# Patient Record
Sex: Female | Born: 1935
Health system: Southern US, Community
[De-identification: ages and names within clinical notes are randomized; demographics above are authoritative.]

## PROBLEM LIST (undated history)

## (undated) DIAGNOSIS — F32A Depression, unspecified: Secondary | ICD-10-CM

## (undated) DIAGNOSIS — I82409 Acute embolism and thrombosis of unspecified deep veins of unspecified lower extremity: Secondary | ICD-10-CM

## (undated) DIAGNOSIS — C229 Malignant neoplasm of liver, not specified as primary or secondary: Secondary | ICD-10-CM

## (undated) DIAGNOSIS — I959 Hypotension, unspecified: Secondary | ICD-10-CM

## (undated) DIAGNOSIS — E079 Disorder of thyroid, unspecified: Secondary | ICD-10-CM

## (undated) DIAGNOSIS — T4145XA Adverse effect of unspecified anesthetic, initial encounter: Secondary | ICD-10-CM

## (undated) DIAGNOSIS — F419 Anxiety disorder, unspecified: Secondary | ICD-10-CM

## (undated) DIAGNOSIS — G459 Transient cerebral ischemic attack, unspecified: Secondary | ICD-10-CM

## (undated) DIAGNOSIS — I351 Nonrheumatic aortic (valve) insufficiency: Secondary | ICD-10-CM

## (undated) DIAGNOSIS — I639 Cerebral infarction, unspecified: Secondary | ICD-10-CM

## (undated) DIAGNOSIS — E785 Hyperlipidemia, unspecified: Secondary | ICD-10-CM

## (undated) DIAGNOSIS — I319 Disease of pericardium, unspecified: Secondary | ICD-10-CM

## (undated) DIAGNOSIS — K317 Polyp of stomach and duodenum: Secondary | ICD-10-CM

## (undated) DIAGNOSIS — I2699 Other pulmonary embolism without acute cor pulmonale: Secondary | ICD-10-CM

## (undated) DIAGNOSIS — T8859XA Other complications of anesthesia, initial encounter: Secondary | ICD-10-CM

## (undated) DIAGNOSIS — Z8679 Personal history of other diseases of the circulatory system: Secondary | ICD-10-CM

## (undated) DIAGNOSIS — E7800 Pure hypercholesterolemia, unspecified: Secondary | ICD-10-CM

## (undated) DIAGNOSIS — R270 Ataxia, unspecified: Secondary | ICD-10-CM

## (undated) DIAGNOSIS — K146 Glossodynia: Secondary | ICD-10-CM

## (undated) DIAGNOSIS — C3490 Malignant neoplasm of unspecified part of unspecified bronchus or lung: Secondary | ICD-10-CM

## (undated) DIAGNOSIS — G40909 Epilepsy, unspecified, not intractable, without status epilepticus: Secondary | ICD-10-CM

## (undated) DIAGNOSIS — IMO0001 Reserved for inherently not codable concepts without codable children: Secondary | ICD-10-CM

## (undated) DIAGNOSIS — Z5111 Encounter for antineoplastic chemotherapy: Secondary | ICD-10-CM

## (undated) DIAGNOSIS — H269 Unspecified cataract: Secondary | ICD-10-CM

## (undated) DIAGNOSIS — S2220XA Unspecified fracture of sternum, initial encounter for closed fracture: Secondary | ICD-10-CM

## (undated) DIAGNOSIS — F329 Major depressive disorder, single episode, unspecified: Secondary | ICD-10-CM

## (undated) DIAGNOSIS — J9601 Acute respiratory failure with hypoxia: Secondary | ICD-10-CM

## (undated) DIAGNOSIS — C801 Malignant (primary) neoplasm, unspecified: Secondary | ICD-10-CM

## (undated) DIAGNOSIS — R001 Bradycardia, unspecified: Secondary | ICD-10-CM

## (undated) DIAGNOSIS — N390 Urinary tract infection, site not specified: Secondary | ICD-10-CM

## (undated) DIAGNOSIS — Z7189 Other specified counseling: Secondary | ICD-10-CM

## (undated) DIAGNOSIS — Z8774 Personal history of (corrected) congenital malformations of heart and circulatory system: Secondary | ICD-10-CM

## (undated) DIAGNOSIS — I480 Paroxysmal atrial fibrillation: Secondary | ICD-10-CM

## (undated) DIAGNOSIS — R269 Unspecified abnormalities of gait and mobility: Secondary | ICD-10-CM

## (undated) DIAGNOSIS — E039 Hypothyroidism, unspecified: Secondary | ICD-10-CM

## (undated) DIAGNOSIS — R569 Unspecified convulsions: Secondary | ICD-10-CM

## (undated) DIAGNOSIS — G319 Degenerative disease of nervous system, unspecified: Secondary | ICD-10-CM

## (undated) DIAGNOSIS — K589 Irritable bowel syndrome without diarrhea: Secondary | ICD-10-CM

## (undated) DIAGNOSIS — E669 Obesity, unspecified: Secondary | ICD-10-CM

## (undated) DIAGNOSIS — K219 Gastro-esophageal reflux disease without esophagitis: Secondary | ICD-10-CM

## (undated) DIAGNOSIS — J309 Allergic rhinitis, unspecified: Secondary | ICD-10-CM

## (undated) DIAGNOSIS — I495 Sick sinus syndrome: Secondary | ICD-10-CM

## (undated) DIAGNOSIS — M1990 Unspecified osteoarthritis, unspecified site: Secondary | ICD-10-CM

## (undated) HISTORY — DX: Paroxysmal atrial fibrillation: I48.0

## (undated) HISTORY — PX: APPENDECTOMY: SHX54

## (undated) HISTORY — DX: Allergic rhinitis, unspecified: J30.9

## (undated) HISTORY — DX: Major depressive disorder, single episode, unspecified: F32.9

## (undated) HISTORY — DX: Urinary tract infection, site not specified: N39.0

## (undated) HISTORY — DX: Gastro-esophageal reflux disease without esophagitis: K21.9

## (undated) HISTORY — DX: Disease of pericardium, unspecified: I31.9

## (undated) HISTORY — PX: COLONOSCOPY: SHX174

## (undated) HISTORY — DX: Unspecified cataract: H26.9

## (undated) HISTORY — DX: Encounter for antineoplastic chemotherapy: Z51.11

## (undated) HISTORY — DX: Depression, unspecified: F32.A

## (undated) HISTORY — DX: Cerebral infarction, unspecified: I63.9

## (undated) HISTORY — PX: UPPER GASTROINTESTINAL ENDOSCOPY: SHX188

## (undated) HISTORY — DX: Ataxia, unspecified: R27.0

## (undated) HISTORY — DX: Nonrheumatic aortic (valve) insufficiency: I35.1

## (undated) HISTORY — DX: Personal history of other diseases of the circulatory system: Z86.79

## (undated) HISTORY — DX: Other specified counseling: Z71.89

## (undated) HISTORY — DX: Malignant neoplasm of liver, not specified as primary or secondary: C22.9

## (undated) HISTORY — DX: Degenerative disease of nervous system, unspecified: G31.9

## (undated) HISTORY — DX: Unspecified convulsions: R56.9

## (undated) HISTORY — DX: Hyperlipidemia, unspecified: E78.5

## (undated) HISTORY — DX: Disorder of thyroid, unspecified: E07.9

## (undated) HISTORY — DX: Acute embolism and thrombosis of unspecified deep veins of unspecified lower extremity: I82.409

## (undated) HISTORY — DX: Malignant (primary) neoplasm, unspecified: C80.1

## (undated) HISTORY — DX: Unspecified osteoarthritis, unspecified site: M19.90

## (undated) HISTORY — DX: Other pulmonary embolism without acute cor pulmonale: I26.99

## (undated) HISTORY — DX: Anxiety disorder, unspecified: F41.9

## (undated) HISTORY — DX: Obesity, unspecified: E66.9

## (undated) HISTORY — DX: Unspecified fracture of sternum, initial encounter for closed fracture: S22.20XA

## (undated) HISTORY — DX: Malignant neoplasm of unspecified part of unspecified bronchus or lung: C34.90

## (undated) HISTORY — DX: Epilepsy, unspecified, not intractable, without status epilepticus: G40.909

## (undated) HISTORY — DX: Transient cerebral ischemic attack, unspecified: G45.9

## (undated) HISTORY — DX: Unspecified abnormalities of gait and mobility: R26.9

## (undated) HISTORY — DX: Hypotension, unspecified: I95.9

## (undated) HISTORY — PX: TOTAL ABDOMINAL HYSTERECTOMY: SHX209

## (undated) HISTORY — PX: OTHER SURGICAL HISTORY: SHX169

## (undated) HISTORY — PX: EYE SURGERY: SHX253

## (undated) HISTORY — DX: Bradycardia, unspecified: R00.1

## (undated) HISTORY — DX: Irritable bowel syndrome, unspecified: K58.9

## (undated) HISTORY — DX: Pure hypercholesterolemia, unspecified: E78.00

## (undated) HISTORY — PX: KNEE ARTHROSCOPY: SUR90

---

## 1999-11-10 ENCOUNTER — Encounter: Payer: Self-pay | Admitting: Emergency Medicine

## 1999-11-10 ENCOUNTER — Encounter: Payer: Self-pay | Admitting: Orthopedic Surgery

## 1999-11-10 ENCOUNTER — Inpatient Hospital Stay (HOSPITAL_COMMUNITY): Admission: EM | Admit: 1999-11-10 | Discharge: 1999-11-13 | Payer: Self-pay | Admitting: Emergency Medicine

## 1999-11-11 ENCOUNTER — Encounter: Payer: Self-pay | Admitting: Orthopedic Surgery

## 2001-07-30 DIAGNOSIS — R569 Unspecified convulsions: Secondary | ICD-10-CM

## 2001-07-30 HISTORY — DX: Unspecified convulsions: R56.9

## 2001-11-26 ENCOUNTER — Emergency Department (HOSPITAL_COMMUNITY): Admission: EM | Admit: 2001-11-26 | Discharge: 2001-11-26 | Payer: Self-pay | Admitting: Emergency Medicine

## 2001-11-28 ENCOUNTER — Ambulatory Visit (HOSPITAL_COMMUNITY): Admission: RE | Admit: 2001-11-28 | Discharge: 2001-11-28 | Payer: Self-pay | Admitting: *Deleted

## 2001-11-28 ENCOUNTER — Encounter: Payer: Self-pay | Admitting: *Deleted

## 2002-11-18 ENCOUNTER — Ambulatory Visit (HOSPITAL_COMMUNITY): Admission: RE | Admit: 2002-11-18 | Discharge: 2002-11-18 | Payer: Self-pay | Admitting: Family Medicine

## 2003-05-13 ENCOUNTER — Ambulatory Visit (HOSPITAL_COMMUNITY): Admission: RE | Admit: 2003-05-13 | Discharge: 2003-05-13 | Payer: Self-pay | Admitting: Family Medicine

## 2003-05-13 ENCOUNTER — Encounter: Payer: Self-pay | Admitting: Family Medicine

## 2003-05-28 ENCOUNTER — Emergency Department (HOSPITAL_COMMUNITY): Admission: EM | Admit: 2003-05-28 | Discharge: 2003-05-28 | Payer: Self-pay | Admitting: Emergency Medicine

## 2003-05-31 DIAGNOSIS — I639 Cerebral infarction, unspecified: Secondary | ICD-10-CM | POA: Insufficient documentation

## 2003-05-31 HISTORY — DX: Cerebral infarction, unspecified: I63.9

## 2003-06-03 ENCOUNTER — Ambulatory Visit (HOSPITAL_COMMUNITY): Admission: RE | Admit: 2003-06-03 | Discharge: 2003-06-03 | Payer: Self-pay | Admitting: Emergency Medicine

## 2003-06-21 ENCOUNTER — Ambulatory Visit (HOSPITAL_COMMUNITY): Admission: AD | Admit: 2003-06-21 | Discharge: 2003-06-21 | Payer: Self-pay | Admitting: Neurology

## 2003-09-23 ENCOUNTER — Inpatient Hospital Stay (HOSPITAL_COMMUNITY): Admission: EM | Admit: 2003-09-23 | Discharge: 2003-09-24 | Payer: Self-pay | Admitting: Emergency Medicine

## 2003-09-27 ENCOUNTER — Encounter: Admission: RE | Admit: 2003-09-27 | Discharge: 2003-09-27 | Payer: Self-pay | Admitting: Family Medicine

## 2004-06-16 ENCOUNTER — Ambulatory Visit: Payer: Self-pay | Admitting: Unknown Physician Specialty

## 2004-09-14 ENCOUNTER — Ambulatory Visit: Payer: Self-pay | Admitting: Internal Medicine

## 2004-09-25 ENCOUNTER — Ambulatory Visit: Payer: Self-pay | Admitting: Internal Medicine

## 2006-04-08 ENCOUNTER — Inpatient Hospital Stay (HOSPITAL_COMMUNITY): Admission: EM | Admit: 2006-04-08 | Discharge: 2006-04-14 | Payer: Self-pay | Admitting: Emergency Medicine

## 2006-06-24 ENCOUNTER — Ambulatory Visit: Payer: Self-pay | Admitting: Internal Medicine

## 2006-09-10 DIAGNOSIS — K219 Gastro-esophageal reflux disease without esophagitis: Secondary | ICD-10-CM | POA: Insufficient documentation

## 2006-09-10 DIAGNOSIS — J309 Allergic rhinitis, unspecified: Secondary | ICD-10-CM | POA: Insufficient documentation

## 2006-09-10 DIAGNOSIS — E785 Hyperlipidemia, unspecified: Secondary | ICD-10-CM

## 2006-09-10 DIAGNOSIS — I10 Essential (primary) hypertension: Secondary | ICD-10-CM

## 2006-10-09 ENCOUNTER — Ambulatory Visit: Payer: Self-pay | Admitting: Internal Medicine

## 2006-11-14 ENCOUNTER — Ambulatory Visit (HOSPITAL_COMMUNITY): Admission: RE | Admit: 2006-11-14 | Discharge: 2006-11-15 | Payer: Self-pay | Admitting: Orthopedic Surgery

## 2006-11-14 HISTORY — PX: KNEE ARTHROSCOPY: SHX127

## 2006-11-28 ENCOUNTER — Ambulatory Visit: Payer: Self-pay | Admitting: Vascular Surgery

## 2006-11-28 ENCOUNTER — Encounter: Payer: Self-pay | Admitting: Vascular Surgery

## 2006-11-28 ENCOUNTER — Ambulatory Visit (HOSPITAL_COMMUNITY): Admission: RE | Admit: 2006-11-28 | Discharge: 2006-11-28 | Payer: Self-pay | Admitting: Orthopedic Surgery

## 2006-12-16 ENCOUNTER — Encounter: Admission: RE | Admit: 2006-12-16 | Discharge: 2006-12-30 | Payer: Self-pay | Admitting: Orthopedic Surgery

## 2007-01-20 ENCOUNTER — Ambulatory Visit (HOSPITAL_COMMUNITY): Admission: RE | Admit: 2007-01-20 | Discharge: 2007-01-20 | Payer: Self-pay | Admitting: Family Medicine

## 2007-10-27 ENCOUNTER — Ambulatory Visit (HOSPITAL_BASED_OUTPATIENT_CLINIC_OR_DEPARTMENT_OTHER): Admission: RE | Admit: 2007-10-27 | Discharge: 2007-10-27 | Payer: Self-pay | Admitting: Orthopedic Surgery

## 2007-12-01 ENCOUNTER — Encounter: Admission: RE | Admit: 2007-12-01 | Discharge: 2008-02-03 | Payer: Self-pay | Admitting: Orthopedic Surgery

## 2008-02-13 ENCOUNTER — Telehealth: Payer: Self-pay | Admitting: Internal Medicine

## 2008-02-16 ENCOUNTER — Telehealth: Payer: Self-pay | Admitting: Internal Medicine

## 2008-02-18 ENCOUNTER — Ambulatory Visit: Payer: Self-pay | Admitting: Gastroenterology

## 2008-02-18 DIAGNOSIS — K589 Irritable bowel syndrome without diarrhea: Secondary | ICD-10-CM

## 2008-02-18 DIAGNOSIS — K644 Residual hemorrhoidal skin tags: Secondary | ICD-10-CM | POA: Insufficient documentation

## 2008-02-19 ENCOUNTER — Ambulatory Visit: Payer: Self-pay | Admitting: Internal Medicine

## 2008-02-19 LAB — CONVERTED CEMR LAB
Basophils Absolute: 0.2 10*3/uL — ABNORMAL HIGH (ref 0.0–0.1)
Basophils Relative: 3.6 % — ABNORMAL HIGH (ref 0.0–3.0)
Eosinophils Absolute: 0.3 10*3/uL (ref 0.0–0.7)
Eosinophils Relative: 6 % — ABNORMAL HIGH (ref 0.0–5.0)
HCT: 40.6 % (ref 36.0–46.0)
Hemoglobin: 13.8 g/dL (ref 12.0–15.0)
Lymphocytes Relative: 30.6 % (ref 12.0–46.0)
MCHC: 34 g/dL (ref 30.0–36.0)
MCV: 90.1 fL (ref 78.0–100.0)
Monocytes Absolute: 0.7 10*3/uL (ref 0.1–1.0)
Monocytes Relative: 12.7 % — ABNORMAL HIGH (ref 3.0–12.0)
Neutro Abs: 2.8 10*3/uL (ref 1.4–7.7)
Neutrophils Relative %: 47.1 % (ref 43.0–77.0)
Platelets: 244 10*3/uL (ref 150–400)
RBC: 4.5 M/uL (ref 3.87–5.11)
RDW: 13.3 % (ref 11.5–14.6)
WBC: 5.7 10*3/uL (ref 4.5–10.5)

## 2008-03-02 ENCOUNTER — Ambulatory Visit: Payer: Self-pay | Admitting: Internal Medicine

## 2008-03-03 ENCOUNTER — Telehealth: Payer: Self-pay | Admitting: Internal Medicine

## 2008-03-23 ENCOUNTER — Ambulatory Visit: Payer: Self-pay | Admitting: Internal Medicine

## 2008-04-06 ENCOUNTER — Encounter: Payer: Self-pay | Admitting: Internal Medicine

## 2008-04-06 ENCOUNTER — Ambulatory Visit: Payer: Self-pay | Admitting: Internal Medicine

## 2008-04-14 ENCOUNTER — Telehealth: Payer: Self-pay | Admitting: Internal Medicine

## 2009-12-16 ENCOUNTER — Encounter: Admission: RE | Admit: 2009-12-16 | Discharge: 2009-12-16 | Payer: Self-pay | Admitting: Family Medicine

## 2010-05-14 ENCOUNTER — Encounter: Admission: RE | Admit: 2010-05-14 | Discharge: 2010-05-14 | Payer: Self-pay | Admitting: Neurology

## 2010-11-21 ENCOUNTER — Ambulatory Visit: Payer: Medicare Other | Attending: Specialist | Admitting: Physical Therapy

## 2010-11-21 DIAGNOSIS — R5381 Other malaise: Secondary | ICD-10-CM | POA: Insufficient documentation

## 2010-11-21 DIAGNOSIS — M25569 Pain in unspecified knee: Secondary | ICD-10-CM | POA: Insufficient documentation

## 2010-11-21 DIAGNOSIS — M25669 Stiffness of unspecified knee, not elsewhere classified: Secondary | ICD-10-CM | POA: Insufficient documentation

## 2010-11-21 DIAGNOSIS — IMO0001 Reserved for inherently not codable concepts without codable children: Secondary | ICD-10-CM | POA: Insufficient documentation

## 2010-11-23 ENCOUNTER — Ambulatory Visit: Payer: Medicare Other | Admitting: *Deleted

## 2010-11-28 ENCOUNTER — Ambulatory Visit: Payer: Medicare Other | Admitting: Physical Therapy

## 2010-11-30 ENCOUNTER — Encounter: Admitting: Physical Therapy

## 2010-12-05 ENCOUNTER — Ambulatory Visit: Payer: Medicare Other | Attending: Specialist | Admitting: *Deleted

## 2010-12-05 DIAGNOSIS — IMO0001 Reserved for inherently not codable concepts without codable children: Secondary | ICD-10-CM | POA: Insufficient documentation

## 2010-12-05 DIAGNOSIS — R5381 Other malaise: Secondary | ICD-10-CM | POA: Insufficient documentation

## 2010-12-05 DIAGNOSIS — M25569 Pain in unspecified knee: Secondary | ICD-10-CM | POA: Insufficient documentation

## 2010-12-05 DIAGNOSIS — M25669 Stiffness of unspecified knee, not elsewhere classified: Secondary | ICD-10-CM | POA: Insufficient documentation

## 2010-12-08 ENCOUNTER — Encounter: Admitting: *Deleted

## 2010-12-09 ENCOUNTER — Emergency Department (HOSPITAL_COMMUNITY)
Admission: EM | Admit: 2010-12-09 | Discharge: 2010-12-09 | Disposition: A | Discharge status: Home / Self Care | Payer: Medicare Other | Attending: Emergency Medicine | Admitting: Emergency Medicine

## 2010-12-09 DIAGNOSIS — R569 Unspecified convulsions: Secondary | ICD-10-CM | POA: Insufficient documentation

## 2010-12-09 DIAGNOSIS — M7989 Other specified soft tissue disorders: Secondary | ICD-10-CM | POA: Insufficient documentation

## 2010-12-09 DIAGNOSIS — Z8673 Personal history of transient ischemic attack (TIA), and cerebral infarction without residual deficits: Secondary | ICD-10-CM | POA: Insufficient documentation

## 2010-12-09 DIAGNOSIS — M79609 Pain in unspecified limb: Secondary | ICD-10-CM | POA: Insufficient documentation

## 2010-12-12 NOTE — Op Note (Signed)
[name], [name]               ACCOUNT NO.:  [id number]   MEDICAL RECORD NO.:  [id number]          PATIENT TYPE:  AMB   LOCATION:  [facility]                         FACILITY:  [facility]   PHYSICIAN:  Marlowe Kays, M.D.  DATE OF BIRTH:  09/25/1933   DATE OF PROCEDURE:  10/27/2007  DATE OF DISCHARGE:                               OPERATIVE REPORT   PREOPERATIVE DIAGNOSIS:  Torn lateral meniscus, left knee.   POSTOPERATIVE DIAGNOSES:  1. Torn medial and lateral menisci, left knee  2. Osteoarthritis, left knee.   OPERATION:  Left knee arthroscopy with partial medial lateral  meniscectomy and shaving of medial femoral condyle.   SURGEON:  Dr. Simonne Come   ASSISTANT:  Nurse.   ANESTHESIA:  General.   PATHOLOGY/INDICATION FOR PROCEDURE:  She has had prior successful right  knee arthroscopic procedure.  She has had severe pain in the left knee  with an MRI demonstrating a complex tear lateral meniscus.  At surgery,  she was also found to have a substantial tear of the medial meniscus  involving the entire posterior third beginning at the posterior curve.  This was associated with some grade 2/4 chondromalacia of the medial  femoral condyle mainly posteriorly and significant scalloping of most of  the lateral tibial plateau with the lateral femoral condyle being  relatively spared.   PROCEDURE:  After satisfactory general anesthesia, Ace wrap and knee  support to right lower extremity, pneumatic tourniquet to left lower  extremity, left leg Esmarched out nonsterilely, thigh stabilizer  applied, and the leg prepped with DuraPrep from stabilizer to ankle and  draped in sterile field.  Time-out performed.  Superior medial saline  inflow.  First, through an anterolateral portal, medial compartment  joint was evaluated with the findings noted above.  I resected the torn  medial meniscus back to stable rim with a combination of baskets shaved  it down until smooth with a 3.5 shaver.  Final  picture was taken.  I  also debrided down the medial femoral condyle particularly on this  posterior portion and then reversed portals.  ACL was intact.  She had  severe maceration of the anterior third of the lateral meniscus which I  debrided out with a combination of arthroscopic scissors and 3.5 shaver  until I had better visualization.  She had tears of both the  intercondylar portion of the lateral meniscus, the posterior curve, and  a good bit of the anterior third.  I debrided back the posterior tears  with small baskets and then shaved down the entire lateral meniscus  until smooth.  Final pictures were taken.  Knee joint was irrigated  until clear, and all fluid possible was removed.  The two anterior  portals I closed with 4-0 nylon and then injected through the inflow  apparatus 20 mL 0.50% Marcaine with adrenaline and 4 mg of morphine.  This inflow apparatus I then removed,  and I closed this portal with 4-0 nylon as well.  Betadine Adaptic dry  dressing were applied.  Tourniquet was released.  She tolerated the  procedure well and was taken to recovery  room in satisfactory condition  with no known complications.           ______________________________  Marlowe Kays, M.D.     JA/MEDQ  D:  10/27/2007  T:  10/27/2007  Job:  [id number]

## 2010-12-15 NOTE — Op Note (Signed)
[name], PENDRY NO.:  [id number]   MEDICAL RECORD NO.:  [id number]          PATIENT TYPE:  OIB   LOCATION:  0098                         [facility]ACILITY:  [facility]   PHYSICIAN:  Marlowe Kays, M.D.  DATE OF BIRTH:  22-Oct-1933   DATE OF PROCEDURE:  11/14/2006  DATE OF DISCHARGE:                               OPERATIVE REPORT   PREOPERATIVE DIAGNOSIS:  1. Torn medial and lateral menisci.  2. Osteoarthritis of right knee.   POSTOPERATIVE DIAGNOSIS:  1. Torn medial and lateral menisci.  2. Osteoarthritis of right knee.   OPERATION:  Right knee arthroscopy with:  1. Partial medial and lateral meniscectomy.  2. Shaving of medial and lateral femoral condyles.   SURGEON:  Marlowe Kays, M.D.   ASSISTANT:  Nurse.   ANESTHESIA:  General.   JUSTIFICATION FOR PROCEDURE:  She has had chronic right knee pain,  worsening recently. Plain x-rays have demonstrated tricompartmental  degenerative changes but an MRI on June 11, 2006, has demonstrated  extensive tears involving both medial and lateral menisci.  The surgery  has been delayed because of irritable bowel problems requiring  antibiotic therapy.  When she was deemed ready for surgery by her  medical physician, Dr. Stan Head, who recommended prophylactic  antibiotics, she is here today and we will cover her with antibiotics  pre and postoperatively per Dr. Marvell Fuller recommendation and also keep  her in as an overnight evaluation for additional care.   PROCEDURE:  Prophylactic antibiotics.  She was given a knee block  initially by anesthesiologist, not wanting to put to sleep.  A pneumatic  tourniquet was applied but not inflated.  A thigh holder was applied to  the right leg. Ace wrap and knee support to the left lower extremity.  The right leg was prepped from the thigh support to ankle with DuraPrep  and draped in a sterile field. On trying to establish a superior and  medial portal, she did have some  pain and she was given a light  supplement to the knee block.  The operation from there on went  smoothly.   First, through an anterolateral portal, the medial compartment of the  knee joint was evaluated.  She had grade 3/4 chondromalacia of the  medial femoral condyle and some wear of the medial tibial plateau, as  well.  I debrided down the medial femoral condyle which allowed better  visualization.  The anterior portion of the medial meniscus I smoothed  down, it had been excoriated. Posteriorly, she had an extensive tear  particularly involving the posterior curve and just prior to it with a  little bit of wear over the posterior horn. I resected this back to a  stable rim with a combination of baskets and 3.5 shaver.   I then reversed portals laterally, visualization was poor initially, I  was able to use a 3.5 shaver to clear up some synovitis.  Her anterior  lateral meniscus was badly torn and I shaved this down. Her entire  meniscus was actually badly torn on further visualization posteriorly  and I debrided this down with  baskets and shaved down, again with 3.5  shaver, with the final meniscus rim being minimal, particularly in the  mid portion, but stable.  I then looked up in the lateral gutter and  suprapatellar area. She had some osteophytes from the patella but  nothing that was really shaveable.   The knee joint was then irrigated until clear. The portals were closed  with 4-0 nylon.  I then injected 20 mL of 0.5% Marcaine with adrenalin  through the inflow apparatus which was removed and this portal closed  with 4-0 nylon, as well.  A dry sterile dressing was applied.  She  tolerated the procedure well and was taken to recovery room in  satisfactory condition with no known complications.           ______________________________  Marlowe Kays, M.D.     JA/MEDQ  D:  11/14/2006  T:  11/15/2006  Job:  [id number]

## 2010-12-15 NOTE — H&P (Signed)
NAMEKYRIN, GARN NO.:  [id number]   MEDICAL RECORD NO.:  [id number]                   PATIENT TYPE:  INP   LOCATION:  3705                                 FACILITY:  [facility]   PHYSICIAN:  Nani Gasser, M.D.            DATE OF BIRTH:  18-Apr-1934   DATE OF ADMISSION:  09/23/2003  DATE OF DISCHARGE:  09/24/2003                                HISTORY & PHYSICAL   CHIEF COMPLAINT:  Larey Seat over while walking to the bathroom.   HISTORY OF PRESENT ILLNESS:  This is a 77 year old white female with a  history of right cerebellar infarct who had a single episode today for  approximately five minutes, followed by a contraction of the left arm.  The  patient apparently lost consciousness after hitting her head on the wall and  hit her tailbone on the floor.  She had approximately a two year history of  olfactory seizures associated with increased fatigue postictally.  She has  been seen by a neurologist and has never been on medications for this.  The  patient reports a decrease in her memory function recently, but denies any  personality changes, vocalized weaknesses or focalized decreased sensation.  The patient reports having an unsteady gait for approximately over a year.   PAST MEDICAL HISTORY:  1. CVA in November 2004.  She had a negative cerebral angiogram and no     carotid stenosis at that time.  She had an MRI in May 2003, showing     chronic infarction of the right cerebellum.  2. AVM on the left side of the face.  3. Hypercholesterolemia.  4. Questionable history of vaginal cysts removed 30 years ago.  5. History of chronic pericarditis.  6. History of heart murmur.   MEDICATIONS:  1. Elavil, unsure of dose.  2. Zantac p.r.n.  3. Aspirin 325 mg q.d.  4. Mevacor.   PAST SURGICAL HISTORY:  1. History of appendectomy.  2. History of hysterectomy.  3. History of left tibial and fibular internal fixation.   ALLERGIES:  No known drug  allergies.   SOCIAL HISTORY:  Lives in [place] with her husband, six children, eight  grandchildren and five great-grandchildren.  Denies any alcohol, tobacco or  drug use.  One of her daughters is present in the Emergency Department  today.   FAMILY HISTORY:  Father died at age 53 from coronary artery disease.  Mother  died at the age of 56.  Brother also died of coronary artery disease.  Has  one brother with diabetes.   REVIEW OF SYSTEMS:  Patient notes increasing eye blinking and worsening  short term memory.  She also notices more forgetfulness.  She denies any  personality changes.  She reports she feels dizzy all of the time and this  is not new.  She also reports decreased eye sight over the past year.  Denies any tinnitus or earache.  Has had a sore throat for the past week  with a head cold.  This a.m., she complained of pain with her breathing.  She denied any melanotic stools or bright red blood per rectum.  She does  have a positive history for constipation, but no dysuria, and denies any  abdominal pain currently.   VITAL SIGNS:  Temp 98.3, heart rate 74-85, blood pressure 140-143/62-63,  respirations 16-20.  O2 is 99% on room air.   PHYSICAL EXAMINATION:  GENERAL:  Alert and oriented x 3, in no acute  distress.  HEENT:  Head is atraumatic, normocephalic.  TMs are clear.  Oropharynx is  clear.  CARDIOVASCULAR:  Regular rate and rhythm with a question of a 2/6 systolic  murmur.  No rubs.  PULMONARY:  Clear to auscultation bilaterally.  No wheezes, no crackles, no  rhonchi.  ABDOMEN:  Positive tender in the left lower quadrant more so than the left  upper quadrant.  Moderately tender all over, though.  Positive bowel sounds,  nondistended.  EXTREMITIES:  No edema.  No cyanosis or clubbing.  LYMPH NODES:  No lymphadenopathy.  SKIN:  No rashes.  MUSCULOSKELETAL:  Full range of motion grossly.  NEURO:  Decreased sensation in the right mid cheek, otherwise cranial  nerves  II-XII are intact grossly and deep tendon reflexes 2+ bilaterally.  Strength  5/5 bilaterally.   LABORATORIES:  Sodium 143, potassium 3.7, chloride 111, bicarb 21, BUN 7,  creatinine 0.8, glucose 90, hemoglobin 15, hematocrit 44.  EKG is normal  sinus rhythm.  Chest x-ray:  No acute disease.  Head CT shows no acute  abnormalities.   AXIS OF COMPLAINTS:  This is 77 year old white female with a history of CVA  and olfactory seizures, admitted with syncope and a fall after hitting her  head and tailbone.   PROBLEM #1 - SYNCOPE:  Etiology is unclear at this point.  EKG appears  normal.  I must consider arrhythmias, so we will keep her on telemetry  overnight.  Consider anticholinergic effects of medication as a possibility  or vasovagal, but this is most likely, given her history.  Electrolytes and  glucose appear normal.  Also consider that this syncope may be secondary to  seizures, given her past history and the brief left arm contracture after  the fall. Stroke is much less likely with a negative CT.  The patient's  cerebellar function did reveal a positive Romberg and some difficulty with  walking, but this is also consistent with her old cerebellar infarct back in  May 2003 on MRI.  We will observe and check a chest x-ray and check  __________ in the morning.   PROBLEM #2 -  MUSCULOSKELETAL.  Will continue to follow for any evidence of  fracture.   PROBLEM #3 - NEURO:  Consider the possibility of seizures.  Patient with a  past history of neurologic workup by Neurology here in [place].  Will  plan to obtain a Neuro consult and appreciate their assistance and  consideration in helping with and especially starting this patient on  seizure medications.                                                Nani Gasser, M.D.    CM/MEDQ  D:  10/20/2003  T:  10/21/2003  Job:  [id number]  cc:  [facility]  [address]  [place], [place]  [address]

## 2010-12-15 NOTE — Procedures (Signed)
CLINICAL HISTORY:  The patient is a 77 year old woman who had a syncopal  episode twice without warning.  This has happened in the past.   PROCEDURE:  The procedure is carried out on a 32-channel digital Cadwell  recorder reformatted into 16-channel montages with one devoted to EKG.  The  patient was awake during the recording.  She takes aspirin, Vicodin, Pepcid,  Zocor.  The international 10-20 system lead placement was used.   DESCRIPTION OF FINDINGS:  The background is a very low voltage mixture of 8-  9 Hz alpha range activity and beta range components.  Occasional rhythmic  beta range components are seen.   The record shows some drowsiness with brief episodes of more generalized  theta range activity.  The patient was aroused for intermittent photic  stimulation which induced a driving response from 11 Hz up to 17 Hz.   There was no focal slowing of the background.  There was no interictal  epileptiform activity in the form of spikes or sharp waves.  Toward the end  of the record the patient hyperventilated with no significant change in  background activity.  EKG showed a regular sinus rhythm with ventricular  response of 66 beats per minute.   IMPRESSION:  Normal record with the patient awake and drowsy.    WILLIAM H. Sharene Skeans, M.D.   AOZ:HYQM  D:  09/24/2003 10:55:27  T:  09/24/2003 11:23:30  Job #:  [id number]   cc:   Melvyn Novas, M.D.  [address]  [address]  [place]  [place] [address]  Fax: [phone]

## 2010-12-15 NOTE — Discharge Summary (Signed)
[name], [name]               ACCOUNT NO.:  [id number]   MEDICAL RECORD NO.:  [id number]          PATIENT TYPE:  INP   LOCATION:  5736                         FACILITY:  [facility]   PHYSICIAN:  Melissa L. Ladona Ridgel, MD  DATE OF BIRTH:  Dec 06, 1933   DATE OF ADMISSION:  04/08/2006  DATE OF DISCHARGE:  04/14/2006                                 DISCHARGE SUMMARY   CHIEF COMPLAINT ON ADMISSION:  Fever, hallucinations, headaches, and body  aches.   DISCHARGE DIAGNOSES:  1. Urinary tract infection.  The patient was started initially on Cipro      intravenously q.12h.  A urinalysis was obtained, which showed coagulase-      negative Staphylococcus at about 6000 colony-forming units.  Because of      the diagnosis of diarrhea, which ended up being Clostridium difficile,      it was felt that the patient's fever and white count elevations were      related to Clostridium difficile colitis, and therefore with the      finding of only 6000 colony-forming units of coagulase-negative      Staphylococcus, the patient's Cipro was discontinued.  She was,      however, maintained on Flagyl for her Clostridium difficile infection.      The patient during the course of the hospital stay had difficulty with      a voiding trial.  Evidently the patient has a prolapsed bladder, which      impedes her urinary output.  The patient relates several weeks to      months of inability to completely and totally empty her urine from her      bladder.  She requires positional changes to assist with this but never      feels completely empty.  During the stay, her Foley was discontinued,      and 1400 ml of urine were drained after in-and-out catheterization when      the patient could not void.  I have spoken with the [facility],      who agreed to assign her to a physician.  Dr. Vonita Moss may or may not      be available, and therefore one of his colleagues would need to see      this patient in the outpatient  setting.  She will go home with a      urinary catheter and Home Healthcare to assist her with the care of the      catheter until she can be seen in the outpatient setting.  2. Seizure disorder.  The patient was seen and evaluated by Neurology      because of the hallucinations and an MRI was completed, which shows no      obvious acute infarction or intracranial abnormality.  She has an old      right cerebellar infarct, and either infarct or injury to the left      aspect of the splenium of the corpus callosum, which is unchanged from      previous studies.  She has slight progression in the white  matter      changes in her brain.  The patient's olfactory hallucinations were felt      to be secondary to possible infection and/or her previous history of      seizures.  She was therefore continued on her Gabapentin, and no other      recommendations were made by Neurology.  3. Depression.  The patient was maintained on her Amitriptyline and      Lexapro.  4. Hyperlipidemia.  The patient was maintained on her Lovastatin.  5. Clostridium difficile colitis.  The patient remains with soft stools,      which have become more formed and less frequent on Flagyl 500 p.o.      t.i.d.  This will be maintained for another two weeks of therapy.  She      should follow up mid week with her primary care physician with regard      to checking her progress and to evaluating laboratory values for      evaluation of her potassium.  Her last potassium was documented on      September 12th at 3.9, and she has not had any subsequent values      measured.  6. Cough with atelectasis.  The patient had developed some thick, mucoid      material in the back of her throat as well as cough.  A chest x-ray was      obtained, which showed significant atelectasis at the right base.  The      patient responded to the addition of Mucinex and incentive spirometry,      which I will continue to recommend for her at home.   Should she develop      any fever or chills, worsening cough, or shortness of breath, she      should follow up with her primary care physician for further      evaluation.  7. Headache.  Please see the above-neurological evaluation description.      At the time of discharge, her headache has resolved.  It felt to be      secondary to her underlying infection.   MEDICATIONS ON DISCHARGE:  1. Amitriptyline 50 mg q.h.s.  2. Gabapentin 300 mg four times daily.  3. Lexapro 20 mg once daily.  4. Lovastatin 20 mg at bedtime.  5. Zantac 150 mg twice daily.  6. Flagyl 500 mg t.i.d. for two more weeks.  7. Mucinex 600 mg twice daily.   HOSPITAL COURSE/HISTORY OF PRESENT ILLNESS:  The patient is a pleasant, 77-  year-old, Caucasian female looking younger than her stated age presented to  the emergency room on April 08, 2006, with the complaint of olfactory  hallucinations, fever, headaches, and body aches.  The patient was noted to  have been diagnosed with a urinary tract infection and started on  antibiotics as an outpatient.  It was felt that she was continuing to have  difficulty with the urinary tract infection, and therefore was admitted to  the hospital and restarted on antibiotic therapy.  The patient was noted to  have diarrhea, and therefore C. diff cultures were sent.  She was found to  be C. diff positive and therefore was started on Flagyl.  Of note, the  patient underwent imaging of her abdomen because of abdominal pain.  An  acute abdominal series showed no acute abdominal pathology.  She has  persistent low lung volumes.  A renal ultrasound was also completed, which  showed no hydronephrosis.  The patient subsequently continued on antibiotic  therapy and was evaluated for her persistent headache by Dr. Avie Echevaria, and  it was felt that her headache was likely related to her infections, and with the negative MRI, no further recommendations were made other than to  continue  her treatment for her seizure disorder.  The patient's hospital  course was complicated by anorexia, which was significantly improved on the  day of discharge.  The patient has been able to eat home meals and take  supplements.  On the day of discharge, the patient's number of stools is two  and is becoming more formed.  I have instructed her and her family on the  isolation of bodily fluids to prevent possible infection from other members  of the household.  She will discharge to home on Flagyl oral therapy and  follow up with her primary care physician.   The patient's hospital course was also complicated by urinary retention.  Evidently the patient has been having difficulty as an outpatient emptying  her bladder.  It was noted on catheter placement that the patient likely has  a prolapsed bladder and therefore will need outpatient evaluation.  I did  discuss the case with Dr. Larey Dresser, who will see the patient in the  outpatient office or have one of his partners do so.  For now, the Foley  catheter will remain in place, and we will have Home Health assist the  patient with the care of the catheter.   DISCHARGE PHYSICAL EXAMINATION:  VITAL SIGNS:  On the day of discharge, the  patient's vital signs are stable, she is afebrile, temperature is 98.4,  blood pressure 134/70, pulse 75, respirations 18, saturation is 95%.  GENERAL:  This is a well-developed, mildly obese, white female in no acute  distress.  She looks much improved today with increased strength and  appetite.  HEENT:  She is normocephalic, atraumatic.  Pupils equal, round, and reactive  to light.  Extraocular muscles are intact.  Mucous membranes are moist.  NECK:  Supple, there is no JVD, no lymph nodes, no carotid bruits.  CHEST:  With improved air entry, decreased at the bases but better air  movement.  ABDOMEN:  Obese, nontender, nondistended, with positive bowel sounds.  EXTREMITIES:  No edema.  NEUROLOGIC:   She is awake, alert, and oriented.  Cranial nerves II-XII  appear to be intact.   PERTINENT LABORATORY VALUES:  A repeat urinalysis has grown no bacterial  species.  She did, however, have 60,000 of coagulase-negative Staph earlier  in the hospital.  The course of her cultures have been negative, her ova and  parasites have been negative.  Her C. diff cultures were positive as of the  11th, and a repeat culture is in process in the hospital laboratory.  The  patient's last white count was 11.0 on September 14th with a hemoglobin of  12.3, hematocrit 36.6, and platelets of 254.   At this time, the patient is deemed stable for discharge to home, to follow  up with her primary care physician, and to make an appointment to see Dr.  Vonita Moss, et. al, [facility] for further evaluation of her  possible rectocele versus cystocele with urinary retention.  On the day of discharge, the patient's condition is stable.  Disposition is  to home to follow up with Dr. Dimple Casey.      Melissa L. Ladona Ridgel, MD  Electronically Signed  MLT/MEDQ  D:  04/14/2006  T:  04/14/2006  Job:  [id number]   cc:   Magnus Sinning. Rice, M.D.  [facility]  [facility] J. Vonita Moss, M.D.  Genene Churn. Love, M.D.

## 2010-12-15 NOTE — Consult Note (Signed)
[name], [name] NO.:  [id number]   MEDICAL RECORD NO.:  [id number]                   PATIENT TYPE:  INP   LOCATION:  3705                                 FACILITY:  [facility]   PHYSICIAN:  Melvyn Novas, M.D.               DATE OF BIRTH:  July 16, 1934   DATE OF CONSULTATION:  09/24/2003  DATE OF DISCHARGE:  09/24/2003                                   CONSULTATION   The. Patient is a 77 year old Caucasian, right-handed lady who has a history  of spells that announce themselves with olfactory abnormalities.  She feels  a burned or foul smell before having an episode that appears to be  presyncope, a fear of fainting rather than that she ever really truly  fainted in the past.  She had been seen by Dr. Noreene Filbert three years ago and  by Dr. Orlin Hilding over the year 2004 and was given samples of anticonvulsants  that she decided not to take.  She also never refilled prescriptions.  She  had been worked up with an MRI outpatient and inpatient in the hospital  showing no abnormalities except \\small  vessel disease.  In response to  this, was put on Zocor and aspirin.  She has presented with a full syncope  but did not complain about any convulsion or associated postictum.  ICD code  780.2, 780.02, 345.10.   SOCIAL HISTORY:  The patient is retired.  She lives in [place] with her  husband, has six children and healthy grandchildren and great grandchildren.  She denies any alcohol use, tobacco, drug, or cocaine use.  The patient's  family was at the bedside yesterday during her admission.   FAMILY HISTORY:  The patient's father died of a coronary artery disease  related event.  The mother died at 52 in good health.  The patient has female  siblings that suffer from coronary artery disease and diabetes.   REVIEW OF SYSTEMS:  Again, the patient endures smells or olfactory  hallucinations followed by a sense of weakness, feeling like she might pass  out, lasting  irregularly 5 minutes to 1 day, which is unlikely to be a  seizure-related illness.  She does have tongue burning and tongue taste  abnormalities, too.  She denies tinnitus, headaches, or neck stiffness.   MEDICATIONS:  Today, aspirin, Vicodin, Tylenol, Pepcid, Zocor.   LABORATORY DATA:  Hypokalemia, low CO2, elevated but  within normal limits.  Hemoglobin and hematocrit, and low BUN, indicating the patient might be  dehydrated.   PHYSICAL EXAMINATION:  VITAL SIGNS: Temperature 97.7, blood pressure 100/70,  respiratory rate 22, pulse 64 and regular.  GENERAL: The patient appears alert and oriented x 3, has well perfused  mucous membranes.  Atraumatic and normocephalic, pleasant and cooperative.  NEUROLOGIC:  Cranial nerve exam: Full visual fields.  No papiledema, no  focal motor deficits.  Cranial nerves are intact with normal extraocular and  optokinetic responses.  Gag is intact.  Tongue and uvula move midline.  There is no sensory loss and no extension of primary modalities.  The  patient had no ataxia, dysmetria, or tremor on finger-to-nose test, and can  walk without assistance.  Deep tendon reflexes are equal no Babinski.   PLAN:  We have to perform an EEG today to see if there is any evidence of  psychomotor seizures.  Given MRIs were multiple times negative in the past,  I do not think we need a repeat study.  I like Dr. Martin Majestic suggestion of  trying to use an anticonvulsant that also could treat dysesthesias.  I would  suggest Gabapentin, Keppra, or Trileptal.  Gabapentin can be used as a  fairly large therapeutic window without causing any serious side effects and  is now generically available so that is probably a very good choice.  The  patient also needs to be told to keep her oral fluid intake up.  She does  with her diet show mild hypokalemia.  We should probably encourage the  intake of potassium-rich foods, magnesium-rich foods.   EEG pending.  My colleague, Dr.  Sharene Skeans, will read EEGs today.  Preliminary  report will be faxed to the floor.                                               Melvyn Novas, M.D.    CD/MEDQ  D:  09/24/2003  T:  09/24/2003  Job:  [id number]   cc:   Deniece Portela A. Sheffield Slider, M.D.  Fax: [phone]   Gustavus Messing. Orlin Hilding, M.D.  [address]  [address]  [place]  [place] [address]  Fax: [phone]

## 2010-12-15 NOTE — Discharge Summary (Signed)
NAMENOZOMI, [name]                           ACCOUNT NO.:  [id number]   MEDICAL RECORD NO.:  [id number]                   PATIENT TYPE:  INP   LOCATION:  3705                                 FACILITY:  [facility]   PHYSICIAN:  Wayne A. Sheffield Slider, M.D.                 DATE OF BIRTH:  1933-12-25   DATE OF ADMISSION:  09/23/2003  DATE OF DISCHARGE:  09/24/2003                                 DISCHARGE SUMMARY   DISCHARGE DIAGNOSES:  1. Recurrent syncope episodes, ?secondary to seizure.  2. Hypokalemia.  3. History of cerebrovascular accident in November of 2004, status post     negative cerebral angiogram.  4. History of arteriovenous malformation on the left side of the face.  5. History of hypercholesterolemia.  6. History of chronic pericarditis.  7. History of a heart murmur.  8. History of questionable vaginal cyst removed 30 years ago.   DISCHARGE MEDICATIONS:  1. Elavil -- continue has previously prescribed.  2. Aspirin 325 mg 1 p.o. daily.  3. Mevacor -- continue as previously prescribed.  4. Zantac 1 p.o. daily p.r.n.  5. Neurontin 1 tab on day 1, one tab twice a day on day 2, then 1 tab 3     times a day on day 3 and continue with 1 tab 3 times a day until seen by     her physician.   PAIN MANAGEMENT:  Vicodin 1 tab every 4 hours as needed p.r.n. for back  pain, do not take with Tylenol, and ibuprofen 600 mg q.6 h. p.r.n. for pain.   ACTIVITY:  The patient is restricted to not driving.   SPECIAL INSTRUCTIONS:  Followup for [facility] for further cardiac  evaluation and results are to be sent to [facility].   FOLLOWUP:  Follow up:  1. With Dr. Birdena Jubilee, October 01, 2003 at 10:15 a.m., at [facility].  2. With Dr. Gustavus Messing. Orlin Hilding, October 19, 2003 at 8:45 a.m., at [facility]     Neurology.   HOSPITAL COURSE:  [name] is a 77 year old white female with a known  history of right cerebellar infarct, who presented with a complaint of  a  syncopal episode that she thinks lasted for approximately 5 minutes.  The  patient also reported a known history of olfactory seizures and reports that  she had been seen by a neurologist the past but did not want to take any of  the medications that they had recommended at that time.  The following  problems were addressed during the admission:   PROBLEM #1 - SYNCOPE:  EKG was normal.  We kept her on telemetry overnight,  which did not show any events.  A neurology consult was called and the  patient was seen by Dr. Orlin Hilding, who made several recommendations including  a trial of Trileptal or Keppra, since it was felt  that the patient's episode  and questionable falls may be part of her olfactory seizures.  She also  felt gabapentin would be a good alternative as well, thus, after much  discussion with the patient, she was willing to try Neurontin.  She also has  a history of paresthesias of the tongue and hopefully the Neurontin would  also help with this.  We did set the patient up to get a [facility]  monitor after discharge for further cardiac evaluation as an outpatient and  on the 25th, the patient did get an EEG performed by the neurologist, which  was reportedly normal.   PROBLEM #2 - HYPOKALEMIA:  The patient's potassium was 3.2 and this was  replaced before discharge and should be checked again as an outpatient.   PROBLEM #3 - HISTORY OF CVA:  No new changes.  The patient was continued on  her aspirin and Mevacor.   DISPOSITION:  The patient was discharged on the 25th with no repeat of her  episode and doing quite well and the plan was for her to follow up with  neurology and with her primary care [name].      Nani Gasser, M.D.                  Wayne A. Sheffield Slider, M.D.    CM/MEDQ  D:  12/02/2003  T:  12/03/2003  Job:  [id number]   cc:   [facility] Birdena Jubilee M.D.   Catherine A. Orlin Hilding, M.D.  [address]  [address]   [place]  [place] [address]  Fax: [phone]

## 2010-12-15 NOTE — Assessment & Plan Note (Signed)
[facility]                           GASTROENTEROLOGY OFFICE NOTE   [name], [name]                        MRN:          [id number]  DATE:06/24/2006                            DOB:          1933-08-11    CHIEF COMPLAINT:  Diarrhea due to Clostridium difficile.   ASSESSMENT:  Recurrent C. difficile diarrhea or persistent C. difficile  diarrhea not responsive to weeks of metronidazole therapy.   PLAN:  1. Vancomycin 250 mg four times a day for 2 weeks.  2. Florastor 250 mg two pills twice a day for a month.  3. Discontinue metronidazole.  4. If the vancomycin does not give her a durable response, then she will      most likely need a taper over 6 weeks versus possible Xifaxan therapy,      though I would favor the taper.  She might need an investigative study      such as a sigmoidoscopy, but we will see how it goes.  I have      instructed her to call me if this therapy does not work.   HISTORY:  A 77 year old white woman that received some antibiotics which  sounds like she received for a Bartholin cyst or some type of abscess around  her vaginal area.  That was up in [place].  Subsequent to that she became ill  with diarrhea and hypotension and was admitted to the hospital in  [place].  She was found to be Clostridium difficile positive and was  treated with metronidazole.  She received Cipro when she was hospitalized in  September from the 10th to the 16th.  She had some altered mental status as  well.  Post hospitalization she finished a course of metronidazole and had  severe recurrence of black, watery diarrhea with vomiting.  She cannot make  it for more than a few days off of metronidazole before she has problems  again.  She has crampy abdominal pain.  She had fever when she was  originally hospitalized.  Her appetite has been off.  She does not describe  bleeding.   PAST MEDICAL HISTORY:  1. Screening colonoscopy September 25, 2004, showing external hemorrhoids      with good prep.  2. C. difficile.  3. Depression.  4. Osteoarthritis.  5. Prior hysterectomy.  6. Prior appendectomy.  7. Seizure disorder.  8. Bladder prolapse.  9. Headaches.  10.Urinary tract infection.   MEDICATIONS:  1. Lexapro 10 mg daily.  2. Elavil 20 mg daily.  3. Zantac 150 mg b.i.d.  4. Mevacor 20 mg daily.  5. Aspirin 325 mg daily.  6. Aleve one each day.  7. Metronidazole 500 mg t.i.d.  8. Gabapentin 300 mg four a day.   DRUG ALLERGIES:  None known.   SOCIAL HISTORY:  She is married, lives with her husband, two sons, four  daughters.  No alcohol, tobacco or drugs.   FAMILY HISTORY:  Reflected in my medical history form, is noncontributory.   REVIEW OF SYSTEMS:  See medical history form for full details.   PHYSICAL EXAMINATION:  GENERAL:  Reveals a pleasant, elderly white woman.  VITAL SIGNS:  Weight 199 pounds, height 5 feet 4 inches, blood pressure  116/78, pulse 68.  EYES:  Anicteric.  ENT:  Normal mouth and posterior pharynx.  NECK:  Supple, no thyromegaly or mass.  CHEST:  Clear.  HEART:  S1, S2.  No murmurs, rubs, or gallops.  ABDOMEN:  Mildly tender diffusely with bowel sounds present.  No  organomegaly of mass.  LYMPHATIC:  No neck or supraclavicular nodes.  SKIN:  Warm and dry, no acute rash.  PSYCHIATRIC:  She is alert and oriented x3.  She does get a little bit  tearful during the discussion as she is tired of being ill, she tells me.   I have reviewed office notes sent by Dr. Dimple Casey, as well as hospital records.   I appreciate the opportunity to care for this patient.     Iva Boop, MD,FACG  Electronically Signed    CEG/MedQ  DD: 06/24/2006  DT: 06/24/2006  Job #: [id number]   cc:   Magnus Sinning. Rice, M.D.

## 2010-12-15 NOTE — Assessment & Plan Note (Signed)
[facility]                           GASTROENTEROLOGY OFFICE NOTE   NAME:DAVISJonell, Brumbaugh                        MRN:          [id number]  DATE:06/24/2006                            DOB:          10/12/1933    ADDENDUM:  1. Additional medical history includes gastroesophageal reflux disease,      facial AVN malformation with neuralgia.  2. Irritable bowel syndrome.  3. Osteopenia.  4. Possible history of deep vein thrombosis years ago.  5. Remote history of GI bleeding secondary to AV malformation, exact site      not known.  6. Questionable history of chronic pericarditis.  7. Displaced femur fracture.  8. Wrist repair.  9. Left ovarian cyst removal.     Iva Boop, MD,FACG  Electronically Signed    CEG/MedQ  DD: 06/24/2006  DT: 06/24/2006  Job #: [id number]

## 2010-12-15 NOTE — Consult Note (Signed)
NAMEODESSIA, ASLESON NO.:  [id number]   MEDICAL RECORD NO.:  [id number]          PATIENT TYPE:  INP   LOCATION:  5736                         FACILITY:  [facility]   PHYSICIAN:  Genene Churn. Love, M.D.    DATE OF BIRTH:  [birth year]   DATE OF CONSULTATION:  04/10/2006  DATE OF DISCHARGE:                                   CONSULTATION   This 77 year old right-handed white married female from [place], [place], is seen at the request of Dr. Arthor Captain for evaluation of headache.   HISTORY OF PRESENT ILLNESS:  Mrs. Dunlow has a 3- to 4-year history of a foul  odor such as rotten meat for which she has been evaluated by Dr. Orlin Hilding and  Dr. Porfirio Mylar Dohmeier.  It has suspected that she may have simple possible  partial seizures but EEG in the past has been unremarkable.  She has had  MRIs of the brain in the past which have shown evidence of bicerebral small-  vessel ischemic disease and a right cerebellar stroke but no evidence of an  acute stroke and the MRIs have been stable over the years.  The first MRI  study of the brain was Nov 28, 2001.  Last MRI study of the brain was  June 03, 2003.  She has a known history of a right facial arteriovenous  malformation and has undergone a cerebral arteriogram in June 01, 2003,  by Dr. Corliss Skains which has shown negative for carotid stenosis, no vascular  malformation identified, and most likely consideration of MRI of the neck  would be helpful for identification of the AVM.  She had been tried on  Neurontin which she says if she takes on a regular basis at 300 mg q.i.d.  she does not have the olfactory hallucinations.  She has on several  occasions had syncopal episodes.  Most of them have been associated with no  tonic-clonic activity but her husband reports one in which she had  stiffening of her right arm.  She was admitted to [facility] on  April 08, 2006, having awakened that morning with her olfactory  hallucination.  This was followed by diffuse bifrontal headache as well as  headache behind her eyes.  There was no associated seizure.  She came to the  emergency room where she was found to have an elevated temperature of 101.8  degrees and a urinary tract infection.  Her hospital course has been  complicated by the development C. difficile.  She has complained of  headaches but her headaches have markedly improved since admission.  There  has been no associated visual disturbance, nausea and vomiting, or focal arm  or leg weakness or numbness with her headaches.  She does not have a history  of headaches according to the patient.   PAST MEDICAL HISTORY:  Is significant for the right facial AVM, a history of  olfactory hallucinations thought to represent simple partial seizures,  simple partial seizures, syncopal episodes in the past, episode of left-body  numbness in the past, recent urinary tract infection, and C. difficile.  Her laboratory data hospital has revealed elevated white blood cell count of  20,600; hemoglobin of 13.0; hematocrit of 39.1; platelet count 220,000 on  April 10, 2006.  Previously, her white blood cell counts were 21,500 and  20,300.  On April 10, 2006, blood studies have shown a sodium of 135,  potassium 3.9, chloride 109, CO2 content 23, glucose 160, BUN 9, creatinine  0.8, and calcium of 9.1.  Urinalysis this time of admission revealed 7-10  white blood cells, now reveals 3-6 white blood cells.   IMPRESSION:  1. Headaches, code 284.0, most likely representing toxic headaches related      to her underlying fever.  2. History of olfactory hallucinations, most likely representing simple      partial seizures, code 345.50.  3. History of syncope versus seizure, code 780.3 versus 345.10.  4. Urinary tract infection at the time of admission, code 509.01.  5. Clostridium difficile.  6. Old facial right arteriovenous malformation.   The plan at this  time is to recommend MRI for further evaluation of her  headaches.           ______________________________  Genene Churn. Sandria Manly, M.D.     JML/MEDQ  D:  04/10/2006  T:  04/11/2006  Job:  [id number]

## 2010-12-15 NOTE — H&P (Signed)
[name], LIPS NO.:  [id number]   MEDICAL RECORD NO.:  [id number]          PATIENT TYPE:  INP   LOCATION:  5736                         FACILITY:  [facility]   PHYSICIAN:  Michelene Gardener, MD    DATE OF BIRTH:  Dec 13, 1933   DATE OF ADMISSION:  04/08/2006  DATE OF DISCHARGE:                                HISTORY & PHYSICAL   PRIMARY CARE PHYSICIAN:  The patient is unassigned.   CHIEF COMPLAINT:  Fever, hallucinations, headaches, and body aches.   HISTORY OF PRESENT ILLNESS:  This is a 77 year old Caucasian female, past  medical history of multiple medical problems, presented with above-mentioned  complaints.  The patient stated that yesterday she was having  hallucinations.  She was also complaining of headache, increasing body aches  all over. She was having some fever, but she did not measure her temperature  at home.  Today, her symptoms got worse, and she decided to come to the ER.   In the emergency room, her vital signs showed temperature 102.3, blood  pressure 109/59, pulse 84, respiratory rate 20.  Urinalysis was positive for  UTI, and [facility] were called for further evaluation.   PAST MEDICAL HISTORY:  Significant for:  1. History of seizure disorder.  2. History of transient ischemic attack.  3. Depression.   PAST SURGICAL HISTORY:  Denies.   MEDICATIONS:  1. Amitriptyline 50 mg p.o. once daily.  2. Gabapentin 300 mg 4 times daily.  3. Lexapro 10 mg p.o. once daily.  4. Lovastatin 20 mg p.o. once daily.   SOCIAL HISTORY:  The patient denies smoking. She drinks alcohol  occasionally.  Denies recreational drugs.   FAMILY HISTORY:  Her father died at age 48 because of massive heart attack.  One of her brothers also died at age of 24 because of heart problems.  Two  of her other brothers have coronary artery disease, and one of her brother  has diabetes mellitus.  Her mother died in her 90s, he says only with aging  problems.   REVIEW OF SYSTEMS:  CONSTITUTIONAL:  Positive for fever, body aches.  There  are no weight changes.  EYES: No blurred vision, no pain.  ENT: No tinnitus,  no difficulty swallowing, no epistaxis.  CARDIOVASCULAR: No chest pain, no  shortness of breath, no palpitations.  RESPIRATORY: No cough, no wheeze, no  hemoptysis.  GI: Positive for nausea.  There is no vomiting, and there is no  abdominal pain. MUSCULOSKELETAL: Positive for multiple muscular pain and  body aches.  GU: No dysuria, no hematuria. ENDOCRINE: No polyuria, no  nocturia. HEMATOLOGY: No bruises, no bleeding.  INFECTIOUS DISEASE: No rash,  no lesions. NEUROLOGIC: No numbness or tingling.   PHYSICAL EXAMINATION:  VITAL SIGNS: Temperature 101.8, blood pressure  127/67, pulse 102, respiratory rate 22.  GENERAL APPEARANCE:  This is an elderly Caucasian female lying in bed in no  acute distress at the present time.  HEENT:  Clinically normal with no erythema.  Pupils equal, round, and  reactive to light and accommodation.  There are no glasses.  Hearing was  mildly diminished.  There is no ear discharge or bleeding.  Oral mucosa is  dry, and there is no pharyngeal erythema.  NECK: Supple.  No JVD, no carotid bruit.  No thyroid enlargement or thyroid  tenderness.  CARDIOVASCULAR:  S1 and S2 regular.  There is positive systolic murmur in  the mitral area.  There is no gallop, and there are no thrills.  RESPIRATORY:  The patient is breathing between 16 to 18, no use of accessory  muscles. No intercostal retractions.  On palpation, negative for tactile  vocal fremitus.  Auscultation showed normal breath sounds.  There is no  rhonchi, no wheezes.  ABDOMEN: Soft, nondistended.  There is mild suprapubic tenderness, and there  is no hepatosplenomegaly. Bowel sounds are normal.  Umbilicus central.  EXTREMITIES:  No edema, no rash, and no varicose veins.  SKIN:  No rash and no erythema.  NEUROLOGIC: Cranial nerves are intact.  There are no  motor or sensory  deficits.   LABORATORY DATA:  Sodium 134, potassium 3.8, chloride 102, bicarb 24,  glucose 151, BUN 14, creatinine 1.2. WBC 20.3, hemoglobin 13.5, hematocrit  39.4, MCV 87.8, platelet count 251.  Urinalysis is positive for UTI with UA  negative except for leukocyte esterase with wbc's 7-10 and some bacteria.   IMPRESSION:  1. Urinary tract infection.  This patient is to be admitted to the      hospital because of high white count in a patient who has fevers with      some change in mental status that may be attributed to her urinary      tract infection.  I will start her on Cipro 400 mg IV q. 12 h.  I will      also send for urine culture and blood culture.  I will give Pamelor      p.r.n. for fever.  I will repeat her CBC to follow her leukocytes.  2. History of seizures.  There are no recent seizures.  The patient is      getting gabapentin, and I will continue that.  3. Depression.  I will continue her Lexapro and amitriptyline.  4. History of transient ischemic attack.  This is stable at the present.   DISCHARGE TIME:  50 minutes.      Michelene Gardener, MD  Electronically Signed     NAE/MEDQ  D:  04/08/2006  T:  04/08/2006  Job:  [id number]

## 2010-12-15 NOTE — Assessment & Plan Note (Signed)
[facility]                         GASTROENTEROLOGY OFFICE NOTE   KEYOSHA, TIEDT                        MRN:          [id number]  DATE:10/09/2006                            DOB:          Sep 30, 1933    CHIEF COMPLAINT:  Follow-up of C. difficile colitis, getting ready for  knee replacement.   [name] has had about 17 days without diarrhea.  Her stools are still  soft and she has some mild left lower quadrant pain at times.  S he had  to back on vancomycin with a taper in late December, prescribed by one  of my partners.  I had seen her in November and given her a 2-week  course of vancomycin plus Florastor, but she had recurrent symptoms.  She is getting ready to have a knee replacement with Dr. Simonne Come, and  Dr. Simonne Come has written me a letter asking about guidance as far as  when she could have surgery again.  It may just be an arthroscopy and  not necessarily a knee replacement, I should say.   MEDICATIONS:  Listed and reviewed in the chart.   PHYSICAL EXAMINATION:  GENERAL:  A pleasant, obese, elderly white woman  in no acute distress.  VITAL SIGNS:  Pulse 64.  Weight 280 pounds.  Blood pressure 122/80.  ABDOMEN:  She is mildly tender in the abdomen in the left lower  quadrant.  Bowel sounds are present.  MUSCULOSKELETAL:  She clearly has decreased mobility due to right knee  pain.   ASSESSMENT:  Pseudomembranous colitis, clinically resolved.  I think she  may have some post-colitic irritable bowel syndrome-type problems that,  hopefully, will dissipate.  They may persist.   RECOMMENDATIONS AND PLAN:  I see no problem with going ahead with  surgery.  I suspect she will need at least some Tranxene or prophylactic  on-call antibiotics.  I have advised her to use Florastor, a probiotic,  250 mg two twice a day from now until her surgery and several days  afterwards, at least.  If it is too costly, she could start it about a  week  before her surgery.  The idea is to help reduce the risk of  recurrent C. difficile.  Should she develop diarrhea again, she should  contact us immediately, or Dr. Simonne Come should, to advise regarding  recurrent treatment, which would require vancomycin and another taper, I  think.     Iva Boop, MD,FACG  Electronically Signed    CEG/MedQ  DD: 10/09/2006  DT: 10/11/2006  Job #: [id number]   cc:   Marlowe Kays, M.D.  Magnus Sinning. Rice, M.D.

## 2011-01-18 ENCOUNTER — Ambulatory Visit: Payer: Medicare Other | Admitting: Physical Therapy

## 2011-02-09 ENCOUNTER — Ambulatory Visit: Payer: Medicare Other | Attending: Specialist | Admitting: *Deleted

## 2011-02-09 DIAGNOSIS — M25669 Stiffness of unspecified knee, not elsewhere classified: Secondary | ICD-10-CM | POA: Insufficient documentation

## 2011-02-09 DIAGNOSIS — M25569 Pain in unspecified knee: Secondary | ICD-10-CM | POA: Insufficient documentation

## 2011-02-09 DIAGNOSIS — R5381 Other malaise: Secondary | ICD-10-CM | POA: Insufficient documentation

## 2011-02-09 DIAGNOSIS — IMO0001 Reserved for inherently not codable concepts without codable children: Secondary | ICD-10-CM | POA: Insufficient documentation

## 2011-02-13 ENCOUNTER — Ambulatory Visit: Payer: Medicare Other | Admitting: Physical Therapy

## 2011-02-15 ENCOUNTER — Ambulatory Visit: Payer: Medicare Other | Admitting: Physical Therapy

## 2011-02-20 ENCOUNTER — Ambulatory Visit: Payer: Medicare Other | Admitting: Physical Therapy

## 2011-02-22 ENCOUNTER — Ambulatory Visit: Payer: Medicare Other | Admitting: Physical Therapy

## 2011-02-28 ENCOUNTER — Ambulatory Visit: Payer: Medicare Other | Attending: Specialist | Admitting: Rehabilitative and Restorative Service Providers"

## 2011-02-28 DIAGNOSIS — IMO0001 Reserved for inherently not codable concepts without codable children: Secondary | ICD-10-CM | POA: Insufficient documentation

## 2011-02-28 DIAGNOSIS — M25669 Stiffness of unspecified knee, not elsewhere classified: Secondary | ICD-10-CM | POA: Insufficient documentation

## 2011-02-28 DIAGNOSIS — M25569 Pain in unspecified knee: Secondary | ICD-10-CM | POA: Insufficient documentation

## 2011-02-28 DIAGNOSIS — R5381 Other malaise: Secondary | ICD-10-CM | POA: Insufficient documentation

## 2011-03-02 ENCOUNTER — Ambulatory Visit: Payer: Medicare Other | Admitting: Rehabilitative and Restorative Service Providers"

## 2011-03-07 ENCOUNTER — Ambulatory Visit: Payer: Medicare Other | Admitting: Rehabilitative and Restorative Service Providers"

## 2011-03-09 ENCOUNTER — Ambulatory Visit: Payer: Medicare Other | Admitting: Rehabilitative and Restorative Service Providers"

## 2011-03-14 ENCOUNTER — Ambulatory Visit: Payer: Medicare Other | Admitting: Physical Therapy

## 2011-03-16 ENCOUNTER — Ambulatory Visit: Payer: Medicare Other | Admitting: Physical Therapy

## 2011-03-20 ENCOUNTER — Ambulatory Visit: Payer: Medicare Other | Admitting: Physical Therapy

## 2011-03-23 ENCOUNTER — Ambulatory Visit: Payer: Medicare Other | Admitting: Rehabilitative and Restorative Service Providers"

## 2011-03-27 ENCOUNTER — Ambulatory Visit: Payer: Medicare Other | Admitting: Physical Therapy

## 2011-03-30 ENCOUNTER — Ambulatory Visit: Payer: Medicare Other | Admitting: Rehabilitative and Restorative Service Providers"

## 2011-04-03 ENCOUNTER — Encounter: Payer: Medicare Other | Admitting: Rehabilitative and Restorative Service Providers"

## 2011-04-06 ENCOUNTER — Encounter: Payer: Medicare Other | Admitting: Rehabilitative and Restorative Service Providers"

## 2011-04-23 LAB — POCT HEMOGLOBIN-HEMACUE: Hemoglobin: 14.3

## 2011-08-09 DIAGNOSIS — R55 Syncope and collapse: Secondary | ICD-10-CM | POA: Diagnosis not present

## 2011-08-09 DIAGNOSIS — E038 Other specified hypothyroidism: Secondary | ICD-10-CM | POA: Diagnosis not present

## 2011-08-09 DIAGNOSIS — I498 Other specified cardiac arrhythmias: Secondary | ICD-10-CM | POA: Diagnosis not present

## 2011-10-22 ENCOUNTER — Telehealth: Payer: Self-pay | Admitting: Internal Medicine

## 2011-10-22 NOTE — Telephone Encounter (Signed)
Patient states she had some blood in her stool this am.  She would like to be seen.  I have offered her an appt with Willette Cluster RNP for tomorrow, she declines and wants to wait to see Dr Leone Payor on 11/16/11.  She will call back for worsening symptoms.

## 2011-10-30 DIAGNOSIS — E039 Hypothyroidism, unspecified: Secondary | ICD-10-CM | POA: Diagnosis not present

## 2011-10-30 DIAGNOSIS — J209 Acute bronchitis, unspecified: Secondary | ICD-10-CM | POA: Diagnosis not present

## 2011-10-30 DIAGNOSIS — E559 Vitamin D deficiency, unspecified: Secondary | ICD-10-CM | POA: Diagnosis not present

## 2011-10-30 DIAGNOSIS — I10 Essential (primary) hypertension: Secondary | ICD-10-CM | POA: Diagnosis not present

## 2011-10-30 DIAGNOSIS — E785 Hyperlipidemia, unspecified: Secondary | ICD-10-CM | POA: Diagnosis not present

## 2011-11-13 DIAGNOSIS — I359 Nonrheumatic aortic valve disorder, unspecified: Secondary | ICD-10-CM | POA: Diagnosis not present

## 2011-11-13 DIAGNOSIS — I10 Essential (primary) hypertension: Secondary | ICD-10-CM | POA: Diagnosis not present

## 2011-11-13 DIAGNOSIS — E038 Other specified hypothyroidism: Secondary | ICD-10-CM | POA: Diagnosis not present

## 2011-11-16 ENCOUNTER — Ambulatory Visit: Payer: Medicare Other | Admitting: Internal Medicine

## 2011-11-27 DIAGNOSIS — I359 Nonrheumatic aortic valve disorder, unspecified: Secondary | ICD-10-CM | POA: Diagnosis not present

## 2012-03-25 ENCOUNTER — Encounter: Payer: Self-pay | Admitting: Internal Medicine

## 2012-03-26 DIAGNOSIS — K921 Melena: Secondary | ICD-10-CM | POA: Diagnosis not present

## 2012-03-26 DIAGNOSIS — N390 Urinary tract infection, site not specified: Secondary | ICD-10-CM | POA: Diagnosis not present

## 2012-04-01 DIAGNOSIS — Z1212 Encounter for screening for malignant neoplasm of rectum: Secondary | ICD-10-CM | POA: Diagnosis not present

## 2012-04-25 ENCOUNTER — Encounter: Payer: Self-pay | Admitting: Internal Medicine

## 2012-04-25 ENCOUNTER — Ambulatory Visit (INDEPENDENT_AMBULATORY_CARE_PROVIDER_SITE_OTHER): Payer: Medicare Other | Admitting: Internal Medicine

## 2012-04-25 VITALS — BP 120/80 | HR 80 | Ht 62.5 in | Wt 231.2 lb

## 2012-04-25 DIAGNOSIS — R1319 Other dysphagia: Secondary | ICD-10-CM | POA: Diagnosis not present

## 2012-04-25 DIAGNOSIS — K921 Melena: Secondary | ICD-10-CM | POA: Diagnosis not present

## 2012-04-25 DIAGNOSIS — G319 Degenerative disease of nervous system, unspecified: Secondary | ICD-10-CM | POA: Insufficient documentation

## 2012-04-25 NOTE — Patient Instructions (Addendum)
You have been scheduled for an endoscopy with propofol. Please follow written instructions given to you at your visit today. If you use inhalers (even only as needed), please bring them with you on the day of your procedure.  We are going to get your recent records from [facility] with the ROI you signed today.  You have been scheduled for a Barium Swallow with tablet at [facility] (1st floor of the hospital) on 05-01-2012 at 10:30am Please arrive 15 minutes prior to your appointment for registration. If you need to reschedule for any reason, please contact radiology at [phone] to do so.   Thank you for choosing me and  Gastroenterology.   Iva Boop, M.D., [facility]

## 2012-04-25 NOTE — Progress Notes (Signed)
  Subjective:    Patient ID: [name], female    DOB: 12-Jul-1934, 78 y.o.   MRN: [id number]  HPI This is a very pleasant elderly white woman here with her husband today. She is known to me in the past of IBS, questionable GI bleeding and reflux problems. She comes in with 2 main issues.  The first is dysphagia. She has regurgitation also. Food is moving out slowly and she has to drink water or liquids make the food move down, and also for pills at times. She very frequently has regurgitation when she drinks after a food bolus and sometimes will almost aspirating or cough when this happens. She is on a PPI but still has some heartburn and has intermittent mild upper abdominal pain. She has gained weight.  She's had 2 episodes, 6 months in one month ago of a one-day history of fairly frequent loose dark stools that might have a little bit of red or wine-colored to them. She thought she might have been bleeding. With the last episode she was evaluated her primary care office and tells me that she was heme-negative and had a normal hemoglobin. I will request those records.  GI review of systems also positive for bloating and gas in her IBS-like symptoms of alternating bowel habits at times.  Medications, allergies, past medical history, past surgical history, family history and social history are reviewed and updated in the EMR.  Review of Systems This is positive for cerebellar degeneration problems and loss of balance and must use a walker to cannulate. It is otherwise somewhat diffusely positive with anxiety, back and joint pain, eyeglasses, fatigue, dyspnea, urinary leakage at times, palpitations. All other review of systems are negative or as per history of present illness.    Objective:   Physical Exam General:  Well-developed, well-nourished and in no acute distress- obese Eyes:  anicteric. ENT:   Mouth and posterior pharynx free of lesions.  Neck:   supple w/o thyromegaly or mass.    Lungs: Clear to auscultation bilaterally. Heart:  S1S2, no rubs, murmurs, gallops. Abdomen:  Obese soft, non-tender, no hepatosplenomegaly, hernia, or mass and BS+.  Lymph:  no cervical or supraclavicular adenopathy. Extremities:   no edema Skin   no rash. Neuro:  A&O x 3.  Psych:  appropriate mood and  Affect.   Data Reviewed: 2009 EGD and colonoscopy I have requested copies of recent office notes and labs from [facility]       Assessment & Plan:   1. Dysphagia   2. ? Melena    1. Will request records of labs and office visits from her primary care 2. Will schedule upper GI endoscopy with possible esophageal dilation, risks benefits and indications are explained she understands and agrees to proceed. 3. We'll have her do a barium swallow tablet also. I think this will provide extra useful information given her overall symptom complex of the dysphagia and regurgitation symptoms.  CC: Rudi Heap, MD

## 2012-04-29 DIAGNOSIS — Z23 Encounter for immunization: Secondary | ICD-10-CM | POA: Diagnosis not present

## 2012-05-01 ENCOUNTER — Ambulatory Visit (HOSPITAL_COMMUNITY)
Admission: RE | Admit: 2012-05-01 | Discharge: 2012-05-01 | Disposition: A | Discharge status: Home / Self Care | Payer: Medicare Other | Source: Ambulatory Visit | Attending: Internal Medicine | Admitting: Internal Medicine

## 2012-05-01 DIAGNOSIS — K2289 Other specified disease of esophagus: Secondary | ICD-10-CM | POA: Insufficient documentation

## 2012-05-01 DIAGNOSIS — K228 Other specified diseases of esophagus: Secondary | ICD-10-CM | POA: Insufficient documentation

## 2012-05-01 DIAGNOSIS — R1319 Other dysphagia: Secondary | ICD-10-CM

## 2012-05-01 DIAGNOSIS — R1310 Dysphagia, unspecified: Secondary | ICD-10-CM | POA: Insufficient documentation

## 2012-05-01 DIAGNOSIS — K219 Gastro-esophageal reflux disease without esophagitis: Secondary | ICD-10-CM | POA: Insufficient documentation

## 2012-05-07 ENCOUNTER — Encounter: Payer: Self-pay | Admitting: Internal Medicine

## 2012-05-07 ENCOUNTER — Ambulatory Visit (AMBULATORY_SURGERY_CENTER): Payer: Medicare Other | Admitting: Internal Medicine

## 2012-05-07 VITALS — BP 135/71 | HR 63 | Temp 98.5°F | Resp 17 | Ht 62.5 in | Wt 231.0 lb

## 2012-05-07 DIAGNOSIS — R1319 Other dysphagia: Secondary | ICD-10-CM | POA: Diagnosis not present

## 2012-05-07 DIAGNOSIS — D131 Benign neoplasm of stomach: Secondary | ICD-10-CM | POA: Diagnosis not present

## 2012-05-07 DIAGNOSIS — K317 Polyp of stomach and duodenum: Secondary | ICD-10-CM

## 2012-05-07 DIAGNOSIS — R1310 Dysphagia, unspecified: Secondary | ICD-10-CM | POA: Diagnosis not present

## 2012-05-07 MED ORDER — SODIUM CHLORIDE 0.9 % IV SOLN: 500.0000 mL | INTRAVENOUS | Status: DC

## 2012-05-07 NOTE — Progress Notes (Signed)
Patient did not experience any of the following events: a burn prior to discharge; a fall within the facility; wrong site/side/patient/procedure/implant event; or a hospital transfer or hospital admission upon discharge from the facility. (G8907) Patient did not have preoperative order for IV antibiotic SSI prophylaxis. (G8918)  

## 2012-05-07 NOTE — Patient Instructions (Addendum)
There were some stomach polyps seen and I took biopsies. I do not think they are a problem.  I dilated or stretched the esophagus to see if that helps you.  Let's see how it goes and if you continue to have problems let me know. I will send a letter about the polyp results.  Thank you for choosing me and [facility].  Iva Boop, MD, [facility]  Discharge instructions given with verbal understanding. Dilatation diet given. Resume previous medications. YOU HAD AN ENDOSCOPIC PROCEDURE TODAY AT THE [facility]: Refer to the procedure report that was given to you for any specific questions about what was found during the examination.  If the procedure report does not answer your questions, please call your gastroenterologist to clarify.  If you requested that your care partner not be given the details of your procedure findings, then the procedure report has been included in a sealed envelope for you to review at your convenience later.  YOU SHOULD EXPECT: Some feelings of bloating in the abdomen. Passage of more gas than usual.  Walking can help get rid of the air that was put into your GI tract during the procedure and reduce the bloating. If you had a lower endoscopy (such as a colonoscopy or flexible sigmoidoscopy) you may notice spotting of blood in your stool or on the toilet paper. If you underwent a bowel prep for your procedure, then you may not have a normal bowel movement for a few days.  DIET: Your first meal following the procedure should be a light meal and then it is ok to progress to your normal diet.  A half-sandwich or bowl of soup is an example of a good first meal.  Heavy or fried foods are harder to digest and may make you feel nauseous or bloated.  Likewise meals heavy in dairy and vegetables can cause extra gas to form and this can also increase the bloating.  Drink plenty of fluids but you should avoid alcoholic beverages for 24 hours.  ACTIVITY: Your  care partner should take you home directly after the procedure.  You should plan to take it easy, moving slowly for the rest of the day.  You can resume normal activity the day after the procedure however you should NOT DRIVE or use heavy machinery for 24 hours (because of the sedation medicines used during the test).    SYMPTOMS TO REPORT IMMEDIATELY: A gastroenterologist can be reached at any hour.  During normal business hours, 8:30 AM to 5:00 PM Monday through Friday, call [phone].  After hours and on weekends, please call the GI answering service at [phone] who will take a message and have the physician on call contact you.   Following upper endoscopy (EGD)  Vomiting of blood or coffee ground material  New chest pain or pain under the shoulder blades  Painful or persistently difficult swallowing  New shortness of breath  Fever of 100F or higher  Black, tarry-looking stools  FOLLOW UP: If any biopsies were taken you will be contacted by phone or by letter within the next 1-3 weeks.  Call your gastroenterologist if you have not heard about the biopsies in 3 weeks.  Our staff will call the home number listed on your records the next business day following your procedure to check on you and address any questions or concerns that you may have at that time regarding the information given to you following your procedure. This is a Research officer, political party  call and so if there is no answer at the home number and we have not heard from you through the emergency physician on call, we will assume that you have returned to your regular daily activities without incident.  SIGNATURES/CONFIDENTIALITY: You and/or your care partner have signed paperwork which will be entered into your electronic medical record.  These signatures attest to the fact that that the information above on your After Visit Summary has been reviewed and is understood.  Full responsibility of the confidentiality of this discharge  information lies with you and/or your care-partner.

## 2012-05-07 NOTE — Op Note (Signed)
 [facility] [address] [place], [address]   ENDOSCOPY PROCEDURE REPORT  PATIENT: Rebekah, Paul  MR#: [id number] BIRTHDATE: 1933/09/07 , 78  yrs. old GENDER: Female ENDOSCOPIST: Iva Boop, MD, [facility] PROCEDURE DATE:  05/07/2012 PROCEDURE:   EGD with biopsy ASA CLASS:   Class III INDICATIONS:dysphagia. MEDICATIONS: propofol (Diprivan) 100mg  IV, MAC sedation, administered by CRNA, and These medications were titrated to patient response per physician's verbal order TOPICAL ANESTHETIC:   Cetacaine Spray  DESCRIPTION OF PROCEDURE:   After the risks benefits and alternatives of the procedure were thoroughly explained, informed consent was obtained.  The     endoscope was introduced through the mouth  and advanced to the second portion of the duodenum , no limitations.        The instrument was slowly withdrawn as the mucosa was carefully examined.      ESOPHAGUS: The esophagus was tortuous.   Z-line at 40 cm.  STOMACH: Multiple smooth sessile polyps ranging between 3-49mm in size were found in the gastric fundus and gastric body.  Multiple biopsies was performed using cold forceps.  Sample sent for histology.   The stomach otherwise appeared normal.  DUODENUM: The duodenal mucosa showed no abnormalities in the 2nd part of the duodenum.     Dilation was then performed at the distal esophagus  Dilator:Maloney Size:54 [place]  Reststance:minimal Heme:none  COMPLICATIONS: There were no complications. ENDOSCOPIC IMPRESSION: 1.   The esophagus was tortuous - dilated 54 French 2.   Multiple sessile polyps ranging between 3-16mm in size were found in the gastric fundus and gastric body - look like fundic gland polyps 3.   The stomach otherwise appeared normal 4.   The duodenal mucosa showed no abnormalities in the 2nd part of the duodenum  RECOMMENDATIONS: Clear liquids until 4 PM  , then soft foods rest of day.  Resume prior diet tomorrow.   eSigned:   Iva Boop, MD, [facility] 05/07/2012 2:56 PM  EX:BMWUXL Christell Constant, MD The Patient

## 2012-05-08 ENCOUNTER — Telehealth: Payer: Self-pay | Admitting: *Deleted

## 2012-05-08 NOTE — Telephone Encounter (Signed)
  Follow up Call-  Call back number 05/07/2012  Post procedure Call Back phone  # [phone]  Permission to leave phone message Yes     Spoke with spouse and he states she is still sleeping but she had a great day yesterday and seems to be all right at the moment.  Instructed him to let her know we called and to have her call us if she is experiencing any problems or has questions

## 2012-05-12 ENCOUNTER — Encounter: Payer: Self-pay | Admitting: Internal Medicine

## 2012-05-12 NOTE — Progress Notes (Signed)
Quick Note:  Fundic gland polyp No recall needed ______

## 2012-05-29 DIAGNOSIS — K219 Gastro-esophageal reflux disease without esophagitis: Secondary | ICD-10-CM | POA: Diagnosis not present

## 2012-05-29 DIAGNOSIS — R55 Syncope and collapse: Secondary | ICD-10-CM | POA: Diagnosis not present

## 2012-05-29 DIAGNOSIS — R5383 Other fatigue: Secondary | ICD-10-CM | POA: Diagnosis not present

## 2012-05-29 DIAGNOSIS — R6889 Other general symptoms and signs: Secondary | ICD-10-CM | POA: Diagnosis not present

## 2012-06-02 DIAGNOSIS — M1710 Unilateral primary osteoarthritis, unspecified knee: Secondary | ICD-10-CM | POA: Diagnosis not present

## 2012-06-02 DIAGNOSIS — M545 Low back pain: Secondary | ICD-10-CM | POA: Diagnosis not present

## 2012-06-02 DIAGNOSIS — M2390 Unspecified internal derangement of unspecified knee: Secondary | ICD-10-CM | POA: Diagnosis not present

## 2012-06-10 DIAGNOSIS — M1710 Unilateral primary osteoarthritis, unspecified knee: Secondary | ICD-10-CM | POA: Diagnosis not present

## 2012-06-17 DIAGNOSIS — S83289A Other tear of lateral meniscus, current injury, unspecified knee, initial encounter: Secondary | ICD-10-CM | POA: Diagnosis not present

## 2012-06-19 ENCOUNTER — Other Ambulatory Visit: Payer: Self-pay | Admitting: Orthopedic Surgery

## 2012-06-19 NOTE — Progress Notes (Signed)
06-19-12 1500 Need Md order entry in Epic.W.Sunni Richardson,RN

## 2012-06-24 ENCOUNTER — Encounter (HOSPITAL_COMMUNITY): Payer: Self-pay | Admitting: Pharmacy Technician

## 2012-06-25 NOTE — Progress Notes (Signed)
Last ECHO 11/27/11 Dr. Alanda Amass on chart, Last office visit for cardiology Dr. Alanda Amass and EKG 05/29/12 on chart.

## 2012-06-25 NOTE — Patient Instructions (Addendum)
20 Kajol Crispen  06/25/2012   Your procedure is scheduled on: 07/03/12  Report to [facility] at 1230 PM.  Call this number if you have problems the morning of surgery [phone]: [phone]   Remember:   Do not eat food  After Midnight 07/02/12.  Clear liquids midnight until 9am 07/03/12 then nothing.    Take these medicines the morning of surgery with A SIP OF WATER: synthroid, gabapentin, prilosec   Do not wear jewelry, make-up or nail polish.  Do not wear lotions, powders, or perfumes. You may wear deodorant.  Do not shave 48 hours prior to surgery. Men may shave face and neck.  Do not bring valuables to the hospital.  Contacts, dentures or bridgework may not be worn into surgery.  Leave suitcase in the car. After surgery it may be brought to your room.  For patients admitted to the hospital, checkout time is 11:00 AM the day of discharge.     Special Instructions: Shower using CHG 2 nights before surgery and the night before surgery.  If you shower the day of surgery use CHG.  Use special wash - you have one bottle of CHG for all showers.  You should use approximately 1/3 of the bottle for each shower.   Please read over the following fact sheets that you were given: MRSA Information, clear liquid fact sheet.   Birdie Sons, RN  pre op nurse call if needed [phone]

## 2012-06-30 ENCOUNTER — Encounter (HOSPITAL_COMMUNITY): Payer: Self-pay

## 2012-06-30 ENCOUNTER — Encounter (HOSPITAL_COMMUNITY)
Admission: RE | Admit: 2012-06-30 | Discharge: 2012-06-30 | Disposition: A | Discharge status: Home / Self Care | Payer: Medicare Other | Source: Ambulatory Visit | Attending: Orthopedic Surgery | Admitting: Orthopedic Surgery

## 2012-06-30 ENCOUNTER — Ambulatory Visit (HOSPITAL_COMMUNITY)
Admission: RE | Admit: 2012-06-30 | Discharge: 2012-06-30 | Disposition: A | Discharge status: Home / Self Care | Payer: Medicare Other | Source: Ambulatory Visit | Attending: Orthopedic Surgery | Admitting: Orthopedic Surgery

## 2012-06-30 DIAGNOSIS — Z01818 Encounter for other preprocedural examination: Secondary | ICD-10-CM | POA: Insufficient documentation

## 2012-06-30 DIAGNOSIS — I7090 Unspecified atherosclerosis: Secondary | ICD-10-CM | POA: Diagnosis not present

## 2012-06-30 DIAGNOSIS — I517 Cardiomegaly: Secondary | ICD-10-CM | POA: Insufficient documentation

## 2012-06-30 DIAGNOSIS — I10 Essential (primary) hypertension: Secondary | ICD-10-CM | POA: Insufficient documentation

## 2012-06-30 HISTORY — DX: Glossodynia: K14.6

## 2012-06-30 HISTORY — DX: Adverse effect of unspecified anesthetic, initial encounter: T41.45XA

## 2012-06-30 HISTORY — DX: Hypothyroidism, unspecified: E03.9

## 2012-06-30 HISTORY — DX: Personal history of (corrected) congenital malformations of heart and circulatory system: Z87.74

## 2012-06-30 HISTORY — DX: Other complications of anesthesia, initial encounter: T88.59XA

## 2012-06-30 HISTORY — DX: Polyp of stomach and duodenum: K31.7

## 2012-06-30 LAB — CBC
Hemoglobin: 13.9 g/dL (ref 12.0–15.0)
MCHC: 32.9 g/dL (ref 30.0–36.0)
Platelets: 252 10*3/uL (ref 150–400)
RBC: 4.65 MIL/uL (ref 3.87–5.11)

## 2012-06-30 LAB — BASIC METABOLIC PANEL
Calcium: 10.4 mg/dL (ref 8.4–10.5)
GFR calc non Af Amer: 68 mL/min — ABNORMAL LOW (ref >90)
Glucose, Bld: 106 mg/dL — ABNORMAL HIGH (ref 70–99)
Potassium: 4.4 mEq/L (ref 3.5–5.1)
Sodium: 138 mEq/L (ref 135–145)

## 2012-06-30 LAB — SURGICAL PCR SCREEN
MRSA, PCR: NEGATIVE
Staphylococcus aureus: NEGATIVE

## 2012-06-30 NOTE — Progress Notes (Signed)
06/30/12 1247  OBSTRUCTIVE SLEEP APNEA  Have you ever been diagnosed with sleep apnea through a sleep study? No  Do you snore loudly (loud enough to be heard through closed doors)?  0  Do you often feel tired, fatigued, or sleepy during the daytime? 1  Has anyone observed you stop breathing during your sleep? 0  Do you have, or are you being treated for high blood pressure? 1  BMI more than 35 kg/m2? 1  Age over 76 years old? 1  Neck circumference greater than 40 cm/18 inches? 0  Gender: 0  Obstructive Sleep Apnea Score 4   Score 4 or greater  Results sent to PCP

## 2012-07-03 ENCOUNTER — Observation Stay (HOSPITAL_COMMUNITY)
Admission: RE | Admit: 2012-07-03 | Discharge: 2012-07-04 | Disposition: A | Discharge status: Home / Self Care | Payer: Medicare Other | Source: Ambulatory Visit | Attending: Orthopedic Surgery | Admitting: Orthopedic Surgery

## 2012-07-03 ENCOUNTER — Encounter (HOSPITAL_COMMUNITY): Payer: Self-pay | Admitting: Anesthesiology

## 2012-07-03 ENCOUNTER — Encounter (HOSPITAL_COMMUNITY)
Admission: RE | Disposition: A | Discharge status: Home / Self Care | Payer: Self-pay | Source: Ambulatory Visit | Attending: Orthopedic Surgery

## 2012-07-03 ENCOUNTER — Encounter (HOSPITAL_COMMUNITY): Payer: Self-pay | Admitting: *Deleted

## 2012-07-03 ENCOUNTER — Ambulatory Visit (HOSPITAL_COMMUNITY): Payer: Medicare Other | Admitting: Anesthesiology

## 2012-07-03 DIAGNOSIS — E785 Hyperlipidemia, unspecified: Secondary | ICD-10-CM | POA: Diagnosis not present

## 2012-07-03 DIAGNOSIS — S83289A Other tear of lateral meniscus, current injury, unspecified knee, initial encounter: Principal | ICD-10-CM | POA: Insufficient documentation

## 2012-07-03 DIAGNOSIS — Z79899 Other long term (current) drug therapy: Secondary | ICD-10-CM | POA: Insufficient documentation

## 2012-07-03 DIAGNOSIS — E039 Hypothyroidism, unspecified: Secondary | ICD-10-CM | POA: Diagnosis not present

## 2012-07-03 DIAGNOSIS — I10 Essential (primary) hypertension: Secondary | ICD-10-CM | POA: Insufficient documentation

## 2012-07-03 DIAGNOSIS — IMO0002 Reserved for concepts with insufficient information to code with codable children: Secondary | ICD-10-CM | POA: Diagnosis not present

## 2012-07-03 DIAGNOSIS — M1710 Unilateral primary osteoarthritis, unspecified knee: Secondary | ICD-10-CM | POA: Diagnosis not present

## 2012-07-03 DIAGNOSIS — M23302 Other meniscus derangements, unspecified lateral meniscus, unspecified knee: Secondary | ICD-10-CM | POA: Diagnosis not present

## 2012-07-03 DIAGNOSIS — Z8673 Personal history of transient ischemic attack (TIA), and cerebral infarction without residual deficits: Secondary | ICD-10-CM | POA: Insufficient documentation

## 2012-07-03 DIAGNOSIS — Z9889 Other specified postprocedural states: Secondary | ICD-10-CM

## 2012-07-03 DIAGNOSIS — K219 Gastro-esophageal reflux disease without esophagitis: Secondary | ICD-10-CM | POA: Insufficient documentation

## 2012-07-03 DIAGNOSIS — X58XXXA Exposure to other specified factors, initial encounter: Secondary | ICD-10-CM | POA: Insufficient documentation

## 2012-07-03 HISTORY — PX: KNEE ARTHROSCOPY WITH LATERAL MENISECTOMY: SHX6193

## 2012-07-03 SURGERY — ARTHROSCOPY, KNEE, WITH LATERAL MENISCECTOMY
Anesthesia: General | Site: Knee | Laterality: Left | Wound class: Clean

## 2012-07-03 MED ORDER — OXYCODONE-ACETAMINOPHEN 5-325 MG PO TABS
1.0000 | ORAL_TABLET | ORAL | Status: DC | PRN
  Administered 2012-07-03: 2 via ORAL
  Administered 2012-07-03 – 2012-07-04 (×3): 1 via ORAL
  Filled 2012-07-03: qty 1
  Filled 2012-07-03: qty 2
  Filled 2012-07-03: qty 1

## 2012-07-03 MED ORDER — FENTANYL CITRATE 0.05 MG/ML IJ SOLN
INTRAMUSCULAR | Status: DC | PRN
  Administered 2012-07-03: 50 ug via INTRAVENOUS
  Administered 2012-07-03: 25 ug via INTRAVENOUS
  Administered 2012-07-03: 50 ug via INTRAVENOUS
  Administered 2012-07-03: 25 ug via INTRAVENOUS

## 2012-07-03 MED ORDER — LIDOCAINE HCL (CARDIAC) 20 MG/ML IV SOLN
INTRAVENOUS | Status: DC | PRN
  Administered 2012-07-03: 50 mg via INTRAVENOUS

## 2012-07-03 MED ORDER — ONDANSETRON HCL 4 MG/2ML IJ SOLN: 4.0000 mg | Freq: Four times a day (QID) | INTRAMUSCULAR | Status: DC | PRN

## 2012-07-03 MED ORDER — LACTATED RINGERS IV SOLN
INTRAVENOUS | Status: DC
  Administered 2012-07-03: 1000 mL via INTRAVENOUS

## 2012-07-03 MED ORDER — LISINOPRIL 20 MG PO TABS
20.0000 mg | ORAL_TABLET | Freq: Every day | ORAL | Status: DC
  Administered 2012-07-03: 20 mg via ORAL
  Filled 2012-07-03 (×2): qty 1

## 2012-07-03 MED ORDER — PROPOFOL 10 MG/ML IV BOLUS
INTRAVENOUS | Status: DC | PRN
  Administered 2012-07-03: 200 mg via INTRAVENOUS

## 2012-07-03 MED ORDER — EPINEPHRINE HCL 1 MG/ML IJ SOLN
INTRAMUSCULAR | Status: DC | PRN
  Administered 2012-07-03: 2 mg

## 2012-07-03 MED ORDER — DEXTROSE-NACL 5-0.2 % IV SOLN
INTRAVENOUS | Status: DC
  Administered 2012-07-03 – 2012-07-04 (×2): via INTRAVENOUS

## 2012-07-03 MED ORDER — NAPROXEN 250 MG PO TABS
250.0000 mg | ORAL_TABLET | Freq: Two times a day (BID) | ORAL | Status: DC | PRN
  Filled 2012-07-03: qty 1

## 2012-07-03 MED ORDER — PROMETHAZINE HCL 25 MG/ML IJ SOLN: 6.2500 mg | INTRAMUSCULAR | Status: DC | PRN

## 2012-07-03 MED ORDER — METOCLOPRAMIDE HCL 10 MG PO TABS: 5.0000 mg | ORAL_TABLET | Freq: Three times a day (TID) | ORAL | Status: DC | PRN

## 2012-07-03 MED ORDER — ONDANSETRON HCL 4 MG PO TABS: 4.0000 mg | ORAL_TABLET | Freq: Four times a day (QID) | ORAL | Status: DC | PRN

## 2012-07-03 MED ORDER — LEVOTHYROXINE SODIUM 50 MCG PO TABS
50.0000 ug | ORAL_TABLET | Freq: Every day | ORAL | Status: DC
  Administered 2012-07-04: 50 ug via ORAL
  Filled 2012-07-03 (×3): qty 1

## 2012-07-03 MED ORDER — SIMVASTATIN 20 MG PO TABS
20.0000 mg | ORAL_TABLET | Freq: Every day | ORAL | Status: DC
  Filled 2012-07-03: qty 1

## 2012-07-03 MED ORDER — LACTATED RINGERS IR SOLN
Status: DC | PRN
  Administered 2012-07-03: 6000 mL
  Administered 2012-07-03: 3000 mL

## 2012-07-03 MED ORDER — MORPHINE SULFATE 4 MG/ML IJ SOLN
INTRAMUSCULAR | Status: DC | PRN
  Administered 2012-07-03: 4 mg

## 2012-07-03 MED ORDER — GABAPENTIN 300 MG PO CAPS
300.0000 mg | ORAL_CAPSULE | Freq: Three times a day (TID) | ORAL | Status: DC
  Administered 2012-07-03 – 2012-07-04 (×2): 300 mg via ORAL
  Filled 2012-07-03 (×4): qty 1

## 2012-07-03 MED ORDER — METHOCARBAMOL 500 MG PO TABS: 500.0000 mg | ORAL_TABLET | Freq: Four times a day (QID) | ORAL | Status: DC | PRN

## 2012-07-03 MED ORDER — PANTOPRAZOLE SODIUM 40 MG PO TBEC
80.0000 mg | DELAYED_RELEASE_TABLET | Freq: Every day | ORAL | Status: DC
  Administered 2012-07-04: 80 mg via ORAL
  Filled 2012-07-03: qty 2

## 2012-07-03 MED ORDER — METHOCARBAMOL 100 MG/ML IJ SOLN
500.0000 mg | Freq: Four times a day (QID) | INTRAVENOUS | Status: DC | PRN
  Administered 2012-07-03: 500 mg via INTRAVENOUS
  Filled 2012-07-03: qty 5

## 2012-07-03 MED ORDER — METOCLOPRAMIDE HCL 5 MG/ML IJ SOLN: 5.0000 mg | Freq: Three times a day (TID) | INTRAMUSCULAR | Status: DC | PRN

## 2012-07-03 MED ORDER — FENTANYL CITRATE 0.05 MG/ML IJ SOLN
25.0000 ug | INTRAMUSCULAR | Status: DC | PRN
  Administered 2012-07-03 (×3): 50 ug via INTRAVENOUS

## 2012-07-03 MED ORDER — NAPROXEN SODIUM 220 MG PO TABS: 220.0000 mg | ORAL_TABLET | Freq: Two times a day (BID) | ORAL | Status: DC | PRN

## 2012-07-03 MED ORDER — KETOROLAC TROMETHAMINE 30 MG/ML IJ SOLN
15.0000 mg | Freq: Once | INTRAMUSCULAR | Status: AC | PRN
  Administered 2012-07-03: 30 mg via INTRAVENOUS

## 2012-07-03 MED ORDER — BUPIVACAINE-EPINEPHRINE 0.5% -1:200000 IJ SOLN
INTRAMUSCULAR | Status: DC | PRN
  Administered 2012-07-03: 20 mL

## 2012-07-03 MED ORDER — ESCITALOPRAM OXALATE 20 MG PO TABS
20.0000 mg | ORAL_TABLET | Freq: Every day | ORAL | Status: DC
  Administered 2012-07-03: 20 mg via ORAL
  Filled 2012-07-03 (×2): qty 1

## 2012-07-03 MED ORDER — POVIDONE-IODINE 7.5 % EX SOLN: Freq: Once | CUTANEOUS | Status: DC

## 2012-07-03 MED ORDER — AMITRIPTYLINE HCL 50 MG PO TABS
50.0000 mg | ORAL_TABLET | Freq: Every day | ORAL | Status: DC
  Administered 2012-07-03: 50 mg via ORAL
  Filled 2012-07-03 (×2): qty 1

## 2012-07-03 MED ORDER — LACTATED RINGERS IV SOLN
INTRAVENOUS | Status: DC | PRN
  Administered 2012-07-03 (×2): via INTRAVENOUS

## 2012-07-03 SURGICAL SUPPLY — 32 items
BANDAGE ELASTIC 6 VELCRO ST LF (GAUZE/BANDAGES/DRESSINGS) ×2 IMPLANT
BANDAGE GAUZE ELAST BULKY 4 IN (GAUZE/BANDAGES/DRESSINGS) ×2 IMPLANT
BLADE 4.2CUDA (BLADE) IMPLANT
BLADE CUDA SHAVER 3.5 (BLADE) ×2 IMPLANT
CLOTH BEACON ORANGE TIMEOUT ST (SAFETY) ×2 IMPLANT
CUFF TOURN SGL QUICK 34 (TOURNIQUET CUFF) ×1
CUFF TRNQT CYL 34X4X40X1 (TOURNIQUET CUFF) ×1 IMPLANT
DRSG EMULSION OIL 3X3 NADH (GAUZE/BANDAGES/DRESSINGS) ×2 IMPLANT
DRSG PAD ABDOMINAL 8X10 ST (GAUZE/BANDAGES/DRESSINGS) ×2 IMPLANT
DURAPREP 26ML APPLICATOR (WOUND CARE) ×2 IMPLANT
ELECT REM PT RETURN 9FT ADLT (ELECTROSURGICAL) ×2
ELECTRODE REM PT RTRN 9FT ADLT (ELECTROSURGICAL) ×1 IMPLANT
GLOVE BIO SURGEON STRL SZ7.5 (GLOVE) IMPLANT
GLOVE BIO SURGEON STRL SZ8 (GLOVE) IMPLANT
GLOVE ECLIPSE 8.0 STRL XLNG CF (GLOVE) ×4 IMPLANT
GLOVE INDICATOR 8.0 STRL GRN (GLOVE) ×2 IMPLANT
MANIFOLD NEPTUNE II (INSTRUMENTS) ×4 IMPLANT
NDL SAFETY ECLIPSE 18X1.5 (NEEDLE) ×4 IMPLANT
NEEDLE HYPO 18GX1.5 SHARP (NEEDLE) ×4
PACK ARTHROSCOPY WL (CUSTOM PROCEDURE TRAY) ×2 IMPLANT
PAD CAST 4YDX4 CTTN HI CHSV (CAST SUPPLIES) ×1 IMPLANT
PADDING CAST COTTON 4X4 STRL (CAST SUPPLIES) ×1
POSITIONER SURGICAL ARM (MISCELLANEOUS) ×2 IMPLANT
SET ARTHROSCOPY TUBING (MISCELLANEOUS) ×1
SET ARTHROSCOPY TUBING LN (MISCELLANEOUS) ×1 IMPLANT
SPONGE GAUZE 4X4 12PLY (GAUZE/BANDAGES/DRESSINGS) ×2 IMPLANT
SUT ETHILON 4 0 PS 2 18 (SUTURE) ×2 IMPLANT
SYR 20CC LL (SYRINGE) ×2 IMPLANT
SYR 3ML LL SCALE MARK (SYRINGE) ×6 IMPLANT
WAND 90 DEG TURBOVAC W/CORD (SURGICAL WAND) IMPLANT
WATER STERILE IRR 500ML POUR (IV SOLUTION) ×2 IMPLANT
WRAP KNEE MAXI GEL POST OP (GAUZE/BANDAGES/DRESSINGS) ×2 IMPLANT

## 2012-07-03 NOTE — Transfer of Care (Signed)
Immediate Anesthesia Transfer of Care Note  Patient: [name]  Procedure(s) Performed: Procedure(s) (LRB) with comments: KNEE ARTHROSCOPY WITH LATERAL MENISECTOMY (Left) - with Partial Lateral Menisectomy and Medial Menisectomy. Shaving of medial and lateral femoral condyles. Shaving of patella. Removal of a loose body  Patient Location: PACU  Anesthesia Type:General  Level of Consciousness: awake and alert   Airway & Oxygen Therapy: Patient Spontanous Breathing and Patient connected to face mask oxygen  Post-op Assessment: Report given to PACU RN and Post -op Vital signs reviewed and stable  Post vital signs: Reviewed and stable  Complications: No apparent anesthesia complications

## 2012-07-03 NOTE — Brief Op Note (Signed)
07/03/2012  5:36 PM  PATIENT:  [name]  77 y.o. female  PRE-OPERATIVE DIAGNOSIS:  Torn Lateral Meniscus of the Left Knee  POST-OPERATIVE DIAGNOSIS:  Torn Lateral Meniscus of the Left Knee  PROCEDURE:  Procedure(s) (LRB) with comments: KNEE ARTHROSCOPY WITH LATERAL MENISECTOMY (Left) - with Partial Lateral Menisectomy and Medial Menisectomy. Shaving of medial and lateral femoral condyles. Shaving of patella. Removal of a loose body  SURGEON:  Surgeon(s) and Role:    * Drucilla Schmidt, MD - Primary  PHYSICIAN ASSISTANT:   ASSISTANTS:nurse  ANESTHESIA:   local and general  EBL:  Total I/O In: 1000 [I.V.:1000] Out: -   BLOOD ADMINISTERED:none  DRAINS: none   LOCAL MEDICATIONS USED:  MARCAINE     SPECIMEN:  No Specimen  DISPOSITION OF SPECIMEN:  N/A  COUNTS:  YES  TOURNIQUET:   Total Tourniquet Time Documented: Thigh (Left) - 61 minutes  DICTATION: .Other Dictation: Dictation Number [id number]  PLAN OF CARE: Admit for overnight observation  PATIENT DISPOSITION:  PACU - hemodynamically stable.   Delay start of Pharmacological VTE agent (>24hrs) due to surgical blood loss or risk of bleeding: yes

## 2012-07-03 NOTE — Anesthesia Postprocedure Evaluation (Signed)
  Anesthesia Post-op Note  Patient: [name]  Procedure(s) Performed: Procedure(s) (LRB): KNEE ARTHROSCOPY WITH LATERAL MENISECTOMY (Left)  Patient Location: PACU  Anesthesia Type: General  Level of Consciousness: awake and alert   Airway and Oxygen Therapy: Patient Spontanous Breathing  Post-op Pain: mild  Post-op Assessment: Post-op Vital signs reviewed, Patient's Cardiovascular Status Stable, Respiratory Function Stable, Patent Airway and No signs of Nausea or vomiting  Last Vitals:  Filed Vitals:   07/03/12 1630  BP: 149/49  Pulse: 84  Temp:   Resp: 15    Post-op Vital Signs: stable   Complications: No apparent anesthesia complications

## 2012-07-03 NOTE — H&P (Signed)
[name] is an 78 y.o. female.   Chief Complaint: painful lt knee HPI:MRI demonstrates torn lateral meniscus  Past Medical History  Diagnosis Date  . IBS (irritable bowel syndrome)   . Hypertension   . Hyperlipidemia   . GERD (gastroesophageal reflux disease)   . Depression   . Murmur, heart   . Chronic pericarditis   . CVA (cerebral infarction) 05/2003  . Seizure disorder   . TIA (transient ischemic attack)   . Anxiety   . Chronic urinary tract infection   . Cerebellar degeneration   . Allergic rhinitis     PT. DENIES  . Arthritis     NECK  . Cataract   . Thyroid disease   . Seizures   . Hypothyroidism   . Burning tongue syndrome 25 years  . Complication of anesthesia     low o2 sats, coded 30 years ago  . Stroke 5 years ago    Right side of face weak, slurred speach  . Gastric polyps   . Personal history of arterial venous malformation (AVM)     right side of face    Past Surgical History  Procedure Date  . Knee arthroscopy 11/14/2006    Right  . Tibial and fibular internal fixation     left  . Upper gastrointestinal endoscopy   . Colonoscopy   . Appendectomy 78 years old  . Cyst removed 35 years ago  . Total abdominal hysterectomy 78 years old  . Knee arthroscopy 5 and 6 years ago    both    Family History  Problem Relation Age of Onset  . Heart attack Father 27    fatal  . Coronary artery disease Brother   . Diabetes Brother    Social History:  reports that she quit smoking about 36 years ago. Her smoking use included Cigarettes. She has a 2.5 pack-year smoking history. She has never used smokeless tobacco. She reports that she drinks alcohol. She reports that she does not use illicit drugs.  Allergies: No Known Allergies  Medications Prior to Admission  Medication Sig Dispense Refill  . amitriptyline (ELAVIL) 50 MG tablet Take 50 mg by mouth at bedtime.      . cholecalciferol (VITAMIN D) 1000 UNITS tablet Take 1,000 Units by mouth daily.       Marland Kitchen escitalopram (LEXAPRO) 20 MG tablet Take 20 mg by mouth at bedtime.       . gabapentin (NEURONTIN) 300 MG capsule Take 300 mg by mouth 3 (three) times daily.      Marland Kitchen levothyroxine (SYNTHROID, LEVOTHROID) 50 MCG tablet Take 50 mcg by mouth daily before breakfast.       . lisinopril (PRINIVIL,ZESTRIL) 20 MG tablet Take 20 mg by mouth at bedtime.       . lovastatin (MEVACOR) 40 MG tablet Take 40 mg by mouth at bedtime.      . naproxen sodium (ANAPROX) 220 MG tablet Take 220 mg by mouth 2 (two) times daily as needed. Pain      . omeprazole (PRILOSEC) 40 MG capsule Take 40 mg by mouth daily.      . vitamin B-12 (CYANOCOBALAMIN) 1000 MCG tablet Take 1,000 mcg by mouth daily.        No results found for this or any previous visit (from the past 48 hour(s)). No results found.  ROS  Blood pressure 158/71, pulse 65, temperature 97.7 F (36.5 C), resp. rate 20, SpO2 95.00%. Physical Exam  Constitutional: She is oriented to  person, place, and time. She appears well-developed and well-nourished.  HENT:  Head: Normocephalic and atraumatic.  Right Ear: External ear normal.  Left Ear: External ear normal.  Nose: Nose normal.  Mouth/Throat: Oropharynx is clear and moist.  Eyes: Conjunctivae normal and EOM are normal. Pupils are equal, round, and reactive to light.  Neck: Normal range of motion. Neck supple.  Cardiovascular: Normal rate, regular rhythm, normal heart sounds and intact distal pulses.   Respiratory: Effort normal and breath sounds normal.  GI: Soft. Bowel sounds are normal.  Musculoskeletal: Normal range of motion. She exhibits tenderness.       Tender lateral joint line lt knee  Neurological: She is alert and oriented to person, place, and time. She has normal reflexes.  Skin: Skin is warm and dry.  Psychiatric: She has a normal mood and affect. Her behavior is normal. Judgment and thought content normal.     Assessment/Plan Torn lateral meniscus lt knee Left knee arthroscopy  with partial lateral meniscectomy  Evelynn Hench P 07/03/2012, 2:43 PM

## 2012-07-03 NOTE — Anesthesia Preprocedure Evaluation (Addendum)
Anesthesia Evaluation  Patient identified by MRN, date of birth, ID band Patient awake    Reviewed: Allergy & Precautions, H&P , NPO status , Patient's Chart, lab work & pertinent test results  Airway Mallampati: II TM Distance: <3 FB Neck ROM: Full    Dental No notable dental hx.    Pulmonary neg pulmonary ROS,  breath sounds clear to auscultation  Pulmonary exam normal       Cardiovascular hypertension, Pt. on medications Rhythm:Regular Rate:Normal     Neuro/Psych TIACVA negative psych ROS   GI/Hepatic Neg liver ROS, GERD-  Medicated,  Endo/Other  Hypothyroidism Morbid obesity  Renal/GU negative Renal ROS  negative genitourinary   Musculoskeletal negative musculoskeletal ROS (+)   Abdominal   Peds negative pediatric ROS (+)  Hematology negative hematology ROS (+)   Anesthesia Other Findings   Reproductive/Obstetrics negative OB ROS                           Anesthesia Physical Anesthesia Plan  ASA: III  Anesthesia Plan: General   Post-op Pain Management:    Induction: Intravenous  Airway Management Planned: LMA  Additional Equipment:   Intra-op Plan:   Post-operative Plan:   Informed Consent: I have reviewed the patients History and Physical, chart, labs and discussed the procedure including the risks, benefits and alternatives for the proposed anesthesia with the patient or authorized representative who has indicated his/her understanding and acceptance.   Dental advisory given  Plan Discussed with: CRNA and Surgeon  Anesthesia Plan Comments:         Anesthesia Quick Evaluation

## 2012-07-04 ENCOUNTER — Encounter (HOSPITAL_COMMUNITY): Payer: Self-pay | Admitting: Orthopedic Surgery

## 2012-07-04 MED ORDER — OXYCODONE-ACETAMINOPHEN 5-325 MG PO TABS: 1.0000 | ORAL_TABLET | ORAL | Status: DC | PRN

## 2012-07-04 MED ORDER — METHOCARBAMOL 500 MG PO TABS: 500.0000 mg | ORAL_TABLET | Freq: Four times a day (QID) | ORAL | Status: DC | PRN

## 2012-07-04 NOTE — Progress Notes (Signed)
Patient ID: [name], female   DOB: [birth year], 78 y.o.   MRN: [id number] Good night.  No pain.  NV intact.  WillD/C today with office return in3-4 days.  Inst given.

## 2012-07-04 NOTE — Evaluation (Signed)
Physical Therapy Evaluation Patient Details Name: [name] MRN: [id number] DOB: 1933/10/30 Today's Date: 07/04/2012 Time: 0850-0920 PT Time Calculation (min): 30 min  PT Assessment / Plan / Recommendation Clinical Impression  78 yo female s/p L knee scope and partial meniscectomy. Mobilizing fairly well within limitations. Reports 8/10 back pain limiting amb distance.Pt also has chronic balance issues. Reviewed ROM exercises-pt states she has exercise book at home that she can follow (from previous knee surgeries.) Pt states she will have follow-up PT at outpatient clinic after follow-up with MD. No futher actue PT needs at this time. Ready for d/c.     PT Assessment  All further PT needs can be met in the next venue of care ([facility])    Follow Up Recommendations  Outpatient PT (after follow-up with MD)    Does the patient have the potential to tolerate intense rehabilitation      Barriers to Discharge        Equipment Recommendations  None recommended by PT    Recommendations for Other Services     Frequency      Precautions / Restrictions Precautions Precautions: Fall Precaution Comments: pt has chronic cerebellar disorder significantly affecting balance. Hx of multiple falls due to this.  Restrictions Weight Bearing Restrictions: No LLE Weight Bearing: Weight bearing as tolerated   Pertinent Vitals/Pain 8/10 back      Mobility  Bed Mobility Bed Mobility: Supine to Sit Supine to Sit: 4: Min guard Details for Bed Mobility Assistance: Increased time.  Transfers Transfers: Sit to Stand;Stand to [employer] Transfers Sit to Stand: 4: Min assist;From bed;With upper extremity assist Stand to Sit: 4: Min assist;To chair/3-in-1;With armrests;With upper extremity assist Details for Transfer Assistance: VCs safety, technique, hand placement. Assist to rise, stabilize, control descent. Increased posterior swaying requiring assist to prevent LOB at times.   Ambulation/Gait Ambulation/Gait Assistance: 4: Min assist;4: Min Government social research officer (Feet): 35 Feet Assistive device: Rolling walker Ambulation/Gait Assistance Details: VCs safety, distance from RW. Limited distance due to back pain (chronic issue). Assist to stabilize intermittently.  Gait Pattern: Decreased stride length;Trunk flexed;Step-through pattern;Decreased step length - left;Decreased step length - right;Antalgic Stairs: No    Shoulder Instructions     Exercises General Exercises - Lower Extremity Ankle Circles/Pumps: AROM;Both;10 reps;Seated Quad Sets: AROM;Both;Seated Heel Slides: AAROM;Left;10 reps;Seated Hip ABduction/ADduction: AAROM;Left;Seated   PT Diagnosis: Difficulty walking;Abnormality of gait;Acute pain  PT Problem List: Decreased strength;Decreased range of motion;Decreased mobility;Pain PT Treatment Interventions:     PT Goals    Visit Information  Last PT Received On: 07/04/12 Assistance Needed: +1    Subjective Data  Subjective: "Oh yes, my husband will help me" Patient Stated Goal: home   Prior Functioning  Home Living Lives With: Spouse Available Help at Discharge: Family Type of Home: House Home Access: Ramped entrance Home Layout: Multi-level;Able to live on main level with bedroom/bathroom Home Adaptive Equipment: Walker - rolling;Walker - four wheeled;Shower chair with back;Bedside commode/3-in-1;Wheelchair - manual Prior Function Level of Independence: Needs assistance;Independent with assistive device(s) Needs Assistance: Gait Gait Assistance: needs assist ambulating up ramp. Otherwise pt is Mod I with use of rollator Able to Take Stairs?: No Communication Communication: No difficulties    Cognition  Overall Cognitive Status: Appears within functional limits for tasks assessed/performed Arousal/Alertness: Awake/alert Orientation Level: Appears intact for tasks assessed Behavior During Session: [facility] for tasks performed     Extremity/Trunk Assessment Right Lower Extremity Assessment RLE ROM/Strength/Tone: [facility] for tasks assessed Left Lower Extremity Assessment LLE ROM/Strength/Tone:  Deficits LLE ROM/Strength/Tone Deficits: Decreased knee flexion ~50-60 degrees sitting EOB LLE Sensation: WFL - Light Touch   Balance    End of Session PT - End of Session Equipment Utilized During Treatment: Gait belt Activity Tolerance: Patient limited by pain Patient left: in chair;with call bell/phone within reach  GP Functional Assessment Tool Used: clinical judgement Functional Limitation: Mobility: Walking and moving around Mobility: Walking and Moving Around Current Status (Z6109): At least 20 percent but less than 40 percent impaired, limited or restricted Mobility: Walking and Moving Around Discharge Status [phone]): At least 20 percent but less than 40 percent impaired, limited or restricted   [name] Alert [facility] 07/04/2012, 9:34 AM [phone]

## 2012-07-04 NOTE — Discharge Summary (Signed)
NAMEANNALENA, PIATT NO.:  [id number]  MEDICAL RECORD NO.:  [id number]  LOCATION:  1602                         FACILITY:  [facility]  PHYSICIAN:  Marlowe Kays, M.D.  DATE OF BIRTH:  [birth year]  DATE OF ADMISSION:  07/03/2012 DATE OF DISCHARGE:  07/04/2012                              DISCHARGE SUMMARY   ADMITTING DIAGNOSES: 1. Torn lateral meniscus. 2. Osteoarthritis, left knee.  DISCHARGE DIAGNOSES: 1. Torn medial and lateral menisci. 2. Osteoarthritis, left knee.  OPERATION:  On July 03, 2012 1. Left knee arthroscopy with partial medial and lateral     meniscectomies. 2. Shaving, medial and lateral femoral condyles, and patella. 3. Removal of large osteochondral loose body, lateral joint.  SUMMARY:  She is a 20 and has some other health issues including balance problems and what normally would be a non-overnight stay operation was done keeping overnight for safety purposes.  The surgery itself went uneventfully and postoperatively this morning, she is afebrile and is having no postop pain, and is ready to go home.  Neurovascular function is intact in her foot.  She was given discharge instructions with a plan to see her back in my office in roughly 3-4 days.  Prescriptions for Norco 5/325 and Robaxin will be given.  CONDITION AT DISCHARGE:  Stable and improved.          ______________________________ Marlowe Kays, M.D.     JA/MEDQ  D:  07/04/2012  T:  07/04/2012  Job:  [id number]

## 2012-07-04 NOTE — Op Note (Signed)
[name], [name] NO.:  [id number]  MEDICAL RECORD NO.:  [id number]  LOCATION:  1602                         FACILITY:  [facility]  PHYSICIAN:  Marlowe Kays, M.D.  DATE OF BIRTH:  1934/07/14  DATE OF PROCEDURE:  07/03/2012 DATE OF DISCHARGE:                              OPERATIVE REPORT   PREOPERATIVE DIAGNOSES: 1. Torn lateral meniscus. 2. Tricompartmental arthritis, left knee.  POSTOPERATIVE DIAGNOSES: 1. Torn medial and lateral menisci. 2. Large loose osteochondral fragment and lateral joint. 3. Tricompartmental arthritis, left knee.  OPERATION: 1. Left knee arthroscopy with one partial medial and lateral     meniscectomy. 2. Shaving of medial and lateral femoral condyles. 3. Removal of large osteochondral fragment and lateral joint.  SURGEON:  Marlowe Kays, M.D.  ASSISTANT:  Nurse.  ANESTHESIA:  General.  PATHOLOGY AND JUSTIFICATION FOR PROCEDURE:  Painful left knee with an MRI demonstrating the complex tear of the lateral meniscus and the tricompartmental arthritis, but not the medial tear.  PROCEDURE:  Satisfied general anesthesia, Ace wrap, and knee support to right lower extremity, pneumatic tourniquet was applied to the left lower extremity with the leg Esmarch out non-sterilely and tourniquet inflated to 300 mmHg.  Thigh stabilizer applied and the leg was prepped with DuraPrep from stabilizer to ankle and draped in a sterile field. Time-out performed.  I first deflated the joint through an anterolateral portal and then tried to get at superomedial and superolateral portals, but in each case, I was unable to get any fluid to come back, even though, there was fluid clearly in the joint.  I summarized from this that her suprapatellar area most had been fibrosed down and released, clogged with soft material.  Accordingly, I went ahead and placed the inflow irrigant through one of the two arms of the arthroscope with the arm being used  for egress initially, but subsequently, I plugged it up so that we could get inflow through the arthroscope.  First from anterolateral portal, the medial compartment of knee joint was evaluated and found extensive tearing throughout the entire medial meniscus, dissecting this back to stable rim with baskets and a 3.5 shaver.  I also slightly shaved the medial femoral condyle.  Looking up in the medial gutter and suprapatellar area, I was unable to make this passage. Consequently, I reversed the portals.  The lateral compartment of the joint was very disturbed in keeping with the MRI.  She had grade 3/4 chondromalacia of the lateral femoral condyle, which I debrided down. She also had extensive maceration and disruption of the entire lateral meniscus, which I trimmed back to stable rim with a combination of baskets and the 3.5 shaver.  During this process, I found the large osteochondral body in the joint, which I pictured and then removed with the pituitary rongeur.  Looking up at the lateral gutter and suprapatellar area, I shaved her patella, which was worn.  Indeed, she did have a large amount of fibrous tissue preventing good penetration of the inflow into the joint proper.  I removed all fluid from the joint and then closed the four portal sites with interrupted 4-0 nylon mattress sutures, injecting through the lateral portal side,  20 mL of 0.5% Marcaine with adrenaline, 4 mg of morphine.  Tourniquet was released.  She tolerated the procedure well, was taken to the recovery room in satisfactory condition with no known complication.          ______________________________ Marlowe Kays, M.D.     JA/MEDQ  D:  07/03/2012  T:  07/04/2012  Job:  [id number]

## 2012-07-04 NOTE — Progress Notes (Signed)
Utilization review completed.  

## 2012-07-05 NOTE — Care Management Note (Signed)
    Page 1 of 1   07/05/2012     6:07:29 PM   CARE MANAGEMENT NOTE 07/05/2012  Patient:  [name], [name]   Account Number:  [id number]  Date Initiated:  07/04/2012  Documentation initiated by:  Colleen Can  Subjective/Objective Assessment:   dx left knee arthroscopy with partial medial and lateral menisectomy     Action/Plan:   home upon discharge; no hh services or dme required   Anticipated DC Date:     Anticipated DC Plan:  HOME/SELF CARE         Choice offered to / List presented to:             Status of service:  Completed, signed off Medicare Important Message given?   (If response is "NO", the following Medicare IM given date fields will be blank) Date Medicare IM given:   Date Additional Medicare IM given:    Discharge Disposition:    Per UR Regulation:    If discussed at Long Length of Stay Meetings, dates discussed:    Comments:

## 2012-08-18 NOTE — Progress Notes (Signed)
PT Evaluation and Discharge - late entry Late entry for G-codes   07/24/12 0933  PT G-Codes **NOT FOR INPATIENT CLASS**  Functional Assessment Tool Used clinical judgement  Functional Limitation Mobility: Walking and moving around  Mobility: Walking and Moving Around Current Status (Z6109) CJ  Mobility: Walking and Moving Around Goal Status (U0454) CJ  Mobility: Walking and Moving Around Discharge Status [phone]) CJ    08/18/2012 Corlis Hove, PT [phone]

## 2012-09-22 DIAGNOSIS — R5381 Other malaise: Secondary | ICD-10-CM | POA: Diagnosis not present

## 2012-09-22 DIAGNOSIS — E039 Hypothyroidism, unspecified: Secondary | ICD-10-CM | POA: Diagnosis not present

## 2012-09-22 DIAGNOSIS — E0590 Thyrotoxicosis, unspecified without thyrotoxic crisis or storm: Secondary | ICD-10-CM | POA: Diagnosis not present

## 2012-09-22 DIAGNOSIS — K219 Gastro-esophageal reflux disease without esophagitis: Secondary | ICD-10-CM | POA: Diagnosis not present

## 2012-09-22 DIAGNOSIS — I10 Essential (primary) hypertension: Secondary | ICD-10-CM | POA: Diagnosis not present

## 2012-09-22 DIAGNOSIS — E785 Hyperlipidemia, unspecified: Secondary | ICD-10-CM | POA: Diagnosis not present

## 2012-09-22 DIAGNOSIS — E559 Vitamin D deficiency, unspecified: Secondary | ICD-10-CM | POA: Diagnosis not present

## 2012-11-13 DIAGNOSIS — I6789 Other cerebrovascular disease: Secondary | ICD-10-CM | POA: Diagnosis not present

## 2012-11-13 DIAGNOSIS — R079 Chest pain, unspecified: Secondary | ICD-10-CM | POA: Diagnosis not present

## 2012-11-13 DIAGNOSIS — I69993 Ataxia following unspecified cerebrovascular disease: Secondary | ICD-10-CM | POA: Diagnosis not present

## 2012-11-13 DIAGNOSIS — E782 Mixed hyperlipidemia: Secondary | ICD-10-CM | POA: Diagnosis not present

## 2012-11-17 ENCOUNTER — Other Ambulatory Visit (HOSPITAL_COMMUNITY): Payer: Self-pay | Admitting: Cardiovascular Disease

## 2012-11-17 DIAGNOSIS — M7122 Synovial cyst of popliteal space [Baker], left knee: Secondary | ICD-10-CM

## 2012-11-17 DIAGNOSIS — I309 Acute pericarditis, unspecified: Secondary | ICD-10-CM

## 2012-11-17 DIAGNOSIS — I351 Nonrheumatic aortic (valve) insufficiency: Secondary | ICD-10-CM

## 2012-11-17 DIAGNOSIS — I824Z2 Acute embolism and thrombosis of unspecified deep veins of left distal lower extremity: Secondary | ICD-10-CM

## 2012-11-25 ENCOUNTER — Ambulatory Visit (HOSPITAL_COMMUNITY)
Admission: RE | Admit: 2012-11-25 | Discharge: 2012-11-25 | Disposition: A | Discharge status: Home / Self Care | Payer: Medicare Other | Source: Ambulatory Visit | Attending: Cardiovascular Disease | Admitting: Cardiovascular Disease

## 2012-11-25 DIAGNOSIS — I359 Nonrheumatic aortic valve disorder, unspecified: Secondary | ICD-10-CM | POA: Diagnosis not present

## 2012-11-25 DIAGNOSIS — I824Z2 Acute embolism and thrombosis of unspecified deep veins of left distal lower extremity: Secondary | ICD-10-CM

## 2012-11-25 DIAGNOSIS — I82409 Acute embolism and thrombosis of unspecified deep veins of unspecified lower extremity: Secondary | ICD-10-CM | POA: Insufficient documentation

## 2012-11-25 DIAGNOSIS — M79609 Pain in unspecified limb: Secondary | ICD-10-CM | POA: Diagnosis not present

## 2012-11-25 DIAGNOSIS — M7120 Synovial cyst of popliteal space [Baker], unspecified knee: Secondary | ICD-10-CM | POA: Insufficient documentation

## 2012-11-25 DIAGNOSIS — I309 Acute pericarditis, unspecified: Secondary | ICD-10-CM

## 2012-11-25 DIAGNOSIS — M7989 Other specified soft tissue disorders: Secondary | ICD-10-CM | POA: Diagnosis not present

## 2012-11-25 DIAGNOSIS — I351 Nonrheumatic aortic (valve) insufficiency: Secondary | ICD-10-CM

## 2012-11-25 DIAGNOSIS — M7122 Synovial cyst of popliteal space [Baker], left knee: Secondary | ICD-10-CM

## 2012-11-25 NOTE — Progress Notes (Signed)
[facility]   2D echo completed 11/25/2012.   Cindy Reggie Bise, RDCS  

## 2012-11-25 NOTE — Progress Notes (Signed)
Left lower extremity venous duplex doppler was completed. Larene Pickett RVT

## 2012-11-26 ENCOUNTER — Telehealth: Payer: Self-pay | Admitting: Nurse Practitioner

## 2012-11-26 ENCOUNTER — Ambulatory Visit (INDEPENDENT_AMBULATORY_CARE_PROVIDER_SITE_OTHER): Payer: Medicare Other | Admitting: General Practice

## 2012-11-26 VITALS — BP 126/65 | HR 76 | Temp 96.7°F | Ht 63.5 in | Wt 217.0 lb

## 2012-11-26 DIAGNOSIS — R221 Localized swelling, mass and lump, neck: Secondary | ICD-10-CM | POA: Diagnosis not present

## 2012-11-26 DIAGNOSIS — R220 Localized swelling, mass and lump, head: Secondary | ICD-10-CM | POA: Diagnosis not present

## 2012-11-26 DIAGNOSIS — N390 Urinary tract infection, site not specified: Secondary | ICD-10-CM | POA: Diagnosis not present

## 2012-11-26 DIAGNOSIS — R350 Frequency of micturition: Secondary | ICD-10-CM

## 2012-11-26 LAB — POCT URINALYSIS DIPSTICK
Bilirubin, UA: NEGATIVE
Ketones, UA: NEGATIVE
Protein, UA: NEGATIVE

## 2012-11-26 LAB — POCT UA - MICROSCOPIC ONLY: Crystals, Ur, HPF, POC: NEGATIVE

## 2012-11-26 MED ORDER — SULFAMETHOXAZOLE-TRIMETHOPRIM 800-160 MG PO TABS: 1.0000 | ORAL_TABLET | Freq: Two times a day (BID) | ORAL | Status: DC

## 2012-11-26 NOTE — Patient Instructions (Addendum)
Urinary Tract Infection Urinary tract infections (UTIs) can develop anywhere along your urinary tract. Your urinary tract is your body's drainage system for removing wastes and extra water. Your urinary tract includes two kidneys, two ureters, a bladder, and a urethra. Your kidneys are a pair of bean-shaped organs. Each kidney is about the size of your fist. They are located below your ribs, one on each side of your spine. CAUSES Infections are caused by microbes, which are microscopic organisms, including fungi, viruses, and bacteria. These organisms are so small that they can only be seen through a microscope. Bacteria are the microbes that most commonly cause UTIs. SYMPTOMS  Symptoms of UTIs may vary by age and gender of the patient and by the location of the infection. Symptoms in young women typically include a frequent and intense urge to urinate and a painful, burning feeling in the bladder or urethra during urination. Older women and men are more likely to be tired, shaky, and weak and have muscle aches and abdominal pain. A fever may mean the infection is in your kidneys. Other symptoms of a kidney infection include pain in your back or sides below the ribs, nausea, and vomiting. DIAGNOSIS To diagnose a UTI, your caregiver will ask you about your symptoms. Your caregiver also will ask to provide a urine sample. The urine sample will be tested for bacteria and white blood cells. White blood cells are made by your body to help fight infection. TREATMENT  Typically, UTIs can be treated with medication. Because most UTIs are caused by a bacterial infection, they usually can be treated with the use of antibiotics. The choice of antibiotic and length of treatment depend on your symptoms and the type of bacteria causing your infection. HOME CARE INSTRUCTIONS  If you were prescribed antibiotics, take them exactly as your caregiver instructs you. Finish the medication even if you feel better after you  have only taken some of the medication.  Drink enough water and fluids to keep your urine clear or pale yellow.  Avoid caffeine, tea, and carbonated beverages. They tend to irritate your bladder.  Empty your bladder often. Avoid holding urine for long periods of time.  Empty your bladder before and after sexual intercourse.  After a bowel movement, women should cleanse from front to back. Use each tissue only once. SEEK MEDICAL CARE IF:   You have back pain.  You develop a fever.  Your symptoms do not begin to resolve within 3 days. SEEK IMMEDIATE MEDICAL CARE IF:   You have severe back pain or lower abdominal pain.  You develop chills.  You have nausea or vomiting.  You have continued burning or discomfort with urination. MAKE SURE YOU:   Understand these instructions.  Will watch your condition.  Will get help right away if you are not doing well or get worse. Document Released: 04/25/2005 Document Revised: 01/15/2012 Document Reviewed: 08/24/2011 Exit[employer] Patient Information 2013 [employer].  

## 2012-11-26 NOTE — Progress Notes (Signed)
  Subjective:    Patient ID: [name], female    DOB: 12/02/1933, 79 y.o.   MRN: [id number]  HPI Presents today with uti symptoms. OTC AZO taken with minimal relief. Reports onset of burning and frequency with urination was Friday. Patient also reports left neck mass. Denies pain of discomfort with mass.     Review of Systems  Constitutional: Negative for fever and chills.  HENT: Negative for neck pain and neck stiffness.   Respiratory: Negative for chest tightness and shortness of breath.   Cardiovascular: Negative for chest pain.  Gastrointestinal: Negative for abdominal distention.  Genitourinary: Positive for dysuria. Negative for vaginal bleeding and difficulty urinating.  Skin: Negative for rash.  Neurological: Negative for dizziness, syncope and headaches.  Psychiatric/Behavioral: Negative.        Objective:   Physical Exam  Constitutional: She is oriented to person, place, and time. She appears well-developed and well-nourished.  Cardiovascular: Normal rate, regular rhythm and normal heart sounds.   No murmur heard. Left neck pulsatile mass noted  Pulmonary/Chest: Effort normal and breath sounds normal. No respiratory distress. She exhibits no tenderness.  Abdominal: There is tenderness in the suprapubic area.  Neurological: She is alert and oriented to person, place, and time.  Skin: Skin is warm. She is diaphoretic.   Results for orders placed in visit on 11/26/12  POCT UA - MICROSCOPIC ONLY      Result Value Range   WBC, Ur, HPF, POC tntc     RBC, urine, microscopic 10-25     Bacteria, U Microscopic many     Mucus, UA mod     Epithelial cells, urine per micros few     Crystals, Ur, HPF, POC neg     Casts, Ur, LPF, POC neg     Yeast, UA neg    POCT URINALYSIS DIPSTICK      Result Value Range   Color, UA orange     Clarity, UA clear     Glucose, UA neg     Bilirubin, UA neg     Ketones, UA neg     Spec Grav, UA       Blood, UA mod     pH, UA       Protein, UA neg     Urobilinogen, UA negative     Nitrite, UA neg     Leukocytes, UA large (3+)           Assessment & Plan:  Refer for ultrasound doppler of pulsatile left neck mass Take antibiotics as prescribed and complete full course even if feeling better Increase fluid intake Frequent voiding Proper perineal hygiene Call 911 symptoms develop related to neck mass or visit nearest ER RTO if symptoms worsen from UTI Patient verbalized understanding Raymon Mutton, FNP-C

## 2012-11-26 NOTE — Telephone Encounter (Signed)
appt made

## 2012-11-29 LAB — URINE CULTURE

## 2012-12-03 ENCOUNTER — Telehealth: Payer: Self-pay

## 2012-12-03 NOTE — Telephone Encounter (Signed)
Patient says she was seen in April and was supposed to be scheduled for a test on her neck  Wants to go to [name], [place]

## 2012-12-05 ENCOUNTER — Other Ambulatory Visit: Payer: Self-pay | Admitting: General Practice

## 2012-12-05 ENCOUNTER — Telehealth: Payer: Self-pay | Admitting: *Deleted

## 2012-12-05 DIAGNOSIS — R220 Localized swelling, mass and lump, head: Secondary | ICD-10-CM | POA: Diagnosis not present

## 2012-12-05 DIAGNOSIS — R221 Localized swelling, mass and lump, neck: Secondary | ICD-10-CM

## 2012-12-05 DIAGNOSIS — I771 Stricture of artery: Secondary | ICD-10-CM | POA: Diagnosis not present

## 2012-12-05 NOTE — Telephone Encounter (Signed)
LEFT MESSAGE TO RETURN CALL.  (TEST WAS NEGATIVE FOR ANEURISM.)

## 2012-12-05 NOTE — Telephone Encounter (Signed)
pT IS AWARE OF RESULTS

## 2012-12-17 DIAGNOSIS — Z029 Encounter for administrative examinations, unspecified: Secondary | ICD-10-CM

## 2013-03-24 ENCOUNTER — Ambulatory Visit: Payer: Medicare Other | Admitting: Nurse Practitioner

## 2013-05-11 ENCOUNTER — Encounter: Payer: Self-pay | Admitting: Cardiovascular Disease

## 2013-05-11 ENCOUNTER — Ambulatory Visit (INDEPENDENT_AMBULATORY_CARE_PROVIDER_SITE_OTHER): Payer: Medicare Other | Admitting: Cardiovascular Disease

## 2013-05-11 VITALS — BP 140/82 | HR 80 | Ht 64.0 in | Wt 217.0 lb

## 2013-05-11 DIAGNOSIS — Z79899 Other long term (current) drug therapy: Secondary | ICD-10-CM

## 2013-05-11 DIAGNOSIS — E785 Hyperlipidemia, unspecified: Secondary | ICD-10-CM | POA: Diagnosis not present

## 2013-05-11 DIAGNOSIS — R220 Localized swelling, mass and lump, head: Secondary | ICD-10-CM

## 2013-05-11 DIAGNOSIS — R221 Localized swelling, mass and lump, neck: Secondary | ICD-10-CM

## 2013-05-11 NOTE — Assessment & Plan Note (Signed)
On statin therapy. We will recheck a lipid and liver profile 

## 2013-05-11 NOTE — Patient Instructions (Signed)
  Your physician wants you to follow-up with him in : 1 year with Dr Berry                                                                You will receive a reminder letter in the mail one month in advance. If you don't receive a letter, please call our office to schedule the follow-up appointment.   Your physician recommends that you return for lab work in: 1-2 weeks, fasting    Your physician has ordered the following tests: carotid dopplers  

## 2013-05-11 NOTE — Assessment & Plan Note (Signed)
Well-controlled on current medications 

## 2013-05-11 NOTE — Progress Notes (Signed)
05/11/2013 [name]   May 17, 1934  [id number]  Primary Physician Rebekah Heap, MD Primary Cardiologist: Rebekah Gess MD [name]   HPI:  Mr. [name] is a 79 year old moderately overweight married Caucasian female mother of 49 (son 50 years old died 3/2 years ago prostate CA), grandmother to a grandchildren was formally a patient of Dr. Gerlene Paul [name]. She has no prior cardiac history. She does have a history of treated hypertension and hyperlipidemia as well as a family history of heart disease. Her father died at age 3 from a heart attack as did her brother. She has another brother who is in open-heart surgery. She has never had a heart attack but has had "mini strokes" in the past. She does get dyspnea on exertion but denies chest pain.   Current Outpatient Prescriptions  Medication Sig Dispense Refill  . amitriptyline (ELAVIL) 50 MG tablet Take 50 mg by mouth at bedtime.      Marland Kitchen escitalopram (LEXAPRO) 20 MG tablet Take 20 mg by mouth at bedtime.       . gabapentin (NEURONTIN) 300 MG capsule Take 300 mg by mouth 3 (three) times daily.      Marland Kitchen levothyroxine (SYNTHROID, LEVOTHROID) 50 MCG tablet Take 50 mcg by mouth daily before breakfast.       . lisinopril (PRINIVIL,ZESTRIL) 20 MG tablet Take 20 mg by mouth at bedtime.       . lovastatin (MEVACOR) 40 MG tablet Take 40 mg by mouth at bedtime.      Marland Kitchen omeprazole (PRILOSEC) 40 MG capsule Take 40 mg by mouth daily.       No current facility-administered medications for this visit.    No Known Allergies  History   Social History  . Marital Status: Married    Spouse Name: N/A    Number of Children: N/A  . Years of Education: N/A   Occupational History  . Not on file.   Social History Main Topics  . Smoking status: Former Smoker -- 0.25 packs/day for 10 years    Types: Cigarettes    Quit date: 07/31/1975  . Smokeless tobacco: Never Used  . Alcohol Use: Yes     Comment: 2 glasses a year  . Drug Use:  No  . Sexual Activity: Not on file   Other Topics Concern  . Not on file   Social History Narrative  . No narrative on file     Review of Systems: General: negative for chills, fever, night sweats or weight changes.  Cardiovascular: negative for chest pain, dyspnea on exertion, edema, orthopnea, palpitations, paroxysmal nocturnal dyspnea or shortness of breath Dermatological: negative for rash Respiratory: negative for cough or wheezing Urologic: negative for hematuria Abdominal: negative for nausea, vomiting, diarrhea, bright red blood per rectum, melena, or hematemesis Neurologic: negative for visual changes, syncope, or dizziness All other systems reviewed and are otherwise negative except as noted above.    Blood pressure 140/82, pulse 80, height 5\' 4"  (1.626 m), weight 217 lb (98.431 kg).  General appearance: alert and no distress Neck: no adenopathy, no carotid bruit, no JVD, supple, symmetrical, trachea midline, thyroid not enlarged, symmetric, no tenderness/mass/nodules and pulses the left carotid artery Lungs: clear to auscultation bilaterally Heart: regular rate and rhythm, S1, S2 normal, no murmur, click, rub or gallop Extremities: extremities normal, atraumatic, no cyanosis or edema  EKG normal sinus rhythm at 80 without ST or T wave changes  ASSESSMENT AND PLAN:   HYPERLIPIDEMIA On statin therapy. We  will recheck a lipid and liver profile  HYPERTENSION Well-controlled on current medications      Rebekah Gess MD [facility], [facility] 05/11/2013 2:16 PM

## 2013-05-18 ENCOUNTER — Ambulatory Visit (HOSPITAL_COMMUNITY)
Admission: RE | Admit: 2013-05-18 | Discharge: 2013-05-18 | Disposition: A | Discharge status: Home / Self Care | Payer: Medicare Other | Source: Ambulatory Visit | Attending: Cardiovascular Disease | Admitting: Cardiovascular Disease

## 2013-05-18 DIAGNOSIS — R221 Localized swelling, mass and lump, neck: Secondary | ICD-10-CM

## 2013-05-18 DIAGNOSIS — R220 Localized swelling, mass and lump, head: Secondary | ICD-10-CM | POA: Insufficient documentation

## 2013-05-18 DIAGNOSIS — R0989 Other specified symptoms and signs involving the circulatory and respiratory systems: Secondary | ICD-10-CM

## 2013-05-18 NOTE — Progress Notes (Signed)
Carotid Duplex Completed. Baylen Buckner, BS, RDMS, RVT  

## 2013-05-22 ENCOUNTER — Encounter: Payer: Self-pay | Admitting: *Deleted

## 2013-05-27 ENCOUNTER — Ambulatory Visit (INDEPENDENT_AMBULATORY_CARE_PROVIDER_SITE_OTHER): Payer: Medicare Other

## 2013-05-27 DIAGNOSIS — Z23 Encounter for immunization: Secondary | ICD-10-CM

## 2013-06-03 ENCOUNTER — Ambulatory Visit (INDEPENDENT_AMBULATORY_CARE_PROVIDER_SITE_OTHER): Payer: Medicare Other | Admitting: Family Medicine

## 2013-06-03 ENCOUNTER — Encounter: Payer: Self-pay | Admitting: Family Medicine

## 2013-06-03 VITALS — BP 152/76 | HR 80 | Temp 96.5°F | Ht 64.0 in | Wt 207.0 lb

## 2013-06-03 DIAGNOSIS — K148 Other diseases of tongue: Secondary | ICD-10-CM | POA: Diagnosis not present

## 2013-06-03 MED ORDER — FIRST-DUKES MOUTHWASH MT SUSP: 5.0000 mL | Freq: Four times a day (QID) | OROMUCOSAL | Status: DC | PRN

## 2013-06-03 NOTE — Progress Notes (Signed)
  Subjective:    Patient ID: [name], female    DOB: 11/04/1933, 79 y.o.   MRN: [id number]  HPI This 79 y.o. female presents for evaluation of lesion on the tip of her toungue that has been present For 4 months and is getting bigger and is uncomfortable..   Review of Systems C/o oral lesion No chest pain, SOB, HA, dizziness, vision change, N/V, diarrhea, constipation, dysuria, urinary urgency or frequency, myalgias, arthralgias or rash.     Objective:   Physical Exam Vital signs noted  Well developed well nourished female.  HEENT - Head atraumatic Normocephalic                Eyes - PERRLA, Conjuctiva - clear Sclera- Clear EOMI                Ears - EAC's Wnl TM's Wnl Gross Hearing WNL                Nose - Nares patent                 Throat - oropharanx wnl Respiratory - Lungs CTA bilateral Cardiac - RRR S1 and S2 without murmur GI - Abdomen soft Nontender and bowel sounds active x 4 Extremities - No edema. Neuro - Grossly intact.       Assessment & Plan:

## 2013-07-16 DIAGNOSIS — K1370 Unspecified lesions of oral mucosa: Secondary | ICD-10-CM | POA: Diagnosis not present

## 2013-07-28 DIAGNOSIS — K1370 Unspecified lesions of oral mucosa: Secondary | ICD-10-CM | POA: Diagnosis not present

## 2013-09-02 ENCOUNTER — Ambulatory Visit (INDEPENDENT_AMBULATORY_CARE_PROVIDER_SITE_OTHER): Payer: Medicare Other | Admitting: Family Medicine

## 2013-09-02 ENCOUNTER — Encounter: Payer: Self-pay | Admitting: Family Medicine

## 2013-09-02 VITALS — BP 141/69 | HR 74 | Temp 96.9°F | Wt 223.2 lb

## 2013-09-02 DIAGNOSIS — M129 Arthropathy, unspecified: Secondary | ICD-10-CM

## 2013-09-02 DIAGNOSIS — I10 Essential (primary) hypertension: Secondary | ICD-10-CM | POA: Diagnosis not present

## 2013-09-02 DIAGNOSIS — E785 Hyperlipidemia, unspecified: Secondary | ICD-10-CM

## 2013-09-02 DIAGNOSIS — E039 Hypothyroidism, unspecified: Secondary | ICD-10-CM

## 2013-09-02 DIAGNOSIS — F32A Depression, unspecified: Secondary | ICD-10-CM

## 2013-09-02 DIAGNOSIS — F3289 Other specified depressive episodes: Secondary | ICD-10-CM

## 2013-09-02 DIAGNOSIS — M1990 Unspecified osteoarthritis, unspecified site: Secondary | ICD-10-CM

## 2013-09-02 DIAGNOSIS — K219 Gastro-esophageal reflux disease without esophagitis: Secondary | ICD-10-CM

## 2013-09-02 DIAGNOSIS — E559 Vitamin D deficiency, unspecified: Secondary | ICD-10-CM | POA: Diagnosis not present

## 2013-09-02 DIAGNOSIS — F329 Major depressive disorder, single episode, unspecified: Secondary | ICD-10-CM

## 2013-09-02 LAB — POCT CBC
Granulocyte percent: 55.4 %G (ref 37–80)
HCT, POC: 43.3 % (ref 37.7–47.9)
Hemoglobin: 14 g/dL (ref 12.2–16.2)
Lymph, poc: 2.1 (ref 0.6–3.4)
MCH, POC: 29 pg (ref 27–31.2)
MCHC: 32.4 g/dL (ref 31.8–35.4)
MCV: 89.5 fL (ref 80–97)
MPV: 7.2 fL (ref 0–99.8)
POC Granulocyte: 3.3 (ref 2–6.9)
POC LYMPH PERCENT: 35.9 %L (ref 10–50)
Platelet Count, POC: 225 10*3/uL (ref 142–424)
RBC: 4.8 M/uL (ref 4.04–5.48)
RDW, POC: 14.3 %
WBC: 5.9 10*3/uL (ref 4.6–10.2)

## 2013-09-02 MED ORDER — LISINOPRIL 20 MG PO TABS: 20.0000 mg | ORAL_TABLET | Freq: Every day | ORAL | Status: DC

## 2013-09-02 MED ORDER — ESCITALOPRAM OXALATE 20 MG PO TABS: 20.0000 mg | ORAL_TABLET | Freq: Every day | ORAL | Status: DC

## 2013-09-02 MED ORDER — LOVASTATIN 40 MG PO TABS: 40.0000 mg | ORAL_TABLET | Freq: Every day | ORAL | Status: DC

## 2013-09-02 MED ORDER — GABAPENTIN 300 MG PO CAPS: 300.0000 mg | ORAL_CAPSULE | Freq: Three times a day (TID) | ORAL | Status: DC

## 2013-09-02 MED ORDER — OMEPRAZOLE 40 MG PO CPDR: 40.0000 mg | DELAYED_RELEASE_CAPSULE | Freq: Every day | ORAL | Status: DC

## 2013-09-02 MED ORDER — LEVOTHYROXINE SODIUM 50 MCG PO TABS: 50.0000 ug | ORAL_TABLET | Freq: Every day | ORAL | Status: DC

## 2013-09-02 MED ORDER — AMITRIPTYLINE HCL 50 MG PO TABS: 50.0000 mg | ORAL_TABLET | Freq: Every day | ORAL | Status: DC

## 2013-09-03 ENCOUNTER — Other Ambulatory Visit: Payer: Self-pay | Admitting: Family Medicine

## 2013-09-03 LAB — CMP14+EGFR
ALT: 12 IU/L (ref 0–32)
AST: 18 IU/L (ref 0–40)
Albumin/Globulin Ratio: 1.9 (ref 1.1–2.5)
Albumin: 4.5 g/dL (ref 3.5–4.8)
Alkaline Phosphatase: 71 IU/L (ref 39–117)
BUN/Creatinine Ratio: 15 (ref 11–26)
BUN: 12 mg/dL (ref 8–27)
CO2: 25 mmol/L (ref 18–29)
Calcium: 9.8 mg/dL (ref 8.7–10.3)
Chloride: 101 mmol/L (ref 97–108)
Creatinine, Ser: 0.81 mg/dL (ref 0.57–1.00)
GFR calc Af Amer: 81 mL/min/{1.73_m2} (ref >59)
GFR calc non Af Amer: 70 mL/min/{1.73_m2} (ref >59)
Globulin, Total: 2.4 g/dL (ref 1.5–4.5)
Glucose: 97 mg/dL (ref 65–99)
Potassium: 5.3 mmol/L — ABNORMAL HIGH (ref 3.5–5.2)
Sodium: 142 mmol/L (ref 134–144)
Total Bilirubin: 0.3 mg/dL (ref 0.0–1.2)
Total Protein: 6.9 g/dL (ref 6.0–8.5)

## 2013-09-03 LAB — LIPID PANEL
Chol/HDL Ratio: 3.1 ratio units (ref 0.0–4.4)
Cholesterol, Total: 185 mg/dL (ref 100–199)
HDL: 59 mg/dL (ref >39)
LDL Calculated: 100 mg/dL — ABNORMAL HIGH (ref 0–99)
Triglycerides: 130 mg/dL (ref 0–149)
VLDL Cholesterol Cal: 26 mg/dL (ref 5–40)

## 2013-09-03 LAB — VITAMIN D 25 HYDROXY (VIT D DEFICIENCY, FRACTURES): Vit D, 25-Hydroxy: 26 ng/mL — ABNORMAL LOW (ref 30.0–100.0)

## 2013-09-03 LAB — TSH: TSH: 2.39 u[IU]/mL (ref 0.450–4.500)

## 2013-09-03 MED ORDER — VITAMIN D (ERGOCALCIFEROL) 1.25 MG (50000 UNIT) PO CAPS: 50000.0000 [IU] | ORAL_CAPSULE | ORAL | Status: DC

## 2013-09-03 NOTE — Progress Notes (Signed)
   Subjective:    Patient ID: [name], female    DOB: 03-12-1934, 80 y.o.   MRN: [id number]  HPI  This 80 y.o. female presents for evaluation of follow up on hypertension, GERD, and hyperlipidemia . She is needing refills on her medicine. Review of Systems    No chest pain, SOB, HA, dizziness, vision change, N/V, diarrhea, constipation, dysuria, urinary urgency or frequency, myalgias, arthralgias or rash.  Objective:   Physical Exam  Vital signs noted  Well developed well nourished female.  HEENT - Head atraumatic Normocephalic                Eyes - PERRLA, Conjuctiva - clear Sclera- Clear EOMI                Ears - EAC's Wnl TM's Wnl Gross Hearing WNL                Throat - oropharanx wnl Respiratory - Lungs CTA bilateral Cardiac - RRR S1 and S2 without murmur GI - Abdomen soft Nontender and bowel sounds active x 4 Extremities - No edema. Neuro - Grossly intact.      Assessment & Plan:  Arthritis - Plan: gabapentin (NEURONTIN) 300 MG capsule, amitriptyline (ELAVIL) 50 MG tablet, Vit D  25 hydroxy (rtn osteoporosis monitoring)  Unspecified hypothyroidism - Plan: levothyroxine (SYNTHROID, LEVOTHROID) 50 MCG tablet, TSH  Other and unspecified hyperlipidemia - Plan: lovastatin (MEVACOR) 40 MG tablet, Lipid panel, POCT CBC  GERD (gastroesophageal reflux disease) - Plan: omeprazole (PRILOSEC) 40 MG capsule  Essential hypertension, benign - Plan: lisinopril (PRINIVIL,ZESTRIL) 20 MG tablet, CMP14+EGFR, POCT CBC  Depression - Plan: escitalopram (LEXAPRO) 20 MG tablet  Lysbeth Penner FNP

## 2013-09-16 ENCOUNTER — Telehealth: Payer: Self-pay | Admitting: Family Medicine

## 2013-09-16 ENCOUNTER — Other Ambulatory Visit: Payer: Self-pay | Admitting: *Deleted

## 2013-09-16 DIAGNOSIS — E039 Hypothyroidism, unspecified: Secondary | ICD-10-CM

## 2013-09-16 MED ORDER — LEVOTHYROXINE SODIUM 50 MCG PO TABS: 50.0000 ug | ORAL_TABLET | Freq: Every day | ORAL | Status: DC

## 2013-09-16 NOTE — Telephone Encounter (Signed)
Patient aware.

## 2013-09-21 ENCOUNTER — Encounter: Payer: Self-pay | Admitting: Nurse Practitioner

## 2013-09-21 ENCOUNTER — Ambulatory Visit (INDEPENDENT_AMBULATORY_CARE_PROVIDER_SITE_OTHER): Payer: Medicare Other | Admitting: Nurse Practitioner

## 2013-09-21 VITALS — BP 146/80 | HR 86 | Temp 97.4°F | Ht 64.0 in | Wt 212.0 lb

## 2013-09-21 DIAGNOSIS — J209 Acute bronchitis, unspecified: Secondary | ICD-10-CM | POA: Diagnosis not present

## 2013-09-21 MED ORDER — METHYLPREDNISOLONE ACETATE 80 MG/ML IJ SUSP
80.0000 mg | Freq: Once | INTRAMUSCULAR | Status: AC
  Administered 2013-09-21: 80 mg via INTRAMUSCULAR

## 2013-09-21 MED ORDER — PREDNISONE 20 MG PO TABS: ORAL_TABLET | ORAL | Status: DC

## 2013-09-21 MED ORDER — AZITHROMYCIN 250 MG PO TABS: ORAL_TABLET | ORAL | Status: DC

## 2013-09-21 NOTE — Progress Notes (Signed)
   Subjective:    Patient ID: [name], female    DOB: Feb 03, 1934, 80 y.o.   MRN: [id number]  HPI Patient presents today complaining of cough and congestion. Symptoms began 2 weeks ago and include cough, wheezing. Denies fever Treatment includes Mucinex and Theraflu with no relief.   Review of Systems  Constitutional: Negative for fever and chills.  HENT: Negative for congestion, sinus pressure and sore throat.   Respiratory: Positive for cough and wheezing.   Cardiovascular: Negative for chest pain.  All other systems reviewed and are negative.       Objective:   Physical Exam  Constitutional: She is oriented to person, place, and time. She appears well-developed and well-nourished.  Cardiovascular: Normal rate, regular rhythm and normal heart sounds.   Pulmonary/Chest: She has wheezes.  Neurological: She is alert and oriented to person, place, and time.  Skin: Skin is warm and dry.  Psychiatric: She has a normal mood and affect. Her behavior is normal. Judgment and thought content normal.      BP 146/80  Pulse 86  Temp(Src) 97.4 F (36.3 C) (Oral)  Ht 5\' 4"  (1.626 m)  Wt 212 lb (96.163 kg)  BMI 36.37 kg/m2     Assessment & Plan:   1. Acute bronchitis     Meds ordered this encounter  Medications  . azithromycin (ZITHROMAX Z-PAK) 250 MG tablet    Sig: As directed    Dispense:  6 each    Refill:  0    Order Specific Question:  Supervising Provider    Answer:  Chipper Herb [[id number]]  . predniSONE (DELTASONE) 20 MG tablet    Sig: 2 po at sametime daily for 5 days- start tomorrow    Dispense:  10 tablet    Refill:  0    Order Specific Question:  Supervising Provider    Answer:  Chipper Herb [[id number]]  . methylPREDNISolone acetate (DEPO-MEDROL) injection 80 mg    Sig:    1. Take meds as prescribed 2. Use a cool mist humidifier especially during the winter months and when heat has been humid. 3. Use saline nose sprays frequently 4. Saline irrigations of  the nose can be very helpful if done frequently.  * 4X daily for 1 week*  * Use of a nettie pot can be helpful with this. Follow directions with this* 5. Drink plenty of fluids 6. Keep thermostat turn down low 7.For any cough or congestion  Use plain Mucinex- regular strength or max strength is fine   * Children- consult with Pharmacist for dosing 8. For fever or aces or pains- take tylenol or ibuprofen appropriate for age and weight.  * for fevers greater than 101 orally you may alternate ibuprofen and tylenol every  3 hours.   Mary-Margaret Hassell Done, FNP

## 2013-09-21 NOTE — Patient Instructions (Signed)

## 2013-10-12 ENCOUNTER — Telehealth: Payer: Self-pay | Admitting: Nurse Practitioner

## 2013-10-12 ENCOUNTER — Ambulatory Visit (INDEPENDENT_AMBULATORY_CARE_PROVIDER_SITE_OTHER): Payer: Medicare Other

## 2013-10-12 ENCOUNTER — Encounter: Payer: Self-pay | Admitting: Nurse Practitioner

## 2013-10-12 ENCOUNTER — Other Ambulatory Visit: Payer: Self-pay | Admitting: Nurse Practitioner

## 2013-10-12 ENCOUNTER — Ambulatory Visit (INDEPENDENT_AMBULATORY_CARE_PROVIDER_SITE_OTHER): Payer: Medicare Other | Admitting: Nurse Practitioner

## 2013-10-12 VITALS — BP 140/87 | HR 86 | Temp 97.0°F | Ht 64.0 in | Wt 215.0 lb

## 2013-10-12 DIAGNOSIS — T1490XA Injury, unspecified, initial encounter: Secondary | ICD-10-CM

## 2013-10-12 DIAGNOSIS — S20219A Contusion of unspecified front wall of thorax, initial encounter: Secondary | ICD-10-CM

## 2013-10-12 NOTE — Telephone Encounter (Signed)
appt today with mmm

## 2013-10-12 NOTE — Progress Notes (Signed)
   Subjective:    Patient ID: [name], female    DOB: 08-May-1934, 80 y.o.   MRN: [id number]  HPI Patient fell backwards at home Saturday afternoon- When her husband went to pick her up he wrapped his arms around her and she felt her sternum pop- Any movement hurts- Coughing and burping is really bad.    Review of Systems  Constitutional: Negative.   HENT: Negative.   Respiratory: Positive for shortness of breath.   Cardiovascular: Positive for chest pain.  Genitourinary: Negative.   Neurological: Negative.   Psychiatric/Behavioral: Negative.   All other systems reviewed and are negative.       Objective:   Physical Exam  Constitutional: She appears well-developed and well-nourished.  Cardiovascular: Normal rate, regular rhythm and normal heart sounds.   Pulmonary/Chest: Effort normal and breath sounds normal.  Skin: Skin is warm.  no contusion ant chest wall   BP 140/87  Pulse 86  Temp(Src) 97 F (36.1 C) (Oral)  Ht 5\' 4"  (1.626 m)  Wt 215 lb (97.523 kg)  BMI 36.89 kg/m2  Chest x ray- no sternum fracture noted-Preliminary reading by Ronnald Collum, FNP  [facility]       Assessment & Plan:   1. Injury   2. Chest wall contusion    Tylenol OTC Cough an ddeep breathe every 2 hours to prevent pneumonia Splint chest wall when coughing RTO prn  Mary-Margaret Hassell Done, FNP

## 2013-10-23 DIAGNOSIS — S23429A Unspecified sprain of sternum, initial encounter: Secondary | ICD-10-CM | POA: Diagnosis not present

## 2013-10-26 ENCOUNTER — Other Ambulatory Visit: Payer: Self-pay | Admitting: Orthopedic Surgery

## 2013-10-26 DIAGNOSIS — S23429A Unspecified sprain of sternum, initial encounter: Secondary | ICD-10-CM

## 2013-10-27 ENCOUNTER — Ambulatory Visit
Admission: RE | Admit: 2013-10-27 | Discharge: 2013-10-27 | Disposition: A | Discharge status: Home / Self Care | Payer: Medicare Other | Source: Ambulatory Visit | Attending: Orthopedic Surgery | Admitting: Orthopedic Surgery

## 2013-10-27 DIAGNOSIS — S2220XA Unspecified fracture of sternum, initial encounter for closed fracture: Secondary | ICD-10-CM | POA: Diagnosis not present

## 2013-10-27 DIAGNOSIS — S23429A Unspecified sprain of sternum, initial encounter: Secondary | ICD-10-CM

## 2013-10-27 HISTORY — DX: Unspecified fracture of sternum, initial encounter for closed fracture: S22.20XA

## 2013-11-03 DIAGNOSIS — S23429A Unspecified sprain of sternum, initial encounter: Secondary | ICD-10-CM | POA: Diagnosis not present

## 2013-11-03 DIAGNOSIS — S2220XA Unspecified fracture of sternum, initial encounter for closed fracture: Secondary | ICD-10-CM | POA: Diagnosis not present

## 2013-11-25 ENCOUNTER — Telehealth: Payer: Self-pay | Admitting: Internal Medicine

## 2013-11-25 NOTE — Telephone Encounter (Signed)
Patient states that she has sand colored stools with occasional diarrhea.  She will keep the appt for 01/18/14 3:45.  She will call back for other GI complaints or worsening symptoms prior to her appt

## 2013-12-03 ENCOUNTER — Encounter: Payer: Self-pay | Admitting: Family

## 2013-12-03 ENCOUNTER — Ambulatory Visit (INDEPENDENT_AMBULATORY_CARE_PROVIDER_SITE_OTHER): Payer: Medicare Other | Admitting: Family

## 2013-12-03 VITALS — BP 135/75 | HR 92 | Temp 98.5°F | Ht 64.0 in | Wt 218.4 lb

## 2013-12-03 DIAGNOSIS — N390 Urinary tract infection, site not specified: Secondary | ICD-10-CM

## 2013-12-03 DIAGNOSIS — R300 Dysuria: Secondary | ICD-10-CM

## 2013-12-03 LAB — POCT UA - MICROSCOPIC ONLY
CASTS, UR, LPF, POC: NEGATIVE
CRYSTALS, UR, HPF, POC: NEGATIVE
MUCUS UA: NEGATIVE
Yeast, UA: NEGATIVE

## 2013-12-03 LAB — POCT URINALYSIS DIPSTICK

## 2013-12-03 MED ORDER — NITROFURANTOIN MONOHYD MACRO 100 MG PO CAPS: 100.0000 mg | ORAL_CAPSULE | Freq: Two times a day (BID) | ORAL | Status: DC

## 2013-12-03 NOTE — Progress Notes (Signed)
   Subjective:    Patient ID: [name], female    DOB: 1934/05/11, 80 y.o.   MRN: [id number]  Dysuria  This is a new problem. The current episode started yesterday. The problem occurs intermittently. The problem has been gradually worsening. The quality of the pain is described as burning. The pain is at a severity of 10/10. The pain is moderate. There has been no fever. Associated symptoms include chills, frequency, hesitancy and urgency. Pertinent negatives include no flank pain, hematuria, nausea or vomiting. Treatments tried: AZO. The treatment provided mild relief.      Review of Systems  Constitutional: Positive for chills.  Gastrointestinal: Negative for nausea and vomiting.  Genitourinary: Positive for dysuria, hesitancy, urgency and frequency. Negative for hematuria and flank pain.  All other systems reviewed and are negative.      Objective:   Physical Exam  Vitals reviewed. Constitutional: She is oriented to person, place, and time. She appears well-developed and well-nourished. No distress.  Cardiovascular: Normal rate, regular rhythm, normal heart sounds and intact distal pulses.   No murmur heard. Pulmonary/Chest: Effort normal and breath sounds normal. No respiratory distress. She has no wheezes.  Abdominal: Soft. Bowel sounds are normal. She exhibits no distension. There is no tenderness.  Musculoskeletal: Normal range of motion. She exhibits no edema and no tenderness.  Neurological: She is alert and oriented to person, place, and time. She has normal reflexes. No cranial nerve deficit.  Skin: Skin is warm and dry.  Psychiatric: She has a normal mood and affect. Her behavior is normal. Judgment and thought content normal.    BP 135/75  Pulse 92  Temp(Src) 98.5 F (36.9 C) (Oral)  Ht 5\' 4"  (1.626 m)  Wt 218 lb 6.4 oz (99.066 kg)  BMI 37.47 kg/m2       Assessment & Plan:  1. Dysuria  - POCT UA - Microscopic Only - POCT urinalysis dipstick  2. UTI  (urinary tract infection) Force fluids AZO over the counter X2 days RTO prn Meds ordered this encounter  Medications  . nitrofurantoin, macrocrystal-monohydrate, (MACROBID) 100 MG capsule    Sig: Take 1 capsule (100 mg total) by mouth 2 (two) times daily.    Dispense:  10 capsule    Refill:  0    Order Specific Question:  Supervising Provider    Answer:  Chipper Herb [[id number]]     Evelina Dun, FNP

## 2013-12-03 NOTE — Patient Instructions (Addendum)
Urinary Tract Infection Urinary tract infections (UTIs) can develop anywhere along your urinary tract. Your urinary tract is your body's drainage system for removing wastes and extra water. Your urinary tract includes two kidneys, two ureters, a bladder, and a urethra. Your kidneys are a pair of bean-shaped organs. Each kidney is about the size of your fist. They are located below your ribs, one on each side of your spine. CAUSES Infections are caused by microbes, which are microscopic organisms, including fungi, viruses, and bacteria. These organisms are so small that they can only be seen through a microscope. Bacteria are the microbes that most commonly cause UTIs. SYMPTOMS  Symptoms of UTIs may vary by age and gender of the patient and by the location of the infection. Symptoms in young women typically include a frequent and intense urge to urinate and a painful, burning feeling in the bladder or urethra during urination. Older women and men are more likely to be tired, shaky, and weak and have muscle aches and abdominal pain. A fever may mean the infection is in your kidneys. Other symptoms of a kidney infection include pain in your back or sides below the ribs, nausea, and vomiting. DIAGNOSIS To diagnose a UTI, your caregiver will ask you about your symptoms. Your caregiver also will ask to provide a urine sample. The urine sample will be tested for bacteria and white blood cells. White blood cells are made by your body to help fight infection. TREATMENT  Typically, UTIs can be treated with medication. Because most UTIs are caused by a bacterial infection, they usually can be treated with the use of antibiotics. The choice of antibiotic and length of treatment depend on your symptoms and the type of bacteria causing your infection. HOME CARE INSTRUCTIONS  If you were prescribed antibiotics, take them exactly as your caregiver instructs you. Finish the medication even if you feel better after you  have only taken some of the medication.  Drink enough water and fluids to keep your urine clear or pale yellow.  Avoid caffeine, tea, and carbonated beverages. They tend to irritate your bladder.  Empty your bladder often. Avoid holding urine for long periods of time.  Empty your bladder before and after sexual intercourse.  After a bowel movement, women should cleanse from front to back. Use each tissue only once. SEEK MEDICAL CARE IF:   You have back pain.  You develop a fever.  Your symptoms do not begin to resolve within 3 days. SEEK IMMEDIATE MEDICAL CARE IF:   You have severe back pain or lower abdominal pain.  You develop chills.  You have nausea or vomiting.  You have continued burning or discomfort with urination. MAKE SURE YOU:   Understand these instructions.  Will watch your condition.  Will get help right away if you are not doing well or get worse. Document Released: 04/25/2005 Document Revised: 01/15/2012 Document Reviewed: 08/24/2011 [facility] Patient Information 2014 [place].  Force fluids AZO over the counter X2 days RTO prn

## 2013-12-04 ENCOUNTER — Other Ambulatory Visit: Payer: Self-pay | Admitting: Family Medicine

## 2013-12-07 NOTE — Telephone Encounter (Signed)
Last seen 12/03/13 [name]  Last Vit D 09/02/13 26.0 low

## 2013-12-22 DIAGNOSIS — S2220XA Unspecified fracture of sternum, initial encounter for closed fracture: Secondary | ICD-10-CM | POA: Diagnosis not present

## 2013-12-24 ENCOUNTER — Other Ambulatory Visit: Payer: Self-pay | Admitting: Orthopedic Surgery

## 2013-12-24 DIAGNOSIS — S2220XA Unspecified fracture of sternum, initial encounter for closed fracture: Secondary | ICD-10-CM

## 2013-12-30 ENCOUNTER — Ambulatory Visit
Admission: RE | Admit: 2013-12-30 | Discharge: 2013-12-30 | Disposition: A | Discharge status: Home / Self Care | Payer: Medicare Other | Source: Ambulatory Visit | Attending: Orthopedic Surgery | Admitting: Orthopedic Surgery

## 2013-12-30 DIAGNOSIS — S2220XA Unspecified fracture of sternum, initial encounter for closed fracture: Secondary | ICD-10-CM

## 2013-12-30 DIAGNOSIS — Z4789 Encounter for other orthopedic aftercare: Secondary | ICD-10-CM | POA: Diagnosis not present

## 2014-01-18 ENCOUNTER — Ambulatory Visit: Payer: Medicare Other | Admitting: Internal Medicine

## 2014-01-21 DIAGNOSIS — T1490XA Injury, unspecified, initial encounter: Secondary | ICD-10-CM | POA: Diagnosis not present

## 2014-01-21 DIAGNOSIS — R6889 Other general symptoms and signs: Secondary | ICD-10-CM | POA: Diagnosis not present

## 2014-02-12 ENCOUNTER — Encounter: Payer: Self-pay | Admitting: Gastroenterology

## 2014-03-03 ENCOUNTER — Ambulatory Visit (INDEPENDENT_AMBULATORY_CARE_PROVIDER_SITE_OTHER): Payer: Medicare Other | Admitting: Internal Medicine

## 2014-03-03 ENCOUNTER — Other Ambulatory Visit (INDEPENDENT_AMBULATORY_CARE_PROVIDER_SITE_OTHER): Payer: Medicare Other

## 2014-03-03 ENCOUNTER — Encounter: Payer: Self-pay | Admitting: Internal Medicine

## 2014-03-03 VITALS — BP 128/70 | HR 78

## 2014-03-03 DIAGNOSIS — R195 Other fecal abnormalities: Secondary | ICD-10-CM

## 2014-03-03 DIAGNOSIS — R1013 Epigastric pain: Secondary | ICD-10-CM

## 2014-03-03 DIAGNOSIS — R141 Gas pain: Secondary | ICD-10-CM

## 2014-03-03 DIAGNOSIS — IMO0001 Reserved for inherently not codable concepts without codable children: Secondary | ICD-10-CM

## 2014-03-03 DIAGNOSIS — G8929 Other chronic pain: Secondary | ICD-10-CM

## 2014-03-03 DIAGNOSIS — R143 Flatulence: Secondary | ICD-10-CM

## 2014-03-03 DIAGNOSIS — R142 Eructation: Secondary | ICD-10-CM

## 2014-03-03 LAB — HEPATIC FUNCTION PANEL
ALT: 16 U/L (ref 0–35)
AST: 21 U/L (ref 0–37)
Albumin: 4 g/dL (ref 3.5–5.2)
Alkaline Phosphatase: 62 U/L (ref 39–117)
BILIRUBIN DIRECT: 0.1 mg/dL (ref 0.0–0.3)
Total Bilirubin: 0.6 mg/dL (ref 0.2–1.2)
Total Protein: 7.2 g/dL (ref 6.0–8.3)

## 2014-03-03 LAB — AMYLASE: Amylase: 47 U/L (ref 27–131)

## 2014-03-03 LAB — LIPASE: LIPASE: 24 U/L (ref 11.0–59.0)

## 2014-03-03 NOTE — Patient Instructions (Signed)
Your physician has requested that you go to the basement for the following lab work before leaving today: Stool studies, liver function test, amylase, lipase   I appreciate the opportunity to care for you.

## 2014-03-05 ENCOUNTER — Other Ambulatory Visit: Payer: Medicare Other

## 2014-03-05 DIAGNOSIS — G8929 Other chronic pain: Secondary | ICD-10-CM

## 2014-03-05 DIAGNOSIS — R1013 Epigastric pain: Secondary | ICD-10-CM | POA: Diagnosis not present

## 2014-03-05 DIAGNOSIS — R195 Other fecal abnormalities: Secondary | ICD-10-CM | POA: Diagnosis not present

## 2014-03-05 NOTE — Progress Notes (Signed)
   Subjective:    Patient ID: [name], female    DOB: 1933-12-14, 80 y.o.   MRN: [id number]  HPI Here with husband concerned about tan stools and crampy lower abdominal pain. She is concerned that this could represent something bad. No bleeding. Medications, allergies, past medical history, past surgical history, family history and social history are reviewed and updated in the EMR.   Review of Systems As above, + anxiety, + back pain and reduced mobility    Objective:   Physical Exam Obese, NAD abd soft and nontender, BS+ Lungs cta Cor S1S2    Assessment & Plan:  Change in stool - Plan: Hepatic function panel, Amylase, Lipase, Fecal fat, qualtitative, Pancreatic Elastase, Fecal  Abdominal pain, chronic, epigastric - Plan: Hepatic function panel, Amylase, Lipase, Fecal fat, qualtitative, Pancreatic Elastase, Fecal  Gas  Needs evaluation for pancreatic insufficiency Check other labs Consider pancreatic enzyme supplements ? Xifaxan

## 2014-03-08 LAB — FECAL FAT QUALITATIVE
FREE FATTY ACIDS: NORMAL
NEUTRAL FAT: NORMAL

## 2014-03-12 ENCOUNTER — Other Ambulatory Visit: Payer: Self-pay

## 2014-03-12 ENCOUNTER — Telehealth: Payer: Self-pay

## 2014-03-12 LAB — PANCREATIC ELASTASE, FECAL: Pancreatic Elastase-1, Stool: >500 mcg/g

## 2014-03-12 MED ORDER — RIFAXIMIN 550 MG PO TABS: 550.0000 mg | ORAL_TABLET | Freq: Two times a day (BID) | ORAL | Status: DC

## 2014-03-12 NOTE — Telephone Encounter (Signed)
Faxed paperwork to [facility] fax# [phone] for them to do the prior authorization on xifaxin 550mg  for IBS, ICD-9 code  564.1.  Recent office note and labs sent.  We will await to hear from them.

## 2014-03-12 NOTE — Progress Notes (Signed)
Quick Note:  Tests we did tell us her pancreas is working normally.  I want her to take a course of Xifaxan 550 mg tid x 14 days for IBS  May need to do bid # 60 and tell her to take it tid to keep cost down ______

## 2014-03-15 NOTE — Telephone Encounter (Signed)
CALLED [facility] TO CHECK ON RX FOR XIFAXAN. I SPOKE WITH MANDY AT [facility]. PER MANDY A PRIOR AUTH IS NEEDED AND THEY HAVE TO WORK ON THAT, RX WAS SENT 03-12-14. PER MANDY WILL CALL WITH AN UPDATE ONCE THEY HEAR FROM INSURANCE COMPANY.  LMOM FOR PATIENT TO CALL OFFICE BACK

## 2014-03-15 NOTE — Telephone Encounter (Signed)
PATIENT AND HER HUSBAND CALLED BACK--WAS ON SPEAKER PHONE THEY HAD QUESTIONS REGARDING XIFAXIN AND WHEN WILL THEY KNOW ABOUT THE RX QUESTIONS ABOUT XIFAXAN WAS ANSWERED, PATIENT AND HUSBAND SATISFIED  I ADVISED RX WAS SENT WE ARE WAITING TO HEAR BACK FROM THE INSURANCE COMPANY BUT AS SOON AS WE HEAR BACK [facility] WILL CALL, PLUS OUR OFFICE WILL CONTACT THEM THEY VERBALIZED UNDERSTANDING

## 2014-03-17 NOTE — Telephone Encounter (Signed)
I SPOKE WITH JOSH AT [facility]. PER JOSH PRIOR APPROVAL IS STILL PENDING. JOSH WILL FOLLOW UP WITH [employer] TODAY AND CALL us BACK.

## 2014-03-17 NOTE — Telephone Encounter (Signed)
SPOKE TO [name] AT [facility] AND HE REPORTS THAT [employer] HAS DENIED THE XIFAXAN.  HE IS GOING TO TRY AND GET IT FREE THRU THE [employer].  HE WILL BE BACK IN TOUCH WITH THE OUTCOME OF THIS REQUEST.

## 2014-03-19 ENCOUNTER — Encounter: Payer: Self-pay | Admitting: Family

## 2014-03-19 ENCOUNTER — Ambulatory Visit (INDEPENDENT_AMBULATORY_CARE_PROVIDER_SITE_OTHER): Payer: Medicare Other | Admitting: Family

## 2014-03-19 VITALS — BP 145/87 | HR 89 | Temp 96.8°F | Ht 64.0 in | Wt 224.0 lb

## 2014-03-19 DIAGNOSIS — N390 Urinary tract infection, site not specified: Secondary | ICD-10-CM

## 2014-03-19 DIAGNOSIS — R300 Dysuria: Secondary | ICD-10-CM | POA: Diagnosis not present

## 2014-03-19 LAB — POCT URINALYSIS DIPSTICK
BILIRUBIN UA: NEGATIVE
Glucose, UA: NEGATIVE
Ketones, UA: NEGATIVE
NITRITE UA: NEGATIVE
PH UA: 6.5
PROTEIN UA: NEGATIVE
Spec Grav, UA: 1.01
Urobilinogen, UA: NEGATIVE

## 2014-03-19 LAB — POCT UA - MICROSCOPIC ONLY
Bacteria, U Microscopic: NEGATIVE
CASTS, UR, LPF, POC: NEGATIVE
Crystals, Ur, HPF, POC: NEGATIVE
Mucus, UA: NEGATIVE
YEAST UA: NEGATIVE

## 2014-03-19 MED ORDER — CIPROFLOXACIN HCL 500 MG PO TABS: 500.0000 mg | ORAL_TABLET | Freq: Two times a day (BID) | ORAL | Status: DC

## 2014-03-19 NOTE — Patient Instructions (Signed)

## 2014-03-19 NOTE — Progress Notes (Signed)
Subjective:    Patient ID: [name], female    DOB: 1933/08/27, 80 y.o.   MRN: [id number]  Dysuria  This is a new problem. The current episode started in the past 7 days. The problem occurs every urination. The problem has been gradually worsening. The quality of the pain is described as burning and aching. The pain is at a severity of 8/10. The pain is moderate. There has been no fever. Associated symptoms include frequency, hesitancy and urgency. Pertinent negatives include no discharge, flank pain, hematuria, nausea or vomiting. Treatments tried: AZO. The treatment provided mild relief.      Review of Systems  Constitutional: Negative.   HENT: Negative.   Eyes: Negative.   Respiratory: Negative.  Negative for shortness of breath.   Cardiovascular: Negative.  Negative for palpitations.  Gastrointestinal: Negative.  Negative for nausea and vomiting.  Endocrine: Negative.   Genitourinary: Positive for dysuria, hesitancy, urgency and frequency. Negative for hematuria and flank pain.  Musculoskeletal: Negative.   Neurological: Negative.  Negative for headaches.  Hematological: Negative.   Psychiatric/Behavioral: Negative.   All other systems reviewed and are negative.      Objective:   Physical Exam  Vitals reviewed. Constitutional: She is oriented to person, place, and time. She appears well-developed and well-nourished. No distress.  HENT:  Head: Normocephalic and atraumatic.  Right Ear: External ear normal.  Left Ear: External ear normal.  Mouth/Throat: Oropharynx is clear and moist.  Eyes: Pupils are equal, round, and reactive to light.  Neck: Normal range of motion. Neck supple. No thyromegaly present.  Cardiovascular: Normal rate, regular rhythm, normal heart sounds and intact distal pulses.   No murmur heard. Pulmonary/Chest: Effort normal and breath sounds normal. No respiratory distress. She has no wheezes.  Abdominal: Soft. Bowel sounds are normal. She exhibits  no distension. There is no tenderness.  Musculoskeletal: Normal range of motion. She exhibits no edema and no tenderness.  Negative for CVA tenderness   Neurological: She is alert and oriented to person, place, and time. She has normal reflexes. No cranial nerve deficit.  Skin: Skin is warm and dry.  Psychiatric: She has a normal mood and affect. Her behavior is normal. Judgment and thought content normal.    Results for orders placed in visit on 03/19/14  POCT URINALYSIS DIPSTICK      Result Value Ref Range   Color, UA yellow     Clarity, UA clear     Glucose, UA neg     Bilirubin, UA neg     Ketones, UA neg     Spec Grav, UA 1.010     Blood, UA large     pH, UA 6.5     Protein, UA neg     Urobilinogen, UA negative     Nitrite, UA neg     Leukocytes, UA large (3+)    POCT UA - MICROSCOPIC ONLY      Result Value Ref Range   WBC, Ur, HPF, POC 75-100 clumps present     RBC, urine, microscopic 10-15     Bacteria, U Microscopic neg     Mucus, UA neg     Epithelial cells, urine per micros mod     Crystals, Ur, HPF, POC neg     Casts, Ur, LPF, POC neg     Yeast, UA neg       BP 145/87  Pulse 89  Temp(Src) 96.8 F (36 C) (Oral)  Ht 5\' 4"  (1.626 m)  Wt 224 lb (101.606 kg)  BMI 38.43 kg/m2     Assessment & Plan:  1. Dysuria - POCT urinalysis dipstick - POCT UA - Microscopic Only  2. Urinary tract infection, site not specified -Force fluids AZO over the counter X2 days RTO prn Culture pending - ciprofloxacin (CIPRO) 500 MG tablet; Take 1 tablet (500 mg total) by mouth 2 (two) times daily.  Dispense: 10 tablet; Refill: 0 - Urine culture  Evelina Dun, FNP

## 2014-03-21 LAB — URINE CULTURE

## 2014-03-23 NOTE — Telephone Encounter (Signed)
SPOKE WITH JACKIE AT [facility], SHE SAID THEY MAILED FORMS TO THE PATIENT 03/19/14 TO FILL OUT TO TRY AND GET IT FREE.  THEY WILL CONTINUE TO UPDATE [place].

## 2014-03-24 ENCOUNTER — Other Ambulatory Visit: Payer: Self-pay | Admitting: Family

## 2014-03-26 ENCOUNTER — Telehealth: Payer: Self-pay | Admitting: Internal Medicine

## 2014-03-26 NOTE — Telephone Encounter (Signed)
Last seen 03/19/14  [name]  Last Vit D 09/02/13  26.0 Paul

## 2014-03-26 NOTE — Telephone Encounter (Signed)
SPOKE WITH [facility] AND THEY ARE JUST WAITING ON [name]'S FREE ASSISTANCE FORM TO PROCEED.  I CALLED [employer] AND SHE SAID THAT THEY MAKE TOO MUCH MONEY TO  GET IT FREE.  SHE WOULD LIKE TO KNOW WHAT ELSE WE CAN DO SIR.

## 2014-03-26 NOTE — Telephone Encounter (Signed)
Prescribe 550 mg twice a day #60 no refill and do not associate with a diagnosis and tell her to take it 3 times a day for 14 days

## 2014-03-31 MED ORDER — METRONIDAZOLE 250 MG PO TABS: 250.0000 mg | ORAL_TABLET | Freq: Three times a day (TID) | ORAL | Status: DC

## 2014-03-31 NOTE — Telephone Encounter (Signed)
Patient informed that we are sending in generic flagyl since the xifaxin is too expensive.  Confirmed pharmacy.

## 2014-03-31 NOTE — Telephone Encounter (Signed)
Dr. Carlean Purl said to send in generic flagyl instead of the xifaxan due to the high cost.  I informed [facility] that we were doing this so they could close out this claim.

## 2014-03-31 NOTE — Telephone Encounter (Signed)
Verbally spoke with Dr Carlean Purl in clinic this AM about this patient.  Will send in generic flagyl today since cost is too high on the xifaxan.

## 2014-03-31 NOTE — Telephone Encounter (Signed)
Try metronidazole 250 mg tid x 10 days instead

## 2014-04-14 ENCOUNTER — Telehealth: Payer: Self-pay | Admitting: Family

## 2014-04-14 MED ORDER — SULFAMETHOXAZOLE-TMP DS 800-160 MG PO TABS: 1.0000 | ORAL_TABLET | Freq: Two times a day (BID) | ORAL | Status: DC

## 2014-04-14 NOTE — Telephone Encounter (Signed)
Antibiotic sent to pharmacy for UTI

## 2014-05-03 ENCOUNTER — Ambulatory Visit (INDEPENDENT_AMBULATORY_CARE_PROVIDER_SITE_OTHER): Payer: Medicare Other

## 2014-05-03 DIAGNOSIS — Z23 Encounter for immunization: Secondary | ICD-10-CM

## 2014-05-07 ENCOUNTER — Other Ambulatory Visit: Payer: Self-pay

## 2014-05-07 ENCOUNTER — Encounter (HOSPITAL_COMMUNITY): Payer: Self-pay | Admitting: Emergency Medicine

## 2014-05-07 ENCOUNTER — Emergency Department (HOSPITAL_COMMUNITY): Payer: Medicare Other

## 2014-05-07 ENCOUNTER — Observation Stay (HOSPITAL_COMMUNITY)
Admission: EM | Admit: 2014-05-07 | Discharge: 2014-05-08 | Disposition: A | Discharge status: Home / Self Care | Payer: Medicare Other | Attending: Internal Medicine | Admitting: Internal Medicine

## 2014-05-07 DIAGNOSIS — Z87891 Personal history of nicotine dependence: Secondary | ICD-10-CM | POA: Diagnosis not present

## 2014-05-07 DIAGNOSIS — M1990 Unspecified osteoarthritis, unspecified site: Secondary | ICD-10-CM | POA: Insufficient documentation

## 2014-05-07 DIAGNOSIS — E079 Disorder of thyroid, unspecified: Secondary | ICD-10-CM | POA: Diagnosis not present

## 2014-05-07 DIAGNOSIS — K589 Irritable bowel syndrome without diarrhea: Secondary | ICD-10-CM | POA: Diagnosis not present

## 2014-05-07 DIAGNOSIS — E039 Hypothyroidism, unspecified: Secondary | ICD-10-CM | POA: Diagnosis not present

## 2014-05-07 DIAGNOSIS — Z87440 Personal history of urinary (tract) infections: Secondary | ICD-10-CM | POA: Insufficient documentation

## 2014-05-07 DIAGNOSIS — R011 Cardiac murmur, unspecified: Secondary | ICD-10-CM | POA: Insufficient documentation

## 2014-05-07 DIAGNOSIS — E038 Other specified hypothyroidism: Secondary | ICD-10-CM

## 2014-05-07 DIAGNOSIS — K317 Polyp of stomach and duodenum: Secondary | ICD-10-CM | POA: Diagnosis not present

## 2014-05-07 DIAGNOSIS — R002 Palpitations: Secondary | ICD-10-CM | POA: Diagnosis not present

## 2014-05-07 DIAGNOSIS — M25519 Pain in unspecified shoulder: Secondary | ICD-10-CM | POA: Diagnosis not present

## 2014-05-07 DIAGNOSIS — M542 Cervicalgia: Secondary | ICD-10-CM | POA: Diagnosis not present

## 2014-05-07 DIAGNOSIS — I318 Other specified diseases of pericardium: Secondary | ICD-10-CM | POA: Diagnosis not present

## 2014-05-07 DIAGNOSIS — E785 Hyperlipidemia, unspecified: Secondary | ICD-10-CM | POA: Diagnosis not present

## 2014-05-07 DIAGNOSIS — G319 Degenerative disease of nervous system, unspecified: Secondary | ICD-10-CM | POA: Insufficient documentation

## 2014-05-07 DIAGNOSIS — R0789 Other chest pain: Secondary | ICD-10-CM

## 2014-05-07 DIAGNOSIS — I10 Essential (primary) hypertension: Secondary | ICD-10-CM | POA: Diagnosis not present

## 2014-05-07 DIAGNOSIS — R079 Chest pain, unspecified: Principal | ICD-10-CM | POA: Insufficient documentation

## 2014-05-07 DIAGNOSIS — Z8673 Personal history of transient ischemic attack (TIA), and cerebral infarction without residual deficits: Secondary | ICD-10-CM | POA: Insufficient documentation

## 2014-05-07 DIAGNOSIS — F419 Anxiety disorder, unspecified: Secondary | ICD-10-CM | POA: Diagnosis not present

## 2014-05-07 DIAGNOSIS — I7102 Dissection of abdominal aorta: Secondary | ICD-10-CM

## 2014-05-07 DIAGNOSIS — K429 Umbilical hernia without obstruction or gangrene: Secondary | ICD-10-CM | POA: Diagnosis not present

## 2014-05-07 DIAGNOSIS — G40909 Epilepsy, unspecified, not intractable, without status epilepticus: Secondary | ICD-10-CM | POA: Insufficient documentation

## 2014-05-07 DIAGNOSIS — F329 Major depressive disorder, single episode, unspecified: Secondary | ICD-10-CM | POA: Diagnosis not present

## 2014-05-07 DIAGNOSIS — R072 Precordial pain: Secondary | ICD-10-CM | POA: Diagnosis not present

## 2014-05-07 DIAGNOSIS — N281 Cyst of kidney, acquired: Secondary | ICD-10-CM | POA: Diagnosis not present

## 2014-05-07 DIAGNOSIS — Z79899 Other long term (current) drug therapy: Secondary | ICD-10-CM | POA: Insufficient documentation

## 2014-05-07 DIAGNOSIS — M549 Dorsalgia, unspecified: Secondary | ICD-10-CM | POA: Diagnosis not present

## 2014-05-07 DIAGNOSIS — K219 Gastro-esophageal reflux disease without esophagitis: Secondary | ICD-10-CM | POA: Insufficient documentation

## 2014-05-07 DIAGNOSIS — E041 Nontoxic single thyroid nodule: Secondary | ICD-10-CM | POA: Diagnosis present

## 2014-05-07 DIAGNOSIS — G459 Transient cerebral ischemic attack, unspecified: Secondary | ICD-10-CM

## 2014-05-07 DIAGNOSIS — H269 Unspecified cataract: Secondary | ICD-10-CM | POA: Diagnosis not present

## 2014-05-07 DIAGNOSIS — R030 Elevated blood-pressure reading, without diagnosis of hypertension: Secondary | ICD-10-CM | POA: Diagnosis not present

## 2014-05-07 DIAGNOSIS — K146 Glossodynia: Secondary | ICD-10-CM | POA: Diagnosis not present

## 2014-05-07 DIAGNOSIS — F32A Depression, unspecified: Secondary | ICD-10-CM | POA: Insufficient documentation

## 2014-05-07 DIAGNOSIS — I2510 Atherosclerotic heart disease of native coronary artery without angina pectoris: Secondary | ICD-10-CM | POA: Diagnosis not present

## 2014-05-07 LAB — BASIC METABOLIC PANEL
ANION GAP: 12 (ref 5–15)
BUN: 12 mg/dL (ref 6–23)
CALCIUM: 9.7 mg/dL (ref 8.4–10.5)
CHLORIDE: 98 meq/L (ref 96–112)
CO2: 26 meq/L (ref 19–32)
Creatinine, Ser: 0.81 mg/dL (ref 0.50–1.10)
GFR calc Af Amer: 79 mL/min — ABNORMAL LOW (ref >90)
GFR calc non Af Amer: 68 mL/min — ABNORMAL LOW (ref >90)
GLUCOSE: 106 mg/dL — AB (ref 70–99)
Potassium: 4.7 mEq/L (ref 3.7–5.3)
Sodium: 136 mEq/L — ABNORMAL LOW (ref 137–147)

## 2014-05-07 LAB — CBC WITH DIFFERENTIAL/PLATELET
Basophils Absolute: 0 10*3/uL (ref 0.0–0.1)
Basophils Relative: 1 % (ref 0–1)
Eosinophils Absolute: 0.2 10*3/uL (ref 0.0–0.7)
Eosinophils Relative: 3 % (ref 0–5)
HCT: 38.5 % (ref 36.0–46.0)
Hemoglobin: 12.4 g/dL (ref 12.0–15.0)
LYMPHS ABS: 2.1 10*3/uL (ref 0.7–4.0)
Lymphocytes Relative: 34 % (ref 12–46)
MCH: 29.5 pg (ref 26.0–34.0)
MCHC: 32.2 g/dL (ref 30.0–36.0)
MCV: 91.7 fL (ref 78.0–100.0)
MONOS PCT: 11 % (ref 3–12)
Monocytes Absolute: 0.7 10*3/uL (ref 0.1–1.0)
NEUTROS ABS: 3.1 10*3/uL (ref 1.7–7.7)
NEUTROS PCT: 51 % (ref 43–77)
Platelets: 216 10*3/uL (ref 150–400)
RBC: 4.2 MIL/uL (ref 3.87–5.11)
RDW: 13.6 % (ref 11.5–15.5)
WBC: 6.1 10*3/uL (ref 4.0–10.5)

## 2014-05-07 LAB — I-STAT TROPONIN, ED: Troponin i, poc: 0 ng/mL (ref 0.00–0.08)

## 2014-05-07 MED ORDER — SODIUM CHLORIDE 0.9 % IV SOLN: INTRAVENOUS | Status: DC

## 2014-05-07 MED ORDER — IOHEXOL 350 MG/ML SOLN
100.0000 mL | Freq: Once | INTRAVENOUS | Status: AC | PRN
Start: 2014-05-07 — End: 2014-05-07
  Administered 2014-05-07: 100 mL via INTRAVENOUS

## 2014-05-07 NOTE — ED Provider Notes (Signed)
CSN: [id number]     Arrival date & time 05/07/14  1716 History   First MD Initiated Contact with Patient 05/07/14 1756     Chief Complaint  Patient presents with  . Palpitations   Patient is a 80 y.o. female presenting with chest pain. The history is provided by the patient.  Chest Pain Pain location:  Substernal area Pain quality: pressure   Pain radiates to the back: yes   Pain severity:  Moderate Onset quality:  Sudden Timing:  Constant Progression:  Partially resolved Chronicity:  New Context: at rest   Relieved by:  Nothing Worsened by:  Nothing tried Ineffective treatments:  None tried Associated symptoms: back pain and palpitations   Associated symptoms: no abdominal pain, no dizziness, no fever, no headache, no nausea, no shortness of breath and not vomiting   Risk factors: high cholesterol and hypertension    80 year old female with history of hypertension, hyperlipidemia presents with back pain radiating to the chest. She has never had pain like this before. There no aggravating or alleviating factors. Patient was at rest when symptoms occurred. Also reports neck pain. She denies extremity weakness, numbness or tingling. Patient also states that she feels like her heart is racing fast at times.  Patient took an aspirin and experienced moderate relief of symptoms.  Past Medical History  Diagnosis Date  . IBS (irritable bowel syndrome)   . Hypertension   . Hyperlipidemia   . GERD (gastroesophageal reflux disease)   . Depression   . Murmur, heart   . Chronic pericarditis   . CVA (cerebral infarction) 05/2003  . Seizure disorder   . TIA (transient ischemic attack)   . Anxiety   . Chronic urinary tract infection   . Cerebellar degeneration   . Allergic rhinitis     PT. DENIES  . Arthritis     NECK  . Cataract   . Thyroid disease   . Seizures   . Hypothyroidism   . Burning tongue syndrome 25 years  . Complication of anesthesia     low o2 sats, coded 30 years ago   . Stroke 5 years ago    Right side of face weak, slurred speach  . Gastric polyps   . Personal history of arterial venous malformation (AVM)     right side of face  . Sternum fx 10/27/2013   Past Surgical History  Procedure Laterality Date  . Knee arthroscopy Right 11/14/2006  . Tibial and fibular internal fixation Left   . Upper gastrointestinal endoscopy  2009, 2013  . Colonoscopy  2006, 2009  . Appendectomy  80 years old  . Cyst removed  35 years ago  . Total abdominal hysterectomy  80 years old  . Knee arthroscopy Bilateral 5 and 6 years ago  . Knee arthroscopy with lateral menisectomy  07/03/2012    Procedure: KNEE ARTHROSCOPY WITH LATERAL MENISECTOMY;  Surgeon: Magnus Sinning, MD;  Location: [facility];  Service: Orthopedics;  Laterality: Left;  with Partial Lateral Menisectomy and Medial Menisectomy. Shaving of medial and lateral femoral condyles. Shaving of patella. Removal of a loose body   Family History  Problem Relation Age of Onset  . Heart attack Father 51    fatal  . Coronary artery disease Brother   . Diabetes Brother    History  Substance Use Topics  . Smoking status: Former Smoker -- 0.25 packs/day for 10 years    Types: Cigarettes    Quit date: 07/31/1975  . Smokeless tobacco: Never  Used  . Alcohol Use: Yes     Comment: 2 glasses a year   OB History   Grav Para Term Preterm Abortions TAB SAB Ect Mult Living                 Review of Systems  Constitutional: Negative for fever.  HENT: Negative for rhinorrhea and sore throat.   Eyes: Negative for visual disturbance.  Respiratory: Negative for chest tightness and shortness of breath.   Cardiovascular: Positive for chest pain and palpitations.  Gastrointestinal: Negative for nausea, vomiting, abdominal pain and constipation.  Genitourinary: Negative for dysuria and hematuria.  Musculoskeletal: Positive for back pain and neck pain.  Skin: Negative for rash.  Neurological: Negative for dizziness and  headaches.  Psychiatric/Behavioral: Negative for confusion.  All other systems reviewed and are negative.  Allergies  Review of patient's allergies indicates no known allergies.  Home Medications   Prior to Admission medications   Medication Sig Start Date End Date Taking? Authorizing Provider  amitriptyline (ELAVIL) 50 MG tablet Take 1 tablet (50 mg total) by mouth at bedtime. 09/02/13  Yes Lysbeth Penner, FNP  escitalopram (LEXAPRO) 10 MG tablet Take 10 mg by mouth at bedtime.   Yes Historical Provider, MD  escitalopram (LEXAPRO) 20 MG tablet Take 1 tablet (20 mg total) by mouth at bedtime. 09/02/13  Yes Lysbeth Penner, FNP  gabapentin (NEURONTIN) 300 MG capsule Take 1 capsule (300 mg total) by mouth 3 (three) times daily. 09/02/13  Yes Lysbeth Penner, FNP  levothyroxine (SYNTHROID, LEVOTHROID) 50 MCG tablet Take 1 tablet (50 mcg total) by mouth daily before breakfast. 09/16/13  Yes Lysbeth Penner, FNP  lisinopril (PRINIVIL,ZESTRIL) 20 MG tablet Take 1 tablet (20 mg total) by mouth at bedtime. 09/02/13  Yes Lysbeth Penner, FNP  lovastatin (MEVACOR) 40 MG tablet Take 1 tablet (40 mg total) by mouth at bedtime. 09/02/13  Yes Lysbeth Penner, FNP  omeprazole (PRILOSEC) 40 MG capsule Take 1 capsule (40 mg total) by mouth daily. 09/02/13  Yes Lysbeth Penner, FNP  vitamin B-12 (CYANOCOBALAMIN) 1000 MCG tablet Take 1,000 mcg by mouth daily.   Yes Historical Provider, MD  Vitamin D, Ergocalciferol, (DRISDOL) 50000 UNITS CAPS capsule Take 50,000 Units by mouth every Wednesday.   Yes Historical Provider, MD   BP 169/75  Pulse 77  Temp(Src) 97.5 F (36.4 C) (Oral)  Resp 18  SpO2 98% Physical Exam  Constitutional: She is oriented to person, place, and time. She appears well-developed and well-nourished. No distress.  Well appearing elderly white female  HENT:  Head: Normocephalic and atraumatic.  Mouth/Throat: Oropharynx is clear and moist.  Eyes: EOM are normal. Pupils are equal, round, and  reactive to light.  Neck: Neck supple. No JVD present.  Cardiovascular: Normal rate, regular rhythm, normal heart sounds and intact distal pulses.  Exam reveals no gallop.   No murmur heard. Pulmonary/Chest: Effort normal and breath sounds normal. She has no wheezes. She has no rales.  Abdominal: Soft. She exhibits no distension. There is no tenderness.  Musculoskeletal: Normal range of motion. She exhibits no tenderness.  Neurological: She is alert and oriented to person, place, and time. No cranial nerve deficit. She exhibits normal muscle tone.  Skin: Skin is warm and dry. No rash noted.  Psychiatric: Her behavior is normal.    ED Course  Procedures  None  Labs Review Labs Reviewed  BASIC METABOLIC PANEL - Abnormal; Notable for the following:    Sodium  136 (*)    Glucose, Bld 106 (*)    GFR calc non Af Amer 68 (*)    GFR calc Af Amer 79 (*)    All other components within normal limits  CBC WITH DIFFERENTIAL  Randolm Idol, ED    Imaging Review Dg Chest 2 View  05/07/2014   CLINICAL DATA:  Chest pain, palpitations  EXAM: CHEST  2 VIEW  COMPARISON:  10/12/2013  FINDINGS: Cardiomediastinal silhouette is stable. Atherosclerotic calcifications of thoracic aorta again noted. Osteopenia and mild degenerative changes thoracic spine. No acute infiltrate or pleural effusion. No pulmonary edema.  IMPRESSION: No active cardiopulmonary disease.   Electronically Signed   By: Lahoma Crocker M.D.   On: 05/07/2014 19:07   Ct Angio Chest Aortic Dissect W &/or W/o  05/07/2014   CLINICAL DATA:  Mid back pain starting last night. Nausea. History of sternal fracture 09/2013. Initial evaluation.  EXAM: CT ANGIOGRAPHY CHEST  CT ABDOMEN AND PELVIS WITH CONTRAST  TECHNIQUE: Multidetector CT imaging of the chest was performed using the standard protocol during bolus administration of intravenous contrast. Multiplanar CT image reconstructions and MIPs were obtained to evaluate the vascular anatomy.  Multidetector CT imaging of the abdomen and pelvis was performed using the standard protocol during bolus administration of intravenous contrast.  CONTRAST:  152mL OMNIPAQUE IOHEXOL 350 MG/ML SOLN  COMPARISON:  Chest x-ray 05/07/2014.  Chest CT 12/30/2013.  FINDINGS: CTA CHEST FINDINGS  Thoracic aorta normal caliber. Pulmonary arteries are normal. Heart size normal. Coronary artery disease.  No significant mediastinal or hilar adenopathy. Thoracic esophagus is unremarkable.  Large airways are patent. 8 mm nodule right middle lobe unchanged. Bibasilar atelectasis and/or mild infiltrates.  1.3 cm nodule left thyroid gland, nonemergent thyroid ultrasound could be obtained to further evaluate. Shotty axillary lymph nodes. Degenerative changes thoracic spine. No acute bony abnormality.  CT ABDOMEN and PELVIS FINDINGS  Liver normal. Spleen normal. Pancreas normal. No biliary distention. Gallbladder is nondistended.  Adrenals normal. Simple cyst right kidney. No hydronephrosis or obstructing ureteral stone. The bladder is nondistended. Hysterectomy. No pelvic mass. No free pelvic fluid collections.  No significant adenopathy. Aortoiliac atherosclerotic vascular disease. Visceral atherosclerotic vascular disease. The visceral vessels are patent. Portal vein patent.  Appendiceal region normal. No bowel distention. No free air. Stomach is nondistended. No mesenteric mass. Tiny umbilical hernia RUQ fat only.  Degenerative changes lumbar spine.  Thoracolumbar spine scoliosis.  Review of the MIP images confirms the above findings.  IMPRESSION: 1. No pulmonary embolus.  Coronary artery disease. 2. 1.3 cm left thyroid gland nodule. Nonemergent ultrasound can be obtained to further evaluate. 3. No acute intra-abdominal abnormality.   Electronically Signed   By: Marcello Moores  Register   On: 05/07/2014 21:05   Ct Angio Abd/pel W/ And/or W/o  05/07/2014   CLINICAL DATA:  Mid back pain starting last night. Nausea. History of sternal  fracture 09/2013. Initial evaluation.  EXAM: CT ANGIOGRAPHY CHEST  CT ABDOMEN AND PELVIS WITH CONTRAST  TECHNIQUE: Multidetector CT imaging of the chest was performed using the standard protocol during bolus administration of intravenous contrast. Multiplanar CT image reconstructions and MIPs were obtained to evaluate the vascular anatomy. Multidetector CT imaging of the abdomen and pelvis was performed using the standard protocol during bolus administration of intravenous contrast.  CONTRAST:  19mL OMNIPAQUE IOHEXOL 350 MG/ML SOLN  COMPARISON:  Chest x-ray 05/07/2014.  Chest CT 12/30/2013.  FINDINGS: CTA CHEST FINDINGS  Thoracic aorta normal caliber. Pulmonary arteries are normal. Heart size normal. Coronary  artery disease.  No significant mediastinal or hilar adenopathy. Thoracic esophagus is unremarkable.  Large airways are patent. 8 mm nodule right middle lobe unchanged. Bibasilar atelectasis and/or mild infiltrates.  1.3 cm nodule left thyroid gland, nonemergent thyroid ultrasound could be obtained to further evaluate. Shotty axillary lymph nodes. Degenerative changes thoracic spine. No acute bony abnormality.  CT ABDOMEN and PELVIS FINDINGS  Liver normal. Spleen normal. Pancreas normal. No biliary distention. Gallbladder is nondistended.  Adrenals normal. Simple cyst right kidney. No hydronephrosis or obstructing ureteral stone. The bladder is nondistended. Hysterectomy. No pelvic mass. No free pelvic fluid collections.  No significant adenopathy. Aortoiliac atherosclerotic vascular disease. Visceral atherosclerotic vascular disease. The visceral vessels are patent. Portal vein patent.  Appendiceal region normal. No bowel distention. No free air. Stomach is nondistended. No mesenteric mass. Tiny umbilical hernia RUQ fat only.  Degenerative changes lumbar spine.  Thoracolumbar spine scoliosis.  Review of the MIP images confirms the above findings.  IMPRESSION: 1. No pulmonary embolus.  Coronary artery disease.  2. 1.3 cm left thyroid gland nodule. Nonemergent ultrasound can be obtained to further evaluate. 3. No acute intra-abdominal abnormality.   Electronically Signed   By: Marcello Moores  Register   On: 05/07/2014 21:05   MDM   Final diagnoses:  Chest pain    80 year old Caucasian female with history of hypertension, hyperlipidemia, CVA who presents with chest and back pain. Patient is well appearing on exam. She is afebrile, hypertensive otherwise normal vitals. She is satting well on room air. She was reports neck pain. Symptoms are concerning for dissection. CTA of the chest is negative for dissection or other acute cardiac pulmonary process. Patient is negative. EKG demonstrates normal sinus rhythm, rate 71, no ST segment elevation or depression, T wave inversions in aVR and V1, QTC is 454. Labs are otherwise unremarkable. Patient will be admitted for ACS rule out. Stable for transfer to the floor.  Case discussed with Dr. Audie Pinto.   Gustavus Bryant, MD 05/07/14 2251

## 2014-05-07 NOTE — ED Notes (Signed)
Per EMS, pt comes from home with c/o heart palpitations, dull pain between shoulder blades, and neck pain in route. Pt A&OX4, NAD noted. Pt c/o chest pain last night, none today. Pt took 324mg  ASA at home. VSS: BP 148/76, P72, R22, 97% rm air. Pt had an irregular rhythm on 12 lead.

## 2014-05-07 NOTE — ED Notes (Signed)
Patient transported to CT 

## 2014-05-07 NOTE — ED Notes (Signed)
Dr. Doutova at bedside.  

## 2014-05-07 NOTE — H&P (Signed)
PCP: Redge Gainer, MD  Cardiology Dr. Sula Soda [facility]  Chief Complaint:    HPI: [name] is a 80 y.o. female   has a past medical history of IBS (irritable bowel syndrome); Hypertension; Hyperlipidemia; GERD (gastroesophageal reflux disease); Depression; Murmur, heart; Chronic pericarditis; CVA (cerebral infarction) (05/2003); Seizure disorder; TIA (transient ischemic attack); Anxiety; Chronic urinary tract infection; Cerebellar degeneration; Allergic rhinitis; Arthritis; Cataract; Thyroid disease; Seizures; Hypothyroidism; Burning tongue syndrome (25 years); Complication of anesthesia; Stroke (5 years ago); Gastric polyps; Personal history of arterial venous malformation (AVM); and Sternum fx (10/27/2013).   Presented with  Patient had sustained sternnal fracture after a fall in March 2015. Last nigh she was sitting in bed reading and started to have pain across her back that was throbbing she took two Aleve and resolved. She had some bad back pain radiating to her chest. Patient has chronic shortness of breath which is unchanged. She have had some mild nausea. She has had progressive dyspnea with exertion. Patient took aspirin and the chest pain has resolved. Currently chest pain free.  She states she has been having frequent falls.  Patient has family hx of Cerebella degeneration and is suspected to showing some signs. She have had PT eval in the past but states it does not help. In ER CTA of the chest negative for PE or dissection does show chronic nodule.  Patient states she has massive AVM in her Right face with occasional episodes of massive bleeding, she was told not to be on anticoagulation.     Hospitalist was called for admission for atypical Chest pain  Review of Systems:    Pertinent positives include: chest pain shortness of breath at rest.  dyspnea on exertion,  Constitutional:  No weight loss, night sweats, Fevers, chills,  fatigue, weight loss  HEENT:  No headaches, Difficulty swallowing,Tooth/dental problems,Sore throat,  No sneezing, itching, ear ache, nasal congestion, post nasal drip,  Cardio-vascular:  No , Orthopnea, PND, anasarca, dizziness, palpitations.no Bilateral lower extremity swelling  GI:  No heartburn, indigestion, abdominal pain, nausea, vomiting, diarrhea, change in bowel habits, loss of appetite, melena, blood in stool, hematemesis Resp:  no  No excess mucus, no productive cough, No non-productive cough, No coughing up of blood.No change in color of mucus.No wheezing. Skin:  no rash or lesions. No jaundice GU:  no dysuria, change in color of urine, no urgency or frequency. No straining to urinate.  No flank pain.  Musculoskeletal:  No joint pain or no joint swelling. No decreased range of motion. No back pain.  Psych:  No change in mood or affect. No depression or anxiety. No memory loss.  Neuro: no localizing neurological complaints, no tingling, no weakness, no double vision, no gait abnormality, no slurred speech, no confusion  Otherwise ROS are negative except for above, 10 systems were reviewed  Past Medical History: Past Medical History  Diagnosis Date  . IBS (irritable bowel syndrome)   . Hypertension   . Hyperlipidemia   . GERD (gastroesophageal reflux disease)   . Depression   . Murmur, heart   . Chronic pericarditis   . CVA (cerebral infarction) 05/2003  . Seizure disorder   . TIA (transient ischemic attack)   . Anxiety   . Chronic urinary tract infection   . Cerebellar degeneration   . Allergic rhinitis     PT. DENIES  . Arthritis     NECK  . Cataract   . Thyroid disease   .  Seizures   . Hypothyroidism   . Burning tongue syndrome 25 years  . Complication of anesthesia     low o2 sats, coded 30 years ago  . Stroke 5 years ago    Right side of face weak, slurred speach  . Gastric polyps   . Personal history of arterial venous malformation (AVM)     right  side of face  . Sternum fx 10/27/2013   Past Surgical History  Procedure Laterality Date  . Knee arthroscopy Right 11/14/2006  . Tibial and fibular internal fixation Left   . Upper gastrointestinal endoscopy  2009, 2013  . Colonoscopy  2006, 2009  . Appendectomy  80 years old  . Cyst removed  35 years ago  . Total abdominal hysterectomy  80 years old  . Knee arthroscopy Bilateral 5 and 6 years ago  . Knee arthroscopy with lateral menisectomy  07/03/2012    Procedure: KNEE ARTHROSCOPY WITH LATERAL MENISECTOMY;  Surgeon: Magnus Sinning, MD;  Location: [facility];  Service: Orthopedics;  Laterality: Left;  with Partial Lateral Menisectomy and Medial Menisectomy. Shaving of medial and lateral femoral condyles. Shaving of patella. Removal of a loose body     Medications: Prior to Admission medications   Medication Sig Start Date End Date Taking? Authorizing Provider  amitriptyline (ELAVIL) 50 MG tablet Take 25 mg by mouth at bedtime. 09/02/13  Yes Lysbeth Penner, FNP  escitalopram (LEXAPRO) 10 MG tablet Take 5 mg by mouth at bedtime.    Yes Historical Provider, MD  escitalopram (LEXAPRO) 20 MG tablet Take 1 tablet (20 mg total) by mouth at bedtime. 09/02/13  Yes Lysbeth Penner, FNP  gabapentin (NEURONTIN) 300 MG capsule Take 1 capsule (300 mg total) by mouth 3 (three) times daily. 09/02/13  Yes Lysbeth Penner, FNP  levothyroxine (SYNTHROID, LEVOTHROID) 50 MCG tablet Take 1 tablet (50 mcg total) by mouth daily before breakfast. 09/16/13  Yes Lysbeth Penner, FNP  lisinopril (PRINIVIL,ZESTRIL) 20 MG tablet Take 1 tablet (20 mg total) by mouth at bedtime. 09/02/13  Yes Lysbeth Penner, FNP  lovastatin (MEVACOR) 40 MG tablet Take 1 tablet (40 mg total) by mouth at bedtime. 09/02/13  Yes Lysbeth Penner, FNP  omeprazole (PRILOSEC) 40 MG capsule Take 1 capsule (40 mg total) by mouth daily. 09/02/13  Yes Lysbeth Penner, FNP  vitamin B-12 (CYANOCOBALAMIN) 1000 MCG tablet Take 1,000 mcg by mouth daily.    Yes Historical Provider, MD  Vitamin D, Ergocalciferol, (DRISDOL) 50000 UNITS CAPS capsule Take 50,000 Units by mouth every Wednesday.   Yes Historical Provider, MD    Allergies:  No Known Allergies  Social History:  Ambulatory walker   Lives at home  With family     reports that she quit smoking about 38 years ago. Her smoking use included Cigarettes. She has a 2.5 pack-year smoking history. She has never used smokeless tobacco. She reports that she drinks alcohol. She reports that she does not use illicit drugs.    Family History: family history includes Coronary artery disease in her brother; Diabetes in her brother; Heart attack (age of onset: 39) in her father.    Physical Exam: Patient Vitals for the past 24 hrs:  BP Temp Temp src Pulse Resp SpO2  05/07/14 2230 132/56 mmHg - - 62 18 98 %  05/07/14 2215 136/57 mmHg - - 63 23 98 %  05/07/14 2200 134/57 mmHg - - 58 17 97 %  05/07/14 2145 139/58 mmHg - -  58 16 98 %  05/07/14 2130 130/54 mmHg - - 55 13 98 %  05/07/14 2115 154/71 mmHg - - 70 21 98 %  05/07/14 2100 133/54 mmHg - - 61 19 97 %  05/07/14 2045 153/63 mmHg - - 66 18 100 %  05/07/14 1945 130/51 mmHg - - 65 - 96 %  05/07/14 1930 128/54 mmHg - - 60 - 97 %  05/07/14 1830 139/55 mmHg - - 64 - 98 %  05/07/14 1815 137/58 mmHg - - 68 - 100 %  05/07/14 1745 131/64 mmHg - - 66 18 100 %  05/07/14 1737 - - - - - 98 %  05/07/14 1731 169/75 mmHg 97.5 F (36.4 C) Oral 77 18 97 %  05/07/14 1730 140/56 mmHg - - 70 11 99 %    1. General:  in No Acute distress 2. Psychological: Alert and  Oriented 3. Head/ENT:    Dry Mucous Membranes                          Head Non traumatic, neck supple                          Normal  Dentition 4. SKIN: normal   Skin turgor,  Skin clean Dry and intact no rash 5. Heart: Regular rate and rhythm no Murmur, Rub or gallop 6. Lungs: Clear to auscultation bilaterally, no wheezes or crackles   7. Abdomen: Soft, non-tender, Non distended 8.  Lower extremities: no clubbing, cyanosis, or edema 9. Neurologically Grossly intact, moving all 4 extremities equally 10. MSK: Normal range of motion  body mass index is unknown because there is no weight on file.   Labs on Admission:   Recent Labs  05/07/14 1759  NA 136*  K 4.7  CL 98  CO2 26  GLUCOSE 106*  BUN 12  CREATININE 0.81  CALCIUM 9.7   No results found for this basename: AST, ALT, ALKPHOS, BILITOT, PROT, ALBUMIN,  in the last 72 hours No results found for this basename: LIPASE, AMYLASE,  in the last 72 hours  Recent Labs  05/07/14 1759  WBC 6.1  NEUTROABS 3.1  HGB 12.4  HCT 38.5  MCV 91.7  PLT 216   No results found for this basename: CKTOTAL, CKMB, CKMBINDEX, TROPONINI,  in the last 72 hours No results found for this basename: TSH, T4TOTAL, FREET3, T3FREE, THYROIDAB,  in the last 72 hours No results found for this basename: VITAMINB12, FOLATE, FERRITIN, TIBC, IRON, RETICCTPCT,  in the last 72 hours No results found for this basename: HGBA1C    The CrCl is unknown because both a height and weight (above a minimum accepted value) are required for this calculation. ABG No results found for this basename: phart, pco2, po2, hco3, tco2, acidbasedef, o2sat     No results found for this basename: DDIMER     Other results:  I have pearsonaly reviewed this: ECG REPORT  Rate:73   Rhythm: NS ST&T Change: Nonspecific changes  BNP (last 3 results) No results found for this basename: PROBNP,  in the last 8760 hours  There were no vitals filed for this visit.   Cultures: No results found for this basename: sdes, specrequest, cult, reptstatus   Radiological Exams on Admission: Dg Chest 2 View  05/07/2014   CLINICAL DATA:  Chest pain, palpitations  EXAM: CHEST  2 VIEW  COMPARISON:  10/12/2013  FINDINGS: Cardiomediastinal  silhouette is stable. Atherosclerotic calcifications of thoracic aorta again noted. Osteopenia and mild degenerative changes thoracic  spine. No acute infiltrate or pleural effusion. No pulmonary edema.  IMPRESSION: No active cardiopulmonary disease.   Electronically Signed   By: Lahoma Crocker M.D.   On: 05/07/2014 19:07   Ct Angio Chest Aortic Dissect W &/or W/o  05/07/2014   CLINICAL DATA:  Mid back pain starting last night. Nausea. History of sternal fracture 09/2013. Initial evaluation.  EXAM: CT ANGIOGRAPHY CHEST  CT ABDOMEN AND PELVIS WITH CONTRAST  TECHNIQUE: Multidetector CT imaging of the chest was performed using the standard protocol during bolus administration of intravenous contrast. Multiplanar CT image reconstructions and MIPs were obtained to evaluate the vascular anatomy. Multidetector CT imaging of the abdomen and pelvis was performed using the standard protocol during bolus administration of intravenous contrast.  CONTRAST:  174mL OMNIPAQUE IOHEXOL 350 MG/ML SOLN  COMPARISON:  Chest x-ray 05/07/2014.  Chest CT 12/30/2013.  FINDINGS: CTA CHEST FINDINGS  Thoracic aorta normal caliber. Pulmonary arteries are normal. Heart size normal. Coronary artery disease.  No significant mediastinal or hilar adenopathy. Thoracic esophagus is unremarkable.  Large airways are patent. 8 mm nodule right middle lobe unchanged. Bibasilar atelectasis and/or mild infiltrates.  1.3 cm nodule left thyroid gland, nonemergent thyroid ultrasound could be obtained to further evaluate. Shotty axillary lymph nodes. Degenerative changes thoracic spine. No acute bony abnormality.  CT ABDOMEN and PELVIS FINDINGS  Liver normal. Spleen normal. Pancreas normal. No biliary distention. Gallbladder is nondistended.  Adrenals normal. Simple cyst right kidney. No hydronephrosis or obstructing ureteral stone. The bladder is nondistended. Hysterectomy. No pelvic mass. No free pelvic fluid collections.  No significant adenopathy. Aortoiliac atherosclerotic vascular disease. Visceral atherosclerotic vascular disease. The visceral vessels are patent. Portal vein patent.   Appendiceal region normal. No bowel distention. No free air. Stomach is nondistended. No mesenteric mass. Tiny umbilical hernia RUQ fat only.  Degenerative changes lumbar spine.  Thoracolumbar spine scoliosis.  Review of the MIP images confirms the above findings.  IMPRESSION: 1. No pulmonary embolus.  Coronary artery disease. 2. 1.3 cm left thyroid gland nodule. Nonemergent ultrasound can be obtained to further evaluate. 3. No acute intra-abdominal abnormality.   Electronically Signed   By: Marcello Moores  Register   On: 05/07/2014 21:05   Ct Angio Abd/pel W/ And/or W/o  05/07/2014   CLINICAL DATA:  Mid back pain starting last night. Nausea. History of sternal fracture 09/2013. Initial evaluation.  EXAM: CT ANGIOGRAPHY CHEST  CT ABDOMEN AND PELVIS WITH CONTRAST  TECHNIQUE: Multidetector CT imaging of the chest was performed using the standard protocol during bolus administration of intravenous contrast. Multiplanar CT image reconstructions and MIPs were obtained to evaluate the vascular anatomy. Multidetector CT imaging of the abdomen and pelvis was performed using the standard protocol during bolus administration of intravenous contrast.  CONTRAST:  142mL OMNIPAQUE IOHEXOL 350 MG/ML SOLN  COMPARISON:  Chest x-ray 05/07/2014.  Chest CT 12/30/2013.  FINDINGS: CTA CHEST FINDINGS  Thoracic aorta normal caliber. Pulmonary arteries are normal. Heart size normal. Coronary artery disease.  No significant mediastinal or hilar adenopathy. Thoracic esophagus is unremarkable.  Large airways are patent. 8 mm nodule right middle lobe unchanged. Bibasilar atelectasis and/or mild infiltrates.  1.3 cm nodule left thyroid gland, nonemergent thyroid ultrasound could be obtained to further evaluate. Shotty axillary lymph nodes. Degenerative changes thoracic spine. No acute bony abnormality.  CT ABDOMEN and PELVIS FINDINGS  Liver normal. Spleen normal. Pancreas normal. No biliary distention. Gallbladder is nondistended.  Adrenals normal.  Simple cyst right kidney. No hydronephrosis or obstructing ureteral stone. The bladder is nondistended. Hysterectomy. No pelvic mass. No free pelvic fluid collections.  No significant adenopathy. Aortoiliac atherosclerotic vascular disease. Visceral atherosclerotic vascular disease. The visceral vessels are patent. Portal vein patent.  Appendiceal region normal. No bowel distention. No free air. Stomach is nondistended. No mesenteric mass. Tiny umbilical hernia RUQ fat only.  Degenerative changes lumbar spine.  Thoracolumbar spine scoliosis.  Review of the MIP images confirms the above findings.  IMPRESSION: 1. No pulmonary embolus.  Coronary artery disease. 2. 1.3 cm left thyroid gland nodule. Nonemergent ultrasound can be obtained to further evaluate. 3. No acute intra-abdominal abnormality.   Electronically Signed   By: Marcello Moores  Register   On: 05/07/2014 21:05    Chart has been reviewed  Assessment/Plan  80 yo F with hx of HTN here with atypical chest pain currently chest pain free.   Present on Admission:  . Chest pain - - given risk factors will admit, monitor on telemetry, cycle cardiac enzymes, obtain serial ECG. Further risk stratify with lipid panel, hgA1C, obtain TSH. Make sure patient is on Aspirin. Further treatment based on the currently pending results.  . Hypothyroidism - continue home meds as an outpatient obtain ultrasound to evaluate nodule  . Essential hypertension - continue home meds . GERD - protonix    Prophylaxis: SCD given history of venous malformation, Protonix  CODE STATUS:  FULL CODE  Other plan as per orders.  I have spent a total of 55 min on this admission  Rashad Auld 05/07/2014, 10:56 PM  [facility]  Pager [phone]   after 2 AM please page floor coverage PA If 7AM-7PM, please contact the day team taking care of the patient  [email or web address]  Password [email or web address]

## 2014-05-07 NOTE — ED Notes (Signed)
Patient transported to X-ray 

## 2014-05-08 ENCOUNTER — Observation Stay (HOSPITAL_COMMUNITY): Payer: Medicare Other

## 2014-05-08 DIAGNOSIS — R079 Chest pain, unspecified: Secondary | ICD-10-CM | POA: Diagnosis not present

## 2014-05-08 DIAGNOSIS — F329 Major depressive disorder, single episode, unspecified: Secondary | ICD-10-CM

## 2014-05-08 DIAGNOSIS — I10 Essential (primary) hypertension: Secondary | ICD-10-CM | POA: Diagnosis not present

## 2014-05-08 DIAGNOSIS — E785 Hyperlipidemia, unspecified: Secondary | ICD-10-CM | POA: Diagnosis not present

## 2014-05-08 DIAGNOSIS — E041 Nontoxic single thyroid nodule: Secondary | ICD-10-CM | POA: Diagnosis present

## 2014-05-08 LAB — TROPONIN I

## 2014-05-08 LAB — LIPID PANEL
Cholesterol: 166 mg/dL (ref 0–200)
HDL: 56 mg/dL (ref >39)
LDL CALC: 87 mg/dL (ref 0–99)
TRIGLYCERIDES: 114 mg/dL (ref <150)
Total CHOL/HDL Ratio: 3 RATIO
VLDL: 23 mg/dL (ref 0–40)

## 2014-05-08 LAB — TSH: TSH: 4.76 u[IU]/mL — AB (ref 0.350–4.500)

## 2014-05-08 MED ORDER — PRAVASTATIN SODIUM 40 MG PO TABS
40.0000 mg | ORAL_TABLET | Freq: Every day | ORAL | Status: DC
  Administered 2014-05-08: 40 mg via ORAL
  Filled 2014-05-08: qty 1

## 2014-05-08 MED ORDER — LEVOTHYROXINE SODIUM 50 MCG PO TABS
50.0000 ug | ORAL_TABLET | Freq: Every day | ORAL | Status: DC
  Administered 2014-05-08: 50 ug via ORAL
  Filled 2014-05-08 (×2): qty 1

## 2014-05-08 MED ORDER — MORPHINE SULFATE 2 MG/ML IJ SOLN: 2.0000 mg | INTRAMUSCULAR | Status: DC | PRN

## 2014-05-08 MED ORDER — ONDANSETRON HCL 4 MG/2ML IJ SOLN: 4.0000 mg | Freq: Four times a day (QID) | INTRAMUSCULAR | Status: DC | PRN

## 2014-05-08 MED ORDER — TECHNETIUM TC 99M SESTAMIBI GENERIC - CARDIOLITE
10.0000 | Freq: Once | INTRAVENOUS | Status: AC | PRN
  Administered 2014-05-08: 10 via INTRAVENOUS

## 2014-05-08 MED ORDER — ESCITALOPRAM OXALATE 5 MG PO TABS
5.0000 mg | ORAL_TABLET | Freq: Every day | ORAL | Status: DC
  Administered 2014-05-08: 5 mg via ORAL
  Filled 2014-05-08 (×2): qty 1

## 2014-05-08 MED ORDER — REGADENOSON 0.4 MG/5ML IV SOLN
0.4000 mg | Freq: Once | INTRAVENOUS | Status: AC
  Administered 2014-05-08: 0.4 mg via INTRAVENOUS
  Filled 2014-05-08: qty 5

## 2014-05-08 MED ORDER — GABAPENTIN 300 MG PO CAPS
300.0000 mg | ORAL_CAPSULE | Freq: Three times a day (TID) | ORAL | Status: DC
  Administered 2014-05-08 (×3): 300 mg via ORAL
  Filled 2014-05-08 (×4): qty 1

## 2014-05-08 MED ORDER — REGADENOSON 0.4 MG/5ML IV SOLN
INTRAVENOUS | Status: AC
  Administered 2014-05-08: 0.4 mg via INTRAVENOUS
  Filled 2014-05-08: qty 5

## 2014-05-08 MED ORDER — AMITRIPTYLINE HCL 25 MG PO TABS
25.0000 mg | ORAL_TABLET | Freq: Every day | ORAL | Status: DC
  Administered 2014-05-08: 25 mg via ORAL
  Filled 2014-05-08 (×2): qty 1

## 2014-05-08 MED ORDER — PNEUMOCOCCAL VAC POLYVALENT 25 MCG/0.5ML IJ INJ: 0.5000 mL | INJECTION | INTRAMUSCULAR | Status: DC

## 2014-05-08 MED ORDER — PANTOPRAZOLE SODIUM 40 MG PO TBEC
40.0000 mg | DELAYED_RELEASE_TABLET | Freq: Every day | ORAL | Status: DC
  Administered 2014-05-08: 40 mg via ORAL
  Filled 2014-05-08: qty 1

## 2014-05-08 MED ORDER — LISINOPRIL 20 MG PO TABS
20.0000 mg | ORAL_TABLET | Freq: Every day | ORAL | Status: DC
  Administered 2014-05-08: 20 mg via ORAL
  Filled 2014-05-08 (×2): qty 1

## 2014-05-08 MED ORDER — INFLUENZA VAC SPLIT QUAD 0.5 ML IM SUSY: 0.5000 mL | PREFILLED_SYRINGE | INTRAMUSCULAR | Status: DC

## 2014-05-08 MED ORDER — TECHNETIUM TC 99M SESTAMIBI GENERIC - CARDIOLITE
30.0000 | Freq: Once | INTRAVENOUS | Status: AC | PRN
  Administered 2014-05-08: 30 via INTRAVENOUS

## 2014-05-08 MED ORDER — ACETAMINOPHEN 325 MG PO TABS: 650.0000 mg | ORAL_TABLET | ORAL | Status: DC | PRN

## 2014-05-08 NOTE — ED Provider Notes (Signed)
I saw and evaluated the patient, reviewed the resident's note and I agree with the findings and plan.   .Face to face Exam:  General:  Awake HEENT:  Atraumatic Resp:  Normal effort Abd:  Nondistended Neuro:No focal weakness   EKG was reviewed and discussed with the resident   Dot Lanes, MD 05/08/14 2043

## 2014-05-08 NOTE — Progress Notes (Signed)
Negative stress test; discharged home, seft care. Spouse at bedside and patient given and explained d/c instructions.

## 2014-05-08 NOTE — Progress Notes (Signed)
Pt seen and examined, admitted this am with atypical chest pain, Followed by Dr.Berry has HTN, Dyslipidemia and family h/o CAD EKG non acute, troponin x2 negative D/w Dr.Bensimhon, he recommended a Myoview for this am, which is ordered -she is NPO -DC home later today if myoview normal  Domenic Polite, MD [phone]

## 2014-05-08 NOTE — Progress Notes (Signed)
Utilization Review completed.  

## 2014-05-08 NOTE — Discharge Summary (Signed)
Physician Discharge Summary  [name] KVQ:[id number] DOB: [birth year] DOA: 05/07/2014  PCP: Redge Gainer, MD  Admit date: 05/07/2014 Discharge date: 05/08/2014  Time spent: 45 minutes  Recommendations for Outpatient Follow-up:  1. PCP in 1 week 2. Thyroid nodule, needs workup with thyroid US and FNA as indicated per PCP  Discharge Diagnoses:    Atypical chest pain   Essential hypertension   Dyslipidemia   GERD   Seizure disorder   Hypothyroidism   Thyroid nodule   Discharge Condition:stable  Diet recommendation: heart healthy  Filed Weights   05/07/14 2346 05/08/14 0606  Weight: 97.206 kg (214 lb 4.8 oz) 97.07 kg (214 lb)    History of present illness:  80/F with h/o HTN, Dyslipidemia, GERD, h/o CVA, anxiety was admitted with atypical chest pain 10/9 at [facility] Course:  [name] was admitted with atypical chest pain which resolved prior to admission. Followed by Dr.Berry has HTN, Dyslipidemia and family h/o CAD  EKG non acute, troponin x3 negative  DUe to cardiac risk factors I Discussed with Dr.Bensimhon(Cardiologist on call), he recommended a Myoview, this was completed and showed normal EF and no inducible ischemia. She remained stable with no other symptoms and is discharged home in a stable condition. The rest of her medical problems were stable  Procedures:  Myoview: 10/10: no inducible ischemia, normal EF  Consultations:  D/w Cardiology Dr.Bensimhon via telephone  Discharge Exam: Filed Vitals:   05/08/14 1330  BP: 147/64  Pulse: 65  Temp: 97.7 F (36.5 C)  Resp: 16    General: AAOx3 Cardiovascular: S!S2/RRR Respiratory: CTAB  Discharge Instructions You were cared for by a hospitalist during your hospital stay. If you have any questions about your discharge medications or the care you received while you were in the hospital after you are discharged, you can call the unit and asked to speak with the hospitalist on call if the  hospitalist that took care of you is not available. Once you are discharged, your primary care physician will handle any further medical issues. Please note that NO REFILLS for any discharge medications will be authorized once you are discharged, as it is imperative that you return to your primary care physician (or establish a relationship with a primary care physician if you do not have one) for your aftercare needs so that they can reassess your need for medications and monitor your lab values.  Discharge Instructions   Diet - low sodium heart healthy    Complete by:  As directed      Increase activity slowly    Complete by:  As directed           Current Discharge Medication List    CONTINUE these medications which have NOT CHANGED   Details  amitriptyline (ELAVIL) 50 MG tablet Take 25 mg by mouth at bedtime.    escitalopram (LEXAPRO) 20 MG tablet Take 1 tablet (20 mg total) by mouth at bedtime. Qty: 90 tablet, Refills: 3   Associated Diagnoses: Depression    gabapentin (NEURONTIN) 300 MG capsule Take 1 capsule (300 mg total) by mouth 3 (three) times daily. Qty: 270 capsule, Refills: 3   Associated Diagnoses: Arthritis    levothyroxine (SYNTHROID, LEVOTHROID) 50 MCG tablet Take 1 tablet (50 mcg total) by mouth daily before breakfast. Qty: 30 tablet, Refills: 0   Associated Diagnoses: Unspecified hypothyroidism    lisinopril (PRINIVIL,ZESTRIL) 20 MG tablet Take 1 tablet (20 mg total) by mouth at bedtime. Qty: 90 tablet, Refills: 3  Associated Diagnoses: Essential hypertension, benign    lovastatin (MEVACOR) 40 MG tablet Take 1 tablet (40 mg total) by mouth at bedtime. Qty: 90 tablet, Refills: 3   Associated Diagnoses: Other and unspecified hyperlipidemia    omeprazole (PRILOSEC) 40 MG capsule Take 1 capsule (40 mg total) by mouth daily. Qty: 90 capsule, Refills: 3   Associated Diagnoses: GERD (gastroesophageal reflux disease)    vitamin B-12 (CYANOCOBALAMIN) 1000 MCG  tablet Take 1,000 mcg by mouth daily.    Vitamin D, Ergocalciferol, (DRISDOL) 50000 UNITS CAPS capsule Take 50,000 Units by mouth every Wednesday.       No Known Allergies Follow-up Information   Follow up with Redge Gainer, MD. Schedule an appointment as soon as possible for a visit in 1 week.   Specialty:  Family Medicine   Contact information:   [address] [place] [address] [phone]        The results of significant diagnostics from this hospitalization (including imaging, microbiology, ancillary and laboratory) are listed below for reference.    Significant Diagnostic Studies: Dg Chest 2 View  05/07/2014   CLINICAL DATA:  Chest pain, palpitations  EXAM: CHEST  2 VIEW  COMPARISON:  10/12/2013  FINDINGS: Cardiomediastinal silhouette is stable. Atherosclerotic calcifications of thoracic aorta again noted. Osteopenia and mild degenerative changes thoracic spine. No acute infiltrate or pleural effusion. No pulmonary edema.  IMPRESSION: No active cardiopulmonary disease.   Electronically Signed   By: Lahoma Crocker M.D.   On: 05/07/2014 19:07   Nm Myocar Multi W/spect W/wall Motion / Ef  05/08/2014   CLINICAL DATA:  Chest pain. Hypertension. Hyperlipidemia. Heart murmur.  EXAM: MYOCARDIAL IMAGING WITH SPECT (REST AND PHARMACOLOGIC-STRESS)  GATED LEFT VENTRICULAR WALL MOTION STUDY  LEFT VENTRICULAR EJECTION FRACTION  TECHNIQUE: Standard myocardial SPECT imaging was performed after resting intravenous injection of 10 mCi Tc-39m sestamibi. Subsequently, intravenous infusion of Lexiscan was performed under the supervision of the Cardiology staff. At peak effect of the drug, 30 mCi Tc-27m sestamibi was injected intravenously and standard myocardial SPECT imaging was performed. Quantitative gated imaging was also performed to evaluate left ventricular wall motion, and estimate left ventricular ejection fraction.  COMPARISON:  None.  FINDINGS: Perfusion: No decreased activity in the left  ventricle on stress imaging to suggest reversible ischemia or infarction.  Wall Motion: Normal left ventricular wall motion. No left ventricular dilation.  Left Ventricular Ejection Fraction: 86 %  End diastolic volume 58 ml  End systolic volume 8 ml  IMPRESSION: 1. No reversible ischemia or infarction.  2. Normal left ventricular wall motion.  3. Left ventricular ejection fraction 86%  4. Low-risk stress test findings*.  *2012 Appropriate Use Criteria for Coronary Revascularization Focused Update: J Am Coll Cardiol. 5701;77(9):390-300. http://content.airportbarriers.com.aspx?articleid=1201161   Electronically Signed   By: Earle Gell M.D.   On: 05/08/2014 16:41   Ct Angio Chest Aortic Dissect W &/or W/o  05/07/2014   CLINICAL DATA:  Mid back pain starting last night. Nausea. History of sternal fracture 09/2013. Initial evaluation.  EXAM: CT ANGIOGRAPHY CHEST  CT ABDOMEN AND PELVIS WITH CONTRAST  TECHNIQUE: Multidetector CT imaging of the chest was performed using the standard protocol during bolus administration of intravenous contrast. Multiplanar CT image reconstructions and MIPs were obtained to evaluate the vascular anatomy. Multidetector CT imaging of the abdomen and pelvis was performed using the standard protocol during bolus administration of intravenous contrast.  CONTRAST:  173mL OMNIPAQUE IOHEXOL 350 MG/ML SOLN  COMPARISON:  Chest x-ray 05/07/2014.  Chest  CT 12/30/2013.  FINDINGS: CTA CHEST FINDINGS  Thoracic aorta normal caliber. Pulmonary arteries are normal. Heart size normal. Coronary artery disease.  No significant mediastinal or hilar adenopathy. Thoracic esophagus is unremarkable.  Large airways are patent. 8 mm nodule right middle lobe unchanged. Bibasilar atelectasis and/or mild infiltrates.  1.3 cm nodule left thyroid gland, nonemergent thyroid ultrasound could be obtained to further evaluate. Shotty axillary lymph nodes. Degenerative changes thoracic spine. No acute bony abnormality.  CT  ABDOMEN and PELVIS FINDINGS  Liver normal. Spleen normal. Pancreas normal. No biliary distention. Gallbladder is nondistended.  Adrenals normal. Simple cyst right kidney. No hydronephrosis or obstructing ureteral stone. The bladder is nondistended. Hysterectomy. No pelvic mass. No free pelvic fluid collections.  No significant adenopathy. Aortoiliac atherosclerotic vascular disease. Visceral atherosclerotic vascular disease. The visceral vessels are patent. Portal vein patent.  Appendiceal region normal. No bowel distention. No free air. Stomach is nondistended. No mesenteric mass. Tiny umbilical hernia RUQ fat only.  Degenerative changes lumbar spine.  Thoracolumbar spine scoliosis.  Review of the MIP images confirms the above findings.  IMPRESSION: 1. No pulmonary embolus.  Coronary artery disease. 2. 1.3 cm left thyroid gland nodule. Nonemergent ultrasound can be obtained to further evaluate. 3. No acute intra-abdominal abnormality.   Electronically Signed   By: Marcello Moores  Register   On: 05/07/2014 21:05   Ct Angio Abd/pel W/ And/or W/o  05/07/2014   CLINICAL DATA:  Mid back pain starting last night. Nausea. History of sternal fracture 09/2013. Initial evaluation.  EXAM: CT ANGIOGRAPHY CHEST  CT ABDOMEN AND PELVIS WITH CONTRAST  TECHNIQUE: Multidetector CT imaging of the chest was performed using the standard protocol during bolus administration of intravenous contrast. Multiplanar CT image reconstructions and MIPs were obtained to evaluate the vascular anatomy. Multidetector CT imaging of the abdomen and pelvis was performed using the standard protocol during bolus administration of intravenous contrast.  CONTRAST:  174mL OMNIPAQUE IOHEXOL 350 MG/ML SOLN  COMPARISON:  Chest x-ray 05/07/2014.  Chest CT 12/30/2013.  FINDINGS: CTA CHEST FINDINGS  Thoracic aorta normal caliber. Pulmonary arteries are normal. Heart size normal. Coronary artery disease.  No significant mediastinal or hilar adenopathy. Thoracic  esophagus is unremarkable.  Large airways are patent. 8 mm nodule right middle lobe unchanged. Bibasilar atelectasis and/or mild infiltrates.  1.3 cm nodule left thyroid gland, nonemergent thyroid ultrasound could be obtained to further evaluate. Shotty axillary lymph nodes. Degenerative changes thoracic spine. No acute bony abnormality.  CT ABDOMEN and PELVIS FINDINGS  Liver normal. Spleen normal. Pancreas normal. No biliary distention. Gallbladder is nondistended.  Adrenals normal. Simple cyst right kidney. No hydronephrosis or obstructing ureteral stone. The bladder is nondistended. Hysterectomy. No pelvic mass. No free pelvic fluid collections.  No significant adenopathy. Aortoiliac atherosclerotic vascular disease. Visceral atherosclerotic vascular disease. The visceral vessels are patent. Portal vein patent.  Appendiceal region normal. No bowel distention. No free air. Stomach is nondistended. No mesenteric mass. Tiny umbilical hernia RUQ fat only.  Degenerative changes lumbar spine.  Thoracolumbar spine scoliosis.  Review of the MIP images confirms the above findings.  IMPRESSION: 1. No pulmonary embolus.  Coronary artery disease. 2. 1.3 cm left thyroid gland nodule. Nonemergent ultrasound can be obtained to further evaluate. 3. No acute intra-abdominal abnormality.   Electronically Signed   By: Marcello Moores  Register   On: 05/07/2014 21:05    Microbiology: No results found for this or any previous visit (from the past 240 hour(s)).   Labs: Basic Metabolic Panel:  Recent Labs Lab 05/07/14  1759  NA 136*  K 4.7  CL 98  CO2 26  GLUCOSE 106*  BUN 12  CREATININE 0.81  CALCIUM 9.7   Liver Function Tests: No results found for this basename: AST, ALT, ALKPHOS, BILITOT, PROT, ALBUMIN,  in the last 168 hours No results found for this basename: LIPASE, AMYLASE,  in the last 168 hours No results found for this basename: AMMONIA,  in the last 168 hours CBC:  Recent Labs Lab 05/07/14 1759  WBC 6.1   NEUTROABS 3.1  HGB 12.4  HCT 38.5  MCV 91.7  PLT 216   Cardiac Enzymes:  Recent Labs Lab 05/08/14 0010 05/08/14 1424  TROPONINI <0.30 <0.30   BNP: BNP (last 3 results) No results found for this basename: PROBNP,  in the last 8760 hours CBG: No results found for this basename: GLUCAP,  in the last 168 hours     Signed:  Bradie Sangiovanni  [facility] 05/08/2014, 4:59 PM

## 2014-05-18 ENCOUNTER — Ambulatory Visit: Payer: Medicare Other | Admitting: Cardiovascular Disease

## 2014-05-24 ENCOUNTER — Ambulatory Visit (INDEPENDENT_AMBULATORY_CARE_PROVIDER_SITE_OTHER): Payer: Medicare Other | Admitting: Cardiovascular Disease

## 2014-05-24 ENCOUNTER — Encounter: Payer: Self-pay | Admitting: Cardiovascular Disease

## 2014-05-24 VITALS — BP 140/74 | HR 75 | Ht 63.5 in | Wt 225.0 lb

## 2014-05-24 DIAGNOSIS — I10 Essential (primary) hypertension: Secondary | ICD-10-CM

## 2014-05-24 NOTE — Assessment & Plan Note (Signed)
On statin therapy with recent lipid profile performed 05/08/14 revealing a total cholesterol of 186, LDL of 87 and HDL of 56

## 2014-05-24 NOTE — Assessment & Plan Note (Signed)
Controlled on current medications 

## 2014-05-24 NOTE — Patient Instructions (Signed)
Your physician wants you to follow-up in: 1 year with Dr Berry. You will receive a reminder letter in the mail two months in advance. If you don't receive a letter, please call our office to schedule the follow-up appointment.  

## 2014-05-24 NOTE — Progress Notes (Signed)
05/24/2014 [name]   [birth year]  [id number]  Primary Physician Redge Gainer, MD Primary Cardiologist: Lorretta Harp MD Renae Gloss   HPI:  [name] is a 80 year old moderately overweight married Caucasian female mother of 77 (son 26 years old died 51/2 years ago prostate CA), grandmother of 2 grandchildren, who was formally a patient of Dr. Delfino Lovett Lowella Fairy. She has no prior cardiac history. She does have a history of treated hypertension and hyperlipidemia as well as a family history of heart disease. Her father died at age 61 from a heart attack as did her brother. She has another brother who is in open-heart surgery. She has never had a heart attack but has had "mini strokes" in the past. She does get dyspnea on exertion but denies chest pain. I left cell her one year ago. She apparently had Ace sternal fracture secondary to a fall 6 months ago and had a chest CT that showed abnormalities in her lung parenchyma which has been followed by serial CT scans. She was recently admitted to, hospital for chest pain rule out MI. CT angiogram was negative for PE. A Myoview stress test was negative as well.    Current Outpatient Prescriptions  Medication Sig Dispense Refill  . amitriptyline (ELAVIL) 50 MG tablet Take 25 mg by mouth at bedtime.      Marland Kitchen escitalopram (LEXAPRO) 20 MG tablet Take 1 tablet (20 mg total) by mouth at bedtime.  90 tablet  3  . gabapentin (NEURONTIN) 300 MG capsule Take 1 capsule (300 mg total) by mouth 3 (three) times daily.  270 capsule  3  . levothyroxine (SYNTHROID, LEVOTHROID) 50 MCG tablet Take 1 tablet (50 mcg total) by mouth daily before breakfast.  30 tablet  0  . lisinopril (PRINIVIL,ZESTRIL) 20 MG tablet Take 1 tablet (20 mg total) by mouth at bedtime.  90 tablet  3  . lovastatin (MEVACOR) 40 MG tablet Take 1 tablet (40 mg total) by mouth at bedtime.  90 tablet  3  . omeprazole (PRILOSEC) 40 MG capsule Take 1 capsule (40 mg total) by mouth  daily.  90 capsule  3  . vitamin B-12 (CYANOCOBALAMIN) 1000 MCG tablet Take 1,000 mcg by mouth daily.      . Vitamin D, Ergocalciferol, (DRISDOL) 50000 UNITS CAPS capsule Take 50,000 Units by mouth every Wednesday.       No current facility-administered medications for this visit.    No Known Allergies  History   Social History  . Marital Status: Married    Spouse Name: N/A    Number of Children: N/A  . Years of Education: N/A   Occupational History  . Not on file.   Social History Main Topics  . Smoking status: Former Smoker -- 0.25 packs/day for 10 years    Types: Cigarettes    Quit date: 07/31/1975  . Smokeless tobacco: Never Used  . Alcohol Use: Yes     Comment: 2 glasses a year  . Drug Use: No  . Sexual Activity: Not on file   Other Topics Concern  . Not on file   Social History Narrative  . No narrative on file     Review of Systems: General: negative for chills, fever, night sweats or weight changes.  Cardiovascular: negative for chest pain, dyspnea on exertion, edema, orthopnea, palpitations, paroxysmal nocturnal dyspnea or shortness of breath Dermatological: negative for rash Respiratory: negative for cough or wheezing Urologic: negative for hematuria Abdominal: negative for nausea, vomiting, diarrhea,  bright red blood per rectum, melena, or hematemesis Neurologic: negative for visual changes, syncope, or dizziness All other systems reviewed and are otherwise negative except as noted above.    Blood pressure 140/74, pulse 75, height 5' 3.5" (1.613 m), weight 225 lb (102.059 kg).  General appearance: alert and no distress Neck: no adenopathy, no carotid bruit, no JVD, supple, symmetrical, trachea midline and thyroid not enlarged, symmetric, no tenderness/mass/nodules Lungs: clear to auscultation bilaterally Heart: regular rate and rhythm, S1, S2 normal, no murmur, click, rub or gallop Extremities: extremities normal, atraumatic, no cyanosis or  edema  EKG normal sinus rhythm of 75 without ST or T-wave changes  ASSESSMENT AND PLAN:   HYPERLIPIDEMIA On statin therapy with recent lipid profile performed 05/08/14 revealing a total cholesterol of 186, LDL of 87 and HDL of 56  Essential hypertension Controlled on current medications      Lorretta Harp MD [facility], [facility] 05/24/2014 3:31 PM

## 2014-05-25 ENCOUNTER — Encounter: Payer: Self-pay | Admitting: Family Medicine

## 2014-05-25 ENCOUNTER — Telehealth: Payer: Self-pay

## 2014-05-25 ENCOUNTER — Ambulatory Visit (INDEPENDENT_AMBULATORY_CARE_PROVIDER_SITE_OTHER): Payer: Medicare Other | Admitting: Family Medicine

## 2014-05-25 VITALS — BP 121/61 | HR 77 | Temp 98.5°F | Ht 64.0 in | Wt 225.0 lb

## 2014-05-25 DIAGNOSIS — I10 Essential (primary) hypertension: Secondary | ICD-10-CM

## 2014-05-25 DIAGNOSIS — R911 Solitary pulmonary nodule: Secondary | ICD-10-CM | POA: Diagnosis not present

## 2014-05-25 DIAGNOSIS — R9389 Abnormal findings on diagnostic imaging of other specified body structures: Secondary | ICD-10-CM

## 2014-05-25 MED ORDER — AMLODIPINE BESYLATE 5 MG PO TABS: 5.0000 mg | ORAL_TABLET | Freq: Every day | ORAL | Status: DC

## 2014-05-25 NOTE — Telephone Encounter (Signed)
Patient states that she has a spot on her lungs and needs a pulmonary referral ASAP

## 2014-05-25 NOTE — Progress Notes (Signed)
   Subjective:    Patient ID: [name], female    DOB: [birth year], 80 y.o.   MRN: [id number]  HPI This 80 y.o. female presents for evaluation of persistent cough and follow up on pulmonary nodule. She was recently hospitalized for chest pain and she was advised to go see a pulmonologist about 8 mm pulmonary nodule in right lung.  She has no acute complaints.   Review of Systems No chest pain, SOB, HA, dizziness, vision change, N/V, diarrhea, constipation, dysuria, urinary urgency or frequency, myalgias, arthralgias or rash.     Objective:   Physical Exam Vital signs noted  Well developed well nourished female.  HEENT - Head atraumatic Normocephalic                Eyes - PERRLA, Conjuctiva - clear Sclera- Clear EOMI                Ears - EAC's Wnl TM's Wnl Gross Hearing WNL                Nose - Nares patent                 Throat - oropharanx wnl Respiratory - Lungs CTA bilateral Cardiac - RRR S1 and S2 without murmur GI - Abdomen soft Nontender and bowel sounds active x 4 Extremities - No edema. Neuro - Grossly intact.       Assessment & Plan:  Pulmonary nodule - Plan: Ambulatory referral to Pulmonology  Essential hypertension - Plan: amLODipine (NORVASC) 5 MG tablet DC lisinopril and start amlodipine.  Lysbeth Penner FNP

## 2014-05-25 NOTE — Telephone Encounter (Signed)
Ct ordered

## 2014-05-28 ENCOUNTER — Ambulatory Visit
Admission: RE | Admit: 2014-05-28 | Discharge: 2014-05-28 | Disposition: A | Discharge status: Home / Self Care | Payer: Medicare Other | Source: Ambulatory Visit | Attending: Nurse Practitioner | Admitting: Nurse Practitioner

## 2014-05-28 DIAGNOSIS — E079 Disorder of thyroid, unspecified: Secondary | ICD-10-CM | POA: Diagnosis not present

## 2014-05-28 DIAGNOSIS — R9389 Abnormal findings on diagnostic imaging of other specified body structures: Secondary | ICD-10-CM

## 2014-05-28 DIAGNOSIS — R911 Solitary pulmonary nodule: Secondary | ICD-10-CM | POA: Diagnosis not present

## 2014-05-28 MED ORDER — IOHEXOL 300 MG/ML  SOLN
75.0000 mL | Freq: Once | INTRAMUSCULAR | Status: AC | PRN
  Administered 2014-05-28: 75 mL via INTRAVENOUS

## 2014-06-02 ENCOUNTER — Encounter: Payer: Self-pay | Admitting: Internal Medicine

## 2014-06-02 ENCOUNTER — Ambulatory Visit (INDEPENDENT_AMBULATORY_CARE_PROVIDER_SITE_OTHER): Payer: Medicare Other | Admitting: Internal Medicine

## 2014-06-02 VITALS — BP 118/70 | HR 67 | Ht 63.5 in | Wt 228.0 lb

## 2014-06-02 DIAGNOSIS — R05 Cough: Secondary | ICD-10-CM

## 2014-06-02 DIAGNOSIS — R058 Other specified cough: Secondary | ICD-10-CM

## 2014-06-02 DIAGNOSIS — R911 Solitary pulmonary nodule: Secondary | ICD-10-CM | POA: Diagnosis not present

## 2014-06-02 NOTE — Progress Notes (Signed)
Subjective:    Patient ID: [name], female    DOB: May 15, 1934  MRN: [id number]  HPI  79 yowf quit smoking 1977 and "always sob" /chronic dry cough referred to pulmonary clinic by Dr Morrie Sheldon 06/02/2014 for eval of SPN incidentally discovered after fell  10/27/13 in R ML x 8 mm.   06/02/2014 [facility] office visit/ [name]   Chief Complaint  Patient presents with  . Pulmonary Consult    Referred by Dr. Laurance Flatten for eval of pulmonary nodule. Pt fell and broke breast bone in March 2015 and nodule was found during workup for this. She states that she has had SOB "forever" and c/o cough x 6 months- non prod.   up until age 39 "could out run anybody and do yardwork but then  Developed  szs and cerebellar degen slowed down quite a bit and now w/c when goes out and walker at home.  Cough x 6 months more likely daytime but occ wakes up noct (up to twice weekly), dry cough, assoc with dysphagia but this comes and goes  No obvious other patterns in day to day or daytime variabilty or assoc limiting sob or cp or chest tightness, subjective wheeze overt sinus or hb symptoms. No unusual exp hx or h/o childhood pna/ asthma or knowledge of premature birth.  Sleeping ok without nocturnal  or early am exacerbation  of respiratory  c/o's or need for noct saba. Also denies any obvious fluctuation of symptoms with weather or environmental changes or other aggravating or alleviating factors except as outlined above   Current Medications, Allergies, Complete Past Medical History, Past Surgical History, Family History, and Social History were reviewed in [employer] record.             Review of Systems  Constitutional: Negative for fever, chills and unexpected weight change.  HENT: Positive for trouble swallowing. Negative for congestion, dental problem, ear pain, nosebleeds, postnasal drip, rhinorrhea, sinus pressure, sneezing, sore throat and voice change.   Eyes:  Negative for visual disturbance.  Respiratory: Positive for cough and shortness of breath. Negative for choking.   Cardiovascular: Positive for chest pain. Negative for leg swelling.  Gastrointestinal: Positive for abdominal pain. Negative for vomiting and diarrhea.  Genitourinary: Negative for difficulty urinating.  Musculoskeletal: Positive for arthralgias.  Skin: Negative for rash.  Neurological: Positive for headaches. Negative for tremors and syncope.  Hematological: Does not bruise/bleed easily.       Objective:   Physical Exam   Wt Readings from Last 3 Encounters:  06/02/14 228 lb (103.42 kg)  05/25/14 225 lb (102.059 kg)  05/24/14 225 lb (102.059 kg)    Vital signs reviewed   Obese wf very unsteady on her feet, 2 person assist to get on exam table  HEENT: nl dentition, turbinates, and orophanx. Nl external ear canals without cough reflex   NECK :  without JVD/Nodes/TM/ nl carotid upstrokes bilaterally   LUNGS: no acc muscle use, clear to A and P bilaterally without cough on insp or exp maneuvers   CV:  RRR  no s3 or murmur or increase in P2, no edema   ABD:  soft and nontender with nl excursion in the supine position. No bruits or organomegaly, bowel sounds nl  MS:  warm without deformities, calf tenderness, cyanosis or clubbing  SKIN: warm and dry without lesions    NEURO:  alert, approp, cerebellar tremor bilaterally  05/28/14 CT chest  Stable nodule on the right  middle lobe. Given its stability at 7 months a follow-up CT is recommended in 1 year to assess for stability. If the lesion is stable at that time, no further follow-up is necessary.         Assessment & Plan:

## 2014-06-02 NOTE — Assessment & Plan Note (Addendum)
-   incidental CT 10/27/13 RML x 8 mm > recheck 05/28/14  No change > placed in tickle file for 05/29/15     > 15 y prior to detection/ low but not zero risk reviewed  Discussed in detail all the  indications, usual  risks and alternatives  relative to the benefits with patient who agrees to proceed withconservative f/u as outlined y.

## 2014-06-02 NOTE — Patient Instructions (Addendum)
You will need a follow up CT Chest in 04/2015   Try prilosec 20mg   Take 30-60 min before first meal of the day and Pepcid 20 mg one bedtime    GERD (REFLUX)  is an extremely common cause of respiratory symptoms just like yours , many times with no obvious heartburn at all.    It can be treated with medication, but also with lifestyle changes including avoidance of late meals, excessive alcohol, smoking cessation, and avoid fatty foods, chocolate, peppermint, colas, red wine, and acidic juices such as orange juice.  NO MINT OR MENTHOL PRODUCTS SO NO COUGH DROPS  USE SUGARLESS CANDY INSTEAD (Jolley ranchers or Stover's or Life Savers) or even ice chips will also do - the key is to swallow to prevent all throat clearing. NO OIL BASED VITAMINS - use powdered substitutes.

## 2014-06-02 NOTE — Assessment & Plan Note (Signed)
The most common causes of chronic cough in immunocompetent adults include the following: upper airway cough syndrome (UACS), previously referred to as postnasal drip syndrome (PNDS), which is caused by variety of rhinosinus conditions; (2) asthma; (3) GERD; (4) chronic bronchitis from cigarette smoking or other inhaled environmental irritants; (5) nonasthmatic eosinophilic bronchitis; and (6) bronchiectasis.   These conditions, singly or in combination, have accounted for up to 94% of the causes of chronic cough in prospective studies.   Other conditions have constituted no >6% of the causes in prospective studies These have included bronchogenic carcinoma, chronic interstitial pneumonia, sarcoidosis, left ventricular failure, ACEI-induced cough, and aspiration from a condition associated with pharyngeal dysfunction.    Chronic cough is often simultaneously caused by more than one condition. A single cause has been found from 38 to 82% of the time, multiple causes from 18 to 62%. Multiply caused cough has been the result of three diseases up to 42% of the time.       Based on hx and exam, this is most likely not related to CT chest but rather :  Classic Upper airway cough syndrome, so named because it's frequently impossible to sort out how much is  CR/sinusitis with freq throat clearing (which can be related to primary GERD)   vs  causing  secondary (" extra esophageal")  GERD from wide swings in gastric pressure that occur with throat clearing, often  promoting self use of mint and menthol lozenges that reduce the lower esophageal sphincter tone and exacerbate the problem further in a cyclical fashion.   These are the same pts (now being labeled as having "irritable larynx syndrome" by some cough centers) who not infrequently have a history of having failed to tolerate ace inhibitors,  dry powder inhalers or biphosphonates or report having atypical reflux symptoms that don't respond to standard doses  of PPI , and are easily confused as having aecopd or asthma flares by even experienced allergists/ pulmonologists.   The first step is to maximize acid suppression and  then regroup if the cough persists.  See instructions for specific recommendations which were reviewed directly with the patient who was given a copy with highlighter outlining the key components.

## 2014-06-03 ENCOUNTER — Telehealth: Payer: Self-pay

## 2014-06-03 NOTE — Telephone Encounter (Signed)
Pt aware of stability and will think about the repeat CT.Put on reminder list for 10/2013

## 2014-06-14 ENCOUNTER — Other Ambulatory Visit: Payer: Self-pay | Admitting: Family Medicine

## 2014-07-15 NOTE — Telephone Encounter (Signed)
Close encounter 

## 2014-07-26 ENCOUNTER — Other Ambulatory Visit: Payer: Self-pay | Admitting: Family Medicine

## 2014-07-27 ENCOUNTER — Other Ambulatory Visit: Payer: Self-pay | Admitting: Family Medicine

## 2014-08-01 ENCOUNTER — Other Ambulatory Visit: Payer: Self-pay | Admitting: Family Medicine

## 2014-08-02 ENCOUNTER — Ambulatory Visit: Payer: Medicare Other | Admitting: Family Medicine

## 2014-08-11 ENCOUNTER — Telehealth: Payer: Self-pay | Admitting: Family Medicine

## 2014-08-11 NOTE — Telephone Encounter (Signed)
She was unsure what amalodipine was used for.  Explained it was a blood pressure medicine.

## 2014-08-14 ENCOUNTER — Other Ambulatory Visit: Payer: Self-pay | Admitting: Family Medicine

## 2014-09-16 DIAGNOSIS — R69 Illness, unspecified: Secondary | ICD-10-CM | POA: Diagnosis not present

## 2014-10-04 ENCOUNTER — Other Ambulatory Visit: Payer: Self-pay | Admitting: Family Medicine

## 2014-10-11 ENCOUNTER — Other Ambulatory Visit: Payer: Self-pay | Admitting: Family Medicine

## 2014-10-12 ENCOUNTER — Encounter: Payer: Self-pay | Admitting: Family Medicine

## 2014-10-12 ENCOUNTER — Ambulatory Visit (INDEPENDENT_AMBULATORY_CARE_PROVIDER_SITE_OTHER): Payer: Medicare Other | Admitting: Family Medicine

## 2014-10-12 VITALS — BP 114/56 | HR 95 | Temp 97.0°F | Ht 63.5 in | Wt 228.2 lb

## 2014-10-12 DIAGNOSIS — E079 Disorder of thyroid, unspecified: Secondary | ICD-10-CM | POA: Diagnosis not present

## 2014-10-12 DIAGNOSIS — R300 Dysuria: Secondary | ICD-10-CM | POA: Diagnosis not present

## 2014-10-12 DIAGNOSIS — Z23 Encounter for immunization: Secondary | ICD-10-CM | POA: Diagnosis not present

## 2014-10-12 DIAGNOSIS — E785 Hyperlipidemia, unspecified: Secondary | ICD-10-CM | POA: Diagnosis not present

## 2014-10-12 DIAGNOSIS — E038 Other specified hypothyroidism: Secondary | ICD-10-CM | POA: Diagnosis not present

## 2014-10-12 DIAGNOSIS — E559 Vitamin D deficiency, unspecified: Secondary | ICD-10-CM

## 2014-10-12 DIAGNOSIS — I10 Essential (primary) hypertension: Secondary | ICD-10-CM | POA: Diagnosis not present

## 2014-10-12 LAB — POCT CBC
Granulocyte percent: 60.9 %G (ref 37–80)
HCT, POC: 46.1 % (ref 37.7–47.9)
Hemoglobin: 13.8 g/dL (ref 12.2–16.2)
Lymph, poc: 1.9 (ref 0.6–3.4)
MCH: 27.4 pg (ref 27–31.2)
MCHC: 30 g/dL — AB (ref 31.8–35.4)
MCV: 91.4 fL (ref 80–97)
MPV: 6.7 fL (ref 0–99.8)
PLATELET COUNT, POC: 295 10*3/uL (ref 142–424)
POC Granulocyte: 3.9 (ref 2–6.9)
POC LYMPH PERCENT: 29.5 %L (ref 10–50)
RBC: 5.04 M/uL (ref 4.04–5.48)
RDW, POC: 14.9 %
WBC: 6.4 10*3/uL (ref 4.6–10.2)

## 2014-10-12 LAB — POCT UA - MICROSCOPIC ONLY
Bacteria, U Microscopic: NEGATIVE
Casts, Ur, LPF, POC: NEGATIVE
Crystals, Ur, HPF, POC: NEGATIVE
Mucus, UA: NEGATIVE
RBC, urine, microscopic: NEGATIVE
Yeast, UA: NEGATIVE

## 2014-10-12 LAB — POCT URINALYSIS DIPSTICK
BILIRUBIN UA: NEGATIVE
Blood, UA: NEGATIVE
Glucose, UA: NEGATIVE
Ketones, UA: NEGATIVE
Nitrite, UA: NEGATIVE
PH UA: 6
Protein, UA: NEGATIVE
Spec Grav, UA: <=1.005
Urobilinogen, UA: NEGATIVE

## 2014-10-12 MED ORDER — OMEPRAZOLE 40 MG PO CPDR: 40.0000 mg | DELAYED_RELEASE_CAPSULE | Freq: Every day | ORAL | Status: DC

## 2014-10-12 MED ORDER — LOVASTATIN 40 MG PO TABS: 40.0000 mg | ORAL_TABLET | Freq: Every day | ORAL | Status: DC

## 2014-10-12 MED ORDER — ESCITALOPRAM OXALATE 20 MG PO TABS: 20.0000 mg | ORAL_TABLET | Freq: Every day | ORAL | Status: DC

## 2014-10-12 MED ORDER — LEVOTHYROXINE SODIUM 50 MCG PO TABS: 50.0000 ug | ORAL_TABLET | Freq: Every day | ORAL | Status: DC

## 2014-10-12 MED ORDER — LISINOPRIL 20 MG PO TABS: 20.0000 mg | ORAL_TABLET | Freq: Every day | ORAL | Status: DC

## 2014-10-12 MED ORDER — AMITRIPTYLINE HCL 50 MG PO TABS: 25.0000 mg | ORAL_TABLET | Freq: Every day | ORAL | Status: DC

## 2014-10-12 MED ORDER — AMLODIPINE BESYLATE 5 MG PO TABS: 5.0000 mg | ORAL_TABLET | Freq: Every day | ORAL | Status: DC

## 2014-10-12 MED ORDER — GABAPENTIN 300 MG PO CAPS: 300.0000 mg | ORAL_CAPSULE | Freq: Three times a day (TID) | ORAL | Status: DC

## 2014-10-12 NOTE — Progress Notes (Signed)
Subjective:  Patient ID: [name], female    DOB: 14-May-1934  Age: 81 y.o. MRN: [id number]  CC: Hypertension; Hyperlipidemia; Hypothyroidism; Gastrophageal Reflux; and Vit D deficiency   HPI [name] presents forPatient in for follow-up of elevated cholesterol. Doing well without complaints on current medication. Denies side effects of statin including myalgia and arthralgia and nausea. Also in today for liver function testing. Currently no chest pain, shortness of breath or other cardiovascular related symptoms noted.  Patient presents for follow-up on  thyroid. She has a history of hypothyroidism for many years. It has been stable recently. Pt. denies any change in  voice, loss of hair, heat or cold intolerance. Energy level has been adequate to good. She denies constipation and diarrhea. No myxedema. Medication is as noted below. Verified that pt is taking it daily on an empty stomach. Well tolerated.   follow-up of hypertension. Patient has no history of headache chest pain or shortness of breath or recent cough. Patient also denies symptoms of TIA such as numbness weakness lateralizing. Patient checks  blood pressure at home and has not had any elevated readings recently. Patient denies side effects from his medication. States taking it regularly.  Patient in for follow-up of GERD. Currently asymptomatic taking  PPI daily. There is no chest pain or heartburn. No hematemesis and no melena. No dysphagia or choking. Onset is remote. Progression is stable. Complicating factors, none.   Vitamin D deficiency detected in the past and lab currently asymptomatic clinically. Pt. states bladder is hurting at the end of urination for a few seconds. Ongoing about the same for several months. Never feel like it is completely empty. Can dribble for a while.  History [name] has a past medical history of IBS (irritable bowel syndrome); Hypertension; Hyperlipidemia; GERD (gastroesophageal reflux  disease); Depression; Murmur, heart; Chronic pericarditis; CVA (cerebral infarction) (05/2003); Seizure disorder; TIA (transient ischemic attack); Anxiety; Chronic urinary tract infection; Cerebellar degeneration; Allergic rhinitis; Arthritis; Cataract; Thyroid disease; Seizures; Hypothyroidism; Burning tongue syndrome (25 years); Complication of anesthesia; Stroke (5 years ago); Gastric polyps; Personal history of arterial venous malformation (AVM); Sternum fx (10/27/2013); High cholesterol; Ataxia; Gait disorder; Bradycardia; Hypotension; Obesity; and Hypertension.   She has past surgical history that includes Knee arthroscopy (Right, 11/14/2006); tibial and fibular internal fixation (Left); Upper gastrointestinal endoscopy (2009, 2013); Colonoscopy (2006, 2009); Appendectomy (81 years old); cyst removed (35 years ago); Total abdominal hysterectomy (81 years old); Knee arthroscopy (Bilateral, 5 and 6 years ago); and Knee arthroscopy with lateral menisectomy (07/03/2012).   Her family history includes Coronary artery disease in her brother; Diabetes in her brother; Heart attack (age of onset: 31) in her father; Prostate cancer in her brother and son.She reports that she quit smoking about 39 years ago. Her smoking use included Cigarettes. She has a 2.5 pack-year smoking history. She has never used smokeless tobacco. She reports that she drinks alcohol. She reports that she does not use illicit drugs.  Current Outpatient Prescriptions on File Prior to Visit  Medication Sig Dispense Refill  . vitamin B-12 (CYANOCOBALAMIN) 1000 MCG tablet Take 1,000 mcg by mouth daily.    . Vitamin D, Ergocalciferol, (DRISDOL) 50000 UNITS CAPS capsule Take 50,000 Units by mouth every Wednesday.     No current facility-administered medications on file prior to visit.    ROS Review of Systems  Constitutional: Negative for fever, chills, diaphoresis, appetite change, fatigue and unexpected weight change.  HENT: Negative for  congestion, ear pain, hearing loss, postnasal drip, rhinorrhea,  sneezing, sore throat and trouble swallowing.   Eyes: Negative for pain.  Respiratory: Negative for cough, chest tightness and shortness of breath.   Cardiovascular: Negative for chest pain and palpitations.  Gastrointestinal: Negative for nausea, vomiting, abdominal pain, diarrhea and constipation.  Genitourinary: Negative for dysuria, frequency and menstrual problem.  Musculoskeletal: Negative for joint swelling and arthralgias.  Skin: Negative for rash.  Neurological: Negative for dizziness, weakness, numbness and headaches.  Psychiatric/Behavioral: Negative for dysphoric mood and agitation.    Objective:  BP 114/56 mmHg  Pulse 95  Temp(Src) 97 F (36.1 C) (Oral)  Ht 5' 3.5" (1.613 m)  Wt 228 lb 3.2 oz (103.511 kg)  BMI 39.78 kg/m2  BP Readings from Last 3 Encounters:  10/12/14 114/56  06/02/14 118/70  05/25/14 121/61    Wt Readings from Last 3 Encounters:  10/12/14 228 lb 3.2 oz (103.511 kg)  06/02/14 228 lb (103.42 kg)  05/25/14 225 lb (102.059 kg)     Physical Exam  Constitutional: She is oriented to person, place, and time. She appears well-developed and well-nourished. No distress.  HENT:  Head: Normocephalic and atraumatic.  Right Ear: External ear normal.  Left Ear: External ear normal.  Nose: Nose normal.  Mouth/Throat: Oropharynx is clear and moist.  Eyes: Conjunctivae and EOM are normal. Pupils are equal, round, and reactive to light.  Neck: Normal range of motion. Neck supple. No thyromegaly present.  Cardiovascular: Normal rate, regular rhythm and normal heart sounds.   No murmur heard. Pulmonary/Chest: Effort normal and breath sounds normal. No respiratory distress. She has no wheezes. She has no rales.  Abdominal: Soft. Bowel sounds are normal. She exhibits no distension. There is no tenderness.  Lymphadenopathy:    She has no cervical adenopathy.  Neurological: She is alert and oriented  to person, place, and time. She has normal reflexes.  Skin: Skin is warm and dry.  Psychiatric: She has a normal mood and affect. Her behavior is normal. Judgment and thought content normal.    No results found for: HGBA1C  Lab Results  Component Value Date   WBC 6.1 05/07/2014   HGB 12.4 05/07/2014   HCT 38.5 05/07/2014   PLT 216 05/07/2014   GLUCOSE 106* 05/07/2014   CHOL 166 05/08/2014   TRIG 114 05/08/2014   HDL 56 05/08/2014   LDLCALC 87 05/08/2014   ALT 16 03/03/2014   AST 21 03/03/2014   NA 136* 05/07/2014   K 4.7 05/07/2014   CL 98 05/07/2014   CREATININE 0.81 05/07/2014   BUN 12 05/07/2014   CO2 26 05/07/2014   TSH 4.760* 05/08/2014    Ct Chest W Contrast  05/28/2014   CLINICAL DATA:  Followup pulmonary nodule and prior sternal fracture  EXAM: CT CHEST WITH CONTRAST  TECHNIQUE: Multidetector CT imaging of the chest was performed during intravenous contrast administration.  CONTRAST:  37mL OMNIPAQUE IOHEXOL 300 MG/ML  SOLN  COMPARISON:  05/07/2014, 12/30/2013, 10/27/2013  FINDINGS: The lungs are well aerated bilaterally. No focal infiltrate or sizable effusion is seen. There is again noted a right middle lobe nodule. It measures 12 x 8 mm in greatest dimension and is stable from the prior exam. The previously reported measurements were with regard to the short axis but the lesion is stable in appearance. No other parenchymal nodules are seen.  The thoracic inlet again demonstrates a hypodense lesion within the left lobe of the thyroid. The thoracic aorta shows calcification without aneurysmal dilatation. The pulmonary artery is incompletely evaluated. Heavy  coronary calcifications are seen. No hilar or mediastinal adenopathy is noted.  Scanning into the upper abdomen reveals no acute abnormality. No acute bony abnormality is noted.  IMPRESSION: Stable nodule on the right middle lobe. Given its stability at 7 months a follow-up CT is recommended in 1 year to assess for  stability. If the lesion is stable at that time, no further follow-up is necessary.  Stable changes in the left lobe of the thyroid. Again nonemergent ultrasound of thyroid is recommended for further evaluation.  No new focal abnormality is seen.   Electronically Signed   By: Inez Catalina M.D.   On: 05/28/2014 14:18    Assessment & Plan:   Kinsley was seen today for hypertension, hyperlipidemia, hypothyroidism, gastrophageal reflux and vit d deficiency.  Diagnoses and all orders for this visit:  Essential hypertension Orders: -     amLODipine (NORVASC) 5 MG tablet; Take 1 tablet (5 mg total) by mouth daily. -     POCT CBC -     CMP14+EGFR  Hyperlipemia Orders: -     NMR, lipoprofile  Thyroid disease Orders: -     Thyroid Panel With TSH  Other specified hypothyroidism Orders: -     Thyroid Panel With TSH  Vitamin D deficiency Orders: -     Vit D  25 hydroxy (rtn osteoporosis monitoring)  Dysuria Orders: -     POCT UA - Microscopic Only -     POCT urinalysis dipstick -     Urine culture  Other orders -     amitriptyline (ELAVIL) 50 MG tablet; Take 0.5 tablets (25 mg total) by mouth at bedtime. -     escitalopram (LEXAPRO) 20 MG tablet; Take 1 tablet (20 mg total) by mouth at bedtime. -     gabapentin (NEURONTIN) 300 MG capsule; Take 1 capsule (300 mg total) by mouth 3 (three) times daily. -     levothyroxine (SYNTHROID, LEVOTHROID) 50 MCG tablet; Take 1 tablet (50 mcg total) by mouth daily before breakfast. -     lisinopril (PRINIVIL,ZESTRIL) 20 MG tablet; Take 1 tablet (20 mg total) by mouth at bedtime. -     lovastatin (MEVACOR) 40 MG tablet; Take 1 tablet (40 mg total) by mouth at bedtime. -     omeprazole (PRILOSEC) 40 MG capsule; Take 1 capsule (40 mg total) by mouth daily.   I have changed Ms. Marcoe's amitriptyline, escitalopram, gabapentin, levothyroxine, lisinopril, lovastatin, and omeprazole. I am also having her maintain her vitamin B-12, Vitamin D  (Ergocalciferol), and amLODipine.  Meds ordered this encounter  Medications  . amitriptyline (ELAVIL) 50 MG tablet    Sig: Take 0.5 tablets (25 mg total) by mouth at bedtime.    Dispense:  90 tablet    Refill:  3  . amLODipine (NORVASC) 5 MG tablet    Sig: Take 1 tablet (5 mg total) by mouth daily.    Dispense:  90 tablet    Refill:  3  . escitalopram (LEXAPRO) 20 MG tablet    Sig: Take 1 tablet (20 mg total) by mouth at bedtime.    Dispense:  90 tablet    Refill:  3  . gabapentin (NEURONTIN) 300 MG capsule    Sig: Take 1 capsule (300 mg total) by mouth 3 (three) times daily.    Dispense:  270 capsule    Refill:  3  . levothyroxine (SYNTHROID, LEVOTHROID) 50 MCG tablet    Sig: Take 1 tablet (50 mcg total) by mouth  daily before breakfast.    Dispense:  90 tablet    Refill:  3  . lisinopril (PRINIVIL,ZESTRIL) 20 MG tablet    Sig: Take 1 tablet (20 mg total) by mouth at bedtime.    Dispense:  90 tablet    Refill:  3  . lovastatin (MEVACOR) 40 MG tablet    Sig: Take 1 tablet (40 mg total) by mouth at bedtime.    Dispense:  90 tablet    Refill:  3  . omeprazole (PRILOSEC) 40 MG capsule    Sig: Take 1 capsule (40 mg total) by mouth daily.    Dispense:  90 capsule    Refill:  3     Follow-up: Return in about 6 months (around 04/14/2015).  [name], M.D.

## 2014-10-13 LAB — CMP14+EGFR
ALK PHOS: 67 IU/L (ref 39–117)
ALT: 15 IU/L (ref 0–32)
AST: 19 IU/L (ref 0–40)
Albumin/Globulin Ratio: 1.6 (ref 1.1–2.5)
Albumin: 4.5 g/dL (ref 3.5–4.8)
BILIRUBIN TOTAL: 0.4 mg/dL (ref 0.0–1.2)
BUN/Creatinine Ratio: 10 — ABNORMAL LOW (ref 11–26)
BUN: 9 mg/dL (ref 8–27)
CALCIUM: 9.9 mg/dL (ref 8.7–10.3)
CHLORIDE: 100 mmol/L (ref 97–108)
CO2: 24 mmol/L (ref 18–29)
Creatinine, Ser: 0.87 mg/dL (ref 0.57–1.00)
GFR calc Af Amer: 74 mL/min/{1.73_m2} (ref >59)
GFR, EST NON AFRICAN AMERICAN: 64 mL/min/{1.73_m2} (ref >59)
GLUCOSE: 103 mg/dL — AB (ref 65–99)
Globulin, Total: 2.9 g/dL (ref 1.5–4.5)
POTASSIUM: 4.4 mmol/L (ref 3.5–5.2)
Sodium: 139 mmol/L (ref 134–144)
Total Protein: 7.4 g/dL (ref 6.0–8.5)

## 2014-10-13 LAB — THYROID PANEL WITH TSH
FREE THYROXINE INDEX: 2.2 (ref 1.2–4.9)
T3 Uptake Ratio: 22 % — ABNORMAL LOW (ref 24–39)
T4, Total: 9.9 ug/dL (ref 4.5–12.0)
TSH: 4.06 u[IU]/mL (ref 0.450–4.500)

## 2014-10-13 LAB — VITAMIN D 25 HYDROXY (VIT D DEFICIENCY, FRACTURES): VIT D 25 HYDROXY: 35.2 ng/mL (ref 30.0–100.0)

## 2014-10-14 ENCOUNTER — Telehealth: Payer: Self-pay | Admitting: *Deleted

## 2014-10-14 ENCOUNTER — Other Ambulatory Visit: Payer: Self-pay | Admitting: Family Medicine

## 2014-10-14 LAB — URINE CULTURE

## 2014-10-14 LAB — NMR, LIPOPROFILE
Cholesterol: 172 mg/dL (ref 100–199)
HDL Cholesterol by NMR: 59 mg/dL (ref >39)
HDL Particle Number: 31.5 umol/L (ref >=30.5)
LDL Particle Number: 906 nmol/L (ref <1000)
LDL SIZE: 21.3 nm (ref >20.5)
LDL-C: 87 mg/dL (ref 0–99)
LP-IR Score: 40 (ref <=45)
SMALL LDL PARTICLE NUMBER: 186 nmol/L (ref <=527)
Triglycerides by NMR: 131 mg/dL (ref 0–149)

## 2014-10-14 MED ORDER — LEVOTHYROXINE SODIUM 75 MCG PO TABS: 75.0000 ug | ORAL_TABLET | Freq: Every day | ORAL | Status: DC

## 2014-10-14 NOTE — Telephone Encounter (Signed)
Please call to discuss lab esults.

## 2014-10-14 NOTE — Telephone Encounter (Signed)
Pt aware of lab results 

## 2014-10-14 NOTE — Telephone Encounter (Signed)
-----   Message from [name], MD sent at 10/14/2014 10:25 AM EDT ----- [name], Your thyroid dose is still slightly low. I have sent in a prescription for a higher dose for you to try. It should help you with energy. Additionally your thyroid panel should be rechecked in 2 months. Drop by for blood work a couple of days prior to your appointment and we can discuss the results when you come in to see me. Best Regards, [name], M.D.

## 2014-10-15 ENCOUNTER — Other Ambulatory Visit: Payer: Self-pay | Admitting: Family Medicine

## 2014-10-15 MED ORDER — CIPROFLOXACIN HCL 500 MG PO TABS: 500.0000 mg | ORAL_TABLET | Freq: Two times a day (BID) | ORAL | Status: DC

## 2014-10-18 ENCOUNTER — Other Ambulatory Visit: Payer: Self-pay | Admitting: *Deleted

## 2014-10-18 ENCOUNTER — Telehealth: Payer: Self-pay | Admitting: Family Medicine

## 2014-10-18 MED ORDER — CIPROFLOXACIN HCL 500 MG PO TABS: 500.0000 mg | ORAL_TABLET | Freq: Two times a day (BID) | ORAL | Status: DC

## 2014-10-18 NOTE — Telephone Encounter (Signed)
Returning [place]. Call. Call her back at [phone]

## 2014-10-18 NOTE — Telephone Encounter (Signed)
Pt aware of labs & abx for UTI

## 2014-10-21 ENCOUNTER — Telehealth: Payer: Self-pay | Admitting: Family Medicine

## 2014-10-21 MED ORDER — NITROFURANTOIN MONOHYD MACRO 100 MG PO CAPS: 100.0000 mg | ORAL_CAPSULE | Freq: Two times a day (BID) | ORAL | Status: DC

## 2014-10-21 NOTE — Telephone Encounter (Signed)
Patient states that Cipro is making her feel bad. She is feeling a little dizzy and her BP and pulse have been up a little. Advised patient to not take any more antibiotic and we would see if provider wanted to change to another med. Also advised patient that if she became SOB or started having CP to get to the nearest ER. Patient verbalized understanding. Please advise on med change

## 2014-10-21 NOTE — Telephone Encounter (Signed)
REplacement sent. Please tell pt. WS

## 2014-10-23 NOTE — Telephone Encounter (Signed)
Pt aware and has already started med

## 2014-11-10 ENCOUNTER — Encounter: Payer: Self-pay | Admitting: Cardiovascular Disease

## 2014-11-10 ENCOUNTER — Ambulatory Visit (INDEPENDENT_AMBULATORY_CARE_PROVIDER_SITE_OTHER): Payer: Medicare Other | Admitting: Cardiovascular Disease

## 2014-11-10 VITALS — BP 134/82 | HR 77 | Ht 63.5 in | Wt 229.8 lb

## 2014-11-10 DIAGNOSIS — E785 Hyperlipidemia, unspecified: Secondary | ICD-10-CM | POA: Diagnosis not present

## 2014-11-10 DIAGNOSIS — R42 Dizziness and giddiness: Secondary | ICD-10-CM | POA: Diagnosis not present

## 2014-11-10 DIAGNOSIS — R002 Palpitations: Secondary | ICD-10-CM | POA: Diagnosis not present

## 2014-11-10 DIAGNOSIS — F329 Major depressive disorder, single episode, unspecified: Secondary | ICD-10-CM

## 2014-11-10 DIAGNOSIS — I10 Essential (primary) hypertension: Secondary | ICD-10-CM

## 2014-11-10 DIAGNOSIS — F32A Depression, unspecified: Secondary | ICD-10-CM

## 2014-11-10 NOTE — Assessment & Plan Note (Signed)
History of hypertension blood pressure measured today at 134/82. She is on amlodipine and lisinopril. Continue current meds at current dosing.

## 2014-11-10 NOTE — Assessment & Plan Note (Signed)
Patient complains of palpitations, bradycardia and weakness. I'm going to get a 30 day event monitor to further evaluate

## 2014-11-10 NOTE — Assessment & Plan Note (Signed)
History of hyperlipidemia on lovastatin with recent lipid profile performed 10/12/14 revealed a total cholesterol 172.

## 2014-11-10 NOTE — Assessment & Plan Note (Signed)
Patient complains of palpitations with some hypotension and bradycardia. I'm going to get a 30 day event monitor and reevaluate

## 2014-11-10 NOTE — Progress Notes (Signed)
11/10/2014 [name]   03/07/1934  [id number]  Primary Physician [name], MD Primary Cardiologist: Lorretta Harp MD Renae Gloss   HPI:   [name] is a 81 year old moderately overweight married Caucasian female mother of 52 (son 54 years old died 18/2 years ago prostate CA), grandmother of 2 grandchildren, who was formally a patient of Dr. Georgiann Mccoy. She has no prior cardiac history. I last saw her in the office 05/24/14. She does have a history of treated hypertension and hyperlipidemia as well as a family history of heart disease. Her father died at age 85 from a heart attack as did her brother. She has another brother who is in open-heart surgery. She has never had a heart attack but has had "mini strokes" in the past. She does get dyspnea on exertion but denies chest pain. I left cell her one year ago. She apparently had Ace sternal fracture secondary to a fall 6 months ago and had a chest CT that showed abnormalities in her lung parenchyma which has been followed by serial CT scans. She was recently admitted to, hospital for chest pain rule out MI. CT angiogram was negative for PE. A Myoview stress test was negative as well. She complains of episodes of weakness as well as palpitations. She does have blood pressure and heart rate readings with some relative bradycardia.   Current Outpatient Prescriptions  Medication Sig Dispense Refill  . amitriptyline (ELAVIL) 50 MG tablet Take 0.5 tablets (25 mg total) by mouth at bedtime. 90 tablet 3  . amLODipine (NORVASC) 5 MG tablet Take 1 tablet (5 mg total) by mouth daily. 90 tablet 3  . escitalopram (LEXAPRO) 20 MG tablet Take 1 tablet (20 mg total) by mouth at bedtime. 90 tablet 3  . gabapentin (NEURONTIN) 300 MG capsule Take 1 capsule (300 mg total) by mouth 3 (three) times daily. 270 capsule 3  . levothyroxine (SYNTHROID, LEVOTHROID) 75 MCG tablet Take 1 tablet (75 mcg total) by mouth daily before breakfast.  30 tablet 2  . lisinopril (PRINIVIL,ZESTRIL) 20 MG tablet Take 1 tablet (20 mg total) by mouth at bedtime. 90 tablet 3  . lovastatin (MEVACOR) 40 MG tablet Take 1 tablet (40 mg total) by mouth at bedtime. 90 tablet 3  . nitrofurantoin, macrocrystal-monohydrate, (MACROBID) 100 MG capsule Take 1 capsule (100 mg total) by mouth 2 (two) times daily. 14 capsule 0  . omeprazole (PRILOSEC) 40 MG capsule Take 1 capsule (40 mg total) by mouth daily. 90 capsule 3  . vitamin B-12 (CYANOCOBALAMIN) 1000 MCG tablet Take 1,000 mcg by mouth daily.    . Vitamin D, Ergocalciferol, (DRISDOL) 50000 UNITS CAPS capsule Take 50,000 Units by mouth every Wednesday.     No current facility-administered medications for this visit.    No Known Allergies  History   Social History  . Marital Status: Married    Spouse Name: N/A  . Number of Children: N/A  . Years of Education: N/A   Occupational History  . Not on file.   Social History Main Topics  . Smoking status: Former Smoker -- 0.25 packs/day for 10 years    Types: Cigarettes    Quit date: 07/31/1975  . Smokeless tobacco: Never Used  . Alcohol Use: Yes     Comment: 2 glasses a year  . Drug Use: No  . Sexual Activity: Not on file   Other Topics Concern  . Not on file   Social History Narrative     Review  of Systems: General: negative for chills, fever, night sweats or weight changes.  Cardiovascular: negative for chest pain, dyspnea on exertion, edema, orthopnea, palpitations, paroxysmal nocturnal dyspnea or shortness of breath Dermatological: negative for rash Respiratory: negative for cough or wheezing Urologic: negative for hematuria Abdominal: negative for nausea, vomiting, diarrhea, bright red blood per rectum, melena, or hematemesis Neurologic: negative for visual changes, syncope, or dizziness All other systems reviewed and are otherwise negative except as noted above.    Blood pressure 134/82, pulse 77, height 5' 3.5" (1.613 m),  weight 229 lb 12.8 oz (104.237 kg).  General appearance: alert and no distress Neck: no adenopathy, no carotid bruit, no JVD, supple, symmetrical, trachea midline and thyroid not enlarged, symmetric, no tenderness/mass/nodules Lungs: clear to auscultation bilaterally Heart: regular rate and rhythm, S1, S2 normal, no murmur, click, rub or gallop Extremities: extremities normal, atraumatic, no cyanosis or edema  EKG normal sinus rhythm at 77 without ST or T-wave changes. I personally reviewed this EKG  ASSESSMENT AND PLAN:   Palpitation Patient complains of palpitations, bradycardia and weakness. I'm going to get a 30 day event monitor to further evaluate   Hyperlipemia History of hyperlipidemia on lovastatin with recent lipid profile performed 10/12/14 revealed a total cholesterol 172.   Essential hypertension History of hypertension blood pressure measured today at 134/82. She is on amlodipine and lisinopril. Continue current meds at current dosing.   Palpitations Patient complains of palpitations with some hypotension and bradycardia. I'm going to get a 30 day event monitor and reevaluate       Lorretta Harp MD [facility], [facility] 11/10/2014 4:19 PM

## 2014-11-10 NOTE — Patient Instructions (Signed)
We will see you back in follow up in 3 months with an extender and 6 months with Dr Gwenlyn Found.   Dr Gwenlyn Found has ordered: Your Doctor has ordered you to wear a heart monitor. You will wear this for 30 days.   TIPS -  REMINDERS 1. The sensor is the lanyard that is worn around your neck every day - this is powered by a battery that needs to be changed every day 2. The monitor is the device that allows you to record symptoms - this will need to be charged daily 3. The sensor & monitor need to be within 100 feet of each other at all times 4. The sensor connects to the electrodes (stickers) - these should be changed every 24-48 hours (you do not have to remove them when you bathe, just make sure they are dry when you connect it back to the sensor 5. If you need more supplies (electrodes, batteries), please call the 1-800 # on the back of the pamphlet and [employer] will mail you more supplies 6. If your skin becomes sensitive, please try the sample pack of sensitive skin electrodes (the white packet in your silver box) and call [employer] to have them mail you more of these type of electrodes 7. When you are finish wearing the monitor, please place all supplies back in the silver box, place the silver box in the pre-packaged UPS bag and drop off at [employer] or call them so they can come pick it up   Cardiac Event Monitoring A cardiac event monitor is a small recording device used to help detect abnormal heart rhythms (arrhythmias). The monitor is used to record heart rhythm when noticeable symptoms such as the following occur:  Fast heartbeats (palpitations), such as heart racing or fluttering.  Dizziness.  Fainting or light-headedness.  Unexplained weakness. The monitor is wired to two electrodes placed on your chest. Electrodes are flat, sticky disks that attach to your skin. The monitor can be worn for up to 30 days. You will wear the monitor at all times, except when bathing.  HOW TO USE YOUR CARDIAC  EVENT MONITOR A technician will prepare your chest for the electrode placement. The technician will show you how to place the electrodes, how to work the monitor, and how to replace the batteries. Take time to practice using the monitor before you leave the office. Make sure you understand how to send the information from the monitor to your health care provider. This requires a telephone with a landline, not a cell phone. You need to:  Wear your monitor at all times, except when you are in water:  Do not get the monitor wet.  Take the monitor off when bathing. Do not swim or use a hot tub with it on.  Keep your skin clean. Do not put body lotion or moisturizer on your chest.  Change the electrodes daily or any time they stop sticking to your skin. You might need to use tape to keep them on.  It is possible that your skin under the electrodes could become irritated. To keep this from happening, try to put the electrodes in slightly different places on your chest. However, they must remain in the area under your left breast and in the upper right section of your chest.  Make sure the monitor is safely clipped to your clothing or in a location close to your body that your health care provider recommends.  Press the button to record when you feel  symptoms of heart trouble, such as dizziness, weakness, light-headedness, palpitations, thumping, shortness of breath, unexplained weakness, or a fluttering or racing heart. The monitor is always on and records what happened slightly before you pressed the button, so do not worry about being too late to get good information.  Keep a diary of your activities, such as walking, doing chores, and taking medicine. It is especially important to note what you were doing when you pushed the button to record your symptoms. This will help your health care provider determine what might be contributing to your symptoms. The information stored in your monitor will be  reviewed by your health care provider alongside your diary entries.  Send the recorded information as recommended by your health care provider. It is important to understand that it will take some time for your health care provider to process the results.  Change the batteries as recommended by your health care provider. SEEK IMMEDIATE MEDICAL CARE IF:   You have chest pain.  You have extreme difficulty breathing or shortness of breath.  You develop a very fast heartbeat that persists.  You develop dizziness that does not go away.  You faint or constantly feel you are about to faint. Document Released: 04/24/2008 Document Revised: 11/30/2013 Document Reviewed: 01/12/2013 [facility] Patient Information 2015 [place], [place]. This information is not intended to replace advice given to you by your health care provider. Make sure you discuss any questions you have with your health care provider.

## 2014-11-17 ENCOUNTER — Other Ambulatory Visit: Payer: Self-pay

## 2014-11-17 MED ORDER — VITAMIN D (ERGOCALCIFEROL) 1.25 MG (50000 UNIT) PO CAPS: 50000.0000 [IU] | ORAL_CAPSULE | ORAL | Status: DC

## 2014-11-17 NOTE — Telephone Encounter (Signed)
Last seen 10/12/14 Dr [name]  Last Vit D 3/15  35.2  This is for mail order

## 2014-11-23 ENCOUNTER — Telehealth: Payer: Self-pay | Admitting: Family Medicine

## 2014-11-23 NOTE — Telephone Encounter (Signed)
Appointment given for 2:55 4/27 with [facility].

## 2014-11-24 ENCOUNTER — Ambulatory Visit (INDEPENDENT_AMBULATORY_CARE_PROVIDER_SITE_OTHER): Payer: Medicare Other | Admitting: Family Medicine

## 2014-11-24 ENCOUNTER — Encounter: Payer: Self-pay | Admitting: Family Medicine

## 2014-11-24 VITALS — BP 129/68 | HR 85 | Temp 96.9°F | Ht 63.5 in | Wt 231.8 lb

## 2014-11-24 DIAGNOSIS — R0600 Dyspnea, unspecified: Secondary | ICD-10-CM | POA: Diagnosis not present

## 2014-11-24 DIAGNOSIS — I10 Essential (primary) hypertension: Secondary | ICD-10-CM | POA: Diagnosis not present

## 2014-11-24 DIAGNOSIS — E079 Disorder of thyroid, unspecified: Secondary | ICD-10-CM | POA: Diagnosis not present

## 2014-11-24 DIAGNOSIS — R609 Edema, unspecified: Secondary | ICD-10-CM

## 2014-11-24 LAB — POCT CBC
Granulocyte percent: 54.7 %G (ref 37–80)
HCT, POC: 42.6 % (ref 37.7–47.9)
Hemoglobin: 13 g/dL (ref 12.2–16.2)
LYMPH, POC: 2.6 (ref 0.6–3.4)
MCH, POC: 28.1 pg (ref 27–31.2)
MCHC: 30.5 g/dL — AB (ref 31.8–35.4)
MCV: 92 fL (ref 80–97)
MPV: 6.6 fL (ref 0–99.8)
PLATELET COUNT, POC: 247 10*3/uL (ref 142–424)
POC Granulocyte: 4 (ref 2–6.9)
POC LYMPH PERCENT: 35.3 %L (ref 10–50)
RBC: 4.63 M/uL (ref 4.04–5.48)
RDW, POC: 13.8 %
WBC: 7.3 10*3/uL (ref 4.6–10.2)

## 2014-11-24 MED ORDER — FUROSEMIDE 40 MG PO TABS: 40.0000 mg | ORAL_TABLET | Freq: Every day | ORAL | Status: DC

## 2014-11-24 NOTE — Progress Notes (Addendum)
Subjective:  Patient ID: [name], female    DOB: [birth year]  Age: 81 y.o. MRN: [id number]  CC: Edema   HPI [name] presents for increased swelling in the feet and ankles over the last month. She's  had some exercise intolerance for a long time but that is worsened as well. She is primarily tired but she does have some shortness of breath with exertion as well. She feels tired when she just walks across the room. She does continue to urinate normally. She has no skin discoloration that she's noted. She does have history of hypertension and a heart murmur indicating risk for CHF.  History [name] has a past medical history of IBS (irritable bowel syndrome); Hypertension; Hyperlipidemia; GERD (gastroesophageal reflux disease); Depression; Murmur, heart; Chronic pericarditis; CVA (cerebral infarction) (05/2003); Seizure disorder; TIA (transient ischemic attack); Anxiety; Chronic urinary tract infection; Cerebellar degeneration; Allergic rhinitis; Arthritis; Cataract; Thyroid disease; Seizures; Hypothyroidism; Burning tongue syndrome (25 years); Complication of anesthesia; Stroke (5 years ago); Gastric polyps; Personal history of arterial venous malformation (AVM); Sternum fx (10/27/2013); High cholesterol; Ataxia; Gait disorder; Bradycardia; Hypotension; Obesity; Hypertension; and Palpitations.   She has past surgical history that includes Knee arthroscopy (Right, 11/14/2006); tibial and fibular internal fixation (Left); Upper gastrointestinal endoscopy (2009, 2013); Colonoscopy (2006, 2009); Appendectomy (81 years old); cyst removed (35 years ago); Total abdominal hysterectomy (81 years old); Knee arthroscopy (Bilateral, 5 and 6 years ago); and Knee arthroscopy with lateral menisectomy (07/03/2012).   Her family history includes Coronary artery disease in her brother; Diabetes in her brother; Heart attack (age of onset: 47) in her father; Prostate cancer in her brother and son.She reports that  she quit smoking about 39 years ago. Her smoking use included Cigarettes. She has a 2.5 pack-year smoking history. She has never used smokeless tobacco. She reports that she drinks alcohol. She reports that she does not use illicit drugs.  Current Outpatient Prescriptions on File Prior to Visit  Medication Sig Dispense Refill  . amitriptyline (ELAVIL) 50 MG tablet Take 0.5 tablets (25 mg total) by mouth at bedtime. 90 tablet 3  . escitalopram (LEXAPRO) 20 MG tablet Take 1 tablet (20 mg total) by mouth at bedtime. 90 tablet 3  . gabapentin (NEURONTIN) 300 MG capsule Take 1 capsule (300 mg total) by mouth 3 (three) times daily. 270 capsule 3  . levothyroxine (SYNTHROID, LEVOTHROID) 75 MCG tablet Take 1 tablet (75 mcg total) by mouth daily before breakfast. 30 tablet 2  . lisinopril (PRINIVIL,ZESTRIL) 20 MG tablet Take 1 tablet (20 mg total) by mouth at bedtime. 90 tablet 3  . lovastatin (MEVACOR) 40 MG tablet Take 1 tablet (40 mg total) by mouth at bedtime. 90 tablet 3  . omeprazole (PRILOSEC) 40 MG capsule Take 1 capsule (40 mg total) by mouth daily. 90 capsule 3  . vitamin B-12 (CYANOCOBALAMIN) 1000 MCG tablet Take 1,000 mcg by mouth daily.    . Vitamin D, Ergocalciferol, (DRISDOL) 50000 UNITS CAPS capsule Take 1 capsule (50,000 Units total) by mouth every Wednesday. 16 capsule 0   No current facility-administered medications on file prior to visit.    ROS Review of Systems  Constitutional: Negative for fever, chills, diaphoresis, appetite change, fatigue and unexpected weight change.  HENT: Negative for congestion, ear pain, hearing loss, postnasal drip, rhinorrhea, sneezing, sore throat and trouble swallowing.   Eyes: Negative for pain.  Respiratory: Negative for cough, chest tightness and shortness of breath.   Cardiovascular: Negative for chest pain and palpitations.  Gastrointestinal:  Negative for nausea, vomiting, abdominal pain, diarrhea and constipation.  Genitourinary: Negative for  dysuria, frequency and menstrual problem.  Musculoskeletal: Positive for joint swelling (Ankles bilaterally). Negative for arthralgias.  Skin: Negative for rash.  Neurological: Negative for dizziness, weakness, numbness and headaches.  Psychiatric/Behavioral: Negative for dysphoric mood and agitation.    Objective:  BP 129/68 mmHg  Pulse 85  Temp(Src) 96.9 F (36.1 C) (Oral)  Ht 5' 3.5" (1.613 m)  Wt 231 lb 12.8 oz (105.144 kg)  BMI 40.41 kg/m2  BP Readings from Last 3 Encounters:  11/24/14 129/68  11/10/14 134/82  10/12/14 114/56    Wt Readings from Last 3 Encounters:  11/24/14 231 lb 12.8 oz (105.144 kg)  11/10/14 229 lb 12.8 oz (104.237 kg)  10/12/14 228 lb 3.2 oz (103.511 kg)     Physical Exam  Constitutional: She is oriented to person, place, and time. She appears well-developed and well-nourished. No distress.  HENT:  Head: Normocephalic and atraumatic.  Right Ear: External ear normal.  Left Ear: External ear normal.  Nose: Nose normal.  Mouth/Throat: Oropharynx is clear and moist.  Eyes: Conjunctivae and EOM are normal. Pupils are equal, round, and reactive to light.  Neck: Normal range of motion. Neck supple. No thyromegaly present.  Cardiovascular: Normal rate, regular rhythm and normal heart sounds.   No murmur heard. Pulmonary/Chest: Effort normal and breath sounds normal. No respiratory distress. She has no wheezes. She has no rales.  Abdominal: Soft. Bowel sounds are normal. She exhibits no distension. There is no tenderness.  Musculoskeletal: She exhibits edema (3+ pretibial equal bilaterally).  Lymphadenopathy:    She has no cervical adenopathy.  Neurological: She is alert and oriented to person, place, and time. She has normal reflexes.  Skin: Skin is warm and dry.  Psychiatric: She has a normal mood and affect. Her behavior is normal. Judgment and thought content normal.    No results found for: HGBA1C  Lab Results  Component Value Date   WBC  6.4 10/12/2014   HGB 13.8 10/12/2014   HCT 46.1 10/12/2014   PLT 216 05/07/2014   GLUCOSE 103* 10/12/2014   CHOL 172 10/12/2014   TRIG 131 10/12/2014   HDL 59 10/12/2014   LDLCALC 87 05/08/2014   ALT 15 10/12/2014   AST 19 10/12/2014   NA 139 10/12/2014   K 4.4 10/12/2014   CL 100 10/12/2014   CREATININE 0.87 10/12/2014   BUN 9 10/12/2014   CO2 24 10/12/2014   TSH 4.060 10/12/2014    Ct Chest W Contrast  05/28/2014   CLINICAL DATA:  Followup pulmonary nodule and prior sternal fracture  EXAM: CT CHEST WITH CONTRAST  TECHNIQUE: Multidetector CT imaging of the chest was performed during intravenous contrast administration.  CONTRAST:  42mL OMNIPAQUE IOHEXOL 300 MG/ML  SOLN  COMPARISON:  05/07/2014, 12/30/2013, 10/27/2013  FINDINGS: The lungs are well aerated bilaterally. No focal infiltrate or sizable effusion is seen. There is again noted a right middle lobe nodule. It measures 12 x 8 mm in greatest dimension and is stable from the prior exam. The previously reported measurements were with regard to the short axis but the lesion is stable in appearance. No other parenchymal nodules are seen.  The thoracic inlet again demonstrates a hypodense lesion within the left lobe of the thyroid. The thoracic aorta shows calcification without aneurysmal dilatation. The pulmonary artery is incompletely evaluated. Heavy coronary calcifications are seen. No hilar or mediastinal adenopathy is noted.  Scanning into the upper abdomen  reveals no acute abnormality. No acute bony abnormality is noted.  IMPRESSION: Stable nodule on the right middle lobe. Given its stability at 7 months a follow-up CT is recommended in 1 year to assess for stability. If the lesion is stable at that time, no further follow-up is necessary.  Stable changes in the left lobe of the thyroid. Again nonemergent ultrasound of thyroid is recommended for further evaluation.  No new focal abnormality is seen.   Electronically Signed   By: Inez Catalina M.D.   On: 05/28/2014 14:18    Assessment & Plan:   Peola was seen today for edema.  Diagnoses and all orders for this visit:  Edema Orders: -     POCT CBC -     Brain natriuretic peptide -     CMP14+EGFR -     BMP8+EGFR; Future -     furosemide (LASIX) 40 MG tablet; Take 1 tablet (40 mg total) by mouth daily with breakfast.  Essential hypertension  Thyroid disease Orders: -     TSH -     T4, free -     CMP14+EGFR  Dyspnea Orders: -     Brain natriuretic peptide  I have discontinued [name]'s amLODipine and nitrofurantoin (macrocrystal-monohydrate). I am also having her start on furosemide. Additionally, I am having her maintain her vitamin B-12, amitriptyline, escitalopram, gabapentin, lisinopril, lovastatin, omeprazole, levothyroxine, and Vitamin D (Ergocalciferol).  Meds ordered this encounter  Medications  . furosemide (LASIX) 40 MG tablet    Sig: Take 1 tablet (40 mg total) by mouth daily with breakfast.    Dispense:  30 tablet    Refill:  3     Follow-up: Return in about 1 month (around 12/24/2014) for hypertension.  [name], M.D.

## 2014-11-25 ENCOUNTER — Telehealth: Payer: Self-pay | Admitting: Family Medicine

## 2014-11-25 DIAGNOSIS — R609 Edema, unspecified: Secondary | ICD-10-CM

## 2014-11-25 MED ORDER — FUROSEMIDE 40 MG PO TABS: 40.0000 mg | ORAL_TABLET | Freq: Every day | ORAL | Status: DC

## 2014-11-25 NOTE — Telephone Encounter (Signed)
done

## 2014-11-26 LAB — CMP14+EGFR
ALT: 9 IU/L (ref 0–32)
AST: 14 IU/L (ref 0–40)
Albumin/Globulin Ratio: 1.8 (ref 1.1–2.5)
Albumin: 4.4 g/dL (ref 3.5–4.8)
Alkaline Phosphatase: 67 IU/L (ref 39–117)
BUN/Creatinine Ratio: 13 (ref 11–26)
BUN: 12 mg/dL (ref 8–27)
Bilirubin Total: 0.3 mg/dL (ref 0.0–1.2)
CO2: 23 mmol/L (ref 18–29)
Calcium: 9.6 mg/dL (ref 8.7–10.3)
Chloride: 101 mmol/L (ref 97–108)
Creatinine, Ser: 0.95 mg/dL (ref 0.57–1.00)
GFR, EST AFRICAN AMERICAN: 66 mL/min/{1.73_m2} (ref >59)
GFR, EST NON AFRICAN AMERICAN: 58 mL/min/{1.73_m2} — AB (ref >59)
GLOBULIN, TOTAL: 2.4 g/dL (ref 1.5–4.5)
Glucose: 94 mg/dL (ref 65–99)
Potassium: 4.7 mmol/L (ref 3.5–5.2)
SODIUM: 139 mmol/L (ref 134–144)
TOTAL PROTEIN: 6.8 g/dL (ref 6.0–8.5)

## 2014-11-26 LAB — BRAIN NATRIURETIC PEPTIDE: BNP: 56.1 pg/mL (ref 0.0–100.0)

## 2014-11-26 LAB — T4, FREE: Free T4: 1.49 ng/dL (ref 0.82–1.77)

## 2014-11-26 LAB — TSH: TSH: 1.12 u[IU]/mL (ref 0.450–4.500)

## 2014-11-29 ENCOUNTER — Other Ambulatory Visit: Payer: Self-pay

## 2014-11-29 DIAGNOSIS — R609 Edema, unspecified: Secondary | ICD-10-CM

## 2014-12-02 ENCOUNTER — Ambulatory Visit (INDEPENDENT_AMBULATORY_CARE_PROVIDER_SITE_OTHER): Payer: Medicare Other | Admitting: Family Medicine

## 2014-12-02 ENCOUNTER — Encounter: Payer: Self-pay | Admitting: Family Medicine

## 2014-12-02 VITALS — BP 121/67 | HR 82 | Temp 96.9°F | Ht 63.5 in | Wt 229.6 lb

## 2014-12-02 DIAGNOSIS — N3090 Cystitis, unspecified without hematuria: Secondary | ICD-10-CM

## 2014-12-02 DIAGNOSIS — R300 Dysuria: Secondary | ICD-10-CM | POA: Diagnosis not present

## 2014-12-02 LAB — POCT URINALYSIS DIPSTICK
BILIRUBIN UA: NEGATIVE
GLUCOSE UA: NEGATIVE
Ketones, UA: NEGATIVE
NITRITE UA: NEGATIVE
Protein, UA: NEGATIVE
SPEC GRAV UA: 1.015
Urobilinogen, UA: NEGATIVE
pH, UA: 6

## 2014-12-02 MED ORDER — NITROFURANTOIN MONOHYD MACRO 100 MG PO CAPS: 100.0000 mg | ORAL_CAPSULE | Freq: Two times a day (BID) | ORAL | Status: DC

## 2014-12-02 NOTE — Addendum Note (Signed)
Addended by: [name] on: 12/02/2014 04:24 PM   Modules accepted: Orders, SmartSet

## 2014-12-02 NOTE — Progress Notes (Addendum)
Subjective:  Patient ID: [name], female    DOB: 04-25-1934  Age: 81 y.o. MRN: [id number]  CC: Urinary Tract Infection   HPI [name] presents for frequency of urination. Feels like she has to go even after she's just finished feeling like she's not done. Additionally she is having some pressure and pain in the suprapubic region. Onset was 3 days ago. No fever chills or sweats. No nausea vomiting or diarrhea. No back pain.  History Desarae has a past medical history of IBS (irritable bowel syndrome); Hypertension; Hyperlipidemia; GERD (gastroesophageal reflux disease); Depression; Murmur, heart; Chronic pericarditis; CVA (cerebral infarction) (05/2003); Seizure disorder; TIA (transient ischemic attack); Anxiety; Chronic urinary tract infection; Cerebellar degeneration; Allergic rhinitis; Arthritis; Cataract; Thyroid disease; Seizures; Hypothyroidism; Burning tongue syndrome (25 years); Complication of anesthesia; Stroke (5 years ago); Gastric polyps; Personal history of arterial venous malformation (AVM); Sternum fx (10/27/2013); High cholesterol; Ataxia; Gait disorder; Bradycardia; Hypotension; Obesity; Hypertension; and Palpitations.   She has past surgical history that includes Knee arthroscopy (Right, 11/14/2006); tibial and fibular internal fixation (Left); Upper gastrointestinal endoscopy (2009, 2013); Colonoscopy (2006, 2009); Appendectomy (81 years old); cyst removed (35 years ago); Total abdominal hysterectomy (81 years old); Knee arthroscopy (Bilateral, 5 and 6 years ago); and Knee arthroscopy with lateral menisectomy (07/03/2012).   Her family history includes Coronary artery disease in her brother; Diabetes in her brother; Heart attack (age of onset: 81) in her father; Prostate cancer in her brother and son.She reports that she quit smoking about 39 years ago. Her smoking use included Cigarettes. She has a 2.5 pack-year smoking history. She has never used smokeless tobacco. She  reports that she drinks alcohol. She reports that she does not use illicit drugs.  Current Outpatient Prescriptions on File Prior to Visit  Medication Sig Dispense Refill  . amitriptyline (ELAVIL) 50 MG tablet Take 0.5 tablets (25 mg total) by mouth at bedtime. 90 tablet 3  . escitalopram (LEXAPRO) 20 MG tablet Take 1 tablet (20 mg total) by mouth at bedtime. 90 tablet 3  . furosemide (LASIX) 40 MG tablet Take 1 tablet (40 mg total) by mouth daily with breakfast. 90 tablet 3  . gabapentin (NEURONTIN) 300 MG capsule Take 1 capsule (300 mg total) by mouth 3 (three) times daily. 270 capsule 3  . levothyroxine (SYNTHROID, LEVOTHROID) 75 MCG tablet Take 1 tablet (75 mcg total) by mouth daily before breakfast. 30 tablet 2  . lisinopril (PRINIVIL,ZESTRIL) 20 MG tablet Take 1 tablet (20 mg total) by mouth at bedtime. 90 tablet 3  . lovastatin (MEVACOR) 40 MG tablet Take 1 tablet (40 mg total) by mouth at bedtime. 90 tablet 3  . omeprazole (PRILOSEC) 40 MG capsule Take 1 capsule (40 mg total) by mouth daily. 90 capsule 3  . vitamin B-12 (CYANOCOBALAMIN) 1000 MCG tablet Take 1,000 mcg by mouth daily.    . Vitamin D, Ergocalciferol, (DRISDOL) 50000 UNITS CAPS capsule Take 1 capsule (50,000 Units total) by mouth every Wednesday. 16 capsule 0   No current facility-administered medications on file prior to visit.    ROS Review of Systems  Constitutional: Negative for fever, chills and diaphoresis.  HENT: Negative for congestion.   Eyes: Negative for visual disturbance.  Respiratory: Negative for cough and shortness of breath.   Cardiovascular: Negative for chest pain and palpitations.  Gastrointestinal: Negative for nausea, diarrhea and constipation.  Genitourinary: Positive for dysuria, urgency and frequency. Negative for hematuria, flank pain, decreased urine volume, menstrual problem and pelvic pain.  Musculoskeletal:  Negative for joint swelling and arthralgias.  Skin: Negative for rash.    Neurological: Negative for dizziness and numbness.    Objective:  BP 121/67 mmHg  Pulse 82  Temp(Src) 96.9 F (36.1 C) (Oral)  Ht 5' 3.5" (1.613 m)  Wt 229 lb 9.6 oz (104.146 kg)  BMI 40.03 kg/m2  Physical Exam  Constitutional: She is oriented to person, place, and time. She appears well-developed and well-nourished. No distress.  HENT:  Head: Normocephalic and atraumatic.  Eyes: Pupils are equal, round, and reactive to light.  Neck: Normal range of motion.  Cardiovascular: Normal rate, regular rhythm and normal heart sounds.   No murmur heard. Pulmonary/Chest: Effort normal. No respiratory distress. She has no wheezes.  Abdominal: Soft. Bowel sounds are normal. She exhibits no distension. There is no tenderness.  Neurological: She is alert and oriented to person, place, and time. She has normal reflexes.  Skin: Skin is warm and dry.  Psychiatric: She has a normal mood and affect. Her behavior is normal. Judgment and thought content normal.    Assessment & Plan:   Maevis was seen today for urinary tract infection.  Diagnoses and all orders for this visit:  Dysuria Orders: -     Cancel: POCT UA - Microscopic Only -     POCT urinalysis dipstick -     Urine culture  Cystitis  Other orders -     nitrofurantoin, macrocrystal-monohydrate, (MACROBID) 100 MG capsule; Take 1 capsule (100 mg total) by mouth 2 (two) times daily.   I am having Ms. Swatzell start on nitrofurantoin (macrocrystal-monohydrate). I am also having her maintain her vitamin B-12, amitriptyline, escitalopram, gabapentin, lisinopril, lovastatin, omeprazole, levothyroxine, Vitamin D (Ergocalciferol), and furosemide.  Meds ordered this encounter  Medications  . nitrofurantoin, macrocrystal-monohydrate, (MACROBID) 100 MG capsule    Sig: Take 1 capsule (100 mg total) by mouth 2 (two) times daily.    Dispense:  14 capsule    Refill:  0     Follow-up: Return in about 1 month (around 01/02/2015).  [name], M.D.

## 2014-12-02 NOTE — Addendum Note (Signed)
Addended by: Marin Olp on: 12/02/2014 04:23 PM   Modules accepted: Miquel Dunn

## 2014-12-04 LAB — URINE CULTURE

## 2014-12-06 ENCOUNTER — Other Ambulatory Visit: Payer: Self-pay | Admitting: Family Medicine

## 2014-12-06 MED ORDER — AMOXICILLIN-POT CLAVULANATE 875-125 MG PO TABS: 1.0000 | ORAL_TABLET | Freq: Two times a day (BID) | ORAL | Status: DC

## 2014-12-14 ENCOUNTER — Other Ambulatory Visit: Payer: Medicare Other

## 2014-12-14 NOTE — Progress Notes (Signed)
No labs needed today

## 2014-12-15 ENCOUNTER — Other Ambulatory Visit: Payer: Self-pay

## 2014-12-15 ENCOUNTER — Encounter (INDEPENDENT_AMBULATORY_CARE_PROVIDER_SITE_OTHER): Payer: Medicare Other

## 2014-12-15 DIAGNOSIS — R002 Palpitations: Secondary | ICD-10-CM

## 2014-12-15 NOTE — Progress Notes (Signed)
error 

## 2014-12-22 ENCOUNTER — Telehealth: Payer: Self-pay | Admitting: Cardiovascular Disease

## 2014-12-22 NOTE — Telephone Encounter (Signed)
Informed patient results are not available as of yet   Will contact her with results

## 2014-12-22 NOTE — Telephone Encounter (Signed)
Patient states she had monitor placed 2 weeks ago and has not heard the results.

## 2014-12-29 ENCOUNTER — Ambulatory Visit (INDEPENDENT_AMBULATORY_CARE_PROVIDER_SITE_OTHER): Payer: Medicare Other | Admitting: Cardiovascular Disease

## 2014-12-29 ENCOUNTER — Encounter: Payer: Self-pay | Admitting: Cardiovascular Disease

## 2014-12-29 VITALS — BP 104/68 | HR 70 | Ht 63.5 in | Wt 227.0 lb

## 2014-12-29 DIAGNOSIS — R001 Bradycardia, unspecified: Secondary | ICD-10-CM

## 2014-12-29 NOTE — Progress Notes (Signed)
12/29/2014 [name]   1934/07/13  [id number]  Primary Physician Rebekah Fraise, MD Primary Cardiologist: Rebekah Harp MD [name]   HPI:    [name] is a 81 year old moderately overweight married Caucasian female mother of 6 (son 56 years old died 44/2 years ago prostate CA), grandmother of 2 grandchildren, who was formally a patient of [name]. She has no prior cardiac history. I last saw her in the office 11/10/14. She does have a history of treated hypertension and hyperlipidemia as well as a family history of heart disease. Her father died at age 28 from a heart attack as did her brother. She has another brother who is in open-heart surgery. She has never had a heart attack but has had "mini strokes" in the past. She does get dyspnea on exertion but denies chest pain. I left cell her one year ago. She apparently had Ace sternal fracture secondary to a fall 6 months ago and had a chest CT that showed abnormalities in her lung parenchyma which has been followed by serial CT scans. She was recently admitted to, hospital for chest pain rule out MI. CT angiogram was negative for PE. A Myoview stress test was negative as well. She complains of episodes of weakness as well as palpitations. She does have blood pressure and heart rate readings with some relative bradycardia. Because of palpitations and weakness and performed a 30 day event monitor which showed predominantly sinus rhythm with episodes of sinus bradycardia, pauses up to 2 seconds and a short run of paroxysmal fibrillation with a rapid ventricular response. She is not a goodq chelation Because of a Right Cheek AVM As Well As Frequent Falls and Imbalance.   Current Outpatient Prescriptions  Medication Sig Dispense Refill  . amitriptyline (ELAVIL) 50 MG tablet Take 0.5 tablets (25 mg total) by mouth at bedtime. 90 tablet 3  . amoxicillin-clavulanate (AUGMENTIN) 875-125 MG per tablet Take 1 tablet by  mouth 2 (two) times daily. Take all of this medication 20 tablet 0  . escitalopram (LEXAPRO) 20 MG tablet Take 1 tablet (20 mg total) by mouth at bedtime. 90 tablet 3  . furosemide (LASIX) 40 MG tablet Take 1 tablet (40 mg total) by mouth daily with breakfast. 90 tablet 3  . gabapentin (NEURONTIN) 300 MG capsule Take 1 capsule (300 mg total) by mouth 3 (three) times daily. 270 capsule 3  . levothyroxine (SYNTHROID, LEVOTHROID) 75 MCG tablet Take 1 tablet (75 mcg total) by mouth daily before breakfast. 30 tablet 2  . lisinopril (PRINIVIL,ZESTRIL) 20 MG tablet Take 1 tablet (20 mg total) by mouth at bedtime. 90 tablet 3  . lovastatin (MEVACOR) 40 MG tablet Take 1 tablet (40 mg total) by mouth at bedtime. 90 tablet 3  . nitrofurantoin, macrocrystal-monohydrate, (MACROBID) 100 MG capsule Take 1 capsule (100 mg total) by mouth 2 (two) times daily. 14 capsule 0  . omeprazole (PRILOSEC) 40 MG capsule Take 1 capsule (40 mg total) by mouth daily. 90 capsule 3  . vitamin B-12 (CYANOCOBALAMIN) 1000 MCG tablet Take 1,000 mcg by mouth daily.    . Vitamin D, Ergocalciferol, (DRISDOL) 50000 UNITS CAPS capsule Take 1 capsule (50,000 Units total) by mouth every Wednesday. 16 capsule 0   No current facility-administered medications for this visit.    No Known Allergies  History   Social History  . Marital Status: Married    Spouse Name: N/A  . Number of Children: N/A  . Years of Education: N/A  Occupational History  . Not on file.   Social History Main Topics  . Smoking status: Former Smoker -- 0.25 packs/day for 10 years    Types: Cigarettes    Quit date: 07/31/1975  . Smokeless tobacco: Never Used  . Alcohol Use: Yes     Comment: 2 glasses a year  . Drug Use: No  . Sexual Activity: Not on file   Other Topics Concern  . Not on file   Social History Narrative     Review of Systems: General: negative for chills, fever, night sweats or weight changes.  Cardiovascular: negative for chest  pain, dyspnea on exertion, edema, orthopnea, palpitations, paroxysmal nocturnal dyspnea or shortness of breath Dermatological: negative for rash Respiratory: negative for cough or wheezing Urologic: negative for hematuria Abdominal: negative for nausea, vomiting, diarrhea, bright red blood per rectum, melena, or hematemesis Neurologic: negative for visual changes, syncope, or dizziness All other systems reviewed and are otherwise negative except as noted above.    Blood pressure 104/68, pulse 70, height 5' 3.5" (1.613 m), weight 227 lb (102.967 kg).  General appearance: alert and no distress Neck: no adenopathy, no carotid bruit, no JVD, supple, symmetrical, trachea midline and thyroid not enlarged, symmetric, no tenderness/mass/nodules Lungs: clear to auscultation bilaterally Heart: regular rate and rhythm, S1, S2 normal, no murmur, click, rub or gallop Extremities: extremities normal, atraumatic, no cyanosis or edema  EKG not performed today  ASSESSMENT AND PLAN:   Palpitations [name] returns today for follow-up of her 30 day event monitor placed because of palpitations and weakness. This showed predominant sinus rhythm with episodes of sinus bradycardia, 2 second pauses and short run of paroxysmal mitral fibrillation. Apparently she is not a good oral adequate hydration, because of a AVM in her right cheek as well as imbalance and frequent falls due to cerebellar degeneration. I'm referring her to our elective physiologist to review her event monitor and make further recommendations.       Rebekah Harp MD FACP,FACC,FAHA, [facility] 12/29/2014 11:05 AM

## 2014-12-29 NOTE — Assessment & Plan Note (Signed)
[name] returns today for follow-up of her 30 day event monitor placed because of palpitations and weakness. This showed predominant sinus rhythm with episodes of sinus bradycardia, 2 second pauses and short run of paroxysmal mitral fibrillation. Apparently she is not a good oral adequate hydration, because of a AVM in her right cheek as well as imbalance and frequent falls due to cerebellar degeneration. I'm referring her to our elective physiologist to review her event monitor and make further recommendations.

## 2014-12-29 NOTE — Patient Instructions (Signed)
Medication Instructions:   No changes in medications  Labwork:  N/A  Testing/Procedures:  Referral to Electrophysiology at our [facility] office   Follow-Up:  In 6 months with Dr Gwenlyn Found  Any Other Special Instructions Will Be Listed Below (If Applicable).

## 2015-01-03 ENCOUNTER — Ambulatory Visit: Payer: Medicare Other | Admitting: Family Medicine

## 2015-01-03 ENCOUNTER — Encounter: Payer: Self-pay | Admitting: Internal Medicine

## 2015-01-03 ENCOUNTER — Ambulatory Visit (INDEPENDENT_AMBULATORY_CARE_PROVIDER_SITE_OTHER): Payer: Medicare Other | Admitting: Internal Medicine

## 2015-01-03 VITALS — BP 120/70 | HR 64 | Ht 63.5 in | Wt 228.0 lb

## 2015-01-03 DIAGNOSIS — R001 Bradycardia, unspecified: Secondary | ICD-10-CM | POA: Insufficient documentation

## 2015-01-03 DIAGNOSIS — R5383 Other fatigue: Secondary | ICD-10-CM | POA: Diagnosis not present

## 2015-01-03 DIAGNOSIS — I10 Essential (primary) hypertension: Secondary | ICD-10-CM

## 2015-01-03 DIAGNOSIS — R002 Palpitations: Secondary | ICD-10-CM

## 2015-01-03 DIAGNOSIS — I480 Paroxysmal atrial fibrillation: Secondary | ICD-10-CM | POA: Diagnosis not present

## 2015-01-03 DIAGNOSIS — R531 Weakness: Secondary | ICD-10-CM | POA: Insufficient documentation

## 2015-01-03 HISTORY — DX: Other fatigue: R53.83

## 2015-01-03 NOTE — Progress Notes (Signed)
Electrophysiology Office Note   Date:  01/03/2015   ID:  [name], DOB 1934/05/25, MRN [id number]  PCP:  [name], MD  Cardiologist:  Dr Gwenlyn Found Primary Electrophysiologist: Thompson Grayer, MD    Chief Complaint  Patient presents with  . Bradycardia     History of Present Illness: [name] is a 81 y.o. female who presents today for electrophysiology evaluation.   She has a longstanding history of multiple chronic medical issues.  She is not active.  Her primary concern is with progressive cerebellar disease and unsteadiness from this.  She has some fatigue.  She does not go to bed until very late and then sleeps until about 11am.  She is tired upon waking and her energy is poor.  She has periods where she feels that she may collapse.  She has not had frank syncope.  She has "good days and bad days".  She has occasional tachypalpitations.  She has chronic edema.  Today, she denies symptoms of chest pain, shortness of breath, orthopnea, PND, claudication,  bleeding, or neurologic sequela. The patient is tolerating medications without difficulties and is otherwise without complaint today.    Past Medical History  Diagnosis Date  . IBS (irritable bowel syndrome)   . Hypertension   . Hyperlipidemia   . GERD (gastroesophageal reflux disease)   . Depression   . Chronic pericarditis   . CVA (cerebral infarction) 05/2003  . Seizure disorder   . TIA (transient ischemic attack)   . Anxiety   . Chronic urinary tract infection   . Cerebellar degeneration   . Allergic rhinitis     PT. DENIES  . Arthritis     NECK  . Cataract   . Thyroid disease   . Seizures   . Hypothyroidism   . Burning tongue syndrome 25 years  . Complication of anesthesia     low o2 sats, coded 30 years ago  . Stroke 5 years ago    Right side of face weak, slurred speach  . Gastric polyps   . Personal history of arterial venous malformation (AVM)     right side of face  . Sternum fx 10/27/2013  .  High cholesterol   . Ataxia   . Gait disorder   . Bradycardia     primarily nocturnal  . Hypotension   . Obesity   . Hypertension   . Paroxysmal atrial fibrillation     chads2vasc score is 6,  she is felt to be a poor candidate for anticoagulation   Past Surgical History  Procedure Laterality Date  . Knee arthroscopy Right 11/14/2006  . Tibial and fibular internal fixation Left   . Upper gastrointestinal endoscopy  2009, 2013  . Colonoscopy  2006, 2009  . Appendectomy  81 years old  . Cyst removed  35 years ago  . Total abdominal hysterectomy  81 years old  . Knee arthroscopy Bilateral 5 and 6 years ago  . Knee arthroscopy with lateral menisectomy  07/03/2012    Procedure: KNEE ARTHROSCOPY WITH LATERAL MENISECTOMY;  Surgeon: Magnus Sinning, MD;  Location: [facility];  Service: Orthopedics;  Laterality: Left;  with Partial Lateral Menisectomy and Medial Menisectomy. Shaving of medial and lateral femoral condyles. Shaving of patella. Removal of a loose body     Current Outpatient Prescriptions  Medication Sig Dispense Refill  . amitriptyline (ELAVIL) 50 MG tablet Take 0.5 tablets (25 mg total) by mouth at bedtime. 90 tablet 3  . amoxicillin-clavulanate (AUGMENTIN) 875-125 MG per  tablet Take 1 tablet by mouth 2 (two) times daily. Take all of this medication 20 tablet 0  . escitalopram (LEXAPRO) 20 MG tablet Take 1 tablet (20 mg total) by mouth at bedtime. 90 tablet 3  . furosemide (LASIX) 40 MG tablet Take 1 tablet (40 mg total) by mouth daily with breakfast. 90 tablet 3  . gabapentin (NEURONTIN) 300 MG capsule Take 1 capsule (300 mg total) by mouth 3 (three) times daily. 270 capsule 3  . levothyroxine (SYNTHROID, LEVOTHROID) 75 MCG tablet Take 1 tablet (75 mcg total) by mouth daily before breakfast. 30 tablet 2  . lisinopril (PRINIVIL,ZESTRIL) 20 MG tablet Take 1 tablet (20 mg total) by mouth at bedtime. 90 tablet 3  . lovastatin (MEVACOR) 40 MG tablet Take 1 tablet (40 mg total) by  mouth at bedtime. 90 tablet 3  . nitrofurantoin, macrocrystal-monohydrate, (MACROBID) 100 MG capsule Take 1 capsule (100 mg total) by mouth 2 (two) times daily. 14 capsule 0  . omeprazole (PRILOSEC) 40 MG capsule Take 1 capsule (40 mg total) by mouth daily. 90 capsule 3  . vitamin B-12 (CYANOCOBALAMIN) 1000 MCG tablet Take 1,000 mcg by mouth daily.    . Vitamin D, Ergocalciferol, (DRISDOL) 50000 UNITS CAPS capsule Take 1 capsule (50,000 Units total) by mouth every Wednesday. 16 capsule 0   No current facility-administered medications for this visit.    Allergies:   Review of patient's allergies indicates no known allergies.   Social History:  The patient  reports that she quit smoking about 39 years ago. Her smoking use included Cigarettes. She has a 2.5 pack-year smoking history. She has never used smokeless tobacco. She reports that she drinks alcohol. She reports that she does not use illicit drugs.   Family History:  The patient's  family history includes Coronary artery disease in her brother; Diabetes in her brother; Heart attack (age of onset: 2) in her father; Prostate cancer in her brother and son.    ROS:  Please see the history of present illness.   All other systems are reviewed and negative.    PHYSICAL EXAM: VS:  BP 120/70 mmHg  Pulse 64  Ht 5' 3.5" (1.613 m)  Wt 103.42 kg (228 lb)  BMI 39.75 kg/m2 , BMI Body mass index is 39.75 kg/(m^2). GEN: morbidly obese and chronically ill, in a wheelchair today, in no acute distress HEENT: normal Neck: no JVD, carotid bruits, or masses Cardiac: RRR; no murmurs, rubs, or gallops,+ edema  Respiratory:  clear to auscultation bilaterally, normal work of breathing GI: soft, nontender, nondistended, + BS MS: diffuse atrophy Skin: warm and dry  Neuro:  In a wheelchair Psych: euthymic mood, full affect  EKG:  EKG is reviewed from 12/29/14 and reveals sinus rhythm at 70 bpm, normal ekg  Recent Labs: 05/07/2014: Platelets  216 11/24/2014: ALT 9; B Natriuretic Peptide 56.1; BUN 12; Creatinine 0.95; Hemoglobin 13.0; Potassium 4.7; Sodium 139; TSH 1.120    Lipid Panel     Component Value Date/Time   CHOL 172 10/12/2014 1507   CHOL 185 09/02/2013 1204   TRIG 131 10/12/2014 1507   TRIG 114 05/08/2014 0410   HDL 59 10/12/2014 1507   HDL 56 05/08/2014 0410   HDL 59 09/02/2013 1204   CHOLHDL 3.0 05/08/2014 0410   CHOLHDL 3.1 09/02/2013 1204   VLDL 23 05/08/2014 0410   LDLCALC 87 05/08/2014 0410   LDLCALC 100* 09/02/2013 1204     Wt Readings from Last 3 Encounters:  01/03/15 103.42 kg (  228 lb)  12/29/14 102.967 kg (227 lb)  12/02/14 104.146 kg (229 lb 9.6 oz)      Other studies Reviewed: Additional studies/ records that were reviewed today include: Dr Rachel Bo notes, event monitor is also reviewed at length  Review of the above records today demonstrates: noctural bradycardia is observed with short pauses, no daytime events,  A fib with mild elevation in V rates is noted also   ASSESSMENT AND PLAN:  1. Sinus bradycardia On her recent event monitor, most episodes of sinus bradycardia occur before 9 am (she sleeps until 11 am).  I am not convinced that she has daytime bradycardia or that it contributes to her symptoms.  She has multiple chronic disease states which could explain her symptoms.  She seems convinced that bradycardia is her issue and has been told by Dr Rollene Fare "for years" that she may benefit from pacing.  She says that the reason that she did not have daytime bradycardia while wearing her monitor is that she had "only good days". I have encouraged her to have a sleep study to evaluate her nocturnal pauses. She may have sleep apnea which is the cause for this as well as her daytime fatigue.  She is willing to consider a sleep study at this time. In addition, I have offered implantable loop recorder placement to better characterize her symptoms of palpitations and presyncope.  If she is  documented to have symptomatic bradycardia then we could more strongly consider pacing at that time.  2. Paroxysmal atrial fibrillation Weight loss, exercise, and evaluation for sleep apnea are recommended.  Lifestyle modification would be very helpful in managing her afib. Will proceed with implantable loop recorder placement as above for afib management No changes today chads2vasc score is 6 though she is felt to be a poor candidate for anticoagulation.  Could consider left atrial appendage occlusion device once this is available.  3. HTN Stable No change required today  4. Obesity Weight loss Sleep study  Very complicated patient with multiple ongoing and interacting medical issues.  A high level of decision making was required for this patient.  Current medicines are reviewed at length with the patient today.   The patient does not have concerns regarding her medicines.  The following changes were made today:  none  Follow-up:return 4 weeks after ILR implant for further evaluation  Signed, Thompson Grayer, MD  01/03/2015 9:55 PM     [address] [place] [address] [phone] (office) [phone] (fax)

## 2015-01-03 NOTE — Patient Instructions (Addendum)
Medication Instructions:  Your physician recommends that you continue on your current medications as directed. Please refer to the Current Medication list given to you today.   Labwork: None ordered  Testing/Procedures: Your physician has recommended that you have a sleep study. This test records several body functions during sleep, including: brain activity, eye movement, oxygen and carbon dioxide blood levels, heart rate and rhythm, breathing rate and rhythm, the flow of air through your mouth and nose, snoring, body muscle movements, and chest and belly movement.---home study    Follow-Up: Your physician recommends that you schedule a follow-up appointment in: 7-10 days from 01/06/15 in device clinic for wound check and 4 weeks withDr Allred   Any Other Special Instructions Will Be Listed Below (If Applicable).  Please be at the [place] at [facility] at 6:00am on [place] for [facility]

## 2015-01-04 ENCOUNTER — Other Ambulatory Visit: Payer: Self-pay

## 2015-01-04 MED ORDER — LEVOTHYROXINE SODIUM 75 MCG PO TABS: 75.0000 ug | ORAL_TABLET | Freq: Every day | ORAL | Status: DC

## 2015-01-06 ENCOUNTER — Ambulatory Visit (HOSPITAL_COMMUNITY)
Admission: RE | Admit: 2015-01-06 | Discharge: 2015-01-06 | Disposition: A | Discharge status: Home / Self Care | Payer: Medicare Other | Source: Ambulatory Visit | Attending: Internal Medicine | Admitting: Internal Medicine

## 2015-01-06 ENCOUNTER — Encounter (HOSPITAL_COMMUNITY): Payer: Self-pay | Admitting: Internal Medicine

## 2015-01-06 ENCOUNTER — Encounter (HOSPITAL_COMMUNITY)
Admission: RE | Disposition: A | Discharge status: Home / Self Care | Payer: Self-pay | Source: Ambulatory Visit | Attending: Internal Medicine

## 2015-01-06 DIAGNOSIS — Z6839 Body mass index (BMI) 39.0-39.9, adult: Secondary | ICD-10-CM | POA: Diagnosis not present

## 2015-01-06 DIAGNOSIS — I4891 Unspecified atrial fibrillation: Secondary | ICD-10-CM | POA: Diagnosis not present

## 2015-01-06 DIAGNOSIS — I69328 Other speech and language deficits following cerebral infarction: Secondary | ICD-10-CM | POA: Insufficient documentation

## 2015-01-06 DIAGNOSIS — I69392 Facial weakness following cerebral infarction: Secondary | ICD-10-CM | POA: Insufficient documentation

## 2015-01-06 DIAGNOSIS — I480 Paroxysmal atrial fibrillation: Secondary | ICD-10-CM | POA: Insufficient documentation

## 2015-01-06 DIAGNOSIS — K589 Irritable bowel syndrome without diarrhea: Secondary | ICD-10-CM | POA: Diagnosis not present

## 2015-01-06 DIAGNOSIS — R55 Syncope and collapse: Secondary | ICD-10-CM | POA: Diagnosis present

## 2015-01-06 DIAGNOSIS — Z87440 Personal history of urinary (tract) infections: Secondary | ICD-10-CM | POA: Insufficient documentation

## 2015-01-06 DIAGNOSIS — E669 Obesity, unspecified: Secondary | ICD-10-CM | POA: Insufficient documentation

## 2015-01-06 DIAGNOSIS — K219 Gastro-esophageal reflux disease without esophagitis: Secondary | ICD-10-CM | POA: Insufficient documentation

## 2015-01-06 DIAGNOSIS — G319 Degenerative disease of nervous system, unspecified: Secondary | ICD-10-CM | POA: Insufficient documentation

## 2015-01-06 DIAGNOSIS — G40909 Epilepsy, unspecified, not intractable, without status epilepticus: Secondary | ICD-10-CM | POA: Insufficient documentation

## 2015-01-06 DIAGNOSIS — F329 Major depressive disorder, single episode, unspecified: Secondary | ICD-10-CM | POA: Insufficient documentation

## 2015-01-06 DIAGNOSIS — K317 Polyp of stomach and duodenum: Secondary | ICD-10-CM | POA: Diagnosis not present

## 2015-01-06 DIAGNOSIS — E780 Pure hypercholesterolemia: Secondary | ICD-10-CM | POA: Diagnosis not present

## 2015-01-06 DIAGNOSIS — I10 Essential (primary) hypertension: Secondary | ICD-10-CM | POA: Insufficient documentation

## 2015-01-06 DIAGNOSIS — E039 Hypothyroidism, unspecified: Secondary | ICD-10-CM | POA: Insufficient documentation

## 2015-01-06 DIAGNOSIS — Z87891 Personal history of nicotine dependence: Secondary | ICD-10-CM | POA: Insufficient documentation

## 2015-01-06 DIAGNOSIS — R002 Palpitations: Secondary | ICD-10-CM | POA: Diagnosis present

## 2015-01-06 HISTORY — PX: EP IMPLANTABLE DEVICE: SHX172B

## 2015-01-06 SURGERY — LOOP RECORDER INSERTION
Anesthesia: LOCAL

## 2015-01-06 MED ORDER — LIDOCAINE-EPINEPHRINE 1 %-1:100000 IJ SOLN
INTRAMUSCULAR | Status: DC | PRN
  Administered 2015-01-06: 10 mL

## 2015-01-06 MED ORDER — LIDOCAINE-EPINEPHRINE 1 %-1:100000 IJ SOLN
INTRAMUSCULAR | Status: AC
  Filled 2015-01-06: qty 1

## 2015-01-06 SURGICAL SUPPLY — 2 items
LOOP REVEAL LINQSYS (Prosthesis & Implant Heart) ×2 IMPLANT
PACK LOOP INSERTION (CUSTOM PROCEDURE TRAY) ×2 IMPLANT

## 2015-01-06 NOTE — Interval H&P Note (Signed)
History and Physical Interval Note:  01/06/2015 7:27 AM  [name]  has presented today for surgery, with the diagnosis of pre-syncope  The various methods of treatment have been discussed with the patient and family. After consideration of risks, benefits and other options for treatment, the patient has consented to  Procedure(s): Loop Recorder Insertion (N/A) as a surgical intervention .  The patient's history has been reviewed, patient examined, no change in status, stable for surgery.  I have reviewed the patient's chart and labs.  Questions were answered to the patient's satisfaction.     Thompson Grayer

## 2015-01-06 NOTE — H&P (View-Only) (Signed)
Electrophysiology Office Note   Date:  01/03/2015   ID:  [name], DOB 20-Jan-1934, MRN [id number]  PCP:  [name], MD  Cardiologist:  Dr Gwenlyn Found Primary Electrophysiologist: Thompson Grayer, MD    Chief Complaint  Patient presents with  . Bradycardia     History of Present Illness: [name] is a 81 y.o. female who presents today for electrophysiology evaluation.   She has a longstanding history of multiple chronic medical issues.  She is not active.  Her primary concern is with progressive cerebellar disease and unsteadiness from this.  She has some fatigue.  She does not go to bed until very late and then sleeps until about 11am.  She is tired upon waking and her energy is poor.  She has periods where she feels that she may collapse.  She has not had frank syncope.  She has "good days and bad days".  She has occasional tachypalpitations.  She has chronic edema.  Today, she denies symptoms of chest pain, shortness of breath, orthopnea, PND, claudication,  bleeding, or neurologic sequela. The patient is tolerating medications without difficulties and is otherwise without complaint today.    Past Medical History  Diagnosis Date  . IBS (irritable bowel syndrome)   . Hypertension   . Hyperlipidemia   . GERD (gastroesophageal reflux disease)   . Depression   . Chronic pericarditis   . CVA (cerebral infarction) 05/2003  . Seizure disorder   . TIA (transient ischemic attack)   . Anxiety   . Chronic urinary tract infection   . Cerebellar degeneration   . Allergic rhinitis     PT. DENIES  . Arthritis     NECK  . Cataract   . Thyroid disease   . Seizures   . Hypothyroidism   . Burning tongue syndrome 25 years  . Complication of anesthesia     low o2 sats, coded 30 years ago  . Stroke 5 years ago    Right side of face weak, slurred speach  . Gastric polyps   . Personal history of arterial venous malformation (AVM)     right side of face  . Sternum fx 10/27/2013  .  High cholesterol   . Ataxia   . Gait disorder   . Bradycardia     primarily nocturnal  . Hypotension   . Obesity   . Hypertension   . Paroxysmal atrial fibrillation     chads2vasc score is 6,  she is felt to be a poor candidate for anticoagulation   Past Surgical History  Procedure Laterality Date  . Knee arthroscopy Right 11/14/2006  . Tibial and fibular internal fixation Left   . Upper gastrointestinal endoscopy  2009, 2013  . Colonoscopy  2006, 2009  . Appendectomy  81 years old  . Cyst removed  35 years ago  . Total abdominal hysterectomy  81 years old  . Knee arthroscopy Bilateral 5 and 6 years ago  . Knee arthroscopy with lateral menisectomy  07/03/2012    Procedure: KNEE ARTHROSCOPY WITH LATERAL MENISECTOMY;  Surgeon: Magnus Sinning, MD;  Location: [facility];  Service: Orthopedics;  Laterality: Left;  with Partial Lateral Menisectomy and Medial Menisectomy. Shaving of medial and lateral femoral condyles. Shaving of patella. Removal of a loose body     Current Outpatient Prescriptions  Medication Sig Dispense Refill  . amitriptyline (ELAVIL) 50 MG tablet Take 0.5 tablets (25 mg total) by mouth at bedtime. 90 tablet 3  . amoxicillin-clavulanate (AUGMENTIN) 875-125 MG per  tablet Take 1 tablet by mouth 2 (two) times daily. Take all of this medication 20 tablet 0  . escitalopram (LEXAPRO) 20 MG tablet Take 1 tablet (20 mg total) by mouth at bedtime. 90 tablet 3  . furosemide (LASIX) 40 MG tablet Take 1 tablet (40 mg total) by mouth daily with breakfast. 90 tablet 3  . gabapentin (NEURONTIN) 300 MG capsule Take 1 capsule (300 mg total) by mouth 3 (three) times daily. 270 capsule 3  . levothyroxine (SYNTHROID, LEVOTHROID) 75 MCG tablet Take 1 tablet (75 mcg total) by mouth daily before breakfast. 30 tablet 2  . lisinopril (PRINIVIL,ZESTRIL) 20 MG tablet Take 1 tablet (20 mg total) by mouth at bedtime. 90 tablet 3  . lovastatin (MEVACOR) 40 MG tablet Take 1 tablet (40 mg total) by  mouth at bedtime. 90 tablet 3  . nitrofurantoin, macrocrystal-monohydrate, (MACROBID) 100 MG capsule Take 1 capsule (100 mg total) by mouth 2 (two) times daily. 14 capsule 0  . omeprazole (PRILOSEC) 40 MG capsule Take 1 capsule (40 mg total) by mouth daily. 90 capsule 3  . vitamin B-12 (CYANOCOBALAMIN) 1000 MCG tablet Take 1,000 mcg by mouth daily.    . Vitamin D, Ergocalciferol, (DRISDOL) 50000 UNITS CAPS capsule Take 1 capsule (50,000 Units total) by mouth every Wednesday. 16 capsule 0   No current facility-administered medications for this visit.    Allergies:   Review of patient's allergies indicates no known allergies.   Social History:  The patient  reports that she quit smoking about 39 years ago. Her smoking use included Cigarettes. She has a 2.5 pack-year smoking history. She has never used smokeless tobacco. She reports that she drinks alcohol. She reports that she does not use illicit drugs.   Family History:  The patient's  family history includes Coronary artery disease in her brother; Diabetes in her brother; Heart attack (age of onset: 92) in her father; Prostate cancer in her brother and son.    ROS:  Please see the history of present illness.   All other systems are reviewed and negative.    PHYSICAL EXAM: VS:  BP 120/70 mmHg  Pulse 64  Ht 5' 3.5" (1.613 m)  Wt 103.42 kg (228 lb)  BMI 39.75 kg/m2 , BMI Body mass index is 39.75 kg/(m^2). GEN: morbidly obese and chronically ill, in a wheelchair today, in no acute distress HEENT: normal Neck: no JVD, carotid bruits, or masses Cardiac: RRR; no murmurs, rubs, or gallops,+ edema  Respiratory:  clear to auscultation bilaterally, normal work of breathing GI: soft, nontender, nondistended, + BS MS: diffuse atrophy Skin: warm and dry  Neuro:  In a wheelchair Psych: euthymic mood, full affect  EKG:  EKG is reviewed from 12/29/14 and reveals sinus rhythm at 70 bpm, normal ekg  Recent Labs: 05/07/2014: Platelets  216 11/24/2014: ALT 9; B Natriuretic Peptide 56.1; BUN 12; Creatinine 0.95; Hemoglobin 13.0; Potassium 4.7; Sodium 139; TSH 1.120    Lipid Panel     Component Value Date/Time   CHOL 172 10/12/2014 1507   CHOL 185 09/02/2013 1204   TRIG 131 10/12/2014 1507   TRIG 114 05/08/2014 0410   HDL 59 10/12/2014 1507   HDL 56 05/08/2014 0410   HDL 59 09/02/2013 1204   CHOLHDL 3.0 05/08/2014 0410   CHOLHDL 3.1 09/02/2013 1204   VLDL 23 05/08/2014 0410   LDLCALC 87 05/08/2014 0410   LDLCALC 100* 09/02/2013 1204     Wt Readings from Last 3 Encounters:  01/03/15 103.42 kg (  228 lb)  12/29/14 102.967 kg (227 lb)  12/02/14 104.146 kg (229 lb 9.6 oz)      Other studies Reviewed: Additional studies/ records that were reviewed today include: Dr Rachel Bo notes, event monitor is also reviewed at length  Review of the above records today demonstrates: noctural bradycardia is observed with short pauses, no daytime events,  A fib with mild elevation in V rates is noted also   ASSESSMENT AND PLAN:  1. Sinus bradycardia On her recent event monitor, most episodes of sinus bradycardia occur before 9 am (she sleeps until 11 am).  I am not convinced that she has daytime bradycardia or that it contributes to her symptoms.  She has multiple chronic disease states which could explain her symptoms.  She seems convinced that bradycardia is her issue and has been told by Dr Rollene Fare "for years" that she may benefit from pacing.  She says that the reason that she did not have daytime bradycardia while wearing her monitor is that she had "only good days". I have encouraged her to have a sleep study to evaluate her nocturnal pauses. She may have sleep apnea which is the cause for this as well as her daytime fatigue.  She is willing to consider a sleep study at this time. In addition, I have offered implantable loop recorder placement to better characterize her symptoms of palpitations and presyncope.  If she is  documented to have symptomatic bradycardia then we could more strongly consider pacing at that time.  2. Paroxysmal atrial fibrillation Weight loss, exercise, and evaluation for sleep apnea are recommended.  Lifestyle modification would be very helpful in managing her afib. Will proceed with implantable loop recorder placement as above for afib management No changes today chads2vasc score is 6 though she is felt to be a poor candidate for anticoagulation.  Could consider left atrial appendage occlusion device once this is available.  3. HTN Stable No change required today  4. Obesity Weight loss Sleep study  Very complicated patient with multiple ongoing and interacting medical issues.  A high level of decision making was required for this patient.  Current medicines are reviewed at length with the patient today.   The patient does not have concerns regarding her medicines.  The following changes were made today:  none  Follow-up:return 4 weeks after ILR implant for further evaluation  Signed, Thompson Grayer, MD  01/03/2015 9:55 PM     [address] [place] [address] [phone] (office) [phone] (fax)

## 2015-01-07 ENCOUNTER — Ambulatory Visit (INDEPENDENT_AMBULATORY_CARE_PROVIDER_SITE_OTHER): Payer: Medicare Other | Admitting: Family Medicine

## 2015-01-07 ENCOUNTER — Encounter: Payer: Self-pay | Admitting: *Deleted

## 2015-01-07 ENCOUNTER — Encounter: Payer: Self-pay | Admitting: Family Medicine

## 2015-01-07 ENCOUNTER — Telehealth: Payer: Self-pay | Admitting: Internal Medicine

## 2015-01-07 VITALS — BP 108/63 | HR 78 | Temp 97.7°F | Ht 63.5 in | Wt 226.0 lb

## 2015-01-07 DIAGNOSIS — J209 Acute bronchitis, unspecified: Secondary | ICD-10-CM

## 2015-01-07 MED ORDER — NYSTATIN 100000 UNIT/ML MT SUSP: 5.0000 mL | Freq: Four times a day (QID) | OROMUCOSAL | Status: DC

## 2015-01-07 MED ORDER — BETAMETHASONE SOD PHOS & ACET 6 (3-3) MG/ML IJ SUSP
6.0000 mg | Freq: Once | INTRAMUSCULAR | Status: AC
  Administered 2015-01-07: 6 mg via INTRAMUSCULAR

## 2015-01-07 MED ORDER — HYDROCODONE-HOMATROPINE 5-1.5 MG/5ML PO SYRP: 5.0000 mL | ORAL_SOLUTION | Freq: Four times a day (QID) | ORAL | Status: DC | PRN

## 2015-01-07 MED ORDER — LEVOFLOXACIN 500 MG PO TABS: 500.0000 mg | ORAL_TABLET | Freq: Every day | ORAL | Status: DC

## 2015-01-07 NOTE — Telephone Encounter (Signed)
Spoke to patient's EC about the patient's "bruising and bleeding" since the linq implant. EC states that the bleeding has stopped, but there is some noticeable ecchymosis. I explained to the [facility] that it is not uncommon to see this after the procedure due to the process. EC voiced understanding. EC also stated that the patient's temperature has been slightly elevated (by 2 degrees) since yesterday and that the patient has felt "yucky". I told the EC that I could not say that the elevated temp is related to the implant, but the patient may need to go to the Urgent Care for further evaluation of this. EC voiced understanding. I encouraged him to call back if further assistance is needed. Again, EC voiced understanding.

## 2015-01-07 NOTE — Progress Notes (Addendum)
Subjective:  Patient ID: Liddie Chichester, female    DOB: 04-May-1934  Age: 81 y.o. MRN: [id number]  CC: Fever and Cough   HPI Madge Therrien presents for Symptoms include congestion, facial pain, nasal congestion, no  fever, purulent productive cough, post nasal drip and sinus pressure with no fever, chills, night sweats or weight loss. Onset of symptoms was a few days ago, gradually worsening since that time.  History Sherell has a past medical history of IBS (irritable bowel syndrome); Hypertension; Hyperlipidemia; GERD (gastroesophageal reflux disease); Depression; Chronic pericarditis; CVA (cerebral infarction) (05/2003); Seizure disorder; TIA (transient ischemic attack); Anxiety; Chronic urinary tract infection; Cerebellar degeneration; Allergic rhinitis; Arthritis; Cataract; Thyroid disease; Seizures; Hypothyroidism; Burning tongue syndrome (25 years); Complication of anesthesia; Stroke (5 years ago); Gastric polyps; Personal history of arterial venous malformation (AVM); Sternum fx (10/27/2013); High cholesterol; Ataxia; Gait disorder; Bradycardia; Hypotension; Obesity; Hypertension; and Paroxysmal atrial fibrillation.   She has past surgical history that includes Knee arthroscopy (Right, 11/14/2006); tibial and fibular internal fixation (Left); Upper gastrointestinal endoscopy (2009, 2013); Colonoscopy (2006, 2009); Appendectomy (81 years old); cyst removed (35 years ago); Total abdominal hysterectomy (81 years old); Knee arthroscopy (Bilateral, 5 and 6 years ago); Knee arthroscopy with lateral menisectomy (07/03/2012); and Cardiac catheterization (N/A, 01/06/2015).   Her family history includes Coronary artery disease in her brother; Diabetes in her brother; Heart attack (age of onset: 46) in her father; Prostate cancer in her brother and son.She reports that she quit smoking about 39 years ago. Her smoking use included Cigarettes. She has a 2.5 pack-year smoking history. She has never used smokeless  tobacco. She reports that she drinks alcohol. She reports that she does not use illicit drugs.  Outpatient Prescriptions Prior to Visit  Medication Sig Dispense Refill  . amitriptyline (ELAVIL) 50 MG tablet Take 0.5 tablets (25 mg total) by mouth at bedtime. 90 tablet 3  . escitalopram (LEXAPRO) 20 MG tablet Take 1 tablet (20 mg total) by mouth at bedtime. 90 tablet 3  . furosemide (LASIX) 40 MG tablet Take 1 tablet (40 mg total) by mouth daily with breakfast. (Patient taking differently: Take 40 mg by mouth daily as needed for edema. ) 90 tablet 3  . gabapentin (NEURONTIN) 300 MG capsule Take 1 capsule (300 mg total) by mouth 3 (three) times daily. 270 capsule 3  . levothyroxine (SYNTHROID, LEVOTHROID) 75 MCG tablet Take 1 tablet (75 mcg total) by mouth daily before breakfast. 90 tablet 2  . lisinopril (PRINIVIL,ZESTRIL) 20 MG tablet Take 1 tablet (20 mg total) by mouth at bedtime. 90 tablet 3  . lovastatin (MEVACOR) 40 MG tablet Take 1 tablet (40 mg total) by mouth at bedtime. 90 tablet 3  . omeprazole (PRILOSEC) 40 MG capsule Take 1 capsule (40 mg total) by mouth daily. 90 capsule 3  . vitamin B-12 (CYANOCOBALAMIN) 1000 MCG tablet Take 1,000 mcg by mouth daily.    . Vitamin D, Ergocalciferol, (DRISDOL) 50000 UNITS CAPS capsule Take 1 capsule (50,000 Units total) by mouth every Wednesday. 16 capsule 0  . amoxicillin-clavulanate (AUGMENTIN) 875-125 MG per tablet Take 1 tablet by mouth 2 (two) times daily. Take all of this medication 20 tablet 0  . nitrofurantoin, macrocrystal-monohydrate, (MACROBID) 100 MG capsule Take 1 capsule (100 mg total) by mouth 2 (two) times daily. 14 capsule 0   No facility-administered medications prior to visit.    ROS Review of Systems  Constitutional: Negative for fever, chills, activity change and appetite change.  HENT: Positive for congestion, postnasal  drip, rhinorrhea and sinus pressure. Negative for ear discharge, ear pain, hearing loss, nosebleeds,  sneezing and trouble swallowing.   Respiratory: Positive for cough, chest tightness and wheezing. Negative for shortness of breath and stridor.   Cardiovascular: Negative for chest pain and palpitations.  Skin: Negative for rash.    Objective:  BP 108/63 mmHg  Pulse 78  Temp(Src) 97.7 F (36.5 C) (Oral)  Ht 5' 3.5" (1.613 m)  Wt 226 lb (102.513 kg)  BMI 39.40 kg/m2  BP Readings from Last 3 Encounters:  01/07/15 108/63  01/06/15 145/75  01/03/15 120/70    Wt Readings from Last 3 Encounters:  01/07/15 226 lb (102.513 kg)  01/06/15 226 lb (102.513 kg)  01/03/15 228 lb (103.42 kg)     Physical Exam  Constitutional: She appears well-developed and well-nourished.  HENT:  Head: Normocephalic and atraumatic.  Right Ear: Tympanic membrane and external ear normal. No decreased hearing is noted.  Left Ear: Tympanic membrane and external ear normal. No decreased hearing is noted.  Nose: Mucosal edema present. Right sinus exhibits no frontal sinus tenderness. Left sinus exhibits no frontal sinus tenderness.  Mouth/Throat: No oropharyngeal exudate or posterior oropharyngeal erythema.  Neck: No Brudzinski's sign noted.  Pulmonary/Chest: Breath sounds normal. No respiratory distress.  Lymphadenopathy:       Head (right side): No preauricular adenopathy present.       Head (left side): No preauricular adenopathy present.       Right cervical: No superficial cervical adenopathy present.      Left cervical: No superficial cervical adenopathy present.    No results found for: HGBA1C  Lab Results  Component Value Date   WBC 7.3 11/24/2014   HGB 13.0 11/24/2014   HCT 42.6 11/24/2014   PLT 216 05/07/2014   GLUCOSE 94 11/24/2014   CHOL 172 10/12/2014   TRIG 131 10/12/2014   HDL 59 10/12/2014   LDLCALC 87 05/08/2014   ALT 9 11/24/2014   AST 14 11/24/2014   NA 139 11/24/2014   K 4.7 11/24/2014   CL 101 11/24/2014   CREATININE 0.95 11/24/2014   BUN 12 11/24/2014   CO2 23  11/24/2014   TSH 1.120 11/24/2014    No results found.  Assessment & Plan:   Zahraa was seen today for fever and cough.  Diagnoses and all orders for this visit:  Acute bronchitis, unspecified organism Orders: -     betamethasone acetate-betamethasone sodium phosphate (CELESTONE) injection 6 mg; Inject 1 mL (6 mg total) into the muscle once.  Other orders -     levofloxacin (LEVAQUIN) 500 MG tablet; Take 1 tablet (500 mg total) by mouth daily. -     nystatin (MYCOSTATIN) 100000 UNIT/ML suspension; Take 5 mLs (500,000 Units total) by mouth 4 (four) times daily. -     HYDROcodone-homatropine (HYCODAN) 5-1.5 MG/5ML syrup; Take 5 mLs by mouth every 6 (six) hours as needed for cough.   I have discontinued Ms. Bajaj's nitrofurantoin (macrocrystal-monohydrate) and amoxicillin-clavulanate. I am also having her start on levofloxacin, nystatin, and HYDROcodone-homatropine. Additionally, I am having her maintain her vitamin B-12, amitriptyline, escitalopram, gabapentin, lisinopril, lovastatin, omeprazole, Vitamin D (Ergocalciferol), furosemide, and levothyroxine. We administered betamethasone acetate-betamethasone sodium phosphate.  Meds ordered this encounter  Medications  . levofloxacin (LEVAQUIN) 500 MG tablet    Sig: Take 1 tablet (500 mg total) by mouth daily.    Dispense:  7 tablet    Refill:  0  . nystatin (MYCOSTATIN) 100000 UNIT/ML suspension    Sig:  Take 5 mLs (500,000 Units total) by mouth 4 (four) times daily.    Dispense:  60 mL    Refill:  0  . betamethasone acetate-betamethasone sodium phosphate (CELESTONE) injection 6 mg    Sig:   . HYDROcodone-homatropine (HYCODAN) 5-1.5 MG/5ML syrup    Sig: Take 5 mLs by mouth every 6 (six) hours as needed for cough.    Dispense:  120 mL    Refill:  0     Follow-up: Return if symptoms worsen or fail to improve.  [name], M.D.

## 2015-01-07 NOTE — Telephone Encounter (Signed)
New message      1. Has your device fired? no 2. Is you device beeping? no  3. Are you experiencing draining or swelling at device site?  Bleeding and bruised  4. Are you calling to see if we received your device transmission? no  5. Have you passed out? no Pt had a loop recorder put in yesterday.  She also had an elevated temp last night

## 2015-01-17 ENCOUNTER — Ambulatory Visit (INDEPENDENT_AMBULATORY_CARE_PROVIDER_SITE_OTHER): Payer: Medicare Other | Admitting: *Deleted

## 2015-01-17 ENCOUNTER — Encounter: Payer: Self-pay | Admitting: Internal Medicine

## 2015-01-17 DIAGNOSIS — I639 Cerebral infarction, unspecified: Secondary | ICD-10-CM

## 2015-01-17 LAB — CUP PACEART INCLINIC DEVICE CHECK
MDC IDC SESS DTM: 20160620151651
MDC IDC SET ZONE DETECTION INTERVAL: 390 ms
Zone Setting Detection Interval: 2000 ms
Zone Setting Detection Interval: 4500 ms

## 2015-01-17 NOTE — Progress Notes (Signed)
ILR Wound check appointment. Steri-strips removed prior to arrival. Wound without edema. Bruising present but showing dissipation. Incision edges approximated, wound well healed. Pt with 0 tachy episodes; 0 brady episodes; 0 asystole. 9 AF, 1.2%---episodes were sinus brady w/ intermittent junctional rhythm. Pt is asymptomatic---pt does not wake up until 10:30a.m; majority of episodes are prior to awakening. ROV w/ JA 02/07/15. [facility] summary reports QMO.

## 2015-01-18 ENCOUNTER — Telehealth: Payer: Self-pay | Admitting: *Deleted

## 2015-01-18 NOTE — Telephone Encounter (Signed)
I have left two messages for patient to call regarding home sleep study

## 2015-01-24 DIAGNOSIS — G4733 Obstructive sleep apnea (adult) (pediatric): Secondary | ICD-10-CM | POA: Diagnosis not present

## 2015-01-25 DIAGNOSIS — G4733 Obstructive sleep apnea (adult) (pediatric): Secondary | ICD-10-CM | POA: Diagnosis not present

## 2015-01-26 DIAGNOSIS — G4733 Obstructive sleep apnea (adult) (pediatric): Secondary | ICD-10-CM | POA: Diagnosis not present

## 2015-02-02 ENCOUNTER — Encounter: Payer: Self-pay | Admitting: Internal Medicine

## 2015-02-03 ENCOUNTER — Ambulatory Visit: Payer: Medicare Other

## 2015-02-03 ENCOUNTER — Telehealth: Payer: Self-pay | Admitting: *Deleted

## 2015-02-03 NOTE — Telephone Encounter (Signed)
lmtcb  (need to inform patient when she calls back:  Home sleep study results normal)

## 2015-02-04 ENCOUNTER — Ambulatory Visit (INDEPENDENT_AMBULATORY_CARE_PROVIDER_SITE_OTHER): Payer: Medicare Other | Admitting: *Deleted

## 2015-02-04 ENCOUNTER — Encounter: Payer: Self-pay | Admitting: Internal Medicine

## 2015-02-04 DIAGNOSIS — R002 Palpitations: Secondary | ICD-10-CM

## 2015-02-07 ENCOUNTER — Encounter: Payer: Self-pay | Admitting: Internal Medicine

## 2015-02-07 ENCOUNTER — Ambulatory Visit (INDEPENDENT_AMBULATORY_CARE_PROVIDER_SITE_OTHER): Payer: Medicare Other | Admitting: Internal Medicine

## 2015-02-07 VITALS — BP 102/60 | HR 76 | Ht 63.0 in | Wt 225.4 lb

## 2015-02-07 DIAGNOSIS — R001 Bradycardia, unspecified: Secondary | ICD-10-CM

## 2015-02-07 DIAGNOSIS — I10 Essential (primary) hypertension: Secondary | ICD-10-CM

## 2015-02-07 DIAGNOSIS — I480 Paroxysmal atrial fibrillation: Secondary | ICD-10-CM | POA: Diagnosis not present

## 2015-02-07 DIAGNOSIS — I639 Cerebral infarction, unspecified: Secondary | ICD-10-CM | POA: Diagnosis not present

## 2015-02-07 LAB — CUP PACEART INCLINIC DEVICE CHECK
MDC IDC SESS DTM: 20160711161507
MDC IDC SET ZONE DETECTION INTERVAL: 4500 ms
Zone Setting Detection Interval: 2000 ms
Zone Setting Detection Interval: 390 ms

## 2015-02-07 NOTE — Progress Notes (Signed)
Electrophysiology Office Note   Date:  02/07/2015   ID:  [name], DOB 07/16/1934, MRN [id number]  PCP:  [name], MD  Cardiologist:  Dr Gwenlyn Found Primary Electrophysiologist: Thompson Grayer, MD    Chief Complaint  Patient presents with  . Palpitations  . Bradycardia     History of Present Illness: [name] is a 81 y.o. female who presents today for electrophysiology follow-up.   She has a longstanding history of multiple chronic medical issues.  She is not active.  Her primary concern is with progressive cerebellar disease and unsteadiness from this.  She has some fatigue.  She does not go to bed until very late and then sleeps until about 11am.  She is tired upon waking and her energy is poor.  She has periods where she feels that she may collapse.  She has not had frank syncope.  She has "good days and bad days".  She has occasional tachypalpitations.  She has chronic edema.    She recently had a sleep study which did not reveal sleep apnea.  She has done well since her ILR implant and has had no further episodes of dizziness or presyncope.  Today, she denies symptoms of chest pain, shortness of breath, orthopnea, PND, claudication,  bleeding, or neurologic sequela. The patient is tolerating medications without difficulties and is otherwise without complaint today.    Past Medical History  Diagnosis Date  . IBS (irritable bowel syndrome)   . Hypertension   . Hyperlipidemia   . GERD (gastroesophageal reflux disease)   . Depression   . Chronic pericarditis   . CVA (cerebral infarction) 05/2003  . Seizure disorder   . TIA (transient ischemic attack)   . Anxiety   . Chronic urinary tract infection   . Cerebellar degeneration   . Allergic rhinitis     PT. DENIES  . Arthritis     NECK  . Cataract   . Thyroid disease   . Seizures   . Hypothyroidism   . Burning tongue syndrome 25 years  . Complication of anesthesia     low o2 sats, coded 30 years ago  . Stroke  5 years ago    Right side of face weak, slurred speach  . Gastric polyps   . Personal history of arterial venous malformation (AVM)     right side of face  . Sternum fx 10/27/2013  . High cholesterol   . Ataxia   . Gait disorder   . Bradycardia     primarily nocturnal  . Hypotension   . Obesity   . Hypertension   . Paroxysmal atrial fibrillation     chads2vasc score is 6,  she is felt to be a poor candidate for anticoagulation   Past Surgical History  Procedure Laterality Date  . Knee arthroscopy Right 11/14/2006  . Tibial and fibular internal fixation Left   . Upper gastrointestinal endoscopy  2009, 2013  . Colonoscopy  2006, 2009  . Appendectomy  81 years old  . Cyst removed  35 years ago  . Total abdominal hysterectomy  81 years old  . Knee arthroscopy Bilateral 5 and 6 years ago  . Knee arthroscopy with lateral menisectomy  07/03/2012    Procedure: KNEE ARTHROSCOPY WITH LATERAL MENISECTOMY;  Surgeon: Magnus Sinning, MD;  Location: [facility];  Service: Orthopedics;  Laterality: Left;  with Partial Lateral Menisectomy and Medial Menisectomy. Shaving of medial and lateral femoral condyles. Shaving of patella. Removal of a loose body  .  Ep implantable device N/A 01/06/2015    Procedure: Loop Recorder Insertion;  Surgeon: Thompson Grayer, MD;  Location: [facility];  Service: Cardiovascular;  Laterality: N/A;     Current Outpatient Prescriptions  Medication Sig Dispense Refill  . amitriptyline (ELAVIL) 50 MG tablet Take 0.5 tablets (25 mg total) by mouth at bedtime. 90 tablet 3  . escitalopram (LEXAPRO) 20 MG tablet Take 1 tablet (20 mg total) by mouth at bedtime. 90 tablet 3  . furosemide (LASIX) 40 MG tablet Take 40 mg by mouth daily as needed.    . gabapentin (NEURONTIN) 300 MG capsule Take 1 capsule (300 mg total) by mouth 3 (three) times daily. 270 capsule 3  . HYDROcodone-homatropine (HYCODAN) 5-1.5 MG/5ML syrup Take 5 mLs by mouth every 6 (six) hours as needed for cough.  120 mL 0  . levothyroxine (SYNTHROID, LEVOTHROID) 75 MCG tablet Take 1 tablet (75 mcg total) by mouth daily before breakfast. 90 tablet 2  . lisinopril (PRINIVIL,ZESTRIL) 20 MG tablet Take 1 tablet (20 mg total) by mouth at bedtime. 90 tablet 3  . lovastatin (MEVACOR) 40 MG tablet Take 1 tablet (40 mg total) by mouth at bedtime. 90 tablet 3  . nystatin (MYCOSTATIN) 100000 UNIT/ML suspension Take 5 mLs by mouth 4 (four) times daily as needed (ONLY WHEN TAKING ANTIBIOTICS).    Marland Kitchen omeprazole (PRILOSEC) 40 MG capsule Take 1 capsule (40 mg total) by mouth daily. 90 capsule 3  . vitamin B-12 (CYANOCOBALAMIN) 1000 MCG tablet Take 1,000 mcg by mouth daily.    . Vitamin D, Ergocalciferol, (DRISDOL) 50000 UNITS CAPS capsule Take 1 capsule (50,000 Units total) by mouth every Wednesday. 16 capsule 0  . levofloxacin (LEVAQUIN) 500 MG tablet Take 1 tablet (500 mg total) by mouth daily. (Patient not taking: Reported on 02/07/2015) 7 tablet 0   No current facility-administered medications for this visit.    Allergies:   Review of patient's allergies indicates no known allergies.   Social History:  The patient  reports that she quit smoking about 39 years ago. Her smoking use included Cigarettes. She has a 2.5 pack-year smoking history. She has never used smokeless tobacco. She reports that she drinks alcohol. She reports that she does not use illicit drugs.   Family History:  The patient's  family history includes Coronary artery disease in her brother; Diabetes in her brother; Heart attack (age of onset: 10) in her father; Prostate cancer in her brother and son.    ROS:  Please see the history of present illness.   All other systems are reviewed and negative.    PHYSICAL EXAM: VS:  BP 102/60 mmHg  Pulse 76  Ht '5\' 3"'$  (1.6 m)  Wt 102.241 kg (225 lb 6.4 oz)  BMI 39.94 kg/m2 , BMI Body mass index is 39.94 kg/(m^2). GEN: morbidly obese and chronically ill, in a wheelchair today, in no acute distress HEENT:  normal Neck: no JVD, carotid bruits, or masses Cardiac: RRR; no murmurs, rubs, or gallops,+ edema  Respiratory:  clear to auscultation bilaterally, normal work of breathing GI: soft, nontender, nondistended, + BS MS: diffuse atrophy Skin: warm and dry  Neuro:  In a wheelchair Psych: euthymic mood, full affect  Recent Labs: 05/07/2014: Platelets 216 11/24/2014: ALT 9; BNP 56.1; BUN 12; Creatinine, Ser 0.95; Hemoglobin 13.0; Potassium 4.7; Sodium 139; TSH 1.120    Lipid Panel     Component Value Date/Time   CHOL 172 10/12/2014 1507   CHOL 185 09/02/2013 1204  TRIG 131 10/12/2014 1507   TRIG 114 05/08/2014 0410   HDL 59 10/12/2014 1507   HDL 56 05/08/2014 0410   HDL 59 09/02/2013 1204   CHOLHDL 3.0 05/08/2014 0410   CHOLHDL 3.1 09/02/2013 1204   VLDL 23 05/08/2014 0410   LDLCALC 87 05/08/2014 0410   LDLCALC 100* 09/02/2013 1204     Wt Readings from Last 3 Encounters:  02/07/15 102.241 kg (225 lb 6.4 oz)  01/07/15 102.513 kg (226 lb)  01/06/15 102.513 kg (226 lb)     ASSESSMENT AND PLAN:  1. Sinus bradycardia ILR interrogation today reveals only nocturnal bradycardia. She has had no symptomatic transmissions and feels that her dizziness/ presyncope are much better presently.  2. Paroxysmal atrial fibrillation Weight loss, exercise, and evaluation for sleep apnea are recommended.  Lifestyle modification would be very helpful in managing her afib. No afib by ILR since implant.  (Several episodes of sinus brady (nocturnal) with junctional rhythm has been mislabeled by the device as afib.) chads2vasc score is 6 though she is felt to be a poor candidate for anticoagulation.  Could consider left atrial appendage occlusion device once this is available.  3. HTN Stable No change required today  4. Obesity Weight loss Sleep study Current medicines are reviewed at length with the patient today.   The patient does not have concerns regarding her medicines.  The following  changes were made today:  none  Follow-up with Dr Gwenlyn Found as scheduled Return to see EP clinic APP in 6 months   Signed, Thompson Grayer, MD  02/07/2015 9:50 PM     [facility] [address] [place]  [place] [id number] [phone] (office) [phone] (fax)

## 2015-02-07 NOTE — Patient Instructions (Signed)
Medication Instructions: - no changes  Labwork: - none  Procedures/Testing: - none  Follow-Up: - Your physician wants you to follow-up in: 6 months with Chanetta Marshall, NP You will receive a reminder letter in the mail two months in advance. If you don't receive a letter, please call our office to schedule the follow-up appointment.  Any Additional Special Instructions Will Be Listed Below (If Applicable). - none

## 2015-02-08 ENCOUNTER — Encounter: Payer: Self-pay | Admitting: Internal Medicine

## 2015-02-10 NOTE — Progress Notes (Signed)
Loop recorder 

## 2015-02-11 ENCOUNTER — Encounter: Payer: Self-pay | Admitting: Internal Medicine

## 2015-02-13 ENCOUNTER — Other Ambulatory Visit: Payer: Self-pay | Admitting: Family Medicine

## 2015-02-14 NOTE — Telephone Encounter (Signed)
Last seen 01/07/15 Dr [name]  Last Vit D 10/12/14  35.2

## 2015-02-15 DIAGNOSIS — H25813 Combined forms of age-related cataract, bilateral: Secondary | ICD-10-CM | POA: Diagnosis not present

## 2015-02-16 ENCOUNTER — Encounter: Payer: Self-pay | Admitting: Internal Medicine

## 2015-02-25 DIAGNOSIS — R69 Illness, unspecified: Secondary | ICD-10-CM | POA: Diagnosis not present

## 2015-03-02 DIAGNOSIS — H3531 Nonexudative age-related macular degeneration: Secondary | ICD-10-CM | POA: Diagnosis not present

## 2015-03-02 DIAGNOSIS — H25811 Combined forms of age-related cataract, right eye: Secondary | ICD-10-CM | POA: Diagnosis not present

## 2015-03-07 ENCOUNTER — Ambulatory Visit (INDEPENDENT_AMBULATORY_CARE_PROVIDER_SITE_OTHER): Payer: Medicare Other | Admitting: *Deleted

## 2015-03-07 DIAGNOSIS — R002 Palpitations: Secondary | ICD-10-CM

## 2015-03-07 LAB — CUP PACEART REMOTE DEVICE CHECK: MDC IDC SESS DTM: 20160708040500

## 2015-03-08 NOTE — Progress Notes (Signed)
Loop recorder 

## 2015-03-14 LAB — CUP PACEART REMOTE DEVICE CHECK: MDC IDC SESS DTM: 20160815140145

## 2015-03-15 ENCOUNTER — Telehealth: Payer: Self-pay

## 2015-03-15 NOTE — Telephone Encounter (Signed)
Request for surgical clearance:  1. What type of surgery is being performed? Cataract surgery   2. When is this surgery scheduled? 03/17/15?   3. Are there any medications that need to be held prior to surgery and how long? NA   4. Name of physician performing surgery? Dr Rosalita Chessman at [facility]   5. What is your office phone and fax number? Fax- [phone]

## 2015-03-16 ENCOUNTER — Encounter: Payer: Self-pay | Admitting: Family Medicine

## 2015-03-16 ENCOUNTER — Ambulatory Visit (INDEPENDENT_AMBULATORY_CARE_PROVIDER_SITE_OTHER): Payer: Medicare Other | Admitting: Family Medicine

## 2015-03-16 VITALS — BP 133/76 | HR 102 | Temp 97.0°F | Ht 63.0 in | Wt 238.0 lb

## 2015-03-16 DIAGNOSIS — I639 Cerebral infarction, unspecified: Secondary | ICD-10-CM | POA: Diagnosis not present

## 2015-03-16 DIAGNOSIS — J209 Acute bronchitis, unspecified: Secondary | ICD-10-CM

## 2015-03-16 DIAGNOSIS — G4700 Insomnia, unspecified: Secondary | ICD-10-CM

## 2015-03-16 MED ORDER — PREDNISONE 10 MG PO TABS: ORAL_TABLET | ORAL | Status: DC

## 2015-03-16 MED ORDER — BENZONATATE 200 MG PO CAPS: 200.0000 mg | ORAL_CAPSULE | Freq: Three times a day (TID) | ORAL | Status: DC | PRN

## 2015-03-16 MED ORDER — TRAZODONE HCL 150 MG PO TABS: ORAL_TABLET | ORAL | Status: DC

## 2015-03-16 NOTE — Progress Notes (Signed)
Subjective:  Patient ID: [name], female    DOB: 06-28-1934  Age: 81 y.o. MRN: [id number]  CC: Cough   HPI [name] presents for incresaed cough. Present since last OV. Wheezing. Nonproductive. Dyspnea is not new. Now at baseline. ST from coughing so much is just starting.Denies fever.  Not able to maintain sleep >2hours at a time. Offered a Xanax from [name] daughter and slept much better. She requests a prescription for that medication.  History [name] has a past medical history of IBS (irritable bowel syndrome); Hypertension; Hyperlipidemia; GERD (gastroesophageal reflux disease); Depression; Chronic pericarditis; CVA (cerebral infarction) (05/2003); Seizure disorder; TIA (transient ischemic attack); Anxiety; Chronic urinary tract infection; Cerebellar degeneration; Allergic rhinitis; Arthritis; Cataract; Thyroid disease; Seizures; Hypothyroidism; Burning tongue syndrome (25 years); Complication of anesthesia; Stroke (5 years ago); Gastric polyps; Personal history of arterial venous malformation (AVM); Sternum fx (10/27/2013); High cholesterol; Ataxia; Gait disorder; Bradycardia; Hypotension; Obesity; Hypertension; and Paroxysmal atrial fibrillation.   She has past surgical history that includes Knee arthroscopy (Right, 11/14/2006); tibial and fibular internal fixation (Left); Upper gastrointestinal endoscopy (2009, 2013); Colonoscopy (2006, 2009); Appendectomy (81 years old); cyst removed (35 years ago); Total abdominal hysterectomy (81 years old); Knee arthroscopy (Bilateral, 5 and 6 years ago); Knee arthroscopy with lateral menisectomy (07/03/2012); and Cardiac catheterization (N/A, 01/06/2015).   [name] family history includes Coronary artery disease in [name] brother; Diabetes in [name] brother; Heart attack (age of onset: 53) in [name] father; Prostate cancer in [name] brother and son.She reports that she quit smoking about 39 years ago. [name] smoking use included Cigarettes. She has a 2.5 pack-year  smoking history. She has never used smokeless tobacco. She reports that she drinks alcohol. She reports that she does not use illicit drugs.  Outpatient Prescriptions Prior to Visit  Medication Sig Dispense Refill  . amitriptyline (ELAVIL) 50 MG tablet Take 0.5 tablets (25 mg total) by mouth at bedtime. 90 tablet 3  . escitalopram (LEXAPRO) 20 MG tablet Take 1 tablet (20 mg total) by mouth at bedtime. 90 tablet 3  . furosemide (LASIX) 40 MG tablet Take 40 mg by mouth daily as needed.    . gabapentin (NEURONTIN) 300 MG capsule Take 1 capsule (300 mg total) by mouth 3 (three) times daily. 270 capsule 3  . levothyroxine (SYNTHROID, LEVOTHROID) 75 MCG tablet Take 1 tablet (75 mcg total) by mouth daily before breakfast. 90 tablet 2  . lisinopril (PRINIVIL,ZESTRIL) 20 MG tablet Take 1 tablet (20 mg total) by mouth at bedtime. 90 tablet 3  . lovastatin (MEVACOR) 40 MG tablet Take 1 tablet (40 mg total) by mouth at bedtime. 90 tablet 3  . nystatin (MYCOSTATIN) 100000 UNIT/ML suspension Take 5 mLs by mouth 4 (four) times daily as needed (ONLY WHEN TAKING ANTIBIOTICS).    Marland Kitchen omeprazole (PRILOSEC) 40 MG capsule Take 1 capsule (40 mg total) by mouth daily. 90 capsule 3  . vitamin B-12 (CYANOCOBALAMIN) 1000 MCG tablet Take 1,000 mcg by mouth daily.    . Vitamin D, Ergocalciferol, (DRISDOL) 50000 UNITS CAPS capsule Take 1 capsule (50,000 Units total) by mouth every Wednesday. 16 capsule 0  . HYDROcodone-homatropine (HYCODAN) 5-1.5 MG/5ML syrup Take 5 mLs by mouth every 6 (six) hours as needed for cough. (Patient not taking: Reported on 03/16/2015) 120 mL 0  . levofloxacin (LEVAQUIN) 500 MG tablet Take 1 tablet (500 mg total) by mouth daily. (Patient not taking: Reported on 02/07/2015) 7 tablet 0   No facility-administered medications prior to visit.    ROS  Review of Systems  Constitutional: Negative for fever, chills, activity change and appetite change.  HENT: Positive for congestion, postnasal drip,  rhinorrhea and sinus pressure. Negative for ear discharge, ear pain, hearing loss, nosebleeds, sneezing and trouble swallowing.   Respiratory: Positive for cough and shortness of breath. Negative for chest tightness.   Cardiovascular: Negative for chest pain and palpitations.  Skin: Negative for rash.  Neurological: Positive for weakness (eneralized nonfocal). Negative for dizziness.  Psychiatric/Behavioral: Positive for sleep disturbance.    Objective:  BP 133/76 mmHg  Pulse 102  Temp(Src) 97 F (36.1 C) (Oral)  Ht '5\' 3"'$  (1.6 m)  Wt 238 lb (107.956 kg)  BMI 42.17 kg/m2  BP Readings from Last 3 Encounters:  03/16/15 133/76  02/07/15 102/60  01/07/15 108/63    Wt Readings from Last 3 Encounters:  03/16/15 238 lb (107.956 kg)  02/07/15 225 lb 6.4 oz (102.241 kg)  01/07/15 226 lb (102.513 kg)     Physical Exam  No results found for: HGBA1C  Lab Results  Component Value Date   WBC 7.3 11/24/2014   HGB 13.0 11/24/2014   HCT 42.6 11/24/2014   PLT 216 05/07/2014   GLUCOSE 94 11/24/2014   CHOL 172 10/12/2014   TRIG 131 10/12/2014   HDL 59 10/12/2014   LDLCALC 87 05/08/2014   ALT 9 11/24/2014   AST 14 11/24/2014   NA 139 11/24/2014   K 4.7 11/24/2014   CL 101 11/24/2014   CREATININE 0.95 11/24/2014   BUN 12 11/24/2014   CO2 23 11/24/2014   TSH 1.120 11/24/2014    No results found.  Assessment & Plan:   [name] was seen today for cough.  Diagnoses and all orders for this visit:  Acute bronchitis, unspecified organism  Insomnia  Other orders -     Discontinue: predniSONE (DELTASONE) 10 MG tablet; Take 5 daily for 3 days followed by 4,3,2 and 1 for 3 days each. -     Discontinue: benzonatate (TESSALON) 200 MG capsule; Take 1 capsule (200 mg total) by mouth 3 (three) times daily as needed for cough. -     traZODone (DESYREL) 150 MG tablet; 1/2 to 1 at bedtime as needed for sleep -     benzonatate (TESSALON) 200 MG capsule; Take 1 capsule (200 mg total) by  mouth 3 (three) times daily as needed for cough. -     predniSONE (DELTASONE) 10 MG tablet; Take 5 daily for 3 days followed by 4,3,2 and 1 for 3 days each.   I have discontinued [name]'s levofloxacin and HYDROcodone-homatropine. I am also having [name] start on traZODone. Additionally, I am having [name] maintain [name] vitamin B-12, amitriptyline, escitalopram, gabapentin, lisinopril, lovastatin, omeprazole, Vitamin D (Ergocalciferol), levothyroxine, furosemide, nystatin, benzonatate, and predniSONE.  Meds ordered this encounter  Medications  . DISCONTD: predniSONE (DELTASONE) 10 MG tablet    Sig: Take 5 daily for 3 days followed by 4,3,2 and 1 for 3 days each.    Dispense:  45 tablet    Refill:  0  . DISCONTD: benzonatate (TESSALON) 200 MG capsule    Sig: Take 1 capsule (200 mg total) by mouth 3 (three) times daily as needed for cough.    Dispense:  30 capsule    Refill:  2  . traZODone (DESYREL) 150 MG tablet    Sig: 1/2 to 1 at bedtime as needed for sleep    Dispense:  90 tablet    Refill:  2  . benzonatate (TESSALON) 200 MG  capsule    Sig: Take 1 capsule (200 mg total) by mouth 3 (three) times daily as needed for cough.    Dispense:  30 capsule    Refill:  2  . predniSONE (DELTASONE) 10 MG tablet    Sig: Take 5 daily for 3 days followed by 4,3,2 and 1 for 3 days each.    Dispense:  45 tablet    Refill:  0   I advised patient that starting habit-forming benzodiazepine's that had been shown to decrease judgment skills were probably not [name] best option for sleep.  Follow-up: Return if symptoms worsen or fail to improve.  [name], M.D.

## 2015-03-16 NOTE — Telephone Encounter (Signed)
[facility] is still waiting to hear regarding this patient's surgical clearance.  Please call [phone].  Her surgery is scheduled for this Monday 03/21/15.

## 2015-03-16 NOTE — Telephone Encounter (Signed)
OK to proceed with cataract surg

## 2015-03-17 NOTE — Telephone Encounter (Signed)
Faxed encounter to [facility], Dr Loralie Champagne office

## 2015-03-21 DIAGNOSIS — H25811 Combined forms of age-related cataract, right eye: Secondary | ICD-10-CM | POA: Diagnosis not present

## 2015-03-21 DIAGNOSIS — H2511 Age-related nuclear cataract, right eye: Secondary | ICD-10-CM | POA: Diagnosis not present

## 2015-03-24 ENCOUNTER — Encounter: Payer: Self-pay | Admitting: Internal Medicine

## 2015-04-06 ENCOUNTER — Ambulatory Visit (INDEPENDENT_AMBULATORY_CARE_PROVIDER_SITE_OTHER): Payer: Medicare Other | Admitting: *Deleted

## 2015-04-06 DIAGNOSIS — R002 Palpitations: Secondary | ICD-10-CM | POA: Diagnosis not present

## 2015-04-07 ENCOUNTER — Ambulatory Visit (INDEPENDENT_AMBULATORY_CARE_PROVIDER_SITE_OTHER): Payer: Medicare Other | Admitting: Family

## 2015-04-07 ENCOUNTER — Encounter: Payer: Self-pay | Admitting: Family

## 2015-04-07 VITALS — BP 132/72 | HR 83 | Temp 96.0°F

## 2015-04-07 DIAGNOSIS — N3001 Acute cystitis with hematuria: Secondary | ICD-10-CM

## 2015-04-07 DIAGNOSIS — R1030 Lower abdominal pain, unspecified: Secondary | ICD-10-CM | POA: Diagnosis not present

## 2015-04-07 LAB — POCT URINALYSIS DIPSTICK
Bilirubin, UA: NEGATIVE
Glucose, UA: NEGATIVE
Ketones, UA: NEGATIVE
Nitrite, UA: POSITIVE
PH UA: 6
Spec Grav, UA: <=1.005
UROBILINOGEN UA: NEGATIVE

## 2015-04-07 LAB — POCT UA - MICROSCOPIC ONLY
CASTS, UR, LPF, POC: NEGATIVE
Crystals, Ur, HPF, POC: NEGATIVE
YEAST UA: NEGATIVE

## 2015-04-07 MED ORDER — SULFAMETHOXAZOLE-TRIMETHOPRIM 800-160 MG PO TABS: 1.0000 | ORAL_TABLET | Freq: Two times a day (BID) | ORAL | Status: DC

## 2015-04-07 NOTE — Patient Instructions (Signed)

## 2015-04-07 NOTE — Progress Notes (Signed)
Loop recorder 

## 2015-04-07 NOTE — Progress Notes (Signed)
   Subjective:    Patient ID: [name], female    DOB: 1933-11-03, 81 y.o.   MRN: [id number]  Abdominal Pain This is a new problem. The current episode started in the past 7 days. The onset quality is gradual. The problem occurs constantly. The problem has been gradually worsening. The pain is located in the suprapubic region. The pain is at a severity of 8/10. The pain is mild. The quality of the pain is burning (Pressure). Associated symptoms include dysuria and frequency. Pertinent negatives include no headaches, nausea or vomiting. She has tried nothing for the symptoms. The treatment provided no relief.  Dysuria  This is a new problem. The current episode started in the past 7 days. The problem occurs every urination. The problem has been gradually worsening. The quality of the pain is described as burning. The pain is moderate. Associated symptoms include frequency and urgency. Pertinent negatives include no hesitancy, nausea or vomiting. She has tried increased fluids for the symptoms. The treatment provided mild relief.      Review of Systems  Constitutional: Negative.   HENT: Negative.   Eyes: Negative.   Respiratory: Negative.  Negative for shortness of breath.   Cardiovascular: Negative.  Negative for palpitations.  Gastrointestinal: Positive for abdominal pain. Negative for nausea and vomiting.  Endocrine: Negative.   Genitourinary: Positive for dysuria, urgency and frequency. Negative for hesitancy.  Musculoskeletal: Negative.   Neurological: Negative.  Negative for headaches.  Hematological: Negative.   Psychiatric/Behavioral: Negative.   All other systems reviewed and are negative.      Objective:   Physical Exam  Constitutional: She is oriented to person, place, and time. She appears well-developed and well-nourished. No distress.  HENT:  Head: Normocephalic and atraumatic.  Eyes: Pupils are equal, round, and reactive to light.  Neck: Normal range of motion.  Neck supple. No thyromegaly present.  Cardiovascular: Normal rate, regular rhythm, normal heart sounds and intact distal pulses.   No murmur heard. Pulmonary/Chest: Effort normal and breath sounds normal. No respiratory distress. She has no wheezes.  Abdominal: Soft. Bowel sounds are normal. She exhibits no distension. There is no tenderness.  Musculoskeletal: Normal range of motion. She exhibits no edema or tenderness.  Negative for cva tenderness   Neurological: She is alert and oriented to person, place, and time. She has normal reflexes. No cranial nerve deficit.  Skin: Skin is warm and dry.  Psychiatric: She has a normal mood and affect. Her behavior is normal. Judgment and thought content normal.  Vitals reviewed.   BP 132/72 mmHg  Pulse 83  Temp(Src) 96 F (35.6 C) (Oral)  Wt        Assessment & Plan:  1. Lower abdominal pain - POCT UA - Microscopic Only - POCT urinalysis dipstick - Urine culture  2. Acute cystitis with hematuria -Force fluids AZO over the counter X2 days RTO prn Culture pending - sulfamethoxazole-trimethoprim (BACTRIM DS,SEPTRA DS) 800-160 MG per tablet; Take 1 tablet by mouth 2 (two) times daily.  Dispense: 10 tablet; Refill: 0  Evelina Dun, FNP

## 2015-04-08 LAB — CUP PACEART REMOTE DEVICE CHECK: MDC IDC SESS DTM: 20160909092055

## 2015-04-08 NOTE — Progress Notes (Signed)
Carelink summary report received. Battery status OK. Normal device function. No new symptom episodes, tachy episodes, brady, or pause episodes. No new AF episodes. Monthly summary reports and ROV with JA on 06/20/2015 at 11:15am.

## 2015-04-10 LAB — URINE CULTURE

## 2015-04-12 ENCOUNTER — Ambulatory Visit: Payer: Medicare Other | Admitting: Family Medicine

## 2015-04-14 ENCOUNTER — Encounter: Payer: Self-pay | Admitting: Family

## 2015-04-14 ENCOUNTER — Other Ambulatory Visit: Payer: Self-pay | Admitting: Family

## 2015-04-14 ENCOUNTER — Ambulatory Visit: Payer: Medicare Other | Admitting: Family Medicine

## 2015-04-14 ENCOUNTER — Ambulatory Visit (INDEPENDENT_AMBULATORY_CARE_PROVIDER_SITE_OTHER): Payer: Medicare Other | Admitting: Family

## 2015-04-14 ENCOUNTER — Ambulatory Visit (INDEPENDENT_AMBULATORY_CARE_PROVIDER_SITE_OTHER): Payer: Medicare Other

## 2015-04-14 VITALS — BP 131/73 | HR 99 | Temp 97.0°F

## 2015-04-14 DIAGNOSIS — R053 Chronic cough: Secondary | ICD-10-CM

## 2015-04-14 DIAGNOSIS — R05 Cough: Secondary | ICD-10-CM | POA: Diagnosis not present

## 2015-04-14 DIAGNOSIS — R300 Dysuria: Secondary | ICD-10-CM

## 2015-04-14 DIAGNOSIS — I639 Cerebral infarction, unspecified: Secondary | ICD-10-CM | POA: Diagnosis not present

## 2015-04-14 DIAGNOSIS — I10 Essential (primary) hypertension: Secondary | ICD-10-CM | POA: Diagnosis not present

## 2015-04-14 LAB — POCT URINALYSIS DIPSTICK
Bilirubin, UA: NEGATIVE
Glucose, UA: NEGATIVE
Ketones, UA: NEGATIVE
Nitrite, UA: NEGATIVE
PH UA: 7
PROTEIN UA: NEGATIVE
Spec Grav, UA: 1.01
Urobilinogen, UA: NEGATIVE

## 2015-04-14 LAB — POCT UA - MICROSCOPIC ONLY
BACTERIA, U MICROSCOPIC: NEGATIVE
Casts, Ur, LPF, POC: NEGATIVE
Crystals, Ur, HPF, POC: NEGATIVE
Mucus, UA: NEGATIVE
Yeast, UA: NEGATIVE

## 2015-04-14 MED ORDER — LOSARTAN POTASSIUM 50 MG PO TABS: 50.0000 mg | ORAL_TABLET | Freq: Every day | ORAL | Status: DC

## 2015-04-14 MED ORDER — CIPROFLOXACIN HCL 250 MG PO TABS: 250.0000 mg | ORAL_TABLET | Freq: Two times a day (BID) | ORAL | Status: DC

## 2015-04-14 NOTE — Patient Instructions (Addendum)
Health Maintenance Adopting a healthy lifestyle and getting preventive care can go a long way to promote health and wellness. Talk with your health care provider about what schedule of regular examinations is right for you. This is a good chance for you to check in with your provider about disease prevention and staying healthy. In between checkups, there are plenty of things you can do on your own. Experts have done a lot of research about which lifestyle changes and preventive measures are most likely to keep you healthy. Ask your health care provider for more information. WEIGHT AND DIET  Eat a healthy diet  Be sure to include plenty of vegetables, fruits, low-fat dairy products, and lean protein.  Do not eat a lot of foods high in solid fats, added sugars, or salt.  Get regular exercise. This is one of the most important things you can do for your health.  Most adults should exercise for at least 150 minutes each week. The exercise should increase your heart rate and make you sweat (moderate-intensity exercise).  Most adults should also do strengthening exercises at least twice a week. This is in addition to the moderate-intensity exercise.  Maintain a healthy weight  Body mass index (BMI) is a measurement that can be used to identify possible weight problems. It estimates body fat based on height and weight. Your health care provider can help determine your BMI and help you achieve or maintain a healthy weight.  For females 46 years of age and older:   A BMI below 18.5 is considered underweight.  A BMI of 18.5 to 24.9 is normal.  A BMI of 25 to 29.9 is considered overweight.  A BMI of 30 and above is considered obese.  Watch levels of cholesterol and blood lipids  You should start having your blood tested for lipids and cholesterol at 79 years of age, then have this test every 5 years.  You may need to have your cholesterol levels checked more often if:  Your lipid or  cholesterol levels are high.  You are older than 79 years of age.  You are at high risk for heart disease.  CANCER SCREENING   Lung Cancer  Lung cancer screening is recommended for adults 64-99 years old who are at high risk for lung cancer because of a history of smoking.  A yearly low-dose CT scan of the lungs is recommended for people who:  Currently smoke.  Have quit within the past 15 years.  Have at least a 30-pack-year history of smoking. A pack year is smoking an average of one pack of cigarettes a day for 1 year.  Yearly screening should continue until it has been 15 years since you quit.  Yearly screening should stop if you develop a health problem that would prevent you from having lung cancer treatment.  Breast Cancer  Practice breast self-awareness. This means understanding how your breasts normally appear and feel.  It also means doing regular breast self-exams. Let your health care provider know about any changes, no matter how small.  If you are in your 20s or 30s, you should have a clinical breast exam (CBE) by a health care provider every 1-3 years as part of a regular health exam.  If you are 31 or older, have a CBE every year. Also consider having a breast X-ray (mammogram) every year.  If you have a family history of breast cancer, talk to your health care provider about genetic screening.  If you are  at high risk for breast cancer, talk to your health care provider about having an MRI and a mammogram every year.  Breast cancer gene (BRCA) assessment is recommended for women who have family members with BRCA-related cancers. BRCA-related cancers include:  Breast.  Ovarian.  Tubal.  Peritoneal cancers.  Results of the assessment will determine the need for genetic counseling and BRCA1 and BRCA2 testing. Cervical Cancer Routine pelvic examinations to screen for cervical cancer are no longer recommended for nonpregnant women who are considered low  risk for cancer of the pelvic organs (ovaries, uterus, and vagina) and who do not have symptoms. A pelvic examination may be necessary if you have symptoms including those associated with pelvic infections. Ask your health care provider if a screening pelvic exam is right for you.   The Pap test is the screening test for cervical cancer for women who are considered at risk.  If you had a hysterectomy for a problem that was not cancer or a condition that could lead to cancer, then you no longer need Pap tests.  If you are older than 65 years, and you have had normal Pap tests for the past 10 years, you no longer need to have Pap tests.  If you have had past treatment for cervical cancer or a condition that could lead to cancer, you need Pap tests and screening for cancer for at least 20 years after your treatment.  If you no longer get a Pap test, assess your risk factors if they change (such as having a new sexual partner). This can affect whether you should start being screened again.  Some women have medical problems that increase their chance of getting cervical cancer. If this is the case for you, your health care provider may recommend more frequent screening and Pap tests.  The human papillomavirus (HPV) test is another test that may be used for cervical cancer screening. The HPV test looks for the virus that can cause cell changes in the cervix. The cells collected during the Pap test can be tested for HPV.  The HPV test can be used to screen women 56 years of age and older. Getting tested for HPV can extend the interval between normal Pap tests from three to five years.  An HPV test also should be used to screen women of any age who have unclear Pap test results.  After 79 years of age, women should have HPV testing as often as Pap tests.  Colorectal Cancer  This type of cancer can be detected and often prevented.  Routine colorectal cancer screening usually begins at 79 years of  age and continues through 79 years of age.  Your health care provider may recommend screening at an earlier age if you have risk factors for colon cancer.  Your health care provider may also recommend using home test kits to check for hidden blood in the stool.  A small camera at the end of a tube can be used to examine your colon directly (sigmoidoscopy or colonoscopy). This is done to check for the earliest forms of colorectal cancer.  Routine screening usually begins at age 57.  Direct examination of the colon should be repeated every 5-10 years through 79 years of age. However, you may need to be screened more often if early forms of precancerous polyps or small growths are found. Skin Cancer  Check your skin from head to toe regularly.  Tell your health care provider about any new moles or changes in  moles, especially if there is a change in a mole's shape or color.  Also tell your health care provider if you have a mole that is larger than the size of a pencil eraser.  Always use sunscreen. Apply sunscreen liberally and repeatedly throughout the day.  Protect yourself by wearing long sleeves, pants, a wide-brimmed hat, and sunglasses whenever you are outside. HEART DISEASE, DIABETES, AND HIGH BLOOD PRESSURE   Have your blood pressure checked at least every 1-2 years. High blood pressure causes heart disease and increases the risk of stroke.  If you are between 75 years and 42 years old, ask your health care provider if you should take aspirin to prevent strokes.  Have regular diabetes screenings. This involves taking a blood sample to check your fasting blood sugar level.  If you are at a normal weight and have a low risk for diabetes, have this test once every three years after 79 years of age.  If you are overweight and have a high risk for diabetes, consider being tested at a younger age or more often. PREVENTING INFECTION  Hepatitis B  If you have a higher risk for  hepatitis B, you should be screened for this virus. You are considered at high risk for hepatitis B if:  You were born in a country where hepatitis B is common. Ask your health care provider which countries are considered high risk.  Your parents were born in a high-risk country, and you have not been immunized against hepatitis B (hepatitis B vaccine).  You have HIV or AIDS.  You use needles to inject street drugs.  You live with someone who has hepatitis B.  You have had sex with someone who has hepatitis B.  You get hemodialysis treatment.  You take certain medicines for conditions, including cancer, organ transplantation, and autoimmune conditions. Hepatitis C  Blood testing is recommended for:  Everyone born from [birth year] through 1963.  Anyone with known risk factors for hepatitis C. Sexually transmitted infections (STIs)  You should be screened for sexually transmitted infections (STIs) including gonorrhea and chlamydia if:  You are sexually active and are younger than 79 years of age.  You are older than 79 years of age and your health care provider tells you that you are at risk for this type of infection.  Your sexual activity has changed since you were last screened and you are at an increased risk for chlamydia or gonorrhea. Ask your health care provider if you are at risk.  If you do not have HIV, but are at risk, it may be recommended that you take a prescription medicine daily to prevent HIV infection. This is called pre-exposure prophylaxis (PrEP). You are considered at risk if:  You are sexually active and do not regularly use condoms or know the HIV status of your partner(s).  You take drugs by injection.  You are sexually active with a partner who has HIV. Talk with your health care provider about whether you are at high risk of being infected with HIV. If you choose to begin PrEP, you should first be tested for HIV. You should then be tested every 3 months for  as long as you are taking PrEP.  PREGNANCY   If you are premenopausal and you may become pregnant, ask your health care provider about preconception counseling.  If you may become pregnant, take 400 to 800 micrograms (mcg) of folic acid every day.  If you want to prevent pregnancy, talk to your  health care provider about birth control (contraception). OSTEOPOROSIS AND MENOPAUSE   Osteoporosis is a disease in which the bones lose minerals and strength with aging. This can result in serious bone fractures. Your risk for osteoporosis can be identified using a bone density scan.  If you are 75 years of age or older, or if you are at risk for osteoporosis and fractures, ask your health care provider if you should be screened.  Ask your health care provider whether you should take a calcium or vitamin D supplement to lower your risk for osteoporosis.  Menopause may have certain physical symptoms and risks.  Hormone replacement therapy may reduce some of these symptoms and risks. Talk to your health care provider about whether hormone replacement therapy is right for you.  HOME CARE INSTRUCTIONS   Schedule regular health, dental, and eye exams.  Stay current with your immunizations.   Do not use any tobacco products including cigarettes, chewing tobacco, or electronic cigarettes.  If you are pregnant, do not drink alcohol.  If you are breastfeeding, limit how much and how often you drink alcohol.  Limit alcohol intake to no more than 1 drink per day for nonpregnant women. One drink equals 12 ounces of beer, 5 ounces of wine, or 1 ounces of hard liquor.  Do not use street drugs.  Do not share needles.  Ask your health care provider for help if you need support or information about quitting drugs.  Tell your health care provider if you often feel depressed.  Tell your health care provider if you have ever been abused or do not feel safe at home. Document Released: 01/29/2011  Document Revised: 11/30/2013 Document Reviewed: 06/17/2013 [facility] Patient Information 2015 [place], [place]. This information is not intended to replace advice given to you by your health care provider. Make sure you discuss any questions you have with your health care provider. Cough, Adult  A cough is a reflex that helps clear your throat and airways. It can help heal the body or may be a reaction to an irritated airway. A cough may only last 2 or 3 weeks (acute) or may last more than 8 weeks (chronic).  CAUSES Acute cough:  Viral or bacterial infections. Chronic cough:  Infections.  Allergies.  Asthma.  Post-nasal drip.  Smoking.  Heartburn or acid reflux.  Some medicines.  Chronic lung problems (COPD).  Cancer. SYMPTOMS   Cough.  Fever.  Chest pain.  Increased breathing rate.  High-pitched whistling sound when breathing (wheezing).  Colored mucus that you cough up (sputum). TREATMENT   A bacterial cough may be treated with antibiotic medicine.  A viral cough must run its course and will not respond to antibiotics.  Your caregiver may recommend other treatments if you have a chronic cough. HOME CARE INSTRUCTIONS   Only take over-the-counter or prescription medicines for pain, discomfort, or fever as directed by your caregiver. Use cough suppressants only as directed by your caregiver.  Use a cold steam vaporizer or humidifier in your bedroom or home to help loosen secretions.  Sleep in a semi-upright position if your cough is worse at night.  Rest as needed.  Stop smoking if you smoke. SEEK IMMEDIATE MEDICAL CARE IF:   You have pus in your sputum.  Your cough starts to worsen.  You cannot control your cough with suppressants and are losing sleep.  You begin coughing up blood.  You have difficulty breathing.  You develop pain which is getting worse or is uncontrolled  with medicine.  You have a fever. MAKE SURE YOU:   Understand these  instructions.  Will watch your condition.  Will get help right away if you are not doing well or get worse. Document Released: 01/12/2011 Document Revised: 10/08/2011 Document Reviewed: 01/12/2011 [facility] Patient Information 2015 [place], [place]. This information is not intended to replace advice given to you by your health care provider. Make sure you discuss any questions you have with your health care provider.

## 2015-04-14 NOTE — Addendum Note (Signed)
Addended by: Pollyann Kennedy F on: 04/14/2015 04:04 PM   Modules accepted: Orders

## 2015-04-14 NOTE — Progress Notes (Signed)
Subjective:    Patient ID: [name], female    DOB: 1933/11/29, 81 y.o.   MRN: [id number]  Pt presents to the office today to recheck UTI infection. Pt states she is feeling better. Pt states she finished her antibiotics. Pt states she urinated before leaving the house and is unable to give [place] a sample today.  Pt is also, complaining of a recurrent produtive cough. Pt states she has had this cough  Since June and was treated with steroid and felt better.  Pt states that after finishing the prednisone she started coughing again.  Pt states she has been on the lisinopril for years and states she had never coughed until this winter.  Dysuria  Pertinent negatives include no chills.  Cough This is a recurrent problem. The current episode started more than 1 month ago. The problem has been unchanged. The problem occurs every few minutes. The cough is productive of sputum. Associated symptoms include shortness of breath and wheezing. Pertinent negatives include no chills, ear congestion, ear pain, fever, headaches, nasal congestion, postnasal drip, rhinorrhea or sore throat. The symptoms are aggravated by lying down. She has tried rest for the symptoms. The treatment provided mild relief. There is no history of asthma or COPD.      Review of Systems  Constitutional: Negative.  Negative for fever and chills.  HENT: Negative.  Negative for ear pain, postnasal drip, rhinorrhea and sore throat.   Eyes: Negative.   Respiratory: Positive for cough, shortness of breath and wheezing.   Cardiovascular: Negative.  Negative for palpitations.  Gastrointestinal: Negative.   Endocrine: Negative.   Genitourinary: Positive for dysuria.  Musculoskeletal: Negative.   Neurological: Negative.  Negative for headaches.  Hematological: Negative.   Psychiatric/Behavioral: Negative.   All other systems reviewed and are negative.      Objective:   Physical Exam  Constitutional: She is oriented to person,  place, and time. She appears well-developed and well-nourished. No distress.  HENT:  Head: Normocephalic and atraumatic.  Right Ear: External ear normal.  Left Ear: External ear normal.  Nose: Nose normal.  Mouth/Throat: Oropharynx is clear and moist.  Eyes: Pupils are equal, round, and reactive to light.  Neck: Normal range of motion. Neck supple. No thyromegaly present.  Cardiovascular: Normal rate, regular rhythm, normal heart sounds and intact distal pulses.   No murmur heard. Pulmonary/Chest: Effort normal and breath sounds normal. No respiratory distress. She has no wheezes.  Abdominal: Soft. Bowel sounds are normal. She exhibits no distension. There is no tenderness.  Musculoskeletal: Normal range of motion. She exhibits no edema or tenderness.  Neurological: She is alert and oriented to person, place, and time. She has normal reflexes. No cranial nerve deficit.  Skin: Skin is warm and dry.  Psychiatric: She has a normal mood and affect. Her behavior is normal. Judgment and thought content normal.  Vitals reviewed.   Chest x-ray- WNL Preliminary reading by Evelina Dun, FNP [facility]   BP 131/73 mmHg  Pulse 99  Temp(Src) 97 F (36.1 C) (Oral)  Wt      Assessment & Plan:  1. Dysuria -PT to try to leave sample- If not will bring back a sample - POCT urinalysis dipstick - POCT UA - Microscopic Only  2. Chronic cough -Lisinopril stopped today -Continue tessalon pearls -Mucinex BID and Flonase RTO in 2 weeks - DG Chest 2 View; Future - losartan (COZAAR) 50 MG tablet; Take 1 tablet (50 mg total) by mouth daily.  Dispense:  90 tablet; Refill: 3  3. Essential hypertension Pt's Lisinopril stopped today and pt started on Losartan today -Dash diet information given -Exercise encouraged - Stress Management  -Continue current meds -RTO in 2 weeks  - losartan (COZAAR) 50 MG tablet; Take 1 tablet (50 mg total) by mouth daily.  Dispense: 90 tablet; Refill: 3   Continue all  meds Labs pending Health Maintenance reviewed Diet and exercise encouraged RTO 2 weeks to recheck HTN and cough  Evelina Dun, FNP

## 2015-04-16 LAB — URINE CULTURE

## 2015-04-25 ENCOUNTER — Telehealth: Payer: Self-pay | Admitting: Family Medicine

## 2015-04-25 NOTE — Telephone Encounter (Signed)
Patient aware that she needs a routine follow up to discuss the vitamin D. Patient also states that the losartan was making her sick and her BP drop to 100/48. Patient states she stopped the losartan and wants to go back on the lisinopril.

## 2015-04-27 NOTE — Telephone Encounter (Signed)
That is fine to switch back, looks like Christy switched her from lisinopril to losartan because of the cough. If the cough has not improved since being off of lisinopril then that is not likely the cause. Caryl Pina, MD [facility] 04/27/2015, 3:23 PM

## 2015-04-27 NOTE — Telephone Encounter (Signed)
Pt called and aware of recommendation  She wants to stay on current med losartan instead of the switch back   She will monitor BP and let us know if there is any problems

## 2015-05-02 ENCOUNTER — Encounter: Payer: Self-pay | Admitting: Family

## 2015-05-02 ENCOUNTER — Ambulatory Visit (INDEPENDENT_AMBULATORY_CARE_PROVIDER_SITE_OTHER): Payer: Medicare Other | Admitting: Family

## 2015-05-02 ENCOUNTER — Encounter: Payer: Self-pay | Admitting: Internal Medicine

## 2015-05-02 VITALS — BP 128/77 | HR 74 | Temp 96.9°F

## 2015-05-02 DIAGNOSIS — I639 Cerebral infarction, unspecified: Secondary | ICD-10-CM

## 2015-05-02 DIAGNOSIS — N3000 Acute cystitis without hematuria: Secondary | ICD-10-CM | POA: Diagnosis not present

## 2015-05-02 DIAGNOSIS — R1030 Lower abdominal pain, unspecified: Secondary | ICD-10-CM

## 2015-05-02 DIAGNOSIS — I10 Essential (primary) hypertension: Secondary | ICD-10-CM

## 2015-05-02 LAB — POCT URINALYSIS DIPSTICK
BILIRUBIN UA: NEGATIVE
GLUCOSE UA: NEGATIVE
KETONES UA: NEGATIVE
Nitrite, UA: NEGATIVE
Protein, UA: NEGATIVE
RBC UA: NEGATIVE
Urobilinogen, UA: NEGATIVE
pH, UA: 7

## 2015-05-02 LAB — POCT UA - MICROSCOPIC ONLY
Bacteria, U Microscopic: NEGATIVE
Casts, Ur, LPF, POC: NEGATIVE
Crystals, Ur, HPF, POC: NEGATIVE
Mucus, UA: NEGATIVE
RBC, urine, microscopic: NEGATIVE
Yeast, UA: NEGATIVE

## 2015-05-02 MED ORDER — NITROFURANTOIN MONOHYD MACRO 100 MG PO CAPS: 100.0000 mg | ORAL_CAPSULE | Freq: Two times a day (BID) | ORAL | Status: DC

## 2015-05-02 NOTE — Patient Instructions (Addendum)
Hypertension Hypertension, commonly called high blood pressure, is when the force of blood pumping through your arteries is too strong. Your arteries are the blood vessels that carry blood from your heart throughout your body. A blood pressure reading consists of a higher number over a lower number, such as 110/72. The higher number (systolic) is the pressure inside your arteries when your heart pumps. The lower number (diastolic) is the pressure inside your arteries when your heart relaxes. Ideally you want your blood pressure below 120/80. Hypertension forces your heart to work harder to pump blood. Your arteries may become narrow or stiff. Having hypertension puts you at risk for heart disease, stroke, and other problems.  RISK FACTORS Some risk factors for high blood pressure are controllable. Others are not.  Risk factors you cannot control include:   Race. You may be at higher risk if you are African American.  Age. Risk increases with age.  Gender. Men are at higher risk than women before age 45 years. After age 65, women are at higher risk than men. Risk factors you can control include:  Not getting enough exercise or physical activity.  Being overweight.  Getting too much fat, sugar, calories, or salt in your diet.  Drinking too much alcohol. SIGNS AND SYMPTOMS Hypertension does not usually cause signs or symptoms. Extremely high blood pressure (hypertensive crisis) may cause headache, anxiety, shortness of breath, and nosebleed. DIAGNOSIS  To check if you have hypertension, your health care provider will measure your blood pressure while you are seated, with your arm held at the level of your heart. It should be measured at least twice using the same arm. Certain conditions can cause a difference in blood pressure between your right and left arms. A blood pressure reading that is higher than normal on one occasion does not mean that you need treatment. If one blood pressure reading  is high, ask your health care provider about having it checked again. TREATMENT  Treating high blood pressure includes making lifestyle changes and possibly taking medicine. Living a healthy lifestyle can help lower high blood pressure. You may need to change some of your habits. Lifestyle changes may include:  Following the DASH diet. This diet is high in fruits, vegetables, and whole grains. It is low in salt, red meat, and added sugars.  Getting at least 2 hours of brisk physical activity every week.  Losing weight if necessary.  Not smoking.  Limiting alcoholic beverages.  Learning ways to reduce stress. If lifestyle changes are not enough to get your blood pressure under control, your health care provider may prescribe medicine. You may need to take more than one. Work closely with your health care provider to understand the risks and benefits. HOME CARE INSTRUCTIONS  Have your blood pressure rechecked as directed by your health care provider.   Take medicines only as directed by your health care provider. Follow the directions carefully. Blood pressure medicines must be taken as prescribed. The medicine does not work as well when you skip doses. Skipping doses also puts you at risk for problems.   Do not smoke.   Monitor your blood pressure at home as directed by your health care provider. SEEK MEDICAL CARE IF:   You think you are having a reaction to medicines taken.  You have recurrent headaches or feel dizzy.  You have swelling in your ankles.  You have trouble with your vision. SEEK IMMEDIATE MEDICAL CARE IF:  You develop a severe headache or confusion.    You have unusual weakness, numbness, or feel faint.  You have severe chest or abdominal pain.  You vomit repeatedly.  You have trouble breathing. MAKE SURE YOU:   Understand these instructions.  Will watch your condition.  Will get help right away if you are not doing well or get worse. Document  Released: 07/16/2005 Document Revised: 11/30/2013 Document Reviewed: 05/08/2013 [facility] Patient Information 2015 [place], [place]. This information is not intended to replace advice given to you by your health care provider. Make sure you discuss any questions you have with your health care provider. Urinary Tract Infection Urinary tract infections (UTIs) can develop anywhere along your urinary tract. Your urinary tract is your body's drainage system for removing wastes and extra water. Your urinary tract includes two kidneys, two ureters, a bladder, and a urethra. Your kidneys are a pair of bean-shaped organs. Each kidney is about the size of your fist. They are located below your ribs, one on each side of your spine. CAUSES Infections are caused by microbes, which are microscopic organisms, including fungi, viruses, and bacteria. These organisms are so small that they can only be seen through a microscope. Bacteria are the microbes that most commonly cause UTIs. SYMPTOMS  Symptoms of UTIs may vary by age and gender of the patient and by the location of the infection. Symptoms in young women typically include a frequent and intense urge to urinate and a painful, burning feeling in the bladder or urethra during urination. Older women and men are more likely to be tired, shaky, and weak and have muscle aches and abdominal pain. A fever may mean the infection is in your kidneys. Other symptoms of a kidney infection include pain in your back or sides below the ribs, nausea, and vomiting. DIAGNOSIS To diagnose a UTI, your caregiver will ask you about your symptoms. Your caregiver also will ask to provide a urine sample. The urine sample will be tested for bacteria and white blood cells. White blood cells are made by your body to help fight infection. TREATMENT  Typically, UTIs can be treated with medication. Because most UTIs are caused by a bacterial infection, they usually can be treated with the use of  antibiotics. The choice of antibiotic and length of treatment depend on your symptoms and the type of bacteria causing your infection. HOME CARE INSTRUCTIONS  If you were prescribed antibiotics, take them exactly as your caregiver instructs you. Finish the medication even if you feel better after you have only taken some of the medication.  Drink enough water and fluids to keep your urine clear or pale yellow.  Avoid caffeine, tea, and carbonated beverages. They tend to irritate your bladder.  Empty your bladder often. Avoid holding urine for long periods of time.  Empty your bladder before and after sexual intercourse.  After a bowel movement, women should cleanse from front to back. Use each tissue only once. SEEK MEDICAL CARE IF:   You have back pain.  You develop a fever.  Your symptoms do not begin to resolve within 3 days. SEEK IMMEDIATE MEDICAL CARE IF:   You have severe back pain or lower abdominal pain.  You develop chills.  You have nausea or vomiting.  You have continued burning or discomfort with urination. MAKE SURE YOU:   Understand these instructions.  Will watch your condition.  Will get help right away if you are not doing well or get worse. Document Released: 04/25/2005 Document Revised: 01/15/2012 Document Reviewed: 08/24/2011 [employer] Patient Information 2015  [employer]. This information is not intended to replace advice given to you by your health care provider. Make sure you discuss any questions you have with your health care provider.

## 2015-05-02 NOTE — Progress Notes (Signed)
   Subjective:    Patient ID: [name], female    DOB: 03/16/1934, 81 y.o.   MRN: [id number]     HPI Pt presents to the office today to recheck UTI and HTN.  Pt was seen in the office and placed on Cipro. Pt's BP medication was changed on last visit because of a cough from her Lisinopril. Pt was started on Losartan 50 mg. Pt states she is no longer coughing and reports feeling better. Pt denies any headache, palpitations, SOB, or edema at this time.   Review of Systems  Constitutional: Negative.   HENT: Negative.   Eyes: Negative.   Respiratory: Negative.  Negative for shortness of breath.   Cardiovascular: Negative.  Negative for palpitations.  Gastrointestinal: Negative.   Endocrine: Negative.   Genitourinary: Negative.   Musculoskeletal: Negative.   Neurological: Negative.  Negative for headaches.  Hematological: Negative.   Psychiatric/Behavioral: Negative.   All other systems reviewed and are negative.      Objective:   Physical Exam  Constitutional: She is oriented to person, place, and time. She appears well-developed and well-nourished. No distress.  HENT:  Head: Normocephalic and atraumatic.  Right Ear: External ear normal.  Left Ear: External ear normal.  Nose: Nose normal.  Mouth/Throat: Oropharynx is clear and moist.  Eyes: Pupils are equal, round, and reactive to light.  Neck: Normal range of motion. Neck supple. No thyromegaly present.  Cardiovascular: Normal rate, regular rhythm, normal heart sounds and intact distal pulses.   No murmur heard. Pulmonary/Chest: Effort normal. No respiratory distress. She has no wheezes.  Diminished breath sounds bilaterally   Abdominal: Soft. Bowel sounds are normal. She exhibits no distension. There is no tenderness.  Musculoskeletal: Normal range of motion. She exhibits no edema or tenderness.  Negative for CVA tenderness   Neurological: She is alert and oriented to person, place, and time. She has normal reflexes.  No cranial nerve deficit.  Skin: Skin is warm and dry.  Psychiatric: She has a normal mood and affect. Her behavior is normal. Judgment and thought content normal.  Vitals reviewed.   BP 128/77 mmHg  Pulse 74  Temp(Src) 96.9 F (36.1 C) (Oral)  Wt        Assessment & Plan:  1. Lower abdominal pain - POCT urinalysis dipstick - POCT UA - Microscopic Only - Urine culture - BMP8+EGFR  2. Essential hypertension --Daily blood pressure log given with instructions on how to fill out and told to bring to next visit -Dash diet information given -Exercise encouraged - Stress Management  -Continue current meds -RTO in 3 months - BMP8+EGFR  3. Acute cystitis without hematuria -Force fluids AZO over the counter X2 days RTO prn Culture pending - nitrofurantoin, macrocrystal-monohydrate, (MACROBID) 100 MG capsule; Take 1 capsule (100 mg total) by mouth 2 (two) times daily.  Dispense: 10 capsule; Refill: 0  Evelina Dun, FNP

## 2015-05-03 ENCOUNTER — Telehealth: Payer: Self-pay | Admitting: *Deleted

## 2015-05-03 DIAGNOSIS — R911 Solitary pulmonary nodule: Secondary | ICD-10-CM

## 2015-05-03 LAB — BMP8+EGFR
BUN/Creatinine Ratio: 19 (ref 11–26)
BUN: 15 mg/dL (ref 8–27)
CHLORIDE: 96 mmol/L — AB (ref 97–108)
CO2: 25 mmol/L (ref 18–29)
CREATININE: 0.81 mg/dL (ref 0.57–1.00)
Calcium: 9.9 mg/dL (ref 8.7–10.3)
GFR calc Af Amer: 80 mL/min/{1.73_m2} (ref >59)
GFR, EST NON AFRICAN AMERICAN: 69 mL/min/{1.73_m2} (ref >59)
GLUCOSE: 112 mg/dL — AB (ref 65–99)
POTASSIUM: 5.2 mmol/L (ref 3.5–5.2)
SODIUM: 135 mmol/L (ref 134–144)

## 2015-05-03 NOTE — Telephone Encounter (Signed)
-----   Message from Tanda Rockers, MD sent at 06/02/2014  2:46 PM EST ----- Be sure she has a non contrasted CT chest before the end of the month

## 2015-05-03 NOTE — Telephone Encounter (Signed)
Spoke with the pt and notified that she is due for ct chest this month  She verbalized understanding  Order was sent to [facility]

## 2015-05-04 LAB — URINE CULTURE

## 2015-05-05 ENCOUNTER — Encounter: Payer: Self-pay | Admitting: Cardiology

## 2015-05-05 ENCOUNTER — Other Ambulatory Visit (INDEPENDENT_AMBULATORY_CARE_PROVIDER_SITE_OTHER): Payer: Medicare Other

## 2015-05-05 DIAGNOSIS — H25811 Combined forms of age-related cataract, right eye: Secondary | ICD-10-CM | POA: Diagnosis not present

## 2015-05-05 DIAGNOSIS — H353110 Nonexudative age-related macular degeneration, right eye, stage unspecified: Secondary | ICD-10-CM

## 2015-05-05 DIAGNOSIS — H25812 Combined forms of age-related cataract, left eye: Secondary | ICD-10-CM

## 2015-05-06 ENCOUNTER — Telehealth: Payer: Self-pay | Admitting: Family Medicine

## 2015-05-06 ENCOUNTER — Ambulatory Visit (INDEPENDENT_AMBULATORY_CARE_PROVIDER_SITE_OTHER): Payer: Medicare Other | Admitting: *Deleted

## 2015-05-06 DIAGNOSIS — R002 Palpitations: Secondary | ICD-10-CM | POA: Diagnosis not present

## 2015-05-06 DIAGNOSIS — H539 Unspecified visual disturbance: Secondary | ICD-10-CM

## 2015-05-06 LAB — THYROTROPIN RECEPTOR AUTOABS

## 2015-05-06 LAB — MYASTHENIA GRAVIS EVALUATION
AChR Binding Ab, Serum: <0.03 nmol/L (ref 0.00–0.24)
Anti-striation Abs: NEGATIVE

## 2015-05-06 NOTE — Progress Notes (Signed)
Loop recorder 

## 2015-05-09 NOTE — Telephone Encounter (Signed)
Please refer as requested. THanks, WS

## 2015-05-09 NOTE — Telephone Encounter (Signed)
Referral okay, please do.

## 2015-05-10 ENCOUNTER — Ambulatory Visit (INDEPENDENT_AMBULATORY_CARE_PROVIDER_SITE_OTHER)
Admission: RE | Admit: 2015-05-10 | Discharge: 2015-05-10 | Disposition: A | Discharge status: Home / Self Care | Payer: Medicare Other | Source: Ambulatory Visit | Attending: Internal Medicine | Admitting: Internal Medicine

## 2015-05-10 DIAGNOSIS — R911 Solitary pulmonary nodule: Secondary | ICD-10-CM | POA: Diagnosis not present

## 2015-05-11 ENCOUNTER — Telehealth: Payer: Self-pay | Admitting: Internal Medicine

## 2015-05-11 ENCOUNTER — Encounter: Payer: Self-pay | Admitting: Cardiology

## 2015-05-11 NOTE — Telephone Encounter (Signed)
New Message  Pt received letter saying she needed to send remote transmission- however pt stated it is always sent automatically and she does not know what she needs to do. Has scheduled remote sched for 11/7. Please call back and discuss.

## 2015-05-11 NOTE — Progress Notes (Signed)
Quick Note:  ATC, NA and no option to leave msg ______

## 2015-05-12 NOTE — Telephone Encounter (Signed)
Explained to pt why she received the letter. Informed her that the home montiors can become disconnected when there's been power outages, its been accidentally unplugged, or she's been away from the monitor for more than a few days. Pt stated that she's had power outages. Offered to help pt send a manual transmission but pt was unable to do so at this time. She will call back later or tomorrow to get help with her home monitor.

## 2015-05-12 NOTE — Telephone Encounter (Signed)
Pt called back and I instructed her how to send remote transmission. The reader to the monitor was not charged and need to be charged. Informed pt that the reader will need to charge overnight and in the morning she needs to try to send another transmission and I explained to her how to do this. Pt verbalized understanding.

## 2015-05-17 ENCOUNTER — Encounter: Payer: Self-pay | Admitting: Cardiovascular Disease

## 2015-05-17 ENCOUNTER — Other Ambulatory Visit: Payer: Self-pay

## 2015-05-17 ENCOUNTER — Ambulatory Visit (INDEPENDENT_AMBULATORY_CARE_PROVIDER_SITE_OTHER): Payer: Medicare Other | Admitting: Cardiovascular Disease

## 2015-05-17 VITALS — BP 112/76 | HR 73 | Ht 63.0 in | Wt 222.0 lb

## 2015-05-17 DIAGNOSIS — R053 Chronic cough: Secondary | ICD-10-CM

## 2015-05-17 DIAGNOSIS — I639 Cerebral infarction, unspecified: Secondary | ICD-10-CM | POA: Diagnosis not present

## 2015-05-17 DIAGNOSIS — I10 Essential (primary) hypertension: Secondary | ICD-10-CM

## 2015-05-17 DIAGNOSIS — E785 Hyperlipidemia, unspecified: Secondary | ICD-10-CM | POA: Diagnosis not present

## 2015-05-17 DIAGNOSIS — R05 Cough: Secondary | ICD-10-CM

## 2015-05-17 DIAGNOSIS — I480 Paroxysmal atrial fibrillation: Secondary | ICD-10-CM | POA: Diagnosis not present

## 2015-05-17 DIAGNOSIS — R002 Palpitations: Secondary | ICD-10-CM | POA: Diagnosis not present

## 2015-05-17 LAB — CUP PACEART REMOTE DEVICE CHECK: MDC IDC SESS DTM: 20161007113528

## 2015-05-17 MED ORDER — LOSARTAN POTASSIUM 50 MG PO TABS: 50.0000 mg | ORAL_TABLET | Freq: Every day | ORAL | Status: DC

## 2015-05-17 NOTE — Progress Notes (Signed)
Carelink summary report received. Battery status OK. Normal device function. No new symptom episodes, tachy episodes, brady, or pause episodes. No new AF episodes. Monthly summary reports and ROV with JA on 06/20/15 at 11:15am.

## 2015-05-17 NOTE — Assessment & Plan Note (Signed)
History of hyperlipidemia on lovastatin with her most recent lipid profile performed 10/12/14 revealing total cholesterol of 172.

## 2015-05-17 NOTE — Progress Notes (Signed)
05/17/2015 [name]   04/09/1934  [id number]  Primary Physician Rebekah Fraise, MD Primary Cardiologist: Rebekah Harp MD [name]   HPI:  [name] is a 81 year old moderately overweight married Caucasian female mother of 11 (son 63 years old died 47/2 years ago prostate CA), grandmother of 2 grandchildren, who was formally a patient of Dr. Georgiann Paul. I last saw her in the office 12/29/14. She has no prior cardiac history. I last saw her in the office 11/10/14. She does have a history of treated hypertension and hyperlipidemia as well as a family history of heart disease. Her father died at age 29 from a heart attack as did her brother. She has another brother who is in open-heart surgery. She has never had a heart attack but has had "mini strokes" in the past. She does get dyspnea on exertion but denies chest pain. I left cell her one year ago. She apparently had Ace sternal fracture secondary to a fall 6 months ago and had a chest CT that showed abnormalities in her lung parenchyma which has been followed by serial CT scans. She was recently admitted to, hospital for chest pain rule out MI. CT angiogram was negative for PE. A Myoview stress test was negative as well. She complains of episodes of weakness as well as palpitations. She does have blood pressure and heart rate readings with some relative bradycardia. Because of palpitations and weakness and performed a 30 day event monitor which showed predominantly sinus rhythm with episodes of sinus bradycardia, pauses up to 2 seconds and a short run of paroxysmal fibrillation with a rapid ventricular response. She is not a good oral anticoagulation patient because of frequent falls and AVMs.  Current Outpatient Prescriptions  Medication Sig Dispense Refill  . amitriptyline (ELAVIL) 50 MG tablet Take 0.5 tablets (25 mg total) by mouth at bedtime. 90 tablet 3  . amLODipine (NORVASC) 5 MG tablet Take 5 mg by mouth daily.     . benzonatate (TESSALON) 200 MG capsule Take 1 capsule (200 mg total) by mouth 3 (three) times daily as needed for cough. 30 capsule 2  . DUREZOL 0.05 % EMUL     . escitalopram (LEXAPRO) 20 MG tablet Take 1 tablet (20 mg total) by mouth at bedtime. 90 tablet 3  . furosemide (LASIX) 40 MG tablet Take 40 mg by mouth daily as needed.    . gabapentin (NEURONTIN) 300 MG capsule Take 1 capsule (300 mg total) by mouth 3 (three) times daily. 270 capsule 3  . gentamicin (GARAMYCIN) 0.3 % ophthalmic solution     . levothyroxine (SYNTHROID, LEVOTHROID) 75 MCG tablet Take 1 tablet (75 mcg total) by mouth daily before breakfast. 90 tablet 2  . losartan (COZAAR) 50 MG tablet Take 1 tablet (50 mg total) by mouth daily. 90 tablet 3  . lovastatin (MEVACOR) 40 MG tablet Take 1 tablet (40 mg total) by mouth at bedtime. 90 tablet 3  . nitrofurantoin, macrocrystal-monohydrate, (MACROBID) 100 MG capsule Take 1 capsule (100 mg total) by mouth 2 (two) times daily. 10 capsule 0  . omeprazole (PRILOSEC) 40 MG capsule Take 1 capsule (40 mg total) by mouth daily. 90 capsule 3  . traZODone (DESYREL) 150 MG tablet 1/2 to 1 at bedtime as needed for sleep 90 tablet 2  . vitamin B-12 (CYANOCOBALAMIN) 1000 MCG tablet Take 1,000 mcg by mouth daily.     No current facility-administered medications for this visit.    Allergies  Allergen Reactions  .  Lisinopril     Cough ?    Social History   Social History  . Marital Status: Married    Spouse Name: N/A  . Number of Children: N/A  . Years of Education: N/A   Occupational History  . Not on file.   Social History Main Topics  . Smoking status: Former Smoker -- 0.25 packs/day for 10 years    Types: Cigarettes    Quit date: 07/31/1975  . Smokeless tobacco: Never Used  . Alcohol Use: Yes     Comment: 2 glasses a year  . Drug Use: No  . Sexual Activity: Not on file   Other Topics Concern  . Not on file   Social History Narrative     Review of  Systems: General: negative for chills, fever, night sweats or weight changes.  Cardiovascular: negative for chest pain, dyspnea on exertion, edema, orthopnea, palpitations, paroxysmal nocturnal dyspnea or shortness of breath Dermatological: negative for rash Respiratory: negative for cough or wheezing Urologic: negative for hematuria Abdominal: negative for nausea, vomiting, diarrhea, bright red blood per rectum, melena, or hematemesis Neurologic: negative for visual changes, syncope, or dizziness All other systems reviewed and are otherwise negative except as noted above.    Blood pressure 112/76, pulse 73, height '5\' 3"'$  (1.6 m), weight 222 lb (100.699 kg).  General appearance: alert and no distress Neck: no adenopathy, no carotid bruit, no JVD, supple, symmetrical, trachea midline and thyroid not enlarged, symmetric, no tenderness/mass/nodules Lungs: clear to auscultation bilaterally Heart: regular rate and rhythm, S1, S2 normal, no murmur, click, rub or gallop Extremities: extremities normal, atraumatic, no cyanosis or edema  EKG sinus rhythm at 73 without ST or T-wave changes. I personally reviewed this EKG  ASSESSMENT AND PLAN:   Paroxysmal atrial fibrillation ([name]) History of PAF not thought to be 8 anticoagulation candidate because of frequent falls and AVMs  Palpitation History of palpitation status post loop recorder implantation by Dr. Thompson Paul who follows this as an outpatient  Hyperlipemia History of hyperlipidemia on lovastatin with her most recent lipid profile performed 10/12/14 revealing total cholesterol of 172.  Essential hypertension History of hypertension blood pressure measured at 112/76. She is on amlodipine and losartan. Continue current meds at current dosing      Rebekah Harp MD [name], [name] 05/17/2015 2:18 PM

## 2015-05-17 NOTE — Assessment & Plan Note (Signed)
History of hypertension blood pressure measured at 112/76. She is on amlodipine and losartan. Continue current meds at current dosing

## 2015-05-17 NOTE — Assessment & Plan Note (Signed)
History of palpitation status post loop recorder implantation by Dr. Thompson Grayer who follows this as an outpatient

## 2015-05-17 NOTE — Assessment & Plan Note (Signed)
History of PAF not thought to be 8 anticoagulation candidate because of frequent falls and AVMs

## 2015-05-17 NOTE — Patient Instructions (Signed)
Medication Instructions:  Your physician recommends that you continue on your current medications as directed. Please refer to the Current Medication list given to you today.  Labwork: none  Testing/Procedures: none  Follow-Up: Your physician wants you to follow-up in: 12 months with Dr. Gwenlyn Found. You will receive a reminder letter in the mail two months in advance. If you don't receive a letter, please call our office to schedule the follow-up appointment.    Any Other Special Instructions Will Be Listed Below (If Applicable).

## 2015-05-19 ENCOUNTER — Other Ambulatory Visit: Payer: Self-pay | Admitting: Ophthalmology

## 2015-05-19 ENCOUNTER — Telehealth: Payer: Self-pay | Admitting: Internal Medicine

## 2015-05-19 DIAGNOSIS — H532 Diplopia: Secondary | ICD-10-CM

## 2015-05-19 NOTE — Telephone Encounter (Signed)
Informed pt the letter she received was sent out on 05-11-15 and that the problem was resolved on 05-12-15 and that she can disregard letter. Pt verbalized understanding.

## 2015-05-19 NOTE — Telephone Encounter (Signed)
New Message      Pt calling stating she keeps getting letters stating we aren't receiving her transmissions and she wants to know what is going on because it is suppose to be automatic. Please call back and advise.

## 2015-05-24 ENCOUNTER — Ambulatory Visit (INDEPENDENT_AMBULATORY_CARE_PROVIDER_SITE_OTHER): Payer: Medicare Other | Admitting: Family

## 2015-05-24 ENCOUNTER — Encounter: Payer: Self-pay | Admitting: Family

## 2015-05-24 VITALS — BP 131/79 | HR 74 | Temp 97.0°F

## 2015-05-24 DIAGNOSIS — Z87440 Personal history of urinary (tract) infections: Secondary | ICD-10-CM | POA: Diagnosis not present

## 2015-05-24 DIAGNOSIS — I639 Cerebral infarction, unspecified: Secondary | ICD-10-CM

## 2015-05-24 DIAGNOSIS — J309 Allergic rhinitis, unspecified: Secondary | ICD-10-CM | POA: Diagnosis not present

## 2015-05-24 DIAGNOSIS — Z0189 Encounter for other specified special examinations: Secondary | ICD-10-CM

## 2015-05-24 DIAGNOSIS — Z23 Encounter for immunization: Secondary | ICD-10-CM

## 2015-05-24 LAB — POCT URINALYSIS DIPSTICK
Bilirubin, UA: NEGATIVE
Blood, UA: NEGATIVE
Glucose, UA: NEGATIVE
KETONES UA: NEGATIVE
LEUKOCYTES UA: NEGATIVE
Nitrite, UA: NEGATIVE
PROTEIN UA: NEGATIVE
Spec Grav, UA: <=1.005
Urobilinogen, UA: NEGATIVE
pH, UA: 7.5

## 2015-05-24 LAB — POCT UA - MICROSCOPIC ONLY
CASTS, UR, LPF, POC: NEGATIVE
Crystals, Ur, HPF, POC: NEGATIVE
MUCUS UA: NEGATIVE
RBC, urine, microscopic: NEGATIVE
WBC, Ur, HPF, POC: NEGATIVE
Yeast, UA: NEGATIVE

## 2015-05-24 MED ORDER — FLUTICASONE PROPIONATE 50 MCG/ACT NA SUSP: 2.0000 | Freq: Every day | NASAL | Status: DC

## 2015-05-24 NOTE — Progress Notes (Signed)
   Subjective:    Patient ID: [name], female    DOB: 07-Oct-1933, 81 y.o.   MRN: [id number]  HPI Pt presents to the office today to recheck UTI. Pt was given macrobid and bactrim over the past month. Pt states she is feeling much better and denies any dysuria, hematuria, or foul odor of urine. Pt states she would like her flu vaccine today.    Review of Systems  Constitutional: Negative.   HENT: Negative.   Eyes: Negative.   Respiratory: Negative.  Negative for shortness of breath.   Cardiovascular: Negative.  Negative for palpitations.  Gastrointestinal: Negative.   Endocrine: Negative.   Genitourinary: Negative.   Musculoskeletal: Negative.   Neurological: Negative.  Negative for headaches.  Hematological: Negative.   Psychiatric/Behavioral: Negative.   All other systems reviewed and are negative.      Objective:   Physical Exam  Constitutional: She is oriented to person, place, and time. She appears well-developed and well-nourished. No distress.  HENT:  Head: Normocephalic and atraumatic.  Right Ear: External ear normal.  Left Ear: External ear normal.  Nasal passage erythemas with mild swelling  Oropharynx erythemas   Eyes: Pupils are equal, round, and reactive to light.  Neck: Normal range of motion. Neck supple. No thyromegaly present.  Cardiovascular: Normal rate, regular rhythm, normal heart sounds and intact distal pulses.   No murmur heard. Pulmonary/Chest: Effort normal and breath sounds normal. No respiratory distress. She has no wheezes.  Abdominal: Soft. Bowel sounds are normal. She exhibits no distension. There is no tenderness.  Musculoskeletal: Normal range of motion. She exhibits no edema or tenderness.  Neurological: She is alert and oriented to person, place, and time. She has normal reflexes. No cranial nerve deficit.  Skin: Skin is warm and dry.  Psychiatric: She has a normal mood and affect. Her behavior is normal. Judgment and thought content  normal.  Vitals reviewed.   BP 131/79 mmHg  Pulse 74  Temp(Src) 97 F (36.1 C) (Oral)  Wt        Assessment & Plan:  1. Visit for urine test -Force fluids RTO prn - POCT urinalysis dipstick - POCT UA - Microscopic Only  2. Allergic rhinitis, unspecified allergic rhinitis type -Pt to take Flonase everyday -Avoid allergens when possible - fluticasone (FLONASE) 50 MCG/ACT nasal spray; Place 2 sprays into both nostrils daily.  Dispense: 16 g; Refill: 6   Flu and Prevnar given today  Evelina Dun, FNP

## 2015-05-24 NOTE — Patient Instructions (Signed)
Allergic Rhinitis Allergic rhinitis is when the mucous membranes in the nose respond to allergens. Allergens are particles in the air that cause your body to have an allergic reaction. This causes you to release allergic antibodies. Through a chain of events, these eventually cause you to release histamine into the blood stream. Although meant to protect the body, it is this release of histamine that causes your discomfort, such as frequent sneezing, congestion, and an itchy, runny nose.  CAUSES Seasonal allergic rhinitis (hay fever) is caused by pollen allergens that may come from grasses, trees, and weeds. Year-round allergic rhinitis (perennial allergic rhinitis) is caused by allergens such as house dust mites, pet dander, and mold spores. SYMPTOMS  Nasal stuffiness (congestion).  Itchy, runny nose with sneezing and tearing of the eyes. DIAGNOSIS Your health care provider can help you determine the allergen or allergens that trigger your symptoms. If you and your health care provider are unable to determine the allergen, skin or blood testing may be used. Your health care provider will diagnose your condition after taking your health history and performing a physical exam. Your health care provider may assess you for other related conditions, such as asthma, pink eye, or an ear infection. TREATMENT Allergic rhinitis does not have a cure, but it can be controlled by:  Medicines that block allergy symptoms. These may include allergy shots, nasal sprays, and oral antihistamines.  Avoiding the allergen. Hay fever may often be treated with antihistamines in pill or nasal spray forms. Antihistamines block the effects of histamine. There are over-the-counter medicines that may help with nasal congestion and swelling around the eyes. Check with your health care provider before taking or giving this medicine. If avoiding the allergen or the medicine prescribed do not work, there are many new medicines  your health care provider can prescribe. Stronger medicine may be used if initial measures are ineffective. Desensitizing injections can be used if medicine and avoidance does not work. Desensitization is when a patient is given ongoing shots until the body becomes less sensitive to the allergen. Make sure you follow up with your health care provider if problems continue. HOME CARE INSTRUCTIONS It is not possible to completely avoid allergens, but you can reduce your symptoms by taking steps to limit your exposure to them. It helps to know exactly what you are allergic to so that you can avoid your specific triggers. SEEK MEDICAL CARE IF:  You have a fever.  You develop a cough that does not stop easily (persistent).  You have shortness of breath.  You start wheezing.  Symptoms interfere with normal daily activities.   This information is not intended to replace advice given to you by your health care provider. Make sure you discuss any questions you have with your health care provider.   Document Released: 04/10/2001 Document Revised: 08/06/2014 Document Reviewed: 03/23/2013 Elsevier Interactive Patient Education 2016 [employer] Inc.  

## 2015-05-24 NOTE — Addendum Note (Signed)
Addended by: Shelbie Ammons on: 05/24/2015 07:11 PM   Modules accepted: Orders

## 2015-06-05 DIAGNOSIS — R69 Illness, unspecified: Secondary | ICD-10-CM | POA: Diagnosis not present

## 2015-06-06 ENCOUNTER — Ambulatory Visit (INDEPENDENT_AMBULATORY_CARE_PROVIDER_SITE_OTHER): Payer: Medicare Other | Admitting: Internal Medicine

## 2015-06-06 ENCOUNTER — Telehealth: Payer: Self-pay | Admitting: Internal Medicine

## 2015-06-06 ENCOUNTER — Ambulatory Visit (INDEPENDENT_AMBULATORY_CARE_PROVIDER_SITE_OTHER): Payer: Medicare Other | Admitting: *Deleted

## 2015-06-06 ENCOUNTER — Encounter: Payer: Self-pay | Admitting: Internal Medicine

## 2015-06-06 VITALS — BP 132/68 | HR 74 | Ht 63.0 in | Wt 227.4 lb

## 2015-06-06 DIAGNOSIS — R058 Other specified cough: Secondary | ICD-10-CM

## 2015-06-06 DIAGNOSIS — I639 Cerebral infarction, unspecified: Secondary | ICD-10-CM | POA: Diagnosis not present

## 2015-06-06 DIAGNOSIS — R002 Palpitations: Secondary | ICD-10-CM

## 2015-06-06 DIAGNOSIS — R05 Cough: Secondary | ICD-10-CM | POA: Diagnosis not present

## 2015-06-06 DIAGNOSIS — R911 Solitary pulmonary nodule: Secondary | ICD-10-CM

## 2015-06-06 NOTE — Progress Notes (Signed)
[facility] Summary Report / Loop recorder

## 2015-06-06 NOTE — Telephone Encounter (Signed)
LMOM to make pt aware transmission was received. If there is need for any intervention we will be in touch with her.

## 2015-06-06 NOTE — Telephone Encounter (Signed)
New Message ° °4. Are you calling to see if we received your device transmission? Yes  ° °

## 2015-06-06 NOTE — Progress Notes (Signed)
Subjective:   Patient ID: [name], female    DOB: 1933/08/14    MRN: [id number]    Brief patient profile:  80 yowf quit light smoking 1977 and "always sob" /chronic dry cough referred to pulmonary clinic by Dr Morrie Sheldon 06/02/2014 for eval of SPN incidentally discovered after fell  10/27/13 in R ML x 8 mm.    History of Present Illness  06/02/2014 1st [facility] office visit/ [name]   Chief Complaint  Patient presents with  . Pulmonary Consult    Referred by Dr. Laurance Flatten for eval of pulmonary nodule. Pt fell and broke breast bone in March 2015 and nodule was found during workup for this. She states that she has had SOB "forever" and c/o cough x 6 months- non prod.   up until age 20 "could out run anybody and do yardwork but then  Developed  szs and cerebellar degen slowed down quite a bit and now w/c when goes out and walker at home. Cough x 6 months more likely daytime but occ wakes up noct (up to twice weekly), dry cough, assoc with dysphagia but this comes and goes rec You will need a follow up CT Chest in 04/2015  Try prilosec '20mg'$   Take 30-60 min before first meal of the day and Pepcid 20 mg one bedtime   GERD diet      06/06/2015  f/u ov/[name] re: cough much improved off lisinopril since 03/2015  Chief Complaint  Patient presents with  . Follow-up    Pt c/o of continued mostly dry cough with some wheeze and SOB. Pt reports that her symptoms have improved some since LOV.   as per her last ov here she was felt to have uacs but we did not have lisinopril listed on meds despite the fact that she says she'd been on it for years prior to stopping it in Sept at primary care's advice and much better since then.  No obvious day to day or daytime variability or assoc chronic cough or cp or chest tightness, subjective wheeze or overt sinus or hb symptoms. No unusual exp hx or h/o childhood pna/ asthma or knowledge of premature birth.  Sleeping ok without nocturnal  or early am  exacerbation  of respiratory  c/o's or need for noct saba. Also denies any obvious fluctuation of symptoms with weather or environmental changes or other aggravating or alleviating factors except as outlined above   Current Medications, Allergies, Complete Past Medical History, Past Surgical History, Family History, and Social History were reviewed in [employer] record.  ROS  The following are not active complaints unless bolded sore throat, dysphagia, dental problems, itching, sneezing,  nasal congestion or excess/ purulent secretions, ear ache,   fever, chills, sweats, unintended wt loss, classically pleuritic or exertional cp, hemoptysis,  orthopnea pnd or leg swelling, presyncope, palpitations, abdominal pain, anorexia, nausea, vomiting, diarrhea  or change in bowel or bladder habits, change in stools or urine, dysuria,hematuria,  rash, arthralgias, visual complaints, headache, numbness, weakness or ataxia or problems with walking or coordination,  change in mood/affect or memory.                               Objective:   Physical Exam   06/07/2015        227  Wt Readings from Last 3 Encounters:  06/02/14 228 lb (103.42 kg)  05/25/14 225 lb (102.059 kg)  05/24/14 225 lb (102.059 kg)    Vital signs reviewed   Obese wf w/c bound due to cerebellar ataxia   HEENT: nl dentition, turbinates, and orophanx. Nl external ear canals without cough reflex   NECK :  without JVD/Nodes/TM/ nl carotid upstrokes bilaterally   LUNGS: no acc muscle use, clear to A and P bilaterally without cough on insp or exp maneuvers   CV:  RRR  no s3 or murmur or increase in P2, no edema   ABD:  soft and nontender with nl excursion in the supine position. No bruits or organomegaly, bowel sounds nl  MS:  warm without deformities, calf tenderness, cyanosis or clubbing  SKIN: warm and dry without lesions    NEURO:  alert, approp, typical cerebellar tremor bilaterally        I personally reviewed images and agree with radiology impression as follows:  CT s contrast 05/10/15 11 x 9 mm right middle lobe nodule, unchanged since 10/27/2013, benign given 18 month stability.  No dedicated follow-up imaging is required per Fleischner guidelines.          Assessment & Plan:

## 2015-06-06 NOTE — Patient Instructions (Addendum)
Pulmonary follow up is as needed

## 2015-06-07 ENCOUNTER — Encounter: Payer: Self-pay | Admitting: Internal Medicine

## 2015-06-07 ENCOUNTER — Other Ambulatory Visit: Payer: Self-pay | Admitting: Ophthalmology

## 2015-06-07 ENCOUNTER — Other Ambulatory Visit (HOSPITAL_COMMUNITY): Payer: Self-pay | Admitting: Ophthalmology

## 2015-06-07 ENCOUNTER — Ambulatory Visit (HOSPITAL_COMMUNITY)
Admission: RE | Admit: 2015-06-07 | Discharge: 2015-06-07 | Disposition: A | Discharge status: Home / Self Care | Payer: Medicare Other | Source: Ambulatory Visit | Attending: Ophthalmology | Admitting: Ophthalmology

## 2015-06-07 DIAGNOSIS — I679 Cerebrovascular disease, unspecified: Secondary | ICD-10-CM | POA: Insufficient documentation

## 2015-06-07 DIAGNOSIS — E785 Hyperlipidemia, unspecified: Secondary | ICD-10-CM | POA: Diagnosis not present

## 2015-06-07 DIAGNOSIS — H532 Diplopia: Secondary | ICD-10-CM | POA: Diagnosis present

## 2015-06-07 DIAGNOSIS — G319 Degenerative disease of nervous system, unspecified: Secondary | ICD-10-CM | POA: Diagnosis not present

## 2015-06-07 MED ORDER — GADOBENATE DIMEGLUMINE 529 MG/ML IV SOLN
20.0000 mL | Freq: Once | INTRAVENOUS | Status: AC | PRN
  Administered 2015-06-07: 20 mL via INTRAVENOUS

## 2015-06-07 NOTE — Assessment & Plan Note (Addendum)
rec gerd rx first step 06/02/2014 but I did not realize she was on ACEi which reinforces UACS as dx here.   Classic Upper airway cough syndrome, so named because it's frequently impossible to sort out how much is  CR/sinusitis with freq throat clearing (which can be related to primary GERD)   vs  causing  secondary (" extra esophageal")  GERD from wide swings in gastric pressure that occur with throat clearing, often  promoting self use of mint and menthol lozenges that reduce the lower esophageal sphincter tone and exacerbate the problem further in a cyclical fashion.   These are the same pts (now being labeled as having "irritable larynx syndrome" by some cough centers) who not infrequently have a history of having failed to tolerate ace inhibitors,  dry powder inhalers or biphosphonates or report having atypical reflux symptoms that don't respond to standard doses of PPI , and are easily confused as having aecopd or asthma flares by even experienced allergists/ pulmonologists.   She is so much better at this point no need for further w/u though I offered to see her back at any point  I had an extended final summary discussion with the patient and husband reviewing all relevant studies completed to date and  lasting 15 to 20 minutes of a 25 minute visit   Each maintenance medication was reviewed in detail including most importantly the difference between maintenance and as needed and under what circumstances the prns are to be used.  Please see instructions for details which were reviewed in writing and the patient given a copy.    Pulmonary f/u is prn

## 2015-06-07 NOTE — Assessment & Plan Note (Signed)
-   incidental CT 10/27/13 RML x 8 mm > recheck 05/28/14  No change  - CT chest 05/10/15 11 x 9 mm right middle lobe nodule, unchanged since 10/27/2013, benign given 18 month stability. => No dedicated follow-up imaging is required per Fleischner Guidelines.  Advised no further studies needed

## 2015-06-10 ENCOUNTER — Encounter: Payer: Self-pay | Admitting: Internal Medicine

## 2015-06-20 ENCOUNTER — Encounter: Payer: Self-pay | Admitting: Internal Medicine

## 2015-06-20 ENCOUNTER — Ambulatory Visit (INDEPENDENT_AMBULATORY_CARE_PROVIDER_SITE_OTHER): Payer: Medicare Other | Admitting: Internal Medicine

## 2015-06-20 VITALS — BP 132/70 | HR 74 | Ht 63.0 in | Wt 226.8 lb

## 2015-06-20 DIAGNOSIS — I639 Cerebral infarction, unspecified: Secondary | ICD-10-CM

## 2015-06-20 DIAGNOSIS — I480 Paroxysmal atrial fibrillation: Secondary | ICD-10-CM

## 2015-06-20 DIAGNOSIS — R001 Bradycardia, unspecified: Secondary | ICD-10-CM

## 2015-06-20 DIAGNOSIS — I10 Essential (primary) hypertension: Secondary | ICD-10-CM | POA: Diagnosis not present

## 2015-06-20 NOTE — Patient Instructions (Signed)
Medication Instructions:  Your physician recommends that you continue on your current medications as directed. Please refer to the Current Medication list given to you today.   Labwork: None ordered   Testing/Procedures: None ordered   Follow-Up: Your physician wants you to follow-up in: 12 months with Dr Rayann Heman Dennis Bast will receive a reminder letter in the mail two months in advance. If you don't receive a letter, please call our office to schedule the follow-up appointment.   Remote monitoring is used to monitor your Pacemaker  from home. This monitoring reduces the number of office visits required to check your device to one time per year. It allows [place] to keep an eye on the functioning of your device to ensure it is working properly. You are scheduled for a device check from home on 09/19/15. You may send your transmission at any time that day. If you have a wireless device, the transmission will be sent automatically. After your physician reviews your transmission, you will receive a postcard with your next transmission date.    Any Other Special Instructions Will Be Listed Below (If Applicable).     If you need a refill on your cardiac medications before your next appointment, please call your pharmacy.

## 2015-06-20 NOTE — Progress Notes (Signed)
Electrophysiology Office Note   Date:  06/20/2015   ID:  [name], DOB 1933-08-28, MRN [id number]  PCP:  [name], MD  Cardiologist:  Dr Gwenlyn Found Primary Electrophysiologist: Thompson Grayer, MD    Chief Complaint  Patient presents with  . PAF  . Bradycardia     History of Present Illness: [name] is a 81 y.o. female who presents today for electrophysiology follow-up.   She has a longstanding history of multiple chronic medical issues.  She is not active.  Her primary concern is with progressive cerebellar disease and unsteadiness from this.  She has some fatigue She has done well since her ILR implant and has had no further episodes of dizziness or presyncope.  Today, she denies symptoms of chest pain, shortness of breath, orthopnea, PND, claudication,  bleeding, or neurologic sequela. The patient is tolerating medications without difficulties and is otherwise without complaint today.    Past Medical History  Diagnosis Date  . IBS (irritable bowel syndrome)   . Hypertension   . Hyperlipidemia   . GERD (gastroesophageal reflux disease)   . Depression   . Chronic pericarditis   . CVA (cerebral infarction) 05/2003  . Seizure disorder ([place])   . TIA (transient ischemic attack)   . Anxiety   . Chronic urinary tract infection   . Cerebellar degeneration   . Allergic rhinitis     PT. DENIES  . Arthritis     NECK  . Cataract   . Thyroid disease   . Seizures ([place])   . Hypothyroidism   . Burning tongue syndrome 25 years  . Complication of anesthesia     low o2 sats, coded 30 years ago  . Stroke [facility]) 5 years ago    Right side of face weak, slurred speach  . Gastric polyps   . Personal history of arterial venous malformation (AVM)     right side of face  . Sternum fx 10/27/2013  . High cholesterol   . Ataxia   . Gait disorder   . Bradycardia     primarily nocturnal  . Hypotension   . Obesity   . Hypertension   . Paroxysmal atrial fibrillation ([name])    chads2vasc score is 6,  she is felt to be a poor candidate for anticoagulation   Past Surgical History  Procedure Laterality Date  . Knee arthroscopy Right 11/14/2006  . Tibial and fibular internal fixation Left   . Upper gastrointestinal endoscopy  2009, 2013  . Colonoscopy  2006, 2009  . Appendectomy  81 years old  . Cyst removed  35 years ago  . Total abdominal hysterectomy  81 years old  . Knee arthroscopy Bilateral 5 and 6 years ago  . Knee arthroscopy with lateral menisectomy  07/03/2012    Procedure: KNEE ARTHROSCOPY WITH LATERAL MENISECTOMY;  Surgeon: Magnus Sinning, MD;  Location: [facility];  Service: Orthopedics;  Laterality: Left;  with Partial Lateral Menisectomy and Medial Menisectomy. Shaving of medial and lateral femoral condyles. Shaving of patella. Removal of a loose body  . Ep implantable device N/A 01/06/2015    Procedure: Loop Recorder Insertion;  Surgeon: Thompson Grayer, MD;  Location: [facility];  Service: Cardiovascular;  Laterality: N/A;     Current Outpatient Prescriptions  Medication Sig Dispense Refill  . amitriptyline (ELAVIL) 50 MG tablet Take 0.5 tablets (25 mg total) by mouth at bedtime. 90 tablet 3  . benzonatate (TESSALON) 200 MG capsule Take 1 capsule (200 mg total) by mouth 3 (  three) times daily as needed for cough. 30 capsule 2  . escitalopram (LEXAPRO) 20 MG tablet Take 1 tablet (20 mg total) by mouth at bedtime. 90 tablet 3  . fluticasone (FLONASE) 50 MCG/ACT nasal spray Place 2 sprays into both nostrils daily. 16 g 6  . furosemide (LASIX) 40 MG tablet Take 40 mg by mouth daily as needed for fluid.     Marland Kitchen gabapentin (NEURONTIN) 300 MG capsule Take 1 capsule (300 mg total) by mouth 3 (three) times daily. 270 capsule 3  . levothyroxine (SYNTHROID, LEVOTHROID) 75 MCG tablet Take 1 tablet (75 mcg total) by mouth daily before breakfast. 90 tablet 2  . losartan (COZAAR) 50 MG tablet Take 1 tablet (50 mg total) by mouth daily. 90 tablet 0  . lovastatin  (MEVACOR) 40 MG tablet Take 1 tablet (40 mg total) by mouth at bedtime. 90 tablet 3  . omeprazole (PRILOSEC) 40 MG capsule Take 1 capsule (40 mg total) by mouth daily. 90 capsule 3  . traZODone (DESYREL) 150 MG tablet 1/2 to 1 at bedtime as needed for sleep 90 tablet 2  . vitamin B-12 (CYANOCOBALAMIN) 1000 MCG tablet Take 1,000 mcg by mouth 2 (two) times a week.      No current facility-administered medications for this visit.    Allergies:   Lisinopril   Social History:  The patient  reports that she quit smoking about 39 years ago. Her smoking use included Cigarettes. She has a 2.5 pack-year smoking history. She has never used smokeless tobacco. She reports that she drinks alcohol. She reports that she does not use illicit drugs.   Family History:  The patient's  family history includes Coronary artery disease in her brother; Diabetes in her brother; Heart attack (age of onset: 39) in her father; Prostate cancer in her brother and son.    ROS:  Please see the history of present illness.   All other systems are reviewed and negative.    PHYSICAL EXAM: VS:  BP 132/70 mmHg  Pulse 74  Ht '5\' 3"'$  (1.6 m)  Wt 226 lb 12.8 oz (102.876 kg)  BMI 40.19 kg/m2 , BMI Body mass index is 40.19 kg/(m^2). GEN: morbidly obese and chronically ill, in a wheelchair today, in no acute distress HEENT: normal Neck: no JVD, carotid bruits, or masses Cardiac: RRR; no murmurs, rubs, or gallops,+ edema  Respiratory:  clear to auscultation bilaterally, normal work of breathing GI: soft, nontender, nondistended, + BS MS: diffuse atrophy Skin: warm and dry  Neuro:  In a wheelchair Psych: euthymic mood, full affect  Recent Labs: 11/24/2014: ALT 9; BNP 56.1; Hemoglobin 13.0; TSH 1.120 05/02/2015: BUN 15; Creatinine, Ser 0.81; Potassium 5.2; Sodium 135    Lipid Panel     Component Value Date/Time   CHOL 172 10/12/2014 1507   CHOL 185 09/02/2013 1204   TRIG 131 10/12/2014 1507   TRIG 114 05/08/2014 0410    HDL 59 10/12/2014 1507   HDL 56 05/08/2014 0410   HDL 59 09/02/2013 1204   CHOLHDL 3.0 05/08/2014 0410   CHOLHDL 3.1 09/02/2013 1204   VLDL 23 05/08/2014 0410   LDLCALC 87 05/08/2014 0410   LDLCALC 100* 09/02/2013 1204     Wt Readings from Last 3 Encounters:  06/20/15 226 lb 12.8 oz (102.876 kg)  06/06/15 227 lb 6.4 oz (103.148 kg)  05/17/15 222 lb (100.699 kg)     ASSESSMENT AND PLAN:  1. Sinus bradycardia ILR interrogation today reveals normal histogram.   She has  had no symptomatic transmissions and feels that her dizziness/ presyncope are much better presently.  No indication for additional EP intervention at this time.  I have reassured her today.  2. Paroxysmal atrial fibrillation Weight loss, exercise, and evaluation for sleep apnea are recommended.  Lifestyle modification would be very helpful in managing her afib. AF burden is <.1%   (Several episodes of sinus brady (nocturnal) with junctional rhythm has been mislabeled by the device as afib.) chads2vasc score is 6 though she is felt to be a poor candidate for anticoagulation.  Could consider left atrial appendage occlusion in the future, though given her multiple other comorbidities, I am reluctant to proceed presently.  3. HTN Stable No change required today  4. Obesity Weight loss  Current medicines are reviewed at length with the patient today.   The patient does not have concerns regarding her medicines.  The following changes were made today:  none  Follow-up with Dr Gwenlyn Found as scheduled Return to see me in 1 year [facility]   Signed, Thompson Grayer, MD  06/20/2015 11:57 AM     [place] [id number] [phone] (office) [phone] (fax)

## 2015-06-21 LAB — CUP PACEART INCLINIC DEVICE CHECK: Date Time Interrogation Session: 20161121174652

## 2015-06-24 ENCOUNTER — Ambulatory Visit (HOSPITAL_COMMUNITY)
Admission: RE | Admit: 2015-06-24 | Discharge: 2015-06-24 | Disposition: A | Discharge status: Home / Self Care | Payer: Medicare Other | Source: Ambulatory Visit | Attending: Ophthalmology | Admitting: Ophthalmology

## 2015-06-24 ENCOUNTER — Other Ambulatory Visit: Payer: Self-pay | Admitting: Ophthalmology

## 2015-06-24 DIAGNOSIS — H532 Diplopia: Secondary | ICD-10-CM | POA: Diagnosis not present

## 2015-06-24 DIAGNOSIS — H4720 Unspecified optic atrophy: Secondary | ICD-10-CM | POA: Diagnosis not present

## 2015-06-24 LAB — CUP PACEART REMOTE DEVICE CHECK: MDC IDC SESS DTM: 20161106120519

## 2015-06-27 ENCOUNTER — Encounter: Payer: Self-pay | Admitting: Neurology

## 2015-06-27 ENCOUNTER — Ambulatory Visit (INDEPENDENT_AMBULATORY_CARE_PROVIDER_SITE_OTHER): Payer: Medicare Other | Admitting: Neurology

## 2015-06-27 VITALS — HR 78 | Ht 63.0 in | Wt 227.0 lb

## 2015-06-27 DIAGNOSIS — G319 Degenerative disease of nervous system, unspecified: Secondary | ICD-10-CM | POA: Diagnosis not present

## 2015-06-27 DIAGNOSIS — G40109 Localization-related (focal) (partial) symptomatic epilepsy and epileptic syndromes with simple partial seizures, not intractable, without status epilepticus: Secondary | ICD-10-CM | POA: Insufficient documentation

## 2015-06-27 DIAGNOSIS — I639 Cerebral infarction, unspecified: Secondary | ICD-10-CM

## 2015-06-27 DIAGNOSIS — I679 Cerebrovascular disease, unspecified: Secondary | ICD-10-CM | POA: Diagnosis not present

## 2015-06-27 NOTE — Patient Instructions (Signed)
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  I can refer you to an academic center, such as [facility], however I don't think it would change management.  Management would involve symptomatic treatment  Follow up in one year

## 2015-06-27 NOTE — Progress Notes (Signed)
NEUROLOGY CONSULTATION NOTE  [name] MRN: [id number] DOB: [birth year]  Referring provider: Dr. [name] Primary care provider: Dr. [name]  Reason for consult:  Double vision  HISTORY OF PRESENT ILLNESS: [name] is a 81 year old right-handed female with cerebellar degeneration, hypertension, paroxysmal atrial fibrillation, IBS, hypothyroidism and history of TIA who presents for visual disturbance.  History obtained by patient and her husband.  Images of brain MRI and labs reviewed.  For 6 years, she has experienced gradual progression of balance problems.  She is now extremely unsteady on her feet and cannot ambulate without assistance.  She notes some associated dizziness with movement.  She also reports that her handwriting has gotten worse.  She has history of some swallowing issues when she eats and has had endoscopy with dilatation in the past.  She does have stress incontinence.  She has both horizontal and vertical diplopia, which was previously briefly treated with prisms.  PT has not helped with gait or balance in the past.  To evaluate diplopia, shhe had an MRI of the brain and orbits with and without contrast on 06/07/15, which showed no acute intracranial abnormalities.  It did reveal small vessel ischemic changes, global atrophy, particularly of the cerebellum, and area of signal abnormality within the corpus callosum, all known previously known entities.  Recent myasthenia gravis panel and thyrotropin receptor antibody was negative.  TSH and Free T4 from April were normal.  She has a sister with cerebellar degeneration, diagnosed 15 years ago, and has gradually become disabled.  She has another sister with multiple sclerosis.  She takes gabapentin for "smelling seizures" which were diagnosed 14 years ago.  PAST MEDICAL HISTORY: Past Medical History  Diagnosis Date  . IBS (irritable bowel syndrome)   . Hypertension   . Hyperlipidemia   . GERD (gastroesophageal  reflux disease)   . Depression   . Chronic pericarditis   . CVA (cerebral infarction) 05/2003  . Seizure disorder ([place])   . TIA (transient ischemic attack)   . Anxiety   . Chronic urinary tract infection   . Cerebellar degeneration   . Allergic rhinitis     PT. DENIES  . Arthritis     NECK  . Cataract   . Thyroid disease   . Seizures ([place])   . Hypothyroidism   . Burning tongue syndrome 25 years  . Complication of anesthesia     low o2 sats, coded 30 years ago  . Stroke [facility]) 5 years ago    Right side of face weak, slurred speach  . Gastric polyps   . Personal history of arterial venous malformation (AVM)     right side of face  . Sternum fx 10/27/2013  . High cholesterol   . Ataxia   . Gait disorder   . Bradycardia     primarily nocturnal  . Hypotension   . Obesity   . Hypertension   . Paroxysmal atrial fibrillation ([name])     chads2vasc score is 6,  she is felt to be a poor candidate for anticoagulation    PAST SURGICAL HISTORY: Past Surgical History  Procedure Laterality Date  . Knee arthroscopy Right 11/14/2006  . Tibial and fibular internal fixation Left   . Upper gastrointestinal endoscopy  2009, 2013  . Colonoscopy  2006, 2009  . Appendectomy  81 years old  . Cyst removed  35 years ago  . Total abdominal hysterectomy  81 years old  . Knee arthroscopy Bilateral 5 and 6 years ago  .  Knee arthroscopy with lateral menisectomy  07/03/2012    Procedure: KNEE ARTHROSCOPY WITH LATERAL MENISECTOMY;  Surgeon: Magnus Sinning, MD;  Location: [facility];  Service: Orthopedics;  Laterality: Left;  with Partial Lateral Menisectomy and Medial Menisectomy. Shaving of medial and lateral femoral condyles. Shaving of patella. Removal of a loose body  . Ep implantable device N/A 01/06/2015    Procedure: Loop Recorder Insertion;  Surgeon: Thompson Grayer, MD;  Location: [facility];  Service: Cardiovascular;  Laterality: N/A;    MEDICATIONS: Current Outpatient Prescriptions on  File Prior to Visit  Medication Sig Dispense Refill  . amitriptyline (ELAVIL) 50 MG tablet Take 0.5 tablets (25 mg total) by mouth at bedtime. 90 tablet 3  . benzonatate (TESSALON) 200 MG capsule Take 1 capsule (200 mg total) by mouth 3 (three) times daily as needed for cough. 30 capsule 2  . escitalopram (LEXAPRO) 20 MG tablet Take 1 tablet (20 mg total) by mouth at bedtime. 90 tablet 3  . fluticasone (FLONASE) 50 MCG/ACT nasal spray Place 2 sprays into both nostrils daily. 16 g 6  . furosemide (LASIX) 40 MG tablet Take 40 mg by mouth daily as needed for fluid.     Marland Kitchen gabapentin (NEURONTIN) 300 MG capsule Take 1 capsule (300 mg total) by mouth 3 (three) times daily. 270 capsule 3  . levothyroxine (SYNTHROID, LEVOTHROID) 75 MCG tablet Take 1 tablet (75 mcg total) by mouth daily before breakfast. 90 tablet 2  . losartan (COZAAR) 50 MG tablet Take 1 tablet (50 mg total) by mouth daily. 90 tablet 0  . lovastatin (MEVACOR) 40 MG tablet Take 1 tablet (40 mg total) by mouth at bedtime. 90 tablet 3  . omeprazole (PRILOSEC) 40 MG capsule Take 1 capsule (40 mg total) by mouth daily. 90 capsule 3  . traZODone (DESYREL) 150 MG tablet 1/2 to 1 at bedtime as needed for sleep 90 tablet 2  . vitamin B-12 (CYANOCOBALAMIN) 1000 MCG tablet Take 1,000 mcg by mouth 2 (two) times a week.      No current facility-administered medications on file prior to visit.    ALLERGIES: Allergies  Allergen Reactions  . Lisinopril     Cough ?    FAMILY HISTORY: Family History  Problem Relation Age of Onset  . Heart attack Father 29    fatal  . Coronary artery disease Brother   . Diabetes Brother   . Prostate cancer Brother   . Prostate cancer Son     SOCIAL HISTORY: Social History   Social History  . Marital Status: Married    Spouse Name: N/A  . Number of Children: N/A  . Years of Education: N/A   Occupational History  . Not on file.   Social History Main Topics  . Smoking status: Former Smoker -- 0.25  packs/day for 10 years    Types: Cigarettes    Quit date: 07/31/1975  . Smokeless tobacco: Never Used  . Alcohol Use: Yes     Comment: 2 glasses a year  . Drug Use: No  . Sexual Activity: Not on file   Other Topics Concern  . Not on file   Social History Narrative   Lives with husband, does have stairs, does not use them. Pt completed 10th grade.    REVIEW OF SYSTEMS: Constitutional: No fevers, chills, or sweats, no generalized fatigue, change in appetite Eyes: No visual changes, double vision, eye pain Ear, nose and throat: No hearing loss, ear pain, nasal congestion, sore throat  Cardiovascular: No chest pain, palpitations Respiratory:  No shortness of breath at rest or with exertion, wheezes GastrointestinaI: IBS Genitourinary:  As above Musculoskeletal:  No neck pain, back pain Integumentary: No rash, pruritus, skin lesions Neurological: as above Psychiatric: depression, anxiety Allergic/Immunologic: no itchy/runny eyes, nasal congestion, recent allergic reactions, rashes  PHYSICAL EXAM: Filed Vitals:   06/27/15 1453  Pulse: 78   General: No acute distress.   Head:  Normocephalic/atraumatic Eyes:  fundi unremarkable, without vessel changes, exudates, hemorrhages or papilledema. Neck: supple, no paraspinal tenderness, full range of motion Back: No paraspinal tenderness Heart: regular rate and rhythm Lungs: Clear to auscultation bilaterally. Vascular: No carotid bruits. Neurological Exam: Mental status: alert and oriented to person, place, and time, recent and remote memory intact, fund of knowledge intact, attention and concentration intact, speech fluent and not dysarthric, language intact. Cranial nerves: CN I: not tested CN II: pupils equal, round and reactive to light, visual fields intact, fundi unremarkable, without vessel changes, exudates, hemorrhages or papilledema. CN III, IV, VI:  full range of motion but saccadic eye movements with tracking, no nystagmus,  no ptosis CN V: facial sensation intact CN VII: upper and lower face symmetric CN VIII: hearing intact CN IX, X: gag intact, uvula midline CN XI: sternocleidomastoid and trapezius muscles intact CN XII: tongue midline Bulk & Tone: normal, no fasciculations. Motor:  5/5 throughout Sensation: temperature and vibration sensation intact. Deep Tendon Reflexes: brisk throughout, toes downgoing. Finger to nose testing:  Without dysmetria. Heel to shin:  With some dysmetria but may also be complicated by bilateral knee problems Gait:  Able to stand self up from wheelchair but cannot take a step without assistance.  Ataxic.  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  Unfortunately, definitive testing requires genetic testing, which is extremely costly.  We can refer her to an academic center, however it would likely not change management Cerebrovascular disease.  Ideally, she would benefit from antiplatelet therapy (or anticoagulation given history of PAF as well) to address cerebrovascular disease.  Unfortunately, she is unable to take blood thinners due to a large AVM in her face. Questionable simple partial seizures ("smelling seizures"), stable  PLAN: 1.  Symptomatic management (such as prism glasses or wearing a patch, monitor dysphagia, etc) 2.  Continue gabapentin '300mg'$  three times daily. 3.  Follow up in one year.  Thank you for allowing me to take part in the care of this patient.  Metta Clines, DO  CC:  [name], MD

## 2015-07-05 ENCOUNTER — Ambulatory Visit (INDEPENDENT_AMBULATORY_CARE_PROVIDER_SITE_OTHER): Payer: Medicare Other | Admitting: *Deleted

## 2015-07-05 DIAGNOSIS — R002 Palpitations: Secondary | ICD-10-CM | POA: Diagnosis not present

## 2015-07-05 NOTE — Progress Notes (Signed)
Carelink Summary Report / Loop Recorder 

## 2015-08-04 ENCOUNTER — Ambulatory Visit (INDEPENDENT_AMBULATORY_CARE_PROVIDER_SITE_OTHER): Payer: Medicare Other | Admitting: *Deleted

## 2015-08-04 DIAGNOSIS — R002 Palpitations: Secondary | ICD-10-CM

## 2015-08-04 NOTE — Progress Notes (Signed)
Carelink Summary Report / Loop Recorder 

## 2015-08-10 DIAGNOSIS — T1490 Injury, unspecified: Secondary | ICD-10-CM | POA: Diagnosis not present

## 2015-08-22 ENCOUNTER — Other Ambulatory Visit: Payer: Self-pay | Admitting: Family Medicine

## 2015-08-23 ENCOUNTER — Encounter: Payer: Self-pay | Admitting: Internal Medicine

## 2015-08-25 ENCOUNTER — Telehealth: Payer: Self-pay | Admitting: Family Medicine

## 2015-08-30 LAB — CUP PACEART REMOTE DEVICE CHECK: Date Time Interrogation Session: 20161206123525

## 2015-09-05 ENCOUNTER — Ambulatory Visit (INDEPENDENT_AMBULATORY_CARE_PROVIDER_SITE_OTHER): Payer: Medicare Other | Admitting: *Deleted

## 2015-09-05 DIAGNOSIS — R002 Palpitations: Secondary | ICD-10-CM

## 2015-09-06 NOTE — Progress Notes (Signed)
Carelink Summary Report / Loop Recorder 

## 2015-09-07 ENCOUNTER — Encounter: Payer: Self-pay | Admitting: Neurology

## 2015-09-15 LAB — CUP PACEART REMOTE DEVICE CHECK: MDC IDC SESS DTM: 20170105130532

## 2015-09-15 NOTE — Progress Notes (Signed)
Carelink summary report received. Battery status OK. Normal device function. No new symptom episodes, tachy episodes, brady, or pause episodes. No new AF episodes. Monthly summary reports and ROV/PRN 

## 2015-09-30 ENCOUNTER — Encounter: Payer: Self-pay | Admitting: Family

## 2015-09-30 ENCOUNTER — Ambulatory Visit (INDEPENDENT_AMBULATORY_CARE_PROVIDER_SITE_OTHER): Payer: Medicare Other | Admitting: Family

## 2015-09-30 VITALS — BP 117/70 | HR 75 | Temp 96.9°F | Ht 63.0 in | Wt 230.0 lb

## 2015-09-30 DIAGNOSIS — R600 Localized edema: Secondary | ICD-10-CM | POA: Insufficient documentation

## 2015-09-30 DIAGNOSIS — G459 Transient cerebral ischemic attack, unspecified: Secondary | ICD-10-CM

## 2015-09-30 DIAGNOSIS — F32A Depression, unspecified: Secondary | ICD-10-CM

## 2015-09-30 DIAGNOSIS — R609 Edema, unspecified: Secondary | ICD-10-CM

## 2015-09-30 DIAGNOSIS — I639 Cerebral infarction, unspecified: Secondary | ICD-10-CM

## 2015-09-30 DIAGNOSIS — I10 Essential (primary) hypertension: Secondary | ICD-10-CM

## 2015-09-30 DIAGNOSIS — I480 Paroxysmal atrial fibrillation: Secondary | ICD-10-CM | POA: Diagnosis not present

## 2015-09-30 DIAGNOSIS — R5383 Other fatigue: Secondary | ICD-10-CM | POA: Diagnosis not present

## 2015-09-30 DIAGNOSIS — F411 Generalized anxiety disorder: Secondary | ICD-10-CM

## 2015-09-30 DIAGNOSIS — E785 Hyperlipidemia, unspecified: Secondary | ICD-10-CM | POA: Diagnosis not present

## 2015-09-30 DIAGNOSIS — K219 Gastro-esophageal reflux disease without esophagitis: Secondary | ICD-10-CM

## 2015-09-30 DIAGNOSIS — J309 Allergic rhinitis, unspecified: Secondary | ICD-10-CM

## 2015-09-30 DIAGNOSIS — E038 Other specified hypothyroidism: Secondary | ICD-10-CM | POA: Diagnosis not present

## 2015-09-30 DIAGNOSIS — G4700 Insomnia, unspecified: Secondary | ICD-10-CM | POA: Insufficient documentation

## 2015-09-30 DIAGNOSIS — G40909 Epilepsy, unspecified, not intractable, without status epilepticus: Secondary | ICD-10-CM

## 2015-09-30 DIAGNOSIS — F329 Major depressive disorder, single episode, unspecified: Secondary | ICD-10-CM | POA: Diagnosis not present

## 2015-09-30 MED ORDER — FLUTICASONE PROPIONATE 50 MCG/ACT NA SUSP: 2.0000 | Freq: Every day | NASAL | Status: DC

## 2015-09-30 MED ORDER — FUROSEMIDE 40 MG PO TABS: 40.0000 mg | ORAL_TABLET | Freq: Every day | ORAL | Status: DC | PRN

## 2015-09-30 MED ORDER — AMITRIPTYLINE HCL 50 MG PO TABS: 25.0000 mg | ORAL_TABLET | Freq: Every day | ORAL | Status: DC

## 2015-09-30 MED ORDER — GABAPENTIN 300 MG PO CAPS: 300.0000 mg | ORAL_CAPSULE | Freq: Three times a day (TID) | ORAL | Status: DC

## 2015-09-30 MED ORDER — OMEPRAZOLE 40 MG PO CPDR: 40.0000 mg | DELAYED_RELEASE_CAPSULE | Freq: Every day | ORAL | Status: DC

## 2015-09-30 MED ORDER — LEVOTHYROXINE SODIUM 75 MCG PO TABS: 75.0000 ug | ORAL_TABLET | Freq: Every day | ORAL | Status: DC

## 2015-09-30 MED ORDER — LOSARTAN POTASSIUM 50 MG PO TABS: 50.0000 mg | ORAL_TABLET | Freq: Every day | ORAL | Status: DC

## 2015-09-30 MED ORDER — ESCITALOPRAM OXALATE 20 MG PO TABS: 20.0000 mg | ORAL_TABLET | Freq: Every day | ORAL | Status: DC

## 2015-09-30 MED ORDER — TRAZODONE HCL 150 MG PO TABS: ORAL_TABLET | ORAL | Status: DC

## 2015-09-30 MED ORDER — LOVASTATIN 40 MG PO TABS: 40.0000 mg | ORAL_TABLET | Freq: Every day | ORAL | Status: DC

## 2015-09-30 NOTE — Patient Instructions (Signed)
Health Maintenance, Female Adopting a healthy lifestyle and getting preventive care can go a long way to promote health and wellness. Talk with your health care provider about what schedule of regular examinations is right for you. This is a good chance for you to check in with your provider about disease prevention and staying healthy. In between checkups, there are plenty of things you can do on your own. Experts have done a lot of research about which lifestyle changes and preventive measures are most likely to keep you healthy. Ask your health care provider for more information. WEIGHT AND DIET  Eat a healthy diet  Be sure to include plenty of vegetables, fruits, low-fat dairy products, and lean protein.  Do not eat a lot of foods high in solid fats, added sugars, or salt.  Get regular exercise. This is one of the most important things you can do for your health.  Most adults should exercise for at least 150 minutes each week. The exercise should increase your heart rate and make you sweat (moderate-intensity exercise).  Most adults should also do strengthening exercises at least twice a week. This is in addition to the moderate-intensity exercise.  Maintain a healthy weight  Body mass index (BMI) is a measurement that can be used to identify possible weight problems. It estimates body fat based on height and weight. Your health care provider can help determine your BMI and help you achieve or maintain a healthy weight.  For females 20 years of age and older:   A BMI below 18.5 is considered underweight.  A BMI of 18.5 to 24.9 is normal.  A BMI of 25 to 29.9 is considered overweight.  A BMI of 30 and above is considered obese.  Watch levels of cholesterol and blood lipids  You should start having your blood tested for lipids and cholesterol at 80 years of age, then have this test every 5 years.  You may need to have your cholesterol levels checked more often if:  Your lipid  or cholesterol levels are high.  You are older than 80 years of age.  You are at high risk for heart disease.  CANCER SCREENING   Lung Cancer  Lung cancer screening is recommended for adults 55-80 years old who are at high risk for lung cancer because of a history of smoking.  A yearly low-dose CT scan of the lungs is recommended for people who:  Currently smoke.  Have quit within the past 15 years.  Have at least a 30-pack-year history of smoking. A pack year is smoking an average of one pack of cigarettes a day for 1 year.  Yearly screening should continue until it has been 15 years since you quit.  Yearly screening should stop if you develop a health problem that would prevent you from having lung cancer treatment.  Breast Cancer  Practice breast self-awareness. This means understanding how your breasts normally appear and feel.  It also means doing regular breast self-exams. Let your health care provider know about any changes, no matter how small.  If you are in your 20s or 30s, you should have a clinical breast exam (CBE) by a health care provider every 1-3 years as part of a regular health exam.  If you are 40 or older, have a CBE every year. Also consider having a breast X-ray (mammogram) every year.  If you have a family history of breast cancer, talk to your health care provider about genetic screening.  If you   are at high risk for breast cancer, talk to your health care provider about having an MRI and a mammogram every year.  Breast cancer gene (BRCA) assessment is recommended for women who have family members with BRCA-related cancers. BRCA-related cancers include:  Breast.  Ovarian.  Tubal.  Peritoneal cancers.  Results of the assessment will determine the need for genetic counseling and BRCA1 and BRCA2 testing. Cervical Cancer Your health care provider may recommend that you be screened regularly for cancer of the pelvic organs (ovaries, uterus, and  vagina). This screening involves a pelvic examination, including checking for microscopic changes to the surface of your cervix (Pap test). You may be encouraged to have this screening done every 3 years, beginning at age 21.  For women ages 30-65, health care providers may recommend pelvic exams and Pap testing every 3 years, or they may recommend the Pap and pelvic exam, combined with testing for human papilloma virus (HPV), every 5 years. Some types of HPV increase your risk of cervical cancer. Testing for HPV may also be done on women of any age with unclear Pap test results.  Other health care providers may not recommend any screening for nonpregnant women who are considered low risk for pelvic cancer and who do not have symptoms. Ask your health care provider if a screening pelvic exam is right for you.  If you have had past treatment for cervical cancer or a condition that could lead to cancer, you need Pap tests and screening for cancer for at least 20 years after your treatment. If Pap tests have been discontinued, your risk factors (such as having a new sexual partner) need to be reassessed to determine if screening should resume. Some women have medical problems that increase the chance of getting cervical cancer. In these cases, your health care provider may recommend more frequent screening and Pap tests. Colorectal Cancer  This type of cancer can be detected and often prevented.  Routine colorectal cancer screening usually begins at 80 years of age and continues through 80 years of age.  Your health care provider may recommend screening at an earlier age if you have risk factors for colon cancer.  Your health care provider may also recommend using home test kits to check for hidden blood in the stool.  A small camera at the end of a tube can be used to examine your colon directly (sigmoidoscopy or colonoscopy). This is done to check for the earliest forms of colorectal  cancer.  Routine screening usually begins at age 50.  Direct examination of the colon should be repeated every 5-10 years through 80 years of age. However, you may need to be screened more often if early forms of precancerous polyps or small growths are found. Skin Cancer  Check your skin from head to toe regularly.  Tell your health care provider about any new moles or changes in moles, especially if there is a change in a mole's shape or color.  Also tell your health care provider if you have a mole that is larger than the size of a pencil eraser.  Always use sunscreen. Apply sunscreen liberally and repeatedly throughout the day.  Protect yourself by wearing long sleeves, pants, a wide-brimmed hat, and sunglasses whenever you are outside. HEART DISEASE, DIABETES, AND HIGH BLOOD PRESSURE   High blood pressure causes heart disease and increases the risk of stroke. High blood pressure is more likely to develop in:  People who have blood pressure in the high end   of the normal range (130-139/85-89 mm Hg).  People who are overweight or obese.  People who are African American.  If you are 38-23 years of age, have your blood pressure checked every 3-5 years. If you are 61 years of age or older, have your blood pressure checked every year. You should have your blood pressure measured twice--once when you are at a hospital or clinic, and once when you are not at a hospital or clinic. Record the average of the two measurements. To check your blood pressure when you are not at a hospital or clinic, you can use:  An automated blood pressure machine at a pharmacy.  A home blood pressure monitor.  If you are between 45 years and 39 years old, ask your health care provider if you should take aspirin to prevent strokes.  Have regular diabetes screenings. This involves taking a blood sample to check your fasting blood sugar level.  If you are at a normal weight and have a low risk for diabetes,  have this test once every three years after 80 years of age.  If you are overweight and have a high risk for diabetes, consider being tested at a younger age or more often. PREVENTING INFECTION  Hepatitis B  If you have a higher risk for hepatitis B, you should be screened for this virus. You are considered at high risk for hepatitis B if:  You were born in a country where hepatitis B is common. Ask your health care provider which countries are considered high risk.  Your parents were born in a high-risk country, and you have not been immunized against hepatitis B (hepatitis B vaccine).  You have HIV or AIDS.  You use needles to inject street drugs.  You live with someone who has hepatitis B.  You have had sex with someone who has hepatitis B.  You get hemodialysis treatment.  You take certain medicines for conditions, including cancer, organ transplantation, and autoimmune conditions. Hepatitis C  Blood testing is recommended for:  Everyone born from [birth year] through 1963.  Anyone with known risk factors for hepatitis C. Sexually transmitted infections (STIs)  You should be screened for sexually transmitted infections (STIs) including gonorrhea and chlamydia if:  You are sexually active and are younger than 80 years of age.  You are older than 80 years of age and your health care provider tells you that you are at risk for this type of infection.  Your sexual activity has changed since you were last screened and you are at an increased risk for chlamydia or gonorrhea. Ask your health care provider if you are at risk.  If you do not have HIV, but are at risk, it may be recommended that you take a prescription medicine daily to prevent HIV infection. This is called pre-exposure prophylaxis (PrEP). You are considered at risk if:  You are sexually active and do not regularly use condoms or know the HIV status of your partner(s).  You take drugs by injection.  You are sexually  active with a partner who has HIV. Talk with your health care provider about whether you are at high risk of being infected with HIV. If you choose to begin PrEP, you should first be tested for HIV. You should then be tested every 3 months for as long as you are taking PrEP.  PREGNANCY   If you are premenopausal and you may become pregnant, ask your health care provider about preconception counseling.  If you may  become pregnant, take 400 to 800 micrograms (mcg) of folic acid every day.  If you want to prevent pregnancy, talk to your health care provider about birth control (contraception). OSTEOPOROSIS AND MENOPAUSE   Osteoporosis is a disease in which the bones lose minerals and strength with aging. This can result in serious bone fractures. Your risk for osteoporosis can be identified using a bone density scan.  If you are 61 years of age or older, or if you are at risk for osteoporosis and fractures, ask your health care provider if you should be screened.  Ask your health care provider whether you should take a calcium or vitamin D supplement to lower your risk for osteoporosis.  Menopause may have certain physical symptoms and risks.  Hormone replacement therapy may reduce some of these symptoms and risks. Talk to your health care provider about whether hormone replacement therapy is right for you.  HOME CARE INSTRUCTIONS   Schedule regular health, dental, and eye exams.  Stay current with your immunizations.   Do not use any tobacco products including cigarettes, chewing tobacco, or electronic cigarettes.  If you are pregnant, do not drink alcohol.  If you are breastfeeding, limit how much and how often you drink alcohol.  Limit alcohol intake to no more than 1 drink per day for nonpregnant women. One drink equals 12 ounces of beer, 5 ounces of wine, or 1 ounces of hard liquor.  Do not use street drugs.  Do not share needles.  Ask your health care provider for help if  you need support or information about quitting drugs.  Tell your health care provider if you often feel depressed.  Tell your health care provider if you have ever been abused or do not feel safe at home.   This information is not intended to replace advice given to you by your health care provider. Make sure you discuss any questions you have with your health care provider.   Document Released: 01/29/2011 Document Revised: 08/06/2014 Document Reviewed: 06/17/2013 Elsevier Interactive Patient Education [employer].

## 2015-09-30 NOTE — Progress Notes (Signed)
Subjective:    Patient ID: [name], female    DOB: 1933/09/18, 82 y.o.   MRN: [id number]  PT presents to the office for chronic follow up. Pt is followed by two Cardiologists (Dr. Rayann Heman & Dr. Gwenlyn Found) for atrial fib and bradycardia. PT is followed by neurology once a year for seizures.  Hypertension This is a chronic problem. The current episode started more than 1 year ago. The problem has been resolved since onset. The problem is controlled. Associated symptoms include anxiety and palpitations. Pertinent negatives include no headaches, peripheral edema or shortness of breath. Risk factors for coronary artery disease include dyslipidemia, obesity, post-menopausal state, sedentary lifestyle and family history. Past treatments include angiotensin blockers. The current treatment provides moderate improvement. Hypertensive end-organ damage includes CVA and a thyroid problem. There is no history of kidney disease, CAD/MI or heart failure.  Depression      The patient presents with depression.  This is a chronic problem.  The current episode started more than 1 year ago.   The onset quality is gradual.   The problem occurs intermittently.  The problem has been waxing and waning since onset.  Associated symptoms include no fatigue, no helplessness, no hopelessness, does not have insomnia, no restlessness, no headaches, not sad and no suicidal ideas.     The symptoms are aggravated by family issues.  Past treatments include SSRIs - Selective serotonin reuptake inhibitors.  Compliance with treatment is good.  Past medical history includes thyroid problem, anxiety and depression.   Anxiety Presents for follow-up visit. Onset was more than 5 years ago. The problem has been waxing and waning. Symptoms include depressed mood, dry mouth, excessive worry, nervous/anxious behavior and palpitations. Patient reports no insomnia, irritability, restlessness, shortness of breath or suicidal ideas. Symptoms occur  occasionally. The symptoms are aggravated by family issues. The quality of sleep is good.   Her past medical history is significant for anxiety/panic attacks and depression. Past treatments include SSRIs.  Thyroid Problem Presents for follow-up visit. Symptoms include anxiety, depressed mood and palpitations. Patient reports no constipation, diarrhea, fatigue or hoarse voice. The symptoms have been stable. Past treatments include levothyroxine. The treatment provided moderate relief. There is no history of heart failure.  Gastroesophageal Reflux She reports no belching, no coughing, no heartburn or no hoarse voice. This is a chronic problem. The current episode started more than 1 year ago. The problem occurs rarely. The problem has been waxing and waning. The symptoms are aggravated by certain foods. Pertinent negatives include no fatigue. Risk factors include obesity. She has tried a PPI for the symptoms. The treatment provided significant relief.  Seizures  This is a chronic (PT is followed by neurology every year) problem. Pertinent negatives include no headaches, no cough and no diarrhea. Characteristics do not include bladder incontinence.  Insomnia Primary symptoms: difficulty falling asleep.  The current episode started more than one year. The problem occurs every several days. The symptoms are aggravated by anxiety. PMH includes: depression.      Review of Systems  Constitutional: Negative.  Negative for irritability and fatigue.  HENT: Negative for hoarse voice.   Eyes: Negative.   Respiratory: Negative.  Negative for cough and shortness of breath.   Cardiovascular: Positive for palpitations.  Gastrointestinal: Negative.  Negative for heartburn, diarrhea and constipation.  Endocrine: Negative.   Genitourinary: Negative.  Negative for bladder incontinence.  Musculoskeletal: Negative.   Neurological: Positive for seizures. Negative for headaches.  Hematological: Negative.  Psychiatric/Behavioral: Positive for depression. Negative for suicidal ideas. The patient is nervous/anxious. The patient does not have insomnia.   All other systems reviewed and are negative.      Objective:   Physical Exam  Constitutional: She is oriented to person, place, and time. She appears well-developed and well-nourished. No distress.  HENT:  Head: Normocephalic and atraumatic.  Right Ear: External ear normal.  Left Ear: External ear normal.  Nose: Nose normal.  Mouth/Throat: Oropharynx is clear and moist.  Eyes: Pupils are equal, round, and reactive to light.  Neck: Normal range of motion. Neck supple. No thyromegaly present.  Cardiovascular: Normal rate, regular rhythm, normal heart sounds and intact distal pulses.   No murmur heard. Pulmonary/Chest: Effort normal and breath sounds normal. No respiratory distress. She has no wheezes.  Abdominal: Soft. Bowel sounds are normal. She exhibits no distension. There is no tenderness.  Musculoskeletal: She exhibits no edema or tenderness.  Pt in wheelchair   Neurological: She is alert and oriented to person, place, and time. She has normal reflexes. No cranial nerve deficit.  Skin: Skin is warm and dry.  Psychiatric: She has a normal mood and affect. Her behavior is normal. Judgment and thought content normal.  Vitals reviewed.     BP 117/70 mmHg  Pulse 75  Temp(Src) 96.9 F (36.1 C) (Oral)  Ht '5\' 3"'$  (1.6 m)  Wt 230 lb (104.327 kg)  BMI 40.75 kg/m2     Assessment & Plan:  1. Essential hypertension - CMP14+EGFR - losartan (COZAAR) 50 MG tablet; Take 1 tablet (50 mg total) by mouth daily.  Dispense: 90 tablet; Refill: 2  2. Transient cerebral ischemia, unspecified transient cerebral ischemia type - CMP14+EGFR  3. Paroxysmal atrial fibrillation ([name]) - CMP14+EGFR  4. Allergic rhinitis, unspecified allergic rhinitis type  - CMP14+EGFR - fluticasone (FLONASE) 50 MCG/ACT nasal spray; Place 2 sprays into both  nostrils daily.  Dispense: 16 g; Refill: 6  5. Gastroesophageal reflux disease, esophagitis presence not specified - CMP14+EGFR - omeprazole (PRILOSEC) 40 MG capsule; Take 1 capsule (40 mg total) by mouth daily.  Dispense: 90 capsule; Refill: 3  6. Hyperlipemia  - CMP14+EGFR - Lipid panel - lovastatin (MEVACOR) 40 MG tablet; Take 1 tablet (40 mg total) by mouth at bedtime.  Dispense: 90 tablet; Refill: 3  7. Depression  - CMP14+EGFR - escitalopram (LEXAPRO) 20 MG tablet; Take 1 tablet (20 mg total) by mouth at bedtime.  Dispense: 90 tablet; Refill: 2  8. Cerebral infarction due to unspecified mechanism - CMP14+EGFR  9. Seizure disorder ([name])  - CMP14+EGFR - gabapentin (NEURONTIN) 300 MG capsule; Take 1 capsule (300 mg total) by mouth 3 (three) times daily.  Dispense: 270 capsule; Refill: 3  10. Other specified hypothyroidism  - CMP14+EGFR - Thyroid Panel With TSH - levothyroxine (SYNTHROID, LEVOTHROID) 75 MCG tablet; Take 1 tablet (75 mcg total) by mouth daily before breakfast.  Dispense: 90 tablet; Refill: 2  11. Morbid obesity, unspecified obesity type ([place])  - CMP14+EGFR  12. Other fatigue - CMP14+EGFR  13. GAD (generalized anxiety disorder)  - escitalopram (LEXAPRO) 20 MG tablet; Take 1 tablet (20 mg total) by mouth at bedtime.  Dispense: 90 tablet; Refill: 2  14. Peripheral edema  - furosemide (LASIX) 40 MG tablet; Take 1 tablet (40 mg total) by mouth daily as needed for fluid.  Dispense: 30 tablet; Refill: 3  15. Insomnia - traZODone (DESYREL) 150 MG tablet; 1/2 to 1 at bedtime as needed for sleep  Dispense: 90 tablet;  Refill: 2   Continue all meds Labs pending Health Maintenance reviewed Diet and exercise encouraged RTO 6 months  Evelina Dun, FNP

## 2015-10-01 LAB — THYROID PANEL WITH TSH
FREE THYROXINE INDEX: 1.9 (ref 1.2–4.9)
T3 Uptake Ratio: 22 % — ABNORMAL LOW (ref 24–39)
T4 TOTAL: 8.5 ug/dL (ref 4.5–12.0)
TSH: 2.98 u[IU]/mL (ref 0.450–4.500)

## 2015-10-01 LAB — LIPID PANEL
CHOLESTEROL TOTAL: 193 mg/dL (ref 100–199)
Chol/HDL Ratio: 3 ratio units (ref 0.0–4.4)
HDL: 65 mg/dL (ref >39)
LDL Calculated: 111 mg/dL — ABNORMAL HIGH (ref 0–99)
Triglycerides: 85 mg/dL (ref 0–149)
VLDL CHOLESTEROL CAL: 17 mg/dL (ref 5–40)

## 2015-10-01 LAB — CMP14+EGFR
ALK PHOS: 78 IU/L (ref 39–117)
ALT: 13 IU/L (ref 0–32)
AST: 19 IU/L (ref 0–40)
Albumin/Globulin Ratio: 1.6 (ref 1.1–2.5)
Albumin: 4.4 g/dL (ref 3.5–4.8)
BILIRUBIN TOTAL: 0.3 mg/dL (ref 0.0–1.2)
BUN / CREAT RATIO: 13 (ref 11–26)
BUN: 11 mg/dL (ref 8–27)
CHLORIDE: 99 mmol/L (ref 96–106)
CO2: 25 mmol/L (ref 18–29)
Calcium: 10 mg/dL (ref 8.7–10.3)
Creatinine, Ser: 0.86 mg/dL (ref 0.57–1.00)
GFR calc Af Amer: 74 mL/min/{1.73_m2} (ref >59)
GFR calc non Af Amer: 64 mL/min/{1.73_m2} (ref >59)
GLUCOSE: 103 mg/dL — AB (ref 65–99)
Globulin, Total: 2.7 g/dL (ref 1.5–4.5)
Potassium: 5.4 mmol/L — ABNORMAL HIGH (ref 3.5–5.2)
Sodium: 139 mmol/L (ref 134–144)
Total Protein: 7.1 g/dL (ref 6.0–8.5)

## 2015-10-03 ENCOUNTER — Other Ambulatory Visit: Payer: Self-pay | Admitting: Family

## 2015-10-03 ENCOUNTER — Ambulatory Visit (INDEPENDENT_AMBULATORY_CARE_PROVIDER_SITE_OTHER): Payer: Medicare Other | Admitting: *Deleted

## 2015-10-03 DIAGNOSIS — R002 Palpitations: Secondary | ICD-10-CM | POA: Diagnosis not present

## 2015-10-03 DIAGNOSIS — E875 Hyperkalemia: Secondary | ICD-10-CM

## 2015-10-03 NOTE — Addendum Note (Signed)
Addended by: Jamelle Haring on: 10/03/2015 10:34 AM   Modules accepted: Orders

## 2015-10-04 LAB — CUP PACEART REMOTE DEVICE CHECK: MDC IDC SESS DTM: 20170204130559

## 2015-10-04 NOTE — Progress Notes (Signed)
Carelink summary report received. Battery status OK. Normal device function. No new symptom episodes, tachy episodes, brady, or pause episodes. No new AF episodes. Monthly summary reports and ROV/PRN 

## 2015-10-06 NOTE — Progress Notes (Signed)
Carelink Summary Report / Loop Recorder 

## 2015-10-08 ENCOUNTER — Other Ambulatory Visit: Payer: Self-pay | Admitting: Family Medicine

## 2015-10-08 LAB — CUP PACEART REMOTE DEVICE CHECK: MDC IDC SESS DTM: 20170306133525

## 2015-10-08 NOTE — Progress Notes (Signed)
Carelink summary report received. Battery status OK. Normal device function. No new symptom episodes, tachy episodes, brady, or pause episodes. No new AF episodes. Monthly summary reports and ROV/PRN 

## 2015-10-10 ENCOUNTER — Ambulatory Visit (INDEPENDENT_AMBULATORY_CARE_PROVIDER_SITE_OTHER): Payer: Medicare Other | Admitting: Family

## 2015-10-10 ENCOUNTER — Encounter: Payer: Self-pay | Admitting: Family

## 2015-10-10 VITALS — BP 119/70 | HR 71 | Temp 96.9°F

## 2015-10-10 DIAGNOSIS — E669 Obesity, unspecified: Secondary | ICD-10-CM

## 2015-10-10 DIAGNOSIS — I639 Cerebral infarction, unspecified: Secondary | ICD-10-CM | POA: Diagnosis not present

## 2015-10-10 DIAGNOSIS — E875 Hyperkalemia: Secondary | ICD-10-CM | POA: Diagnosis not present

## 2015-10-10 NOTE — Patient Instructions (Signed)
Hyperkalemia  Hyperkalemia is when you have too much potassium in your blood. Potassium is normally removed (excreted) from your body by your kidneys. If there is too much potassium in your blood, it can affect your heart's ability to function.   CAUSES   Hyperkalemia may be caused by:   · Taking in too much potassium. You can do this by:    Using salt substitutes. They contain large amounts of potassium.    Taking potassium supplements.    Eating foods high in potassium.  · Excreting too little potassium. This can happen if:    Your kidneys are not working properly. Kidney (renal) disease, including short- or long-term renal failure, is a very common cause of hyperkalemia.    You are taking medicines that lower your excretion of potassium.    You have Addison disease.    You have a urinary tract blockage, such as kidney stones.    You are on treatment to mechanically clean your blood (dialysis) and you skip a treatment.  · Releasing a high amount of potassium from your cells into your blood. This can happen with:    Injury to muscles (rhabdomyolysis) or other tissues. Most potassium is stored in your muscles.    Severe burns or infections.    Acidic blood plasma (acidosis). Acidosis can result from many diseases, such as uncontrolled diabetes.  RISK FACTORS  The most common risk factor of hyperkalemia is kidney disease. Other risk factors of hyperkalemia include:  · Addison disease. This is a condition where your glands do not produce enough hormones.  · Alcoholism or heavy drug use.    · Using certain blood pressure medicines, such as angiotensin-converting enzyme (ACE) inhibitors, angiotensin II receptor blockers (ARBs), or potassium-sparing diuretics such as spironolactone.  · Severe injury or burn.  SIGNS AND SYMPTOMS   Oftentimes, there are no signs or symptoms of hyperkalemia.  However, when your potassium level becomes high enough, you may experience symptoms such as:  · Irregular or very slow  heartbeat.  · Nausea.  · Fatigue.  · Tingling of the skin or numbness of the hands or feet.  · Muscle weakness.  · Fatigue.  · Not being able to move (paralysis).  You may not have any symptoms of hyperkalemia.   DIAGNOSIS   Hyperkalemia may be diagnosed by:  · Physical exam.  · Blood tests.  · ECG (electrocardiogram).  · Discussion of prescription and non-prescription drug use.  TREATMENT   Treatment for hyperkalemia is often directed at the underlying cause. In some instances, treatment may include:   · Insulin.  · Glucose (sugar) and water solution given through a vein (intravenous or IV ).  · Dialysis.  · Medicines to remove the potassium from your body.  · Medicines to move calcium from your bloodstream into your tissues.  HOME CARE INSTRUCTIONS   · Take medicines only as directed by your health care provider.  · Do not take any supplements, natural products, herbs, or vitamins without reviewing them with your health care provider. Certain supplements and natural food products can have high amounts of potassium.  · Limit your alcohol intake as directed by your health care provider.  · Stop illegal drug use. If you need help quitting, ask your health care provider.  · Keep all follow-up visits as directed by your health care provider. This is important.  · If you have kidney disease, you may need to follow a low potassium diet. A dietitian can help educate you on   low potassium foods.  SEEK MEDICAL CARE IF:   · You notice an irregular or very slow heartbeat.  · You feel light-headed.  · You feel weak.  · You are nauseous.  · You have tingling or numbness in your hands or feet.  SEEK IMMEDIATE MEDICAL CARE IF:   · You have shortness of breath.  · You have chest pain or discomfort.  · You pass out.  · You have muscle paralysis.  MAKE SURE YOU:   · Understand these instructions.  · Will watch your condition.  · Will get help right away if you are not doing well or get worse.     This information is not intended to  replace advice given to you by your health care provider. Make sure you discuss any questions you have with your health care provider.     Document Released: 07/06/2002 Document Revised: 08/06/2014 Document Reviewed: 10/21/2013  Elsevier Interactive Patient Education ©2016 [employer]

## 2015-10-10 NOTE — Progress Notes (Signed)
   Subjective:    Patient ID: [name], female    DOB: Nov 20, 1933, 82 y.o.   MRN: [id number]  HPI Pt presents to the office today to recheck potassium. Pt was seen in the office on 09/30/15 with potassium of 5.4. PT was suppose to have lab work redrawn, but wanted to see provider then get lab work drawn. Pt denies any palpations, SOB, or chest pains. PT denies any supplements of K+. Pt is currently taking Lasix as needed. Pt reports every 2 weeks. PT is on Losartan 50 mg daily.    Review of Systems  Constitutional: Negative.   HENT: Negative.   Eyes: Negative.   Respiratory: Negative.  Negative for shortness of breath.   Cardiovascular: Negative.  Negative for palpitations.  Gastrointestinal: Negative.   Endocrine: Negative.   Genitourinary: Negative.   Musculoskeletal: Negative.   Neurological: Negative.  Negative for headaches.  Hematological: Negative.   Psychiatric/Behavioral: Negative.   All other systems reviewed and are negative.      Objective:   Physical Exam  Constitutional: She is oriented to person, place, and time. She appears well-developed and well-nourished. No distress.  HENT:  Head: Normocephalic and atraumatic.  Eyes: Pupils are equal, round, and reactive to light.  Neck: Normal range of motion. Neck supple. No thyromegaly present.  Cardiovascular: Normal rate, regular rhythm, normal heart sounds and intact distal pulses.   No murmur heard. Pulmonary/Chest: Effort normal and breath sounds normal. No respiratory distress. She has no wheezes.  Abdominal: Soft. Bowel sounds are normal. She exhibits no distension. There is no tenderness.  Musculoskeletal: She exhibits no edema or tenderness.  PT in wheelchair   Neurological: She is alert and oriented to person, place, and time. She has normal reflexes. No cranial nerve deficit.  Skin: Skin is warm and dry.  Psychiatric: She has a normal mood and affect. Her behavior is normal. Judgment and thought content  normal.  Vitals reviewed.    BP 119/70 mmHg  Pulse 71  Temp(Src) 96.9 F (36.1 C) (Oral)      Assessment & Plan:  1. Obesity  2. Hyperkalemia - BMP8+EGFR   Continue all meds Labs pending Health Maintenance reviewed Diet and exercise encouraged RTO 6 months  Evelina Dun, FNP

## 2015-10-11 ENCOUNTER — Telehealth: Payer: Self-pay | Admitting: Family

## 2015-10-11 ENCOUNTER — Other Ambulatory Visit: Payer: Self-pay | Admitting: Family

## 2015-10-11 DIAGNOSIS — E875 Hyperkalemia: Secondary | ICD-10-CM

## 2015-10-11 DIAGNOSIS — H04123 Dry eye syndrome of bilateral lacrimal glands: Secondary | ICD-10-CM | POA: Diagnosis not present

## 2015-10-11 DIAGNOSIS — H40033 Anatomical narrow angle, bilateral: Secondary | ICD-10-CM | POA: Diagnosis not present

## 2015-10-11 LAB — BMP8+EGFR
BUN/Creatinine Ratio: 15 (ref 11–26)
BUN: 13 mg/dL (ref 8–27)
CALCIUM: 10 mg/dL (ref 8.7–10.3)
CO2: 24 mmol/L (ref 18–29)
CREATININE: 0.85 mg/dL (ref 0.57–1.00)
Chloride: 100 mmol/L (ref 96–106)
GFR calc Af Amer: 75 mL/min/{1.73_m2} (ref >59)
GFR, EST NON AFRICAN AMERICAN: 65 mL/min/{1.73_m2} (ref >59)
Glucose: 106 mg/dL — ABNORMAL HIGH (ref 65–99)
POTASSIUM: 5.8 mmol/L — AB (ref 3.5–5.2)
SODIUM: 141 mmol/L (ref 134–144)

## 2015-10-11 MED ORDER — SODIUM POLYSTYRENE SULFONATE 15 GM/60ML PO SUSP: 15.0000 g | Freq: Once | ORAL | Status: DC

## 2015-10-11 MED ORDER — SODIUM POLYSTYRENE SULFONATE 15 GM/60ML PO SUSP
15.0000 g | Freq: Once | ORAL | Status: DC
Start: 2015-10-11 — End: 2015-10-11

## 2015-10-11 NOTE — Telephone Encounter (Signed)
Patient aware that prescription has been sent to [facility]

## 2015-10-17 ENCOUNTER — Other Ambulatory Visit: Payer: Medicare Other

## 2015-10-17 ENCOUNTER — Encounter: Payer: Medicare Other | Admitting: Family

## 2015-10-17 DIAGNOSIS — E875 Hyperkalemia: Secondary | ICD-10-CM

## 2015-10-18 LAB — BMP8+EGFR
BUN / CREAT RATIO: 15 (ref 11–26)
BUN: 12 mg/dL (ref 8–27)
CHLORIDE: 101 mmol/L (ref 96–106)
CO2: 24 mmol/L (ref 18–29)
Calcium: 9.6 mg/dL (ref 8.7–10.3)
Creatinine, Ser: 0.8 mg/dL (ref 0.57–1.00)
GFR calc non Af Amer: 70 mL/min/{1.73_m2} (ref >59)
GFR, EST AFRICAN AMERICAN: 81 mL/min/{1.73_m2} (ref >59)
GLUCOSE: 99 mg/dL (ref 65–99)
POTASSIUM: 5.1 mmol/L (ref 3.5–5.2)
SODIUM: 142 mmol/L (ref 134–144)

## 2015-10-23 ENCOUNTER — Encounter (HOSPITAL_COMMUNITY): Payer: Self-pay | Admitting: Emergency Medicine

## 2015-10-23 ENCOUNTER — Emergency Department (HOSPITAL_COMMUNITY): Payer: Medicare Other

## 2015-10-23 ENCOUNTER — Observation Stay (HOSPITAL_COMMUNITY)
Admission: EM | Admit: 2015-10-23 | Discharge: 2015-10-25 | Disposition: A | Discharge status: Home / Self Care | Payer: Medicare Other | Attending: Internal Medicine | Admitting: Internal Medicine

## 2015-10-23 DIAGNOSIS — G40909 Epilepsy, unspecified, not intractable, without status epilepticus: Secondary | ICD-10-CM | POA: Diagnosis not present

## 2015-10-23 DIAGNOSIS — Z87891 Personal history of nicotine dependence: Secondary | ICD-10-CM | POA: Insufficient documentation

## 2015-10-23 DIAGNOSIS — I480 Paroxysmal atrial fibrillation: Secondary | ICD-10-CM | POA: Insufficient documentation

## 2015-10-23 DIAGNOSIS — E785 Hyperlipidemia, unspecified: Secondary | ICD-10-CM | POA: Diagnosis not present

## 2015-10-23 DIAGNOSIS — J9601 Acute respiratory failure with hypoxia: Secondary | ICD-10-CM

## 2015-10-23 DIAGNOSIS — J4551 Severe persistent asthma with (acute) exacerbation: Secondary | ICD-10-CM | POA: Diagnosis not present

## 2015-10-23 DIAGNOSIS — E039 Hypothyroidism, unspecified: Secondary | ICD-10-CM | POA: Diagnosis not present

## 2015-10-23 DIAGNOSIS — G319 Degenerative disease of nervous system, unspecified: Secondary | ICD-10-CM | POA: Insufficient documentation

## 2015-10-23 DIAGNOSIS — B9789 Other viral agents as the cause of diseases classified elsewhere: Secondary | ICD-10-CM

## 2015-10-23 DIAGNOSIS — I10 Essential (primary) hypertension: Secondary | ICD-10-CM | POA: Diagnosis not present

## 2015-10-23 DIAGNOSIS — R05 Cough: Secondary | ICD-10-CM | POA: Diagnosis not present

## 2015-10-23 DIAGNOSIS — J069 Acute upper respiratory infection, unspecified: Secondary | ICD-10-CM | POA: Diagnosis present

## 2015-10-23 DIAGNOSIS — Z8673 Personal history of transient ischemic attack (TIA), and cerebral infarction without residual deficits: Secondary | ICD-10-CM | POA: Insufficient documentation

## 2015-10-23 DIAGNOSIS — J45909 Unspecified asthma, uncomplicated: Secondary | ICD-10-CM | POA: Diagnosis not present

## 2015-10-23 DIAGNOSIS — R0602 Shortness of breath: Secondary | ICD-10-CM

## 2015-10-23 DIAGNOSIS — R062 Wheezing: Secondary | ICD-10-CM | POA: Diagnosis not present

## 2015-10-23 DIAGNOSIS — R0682 Tachypnea, not elsewhere classified: Secondary | ICD-10-CM | POA: Diagnosis not present

## 2015-10-23 DIAGNOSIS — R509 Fever, unspecified: Secondary | ICD-10-CM | POA: Diagnosis not present

## 2015-10-23 HISTORY — DX: Acute respiratory failure with hypoxia: J96.01

## 2015-10-23 LAB — CBC WITH DIFFERENTIAL/PLATELET
BASOS PCT: 1 %
Basophils Absolute: 0.1 10*3/uL (ref 0.0–0.1)
EOS ABS: 0.2 10*3/uL (ref 0.0–0.7)
EOS PCT: 2 %
HCT: 39.2 % (ref 36.0–46.0)
Hemoglobin: 12.5 g/dL (ref 12.0–15.0)
LYMPHS ABS: 2.2 10*3/uL (ref 0.7–4.0)
Lymphocytes Relative: 24 %
MCH: 28.9 pg (ref 26.0–34.0)
MCHC: 31.9 g/dL (ref 30.0–36.0)
MCV: 90.5 fL (ref 78.0–100.0)
MONO ABS: 0.7 10*3/uL (ref 0.1–1.0)
MONOS PCT: 8 %
NEUTROS PCT: 65 %
Neutro Abs: 6 10*3/uL (ref 1.7–7.7)
PLATELETS: 200 10*3/uL (ref 150–400)
RBC: 4.33 MIL/uL (ref 3.87–5.11)
RDW: 13.4 % (ref 11.5–15.5)
WBC: 9.1 10*3/uL (ref 4.0–10.5)

## 2015-10-23 LAB — BASIC METABOLIC PANEL
Anion gap: 12 (ref 5–15)
BUN: 7 mg/dL (ref 6–20)
CALCIUM: 9.3 mg/dL (ref 8.9–10.3)
CHLORIDE: 106 mmol/L (ref 101–111)
CO2: 21 mmol/L — AB (ref 22–32)
CREATININE: 0.82 mg/dL (ref 0.44–1.00)
GFR calc non Af Amer: >60 mL/min (ref >60)
Glucose, Bld: 140 mg/dL — ABNORMAL HIGH (ref 65–99)
Potassium: 3.6 mmol/L (ref 3.5–5.1)
Sodium: 139 mmol/L (ref 135–145)

## 2015-10-23 LAB — BRAIN NATRIURETIC PEPTIDE: B NATRIURETIC PEPTIDE 5: 90.7 pg/mL (ref 0.0–100.0)

## 2015-10-23 LAB — I-STAT TROPONIN, ED: TROPONIN I, POC: 0 ng/mL (ref 0.00–0.08)

## 2015-10-23 MED ORDER — HYDROCOD POLST-CPM POLST ER 10-8 MG/5ML PO SUER
5.0000 mL | Freq: Once | ORAL | Status: AC
  Administered 2015-10-23: 5 mL via ORAL
  Filled 2015-10-23: qty 5

## 2015-10-23 MED ORDER — IPRATROPIUM-ALBUTEROL 0.5-2.5 (3) MG/3ML IN SOLN
3.0000 mL | RESPIRATORY_TRACT | Status: DC
  Administered 2015-10-23 – 2015-10-24 (×3): 3 mL via RESPIRATORY_TRACT
  Filled 2015-10-23 (×2): qty 3

## 2015-10-23 MED ORDER — METHYLPREDNISOLONE SODIUM SUCC 125 MG IJ SOLR
125.0000 mg | Freq: Once | INTRAMUSCULAR | Status: DC
  Filled 2015-10-23: qty 2

## 2015-10-23 MED ORDER — DEXAMETHASONE SODIUM PHOSPHATE 10 MG/ML IJ SOLN
10.0000 mg | Freq: Once | INTRAMUSCULAR | Status: AC
  Administered 2015-10-23: 10 mg via INTRAMUSCULAR
  Filled 2015-10-23: qty 1

## 2015-10-23 MED ORDER — ALBUTEROL SULFATE HFA 108 (90 BASE) MCG/ACT IN AERS
2.0000 | INHALATION_SPRAY | Freq: Once | RESPIRATORY_TRACT | Status: AC
  Administered 2015-10-23: 2 via RESPIRATORY_TRACT
  Filled 2015-10-23: qty 6.7

## 2015-10-23 MED ORDER — HYDROCOD POLST-CPM POLST ER 10-8 MG/5ML PO SUER
5.0000 mL | Freq: Two times a day (BID) | ORAL | Status: DC | PRN
  Administered 2015-10-24 – 2015-10-25 (×2): 5 mL via ORAL
  Filled 2015-10-23 (×3): qty 5

## 2015-10-23 MED ORDER — MAGNESIUM SULFATE 2 GM/50ML IV SOLN
2.0000 g | Freq: Once | INTRAVENOUS | Status: AC
  Administered 2015-10-24: 2 g via INTRAVENOUS
  Filled 2015-10-23: qty 50

## 2015-10-23 MED ORDER — IPRATROPIUM-ALBUTEROL 0.5-2.5 (3) MG/3ML IN SOLN
3.0000 mL | RESPIRATORY_TRACT | Status: DC
  Administered 2015-10-24 (×4): 3 mL via RESPIRATORY_TRACT
  Filled 2015-10-23 (×6): qty 3

## 2015-10-23 NOTE — ED Notes (Signed)
Small red area to pts right buttocks. Pt states her cat scratched her.

## 2015-10-23 NOTE — H&P (Signed)
[facility] Admission History and Physical       [name] QMV:[id number] DOB: 1934-03-14 DOA: 10/23/2015  Referring physician: EDP PCP: Evelina Dun, FNP  Specialists:   Chief Complaint: SOB and Wheezing  HPI: [name] is a 82 y.o. female with a history of PAF, Seizures, Hypothyroid, Hyperlipidemia, Cerebellar Degeneration and previous CVA who presents to the ED with complaints of Cough, SOB and Wheezing and Flu like symptoms x 5 days.   She has had subjective fevers and chills cough productive of whitish sputum and body aches.    She was so SOB she called EMS and was brought to the ED , and on route she was given IV solumedrol and Nebulizer Treatments.  She was more Neb treatments in the ED, and IV Decadron but continued to have Wheezing and increased Coughing.   A chest X-ray revealed Bilateral Atelectasis, and Mild Cardiomegaly, but no signs of pneumonia.   She was referred for admission.     Review of Systems:    Constitutional: No Weight Loss, No Weight Gain, Night Sweats, +Fevers, +Chills, + Myalgias, Dizziness, Light Headedness, Fatigue, or Generalized Weakness HEENT: No Headaches, Difficulty Swallowing,Tooth/Dental Problems,Sore Throat,  No Sneezing, Rhinitis, Ear Ache, Nasal Congestion, or Post Nasal Drip,  Cardio-vascular:  No Chest pain, Orthopnea, PND, Edema in Lower Extremities, Anasarca, Dizziness, Palpitations  Resp: +Dyspnea, No DOE,+Productive Cough, No Non-Productive Cough, No Hemoptysis, +Wheezing.    GI: No Heartburn, Indigestion, Abdominal Pain, Nausea, Vomiting, Diarrhea, Constipation, Hematemesis, Hematochezia, Melena, Change in Bowel Habits,  Loss of Appetite  GU: No Dysuria, No Change in Color of Urine, No Urgency or Urinary Frequency, No Flank pain.  Musculoskeletal: No Joint Pain or Swelling, No Decreased Range of Motion, No Back Pain.  Neurologic: No Syncope, No Seizures, Muscle Weakness, Paresthesia, Vision Disturbance or Loss, No Diplopia, No  Vertigo, No Difficulty Walking,  Skin: No Rash or Lesions. Psych: No Change in Mood or Affect, No Depression or Anxiety, No Memory loss, No Confusion, or Hallucinations   Past Medical History  Diagnosis Date  . IBS (irritable bowel syndrome)   . Hypertension   . Hyperlipidemia   . GERD (gastroesophageal reflux disease)   . Depression   . Chronic pericarditis   . CVA (cerebral infarction) 05/2003  . Seizure disorder ([place])   . TIA (transient ischemic attack)   . Anxiety   . Chronic urinary tract infection   . Cerebellar degeneration   . Allergic rhinitis     PT. DENIES  . Arthritis     NECK  . Cataract   . Thyroid disease   . Seizures ([place])   . Hypothyroidism   . Burning tongue syndrome 25 years  . Complication of anesthesia     low o2 sats, coded 30 years ago  . Stroke [facility]) 5 years ago    Right side of face weak, slurred speach  . Gastric polyps   . Personal history of arterial venous malformation (AVM)     right side of face  . Sternum fx 10/27/2013  . High cholesterol   . Ataxia   . Gait disorder   . Bradycardia     primarily nocturnal  . Hypotension   . Obesity   . Hypertension   . Paroxysmal atrial fibrillation ([name])     chads2vasc score is 6,  she is felt to be a poor candidate for anticoagulation     Past Surgical History  Procedure Laterality Date  . Knee arthroscopy Right 11/14/2006  . Tibial  and fibular internal fixation Left   . Upper gastrointestinal endoscopy  2009, 2013  . Colonoscopy  2006, 2009  . Appendectomy  82 years old  . Cyst removed  35 years ago  . Total abdominal hysterectomy  82 years old  . Knee arthroscopy Bilateral 5 and 6 years ago  . Knee arthroscopy with lateral menisectomy  07/03/2012    Procedure: KNEE ARTHROSCOPY WITH LATERAL MENISECTOMY;  Surgeon: Magnus Sinning, MD;  Location: [facility];  Service: Orthopedics;  Laterality: Left;  with Partial Lateral Menisectomy and Medial Menisectomy. Shaving of medial and lateral femoral  condyles. Shaving of patella. Removal of a loose body  . Ep implantable device N/A 01/06/2015    Procedure: Loop Recorder Insertion;  Surgeon: Thompson Grayer, MD;  Location: [place] CV [facility];  Service: Cardiovascular;  Laterality: N/A;      Prior to Admission medications   Medication Sig Start Date End Date Taking? Authorizing Provider  amitriptyline (ELAVIL) 50 MG tablet Take 0.5 tablets (25 mg total) by mouth at bedtime. 09/30/15  Yes Sharion Balloon, FNP  escitalopram (LEXAPRO) 20 MG tablet Take 1 tablet (20 mg total) by mouth at bedtime. 09/30/15  Yes Sharion Balloon, FNP  fluticasone (FLONASE) 50 MCG/ACT nasal spray Place 2 sprays into both nostrils daily. 09/30/15  Yes Sharion Balloon, FNP  furosemide (LASIX) 40 MG tablet Take 1 tablet (40 mg total) by mouth daily as needed for fluid. 09/30/15  Yes Sharion Balloon, FNP  gabapentin (NEURONTIN) 300 MG capsule Take 1 capsule (300 mg total) by mouth 3 (three) times daily. 09/30/15  Yes Sharion Balloon, FNP  levothyroxine (SYNTHROID, LEVOTHROID) 75 MCG tablet Take 1 tablet (75 mcg total) by mouth daily before breakfast. 09/30/15  Yes Sharion Balloon, FNP  losartan (COZAAR) 50 MG tablet Take 1 tablet (50 mg total) by mouth daily. Patient taking differently: Take 50 mg by mouth at bedtime.  09/30/15  Yes Sharion Balloon, FNP  lovastatin (MEVACOR) 40 MG tablet Take 1 tablet (40 mg total) by mouth at bedtime. 09/30/15  Yes Sharion Balloon, FNP  omeprazole (PRILOSEC) 40 MG capsule Take 1 capsule (40 mg total) by mouth daily. 09/30/15  Yes Sharion Balloon, FNP  traZODone (DESYREL) 150 MG tablet 1/2 to 1 at bedtime as needed for sleep Patient taking differently: Take 150 mg by mouth at bedtime as needed for sleep.  09/30/15  Yes Sharion Balloon, FNP  vitamin B-12 (CYANOCOBALAMIN) 1000 MCG tablet Take 1,000 mcg by mouth every 14 (fourteen) days.    Yes Historical Provider, MD  amLODipine (NORVASC) 5 MG tablet TAKE 1 TABLET DAILY 10/09/15   Sharion Balloon, FNP      Allergies  Allergen Reactions  . Lisinopril Cough    Social History:  reports that she quit smoking about 40 years ago. Her smoking use included Cigarettes. She has a 2.5 pack-year smoking history. She has never used smokeless tobacco. She reports that she drinks alcohol. She reports that she does not use illicit drugs.    Family History  Problem Relation Age of Onset  . Heart attack Father 73    fatal  . Coronary artery disease Brother   . Diabetes Brother   . Prostate cancer Brother   . Prostate cancer Son        Physical Exam:  GEN:  Pleasant  82 y.o. female examined and in no acute distress; cooperative with exam Filed Vitals:   10/23/15 2200 10/23/15 2215  10/23/15 2230 10/23/15 2245  BP: 124/61 113/57 112/57 147/73  Pulse: 92 87 87 90  Temp:      TempSrc:      Resp: '21 19 16 17  '$ Height:      Weight:      SpO2: 91% 93% 94% 91%   Blood pressure 147/73, pulse 90, temperature 99.2 F (37.3 C), temperature source Oral, resp. rate 17, height '5\' 3"'$  (1.6 m), weight 99.791 kg (220 lb), SpO2 91 %. PSYCH: She is alert and oriented x4; does not appear anxious does not appear depressed; affect is normal HEENT: Normocephalic and Atraumatic, Mucous membranes pink; PERRLA; EOM intact; Fundi:  Benign;  No scleral icterus, Nares: Patent, Oropharynx: Clear, Fair Dentition,    Neck:  FROM, No Cervical Lymphadenopathy nor Thyromegaly or Carotid Bruit; No JVD; Breasts:: Not examined CHEST WALL: No tenderness CHEST: Normal respiration, clear to auscultation bilaterally HEART: Regular rate and rhythm; no murmurs rubs or gallops BACK: No kyphosis or scoliosis; No CVA tenderness ABDOMEN: Positive Bowel Sounds, Soft Non-Tender, No Rebound or Guarding; No Masses, No Organomegaly Rectal Exam: Not done EXTREMITIES: No Cyanosis, Clubbing, or Edema; No Ulcerations. Genitalia: not examined PULSES: 2+ and symmetric SKIN: Normal hydration no rash or ulceration CNS:  Alert and Oriented x 4,  No Focal Deficits Vascular: pulses palpable throughout    Labs on Admission:  Basic Metabolic Panel:  Recent Labs Lab 10/17/15 1512 10/23/15 2112  NA 142 139  K 5.1 3.6  CL 101 106  CO2 24 21*  GLUCOSE 99 140*  BUN 12 7  CREATININE 0.80 0.82  CALCIUM 9.6 9.3   Liver Function Tests: No results for input(s): AST, ALT, ALKPHOS, BILITOT, PROT, ALBUMIN in the last 168 hours. No results for input(s): LIPASE, AMYLASE in the last 168 hours. No results for input(s): AMMONIA in the last 168 hours. CBC:  Recent Labs Lab 10/23/15 2112  WBC 9.1  NEUTROABS 6.0  HGB 12.5  HCT 39.2  MCV 90.5  PLT 200   Cardiac Enzymes: No results for input(s): CKTOTAL, CKMB, CKMBINDEX, TROPONINI in the last 168 hours.  BNP (last 3 results)  Recent Labs  11/24/14 1538 10/23/15 2112  BNP 56.1 90.7    ProBNP (last 3 results) No results for input(s): PROBNP in the last 8760 hours.  CBG: No results for input(s): GLUCAP in the last 168 hours.  Radiological Exams on Admission: Dg Chest 2 View  10/23/2015  CLINICAL DATA:  Acute onset of cough, fever, sore throat and shortness of breath. Initial encounter. EXAM: CHEST  2 VIEW COMPARISON:  Chest radiograph performed 04/14/2015, and CT of the chest performed 05/10/2015 FINDINGS: There is elevation of the right hemidiaphragm. Mild bibasilar atelectasis is noted. Mild vascular congestion is seen. There is no evidence of pleural effusion or pneumothorax. The heart is mildly enlarged. A loop recorder is noted. No acute osseous abnormalities are seen. Scattered calcification is noted along the proximal abdominal aorta. IMPRESSION: Elevation of the right hemidiaphragm. Mild bibasilar atelectasis. Mild vascular congestion and mild cardiomegaly. Electronically Signed   By: Garald Balding M.D.   On: 10/23/2015 21:48     EKG: Independently reviewed.     Assessment/Plan:     82 y.o. female with  Active Problems:    Acute respiratory failure with hypoxia  ([name])    NCO2 PRN    Monitor O2 Sats      Asthmatic bronchitis/ Viral URI with cough    Steroid Taper    DuoNebs  Tussionex PRN Cough    Check Influenza Studies      SOB (shortness of breath)    NCO2 PRN        Essential hypertension    Continue Lasix, Amlodipine and Losartan as BP tolerates    Monitor BPs      Seizure disorder ([name])    Continue Gabapentin Rx      Paroxysmal atrial fibrillation ([name])    Chad2 Vasc Score = 6, not a Coumadin Candidate due to Seizures    Hyperlipemia    Continue Lovastatin Rx       DVT Prophylaxis    Lovenox      Code Status:     FULL CODE       Family Communication:   Husband at Bedside    Disposition Plan:   Inpatient Status with expected LOS 2-3 days  Time spent: 13 Minutes      Theressa Millard [facility] Pager [phone]   If 7AM -7PM Please Contact the Day Rounding Team MD for [facility]  If 7PM-7AM, Please Contact Night-Floor Coverage  [email or web address] Password [facility] 10/23/2015, 11:04 PM     ADDENDUM:   Patient was seen and examined on 10/23/2015

## 2015-10-23 NOTE — ED Provider Notes (Signed)
CSN: [id number]     Arrival date & time 10/23/15  2030 History   First MD Initiated Contact with Patient 10/23/15 2034     Chief Complaint  Patient presents with  . Shortness of Breath     (Consider location/radiation/quality/duration/timing/severity/associated sxs/prior Treatment) HPI [name] is a 82 y.o. female with multiple medical problems with a history of hypertension, CVA, paroxysmal A. fib, comes in for evaluation of shortness of breath. Patient reports earlier this week she felt like she may have had the flu and she has been coughing with diffuse body aches and labored breathing over the past 5 days. She reports she felt like she was getting better, but today her symptoms worsened and she had more labored breathing. She reports her cough is usually dry. Denies chest pain, leg swelling, no hemoptysis. No nausea, vomiting, diaphoresis, numbness or weakness, PND. No other modifying factors.  PCP: Evelina Dun, [facility]  Past Medical History  Diagnosis Date  . IBS (irritable bowel syndrome)   . Hypertension   . Hyperlipidemia   . GERD (gastroesophageal reflux disease)   . Depression   . Chronic pericarditis   . CVA (cerebral infarction) 05/2003  . Seizure disorder ([place])   . TIA (transient ischemic attack)   . Anxiety   . Chronic urinary tract infection   . Cerebellar degeneration   . Allergic rhinitis     PT. DENIES  . Arthritis     NECK  . Cataract   . Thyroid disease   . Seizures ([place])   . Hypothyroidism   . Burning tongue syndrome 25 years  . Complication of anesthesia     low o2 sats, coded 30 years ago  . Stroke [facility]) 5 years ago    Right side of face weak, slurred speach  . Gastric polyps   . Personal history of arterial venous malformation (AVM)     right side of face  . Sternum fx 10/27/2013  . High cholesterol   . Ataxia   . Gait disorder   . Bradycardia     primarily nocturnal  . Hypotension   . Obesity   .  Hypertension   . Paroxysmal atrial fibrillation ([name])     chads2vasc score is 6,  she is felt to be a poor candidate for anticoagulation   Past Surgical History  Procedure Laterality Date  . Knee arthroscopy Right 11/14/2006  . Tibial and fibular internal fixation Left   . Upper gastrointestinal endoscopy  2009, 2013  . Colonoscopy  2006, 2009  . Appendectomy  82 years old  . Cyst removed  35 years ago  . Total abdominal hysterectomy  82 years old  . Knee arthroscopy Bilateral 5 and 6 years ago  . Knee arthroscopy with lateral menisectomy  07/03/2012    Procedure: KNEE ARTHROSCOPY WITH LATERAL MENISECTOMY;  Surgeon: Magnus Sinning, MD;  Location: [facility];  Service: Orthopedics;  Laterality: Left;  with Partial Lateral Menisectomy and Medial Menisectomy. Shaving of medial and lateral femoral condyles. Shaving of patella. Removal of a loose body  . Ep implantable device N/A 01/06/2015    Procedure: Loop Recorder Insertion;  Surgeon: Thompson Grayer, MD;  Location: [place] CV LAB;  Service: Cardiovascular;  Laterality: N/A;   Family History  Problem Relation Age of Onset  . Heart attack Father 39    fatal  . Coronary artery disease Brother   . Diabetes Brother   . Prostate cancer Brother   . Prostate cancer  Son    Social History  Substance Use Topics  . Smoking status: Former Smoker -- 0.25 packs/day for 10 years    Types: Cigarettes    Quit date: 07/31/1975  . Smokeless tobacco: Never Used  . Alcohol Use: Yes     Comment: 2 glasses a year   OB History    No data available     Review of Systems A 10 point review of systems was completed and was negative except for pertinent positives and negatives as mentioned in the history of present illness     Allergies  Lisinopril  Home Medications   Prior to Admission medications   Medication Sig Start Date End Date Taking? Authorizing Provider  amitriptyline (ELAVIL) 50 MG tablet Take 0.5 tablets (25 mg total) by mouth at  bedtime. 09/30/15  Yes Sharion Balloon, FNP  escitalopram (LEXAPRO) 20 MG tablet Take 1 tablet (20 mg total) by mouth at bedtime. 09/30/15  Yes Sharion Balloon, FNP  fluticasone (FLONASE) 50 MCG/ACT nasal spray Place 2 sprays into both nostrils daily. 09/30/15  Yes Sharion Balloon, FNP  furosemide (LASIX) 40 MG tablet Take 1 tablet (40 mg total) by mouth daily as needed for fluid. 09/30/15  Yes Sharion Balloon, FNP  gabapentin (NEURONTIN) 300 MG capsule Take 1 capsule (300 mg total) by mouth 3 (three) times daily. 09/30/15  Yes Sharion Balloon, FNP  levothyroxine (SYNTHROID, LEVOTHROID) 75 MCG tablet Take 1 tablet (75 mcg total) by mouth daily before breakfast. 09/30/15  Yes Sharion Balloon, FNP  losartan (COZAAR) 50 MG tablet Take 1 tablet (50 mg total) by mouth daily. Patient taking differently: Take 50 mg by mouth at bedtime.  09/30/15  Yes Sharion Balloon, FNP  lovastatin (MEVACOR) 40 MG tablet Take 1 tablet (40 mg total) by mouth at bedtime. 09/30/15  Yes Sharion Balloon, FNP  omeprazole (PRILOSEC) 40 MG capsule Take 1 capsule (40 mg total) by mouth daily. 09/30/15  Yes Sharion Balloon, FNP  traZODone (DESYREL) 150 MG tablet 1/2 to 1 at bedtime as needed for sleep Patient taking differently: Take 150 mg by mouth at bedtime as needed for sleep.  09/30/15  Yes Sharion Balloon, FNP  vitamin B-12 (CYANOCOBALAMIN) 1000 MCG tablet Take 1,000 mcg by mouth every 14 (fourteen) days.    Yes Historical Provider, MD  amLODipine (NORVASC) 5 MG tablet TAKE 1 TABLET DAILY 10/09/15   Christy A Hawks, FNP   BP 147/73 mmHg  Pulse 90  Temp(Src) 99.2 F (37.3 C) (Oral)  Resp 17  Ht '5\' 3"'$  (1.6 m)  Wt 99.791 kg  BMI 38.98 kg/m2  SpO2 91% Physical Exam  Constitutional: She is oriented to person, place, and time. She appears well-developed and well-nourished.  Overall well-appearing Caucasian female  HENT:  Head: Normocephalic and atraumatic.  Mouth/Throat: Oropharynx is clear and moist.  Eyes: Conjunctivae are normal. Pupils  are equal, round, and reactive to light. Right eye exhibits no discharge. Left eye exhibits no discharge. No scleral icterus.  Neck: Neck supple.  Cardiovascular: Normal rate, regular rhythm and normal heart sounds.   Pulmonary/Chest: Effort normal and breath sounds normal. No respiratory distress.  Diffuse wheezing in all fields.  Abdominal: Soft. There is no tenderness.  Musculoskeletal: She exhibits no edema or tenderness.  Neurological: She is alert and oriented to person, place, and time.  Cranial Nerves II-XII grossly intact  Skin: Skin is warm and dry. No rash noted.  Psychiatric: She has a normal  mood and affect.  Nursing note and vitals reviewed.   ED Course  Procedures (including critical care time) Labs Review Labs Reviewed  BASIC METABOLIC PANEL - Abnormal; Notable for the following:    CO2 21 (*)    Glucose, Bld 140 (*)    All other components within normal limits  CBC WITH DIFFERENTIAL/PLATELET  BRAIN NATRIURETIC PEPTIDE  INFLUENZA PANEL BY PCR (TYPE A & B, H1N1)  I-STAT TROPOININ, ED    Imaging Review Dg Chest 2 View  10/23/2015  CLINICAL DATA:  Acute onset of cough, fever, sore throat and shortness of breath. Initial encounter. EXAM: CHEST  2 VIEW COMPARISON:  Chest radiograph performed 04/14/2015, and CT of the chest performed 05/10/2015 FINDINGS: There is elevation of the right hemidiaphragm. Mild bibasilar atelectasis is noted. Mild vascular congestion is seen. There is no evidence of pleural effusion or pneumothorax. The heart is mildly enlarged. A loop recorder is noted. No acute osseous abnormalities are seen. Scattered calcification is noted along the proximal abdominal aorta. IMPRESSION: Elevation of the right hemidiaphragm. Mild bibasilar atelectasis. Mild vascular congestion and mild cardiomegaly. Electronically Signed   By: Garald Balding M.D.   On: 10/23/2015 21:48   I have personally reviewed and evaluated these images and lab results as part of my medical  decision-making.   EKG Interpretation   Date/Time:  Sunday October 23 2015 20:42:02 EDT Ventricular Rate:  100 PR Interval:  166 QRS Duration: 86 QT Interval:  364 QTC Calculation: 469 R Axis:   39 Text Interpretation:  Sinus tachycardia Borderline T abnormalities,  inferior leads Sinus tachycardia T wave abnormality Artifact Abnormal ekg  Confirmed by Carmin Muskrat  MD [phone]) on 10/23/2015 10:19:47 PM     Meds given in ED:  Medications  ipratropium-albuterol (DUONEB) 0.5-2.5 (3) MG/3ML nebulizer solution 3 mL (3 mLs Nebulization Given 10/23/15 2254)  dexamethasone (DECADRON) injection 10 mg (10 mg Intramuscular Given 10/23/15 2240)  albuterol (PROVENTIL HFA;VENTOLIN HFA) 108 (90 Base) MCG/ACT inhaler 2 puff (2 puffs Inhalation Given 10/23/15 2241)    New Prescriptions   No medications on file   Filed Vitals:   10/23/15 2200 10/23/15 2215 10/23/15 2230 10/23/15 2245  BP: 124/61 113/57 112/57 147/73  Pulse: 92 87 87 90  Temp:      TempSrc:      Resp: '21 19 16 17  '$ Height:      Weight:      SpO2: 91% 93% 94% 91%    MDM  Travis Purk is a 82 y.o. female who comes in for evaluation of cough and increased labored breathing over the past 1 week. Reports symptoms were originally getting better, but worsened today. On arrival, patient does have a mild tachycardia, but this quickly resolves, suspect movement versus agitation. The patient also has a history of paroxysmal A. fib., EKG shows sinus rhythm with improved tachycardia. On exam, she does have mild, diffuse wheezing. Given DuoNeb breathing treatment and will reassess. Also treated with 10 mg IM Decadron. Pending screening labs, BNP, chest x-ray.  Labs are overall unremarkable. Chest x-ray with mild vascular congestion, mild cardiomegaly and bibasilar atelectasis but no other acute cardiopulmonary findings. After breathing treatment, patient is still audibly wheezing and appears to have increased work of breathing. Patient is  overall well-appearing, nontoxic and hemodynamically stable. Discussed with my attending, Dr. Vanita Panda who also saw and evaluated the patient and agrees it is reasonable for medical admission for overnight observation. Consult to hospitalist Discussed with Dr. Arnoldo Morale, will obtain flu  panel, patient admitted telemetry bed for observation.   Final diagnoses:  Shortness of breath       Comer Locket, PA-C 10/23/15 2308  Carmin Muskrat, MD 10/24/15 [phone]

## 2015-10-23 NOTE — ED Notes (Signed)
Pt c/o cough,congestion and diff breathing x 5days

## 2015-10-24 ENCOUNTER — Observation Stay (HOSPITAL_COMMUNITY): Payer: Medicare Other

## 2015-10-24 DIAGNOSIS — J4551 Severe persistent asthma with (acute) exacerbation: Secondary | ICD-10-CM | POA: Diagnosis not present

## 2015-10-24 DIAGNOSIS — R0600 Dyspnea, unspecified: Secondary | ICD-10-CM | POA: Diagnosis not present

## 2015-10-24 DIAGNOSIS — J069 Acute upper respiratory infection, unspecified: Secondary | ICD-10-CM | POA: Diagnosis not present

## 2015-10-24 DIAGNOSIS — I10 Essential (primary) hypertension: Secondary | ICD-10-CM | POA: Diagnosis not present

## 2015-10-24 DIAGNOSIS — R0609 Other forms of dyspnea: Secondary | ICD-10-CM

## 2015-10-24 DIAGNOSIS — R0602 Shortness of breath: Secondary | ICD-10-CM | POA: Diagnosis not present

## 2015-10-24 LAB — ECHOCARDIOGRAM COMPLETE
HEIGHTINCHES: 63 in
Weight: 3785.6 oz

## 2015-10-24 LAB — CBC
HEMATOCRIT: 37.5 % (ref 36.0–46.0)
Hemoglobin: 12.2 g/dL (ref 12.0–15.0)
MCH: 29.4 pg (ref 26.0–34.0)
MCHC: 32.5 g/dL (ref 30.0–36.0)
MCV: 90.4 fL (ref 78.0–100.0)
PLATELETS: 200 10*3/uL (ref 150–400)
RBC: 4.15 MIL/uL (ref 3.87–5.11)
RDW: 13.6 % (ref 11.5–15.5)
WBC: 9 10*3/uL (ref 4.0–10.5)

## 2015-10-24 LAB — BASIC METABOLIC PANEL
Anion gap: 13 (ref 5–15)
BUN: 9 mg/dL (ref 6–20)
CO2: 21 mmol/L — ABNORMAL LOW (ref 22–32)
CREATININE: 0.87 mg/dL (ref 0.44–1.00)
Calcium: 9.3 mg/dL (ref 8.9–10.3)
Chloride: 103 mmol/L (ref 101–111)
GFR calc Af Amer: >60 mL/min (ref >60)
GLUCOSE: 175 mg/dL — AB (ref 65–99)
Potassium: 3.7 mmol/L (ref 3.5–5.1)
SODIUM: 137 mmol/L (ref 135–145)

## 2015-10-24 LAB — INFLUENZA PANEL BY PCR (TYPE A & B)
H1N1FLUPCR: NOT DETECTED
Influenza A By PCR: NEGATIVE
Influenza B By PCR: NEGATIVE

## 2015-10-24 MED ORDER — SODIUM CHLORIDE 0.9% FLUSH
3.0000 mL | Freq: Two times a day (BID) | INTRAVENOUS | Status: DC
  Administered 2015-10-24 (×2): 3 mL via INTRAVENOUS

## 2015-10-24 MED ORDER — IPRATROPIUM-ALBUTEROL 0.5-2.5 (3) MG/3ML IN SOLN
3.0000 mL | Freq: Three times a day (TID) | RESPIRATORY_TRACT | Status: DC
  Administered 2015-10-25: 3 mL via RESPIRATORY_TRACT
  Filled 2015-10-24: qty 3

## 2015-10-24 MED ORDER — LOSARTAN POTASSIUM 50 MG PO TABS
50.0000 mg | ORAL_TABLET | Freq: Every day | ORAL | Status: DC
  Administered 2015-10-24 (×2): 50 mg via ORAL
  Filled 2015-10-24 (×2): qty 1

## 2015-10-24 MED ORDER — ACETAMINOPHEN 325 MG PO TABS: 650.0000 mg | ORAL_TABLET | Freq: Four times a day (QID) | ORAL | Status: DC | PRN

## 2015-10-24 MED ORDER — TRAZODONE HCL 150 MG PO TABS
150.0000 mg | ORAL_TABLET | Freq: Every evening | ORAL | Status: DC | PRN
  Administered 2015-10-24: 150 mg via ORAL
  Filled 2015-10-24: qty 1

## 2015-10-24 MED ORDER — ONDANSETRON HCL 4 MG/2ML IJ SOLN: 4.0000 mg | Freq: Four times a day (QID) | INTRAMUSCULAR | Status: DC | PRN

## 2015-10-24 MED ORDER — PRAVASTATIN SODIUM 40 MG PO TABS
40.0000 mg | ORAL_TABLET | Freq: Every day | ORAL | Status: DC
  Administered 2015-10-24: 40 mg via ORAL
  Filled 2015-10-24: qty 1

## 2015-10-24 MED ORDER — ONDANSETRON HCL 4 MG PO TABS: 4.0000 mg | ORAL_TABLET | Freq: Four times a day (QID) | ORAL | Status: DC | PRN

## 2015-10-24 MED ORDER — CETYLPYRIDINIUM CHLORIDE 0.05 % MT LIQD
7.0000 mL | Freq: Two times a day (BID) | OROMUCOSAL | Status: DC
  Administered 2015-10-24 – 2015-10-25 (×4): 7 mL via OROMUCOSAL

## 2015-10-24 MED ORDER — AZITHROMYCIN 500 MG PO TABS
500.0000 mg | ORAL_TABLET | Freq: Every day | ORAL | Status: AC
  Administered 2015-10-24: 500 mg via ORAL
  Filled 2015-10-24: qty 1

## 2015-10-24 MED ORDER — AZITHROMYCIN 250 MG PO TABS
250.0000 mg | ORAL_TABLET | Freq: Every day | ORAL | Status: DC
  Administered 2015-10-25: 250 mg via ORAL
  Filled 2015-10-24: qty 1

## 2015-10-24 MED ORDER — ENOXAPARIN SODIUM 60 MG/0.6ML ~~LOC~~ SOLN
50.0000 mg | SUBCUTANEOUS | Status: DC
  Administered 2015-10-24: 50 mg via SUBCUTANEOUS
  Filled 2015-10-24 (×2): qty 0.6

## 2015-10-24 MED ORDER — AMITRIPTYLINE HCL 25 MG PO TABS
25.0000 mg | ORAL_TABLET | Freq: Every day | ORAL | Status: DC
  Administered 2015-10-24 (×2): 25 mg via ORAL
  Filled 2015-10-24 (×2): qty 1

## 2015-10-24 MED ORDER — SODIUM CHLORIDE 0.9 % IV SOLN
INTRAVENOUS | Status: DC
  Administered 2015-10-24: 03:00:00 via INTRAVENOUS

## 2015-10-24 MED ORDER — HYDROMORPHONE HCL 1 MG/ML IJ SOLN: 0.5000 mg | INTRAMUSCULAR | Status: DC | PRN

## 2015-10-24 MED ORDER — PANTOPRAZOLE SODIUM 40 MG PO TBEC
40.0000 mg | DELAYED_RELEASE_TABLET | Freq: Every day | ORAL | Status: DC
  Administered 2015-10-24 – 2015-10-25 (×2): 40 mg via ORAL
  Filled 2015-10-24 (×2): qty 1

## 2015-10-24 MED ORDER — OXYCODONE HCL 5 MG PO TABS
5.0000 mg | ORAL_TABLET | ORAL | Status: DC | PRN
  Administered 2015-10-24: 5 mg via ORAL
  Filled 2015-10-24: qty 1

## 2015-10-24 MED ORDER — PREDNISONE 20 MG PO TABS
40.0000 mg | ORAL_TABLET | Freq: Every day | ORAL | Status: DC
  Administered 2015-10-25: 40 mg via ORAL
  Filled 2015-10-24: qty 2

## 2015-10-24 MED ORDER — ESCITALOPRAM OXALATE 10 MG PO TABS
20.0000 mg | ORAL_TABLET | Freq: Every day | ORAL | Status: DC
  Administered 2015-10-24 (×2): 20 mg via ORAL
  Filled 2015-10-24 (×2): qty 2

## 2015-10-24 MED ORDER — GABAPENTIN 300 MG PO CAPS
300.0000 mg | ORAL_CAPSULE | Freq: Three times a day (TID) | ORAL | Status: DC
  Administered 2015-10-24 – 2015-10-25 (×4): 300 mg via ORAL
  Filled 2015-10-24 (×4): qty 1

## 2015-10-24 MED ORDER — FUROSEMIDE 40 MG PO TABS
40.0000 mg | ORAL_TABLET | Freq: Every day | ORAL | Status: DC | PRN
Start: 2015-10-24 — End: 2015-10-25

## 2015-10-24 MED ORDER — LEVOTHYROXINE SODIUM 75 MCG PO TABS
75.0000 ug | ORAL_TABLET | Freq: Every day | ORAL | Status: DC
  Administered 2015-10-24 – 2015-10-25 (×2): 75 ug via ORAL
  Filled 2015-10-24 (×2): qty 1

## 2015-10-24 MED ORDER — ACETAMINOPHEN 650 MG RE SUPP: 650.0000 mg | Freq: Four times a day (QID) | RECTAL | Status: DC | PRN

## 2015-10-24 NOTE — Progress Notes (Deleted)
SATURATION QUALIFICATIONS: (This note is used to comply with regulatory documentation for home oxygen)  Patient Saturations on Room Air at Rest = 96%  Patient Saturations on Room Air while Ambulating = 90%  Patient Saturations on  Liters of oxygen while Ambulating = %  Please briefly explain why patient needs home oxygen:

## 2015-10-24 NOTE — Care Management Note (Signed)
Case Management Note  Patient Details  Name: [name] MRN: [id number] Date of Birth: 08-30-1933  Subjective/Objective:      SOB, Cough, congestion, URI              Action/Plan: Discharge Planning: NCM spoke to pt and husband at bedside. Pt states she does not have oxygen at home. Able to afford medications. She uses a wheelchair at home. Pt may need oxygen at dc. Waiting final recommendations. NCM will continue to follow for dc needs.   PCP- Sharion Balloon MD   Expected Discharge Date:  10/27/15               Expected Discharge Plan:  Home/Self Care  In-House Referral:  NA  Discharge planning Services  CM Consult  Post Acute Care Choice:  NA Choice offered to:  NA  DME Arranged:  N/A DME Agency:  NA  HH Arranged:  NA HH Agency:  NA  Status of Service:  Completed, signed off  Medicare Important Message Given:    Date Medicare IM Given:    Medicare IM give by:    Date Additional Medicare IM Given:    Additional Medicare Important Message give by:     If discussed at [place] of Stay Meetings, dates discussed:    Additional Comments:  Erenest Rasher, RN 10/24/2015, 5:09 PM

## 2015-10-24 NOTE — Care Management Obs Status (Signed)
[facility] NOTIFICATION   Patient Details  Name: [name] MRN: [id number] Date of Birth: 1934-01-05   Medicare Observation Status Notification Given:  Yes  MOON/CC44 given to and signed by pt; original to [place] for scanning into medical records.   Erenest Rasher, RN 10/24/2015, 4:04 PM

## 2015-10-24 NOTE — Progress Notes (Signed)
   10/24/15 0129  Vitals  Temp 98.3 F (36.8 C)  Temp Source Oral  BP 120/69 mmHg  BP Location Left Arm  BP Method Automatic  Patient Position (if appropriate) Lying  Pulse Rate 93  Pulse Rate Source Dinamap  Resp 18  Oxygen Therapy  SpO2 96 %  O2 Device Nasal Cannula  O2 Flow Rate (L/min) 2 L/min  Height and Weight  Weight 107.321 kg (236 lb 9.6 oz) (bed scale; pt very sob )  Type of Scale Used Bed  Type of Weight Actual  Admitted pt to rm 3E09 from ED, pt alert and oriented, denied pain at this time, oriented to room, call bell placed within reach, orders carried out. Will continue to monitor.

## 2015-10-24 NOTE — Progress Notes (Signed)
[facility] PROGRESS NOTE  [name] PZW:[id number] DOB: [birth year] DOA: 10/23/2015 PCP: Evelina Dun, FNP  Assessment/Plan: 82 y/o female with PMH of Hypothyroidism, HPL, Cerebellar degeneration, CVA presented with SOB, cough, DOE  SOB, Cough, congestion. URI. Patient also reports non resolving cough for few weeks. Probable asthmatic bronchitis. CXR: no clear infiltrates. Influenza->neg. Patient is still coughing. Cont supportive care, bronchodilators, add azithromycin, prednisone. Recommended outpatient PFTs  -DOE. Will obtain echo   Hypothyroidism. Cont levothyroxine.   Cerebellar degeneration. No acute symptoms. Cont outpatient follow up with neurology    Code Status: full Family Communication: d/w patient (indicate person spoken with, relationship, and if by phone, the number) Disposition Plan: home 24-48 hrs    Consultants:  none  Procedures:  Pend echo   Antibiotics:  none (indicate start date, and stop date if known)  HPI/Subjective: Alert, coughs   Objective: Filed Vitals:   10/24/15 0426 10/24/15 0850  BP: 118/61 137/59  Pulse: 87 80  Temp: 98.9 F (37.2 C) 97.9 F (36.6 C)  Resp: 17 18    Intake/Output Summary (Last 24 hours) at 10/24/15 0918 Last data filed at 10/24/15 0849  Gross per 24 hour  Intake 614.17 ml  Output      0 ml  Net 614.17 ml   Filed Weights   10/23/15 2044 10/24/15 0129  Weight: 99.791 kg (220 lb) 107.321 kg (236 lb 9.6 oz)    Exam:   General:  Coughs   Cardiovascular: s1,s2 rrr  Respiratory: few wheezing LL  Abdomen: soft, nt,nd   Musculoskeletal: no edema    Data Reviewed: Basic Metabolic Panel:  Recent Labs Lab 10/17/15 1512 10/23/15 2112 10/24/15 0613  NA 142 139 137  K 5.1 3.6 3.7  CL 101 106 103  CO2 24 21* 21*  GLUCOSE 99 140* 175*  BUN '12 7 9  '$ CREATININE 0.80 0.82 0.87  CALCIUM 9.6 9.3 9.3   Liver Function Tests: No results for input(s): AST, ALT, ALKPHOS, BILITOT, PROT, ALBUMIN  in the last 168 hours. No results for input(s): LIPASE, AMYLASE in the last 168 hours. No results for input(s): AMMONIA in the last 168 hours. CBC:  Recent Labs Lab 10/23/15 2112 10/24/15 0613  WBC 9.1 9.0  NEUTROABS 6.0  --   HGB 12.5 12.2  HCT 39.2 37.5  MCV 90.5 90.4  PLT 200 200   Cardiac Enzymes: No results for input(s): CKTOTAL, CKMB, CKMBINDEX, TROPONINI in the last 168 hours. BNP (last 3 results)  Recent Labs  11/24/14 1538 10/23/15 2112  BNP 56.1 90.7    ProBNP (last 3 results) No results for input(s): PROBNP in the last 8760 hours.  CBG: No results for input(s): GLUCAP in the last 168 hours.  No results found for this or any previous visit (from the past 240 hour(s)).   Studies: Dg Chest 2 View  10/23/2015  CLINICAL DATA:  Acute onset of cough, fever, sore throat and shortness of breath. Initial encounter. EXAM: CHEST  2 VIEW COMPARISON:  Chest radiograph performed 04/14/2015, and CT of the chest performed 05/10/2015 FINDINGS: There is elevation of the right hemidiaphragm. Mild bibasilar atelectasis is noted. Mild vascular congestion is seen. There is no evidence of pleural effusion or pneumothorax. The heart is mildly enlarged. A loop recorder is noted. No acute osseous abnormalities are seen. Scattered calcification is noted along the proximal abdominal aorta. IMPRESSION: Elevation of the right hemidiaphragm. Mild bibasilar atelectasis. Mild vascular congestion and mild cardiomegaly. Electronically Signed   By: Garald Balding  M.D.   On: 10/23/2015 21:48    Scheduled Meds: . amitriptyline  25 mg Oral QHS  . antiseptic oral rinse  7 mL Mouth Rinse BID  . enoxaparin (LOVENOX) injection  50 mg Subcutaneous Q24H  . escitalopram  20 mg Oral QHS  . gabapentin  300 mg Oral TID  . ipratropium-albuterol  3 mL Nebulization Q4H  . levothyroxine  75 mcg Oral QAC breakfast  . losartan  50 mg Oral QHS  . methylPREDNISolone (SOLU-MEDROL) injection  125 mg Intravenous Once   . pantoprazole  40 mg Oral Daily  . pravastatin  40 mg Oral q1800  . sodium chloride flush  3 mL Intravenous Q12H   Continuous Infusions: . sodium chloride 50 mL/hr at 10/24/15 1464    Active Problems:   Hyperlipemia   Essential hypertension   Seizure disorder ([name])   Paroxysmal atrial fibrillation ([name])   SOB (shortness of breath)   Acute respiratory failure with hypoxia ([name])   Asthmatic bronchitis   Viral URI with cough    Time spent: >35 minutes     Kinnie Feil  [facility] Pager [phone]. If 7PM-7AM, please contact night-coverage at [email or web address], password [facility] 10/24/2015, 9:18 AM  LOS: 1 day

## 2015-10-25 DIAGNOSIS — R0602 Shortness of breath: Secondary | ICD-10-CM | POA: Diagnosis not present

## 2015-10-25 DIAGNOSIS — J069 Acute upper respiratory infection, unspecified: Secondary | ICD-10-CM | POA: Diagnosis not present

## 2015-10-25 DIAGNOSIS — J4551 Severe persistent asthma with (acute) exacerbation: Secondary | ICD-10-CM | POA: Diagnosis not present

## 2015-10-25 DIAGNOSIS — I10 Essential (primary) hypertension: Secondary | ICD-10-CM | POA: Diagnosis not present

## 2015-10-25 MED ORDER — AZITHROMYCIN 250 MG PO TABS: ORAL_TABLET | ORAL | Status: DC

## 2015-10-25 MED ORDER — HYDROCOD POLST-CPM POLST ER 10-8 MG/5ML PO SUER: 5.0000 mL | Freq: Two times a day (BID) | ORAL | Status: DC | PRN

## 2015-10-25 MED ORDER — PREDNISONE 20 MG PO TABS: 20.0000 mg | ORAL_TABLET | Freq: Every day | ORAL | Status: DC

## 2015-10-25 MED ORDER — ALBUTEROL SULFATE HFA 108 (90 BASE) MCG/ACT IN AERS: 2.0000 | INHALATION_SPRAY | Freq: Four times a day (QID) | RESPIRATORY_TRACT | Status: DC | PRN

## 2015-10-25 NOTE — Discharge Summary (Signed)
Physician Discharge Summary  [name] OXB:[id number] DOB: 10-Jan-1934 DOA: 10/23/2015  PCP: Evelina Dun, FNP  Admit date: 10/23/2015 Discharge date: 10/25/2015  Time spent: >35 minutes  Recommendations for Outpatient Follow-up:  F/u with PCP in 3-7 days Pulmonology in 3-4 weeks   Discharge Diagnoses:  Active Problems:   Hyperlipemia   Essential hypertension   Seizure disorder ([name])   Paroxysmal atrial fibrillation ([name])   SOB (shortness of breath)   Acute respiratory failure with hypoxia ([name])   Asthmatic bronchitis   Viral URI with cough   Discharge Condition: stable   Diet recommendation: low sodium   Filed Weights   10/23/15 2044 10/24/15 0129 10/25/15 0638  Weight: 99.791 kg (220 lb) 107.321 kg (236 lb 9.6 oz) 104.327 kg (230 lb)    History of present illness:  82 y/o female with PMH of Hypothyroidism, HPL, Cerebellar degeneration, CVA presented with SOB, cough, DOE.  -"She has had subjective fevers and chills cough productive of whitish sputum and body aches. She was so SOB she called EMS and was brought to the ED , and on route she was given IV solumedrol and Nebulizer Treatments. She was more Neb treatments in the ED, and IV Decadron but continued to have Wheezing and increased Coughing. A chest X-ray revealed Bilateral Atelectasis, and Mild Cardiomegaly, but no signs of pneumonia.'   Hospital Course:   SOB, Cough, congestion. URI. Patient also reports non resolving cough for few weeks. Probable asthmatic bronchitis. CXR: no clear infiltrates. Influenza->neg.  -Pt is improved on supportive care, bronchodilators, add azithromycin, prednisone. Recommended outpatient PFTs. Also obtain echocardiogram for DOE, which showed: The estimated ejection fraction was in the range of 55%  to 60%. Wall motion was normal; there were no regional wall motion abnormalities.   Hypothyroidism. Cont levothyroxine.   Cerebellar degeneration. No acute symptoms. Cont outpatient  follow up with neurology    Procedures: Echo: Study Conclusions  - Procedure narrative: Transthoracic echocardiography. Image  quality was adequate. The study was technically difficult. - Left ventricle: The cavity size was normal. Systolic function was  normal. The estimated ejection fraction was in the range of 55%  to 60%. Wall motion was normal; there were no regional wall  motion abnormalities. Doppler parameters are consistent with  abnormal left ventricular relaxation (grade 1 diastolic  dysfunction).  - Atrial septum: No defect or patent foramen ovale was identified. (i.e. Studies not automatically included, echos, thoracentesis, etc; not x-rays)  Consultations: none  Discharge Exam: Filed Vitals:   10/25/15 0011 10/25/15 0638  BP: 142/73 161/61  Pulse: 96 69  Temp: 98.7 F (37.1 C) 97.7 F (36.5 C)  Resp: 18 18    General: alert, no distress  Cardiovascular: s1,s2 rrr Respiratory: no wheezing   Discharge Instructions  Discharge Instructions    Diet - low sodium heart healthy    Complete by:  As directed      Discharge instructions    Complete by:  As directed   Please follow up with primary care doctor in 3-7 days     Increase activity slowly    Complete by:  As directed             Medication List    STOP taking these medications        traZODone 150 MG tablet  Commonly known as:  DESYREL      TAKE these medications        albuterol 108 (90 Base) MCG/ACT inhaler  Commonly known as:  PROVENTIL  HFA;VENTOLIN HFA  Inhale 2 puffs into the lungs every 6 (six) hours as needed for wheezing or shortness of breath.     amitriptyline 50 MG tablet  Commonly known as:  ELAVIL  Take 0.5 tablets (25 mg total) by mouth at bedtime.     amLODipine 5 MG tablet  Commonly known as:  NORVASC  TAKE 1 TABLET DAILY     azithromycin 250 MG tablet  Commonly known as:  ZITHROMAX  Daily for 4 days     chlorpheniramine-HYDROcodone 10-8 MG/5ML Suer   Commonly known as:  TUSSIONEX  Take 5 mLs by mouth every 12 (twelve) hours as needed for cough.     escitalopram 20 MG tablet  Commonly known as:  LEXAPRO  Take 1 tablet (20 mg total) by mouth at bedtime.     fluticasone 50 MCG/ACT nasal spray  Commonly known as:  FLONASE  Place 2 sprays into both nostrils daily.     furosemide 40 MG tablet  Commonly known as:  LASIX  Take 1 tablet (40 mg total) by mouth daily as needed for fluid.     gabapentin 300 MG capsule  Commonly known as:  NEURONTIN  Take 1 capsule (300 mg total) by mouth 3 (three) times daily.     levothyroxine 75 MCG tablet  Commonly known as:  SYNTHROID, LEVOTHROID  Take 1 tablet (75 mcg total) by mouth daily before breakfast.     losartan 50 MG tablet  Commonly known as:  COZAAR  Take 1 tablet (50 mg total) by mouth daily.     lovastatin 40 MG tablet  Commonly known as:  MEVACOR  Take 1 tablet (40 mg total) by mouth at bedtime.     omeprazole 40 MG capsule  Commonly known as:  PRILOSEC  Take 1 capsule (40 mg total) by mouth daily.     predniSONE 20 MG tablet  Commonly known as:  DELTASONE  Take 1 tablet (20 mg total) by mouth daily with breakfast.     vitamin B-12 1000 MCG tablet  Commonly known as:  CYANOCOBALAMIN  Take 1,000 mcg by mouth every 14 (fourteen) days.       Allergies  Allergen Reactions  . Lisinopril Cough      The results of significant diagnostics from this hospitalization (including imaging, microbiology, ancillary and laboratory) are listed below for reference.    Significant Diagnostic Studies: Dg Chest 2 View  10/23/2015  CLINICAL DATA:  Acute onset of cough, fever, sore throat and shortness of breath. Initial encounter. EXAM: CHEST  2 VIEW COMPARISON:  Chest radiograph performed 04/14/2015, and CT of the chest performed 05/10/2015 FINDINGS: There is elevation of the right hemidiaphragm. Mild bibasilar atelectasis is noted. Mild vascular congestion is seen. There is no evidence  of pleural effusion or pneumothorax. The heart is mildly enlarged. A loop recorder is noted. No acute osseous abnormalities are seen. Scattered calcification is noted along the proximal abdominal aorta. IMPRESSION: Elevation of the right hemidiaphragm. Mild bibasilar atelectasis. Mild vascular congestion and mild cardiomegaly. Electronically Signed   By: Garald Balding M.D.   On: 10/23/2015 21:48    Microbiology: No results found for this or any previous visit (from the past 240 hour(s)).   Labs: Basic Metabolic Panel:  Recent Labs Lab 10/23/15 2112 10/24/15 0613  NA 139 137  K 3.6 3.7  CL 106 103  CO2 21* 21*  GLUCOSE 140* 175*  BUN 7 9  CREATININE 0.82 0.87  CALCIUM 9.3 9.3  Liver Function Tests: No results for input(s): AST, ALT, ALKPHOS, BILITOT, PROT, ALBUMIN in the last 168 hours. No results for input(s): LIPASE, AMYLASE in the last 168 hours. No results for input(s): AMMONIA in the last 168 hours. CBC:  Recent Labs Lab 10/23/15 2112 10/24/15 0613  WBC 9.1 9.0  NEUTROABS 6.0  --   HGB 12.5 12.2  HCT 39.2 37.5  MCV 90.5 90.4  PLT 200 200   Cardiac Enzymes: No results for input(s): CKTOTAL, CKMB, CKMBINDEX, TROPONINI in the last 168 hours. BNP: BNP (last 3 results)  Recent Labs  11/24/14 1538 10/23/15 2112  BNP 56.1 90.7    ProBNP (last 3 results) No results for input(s): PROBNP in the last 8760 hours.  CBG: No results for input(s): GLUCAP in the last 168 hours.     SignedKinnie Feil  [facility] 10/25/2015, 8:05 AM

## 2015-10-25 NOTE — Progress Notes (Signed)
Pt has orders to be discharged. Discharge instructions given and pt has no additional questions at this time. Medication regimen reviewed and pt educated. Pt verbalized understanding and has no additional questions. Telemetry box removed. IV removed and site in good condition. Pt stable and waiting for transportation. 

## 2015-10-26 ENCOUNTER — Telehealth: Payer: Self-pay | Admitting: *Deleted

## 2015-11-01 ENCOUNTER — Encounter: Payer: Self-pay | Admitting: Family

## 2015-11-01 ENCOUNTER — Ambulatory Visit (INDEPENDENT_AMBULATORY_CARE_PROVIDER_SITE_OTHER): Payer: Medicare Other | Admitting: Family

## 2015-11-01 VITALS — BP 130/77 | HR 74 | Temp 97.1°F | Ht 63.0 in

## 2015-11-01 DIAGNOSIS — R0602 Shortness of breath: Secondary | ICD-10-CM

## 2015-11-01 DIAGNOSIS — J209 Acute bronchitis, unspecified: Secondary | ICD-10-CM | POA: Diagnosis not present

## 2015-11-01 DIAGNOSIS — I639 Cerebral infarction, unspecified: Secondary | ICD-10-CM

## 2015-11-01 DIAGNOSIS — Z09 Encounter for follow-up examination after completed treatment for conditions other than malignant neoplasm: Secondary | ICD-10-CM

## 2015-11-01 NOTE — Patient Instructions (Signed)
Acute Bronchitis Bronchitis is inflammation of the airways that extend from the windpipe into the lungs (bronchi). The inflammation often causes mucus to develop. This leads to a cough, which is the most common symptom of bronchitis.  In acute bronchitis, the condition usually develops suddenly and goes away over time, usually in a couple weeks. Smoking, allergies, and asthma can make bronchitis worse. Repeated episodes of bronchitis may cause further lung problems.  CAUSES Acute bronchitis is most often caused by the same virus that causes a cold. The virus can spread from person to person (contagious) through coughing, sneezing, and touching contaminated objects. SIGNS AND SYMPTOMS   Cough.   Fever.   Coughing up mucus.   Body aches.   Chest congestion.   Chills.   Shortness of breath.   Sore throat.  DIAGNOSIS  Acute bronchitis is usually diagnosed through a physical exam. Your health care provider will also ask you questions about your medical history. Tests, such as chest X-rays, are sometimes done to rule out other conditions.  TREATMENT  Acute bronchitis usually goes away in a couple weeks. Oftentimes, no medical treatment is necessary. Medicines are sometimes given for relief of fever or cough. Antibiotic medicines are usually not needed but may be prescribed in certain situations. In some cases, an inhaler may be recommended to help reduce shortness of breath and control the cough. A cool mist vaporizer may also be used to help thin bronchial secretions and make it easier to clear the chest.  HOME CARE INSTRUCTIONS  Get plenty of rest.   Drink enough fluids to keep your urine clear or pale yellow (unless you have a medical condition that requires fluid restriction). Increasing fluids may help thin your respiratory secretions (sputum) and reduce chest congestion, and it will prevent dehydration.   Take medicines only as directed by your health care provider.  If  you were prescribed an antibiotic medicine, finish it all even if you start to feel better.  Avoid smoking and secondhand smoke. Exposure to cigarette smoke or irritating chemicals will make bronchitis worse. If you are a smoker, consider using nicotine gum or skin patches to help control withdrawal symptoms. Quitting smoking will help your lungs heal faster.   Reduce the chances of another bout of acute bronchitis by washing your hands frequently, avoiding people with cold symptoms, and trying not to touch your hands to your mouth, nose, or eyes.   Keep all follow-up visits as directed by your health care provider.  SEEK MEDICAL CARE IF: Your symptoms do not improve after 1 week of treatment.  SEEK IMMEDIATE MEDICAL CARE IF:  You develop an increased fever or chills.   You have chest pain.   You have severe shortness of breath.  You have bloody sputum.   You develop dehydration.  You faint or repeatedly feel like you are going to pass out.  You develop repeated vomiting.  You develop a severe headache. MAKE SURE YOU:   Understand these instructions.  Will watch your condition.  Will get help right away if you are not doing well or get worse.   This information is not intended to replace advice given to you by your health care provider. Make sure you discuss any questions you have with your health care provider.   Document Released: 08/23/2004 Document Revised: 08/06/2014 Document Reviewed: 01/06/2013 Elsevier Interactive Patient Education 2016 [place] meds as prescribed - Use a cool mist humidifier  -Use saline nose sprays frequently -Saline  irrigations of the nose can be very helpful if done frequently.  * 4X daily for 1 week*  * Use of a nettie pot can be helpful with this. Follow directions with this* -Force fluids -For any cough or congestion  Use plain Mucinex- regular strength or max strength is fine   * Children- consult with Pharmacist for  dosing -For fever or aces or pains- take tylenol or ibuprofen appropriate for age and weight.  * for fevers greater than 101 orally you may alternate ibuprofen and tylenol every  3 hours. -Throat lozenges if help -New toothbrush in 3 days   Rebekah Dun, FNP

## 2015-11-01 NOTE — Progress Notes (Signed)
   Subjective:    Patient ID: [name], female    DOB: 1934-03-11, 82 y.o.   MRN: [id number]  HPI Pt presents to the office today for hospital follow up for SOB. Pt went to the ED on on 10/23/15 and discharged on 10/25/15. Pt was given IV steroids, nebulizer, azithromycin with improvement. A chest X-ray revealed Bilateral Atelectasis, and Mild Cardiomegaly, but no signs of pneumonia. Pt reports feeling better, but continues to feel weak and has an intermittent nonproductive cough. Pt continues with her Tussionex with mild relief.   [facility] notes reviewed.   Review of Systems  Constitutional: Negative.   HENT: Negative.   Eyes: Negative.   Respiratory: Positive for cough, shortness of breath and wheezing.   Cardiovascular: Negative.  Negative for palpitations.  Gastrointestinal: Negative.   Endocrine: Negative.   Genitourinary: Negative.   Hematological: Negative.   Psychiatric/Behavioral: Negative.   All other systems reviewed and are negative.      Objective:   Physical Exam  Constitutional: She is oriented to person, place, and time. She appears well-developed and well-nourished. No distress.  Eyes: Pupils are equal, round, and reactive to light.  Neck: Normal range of motion. Neck supple. No thyromegaly present.  Cardiovascular: Normal rate, regular rhythm, normal heart sounds and intact distal pulses.   No murmur heard. Pulmonary/Chest: Effort normal and breath sounds normal. No respiratory distress. She has no wheezes.  Abdominal: Soft. Bowel sounds are normal. She exhibits no distension. There is no tenderness.  Musculoskeletal: Normal range of motion. She exhibits no edema or tenderness.  Neurological: She is alert and oriented to person, place, and time.  Pt in wheelchair   Skin: Skin is warm and dry.  Psychiatric: She has a normal mood and affect. Her behavior is normal. Judgment and thought content normal.  Vitals reviewed.    BP 130/77 mmHg  Pulse 74   Temp(Src) 97.1 F (36.2 C) (Oral)  Ht '5\' 3"'$  (1.6 m)  Wt       Assessment & Plan:  1. Acute bronchitis, unspecified organism  2. SOB (shortness of breath)  3. Hospital discharge follow-up   PT to continue flonase and albuterol as needed Pt to start mucinex BID Rest Force fluids RTO prn or if symptoms worsen  Evelina Dun, FNP

## 2015-11-01 NOTE — Telephone Encounter (Signed)
Entered in error

## 2015-11-02 ENCOUNTER — Ambulatory Visit (INDEPENDENT_AMBULATORY_CARE_PROVIDER_SITE_OTHER): Payer: Medicare Other | Admitting: *Deleted

## 2015-11-02 DIAGNOSIS — R002 Palpitations: Secondary | ICD-10-CM

## 2015-11-03 DIAGNOSIS — R69 Illness, unspecified: Secondary | ICD-10-CM | POA: Diagnosis not present

## 2015-11-03 NOTE — Progress Notes (Signed)
Carelink Summary Report / Loop Recorder 

## 2015-11-16 DIAGNOSIS — G44219 Episodic tension-type headache, not intractable: Secondary | ICD-10-CM | POA: Diagnosis not present

## 2015-12-01 ENCOUNTER — Ambulatory Visit: Payer: Medicare Other | Admitting: Family Medicine

## 2015-12-02 ENCOUNTER — Encounter: Payer: Self-pay | Admitting: Family

## 2015-12-02 ENCOUNTER — Ambulatory Visit (INDEPENDENT_AMBULATORY_CARE_PROVIDER_SITE_OTHER): Payer: Medicare Other | Admitting: *Deleted

## 2015-12-02 DIAGNOSIS — R002 Palpitations: Secondary | ICD-10-CM

## 2015-12-02 NOTE — Progress Notes (Signed)
Carelink Summary Report / Loop Recorder 

## 2015-12-24 LAB — CUP PACEART REMOTE DEVICE CHECK: Date Time Interrogation Session: 20170405140533

## 2015-12-24 NOTE — Progress Notes (Signed)
Carelink summary report received. Battery status OK. Normal device function. No new symptom episodes, tachy episodes, brady, or pause episodes. No new AF episodes. Monthly summary reports and ROV/PRN 

## 2015-12-30 ENCOUNTER — Telehealth: Payer: Self-pay | Admitting: Cardiology

## 2015-12-30 NOTE — Telephone Encounter (Signed)
Spoke w/ pt and requested that she send a manual transmission b/c her home monitor has not updated in at least 14 days.   

## 2016-01-01 LAB — CUP PACEART REMOTE DEVICE CHECK: Date Time Interrogation Session: 20170604143703

## 2016-01-02 ENCOUNTER — Ambulatory Visit (INDEPENDENT_AMBULATORY_CARE_PROVIDER_SITE_OTHER): Payer: Medicare Other | Admitting: *Deleted

## 2016-01-02 DIAGNOSIS — R002 Palpitations: Secondary | ICD-10-CM

## 2016-01-02 NOTE — Progress Notes (Signed)
Carelink Summary Report / Loop Recorder 

## 2016-01-09 LAB — CUP PACEART REMOTE DEVICE CHECK: Date Time Interrogation Session: 20170505143517

## 2016-01-09 NOTE — Progress Notes (Signed)
Carelink summary report received. Battery status OK. Normal device function. No new symptom episodes, tachy episodes, brady, or pause episodes. No new AF episodes. Monthly summary reports and ROV/PRN 

## 2016-01-27 ENCOUNTER — Telehealth: Payer: Self-pay | Admitting: Cardiology

## 2016-01-27 NOTE — Telephone Encounter (Signed)
Spoke w/ pt daughter about disconnected monitor. She will have pt send a remote transmission later today.

## 2016-02-01 ENCOUNTER — Ambulatory Visit (INDEPENDENT_AMBULATORY_CARE_PROVIDER_SITE_OTHER): Payer: Medicare Other | Admitting: *Deleted

## 2016-02-01 DIAGNOSIS — R002 Palpitations: Secondary | ICD-10-CM

## 2016-02-01 NOTE — Progress Notes (Signed)
Carelink Summary Report / Loop Recorder 

## 2016-02-14 ENCOUNTER — Ambulatory Visit (INDEPENDENT_AMBULATORY_CARE_PROVIDER_SITE_OTHER): Payer: Medicare Other | Admitting: Family Medicine

## 2016-02-14 ENCOUNTER — Encounter: Payer: Self-pay | Admitting: Family Medicine

## 2016-02-14 VITALS — BP 137/76 | HR 75 | Temp 97.1°F

## 2016-02-14 DIAGNOSIS — N390 Urinary tract infection, site not specified: Secondary | ICD-10-CM | POA: Diagnosis not present

## 2016-02-14 DIAGNOSIS — R300 Dysuria: Secondary | ICD-10-CM | POA: Diagnosis not present

## 2016-02-14 DIAGNOSIS — Z8673 Personal history of transient ischemic attack (TIA), and cerebral infarction without residual deficits: Secondary | ICD-10-CM | POA: Diagnosis not present

## 2016-02-14 LAB — URINALYSIS
BILIRUBIN UA: NEGATIVE
Glucose, UA: NEGATIVE
Ketones, UA: NEGATIVE
NITRITE UA: POSITIVE — AB
PH UA: 6.5 (ref 5.0–7.5)
Protein, UA: NEGATIVE
SPEC GRAV UA: 1.01 (ref 1.005–1.030)
UUROB: 0.2 mg/dL (ref 0.2–1.0)

## 2016-02-14 MED ORDER — CEFDINIR 300 MG PO CAPS: 300.0000 mg | ORAL_CAPSULE | Freq: Two times a day (BID) | ORAL | Status: DC

## 2016-02-14 MED ORDER — BENZONATATE 100 MG PO CAPS: 100.0000 mg | ORAL_CAPSULE | Freq: Two times a day (BID) | ORAL | Status: DC | PRN

## 2016-02-14 NOTE — Progress Notes (Signed)
   HPI  Patient presents today here with concern for UTI.  Patient explains that she's had approximate 5 days of symptoms including lower abdominal discomfort, dysuria, increased urination frequency.   She also complains of mild cough, request cough medication, she states Tessalon helps quite a bit.  Has any dyspnea, fevers, chills, sweats, or difficulty tolerating food or fluids.  PMH: Smoking status noted ROS: Per HPI  Objective: BP 137/76 mmHg  Pulse 75  Temp(Src) 97.1 F (36.2 C) (Oral)  Ht   Wt  Gen: NAD, alert, cooperative with exam HEENT: NCAT CV: RRR, good S1/S2, no murmur Resp: CTABL, no wheezes, non-labored Abdomen: Nontender to palpation, no CVA tenderness Ext: No edema, warm Neuro: Alert and oriented, No gross deficits  Assessment and plan:  # UTI Treat with Omnicef, with cough extended the course to 10 days instead of 7 which would cover pneumonia. Tessalon Perles for cough given. Urine culture sent Return to clinic with any concerns, worsening symptoms, or failure improves expected.     Orders Placed This Encounter  Procedures  . Urinalysis, Complete    Meds ordered this encounter  Medications  . DISCONTD: cefdinir (OMNICEF) 300 MG capsule    Sig: Take 1 capsule (300 mg total) by mouth 2 (two) times daily. 1 po BID    Dispense:  14 capsule    Refill:  0  . benzonatate (TESSALON) 100 MG capsule    Sig: Take 1 capsule (100 mg total) by mouth 2 (two) times daily as needed for cough.    Dispense:  20 capsule    Refill:  0  . cefdinir (OMNICEF) 300 MG capsule    Sig: Take 1 capsule (300 mg total) by mouth 2 (two) times daily. 1 po BID    Dispense:  20 capsule    Refill:  0    Please extend course to 10 days    Laroy Apple, MD [facility] 02/14/2016, 5:28 PM

## 2016-02-14 NOTE — Patient Instructions (Addendum)
Great to meet you!  I have sent omnicef to your pharmacy for treatment  Urinary Tract Infection Urinary tract infections (UTIs) can develop anywhere along your urinary tract. Your urinary tract is your body's drainage system for removing wastes and extra water. Your urinary tract includes two kidneys, two ureters, a bladder, and a urethra. Your kidneys are a pair of bean-shaped organs. Each kidney is about the size of your fist. They are located below your ribs, one on each side of your spine. CAUSES Infections are caused by microbes, which are microscopic organisms, including fungi, viruses, and bacteria. These organisms are so small that they can only be seen through a microscope. Bacteria are the microbes that most commonly cause UTIs. SYMPTOMS  Symptoms of UTIs may vary by age and gender of the patient and by the location of the infection. Symptoms in young women typically include a frequent and intense urge to urinate and a painful, burning feeling in the bladder or urethra during urination. Older women and men are more likely to be tired, shaky, and weak and have muscle aches and abdominal pain. A fever may mean the infection is in your kidneys. Other symptoms of a kidney infection include pain in your back or sides below the ribs, nausea, and vomiting. DIAGNOSIS To diagnose a UTI, your caregiver will ask you about your symptoms. Your caregiver will also ask you to provide a urine sample. The urine sample will be tested for bacteria and white blood cells. White blood cells are made by your body to help fight infection. TREATMENT  Typically, UTIs can be treated with medication. Because most UTIs are caused by a bacterial infection, they usually can be treated with the use of antibiotics. The choice of antibiotic and length of treatment depend on your symptoms and the type of bacteria causing your infection. HOME CARE INSTRUCTIONS  If you were prescribed antibiotics, take them exactly as your  caregiver instructs you. Finish the medication even if you feel better after you have only taken some of the medication.  Drink enough water and fluids to keep your urine clear or pale yellow.  Avoid caffeine, tea, and carbonated beverages. They tend to irritate your bladder.  Empty your bladder often. Avoid holding urine for long periods of time.  Empty your bladder before and after sexual intercourse.  After a bowel movement, women should cleanse from front to back. Use each tissue only once. SEEK MEDICAL CARE IF:   You have back pain.  You develop a fever.  Your symptoms do not begin to resolve within 3 days. SEEK IMMEDIATE MEDICAL CARE IF:   You have severe back pain or lower abdominal pain.  You develop chills.  You have nausea or vomiting.  You have continued burning or discomfort with urination. MAKE SURE YOU:   Understand these instructions.  Will watch your condition.  Will get help right away if you are not doing well or get worse.   This information is not intended to replace advice given to you by your health care provider. Make sure you discuss any questions you have with your health care provider.   Document Released: 04/25/2005 Document Revised: 04/06/2015 Document Reviewed: 08/24/2011 Elsevier Interactive Patient Education [employer].

## 2016-02-17 ENCOUNTER — Encounter: Payer: Self-pay | Admitting: Family

## 2016-02-17 ENCOUNTER — Ambulatory Visit (INDEPENDENT_AMBULATORY_CARE_PROVIDER_SITE_OTHER): Payer: Medicare Other | Admitting: Family

## 2016-02-17 VITALS — BP 126/79 | HR 74 | Temp 96.9°F

## 2016-02-17 DIAGNOSIS — J309 Allergic rhinitis, unspecified: Secondary | ICD-10-CM | POA: Diagnosis not present

## 2016-02-17 DIAGNOSIS — R39198 Other difficulties with micturition: Secondary | ICD-10-CM

## 2016-02-17 DIAGNOSIS — I639 Cerebral infarction, unspecified: Secondary | ICD-10-CM | POA: Diagnosis not present

## 2016-02-17 DIAGNOSIS — N3000 Acute cystitis without hematuria: Secondary | ICD-10-CM | POA: Diagnosis not present

## 2016-02-17 LAB — URINALYSIS
Bilirubin, UA: NEGATIVE
Glucose, UA: NEGATIVE
KETONES UA: NEGATIVE
Leukocytes, UA: NEGATIVE
NITRITE UA: NEGATIVE
PH UA: 6.5 (ref 5.0–7.5)
Protein, UA: NEGATIVE
RBC, UA: NEGATIVE
Specific Gravity, UA: 1.01 (ref 1.005–1.030)
UUROB: 0.2 mg/dL (ref 0.2–1.0)

## 2016-02-17 LAB — CUP PACEART REMOTE DEVICE CHECK: MDC IDC SESS DTM: 20170704150540

## 2016-02-17 LAB — URINE CULTURE

## 2016-02-17 MED ORDER — ALBUTEROL SULFATE HFA 108 (90 BASE) MCG/ACT IN AERS: 2.0000 | INHALATION_SPRAY | Freq: Four times a day (QID) | RESPIRATORY_TRACT | Status: DC | PRN

## 2016-02-17 MED ORDER — SULFAMETHOXAZOLE-TRIMETHOPRIM 800-160 MG PO TABS: 1.0000 | ORAL_TABLET | Freq: Two times a day (BID) | ORAL | Status: DC

## 2016-02-17 MED ORDER — FLUTICASONE PROPIONATE 50 MCG/ACT NA SUSP: 2.0000 | Freq: Every day | NASAL | Status: DC

## 2016-02-17 NOTE — Progress Notes (Addendum)
   Subjective:    Patient ID: [name], female    DOB: February 17, 1934, 82 y.o.   MRN: [id number]  Pt presents to the office today for recurrent UTI symptoms. Pt was seen in clinic on 02/14/16 and started omnicef 300 mg BID for 10 days. Pt states this is her fourth day of the antibiotic and continues to have a lot of constant pressure 5 out 10. PT continues to have frequency, urgency,hesitancy, and incontinence at times.  Urinary Tract Infection  This is a recurrent problem. The current episode started 1 to 4 weeks ago. The problem occurs every urination. The problem has been waxing and waning. The pain is at a severity of 5/10. Associated symptoms include frequency, hesitancy and urgency. Pertinent negatives include no nausea or vomiting. She has tried antibiotics and increased fluids for the symptoms. The treatment provided mild relief.      Review of Systems  Respiratory: Negative.   Cardiovascular: Negative.   Gastrointestinal: Positive for diarrhea. Negative for nausea and vomiting.  Genitourinary: Positive for hesitancy, urgency and frequency.       Objective:   Physical Exam  Constitutional: She is oriented to person, place, and time. She appears well-developed and well-nourished. No distress.  Cardiovascular: Normal rate, regular rhythm, normal heart sounds and intact distal pulses.   No murmur heard. Pulmonary/Chest: Effort normal and breath sounds normal. No respiratory distress. She has no wheezes.  Abdominal: Soft. Bowel sounds are normal. She exhibits no distension. There is tenderness (Mild in lower abd).  Musculoskeletal: Normal range of motion. She exhibits no edema or tenderness.  Neurological: She is alert and oriented to person, place, and time.  Skin: Skin is warm and dry.  Psychiatric: She has a normal mood and affect. Her behavior is normal. Judgment and thought content normal.  Vitals reviewed.    BP 126/79 mmHg  Pulse 74  Temp(Src) 96.9 F (36.1 C) (Oral)   Wt       Assessment & Plan:  1. Voiding difficulty - Urinalysis - Urine culture  2. Allergic rhinitis, unspecified allergic rhinitis type - fluticasone (FLONASE) 50 MCG/ACT nasal spray; Place 2 sprays into both nostrils daily.  Dispense: 16 g; Refill: 6  3. Acute cystitis without hematuria -Force fluids AZO over the counter X2 days RTO prn - sulfamethoxazole-trimethoprim (BACTRIM DS) 800-160 MG tablet; Take 1 tablet by mouth 2 (two) times daily.  Dispense: 14 tablet; Refill: 0  Given pt's symptoms and per pt's request we will stop Omnicef and start bactrim   Evelina Dun, FNP

## 2016-02-17 NOTE — Patient Instructions (Signed)

## 2016-02-18 LAB — URINE CULTURE: ORGANISM ID, BACTERIA: NO GROWTH

## 2016-02-22 ENCOUNTER — Encounter: Payer: Self-pay | Admitting: Family

## 2016-02-22 ENCOUNTER — Ambulatory Visit (INDEPENDENT_AMBULATORY_CARE_PROVIDER_SITE_OTHER): Payer: Medicare Other | Admitting: Family

## 2016-02-22 VITALS — BP 124/46 | HR 80 | Temp 97.0°F

## 2016-02-22 DIAGNOSIS — R197 Diarrhea, unspecified: Secondary | ICD-10-CM

## 2016-02-22 DIAGNOSIS — Z8673 Personal history of transient ischemic attack (TIA), and cerebral infarction without residual deficits: Secondary | ICD-10-CM | POA: Diagnosis not present

## 2016-02-22 NOTE — Patient Instructions (Signed)

## 2016-02-22 NOTE — Progress Notes (Signed)
   Subjective:    Patient ID: [name], female    DOB: April 01, 1934, 82 y.o.   MRN: [id number]  Diarrhea   This is a new problem. The current episode started 1 to 4 weeks ago. The problem occurs 2 to 4 times per day. The problem has been waxing and waning. The stool consistency is described as watery. The patient states that diarrhea does not awaken her from sleep. Associated symptoms include abdominal pain. Pertinent negatives include no bloating, chills, coughing, headaches, increased  flatus or vomiting. Risk factors include recent antibiotic use and recent hospitalization. She has tried increased fluids for the symptoms. The treatment provided no relief.      Review of Systems  Constitutional: Negative for chills.  Respiratory: Negative for cough.   Gastrointestinal: Positive for abdominal pain and diarrhea. Negative for bloating, flatus and vomiting.  Neurological: Negative for headaches.  All other systems reviewed and are negative.      Objective:   Physical Exam  Constitutional: She is oriented to person, place, and time. She appears well-developed and well-nourished. No distress.  HENT:  Head: Normocephalic.  Cardiovascular: Normal rate, regular rhythm, normal heart sounds and intact distal pulses.   No murmur heard. Pulmonary/Chest: Effort normal and breath sounds normal. No respiratory distress. She has no wheezes.  Abdominal: Soft. Bowel sounds are normal. She exhibits no distension. There is no tenderness.  Musculoskeletal: She exhibits no edema or tenderness.  PT in wheelchair   Neurological: She is alert and oriented to person, place, and time.  Skin: Skin is warm and dry.  Psychiatric: She has a normal mood and affect. Her behavior is normal. Judgment and thought content normal.  Vitals reviewed.     BP (!) 141/77 (BP Location: Right Arm)   Pulse 81   Temp 97 F (36.1 C) (Oral)      Assessment & Plan:  1. Diarrhea, unspecified type -Force  fluids -Imodium OTC -Labs pending -Good hand hygiene RTO prn - CBC with Differential/Platelet - Cdiff NAA+O+P+Stool Culture  *PT has hx of c diff and has been on recent antibiotic.   Evelina Dun, FNP

## 2016-02-23 ENCOUNTER — Other Ambulatory Visit: Payer: Medicare Other

## 2016-02-23 DIAGNOSIS — R197 Diarrhea, unspecified: Secondary | ICD-10-CM | POA: Diagnosis not present

## 2016-02-23 LAB — CBC WITH DIFFERENTIAL/PLATELET
BASOS ABS: 0 10*3/uL (ref 0.0–0.2)
Basos: 0 %
EOS (ABSOLUTE): 0.1 10*3/uL (ref 0.0–0.4)
Eos: 2 %
Hematocrit: 40 % (ref 34.0–46.6)
Hemoglobin: 13.1 g/dL (ref 11.1–15.9)
IMMATURE GRANULOCYTES: 0 %
Immature Grans (Abs): 0 10*3/uL (ref 0.0–0.1)
LYMPHS: 26 %
Lymphocytes Absolute: 1.9 10*3/uL (ref 0.7–3.1)
MCH: 29.2 pg (ref 26.6–33.0)
MCHC: 32.8 g/dL (ref 31.5–35.7)
MCV: 89 fL (ref 79–97)
MONOS ABS: 0.7 10*3/uL (ref 0.1–0.9)
Monocytes: 9 %
NEUTROS PCT: 63 %
Neutrophils Absolute: 4.3 10*3/uL (ref 1.4–7.0)
PLATELETS: 247 10*3/uL (ref 150–379)
RBC: 4.49 x10E6/uL (ref 3.77–5.28)
RDW: 14.1 % (ref 12.3–15.4)
WBC: 7.1 10*3/uL (ref 3.4–10.8)

## 2016-02-28 ENCOUNTER — Telehealth: Payer: Self-pay

## 2016-02-28 LAB — CDIFF NAA+O+P+STOOL CULTURE
E COLI SHIGA TOXIN ASSAY: NEGATIVE
Toxigenic C. Difficile by PCR: NEGATIVE

## 2016-02-28 MED ORDER — SULFAMETHOXAZOLE-TRIMETHOPRIM 800-160 MG PO TABS: 1.0000 | ORAL_TABLET | Freq: Two times a day (BID) | ORAL | 0 refills | Status: DC

## 2016-02-28 NOTE — Telephone Encounter (Signed)
Bactrim Prescription sent to pharmacy for UTI symptoms

## 2016-02-28 NOTE — Telephone Encounter (Signed)
Patient is having dysuria and abdominal pressure that started this afternoon. Patient states that she has been having diarrhea for over 3 weeks. Can something be called in or does he need to be seen? Please advise

## 2016-02-28 NOTE — Telephone Encounter (Signed)
Pt aware rx sent to the pharmacy.

## 2016-02-28 NOTE — Addendum Note (Signed)
Addended by: Evelina Dun A on: 02/28/2016 05:37 PM   Modules accepted: Orders

## 2016-03-01 ENCOUNTER — Ambulatory Visit (INDEPENDENT_AMBULATORY_CARE_PROVIDER_SITE_OTHER): Payer: Medicare Other | Admitting: *Deleted

## 2016-03-01 ENCOUNTER — Encounter: Payer: Self-pay | Admitting: Pediatrics

## 2016-03-01 ENCOUNTER — Ambulatory Visit (INDEPENDENT_AMBULATORY_CARE_PROVIDER_SITE_OTHER): Payer: Medicare Other | Admitting: Pediatrics

## 2016-03-01 VITALS — BP 122/61 | HR 78 | Temp 97.0°F

## 2016-03-01 DIAGNOSIS — R339 Retention of urine, unspecified: Secondary | ICD-10-CM | POA: Diagnosis not present

## 2016-03-01 DIAGNOSIS — A09 Infectious gastroenteritis and colitis, unspecified: Secondary | ICD-10-CM

## 2016-03-01 DIAGNOSIS — Z8673 Personal history of transient ischemic attack (TIA), and cerebral infarction without residual deficits: Secondary | ICD-10-CM

## 2016-03-01 DIAGNOSIS — R002 Palpitations: Secondary | ICD-10-CM | POA: Diagnosis not present

## 2016-03-01 DIAGNOSIS — N390 Urinary tract infection, site not specified: Secondary | ICD-10-CM

## 2016-03-01 MED ORDER — CIPROFLOXACIN HCL 500 MG PO TABS: 500.0000 mg | ORAL_TABLET | Freq: Two times a day (BID) | ORAL | 0 refills | Status: DC

## 2016-03-01 NOTE — Progress Notes (Signed)
Subjective:    Patient ID: [name], female    DOB: 1934-06-12, 82 y.o.   MRN: [id number]  CC: Diarrhea   HPI: [name] is a 82 y.o. female presenting for Diarrhea  Continues to have watery diarrhea At least a couple of times a day having very watery stools Previously up to 4-5 times a day, now less frequent but more diarrhea when she goes.  is a relief when she goes, but more of it she thinks No abdominal pain now, occasionally has lower abd pains that are brief  Doesn't empty her bladder completely Wore a cath bag for a while a few years ago, then had it removed, never followed up Having frequent UTIs   Mood has been down Feeling more weak, uses walker at home, trouble getting up and using walker More tired all the time Happening for past 3 weeks since above symptoms started Diarrhea had been ongoing about a week before first started on cephlasporin for UTI, then switched to bactrim. Just had bactrim extended another week due to ongoing UTI symptoms 2 days ago UTI symptoms much improved now  Depression screen [facility] 2/9 03/01/2016 02/22/2016 02/17/2016 02/14/2016 09/30/2015  Decreased Interest 0 0 0 0 0  Down, Depressed, Hopeless 0 0 - 0 0  PHQ - 2 Score 0 0 0 0 0  Altered sleeping - - - - -  Tired, decreased energy - - - - -  Change in appetite - - - - -  Feeling bad or failure about yourself  - - - - -  Trouble concentrating - - - - -  Moving slowly or fidgety/restless - - - - -  Suicidal thoughts - - - - -  PHQ-9 Score - - - - -     Relevant past medical, surgical, family and social history reviewed and updated. Interim medical history since our last visit reviewed. Allergies and medications reviewed and updated.  History  Smoking Status  . Former Smoker  . Packs/day: 0.25  . Years: 10.00  . Types: Cigarettes  . Quit date: 07/31/1975  Smokeless Tobacco  . Never Used    ROS: Per HPI      Objective:    BP 122/61   Pulse 78   Temp 97 F (36.1 C)  (Oral)   Wt Readings from Last 3 Encounters:  10/25/15 230 lb (104.3 kg)  09/30/15 230 lb (104.3 kg)  06/27/15 227 lb (103 kg)    Gen: NAD, alert, cooperative with exam, NCAT, in a wheel chair EYES: EOMI, no scleral injection or icterus CV: NRRR, normal S1/S2, no murmur, distal pulses 2+ b/l Resp: CTABL, no wheezes, normal WOB Abd: +BS, soft, NTND. no guarding  Ext: No edema, warm Neuro: Alert and oriented    Assessment & Plan:    [name] was seen today for diarrhea, watery, ongoing for 3 weeks. Negative c diff, other stool studies. H/o c diff. On antibiotics for past 2 weeks due to UTI though symptoms ongoing a week prior to that. Will stop bactrim, give 3 days of cipro BID for infectious diarrhea given length of time symptoms ongoing. Re weakness, will check labs today. Continues to feel urinary retention, not emptying bladder completely, didn't follow up with urology, previously seeing [facility], Dr. Terance Paul.  Diagnoses and all orders for this visit:  Infectious diarrhea -     ciprofloxacin (CIPRO) 500 MG tablet; Take 1 tablet (500 mg total) by mouth 2 (two) times daily. -  CBC with Differential -     CMP14+EGFR -     Sedimentation rate  Urinary retention -     Ambulatory referral to Urology  Frequent UTI -     Ambulatory referral to Urology  Follow up plan: Return in about 3 months (around 06/01/2016).  Rebekah Found, MD [facility] 03/01/2016, 5:21 PM

## 2016-03-01 NOTE — Patient Instructions (Signed)
Stop bactrim  Start ciprofloxacin, take twice a day for 3 days  I'll put in the referral to urology

## 2016-03-02 LAB — CBC WITH DIFFERENTIAL/PLATELET
Basophils Absolute: 0 x10E3/uL (ref 0.0–0.2)
Basos: 0 %
EOS (ABSOLUTE): 0.1 x10E3/uL (ref 0.0–0.4)
Eos: 1 %
Hematocrit: 40.9 % (ref 34.0–46.6)
Hemoglobin: 13.1 g/dL (ref 11.1–15.9)
Immature Grans (Abs): 0 x10E3/uL (ref 0.0–0.1)
Immature Granulocytes: 0 %
Lymphocytes Absolute: 2.3 x10E3/uL (ref 0.7–3.1)
Lymphs: 26 %
MCH: 29.4 pg (ref 26.6–33.0)
MCHC: 32 g/dL (ref 31.5–35.7)
MCV: 92 fL (ref 79–97)
Monocytes Absolute: 0.8 x10E3/uL (ref 0.1–0.9)
Monocytes: 9 %
Neutrophils Absolute: 5.5 x10E3/uL (ref 1.4–7.0)
Neutrophils: 64 %
Platelets: 240 x10E3/uL (ref 150–379)
RBC: 4.45 x10E6/uL (ref 3.77–5.28)
RDW: 13.7 % (ref 12.3–15.4)
WBC: 8.9 x10E3/uL (ref 3.4–10.8)

## 2016-03-02 LAB — CMP14+EGFR
ALT: 10 IU/L (ref 0–32)
AST: 15 IU/L (ref 0–40)
Albumin/Globulin Ratio: 1.6 (ref 1.2–2.2)
Albumin: 4.4 g/dL (ref 3.5–4.8)
Alkaline Phosphatase: 74 IU/L (ref 39–117)
BUN/Creatinine Ratio: 10 — ABNORMAL LOW (ref 12–28)
BUN: 10 mg/dL (ref 8–27)
Bilirubin Total: 0.4 mg/dL (ref 0.0–1.2)
CO2: 23 mmol/L (ref 18–29)
Calcium: 9.8 mg/dL (ref 8.7–10.3)
Chloride: 93 mmol/L — ABNORMAL LOW (ref 96–106)
Creatinine, Ser: 1.01 mg/dL — ABNORMAL HIGH (ref 0.57–1.00)
GFR calc Af Amer: 61 mL/min/1.73
GFR calc non Af Amer: 53 mL/min/1.73 — ABNORMAL LOW
Globulin, Total: 2.7 g/dL (ref 1.5–4.5)
Glucose: 111 mg/dL — ABNORMAL HIGH (ref 65–99)
Potassium: 4.5 mmol/L (ref 3.5–5.2)
Sodium: 132 mmol/L — ABNORMAL LOW (ref 134–144)
Total Protein: 7.1 g/dL (ref 6.0–8.5)

## 2016-03-02 LAB — SEDIMENTATION RATE: Sed Rate: 8 mm/h (ref 0–40)

## 2016-03-02 NOTE — Progress Notes (Signed)
Carelink Summary Report / Loop Recorder 

## 2016-03-05 ENCOUNTER — Telehealth: Payer: Self-pay | Admitting: Internal Medicine

## 2016-03-05 NOTE — Telephone Encounter (Signed)
Patient reports 1 month of diarrhea.  She has been to her primary care and they did some stool tests that were all negative.  She will come in and see [name], [place] Wed 03/07/16 115

## 2016-03-07 ENCOUNTER — Ambulatory Visit (INDEPENDENT_AMBULATORY_CARE_PROVIDER_SITE_OTHER): Payer: Medicare Other | Admitting: Physician Assistant

## 2016-03-07 ENCOUNTER — Encounter: Payer: Self-pay | Admitting: Physician Assistant

## 2016-03-07 ENCOUNTER — Telehealth: Payer: Self-pay | Admitting: Cardiology

## 2016-03-07 VITALS — BP 108/60 | HR 80

## 2016-03-07 DIAGNOSIS — I639 Cerebral infarction, unspecified: Secondary | ICD-10-CM

## 2016-03-07 DIAGNOSIS — R197 Diarrhea, unspecified: Secondary | ICD-10-CM | POA: Diagnosis not present

## 2016-03-07 MED ORDER — DIPHENOXYLATE-ATROPINE 2.5-0.025 MG PO TABS: 1.0000 | ORAL_TABLET | Freq: Four times a day (QID) | ORAL | 1 refills | Status: AC | PRN

## 2016-03-07 NOTE — Patient Instructions (Signed)
We have sent the following medications to your pharmacy for you to pick up at your convenience:Lomotil.  Your physician has requested that you go to the basement for the following lab work before leaving today:GI pathogen panel.  We will call you to set up the Colonoscopy once Anderson Malta has discussed this with Dr. Carlean Purl.

## 2016-03-07 NOTE — Telephone Encounter (Signed)
Spoke w/ pt and requested that she send a manual transmission b/c her home monitor has not updated in at least 14 days.   

## 2016-03-07 NOTE — Progress Notes (Signed)
Chief Complaint: Diarrhea  HPI:  [name] is a 82 year old Caucasian female with multiple medical problems including CVA, reflux, seizure disorder and A. Fib,  who was referred to me by Sharion Balloon, FNP for a complaint of diarrhea.  The patient has followed with Dr. Carlean Purl in the past, most recently 03/03/14 for a change in color of stools and was placed on metronidazole 250 mg 3 times a day 10 days.   Independent review of report and images from patient's last colonoscopy completed on 03/23/2008 showed external hemorrhoids and otherwise normal exam. Repeat was recommended years. Patient also had an EGD in 05/07/12 which showed a tortuous esophagus, multiple sessile polyps in the gastric fundus and body and otherwise normal stomach and duodenal mucosa.  Recent labs completed 03/01/2016 revealing normal CBC and CMP as well as sedimentation rate. Recent stool studies completed 02/23/2016 revealed a negative stool culture, O&P 1 and C. Difficile.     Patient recently saw her primary care physician Dr. Evette Doffing and at that time complained of watery diarrhea she was given 3 days of ciprofloxacin 500 mg twice a day on 03/28/16.  Today, the patient presents to clinic accompanied by her husband. She tells me that she has been having watery diarrhea for 4-5 weeks now. The patient notes that at first she would have 4-5 loose stools per day which were unpredictable. Now within the past 2 weeks she typically has 2 loose bowel movements per day which come on with "no abdominal pain or signal" that they are coming. The patient has been incontinent on a daily basis. Patient describes the stool as watery. She did recently finish the ciprofloxacin prescribed by her primary care provider, but this seemed to make no difference. She has also been taking 4 Imodium pills per day and eating Activia which has not helped. The patient does tell me that she was on antibiotics for a urinary tract infection recently, but this  was after onset of diarrhea and likely secondary to this. Associated symptoms include a decreased appetite.  Patient's medical history is positive for a recent TIA at home 1-2 months ago, patient never went to the hospital or clinic for this, as she has these "quite frequently", this is thought due to the fact that she cannot be on anticoagulation and has A. fib. Patient also has a seizure disorder with multiple seizures per month. This typically only happens when she "forgets her medicine".  Patient denies fever, chills, blood in her stool, melena, recent travel, change in medications, change in diet, weight loss, nausea, vomiting, heartburn, reflux or abdominal pain.    Past Medical History:  Diagnosis Date  . Allergic rhinitis    PT. DENIES  . Anxiety   . Arthritis    NECK  . Ataxia   . Bradycardia    primarily nocturnal  . Burning tongue syndrome 25 years  . Cataract   . Cerebellar degeneration   . Chronic pericarditis   . Chronic urinary tract infection   . Complication of anesthesia    low o2 sats, coded 30 years ago  . CVA (cerebral infarction) 05/2003  . Depression   . Gait disorder   . Gastric polyps   . GERD (gastroesophageal reflux disease)   . High cholesterol   . Hyperlipidemia   . Hypertension   . Hypertension   . Hypotension   . Hypothyroidism   . IBS (irritable bowel syndrome)   . Obesity   . Paroxysmal atrial fibrillation ([name])  chads2vasc score is 6,  she is felt to be a poor candidate for anticoagulation  . Personal history of arterial venous malformation (AVM)    right side of face  . Seizure disorder ([place])   . Seizures ([place])   . Sternum fx 10/27/2013  . Stroke [facility]) 5 years ago   Right side of face weak, slurred speach  . Thyroid disease   . TIA (transient ischemic attack)     Past Surgical History:  Procedure Laterality Date  . APPENDECTOMY  82 years old  . COLONOSCOPY  2006, 2009  . cyst removed  35 years ago  . EP IMPLANTABLE DEVICE N/A  01/06/2015   Procedure: Loop Recorder Insertion;  Surgeon: Thompson Grayer, MD;  Location: [facility];  Service: Cardiovascular;  Laterality: N/A;  . KNEE ARTHROSCOPY Right 11/14/2006  . KNEE ARTHROSCOPY Bilateral 5 and 6 years ago  . KNEE ARTHROSCOPY WITH LATERAL MENISECTOMY  07/03/2012   Procedure: KNEE ARTHROSCOPY WITH LATERAL MENISECTOMY;  Surgeon: Magnus Sinning, MD;  Location: [facility];  Service: Orthopedics;  Laterality: Left;  with Partial Lateral Menisectomy and Medial Menisectomy. Shaving of medial and lateral femoral condyles. Shaving of patella. Removal of a loose body  . tibial and fibular internal fixation Left   . TOTAL ABDOMINAL HYSTERECTOMY  82 years old  . UPPER GASTROINTESTINAL ENDOSCOPY  2009, 2013    Current Outpatient Prescriptions  Medication Sig Dispense Refill  . albuterol (PROVENTIL HFA;VENTOLIN HFA) 108 (90 Base) MCG/ACT inhaler Inhale 2 puffs into the lungs every 6 (six) hours as needed for wheezing or shortness of breath. 1 Inhaler 2  . amitriptyline (ELAVIL) 50 MG tablet Take 0.5 tablets (25 mg total) by mouth at bedtime. 90 tablet 2  . benzonatate (TESSALON) 100 MG capsule Take 1 capsule (100 mg total) by mouth 2 (two) times daily as needed for cough. 20 capsule 0  . escitalopram (LEXAPRO) 20 MG tablet Take 1 tablet (20 mg total) by mouth at bedtime. 90 tablet 2  . fluticasone (FLONASE) 50 MCG/ACT nasal spray Place 2 sprays into both nostrils daily. 16 g 6  . furosemide (LASIX) 40 MG tablet Take 1 tablet (40 mg total) by mouth daily as needed for fluid. 30 tablet 3  . gabapentin (NEURONTIN) 300 MG capsule Take 1 capsule (300 mg total) by mouth 3 (three) times daily. 270 capsule 3  . levothyroxine (SYNTHROID, LEVOTHROID) 75 MCG tablet Take 1 tablet (75 mcg total) by mouth daily before breakfast. 90 tablet 2  . losartan (COZAAR) 50 MG tablet Take 1 tablet (50 mg total) by mouth daily. (Patient taking differently: Take 50 mg by mouth at bedtime. ) 90 tablet 2  .  lovastatin (MEVACOR) 40 MG tablet Take 1 tablet (40 mg total) by mouth at bedtime. 90 tablet 3  . omeprazole (PRILOSEC) 40 MG capsule Take 1 capsule (40 mg total) by mouth daily. 90 capsule 3  . vitamin B-12 (CYANOCOBALAMIN) 1000 MCG tablet Take 1,000 mcg by mouth every 14 (fourteen) days.      No current facility-administered medications for this visit.     Allergies as of 03/07/2016 - Review Complete 03/07/2016  Allergen Reaction Noted  . Lisinopril Cough 05/02/2015    Family History  Problem Relation Age of Onset  . Heart attack Father 76    fatal  . Coronary artery disease Brother   . Diabetes Brother   . Prostate cancer Brother   . Prostate cancer Son     Social History  Social History  . Marital status: Married    Spouse name: N/A  . Number of children: N/A  . Years of education: N/A   Occupational History  . Not on file.   Social History Main Topics  . Smoking status: Former Smoker    Packs/day: 0.25    Years: 10.00    Types: Cigarettes    Quit date: 07/31/1975  . Smokeless tobacco: Never Used  . Alcohol use Yes     Comment: 2 glasses a year  . Drug use: No  . Sexual activity: Not on file   Other Topics Concern  . Not on file   Social History Narrative   Lives with husband, does have stairs, does not use them. Pt completed 10th grade.    Review of Systems:    Constitutional: Positive for fatigue No weight loss, fever, chills or weakness  HEENT: Eyes: No change in vision               Ears, Nose, Throat:  No change in hearing or congestion Skin: No rash or itching Cardiovascular: No chest pain, chest pressure or palpitations Respiratory: No SOB or cough Gastrointestinal: See HPI and otherwise negative Genitourinary: Positive for increase in urine frequency Neurological: Positive for recent TIA in the last month which was not evaluated and seizure disorder No headache or dizziness Musculoskeletal: No new muscle or back pain Hematologic: No bleeding  or bruising Psychiatric: No history of depression or anxiety   Physical Exam:  Vital signs: BP 108/60   Pulse 80   General:   Pleasant Elderly overweight female appears to be in NAD, Well developed, Well nourished, alert and cooperative Head:  Normocephalic and atraumatic. Eyes:   PEERL, EOMI. No icterus. Conjunctiva pink. Ears:  Normal auditory acuity. Neck:  Supple Throat: Oral cavity and pharynx without inflammation, swelling or lesion.  Lungs: Respirations even and unlabored. Lungs clear to auscultation bilaterally.   No wheezes, crackles, or rhonchi.  Heart: Normal S1, S2. No MRG. Regular rate and rhythm. No peripheral edema, cyanosis or pallor.  Abdomen:  Soft, nondistended, nontender. No rebound or guarding. Normal bowel sounds. No appreciable masses or hepatomegaly. Rectal:  Not performed.  Msk:  Ambulates in wheelchair Symmetrical without gross deformities. Peripheral pulses intact.  Extremities:  Without edema, no deformity or joint abnormality.  Neurologic:  Alert and  oriented x4;  grossly normal neurologically.  Skin:   Dry and intact without significant lesions or rashes. Psychiatric: Oriented to person, place and time. Demonstrates good judgement and reason without abnormal affect or behaviors.  RELEVANT LABS AND IMAGING: CBC    Component Value Date/Time   WBC 8.9 03/01/2016 1716   WBC 9.0 10/24/2015 0613   RBC 4.45 03/01/2016 1716   RBC 4.15 10/24/2015 0613   HGB 12.2 10/24/2015 0613   HCT 40.9 03/01/2016 1716   PLT 240 03/01/2016 1716   MCV 92 03/01/2016 1716   MCH 29.4 03/01/2016 1716   MCH 29.4 10/24/2015 0613   MCHC 32.0 03/01/2016 1716   MCHC 32.5 10/24/2015 0613   RDW 13.7 03/01/2016 1716   LYMPHSABS 2.3 03/01/2016 1716   MONOABS 0.7 10/23/2015 2112   EOSABS 0.1 03/01/2016 1716   BASOSABS 0.0 03/01/2016 1716    CMP     Component Value Date/Time   NA 132 (L) 03/01/2016 1716   K 4.5 03/01/2016 1716   CL 93 (L) 03/01/2016 1716   CO2 23  03/01/2016 1716   GLUCOSE 111 (H) 03/01/2016 1716  GLUCOSE 175 (H) 10/24/2015 0613   BUN 10 03/01/2016 1716   CREATININE 1.01 (H) 03/01/2016 1716   CALCIUM 9.8 03/01/2016 1716   PROT 7.1 03/01/2016 1716   ALBUMIN 4.4 03/01/2016 1716   AST 15 03/01/2016 1716   ALT 10 03/01/2016 1716   ALKPHOS 74 03/01/2016 1716   BILITOT 0.4 03/01/2016 1716   GFRNONAA 53 (L) 03/01/2016 1716   GFRAA 61 03/01/2016 1716    Assessment: 1. Diarrhea: 4-5 weeks of watery loose incontinent stools, recent stool culture, C. difficile and ova and parasites negative, no change with Imodium 4 tabs per day, history of some diarrhea in the past, no change with ciprofloxacin 500 mg twice a day 3 days from primary care provider; consider microscopic colitis versus and infectious cause versus IBS versus other  Plan: 1. Prescribed Lomotil 2.5 mg tabs, 1 tab by mouth 4 times daily. #120 2. Discussed colonoscopy, explained that the patient is at higher risk for this procedure and sedation due to her multiple comorbidities including seizure disorder and recent and frequent TIAs, per chart review patient has also been hard to awake from sedation in the past. Patient would like a colonoscopy for further evaluation at this time. Discussed risks, benefits, limitations and alternatives this procedure and the patient agrees to proceed. Will discuss further with Dr. Carlean Purl. This procedure would need to be scheduled in the hospital. 3. Ordered GI pathogen panel for thorough evaluation of infectious cause 4. Will discuss with Dr. Carlean Purl for any further recommendations or alternatives prior to colonoscopy.  Ellouise Newer, PA-C [facility] 03/07/2016, 1:38 PM  Cc: Sharion Balloon, FNP

## 2016-03-07 NOTE — Progress Notes (Signed)
Agree w/ Ms. Lemmon's note and management. Colonoscopy is appropriate. If at all possible we will try to do this week if not as soon as feasible.

## 2016-03-08 ENCOUNTER — Other Ambulatory Visit: Payer: Medicare Other

## 2016-03-08 ENCOUNTER — Telehealth: Payer: Self-pay | Admitting: Emergency Medicine

## 2016-03-08 DIAGNOSIS — R197 Diarrhea, unspecified: Secondary | ICD-10-CM

## 2016-03-08 NOTE — Telephone Encounter (Signed)
I spoke with patient regarding scheduling procedure for tomorrow, she would like to know what else is available? I urged patient to consider tomorrow but called [facility] and they did not have any availability for tomorrow.   Please advise.

## 2016-03-08 NOTE — Telephone Encounter (Addendum)
-----   Message from Levin Erp, [place] sent at 03/08/2016  8:50 AM EDT ----- Regarding: Can we see if we can arrange this? She would need to start clears ASAP and then prep today??    Thanks-JLL ----- Message ----- From: Gatha Mayer, MD Sent: 03/07/2016   6:35 PM To: Levin Erp, PA  I can scope her Friday at hospital if we can pull that off.  Would probably do at [facility] if they can fit me in there - early AM  We can work on this tomorrow  OK to text me then and also when things like this come up worth a shot to text me or call me when they are in the office - am ok with that.  Thanks!  CEG ----- Message ----- From: Levin Erp, PA Sent: 03/07/2016   1:55 PM To: Gatha Mayer, MD  Dr. Darnell Level,   Can you please review this patient's chart and recent visit. She would like colo for further eval of diarrhea and incontinence, but does have multiple co morbidities, started scheduled Lomotil today, do you have any other suggestions before proceeding with colonoscopy?  If you are ok with colo, this will need to be scheduled in hospital setting due to co morbidities, do you have any spots in the near future?  Thank you, Ellouise Newer, PA-C   Called patients home phone number with no answer and no voicemail. Left message on husbands cell to call office.

## 2016-03-08 NOTE — Telephone Encounter (Signed)
Called home phone again with no answer.

## 2016-03-09 LAB — GASTROINTESTINAL PATHOGEN PANEL PCR
C. difficile Tox A/B, PCR: NOT DETECTED
CAMPYLOBACTER, PCR: NOT DETECTED
CRYPTOSPORIDIUM, PCR: NOT DETECTED
E COLI (ETEC) LT/ST, PCR: NOT DETECTED
E coli (STEC) stx1/stx2, PCR: NOT DETECTED
E coli 0157, PCR: NOT DETECTED
GIARDIA LAMBLIA, PCR: NOT DETECTED
NOROVIRUS, PCR: NOT DETECTED
Rotavirus A, PCR: NOT DETECTED
Salmonella, PCR: NOT DETECTED
Shigella, PCR: NOT DETECTED

## 2016-03-12 ENCOUNTER — Other Ambulatory Visit: Payer: Self-pay | Admitting: Family

## 2016-03-12 DIAGNOSIS — R609 Edema, unspecified: Secondary | ICD-10-CM

## 2016-03-12 LAB — CUP PACEART REMOTE DEVICE CHECK: Date Time Interrogation Session: 20170803150845

## 2016-03-15 ENCOUNTER — Other Ambulatory Visit: Payer: Self-pay

## 2016-03-15 DIAGNOSIS — R609 Edema, unspecified: Secondary | ICD-10-CM

## 2016-03-15 DIAGNOSIS — H43822 Vitreomacular adhesion, left eye: Secondary | ICD-10-CM | POA: Diagnosis not present

## 2016-03-15 MED ORDER — FUROSEMIDE 40 MG PO TABS: ORAL_TABLET | ORAL | 0 refills | Status: DC

## 2016-03-21 ENCOUNTER — Other Ambulatory Visit: Payer: Self-pay | Admitting: Emergency Medicine

## 2016-03-21 DIAGNOSIS — R197 Diarrhea, unspecified: Secondary | ICD-10-CM

## 2016-03-21 MED ORDER — NA SULFATE-K SULFATE-MG SULF 17.5-3.13-1.6 GM/177ML PO SOLN: 1.0000 | ORAL | 0 refills | Status: DC

## 2016-03-21 NOTE — Telephone Encounter (Signed)
Spoke to patient and scheduled her for procedure on 8/30 at 1 pm at [facility]. Went over instructions on the phone and will mail them to patient as well.

## 2016-03-21 NOTE — Telephone Encounter (Signed)
If she can do 8/30 I can do at 1 or 130ish Will do moderate or MAC

## 2016-03-23 ENCOUNTER — Other Ambulatory Visit: Payer: Self-pay | Admitting: *Deleted

## 2016-03-23 ENCOUNTER — Telehealth: Payer: Self-pay | Admitting: Physician Assistant

## 2016-03-23 MED ORDER — NA SULFATE-K SULFATE-MG SULF 17.5-3.13-1.6 GM/177ML PO SOLN: 1.0000 | Freq: Once | ORAL | 0 refills | Status: AC

## 2016-03-23 NOTE — Telephone Encounter (Signed)
[facility] and let them know prescription was an error, meant to be sent to her local pharmacy.

## 2016-03-23 NOTE — Progress Notes (Signed)
Sent to [facility] [place] for the colonoscopy.

## 2016-03-25 DIAGNOSIS — R300 Dysuria: Secondary | ICD-10-CM | POA: Diagnosis not present

## 2016-03-25 DIAGNOSIS — N3001 Acute cystitis with hematuria: Secondary | ICD-10-CM | POA: Diagnosis not present

## 2016-03-27 ENCOUNTER — Encounter (HOSPITAL_COMMUNITY): Payer: Self-pay | Admitting: *Deleted

## 2016-03-27 DIAGNOSIS — N390 Urinary tract infection, site not specified: Secondary | ICD-10-CM

## 2016-03-27 HISTORY — DX: Urinary tract infection, site not specified: N39.0

## 2016-03-28 ENCOUNTER — Encounter: Payer: Self-pay | Admitting: *Deleted

## 2016-03-28 ENCOUNTER — Encounter (HOSPITAL_COMMUNITY)
Admission: RE | Disposition: A | Discharge status: Home / Self Care | Payer: Self-pay | Source: Ambulatory Visit | Attending: Internal Medicine

## 2016-03-28 ENCOUNTER — Encounter (HOSPITAL_COMMUNITY): Payer: Self-pay | Admitting: *Deleted

## 2016-03-28 ENCOUNTER — Ambulatory Visit (HOSPITAL_COMMUNITY): Payer: Medicare Other | Admitting: Certified Registered Nurse Anesthetist

## 2016-03-28 ENCOUNTER — Ambulatory Visit (HOSPITAL_COMMUNITY)
Admission: RE | Admit: 2016-03-28 | Discharge: 2016-03-28 | Disposition: A | Discharge status: Home / Self Care | Payer: Medicare Other | Source: Ambulatory Visit | Attending: Internal Medicine | Admitting: Internal Medicine

## 2016-03-28 DIAGNOSIS — Z8669 Personal history of other diseases of the nervous system and sense organs: Secondary | ICD-10-CM | POA: Diagnosis not present

## 2016-03-28 DIAGNOSIS — R197 Diarrhea, unspecified: Secondary | ICD-10-CM | POA: Diagnosis not present

## 2016-03-28 DIAGNOSIS — E7800 Pure hypercholesterolemia, unspecified: Secondary | ICD-10-CM | POA: Diagnosis not present

## 2016-03-28 DIAGNOSIS — I480 Paroxysmal atrial fibrillation: Secondary | ICD-10-CM | POA: Insufficient documentation

## 2016-03-28 DIAGNOSIS — Z79899 Other long term (current) drug therapy: Secondary | ICD-10-CM | POA: Diagnosis not present

## 2016-03-28 DIAGNOSIS — Z87891 Personal history of nicotine dependence: Secondary | ICD-10-CM | POA: Insufficient documentation

## 2016-03-28 DIAGNOSIS — E785 Hyperlipidemia, unspecified: Secondary | ICD-10-CM | POA: Insufficient documentation

## 2016-03-28 DIAGNOSIS — Z8673 Personal history of transient ischemic attack (TIA), and cerebral infarction without residual deficits: Secondary | ICD-10-CM | POA: Insufficient documentation

## 2016-03-28 DIAGNOSIS — K219 Gastro-esophageal reflux disease without esophagitis: Secondary | ICD-10-CM | POA: Diagnosis not present

## 2016-03-28 DIAGNOSIS — E039 Hypothyroidism, unspecified: Secondary | ICD-10-CM | POA: Insufficient documentation

## 2016-03-28 DIAGNOSIS — F329 Major depressive disorder, single episode, unspecified: Secondary | ICD-10-CM | POA: Diagnosis not present

## 2016-03-28 DIAGNOSIS — G40909 Epilepsy, unspecified, not intractable, without status epilepticus: Secondary | ICD-10-CM | POA: Insufficient documentation

## 2016-03-28 DIAGNOSIS — I10 Essential (primary) hypertension: Secondary | ICD-10-CM | POA: Insufficient documentation

## 2016-03-28 DIAGNOSIS — J449 Chronic obstructive pulmonary disease, unspecified: Secondary | ICD-10-CM | POA: Diagnosis not present

## 2016-03-28 HISTORY — DX: Urinary tract infection, site not specified: N39.0

## 2016-03-28 HISTORY — PX: COLONOSCOPY WITH PROPOFOL: SHX5780

## 2016-03-28 HISTORY — DX: Acute respiratory failure with hypoxia: J96.01

## 2016-03-28 HISTORY — DX: Reserved for inherently not codable concepts without codable children: IMO0001

## 2016-03-28 SURGERY — COLONOSCOPY WITH PROPOFOL
Anesthesia: Monitor Anesthesia Care

## 2016-03-28 MED ORDER — SODIUM CHLORIDE 0.9 % IV SOLN
INTRAVENOUS | Status: DC
Start: 2016-03-28 — End: 2016-03-28
  Administered 2016-03-28: 13:00:00 via INTRAVENOUS

## 2016-03-28 MED ORDER — PROPOFOL 10 MG/ML IV BOLUS
INTRAVENOUS | Status: DC | PRN
  Administered 2016-03-28 (×3): 30 mg via INTRAVENOUS

## 2016-03-28 MED ORDER — PROPOFOL 500 MG/50ML IV EMUL
INTRAVENOUS | Status: DC | PRN
  Administered 2016-03-28: 75 ug/kg/min via INTRAVENOUS

## 2016-03-28 NOTE — Op Note (Signed)
[facility] Patient Name: [name] Procedure Date : 03/28/2016 MRN: [id number] Attending MD: Gatha Mayer , MD Date of Birth: 07/19/1934 CSN: [id number] Age: 82 Admit Type: Outpatient Procedure:                Colonoscopy Indications:              Clinically significant diarrhea of unexplained                            origin Providers:                Gatha Mayer, MD, Kingsley Plan, RN, Corliss Parish, Technician Referring MD:              Medicines:                Propofol per Anesthesia, Monitored Anesthesia Care Complications:            No immediate complications. Estimated blood loss:                            None. Estimated Blood Loss:     Estimated blood loss: none. Procedure:                Pre-Anesthesia Assessment:                           - Prior to the procedure, a History and Physical                            was performed, and patient medications and                            allergies were reviewed. The patient's tolerance of                            previous anesthesia was also reviewed. The risks                            and benefits of the procedure and the sedation                            options and risks were discussed with the patient.                            All questions were answered, and informed consent                            was obtained. Prior Anticoagulants: The patient has                            taken no previous anticoagulant or antiplatelet                            agents. ASA Grade Assessment: III -  A patient with                            severe systemic disease. After reviewing the risks                            and benefits, the patient was deemed in                            satisfactory condition to undergo the procedure.                           After obtaining informed consent, the colonoscope                            was passed under direct vision.  Throughout the                            procedure, the patient's blood pressure, pulse, and                            oxygen saturations were monitored continuously. The                            EC-[id number] ([id number]) scope was introduced through                            the anus and advanced to the the cecum, identified                            by appendiceal orifice and ileocecal valve. The                            quality of the bowel preparation was excellent. The                            colonoscopy was performed without difficulty. The                            patient tolerated the procedure well. The bowel                            preparation used was Miralax. The ileocecal valve,                            appendiceal orifice, and rectum were photographed. Scope In: 1:24:34 PM Scope Out: 1:32:29 PM Scope Withdrawal Time: 0 hours 2 minutes 42 seconds  Total Procedure Duration: 0 hours 7 minutes 55 seconds  Findings:      The perianal and digital rectal examinations were normal.      The colon (entire examined portion) appeared normal.      No additional abnormalities were found on retroflexion. Impression:               - The entire examined colon is normal.                           -  No specimens collected.                           - IBS Flare suspected - she is better Moderate Sedation:      Please see anesthesia notes, moderate sedation not given Recommendation:           - Patient has a contact number available for                            emergencies. The signs and symptoms of potential                            delayed complications were discussed with the                            patient. Return to normal activities tomorrow.                            Written discharge instructions were provided to the                            patient.                           - Resume previous diet.                           - Continue present medications.                            - Patient has a contact number available for                            emergencies. The signs and symptoms of potential                            delayed complications were discussed with the                            patient. Return to normal activities tomorrow.                            Written discharge instructions were provided to the                            patient.                           - No repeat colonoscopy due to age.                           - Return to my office PRN. Procedure Code(s):        --- Professional ---                           [phone], Colonoscopy, flexible; diagnostic, including  collection of specimen(s) by brushing or washing,                            when performed (separate procedure) Diagnosis Code(s):        --- Professional ---                           R19.7, Diarrhea, unspecified CPT copyright 2016 American Medical Association. All rights reserved. The codes documented in this report are preliminary and upon coder review may  be revised to meet current compliance requirements. Gatha Mayer, MD 03/28/2016 1:51:01 PM This report has been signed electronically. Number of Addenda: 0

## 2016-03-28 NOTE — Anesthesia Postprocedure Evaluation (Signed)
Anesthesia Post Note  Patient: Lewanda Perea  Procedure(s) Performed: Procedure(s) (LRB): COLONOSCOPY WITH PROPOFOL (N/A)  Patient location during evaluation: Endoscopy Anesthesia Type: MAC Level of consciousness: awake and alert, patient cooperative and oriented Pain management: pain level controlled Vital Signs Assessment: post-procedure vital signs reviewed and stable Respiratory status: spontaneous breathing, nonlabored ventilation and respiratory function stable Cardiovascular status: stable and blood pressure returned to baseline Postop Assessment: no signs of nausea or vomiting Anesthetic complications: no    Last Vitals:  Vitals:   03/28/16 1350 03/28/16 1400  BP: (!) 170/67 (!) 180/64  Pulse: 70 67  Resp: 19 13  Temp:      Last Pain:  Vitals:   03/28/16 1340  TempSrc: Oral                 Jovita Persing,E. Blaize Nipper

## 2016-03-28 NOTE — H&P (View-Only) (Signed)
Chief Complaint: Diarrhea  HPI:  [name] is a 82 year old Caucasian female with multiple medical problems including CVA, reflux, seizure disorder and A. Fib,  who was referred to me by Sharion Balloon, FNP for a complaint of diarrhea.  The patient has followed with Dr. Carlean Purl in the past, most recently 03/03/14 for a change in color of stools and was placed on metronidazole 250 mg 3 times a day 10 days.   Independent review of report and images from patient's last colonoscopy completed on 03/23/2008 showed external hemorrhoids and otherwise normal exam. Repeat was recommended years. Patient also had an EGD in 05/07/12 which showed a tortuous esophagus, multiple sessile polyps in the gastric fundus and body and otherwise normal stomach and duodenal mucosa.  Recent labs completed 03/01/2016 revealing normal CBC and CMP as well as sedimentation rate. Recent stool studies completed 02/23/2016 revealed a negative stool culture, O&P 1 and C. Difficile.     Patient recently saw her primary care physician Dr. Evette Doffing and at that time complained of watery diarrhea she was given 3 days of ciprofloxacin 500 mg twice a day on 03/28/16.  Today, the patient presents to clinic accompanied by her husband. She tells me that she has been having watery diarrhea for 4-5 weeks now. The patient notes that at first she would have 4-5 loose stools per day which were unpredictable. Now within the past 2 weeks she typically has 2 loose bowel movements per day which come on with "no abdominal pain or signal" that they are coming. The patient has been incontinent on a daily basis. Patient describes the stool as watery. She did recently finish the ciprofloxacin prescribed by her primary care provider, but this seemed to make no difference. She has also been taking 4 Imodium pills per day and eating Activia which has not helped. The patient does tell me that she was on antibiotics for a urinary tract infection recently, but this  was after onset of diarrhea and likely secondary to this. Associated symptoms include a decreased appetite.  Patient's medical history is positive for a recent TIA at home 1-2 months ago, patient never went to the hospital or clinic for this, as she has these "quite frequently", this is thought due to the fact that she cannot be on anticoagulation and has A. fib. Patient also has a seizure disorder with multiple seizures per month. This typically only happens when she "forgets her medicine".  Patient denies fever, chills, blood in her stool, melena, recent travel, change in medications, change in diet, weight loss, nausea, vomiting, heartburn, reflux or abdominal pain.    Past Medical History:  Diagnosis Date  . Allergic rhinitis    PT. DENIES  . Anxiety   . Arthritis    NECK  . Ataxia   . Bradycardia    primarily nocturnal  . Burning tongue syndrome 25 years  . Cataract   . Cerebellar degeneration   . Chronic pericarditis   . Chronic urinary tract infection   . Complication of anesthesia    low o2 sats, coded 30 years ago  . CVA (cerebral infarction) 05/2003  . Depression   . Gait disorder   . Gastric polyps   . GERD (gastroesophageal reflux disease)   . High cholesterol   . Hyperlipidemia   . Hypertension   . Hypertension   . Hypotension   . Hypothyroidism   . IBS (irritable bowel syndrome)   . Obesity   . Paroxysmal atrial fibrillation ([name])  chads2vasc score is 6,  she is felt to be a poor candidate for anticoagulation  . Personal history of arterial venous malformation (AVM)    right side of face  . Seizure disorder ([place])   . Seizures ([place])   . Sternum fx 10/27/2013  . Stroke [facility]) 5 years ago   Right side of face weak, slurred speach  . Thyroid disease   . TIA (transient ischemic attack)     Past Surgical History:  Procedure Laterality Date  . APPENDECTOMY  81 years old  . COLONOSCOPY  2006, 2009  . cyst removed  35 years ago  . EP IMPLANTABLE DEVICE N/A  01/06/2015   Procedure: Loop Recorder Insertion;  Surgeon: Thompson Grayer, MD;  Location: [facility];  Service: Cardiovascular;  Laterality: N/A;  . KNEE ARTHROSCOPY Right 11/14/2006  . KNEE ARTHROSCOPY Bilateral 5 and 6 years ago  . KNEE ARTHROSCOPY WITH LATERAL MENISECTOMY  07/03/2012   Procedure: KNEE ARTHROSCOPY WITH LATERAL MENISECTOMY;  Surgeon: Magnus Sinning, MD;  Location: [facility];  Service: Orthopedics;  Laterality: Left;  with Partial Lateral Menisectomy and Medial Menisectomy. Shaving of medial and lateral femoral condyles. Shaving of patella. Removal of a loose body  . tibial and fibular internal fixation Left   . TOTAL ABDOMINAL HYSTERECTOMY  82 years old  . UPPER GASTROINTESTINAL ENDOSCOPY  2009, 2013    Current Outpatient Prescriptions  Medication Sig Dispense Refill  . albuterol (PROVENTIL HFA;VENTOLIN HFA) 108 (90 Base) MCG/ACT inhaler Inhale 2 puffs into the lungs every 6 (six) hours as needed for wheezing or shortness of breath. 1 Inhaler 2  . amitriptyline (ELAVIL) 50 MG tablet Take 0.5 tablets (25 mg total) by mouth at bedtime. 90 tablet 2  . benzonatate (TESSALON) 100 MG capsule Take 1 capsule (100 mg total) by mouth 2 (two) times daily as needed for cough. 20 capsule 0  . escitalopram (LEXAPRO) 20 MG tablet Take 1 tablet (20 mg total) by mouth at bedtime. 90 tablet 2  . fluticasone (FLONASE) 50 MCG/ACT nasal spray Place 2 sprays into both nostrils daily. 16 g 6  . furosemide (LASIX) 40 MG tablet Take 1 tablet (40 mg total) by mouth daily as needed for fluid. 30 tablet 3  . gabapentin (NEURONTIN) 300 MG capsule Take 1 capsule (300 mg total) by mouth 3 (three) times daily. 270 capsule 3  . levothyroxine (SYNTHROID, LEVOTHROID) 75 MCG tablet Take 1 tablet (75 mcg total) by mouth daily before breakfast. 90 tablet 2  . losartan (COZAAR) 50 MG tablet Take 1 tablet (50 mg total) by mouth daily. (Patient taking differently: Take 50 mg by mouth at bedtime. ) 90 tablet 2  .  lovastatin (MEVACOR) 40 MG tablet Take 1 tablet (40 mg total) by mouth at bedtime. 90 tablet 3  . omeprazole (PRILOSEC) 40 MG capsule Take 1 capsule (40 mg total) by mouth daily. 90 capsule 3  . vitamin B-12 (CYANOCOBALAMIN) 1000 MCG tablet Take 1,000 mcg by mouth every 14 (fourteen) days.      No current facility-administered medications for this visit.     Allergies as of 03/07/2016 - Review Complete 03/07/2016  Allergen Reaction Noted  . Lisinopril Cough 05/02/2015    Family History  Problem Relation Age of Onset  . Heart attack Father 47    fatal  . Coronary artery disease Brother   . Diabetes Brother   . Prostate cancer Brother   . Prostate cancer Son     Social History  Social History  . Marital status: Married    Spouse name: N/A  . Number of children: N/A  . Years of education: N/A   Occupational History  . Not on file.   Social History Main Topics  . Smoking status: Former Smoker    Packs/day: 0.25    Years: 10.00    Types: Cigarettes    Quit date: 07/31/1975  . Smokeless tobacco: Never Used  . Alcohol use Yes     Comment: 2 glasses a year  . Drug use: No  . Sexual activity: Not on file   Other Topics Concern  . Not on file   Social History Narrative   Lives with husband, does have stairs, does not use them. Pt completed 10th grade.    Review of Systems:    Constitutional: Positive for fatigue No weight loss, fever, chills or weakness  HEENT: Eyes: No change in vision               Ears, Nose, Throat:  No change in hearing or congestion Skin: No rash or itching Cardiovascular: No chest pain, chest pressure or palpitations Respiratory: No SOB or cough Gastrointestinal: See HPI and otherwise negative Genitourinary: Positive for increase in urine frequency Neurological: Positive for recent TIA in the last month which was not evaluated and seizure disorder No headache or dizziness Musculoskeletal: No new muscle or back pain Hematologic: No bleeding  or bruising Psychiatric: No history of depression or anxiety   Physical Exam:  Vital signs: BP 108/60   Pulse 80   General:   Pleasant Elderly overweight female appears to be in NAD, Well developed, Well nourished, alert and cooperative Head:  Normocephalic and atraumatic. Eyes:   PEERL, EOMI. No icterus. Conjunctiva pink. Ears:  Normal auditory acuity. Neck:  Supple Throat: Oral cavity and pharynx without inflammation, swelling or lesion.  Lungs: Respirations even and unlabored. Lungs clear to auscultation bilaterally.   No wheezes, crackles, or rhonchi.  Heart: Normal S1, S2. No MRG. Regular rate and rhythm. No peripheral edema, cyanosis or pallor.  Abdomen:  Soft, nondistended, nontender. No rebound or guarding. Normal bowel sounds. No appreciable masses or hepatomegaly. Rectal:  Not performed.  Msk:  Ambulates in wheelchair Symmetrical without gross deformities. Peripheral pulses intact.  Extremities:  Without edema, no deformity or joint abnormality.  Neurologic:  Alert and  oriented x4;  grossly normal neurologically.  Skin:   Dry and intact without significant lesions or rashes. Psychiatric: Oriented to person, place and time. Demonstrates good judgement and reason without abnormal affect or behaviors.  RELEVANT LABS AND IMAGING: CBC    Component Value Date/Time   WBC 8.9 03/01/2016 1716   WBC 9.0 10/24/2015 0613   RBC 4.45 03/01/2016 1716   RBC 4.15 10/24/2015 0613   HGB 12.2 10/24/2015 0613   HCT 40.9 03/01/2016 1716   PLT 240 03/01/2016 1716   MCV 92 03/01/2016 1716   MCH 29.4 03/01/2016 1716   MCH 29.4 10/24/2015 0613   MCHC 32.0 03/01/2016 1716   MCHC 32.5 10/24/2015 0613   RDW 13.7 03/01/2016 1716   LYMPHSABS 2.3 03/01/2016 1716   MONOABS 0.7 10/23/2015 2112   EOSABS 0.1 03/01/2016 1716   BASOSABS 0.0 03/01/2016 1716    CMP     Component Value Date/Time   NA 132 (L) 03/01/2016 1716   K 4.5 03/01/2016 1716   CL 93 (L) 03/01/2016 1716   CO2 23  03/01/2016 1716   GLUCOSE 111 (H) 03/01/2016 1716  GLUCOSE 175 (H) 10/24/2015 0613   BUN 10 03/01/2016 1716   CREATININE 1.01 (H) 03/01/2016 1716   CALCIUM 9.8 03/01/2016 1716   PROT 7.1 03/01/2016 1716   ALBUMIN 4.4 03/01/2016 1716   AST 15 03/01/2016 1716   ALT 10 03/01/2016 1716   ALKPHOS 74 03/01/2016 1716   BILITOT 0.4 03/01/2016 1716   GFRNONAA 53 (L) 03/01/2016 1716   GFRAA 61 03/01/2016 1716    Assessment: 1. Diarrhea: 4-5 weeks of watery loose incontinent stools, recent stool culture, C. difficile and ova and parasites negative, no change with Imodium 4 tabs per day, history of some diarrhea in the past, no change with ciprofloxacin 500 mg twice a day 3 days from primary care provider; consider microscopic colitis versus and infectious cause versus IBS versus other  Plan: 1. Prescribed Lomotil 2.5 mg tabs, 1 tab by mouth 4 times daily. #120 2. Discussed colonoscopy, explained that the patient is at higher risk for this procedure and sedation due to her multiple comorbidities including seizure disorder and recent and frequent TIAs, per chart review patient has also been hard to awake from sedation in the past. Patient would like a colonoscopy for further evaluation at this time. Discussed risks, benefits, limitations and alternatives this procedure and the patient agrees to proceed. Will discuss further with Dr. Carlean Purl. This procedure would need to be scheduled in the hospital. 3. Ordered GI pathogen panel for thorough evaluation of infectious cause 4. Will discuss with Dr. Carlean Purl for any further recommendations or alternatives prior to colonoscopy.  Ellouise Newer, PA-C [facility] 03/07/2016, 1:38 PM  Cc: Sharion Balloon, FNP

## 2016-03-28 NOTE — Anesthesia Preprocedure Evaluation (Addendum)
Anesthesia Evaluation  Patient identified by MRN, date of birth, ID band Patient awake    Reviewed: Allergy & Precautions, NPO status , Patient's Chart, lab work & pertinent test results  History of Anesthesia Complications Negative for: history of anesthetic complications  Airway Mallampati: I  TM Distance: >3 FB Neck ROM: Full    Dental  (+) Caps, Dental Advisory Given, Missing   Pulmonary asthma , COPD,  COPD inhaler, former smoker,    breath sounds clear to auscultation       Cardiovascular hypertension, Pt. on medications (-) angina+ dysrhythmias  Rhythm:Regular Rate:Normal  3/17 ECHO: EF 55-60%, valves OK   Neuro/Psych Seizures -, Well Controlled,  Anxiety Depression TIACVA (no residual weakness, but balance is off)    GI/Hepatic Neg liver ROS, GERD  Medicated and Controlled,  Endo/Other  Hypothyroidism Morbid obesity  Renal/GU negative Renal ROS     Musculoskeletal  (+) Arthritis ,   Abdominal (+) + obese,   Peds  Hematology negative hematology ROS (+)   Anesthesia Other Findings   Reproductive/Obstetrics                            Anesthesia Physical Anesthesia Plan  ASA: III  Anesthesia Plan: MAC   Post-op Pain Management:    Induction:   Airway Management Planned: Natural Airway and Simple Face Mask  Additional Equipment:   Intra-op Plan:   Post-operative Plan:   Informed Consent: I have reviewed the patients History and Physical, chart, labs and discussed the procedure including the risks, benefits and alternatives for the proposed anesthesia with the patient or authorized representative who has indicated his/her understanding and acceptance.   Dental advisory given  Plan Discussed with: CRNA and Surgeon  Anesthesia Plan Comments: (Plan routine monitors, MAC)        Anesthesia Quick Evaluation

## 2016-03-28 NOTE — Discharge Instructions (Signed)
° °  The colonoscopy was normal - not sure why you had the diarrhea but suspect it was your Irritable Bowel Syndrome acting up.  I appreciate the opportunity to care for you. Gatha Mayer, MD, FACG     YOU HAD AN ENDOSCOPIC PROCEDURE TODAY: Refer to the procedure report and other information in the discharge instructions given to you for any specific questions about what was found during the examination. If this information does not answer your questions, please call [place] office at [phone] to clarify.   YOU SHOULD EXPECT: Some feelings of bloating in the abdomen. Passage of more gas than usual. Walking can help get rid of the air that was put into your GI tract during the procedure and reduce the bloating. If you had a lower endoscopy (such as a colonoscopy or flexible sigmoidoscopy) you may notice spotting of blood in your stool or on the toilet paper. Some abdominal soreness may be present for a day or two, also.  DIET: Your first meal following the procedure should be a light meal and then it is ok to progress to your normal diet. A half-sandwich or bowl of soup is an example of a good first meal. Heavy or fried foods are harder to digest and may make you feel nauseous or bloated. Drink plenty of fluids but you should avoid alcoholic beverages for 24 hours. If you had a esophageal dilation, please see attached instructions for diet.    ACTIVITY: Your care partner should take you home directly after the procedure. You should plan to take it easy, moving slowly for the rest of the day. You can resume normal activity the day after the procedure however YOU SHOULD NOT DRIVE, use power tools, machinery or perform tasks that involve climbing or major physical exertion for 24 hours (because of the sedation medicines used during the test).   SYMPTOMS TO REPORT IMMEDIATELY: A gastroenterologist can be reached at any hour. Please call [phone]  for any of the following symptoms:    Following lower endoscopy (colonoscopy, flexible sigmoidoscopy) Excessive amounts of blood in the stool  Significant tenderness, worsening of abdominal pains  Swelling of the abdomen that is new, acute  Fever of 100 or higher  Following upper endoscopy (EGD, EUS, ERCP, esophageal dilation) Vomiting of blood or coffee ground material  New, significant abdominal pain  New, significant chest pain or pain under the shoulder blades  Painful or persistently difficult swallowing  New shortness of breath  Black, tarry-looking or red, bloody stools  FOLLOW UP:  If any biopsies were taken you will be contacted by phone or by letter within the next 1-3 weeks. Call [phone]  if you have not heard about the biopsies in 3 weeks.  Please also call with any specific questions about appointments or follow up tests.

## 2016-03-28 NOTE — Interval H&P Note (Signed)
History and Physical Interval Note:  03/28/2016 1:11 PM  [name]  has presented today for surgery, with the diagnosis of diarrhea/has hx of seizures  The various methods of treatment have been discussed with the patient and family. After consideration of risks, benefits and other options for treatment, the patient has consented to  Procedure(s): COLONOSCOPY WITH PROPOFOL (N/A) as a surgical intervention .  The patient's history has been reviewed, patient examined, no change in status, stable for surgery.  I have reviewed the patient's chart and labs.  Questions were answered to the patient's satisfaction.     Silvano Rusk

## 2016-03-28 NOTE — Transfer of Care (Signed)
Immediate Anesthesia Transfer of Care Note  Patient: [name]  Procedure(s) Performed: Procedure(s): COLONOSCOPY WITH PROPOFOL (N/A)  Patient Location: PACU  Anesthesia Type:mac   Level of Consciousness: awake, alert , oriented and patient cooperative  Airway & Oxygen Therapy: Patient Spontanous Breathing and Patient connected to face mask oxygen  Post-op Assessment: Report given to RN and Post -op Vital signs reviewed and stable  Post vital signs: Reviewed and stable  Last Vitals:  Vitals:   03/28/16 1132  BP: (!) 187/71  Pulse: 73  Resp: 20  Temp: 36.4 C    Last Pain:  Vitals:   03/28/16 1132  TempSrc: Oral         Complications: No apparent anesthesia complications

## 2016-04-03 ENCOUNTER — Ambulatory Visit (INDEPENDENT_AMBULATORY_CARE_PROVIDER_SITE_OTHER): Payer: Medicare Other | Admitting: *Deleted

## 2016-04-03 DIAGNOSIS — R002 Palpitations: Secondary | ICD-10-CM | POA: Diagnosis not present

## 2016-04-03 NOTE — Progress Notes (Signed)
Carelink Summary Report / Loop Recorder 

## 2016-04-06 ENCOUNTER — Encounter: Payer: Self-pay | Admitting: Cardiology

## 2016-04-07 ENCOUNTER — Other Ambulatory Visit: Payer: Self-pay | Admitting: Family

## 2016-04-12 ENCOUNTER — Ambulatory Visit (INDEPENDENT_AMBULATORY_CARE_PROVIDER_SITE_OTHER): Payer: Medicare Other | Admitting: Family

## 2016-04-12 ENCOUNTER — Encounter: Payer: Self-pay | Admitting: Family

## 2016-04-12 VITALS — BP 135/69 | HR 93 | Temp 97.1°F

## 2016-04-12 DIAGNOSIS — F32A Depression, unspecified: Secondary | ICD-10-CM

## 2016-04-12 DIAGNOSIS — K219 Gastro-esophageal reflux disease without esophagitis: Secondary | ICD-10-CM

## 2016-04-12 DIAGNOSIS — R609 Edema, unspecified: Secondary | ICD-10-CM

## 2016-04-12 DIAGNOSIS — I10 Essential (primary) hypertension: Secondary | ICD-10-CM

## 2016-04-12 DIAGNOSIS — J309 Allergic rhinitis, unspecified: Secondary | ICD-10-CM | POA: Diagnosis not present

## 2016-04-12 DIAGNOSIS — F329 Major depressive disorder, single episode, unspecified: Secondary | ICD-10-CM

## 2016-04-12 DIAGNOSIS — G40109 Localization-related (focal) (partial) symptomatic epilepsy and epileptic syndromes with simple partial seizures, not intractable, without status epilepticus: Secondary | ICD-10-CM | POA: Diagnosis not present

## 2016-04-12 DIAGNOSIS — G4700 Insomnia, unspecified: Secondary | ICD-10-CM

## 2016-04-12 DIAGNOSIS — I480 Paroxysmal atrial fibrillation: Secondary | ICD-10-CM | POA: Diagnosis not present

## 2016-04-12 DIAGNOSIS — B370 Candidal stomatitis: Secondary | ICD-10-CM

## 2016-04-12 DIAGNOSIS — Z1239 Encounter for other screening for malignant neoplasm of breast: Secondary | ICD-10-CM

## 2016-04-12 DIAGNOSIS — F411 Generalized anxiety disorder: Secondary | ICD-10-CM

## 2016-04-12 DIAGNOSIS — E038 Other specified hypothyroidism: Secondary | ICD-10-CM | POA: Diagnosis not present

## 2016-04-12 DIAGNOSIS — E785 Hyperlipidemia, unspecified: Secondary | ICD-10-CM | POA: Diagnosis not present

## 2016-04-12 MED ORDER — NYSTATIN 100000 UNIT/ML MT SUSP: 5.0000 mL | Freq: Four times a day (QID) | OROMUCOSAL | 0 refills | Status: DC

## 2016-04-12 MED ORDER — BENZONATATE 100 MG PO CAPS: 100.0000 mg | ORAL_CAPSULE | Freq: Two times a day (BID) | ORAL | 0 refills | Status: DC | PRN

## 2016-04-12 MED ORDER — TRAZODONE HCL 50 MG PO TABS: 25.0000 mg | ORAL_TABLET | Freq: Every evening | ORAL | 3 refills | Status: DC | PRN

## 2016-04-12 NOTE — Patient Instructions (Signed)
Thrush, Adult  Thrush, also called oral candidiasis, is a fungal infection that develops in the mouth and throat and on the tongue. It causes white patches to form on the mouth and tongue. Thrush is most common in older adults, but it can occur at any age.   Many cases of thrush are mild, but this infection can also be more serious. Thrush can be a recurring problem for people who have chronic illnesses or who take medicines that limit the body's ability to fight infection. Because these people have difficulty fighting infections, the fungus that causes thrush can spread throughout the body. This can cause life-threatening blood or organ infections.  CAUSES   Thrush is usually caused by a yeast called Candida albicans. This fungus is normally present in small amounts in the mouth and on other mucous membranes. It usually causes no harm. However, when conditions are present that allow the fungus to grow uncontrolled, it invades surrounding tissues and becomes an infection. Less often, other Candida species can also lead to thrush.   RISK FACTORS  Thrush is more likely to develop in the following people:  · People with an impaired ability to fight infection (weakened immune system).    · Older adults.    · People with HIV.    · People with diabetes.    · People with dry mouth (xerostomia).    · Pregnant women.    · People with poor dental care, especially those who have false teeth.    · People who use antibiotic medicines.    SIGNS AND SYMPTOMS   Thrush can be a mild infection that causes no symptoms. If symptoms develop, they may include:   · A burning feeling in the mouth and throat. This can occur at the start of a thrush infection.    · White patches that adhere to the mouth and tongue. The tissue around the patches may be red, raw, and painful. If rubbed (during tooth brushing, for example), the patches and the tissue of the mouth may bleed easily.    · A bad taste in the mouth or difficulty tasting foods.     · Cottony feeling in the mouth.    · Pain during eating and swallowing.  DIAGNOSIS   Your health care provider can usually diagnose thrush by looking in your mouth and asking you questions about your health.   TREATMENT   Medicines that help prevent the growth of fungi (antifungals) are the standard treatment for thrush. These medicines are either applied directly to the affected area (topical) or swallowed (oral). The treatment will depend on the severity of the condition.   Mild Thrush  Mild cases of thrush may clear up with the use of an antifungal mouth rinse or lozenges. Treatment usually lasts about 14 days.   Moderate to Severe Thrush  · More severe thrush infections that have spread to the esophagus are treated with an oral antifungal medicine. A topical antifungal medicine may also be used.    · For some severe infections, a treatment period longer than 14 days may be needed.    · Oral antifungal medicines are almost never used during pregnancy because the fetus may be harmed. However, if a pregnant woman has a rare, severe thrush infection that has spread to her blood, oral antifungal medicines may be used. In this case, the risk of harm to the mother and fetus from the severe thrush infection may be greater than the risk posed by the use of antifungal medicines.    Persistent or Recurrent Thrush  For cases of   thrush that do not go away or keep coming back, treatment may involve the following:   · Treatment may be needed twice as long as the symptoms last.    · Treatment will include both oral and topical antifungal medicines.    · People with weakened immune systems can take an antifungal medicine on a continuous basis to prevent thrush infections.    It is important to treat conditions that make you more likely to get thrush, such as diabetes or HIV.   HOME CARE INSTRUCTIONS   · Only take over-the-counter or prescription medicine as directed by your health care provider. Talk to your health care  provider about an over-the-counter medicine called gentian violet, which kills bacteria and fungi.    · Eat plain, unflavored yogurt as directed by your health care provider. Check the label to make sure the yogurt contains live cultures. This yogurt can help healthy bacteria grow in the mouth that can stop the growth of the fungus that causes thrush.    · Try these measures to help reduce the discomfort of thrush:      Drink cold liquids such as water or iced tea.      Try flavored ice treats or frozen juices.      Eat foods that are easy to swallow, such as gelatin, ice cream, or custard.      If the patches in your mouth are painful, try drinking from a straw.    · Rinse your mouth several times a day with a warm saltwater rinse. You can make the saltwater mixture with 1 tsp (6 g) of salt in 8 fl oz (0.2 L) of warm water.    · If you wear dentures, remove the dentures before going to bed, brush them vigorously, and soak them in a cleaning solution as directed by your health care provider.    · Women who are breastfeeding should clean their nipples with an antifungal medicine as directed by their health care provider. Dry the nipples after breastfeeding. Applying lanolin-containing body lotion may help relieve nipple soreness.    SEEK MEDICAL CARE IF:  · Your symptoms are getting worse or are not improving within 7 days of starting treatment.    · You have symptoms of spreading infection, such as white patches on the skin outside of the mouth.    · You are nursing and you have redness, burning, or pain in the nipples that is not relieved with treatment.    MAKE SURE YOU:  · Understand these instructions.  · Will watch your condition.  · Will get help right away if you are not doing well or get worse.     This information is not intended to replace advice given to you by your health care provider. Make sure you discuss any questions you have with your health care provider.     Document Released: 04/10/2004 Document  Revised: 08/06/2014 Document Reviewed: 02/16/2013  Elsevier Interactive Patient Education ©2016 [employer]

## 2016-04-12 NOTE — Progress Notes (Signed)
Subjective:    Patient ID: [name], female    DOB: [birth year], 82 y.o.   MRN: [id number]  PT presents to the office for chronic follow up. Pt is followed by two Cardiologists (Dr. Rayann Heman & Dr. Gwenlyn Found) for atrial fib and bradycardia. PT is followed by neurology once a year for seizures. Pt has appt with Urologists 04/27/16 for chronic UTI's. PT states she has taken "several antibiotics" for UTI and is now complaining of oral thrush. Pt states she has used nystatin in the past that worked well.  Hyperlipidemia  This is a chronic problem. The current episode started more than 1 year ago. The problem is uncontrolled. Recent lipid tests were reviewed and are high. Exacerbating diseases include obesity. Pertinent negatives include no shortness of breath. Current antihyperlipidemic treatment includes statins. Risk factors for coronary artery disease include diabetes mellitus, dyslipidemia, hypertension, obesity, post-menopausal and a sedentary lifestyle.  Hypertension  This is a chronic problem. The current episode started more than 1 year ago. The problem has been resolved since onset. The problem is controlled. Associated symptoms include anxiety, malaise/fatigue and palpitations. Pertinent negatives include no headaches, peripheral edema or shortness of breath. Risk factors for coronary artery disease include dyslipidemia, obesity, post-menopausal state, sedentary lifestyle and family history. Past treatments include angiotensin blockers. The current treatment provides moderate improvement. Hypertensive end-organ damage includes CVA and a thyroid problem. There is no history of kidney disease, CAD/MI or heart failure.  Depression       The patient presents with depression.  This is a chronic problem.  The current episode started more than 1 year ago.   The onset quality is gradual.   The problem occurs intermittently.  The problem has been waxing and waning since onset.  Associated symptoms include  insomnia and sad.  Associated symptoms include no fatigue, no helplessness, no hopelessness, no restlessness, no headaches and no suicidal ideas.     The symptoms are aggravated by family issues.  Past treatments include SSRIs - Selective serotonin reuptake inhibitors.  Compliance with treatment is good.  Past medical history includes thyroid problem, anxiety and depression.   Anxiety  Presents for follow-up visit. Onset was more than 5 years ago. The problem has been waxing and waning. Symptoms include depressed mood, dry mouth, excessive worry, insomnia, nervous/anxious behavior and palpitations. Patient reports no irritability, restlessness, shortness of breath or suicidal ideas. Symptoms occur occasionally. The symptoms are aggravated by family issues. The quality of sleep is good.   Her past medical history is significant for anxiety/panic attacks and depression. Past treatments include SSRIs.  Thyroid Problem  Presents for follow-up visit. Symptoms include anxiety, depressed mood, diarrhea and palpitations. Patient reports no constipation, fatigue or hoarse voice. The symptoms have been stable. Past treatments include levothyroxine. The treatment provided moderate relief. Her past medical history is significant for hyperlipidemia. There is no history of heart failure.  Gastroesophageal Reflux  She complains of coughing (Intermittent). She reports no belching, no heartburn or no hoarse voice. This is a chronic problem. The current episode started more than 1 year ago. The problem occurs rarely. The problem has been waxing and waning. The symptoms are aggravated by certain foods. Pertinent negatives include no fatigue. Risk factors include obesity. She has tried a PPI for the symptoms. The treatment provided significant relief.  Seizures   This is a chronic (PT is followed by neurology every year) problem. Associated symptoms include cough (Intermittent) and diarrhea. Pertinent negatives include no  headaches. Characteristics do  not include bladder incontinence.  Insomnia  Primary symptoms: difficulty falling asleep, frequent awakening, malaise/fatigue.  The current episode started more than one year. The onset quality is gradual. The problem occurs every several days. The problem has been waxing and waning since onset. The symptoms are aggravated by anxiety. PMH includes: depression.  Peripheral Edema PT has lasix to take as needed. PT states she has not taken in "months".     Review of Systems  Constitutional: Positive for malaise/fatigue. Negative for fatigue and irritability.  HENT: Negative for hoarse voice.   Eyes: Negative.   Respiratory: Positive for cough (Intermittent). Negative for shortness of breath.   Cardiovascular: Positive for palpitations.  Gastrointestinal: Positive for diarrhea. Negative for constipation and heartburn.  Endocrine: Negative.   Genitourinary: Negative.  Negative for bladder incontinence.  Musculoskeletal: Negative.   Neurological: Positive for seizures. Negative for headaches.  Hematological: Negative.   Psychiatric/Behavioral: Positive for depression. Negative for suicidal ideas. The patient is nervous/anxious and has insomnia.   All other systems reviewed and are negative.      Objective:   Physical Exam  Constitutional: She is oriented to person, place, and time. She appears well-developed and well-nourished. No distress.  HENT:  Head: Normocephalic and atraumatic.  Right Ear: External ear normal.  Left Ear: External ear normal.  Nose: Nose normal.  Mouth/Throat: Oropharynx is clear and moist.  Eyes: Pupils are equal, round, and reactive to light.  Neck: Normal range of motion. Neck supple. No thyromegaly present.  Cardiovascular: Normal rate, regular rhythm, normal heart sounds and intact distal pulses.   No murmur heard. Pulmonary/Chest: Effort normal and breath sounds normal. No respiratory distress. She has no wheezes.  Abdominal:  Soft. Bowel sounds are normal. She exhibits no distension. There is no tenderness.  Musculoskeletal: She exhibits no edema or tenderness.  Pt in wheelchair   Neurological: She is alert and oriented to person, place, and time. She has normal reflexes. No cranial nerve deficit.  Skin: Skin is warm and dry.  Psychiatric: She has a normal mood and affect. Her behavior is normal. Judgment and thought content normal.  Vitals reviewed.     BP 135/69   Pulse 93   Temp 97.1 F (36.2 C) (Oral)      Assessment & Plan:  1. Essential hypertension - CMP14+EGFR  2. Paroxysmal atrial fibrillation ([name]) - CMP14+EGFR  3. Allergic rhinitis, unspecified allergic rhinitis type - CMP14+EGFR  4. Gastroesophageal reflux disease, esophagitis presence not specified - CMP14+EGFR  5. Other specified hypothyroidism - CMP14+EGFR  6. Localization-related partial epilepsy with simple partial seizures ([name]) - CMP14+EGFR  7. Depression - CMP14+EGFR  8. GAD (generalized anxiety disorder) - CMP14+EGFR  9. Hyperlipemia - CMP14+EGFR  10. Insomnia - CMP14+EGFR - traZODone (DESYREL) 50 MG tablet; Take 0.5-1 tablets (25-50 mg total) by mouth at bedtime as needed for sleep.  Dispense: 30 tablet; Refill: 3  11. Morbid obesity, unspecified obesity type ([place]) - CMP14+EGFR  12. Peripheral edema - CMP14+EGFR  13. Oral thrush -Gargle - CMP14+EGFR - nystatin (MYCOSTATIN) 100000 UNIT/ML suspension; Take 5 mLs (500,000 Units total) by mouth 4 (four) times daily.  Dispense: 60 mL; Refill: 0  14. Breast cancer screening - [place] DIGITAL SCREENING BILATERAL; Future   Continue all meds Labs pending Health Maintenance reviewed Diet and exercise encouraged RTO 3 months  Evelina Dun, FNP

## 2016-04-13 LAB — CMP14+EGFR
A/G RATIO: 1.6 (ref 1.2–2.2)
ALBUMIN: 3.9 g/dL (ref 3.5–4.7)
ALT: 9 IU/L (ref 0–32)
AST: 16 IU/L (ref 0–40)
Alkaline Phosphatase: 64 IU/L (ref 39–117)
BILIRUBIN TOTAL: 0.4 mg/dL (ref 0.0–1.2)
BUN / CREAT RATIO: 21 (ref 12–28)
BUN: 15 mg/dL (ref 8–27)
CALCIUM: 9.4 mg/dL (ref 8.7–10.3)
CHLORIDE: 97 mmol/L (ref 96–106)
CO2: 24 mmol/L (ref 18–29)
Creatinine, Ser: 0.73 mg/dL (ref 0.57–1.00)
GFR, EST AFRICAN AMERICAN: 90 mL/min/{1.73_m2} (ref >59)
GFR, EST NON AFRICAN AMERICAN: 78 mL/min/{1.73_m2} (ref >59)
GLOBULIN, TOTAL: 2.5 g/dL (ref 1.5–4.5)
Glucose: 97 mg/dL (ref 65–99)
POTASSIUM: 4.8 mmol/L (ref 3.5–5.2)
SODIUM: 136 mmol/L (ref 134–144)
TOTAL PROTEIN: 6.4 g/dL (ref 6.0–8.5)

## 2016-04-16 ENCOUNTER — Telehealth: Payer: Self-pay | Admitting: Family

## 2016-04-16 MED ORDER — ZOLPIDEM TARTRATE 5 MG PO TABS: 5.0000 mg | ORAL_TABLET | Freq: Every evening | ORAL | 3 refills | Status: DC | PRN

## 2016-04-16 NOTE — Telephone Encounter (Signed)
Medication sent to pharmacy  

## 2016-04-16 NOTE — Telephone Encounter (Signed)
Please call rx in. Thanks

## 2016-04-23 LAB — CUP PACEART REMOTE DEVICE CHECK: Date Time Interrogation Session: 20170902150752

## 2016-04-27 DIAGNOSIS — R339 Retention of urine, unspecified: Secondary | ICD-10-CM | POA: Diagnosis not present

## 2016-04-30 ENCOUNTER — Ambulatory Visit (INDEPENDENT_AMBULATORY_CARE_PROVIDER_SITE_OTHER): Payer: Medicare Other | Admitting: *Deleted

## 2016-04-30 DIAGNOSIS — R002 Palpitations: Secondary | ICD-10-CM

## 2016-04-30 NOTE — Progress Notes (Signed)
Carelink Summary Report / Loop Recorder 

## 2016-05-01 ENCOUNTER — Ambulatory Visit (INDEPENDENT_AMBULATORY_CARE_PROVIDER_SITE_OTHER): Payer: Medicare Other

## 2016-05-01 DIAGNOSIS — Z23 Encounter for immunization: Secondary | ICD-10-CM

## 2016-05-22 ENCOUNTER — Ambulatory Visit (INDEPENDENT_AMBULATORY_CARE_PROVIDER_SITE_OTHER): Payer: Medicare Other | Admitting: Cardiovascular Disease

## 2016-05-22 ENCOUNTER — Encounter: Payer: Self-pay | Admitting: Cardiovascular Disease

## 2016-05-22 VITALS — BP 130/68 | HR 68 | Ht 64.0 in | Wt 220.0 lb

## 2016-05-22 DIAGNOSIS — I10 Essential (primary) hypertension: Secondary | ICD-10-CM | POA: Diagnosis not present

## 2016-05-22 DIAGNOSIS — E7800 Pure hypercholesterolemia, unspecified: Secondary | ICD-10-CM | POA: Diagnosis not present

## 2016-05-22 DIAGNOSIS — I639 Cerebral infarction, unspecified: Secondary | ICD-10-CM | POA: Diagnosis not present

## 2016-05-22 DIAGNOSIS — I480 Paroxysmal atrial fibrillation: Secondary | ICD-10-CM

## 2016-05-22 NOTE — Assessment & Plan Note (Signed)
History of hypertension blood pressure measured 130/68. She is on amlodipine and losartan. Continue current meds at current dosing.

## 2016-05-22 NOTE — Patient Instructions (Signed)
Medications:  Your physician recommends that you continue on your current medications as directed. Please refer to the Current Medication list given to you today.   Follow-Up:  Your physician wants you to follow-up in: 12 months with Dr. Gwenlyn Found. You will receive a reminder letter in the mail two months in advance. If you don't receive a letter, please call our office to schedule the follow-up appointment.  If you need a refill on your cardiac medications before your next appointment, please call your pharmacy.

## 2016-05-22 NOTE — Assessment & Plan Note (Signed)
History of paroxysmal atrial fibrillation maintaining sinus rhythm thought not to be a good anticoagulation candidate because of fall risk.

## 2016-05-22 NOTE — Assessment & Plan Note (Signed)
History of hyperlipidemia on statin therapy with recent lipid profile performed 09/30/15 revealed an LDL of 111.

## 2016-05-22 NOTE — Progress Notes (Signed)
05/22/2016 [name]   January 03, 1934  [id number]  Primary Physician Evelina Dun, FNP Primary Cardiologist: Lorretta Harp MD Renae Gloss  HPI:  [name] is a 82 year old moderately overweight married Caucasian female mother of 10 (son 14 years old died 57/2 years ago prostate CA), grandmother of 2 grandchildren, who was formally a patient of Dr. Georgiann Mccoy. I last saw her in the office 05/17/59. She has no prior cardiac history. I last saw her in the office 11/10/14. She does have a history of treated hypertension and hyperlipidemia as well as a family history of heart disease. Her father died at age 48 from a heart attack as did her brother. She has another brother who is in open-heart surgery. She has never had a heart attack but has had "mini strokes" in the past. She does get dyspnea on exertion but denies chest pain. I left cell her one year ago. She apparently had Ace sternal fracture secondary to a fall 6 months ago and had a chest CT that showed abnormalities in her lung parenchyma which has been followed by serial CT scans. She was recently admitted to, hospital for chest pain rule out MI. CT angiogram was negative for PE. A Myoview stress test was negative as well. She complains of episodes of weakness as well as palpitations. She does have blood pressure and heart rate readings with some relative bradycardia. Because of palpitations and weakness and performed a 30 day event monitor which showed predominantly sinus rhythm with episodes of sinus bradycardia, pauses up to 2 seconds and a short run of paroxysmal fibrillation with a rapid ventricular response. She is not a good oral anticoagulation patient because of frequent falls and AVMs.   Current Outpatient Prescriptions  Medication Sig Dispense Refill  . amitriptyline (ELAVIL) 50 MG tablet Take 0.5 tablets (25 mg total) by mouth at bedtime. 90 tablet 2  . amLODipine (NORVASC) 5 MG tablet TAKE 1 TABLET DAILY 90  tablet 0  . benzonatate (TESSALON) 100 MG capsule Take 1 capsule (100 mg total) by mouth 2 (two) times daily as needed for cough. 20 capsule 0  . escitalopram (LEXAPRO) 20 MG tablet Take 1 tablet (20 mg total) by mouth at bedtime. 90 tablet 2  . furosemide (LASIX) 40 MG tablet TAKE 1 TABLET DAILY AS NEEDED FOR FLUID 90 tablet 0  . gabapentin (NEURONTIN) 300 MG capsule Take 1 capsule (300 mg total) by mouth 3 (three) times daily. 270 capsule 3  . levothyroxine (SYNTHROID, LEVOTHROID) 75 MCG tablet Take 1 tablet (75 mcg total) by mouth daily before breakfast. 90 tablet 2  . losartan (COZAAR) 50 MG tablet Take 1 tablet (50 mg total) by mouth daily. (Patient taking differently: Take 50 mg by mouth at bedtime. ) 90 tablet 2  . lovastatin (MEVACOR) 40 MG tablet Take 1 tablet (40 mg total) by mouth at bedtime. 90 tablet 3  . nystatin (MYCOSTATIN) 100000 UNIT/ML suspension Take 5 mLs (500,000 Units total) by mouth 4 (four) times daily. 60 mL 0  . omeprazole (PRILOSEC) 40 MG capsule Take 1 capsule (40 mg total) by mouth daily. 90 capsule 3  . vitamin B-12 (CYANOCOBALAMIN) 1000 MCG tablet Take 1,000 mcg by mouth every 14 (fourteen) days.      No current facility-administered medications for this visit.     Allergies  Allergen Reactions  . Lisinopril Cough    Social History   Social History  . Marital status: Married    Spouse  name: N/A  . Number of children: N/A  . Years of education: N/A   Occupational History  . Not on file.   Social History Main Topics  . Smoking status: Former Smoker    Packs/day: 0.25    Years: 10.00    Types: Cigarettes    Quit date: 07/31/1975  . Smokeless tobacco: Never Used  . Alcohol use No     Comment: Rare- maybe a drink ever 2 years  . Drug use: No  . Sexual activity: Not on file   Other Topics Concern  . Not on file   Social History Narrative   Lives with husband, does have stairs, does not use them. Pt completed 10th grade.     Review of  Systems: General: negative for chills, fever, night sweats or weight changes.  Cardiovascular: negative for chest pain, dyspnea on exertion, edema, orthopnea, palpitations, paroxysmal nocturnal dyspnea or shortness of breath Dermatological: negative for rash Respiratory: negative for cough or wheezing Urologic: negative for hematuria Abdominal: negative for nausea, vomiting, diarrhea, bright red blood per rectum, melena, or hematemesis Neurologic: negative for visual changes, syncope, or dizziness All other systems reviewed and are otherwise negative except as noted above.    Blood pressure 130/68, pulse 68, height '5\' 4"'$  (1.626 m), weight 220 lb (99.8 kg).  General appearance: alert and no distress Neck: no adenopathy, no carotid bruit, no JVD, supple, symmetrical, trachea midline and thyroid not enlarged, symmetric, no tenderness/mass/nodules Lungs: clear to auscultation bilaterally Heart: regular rate and rhythm, S1, S2 normal, no murmur, click, rub or gallop Extremities: extremities normal, atraumatic, no cyanosis or edema  EKG normal sinus rhythm at 68 without ST or T-wave changes. I personally reviewed this EKG  ASSESSMENT AND PLAN:   Hyperlipemia History of hyperlipidemia on statin therapy with recent lipid profile performed 09/30/15 revealed an LDL of 111.  Essential hypertension History of hypertension blood pressure measured 130/68. She is on amlodipine and losartan. Continue current meds at current dosing.  Paroxysmal atrial fibrillation ([name]) History of paroxysmal atrial fibrillation maintaining sinus rhythm thought not to be a good anticoagulation candidate because of fall risk.      Lorretta Harp MD FACP,FACC,FAHA, [facility] 05/22/2016 3:04 PM

## 2016-05-30 ENCOUNTER — Ambulatory Visit (INDEPENDENT_AMBULATORY_CARE_PROVIDER_SITE_OTHER): Payer: Medicare Other | Admitting: *Deleted

## 2016-05-30 DIAGNOSIS — R002 Palpitations: Secondary | ICD-10-CM | POA: Diagnosis not present

## 2016-05-30 NOTE — Progress Notes (Signed)
Carelink Summary Report / Loop Recorder 

## 2016-06-06 ENCOUNTER — Encounter: Payer: Self-pay | Admitting: Pediatrics

## 2016-06-06 ENCOUNTER — Ambulatory Visit (INDEPENDENT_AMBULATORY_CARE_PROVIDER_SITE_OTHER): Payer: Medicare Other | Admitting: Pediatrics

## 2016-06-06 VITALS — BP 139/73 | HR 67 | Temp 97.7°F | Ht 64.0 in

## 2016-06-06 DIAGNOSIS — G319 Degenerative disease of nervous system, unspecified: Secondary | ICD-10-CM

## 2016-06-06 DIAGNOSIS — G4700 Insomnia, unspecified: Secondary | ICD-10-CM | POA: Diagnosis not present

## 2016-06-06 DIAGNOSIS — I10 Essential (primary) hypertension: Secondary | ICD-10-CM | POA: Diagnosis not present

## 2016-06-06 DIAGNOSIS — I6930 Unspecified sequelae of cerebral infarction: Secondary | ICD-10-CM

## 2016-06-06 MED ORDER — ALPRAZOLAM 0.25 MG PO TABS: 0.2500 mg | ORAL_TABLET | Freq: Every evening | ORAL | 2 refills | Status: DC | PRN

## 2016-06-06 NOTE — Progress Notes (Signed)
  Subjective:   Patient ID: Arianny Pun, female    DOB: [birth year], 82 y.o.   MRN: [id number] CC: Follow-up (3 month) multiple med problems  HPI: Nishka Heide is a 82 y.o. female presenting for Follow-up (3 month)  Has been feeling well Chronic UTIs, seen by urology Started on ppx abx, she isnt sure which one, has not had UTI since then  HTN: No CP, SOB, HA Takes medication regularly  Insomnia: has been tried on trazodone, she isnt sure what it did but it doesn't help and she thinks it caused problems, has been tested for sleep apnea, was negative. Tried on [place], she thinks was far too strong. Wakes up frequently throughout the night, has a hard time falling asleep Tries reading before sleep, someitmeshas to read for a couple of hours  Spinocerebellar ataxia: uses assist devices, not able to use walker now, primarily in wheelchair due to balance problems  Relevant past medical, surgical, family and social history reviewed. Allergies and medications reviewed and updated. History  Smoking Status  . Former Smoker  . Packs/day: 0.25  . Years: 10.00  . Types: Cigarettes  . Quit date: 07/31/1975  Smokeless Tobacco  . Never Used   ROS: Per HPI   Objective:    BP 139/73   Pulse 67   Temp 97.7 F (36.5 C) (Oral)   Ht '5\' 4"'$  (1.626 m)   Wt Readings from Last 3 Encounters:  05/22/16 220 lb (99.8 kg)  10/25/15 230 lb (104.3 kg)  09/30/15 230 lb (104.3 kg)    Gen: NAD, alert, cooperative with exam, NCAT EYES: EOMI, no conjunctival injection, or no icterus CV: NRRR, normal S1/S2 Resp: CTABL, no wheezes, normal WOB Abd: +BS, soft, NTND. no guarding or organomegaly Ext: No edema, warm Neuro: Alert and oriented MSK: normal muscle bulk  Assessment & Plan:  Onedia was seen today for insomnia, follow-up.  Diagnoses and all orders for this visit:  Insomnia, unspecified type Pt has tried multiple medications Will try melatonin Says xanax has helped her with sleep in the past  without causing excessive sleepiness Discussed concerns with this medicaiton, pt aware can make her sleepy, can increase falls Is primarily in wheelchair now as she was so unsteady with walker Gave Rx for #12 tabs, 2 refills Needs to be seen for further refills Should not take every night to prevent dependence, 2-3 times a week Pt in agreement with plan -     ALPRAZolam (XANAX) 0.25 MG tablet; Take 1 tablet (0.25 mg total) by mouth at bedtime as needed (or insomnia).  Essential hypertension Well controlled, cont current meds  Cerebellar degeneration Follows with neurology  Follow up plan: Return in about 3 months (around 09/06/2016) for med follow up. Assunta Found, MD [place]

## 2016-06-06 NOTE — Patient Instructions (Signed)
Melatonin

## 2016-06-09 LAB — CUP PACEART REMOTE DEVICE CHECK
Implantable Pulse Generator Implant Date: 20160609
MDC IDC SESS DTM: 20171002150943

## 2016-06-09 NOTE — Progress Notes (Signed)
Carelink summary report received. Battery status OK. Normal device function. No new symptom episodes, tachy episodes, brady, or pause episodes. No new AF episodes. Monthly summary reports and ROV/PRN 

## 2016-06-25 ENCOUNTER — Encounter: Payer: Self-pay | Admitting: Internal Medicine

## 2016-06-25 ENCOUNTER — Ambulatory Visit (INDEPENDENT_AMBULATORY_CARE_PROVIDER_SITE_OTHER): Payer: Medicare Other | Admitting: Internal Medicine

## 2016-06-25 VITALS — BP 122/78 | HR 77 | Ht 64.0 in

## 2016-06-25 DIAGNOSIS — I639 Cerebral infarction, unspecified: Secondary | ICD-10-CM

## 2016-06-25 DIAGNOSIS — I480 Paroxysmal atrial fibrillation: Secondary | ICD-10-CM | POA: Diagnosis not present

## 2016-06-25 DIAGNOSIS — R001 Bradycardia, unspecified: Secondary | ICD-10-CM

## 2016-06-25 LAB — CUP PACEART INCLINIC DEVICE CHECK
Implantable Pulse Generator Implant Date: 20160609
MDC IDC SESS DTM: 20171127161321

## 2016-06-25 NOTE — Patient Instructions (Signed)

## 2016-06-25 NOTE — Progress Notes (Signed)
Electrophysiology Office Note   Date:  06/25/2016   ID:  [name], DOB 1934-06-23, MRN [id number]  PCP:  Evelina Dun, FNP  Cardiologist:  Dr Gwenlyn Found Primary Electrophysiologist: Thompson Grayer, MD    Chief Complaint  Patient presents with  . Atrial Fibrillation     History of Present Illness: [name] is a 82 y.o. female who presents today for electrophysiology follow-up.   She has a longstanding history of multiple chronic medical issues.  She is not active.  Her primary concern is with progressive cerebellar disease and unsteadiness from this.  She has done well since her ILR implant and has had no further episodes of dizziness or presyncope.  Today, she denies symptoms of chest pain, shortness of breath, orthopnea, PND, claudication,  bleeding, or neurologic sequela. The patient is tolerating medications without difficulties and is otherwise without complaint today.    Past Medical History:  Diagnosis Date  . Acute respiratory failure with hypoxia ([place]) 10/23/2015  . Allergic rhinitis    PT. DENIES  . Anxiety   . Arthritis    NECK  . Ataxia   . Bradycardia    primarily nocturnal  . Burning tongue syndrome 25 years  . Cataract   . Cerebellar degeneration   . Chronic pericarditis   . Chronic urinary tract infection   . Complication of anesthesia    low o2 sats, coded 30 years ago  . CVA (cerebral infarction) 05/2003  . Depression   . Gait disorder   . Gastric polyps   . GERD (gastroesophageal reflux disease)   . High cholesterol   . Hyperlipidemia   . Hypertension   . Hypertension   . Hypotension   . Hypothyroidism   . IBS (irritable bowel syndrome)   . Obesity   . Paroxysmal atrial fibrillation ([name])    chads2vasc score is 6,  she is felt to be a poor candidate for anticoagulation  . Personal history of arterial venous malformation (AVM)    right side of face  . Seizure disorder ([facility])   . Seizures ([place]) 2003   " smelling"- Gabapentin "no problem"   . Shortness of breath dyspnea    with exertion  . Sternum fx 10/27/2013  . Stroke [facility]) 5 years ago   Right side of face weak, slurred speach-   . Thyroid disease   . TIA (transient ischemic attack)   . UTI (lower urinary tract infection) 03/27/2016   "frequently"   Past Surgical History:  Procedure Laterality Date  . APPENDECTOMY  82 years old  . COLONOSCOPY  2006, 2009  . COLONOSCOPY WITH PROPOFOL N/A 03/28/2016   Procedure: COLONOSCOPY WITH PROPOFOL;  Surgeon: Gatha Mayer, MD;  Location: [facility];  Service: Endoscopy;  Laterality: N/A;  . cyst removed  35 years ago  . EP IMPLANTABLE DEVICE N/A 01/06/2015   Procedure: Loop Recorder Insertion;  Surgeon: Thompson Grayer, MD;  Location: [facility];  Service: Cardiovascular;  Laterality: N/A;  . EYE SURGERY Right    Cataract  . KNEE ARTHROSCOPY Right 11/14/2006  . KNEE ARTHROSCOPY Bilateral 5 and 6 years ago  . KNEE ARTHROSCOPY WITH LATERAL MENISECTOMY  07/03/2012   Procedure: KNEE ARTHROSCOPY WITH LATERAL MENISECTOMY;  Surgeon: Magnus Sinning, MD;  Location: [facility];  Service: Orthopedics;  Laterality: Left;  with Partial Lateral Menisectomy and Medial Menisectomy. Shaving of medial and lateral femoral condyles. Shaving of patella. Removal of a loose body  . tibial and fibular internal fixation Left   .  TOTAL ABDOMINAL HYSTERECTOMY  82 years old  . UPPER GASTROINTESTINAL ENDOSCOPY  2009, 2013     Current Outpatient Prescriptions  Medication Sig Dispense Refill  . ALPRAZolam (XANAX) 0.25 MG tablet Take 1 tablet (0.25 mg total) by mouth at bedtime as needed (or insomnia). 12 tablet 2  . amitriptyline (ELAVIL) 50 MG tablet Take 0.5 tablets (25 mg total) by mouth at bedtime. 90 tablet 2  . benzonatate (TESSALON) 100 MG capsule Take 1 capsule (100 mg total) by mouth 2 (two) times daily as needed for cough. 20 capsule 0  . escitalopram (LEXAPRO) 20 MG tablet Take 1 tablet (20 mg total) by mouth at bedtime. 90 tablet 2  .  furosemide (LASIX) 40 MG tablet Take 40 mg by mouth daily as needed for fluid.    Marland Kitchen gabapentin (NEURONTIN) 300 MG capsule Take 1 capsule (300 mg total) by mouth 3 (three) times daily. 270 capsule 3  . levothyroxine (SYNTHROID, LEVOTHROID) 75 MCG tablet Take 1 tablet (75 mcg total) by mouth daily before breakfast. 90 tablet 2  . losartan (COZAAR) 50 MG tablet Take 1 tablet (50 mg total) by mouth daily. 90 tablet 2  . lovastatin (MEVACOR) 40 MG tablet Take 1 tablet (40 mg total) by mouth at bedtime. 90 tablet 3  . nystatin (MYCOSTATIN) 100000 UNIT/ML suspension Take 5 mLs (500,000 Units total) by mouth 4 (four) times daily. 60 mL 0  . omeprazole (PRILOSEC) 40 MG capsule Take 1 capsule (40 mg total) by mouth daily. 90 capsule 3  . vitamin B-12 (CYANOCOBALAMIN) 1000 MCG tablet Take 1,000 mcg by mouth every 14 (fourteen) days.      No current facility-administered medications for this visit.     Allergies:   Lisinopril   Social History:  The patient  reports that she quit smoking about 40 years ago. Her smoking use included Cigarettes. She has a 2.50 pack-year smoking history. She has never used smokeless tobacco. She reports that she does not drink alcohol or use drugs.   Family History:  The patient's  family history includes Coronary artery disease in her brother; Diabetes in her brother; Heart attack (age of onset: 70) in her father; Prostate cancer in her brother and son.    ROS:  Please see the history of present illness.   All other systems are reviewed and negative.    PHYSICAL EXAM: VS:  BP 122/78   Pulse 77   Ht '5\' 4"'$  (1.626 m)  , BMI There is no height or weight on file to calculate BMI. GEN: morbidly obese and chronically ill, in a wheelchair today, in no acute distress  HEENT: normal  Neck: no JVD, carotid bruits, or masses Cardiac: RRR; no murmurs, rubs, or gallops,+ edema  Respiratory:  clear to auscultation bilaterally, normal work of breathing GI: soft, nontender,  nondistended, + BS MS: diffuse atrophy  Skin: warm and dry  Neuro:  In a wheelchair Psych: euthymic mood, full affect  Recent Labs: 09/30/2015: TSH 2.980 10/23/2015: B Natriuretic Peptide 90.7 10/24/2015: Hemoglobin 12.2 03/01/2016: Platelets 240 04/12/2016: ALT 9; BUN 15; Creatinine, Ser 0.73; Potassium 4.8; Sodium 136    Lipid Panel     Component Value Date/Time   CHOL 193 09/30/2015 1540   TRIG 85 09/30/2015 1540   TRIG 131 10/12/2014 1507   HDL 65 09/30/2015 1540   HDL 59 10/12/2014 1507   CHOLHDL 3.0 09/30/2015 1540   CHOLHDL 3.0 05/08/2014 0410   VLDL 23 05/08/2014 0410   LDLCALC 111 (H)  09/30/2015 1540     Wt Readings from Last 3 Encounters:  05/22/16 220 lb (99.8 kg)  10/25/15 230 lb (104.3 kg)  09/30/15 230 lb (104.3 kg)     ASSESSMENT AND PLAN:  1. Sinus bradycardia ILR interrogation today reveals normal histogram.   She has had no symptomatic transmissions and feels that her dizziness/ presyncope are much better presently.  No indication for additional EP intervention at this time.  2. Paroxysmal atrial fibrillation AF burden is <.1% Given AVMs and fall risk as well as very low AF burden, decision by Dr Gwenlyn Found has been to not anticoagulate.  I agree with this approach.  I did consider Watch[facility] LAAO but feel that risks would currently outweigh benefit for her.  3. HTN Stable No change required today  4. Obesity Weight loss is encouraged  Current medicines are reviewed at length with the patient today.   The patient does not have concerns regarding her medicines.  The following changes were made today:  none  Follow-up with Dr Gwenlyn Found as scheduled Return to see me in 1 year [facility]   Signed, Thompson Grayer, MD  06/25/2016 4:01 PM     [facility] [address] [place] [address] [phone] (office) [phone] (fax)

## 2016-06-26 ENCOUNTER — Ambulatory Visit (INDEPENDENT_AMBULATORY_CARE_PROVIDER_SITE_OTHER): Payer: Medicare Other | Admitting: Neurology

## 2016-06-26 ENCOUNTER — Encounter: Payer: Self-pay | Admitting: Neurology

## 2016-06-26 VITALS — BP 128/70 | HR 72 | Ht 64.0 in | Wt 220.0 lb

## 2016-06-26 DIAGNOSIS — I679 Cerebrovascular disease, unspecified: Secondary | ICD-10-CM

## 2016-06-26 DIAGNOSIS — G40109 Localization-related (focal) (partial) symptomatic epilepsy and epileptic syndromes with simple partial seizures, not intractable, without status epilepticus: Secondary | ICD-10-CM

## 2016-06-26 DIAGNOSIS — G319 Degenerative disease of nervous system, unspecified: Secondary | ICD-10-CM

## 2016-06-26 DIAGNOSIS — I639 Cerebral infarction, unspecified: Secondary | ICD-10-CM | POA: Diagnosis not present

## 2016-06-26 DIAGNOSIS — R6884 Jaw pain: Secondary | ICD-10-CM

## 2016-06-26 MED ORDER — GABAPENTIN 300 MG PO CAPS: ORAL_CAPSULE | ORAL | 11 refills | Status: DC

## 2016-06-26 NOTE — Progress Notes (Signed)
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  UPDATE: Over the past year, her balance has gotten worse.  She is no longer able to ambulate with walker and now only uses the wheelchair.  She also reports pain from the right jaw radiating up to the right temple for the past several months.  It lasts a couple of seconds and occurs 2 to 3 times a week.  Sed rate from August was 8.  HISTORY: For 8 years, she has experienced gradual progression of balance problems.  She is now extremely unsteady on her feet and cannot ambulate without assistance.  She notes some associated dizziness with movement.  She also reports that her handwriting has gotten worse.  She has history of some swallowing issues when she eats and has had endoscopy with dilatation in the past.  She does have stress incontinence.  She has both horizontal and vertical diplopia, which was previously briefly treated with prisms.  PT has not helped with gait or balance in the past.   To evaluate diplopia, she had an MRI of the brain and orbits with and without contrast on 06/07/15, which showed no acute intracranial abnormalities.  It did reveal small vessel ischemic changes, global atrophy, particularly of the cerebellum, and area of signal abnormality within the corpus callosum, all known previously known entities.  Recent myasthenia gravis panel and thyrotropin receptor antibody was negative.  TSH and Free T4 from April were normal.   She has a sister with cerebellar degeneration, diagnosed 15 years ago, and has gradually become disabled.  She has another sister with multiple sclerosis.   She takes gabapentin for  "smelling seizures" which were diagnosed 14 years ago.  She reports history of an AVM on the right side of her face, diagnosed many years ago and therefore cannot use blood thinners.  However, prior MRA of the head and neck did not reveal any AVM.   PAST MEDICAL HISTORY: Past Medical History:  Diagnosis Date  . Acute respiratory failure with hypoxia ([place]) 10/23/2015  . Allergic rhinitis    PT. DENIES  . Anxiety   . Arthritis    NECK  . Ataxia   . Bradycardia    primarily nocturnal  . Burning tongue syndrome 25 years  . Cataract   . Cerebellar degeneration   . Chronic pericarditis   . Chronic urinary tract infection   . Complication of anesthesia    low o2 sats, coded 30 years ago  . CVA (cerebral infarction) 05/2003  . Depression   . Gait disorder   . Gastric polyps   . GERD (gastroesophageal reflux disease)   . High cholesterol   . Hyperlipidemia   . Hypertension   . Hypertension   . Hypotension   . Hypothyroidism   . IBS (irritable bowel syndrome)   . Obesity   . Paroxysmal atrial fibrillation ([name])    chads2vasc score is 6,  she is felt to be a poor candidate for anticoagulation  . Personal history of arterial venous malformation (AVM)    right side of face  . Seizure disorder ([place])   . Seizures ([place]) 2003   " smelling"- Gabapentin "no problem"  . Shortness of breath dyspnea    with exertion  .  Sternum fx 10/27/2013  . Stroke [facility]) 5 years ago   Right side of face weak, slurred speach-   . Thyroid disease   . TIA (transient ischemic attack)   . UTI (lower urinary tract infection) 03/27/2016   "frequently"    MEDICATIONS: Current Outpatient Prescriptions on File Prior to Visit  Medication Sig Dispense Refill  . ALPRAZolam (XANAX) 0.25 MG tablet Take 1 tablet (0.25 mg total) by mouth at bedtime as needed (or insomnia). 12 tablet 2  . amitriptyline (ELAVIL) 50 MG tablet Take 0.5 tablets (25 mg total) by mouth at bedtime. 90 tablet 2  . benzonatate (TESSALON)  100 MG capsule Take 1 capsule (100 mg total) by mouth 2 (two) times daily as needed for cough. 20 capsule 0  . escitalopram (LEXAPRO) 20 MG tablet Take 1 tablet (20 mg total) by mouth at bedtime. 90 tablet 2  . furosemide (LASIX) 40 MG tablet Take 40 mg by mouth daily as needed for fluid.    Marland Kitchen levothyroxine (SYNTHROID, LEVOTHROID) 75 MCG tablet Take 1 tablet (75 mcg total) by mouth daily before breakfast. 90 tablet 2  . losartan (COZAAR) 50 MG tablet Take 1 tablet (50 mg total) by mouth daily. 90 tablet 2  . lovastatin (MEVACOR) 40 MG tablet Take 1 tablet (40 mg total) by mouth at bedtime. 90 tablet 3  . nystatin (MYCOSTATIN) 100000 UNIT/ML suspension Take 5 mLs (500,000 Units total) by mouth 4 (four) times daily. 60 mL 0  . omeprazole (PRILOSEC) 40 MG capsule Take 1 capsule (40 mg total) by mouth daily. 90 capsule 3  . vitamin B-12 (CYANOCOBALAMIN) 1000 MCG tablet Take 1,000 mcg by mouth every 14 (fourteen) days.      No current facility-administered medications on file prior to visit.     ALLERGIES: Allergies  Allergen Reactions  . Lisinopril Cough    FAMILY HISTORY: Family History  Problem Relation Age of Onset  . Heart attack Father 26    fatal  . Coronary artery disease Brother   . Diabetes Brother   . Prostate cancer Brother   . Prostate cancer Son     SOCIAL HISTORY: Social History   Social History  . Marital status: Married    Spouse name: N/A  . Number of children: N/A  . Years of education: N/A   Occupational History  . Not on file.   Social History Main Topics  . Smoking status: Former Smoker    Packs/day: 0.25    Years: 10.00    Types: Cigarettes    Quit date: 07/31/1975  . Smokeless tobacco: Never Used  . Alcohol use No     Comment: Rare- maybe a drink ever 2 years  . Drug use: No  . Sexual activity: Not on file   Other Topics Concern  . Not on file   Social History Narrative   Lives with husband, does have stairs, does not use them. Pt completed  10th grade.    REVIEW OF SYSTEMS: Constitutional: No fevers, chills, or sweats, no generalized fatigue, change in appetite Eyes: No visual changes, double vision, eye pain Ear, nose and throat: No hearing loss, ear pain, nasal congestion, sore throat Cardiovascular: No chest pain, palpitations Respiratory:  No shortness of breath at rest or with exertion, wheezes GastrointestinaI: No nausea, vomiting, diarrhea, abdominal pain, fecal incontinence Genitourinary:  No dysuria, urinary retention or frequency Musculoskeletal:  No neck pain, back pain Integumentary: No rash, pruritus, skin lesions Neurological: as above Psychiatric: No depression, insomnia, anxiety  Endocrine: No palpitations, fatigue, diaphoresis, mood swings, change in appetite, change in weight, increased thirst Hematologic/Lymphatic:  No purpura, petechiae. Allergic/Immunologic: no itchy/runny eyes, nasal congestion, recent allergic reactions, rashes  PHYSICAL EXAM: Vitals:   06/26/16 1357  BP: 128/70  Pulse: 72   General: No acute distress.  Patient appears well-groomed.   Head:  Normocephalic/atraumatic.  Tenderness to right TMJ Eyes:  Fundi examined but not visualized Neck: supple, no paraspinal tenderness, full range of motion Heart:  Regular rate and rhythm Lungs:  Clear to auscultation bilaterally Back: No paraspinal tenderness Neurological Exam: alert and oriented to person, place, and time. Attention span and concentration intact, recent and remote memory intact, fund of knowledge intact.  Speech fluent and not dysarthric, language intact.  CN II-XII intact. Bulk and tone normal, muscle strength 5/5 throughout.  Sensation to light touch, temperature and vibration intact.  Deep tendon reflexes 3+ throughout, toes downgoing.  Finger to nose intact but heel to shin testing with dysmetria.  Unable to ambulate.  IMPRESSION: 1. 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  Explained that this is progressive.  Unfortunately,  definitive testing requires genetic testing, which is extremely costly.  We can refer her to an academic center, however it would likely not change management 2.  Cerebrovascular disease.  Ideally, she would benefit from antiplatelet therapy (or anticoagulation given history of PAF as well) to address cerebrovascular disease.  Unfortunately, she is unable to take blood thinners due to a large AVM in her face (questionable given no AVM was seen on MRA). 3. simple partial seizures ("smelling seizures"), stable 4.  Right facial pain, possibly TMJ dysfunction  PLAN: 1.  Will refer to PT to see if she can improve her gait at all (at least to regain use of a walker) 2.  Increase gabapentin to '600mg'$  in AM, '300mg'$  at noon and '600mg'$  at night to address facial pain.  Recommend evaluation by a dentist for TMJ. 3.  Follow up in one year  26 minutes spent face to face with patient, over 50% spent counseling.   Metta Clines, DO  CC:  Evelina Dun, FNP

## 2016-06-26 NOTE — Patient Instructions (Signed)
1.  To help with improving balance, we will order you physical therapy. 2.  The right facial pain may be related to temporomandibular joint dysfunction.  I recommend seeing a dentist for evaluation. 3.  We will also increase gabapentin '300mg'$  pills to see if we can improve the facial pain:  Take 1 pill in AM, 1 pill at noon and 2 pills at bedtime for 1 week  Then 2 pills in AM, 1 pill at noon and 2 pills at bedtime 4.  Follow up in one year.

## 2016-06-27 ENCOUNTER — Other Ambulatory Visit: Payer: Self-pay | Admitting: Family

## 2016-06-27 DIAGNOSIS — E038 Other specified hypothyroidism: Secondary | ICD-10-CM

## 2016-06-29 ENCOUNTER — Ambulatory Visit (INDEPENDENT_AMBULATORY_CARE_PROVIDER_SITE_OTHER): Payer: Medicare Other | Admitting: *Deleted

## 2016-06-29 DIAGNOSIS — R002 Palpitations: Secondary | ICD-10-CM

## 2016-07-02 NOTE — Progress Notes (Signed)
Carelink Summary Report / Loop Recorder 

## 2016-07-04 ENCOUNTER — Telehealth: Payer: Self-pay | Admitting: *Deleted

## 2016-07-04 NOTE — Telephone Encounter (Signed)
Spoke with patient regarding pause episode on LINQ from 06/29/16.  Episode occurred at 0740, duration 5 sec.  Patient reports that she was asleep at the time of the episode.  She usually sleeps until around 1100 as she stays awake until 0200.  Patient agrees to call the [facility] if she has any episodes of dizziness/presyncope in the future.  Patient is appreciative of call and denies additional questions or concerns at this time.

## 2016-07-07 LAB — CUP PACEART REMOTE DEVICE CHECK
Date Time Interrogation Session: 20171101153658
MDC IDC PG IMPLANT DT: 20160609

## 2016-07-07 NOTE — Progress Notes (Signed)
Carelink summary report received. Battery status OK. Normal device function. No new symptom episodes, tachy episodes, brady, or pause episodes. No new AF episodes. Monthly summary reports and ROV/PRN 

## 2016-07-08 ENCOUNTER — Other Ambulatory Visit: Payer: Self-pay | Admitting: Family

## 2016-07-13 ENCOUNTER — Encounter (HOSPITAL_COMMUNITY): Payer: Self-pay | Admitting: Cardiology

## 2016-07-13 ENCOUNTER — Inpatient Hospital Stay (HOSPITAL_COMMUNITY)
Admission: EM | Admit: 2016-07-13 | Discharge: 2016-07-18 | DRG: 064 | Disposition: A | Discharge status: Home Health | Payer: Medicare Other | Attending: Internal Medicine | Admitting: Internal Medicine

## 2016-07-13 ENCOUNTER — Observation Stay (HOSPITAL_COMMUNITY): Payer: Medicare Other

## 2016-07-13 ENCOUNTER — Emergency Department (HOSPITAL_COMMUNITY): Payer: Medicare Other

## 2016-07-13 DIAGNOSIS — R51 Headache: Secondary | ICD-10-CM | POA: Diagnosis not present

## 2016-07-13 DIAGNOSIS — M25552 Pain in left hip: Secondary | ICD-10-CM | POA: Diagnosis not present

## 2016-07-13 DIAGNOSIS — F329 Major depressive disorder, single episode, unspecified: Secondary | ICD-10-CM | POA: Diagnosis present

## 2016-07-13 DIAGNOSIS — Z888 Allergy status to other drugs, medicaments and biological substances status: Secondary | ICD-10-CM

## 2016-07-13 DIAGNOSIS — R935 Abnormal findings on diagnostic imaging of other abdominal regions, including retroperitoneum: Secondary | ICD-10-CM

## 2016-07-13 DIAGNOSIS — J189 Pneumonia, unspecified organism: Secondary | ICD-10-CM | POA: Diagnosis present

## 2016-07-13 DIAGNOSIS — Z9181 History of falling: Secondary | ICD-10-CM

## 2016-07-13 DIAGNOSIS — R930 Abnormal findings on diagnostic imaging of skull and head, not elsewhere classified: Secondary | ICD-10-CM

## 2016-07-13 DIAGNOSIS — G319 Degenerative disease of nervous system, unspecified: Secondary | ICD-10-CM

## 2016-07-13 DIAGNOSIS — K529 Noninfective gastroenteritis and colitis, unspecified: Secondary | ICD-10-CM | POA: Diagnosis not present

## 2016-07-13 DIAGNOSIS — R471 Dysarthria and anarthria: Secondary | ICD-10-CM | POA: Diagnosis present

## 2016-07-13 DIAGNOSIS — I480 Paroxysmal atrial fibrillation: Secondary | ICD-10-CM | POA: Diagnosis present

## 2016-07-13 DIAGNOSIS — Z79899 Other long term (current) drug therapy: Secondary | ICD-10-CM

## 2016-07-13 DIAGNOSIS — E785 Hyperlipidemia, unspecified: Secondary | ICD-10-CM | POA: Diagnosis present

## 2016-07-13 DIAGNOSIS — E869 Volume depletion, unspecified: Secondary | ICD-10-CM | POA: Diagnosis present

## 2016-07-13 DIAGNOSIS — I639 Cerebral infarction, unspecified: Principal | ICD-10-CM | POA: Diagnosis present

## 2016-07-13 DIAGNOSIS — K769 Liver disease, unspecified: Secondary | ICD-10-CM | POA: Diagnosis not present

## 2016-07-13 DIAGNOSIS — G40909 Epilepsy, unspecified, not intractable, without status epilepticus: Secondary | ICD-10-CM | POA: Diagnosis present

## 2016-07-13 DIAGNOSIS — I10 Essential (primary) hypertension: Secondary | ICD-10-CM | POA: Diagnosis present

## 2016-07-13 DIAGNOSIS — S79912A Unspecified injury of left hip, initial encounter: Secondary | ICD-10-CM | POA: Diagnosis not present

## 2016-07-13 DIAGNOSIS — R29898 Other symptoms and signs involving the musculoskeletal system: Secondary | ICD-10-CM

## 2016-07-13 DIAGNOSIS — A084 Viral intestinal infection, unspecified: Secondary | ICD-10-CM | POA: Diagnosis present

## 2016-07-13 DIAGNOSIS — Z8249 Family history of ischemic heart disease and other diseases of the circulatory system: Secondary | ICD-10-CM

## 2016-07-13 DIAGNOSIS — K589 Irritable bowel syndrome without diarrhea: Secondary | ICD-10-CM | POA: Diagnosis present

## 2016-07-13 DIAGNOSIS — R29810 Facial weakness: Secondary | ICD-10-CM | POA: Diagnosis present

## 2016-07-13 DIAGNOSIS — Z833 Family history of diabetes mellitus: Secondary | ICD-10-CM

## 2016-07-13 DIAGNOSIS — N393 Stress incontinence (female) (male): Secondary | ICD-10-CM | POA: Diagnosis present

## 2016-07-13 DIAGNOSIS — R160 Hepatomegaly, not elsewhere classified: Secondary | ICD-10-CM | POA: Diagnosis present

## 2016-07-13 DIAGNOSIS — Z87891 Personal history of nicotine dependence: Secondary | ICD-10-CM

## 2016-07-13 DIAGNOSIS — R531 Weakness: Secondary | ICD-10-CM

## 2016-07-13 DIAGNOSIS — D329 Benign neoplasm of meninges, unspecified: Secondary | ICD-10-CM

## 2016-07-13 DIAGNOSIS — R404 Transient alteration of awareness: Secondary | ICD-10-CM | POA: Diagnosis not present

## 2016-07-13 DIAGNOSIS — R911 Solitary pulmonary nodule: Secondary | ICD-10-CM | POA: Diagnosis present

## 2016-07-13 DIAGNOSIS — K219 Gastro-esophageal reflux disease without esophagitis: Secondary | ICD-10-CM | POA: Diagnosis present

## 2016-07-13 DIAGNOSIS — I319 Disease of pericardium, unspecified: Secondary | ICD-10-CM | POA: Diagnosis present

## 2016-07-13 DIAGNOSIS — F419 Anxiety disorder, unspecified: Secondary | ICD-10-CM | POA: Diagnosis present

## 2016-07-13 DIAGNOSIS — Z8673 Personal history of transient ischemic attack (TIA), and cerebral infarction without residual deficits: Secondary | ICD-10-CM

## 2016-07-13 DIAGNOSIS — M6281 Muscle weakness (generalized): Secondary | ICD-10-CM

## 2016-07-13 DIAGNOSIS — E669 Obesity, unspecified: Secondary | ICD-10-CM | POA: Diagnosis present

## 2016-07-13 DIAGNOSIS — R55 Syncope and collapse: Secondary | ICD-10-CM | POA: Diagnosis present

## 2016-07-13 DIAGNOSIS — Z6837 Body mass index (BMI) 37.0-37.9, adult: Secondary | ICD-10-CM

## 2016-07-13 DIAGNOSIS — E039 Hypothyroidism, unspecified: Secondary | ICD-10-CM | POA: Diagnosis present

## 2016-07-13 LAB — BASIC METABOLIC PANEL
Anion gap: 8 (ref 5–15)
BUN: 13 mg/dL (ref 6–20)
CHLORIDE: 102 mmol/L (ref 101–111)
CO2: 25 mmol/L (ref 22–32)
CREATININE: 1.04 mg/dL — AB (ref 0.44–1.00)
Calcium: 9.8 mg/dL (ref 8.9–10.3)
GFR calc non Af Amer: 49 mL/min — ABNORMAL LOW (ref >60)
GFR, EST AFRICAN AMERICAN: 57 mL/min — AB (ref >60)
Glucose, Bld: 140 mg/dL — ABNORMAL HIGH (ref 65–99)
Potassium: 4.1 mmol/L (ref 3.5–5.1)
SODIUM: 135 mmol/L (ref 135–145)

## 2016-07-13 LAB — URINALYSIS, ROUTINE W REFLEX MICROSCOPIC
BILIRUBIN URINE: NEGATIVE
GLUCOSE, UA: NEGATIVE mg/dL
Hgb urine dipstick: NEGATIVE
KETONES UR: NEGATIVE mg/dL
Leukocytes, UA: NEGATIVE
NITRITE: NEGATIVE
Protein, ur: NEGATIVE mg/dL
Specific Gravity, Urine: 1.04 — ABNORMAL HIGH (ref 1.005–1.030)
pH: 5 (ref 5.0–8.0)

## 2016-07-13 LAB — CBG MONITORING, ED
Glucose-Capillary: 138 mg/dL — ABNORMAL HIGH (ref 65–99)
Glucose-Capillary: 94 mg/dL (ref 65–99)

## 2016-07-13 LAB — CBC
HCT: 42 % (ref 36.0–46.0)
HEMOGLOBIN: 13.4 g/dL (ref 12.0–15.0)
MCH: 29.9 pg (ref 26.0–34.0)
MCHC: 31.9 g/dL (ref 30.0–36.0)
MCV: 93.8 fL (ref 78.0–100.0)
Platelets: 202 10*3/uL (ref 150–400)
RBC: 4.48 MIL/uL (ref 3.87–5.11)
RDW: 13.8 % (ref 11.5–15.5)
WBC: 13.2 10*3/uL — AB (ref 4.0–10.5)

## 2016-07-13 LAB — I-STAT TROPONIN, ED: TROPONIN I, POC: 0 ng/mL (ref 0.00–0.08)

## 2016-07-13 LAB — CK: Total CK: 34 U/L — ABNORMAL LOW (ref 38–234)

## 2016-07-13 LAB — TSH: TSH: 2.224 u[IU]/mL (ref 0.350–4.500)

## 2016-07-13 MED ORDER — IOPAMIDOL (ISOVUE-300) INJECTION 61%
INTRAVENOUS | Status: AC
  Administered 2016-07-13: 30 mL
  Filled 2016-07-13: qty 30

## 2016-07-13 MED ORDER — FENTANYL CITRATE (PF) 100 MCG/2ML IJ SOLN
50.0000 ug | Freq: Once | INTRAMUSCULAR | Status: AC
  Administered 2016-07-13: 50 ug via INTRAVENOUS
  Filled 2016-07-13: qty 2

## 2016-07-13 MED ORDER — ESCITALOPRAM OXALATE 10 MG PO TABS
20.0000 mg | ORAL_TABLET | Freq: Every day | ORAL | Status: DC
  Administered 2016-07-13 – 2016-07-17 (×5): 20 mg via ORAL
  Filled 2016-07-13 (×5): qty 2

## 2016-07-13 MED ORDER — DEXTROSE-NACL 5-0.9 % IV SOLN
INTRAVENOUS | Status: DC
  Administered 2016-07-13: 75 mL/h via INTRAVENOUS
  Administered 2016-07-14 – 2016-07-17 (×6): via INTRAVENOUS

## 2016-07-13 MED ORDER — AMLODIPINE BESYLATE 5 MG PO TABS
5.0000 mg | ORAL_TABLET | Freq: Every day | ORAL | Status: DC
  Administered 2016-07-14 – 2016-07-18 (×6): 5 mg via ORAL
  Filled 2016-07-13 (×6): qty 1

## 2016-07-13 MED ORDER — PRAVASTATIN SODIUM 40 MG PO TABS
40.0000 mg | ORAL_TABLET | Freq: Every day | ORAL | Status: DC
  Administered 2016-07-14 – 2016-07-17 (×5): 40 mg via ORAL
  Filled 2016-07-13 (×5): qty 1

## 2016-07-13 MED ORDER — AMITRIPTYLINE HCL 25 MG PO TABS
25.0000 mg | ORAL_TABLET | Freq: Every day | ORAL | Status: DC
  Administered 2016-07-13 – 2016-07-17 (×5): 25 mg via ORAL
  Filled 2016-07-13 (×5): qty 1

## 2016-07-13 MED ORDER — LEVOTHYROXINE SODIUM 75 MCG PO TABS
75.0000 ug | ORAL_TABLET | Freq: Every day | ORAL | Status: DC
  Administered 2016-07-14 – 2016-07-18 (×5): 75 ug via ORAL
  Filled 2016-07-13 (×5): qty 1

## 2016-07-13 MED ORDER — IOPAMIDOL (ISOVUE-300) INJECTION 61%
100.0000 mL | Freq: Once | INTRAVENOUS | Status: AC | PRN
  Administered 2016-07-13: 100 mL via INTRAVENOUS

## 2016-07-13 MED ORDER — ALPRAZOLAM 0.25 MG PO TABS
0.2500 mg | ORAL_TABLET | Freq: Every evening | ORAL | Status: DC | PRN
  Administered 2016-07-13 – 2016-07-14 (×2): 0.25 mg via ORAL
  Filled 2016-07-13 (×2): qty 1

## 2016-07-13 MED ORDER — PANTOPRAZOLE SODIUM 40 MG PO TBEC
40.0000 mg | DELAYED_RELEASE_TABLET | Freq: Every day | ORAL | Status: DC
  Administered 2016-07-14 – 2016-07-18 (×6): 40 mg via ORAL
  Filled 2016-07-13 (×6): qty 1

## 2016-07-13 MED ORDER — LORAZEPAM 1 MG PO TABS
1.0000 mg | ORAL_TABLET | Freq: Once | ORAL | Status: AC
  Administered 2016-07-14: 1 mg via ORAL
  Filled 2016-07-13: qty 1

## 2016-07-13 MED ORDER — ONDANSETRON HCL 4 MG PO TABS: 4.0000 mg | ORAL_TABLET | Freq: Four times a day (QID) | ORAL | Status: DC | PRN

## 2016-07-13 MED ORDER — HYDROMORPHONE HCL 1 MG/ML IJ SOLN
0.5000 mg | INTRAMUSCULAR | Status: DC | PRN
  Administered 2016-07-13 – 2016-07-18 (×11): 0.5 mg via INTRAVENOUS
  Filled 2016-07-13 (×11): qty 1

## 2016-07-13 MED ORDER — TRAZODONE HCL 50 MG PO TABS
150.0000 mg | ORAL_TABLET | Freq: Every day | ORAL | Status: DC
  Administered 2016-07-13 – 2016-07-15 (×4): 150 mg via ORAL
  Filled 2016-07-13 (×3): qty 3

## 2016-07-13 MED ORDER — HEPARIN SODIUM (PORCINE) 5000 UNIT/ML IJ SOLN
5000.0000 [IU] | Freq: Three times a day (TID) | INTRAMUSCULAR | Status: DC
  Administered 2016-07-13 – 2016-07-18 (×11): 5000 [IU] via SUBCUTANEOUS
  Filled 2016-07-13 (×13): qty 1

## 2016-07-13 MED ORDER — VITAMIN B-12 1000 MCG PO TABS: 1000.0000 ug | ORAL_TABLET | ORAL | Status: DC

## 2016-07-13 MED ORDER — ONDANSETRON HCL 4 MG/2ML IJ SOLN: 4.0000 mg | Freq: Four times a day (QID) | INTRAMUSCULAR | Status: DC | PRN

## 2016-07-13 MED ORDER — LORAZEPAM 1 MG PO TABS: 1.0000 mg | ORAL_TABLET | Freq: Once | ORAL | Status: DC

## 2016-07-13 MED ORDER — GABAPENTIN 300 MG PO CAPS
300.0000 mg | ORAL_CAPSULE | Freq: Once | ORAL | Status: AC
  Administered 2016-07-13: 300 mg via ORAL
  Filled 2016-07-13: qty 1

## 2016-07-13 NOTE — ED Notes (Signed)
Assisted Nurse GP with in and out .

## 2016-07-13 NOTE — ED Provider Notes (Signed)
[facility] DEPT Provider Note   CSN: [id number] Arrival date & time: 07/13/16  1150     History   Chief Complaint Chief Complaint  Patient presents with  . Weakness    HPI [name] is a 82 y.o. female with an extensive past medical history presenting via EMS to the Ed with sudden onset of diarrhea and leg pain this morning. She lives with her husband at home and states that she was attempting to make it to the restroom from her bed as she started having diarrhea this morning around 6am. She had difficulty getting into her wheel chair and slowly slipped to the ground. She did not hit her head or loose consciousness, but later reported that she had an episode of blindness and "seeing white" for a little while. She believes she may have passed out for a split second. She denies any problems with her vision at this time. Her main concern is the pain in her legs that is out of control and now moving up her back. She also reports abdominal pain when she moves. She denies SOB, C/P, nausea/vomiting, palpitations.  HPI  Past Medical History:  Diagnosis Date  . Acute respiratory failure with hypoxia ([place]) 10/23/2015  . Allergic rhinitis    PT. DENIES  . Anxiety   . Arthritis    NECK  . Ataxia   . Bradycardia    primarily nocturnal  . Burning tongue syndrome 25 years  . Cataract   . Cerebellar degeneration   . Chronic pericarditis   . Chronic urinary tract infection   . Complication of anesthesia    low o2 sats, coded 30 years ago  . CVA (cerebral infarction) 05/2003  . Depression   . Gait disorder   . Gastric polyps   . GERD (gastroesophageal reflux disease)   . High cholesterol   . Hyperlipidemia   . Hypertension   . Hypertension   . Hypotension   . Hypothyroidism   . IBS (irritable bowel syndrome)   . Obesity   . Paroxysmal atrial fibrillation ([name])    chads2vasc score is 6,  she is felt to be a poor candidate for anticoagulation  . Personal history of arterial  venous malformation (AVM)    right side of face  . Seizure disorder ([place])   . Seizures ([place]) 2003   " smelling"- Gabapentin "no problem"  . Shortness of breath dyspnea    with exertion  . Sternum fx 10/27/2013  . Stroke [facility]) 5 years ago   Right side of face weak, slurred speach-   . Thyroid disease   . TIA (transient ischemic attack)   . UTI (lower urinary tract infection) 03/27/2016   "frequently"    Patient Active Problem List   Diagnosis Date Noted  . Volume depletion 07/13/2016  . Diarrhea   . SOB (shortness of breath) 10/23/2015  . Asthmatic bronchitis 10/23/2015  . GAD (generalized anxiety disorder) 09/30/2015  . Peripheral edema 09/30/2015  . Insomnia 09/30/2015  . Localization-related partial epilepsy with simple partial seizures ([place]) 06/27/2015  . Allergic rhinitis 05/24/2015  . Atrial fibrillation ([place]) 01/06/2015  . Syncope 01/06/2015  . Sinus bradycardia 01/03/2015  . Paroxysmal atrial fibrillation ([place]) 01/03/2015  . Fatigue 01/03/2015  . Morbid obesity ([place]) 01/03/2015  . Solitary pulmonary nodule 06/02/2014  . Thyroid nodule 05/08/2014  . Palpitation 05/07/2014  . Chest pain 05/07/2014  . Depression   . Seizure disorder ([place])   . TIA (transient ischemic attack)   . Hypothyroidism   .  Cerebellar degeneration   . IRRITABLE BOWEL SYNDROME 02/18/2008  . Hyperlipemia 09/10/2006  . Essential hypertension 09/10/2006  . GERD 09/10/2006  . CVA (cerebral infarction) 05/31/2003    Past Surgical History:  Procedure Laterality Date  . APPENDECTOMY  82 years old  . COLONOSCOPY  2006, 2009  . COLONOSCOPY WITH PROPOFOL N/A 03/28/2016   Procedure: COLONOSCOPY WITH PROPOFOL;  Surgeon: Gatha Mayer, MD;  Location: [facility];  Service: Endoscopy;  Laterality: N/A;  . cyst removed  35 years ago  . EP IMPLANTABLE DEVICE N/A 01/06/2015   Procedure: Loop Recorder Insertion;  Surgeon: Thompson Grayer, MD;  Location: [facility] CV LAB;  Service: Cardiovascular;   Laterality: N/A;  . EYE SURGERY Right    Cataract  . KNEE ARTHROSCOPY Right 11/14/2006  . KNEE ARTHROSCOPY Bilateral 5 and 6 years ago  . KNEE ARTHROSCOPY WITH LATERAL MENISECTOMY  07/03/2012   Procedure: KNEE ARTHROSCOPY WITH LATERAL MENISECTOMY;  Surgeon: Magnus Sinning, MD;  Location: [facility];  Service: Orthopedics;  Laterality: Left;  with Partial Lateral Menisectomy and Medial Menisectomy. Shaving of medial and lateral femoral condyles. Shaving of patella. Removal of a loose body  . tibial and fibular internal fixation Left   . TOTAL ABDOMINAL HYSTERECTOMY  82 years old  . UPPER GASTROINTESTINAL ENDOSCOPY  2009, 2013    OB History    No data available       Home Medications    Prior to Admission medications   Medication Sig Start Date End Date Taking? Authorizing Provider  ALPRAZolam (XANAX) 0.25 MG tablet Take 1 tablet (0.25 mg total) by mouth at bedtime as needed (or insomnia). 06/06/16  Yes Eustaquio Maize, MD  amLODipine (NORVASC) 5 MG tablet TAKE 1 TABLET DAILY 07/09/16  Yes Sharion Balloon, FNP  escitalopram (LEXAPRO) 20 MG tablet Take 1 tablet (20 mg total) by mouth at bedtime. 09/30/15  Yes Sharion Balloon, FNP  furosemide (LASIX) 40 MG tablet Take 40 mg by mouth daily as needed for fluid.   Yes Historical Provider, MD  gabapentin (NEURONTIN) 300 MG capsule 2 caps in AM, 1 cap at noon and 2 caps in PM daily Patient taking differently: Take 300-600 mg by mouth 3 (three) times daily. 2 caps in AM, 1 cap at noon and 2 caps in PM daily 06/26/16  Yes Adam Telford Nab, DO  levothyroxine (SYNTHROID, LEVOTHROID) 75 MCG tablet TAKE 1 TABLET DAILY BEFORE BREAKFAST 06/27/16  Yes Sharion Balloon, FNP  losartan (COZAAR) 50 MG tablet Take 1 tablet (50 mg total) by mouth daily. 09/30/15  Yes Sharion Balloon, FNP  lovastatin (MEVACOR) 40 MG tablet Take 1 tablet (40 mg total) by mouth at bedtime. 09/30/15  Yes Sharion Balloon, FNP  nitrofurantoin (MACRODANTIN) 50 MG capsule Take 50 mg by mouth daily.   07/03/16  Yes Historical Provider, MD  nystatin (MYCOSTATIN) 100000 UNIT/ML suspension Take 5 mLs (500,000 Units total) by mouth 4 (four) times daily. Patient taking differently: Take 5 mLs by mouth daily as needed (for oral yeast/irritation).  04/12/16  Yes Sharion Balloon, FNP  omeprazole (PRILOSEC) 40 MG capsule Take 1 capsule (40 mg total) by mouth daily. 09/30/15  Yes Sharion Balloon, FNP  traZODone (DESYREL) 150 MG tablet Take 75-150 mg by mouth at bedtime.  04/22/16  Yes Historical Provider, MD    Family History Family History  Problem Relation Age of Onset  . Heart attack Father 64    fatal  . Coronary artery disease  Brother   . Diabetes Brother   . Prostate cancer Brother   . Prostate cancer Son     Social History Social History  Substance Use Topics  . Smoking status: Former Smoker    Packs/day: 0.25    Years: 10.00    Types: Cigarettes    Quit date: 07/31/1975  . Smokeless tobacco: Never Used  . Alcohol use No     Comment: Rare- maybe a drink ever 2 years     Allergies   Lisinopril   Review of Systems Review of Systems  Constitutional: Positive for chills. Negative for diaphoresis and fever.  HENT: Negative for congestion and sore throat.   Eyes: Positive for visual disturbance.  Respiratory: Negative for cough, chest tightness, shortness of breath and wheezing.   Cardiovascular: Negative for chest pain, palpitations and leg swelling.  Gastrointestinal: Positive for abdominal pain and diarrhea. Negative for abdominal distention, blood in stool, nausea and vomiting.  Genitourinary: Negative for dysuria, hematuria, vaginal bleeding and vaginal discharge.  Musculoskeletal: Positive for gait problem.       Reports diffuse pain in both legs from the groin down to her feet.  Skin: Negative for color change, pallor, rash and wound.  Neurological: Positive for weakness. Negative for dizziness, syncope, facial asymmetry, speech difficulty, light-headedness and numbness.      Physical Exam Updated Vital Signs BP 109/63 (BP Location: Left Arm)   Pulse 78   Temp 98.4 F (36.9 C) (Oral)   Resp 22   Ht '5\' 4"'$  (1.626 m)   Wt 99.8 kg   SpO2 97%   BMI 37.76 kg/m   Physical Exam  Constitutional: She is oriented to person, place, and time. She appears well-developed and well-nourished. No distress.  Afebrile, non-toxic appearing, lying in bed in discomfort  HENT:  Head: Normocephalic and atraumatic.  Eyes: EOM are normal. Right eye exhibits no discharge. Left eye exhibits no discharge. No scleral icterus.  Neck: Normal range of motion.  Cardiovascular: Normal rate, regular rhythm, normal heart sounds and intact distal pulses.   Strong dorsalis pedis pulses bilaterally. No lower extremity edema  Pulmonary/Chest: Effort normal and breath sounds normal. No respiratory distress. She has no wheezes. She exhibits no tenderness.  Abdominal: Soft. She exhibits no distension and no mass. There is tenderness. There is no guarding.  Musculoskeletal:  Difficulty moving lower extremities due to pain. Full passive range of motion but unable to hold legs up from straight leg rise. 5/5 and equal strenght at the ankles, flexing and extending.  Neurological: She is alert and oriented to person, place, and time. No cranial nerve deficit or sensory deficit.  Skin: Skin is warm and dry. Capillary refill takes less than 2 seconds. No rash noted. She is not diaphoretic. No erythema. No pallor.  Psychiatric: She has a normal mood and affect. Her behavior is normal.  Nursing note and vitals reviewed.    ED Treatments / Results  Labs (all labs ordered are listed, but only abnormal results are displayed) Labs Reviewed  BASIC METABOLIC PANEL - Abnormal; Notable for the following:       Result Value   Glucose, Bld 140 (*)    Creatinine, Ser 1.04 (*)    GFR calc non Af Amer 49 (*)    GFR calc Af Amer 57 (*)    All other components within normal limits  CBC - Abnormal;  Notable for the following:    WBC 13.2 (*)    All other components within normal  limits  URINALYSIS, ROUTINE W REFLEX MICROSCOPIC - Abnormal; Notable for the following:    APPearance HAZY (*)    Specific Gravity, Urine 1.040 (*)    All other components within normal limits  CK - Abnormal; Notable for the following:    Total CK 34 (*)    All other components within normal limits  CBG MONITORING, ED - Abnormal; Notable for the following:    Glucose-Capillary 138 (*)    All other components within normal limits  GASTROINTESTINAL PANEL BY PCR, STOOL (REPLACES STOOL CULTURE)  TSH  COMPREHENSIVE METABOLIC PANEL  CBC  I-STAT TROPOININ, ED  CBG MONITORING, ED    EKG  EKG Interpretation  Date/Time:  Friday July 13 2016 12:03:32 EST Ventricular Rate:  85 PR Interval:    QRS Duration: 92 QT Interval:  386 QTC Calculation: 459 R Axis:   27 Text Interpretation:  Sinus or ectopic atrial rhythm Inferior infarct, old isolated Q wave in 3, no other cahnges - diffuse T wave flattening in precordial leads Abnormal ekg Confirmed by Sabra Heck  MD, BRIAN ([id number]) on 07/13/2016 1:16:46 PM       Radiology Ct Head Wo Contrast  Addendum Date: 07/13/2016   ADDENDUM REPORT: 07/13/2016 18:11 ADDENDUM: These results were called by telephone at the time of interpretation on 07/13/2016 at 2:50 pm to Dr. Sabra Heck, who verbally acknowledged these results. Electronically Signed   By: Fidela Salisbury M.D.   On: 07/13/2016 18:11   Result Date: 07/13/2016 CLINICAL DATA:  Generalized pain and weakness.  Diarrhea. EXAM: CT HEAD WITHOUT CONTRAST TECHNIQUE: Contiguous axial images were obtained from the base of the skull through the vertex without intravenous contrast. COMPARISON:  Head MRI 06/07/2015 FINDINGS: Brain: No evidence of acute infarction, or hydrocephalus. There is a 10 mm rounded hyperdense structure abutting the paramedian right occipital cortex along the tentorium. Prominent areas of  hypoattenuation within the cortex of bilateral cerebellum are stable, accounting for differences in technique compared to the brain MRI from November 2016. There is moderate deep white matter microangiopathy and mild brain parenchymal volume loss. Vascular: No hyperdense vessels. Skull: Normal. Negative for fracture or focal lesion. Sinuses/Orbits: No acute finding. Other: None. IMPRESSION: No evidence of acute infarction. 10 mm rounded hyperdense structure abutting the paramedian right occipital cortex along the tentorium. This may represent a small amount of subdural hemorrhage, or malformation of the straight sinus. Further evaluation with brain MRI may be considered if found clinically relevant. Moderate chronic microvascular disease. Areas of hypoattenuation in bilateral cerebellar cortex, likely sequela of prior ischemia. Electronically Signed: By: Fidela Salisbury M.D. On: 07/13/2016 14:46   Ct Abdomen Pelvis W Contrast  Result Date: 07/13/2016 CLINICAL DATA:  Abdominal pain and generalized weakness and thick thick and this morning. Diarrhea. EXAM: CT ABDOMEN AND PELVIS WITH CONTRAST TECHNIQUE: Multidetector CT imaging of the abdomen and pelvis was performed using the standard protocol following bolus administration of intravenous contrast. CONTRAST:  41m ISOVUE-300 IOPAMIDOL (ISOVUE-300) INJECTION 61%, 1011mISOVUE-300 IOPAMIDOL (ISOVUE-300) INJECTION 61% COMPARISON:  CT scan 02/19/2008 FINDINGS: Lower chest: The lung bases are clear of acute process. No infiltrates or effusions. There is a 5 mm pulmonary nodule in the left lower lobe on image number 12 and a 4 mm left lower lobe pulmonary nodule on image number 6. I believe both pulmonary nodules were present on the prior CT and not changed. Stable coronary artery calcifications. The heart is normal in size. No pericardial effusion. The distal esophagus is grossly normal. Moderate atherosclerotic  calcifications involving the distal descending  thoracic aorta. Hepatobiliary: There are ill-defined liver lesions which are worrisome for new hepatic metastatic disease. The largest lesion is in segment 8 and measures 2.5 cm on image 20. There is also a 2.2 cm segment 3 lesion on image number 23. New line no intrahepatic biliary dilatation. The gallbladder is normal. No common bile duct dilatation. Pancreas: No mass, inflammation or ductal dilatation. Moderate fatty change noted in the pancreatic head. Spleen: Normal size.  No focal lesions. Adrenals/Urinary Tract: The adrenal glands and kidneys are unremarkable. Fetal lobulations versus remote scarring changes. A lower pole left renal calculus is noted. Stomach/Bowel: The stomach, duodenum, small bowel and colon are grossly normal. There is mild wall thickening and submucosal edema involving the transverse and descending colon suggesting colitis and likely response for the patient's diarrhea. I do not see a definite colon cancer to account for liver lesions. The terminal ileum is normal. Vascular/Lymphatic: Advanced atherosclerotic calcifications involving the aorta and branch vessel ostia. No mesenteric or retroperitoneal mass or adenopathy. The major venous structures are patent. Reproductive: The uterus is surgically absent. The right ovary is still present and appears normal. I do not see the left ovary for certain. Other: Bladder is normal. No pelvic mass or adenopathy. No free pelvic fluid collections. No inguinal mass or adenopathy. No abdominal wall hernia or subcutaneous lesions. Musculoskeletal: No significant bony findings. IMPRESSION: 1. Stable left pulmonary nodules. 2. The hepatic lesions are worrisome for metastatic disease. No obvious primary tumor is identified. Specifically no obvious gastric, pancreatic or colon cancer. Chest CT may be helpful to exclude lung cancer or breast cancer. 3. Liver lesions should be amenable to image guided biopsy if clinically necessary. 4. Suspect transverse and  descending colon colitis likely accounting for the patient's diarrhea. Electronically Signed   By: Marijo Sanes M.D.   On: 07/13/2016 14:45   Ct Hip Left Wo Contrast  Result Date: 07/13/2016 CLINICAL DATA:  Complains of generalized pain worse in the legs sharp intermittent pain to left hip EXAM: CT OF THE LEFT HIP WITHOUT CONTRAST TECHNIQUE: Multidetector CT imaging of the left hip was performed according to the standard protocol. Multiplanar CT image reconstructions were also generated. COMPARISON:  07/13/2016, 05/07/2014 FINDINGS: Bones/Joint/Cartilage No acute displaced fracture or dislocation is evident. Joint space compartment appears relatively maintained. Pubic symphysis and rami appear intact. Proximal femur without fracture. Tiny sclerotic focus left ischium possible bone island, no lytic lesions are visualized. Ligaments Suboptimally assessed by CT. Muscles and Tendons Mild fatty atrophy of the left hip musculature. No soft tissue mass. Soft tissues Mild left femoral artery calcification. No significant hip effusion. Residual contrast material within the bladder. IMPRESSION: No acute osseous abnormality. No significant hip effusion or suspicious bone lesion. Residual contrast within the bladder. Electronically Signed   By: Donavan Foil M.D.   On: 07/13/2016 20:07   Dg Hip Unilat W Or Wo Pelvis 2-3 Views Left  Result Date: 07/13/2016 CLINICAL DATA:  Left hip pain.  Fall. EXAM: DG HIP (WITH OR WITHOUT PELVIS) 2-3V LEFT COMPARISON:  CT abdomen and pelvis performed earlier today.  The FINDINGS: Early symmetric degenerative changes in the hips bilaterally. SI joints are symmetric and unremarkable. No acute bony abnormality. Specifically, no fracture, subluxation, or dislocation. Soft tissues are intact. Oral contrast material noted within bowel loops in the right lower quadrant. Contrast material noted within the bladder. IMPRESSION: No acute bony abnormality. Electronically Signed   By: Rolm Baptise M.D.  On: 07/13/2016 18:16    Procedures Procedures (including critical care time)  Medications Ordered in ED Medications  traZODone (DESYREL) tablet 150 mg (not administered)  amLODipine (NORVASC) tablet 5 mg (not administered)  levothyroxine (SYNTHROID, LEVOTHROID) tablet 75 mcg (not administered)  ALPRAZolam (XANAX) tablet 0.25 mg (not administered)  amitriptyline (ELAVIL) tablet 25 mg (not administered)  escitalopram (LEXAPRO) tablet 20 mg (not administered)  pravastatin (PRAVACHOL) tablet 40 mg (not administered)  pantoprazole (PROTONIX) EC tablet 40 mg (not administered)  heparin injection 5,000 Units (not administered)  dextrose 5 %-0.9 % sodium chloride infusion (not administered)  ondansetron (ZOFRAN) tablet 4 mg (not administered)    Or  ondansetron (ZOFRAN) injection 4 mg (not administered)  HYDROmorphone (DILAUDID) injection 0.5 mg (not administered)  LORazepam (ATIVAN) tablet 1 mg (not administered)  iopamidol (ISOVUE-300) 61 % injection (30 mLs  Contrast Given 07/13/16 1254)  fentaNYL (SUBLIMAZE) injection 50 mcg (50 mcg Intravenous Given 07/13/16 1326)  iopamidol (ISOVUE-300) 61 % injection 100 mL (100 mLs Intravenous Contrast Given 07/13/16 1415)  gabapentin (NEURONTIN) capsule 300 mg (300 mg Oral Given 07/13/16 1613)     Initial Impression / Assessment and Plan / ED Course  I have reviewed the triage vital signs and the nursing notes.  Pertinent labs & imaging results that were available during my care of the patient were reviewed by me and considered in my medical decision making (see chart for details).  Clinical Course as of Jul 13 2317  Fri Jul 13, 2016  1533 CT Head Wo Contrast [JM]    Clinical Course User Index [JM] Emeline General, PA-C   82 y/o presenting initially with new onset of severe bilateral leg pain, but further history also revealed episodes of possible syncope. She recalls not being able to see for an extended period and possibly  "passing out" a few times but patient was unsure. She reports diarrhea and also exhibited RUQ and RLQ tenderness on exam. There were no clear exam findings to explain the severity of her leg pain, both legs were soft, warm and showed signs of good perfusion. She didn't appear tender to palpation of her legs but reports generalized pain and weakness.  Ordered non-contrast head CT and a contrast CT of her abdomen and pelvis. Head CT showed a 10 mm rounded hyperdense structure granting consideration for evaluation with brain MRI.  Abd/pelvis CT showed liver masses concerning for metastatic process with no obvious primary lesion seen. Pain was controlled and patient was hemodynamically stable while in the ED.  Patient will be admitted for further evaluation and potential MRI.  Patient was also seen by supervising attending Dr. Sabra Heck who agrees with assessment and plan and spoke to admitting physician regarding this patient.  Final Clinical Impressions(s) / ED Diagnoses   Final diagnoses:  Colitis  Weakness  Liver masses    New Prescriptions Current Discharge Medication List       Emeline General, PA-C 07/14/16 [place], MD 07/15/16 2015

## 2016-07-13 NOTE — H&P (Signed)
History and Physical    [name] GEZ:[id number] DOB: [birth year] DOA: 07/13/2016  PCP: Evelina Dun, FNP  Patient coming from: Home.    Chief Complaint:   Syncope.   HPI: [name] is an 82 y.o. female with hx of cerebella degenration, HLD, HTN, hypothyrodism, CVA, TIA, Ataxia, PAF not on anticoagulation due to ataxia and fall risk, presented to the ER falling and fainted after having diarrhea trying to get up.  Evaluation in the ER included a head CT which showed no acute infarction, but a 10 mm rounded hyperdense structure abutting the paramedian right occipital cortex along the tentorium. This may represent a small amount of subdural hemorrhage, or malformation of the straight sinus. She also has moderate chronic microvascular disease. Areas of hypoattenuation in bilateral cerebellar cortex, likely sequela of prior ischemia.  She also complained of lower leg pain and lower abdominal pain.  Abdominal pelvic CT was done, showing hepatic lesions which were new and suspicous for metatastic lesion.  Radiologist recommended chest CT to look for metastatic source.  She also complained of pain in her legs, originally both legs, but it seems that her pain is mostly on the left hip.  Her serology showed WBC of 13.2 K, and normal Cr.  No LFT was done.  Hospitalist was asked to admit her for syncope, felt to be due to volume depletion because of diarrhea, and leg and hip pain, unable to ambulate.    ED Course:  See above.   Past Medical History:  Diagnosis Date  . Acute respiratory failure with hypoxia ([place]) 10/23/2015  . Allergic rhinitis    PT. DENIES  . Anxiety   . Arthritis    NECK  . Ataxia   . Bradycardia    primarily nocturnal  . Burning tongue syndrome 25 years  . Cataract   . Cerebellar degeneration   . Chronic pericarditis   . Chronic urinary tract infection   . Complication of anesthesia    low o2 sats, coded 30 years ago  . CVA (cerebral infarction) 05/2003  . Depression    . Gait disorder   . Gastric polyps   . GERD (gastroesophageal reflux disease)   . High cholesterol   . Hyperlipidemia   . Hypertension   . Hypertension   . Hypotension   . Hypothyroidism   . IBS (irritable bowel syndrome)   . Obesity   . Paroxysmal atrial fibrillation ([name])    chads2vasc score is 6,  she is felt to be a poor candidate for anticoagulation  . Personal history of arterial venous malformation (AVM)    right side of face  . Seizure disorder ([place])   . Seizures ([place]) 2003   " smelling"- Gabapentin "no problem"  . Shortness of breath dyspnea    with exertion  . Sternum fx 10/27/2013  . Stroke [facility]) 5 years ago   Right side of face weak, slurred speach-   . Thyroid disease   . TIA (transient ischemic attack)   . UTI (lower urinary tract infection) 03/27/2016   "frequently"    Rewiew of Systems:  Constitutional: Negative for malaise, fever and chills. No significant weight loss or weight gain Eyes: Negative for eye pain, redness and discharge, diplopia, visual changes, or flashes of light. ENMT: Negative for ear pain, hoarseness, nasal congestion, sinus pressure and sore throat. No headaches; tinnitus, drooling, or problem swallowing. Cardiovascular: Negative for chest pain, palpitations, diaphoresis, dyspnea and peripheral edema. ; No orthopnea, PND Respiratory: Negative for cough, hemoptysis,  wheezing and stridor. No pleuritic chestpain. Gastrointestinal: Negative for nausea, vomiting, diarrhea, constipation, abdominal pain, melena, blood in stool, hematemesis, jaundice and rectal bleeding.    Genitourinary: Negative for frequency, dysuria, incontinence,flank pain and hematuria; Musculoskeletal: Negative for back pain and neck pain. Negative for swelling and trauma.;  Skin: . Negative for pruritus, rash, abrasions, bruising and skin lesion.; ulcerations Neuro: Negative for headache, lightheadedness and neck stiffness. Negative for weakness, altered level of  consciousness , altered mental status, extremity weakness, burning feet, involuntary movement, seizure and syncope.  Psych: negative for anxiety, depression, insomnia, tearfulness, panic attacks, hallucinations, paranoia, suicidal or homicidal ideation   Past Surgical History:  Procedure Laterality Date  . APPENDECTOMY  82 years old  . COLONOSCOPY  2006, 2009  . COLONOSCOPY WITH PROPOFOL N/A 03/28/2016   Procedure: COLONOSCOPY WITH PROPOFOL;  Surgeon: Gatha Mayer, MD;  Location: [facility];  Service: Endoscopy;  Laterality: N/A;  . cyst removed  35 years ago  . EP IMPLANTABLE DEVICE N/A 01/06/2015   Procedure: Loop Recorder Insertion;  Surgeon: Thompson Grayer, MD;  Location: [facility];  Service: Cardiovascular;  Laterality: N/A;  . EYE SURGERY Right    Cataract  . KNEE ARTHROSCOPY Right 11/14/2006  . KNEE ARTHROSCOPY Bilateral 5 and 6 years ago  . KNEE ARTHROSCOPY WITH LATERAL MENISECTOMY  07/03/2012   Procedure: KNEE ARTHROSCOPY WITH LATERAL MENISECTOMY;  Surgeon: Magnus Sinning, MD;  Location: [facility];  Service: Orthopedics;  Laterality: Left;  with Partial Lateral Menisectomy and Medial Menisectomy. Shaving of medial and lateral femoral condyles. Shaving of patella. Removal of a loose body  . tibial and fibular internal fixation Left   . TOTAL ABDOMINAL HYSTERECTOMY  82 years old  . UPPER GASTROINTESTINAL ENDOSCOPY  2009, 2013     reports that she quit smoking about 40 years ago. Her smoking use included Cigarettes. She has a 2.50 pack-year smoking history. She has never used smokeless tobacco. She reports that she does not drink alcohol or use drugs.  Allergies  Allergen Reactions  . Lisinopril Cough    Family History  Problem Relation Age of Onset  . Heart attack Father 33    fatal  . Coronary artery disease Brother   . Diabetes Brother   . Prostate cancer Brother   . Prostate cancer Son      Prior to Admission medications   Medication Sig Start Date End Date  Taking? Authorizing Provider  ALPRAZolam (XANAX) 0.25 MG tablet Take 1 tablet (0.25 mg total) by mouth at bedtime as needed (or insomnia). 06/06/16  Yes Eustaquio Maize, MD  furosemide (LASIX) 40 MG tablet Take 40 mg by mouth daily as needed for fluid.   Yes Historical Provider, MD  gabapentin (NEURONTIN) 300 MG capsule 2 caps in AM, 1 cap at noon and 2 caps in PM daily Patient taking differently: Take 300-600 mg by mouth 3 (three) times daily. 2 caps in AM, 1 cap at noon and 2 caps in PM daily 06/26/16  Yes Pieter Partridge, DO  nitrofurantoin (MACRODANTIN) 50 MG capsule Take 50 mg by mouth daily.  07/03/16  Yes Historical Provider, MD  nystatin (MYCOSTATIN) 100000 UNIT/ML suspension Take 5 mLs (500,000 Units total) by mouth 4 (four) times daily. Patient taking differently: Take 5 mLs by mouth daily as needed (for oral yeast/irritation).  04/12/16  Yes Sharion Balloon, FNP  amitriptyline (ELAVIL) 50 MG tablet Take 0.5 tablets (25 mg total) by mouth at bedtime. 09/30/15   Christy  A Hawks, FNP  amLODipine (NORVASC) 5 MG tablet TAKE 1 TABLET DAILY 07/09/16   Sharion Balloon, FNP  benzonatate (TESSALON) 100 MG capsule Take 1 capsule (100 mg total) by mouth 2 (two) times daily as needed for cough. 04/12/16   Sharion Balloon, FNP  escitalopram (LEXAPRO) 20 MG tablet Take 1 tablet (20 mg total) by mouth at bedtime. 09/30/15   Sharion Balloon, FNP  levothyroxine (SYNTHROID, LEVOTHROID) 75 MCG tablet TAKE 1 TABLET DAILY BEFORE BREAKFAST 06/27/16   Sharion Balloon, FNP  losartan (COZAAR) 50 MG tablet Take 1 tablet (50 mg total) by mouth daily. 09/30/15   Sharion Balloon, FNP  lovastatin (MEVACOR) 40 MG tablet Take 1 tablet (40 mg total) by mouth at bedtime. 09/30/15   Sharion Balloon, FNP  omeprazole (PRILOSEC) 40 MG capsule Take 1 capsule (40 mg total) by mouth daily. 09/30/15   Sharion Balloon, FNP  traZODone (DESYREL) 150 MG tablet  04/22/16   Historical Provider, MD  vitamin B-12 (CYANOCOBALAMIN) 1000 MCG tablet Take 1,000  mcg by mouth every 14 (fourteen) days.     Historical Provider, MD    Physical Exam: Vitals:   07/13/16 1330 07/13/16 1400 07/13/16 1430 07/13/16 1539  BP: 124/71 125/73 130/64 115/67  Pulse: 87 87 88 83  Resp: '23 16 21 20  '$ Temp:    98.4 F (36.9 C)  TempSrc:    Oral  SpO2: 95% 100% 94% 99%  Weight:      Height:          Constitutional: NAD, calm, comfortable Vitals:   07/13/16 1330 07/13/16 1400 07/13/16 1430 07/13/16 1539  BP: 124/71 125/73 130/64 115/67  Pulse: 87 87 88 83  Resp: '23 16 21 20  '$ Temp:    98.4 F (36.9 C)  TempSrc:    Oral  SpO2: 95% 100% 94% 99%  Weight:      Height:       Eyes: PERRL, lids and conjunctivae normal ENMT: Mucous membranes are moist. Posterior pharynx clear of any exudate or lesions.Normal dentition.  Neck: normal, supple, no masses, no thyromegaly Respiratory: clear to auscultation bilaterally, no wheezing, no crackles. Normal respiratory effort. No accessory muscle use.  Cardiovascular: Regular rate and rhythm, no murmurs / rubs / gallops. No extremity edema. 2+ pedal pulses. No carotid bruits.  Abdomen: no tenderness, no masses palpated. No hepatosplenomegaly. Bowel sounds positive.  Musculoskeletal: no clubbing / cyanosis. No joint deformity upper and lower extremities. Good ROM, no contractures. Normal muscle tone. Left hip pain with movement.  Skin: no rashes, lesions, ulcers. No induration Neurologic: CN 2-12 grossly intact. Sensation intact, DTR normal. Strength 5/5 in all 4.  Psychiatric: Normal judgment and insight. Alert and oriented x 3. Normal mood.     Labs on Admission: I have personally reviewed following labs and imaging studies  CBC:  Recent Labs Lab 07/13/16 1214  WBC 13.2*  HGB 13.4  HCT 42.0  MCV 93.8  PLT 440   Basic Metabolic Panel:  Recent Labs Lab 07/13/16 1214  NA 135  K 4.1  CL 102  CO2 25  GLUCOSE 140*  BUN 13  CREATININE 1.04*  CALCIUM 9.8   GFR: Cardiac Enzymes:  Recent Labs Lab  07/13/16 1231  CKTOTAL 34*   BNP (last 3 results) CBG:  Recent Labs Lab 07/13/16 1248 07/13/16 1321  GLUCAP 138* 94   Lipid Profile: Urine analysis:    Component Value Date/Time   APPEARANCEUR Clear 02/17/2016 1707  GLUCOSEU Negative 02/17/2016 1707   BILIRUBINUR Negative 02/17/2016 1707   PROTEINUR Negative 02/17/2016 1707   UROBILINOGEN negative 05/24/2015 1434   NITRITE Negative 02/17/2016 1707   LEUKOCYTESUR Negative 02/17/2016 1707   Radiological Exams on Admission: Ct Head Wo Contrast  Result Date: 07/13/2016 CLINICAL DATA:  Generalized pain and weakness.  Diarrhea. EXAM: CT HEAD WITHOUT CONTRAST TECHNIQUE: Contiguous axial images were obtained from the base of the skull through the vertex without intravenous contrast. COMPARISON:  Head MRI 06/07/2015 FINDINGS: Brain: No evidence of acute infarction, or hydrocephalus. There is a 10 mm rounded hyperdense structure abutting the paramedian right occipital cortex along the tentorium. Prominent areas of hypoattenuation within the cortex of bilateral cerebellum are stable, accounting for differences in technique compared to the brain MRI from November 2016. There is moderate deep white matter microangiopathy and mild brain parenchymal volume loss. Vascular: No hyperdense vessels. Skull: Normal. Negative for fracture or focal lesion. Sinuses/Orbits: No acute finding. Other: None. IMPRESSION: No evidence of acute infarction. 10 mm rounded hyperdense structure abutting the paramedian right occipital cortex along the tentorium. This may represent a small amount of subdural hemorrhage, or malformation of the straight sinus. Further evaluation with brain MRI may be considered if found clinically relevant. Moderate chronic microvascular disease. Areas of hypoattenuation in bilateral cerebellar cortex, likely sequela of prior ischemia. Electronically Signed   By: Fidela Salisbury M.D.   On: 07/13/2016 14:46   Ct Abdomen Pelvis W  Contrast  Result Date: 07/13/2016 CLINICAL DATA:  Abdominal pain and generalized weakness and thick thick and this morning. Diarrhea. EXAM: CT ABDOMEN AND PELVIS WITH CONTRAST TECHNIQUE: Multidetector CT imaging of the abdomen and pelvis was performed using the standard protocol following bolus administration of intravenous contrast. CONTRAST:  34m ISOVUE-300 IOPAMIDOL (ISOVUE-300) INJECTION 61%, 1082mISOVUE-300 IOPAMIDOL (ISOVUE-300) INJECTION 61% COMPARISON:  CT scan 02/19/2008 FINDINGS: Lower chest: The lung bases are clear of acute process. No infiltrates or effusions. There is a 5 mm pulmonary nodule in the left lower lobe on image number 12 and a 4 mm left lower lobe pulmonary nodule on image number 6. I believe both pulmonary nodules were present on the prior CT and not changed. Stable coronary artery calcifications. The heart is normal in size. No pericardial effusion. The distal esophagus is grossly normal. Moderate atherosclerotic calcifications involving the distal descending thoracic aorta. Hepatobiliary: There are ill-defined liver lesions which are worrisome for new hepatic metastatic disease. The largest lesion is in segment 8 and measures 2.5 cm on image 20. There is also a 2.2 cm segment 3 lesion on image number 23. New line no intrahepatic biliary dilatation. The gallbladder is normal. No common bile duct dilatation. Pancreas: No mass, inflammation or ductal dilatation. Moderate fatty change noted in the pancreatic head. Spleen: Normal size.  No focal lesions. Adrenals/Urinary Tract: The adrenal glands and kidneys are unremarkable. Fetal lobulations versus remote scarring changes. A lower pole left renal calculus is noted. Stomach/Bowel: The stomach, duodenum, small bowel and colon are grossly normal. There is mild wall thickening and submucosal edema involving the transverse and descending colon suggesting colitis and likely response for the patient's diarrhea. I do not see a definite colon  cancer to account for liver lesions. The terminal ileum is normal. Vascular/Lymphatic: Advanced atherosclerotic calcifications involving the aorta and branch vessel ostia. No mesenteric or retroperitoneal mass or adenopathy. The major venous structures are patent. Reproductive: The uterus is surgically absent. The right ovary is still present and appears normal. I do not  see the left ovary for certain. Other: Bladder is normal. No pelvic mass or adenopathy. No free pelvic fluid collections. No inguinal mass or adenopathy. No abdominal wall hernia or subcutaneous lesions. Musculoskeletal: No significant bony findings. IMPRESSION: 1. Stable left pulmonary nodules. 2. The hepatic lesions are worrisome for metastatic disease. No obvious primary tumor is identified. Specifically no obvious gastric, pancreatic or colon cancer. Chest CT may be helpful to exclude lung cancer or breast cancer. 3. Liver lesions should be amenable to image guided biopsy if clinically necessary. 4. Suspect transverse and descending colon colitis likely accounting for the patient's diarrhea. Electronically Signed   By: Marijo Sanes M.D.   On: 07/13/2016 14:45    EKG: Independently reviewed.   Assessment/Plan Principal Problem:   Syncope Active Problems:   Cerebellar degeneration   Hypothyroidism   Paroxysmal atrial fibrillation ([name])   Atrial fibrillation ([name])   Diarrhea   Volume depletion    PLAN:   Diarrhea:  Suspect viral gastroenteritis.  Will send stool studies.  She needs IVF as she is volume depleted.   Syncope:  Due to vasovagal.  With diarrhea and volume depletion.  Will give IVF.  Abnormal CT of the abdomen:  This will need to be followed up, but it can likely be done as outpatient.  Radiologist felt that it can be approach IR for biopsy if required.   Hypothyroidism:  Will continue with supplement.  Check TSH.  Afib:  Continue with Norpace.  She is a poor candidate for anticoagulation due to ataxia and  fall risk.   Abnormal head CT:  Unclear about density on the head CT.  Radiology recommended MRI, so we will obtain one to clarify this abnormal head CT.     DVT prophylaxis:  Heparin SQ.  Code Status: FULL CODE.  Family Communication: None.  Disposition Plan: Home  Consults called: None.  Admission status: OBS.    Lauralie Blacksher MD FACP. [facility]  If 7PM-7AM, please contact night-coverage [email or web address] Password [email or web address]  07/13/2016, 5:14 PM

## 2016-07-13 NOTE — ED Provider Notes (Signed)
The patient is an 82 year old female, she has a history of spinal cerebellar degeneration, she has had a somewhat rapid decline and currently gets around with a wheelchair. She is usually able to transition herself with her transfers from the bed to the wheelchair into the commode, that being said today she had 2 episodes where she tried to get from the bed to the commode, this was partially successful but on the third attempt she was unable to make the transfer from the bed to the wheelchair and slid to the ground during which time she had a large volume of diarrhea. She felt as though her abdomen has been cramping and the first 2 attempts at a bowel movement this morning were unsuccessful. Her husband came in from outside and was able to help her to the commode to help clean her up, they then called ambulance transport. The patient reports that she usually doesn't have any leg pain however today she has had significant bilateral lower extremity pain which was read initial reason for coming to the hospital. On further history however the patient does endorse having an episode where she felt like she lost consciousness, there were several moments where she felt like she had complete loss of vision and was completely blind, she is unsure how long this lasted but it was very abnormal and she felt very poorly during this time. On exam the patient has normal pulses in all 4 extremities, no edema, no swelling, supple joints, soft compartments however she does have bilateral pain in the lower extremities regardless of where she is touched or regardless of the joint that is moved. She has normal pulses at the feet, she has a soft abdomen but with some tenderness in the lower abdomen especially on the right side. Lungs are clear, heart is regular without tachycardia or irregularity, mucous members are moist, conjunctivae are clear, no jaundice.  There is something abnormal with what is going on for the patient, it is not  clear on exam or history, we'll obtain a CT scan of the brain as well as a CT scan of the abdomen and pelvis with contrast as this could be appendicitis versus a colitis or other gastrointestinal illness. I would also consider electrolyte abnormalities or worsening of her spinal cerebellar degeneration syndrome. Her hemodynamics are rather stable and normal appearing, pain medication ordered. Anticipate admission due to generalized weakness and pain of unknown etiology with a prolonged syncopal type episode.  D/w Dr. Marin Comment who will admit  Colitis Mets to the liver Spot on the brain   EKG Interpretation  Date/Time:  Friday July 13 2016 12:03:32 EST Ventricular Rate:  85 PR Interval:    QRS Duration: 92 QT Interval:  386 QTC Calculation: 459 R Axis:   27 Text Interpretation:  Sinus or ectopic atrial rhythm Inferior infarct, old isolated Q wave in 3, no other cahnges - diffuse T wave flattening in precordial leads Abnormal ekg Confirmed by Sabra Heck  MD, Bekki Tavenner ([id number]) on 07/13/2016 1:16:46 PM       Medical screening examination/treatment/procedure(s) were conducted as a shared visit with non-physician practitioner(s) and myself.  I personally evaluated the patient during the encounter.  Clinical Impression:   Final diagnoses:  Colitis  Weakness  Liver masses         Noemi Chapel, MD 07/14/16 1319

## 2016-07-13 NOTE — ED Triage Notes (Signed)
Started feeling weak around 6 am.  Now c/o generalized pain.  Worse in legs.  Diarrhea upon arrival with EMS.  156 CBG. 101/55.

## 2016-07-14 ENCOUNTER — Observation Stay (HOSPITAL_COMMUNITY): Payer: Medicare Other

## 2016-07-14 ENCOUNTER — Encounter (HOSPITAL_COMMUNITY): Payer: Self-pay | Admitting: Radiology

## 2016-07-14 DIAGNOSIS — E869 Volume depletion, unspecified: Secondary | ICD-10-CM | POA: Diagnosis present

## 2016-07-14 DIAGNOSIS — I63031 Cerebral infarction due to thrombosis of right carotid artery: Secondary | ICD-10-CM | POA: Diagnosis not present

## 2016-07-14 DIAGNOSIS — E669 Obesity, unspecified: Secondary | ICD-10-CM | POA: Diagnosis present

## 2016-07-14 DIAGNOSIS — A084 Viral intestinal infection, unspecified: Secondary | ICD-10-CM | POA: Diagnosis present

## 2016-07-14 DIAGNOSIS — I480 Paroxysmal atrial fibrillation: Secondary | ICD-10-CM | POA: Diagnosis present

## 2016-07-14 DIAGNOSIS — R270 Ataxia, unspecified: Secondary | ICD-10-CM | POA: Diagnosis not present

## 2016-07-14 DIAGNOSIS — K529 Noninfective gastroenteritis and colitis, unspecified: Secondary | ICD-10-CM | POA: Diagnosis present

## 2016-07-14 DIAGNOSIS — R471 Dysarthria and anarthria: Secondary | ICD-10-CM | POA: Diagnosis present

## 2016-07-14 DIAGNOSIS — F329 Major depressive disorder, single episode, unspecified: Secondary | ICD-10-CM | POA: Diagnosis present

## 2016-07-14 DIAGNOSIS — I6350 Cerebral infarction due to unspecified occlusion or stenosis of unspecified cerebral artery: Secondary | ICD-10-CM | POA: Diagnosis not present

## 2016-07-14 DIAGNOSIS — R29810 Facial weakness: Secondary | ICD-10-CM | POA: Diagnosis present

## 2016-07-14 DIAGNOSIS — Z8673 Personal history of transient ischemic attack (TIA), and cerebral infarction without residual deficits: Secondary | ICD-10-CM

## 2016-07-14 DIAGNOSIS — I63032 Cerebral infarction due to thrombosis of left carotid artery: Secondary | ICD-10-CM | POA: Diagnosis not present

## 2016-07-14 DIAGNOSIS — E039 Hypothyroidism, unspecified: Secondary | ICD-10-CM | POA: Diagnosis present

## 2016-07-14 DIAGNOSIS — I10 Essential (primary) hypertension: Secondary | ICD-10-CM | POA: Diagnosis present

## 2016-07-14 DIAGNOSIS — D329 Benign neoplasm of meninges, unspecified: Secondary | ICD-10-CM

## 2016-07-14 DIAGNOSIS — R911 Solitary pulmonary nodule: Secondary | ICD-10-CM | POA: Diagnosis present

## 2016-07-14 DIAGNOSIS — J189 Pneumonia, unspecified organism: Secondary | ICD-10-CM | POA: Diagnosis present

## 2016-07-14 DIAGNOSIS — K589 Irritable bowel syndrome without diarrhea: Secondary | ICD-10-CM | POA: Diagnosis present

## 2016-07-14 DIAGNOSIS — I319 Disease of pericardium, unspecified: Secondary | ICD-10-CM | POA: Diagnosis present

## 2016-07-14 DIAGNOSIS — I639 Cerebral infarction, unspecified: Secondary | ICD-10-CM | POA: Diagnosis not present

## 2016-07-14 DIAGNOSIS — G319 Degenerative disease of nervous system, unspecified: Secondary | ICD-10-CM | POA: Diagnosis present

## 2016-07-14 DIAGNOSIS — R160 Hepatomegaly, not elsewhere classified: Secondary | ICD-10-CM | POA: Diagnosis present

## 2016-07-14 DIAGNOSIS — F419 Anxiety disorder, unspecified: Secondary | ICD-10-CM | POA: Diagnosis present

## 2016-07-14 DIAGNOSIS — G40909 Epilepsy, unspecified, not intractable, without status epilepticus: Secondary | ICD-10-CM | POA: Diagnosis present

## 2016-07-14 DIAGNOSIS — R918 Other nonspecific abnormal finding of lung field: Secondary | ICD-10-CM | POA: Diagnosis not present

## 2016-07-14 DIAGNOSIS — N393 Stress incontinence (female) (male): Secondary | ICD-10-CM | POA: Diagnosis present

## 2016-07-14 DIAGNOSIS — E785 Hyperlipidemia, unspecified: Secondary | ICD-10-CM | POA: Diagnosis present

## 2016-07-14 DIAGNOSIS — K219 Gastro-esophageal reflux disease without esophagitis: Secondary | ICD-10-CM | POA: Diagnosis present

## 2016-07-14 HISTORY — DX: Personal history of transient ischemic attack (TIA), and cerebral infarction without residual deficits: Z86.73

## 2016-07-14 LAB — CBC
HCT: 37.3 % (ref 36.0–46.0)
HEMOGLOBIN: 12.2 g/dL (ref 12.0–15.0)
MCH: 30.3 pg (ref 26.0–34.0)
MCHC: 32.7 g/dL (ref 30.0–36.0)
MCV: 92.6 fL (ref 78.0–100.0)
Platelets: 210 10*3/uL (ref 150–400)
RBC: 4.03 MIL/uL (ref 3.87–5.11)
RDW: 14.1 % (ref 11.5–15.5)
WBC: 15.9 10*3/uL — ABNORMAL HIGH (ref 4.0–10.5)

## 2016-07-14 LAB — COMPREHENSIVE METABOLIC PANEL
ALBUMIN: 3.2 g/dL — AB (ref 3.5–5.0)
ALT: 18 U/L (ref 14–54)
ANION GAP: 8 (ref 5–15)
AST: 47 U/L — ABNORMAL HIGH (ref 15–41)
Alkaline Phosphatase: 67 U/L (ref 38–126)
BUN: 16 mg/dL (ref 6–20)
CHLORIDE: 102 mmol/L (ref 101–111)
CO2: 25 mmol/L (ref 22–32)
Calcium: 9 mg/dL (ref 8.9–10.3)
Creatinine, Ser: 0.98 mg/dL (ref 0.44–1.00)
GFR calc non Af Amer: 53 mL/min — ABNORMAL LOW (ref >60)
GLUCOSE: 137 mg/dL — AB (ref 65–99)
POTASSIUM: 3.7 mmol/L (ref 3.5–5.1)
SODIUM: 135 mmol/L (ref 135–145)
Total Bilirubin: 0.5 mg/dL (ref 0.3–1.2)
Total Protein: 6 g/dL — ABNORMAL LOW (ref 6.5–8.1)

## 2016-07-14 MED ORDER — IOPAMIDOL (ISOVUE-300) INJECTION 61%
75.0000 mL | Freq: Once | INTRAVENOUS | Status: AC | PRN
  Administered 2016-07-14: 75 mL via INTRAVENOUS

## 2016-07-14 MED ORDER — GABAPENTIN 300 MG PO CAPS
600.0000 mg | ORAL_CAPSULE | Freq: Two times a day (BID) | ORAL | Status: DC
  Administered 2016-07-14 – 2016-07-15 (×3): 600 mg via ORAL
  Filled 2016-07-14 (×2): qty 2

## 2016-07-14 MED ORDER — STROKE: EARLY STAGES OF RECOVERY BOOK
Freq: Once | Status: AC
  Administered 2016-07-14: 13:00:00
  Filled 2016-07-14: qty 1

## 2016-07-14 MED ORDER — GABAPENTIN 300 MG PO CAPS
300.0000 mg | ORAL_CAPSULE | Freq: Every day | ORAL | Status: DC
  Administered 2016-07-15 – 2016-07-16 (×3): 300 mg via ORAL
  Filled 2016-07-14 (×4): qty 1

## 2016-07-14 MED ORDER — GADOBENATE DIMEGLUMINE 529 MG/ML IV SOLN
20.0000 mL | Freq: Once | INTRAVENOUS | Status: AC | PRN
  Administered 2016-07-14: 20 mL via INTRAVENOUS

## 2016-07-14 MED ORDER — ASPIRIN EC 81 MG PO TBEC
81.0000 mg | DELAYED_RELEASE_TABLET | Freq: Every day | ORAL | Status: DC
  Administered 2016-07-14 – 2016-07-16 (×3): 81 mg via ORAL
  Filled 2016-07-14 (×3): qty 1

## 2016-07-14 MED ORDER — LORAZEPAM 1 MG PO TABS
1.0000 mg | ORAL_TABLET | Freq: Once | ORAL | Status: AC
  Administered 2016-07-14: 1 mg via ORAL
  Filled 2016-07-14: qty 1

## 2016-07-14 NOTE — Progress Notes (Addendum)
Per patients husband patient has a vascular malformation in the side of her face and has been told by her physician never to take any type of blood thinners.  Explained that the blood thinner was beneficial when a patient has had a CVA.  Patient and husband still refusing.

## 2016-07-14 NOTE — Progress Notes (Signed)
PROGRESS NOTE    [name]  IRJ:[id number] DOB: 07-27-1934 DOA: 07/13/2016 PCP: Evelina Dun, FNP     Brief Narrative:  82 y/o woman admitted from home on 12/15 after a syncopal event.   Assessment & Plan:   Principal Problem:   Acute CVA (cerebrovascular accident) [facility]) Active Problems:   Cerebellar degeneration   Hypothyroidism   Paroxysmal atrial fibrillation ([name])   Atrial fibrillation ([name])   Syncope   Diarrhea   Volume depletion   Meningioma ([name])   Liver masses   Acute CVA -MRI with acute infarct of the right corpus callosum. -Start ASA, check lipids, ECHO, carotid dopplers. -PT/OT/ST consultations pending. -This likely explains her acute syncope.  Meningioma -Incidental finding. -no need for further work up.  Liver masses -Of unclear significance. -CT chest without major abnormalities. -This can be worked up as an OP and will likely need a liver biopsy in the future.  Hypothyroidism -Continue synthroid.  A Fib -Rate controlled. -Not good anticoagulation candidate given frequent falls and ataxia.   DVT prophylaxis: SQ heparin Code Status: full code Family Communication: husband at bedside updated on plan of care and questions answered Disposition Plan: pending PT evaluation  Consultants:   Neurology (requested)  Procedures:   None  Antimicrobials:  Anti-infectives    None       Subjective: Dysarthric speech, facial droop  Objective: Vitals:   07/14/16 0600 07/14/16 1200 07/14/16 1300 07/14/16 1500  BP: 112/62  (!) 112/58 (!) 119/57  Pulse: 75  87 81  Resp: '20  20 18  '$ Temp: 97.9 F (36.6 C) 98.4 F (36.9 C) 98.4 F (36.9 C) 98.5 F (36.9 C)  TempSrc: Oral  Oral Oral  SpO2: 93%   98%  Weight:      Height:        Intake/Output Summary (Last 24 hours) at 07/14/16 1721 Last data filed at 07/14/16 0955  Gross per 24 hour  Intake              420 ml  Output             1050 ml  Net             -630 ml   Filed  Weights   07/13/16 1153 07/13/16 2200  Weight: 99.8 kg (220 lb) 99.8 kg (220 lb)    Examination:  General exam: Alert, awake, oriented x 3 Respiratory system: Clear to auscultation. Respiratory effort normal. Cardiovascular system:RRR. No murmurs, rubs, gallops. Gastrointestinal system: Abdomen is nondistended, soft and nontender. No organomegaly or masses felt. Normal bowel sounds heard. Central nervous system: Alert and oriented but speech is difficult to understand. Extremities: No C/C/E, +pedal pulses Skin: No rashes, lesions or ulcers     Data Reviewed: I have personally reviewed following labs and imaging studies  CBC:  Recent Labs Lab 07/13/16 1214 07/14/16 0600  WBC 13.2* 15.9*  HGB 13.4 12.2  HCT 42.0 37.3  MCV 93.8 92.6  PLT 202 301   Basic Metabolic Panel:  Recent Labs Lab 07/13/16 1214 07/14/16 0600  NA 135 135  K 4.1 3.7  CL 102 102  CO2 25 25  GLUCOSE 140* 137*  BUN 13 16  CREATININE 1.04* 0.98  CALCIUM 9.8 9.0   GFR: Estimated Creatinine Clearance: 52.5 mL/min (by C-G formula based on SCr of 0.98 mg/dL). Liver Function Tests:  Recent Labs Lab 07/14/16 0600  AST 47*  ALT 18  ALKPHOS 67  BILITOT 0.5  PROT 6.0*  ALBUMIN  3.2*   No results for input(s): LIPASE, AMYLASE in the last 168 hours. No results for input(s): AMMONIA in the last 168 hours. Coagulation Profile: No results for input(s): INR, PROTIME in the last 168 hours. Cardiac Enzymes:  Recent Labs Lab 07/13/16 1231  CKTOTAL 34*   BNP (last 3 results) No results for input(s): PROBNP in the last 8760 hours. HbA1C: No results for input(s): HGBA1C in the last 72 hours. CBG:  Recent Labs Lab 07/13/16 1248 07/13/16 1321  GLUCAP 138* 94   Lipid Profile: No results for input(s): CHOL, HDL, LDLCALC, TRIG, CHOLHDL, LDLDIRECT in the last 72 hours. Thyroid Function Tests:  Recent Labs  07/13/16 1322  TSH 2.224   Anemia Panel: No results for input(s): VITAMINB12,  FOLATE, FERRITIN, TIBC, IRON, RETICCTPCT in the last 72 hours. Urine analysis:    Component Value Date/Time   COLORURINE YELLOW 07/13/2016 1919   APPEARANCEUR HAZY (A) 07/13/2016 1919   APPEARANCEUR Clear 02/17/2016 1707   LABSPEC 1.040 (H) 07/13/2016 1919   PHURINE 5.0 07/13/2016 1919   GLUCOSEU NEGATIVE 07/13/2016 1919   HGBUR NEGATIVE 07/13/2016 1919   BILIRUBINUR NEGATIVE 07/13/2016 1919   BILIRUBINUR Negative 02/17/2016 1707   [place] 07/13/2016 1919   PROTEINUR NEGATIVE 07/13/2016 1919   UROBILINOGEN negative 05/24/2015 1434   NITRITE NEGATIVE 07/13/2016 1919   LEUKOCYTESUR NEGATIVE 07/13/2016 1919   LEUKOCYTESUR Negative 02/17/2016 1707   Sepsis Labs: '@LABRCNTIP'$ (procalcitonin:4,lacticidven:4)  )No results found for this or any previous visit (from the past 240 hour(s)).       Radiology Studies: Ct Head Wo Contrast  Addendum Date: 07/13/2016   ADDENDUM REPORT: 07/13/2016 18:11 ADDENDUM: These results were called by telephone at the time of interpretation on 07/13/2016 at 2:50 pm to Dr. Sabra Heck, who verbally acknowledged these results. Electronically Signed   By: Fidela Salisbury M.D.   On: 07/13/2016 18:11   Result Date: 07/13/2016 CLINICAL DATA:  Generalized pain and weakness.  Diarrhea. EXAM: CT HEAD WITHOUT CONTRAST TECHNIQUE: Contiguous axial images were obtained from the base of the skull through the vertex without intravenous contrast. COMPARISON:  Head MRI 06/07/2015 FINDINGS: Brain: No evidence of acute infarction, or hydrocephalus. There is a 10 mm rounded hyperdense structure abutting the paramedian right occipital cortex along the tentorium. Prominent areas of hypoattenuation within the cortex of bilateral cerebellum are stable, accounting for differences in technique compared to the brain MRI from November 2016. There is moderate deep white matter microangiopathy and mild brain parenchymal volume loss. Vascular: No hyperdense vessels. Skull: Normal.  Negative for fracture or focal lesion. Sinuses/Orbits: No acute finding. Other: None. IMPRESSION: No evidence of acute infarction. 10 mm rounded hyperdense structure abutting the paramedian right occipital cortex along the tentorium. This may represent a small amount of subdural hemorrhage, or malformation of the straight sinus. Further evaluation with brain MRI may be considered if found clinically relevant. Moderate chronic microvascular disease. Areas of hypoattenuation in bilateral cerebellar cortex, likely sequela of prior ischemia. Electronically Signed: By: Fidela Salisbury M.D. On: 07/13/2016 14:46   Ct Chest W Contrast  Result Date: 07/14/2016 CLINICAL DATA:  Followup left lung nodules seen on an abdomen CT yesterday. Suspected liver metastases on the CT. EXAM: CT CHEST WITH CONTRAST TECHNIQUE: Multidetector CT imaging of the chest was performed during intravenous contrast administration. CONTRAST:  48m ISOVUE-300 IOPAMIDOL (ISOVUE-300) INJECTION 61% COMPARISON:  Abdomen and pelvis CT obtained yesterday. Previous chest CT examinations dated 05/10/2015, 12/30/2013 and 10/27/2013. FINDINGS: Cardiovascular: Coronary artery and aortic calcifications. Mildly enlarged heart.  Mediastinum/Nodes: No enlarged lymph nodes. Lungs/Pleura: The 5 mm nodule seen in the medial aspect of the left lower lobe yesterday measures 5 mm on image number 71 of series 3 today. The previously demonstrated 4 mm nodule in the left lower lobe is currently obscured by atelectasis. There is interval mild bilateral lower lobe atelectasis as well as mild dependent atelectasis in the left upper lobe. The 1.1 x 0.9 cm right middle lobe nodule seen on 05/10/2015 and on previous examinations is difficult to measure today due to interval associated atelectasis extending more medially. The nodule itself measures approximately 1.3 x 1.0 cm on image number 37 of series 3. The 2 left lower lobe nodules seen yesterday are unchanged since  10/27/2013. No pleural fluid. Upper Abdomen: The liver masses seen yesterday are again demonstrated. These are less well visualized due to later phase imaging. Musculoskeletal: Mild thoracic spine degenerative changes. Old, healed sternal fracture. No evidence of bony metastatic disease. IMPRESSION: 1. The size of the previously demonstrated right middle lobe nodule is difficult to assess due to interval mild atelectasis medially. This may have increased minimally in size since 05/10/2015 and is indeterminate for the possibility of malignancy. 2. No significant change in an the recently demonstrated left lung nodules since 10/27/2013, compatible with a benign process. 3. Stable liver masses suspicious for metastases. Electronically Signed   By: Claudie Revering M.D.   On: 07/14/2016 11:31   Mr Jeri Cos GE Contrast  Result Date: 07/14/2016 CLINICAL DATA:  Generalized weakness.  Abnormal head CT. EXAM: MRI HEAD WITHOUT AND WITH CONTRAST TECHNIQUE: Multiplanar, multiecho pulse sequences of the brain and surrounding structures were obtained without and with intravenous contrast. CONTRAST:  15m MULTIHANCE GADOBENATE DIMEGLUMINE 529 MG/ML IV SOLN COMPARISON:  Head CT 07/13/2016.  MRI 06/07/2015. FINDINGS: Brain: Diffusion imaging shows a 4 mm acute infarction at the genu of the internal capsule on the right. No other acute infarction. There are extensive old infarctions throughout the cerebellum. Chronic small-vessel ischemic changes affect the pons. Cerebral hemispheres show extensive chronic small-vessel ischemic changes throughout the deep and subcortical white matter. No evidence of intra-axial mass lesion, hemorrhage, hydrocephalus or extra-axial fluid collection. There is a 9 mm meningioma along the right side of the falx tentorial junction responsible for of the CT finding. No evidence of of venous invasion at this time. Mass-effect on the hand. Not present on the previous studies. Vascular: Major vessels at the  base of the brain show flow. Skull and upper cervical spine: Negative Sinuses/Orbits: Clear/normal Other: None significant IMPRESSION: 4 mm acute infarction in the genu of the corpus callosum on the right. Extensive chronic small vessel ischemic changes throughout brain. Numerous old cerebellar infarctions. 9 mm meningioma on the right at the junction of the falx and tentorium, newly seen since last year, not significant at this time. Electronically Signed   By: MNelson ChimesM.D.   On: 07/14/2016 11:09   Ct Abdomen Pelvis W Contrast  Result Date: 07/13/2016 CLINICAL DATA:  Abdominal pain and generalized weakness and thick thick and this morning. Diarrhea. EXAM: CT ABDOMEN AND PELVIS WITH CONTRAST TECHNIQUE: Multidetector CT imaging of the abdomen and pelvis was performed using the standard protocol following bolus administration of intravenous contrast. CONTRAST:  393mISOVUE-300 IOPAMIDOL (ISOVUE-300) INJECTION 61%, 10058mSOVUE-300 IOPAMIDOL (ISOVUE-300) INJECTION 61% COMPARISON:  CT scan 02/19/2008 FINDINGS: Lower chest: The lung bases are clear of acute process. No infiltrates or effusions. There is a 5 mm pulmonary nodule in the left lower lobe  on image number 12 and a 4 mm left lower lobe pulmonary nodule on image number 6. I believe both pulmonary nodules were present on the prior CT and not changed. Stable coronary artery calcifications. The heart is normal in size. No pericardial effusion. The distal esophagus is grossly normal. Moderate atherosclerotic calcifications involving the distal descending thoracic aorta. Hepatobiliary: There are ill-defined liver lesions which are worrisome for new hepatic metastatic disease. The largest lesion is in segment 8 and measures 2.5 cm on image 20. There is also a 2.2 cm segment 3 lesion on image number 23. New line no intrahepatic biliary dilatation. The gallbladder is normal. No common bile duct dilatation. Pancreas: No mass, inflammation or ductal dilatation.  Moderate fatty change noted in the pancreatic head. Spleen: Normal size.  No focal lesions. Adrenals/Urinary Tract: The adrenal glands and kidneys are unremarkable. Fetal lobulations versus remote scarring changes. A lower pole left renal calculus is noted. Stomach/Bowel: The stomach, duodenum, small bowel and colon are grossly normal. There is mild wall thickening and submucosal edema involving the transverse and descending colon suggesting colitis and likely response for the patient's diarrhea. I do not see a definite colon cancer to account for liver lesions. The terminal ileum is normal. Vascular/Lymphatic: Advanced atherosclerotic calcifications involving the aorta and branch vessel ostia. No mesenteric or retroperitoneal mass or adenopathy. The major venous structures are patent. Reproductive: The uterus is surgically absent. The right ovary is still present and appears normal. I do not see the left ovary for certain. Other: Bladder is normal. No pelvic mass or adenopathy. No free pelvic fluid collections. No inguinal mass or adenopathy. No abdominal wall hernia or subcutaneous lesions. Musculoskeletal: No significant bony findings. IMPRESSION: 1. Stable left pulmonary nodules. 2. The hepatic lesions are worrisome for metastatic disease. No obvious primary tumor is identified. Specifically no obvious gastric, pancreatic or colon cancer. Chest CT may be helpful to exclude lung cancer or breast cancer. 3. Liver lesions should be amenable to image guided biopsy if clinically necessary. 4. Suspect transverse and descending colon colitis likely accounting for the patient's diarrhea. Electronically Signed   By: Marijo Sanes M.D.   On: 07/13/2016 14:45   Ct Hip Left Wo Contrast  Result Date: 07/13/2016 CLINICAL DATA:  Complains of generalized pain worse in the legs sharp intermittent pain to left hip EXAM: CT OF THE LEFT HIP WITHOUT CONTRAST TECHNIQUE: Multidetector CT imaging of the left hip was performed  according to the standard protocol. Multiplanar CT image reconstructions were also generated. COMPARISON:  07/13/2016, 05/07/2014 FINDINGS: Bones/Joint/Cartilage No acute displaced fracture or dislocation is evident. Joint space compartment appears relatively maintained. Pubic symphysis and rami appear intact. Proximal femur without fracture. Tiny sclerotic focus left ischium possible bone island, no lytic lesions are visualized. Ligaments Suboptimally assessed by CT. Muscles and Tendons Mild fatty atrophy of the left hip musculature. No soft tissue mass. Soft tissues Mild left femoral artery calcification. No significant hip effusion. Residual contrast material within the bladder. IMPRESSION: No acute osseous abnormality. No significant hip effusion or suspicious bone lesion. Residual contrast within the bladder. Electronically Signed   By: Donavan Foil M.D.   On: 07/13/2016 20:07   Dg Hip Unilat W Or Wo Pelvis 2-3 Views Left  Result Date: 07/13/2016 CLINICAL DATA:  Left hip pain.  Fall. EXAM: DG HIP (WITH OR WITHOUT PELVIS) 2-3V LEFT COMPARISON:  CT abdomen and pelvis performed earlier today.  The FINDINGS: Early symmetric degenerative changes in the hips bilaterally. SI joints are  symmetric and unremarkable. No acute bony abnormality. Specifically, no fracture, subluxation, or dislocation. Soft tissues are intact. Oral contrast material noted within bowel loops in the right lower quadrant. Contrast material noted within the bladder. IMPRESSION: No acute bony abnormality. Electronically Signed   By: Rolm Baptise M.D.   On: 07/13/2016 18:16        Scheduled Meds: . amitriptyline  25 mg Oral QHS  . amLODipine  5 mg Oral Daily  . escitalopram  20 mg Oral QHS  . heparin  5,000 Units Subcutaneous Q8H  . levothyroxine  75 mcg Oral QAC breakfast  . pantoprazole  40 mg Oral Daily  . pravastatin  40 mg Oral q1800  . traZODone  150 mg Oral QHS   Continuous Infusions: . dextrose 5 % and 0.9% NaCl 75  mL/hr at 07/14/16 1458     LOS: 0 days    Time spent: 25 minutes. Greater than 50% of this time was spent in direct contact with the patient coordinating care.     Lelon Frohlich, MD [facility] Pager [phone]  If 7PM-7AM, please contact night-coverage [email or web address] Password [email or web address] 07/14/2016, 5:21 PM

## 2016-07-15 ENCOUNTER — Inpatient Hospital Stay (HOSPITAL_COMMUNITY): Payer: Medicare Other

## 2016-07-15 DIAGNOSIS — I6350 Cerebral infarction due to unspecified occlusion or stenosis of unspecified cerebral artery: Secondary | ICD-10-CM

## 2016-07-15 LAB — ECHOCARDIOGRAM COMPLETE
Height: 64 in
WEIGHTICAEL: 3520 [oz_av]

## 2016-07-15 LAB — LIPID PANEL
CHOLESTEROL: 124 mg/dL (ref 0–200)
HDL: 50 mg/dL (ref >40)
LDL CALC: 61 mg/dL (ref 0–99)
Total CHOL/HDL Ratio: 2.5 RATIO
Triglycerides: 65 mg/dL (ref <150)
VLDL: 13 mg/dL (ref 0–40)

## 2016-07-15 MED ORDER — BENZONATATE 100 MG PO CAPS
200.0000 mg | ORAL_CAPSULE | Freq: Two times a day (BID) | ORAL | Status: DC | PRN
  Administered 2016-07-16 – 2016-07-18 (×2): 200 mg via ORAL
  Filled 2016-07-15 (×2): qty 2

## 2016-07-15 MED ORDER — BENZONATATE 100 MG PO CAPS: 200.0000 mg | ORAL_CAPSULE | Freq: Three times a day (TID) | ORAL | Status: DC | PRN

## 2016-07-15 NOTE — Progress Notes (Signed)
PROGRESS NOTE    [name]  IHK:[id number] DOB: 06/18/1934 DOA: 07/13/2016 PCP: Evelina Dun, FNP     Brief Narrative:  82 y/o woman admitted from home on 12/15 after a syncopal event.   Assessment & Plan:   Principal Problem:   Acute CVA (cerebrovascular accident) [facility]) Active Problems:   Cerebellar degeneration   Hypothyroidism   Paroxysmal atrial fibrillation ([name])   Atrial fibrillation ([name])   Syncope   Diarrhea   Volume depletion   Meningioma ([name])   Liver masses   Acute CVA -MRI with acute infarct of the right corpus callosum. -Start ASA (patient refuses to take because of ?hemangioma of face), LDL 61, ECHO pending, carotid dopplers pending. -PT recs SNF, but patient and husband would prefer home. He is willing to provide 24 hour supervision. -OT/ST consultations pending. -Neurology consult pending. -This likely explains her acute syncope.  Meningioma -Incidental finding. -no need for further work up.  Liver masses -Of unclear significance. -CT chest without major abnormalities. -This can be worked up as an OP and will likely need a liver biopsy in the future.  Hypothyroidism -Continue synthroid.  A Fib -Rate controlled. -Not good anticoagulation candidate given frequent falls and ataxia.   DVT prophylaxis: SQ heparin Code Status: full code Family Communication: husband at bedside updated on plan of care and questions answered Disposition Plan: pending PT evaluation  Consultants:   Neurology (requested)  Procedures:   None  Antimicrobials:  Anti-infectives    None       Subjective: Dysarthric speech, facial droop  Objective: Vitals:   07/15/16 0100 07/15/16 0500 07/15/16 0900 07/15/16 1355  BP: (!) 120/58 114/63 99/69 (!) 103/48  Pulse: 86 95 99 62  Resp: '20 18 20 20  '$ Temp: 98.7 F (37.1 C) 99.5 F (37.5 C) 99.3 F (37.4 C) 98.8 F (37.1 C)  TempSrc: Oral Oral Oral Oral  SpO2: 98% 97% 99% 95%  Weight:      Height:         Intake/Output Summary (Last 24 hours) at 07/15/16 1607 Last data filed at 07/15/16 1125  Gross per 24 hour  Intake           2422.5 ml  Output              725 ml  Net           1697.5 ml   Filed Weights   07/13/16 1153 07/13/16 2200  Weight: 99.8 kg (220 lb) 99.8 kg (220 lb)    Examination:  General exam: Alert, awake, oriented x 3 Respiratory system: Clear to auscultation. Respiratory effort normal. Cardiovascular system:RRR. No murmurs, rubs, gallops. Gastrointestinal system: Abdomen is nondistended, soft and nontender. No organomegaly or masses felt. Normal bowel sounds heard. Central nervous system: Alert and oriented but speech is difficult to understand. Extremities: No C/C/E, +pedal pulses Skin: No rashes, lesions or ulcers     Data Reviewed: I have personally reviewed following labs and imaging studies  CBC:  Recent Labs Lab 07/13/16 1214 07/14/16 0600  WBC 13.2* 15.9*  HGB 13.4 12.2  HCT 42.0 37.3  MCV 93.8 92.6  PLT 202 756   Basic Metabolic Panel:  Recent Labs Lab 07/13/16 1214 07/14/16 0600  NA 135 135  K 4.1 3.7  CL 102 102  CO2 25 25  GLUCOSE 140* 137*  BUN 13 16  CREATININE 1.04* 0.98  CALCIUM 9.8 9.0   GFR: Estimated Creatinine Clearance: 52.5 mL/min (by C-G formula based on SCr of  0.98 mg/dL). Liver Function Tests:  Recent Labs Lab 07/14/16 0600  AST 47*  ALT 18  ALKPHOS 67  BILITOT 0.5  PROT 6.0*  ALBUMIN 3.2*   No results for input(s): LIPASE, AMYLASE in the last 168 hours. No results for input(s): AMMONIA in the last 168 hours. Coagulation Profile: No results for input(s): INR, PROTIME in the last 168 hours. Cardiac Enzymes:  Recent Labs Lab 07/13/16 1231  CKTOTAL 34*   BNP (last 3 results) No results for input(s): PROBNP in the last 8760 hours. HbA1C: No results for input(s): HGBA1C in the last 72 hours. CBG:  Recent Labs Lab 07/13/16 1248 07/13/16 1321  GLUCAP 138* 94   Lipid Profile:  Recent  Labs  07/15/16 0444  CHOL 124  HDL 50  LDLCALC 61  TRIG 65  CHOLHDL 2.5   Thyroid Function Tests:  Recent Labs  07/13/16 1322  TSH 2.224   Anemia Panel: No results for input(s): VITAMINB12, FOLATE, FERRITIN, TIBC, IRON, RETICCTPCT in the last 72 hours. Urine analysis:    Component Value Date/Time   COLORURINE YELLOW 07/13/2016 1919   APPEARANCEUR HAZY (A) 07/13/2016 1919   APPEARANCEUR Clear 02/17/2016 1707   LABSPEC 1.040 (H) 07/13/2016 1919   PHURINE 5.0 07/13/2016 1919   GLUCOSEU NEGATIVE 07/13/2016 1919   HGBUR NEGATIVE 07/13/2016 1919   BILIRUBINUR NEGATIVE 07/13/2016 1919   BILIRUBINUR Negative 02/17/2016 1707   [place] 07/13/2016 1919   PROTEINUR NEGATIVE 07/13/2016 1919   UROBILINOGEN negative 05/24/2015 1434   NITRITE NEGATIVE 07/13/2016 1919   LEUKOCYTESUR NEGATIVE 07/13/2016 1919   LEUKOCYTESUR Negative 02/17/2016 1707   Sepsis Labs: '@LABRCNTIP'$ (procalcitonin:4,lacticidven:4)  )No results found for this or any previous visit (from the past 240 hour(s)).       Radiology Studies: Ct Chest W Contrast  Result Date: 07/14/2016 CLINICAL DATA:  Followup left lung nodules seen on an abdomen CT yesterday. Suspected liver metastases on the CT. EXAM: CT CHEST WITH CONTRAST TECHNIQUE: Multidetector CT imaging of the chest was performed during intravenous contrast administration. CONTRAST:  32m ISOVUE-300 IOPAMIDOL (ISOVUE-300) INJECTION 61% COMPARISON:  Abdomen and pelvis CT obtained yesterday. Previous chest CT examinations dated 05/10/2015, 12/30/2013 and 10/27/2013. FINDINGS: Cardiovascular: Coronary artery and aortic calcifications. Mildly enlarged heart. Mediastinum/Nodes: No enlarged lymph nodes. Lungs/Pleura: The 5 mm nodule seen in the medial aspect of the left lower lobe yesterday measures 5 mm on image number 71 of series 3 today. The previously demonstrated 4 mm nodule in the left lower lobe is currently obscured by atelectasis. There is interval  mild bilateral lower lobe atelectasis as well as mild dependent atelectasis in the left upper lobe. The 1.1 x 0.9 cm right middle lobe nodule seen on 05/10/2015 and on previous examinations is difficult to measure today due to interval associated atelectasis extending more medially. The nodule itself measures approximately 1.3 x 1.0 cm on image number 37 of series 3. The 2 left lower lobe nodules seen yesterday are unchanged since 10/27/2013. No pleural fluid. Upper Abdomen: The liver masses seen yesterday are again demonstrated. These are less well visualized due to later phase imaging. Musculoskeletal: Mild thoracic spine degenerative changes. Old, healed sternal fracture. No evidence of bony metastatic disease. IMPRESSION: 1. The size of the previously demonstrated right middle lobe nodule is difficult to assess due to interval mild atelectasis medially. This may have increased minimally in size since 05/10/2015 and is indeterminate for the possibility of malignancy. 2. No significant change in an the recently demonstrated left lung nodules since 10/27/2013,  compatible with a benign process. 3. Stable liver masses suspicious for metastases. Electronically Signed   By: Claudie Revering M.D.   On: 07/14/2016 11:31   Mr Jeri Cos UJ Contrast  Result Date: 07/14/2016 CLINICAL DATA:  Generalized weakness.  Abnormal head CT. EXAM: MRI HEAD WITHOUT AND WITH CONTRAST TECHNIQUE: Multiplanar, multiecho pulse sequences of the brain and surrounding structures were obtained without and with intravenous contrast. CONTRAST:  59m MULTIHANCE GADOBENATE DIMEGLUMINE 529 MG/ML IV SOLN COMPARISON:  Head CT 07/13/2016.  MRI 06/07/2015. FINDINGS: Brain: Diffusion imaging shows a 4 mm acute infarction at the genu of the internal capsule on the right. No other acute infarction. There are extensive old infarctions throughout the cerebellum. Chronic small-vessel ischemic changes affect the pons. Cerebral hemispheres show extensive chronic  small-vessel ischemic changes throughout the deep and subcortical white matter. No evidence of intra-axial mass lesion, hemorrhage, hydrocephalus or extra-axial fluid collection. There is a 9 mm meningioma along the right side of the falx tentorial junction responsible for of the CT finding. No evidence of of venous invasion at this time. Mass-effect on the hand. Not present on the previous studies. Vascular: Major vessels at the base of the brain show flow. Skull and upper cervical spine: Negative Sinuses/Orbits: Clear/normal Other: None significant IMPRESSION: 4 mm acute infarction in the genu of the corpus callosum on the right. Extensive chronic small vessel ischemic changes throughout brain. Numerous old cerebellar infarctions. 9 mm meningioma on the right at the junction of the falx and tentorium, newly seen since last year, not significant at this time. Electronically Signed   By: MNelson ChimesM.D.   On: 07/14/2016 11:09   Ct Hip Left Wo Contrast  Result Date: 07/13/2016 CLINICAL DATA:  Complains of generalized pain worse in the legs sharp intermittent pain to left hip EXAM: CT OF THE LEFT HIP WITHOUT CONTRAST TECHNIQUE: Multidetector CT imaging of the left hip was performed according to the standard protocol. Multiplanar CT image reconstructions were also generated. COMPARISON:  07/13/2016, 05/07/2014 FINDINGS: Bones/Joint/Cartilage No acute displaced fracture or dislocation is evident. Joint space compartment appears relatively maintained. Pubic symphysis and rami appear intact. Proximal femur without fracture. Tiny sclerotic focus left ischium possible bone island, no lytic lesions are visualized. Ligaments Suboptimally assessed by CT. Muscles and Tendons Mild fatty atrophy of the left hip musculature. No soft tissue mass. Soft tissues Mild left femoral artery calcification. No significant hip effusion. Residual contrast material within the bladder. IMPRESSION: No acute osseous abnormality. No  significant hip effusion or suspicious bone lesion. Residual contrast within the bladder. Electronically Signed   By: KDonavan FoilM.D.   On: 07/13/2016 20:07   Dg Hip Unilat W Or Wo Pelvis 2-3 Views Left  Result Date: 07/13/2016 CLINICAL DATA:  Left hip pain.  Fall. EXAM: DG HIP (WITH OR WITHOUT PELVIS) 2-3V LEFT COMPARISON:  CT abdomen and pelvis performed earlier today.  The FINDINGS: Early symmetric degenerative changes in the hips bilaterally. SI joints are symmetric and unremarkable. No acute bony abnormality. Specifically, no fracture, subluxation, or dislocation. Soft tissues are intact. Oral contrast material noted within bowel loops in the right lower quadrant. Contrast material noted within the bladder. IMPRESSION: No acute bony abnormality. Electronically Signed   By: KRolm BaptiseM.D.   On: 07/13/2016 18:16        Scheduled Meds: . amitriptyline  25 mg Oral QHS  . amLODipine  5 mg Oral Daily  . aspirin EC  81 mg Oral Daily  . escitalopram  20 mg Oral QHS  . gabapentin  300 mg Oral Q1200  . gabapentin  600 mg Oral BID AC & HS  . heparin  5,000 Units Subcutaneous Q8H  . levothyroxine  75 mcg Oral QAC breakfast  . pantoprazole  40 mg Oral Daily  . pravastatin  40 mg Oral q1800  . traZODone  150 mg Oral QHS   Continuous Infusions: . dextrose 5 % and 0.9% NaCl 75 mL/hr at 07/15/16 0319     LOS: 1 day    Time spent: 25 minutes. Greater than 50% of this time was spent in direct contact with the patient coordinating care.     Lelon Frohlich, MD [facility] Pager [phone]  If 7PM-7AM, please contact night-coverage [email or web address] Password [email or web address] 07/15/2016, 4:07 PM

## 2016-07-15 NOTE — Evaluation (Addendum)
Physical Therapy Evaluation Patient Details Name: [name] MRN: [id number] DOB: 11/17/1933 Today's Date: 07/15/2016   History of Present Illness  82 y.o. female with hx of cerebella degenration, HLD, HTN, hypothyrodism, CVA, TIA, Ataxia, PAF not on anticoagulation due to ataxia and fall risk, presented to the ER falling and fainted after having diarrhea trying to get up.  Evaluation in the ER included a head CT which showed no acute infarction, but a 10 mm rounded hyperdense structure abutting the paramedian right occipital cortex along the tentorium. This may represent a small amount of subdural hemorrhage, or malformation of the straight sinus. She also has moderate chronic microvascular disease. Areas of hypoattenuation in bilateral cerebellar cortex, likely sequela of prior ischemia.  She also complained of lower leg pain and lower abdominal pain.  Abdominal pelvic CT was done, showing hepatic lesions which were new and suspicous for metatastic lesion.  Radiologist recommended chest CT to look for metastatic source.  She also complained of pain in her legs, originally both legs, but it seems that her pain is mostly on the left hip.  Her serology showed WBC of 13.2 K, and normal Cr.  No LFT was done.  Hospitalist was asked to admit her for syncope, felt to be due to volume depletion because of diarrhea, and leg and hip pain, unable to ambulate.  MRI with acute infarct of the right corpus callosum.  Dx: CVA    Clinical Impression  Pt received in bed, and was agreeable to PT evaluation.  Husband arrived at the end of the evaluation and confirmed information.  Pt expressed that she was able to perform transfers into her w/c independently without using a RW prior to admission, and she was independent with dressing and bathing.  She mobilizes in her w/c due to mobility deficits at baseline.  During PT evaluation, she required Mod A for supine<>sit, and Max A for SPT bed<>chair, Max A for SPT chair<>BSC,  and max/Total A for transfer BSC<>chair with RW.  She is unable to stand upright, and demonstrates 90* flexion at the hips during all transfers.  She has had multiple falls at home, and is at high risk for recurrent falls.  She is strongly recommended for SNF, however, both patient and husband express they are not interested in SNF due to pt's sister having a bad experience recently at one.  Patient and husband wish to d/c home with HHPT, and she will need a hospital bed and a hoyer lift for safety with transfers.  Also recommend an SLP consult for cognition and language due to multiple inappropriate word substitutions during evaluation.      Follow Up Recommendations SNF;Home health PT;Supervision/Assistance - 24 hour    Equipment Recommendations  Hospital bed;Other (comment) (hoyer lift if pt ends up going home. )    Recommendations for Other Services Speech consult;Other (comment) (cognitive/linguistic evaluation - pt is substituting words)     Precautions / Restrictions Precautions Precautions: Fall Precaution Comments: 6-8 falls in the last 6 months.  2 yesterday when transferring from the bed<>w/c.  Restrictions Weight Bearing Restrictions: No      Mobility  Bed Mobility Overal bed mobility: Needs Assistance Bed Mobility: Supine to Sit     Supine to sit: Mod assist;HOB elevated     General bed mobility comments: increased time with use of bed pad to scoot hips to the EOB.   Transfers Overall transfer level: Needs assistance   Transfers: Stand Pivot Transfers;Sit to/from Stand Sit to Stand: Mod  assist Stand pivot transfers: Max assist       General transfer comment: Pt was assisted with transfer going to the right from bed<>chair.  Pt then expressed that she needed to have a BM, therefore pt was assisted with transfer chair<>BSC going to the right.  Pt had a small liquid BM.  Total A for hygiene with pt standing with RW.  Pt is unable to stand upright, and demonstrates 90*  flexion at the hips with anterior LOB.  After pt transferred back to the chair, discussed need to get posture more upright to improve balance, but pt states she needs to lean forward to get her balance.    Ambulation/Gait                Stairs            Wheelchair Mobility    Modified Rankin (Stroke Patients Only)       Balance Overall balance assessment: History of Falls;Needs assistance Sitting-balance support: Bilateral upper extremity supported;Feet supported Sitting balance-Leahy Scale: Poor Sitting balance - Comments: Pt demonstrates chorea like movement at times when sitting on the EOB, and requires support for her trunk to maintain static sitting balance.     Standing balance support: Bilateral upper extremity supported Standing balance-Leahy Scale: Zero Standing balance comment: Pt is unable to stand upright, and demonstrates 90* flexion at the hips, which causes anterior LOB.                               Pertinent Vitals/Pain Pain Assessment: 0-10 Pain Score: 5  Pain Location: L shoulder and arm.   Pain Descriptors / Indicators: Aching Pain Intervention(s): Limited activity within patient's tolerance;Monitored during session;Repositioned    Home Living   Living Arrangements: Spouse/significant other Available Help at Discharge: Available 24 hours/day Type of Home: House Home Access: Ramped entrance     Home Layout: Multi-level;Able to live on main level with bedroom/bathroom Home Equipment: Grab bars - tub/shower;Walker - 2 wheels;Walker - 4 wheels;Bedside commode;Wheelchair - manual;Shower seat      Prior Function Level of Independence: Independent with assistive device(s)   Gait / Transfers Assistance Needed: Pt mobilizes in the w/c.  Pt places the w/c in front of her and turns 180* to transfer into her w/c.  Pt states she is independent with this transfer.   ADL's / Homemaking Assistance Needed: independent with dressing and  bathing.  Husband runs errands and goes grocery shopping.          Hand Dominance   Dominant Hand: Right    Extremity/Trunk Assessment   Upper Extremity Assessment Upper Extremity Assessment: Generalized weakness    Lower Extremity Assessment Lower Extremity Assessment: Generalized weakness       Communication      Cognition Arousal/Alertness: Awake/alert Behavior During Therapy: WFL for tasks assessed/performed Overall Cognitive Status: Impaired/Different from baseline Area of Impairment: Orientation Orientation Level: Disoriented to;Situation (thinks she is here for a broken rib.)             General Comments: During conversation, pt frequently uses in appropriate substitution of words - ie: says "lawnmower" instead of w/c, or "walker" instead of w/c, "colonoscopy" instead of BM.      General Comments      Exercises     Assessment/Plan    PT Assessment Patient needs continued PT services  PT Problem List Decreased strength;Decreased activity tolerance;Decreased balance;Decreased mobility;Decreased coordination;Decreased cognition;Decreased knowledge of use  of DME;Decreased safety awareness;Decreased knowledge of precautions;Obesity;Decreased skin integrity          PT Treatment Interventions DME instruction;Functional mobility training;Therapeutic activities;Therapeutic exercise;Balance training;Neuromuscular re-education;Patient/family education;Wheelchair mobility training    PT Goals (Current goals can be found in the Care Plan section)  Acute Rehab PT Goals Patient Stated Goal: To go home.  PT Goal Formulation: With patient/family Time For Goal Achievement: 07/22/16 Potential to Achieve Goals: Fair    Frequency 7X/week   Barriers to discharge        Co-evaluation               End of Session Equipment Utilized During Treatment: Gait belt Activity Tolerance: Patient limited by fatigue Patient left: in chair;with call bell/phone within  reach;with family/visitor present Nurse Communication: Mobility status Velta Addison notified of pt's mobility status, and location.  Maxi Move sling under pt in the chair, and recommended to use for transfers.   Mobility sheet in the room. )    Functional Assessment Tool Used: [employer]	[place] AM-PAC "6-clicks"  Functional Limitation: Mobility: Walking and moving around Mobility: Walking and Moving Around Current Status [phone]): At least 60 percent but less than 80 percent impaired, limited or restricted Mobility: Walking and Moving Around Goal Status [phone]): At least 40 percent but less than 60 percent impaired, limited or restricted    Time: 1594-5859 PT Time Calculation (min) (ACUTE ONLY): 50 min   Charges:   PT Evaluation $PT Eval Moderate Complexity: 1 Procedure PT Treatments $Therapeutic Activity: 8-22 mins $Self Care/Home Management: 8-22   PT G Codes:   PT G-Codes **NOT FOR INPATIENT CLASS** Functional Assessment Tool Used: [employer] "6-clicks"  Functional Limitation: Mobility: Walking and moving around Mobility: Walking and Moving Around Current Status [phone]): At least 60 percent but less than 80 percent impaired, limited or restricted Mobility: Walking and Moving Around Goal Status [phone]): At least 40 percent but less than 60 percent impaired, limited or restricted    Beth Hidaya Daniel, PT, DPT X: [phone]

## 2016-07-15 NOTE — CV Procedure (Signed)
ECHO DONE 

## 2016-07-16 DIAGNOSIS — I639 Cerebral infarction, unspecified: Principal | ICD-10-CM

## 2016-07-16 DIAGNOSIS — R270 Ataxia, unspecified: Secondary | ICD-10-CM | POA: Diagnosis not present

## 2016-07-16 LAB — GASTROINTESTINAL PANEL BY PCR, STOOL (REPLACES STOOL CULTURE)

## 2016-07-16 LAB — HEMOGLOBIN A1C
HEMOGLOBIN A1C: 5.7 % — AB (ref 4.8–5.6)
MEAN PLASMA GLUCOSE: 117 mg/dL

## 2016-07-16 LAB — BASIC METABOLIC PANEL
Anion gap: 6 (ref 5–15)
BUN: 13 mg/dL (ref 6–20)
CALCIUM: 8.8 mg/dL — AB (ref 8.9–10.3)
CHLORIDE: 105 mmol/L (ref 101–111)
CO2: 24 mmol/L (ref 22–32)
Creatinine, Ser: 0.84 mg/dL (ref 0.44–1.00)
GFR calc non Af Amer: >60 mL/min (ref >60)
GLUCOSE: 148 mg/dL — AB (ref 65–99)
Potassium: 3.6 mmol/L (ref 3.5–5.1)
Sodium: 135 mmol/L (ref 135–145)

## 2016-07-16 LAB — CBC
HEMATOCRIT: 35.6 % — AB (ref 36.0–46.0)
HEMOGLOBIN: 11.7 g/dL — AB (ref 12.0–15.0)
MCH: 30.2 pg (ref 26.0–34.0)
MCHC: 32.9 g/dL (ref 30.0–36.0)
MCV: 92 fL (ref 78.0–100.0)
Platelets: 184 10*3/uL (ref 150–400)
RBC: 3.87 MIL/uL (ref 3.87–5.11)
RDW: 13.8 % (ref 11.5–15.5)
WBC: 11.9 10*3/uL — AB (ref 4.0–10.5)

## 2016-07-16 LAB — SEDIMENTATION RATE: Sed Rate: 50 mm/hr — ABNORMAL HIGH (ref 0–22)

## 2016-07-16 MED ORDER — GABAPENTIN 300 MG PO CAPS
300.0000 mg | ORAL_CAPSULE | Freq: Three times a day (TID) | ORAL | Status: DC
  Administered 2016-07-16 – 2016-07-18 (×5): 300 mg via ORAL
  Filled 2016-07-16 (×5): qty 1

## 2016-07-16 MED ORDER — METRONIDAZOLE 500 MG PO TABS
500.0000 mg | ORAL_TABLET | Freq: Three times a day (TID) | ORAL | Status: DC
  Administered 2016-07-16 – 2016-07-18 (×6): 500 mg via ORAL
  Filled 2016-07-16 (×6): qty 1

## 2016-07-16 MED ORDER — TRAZODONE HCL 50 MG PO TABS
75.0000 mg | ORAL_TABLET | Freq: Every day | ORAL | Status: DC
  Administered 2016-07-16 – 2016-07-17 (×2): 75 mg via ORAL
  Filled 2016-07-16 (×2): qty 2

## 2016-07-16 MED ORDER — ASPIRIN EC 81 MG PO TBEC
162.0000 mg | DELAYED_RELEASE_TABLET | Freq: Every day | ORAL | Status: DC
  Administered 2016-07-17 – 2016-07-18 (×2): 162 mg via ORAL
  Filled 2016-07-16 (×2): qty 2

## 2016-07-16 MED ORDER — GUAIFENESIN ER 600 MG PO TB12
600.0000 mg | ORAL_TABLET | Freq: Two times a day (BID) | ORAL | Status: DC
  Administered 2016-07-16 – 2016-07-18 (×5): 600 mg via ORAL
  Filled 2016-07-16 (×5): qty 1

## 2016-07-16 MED ORDER — HYDROCOD POLST-CPM POLST ER 10-8 MG/5ML PO SUER
2.5000 mL | Freq: Two times a day (BID) | ORAL | Status: DC | PRN
  Administered 2016-07-16 – 2016-07-17 (×2): 2.5 mL via ORAL
  Filled 2016-07-16 (×2): qty 5

## 2016-07-16 MED ORDER — SODIUM CHLORIDE 0.9 % IV SOLN
1.5000 g | Freq: Four times a day (QID) | INTRAVENOUS | Status: DC
  Administered 2016-07-16 – 2016-07-18 (×8): 1.5 g via INTRAVENOUS
  Filled 2016-07-16 (×13): qty 1.5

## 2016-07-16 NOTE — Evaluation (Signed)
Speech Language Pathology Evaluation Patient Details Name: [name] MRN: [id number] DOB: Oct 21, 1933 Today's Date: 07/16/2016 Time: 1620-1700 SLP Time Calculation (min) (ACUTE ONLY): 40 min  Problem List:  Patient Active Problem List   Diagnosis Date Noted  . Acute CVA (cerebrovascular accident) ([place]) 07/14/2016  . Meningioma ([place]) 07/14/2016  . Liver masses 07/14/2016  . Volume depletion 07/13/2016  . Diarrhea   . SOB (shortness of breath) 10/23/2015  . Asthmatic bronchitis 10/23/2015  . GAD (generalized anxiety disorder) 09/30/2015  . Peripheral edema 09/30/2015  . Insomnia 09/30/2015  . Localization-related partial epilepsy with simple partial seizures ([place]) 06/27/2015  . Allergic rhinitis 05/24/2015  . Atrial fibrillation ([place]) 01/06/2015  . Syncope 01/06/2015  . Sinus bradycardia 01/03/2015  . Paroxysmal atrial fibrillation ([place]) 01/03/2015  . Fatigue 01/03/2015  . Morbid obesity ([place]) 01/03/2015  . Solitary pulmonary nodule 06/02/2014  . Thyroid nodule 05/08/2014  . Palpitation 05/07/2014  . Chest pain 05/07/2014  . Depression   . Seizure disorder ([place])   . TIA (transient ischemic attack)   . Hypothyroidism   . Cerebellar degeneration   . IRRITABLE BOWEL SYNDROME 02/18/2008  . Hyperlipemia 09/10/2006  . Essential hypertension 09/10/2006  . GERD 09/10/2006  . CVA (cerebral infarction) 05/31/2003   Past Medical History:  Past Medical History:  Diagnosis Date  . Acute respiratory failure with hypoxia ([place]) 10/23/2015  . Allergic rhinitis    PT. DENIES  . Anxiety   . Arthritis    NECK  . Ataxia   . Bradycardia    primarily nocturnal  . Burning tongue syndrome 25 years  . Cataract   . Cerebellar degeneration   . Chronic pericarditis   . Chronic urinary tract infection   . Complication of anesthesia    low o2 sats, coded 30 years ago  . CVA (cerebral infarction) 05/2003  . Depression   . Gait disorder   . Gastric polyps   . GERD (gastroesophageal  reflux disease)   . High cholesterol   . Hyperlipidemia   . Hypertension   . Hypertension   . Hypotension   . Hypothyroidism   . IBS (irritable bowel syndrome)   . Obesity   . Paroxysmal atrial fibrillation ([name])    chads2vasc score is 6,  she is felt to be a poor candidate for anticoagulation  . Personal history of arterial venous malformation (AVM)    right side of face  . Seizure disorder ([place])   . Seizures ([place]) 2003   " smelling"- Gabapentin "no problem"  . Shortness of breath dyspnea    with exertion  . Sternum fx 10/27/2013  . Stroke [facility]) 5 years ago   Right side of face weak, slurred speach-   . Thyroid disease   . TIA (transient ischemic attack)   . UTI (lower urinary tract infection) 03/27/2016   "frequently"   Past Surgical History:  Past Surgical History:  Procedure Laterality Date  . APPENDECTOMY  82 years old  . COLONOSCOPY  2006, 2009  . COLONOSCOPY WITH PROPOFOL N/A 03/28/2016   Procedure: COLONOSCOPY WITH PROPOFOL;  Surgeon: Gatha Mayer, MD;  Location: [facility];  Service: Endoscopy;  Laterality: N/A;  . cyst removed  35 years ago  . EP IMPLANTABLE DEVICE N/A 01/06/2015   Procedure: Loop Recorder Insertion;  Surgeon: Thompson Grayer, MD;  Location: [facility];  Service: Cardiovascular;  Laterality: N/A;  . EYE SURGERY Right    Cataract  . KNEE ARTHROSCOPY Right 11/14/2006  . KNEE ARTHROSCOPY Bilateral  5 and 6 years ago  . KNEE ARTHROSCOPY WITH LATERAL MENISECTOMY  07/03/2012   Procedure: KNEE ARTHROSCOPY WITH LATERAL MENISECTOMY;  Surgeon: Magnus Sinning, MD;  Location: [facility];  Service: Orthopedics;  Laterality: Left;  with Partial Lateral Menisectomy and Medial Menisectomy. Shaving of medial and lateral femoral condyles. Shaving of patella. Removal of a loose body  . tibial and fibular internal fixation Left   . TOTAL ABDOMINAL HYSTERECTOMY  82 years old  . UPPER GASTROINTESTINAL ENDOSCOPY  2009, 2013   HPI:  [name] is an 82 y.o.  female with hx of cerebella degenration, HLD, HTN, hypothyrodism, CVA, TIA, Ataxia, PAF not on anticoagulation due to ataxia and fall risk, presented to the ER falling and fainted after having diarrhea trying to get up.  Evaluation in the ER included a head CT which showed no acute infarction, but a 10 mm rounded hyperdense structure abutting the paramedian right occipital cortex along the tentorium. This may represent a small amount of subdural hemorrhage, or malformation of the straight sinus. She also has moderate chronic microvascular disease. Areas of hypoattenuation in bilateral cerebellar cortex, likely sequela of prior ischemia.  She also complained of lower leg pain and lower abdominal pain.  Abdominal pelvic CT was done, showing hepatic lesions which were new and suspicous for metatastic lesion.  Radiologist recommended chest CT to look for metastatic source.  She also complained of pain in her legs, originally both legs, but it seems that her pain is mostly on the left hip.  Her serology showed WBC of 13.2 K, and normal Cr.  No LFT was done.  Hospitalist was asked to admit her for syncope, felt to be due to volume depletion because of diarrhea, and leg and hip pain, unable to ambulate.   MRI of brain indicates 4 mm acute infarction in the genu of the corpus callosum on the Right. Speech language eval was recommended to assess pt. Current status.   Assessment / Plan / Recommendation Clinical Impression   Pt was seen for speech language evaluation with family present. Pt was cooperative during visit. Her speech was mildly dysarthric, however this is likely related to cerebella degeneration (pt states this is her baseline).  Memory was [facility], as she recalled daily events and was Ox4. Sequencing of daily routines was [facility]. She completed 1-2 step directions with no difficulty. She answered problem solving scenarios related to home environment with no difficulty. She did have difficulty with sustained  attention, as she frequently shifted topics today, min redirection needed. She states that she completes her own medication management and cooking at home with no difficulty. Noted minimal words finding difficulty at times, when naming her numerous doctors. Oral motor functioning is WFL. She notes difficulty with swallowing (pointed to sternum area-likely esophageal) stating "food becomes stuck" - she has hx of esophageal dilation - recommended f/u with GI as outpatient if this continues. Recommended home health ST to target above cognitive-linguistic deficits - pt and family are in agreement.     SLP Assessment  All further Speech Lanaguage Pathology  needs can be addressed in the next venue of care    Follow Up Recommendations  Home health SLP    Frequency and Duration Other (Comment)   (n/a)      SLP Evaluation Cognition  Overall Cognitive Status: Impaired/Different from baseline Arousal/Alertness: Awake/alert Orientation Level: Oriented X4 Attention: Sustained Sustained Attention: Impaired Sustained Attention Impairment: Verbal basic Memory: Appears intact Awareness: Appears intact Problem Solving: Appears intact Executive  Function: Self Correcting (appears intact ) Self Monitoring: Appears intact Self Monitoring Impairment: Verbal basic Self Correcting: Appears intact Safety/Judgment: Appears intact       Comprehension  Auditory Comprehension Overall Auditory Comprehension: Appears within functional limits for tasks assessed Yes/No Questions: Within Functional Limits Commands: Within Functional Limits Conversation: Simple Interfering Components: Attention Visual Recognition/Discrimination Discrimination: Within Function Limits Reading Comprehension Reading Status: Unable to assess (comment) (visual impairment )    Expression Expression Primary Mode of Expression: Verbal Verbal Expression Overall Verbal Expression: Appears within functional limits for tasks  assessed Initiation: No impairment Automatic Speech: Name Level of Generative/Spontaneous Verbalization: Word Repetition: No impairment Naming: Impairment Responsive: 76-100% accurate Confrontation: Within functional limits Convergent: 75-100% accurate Divergent: 50-74% accurate Verbal Errors: Other (comment) [facility]) Pragmatics: Impairment;No impairment Written Expression Dominant Hand: Right   Oral / Motor  Oral Motor/Sensory Function Overall Oral Motor/Sensory Function: Within functional limits Motor Speech Overall Motor Speech: Other (comment) (mild dysarthria ) Phonation: Normal Resonance: Within functional limits Articulation: Within functional limitis Intelligibility: Intelligible Motor Planning: Witnin functional limits   GO            Thank you,   Renato Gails. Megan Salon Casper, CCC-SLP  Speech-Language Pathologist [phone]            Luther Redo 07/16/2016, 5:26 PM

## 2016-07-16 NOTE — Progress Notes (Signed)
PROGRESS NOTE    [name]  PXT:[id number] DOB: Oct 07, 1933 DOA: 07/13/2016 PCP: Evelina Dun, FNP     Brief Narrative:  82 y/o woman admitted from home on 12/15 after a syncopal event.   Assessment & Plan:   Principal Problem:   Acute CVA (cerebrovascular accident) [facility]) Active Problems:   Cerebellar degeneration   Hypothyroidism   Paroxysmal atrial fibrillation ([name])   Atrial fibrillation ([name])   Syncope   Diarrhea   Volume depletion   Meningioma ([name])   Liver masses   Acute CVA -MRI with acute infarct of the right corpus callosum. -Start ASA (patient refuses to take because of ?hemangioma of face), LDL 61, ECHO pending, carotid dopplers less than 50 % stenosis.  -PT recs SNF, but patient and husband would prefer home. He is willing to provide 24 hour supervision. -Neurology consult pending. Can patient undergo liver Biopsy during this admission with recent stroke ?  -This likely explains her acute syncope.  Meningioma -Incidental finding. -no need for further work up.  Liver masses -Of unclear significance. -CT chest without major abnormalities. -will need biopsy.  -Check blood culture.   Colitis; patient report abdominal pain, diarrhea, leurkocytosis. CT scan showed transverse and descending colitis.  Follow GI pathogen, check for C diff.  Start Unasyn and flagyl.  Blood culture.   Hypothyroidism -Continue synthroid.  A Fib -Rate controlled. -Not good anticoagulation candidate given frequent falls and ataxia.  Cough; start guaifenesin.    DVT prophylaxis: SQ heparin Code Status: full code Family Communication: husband at bedside updated on plan of care and questions answered Disposition Plan: pending PT evaluation  Consultants:   Neurology (requested)  Procedures:   None  Antimicrobials:  Anti-infectives    Start     Dose/Rate Route Frequency Ordered Stop   07/16/16 1400  metroNIDAZOLE (FLAGYL) tablet 500 mg     500 mg Oral Every 8  hours 07/16/16 1030     07/16/16 1200  ampicillin-sulbactam (UNASYN) 1.5 g in sodium chloride 0.9 % 50 mL IVPB     1.5 g 100 mL/hr over 30 Minutes Intravenous Every 6 hours 07/16/16 1035         Subjective: Dysarthric speech, facial droop Complaining of abdominal pain, still having diarrhea.  Report abdominal pain after coughing.   Objective: Vitals:   07/15/16 2100 07/16/16 0100 07/16/16 0500 07/16/16 0929  BP: (!) 113/51 (!) 115/50 (!) 135/52 (!) 118/48  Pulse: 73 89 93 86  Resp: '20 20 20 20  '$ Temp: 98.3 F (36.8 C) 98.9 F (37.2 C) 98.3 F (36.8 C) 97.9 F (36.6 C)  TempSrc: Oral Oral Oral Oral  SpO2: 100% 96% 96% 97%  Weight:      Height:        Intake/Output Summary (Last 24 hours) at 07/16/16 1149 Last data filed at 07/16/16 0647  Gross per 24 hour  Intake          2061.25 ml  Output              500 ml  Net          1561.25 ml   Filed Weights   07/13/16 1153 07/13/16 2200  Weight: 99.8 kg (220 lb) 99.8 kg (220 lb)    Examination:  General exam: Alert, awake, oriented x 3 Respiratory system: Clear to auscultation. Respiratory effort normal. Cardiovascular system:RRR. No murmurs, rubs, gallops. Gastrointestinal system: Abdomen is nondistended, soft and nontender. No organomegaly or masses felt. Normal bowel sounds heard. Central nervous system:  Alert and oriented but speech is difficult to understand. Extremities: No C/C/E, +pedal pulses Skin: No rashes, lesions or ulcers     Data Reviewed: I have personally reviewed following labs and imaging studies  CBC:  Recent Labs Lab 07/13/16 1214 07/14/16 0600 07/16/16 0752  WBC 13.2* 15.9* 11.9*  HGB 13.4 12.2 11.7*  HCT 42.0 37.3 35.6*  MCV 93.8 92.6 92.0  PLT 202 210 767   Basic Metabolic Panel:  Recent Labs Lab 07/13/16 1214 07/14/16 0600 07/16/16 0752  NA 135 135 135  K 4.1 3.7 3.6  CL 102 102 105  CO2 '25 25 24  '$ GLUCOSE 140* 137* 148*  BUN '13 16 13  '$ CREATININE 1.04* 0.98 0.84    CALCIUM 9.8 9.0 8.8*   GFR: Estimated Creatinine Clearance: 61.3 mL/min (by C-G formula based on SCr of 0.84 mg/dL). Liver Function Tests:  Recent Labs Lab 07/14/16 0600  AST 47*  ALT 18  ALKPHOS 67  BILITOT 0.5  PROT 6.0*  ALBUMIN 3.2*   No results for input(s): LIPASE, AMYLASE in the last 168 hours. No results for input(s): AMMONIA in the last 168 hours. Coagulation Profile: No results for input(s): INR, PROTIME in the last 168 hours. Cardiac Enzymes:  Recent Labs Lab 07/13/16 1231  CKTOTAL 34*   BNP (last 3 results) No results for input(s): PROBNP in the last 8760 hours. HbA1C:  Recent Labs  07/15/16 0444  HGBA1C 5.7*   CBG:  Recent Labs Lab 07/13/16 1248 07/13/16 1321  GLUCAP 138* 94   Lipid Profile:  Recent Labs  07/15/16 0444  CHOL 124  HDL 50  LDLCALC 61  TRIG 65  CHOLHDL 2.5   Thyroid Function Tests:  Recent Labs  07/13/16 1322  TSH 2.224   Anemia Panel: No results for input(s): VITAMINB12, FOLATE, FERRITIN, TIBC, IRON, RETICCTPCT in the last 72 hours. Urine analysis:    Component Value Date/Time   COLORURINE YELLOW 07/13/2016 1919   APPEARANCEUR HAZY (A) 07/13/2016 1919   APPEARANCEUR Clear 02/17/2016 1707   LABSPEC 1.040 (H) 07/13/2016 1919   PHURINE 5.0 07/13/2016 1919   GLUCOSEU NEGATIVE 07/13/2016 1919   HGBUR NEGATIVE 07/13/2016 1919   BILIRUBINUR NEGATIVE 07/13/2016 1919   BILIRUBINUR Negative 02/17/2016 1707   [place] 07/13/2016 1919   PROTEINUR NEGATIVE 07/13/2016 1919   UROBILINOGEN negative 05/24/2015 1434   NITRITE NEGATIVE 07/13/2016 1919   LEUKOCYTESUR NEGATIVE 07/13/2016 1919   LEUKOCYTESUR Negative 02/17/2016 1707   Sepsis Labs: '@LABRCNTIP'$ (procalcitonin:4,lacticidven:4)  )No results found for this or any previous visit (from the past 240 hour(s)).       Radiology Studies: US Carotid Bilateral (at [facility] Only)  Result Date: 07/16/2016 CLINICAL DATA:  Stroke. EXAM: BILATERAL CAROTID  DUPLEX ULTRASOUND TECHNIQUE: Pearline Cables scale imaging, color Doppler and duplex ultrasound were performed of bilateral carotid and vertebral arteries in the neck. COMPARISON:  MRI 07/14/2016. FINDINGS: Criteria: Quantification of carotid stenosis is based on velocity parameters that correlate the residual internal carotid diameter with NASCET-based stenosis levels, using the diameter of the distal internal carotid lumen as the denominator for stenosis measurement. Exam was limited due to patient positioning and movement. The following velocity measurements were obtained: RIGHT ICA:  125/15 cm/sec CCA:  209/47 cm/sec SYSTOLIC ICA/CCA RATIO:  0.8 DIASTOLIC ICA/CCA RATIO:  1.0 ECA:  161 cm/sec LEFT ICA:  116/19 cm/sec CCA:  096/2 cm/sec SYSTOLIC ICA/CCA RATIO:  1.1 DIASTOLIC ICA/CCA RATIO:  2.3 ECA:  150 cm/sec RIGHT CAROTID ARTERY: Diffuse intimal thickening. Mild right carotid  bifurcation atherosclerotic vascular plaque. No flow limiting stenosis. RIGHT VERTEBRAL ARTERY:  Patent with antegrade flow. LEFT CAROTID ARTERY: Diffuse intimal thickening. Mild left carotid bifurcation atherosclerotic vascular plaque. No flow limiting stenosis. LEFT VERTEBRAL ARTERY:  Patent with antegrade flow. IMPRESSION: 1. Diffuse bilateral wall thickening. Mild bilateral carotid bifurcation atherosclerotic vascular plaque. No flow limiting stenosis. Degree of stenosis less than 50% bilaterally. 2. Vertebral arteries are patent with antegrade flow. Electronically Signed   By: Marcello Moores  Register   On: 07/16/2016 06:53        Scheduled Meds: . amitriptyline  25 mg Oral QHS  . amLODipine  5 mg Oral Daily  . ampicillin-sulbactam (UNASYN) IV  1.5 g Intravenous Q6H  . aspirin EC  81 mg Oral Daily  . escitalopram  20 mg Oral QHS  . gabapentin  300 mg Oral Q1200  . gabapentin  600 mg Oral BID AC & HS  . guaiFENesin  600 mg Oral BID  . heparin  5,000 Units Subcutaneous Q8H  . levothyroxine  75 mcg Oral QAC breakfast  . metroNIDAZOLE  500  mg Oral Q8H  . pantoprazole  40 mg Oral Daily  . pravastatin  40 mg Oral q1800  . traZODone  150 mg Oral QHS   Continuous Infusions: . dextrose 5 % and 0.9% NaCl Stopped (07/16/16 1020)     LOS: 2 days    Time spent: 25 minutes. Greater than 50% of this time was spent in direct contact with the patient coordinating care.     Elmarie Shiley, MD [facility] Pager [phone]  If 7PM-7AM, please contact night-coverage [email or web address] Password [email or web address] 07/16/2016, 11:49 AM

## 2016-07-16 NOTE — Progress Notes (Signed)
Pharmacy Antibiotic Note  Seara Hinesley is a 82 y.o. female admitted on 07/13/2016 with intra-abdominal infection.  Pharmacy has been consulted for UNASYN dosing.  Plan: Unasyn 1.5gm IV q6hrs Monitor labs, progress, c/s  Height: '5\' 4"'$  (162.6 cm) Weight: 220 lb (99.8 kg) IBW/kg (Calculated) : 54.7  Temp (24hrs), Avg:98.6 F (37 C), Min:97.9 F (36.6 C), Max:99.1 F (37.3 C)   Recent Labs Lab 07/13/16 1214 07/14/16 0600 07/16/16 0752  WBC 13.2* 15.9* 11.9*  CREATININE 1.04* 0.98 0.84    Estimated Creatinine Clearance: 61.3 mL/min (by C-G formula based on SCr of 0.84 mg/dL).    Allergies  Allergen Reactions  . Lisinopril Cough    Antimicrobials this admission: Unasyn 12/18 >>   Dose adjustments this admission:  Microbiology results:  BCx: pending  UCx:    Sputum:    MRSA PCR:   Thank you for allowing pharmacy to be a part of this patient's care.  Hart Robinsons A 07/16/2016 10:37 AM

## 2016-07-16 NOTE — Evaluation (Signed)
Occupational Therapy Evaluation Patient Details Name: [name] MRN: [id number] DOB: 1933-08-15 Today's Date: 07/16/2016    History of Present Illness 82 y.o. female with hx of cerebella degenration, HLD, HTN, hypothyrodism, CVA, TIA, Ataxia, PAF not on anticoagulation due to ataxia and fall risk, presented to the ER falling and fainted after having diarrhea trying to get up.  Evaluation in the ER included a head CT which showed no acute infarction, but a 10 mm rounded hyperdense structure abutting the paramedian right occipital cortex along the tentorium. This may represent a small amount of subdural hemorrhage, or malformation of the straight sinus. She also has moderate chronic microvascular disease. Areas of hypoattenuation in bilateral cerebellar cortex, likely sequela of prior ischemia.  She also complained of lower leg pain and lower abdominal pain.  Abdominal pelvic CT was done, showing hepatic lesions which were new and suspicous for metatastic lesion.  Radiologist recommended chest CT to look for metastatic source.  She also complained of pain in her legs, originally both legs, but it seems that her pain is mostly on the left hip.  Her serology showed WBC of 13.2 K, and normal Cr.  No LFT was done.  Hospitalist was asked to admit her for syncope, felt to be due to volume depletion because of diarrhea, and leg and hip pain, unable to ambulate.  MRI with acute infarct of the right corpus callosum.  Dx: CVA   Clinical Impression   Pt in bed upon therapy arrival. Nursing reports that patient was having difficulty with feeding herself breakfast and had just called to have someone feed her. Nurse tech assisted therapist with boosting patient in bed and repositioning in order for patient to feed self. Patient with increased difficulty completing all daily tasks at this time. Extensive education given regarding reasons to go to SNF at discharge versus how unsafe it will be at home. Pt continues  to refuse SNF stating she just wants to go home. Recommendation is still a SNF from therapy although if patient returns home she will require 24 hour assistance with all daily tasks as well as Home health therapy. Pt will benefit from skilled OT service until discharge to increase functional performance and BUE strength in order to complete daily tasks at discharge.    Follow Up Recommendations  SNF;Home health OT (Pt is refusing SNF even with education from [facility] bed;Other (comment) [facility] lift)       Precautions / Restrictions Precautions Precautions: Fall Precaution Comments: 6-8 falls in the last 6 months.  2 yesterday when transferring from the bed<>w/c.  Restrictions Weight Bearing Restrictions: No              ADL Overall ADL's : Needs assistance/impaired Eating/Feeding: Set up;Bed level Eating/Feeding Details (indicate cue type and reason): Required assistance with proper positioning and set up to be able to self feed.                                 Functional mobility during ADLs: Total assistance                 Pertinent Vitals/Pain Pain Assessment: 0-10 Pain Score: 5  Pain Location: Left hip Pain Descriptors / Indicators: Aching Pain Intervention(s): Repositioned     Hand Dominance Right   Extremity/Trunk Assessment Upper Extremity Assessment Upper Extremity Assessment: Generalized weakness   Lower Extremity Assessment Lower Extremity Assessment:  Defer to PT evaluation       Communication Communication Communication: No difficulties   Cognition Arousal/Alertness: Awake/alert Behavior During Therapy: WFL for tasks assessed/performed Overall Cognitive Status: Impaired/Different from baseline Area of Impairment: Safety/judgement                      Exercises   Other Exercises Other Exercises: A/ROM shoulder protraction; 10X Other Exercises: X to V arms; A/ROM; 10X   Shoulder  Instructions      Home Living Family/patient expects to be discharged to:: Private residence (Therapy is recommending SNF) Living Arrangements: Spouse/significant other Available Help at Discharge: Available 24 hours/day Type of Home: House Home Access: Ramped entrance     Home Layout: Multi-level;Able to live on main level with bedroom/bathroom     Bathroom Shower/Tub: Tub/shower unit   Bathroom Toilet: Handicapped height     Home Equipment: Grab bars - tub/shower;Walker - 2 wheels;Walker - 4 wheels;Bedside commode;Wheelchair - manual;Shower seat          Prior Functioning/Environment Level of Independence: Independent with assistive device(s)  Gait / Transfers Assistance Needed: Pt mobilizes in the w/c.  Pt places the w/c in front of her and turns 180* to transfer into her w/c.  Pt states she is independent with this transfer.  ADL's / Homemaking Assistance Needed: independent with dressing and bathing.  Husband runs errands and goes grocery shopping.              OT Problem List: Decreased strength;Decreased activity tolerance;Decreased knowledge of use of DME or AE;Impaired balance (sitting and/or standing);Decreased safety awareness   OT Treatment/Interventions: Self-care/ADL training;Therapeutic exercise;DME and/or AE instruction;Patient/family education;Therapeutic activities;Balance training    OT Goals(Current goals can be found in the care plan section) Acute Rehab OT Goals Patient Stated Goal: To go home.  OT Goal Formulation: With patient Time For Goal Achievement: 07/30/16 Potential to Achieve Goals: Fair ADL Goals Pt Will Perform Grooming: with set-up;sitting Pt Will Transfer to Toilet: with mod assist;bedside commode Pt/caregiver will Perform Home Exercise Program: Increased strength;Both right and left upper extremity;With theraband;With written HEP provided Additional ADL Goal #1: Pt will tolerate sitting on EOB for approximately 5-10 minutes in order to  tolerate completing  ADL tasks such as bathing and dressing with less difficulty.    OT Frequency: Min 2X/week   Barriers to D/C: Other (comment)  Pt requires Max-Total Assist with ADLs at this time.          End of Session    Activity Tolerance: Patient tolerated treatment well Patient left: in bed;with call bell/phone within reach;with bed alarm set   Time: 7915-0413 OT Time Calculation (min): 44 min Charges:  OT General Charges $OT Visit: 1 Procedure OT Evaluation $OT Eval Low Complexity: 1 Procedure OT Treatments $Therapeutic Exercise: 8-22 mins G-Codes:    08-11-16, 9:23 AM  Ailene Ravel, OTR/L,CBIS  [phone]

## 2016-07-16 NOTE — Care Management Important Message (Signed)
Important Message  Patient Details  Name: Maya Arcand MRN: [id number] Date of Birth: 04-07-1934   Medicare Important Message Given:  Yes    Koreena Joost, Chauncey Reading, RN 07/16/2016, 11:49 AM

## 2016-07-16 NOTE — Care Management Note (Signed)
Case Management Note  Patient Details  Name: [name] MRN: [id number] Date of Birth: 1933/08/20  Subjective/Objective:  Patient adm from home with acute CVA. She lives with her husband. Patient has been recommended for SNF by OT and PT. Spoke with patient and son at bedside, husband was called on the phone during assessment. They do not want SNF and want patient to come home. Hospital bed and lift recommended. Patient and husband do not want a hospital bed but do want the lift. Patient and family offered choice of home health agencies.                   Action/Plan: Romualdo Bolk of [facility] notiifed and will obtain orders from chart. Lift will be delivered to patients home. Patient will have 24 hours supervision by husband.    Expected Discharge Date:        07/16/2016          Expected Discharge Plan:  [place]  In-House Referral:  NA  Discharge planning Services  CM Consult  Post Acute Care Choice:  Home Health, Durable Medical Equipment Choice offered to:  Patient, Adult Children, Spouse  DME Arranged:    DME Agency:  [facility] (hoyer lift)  HH Arranged:  RN, PT, OT, Social Work [employer] Agency:  [facility]  Status of Service:  In process, will continue to follow  If discussed at Long Length of Stay Meetings, dates discussed:    Additional Comments:  [name], Chauncey Reading, RN 07/16/2016, 11:36 AM

## 2016-07-16 NOTE — Progress Notes (Signed)
CT called for pt to have Angio CT of head and neck, stated the pt needed a 20G PIV. Unable to access 20G upon several attempts. Dr. Merlene Laughter aware. Will make CT and day shift nurse aware.

## 2016-07-16 NOTE — Consult Note (Signed)
Rebekah A. Merlene Laughter, MD     [email or web address]          [name] is an 82 y.o. female.   ASSESSMENT/PLAN:  1. Multifactorial chronic and acute gait impairment. Etiologies include long-standing history of ataxia, medication effect/polypharmacy and possible effect of acute tiny infarct.  Recommendations: 1. The Elavil has been reduced from 50 mg to 25 mg. I will also reduce the trazodone homogenous 50 mg to 75 mg. The gabapentin will also be reduced which has been chronologically linked to the deterioration of her gait impairment and may have contributed to this. This should be reduced 300 mg 3 times a day. 2. Obtain CTA of the head and neck especially given the evidence of multiple infarct on MRI of the cerebellum bilaterally. 3. Physical and occupational therapies. 4. Aspirin antiplatelet therapy is recommended.    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 She tells me that she has to others female siblings who have been diagnosed with similar abnormalities. With her younger sister is 83 years younger than she is and has severe complications from this. She tells me that her sister is unable to write and has severe difficulties ambulating. Her older sister apparently died in Dec 16, 2022 of died from competition of this disorder. Interestingly, the patient herself has 6 offspring and not a single one has been diagnosed with cerebellar issues. The patient does have a history of falls and therefore has been in a wheelchair most times although she does use a walker at times. The patient went to see her baseline neurologist just recently and the patient's gabapentin was increased because of facial pain. It appears that since then she had a couple of episodes where she simply falls to the ground. He tells me that she does not lose consciousness. She did report having her vision go out however due to one of  these events. The patient does not report having necessarily focal weakness, numbness, chest pain or shortness of breath. She does not report having holocephalic headaches. She has a right facial headache probably from complications of a venous angioma. The review of systems otherwise negative.  GENERAL: Pleasant obese female in no acute distress.  HEENT: Supple. Atraumatic normocephalic.   ABDOMEN: soft  EXTREMITIES: No edema   BACK: Normal.  SKIN: Normal by inspection.    MENTAL STATUS: Alert and oriented. Speech, language and cognition are generally intact. Judgment and insight normal.   CRANIAL NERVES: Pupils are equal, round and reactive to light and accommodation; extra ocular movements are full, there is marked gaze evoked nystagmus in all directions but particular horizontally; visual fields are full; upper and lower facial muscles are normal in strength and symmetric, there is no flattening of the nasolabial folds; tongue is midline; uvula is midline.  MOTOR: Normal tone, bulk and strength in the leg; she has 4+/5 strength in the upper extremities particularly on triceps and deltoids; no pronator drift.  COORDINATION: There is mild intention tremor bilaterally. No rest tremor; no postural tremor; no bradykinesia.  REFLEXES: Deep tendon reflexes are symmetrical and normal. Babinski reflexes are flexor bilaterally.   SENSATION: Normal to light touch.     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  UPDATE: Over the past year, her balance has  gotten worse.  She is no longer able to ambulate with walker and now only uses the wheelchair.  She also reports pain from the right jaw radiating up to the right temple for the  past several months.  It lasts a couple of seconds and occurs 2 to 3 times a week.  Sed rate from August was 8.  HISTORY: For 8 years, she has experienced gradual progression of balance problems.  She is now extremely unsteady on her feet and cannot ambulate without assistance.  She notes some associated dizziness with movement.  She also reports that her handwriting has gotten worse.  She has history of some swallowing issues when she eats and has had endoscopy with dilatation in the past.  She does have stress incontinence.  She has both horizontal and vertical diplopia, which was previously briefly treated with prisms.  PT has not helped with gait or balance in the past.  To evaluate diplopia, she had an MRI of the brain and orbits with and without contrast on 06/07/15, which showed no acute intracranial abnormalities.  It did reveal small vessel ischemic changes, global atrophy, particularly of the cerebellum, and area of signal abnormality within the corpus callosum, all known previously known entities.  Recent myasthenia gravis panel and thyrotropin receptor antibody was negative.  TSH and Free T4 from April were normal.  She has a sister with cerebellar degeneration, diagnosed 15 years ago, and has gradually become disabled.  She has another sister with multiple sclerosis.  She takes gabapentin for "smelling seizures" which were diagnosed 14 years ago.  She reports history of an AVM on the right side of her face, diagnosed many years ago and therefore cannot use blood thinners.  However, prior MRA of the head and neck did not reveal ]]]]]]]]]]]]]]]]]]     Blood pressure (!) 117/45, pulse 90, temperature 98.3 F (36.8 C), temperature source Oral, resp. rate 20, height _0  (1.626 m), weight 220 lb (99.8 kg), SpO2 98 %.  Past Medical History:  Diagnosis Date  . Acute respiratory failure with hypoxia ([place]) 10/23/2015  . Allergic rhinitis    PT. DENIES  . Anxiety   . Arthritis     NECK  . Ataxia   . Bradycardia    primarily nocturnal  . Burning tongue syndrome 25 years  . Cataract   . Cerebellar degeneration   . Chronic pericarditis   . Chronic urinary tract infection   . Complication of anesthesia    low o2 sats, coded 30 years ago  . CVA (cerebral infarction) 05/2003  . Depression   . Gait disorder   . Gastric polyps   . GERD (gastroesophageal reflux disease)   . High cholesterol   . Hyperlipidemia   . Hypertension   . Hypertension   . Hypotension   . Hypothyroidism   . IBS (irritable bowel syndrome)   . Obesity   . Paroxysmal atrial fibrillation ([name])    chads2vasc score is 6,  she is felt to be a poor candidate for anticoagulation  . Personal history of arterial venous malformation (AVM)    right side of face  . Seizure disorder ([place])   . Seizures ([place]) 2003   " smelling"- Gabapentin "no problem"  . Shortness of breath dyspnea    with exertion  . Sternum fx 10/27/2013  . Stroke [facility]) 5 years ago   Right side of face weak, slurred speach-   . Thyroid disease   . TIA (transient ischemic attack)   .  UTI (lower urinary tract infection) 03/27/2016   "frequently"    Past Surgical History:  Procedure Laterality Date  . APPENDECTOMY  82 years old  . COLONOSCOPY  2006, 2009  . COLONOSCOPY WITH PROPOFOL N/A 03/28/2016   Procedure: COLONOSCOPY WITH PROPOFOL;  Surgeon: Gatha Mayer, MD;  Location: [facility];  Service: Endoscopy;  Laterality: N/A;  . cyst removed  35 years ago  . EP IMPLANTABLE DEVICE N/A 01/06/2015   Procedure: Loop Recorder Insertion;  Surgeon: Thompson Grayer, MD;  Location: [facility];  Service: Cardiovascular;  Laterality: N/A;  . EYE SURGERY Right    Cataract  . KNEE ARTHROSCOPY Right 11/14/2006  . KNEE ARTHROSCOPY Bilateral 5 and 6 years ago  . KNEE ARTHROSCOPY WITH LATERAL MENISECTOMY  07/03/2012   Procedure: KNEE ARTHROSCOPY WITH LATERAL MENISECTOMY;  Surgeon: Magnus Sinning, MD;  Location: [facility];  Service:  Orthopedics;  Laterality: Left;  with Partial Lateral Menisectomy and Medial Menisectomy. Shaving of medial and lateral femoral condyles. Shaving of patella. Removal of a loose body  . tibial and fibular internal fixation Left   . TOTAL ABDOMINAL HYSTERECTOMY  82 years old  . UPPER GASTROINTESTINAL ENDOSCOPY  2009, 2013    Family History  Problem Relation Age of Onset  . Heart attack Father 25    fatal  . Coronary artery disease Brother   . Diabetes Brother   . Prostate cancer Brother   . Prostate cancer Son     Social History:  reports that she quit smoking about 40 years ago. Her smoking use included Cigarettes. She has a 2.50 pack-year smoking history. She has never used smokeless tobacco. She reports that she does not drink alcohol or use drugs.  Allergies:  Allergies  Allergen Reactions  . Lisinopril Cough    Medications: Prior to Admission medications   Medication Sig Start Date End Date Taking? Authorizing Provider  ALPRAZolam (XANAX) 0.25 MG tablet Take 1 tablet (0.25 mg total) by mouth at bedtime as needed (or insomnia). 06/06/16  Yes Eustaquio Maize, MD  amLODipine (NORVASC) 5 MG tablet TAKE 1 TABLET DAILY 07/09/16  Yes Sharion Balloon, FNP  escitalopram (LEXAPRO) 20 MG tablet Take 1 tablet (20 mg total) by mouth at bedtime. 09/30/15  Yes Sharion Balloon, FNP  furosemide (LASIX) 40 MG tablet Take 40 mg by mouth daily as needed for fluid.   Yes Historical Provider, MD  gabapentin (NEURONTIN) 300 MG capsule 2 caps in AM, 1 cap at noon and 2 caps in PM daily Patient taking differently: Take 300-600 mg by mouth 3 (three) times daily. 2 caps in AM, 1 cap at noon and 2 caps in PM daily 06/26/16  Yes Adam Telford Nab, DO  levothyroxine (SYNTHROID, LEVOTHROID) 75 MCG tablet TAKE 1 TABLET DAILY BEFORE BREAKFAST 06/27/16  Yes Sharion Balloon, FNP  losartan (COZAAR) 50 MG tablet Take 1 tablet (50 mg total) by mouth daily. 09/30/15  Yes Sharion Balloon, FNP  lovastatin (MEVACOR) 40 MG tablet  Take 1 tablet (40 mg total) by mouth at bedtime. 09/30/15  Yes Sharion Balloon, FNP  nitrofurantoin (MACRODANTIN) 50 MG capsule Take 50 mg by mouth daily.  07/03/16  Yes Historical Provider, MD  nystatin (MYCOSTATIN) 100000 UNIT/ML suspension Take 5 mLs (500,000 Units total) by mouth 4 (four) times daily. Patient taking differently: Take 5 mLs by mouth daily as needed (for oral yeast/irritation).  04/12/16  Yes Sharion Balloon, FNP  omeprazole (PRILOSEC) 40 MG capsule Take  1 capsule (40 mg total) by mouth daily. 09/30/15  Yes Sharion Balloon, FNP  traZODone (DESYREL) 150 MG tablet Take 75-150 mg by mouth at bedtime.  04/22/16  Yes Historical Provider, MD    Scheduled Meds: . amitriptyline  25 mg Oral QHS  . amLODipine  5 mg Oral Daily  . ampicillin-sulbactam (UNASYN) IV  1.5 g Intravenous Q6H  . aspirin EC  81 mg Oral Daily  . escitalopram  20 mg Oral QHS  . gabapentin  300 mg Oral Q1200  . gabapentin  600 mg Oral BID AC & HS  . guaiFENesin  600 mg Oral BID  . heparin  5,000 Units Subcutaneous Q8H  . levothyroxine  75 mcg Oral QAC breakfast  . metroNIDAZOLE  500 mg Oral Q8H  . pantoprazole  40 mg Oral Daily  . pravastatin  40 mg Oral q1800  . traZODone  150 mg Oral QHS   Continuous Infusions: . dextrose 5 % and 0.9% NaCl 75 mL/hr at 07/16/16 1204   PRN Meds:.ALPRAZolam, benzonatate, HYDROmorphone (DILAUDID) injection, ondansetron **OR** ondansetron (ZOFRAN) IV     Results for orders placed or performed during the hospital encounter of 07/13/16 (from the past 48 hour(s))  Hemoglobin A1c     Status: Abnormal   Collection Time: 07/15/16  4:44 AM  Result Value Ref Range   Hgb A1c MFr Bld 5.7 (H) 4.8 - 5.6 %    Comment: (NOTE)         Pre-diabetes: 5.7 - 6.4         Diabetes: >6.4         Glycemic control for adults with diabetes: <7.0    Mean Plasma Glucose 117 mg/dL    Comment: (NOTE) Performed At: [facility] [place], [place] [id number] Lindon Romp MD  NO:[id number]   Lipid panel     Status: None   Collection Time: 07/15/16  4:44 AM  Result Value Ref Range   Cholesterol 124 0 - 200 mg/dL   Triglycerides 65 <150 mg/dL   HDL 50 >40 mg/dL   Total CHOL/HDL Ratio 2.5 RATIO   VLDL 13 0 - 40 mg/dL   LDL Cholesterol 61 0 - 99 mg/dL    Comment:        Total Cholesterol/HDL:CHD Risk Coronary Heart Disease Risk Table                     Men   Women  1/2 Average Risk   3.4   3.3  Average Risk       5.0   4.4  2 X Average Risk   9.6   7.1  3 X Average Risk  23.4   11.0        Use the calculated Patient Ratio above and the CHD Risk Table to determine the patient's CHD Risk.        ATP III CLASSIFICATION (LDL):  <100     mg/dL   Optimal  100-129  mg/dL   Near or Above                    Optimal  130-159  mg/dL   Borderline  160-189  mg/dL   High  >190     mg/dL   Very High   Gastrointestinal Panel by PCR , Stool     Status: None   Collection Time: 07/15/16 10:50 AM  Result Value Ref Range   Campylobacter species NOT DETECTED NOT  DETECTED   Plesimonas shigelloides NOT DETECTED NOT DETECTED   Salmonella species NOT DETECTED NOT DETECTED   Yersinia enterocolitica NOT DETECTED NOT DETECTED   Vibrio species NOT DETECTED NOT DETECTED   Vibrio cholerae NOT DETECTED NOT DETECTED   Enteroaggregative E coli (EAEC) NOT DETECTED NOT DETECTED   Enteropathogenic E coli (EPEC) NOT DETECTED NOT DETECTED   Enterotoxigenic E coli (ETEC) NOT DETECTED NOT DETECTED   Shiga like toxin producing E coli (STEC) NOT DETECTED NOT DETECTED   Shigella/Enteroinvasive E coli (EIEC) NOT DETECTED NOT DETECTED   Cryptosporidium NOT DETECTED NOT DETECTED   Cyclospora cayetanensis NOT DETECTED NOT DETECTED   Entamoeba histolytica NOT DETECTED NOT DETECTED   Giardia lamblia NOT DETECTED NOT DETECTED   Adenovirus F40/41 NOT DETECTED NOT DETECTED   Astrovirus NOT DETECTED NOT DETECTED   Norovirus GI/GII NOT DETECTED NOT DETECTED   Rotavirus A NOT DETECTED NOT  DETECTED   Sapovirus (I, II, IV, and V) NOT DETECTED NOT DETECTED  CBC     Status: Abnormal   Collection Time: 07/16/16  7:52 AM  Result Value Ref Range   WBC 11.9 (H) 4.0 - 10.5 K/uL   RBC 3.87 3.87 - 5.11 MIL/uL   Hemoglobin 11.7 (L) 12.0 - 15.0 g/dL   HCT 35.6 (L) 36.0 - 46.0 %   MCV 92.0 78.0 - 100.0 fL   MCH 30.2 26.0 - 34.0 pg   MCHC 32.9 30.0 - 36.0 g/dL   RDW 13.8 11.5 - 15.5 %   Platelets 184 150 - 400 K/uL  Basic metabolic panel     Status: Abnormal   Collection Time: 07/16/16  7:52 AM  Result Value Ref Range   Sodium 135 135 - 145 mmol/L   Potassium 3.6 3.5 - 5.1 mmol/L   Chloride 105 101 - 111 mmol/L   CO2 24 22 - 32 mmol/L   Glucose, Bld 148 (H) 65 - 99 mg/dL   BUN 13 6 - 20 mg/dL   Creatinine, Ser 0.84 0.44 - 1.00 mg/dL   Calcium 8.8 (L) 8.9 - 10.3 mg/dL   GFR calc non Af Amer >60 >60 mL/min   GFR calc Af Amer >60 >60 mL/min    Comment: (NOTE) The eGFR has been calculated using the CKD EPI equation. This calculation has not been validated in all clinical situations. eGFR's persistently <60 mL/min signify possible Chronic Kidney Disease.    Anion gap 6 5 - 15  Sedimentation rate     Status: Abnormal   Collection Time: 07/16/16  7:52 AM  Result Value Ref Range   Sed Rate 50 (H) 0 - 22 mm/hr  Culture, blood (routine x 2)     Status: None (Preliminary result)   Collection Time: 07/16/16 11:10 AM  Result Value Ref Range   Specimen Description BLOOD LEFT FOREARM    Special Requests BOTTLES DRAWN AEROBIC ONLY 4CC    Culture NO GROWTH <12 HOURS    Report Status PENDING   Culture, blood (routine x 2)     Status: None (Preliminary result)   Collection Time: 07/16/16 11:10 AM  Result Value Ref Range   Specimen Description BLOOD LEFT HAND    Special Requests BOTTLES DRAWN AEROBIC ONLY 4CC    Culture NO GROWTH <12 HOURS    Report Status PENDING     Studies/Results:  HEAD CT No evidence of acute infarction.  10 mm rounded hyperdense structure abutting the  paramedian right occipital cortex along the tentorium. This may represent a small amount  of subdural hemorrhage, or malformation of the straight sinus. Further evaluation with brain MRI may be considered if found clinically relevant.  Moderate chronic microvascular disease.  Areas of hypoattenuation in bilateral cerebellar cortex, likely sequela of prior ischemia.        BRAIN MRI FINDINGS: Brain: Diffusion imaging shows a 4 mm acute infarction at the genu of the internal capsule on the right. No other acute infarction. There are extensive old infarctions throughout the cerebellum. Chronic small-vessel ischemic changes affect the pons. Cerebral hemispheres show extensive chronic small-vessel ischemic changes throughout the deep and subcortical white matter. No evidence of intra-axial mass lesion, hemorrhage, hydrocephalus or extra-axial fluid collection. There is a 9 mm meningioma along the right side of the falx tentorial junction responsible for of the CT finding. No evidence of of venous invasion at this time. Mass-effect on the hand. Not present on the previous studies.  Vascular: Major vessels at the base of the brain show flow.  Skull and upper cervical spine: Negative  Sinuses/Orbits: Clear/normal  Other: None significant  IMPRESSION: 4 mm acute infarction in the genu of the corpus callosum on the right.  Extensive chronic small vessel ischemic changes throughout brain. Numerous old cerebellar infarctions.  9 mm meningioma on the right at the junction of the falx and tentorium, newly seen since last year, not significant at this time.     The brain MRI scan is reviewed in person and shows a tiny increased signal seen on DWI and dark on ADC scan involving the right internal capsule junction of the anterior and posterior limbs. There is marked confluent increased signal seen on FLAIR imaging consistent with marked microvascular changes. There appears  to be chronic encephalomalacia indicating chronic moderate size infarct involving the cerebellum bilaterally. No contrast enhancement is appreciated.     ECHO - Left ventricle: Wall thickness was increased in a pattern of mild   LVH. Systolic function was normal. The estimated ejection   fraction was in the range of 60% to 65%. Left ventricular   diastolic function parameters were normal. - Aortic valve: There was mild regurgitation. - Atrial septum: No defect or patent foramen ovale was identified.   CAROTID DOPPLERS  1. Diffuse bilateral wall thickening. Mild bilateral carotid bifurcation atherosclerotic vascular plaque. No flow limiting stenosis. Degree of stenosis less than 50% bilaterally.  2. Vertebral arteries are patent with antegrade flow.       Anders Hohmann A. Merlene Paul, M.D.  Diplomate, Tax adviser of Psychiatry and Neurology ( Neurology). 07/16/2016, 5:06 PM

## 2016-07-17 ENCOUNTER — Inpatient Hospital Stay (HOSPITAL_COMMUNITY): Payer: Medicare Other

## 2016-07-17 ENCOUNTER — Other Ambulatory Visit: Payer: Self-pay | Admitting: Family

## 2016-07-17 DIAGNOSIS — I639 Cerebral infarction, unspecified: Secondary | ICD-10-CM | POA: Diagnosis not present

## 2016-07-17 DIAGNOSIS — R270 Ataxia, unspecified: Secondary | ICD-10-CM | POA: Diagnosis not present

## 2016-07-17 DIAGNOSIS — I10 Essential (primary) hypertension: Secondary | ICD-10-CM

## 2016-07-17 LAB — C DIFFICILE QUICK SCREEN W PCR REFLEX
C DIFFICILE (CDIFF) INTERP: NOT DETECTED
C DIFFICILE (CDIFF) TOXIN: NEGATIVE
C Diff antigen: NEGATIVE

## 2016-07-17 LAB — VITAMIN B12: Vitamin B-12: 319 pg/mL (ref 180–914)

## 2016-07-17 MED ORDER — IOPAMIDOL (ISOVUE-370) INJECTION 76%
100.0000 mL | Freq: Once | INTRAVENOUS | Status: AC | PRN
  Administered 2016-07-17: 100 mL via INTRAVENOUS

## 2016-07-17 NOTE — Progress Notes (Signed)
PROGRESS NOTE    [name]  LDJ:[id number] DOB: June 22, 1934 DOA: 07/13/2016 PCP: Evelina Dun, FNP     Brief Narrative:  82 y/o woman admitted from home on 12/15 after a syncopal event.   Assessment & Plan:   Principal Problem:   Acute CVA (cerebrovascular accident) [facility]) Active Problems:   Cerebellar degeneration   Hypothyroidism   Paroxysmal atrial fibrillation ([name])   Atrial fibrillation ([name])   Syncope   Diarrhea   Volume depletion   Meningioma ([name])   Liver masses   Acute CVA -MRI with acute infarct of the right corpus callosum. -Start ASA (patient refuses to take because of ?hemangioma of face), LDL 61, ECHO pending, carotid dopplers less than 50 % stenosis.  -PT recs SNF, but patient and husband would prefere. He hom is willing to provide 24 hour supervision. -Neurology consult, appreciated. Plan for CT angio head neck  -This likely explains her acute syncope.  Meningioma -Incidental finding. -no need for further work up.  Liver masses -Of unclear significance. -CT chest without major abnormalities. -will need biopsy.  - blood culture no growth to date.   Colitis; patient report abdominal pain, diarrhea, leurkocytosis. CT scan showed transverse and descending colitis.  Follow GI pathogen negative , check for C diff.  Started Unasyn and flagyl day 2.  Blood culture.   Hypothyroidism -Continue synthroid.  A Fib -Rate controlled. -Not good anticoagulation candidate given frequent falls and ataxia.  Cough; started  guaifenesin.    DVT prophylaxis: SQ heparin Code Status: full code Family Communication: son at bedside/  Disposition Plan: home after stroke work up completed, improvement of colitis.   Consultants:   Neurology (requested)  Procedures:   None  Antimicrobials:  Anti-infectives    Start     Dose/Rate Route Frequency Ordered Stop   07/16/16 1400  metroNIDAZOLE (FLAGYL) tablet 500 mg     500 mg Oral Every 8 hours 07/16/16  1030     07/16/16 1200  ampicillin-sulbactam (UNASYN) 1.5 g in sodium chloride 0.9 % 50 mL IVPB     1.5 g 100 mL/hr over 30 Minutes Intravenous Every 6 hours 07/16/16 1035         Subjective: Report bowel Movement last night, watery , abdominal pain is better   Objective: Vitals:   07/16/16 1759 07/16/16 2100 07/17/16 0100 07/17/16 0500  BP: (!) 142/51 (!) 139/58 (!) 137/59 (!) 128/56  Pulse: 95 91 89 84  Resp: '20 20 20 20  '$ Temp: 98.7 F (37.1 C) 98.6 F (37 C) 98.5 F (36.9 C) 98.3 F (36.8 C)  TempSrc: Oral Oral Oral Oral  SpO2: 100% 100% 100% 98%  Weight:      Height:        Intake/Output Summary (Last 24 hours) at 07/17/16 1137 Last data filed at 07/17/16 0800  Gross per 24 hour  Intake          2416.25 ml  Output              951 ml  Net          1465.25 ml   Filed Weights   07/13/16 1153 07/13/16 2200  Weight: 99.8 kg (220 lb) 99.8 kg (220 lb)    Examination:  General exam: Alert, awake, oriented x 3 Respiratory system: Clear to auscultation. Respiratory effort normal. Cardiovascular system:RRR. No murmurs, rubs, gallops. Gastrointestinal system: Abdomen is nondistended, soft and nontender. No organomegaly or masses felt. Normal bowel sounds heard. Central nervous system: Alert and oriented  but speech is difficult to understand. Extremities: No C/C/E, +pedal pulses Skin: No rashes, lesions or ulcers     Data Reviewed: I have personally reviewed following labs and imaging studies  CBC:  Recent Labs Lab 07/13/16 1214 07/14/16 0600 07/16/16 0752  WBC 13.2* 15.9* 11.9*  HGB 13.4 12.2 11.7*  HCT 42.0 37.3 35.6*  MCV 93.8 92.6 92.0  PLT 202 210 737   Basic Metabolic Panel:  Recent Labs Lab 07/13/16 1214 07/14/16 0600 07/16/16 0752  NA 135 135 135  K 4.1 3.7 3.6  CL 102 102 105  CO2 '25 25 24  '$ GLUCOSE 140* 137* 148*  BUN '13 16 13  '$ CREATININE 1.04* 0.98 0.84  CALCIUM 9.8 9.0 8.8*   GFR: Estimated Creatinine Clearance: 61.3 mL/min (by  C-G formula based on SCr of 0.84 mg/dL). Liver Function Tests:  Recent Labs Lab 07/14/16 0600  AST 47*  ALT 18  ALKPHOS 67  BILITOT 0.5  PROT 6.0*  ALBUMIN 3.2*   No results for input(s): LIPASE, AMYLASE in the last 168 hours. No results for input(s): AMMONIA in the last 168 hours. Coagulation Profile: No results for input(s): INR, PROTIME in the last 168 hours. Cardiac Enzymes:  Recent Labs Lab 07/13/16 1231  CKTOTAL 34*   BNP (last 3 results) No results for input(s): PROBNP in the last 8760 hours. HbA1C:  Recent Labs  07/15/16 0444  HGBA1C 5.7*   CBG:  Recent Labs Lab 07/13/16 1248 07/13/16 1321  GLUCAP 138* 94   Lipid Profile:  Recent Labs  07/15/16 0444  CHOL 124  HDL 50  LDLCALC 61  TRIG 65  CHOLHDL 2.5   Thyroid Function Tests: No results for input(s): TSH, T4TOTAL, FREET4, T3FREE, THYROIDAB in the last 72 hours. Anemia Panel: No results for input(s): VITAMINB12, FOLATE, FERRITIN, TIBC, IRON, RETICCTPCT in the last 72 hours. Urine analysis:    Component Value Date/Time   COLORURINE YELLOW 07/13/2016 1919   APPEARANCEUR HAZY (A) 07/13/2016 1919   APPEARANCEUR Clear 02/17/2016 1707   LABSPEC 1.040 (H) 07/13/2016 1919   PHURINE 5.0 07/13/2016 1919   GLUCOSEU NEGATIVE 07/13/2016 1919   HGBUR NEGATIVE 07/13/2016 1919   BILIRUBINUR NEGATIVE 07/13/2016 1919   BILIRUBINUR Negative 02/17/2016 1707   KETONESUR NEGATIVE 07/13/2016 1919   PROTEINUR NEGATIVE 07/13/2016 1919   UROBILINOGEN negative 05/24/2015 1434   NITRITE NEGATIVE 07/13/2016 1919   LEUKOCYTESUR NEGATIVE 07/13/2016 1919   LEUKOCYTESUR Negative 02/17/2016 1707   Sepsis Labs: '@LABRCNTIP'$ (procalcitonin:4,lacticidven:4)  ) Recent Results (from the past 240 hour(s))  Gastrointestinal Panel by PCR , Stool     Status: None   Collection Time: 07/15/16 10:50 AM  Result Value Ref Range Status   Campylobacter species NOT DETECTED NOT DETECTED Final   Plesimonas shigelloides NOT  DETECTED NOT DETECTED Final   Salmonella species NOT DETECTED NOT DETECTED Final   Yersinia enterocolitica NOT DETECTED NOT DETECTED Final   Vibrio species NOT DETECTED NOT DETECTED Final   Vibrio cholerae NOT DETECTED NOT DETECTED Final   Enteroaggregative E coli (EAEC) NOT DETECTED NOT DETECTED Final   Enteropathogenic E coli (EPEC) NOT DETECTED NOT DETECTED Final   Enterotoxigenic E coli (ETEC) NOT DETECTED NOT DETECTED Final   Shiga like toxin producing E coli (STEC) NOT DETECTED NOT DETECTED Final   Shigella/Enteroinvasive E coli (EIEC) NOT DETECTED NOT DETECTED Final   Cryptosporidium NOT DETECTED NOT DETECTED Final   Cyclospora cayetanensis NOT DETECTED NOT DETECTED Final   Entamoeba histolytica NOT DETECTED NOT DETECTED Final   Giardia  lamblia NOT DETECTED NOT DETECTED Final   Adenovirus F40/41 NOT DETECTED NOT DETECTED Final   Astrovirus NOT DETECTED NOT DETECTED Final   Norovirus GI/GII NOT DETECTED NOT DETECTED Final   Rotavirus A NOT DETECTED NOT DETECTED Final   Sapovirus (I, II, IV, and V) NOT DETECTED NOT DETECTED Final  Culture, blood (routine x 2)     Status: None (Preliminary result)   Collection Time: 07/16/16 11:10 AM  Result Value Ref Range Status   Specimen Description BLOOD LEFT FOREARM  Final   Special Requests BOTTLES DRAWN AEROBIC ONLY 4CC  Final   Culture NO GROWTH < 24 HOURS  Final   Report Status PENDING  Incomplete  Culture, blood (routine x 2)     Status: None (Preliminary result)   Collection Time: 07/16/16 11:10 AM  Result Value Ref Range Status   Specimen Description BLOOD LEFT HAND  Final   Special Requests BOTTLES DRAWN AEROBIC ONLY 4CC  Final   Culture NO GROWTH < 24 HOURS  Final   Report Status PENDING  Incomplete         Radiology Studies: US Carotid Bilateral (at [facility] Only)  Result Date: 07/16/2016 CLINICAL DATA:  Stroke. EXAM: BILATERAL CAROTID DUPLEX ULTRASOUND TECHNIQUE: Pearline Cables scale imaging, color Doppler and duplex  ultrasound were performed of bilateral carotid and vertebral arteries in the neck. COMPARISON:  MRI 07/14/2016. FINDINGS: Criteria: Quantification of carotid stenosis is based on velocity parameters that correlate the residual internal carotid diameter with NASCET-based stenosis levels, using the diameter of the distal internal carotid lumen as the denominator for stenosis measurement. Exam was limited due to patient positioning and movement. The following velocity measurements were obtained: RIGHT ICA:  125/15 cm/sec CCA:  712/45 cm/sec SYSTOLIC ICA/CCA RATIO:  0.8 DIASTOLIC ICA/CCA RATIO:  1.0 ECA:  161 cm/sec LEFT ICA:  116/19 cm/sec CCA:  809/9 cm/sec SYSTOLIC ICA/CCA RATIO:  1.1 DIASTOLIC ICA/CCA RATIO:  2.3 ECA:  150 cm/sec RIGHT CAROTID ARTERY: Diffuse intimal thickening. Mild right carotid bifurcation atherosclerotic vascular plaque. No flow limiting stenosis. RIGHT VERTEBRAL ARTERY:  Patent with antegrade flow. LEFT CAROTID ARTERY: Diffuse intimal thickening. Mild left carotid bifurcation atherosclerotic vascular plaque. No flow limiting stenosis. LEFT VERTEBRAL ARTERY:  Patent with antegrade flow. IMPRESSION: 1. Diffuse bilateral wall thickening. Mild bilateral carotid bifurcation atherosclerotic vascular plaque. No flow limiting stenosis. Degree of stenosis less than 50% bilaterally. 2. Vertebral arteries are patent with antegrade flow. Electronically Signed   By: Marcello Moores  Register   On: 07/16/2016 06:53        Scheduled Meds: . amitriptyline  25 mg Oral QHS  . amLODipine  5 mg Oral Daily  . ampicillin-sulbactam (UNASYN) IV  1.5 g Intravenous Q6H  . aspirin EC  162 mg Oral Daily  . escitalopram  20 mg Oral QHS  . gabapentin  300 mg Oral TID  . guaiFENesin  600 mg Oral BID  . heparin  5,000 Units Subcutaneous Q8H  . levothyroxine  75 mcg Oral QAC breakfast  . metroNIDAZOLE  500 mg Oral Q8H  . pantoprazole  40 mg Oral Daily  . pravastatin  40 mg Oral q1800  . traZODone  75 mg Oral QHS    Continuous Infusions: . dextrose 5 % and 0.9% NaCl 75 mL/hr at 07/16/16 2222     LOS: 3 days    Time spent: 25 minutes. Greater than 50% of this time was spent in direct contact with the patient coordinating care.     Brenn Gatton, [employer]  A, MD [facility] Pager [phone]  If 7PM-7AM, please contact night-coverage [email or web address] Password [facility] 07/17/2016, 11:37 AM

## 2016-07-17 NOTE — Progress Notes (Signed)
Rebekah Isle A. Merlene Laughter, MD     [email or web address]          [name] is an 82 y.o. female.   ASSESSMENT/PLAN:  1. Multifactorial chronic and acute gait impairment. Etiologies include long-standing history of ataxia, medication effect/polypharmacy and possible effect of acute tiny infarct.  Recommendations: 1. The Elavil has been reduced from 50 mg to 25 mg. I will also reduce the trazodone 50 mg to 75 mg. The gabapentin will also be reduced which has been chronologically linked to the deterioration of her gait impairment and may have contributed to this. This should be reduced 300 mg 3 times a day. 3. Physical and occupational therapies. 4. Aspirin antiplatelet therapy is recommended. 5. I will sign off reconsult as needed.   Doing about the same.      GENERAL: Pleasant obese female in no acute distress.  HEENT: Supple. Atraumatic normocephalic.   ABDOMEN: soft  EXTREMITIES: No edema   BACK: Normal.  SKIN: Normal by inspection.    MENTAL STATUS: Alert and oriented. Speech, language and cognition are generally intact. Judgment and insight normal.   CRANIAL NERVES: Pupils are equal, round and reactive to light and accommodation; extra ocular movements are full, there is marked gaze evoked nystagmus in all directions but particular horizontally; visual fields are full; upper and lower facial muscles are normal in strength and symmetric, there is no flattening of the nasolabial folds; tongue is midline; uvula is midline.  MOTOR: Normal tone, bulk and strength in the leg; she has 4+/5 strength in the upper extremities particularly on triceps and deltoids; no pronator drift.  COORDINATION: There is mild intention tremor bilaterally. No rest tremor; no postural tremor; no bradykinesia.      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  UPDATE: Over the past year, her balance has gotten worse.  She is no longer able to ambulate with walker and now only uses the wheelchair.  She also reports pain from the right jaw radiating up to the right temple for the past several months.  It lasts a couple of seconds and occurs 2 to 3 times a week.  Sed rate from August was 8.  HISTORY: For 8 years, she has experienced gradual progression of balance problems.  She is now extremely unsteady on her feet and cannot ambulate without assistance.  She notes some associated dizziness with movement.  She also reports that her handwriting has gotten worse.  She has history of some swallowing issues when she eats and has had endoscopy with dilatation in the past.  She does have stress incontinence.  She has both horizontal and vertical diplopia, which was previously briefly treated with prisms.  PT has not helped with gait or balance in the past.  To evaluate diplopia, she had an MRI of the brain and orbits with and without contrast on 06/07/15, which showed no acute intracranial abnormalities.  It did reveal small vessel ischemic changes, global atrophy, particularly of the cerebellum, and area of signal abnormality within the corpus callosum, all known previously known entities.  Recent myasthenia gravis panel and thyrotropin receptor antibody was negative.  TSH and Free T4 from April were normal.  She has a sister with cerebellar degeneration, diagnosed 15 years ago, and has gradually become disabled.  She has another sister with  multiple sclerosis.  She takes gabapentin for "smelling seizures" which were diagnosed 14 years ago.  She reports history of an AVM on the right side of her face, diagnosed many years ago and therefore cannot use blood thinners.  However, prior MRA of the head and neck did  not reveal ]]]]]]]]]]]]]]]]]]     Blood pressure 132/71, pulse 72, temperature 98.5 F (36.9 C), temperature source Oral, resp. rate 20, height '5\' 4"'$  (1.626 m), weight 220 lb (99.8 kg), SpO2 98 %.  Past Medical History:  Diagnosis Date  . Acute respiratory failure with hypoxia ([place]) 10/23/2015  . Allergic rhinitis    PT. DENIES  . Anxiety   . Arthritis    NECK  . Ataxia   . Bradycardia    primarily nocturnal  . Burning tongue syndrome 25 years  . Cataract   . Cerebellar degeneration   . Chronic pericarditis   . Chronic urinary tract infection   . Complication of anesthesia    low o2 sats, coded 30 years ago  . CVA (cerebral infarction) 05/2003  . Depression   . Gait disorder   . Gastric polyps   . GERD (gastroesophageal reflux disease)   . High cholesterol   . Hyperlipidemia   . Hypertension   . Hypertension   . Hypotension   . Hypothyroidism   . IBS (irritable bowel syndrome)   . Obesity   . Paroxysmal atrial fibrillation ([name])    chads2vasc score is 6,  she is felt to be a poor candidate for anticoagulation  . Personal history of arterial venous malformation (AVM)    right side of face  . Seizure disorder ([place])   . Seizures ([place]) 2003   " smelling"- Gabapentin "no problem"  . Shortness of breath dyspnea    with exertion  . Sternum fx 10/27/2013  . Stroke [facility]) 5 years ago   Right side of face weak, slurred speach-   . Thyroid disease   . TIA (transient ischemic attack)   . UTI (lower urinary tract infection) 03/27/2016   "frequently"    Past Surgical History:  Procedure Laterality Date  . APPENDECTOMY  82 years old  . COLONOSCOPY  2006, 2009  . COLONOSCOPY WITH PROPOFOL N/A 03/28/2016   Procedure: COLONOSCOPY WITH PROPOFOL;  Surgeon: Gatha Mayer, MD;  Location: [facility];  Service: Endoscopy;  Laterality: N/A;  . cyst removed  35 years ago  . EP IMPLANTABLE DEVICE N/A 01/06/2015   Procedure: Loop Recorder Insertion;  Surgeon: Thompson Grayer, MD;   Location: [facility];  Service: Cardiovascular;  Laterality: N/A;  . EYE SURGERY Right    Cataract  . KNEE ARTHROSCOPY Right 11/14/2006  . KNEE ARTHROSCOPY Bilateral 5 and 6 years ago  . KNEE ARTHROSCOPY WITH LATERAL MENISECTOMY  07/03/2012   Procedure: KNEE ARTHROSCOPY WITH LATERAL MENISECTOMY;  Surgeon: Magnus Sinning, MD;  Location: [facility];  Service: Orthopedics;  Laterality: Left;  with Partial Lateral Menisectomy and Medial Menisectomy. Shaving of medial and lateral femoral condyles. Shaving of patella. Removal of a loose body  . tibial and fibular internal fixation Left   . TOTAL ABDOMINAL HYSTERECTOMY  82 years old  . UPPER GASTROINTESTINAL ENDOSCOPY  2009, 2013    Family History  Problem Relation Age of Onset  . Heart attack Father 2    fatal  . Coronary artery disease Brother   . Diabetes Brother   . Prostate cancer Brother   . Prostate cancer Son  Social History:  reports that she quit smoking about 40 years ago. Her smoking use included Cigarettes. She has a 2.50 pack-year smoking history. She has never used smokeless tobacco. She reports that she does not drink alcohol or use drugs.  Allergies:  Allergies  Allergen Reactions  . Lisinopril Cough    Medications: Prior to Admission medications   Medication Sig Start Date End Date Taking? Authorizing Provider  ALPRAZolam (XANAX) 0.25 MG tablet Take 1 tablet (0.25 mg total) by mouth at bedtime as needed (or insomnia). 06/06/16  Yes Eustaquio Maize, MD  amLODipine (NORVASC) 5 MG tablet TAKE 1 TABLET DAILY 07/09/16  Yes Sharion Balloon, FNP  escitalopram (LEXAPRO) 20 MG tablet Take 1 tablet (20 mg total) by mouth at bedtime. 09/30/15  Yes Sharion Balloon, FNP  furosemide (LASIX) 40 MG tablet Take 40 mg by mouth daily as needed for fluid.   Yes Historical Provider, MD  gabapentin (NEURONTIN) 300 MG capsule 2 caps in AM, 1 cap at noon and 2 caps in PM daily Patient taking differently: Take 300-600 mg by mouth 3  (three) times daily. 2 caps in AM, 1 cap at noon and 2 caps in PM daily 06/26/16  Yes Adam Telford Nab, DO  levothyroxine (SYNTHROID, LEVOTHROID) 75 MCG tablet TAKE 1 TABLET DAILY BEFORE BREAKFAST 06/27/16  Yes Sharion Balloon, FNP  losartan (COZAAR) 50 MG tablet Take 1 tablet (50 mg total) by mouth daily. 09/30/15  Yes Sharion Balloon, FNP  lovastatin (MEVACOR) 40 MG tablet Take 1 tablet (40 mg total) by mouth at bedtime. 09/30/15  Yes Sharion Balloon, FNP  nitrofurantoin (MACRODANTIN) 50 MG capsule Take 50 mg by mouth daily.  07/03/16  Yes Historical Provider, MD  nystatin (MYCOSTATIN) 100000 UNIT/ML suspension Take 5 mLs (500,000 Units total) by mouth 4 (four) times daily. Patient taking differently: Take 5 mLs by mouth daily as needed (for oral yeast/irritation).  04/12/16  Yes Sharion Balloon, FNP  omeprazole (PRILOSEC) 40 MG capsule Take 1 capsule (40 mg total) by mouth daily. 09/30/15  Yes Sharion Balloon, FNP  traZODone (DESYREL) 150 MG tablet Take 75-150 mg by mouth at bedtime.  04/22/16  Yes Historical Provider, MD    Scheduled Meds: . amitriptyline  25 mg Oral QHS  . amLODipine  5 mg Oral Daily  . ampicillin-sulbactam (UNASYN) IV  1.5 g Intravenous Q6H  . aspirin EC  162 mg Oral Daily  . escitalopram  20 mg Oral QHS  . gabapentin  300 mg Oral TID  . guaiFENesin  600 mg Oral BID  . heparin  5,000 Units Subcutaneous Q8H  . levothyroxine  75 mcg Oral QAC breakfast  . metroNIDAZOLE  500 mg Oral Q8H  . pantoprazole  40 mg Oral Daily  . pravastatin  40 mg Oral q1800  . traZODone  75 mg Oral QHS   Continuous Infusions: . dextrose 5 % and 0.9% NaCl 75 mL/hr at 07/16/16 2222   PRN Meds:.ALPRAZolam, benzonatate, HYDROmorphone (DILAUDID) injection, ondansetron **OR** ondansetron (ZOFRAN) IV     Results for orders placed or performed during the hospital encounter of 07/13/16 (from the past 48 hour(s))  CBC     Status: Abnormal   Collection Time: 07/16/16  7:52 AM  Result Value Ref Range   WBC  11.9 (H) 4.0 - 10.5 K/uL   RBC 3.87 3.87 - 5.11 MIL/uL   Hemoglobin 11.7 (L) 12.0 - 15.0 g/dL   HCT 35.6 (L) 36.0 - 46.0 %  MCV 92.0 78.0 - 100.0 fL   MCH 30.2 26.0 - 34.0 pg   MCHC 32.9 30.0 - 36.0 g/dL   RDW 59.7 47.1 - 85.5 %   Platelets 184 150 - 400 K/uL  Basic metabolic panel     Status: Abnormal   Collection Time: 07/16/16  7:52 AM  Result Value Ref Range   Sodium 135 135 - 145 mmol/L   Potassium 3.6 3.5 - 5.1 mmol/L   Chloride 105 101 - 111 mmol/L   CO2 24 22 - 32 mmol/L   Glucose, Bld 148 (H) 65 - 99 mg/dL   BUN 13 6 - 20 mg/dL   Creatinine, Ser 0.15 0.44 - 1.00 mg/dL   Calcium 8.8 (L) 8.9 - 10.3 mg/dL   GFR calc non Af Amer >60 >60 mL/min   GFR calc Af Amer >60 >60 mL/min    Comment: (NOTE) The eGFR has been calculated using the CKD EPI equation. This calculation has not been validated in all clinical situations. eGFR's persistently <60 mL/min signify possible Chronic Kidney Disease.    Anion gap 6 5 - 15  Sedimentation rate     Status: Abnormal   Collection Time: 07/16/16  7:52 AM  Result Value Ref Range   Sed Rate 50 (H) 0 - 22 mm/hr  Culture, blood (routine x 2)     Status: None (Preliminary result)   Collection Time: 07/16/16 11:10 AM  Result Value Ref Range   Specimen Description BLOOD LEFT FOREARM    Special Requests BOTTLES DRAWN AEROBIC ONLY 4CC    Culture NO GROWTH < 24 HOURS    Report Status PENDING   Culture, blood (routine x 2)     Status: None (Preliminary result)   Collection Time: 07/16/16 11:10 AM  Result Value Ref Range   Specimen Description BLOOD LEFT HAND    Special Requests BOTTLES DRAWN AEROBIC ONLY 4CC    Culture NO GROWTH < 24 HOURS    Report Status PENDING   Vitamin B12     Status: None   Collection Time: 07/17/16 11:26 AM  Result Value Ref Range   Vitamin B-12 319 180 - 914 pg/mL    Comment: (NOTE) This assay is not validated for testing neonatal or myeloproliferative syndrome specimens for Vitamin B12 levels. Performed at  [facility]     Studies/Results:  HEAD CT No evidence of acute infarction.  10 mm rounded hyperdense structure abutting the paramedian right occipital cortex along the tentorium. This may represent a small amount of subdural hemorrhage, or malformation of the straight sinus. Further evaluation with brain MRI may be considered if found clinically relevant.  Moderate chronic microvascular disease.  Areas of hypoattenuation in bilateral cerebellar cortex, likely sequela of prior ischemia.        BRAIN MRI FINDINGS: Brain: Diffusion imaging shows a 4 mm acute infarction at the genu of the internal capsule on the right. No other acute infarction. There are extensive old infarctions throughout the cerebellum. Chronic small-vessel ischemic changes affect the pons. Cerebral hemispheres show extensive chronic small-vessel ischemic changes throughout the deep and subcortical white matter. No evidence of intra-axial mass lesion, hemorrhage, hydrocephalus or extra-axial fluid collection. There is a 9 mm meningioma along the right side of the falx tentorial junction responsible for of the CT finding. No evidence of of venous invasion at this time. Mass-effect on the hand. Not present on the previous studies.  Vascular: Major vessels at the base of the brain show flow.  Skull  and upper cervical spine: Negative  Sinuses/Orbits: Clear/normal  Other: None significant  IMPRESSION: 4 mm acute infarction in the genu of the corpus callosum on the right.  Extensive chronic small vessel ischemic changes throughout brain. Numerous old cerebellar infarctions.  9 mm meningioma on the right at the junction of the falx and tentorium, newly seen since last year, not significant at this time.     The brain MRI scan is reviewed in person and shows a tiny increased signal seen on DWI and dark on ADC scan involving the right internal capsule junction of the anterior and  posterior limbs. There is marked confluent increased signal seen on FLAIR imaging consistent with marked microvascular changes. There appears to be chronic encephalomalacia indicating chronic moderate size infarct involving the cerebellum bilaterally. No contrast enhancement is appreciated.     ECHO - Left ventricle: Wall thickness was increased in a pattern of mild   LVH. Systolic function was normal. The estimated ejection   fraction was in the range of 60% to 65%. Left ventricular   diastolic function parameters were normal. - Aortic valve: There was mild regurgitation. - Atrial septum: No defect or patent foramen ovale was identified.   CAROTID DOPPLERS  1. Diffuse bilateral wall thickening. Mild bilateral carotid bifurcation atherosclerotic vascular plaque. No flow limiting stenosis. Degree of stenosis less than 50% bilaterally.  2. Vertebral arteries are patent with antegrade flow.        CTA BRAIN NECK FINDINGS: CT HEAD FINDINGS  Brain: Marked cerebellar atrophy bilaterally with chronic cerebellar infarcts. Mild cerebral atrophy. Negative for hydrocephalus. Chronic microvascular ischemic change in the white matter. Small acute infarct in the genu internal capsule on the right is best seen on MRI diffusion imaging. Negative for hemorrhage or mass.  Skull: Negative  Sinuses: Negative  Orbits: Right lens replacement.  No orbital mass.  Review of the MIP images confirms the above findings  CTA NECK FINDINGS  Aortic arch: Mild atherosclerotic calcification in the aortic arch without aneurysm. Proximal great vessels widely patent.  Right carotid system: . Right common carotid artery widely patent. Small atherosclerotic calcification at the right carotid bifurcation without stenosis.  Left carotid system: Left common carotid artery widely patent. This vessel is tortuous anteriorly. Mild atherosclerotic disease at the carotid bifurcation without  significant stenosis.  Vertebral arteries: Vertebral arteries codominant. No vertebral artery stenosis. Both vertebral arteries patent to the basilar.  Skeleton: Anterolisthesis C4-5 appears degenerative with bilateral facet disease. No fracture or acute bone lesion.  Other neck: Negative for mass or adenopathy.  Upper chest: 13 mm nodule right upper lobe anteriorly unchanged. Interval development of 3 cm right apical density most likely pneumonia or atelectasis given its interval development since the CT chest of 07/14/2016.  Review of the MIP images confirms the above findings  CTA HEAD FINDINGS  Anterior circulation: Atherosclerotic calcification in the cavernous carotid bilaterally without stenosis. Anterior and middle cerebral arteries patent bilaterally.  Posterior circulation: Both vertebral arteries contribute to the basilar. PICA patent bilaterally. Basilar is patent. Fetal origin of the posterior cerebral artery bilaterally with hypoplastic distal basilar. Superior cerebellar arteries patent bilaterally. Posterior cerebral arteries patent bilaterally.  Venous sinuses: Patent  Anatomic variants: Negative for aneurysm  Delayed phase: 9 mm enhancing extra-axial mass lesion from the superior surface of the tentorium on the right compatible with meningioma.  Review of the MIP images confirms the above findings  IMPRESSION: Punctate acute infarct right internal capsule best seen on MRI.  Marked cerebellar atrophy  bilaterally with chronic cerebellar ischemia.  Mild atherosclerotic disease in the carotid bifurcation bilaterally without carotid stenosis. No significant vertebral artery stenosis.  No significant intracranial stenosis.  9 mm right tentorial meningioma unchanged.                       Shawon Denzer A. Merlene Paul, M.D.  Diplomate, Tax adviser of Psychiatry and Neurology ( Neurology). 07/17/2016, 6:02 PM

## 2016-07-18 LAB — VITAMIN D 25 HYDROXY (VIT D DEFICIENCY, FRACTURES): Vit D, 25-Hydroxy: 19.2 ng/mL — ABNORMAL LOW (ref 30.0–100.0)

## 2016-07-18 MED ORDER — TRAZODONE HCL 150 MG PO TABS: 75.0000 mg | ORAL_TABLET | Freq: Every day | ORAL | 0 refills | Status: DC

## 2016-07-18 MED ORDER — AMOXICILLIN-POT CLAVULANATE 875-125 MG PO TABS: 1.0000 | ORAL_TABLET | Freq: Two times a day (BID) | ORAL | 0 refills | Status: DC

## 2016-07-18 MED ORDER — GABAPENTIN 300 MG PO CAPS: 300.0000 mg | ORAL_CAPSULE | Freq: Three times a day (TID) | ORAL | 0 refills | Status: DC

## 2016-07-18 MED ORDER — VITAMIN D (ERGOCALCIFEROL) 1.25 MG (50000 UNIT) PO CAPS: 50000.0000 [IU] | ORAL_CAPSULE | ORAL | 0 refills | Status: DC

## 2016-07-18 MED ORDER — VITAMIN D (ERGOCALCIFEROL) 1.25 MG (50000 UNIT) PO CAPS
50000.0000 [IU] | ORAL_CAPSULE | ORAL | Status: DC
  Filled 2016-07-18: qty 1

## 2016-07-18 MED ORDER — ASPIRIN 81 MG PO TBEC: 162.0000 mg | DELAYED_RELEASE_TABLET | Freq: Every day | ORAL | 0 refills | Status: DC

## 2016-07-18 MED ORDER — TRAMADOL HCL 50 MG PO TABS: 25.0000 mg | ORAL_TABLET | Freq: Two times a day (BID) | ORAL | 0 refills | Status: DC | PRN

## 2016-07-18 MED ORDER — METRONIDAZOLE 500 MG PO TABS: 500.0000 mg | ORAL_TABLET | Freq: Three times a day (TID) | ORAL | 0 refills | Status: DC

## 2016-07-18 MED ORDER — GUAIFENESIN ER 600 MG PO TB12: 600.0000 mg | ORAL_TABLET | Freq: Two times a day (BID) | ORAL | 0 refills | Status: DC

## 2016-07-18 MED ORDER — AMITRIPTYLINE HCL 25 MG PO TABS: 25.0000 mg | ORAL_TABLET | Freq: Every day | ORAL | 0 refills | Status: DC

## 2016-07-18 NOTE — Care Management Note (Addendum)
Case Management Note  Patient Details  Name: Krystie Leiter MRN: [id number] Date of Birth: May 14, 1934  Expected Discharge Date:    07/18/2016              Expected Discharge Plan:  [facility] [place]  In-House Referral:  NA  Discharge planning Services  CM Consult  Post Acute Care Choice:  Home Health, Durable Medical Equipment Choice offered to:  Patient, Adult Children, Spouse  DME Arranged:    DME Agency:  [place]. (hoyer lift)  HH Arranged:  RN, PT, OT, Social Work [employer] Agency:  [place]  Status of Service:  Completed, signed off   Additional Comments: Pt discharging home today with [facility] services. Pt is aware that [facility] has 48hrs to make first visit. Romualdo Bolk, of [facility], aware of referral and will obtain pt info from chart. Pt's son at bedside and will transport pt home. Pt's lift has been delivered to pt's home already per son.  Sherald Barge, RN 07/18/2016, 10:25 AM

## 2016-07-18 NOTE — Progress Notes (Signed)
Physical Therapy Treatment Patient Details Name: Neomia Herbel MRN: [id number] DOB: [birth year] Today's Date: 07/18/2016    History of Present Illness 82 y.o. female with hx of cerebella degenration, HLD, HTN, hypothyrodism, CVA, TIA, Ataxia, PAF not on anticoagulation due to ataxia and fall risk, presented to the ER falling and fainted after having diarrhea trying to get up.  Evaluation in the ER included a head CT which showed no acute infarction, but a 10 mm rounded hyperdense structure abutting the paramedian right occipital cortex along the tentorium. This may represent a small amount of subdural hemorrhage, or malformation of the straight sinus. She also has moderate chronic microvascular disease. Areas of hypoattenuation in bilateral cerebellar cortex, likely sequela of prior ischemia.  She also complained of lower leg pain and lower abdominal pain.  Abdominal pelvic CT was done, showing hepatic lesions which were new and suspicous for metatastic lesion.  Radiologist recommended chest CT to look for metastatic source.  She also complained of pain in her legs, originally both legs, but it seems that her pain is mostly on the left hip.  Her serology showed WBC of 13.2 K, and normal Cr.  No LFT was done.  Hospitalist was asked to admit her for syncope, felt to be due to volume depletion because of diarrhea, and leg and hip pain, unable to ambulate.  MRI with acute infarct of the right corpus callosum.  Dx: CVA,  CT scan showed transverse and descending colitis    PT Comments    Pt received in bed, and was agreeable to PT tx.  Pt c/o pain in L thigh, however this diminished with mobility, and seems to have been related to stiffness with immobility.  Pt continues to require Mod A for transfer supine<>sit.  Today, she was able to use the STEDY for transfer bed<>chair and demonstrated improved standing posture and endurance with this device.  Will continue to progress pt's strength for functional  transfers.  Continue to recommend SNF, however pt and family insistent on returning home.    Follow Up Recommendations  SNF;Home health PT;Supervision/Assistance - 24 hour     Equipment Recommendations  Hospital bed;Other (comment) (hospital bed, and hoyer lift if pt ends up going home. )    Recommendations for Other Services       Precautions / Restrictions Precautions Precautions: Fall Precaution Comments: 6-8 falls in the last 6 months.  2 yesterday when transferring from the bed<>w/c.  Restrictions Weight Bearing Restrictions: No    Mobility  Bed Mobility Overal bed mobility: Needs Assistance Bed Mobility: Supine to Sit     Supine to sit: Mod assist     General bed mobility comments: Assist to advance L LE towards the EOB, and assistance to advance trunk towards the [facility].   Transfers Overall transfer level: Needs assistance   Transfers: Sit to/from Stand Sit to Stand: Mod assist Stand pivot transfers: Total assist (with use of SARA STEDY)       General transfer comment: Pt demonstrated improved posture with standing today, as well as improved endurance with standing.    Ambulation/Gait Ambulation/Gait assistance:  (NA due to continued need for increased assist with transfers. )               Stairs            Wheelchair Mobility    Modified Rankin (Stroke Patients Only)       Balance Overall balance assessment: History of Falls;Needs assistance Sitting-balance support: Bilateral upper extremity supported;Feet  supported Sitting balance-Leahy Scale: Poor     Standing balance support: Bilateral upper extremity supported Standing balance-Leahy Scale: Zero Standing balance comment: improved posture with use of the STEDY today, however still with fwd trunk flexion in standing, but improved by at least 50%.                     Cognition Arousal/Alertness: Awake/alert Behavior During Therapy: WFL for tasks assessed/performed Overall  Cognitive Status: Within Functional Limits for tasks assessed                      Exercises      General Comments        Pertinent Vitals/Pain Pain Assessment: Faces Faces Pain Scale: Hurts even more Pain Location: L thigh Pain Descriptors / Indicators: Aching Pain Intervention(s): Limited activity within patient's tolerance;Monitored during session;Repositioned    Home Living                      Prior Function            PT Goals (current goals can now be found in the care plan section) Acute Rehab PT Goals Patient Stated Goal: To go home.  PT Goal Formulation: With patient/family Time For Goal Achievement: 07/22/16 Potential to Achieve Goals: Fair Progress towards PT goals: Progressing toward goals    Frequency    7X/week      PT Plan Current plan remains appropriate    Co-evaluation             End of Session Equipment Utilized During Treatment: Gait belt Activity Tolerance: Patient limited by fatigue Patient left: in chair;with call bell/phone within reach;with family/visitor present     Time:  -     Charges:                       G Codes:      Beth Arby Dahir, PT, DPT X: [phone]

## 2016-07-18 NOTE — Care Management Important Message (Signed)
Important Message  Patient Details  Name: [name] MRN: [id number] Date of Birth: [birth year]   Medicare Important Message Given:  Yes    Sherald Barge, RN 07/18/2016, 10:17 AM

## 2016-07-18 NOTE — Discharge Summary (Signed)
Physician Discharge Summary  Alla Sloma EXH:[id number] DOB: 1933/12/20 DOA: 07/13/2016  PCP: Evelina Dun, FNP  Admit date: 07/13/2016 Discharge date: 07/18/2016  Admitted From: Home  Disposition:  Home   Recommendations for Outpatient Follow-up:  1. Follow up with PCP in 1-2 weeks 2. Please obtain BMP/CBC in one week 3. Needs to have biopsy of liver mass. Follow up resolution of colitis and PNA.   Home Health: yes.  Discharge Condition: stable.  CODE STATUS: Full code.  Diet recommendation: Heart Healthy   Brief/Interim Summary: Acute CVA -MRI with acute infarct of the right corpus callosum. -Start ASA (patient refuses to take because of ?hemangioma of face), LDL 61, ECHO pending, carotid dopplers less than 50 % stenosis.  -PT recs SNF, but patient and husband would prefere. He hom is willing to provide 24 hour supervision. -Neurology consult, appreciated. Plan for CT angio head neck  -This likely explains her acute syncope.  Meningioma -Incidental finding. -no need for further work up.  Liver masses, lung nodule.  -Of unclear significance. -CT chest without major abnormalities. -will need biopsy, when stable from colitis, and acute stroke.  - blood culture no growth to date.   Colitis; patient report abdominal pain, diarrhea, leurkocytosis. CT scan showed transverse and descending colitis.  GI pathogen negative , C diff negative  Started Unasyn and flagyl day 3. Diarrhea improved. She will be discharge on augmentin and flagyl. .  Blood culture no growth to date   Hypothyroidism -Continue synthroid.  A Fib -Rate controlled. -Not good anticoagulation candidate given frequent falls and ataxia.  PNA; Cough; started guaifenesin. Received Unasyn. She will be discharge on 5 more days of Augmentin.   Discharge Diagnoses:  Principal Problem:   Acute CVA (cerebrovascular accident) [facility]) Active Problems:   Cerebellar degeneration   Hypothyroidism   Paroxysmal  atrial fibrillation ([name])   Atrial fibrillation ([name])   Syncope   Diarrhea   Volume depletion   Meningioma ([name])   Liver masses    Discharge Instructions  Discharge Instructions    Diet - low sodium heart healthy    Complete by:  As directed    Increase activity slowly    Complete by:  As directed      Allergies as of 07/18/2016      Reactions   Lisinopril Cough      Medication List    STOP taking these medications   nitrofurantoin 50 MG capsule Commonly known as:  MACRODANTIN     TAKE these medications   ALPRAZolam 0.25 MG tablet Commonly known as:  XANAX Take 1 tablet (0.25 mg total) by mouth at bedtime as needed (or insomnia).   amitriptyline 25 MG tablet Commonly known as:  ELAVIL Take 1 tablet (25 mg total) by mouth at bedtime. What changed:  medication strength   amLODipine 5 MG tablet Commonly known as:  NORVASC TAKE 1 TABLET DAILY   amoxicillin-clavulanate 875-125 MG tablet Commonly known as:  AUGMENTIN Take 1 tablet by mouth 2 (two) times daily.   aspirin 81 MG EC tablet Take 2 tablets (162 mg total) by mouth daily. Start taking on:  07/19/2016   escitalopram 20 MG tablet Commonly known as:  LEXAPRO Take 1 tablet (20 mg total) by mouth at bedtime.   furosemide 40 MG tablet Commonly known as:  LASIX Take 40 mg by mouth daily as needed for fluid.   gabapentin 300 MG capsule Commonly known as:  NEURONTIN Take 1 capsule (300 mg total) by mouth 3 (three) times  daily. What changed:  how much to take  how to take this  when to take this  additional instructions   guaiFENesin 600 MG 12 hr tablet Commonly known as:  MUCINEX Take 1 tablet (600 mg total) by mouth 2 (two) times daily.   levothyroxine 75 MCG tablet Commonly known as:  SYNTHROID, LEVOTHROID TAKE 1 TABLET DAILY BEFORE BREAKFAST   losartan 50 MG tablet Commonly known as:  COZAAR TAKE 1 TABLET DAILY What changed:  See the new instructions.   lovastatin 40 MG  tablet Commonly known as:  MEVACOR Take 1 tablet (40 mg total) by mouth at bedtime.   metroNIDAZOLE 500 MG tablet Commonly known as:  FLAGYL Take 1 tablet (500 mg total) by mouth every 8 (eight) hours.   nystatin 100000 UNIT/ML suspension Commonly known as:  MYCOSTATIN Take 5 mLs (500,000 Units total) by mouth 4 (four) times daily. What changed:  when to take this  reasons to take this   omeprazole 40 MG capsule Commonly known as:  PRILOSEC Take 1 capsule (40 mg total) by mouth daily.   traMADol 50 MG tablet Commonly known as:  ULTRAM Take 0.5 tablets (25 mg total) by mouth every 12 (twelve) hours as needed for severe pain.   traZODone 150 MG tablet Commonly known as:  DESYREL Take 0.5 tablets (75 mg total) by mouth at bedtime. What changed:  how much to take   Vitamin D (Ergocalciferol) 50000 units Caps capsule Commonly known as:  DRISDOL Take 1 capsule (50,000 Units total) by mouth every 7 (seven) days.            Durable Medical Equipment        Start     Ordered   07/16/16 1149  For home use only DME Other see comment  Once    Comments:  Harrel Lemon lift   07/16/16 1148     Follow-up Information    Silvano Rusk, MD Follow up.   Specialty:  Gastroenterology Contact information: [address] [address] [phone]          Allergies  Allergen Reactions  . Lisinopril Cough    Consultations:  Neurology    Procedures/Studies: Ct Angio Head W Or Wo Contrast  Result Date: 07/17/2016 CLINICAL DATA:  Stroke EXAM: CT ANGIOGRAPHY HEAD AND NECK TECHNIQUE: Multidetector CT imaging of the head and neck was performed using the standard protocol during bolus administration of intravenous contrast. Multiplanar CT image reconstructions and MIPs were obtained to evaluate the vascular anatomy. Carotid stenosis measurements (when applicable) are obtained utilizing NASCET criteria, using the distal internal carotid diameter as the denominator.  CONTRAST:  100 mL Isovue 370 IV COMPARISON:  MRI 07/14/2016 FINDINGS: CT HEAD FINDINGS Brain: Marked cerebellar atrophy bilaterally with chronic cerebellar infarcts. Mild cerebral atrophy. Negative for hydrocephalus. Chronic microvascular ischemic change in the white matter. Small acute infarct in the genu internal capsule on the right is best seen on MRI diffusion imaging. Negative for hemorrhage or mass. Skull: Negative Sinuses: Negative Orbits: Right lens replacement.  No orbital mass. Review of the MIP images confirms the above findings CTA NECK FINDINGS Aortic arch: Mild atherosclerotic calcification in the aortic arch without aneurysm. Proximal great vessels widely patent. Right carotid system: . Right common carotid artery widely patent. Small atherosclerotic calcification at the right carotid bifurcation without stenosis. Left carotid system: Left common carotid artery widely patent. This vessel is tortuous anteriorly. Mild atherosclerotic disease at the carotid bifurcation without significant stenosis. Vertebral arteries: Vertebral  arteries codominant. No vertebral artery stenosis. Both vertebral arteries patent to the basilar. Skeleton: Anterolisthesis C4-5 appears degenerative with bilateral facet disease. No fracture or acute bone lesion. Other neck: Negative for mass or adenopathy. Upper chest: 13 mm nodule right upper lobe anteriorly unchanged. Interval development of 3 cm right apical density most likely pneumonia or atelectasis given its interval development since the CT chest of 07/14/2016. Review of the MIP images confirms the above findings CTA HEAD FINDINGS Anterior circulation: Atherosclerotic calcification in the cavernous carotid bilaterally without stenosis. Anterior and middle cerebral arteries patent bilaterally. Posterior circulation: Both vertebral arteries contribute to the basilar. PICA patent bilaterally. Basilar is patent. Fetal origin of the posterior cerebral artery bilaterally with  hypoplastic distal basilar. Superior cerebellar arteries patent bilaterally. Posterior cerebral arteries patent bilaterally. Venous sinuses: Patent Anatomic variants: Negative for aneurysm Delayed phase: 9 mm enhancing extra-axial mass lesion from the superior surface of the tentorium on the right compatible with meningioma. Review of the MIP images confirms the above findings IMPRESSION: Punctate acute infarct right internal capsule best seen on MRI. Marked cerebellar atrophy bilaterally with chronic cerebellar ischemia. Mild atherosclerotic disease in the carotid bifurcation bilaterally without carotid stenosis. No significant vertebral artery stenosis. No significant intracranial stenosis. 9 mm right tentorial meningioma unchanged. Right upper lobe airspace density has developed since the recent chest CT of 07/14/2016, probable pneumonia. 13 mm right upper lobe nodule anteriorly is unchanged and could represent neoplasm. Electronically Signed   By: Franchot Gallo M.D.   On: 07/17/2016 13:49   Ct Head Wo Contrast  Addendum Date: 07/13/2016   ADDENDUM REPORT: 07/13/2016 18:11 ADDENDUM: These results were called by telephone at the time of interpretation on 07/13/2016 at 2:50 pm to Dr. Sabra Heck, who verbally acknowledged these results. Electronically Signed   By: Fidela Salisbury M.D.   On: 07/13/2016 18:11   Result Date: 07/13/2016 CLINICAL DATA:  Generalized pain and weakness.  Diarrhea. EXAM: CT HEAD WITHOUT CONTRAST TECHNIQUE: Contiguous axial images were obtained from the base of the skull through the vertex without intravenous contrast. COMPARISON:  Head MRI 06/07/2015 FINDINGS: Brain: No evidence of acute infarction, or hydrocephalus. There is a 10 mm rounded hyperdense structure abutting the paramedian right occipital cortex along the tentorium. Prominent areas of hypoattenuation within the cortex of bilateral cerebellum are stable, accounting for differences in technique compared to the brain MRI  from November 2016. There is moderate deep white matter microangiopathy and mild brain parenchymal volume loss. Vascular: No hyperdense vessels. Skull: Normal. Negative for fracture or focal lesion. Sinuses/Orbits: No acute finding. Other: None. IMPRESSION: No evidence of acute infarction. 10 mm rounded hyperdense structure abutting the paramedian right occipital cortex along the tentorium. This may represent a small amount of subdural hemorrhage, or malformation of the straight sinus. Further evaluation with brain MRI may be considered if found clinically relevant. Moderate chronic microvascular disease. Areas of hypoattenuation in bilateral cerebellar cortex, likely sequela of prior ischemia. Electronically Signed: By: Fidela Salisbury M.D. On: 07/13/2016 14:46   Ct Angio Neck W Or Wo Contrast  Result Date: 07/17/2016 CLINICAL DATA:  Stroke EXAM: CT ANGIOGRAPHY HEAD AND NECK TECHNIQUE: Multidetector CT imaging of the head and neck was performed using the standard protocol during bolus administration of intravenous contrast. Multiplanar CT image reconstructions and MIPs were obtained to evaluate the vascular anatomy. Carotid stenosis measurements (when applicable) are obtained utilizing NASCET criteria, using the distal internal carotid diameter as the denominator. CONTRAST:  100 mL Isovue 370 IV COMPARISON:  MRI 07/14/2016 FINDINGS: CT HEAD FINDINGS Brain: Marked cerebellar atrophy bilaterally with chronic cerebellar infarcts. Mild cerebral atrophy. Negative for hydrocephalus. Chronic microvascular ischemic change in the white matter. Small acute infarct in the genu internal capsule on the right is best seen on MRI diffusion imaging. Negative for hemorrhage or mass. Skull: Negative Sinuses: Negative Orbits: Right lens replacement.  No orbital mass. Review of the MIP images confirms the above findings CTA NECK FINDINGS Aortic arch: Mild atherosclerotic calcification in the aortic arch without aneurysm.  Proximal great vessels widely patent. Right carotid system: . Right common carotid artery widely patent. Small atherosclerotic calcification at the right carotid bifurcation without stenosis. Left carotid system: Left common carotid artery widely patent. This vessel is tortuous anteriorly. Mild atherosclerotic disease at the carotid bifurcation without significant stenosis. Vertebral arteries: Vertebral arteries codominant. No vertebral artery stenosis. Both vertebral arteries patent to the basilar. Skeleton: Anterolisthesis C4-5 appears degenerative with bilateral facet disease. No fracture or acute bone lesion. Other neck: Negative for mass or adenopathy. Upper chest: 13 mm nodule right upper lobe anteriorly unchanged. Interval development of 3 cm right apical density most likely pneumonia or atelectasis given its interval development since the CT chest of 07/14/2016. Review of the MIP images confirms the above findings CTA HEAD FINDINGS Anterior circulation: Atherosclerotic calcification in the cavernous carotid bilaterally without stenosis. Anterior and middle cerebral arteries patent bilaterally. Posterior circulation: Both vertebral arteries contribute to the basilar. PICA patent bilaterally. Basilar is patent. Fetal origin of the posterior cerebral artery bilaterally with hypoplastic distal basilar. Superior cerebellar arteries patent bilaterally. Posterior cerebral arteries patent bilaterally. Venous sinuses: Patent Anatomic variants: Negative for aneurysm Delayed phase: 9 mm enhancing extra-axial mass lesion from the superior surface of the tentorium on the right compatible with meningioma. Review of the MIP images confirms the above findings IMPRESSION: Punctate acute infarct right internal capsule best seen on MRI. Marked cerebellar atrophy bilaterally with chronic cerebellar ischemia. Mild atherosclerotic disease in the carotid bifurcation bilaterally without carotid stenosis. No significant vertebral  artery stenosis. No significant intracranial stenosis. 9 mm right tentorial meningioma unchanged. Right upper lobe airspace density has developed since the recent chest CT of 07/14/2016, probable pneumonia. 13 mm right upper lobe nodule anteriorly is unchanged and could represent neoplasm. Electronically Signed   By: Franchot Gallo M.D.   On: 07/17/2016 13:49   Ct Chest W Contrast  Result Date: 07/14/2016 CLINICAL DATA:  Followup left lung nodules seen on an abdomen CT yesterday. Suspected liver metastases on the CT. EXAM: CT CHEST WITH CONTRAST TECHNIQUE: Multidetector CT imaging of the chest was performed during intravenous contrast administration. CONTRAST:  49m ISOVUE-300 IOPAMIDOL (ISOVUE-300) INJECTION 61% COMPARISON:  Abdomen and pelvis CT obtained yesterday. Previous chest CT examinations dated 05/10/2015, 12/30/2013 and 10/27/2013. FINDINGS: Cardiovascular: Coronary artery and aortic calcifications. Mildly enlarged heart. Mediastinum/Nodes: No enlarged lymph nodes. Lungs/Pleura: The 5 mm nodule seen in the medial aspect of the left lower lobe yesterday measures 5 mm on image number 71 of series 3 today. The previously demonstrated 4 mm nodule in the left lower lobe is currently obscured by atelectasis. There is interval mild bilateral lower lobe atelectasis as well as mild dependent atelectasis in the left upper lobe. The 1.1 x 0.9 cm right middle lobe nodule seen on 05/10/2015 and on previous examinations is difficult to measure today due to interval associated atelectasis extending more medially. The nodule itself measures approximately 1.3 x 1.0 cm on image number 37 of series 3. The 2 left lower  lobe nodules seen yesterday are unchanged since 10/27/2013. No pleural fluid. Upper Abdomen: The liver masses seen yesterday are again demonstrated. These are less well visualized due to later phase imaging. Musculoskeletal: Mild thoracic spine degenerative changes. Old, healed sternal fracture. No  evidence of bony metastatic disease. IMPRESSION: 1. The size of the previously demonstrated right middle lobe nodule is difficult to assess due to interval mild atelectasis medially. This may have increased minimally in size since 05/10/2015 and is indeterminate for the possibility of malignancy. 2. No significant change in an the recently demonstrated left lung nodules since 10/27/2013, compatible with a benign process. 3. Stable liver masses suspicious for metastases. Electronically Signed   By: Claudie Revering M.D.   On: 07/14/2016 11:31   Mr Jeri Cos IR Contrast  Result Date: 07/14/2016 CLINICAL DATA:  Generalized weakness.  Abnormal head CT. EXAM: MRI HEAD WITHOUT AND WITH CONTRAST TECHNIQUE: Multiplanar, multiecho pulse sequences of the brain and surrounding structures were obtained without and with intravenous contrast. CONTRAST:  64m MULTIHANCE GADOBENATE DIMEGLUMINE 529 MG/ML IV SOLN COMPARISON:  Head CT 07/13/2016.  MRI 06/07/2015. FINDINGS: Brain: Diffusion imaging shows a 4 mm acute infarction at the genu of the internal capsule on the right. No other acute infarction. There are extensive old infarctions throughout the cerebellum. Chronic small-vessel ischemic changes affect the pons. Cerebral hemispheres show extensive chronic small-vessel ischemic changes throughout the deep and subcortical white matter. No evidence of intra-axial mass lesion, hemorrhage, hydrocephalus or extra-axial fluid collection. There is a 9 mm meningioma along the right side of the falx tentorial junction responsible for of the CT finding. No evidence of of venous invasion at this time. Mass-effect on the hand. Not present on the previous studies. Vascular: Major vessels at the base of the brain show flow. Skull and upper cervical spine: Negative Sinuses/Orbits: Clear/normal Other: None significant IMPRESSION: 4 mm acute infarction in the genu of the corpus callosum on the right. Extensive chronic small vessel ischemic changes  throughout brain. Numerous old cerebellar infarctions. 9 mm meningioma on the right at the junction of the falx and tentorium, newly seen since last year, not significant at this time. Electronically Signed   By: MNelson ChimesM.D.   On: 07/14/2016 11:09   Ct Abdomen Pelvis W Contrast  Result Date: 07/13/2016 CLINICAL DATA:  Abdominal pain and generalized weakness and thick thick and this morning. Diarrhea. EXAM: CT ABDOMEN AND PELVIS WITH CONTRAST TECHNIQUE: Multidetector CT imaging of the abdomen and pelvis was performed using the standard protocol following bolus administration of intravenous contrast. CONTRAST:  342mISOVUE-300 IOPAMIDOL (ISOVUE-300) INJECTION 61%, 10042mSOVUE-300 IOPAMIDOL (ISOVUE-300) INJECTION 61% COMPARISON:  CT scan 02/19/2008 FINDINGS: Lower chest: The lung bases are clear of acute process. No infiltrates or effusions. There is a 5 mm pulmonary nodule in the left lower lobe on image number 12 and a 4 mm left lower lobe pulmonary nodule on image number 6. I believe both pulmonary nodules were present on the prior CT and not changed. Stable coronary artery calcifications. The heart is normal in size. No pericardial effusion. The distal esophagus is grossly normal. Moderate atherosclerotic calcifications involving the distal descending thoracic aorta. Hepatobiliary: There are ill-defined liver lesions which are worrisome for new hepatic metastatic disease. The largest lesion is in segment 8 and measures 2.5 cm on image 20. There is also a 2.2 cm segment 3 lesion on image number 23. New line no intrahepatic biliary dilatation. The gallbladder is normal. No common bile duct dilatation. Pancreas:  No mass, inflammation or ductal dilatation. Moderate fatty change noted in the pancreatic head. Spleen: Normal size.  No focal lesions. Adrenals/Urinary Tract: The adrenal glands and kidneys are unremarkable. Fetal lobulations versus remote scarring changes. A lower pole left renal calculus is  noted. Stomach/Bowel: The stomach, duodenum, small bowel and colon are grossly normal. There is mild wall thickening and submucosal edema involving the transverse and descending colon suggesting colitis and likely response for the patient's diarrhea. I do not see a definite colon cancer to account for liver lesions. The terminal ileum is normal. Vascular/Lymphatic: Advanced atherosclerotic calcifications involving the aorta and branch vessel ostia. No mesenteric or retroperitoneal mass or adenopathy. The major venous structures are patent. Reproductive: The uterus is surgically absent. The right ovary is still present and appears normal. I do not see the left ovary for certain. Other: Bladder is normal. No pelvic mass or adenopathy. No free pelvic fluid collections. No inguinal mass or adenopathy. No abdominal wall hernia or subcutaneous lesions. Musculoskeletal: No significant bony findings. IMPRESSION: 1. Stable left pulmonary nodules. 2. The hepatic lesions are worrisome for metastatic disease. No obvious primary tumor is identified. Specifically no obvious gastric, pancreatic or colon cancer. Chest CT may be helpful to exclude lung cancer or breast cancer. 3. Liver lesions should be amenable to image guided biopsy if clinically necessary. 4. Suspect transverse and descending colon colitis likely accounting for the patient's diarrhea. Electronically Signed   By: Marijo Sanes M.D.   On: 07/13/2016 14:45   Ct Hip Left Wo Contrast  Result Date: 07/13/2016 CLINICAL DATA:  Complains of generalized pain worse in the legs sharp intermittent pain to left hip EXAM: CT OF THE LEFT HIP WITHOUT CONTRAST TECHNIQUE: Multidetector CT imaging of the left hip was performed according to the standard protocol. Multiplanar CT image reconstructions were also generated. COMPARISON:  07/13/2016, 05/07/2014 FINDINGS: Bones/Joint/Cartilage No acute displaced fracture or dislocation is evident. Joint space compartment appears  relatively maintained. Pubic symphysis and rami appear intact. Proximal femur without fracture. Tiny sclerotic focus left ischium possible bone island, no lytic lesions are visualized. Ligaments Suboptimally assessed by CT. Muscles and Tendons Mild fatty atrophy of the left hip musculature. No soft tissue mass. Soft tissues Mild left femoral artery calcification. No significant hip effusion. Residual contrast material within the bladder. IMPRESSION: No acute osseous abnormality. No significant hip effusion or suspicious bone lesion. Residual contrast within the bladder. Electronically Signed   By: Donavan Foil M.D.   On: 07/13/2016 20:07   US Carotid Bilateral (at [facility] Only)  Result Date: 07/16/2016 CLINICAL DATA:  Stroke. EXAM: BILATERAL CAROTID DUPLEX ULTRASOUND TECHNIQUE: Pearline Cables scale imaging, color Doppler and duplex ultrasound were performed of bilateral carotid and vertebral arteries in the neck. COMPARISON:  MRI 07/14/2016. FINDINGS: Criteria: Quantification of carotid stenosis is based on velocity parameters that correlate the residual internal carotid diameter with NASCET-based stenosis levels, using the diameter of the distal internal carotid lumen as the denominator for stenosis measurement. Exam was limited due to patient positioning and movement. The following velocity measurements were obtained: RIGHT ICA:  125/15 cm/sec CCA:  767/34 cm/sec SYSTOLIC ICA/CCA RATIO:  0.8 DIASTOLIC ICA/CCA RATIO:  1.0 ECA:  161 cm/sec LEFT ICA:  116/19 cm/sec CCA:  193/7 cm/sec SYSTOLIC ICA/CCA RATIO:  1.1 DIASTOLIC ICA/CCA RATIO:  2.3 ECA:  150 cm/sec RIGHT CAROTID ARTERY: Diffuse intimal thickening. Mild right carotid bifurcation atherosclerotic vascular plaque. No flow limiting stenosis. RIGHT VERTEBRAL ARTERY:  Patent with antegrade flow. LEFT CAROTID  ARTERY: Diffuse intimal thickening. Mild left carotid bifurcation atherosclerotic vascular plaque. No flow limiting stenosis. LEFT VERTEBRAL ARTERY:  Patent  with antegrade flow. IMPRESSION: 1. Diffuse bilateral wall thickening. Mild bilateral carotid bifurcation atherosclerotic vascular plaque. No flow limiting stenosis. Degree of stenosis less than 50% bilaterally. 2. Vertebral arteries are patent with antegrade flow. Electronically Signed   By: Marcello Moores  Register   On: 07/16/2016 06:53   Dg Hip Unilat W Or Wo Pelvis 2-3 Views Left  Result Date: 07/13/2016 CLINICAL DATA:  Left hip pain.  Fall. EXAM: DG HIP (WITH OR WITHOUT PELVIS) 2-3V LEFT COMPARISON:  CT abdomen and pelvis performed earlier today.  The FINDINGS: Early symmetric degenerative changes in the hips bilaterally. SI joints are symmetric and unremarkable. No acute bony abnormality. Specifically, no fracture, subluxation, or dislocation. Soft tissues are intact. Oral contrast material noted within bowel loops in the right lower quadrant. Contrast material noted within the bladder. IMPRESSION: No acute bony abnormality. Electronically Signed   By: Rolm Baptise M.D.   On: 07/13/2016 18:16      Subjective: She is feeling better. Complaining of chronic leg pain. Just got some IV pain meds.    Discharge Exam: Vitals:   07/18/16 0100 07/18/16 0500  BP: (!) 149/62 (!) 141/65  Pulse: 85 83  Resp: 19 19  Temp: 98.5 F (36.9 C) 97.7 F (36.5 C)   Vitals:   07/17/16 1700 07/17/16 2100 07/18/16 0100 07/18/16 0500  BP: 132/71 (!) 143/62 (!) 149/62 (!) 141/65  Pulse: 72 89 85 83  Resp: '20 17 19 19  '$ Temp: 98.5 F (36.9 C) 97.8 F (36.6 C) 98.5 F (36.9 C) 97.7 F (36.5 C)  TempSrc: Oral Oral Oral Oral  SpO2: 98% 98% 96% 95%  Weight:      Height:        General: Pt is alert, awake, not in acute distress Cardiovascular: RRR, S1/S2 +, no rubs, no gallops Respiratory: CTA bilaterally, no wheezing, no rhonchi Abdominal: Soft, NT, ND, bowel sounds + Extremities: no edema, no cyanosis    The results of significant diagnostics from this hospitalization (including imaging, microbiology,  ancillary and laboratory) are listed below for reference.     Microbiology: Recent Results (from the past 240 hour(s))  C difficile quick scan w PCR reflex     Status: None   Collection Time: 07/15/16 10:20 AM  Result Value Ref Range Status   C Diff antigen NEGATIVE NEGATIVE Final   C Diff toxin NEGATIVE NEGATIVE Final   C Diff interpretation No C. difficile detected.  Final  Gastrointestinal Panel by PCR , Stool     Status: None   Collection Time: 07/15/16 10:50 AM  Result Value Ref Range Status   Campylobacter species NOT DETECTED NOT DETECTED Final   Plesimonas shigelloides NOT DETECTED NOT DETECTED Final   Salmonella species NOT DETECTED NOT DETECTED Final   Yersinia enterocolitica NOT DETECTED NOT DETECTED Final   Vibrio species NOT DETECTED NOT DETECTED Final   Vibrio cholerae NOT DETECTED NOT DETECTED Final   Enteroaggregative E coli (EAEC) NOT DETECTED NOT DETECTED Final   Enteropathogenic E coli (EPEC) NOT DETECTED NOT DETECTED Final   Enterotoxigenic E coli (ETEC) NOT DETECTED NOT DETECTED Final   Shiga like toxin producing E coli (STEC) NOT DETECTED NOT DETECTED Final   Shigella/Enteroinvasive E coli (EIEC) NOT DETECTED NOT DETECTED Final   Cryptosporidium NOT DETECTED NOT DETECTED Final   Cyclospora cayetanensis NOT DETECTED NOT DETECTED Final   Entamoeba histolytica NOT  DETECTED NOT DETECTED Final   Giardia lamblia NOT DETECTED NOT DETECTED Final   Adenovirus F40/41 NOT DETECTED NOT DETECTED Final   Astrovirus NOT DETECTED NOT DETECTED Final   Norovirus GI/GII NOT DETECTED NOT DETECTED Final   Rotavirus A NOT DETECTED NOT DETECTED Final   Sapovirus (I, II, IV, and V) NOT DETECTED NOT DETECTED Final  Culture, blood (routine x 2)     Status: None (Preliminary result)   Collection Time: 07/16/16 11:10 AM  Result Value Ref Range Status   Specimen Description BLOOD LEFT FOREARM  Final   Special Requests BOTTLES DRAWN AEROBIC ONLY 4CC  Final   Culture NO GROWTH < 24  HOURS  Final   Report Status PENDING  Incomplete  Culture, blood (routine x 2)     Status: None (Preliminary result)   Collection Time: 07/16/16 11:10 AM  Result Value Ref Range Status   Specimen Description BLOOD LEFT HAND  Final   Special Requests BOTTLES DRAWN AEROBIC ONLY 4CC  Final   Culture NO GROWTH < 24 HOURS  Final   Report Status PENDING  Incomplete     Labs: BNP (last 3 results)  Recent Labs  10/23/15 2112  BNP 58.0   Basic Metabolic Panel:  Recent Labs Lab 07/13/16 1214 07/14/16 0600 07/16/16 0752  NA 135 135 135  K 4.1 3.7 3.6  CL 102 102 105  CO2 '25 25 24  '$ GLUCOSE 140* 137* 148*  BUN '13 16 13  '$ CREATININE 1.04* 0.98 0.84  CALCIUM 9.8 9.0 8.8*   Liver Function Tests:  Recent Labs Lab 07/14/16 0600  AST 47*  ALT 18  ALKPHOS 67  BILITOT 0.5  PROT 6.0*  ALBUMIN 3.2*   No results for input(s): LIPASE, AMYLASE in the last 168 hours. No results for input(s): AMMONIA in the last 168 hours. CBC:  Recent Labs Lab 07/13/16 1214 07/14/16 0600 07/16/16 0752  WBC 13.2* 15.9* 11.9*  HGB 13.4 12.2 11.7*  HCT 42.0 37.3 35.6*  MCV 93.8 92.6 92.0  PLT 202 210 184   Cardiac Enzymes:  Recent Labs Lab 07/13/16 1231  CKTOTAL 34*   BNP: Invalid input(s): POCBNP CBG:  Recent Labs Lab 07/13/16 1248 07/13/16 1321  GLUCAP 138* 94   D-Dimer No results for input(s): DDIMER in the last 72 hours. Hgb A1c No results for input(s): HGBA1C in the last 72 hours. Lipid Profile No results for input(s): CHOL, HDL, LDLCALC, TRIG, CHOLHDL, LDLDIRECT in the last 72 hours. Thyroid function studies No results for input(s): TSH, T4TOTAL, T3FREE, THYROIDAB in the last 72 hours.  Invalid input(s): FREET3 Anemia work up  Recent Labs  07/17/16 1126  VITAMINB12 319   Urinalysis    Component Value Date/Time   COLORURINE YELLOW 07/13/2016 1919   APPEARANCEUR HAZY (A) 07/13/2016 1919   APPEARANCEUR Clear 02/17/2016 1707   LABSPEC 1.040 (H) 07/13/2016 1919    PHURINE 5.0 07/13/2016 1919   GLUCOSEU NEGATIVE 07/13/2016 1919   HGBUR NEGATIVE 07/13/2016 [place] NEGATIVE 07/13/2016 1919   BILIRUBINUR Negative 02/17/2016 1707   [place] 07/13/2016 1919   PROTEINUR NEGATIVE 07/13/2016 1919   UROBILINOGEN negative 05/24/2015 1434   NITRITE NEGATIVE 07/13/2016 1919   LEUKOCYTESUR NEGATIVE 07/13/2016 1919   LEUKOCYTESUR Negative 02/17/2016 1707   Sepsis Labs Invalid input(s): PROCALCITONIN,  WBC,  LACTICIDVEN Microbiology Recent Results (from the past 240 hour(s))  C difficile quick scan w PCR reflex     Status: None   Collection Time: 07/15/16 10:20 AM  Result Value Ref Range Status   C Diff antigen NEGATIVE NEGATIVE Final   C Diff toxin NEGATIVE NEGATIVE Final   C Diff interpretation No C. difficile detected.  Final  Gastrointestinal Panel by PCR , Stool     Status: None   Collection Time: 07/15/16 10:50 AM  Result Value Ref Range Status   Campylobacter species NOT DETECTED NOT DETECTED Final   Plesimonas shigelloides NOT DETECTED NOT DETECTED Final   Salmonella species NOT DETECTED NOT DETECTED Final   Yersinia enterocolitica NOT DETECTED NOT DETECTED Final   Vibrio species NOT DETECTED NOT DETECTED Final   Vibrio cholerae NOT DETECTED NOT DETECTED Final   Enteroaggregative E coli (EAEC) NOT DETECTED NOT DETECTED Final   Enteropathogenic E coli (EPEC) NOT DETECTED NOT DETECTED Final   Enterotoxigenic E coli (ETEC) NOT DETECTED NOT DETECTED Final   Shiga like toxin producing E coli (STEC) NOT DETECTED NOT DETECTED Final   Shigella/Enteroinvasive E coli (EIEC) NOT DETECTED NOT DETECTED Final   Cryptosporidium NOT DETECTED NOT DETECTED Final   Cyclospora cayetanensis NOT DETECTED NOT DETECTED Final   Entamoeba histolytica NOT DETECTED NOT DETECTED Final   Giardia lamblia NOT DETECTED NOT DETECTED Final   Adenovirus F40/41 NOT DETECTED NOT DETECTED Final   Astrovirus NOT DETECTED NOT DETECTED Final   Norovirus GI/GII  NOT DETECTED NOT DETECTED Final   Rotavirus A NOT DETECTED NOT DETECTED Final   Sapovirus (I, II, IV, and V) NOT DETECTED NOT DETECTED Final  Culture, blood (routine x 2)     Status: None (Preliminary result)   Collection Time: 07/16/16 11:10 AM  Result Value Ref Range Status   Specimen Description BLOOD LEFT FOREARM  Final   Special Requests BOTTLES DRAWN AEROBIC ONLY 4CC  Final   Culture NO GROWTH < 24 HOURS  Final   Report Status PENDING  Incomplete  Culture, blood (routine x 2)     Status: None (Preliminary result)   Collection Time: 07/16/16 11:10 AM  Result Value Ref Range Status   Specimen Description BLOOD LEFT HAND  Final   Special Requests BOTTLES DRAWN AEROBIC ONLY 4CC  Final   Culture NO GROWTH < 24 HOURS  Final   Report Status PENDING  Incomplete     Time coordinating discharge: Over 30 minutes  SIGNED:   Elmarie Shiley, MD  [facility] 07/18/2016, 10:01 AM Pager   If 7PM-7AM, please contact night-coverage [email or web address] Password [email or web address]

## 2016-07-19 ENCOUNTER — Telehealth: Payer: Self-pay | Admitting: Cardiology

## 2016-07-19 ENCOUNTER — Telehealth: Payer: Self-pay | Admitting: *Deleted

## 2016-07-19 DIAGNOSIS — D329 Benign neoplasm of meninges, unspecified: Secondary | ICD-10-CM | POA: Diagnosis not present

## 2016-07-19 DIAGNOSIS — G319 Degenerative disease of nervous system, unspecified: Secondary | ICD-10-CM | POA: Diagnosis not present

## 2016-07-19 DIAGNOSIS — I69328 Other speech and language deficits following cerebral infarction: Secondary | ICD-10-CM | POA: Diagnosis not present

## 2016-07-19 DIAGNOSIS — I69354 Hemiplegia and hemiparesis following cerebral infarction affecting left non-dominant side: Secondary | ICD-10-CM | POA: Diagnosis not present

## 2016-07-19 DIAGNOSIS — J189 Pneumonia, unspecified organism: Secondary | ICD-10-CM | POA: Diagnosis not present

## 2016-07-19 DIAGNOSIS — I480 Paroxysmal atrial fibrillation: Secondary | ICD-10-CM | POA: Diagnosis not present

## 2016-07-19 DIAGNOSIS — R270 Ataxia, unspecified: Secondary | ICD-10-CM | POA: Diagnosis not present

## 2016-07-19 DIAGNOSIS — G40909 Epilepsy, unspecified, not intractable, without status epilepticus: Secondary | ICD-10-CM | POA: Diagnosis not present

## 2016-07-19 DIAGNOSIS — K7689 Other specified diseases of liver: Secondary | ICD-10-CM | POA: Diagnosis not present

## 2016-07-19 DIAGNOSIS — Q2739 Arteriovenous malformation, other site: Secondary | ICD-10-CM | POA: Diagnosis not present

## 2016-07-19 DIAGNOSIS — K529 Noninfective gastroenteritis and colitis, unspecified: Secondary | ICD-10-CM | POA: Diagnosis not present

## 2016-07-19 NOTE — Telephone Encounter (Signed)
Call Completed and Appointment Scheduled: Yes, Date: 08/02/16 with Dr Emiliano Dyer INFORMATION Date of Discharge:07/18/16  Discharge Facility: [facility]  Principal Discharge Diagnosis: Gastroenteritis  Patient and/or caregiver is knowledgeable of his/her condition(s) and treatment: Yes  MEDICATION RECONCILIATION Current medication list reviewed with patient:Yes  Outpatient Encounter Prescriptions as of 07/19/2016  Medication Sig  . ALPRAZolam (XANAX) 0.25 MG tablet Take 1 tablet (0.25 mg total) by mouth at bedtime as needed (or insomnia).  Marland Kitchen amitriptyline (ELAVIL) 25 MG tablet Take 1 tablet (25 mg total) by mouth at bedtime.  Marland Kitchen amLODipine (NORVASC) 5 MG tablet TAKE 1 TABLET DAILY  . amoxicillin-clavulanate (AUGMENTIN) 875-125 MG tablet Take 1 tablet by mouth 2 (two) times daily.  Marland Kitchen aspirin EC 81 MG EC tablet Take 2 tablets (162 mg total) by mouth daily.  Marland Kitchen escitalopram (LEXAPRO) 20 MG tablet Take 1 tablet (20 mg total) by mouth at bedtime.  . furosemide (LASIX) 40 MG tablet Take 40 mg by mouth daily as needed for fluid.  Marland Kitchen gabapentin (NEURONTIN) 300 MG capsule Take 1 capsule (300 mg total) by mouth 3 (three) times daily.  Marland Kitchen guaiFENesin (MUCINEX) 600 MG 12 hr tablet Take 1 tablet (600 mg total) by mouth 2 (two) times daily.  Marland Kitchen levothyroxine (SYNTHROID, LEVOTHROID) 75 MCG tablet TAKE 1 TABLET DAILY BEFORE BREAKFAST  . losartan (COZAAR) 50 MG tablet TAKE 1 TABLET DAILY  . lovastatin (MEVACOR) 40 MG tablet Take 1 tablet (40 mg total) by mouth at bedtime.  . metroNIDAZOLE (FLAGYL) 500 MG tablet Take 1 tablet (500 mg total) by mouth every 8 (eight) hours.  Marland Kitchen nystatin (MYCOSTATIN) 100000 UNIT/ML suspension Take 5 mLs (500,000 Units total) by mouth 4 (four) times daily. (Patient taking differently: Take 5 mLs by mouth daily as needed (for oral yeast/irritation). )  . omeprazole (PRILOSEC) 40 MG capsule Take 1 capsule (40 mg total) by mouth daily.  . traMADol (ULTRAM) 50 MG tablet Take  0.5 tablets (25 mg total) by mouth every 12 (twelve) hours as needed for severe pain.  . traZODone (DESYREL) 150 MG tablet Take 0.5 tablets (75 mg total) by mouth at bedtime.  . Vitamin D, Ergocalciferol, (DRISDOL) 50000 units CAPS capsule Take 1 capsule (50,000 Units total) by mouth every 7 (seven) days.   No facility-administered encounter medications on file as of 07/19/2016.     Discharge Medications reviewed and reconciled with current medications.yes  Patient is able to obtain needed medications:Yes  ACTIVITIES OF DAILY LIVING  Is the patient able to perform his/her own ADLs: No.Has assistance from husband  Patient is receiving home health services: Yes.  Home health nurse was there this morning to do intake and plan of care.  PATIENT EDUCATION Questions/Concerns Discussed: No concerns or questions at this time. Her husband will contact us if neccessary.

## 2016-07-19 NOTE — Telephone Encounter (Signed)
Spoke w/ pt husband and requested that pt send a remote transmission b/c her monitor has not updated in the last 14 days. Pt has been in the hospital b/c she had a stroke. Pt husband said they would do it when the pt is feeling better.

## 2016-07-20 DIAGNOSIS — I69354 Hemiplegia and hemiparesis following cerebral infarction affecting left non-dominant side: Secondary | ICD-10-CM | POA: Diagnosis not present

## 2016-07-20 DIAGNOSIS — R270 Ataxia, unspecified: Secondary | ICD-10-CM | POA: Diagnosis not present

## 2016-07-20 DIAGNOSIS — I480 Paroxysmal atrial fibrillation: Secondary | ICD-10-CM | POA: Diagnosis not present

## 2016-07-20 DIAGNOSIS — I69328 Other speech and language deficits following cerebral infarction: Secondary | ICD-10-CM | POA: Diagnosis not present

## 2016-07-20 DIAGNOSIS — K529 Noninfective gastroenteritis and colitis, unspecified: Secondary | ICD-10-CM | POA: Diagnosis not present

## 2016-07-20 DIAGNOSIS — G319 Degenerative disease of nervous system, unspecified: Secondary | ICD-10-CM | POA: Diagnosis not present

## 2016-07-21 LAB — CULTURE, BLOOD (ROUTINE X 2)
CULTURE: NO GROWTH
CULTURE: NO GROWTH

## 2016-07-24 DIAGNOSIS — K529 Noninfective gastroenteritis and colitis, unspecified: Secondary | ICD-10-CM | POA: Diagnosis not present

## 2016-07-24 DIAGNOSIS — R270 Ataxia, unspecified: Secondary | ICD-10-CM | POA: Diagnosis not present

## 2016-07-24 DIAGNOSIS — G319 Degenerative disease of nervous system, unspecified: Secondary | ICD-10-CM | POA: Diagnosis not present

## 2016-07-24 DIAGNOSIS — I480 Paroxysmal atrial fibrillation: Secondary | ICD-10-CM | POA: Diagnosis not present

## 2016-07-24 DIAGNOSIS — I69328 Other speech and language deficits following cerebral infarction: Secondary | ICD-10-CM | POA: Diagnosis not present

## 2016-07-24 DIAGNOSIS — I69354 Hemiplegia and hemiparesis following cerebral infarction affecting left non-dominant side: Secondary | ICD-10-CM | POA: Diagnosis not present

## 2016-07-26 DIAGNOSIS — I480 Paroxysmal atrial fibrillation: Secondary | ICD-10-CM | POA: Diagnosis not present

## 2016-07-26 DIAGNOSIS — I69354 Hemiplegia and hemiparesis following cerebral infarction affecting left non-dominant side: Secondary | ICD-10-CM | POA: Diagnosis not present

## 2016-07-26 DIAGNOSIS — G319 Degenerative disease of nervous system, unspecified: Secondary | ICD-10-CM | POA: Diagnosis not present

## 2016-07-26 DIAGNOSIS — R270 Ataxia, unspecified: Secondary | ICD-10-CM | POA: Diagnosis not present

## 2016-07-26 DIAGNOSIS — K529 Noninfective gastroenteritis and colitis, unspecified: Secondary | ICD-10-CM | POA: Diagnosis not present

## 2016-07-26 DIAGNOSIS — I69328 Other speech and language deficits following cerebral infarction: Secondary | ICD-10-CM | POA: Diagnosis not present

## 2016-07-27 ENCOUNTER — Telehealth: Payer: Self-pay | Admitting: Pediatrics

## 2016-07-27 DIAGNOSIS — I480 Paroxysmal atrial fibrillation: Secondary | ICD-10-CM | POA: Diagnosis not present

## 2016-07-27 DIAGNOSIS — R270 Ataxia, unspecified: Secondary | ICD-10-CM | POA: Diagnosis not present

## 2016-07-27 DIAGNOSIS — K529 Noninfective gastroenteritis and colitis, unspecified: Secondary | ICD-10-CM | POA: Diagnosis not present

## 2016-07-27 DIAGNOSIS — G319 Degenerative disease of nervous system, unspecified: Secondary | ICD-10-CM | POA: Diagnosis not present

## 2016-07-27 DIAGNOSIS — I69354 Hemiplegia and hemiparesis following cerebral infarction affecting left non-dominant side: Secondary | ICD-10-CM | POA: Diagnosis not present

## 2016-07-27 DIAGNOSIS — I69328 Other speech and language deficits following cerebral infarction: Secondary | ICD-10-CM | POA: Diagnosis not present

## 2016-07-27 NOTE — Telephone Encounter (Signed)
She would need to be seen for refill, colitis should have resolved by now.

## 2016-07-31 ENCOUNTER — Ambulatory Visit (INDEPENDENT_AMBULATORY_CARE_PROVIDER_SITE_OTHER): Payer: Medicare Other | Admitting: *Deleted

## 2016-07-31 ENCOUNTER — Other Ambulatory Visit: Payer: Self-pay | Admitting: Nurse Practitioner

## 2016-07-31 DIAGNOSIS — G319 Degenerative disease of nervous system, unspecified: Secondary | ICD-10-CM | POA: Diagnosis not present

## 2016-07-31 DIAGNOSIS — R001 Bradycardia, unspecified: Secondary | ICD-10-CM

## 2016-07-31 DIAGNOSIS — I69328 Other speech and language deficits following cerebral infarction: Secondary | ICD-10-CM | POA: Diagnosis not present

## 2016-07-31 DIAGNOSIS — R270 Ataxia, unspecified: Secondary | ICD-10-CM | POA: Diagnosis not present

## 2016-07-31 DIAGNOSIS — I69354 Hemiplegia and hemiparesis following cerebral infarction affecting left non-dominant side: Secondary | ICD-10-CM | POA: Diagnosis not present

## 2016-07-31 DIAGNOSIS — I480 Paroxysmal atrial fibrillation: Secondary | ICD-10-CM | POA: Diagnosis not present

## 2016-07-31 DIAGNOSIS — K529 Noninfective gastroenteritis and colitis, unspecified: Secondary | ICD-10-CM | POA: Diagnosis not present

## 2016-07-31 MED ORDER — NYSTATIN 100000 UNIT/ML MT SUSP: 5.0000 mL | Freq: Four times a day (QID) | OROMUCOSAL | 0 refills | Status: DC

## 2016-07-31 NOTE — Telephone Encounter (Signed)
Patient aware that she will need to be seen for refill on flaygl. But would like Nystatin sent in before appt.  Patient states that she has yeast real bad in her mouth to the point where she can not eat.

## 2016-07-31 NOTE — Telephone Encounter (Signed)
Nystatin sent to pharmacy- will need to be seen for flagyl refill

## 2016-08-01 DIAGNOSIS — R270 Ataxia, unspecified: Secondary | ICD-10-CM | POA: Diagnosis not present

## 2016-08-01 DIAGNOSIS — I480 Paroxysmal atrial fibrillation: Secondary | ICD-10-CM | POA: Diagnosis not present

## 2016-08-01 DIAGNOSIS — G319 Degenerative disease of nervous system, unspecified: Secondary | ICD-10-CM | POA: Diagnosis not present

## 2016-08-01 DIAGNOSIS — K529 Noninfective gastroenteritis and colitis, unspecified: Secondary | ICD-10-CM | POA: Diagnosis not present

## 2016-08-01 DIAGNOSIS — I69328 Other speech and language deficits following cerebral infarction: Secondary | ICD-10-CM | POA: Diagnosis not present

## 2016-08-01 DIAGNOSIS — I69354 Hemiplegia and hemiparesis following cerebral infarction affecting left non-dominant side: Secondary | ICD-10-CM | POA: Diagnosis not present

## 2016-08-01 NOTE — Progress Notes (Signed)
[facility] Summary Report 

## 2016-08-02 ENCOUNTER — Encounter: Payer: Self-pay | Admitting: Pediatrics

## 2016-08-02 ENCOUNTER — Ambulatory Visit (INDEPENDENT_AMBULATORY_CARE_PROVIDER_SITE_OTHER): Payer: Medicare Other | Admitting: Pediatrics

## 2016-08-02 VITALS — BP 134/82 | HR 89 | Temp 96.7°F | Ht 64.0 in | Wt 223.2 lb

## 2016-08-02 DIAGNOSIS — R160 Hepatomegaly, not elsewhere classified: Secondary | ICD-10-CM

## 2016-08-02 DIAGNOSIS — D649 Anemia, unspecified: Secondary | ICD-10-CM

## 2016-08-02 DIAGNOSIS — G4700 Insomnia, unspecified: Secondary | ICD-10-CM

## 2016-08-02 DIAGNOSIS — I6930 Unspecified sequelae of cerebral infarction: Secondary | ICD-10-CM | POA: Diagnosis not present

## 2016-08-02 MED ORDER — MELATONIN 3 MG PO TBDP: 3.0000 mg | ORAL_TABLET | Freq: Every evening | ORAL | 1 refills | Status: DC | PRN

## 2016-08-02 NOTE — Progress Notes (Signed)
  Subjective:   Patient ID: [name], female    DOB: 1934/01/31, 83 y.o.   MRN: [id number] CC:  (Stroke 2 weeks ago) and multiple med problem f/u HPI: [name] is a 83 y.o. female presenting for Transitional Care Management (Stroke 2 weeks ago)  Has been tired, has been doing all of her exercises at home HR  Primarily L leg affected Says sometimes L leg doesn't always  Liver masses: incidental finding, suspicious for metastases Colonoscopy in 02/2016  Recent stroke: remains in PT Feels she is getting garound, feeling mostly back to normal self Has made dinner twice at home past couple of weeks  Colitis: symptoms have resolved  Pneumonia: no further symptoms, completed course of treatment  Insomnia: has not tried melatonin  Afib: not on anticoagulation Does not want to be on anticoagulation  She is taking ASA daily Concern for bleed from AVM in face  Relevant past medical, surgical, family and social history reviewed. Allergies and medications reviewed and updated. History  Smoking Status  . Former Smoker  . Packs/day: 0.25  . Years: 10.00  . Types: Cigarettes  . Quit date: 07/31/1975  Smokeless Tobacco  . Never Used   ROS: Per HPI   Objective:    BP 134/82   Pulse 89   Temp (!) 96.7 F (35.9 C) (Oral)   Ht 5' 4" (1.626 m)   Wt 223 lb 3.2 oz (101.2 kg)   BMI 38.31 kg/m   Wt Readings from Last 3 Encounters:  08/02/16 223 lb 3.2 oz (101.2 kg)  07/13/16 220 lb (99.8 kg)  06/26/16 220 lb (99.8 kg)    Gen: NAD, alert, cooperative with exam, NCAT, in wheechair EYES: EOMI, no conjunctival injection, or no icterus CV: NRRR, normal S1/S2, no murmur Resp: CTABL, no wheezes, normal WOB Abd: +BS, soft, NTND. no guarding or organomegaly Ext: No edema, warm Neuro: Alert and oriented, can lift legs b/l from knee against gravity. Sensation intact b/l UE and LE, coordination grossly normal, face symmetric MSK: normal muscle bulk  Assessment & Plan:  [name] was  seen today ffor discharge f/u and f/u mulitple med problems.   Diagnoses and all orders for this visit:  Late effect of cerebrovascular accident (CVA) Minimal effects now  Afib Adam[place] about not wanting to be on anticoagulation  Liver masses Suspicious for mets Recent Colonoscopy for chronic diarrhea in 02/2016 Will refer for biopsy -     BMP8+EGFR -     Ambulatory referral to Interventional Radiology  Insomnia, unspecified type Continue to minimize xanax use -     Melatonin 3 MG TBDP; Take 3-6 mg by mouth at bedtime as needed.  Anemia, unspecified type -     CBC with Differential/Platelet  Follow up plan: Return in about 2 months (around 09/30/2016). Assunta Found, MD [place]

## 2016-08-03 ENCOUNTER — Other Ambulatory Visit: Payer: Self-pay

## 2016-08-03 DIAGNOSIS — I69354 Hemiplegia and hemiparesis following cerebral infarction affecting left non-dominant side: Secondary | ICD-10-CM | POA: Diagnosis not present

## 2016-08-03 DIAGNOSIS — I69328 Other speech and language deficits following cerebral infarction: Secondary | ICD-10-CM | POA: Diagnosis not present

## 2016-08-03 DIAGNOSIS — R270 Ataxia, unspecified: Secondary | ICD-10-CM | POA: Diagnosis not present

## 2016-08-03 DIAGNOSIS — G319 Degenerative disease of nervous system, unspecified: Secondary | ICD-10-CM | POA: Diagnosis not present

## 2016-08-03 DIAGNOSIS — I480 Paroxysmal atrial fibrillation: Secondary | ICD-10-CM | POA: Diagnosis not present

## 2016-08-03 DIAGNOSIS — K529 Noninfective gastroenteritis and colitis, unspecified: Secondary | ICD-10-CM | POA: Diagnosis not present

## 2016-08-03 DIAGNOSIS — R160 Hepatomegaly, not elsewhere classified: Secondary | ICD-10-CM

## 2016-08-03 LAB — CBC WITH DIFFERENTIAL/PLATELET
BASOS ABS: 0 10*3/uL (ref 0.0–0.2)
Basos: 1 %
EOS (ABSOLUTE): 0.2 10*3/uL (ref 0.0–0.4)
Eos: 3 %
Hematocrit: 42.2 % (ref 34.0–46.6)
Hemoglobin: 13.6 g/dL (ref 11.1–15.9)
Immature Grans (Abs): 0 10*3/uL (ref 0.0–0.1)
Immature Granulocytes: 0 %
LYMPHS ABS: 2.2 10*3/uL (ref 0.7–3.1)
Lymphs: 33 %
MCH: 29.6 pg (ref 26.6–33.0)
MCHC: 32.2 g/dL (ref 31.5–35.7)
MCV: 92 fL (ref 79–97)
Monocytes Absolute: 0.9 10*3/uL (ref 0.1–0.9)
Monocytes: 14 %
NEUTROS ABS: 3.1 10*3/uL (ref 1.4–7.0)
Neutrophils: 49 %
PLATELETS: 334 10*3/uL (ref 150–379)
RBC: 4.59 x10E6/uL (ref 3.77–5.28)
RDW: 14.6 % (ref 12.3–15.4)
WBC: 6.5 10*3/uL (ref 3.4–10.8)

## 2016-08-03 LAB — BMP8+EGFR
BUN / CREAT RATIO: 13 (ref 12–28)
BUN: 11 mg/dL (ref 8–27)
CALCIUM: 10.1 mg/dL (ref 8.7–10.3)
CHLORIDE: 96 mmol/L (ref 96–106)
CO2: 25 mmol/L (ref 18–29)
Creatinine, Ser: 0.84 mg/dL (ref 0.57–1.00)
GFR calc non Af Amer: 66 mL/min/{1.73_m2} (ref >59)
GFR, EST AFRICAN AMERICAN: 76 mL/min/{1.73_m2} (ref >59)
Glucose: 103 mg/dL — ABNORMAL HIGH (ref 65–99)
POTASSIUM: 4.6 mmol/L (ref 3.5–5.2)
Sodium: 137 mmol/L (ref 134–144)

## 2016-08-07 DIAGNOSIS — K529 Noninfective gastroenteritis and colitis, unspecified: Secondary | ICD-10-CM | POA: Diagnosis not present

## 2016-08-07 DIAGNOSIS — I480 Paroxysmal atrial fibrillation: Secondary | ICD-10-CM | POA: Diagnosis not present

## 2016-08-07 DIAGNOSIS — G319 Degenerative disease of nervous system, unspecified: Secondary | ICD-10-CM | POA: Diagnosis not present

## 2016-08-07 DIAGNOSIS — I69354 Hemiplegia and hemiparesis following cerebral infarction affecting left non-dominant side: Secondary | ICD-10-CM | POA: Diagnosis not present

## 2016-08-07 DIAGNOSIS — R270 Ataxia, unspecified: Secondary | ICD-10-CM | POA: Diagnosis not present

## 2016-08-07 DIAGNOSIS — I69328 Other speech and language deficits following cerebral infarction: Secondary | ICD-10-CM | POA: Diagnosis not present

## 2016-08-08 LAB — CUP PACEART REMOTE DEVICE CHECK
Implantable Pulse Generator Implant Date: 20160609
MDC IDC SESS DTM: 20171201163824

## 2016-08-09 DIAGNOSIS — I480 Paroxysmal atrial fibrillation: Secondary | ICD-10-CM | POA: Diagnosis not present

## 2016-08-09 DIAGNOSIS — R270 Ataxia, unspecified: Secondary | ICD-10-CM | POA: Diagnosis not present

## 2016-08-09 DIAGNOSIS — G319 Degenerative disease of nervous system, unspecified: Secondary | ICD-10-CM | POA: Diagnosis not present

## 2016-08-09 DIAGNOSIS — I69328 Other speech and language deficits following cerebral infarction: Secondary | ICD-10-CM | POA: Diagnosis not present

## 2016-08-09 DIAGNOSIS — I69354 Hemiplegia and hemiparesis following cerebral infarction affecting left non-dominant side: Secondary | ICD-10-CM | POA: Diagnosis not present

## 2016-08-09 DIAGNOSIS — K529 Noninfective gastroenteritis and colitis, unspecified: Secondary | ICD-10-CM | POA: Diagnosis not present

## 2016-08-14 DIAGNOSIS — G319 Degenerative disease of nervous system, unspecified: Secondary | ICD-10-CM | POA: Diagnosis not present

## 2016-08-14 DIAGNOSIS — I480 Paroxysmal atrial fibrillation: Secondary | ICD-10-CM | POA: Diagnosis not present

## 2016-08-14 DIAGNOSIS — R270 Ataxia, unspecified: Secondary | ICD-10-CM | POA: Diagnosis not present

## 2016-08-14 DIAGNOSIS — K529 Noninfective gastroenteritis and colitis, unspecified: Secondary | ICD-10-CM | POA: Diagnosis not present

## 2016-08-14 DIAGNOSIS — I69354 Hemiplegia and hemiparesis following cerebral infarction affecting left non-dominant side: Secondary | ICD-10-CM | POA: Diagnosis not present

## 2016-08-14 DIAGNOSIS — I69328 Other speech and language deficits following cerebral infarction: Secondary | ICD-10-CM | POA: Diagnosis not present

## 2016-08-16 ENCOUNTER — Ambulatory Visit (HOSPITAL_COMMUNITY): Payer: Medicare Other

## 2016-08-17 ENCOUNTER — Other Ambulatory Visit: Payer: Self-pay | Admitting: Radiology

## 2016-08-20 ENCOUNTER — Other Ambulatory Visit: Payer: Self-pay | Admitting: Pediatrics

## 2016-08-20 ENCOUNTER — Ambulatory Visit (HOSPITAL_COMMUNITY)
Admission: RE | Admit: 2016-08-20 | Discharge: 2016-08-20 | Disposition: A | Discharge status: Home / Self Care | Payer: Medicare Other | Source: Ambulatory Visit | Attending: Pediatrics | Admitting: Pediatrics

## 2016-08-20 ENCOUNTER — Other Ambulatory Visit: Payer: Self-pay | Admitting: Internal Medicine

## 2016-08-20 DIAGNOSIS — F419 Anxiety disorder, unspecified: Secondary | ICD-10-CM | POA: Diagnosis not present

## 2016-08-20 DIAGNOSIS — F329 Major depressive disorder, single episode, unspecified: Secondary | ICD-10-CM | POA: Insufficient documentation

## 2016-08-20 DIAGNOSIS — C7B8 Other secondary neuroendocrine tumors: Secondary | ICD-10-CM | POA: Diagnosis not present

## 2016-08-20 DIAGNOSIS — Z87891 Personal history of nicotine dependence: Secondary | ICD-10-CM | POA: Diagnosis not present

## 2016-08-20 DIAGNOSIS — K769 Liver disease, unspecified: Secondary | ICD-10-CM | POA: Diagnosis not present

## 2016-08-20 DIAGNOSIS — E039 Hypothyroidism, unspecified: Secondary | ICD-10-CM | POA: Insufficient documentation

## 2016-08-20 DIAGNOSIS — I959 Hypotension, unspecified: Secondary | ICD-10-CM | POA: Diagnosis not present

## 2016-08-20 DIAGNOSIS — R001 Bradycardia, unspecified: Secondary | ICD-10-CM | POA: Insufficient documentation

## 2016-08-20 DIAGNOSIS — M1388 Other specified arthritis, other site: Secondary | ICD-10-CM | POA: Insufficient documentation

## 2016-08-20 DIAGNOSIS — G40909 Epilepsy, unspecified, not intractable, without status epilepticus: Secondary | ICD-10-CM | POA: Insufficient documentation

## 2016-08-20 DIAGNOSIS — K589 Irritable bowel syndrome without diarrhea: Secondary | ICD-10-CM | POA: Diagnosis not present

## 2016-08-20 DIAGNOSIS — C7A8 Other malignant neuroendocrine tumors: Secondary | ICD-10-CM | POA: Insufficient documentation

## 2016-08-20 DIAGNOSIS — K219 Gastro-esophageal reflux disease without esophagitis: Secondary | ICD-10-CM | POA: Diagnosis not present

## 2016-08-20 DIAGNOSIS — I10 Essential (primary) hypertension: Secondary | ICD-10-CM | POA: Diagnosis not present

## 2016-08-20 DIAGNOSIS — R160 Hepatomegaly, not elsewhere classified: Secondary | ICD-10-CM | POA: Insufficient documentation

## 2016-08-20 DIAGNOSIS — E669 Obesity, unspecified: Secondary | ICD-10-CM | POA: Diagnosis not present

## 2016-08-20 DIAGNOSIS — K7689 Other specified diseases of liver: Secondary | ICD-10-CM | POA: Diagnosis not present

## 2016-08-20 DIAGNOSIS — K529 Noninfective gastroenteritis and colitis, unspecified: Secondary | ICD-10-CM | POA: Insufficient documentation

## 2016-08-20 DIAGNOSIS — I480 Paroxysmal atrial fibrillation: Secondary | ICD-10-CM | POA: Insufficient documentation

## 2016-08-20 DIAGNOSIS — Z8673 Personal history of transient ischemic attack (TIA), and cerebral infarction without residual deficits: Secondary | ICD-10-CM | POA: Diagnosis not present

## 2016-08-20 DIAGNOSIS — Z87440 Personal history of urinary (tract) infections: Secondary | ICD-10-CM | POA: Diagnosis not present

## 2016-08-20 DIAGNOSIS — E7800 Pure hypercholesterolemia, unspecified: Secondary | ICD-10-CM | POA: Insufficient documentation

## 2016-08-20 DIAGNOSIS — J9601 Acute respiratory failure with hypoxia: Secondary | ICD-10-CM | POA: Insufficient documentation

## 2016-08-20 DIAGNOSIS — Z7982 Long term (current) use of aspirin: Secondary | ICD-10-CM | POA: Diagnosis not present

## 2016-08-20 LAB — CBC
HEMATOCRIT: 41.9 % (ref 36.0–46.0)
Hemoglobin: 13.4 g/dL (ref 12.0–15.0)
MCH: 29.6 pg (ref 26.0–34.0)
MCHC: 32 g/dL (ref 30.0–36.0)
MCV: 92.7 fL (ref 78.0–100.0)
Platelets: 207 10*3/uL (ref 150–400)
RBC: 4.52 MIL/uL (ref 3.87–5.11)
RDW: 14.2 % (ref 11.5–15.5)
WBC: 5.2 10*3/uL (ref 4.0–10.5)

## 2016-08-20 LAB — PROTIME-INR
INR: 1.07
Prothrombin Time: 13.9 seconds (ref 11.4–15.2)

## 2016-08-20 LAB — APTT: APTT: 27 s (ref 24–36)

## 2016-08-20 MED ORDER — LIDOCAINE HCL 1 % IJ SOLN
INTRAMUSCULAR | Status: AC
  Filled 2016-08-20: qty 20

## 2016-08-20 MED ORDER — SODIUM CHLORIDE 0.9 % IV SOLN
INTRAVENOUS | Status: DC
Start: 2016-08-20 — End: 2016-08-21

## 2016-08-20 MED ORDER — FENTANYL CITRATE (PF) 100 MCG/2ML IJ SOLN
INTRAMUSCULAR | Status: AC | PRN
  Administered 2016-08-20: 50 ug via INTRAVENOUS

## 2016-08-20 MED ORDER — MIDAZOLAM HCL 2 MG/2ML IJ SOLN
INTRAMUSCULAR | Status: AC
  Filled 2016-08-20: qty 2

## 2016-08-20 MED ORDER — FENTANYL CITRATE (PF) 100 MCG/2ML IJ SOLN
INTRAMUSCULAR | Status: AC
  Filled 2016-08-20: qty 2

## 2016-08-20 MED ORDER — GELATIN ABSORBABLE 12-7 MM EX MISC
CUTANEOUS | Status: AC
  Filled 2016-08-20: qty 1

## 2016-08-20 MED ORDER — MIDAZOLAM HCL 2 MG/2ML IJ SOLN
INTRAMUSCULAR | Status: AC | PRN
  Administered 2016-08-20 (×2): 1 mg via INTRAVENOUS

## 2016-08-20 NOTE — Procedures (Signed)
Interventional Radiology Procedure Note  Procedure: US guided biopsy of lesion in left hepatic lobe. Lesions difficult to see sonographically.   Complications: None  Estimated Blood Loss: 0  Recommendations: - Bedrest x 3 hrs - Path pending - DC home  Signed,  Criselda Peaches, MD

## 2016-08-20 NOTE — Progress Notes (Signed)
Report received from [facility]

## 2016-08-20 NOTE — Sedation Documentation (Signed)
Bandaid to upper abd, C & D

## 2016-08-20 NOTE — Discharge Instructions (Signed)
°Liver Biopsy, Care After °Introduction °Refer to this sheet in the next few weeks. These instructions provide you with information on caring for yourself after your procedure. Your health care provider may also give you more specific instructions. Your treatment has been planned according to current medical practices, but problems sometimes occur. Call your health care provider if you have any problems or questions after your procedure. °What can I expect after the procedure? °After your procedure, it is typical to have the following: °· A small amount of discomfort in the area where the biopsy was done and in the right shoulder or shoulder blade. °· A small amount of bruising around the area where the biopsy was done and on the skin over the liver. °· Sleepiness and fatigue for the rest of the day. °Follow these instructions at home: °· Rest at home for 1-2 days or as directed by your health care provider. °· Have a friend or family member stay with you for at least 24 hours. °· Because of the medicines used during the procedure, you should not do the following things in the first 24 hours: °¨ Drive. °¨ Use machinery. °¨ Be responsible for the care of other people. °¨ Sign legal documents. °¨ Take a bath or shower. °· There are many different ways to close and cover an incision, including stitches, skin glue, and adhesive strips. Follow your health care provider's instructions on: °¨ Incision care. °¨ Bandage (dressing) changes and removal. °¨ Incision closure removal. °· Do not drink alcohol in the first week. °· Do not lift more than 5 pounds or play contact sports for 2 weeks after this test. °· Take medicines only as directed by your health care provider. Do not take medicine containing aspirin or non-steroidal anti-inflammatory medicines such as ibuprofen for 1 week after this test. °· It is your responsibility to get your test results. °Contact a health care provider if: °· You have increased bleeding from  an incision that results in more than a small spot of blood. °· You have redness, swelling, or increasing pain in any incisions. °· You notice a discharge or a bad smell coming from any of your incisions. °· You have a fever or chills. °Get help right away if: °· You develop swelling, bloating, or pain in your abdomen. °· You become dizzy or faint. °· You develop a rash. °· You are nauseous or vomit. °· You have difficulty breathing, feel short of breath, or feel faint. °· You develop chest pain. °· You have problems with your speech or vision. °· You have trouble balancing or moving your arms or legs. °This information is not intended to replace advice given to you by your health care provider. Make sure you discuss any questions you have with your health care provider. °Document Released: 02/02/2005 Document Revised: 12/22/2015 Document Reviewed: 09/11/2013 °© 2017 Elsevier °Moderate Conscious Sedation, Adult, Care After °These instructions provide you with information about caring for yourself after your procedure. Your health care provider may also give you more specific instructions. Your treatment has been planned according to current medical practices, but problems sometimes occur. Call your health care provider if you have any problems or questions after your procedure. °What can I expect after the procedure? °After your procedure, it is common: °· To feel sleepy for several hours. °· To feel clumsy and have poor balance for several hours. °· To have poor judgment for several hours. °· To vomit if you eat too soon. °Follow these instructions   at home: °For at least 24 hours after the procedure:  °· Do not: °¨ Participate in activities where you could fall or become injured. °¨ Drive. °¨ Use heavy machinery. °¨ Drink alcohol. °¨ Take sleeping pills or medicines that cause drowsiness. °¨ Make important decisions or sign legal documents. °¨ Take care of children on your own. °· Rest. °Eating and  drinking °· Follow the diet recommended by your health care provider. °· If you vomit: °¨ Drink water, juice, or soup when you can drink without vomiting. °¨ Make sure you have little or no nausea before eating solid foods. °General instructions °· Have a responsible adult stay with you until you are awake and alert. °· Take over-the-counter and prescription medicines only as told by your health care provider. °· If you smoke, do not smoke without supervision. °· Keep all follow-up visits as told by your health care provider. This is important. °Contact a health care provider if: °· You keep feeling nauseous or you keep vomiting. °· You feel light-headed. °· You develop a rash. °· You have a fever. °Get help right away if: °· You have trouble breathing. °This information is not intended to replace advice given to you by your health care provider. Make sure you discuss any questions you have with your health care provider. °Document Released: 05/06/2013 Document Revised: 12/19/2015 Document Reviewed: 11/05/2015 °Elsevier Interactive Patient Education © 2017 [employer] ° °

## 2016-08-20 NOTE — Consult Note (Signed)
Chief Complaint: Patient was seen in consultation today for image guided liver biopsy.  Referring Physician(s): [name],Carol L  Supervising Physician: Jacqulynn Cadet  Patient Status: [facility] - Out-pt  History of Present Illness: [name] is a 83 y.o. female who is new to the IR service referred for image guided liver lesion biopsy. Patient had incidental finding of liver lesion on CT during hospitalization for acute colitis on 07/13/16. She is no pain currently but states she is short of breath at baseline. Denies chest pain, fevers, chills, syncope, or headaches.   Past Medical History:  Diagnosis Date  . Acute respiratory failure with hypoxia ([place]) 10/23/2015  . Allergic rhinitis    PT. DENIES  . Anxiety   . Arthritis    NECK  . Ataxia   . Bradycardia    primarily nocturnal  . Burning tongue syndrome 25 years  . Cataract   . Cerebellar degeneration   . Chronic pericarditis   . Chronic urinary tract infection   . Complication of anesthesia    low o2 sats, coded 30 years ago  . CVA (cerebral infarction) 05/2003  . Depression   . Gait disorder   . Gastric polyps   . GERD (gastroesophageal reflux disease)   . High cholesterol   . Hyperlipidemia   . Hypertension   . Hypertension   . Hypotension   . Hypothyroidism   . IBS (irritable bowel syndrome)   . Obesity   . Paroxysmal atrial fibrillation ([name])    chads2vasc score is 6,  she is felt to be a poor candidate for anticoagulation  . Personal history of arterial venous malformation (AVM)    right side of face  . Seizure disorder ([name])   . Seizures ([place]) 2003   " smelling"- Gabapentin "no problem"  . Shortness of breath dyspnea    with exertion  . Sternum fx 10/27/2013  . Stroke [facility]) 5 years ago   Right side of face weak, slurred speach-   . Thyroid disease   . TIA (transient ischemic attack)   . UTI (lower urinary tract infection) 03/27/2016   "frequently"    Past Surgical History:  Procedure  Laterality Date  . APPENDECTOMY  83 years old  . COLONOSCOPY  2006, 2009  . COLONOSCOPY WITH PROPOFOL N/A 03/28/2016   Procedure: COLONOSCOPY WITH PROPOFOL;  Surgeon: Gatha Mayer, MD;  Location: [place];  Service: Endoscopy;  Laterality: N/A;  . cyst removed  35 years ago  . EP IMPLANTABLE DEVICE N/A 01/06/2015   Procedure: Loop Recorder Insertion;  Surgeon: Thompson Grayer, MD;  Location: [place] CV LAB;  Service: Cardiovascular;  Laterality: N/A;  . EYE SURGERY Right    Cataract  . KNEE ARTHROSCOPY Right 11/14/2006  . KNEE ARTHROSCOPY Bilateral 5 and 6 years ago  . KNEE ARTHROSCOPY WITH LATERAL MENISECTOMY  07/03/2012   Procedure: KNEE ARTHROSCOPY WITH LATERAL MENISECTOMY;  Surgeon: Magnus Sinning, MD;  Location: [facility];  Service: Orthopedics;  Laterality: Left;  with Partial Lateral Menisectomy and Medial Menisectomy. Shaving of medial and lateral femoral condyles. Shaving of patella. Removal of a loose body  . tibial and fibular internal fixation Left   . TOTAL ABDOMINAL HYSTERECTOMY  83 years old  . UPPER GASTROINTESTINAL ENDOSCOPY  2009, 2013    Allergies: Lisinopril  Medications: Prior to Admission medications   Medication Sig Start Date End Date Taking? Authorizing Provider  amitriptyline (ELAVIL) 25 MG tablet Take 1 tablet (25 mg total) by mouth at  bedtime. 07/18/16  Yes Belkys A Regalado, MD  Artificial Tear Ointment (DRY EYES OP) Place 1 drop into both eyes daily.   Yes Historical Provider, MD  escitalopram (LEXAPRO) 20 MG tablet Take 1 tablet (20 mg total) by mouth at bedtime. 09/30/15  Yes Sharion Balloon, FNP  fluticasone (FLONASE) 50 MCG/ACT nasal spray Place 2 sprays into both nostrils at bedtime.   Yes Historical Provider, MD  gabapentin (NEURONTIN) 300 MG capsule Take 1 capsule (300 mg total) by mouth 3 (three) times daily. Patient taking differently: Take 300-600 mg by mouth See admin instructions. Take 600 mg by mouth in the morning and in the evening and take  300 mg by mouth in the afternoon. 07/18/16  Yes Belkys A Regalado, MD  levothyroxine (SYNTHROID, LEVOTHROID) 75 MCG tablet TAKE 1 TABLET DAILY BEFORE BREAKFAST 06/27/16  Yes Sharion Balloon, FNP  losartan (COZAAR) 50 MG tablet TAKE 1 TABLET DAILY 07/17/16  Yes Sharion Balloon, FNP  lovastatin (MEVACOR) 40 MG tablet Take 1 tablet (40 mg total) by mouth at bedtime. 09/30/15  Yes Sharion Balloon, FNP  Melatonin 3 MG TBDP Take 3-6 mg by mouth at bedtime as needed. Patient taking differently: Take 3-6 mg by mouth at bedtime as needed (for sleep).  08/02/16  Yes Eustaquio Maize, MD  omeprazole (PRILOSEC) 40 MG capsule Take 1 capsule (40 mg total) by mouth daily. 09/30/15  Yes Sharion Balloon, FNP  Vitamin D, Ergocalciferol, (DRISDOL) 50000 units CAPS capsule Take 1 capsule (50,000 Units total) by mouth every 7 (seven) days. Patient taking differently: Take 50,000 Units by mouth every Sunday.  07/18/16  Yes Belkys A Regalado, MD  ALPRAZolam (XANAX) 0.25 MG tablet Take 1 tablet (0.25 mg total) by mouth at bedtime as needed (or insomnia). 06/06/16   Eustaquio Maize, MD  amLODipine (NORVASC) 5 MG tablet TAKE 1 TABLET DAILY Patient not taking: Reported on 08/20/2016 07/09/16   Sharion Balloon, FNP  aspirin EC 81 MG EC tablet Take 2 tablets (162 mg total) by mouth daily. Patient not taking: Reported on 08/20/2016 07/19/16   Belkys A Regalado, MD  furosemide (LASIX) 40 MG tablet Take 40 mg by mouth daily as needed for fluid.    Historical Provider, MD  guaiFENesin (MUCINEX) 600 MG 12 hr tablet Take 1 tablet (600 mg total) by mouth 2 (two) times daily. Patient not taking: Reported on 08/20/2016 07/18/16   Belkys A Regalado, MD  nystatin (MYCOSTATIN) 100000 UNIT/ML suspension Take 5 mLs (500,000 Units total) by mouth 4 (four) times daily. Patient not taking: Reported on 08/20/2016 07/31/16   Mary-Margaret Hassell Done, FNP     Family History  Problem Relation Age of Onset  . Heart attack Father 79    fatal  . Coronary artery  disease Brother   . Diabetes Brother   . Prostate cancer Brother   . Prostate cancer Son     Social History   Social History  . Marital status: Married    Spouse name: N/A  . Number of children: N/A  . Years of education: N/A   Social History Main Topics  . Smoking status: Former Smoker    Packs/day: 0.25    Years: 10.00    Types: Cigarettes    Quit date: 07/31/1975  . Smokeless tobacco: Never Used  . Alcohol use No     Comment: Rare- maybe a drink ever 2 years  . Drug use: No  . Sexual activity: Not on file  Other Topics Concern  . Not on file   Social History Narrative   Lives with husband, does have stairs, does not use them. Pt completed 10th grade.     Review of Systems See HPI above. Vital Signs: BP (!) 127/58   Pulse 78   Temp 98 F (36.7 C) (Oral)   Resp 16   Ht '5\' 4"'$  (1.626 m)   Wt 222 lb (100.7 kg)   SpO2 99%   BMI 38.11 kg/m   Physical Exam Awake and alert and in no acute distress. Heart is RRR. Lungs are clear to auscultation bilaterally. Abdomen is soft, nontender, nondistended without organomegaly. No edema on extremities.   Imaging: No results found.  Labs:  CBC:  Recent Labs  10/24/15 0613  07/13/16 1214 07/14/16 0600 07/16/16 0752 08/02/16 1534  WBC 9.0  < > 13.2* 15.9* 11.9* 6.5  HGB 12.2  --  13.4 12.2 11.7*  --   HCT 37.5  < > 42.0 37.3 35.6* 42.2  PLT 200  < > 202 210 184 334  < > = values in this interval not displayed.  COAGS: No results for input(s): INR, APTT in the last 8760 hours.  BMP:  Recent Labs  07/13/16 1214 07/14/16 0600 07/16/16 0752 08/02/16 1534  NA 135 135 135 137  K 4.1 3.7 3.6 4.6  CL 102 102 105 96  CO2 '25 25 24 25  '$ GLUCOSE 140* 137* 148* 103*  BUN '13 16 13 11  '$ CALCIUM 9.8 9.0 8.8* 10.1  CREATININE 1.04* 0.98 0.84 0.84  GFRNONAA 49* 53* >60 66  GFRAA 57* >60 >60 76    LIVER FUNCTION TESTS:  Recent Labs  09/30/15 1540 03/01/16 1716 04/12/16 1455 07/14/16 0600  BILITOT 0.3 0.4  0.4 0.5  AST '19 15 16 '$ 47*  ALT '13 10 9 18  '$ ALKPHOS 78 74 64 67  PROT 7.1 7.1 6.4 6.0*  ALBUMIN 4.4 4.4 3.9 3.2*    TUMOR MARKERS: No results for input(s): AFPTM, CEA, CA199, CHROMGRNA in the last 8760 hours.  Assessment and Plan:  Liver lesions apparent on CT scan on 07/13/16. Patient is stable and labs pending. Plans are to undergo image guided liver lesion biopsy later today. Dr. Laurence Ferrari has reviewed the images and approves of the procedure. Risks and benefits discussed with the patient/husband including, but not limited to bleeding, infection, damage to adjacent structures or low yield requiring additional tests. All of the patient's questions were answered, patient is agreeable to proceed. Consent signed and in chart.    Thank you for this interesting consult.  I greatly enjoyed meeting ARIONA DESCHENE and look forward to participating in their care.  A copy of this report was sent to the requesting provider on this date.  Electronically Signed: D. Rowe Robert 08/20/2016, 12:37 PM   I spent a total of  30 Minutes   in face to face in clinical consultation, greater than 50% of which was counseling/coordinating care for image guided liver lesion  biopsy.

## 2016-08-22 ENCOUNTER — Other Ambulatory Visit: Payer: Self-pay | Admitting: Family

## 2016-08-22 DIAGNOSIS — K529 Noninfective gastroenteritis and colitis, unspecified: Secondary | ICD-10-CM | POA: Diagnosis not present

## 2016-08-22 DIAGNOSIS — R270 Ataxia, unspecified: Secondary | ICD-10-CM | POA: Diagnosis not present

## 2016-08-22 DIAGNOSIS — I69328 Other speech and language deficits following cerebral infarction: Secondary | ICD-10-CM | POA: Diagnosis not present

## 2016-08-22 DIAGNOSIS — I480 Paroxysmal atrial fibrillation: Secondary | ICD-10-CM | POA: Diagnosis not present

## 2016-08-22 DIAGNOSIS — I69354 Hemiplegia and hemiparesis following cerebral infarction affecting left non-dominant side: Secondary | ICD-10-CM | POA: Diagnosis not present

## 2016-08-22 DIAGNOSIS — G319 Degenerative disease of nervous system, unspecified: Secondary | ICD-10-CM | POA: Diagnosis not present

## 2016-08-23 ENCOUNTER — Encounter: Payer: Self-pay | Admitting: Pediatrics

## 2016-08-23 ENCOUNTER — Ambulatory Visit (INDEPENDENT_AMBULATORY_CARE_PROVIDER_SITE_OTHER): Payer: Medicare Other | Admitting: Pediatrics

## 2016-08-23 VITALS — BP 127/67 | HR 85 | Temp 98.2°F | Ht 64.0 in | Wt 223.6 lb

## 2016-08-23 DIAGNOSIS — C787 Secondary malignant neoplasm of liver and intrahepatic bile duct: Secondary | ICD-10-CM | POA: Diagnosis not present

## 2016-08-23 DIAGNOSIS — R569 Unspecified convulsions: Secondary | ICD-10-CM | POA: Diagnosis not present

## 2016-08-23 DIAGNOSIS — C7A8 Other malignant neuroendocrine tumors: Secondary | ICD-10-CM

## 2016-08-23 DIAGNOSIS — G629 Polyneuropathy, unspecified: Secondary | ICD-10-CM

## 2016-08-23 DIAGNOSIS — E559 Vitamin D deficiency, unspecified: Secondary | ICD-10-CM | POA: Diagnosis not present

## 2016-08-23 HISTORY — DX: Other malignant neuroendocrine tumors: C7A.8

## 2016-08-23 MED ORDER — GABAPENTIN 300 MG PO CAPS: 300.0000 mg | ORAL_CAPSULE | Freq: Four times a day (QID) | ORAL | 1 refills | Status: DC

## 2016-08-23 MED ORDER — GABAPENTIN 300 MG PO CAPS: 300.0000 mg | ORAL_CAPSULE | ORAL | 1 refills | Status: DC

## 2016-08-23 MED ORDER — VITAMIN D (ERGOCALCIFEROL) 1.25 MG (50000 UNIT) PO CAPS: 50000.0000 [IU] | ORAL_CAPSULE | ORAL | 1 refills | Status: DC

## 2016-08-23 NOTE — Patient Instructions (Signed)
We will call you with appointment with [facility] oncology

## 2016-08-23 NOTE — Progress Notes (Signed)
  Subjective:   Patient ID: [name], female    DOB: Feb 18, 1934, 83 y.o.   MRN: [id number] CC: Discuss Biopsy  HPI: [name] is a 83 y.o. female presenting for Discuss Biopsy  Recent liver biopsy positive for metastatic neuroendocrine tumor, with TTF-1 positivity primary lung source is favored. Recent CT scan chest with RML nodule possibly slightly increased in size. Colonoscopy in 02/2016 for diarrhea suspected to be from IBS was normal, no biopsies EGD in 2013 with polyps in stomach, normal biopsy  Feeling well overall today Continues to work on gaining strength back from recent CVA Has had decreased taste for years and decreased appetite she thinks as a result for same time. No recent changes in weight or appetite.  Gabapentin recently increased for increased smell seizures by neurology  No abd pain, normal stooling, no night sweats  Here today with her husband for discussion of positive biopsy results  Relevant past medical, surgical, family and social history reviewed. Allergies and medications reviewed and updated. History  Smoking Status  . Former Smoker  . Packs/day: 0.25  . Years: 10.00  . Types: Cigarettes  . Quit date: 07/31/1975  Smokeless Tobacco  . Never Used   ROS: Per HPI   Objective:    BP 127/67   Pulse 85   Temp 98.2 F (36.8 C) (Oral)   Ht '5\' 4"'$  (1.626 m)   Wt 223 lb 9.6 oz (101.4 kg)   BMI 38.38 kg/m   Wt Readings from Last 3 Encounters:  08/23/16 223 lb 9.6 oz (101.4 kg)  08/20/16 222 lb (100.7 kg)  08/02/16 223 lb 3.2 oz (101.2 kg)   Gen: NAD, alert, cooperative with exam, NCAT EYES: no conjunctival injection, or no icterus ENT:  OP without erythema LYMPH: no cervical LAD CV: NRRR, normal S1/S2, no murmur, distal pulses 2+ b/l Resp: CTABL, no wheezes, normal WOB Abd: +BS, soft, NT Ext: No edema, warm Neuro: Alert and oriented  Assessment & Plan:  [name] was seen today for discuss biopsy.  Diagnoses and all orders for this  visit:  Neuroendocrine cancer ([place]) Positive liver met biopsy Unknown primary Refer to [facility] for oncology -     Ambulatory referral to Oncology  Liver metastases [facility]) -     Ambulatory referral to Oncology  Vitamin D deficiency Remains low, cont below -     Vitamin D, Ergocalciferol, (DRISDOL) 50000 units CAPS capsule; Take 1 capsule (50,000 Units total) by mouth every 7 (seven) days.  Seizures ([place]) Continue gabapentin, dose recently increased by neurology for recurring strong smell seizures -     gabapentin (NEURONTIN) 300 MG capsule; Take 1-2 capsules (300-600 mg total) by mouth 4 (four) times daily.   Follow up plan: As scheduled Assunta Found, MD [place]

## 2016-08-27 ENCOUNTER — Ambulatory Visit: Payer: Medicare Other | Admitting: Pediatrics

## 2016-08-27 ENCOUNTER — Telehealth: Payer: Self-pay | Admitting: Pediatrics

## 2016-08-27 NOTE — Telephone Encounter (Signed)
Patient states that starting Friday night patient states that she had a sharp pain on lower left side.  Patient states that pain has subsided over the weekend and only gets the pain when she moves a certain way.

## 2016-08-27 NOTE — Telephone Encounter (Signed)
Pt aware of recommendations. She is taking laxatives. Informed her that Oncology she be getting in touch with her in the next couple of days.

## 2016-08-27 NOTE — Telephone Encounter (Signed)
Let me know if the pain gets worse. Take laxatives if needed to make sure having daily stools. We should appt soon for oncology.

## 2016-08-28 ENCOUNTER — Telehealth: Payer: Self-pay | Admitting: Pediatrics

## 2016-08-28 ENCOUNTER — Ambulatory Visit (INDEPENDENT_AMBULATORY_CARE_PROVIDER_SITE_OTHER): Payer: Medicare Other | Admitting: *Deleted

## 2016-08-28 DIAGNOSIS — R002 Palpitations: Secondary | ICD-10-CM

## 2016-08-28 NOTE — Telephone Encounter (Signed)
x

## 2016-08-28 NOTE — Telephone Encounter (Signed)
That's fine, can we switch referral to [place]? Thank you

## 2016-08-29 ENCOUNTER — Ambulatory Visit (INDEPENDENT_AMBULATORY_CARE_PROVIDER_SITE_OTHER): Payer: Medicare Other | Admitting: Pediatrics

## 2016-08-29 ENCOUNTER — Encounter: Payer: Medicare Other | Admitting: *Deleted

## 2016-08-29 ENCOUNTER — Ambulatory Visit: Payer: Medicare Other | Admitting: Family Medicine

## 2016-08-29 DIAGNOSIS — I69354 Hemiplegia and hemiparesis following cerebral infarction affecting left non-dominant side: Secondary | ICD-10-CM

## 2016-08-29 DIAGNOSIS — R270 Ataxia, unspecified: Secondary | ICD-10-CM

## 2016-08-29 DIAGNOSIS — G319 Degenerative disease of nervous system, unspecified: Secondary | ICD-10-CM | POA: Diagnosis not present

## 2016-08-29 DIAGNOSIS — I69328 Other speech and language deficits following cerebral infarction: Secondary | ICD-10-CM | POA: Diagnosis not present

## 2016-08-29 NOTE — Progress Notes (Signed)
Carelink Summary Report / Loop Recorder 

## 2016-08-29 NOTE — Telephone Encounter (Signed)
done

## 2016-08-30 ENCOUNTER — Encounter: Payer: Self-pay | Admitting: Internal Medicine

## 2016-08-30 ENCOUNTER — Telehealth: Payer: Self-pay | Admitting: Internal Medicine

## 2016-08-30 NOTE — Telephone Encounter (Signed)
Appt has been scheduled for the pt to see Dr. [name] on 2/20 at 215pm. Pt has been made aware to arrive at 145pm. Pt agreed to the appt date and time. Letter mailed to the pt.

## 2016-08-31 DIAGNOSIS — I69354 Hemiplegia and hemiparesis following cerebral infarction affecting left non-dominant side: Secondary | ICD-10-CM | POA: Diagnosis not present

## 2016-08-31 DIAGNOSIS — I69328 Other speech and language deficits following cerebral infarction: Secondary | ICD-10-CM | POA: Diagnosis not present

## 2016-08-31 DIAGNOSIS — K529 Noninfective gastroenteritis and colitis, unspecified: Secondary | ICD-10-CM | POA: Diagnosis not present

## 2016-08-31 DIAGNOSIS — R270 Ataxia, unspecified: Secondary | ICD-10-CM | POA: Diagnosis not present

## 2016-08-31 DIAGNOSIS — G319 Degenerative disease of nervous system, unspecified: Secondary | ICD-10-CM | POA: Diagnosis not present

## 2016-08-31 DIAGNOSIS — I480 Paroxysmal atrial fibrillation: Secondary | ICD-10-CM | POA: Diagnosis not present

## 2016-09-07 DIAGNOSIS — I480 Paroxysmal atrial fibrillation: Secondary | ICD-10-CM | POA: Diagnosis not present

## 2016-09-07 DIAGNOSIS — G319 Degenerative disease of nervous system, unspecified: Secondary | ICD-10-CM | POA: Diagnosis not present

## 2016-09-07 DIAGNOSIS — K529 Noninfective gastroenteritis and colitis, unspecified: Secondary | ICD-10-CM | POA: Diagnosis not present

## 2016-09-07 DIAGNOSIS — R270 Ataxia, unspecified: Secondary | ICD-10-CM | POA: Diagnosis not present

## 2016-09-07 DIAGNOSIS — I69354 Hemiplegia and hemiparesis following cerebral infarction affecting left non-dominant side: Secondary | ICD-10-CM | POA: Diagnosis not present

## 2016-09-07 DIAGNOSIS — I69328 Other speech and language deficits following cerebral infarction: Secondary | ICD-10-CM | POA: Diagnosis not present

## 2016-09-10 DIAGNOSIS — R339 Retention of urine, unspecified: Secondary | ICD-10-CM | POA: Diagnosis not present

## 2016-09-12 LAB — CUP PACEART REMOTE DEVICE CHECK
Date Time Interrogation Session: 20171231170824
Implantable Pulse Generator Implant Date: 20160609

## 2016-09-12 NOTE — Progress Notes (Signed)
Carelink summary report received. Battery status OK. Normal device function. No new symptom episodes, tachy episodes, or brady episodes. No new AF episodes. 1 pause- ECG appears 5 sec. pause, per note in EPIC, pt asymptomatic, sleeping. Monthly summary reports and ROV/PRN

## 2016-09-18 ENCOUNTER — Telehealth: Payer: Self-pay | Admitting: Internal Medicine

## 2016-09-18 ENCOUNTER — Telehealth: Payer: Self-pay | Admitting: Medical Oncology

## 2016-09-18 ENCOUNTER — Encounter: Payer: Self-pay | Admitting: Internal Medicine

## 2016-09-18 ENCOUNTER — Ambulatory Visit (HOSPITAL_BASED_OUTPATIENT_CLINIC_OR_DEPARTMENT_OTHER): Payer: Medicare Other | Admitting: Internal Medicine

## 2016-09-18 VITALS — BP 132/57 | HR 75 | Temp 98.0°F | Resp 18 | Ht 64.0 in | Wt 223.0 lb

## 2016-09-18 DIAGNOSIS — C787 Secondary malignant neoplasm of liver and intrahepatic bile duct: Secondary | ICD-10-CM

## 2016-09-18 DIAGNOSIS — C7A8 Other malignant neuroendocrine tumors: Secondary | ICD-10-CM

## 2016-09-18 DIAGNOSIS — C7B8 Other secondary neuroendocrine tumors: Secondary | ICD-10-CM | POA: Diagnosis not present

## 2016-09-18 NOTE — Telephone Encounter (Signed)
Confirmed PET scan for march 6 arrive at 0630 to [facility] radiology . Drink plenty of water and npo after 0430 morning of scan . Can drink sips of water after 0430. Husband did teach back.

## 2016-09-18 NOTE — Telephone Encounter (Signed)
Appointments scheduled per 09/20/16 los. Patient was given a copy of the AVS report and appointment schedule per 09/18/16 los.

## 2016-09-18 NOTE — Telephone Encounter (Signed)
Spoke with [name], who states Dr. Melvyn Novas found 2 very small lung nodules a few years back that were not supposed to turn into anything. [name] states she then was dx with liver cancer, and then learned that this cancer was caused by stage 4 lung cancer. [name] was very upset and stated had Dr. Melvyn Novas biopsy these nodules this would had not happen. I apologized to [name] and [name] replied with "yeah i'm very sorry too have good day and hung up the phone" Will route to MW as  Juluis Rainier.

## 2016-09-18 NOTE — Progress Notes (Addendum)
[place] [phone]   [phone]  CONSULT NOTE  REFERRING PHYSICIAN: Mary-Margaret Hassell Done, FNP  REASON FOR CONSULTATION:  83 years old white female recently diagnosed with neuroendocrine tumor of the lung  HPI [name] is a 83 y.o. female was past medical history significant for allergic rhinitis, osteoarthritis, chronic pericarditis, stroke, depression, hypertension, dyslipidemia, hypothyroidism, atrial fibrillation as well as history of seizure. The patient was light former smoker and quit smoking 40 years ago. She was admitted to [facility] in December 2017 complaining of abdominal pain as well as a stroke. During her evaluation she had CT scan of the abdomen and pelvis performed on 07/13/2016 and it showed ill-defined liver lesions worrisome for new hepatic metastatic disease. The largest measured 2.5 cm. There was also 2.2 cm lesion. This was followed by CT scan of the chest with contrast on 07/14/2016 and it showed 5 mm nodule seen in the medial aspect of the left lower lobe. There was also 1.1 x 0.9 cm right middle lobe nodule that was seen on 05/10/2015 and now measure 1.3 x 1.0 cm. There was also to left lower lobe nodules. On 08/20/2016 the patient underwent ultrasound-guided biopsy of a left-sided hepatic lesion. The final pathology ([id number]) was consistent with metastatic neuroendocrine tumor, intermediate grade of lung primary. The patient was referred to me today for evaluation and recommendation regarding treatment of her condition. When seen today the patient is feeling fine with no specific complaints. She denied having any chest pain, shortness breath, cough or hemoptysis. She has no weight loss or night sweats. She denied having any nausea, vomiting, diarrhea or constipation. She has no fever or chills. She denied having any headache or visual changes. Family history significant for mother who died at age 10 from old  age, father died from heart attack at age 56, Brother died from heart attack at age 51, son died from prostate cancer at age 24 and Brother died from bladder cancer The patient is married and has 5 living children. She was accompanied today by her husband [name]. She is a housewife. She has remote history of light smoking and quit 40 years ago. She has no history of alcohol or drug abuse.  HPI  Past Medical History:  Diagnosis Date  . Acute respiratory failure with hypoxia ([place]) 10/23/2015  . Allergic rhinitis    PT. DENIES  . Anxiety   . Arthritis    NECK  . Ataxia   . Bradycardia    primarily nocturnal  . Burning tongue syndrome 25 years  . Cancer ([place])   . Cataract   . Cerebellar degeneration   . Chronic pericarditis   . Chronic urinary tract infection   . Complication of anesthesia    low o2 sats, coded 30 years ago  . CVA (cerebral infarction) 05/2003  . Depression   . Gait disorder   . Gastric polyps   . GERD (gastroesophageal reflux disease)   . High cholesterol   . Hyperlipidemia   . Hypertension   . Hypertension   . Hypotension   . Hypothyroidism   . IBS (irritable bowel syndrome)   . Obesity   . Paroxysmal atrial fibrillation ([name])    chads2vasc score is 6,  she is felt to be a poor candidate for anticoagulation  . Personal history of arterial venous malformation (AVM)    right side of face  . Seizure disorder ([place])   . Seizures ([place]) 2003   "  smelling"- Gabapentin "no problem"  . Shortness of breath dyspnea    with exertion  . Sternum fx 10/27/2013  . Stroke [facility]) 5 years ago   Right side of face weak, slurred speach-   . Thyroid disease   . TIA (transient ischemic attack)   . UTI (lower urinary tract infection) 03/27/2016   "frequently"    Past Surgical History:  Procedure Laterality Date  . APPENDECTOMY  83 years old  . COLONOSCOPY  2006, 2009  . COLONOSCOPY WITH PROPOFOL N/A 03/28/2016   Procedure: COLONOSCOPY WITH PROPOFOL;  Surgeon: Gatha Mayer, MD;  Location: [facility];  Service: Endoscopy;  Laterality: N/A;  . cyst removed  35 years ago  . EP IMPLANTABLE DEVICE N/A 01/06/2015   Procedure: Loop Recorder Insertion;  Surgeon: Thompson Grayer, MD;  Location: [place] [facility];  Service: Cardiovascular;  Laterality: N/A;  . EYE SURGERY Right    Cataract  . KNEE ARTHROSCOPY Right 11/14/2006  . KNEE ARTHROSCOPY Bilateral 5 and 6 years ago  . KNEE ARTHROSCOPY WITH LATERAL MENISECTOMY  07/03/2012   Procedure: KNEE ARTHROSCOPY WITH LATERAL MENISECTOMY;  Surgeon: Magnus Sinning, MD;  Location: [facility];  Service: Orthopedics;  Laterality: Left;  with Partial Lateral Menisectomy and Medial Menisectomy. Shaving of medial and lateral femoral condyles. Shaving of patella. Removal of a loose body  . tibial and fibular internal fixation Left   . TOTAL ABDOMINAL HYSTERECTOMY  83 years old  . UPPER GASTROINTESTINAL ENDOSCOPY  2009, 2013    Family History  Problem Relation Age of Onset  . Heart attack Father 49    fatal  . Coronary artery disease Brother   . Diabetes Brother   . Prostate cancer Brother   . Prostate cancer Son     Social History Social History  Substance Use Topics  . Smoking status: Former Smoker    Packs/day: 0.25    Years: 10.00    Types: Cigarettes    Quit date: 07/31/1975  . Smokeless tobacco: Never Used  . Alcohol use No     Comment: Rare- maybe a drink ever 2 years    Allergies  Allergen Reactions  . Lisinopril Cough    Current Outpatient Prescriptions  Medication Sig Dispense Refill  . amitriptyline (ELAVIL) 25 MG tablet Take 1 tablet (25 mg total) by mouth at bedtime. 30 tablet 0  . Artificial Tear Ointment (DRY EYES OP) Place 1 drop into both eyes daily.    Marland Kitchen escitalopram (LEXAPRO) 20 MG tablet Take 1 tablet (20 mg total) by mouth at bedtime. 90 tablet 2  . fluticasone (FLONASE) 50 MCG/ACT nasal spray Place 2 sprays into both nostrils at bedtime.    . gabapentin (NEURONTIN) 300 MG capsule Take  1-2 capsules (300-600 mg total) by mouth 4 (four) times daily. 450 capsule 1  . levothyroxine (SYNTHROID, LEVOTHROID) 75 MCG tablet TAKE 1 TABLET DAILY BEFORE BREAKFAST 90 tablet 0  . lisinopril (PRINIVIL,ZESTRIL) 20 MG tablet TAKE 1 TABLET AT BEDTIME 90 tablet 1  . losartan (COZAAR) 50 MG tablet TAKE 1 TABLET DAILY 90 tablet 1  . lovastatin (MEVACOR) 40 MG tablet Take 1 tablet (40 mg total) by mouth at bedtime. 90 tablet 3  . Melatonin 3 MG TBDP Take 3-6 mg by mouth at bedtime as needed. (Patient taking differently: Take 3-6 mg by mouth at bedtime as needed (for sleep). ) 60 tablet 1  . nitrofurantoin (MACRODANTIN) 50 MG capsule Take 50 mg by mouth at bedtime.    Marland Kitchen  omeprazole (PRILOSEC) 40 MG capsule Take 1 capsule (40 mg total) by mouth daily. 90 capsule 3  . Vitamin D, Ergocalciferol, (DRISDOL) 50000 units CAPS capsule Take 1 capsule (50,000 Units total) by mouth every 7 (seven) days. 12 capsule 1  . furosemide (LASIX) 40 MG tablet Take 40 mg by mouth daily as needed for fluid.     No current facility-administered medications for this visit.     Review of Systems  Constitutional: positive for fatigue Eyes: negative Ears, nose, mouth, throat, and face: negative Respiratory: negative Cardiovascular: negative Gastrointestinal: negative Genitourinary:negative Integument/breast: negative Hematologic/lymphatic: negative Musculoskeletal:positive for muscle weakness Neurological: negative Behavioral/Psych: negative Endocrine: negative Allergic/Immunologic: negative  Physical Exam Blood pressure (!) 132/57, pulse 75, temperature 98 F (36.7 C), temperature source Oral, resp. rate 18, height '5\' 4"'$  (1.626 m), weight 223 lb (101.2 kg), SpO2 97 %.  UXL:KGMWN, healthy, no distress, well nourished and well developed SKIN: skin color, texture, turgor are normal, no rashes or significant lesions HEAD: Normocephalic, No masses, lesions, tenderness or abnormalities EYES: normal, PERRLA,  Conjunctiva are pink and non-injected EARS: External ears normal, Canals clear OROPHARYNX:no exudate, no erythema and lips, buccal mucosa, and tongue normal  NECK: supple, no adenopathy, no JVD LYMPH:  no palpable lymphadenopathy, no hepatosplenomegaly BREAST:not examined LUNGS: clear to auscultation , and palpation HEART: regular rate & rhythm, no murmurs and no gallops ABDOMEN:abdomen soft, non-tender, normal bowel sounds and no masses or organomegaly BACK: Back symmetric, no curvature., No CVA tenderness EXTREMITIES:no joint deformities, effusion, or inflammation, no edema, no skin discoloration  NEURO: alert & oriented x 3 with fluent speech, no focal motor/sensory deficits  PERFORMANCE STATUS: ECOG 1  LABORATORY DATA: Lab Results  Component Value Date   WBC 5.2 08/28/2016   HGB 13.4 08-28-16   HCT 41.9 August 28, 2016   MCV 92.7 August 28, 2016   PLT 207 08-28-2016      Chemistry      Component Value Date/Time   NA 137 08/02/2016 1534   K 4.6 08/02/2016 1534   CL 96 08/02/2016 1534   CO2 25 08/02/2016 1534   BUN 11 08/02/2016 1534   CREATININE 0.84 08/02/2016 1534      Component Value Date/Time   CALCIUM 10.1 08/02/2016 1534   ALKPHOS 67 07/14/2016 0600   AST 47 (H) 07/14/2016 0600   ALT 18 07/14/2016 0600   BILITOT 0.5 07/14/2016 0600   BILITOT 0.4 04/12/2016 1455       RADIOGRAPHIC STUDIES: US Biopsy  Result Date: 28-Aug-2016 INDICATION: 83 year old female with liver lesions of uncertain etiology. She presents for ultrasound-guided core biopsy. EXAM: ULTRASOUND BIOPSY CORE LIVER MEDICATIONS: None. ANESTHESIA/SEDATION: Moderate (conscious) sedation was employed during this procedure. A total of Versed 2 mg and Fentanyl 50 mcg was administered intravenously. Moderate Sedation Time: 15 minutes. The patient's level of consciousness and vital signs were monitored continuously by radiology nursing throughout the procedure under my direct supervision. FLUOROSCOPY TIME:   Fluoroscopy Time: 0 minutes 0 seconds (0 mGy). COMPLICATIONS: None immediate. PROCEDURE: Informed written consent was obtained from the patient after a thorough discussion of the procedural risks, benefits and alternatives. All questions were addressed. Maximal Sterile Barrier Technique was utilized including caps, mask, sterile gowns, sterile gloves, sterile drape, hand hygiene and skin antiseptic. A timeout was performed prior to the initiation of the procedure. The right upper quadrant was interrogated with ultrasound. A subtle hypoechoic lesion can be identified in the left liver adjacent to the medial margin. This corresponds with the site of 1 of  the lesions seen on recent CT imaging. The right hepatic lesion could not be identified sonographically. Therefore, a suitable skin entry site was selected and marked. Following sterile prep and drape, local anesthesia was attained by infiltration with 1% lidocaine. A small dermatotomy was made. Under real-time sonographic guidance, a 17 gauge introducer needle was advanced through the liver and positioned at the margin of the liver lesion. Multiple 18 gauge core biopsies were then coaxially obtained using the bio Pince automated biopsy device. Biopsy specimens were placed in formalin and delivered to pathology for further analysis. As the introducer needle was removed, the biopsy tract was embolized with a Gel-Foam slurry. Post biopsy ultrasound imaging demonstrates no evidence of active bleeding or perihepatic hematoma. The patient tolerated the procedure well. IMPRESSION: Technically successful ultrasound-guided core biopsy of left-sided hepatic lesion. Signed, Criselda Peaches, MD [facility] Electronically Signed   By: Jacqulynn Cadet M.D.   On: 08/20/2016 15:37    ASSESSMENT: This is a very pleasant 83 years old white female with metastatic intermediate grade neuroendocrine tumor of lung primary  diagnosed in January 2018 and presented with multiple liver metastases in addition to small bilateral pulmonary nodules. She has these pulmonary nodules 4 years and she was observed by Dr. Melvyn Novas as a standard of care as there was no significant change in them.   PLAN: I had a lengthy discussion with the patient and her husband about her current disease stage, prognosis and treatment options. I recommended for the patient to complete the staging workup by ordering a Dotatate PET scan for evaluation of the neuroendocrine tumor. She had recent imaging studies of the brain that were unremarkable except for the history of stroke. I discussed with the patient and her husband her treatment options and goals of care. I explained to the patient that she has intermediate grade neuroendocrine tumor involving the liver and possibly the lung.. I discussed with the patient the option of palliative care and hospice referral versus consideration of treatment with aggressive therapy with carboplatin and etoposide intravenously versus consideration of treatment with oral Xeloda and Temodar. The patient is not interested in considering any aggressive treatment and declined the option of carboplatin and etoposide. She may consider the option of treatment with the oral Xeloda and Temodar. I would see her back for follow-up visit in 2 weeks for more detailed discussion of this treatment options after the PET scan. She was advised to call immediately if she has any concerning symptoms in the interval.  The patient voices understanding of current disease status and treatment options and is in agreement with the current care plan.  All questions were answered. The patient knows to call the clinic with any problems, questions or concerns. We can certainly see the patient much sooner if necessary.  Thank you so much for allowing me to participate in the care of [name]. I will continue to follow up the patient with you  and assist in her care.  I spent 40 minutes counseling the patient face to face. The total time spent in the appointment was 60 minutes.  Disclaimer: This note was dictated with voice recognition software. Similar sounding words can inadvertently be transcribed and may not be corrected upon review.   Kandra Graven K. September 18, 2016, 2:43 PM

## 2016-09-18 NOTE — Telephone Encounter (Signed)
I agree with Dr. Melvyn Novas. The nodules in the lung were stable and observation is the standard for this condition. They may or may not be related to what she has in the liver. I personally told the patient and her husband that there is no wrong doing here.

## 2016-09-18 NOTE — Telephone Encounter (Signed)
Records reviewed, fleischner guidelines followed as per radiology recs which lean toward being more aggressive with f/u, not less.  No def evidence of a lung primary as the nodules we identified previously have not really grown substantially and this is a neuroendocrine tumor which is not usually approached surgically anyway so I have nothing else to offer here but certainly willing to see prn.  Copy to mohammed and he can close the note

## 2016-09-20 ENCOUNTER — Other Ambulatory Visit: Payer: Self-pay | Admitting: *Deleted

## 2016-09-20 DIAGNOSIS — J3089 Other allergic rhinitis: Secondary | ICD-10-CM

## 2016-09-20 MED ORDER — FLUTICASONE PROPIONATE 50 MCG/ACT NA SUSP: 2.0000 | Freq: Every day | NASAL | 3 refills | Status: DC

## 2016-09-20 NOTE — Telephone Encounter (Signed)
Went over medication list with pt, now updated and correct. NOT TAKING amlodipine and lisinopril, removed from medicine list.

## 2016-09-20 NOTE — Telephone Encounter (Signed)
This is not on the med list  - amlodipine   I have added it as a [facility]ill. [name] pt

## 2016-09-25 ENCOUNTER — Other Ambulatory Visit: Payer: Self-pay | Admitting: Family

## 2016-09-25 DIAGNOSIS — E038 Other specified hypothyroidism: Secondary | ICD-10-CM

## 2016-09-26 LAB — CUP PACEART REMOTE DEVICE CHECK
Implantable Pulse Generator Implant Date: 20160609
MDC IDC SESS DTM: 20180130220710

## 2016-09-27 ENCOUNTER — Ambulatory Visit (INDEPENDENT_AMBULATORY_CARE_PROVIDER_SITE_OTHER): Payer: Medicare Other | Admitting: *Deleted

## 2016-09-27 DIAGNOSIS — R002 Palpitations: Secondary | ICD-10-CM

## 2016-09-27 NOTE — Progress Notes (Signed)
Carelink Summary Report / Loop Recorder 

## 2016-10-01 ENCOUNTER — Encounter: Payer: Self-pay | Admitting: Pediatrics

## 2016-10-01 ENCOUNTER — Ambulatory Visit (INDEPENDENT_AMBULATORY_CARE_PROVIDER_SITE_OTHER): Payer: Medicare Other | Admitting: Pediatrics

## 2016-10-01 VITALS — BP 135/77 | HR 77 | Temp 97.1°F | Ht 64.0 in | Wt 227.2 lb

## 2016-10-01 DIAGNOSIS — I10 Essential (primary) hypertension: Secondary | ICD-10-CM

## 2016-10-01 DIAGNOSIS — F32A Depression, unspecified: Secondary | ICD-10-CM

## 2016-10-01 DIAGNOSIS — F329 Major depressive disorder, single episode, unspecified: Secondary | ICD-10-CM

## 2016-10-01 DIAGNOSIS — C787 Secondary malignant neoplasm of liver and intrahepatic bile duct: Secondary | ICD-10-CM | POA: Diagnosis not present

## 2016-10-01 DIAGNOSIS — K146 Glossodynia: Secondary | ICD-10-CM

## 2016-10-01 DIAGNOSIS — F411 Generalized anxiety disorder: Secondary | ICD-10-CM | POA: Diagnosis not present

## 2016-10-01 MED ORDER — ESCITALOPRAM OXALATE 20 MG PO TABS: 20.0000 mg | ORAL_TABLET | Freq: Every day | ORAL | 2 refills | Status: DC

## 2016-10-01 MED ORDER — AMITRIPTYLINE HCL 25 MG PO TABS: 25.0000 mg | ORAL_TABLET | Freq: Every day | ORAL | 1 refills | Status: DC

## 2016-10-01 MED ORDER — ESCITALOPRAM OXALATE 20 MG PO TABS: 20.0000 mg | ORAL_TABLET | Freq: Every day | ORAL | 1 refills | Status: DC

## 2016-10-01 NOTE — Progress Notes (Signed)
  Subjective:   Patient ID: [name], female    DOB: 04-Jul-1934, 83 y.o.   MRN: [id number] CC: Follow-up multiple med problems HPI: [name] is a 83 y.o. female presenting for Follow-up  Depression: Mood has been good despite recent cancer diagnosis Says she doesn't think about it much unless someone else brings it up  Burning tongue: Takes amitriptyline at night, doesn't completely take it away but prevents her from thinking about it  Having leg pain at night, as soon as she stands up or changes position it goes away  PET scan tomorrow for further evaluation of cancer  No chest pain, SOB Breathing is fine Appetite has been fine No fevers Energy levels have bene good Confined to wheel chair due to cerebellar dysfunction  Relevant past medical, surgical, family and social history reviewed. Allergies and medications reviewed and updated. History  Smoking Status  . Former Smoker  . Packs/day: 0.25  . Years: 10.00  . Types: Cigarettes  . Quit date: 07/31/1975  Smokeless Tobacco  . Never Used   ROS: Per HPI   Objective:    BP 135/77   Pulse 77   Temp 97.1 F (36.2 C) (Oral)   Ht '5\' 4"'$  (1.626 m)   Wt 227 lb 3.2 oz (103.1 kg)   BMI 39.00 kg/m   Wt Readings from Last 3 Encounters:  10/01/16 227 lb 3.2 oz (103.1 kg)  09/18/16 223 lb (101.2 kg)  08/23/16 223 lb 9.6 oz (101.4 kg)    Gen: NAD, alert, cooperative with exam, NCAT EYES: EOMI, no conjunctival injection, or no icterus CV: NRRR, normal S1/S2, no murmur Resp: CTABL, no wheezes, normal WOB Abd: +BS, soft, NTND.  Ext: No edema, warm Neuro: Alert and oriented, strength b/l hand grip, knee ext/flex, ankle ext/flex MSK: normal muscle bulk  Assessment & Plan:  [name] was seen today for follow-up med problems  Diagnoses and all orders for this visit:  Burning tongue Stable, cont below, has been helpful -     amitriptyline (ELAVIL) 25 MG tablet; Take 1 tablet (25 mg total) by mouth at  bedtime.  Depression, unspecified depression type Stable, cont lexapro -     escitalopram (LEXAPRO) 20 MG tablet; Take 1 tablet (20 mg total) by mouth at bedtime.  GAD (generalized anxiety disorder) Stable, cont below -     escitalopram (LEXAPRO) 20 MG tablet; Take 1 tablet (20 mg total) by mouth at bedtime.  Essential hypertension Adequate contorl, cont below  Liver metastases No symptoms Following with oncology PET scan tomorrow  Follow up plan: Return in about 3 months (around 01/01/2017). Rebekah Found, MD [place]

## 2016-10-02 ENCOUNTER — Encounter (HOSPITAL_COMMUNITY)
Admission: RE | Admit: 2016-10-02 | Discharge: 2016-10-02 | Disposition: A | Discharge status: Home / Self Care | Payer: Medicare Other | Source: Ambulatory Visit | Attending: Internal Medicine | Admitting: Internal Medicine

## 2016-10-02 DIAGNOSIS — C787 Secondary malignant neoplasm of liver and intrahepatic bile duct: Secondary | ICD-10-CM | POA: Diagnosis not present

## 2016-10-02 DIAGNOSIS — C7A8 Other malignant neuroendocrine tumors: Secondary | ICD-10-CM | POA: Diagnosis not present

## 2016-10-02 MED ORDER — GALLIUM GA 68 DOTATATE IV KIT
5.4000 | PACK | Freq: Once | INTRAVENOUS | Status: AC
  Administered 2016-10-02: 5.4 via INTRAVENOUS

## 2016-10-09 ENCOUNTER — Ambulatory Visit (HOSPITAL_BASED_OUTPATIENT_CLINIC_OR_DEPARTMENT_OTHER): Payer: Medicare Other | Admitting: Internal Medicine

## 2016-10-09 ENCOUNTER — Encounter: Payer: Self-pay | Admitting: Internal Medicine

## 2016-10-09 VITALS — BP 124/57 | HR 75 | Temp 97.6°F | Resp 17 | Ht 64.0 in | Wt 225.4 lb

## 2016-10-09 DIAGNOSIS — C7A8 Other malignant neuroendocrine tumors: Secondary | ICD-10-CM

## 2016-10-09 DIAGNOSIS — Z5111 Encounter for antineoplastic chemotherapy: Secondary | ICD-10-CM

## 2016-10-09 DIAGNOSIS — Z7189 Other specified counseling: Secondary | ICD-10-CM

## 2016-10-09 DIAGNOSIS — C7B8 Other secondary neuroendocrine tumors: Secondary | ICD-10-CM | POA: Diagnosis not present

## 2016-10-09 DIAGNOSIS — C787 Secondary malignant neoplasm of liver and intrahepatic bile duct: Secondary | ICD-10-CM

## 2016-10-09 HISTORY — DX: Other specified counseling: Z71.89

## 2016-10-09 HISTORY — DX: Encounter for antineoplastic chemotherapy: Z51.11

## 2016-10-09 MED ORDER — TEMOZOLOMIDE 140 MG PO CAPS: 150.0000 mg/m2/d | ORAL_CAPSULE | Freq: Every day | ORAL | 2 refills | Status: DC

## 2016-10-09 MED ORDER — ONDANSETRON HCL 8 MG PO TABS: 8.0000 mg | ORAL_TABLET | Freq: Three times a day (TID) | ORAL | 1 refills | Status: DC | PRN

## 2016-10-09 MED ORDER — CAPECITABINE 500 MG PO TABS: 750.0000 mg/m2 | ORAL_TABLET | Freq: Two times a day (BID) | ORAL | 2 refills | Status: DC

## 2016-10-09 MED FILL — TEMOZOLOMIDE 140 MG CAPSULE: 140 | 5 days supply | Qty: 10 | Fill #0

## 2016-10-09 MED FILL — ONDANSETRON HCL 8 MG TABLET: 8 | 7 days supply | Qty: 20 | Fill #0

## 2016-10-09 MED FILL — CAPECITABINE 500 MG TABLET: 500 | 14 days supply | Qty: 84 | Fill #0

## 2016-10-09 NOTE — Progress Notes (Signed)
DISCONTINUE OFF PATHWAY REGIMEN - [Other Dx]     START OFF PATHWAY REGIMEN - [Other Dx]   Custom Intervention:Medical: [Capecitabine, Temozolomide]:     Capecitabine      Temozolomide   **Always confirm dose/schedule in your pharmacy ordering system**    Administration Notes: Xeloda 750 MG/M2 by mouth twice a day days 1-14 and Temodar 200 MG/M2 days-14 every 4 weeks  Intent of Therapy: Non-Curative / Palliative Intent, Discussed with Patient

## 2016-10-09 NOTE — Progress Notes (Signed)
[place] [phone]   Fax:([phone]) [place], [place] [address]  DIAGNOSIS: Metastatic intermediate. Neuroendocrine tumor of lung primary diagnosed in January 2018 and presented with small bilateral pulmonary nodules in addition to multiple liver metastasis.  PRIOR THERAPY: None  CURRENT THERAPY: Xeloda 750 MG/M2 twice a day days 1-14 and Temodar 150 MG/M2 days 10-14 every 4 weeks. First dose expected in the next few days.  INTERVAL HISTORY: [name] 83 y.o. female returns to the clinic today for follow-up visit accompanied by her husband. The patient is feeling fine today with no specific complaints. She denied having any chest pain, shortness of breath, cough or hemoptysis. She has no fever or chills. She denied having any nausea, vomiting, diarrhea or constipation. She just complaining of fatigue. She had several studies performed recently including PET scan for neuroendocrine tumor and she is here today for evaluation and discussion of her scan results and recommendation regarding treatment of her condition.  MEDICAL HISTORY: Past Medical History:  Diagnosis Date  . Acute respiratory failure with hypoxia ([place]) 10/23/2015  . Allergic rhinitis    PT. DENIES  . Anxiety   . Arthritis    NECK  . Ataxia   . Bradycardia    primarily nocturnal  . Burning tongue syndrome 25 years  . Cancer ([place])   . Cataract   . Cerebellar degeneration   . Chronic pericarditis   . Chronic urinary tract infection   . Complication of anesthesia    low o2 sats, coded 30 years ago  . CVA (cerebral infarction) 05/2003  . Depression   . Gait disorder   . Gastric polyps   . GERD (gastroesophageal reflux disease)   . High cholesterol   . Hyperlipidemia   . Hypertension   . Hypertension   . Hypotension   . Hypothyroidism   . IBS (irritable bowel syndrome)   . Obesity   . Paroxysmal atrial fibrillation  ([name])    chads2vasc score is 6,  she is felt to be a poor candidate for anticoagulation  . Personal history of arterial venous malformation (AVM)    right side of face  . Seizure disorder ([place])   . Seizures ([place]) 2003   " smelling"- Gabapentin "no problem"  . Shortness of breath dyspnea    with exertion  . Sternum fx 10/27/2013  . Stroke [facility]) 5 years ago   Right side of face weak, slurred speach-   . Thyroid disease   . TIA (transient ischemic attack)   . UTI (lower urinary tract infection) 03/27/2016   "frequently"    ALLERGIES:  is allergic to lisinopril.  MEDICATIONS:  Current Outpatient Prescriptions  Medication Sig Dispense Refill  . amitriptyline (ELAVIL) 25 MG tablet Take 1 tablet (25 mg total) by mouth at bedtime. 90 tablet 1  . Artificial Tear Ointment (DRY EYES OP) Place 1 drop into both eyes daily.    Marland Kitchen escitalopram (LEXAPRO) 20 MG tablet Take 1 tablet (20 mg total) by mouth at bedtime. 90 tablet 1  . fluticasone (FLONASE) 50 MCG/ACT nasal spray Place 2 sprays into both nostrils at bedtime. (Patient not taking: Reported on 10/01/2016) 16 g 3  . furosemide (LASIX) 40 MG tablet Take 40 mg by mouth daily as needed for fluid.    Marland Kitchen gabapentin (NEURONTIN) 300 MG capsule Take 1-2 capsules (300-600 mg total) by mouth 4 (four) times daily. [place]  capsule 1  . levothyroxine (SYNTHROID, LEVOTHROID) 75 MCG tablet TAKE 1 TABLET DAILY BEFORE BREAKFAST 90 tablet 0  . losartan (COZAAR) 50 MG tablet TAKE 1 TABLET DAILY 90 tablet 1  . lovastatin (MEVACOR) 40 MG tablet Take 1 tablet (40 mg total) by mouth at bedtime. 90 tablet 3  . Melatonin 3 MG TBDP Take 3-6 mg by mouth at bedtime as needed. (Patient taking differently: Take 3-6 mg by mouth at bedtime as needed (for sleep). ) 60 tablet 1  . nitrofurantoin (MACRODANTIN) 50 MG capsule Take 50 mg by mouth at bedtime.    Marland Kitchen omeprazole (PRILOSEC) 40 MG capsule Take 1 capsule (40 mg total) by mouth daily. 90 capsule 3  . Vitamin D, Ergocalciferol,  (DRISDOL) 50000 units CAPS capsule Take 1 capsule (50,000 Units total) by mouth every 7 (seven) days. 12 capsule 1   No current facility-administered medications for this visit.     SURGICAL HISTORY:  Past Surgical History:  Procedure Laterality Date  . APPENDECTOMY  83 years old  . COLONOSCOPY  2006, 2009  . COLONOSCOPY WITH PROPOFOL N/A 03/28/2016   Procedure: COLONOSCOPY WITH PROPOFOL;  Surgeon: Gatha Mayer, MD;  Location: [facility];  Service: Endoscopy;  Laterality: N/A;  . cyst removed  35 years ago  . EP IMPLANTABLE DEVICE N/A 01/06/2015   Procedure: Loop Recorder Insertion;  Surgeon: Thompson Grayer, MD;  Location: [facility];  Service: Cardiovascular;  Laterality: N/A;  . EYE SURGERY Right    Cataract  . KNEE ARTHROSCOPY Right 11/14/2006  . KNEE ARTHROSCOPY Bilateral 5 and 6 years ago  . KNEE ARTHROSCOPY WITH LATERAL MENISECTOMY  07/03/2012   Procedure: KNEE ARTHROSCOPY WITH LATERAL MENISECTOMY;  Surgeon: Magnus Sinning, MD;  Location: [facility];  Service: Orthopedics;  Laterality: Left;  with Partial Lateral Menisectomy and Medial Menisectomy. Shaving of medial and lateral femoral condyles. Shaving of patella. Removal of a loose body  . tibial and fibular internal fixation Left   . TOTAL ABDOMINAL HYSTERECTOMY  83 years old  . UPPER GASTROINTESTINAL ENDOSCOPY  2009, 2013    REVIEW OF SYSTEMS:  Constitutional: positive for fatigue Eyes: negative Ears, nose, mouth, throat, and face: negative Respiratory: negative Cardiovascular: negative Gastrointestinal: negative Genitourinary:negative Integument/breast: negative Hematologic/lymphatic: negative Musculoskeletal:negative Neurological: negative Behavioral/Psych: negative Endocrine: negative Allergic/Immunologic: negative   PHYSICAL EXAMINATION: General appearance: alert, cooperative, fatigued and no distress Head: Normocephalic, without obvious abnormality, atraumatic Neck: no adenopathy, no JVD, supple,  symmetrical, trachea midline and thyroid not enlarged, symmetric, no tenderness/mass/nodules Lymph nodes: Cervical, supraclavicular, and axillary nodes normal. Resp: clear to auscultation bilaterally Back: symmetric, no curvature. ROM normal. No CVA tenderness. Cardio: regular rate and rhythm, S1, S2 normal, no murmur, click, rub or gallop GI: soft, non-tender; bowel sounds normal; no masses,  no organomegaly Extremities: extremities normal, atraumatic, no cyanosis or edema Neurologic: Alert and oriented X 3, normal strength and tone. Normal symmetric reflexes. Normal coordination and gait  ECOG PERFORMANCE STATUS: 1 - Symptomatic but completely ambulatory  Blood pressure (!) 124/57, pulse 75, temperature 97.6 F (36.4 C), temperature source Oral, resp. rate 17, height '5\' 4"'$  (1.626 m), weight 225 lb 6.4 oz (102.2 kg), SpO2 99 %.  LABORATORY DATA: Lab Results  Component Value Date   WBC 5.2 08/20/2016   HGB 13.4 08/20/2016   HCT 41.9 08/20/2016   MCV 92.7 08/20/2016   PLT 207 08/20/2016      Chemistry      Component Value Date/Time   NA 137 08/02/2016  1534   K 4.6 08/02/2016 1534   CL 96 08/02/2016 1534   CO2 25 08/02/2016 1534   BUN 11 08/02/2016 1534   CREATININE 0.84 08/02/2016 1534      Component Value Date/Time   CALCIUM 10.1 08/02/2016 1534   ALKPHOS 67 07/14/2016 0600   AST 47 (H) 07/14/2016 0600   ALT 18 07/14/2016 0600   BILITOT 0.5 07/14/2016 0600   BILITOT 0.4 04/12/2016 1455       RADIOGRAPHIC STUDIES: Nm Pet (netspot Ga 68 Dotatate) Skull Base To Mid Thigh  Result Date: 10/02/2016 CLINICAL DATA:  Liver metastasis. Neuroendocrine tumor of the liver on biopsy EXAM: NUCLEAR MEDICINE PET SKULL BASE TO THIGH TECHNIQUE: 5.4 mCi Ga 68 DOTATATE was injected intravenously. Full-ring PET imaging was performed from the skull base to thigh after the radiotracer. CT data was obtained and used for attenuation correction and anatomic localization. COMPARISON:  CT chest  07/14/2016,  CT abdomen 07/13/2016. FINDINGS: NECK No radiotracer activity in neck lymph nodes. CHEST Bilateral pulmonary nodules again demonstrated. RIGHT upper lobe nodule measuring 10 mm (image 50, series 4) has faint radiotracer activity. Other smaller pulmonary nodules in the LEFT lung do not have a clear radiotracer activity. There is faint radiotracer activity within small axillary lymph nodes which are similar to background mediastinal activity. ABDOMEN/PELVIS Rounded lesion in the RIGHT hepatic lobe measures 2.9 cm (image 77, series 4) does not have associated radiotracer activity above background liver activity. No discrete focal activity in the liver above background. Similar small lesion in the LEFT hepatic lobe measuring 14 mm without associated radiotracer activity. Within the tail of the pancreas there is a focus of radiotracer activity above the background pancreatic tissue activity with SUV max equal 12.4. Subtle increase in density measuring 12 mm on image 87, series 4. There is activity seen within the uncinate of the pancreas which is a typical physiologic. Physiologic activity noted within the small bowel. No focal lesion within the mesenteries. Physiologic activity noted adrenal glands and kidneys as well as spleen. SKELETON No focal  activity to suggest skeletal metastasis. IMPRESSION: 1. No neuroendocrine tumor specific radiotracer accumulation within the hepatic lesions indicates a pain neuroendocrine tumor on the more poorly differentiated spectrum. 2. Focus of discrete radiotracer accumulation within tail the pancreas. Recommend MRI pancreatic protocol without and with contrast to evaluate for a pancreatic tail primary. 3. Very low radiotracer activity associated the RIGHT upper lobe pulmonary nodule and no activity associated smaller LEFT nodules. Lesions remain indeterminate but suspicious for poorly differentiated neuroendocrine tumor. Electronically Signed   By: Suzy Bouchard M.D.    On: 10/02/2016 09:57    ASSESSMENT AND PLAN: This is a very pleasant 83 years old white female with metastatic intermediate grade neuroendocrine carcinoma of questionable lung primary with metastatic disease to the liver and pancreas. Her recent PET scan for neuroendocrine tumor showed no activity in the liver but there was mild activity in the pulmonary nodules as well as the pancreas. No other evidence of metastatic disease. I had a lengthy discussion with the patient and her husband today about her current condition and treatment options. I discussed with him the goals of care. I gave the patient the option of palliative care and hospice referral versus consideration of treatment with oral Xeloda 750 MG/M2 on days 1-14 in addition to Temodar 150 MG/M 2 on days 10-14 every 4 weeks. I discussed with the patient adverse effect of this treatment including but not limited to alopecia, myelosuppression, nausea  and vomiting, peripheral neuropathy, liver or renal dysfunction as well as hand-foot syndrome. She is interested in proceeding with the treatment with the oral medications. I will also order Zofran 8 mg by mouth every 8 hours as needed for nausea. We will arrange for the patient to have a chemotherapy education class before starting the first dose of her treatment. I would send her prescription to [facility] for refill. I would see her back for follow-up visit in 3 weeks for evaluation and management of any adverse effect of her treatment. The patient was advised to call immediately if she has any concerning symptoms in the interval  The patient voices understanding of current disease status and treatment options and is in agreement with the current care plan.  All questions were answered. The patient knows to call the clinic with any problems, questions or concerns. We can certainly see the patient much sooner if necessary.  I spent 15 minutes counseling the patient face  to face. The total time spent in the appointment was 25 minutes.  Disclaimer: This note was dictated with voice recognition software. Similar sounding words can inadvertently be transcribed and may not be corrected upon review.

## 2016-10-11 ENCOUNTER — Telehealth: Payer: Self-pay | Admitting: Internal Medicine

## 2016-10-11 ENCOUNTER — Telehealth: Payer: Self-pay | Admitting: Pediatrics

## 2016-10-11 NOTE — Telephone Encounter (Signed)
Appointments scheduled per 3/13 LOS. Spoke with patient in regards to next scheduled appointments. Also mailed out new schedule.

## 2016-10-12 ENCOUNTER — Telehealth: Payer: Self-pay | Admitting: Pediatrics

## 2016-10-12 NOTE — Telephone Encounter (Signed)
What is the name of the medication? Lexapro  Have you contacted your pharmacy to request a refill? Yes but it can't be filled until provider gives more information about it not interfering with other meds she is taking  Which pharmacy would you like this sent to? [facility]   Patient notified that their request is being sent to the clinical staff for review and that they should receive a call once it is complete. If they do not receive a call within 24 hours they can check with their pharmacy or our office.

## 2016-10-15 NOTE — Telephone Encounter (Signed)
done

## 2016-10-16 ENCOUNTER — Encounter: Payer: Self-pay | Admitting: *Deleted

## 2016-10-16 ENCOUNTER — Other Ambulatory Visit: Payer: Medicare Other

## 2016-10-16 LAB — CUP PACEART REMOTE DEVICE CHECK
Date Time Interrogation Session: 20180301174007
MDC IDC PG IMPLANT DT: 20160609

## 2016-10-25 ENCOUNTER — Other Ambulatory Visit: Payer: Self-pay | Admitting: Family

## 2016-10-25 DIAGNOSIS — K219 Gastro-esophageal reflux disease without esophagitis: Secondary | ICD-10-CM

## 2016-10-25 DIAGNOSIS — E785 Hyperlipidemia, unspecified: Secondary | ICD-10-CM

## 2016-10-26 ENCOUNTER — Telehealth: Payer: Self-pay | Admitting: *Deleted

## 2016-10-26 MED ORDER — HYDROCODONE-ACETAMINOPHEN 5-325 MG PO TABS: 1.0000 | ORAL_TABLET | Freq: Four times a day (QID) | ORAL | 0 refills | Status: DC | PRN

## 2016-10-26 MED FILL — HYDROCODON-APAP 5-325: 5-325 | 8 days supply | Qty: 30 | Fill #0

## 2016-10-26 NOTE — Telephone Encounter (Signed)
Called patient and told her that her prescription was ready for pickup. Patient verbalized understanding.

## 2016-10-26 NOTE — Telephone Encounter (Addendum)
Received call from pt stating that she is having a lot of pain in her R side above hip bone which started on Monday.  She states she didn't sleep at all last night.  She just finished first  round of chemo-temador & capcitabine.  She reports that aleve doesn't help at all.  Message will be routed to Dr Corliss Parish 5.   Discussed with Dr Alen Blew & he suggested hydrocodone 5/325 every 6 hours prn.  Will notify pt when script is ready for pick up.

## 2016-10-29 ENCOUNTER — Encounter (HOSPITAL_COMMUNITY): Payer: Self-pay | Admitting: Emergency Medicine

## 2016-10-29 ENCOUNTER — Emergency Department (HOSPITAL_COMMUNITY): Payer: Medicare Other

## 2016-10-29 ENCOUNTER — Ambulatory Visit (INDEPENDENT_AMBULATORY_CARE_PROVIDER_SITE_OTHER): Payer: Medicare Other | Admitting: *Deleted

## 2016-10-29 ENCOUNTER — Telehealth: Payer: Self-pay | Admitting: Internal Medicine

## 2016-10-29 ENCOUNTER — Emergency Department (HOSPITAL_COMMUNITY)
Admission: EM | Admit: 2016-10-29 | Discharge: 2016-10-29 | Disposition: A | Discharge status: Home / Self Care | Payer: Medicare Other | Attending: Emergency Medicine | Admitting: Emergency Medicine

## 2016-10-29 DIAGNOSIS — I10 Essential (primary) hypertension: Secondary | ICD-10-CM | POA: Insufficient documentation

## 2016-10-29 DIAGNOSIS — Z87891 Personal history of nicotine dependence: Secondary | ICD-10-CM | POA: Insufficient documentation

## 2016-10-29 DIAGNOSIS — R002 Palpitations: Secondary | ICD-10-CM

## 2016-10-29 DIAGNOSIS — E039 Hypothyroidism, unspecified: Secondary | ICD-10-CM | POA: Diagnosis not present

## 2016-10-29 DIAGNOSIS — Z79899 Other long term (current) drug therapy: Secondary | ICD-10-CM | POA: Diagnosis not present

## 2016-10-29 DIAGNOSIS — Z8673 Personal history of transient ischemic attack (TIA), and cerebral infarction without residual deficits: Secondary | ICD-10-CM | POA: Insufficient documentation

## 2016-10-29 DIAGNOSIS — R1030 Lower abdominal pain, unspecified: Secondary | ICD-10-CM | POA: Diagnosis not present

## 2016-10-29 DIAGNOSIS — K2970 Gastritis, unspecified, without bleeding: Secondary | ICD-10-CM | POA: Diagnosis not present

## 2016-10-29 DIAGNOSIS — R1011 Right upper quadrant pain: Secondary | ICD-10-CM | POA: Diagnosis not present

## 2016-10-29 DIAGNOSIS — R140 Abdominal distension (gaseous): Secondary | ICD-10-CM | POA: Diagnosis not present

## 2016-10-29 LAB — CBC WITH DIFFERENTIAL/PLATELET
Basophils Absolute: 0 10*3/uL (ref 0.0–0.1)
Basophils Relative: 1 %
EOS PCT: 9 %
Eosinophils Absolute: 0.5 10*3/uL (ref 0.0–0.7)
HCT: 40.3 % (ref 36.0–46.0)
Hemoglobin: 13.2 g/dL (ref 12.0–15.0)
LYMPHS ABS: 1.3 10*3/uL (ref 0.7–4.0)
Lymphocytes Relative: 26 %
MCH: 29.7 pg (ref 26.0–34.0)
MCHC: 32.8 g/dL (ref 30.0–36.0)
MCV: 90.6 fL (ref 78.0–100.0)
Monocytes Absolute: 0.7 10*3/uL (ref 0.1–1.0)
Monocytes Relative: 13 %
Neutro Abs: 2.6 10*3/uL (ref 1.7–7.7)
Neutrophils Relative %: 51 %
PLATELETS: 213 10*3/uL (ref 150–400)
RBC: 4.45 MIL/uL (ref 3.87–5.11)
RDW: 14.3 % (ref 11.5–15.5)
WBC: 5.1 10*3/uL (ref 4.0–10.5)

## 2016-10-29 LAB — COMPREHENSIVE METABOLIC PANEL
ALK PHOS: 74 U/L (ref 38–126)
ALT: 27 U/L (ref 14–54)
AST: 32 U/L (ref 15–41)
Albumin: 3.7 g/dL (ref 3.5–5.0)
Anion gap: 6 (ref 5–15)
BUN: 13 mg/dL (ref 6–20)
CHLORIDE: 100 mmol/L — AB (ref 101–111)
CO2: 29 mmol/L (ref 22–32)
CREATININE: 0.98 mg/dL (ref 0.44–1.00)
Calcium: 9.7 mg/dL (ref 8.9–10.3)
GFR calc Af Amer: >60 mL/min (ref >60)
GFR calc non Af Amer: 53 mL/min — ABNORMAL LOW (ref >60)
Glucose, Bld: 119 mg/dL — ABNORMAL HIGH (ref 65–99)
Potassium: 4.6 mmol/L (ref 3.5–5.1)
Sodium: 135 mmol/L (ref 135–145)
TOTAL PROTEIN: 6.9 g/dL (ref 6.5–8.1)
Total Bilirubin: 0.5 mg/dL (ref 0.3–1.2)

## 2016-10-29 LAB — URINALYSIS, ROUTINE W REFLEX MICROSCOPIC
BILIRUBIN URINE: NEGATIVE
Glucose, UA: NEGATIVE mg/dL
HGB URINE DIPSTICK: NEGATIVE
KETONES UR: NEGATIVE mg/dL
Leukocytes, UA: NEGATIVE
Nitrite: NEGATIVE
PROTEIN: NEGATIVE mg/dL
Specific Gravity, Urine: 1.011 (ref 1.005–1.030)
pH: 6 (ref 5.0–8.0)

## 2016-10-29 MED ORDER — OXYCODONE-ACETAMINOPHEN 5-325 MG PO TABS
2.0000 | ORAL_TABLET | Freq: Once | ORAL | Status: AC
  Administered 2016-10-29: 2 via ORAL
  Filled 2016-10-29: qty 2

## 2016-10-29 MED ORDER — SODIUM CHLORIDE 0.9 % IV BOLUS (SEPSIS)
1000.0000 mL | Freq: Once | INTRAVENOUS | Status: AC
  Administered 2016-10-29: 1000 mL via INTRAVENOUS

## 2016-10-29 MED ORDER — SODIUM CHLORIDE 0.9 % IV BOLUS (SEPSIS)
500.0000 mL | Freq: Once | INTRAVENOUS | Status: AC
  Administered 2016-10-29: 500 mL via INTRAVENOUS

## 2016-10-29 MED ORDER — ONDANSETRON HCL 4 MG/2ML IJ SOLN
4.0000 mg | Freq: Once | INTRAMUSCULAR | Status: AC
  Administered 2016-10-29: 4 mg via INTRAVENOUS
  Filled 2016-10-29: qty 2

## 2016-10-29 MED ORDER — HYDROMORPHONE HCL 1 MG/ML IJ SOLN
1.0000 mg | Freq: Once | INTRAMUSCULAR | Status: AC
  Administered 2016-10-29: 1 mg via INTRAVENOUS
  Filled 2016-10-29: qty 1

## 2016-10-29 MED ORDER — OXYCODONE-ACETAMINOPHEN 5-325 MG PO TABS: 1.0000 | ORAL_TABLET | ORAL | 0 refills | Status: DC | PRN

## 2016-10-29 MED FILL — CAPECITABINE 500 MG TABLET: 500 | 14 days supply | Qty: 84 | Fill #1

## 2016-10-29 MED FILL — TEMOZOLOMIDE 140 MG CAPSULE: 140 | 5 days supply | Qty: 10 | Fill #1

## 2016-10-29 NOTE — Telephone Encounter (Signed)
Call day - moved 4/4 lab/fu time to 11:30 am. Spoke with patient spouse re time change.

## 2016-10-29 NOTE — Discharge Instructions (Signed)
Follow-up as planned this week with your doctor. Return sooner if problems

## 2016-10-29 NOTE — Progress Notes (Signed)
Carelink Summary Report / Loop Recorder 

## 2016-10-29 NOTE — ED Notes (Signed)
Bed: WA01 Expected date:  Expected time:  Means of arrival: Ambulance Comments:  EMS abdominal pain 83 yo

## 2016-10-29 NOTE — ED Notes (Signed)
Unable to give urine sample at this time.  

## 2016-10-29 NOTE — ED Provider Notes (Signed)
[facility] DEPT Provider Note   CSN: [id number] Arrival date & time: 10/29/16  1140     History   Chief Complaint Chief Complaint  Patient presents with  . Abdominal Pain    HPI [name] is a 83 y.o. female.  Patient complains of right flank pain and right upper quadrant and lower quadrant pain. Patient has a history of metastatic cancer and she presently getting chemotherapy   The history is provided by the patient. No language interpreter was used.  Abdominal Pain   This is a recurrent problem. The current episode started more than 2 days ago. The problem occurs constantly. The problem has not changed since onset.The pain is associated with an unknown factor. The pain is located in the RUQ and RLQ. The pain is at a severity of 6/10. The pain is moderate. Pertinent negatives include diarrhea, frequency, hematuria and headaches.    Past Medical History:  Diagnosis Date  . Acute respiratory failure with hypoxia ([place]) 10/23/2015  . Allergic rhinitis    PT. DENIES  . Anxiety   . Arthritis    NECK  . Ataxia   . Bradycardia    primarily nocturnal  . Burning tongue syndrome 25 years  . Cancer ([place])   . Cataract   . Cerebellar degeneration   . Chronic pericarditis   . Chronic urinary tract infection   . Complication of anesthesia    low o2 sats, coded 30 years ago  . CVA (cerebral infarction) 05/2003  . Depression   . Encounter for antineoplastic chemotherapy 10/09/2016  . Gait disorder   . Gastric polyps   . GERD (gastroesophageal reflux disease)   . Goals of care, counseling/discussion 10/09/2016  . High cholesterol   . Hyperlipidemia   . Hypertension   . Hypertension   . Hypotension   . Hypothyroidism   . IBS (irritable bowel syndrome)   . Obesity   . Paroxysmal atrial fibrillation ([name])    chads2vasc score is 6,  she is felt to be a poor candidate for anticoagulation  . Personal history of arterial venous malformation (AVM)    right side of face  .  Seizure disorder ([place])   . Seizures ([place]) 2003   " smelling"- Gabapentin "no problem"  . Shortness of breath dyspnea    with exertion  . Sternum fx 10/27/2013  . Stroke [facility]) 5 years ago   Right side of face weak, slurred speach-   . Thyroid disease   . TIA (transient ischemic attack)   . UTI (lower urinary tract infection) 03/27/2016   "frequently"    Patient Active Problem List   Diagnosis Date Noted  . Goals of care, counseling/discussion 10/09/2016  . Encounter for antineoplastic chemotherapy 10/09/2016  . Neuroendocrine cancer ([place]) 08/23/2016  . Liver metastases ([place]) 08/23/2016  . Late effect of cerebrovascular accident (CVA) 08/02/2016  . H/O: CVA (cerebrovascular accident) 07/14/2016  . Meningioma ([place]) 07/14/2016  . Asthmatic bronchitis 10/23/2015  . GAD (generalized anxiety disorder) 09/30/2015  . Peripheral edema 09/30/2015  . Insomnia 09/30/2015  . Localization-related partial epilepsy with simple partial seizures ([place]) 06/27/2015  . Allergic rhinitis 05/24/2015  . Syncope 01/06/2015  . Sinus bradycardia 01/03/2015  . Paroxysmal atrial fibrillation ([place]) 01/03/2015  . Fatigue 01/03/2015  . Morbid obesity ([place]) 01/03/2015  . Solitary pulmonary nodule 06/02/2014  . Thyroid nodule 05/08/2014  . Palpitation 05/07/2014  . Depression   . Seizure disorder ([place])   . Hypothyroidism   . Cerebellar degeneration   .  IRRITABLE BOWEL SYNDROME 02/18/2008  . Hyperlipemia 09/10/2006  . Essential hypertension 09/10/2006  . GERD 09/10/2006  . CVA (cerebral infarction) 05/31/2003    Past Surgical History:  Procedure Laterality Date  . APPENDECTOMY  83 years old  . COLONOSCOPY  2006, 2009  . COLONOSCOPY WITH PROPOFOL N/A 03/28/2016   Procedure: COLONOSCOPY WITH PROPOFOL;  Surgeon: Gatha Mayer, MD;  Location: [facility];  Service: Endoscopy;  Laterality: N/A;  . cyst removed  35 years ago  . EP IMPLANTABLE DEVICE N/A 01/06/2015   Procedure: Loop Recorder Insertion;   Surgeon: Thompson Grayer, MD;  Location: [facility];  Service: Cardiovascular;  Laterality: N/A;  . EYE SURGERY Right    Cataract  . KNEE ARTHROSCOPY Right 11/14/2006  . KNEE ARTHROSCOPY Bilateral 5 and 6 years ago  . KNEE ARTHROSCOPY WITH LATERAL MENISECTOMY  07/03/2012   Procedure: KNEE ARTHROSCOPY WITH LATERAL MENISECTOMY;  Surgeon: Magnus Sinning, MD;  Location: [facility];  Service: Orthopedics;  Laterality: Left;  with Partial Lateral Menisectomy and Medial Menisectomy. Shaving of medial and lateral femoral condyles. Shaving of patella. Removal of a loose body  . tibial and fibular internal fixation Left   . TOTAL ABDOMINAL HYSTERECTOMY  83 years old  . UPPER GASTROINTESTINAL ENDOSCOPY  2009, 2013    OB History    No data available       Home Medications    Prior to Admission medications   Medication Sig Start Date End Date Taking? Authorizing Provider  amitriptyline (ELAVIL) 25 MG tablet Take 1 tablet (25 mg total) by mouth at bedtime. 10/01/16  Yes Eustaquio Maize, MD  capecitabine (XELODA) 500 MG tablet Take 3 tablets (1,500 mg total) by mouth 2 (two) times daily after a meal. 10/09/16  Yes Curt Bears, MD  escitalopram (LEXAPRO) 20 MG tablet Take 1 tablet (20 mg total) by mouth at bedtime. 10/01/16  Yes Eustaquio Maize, MD  fluticasone (FLONASE) 50 MCG/ACT nasal spray Place 2 sprays into both nostrils at bedtime. 09/20/16  Yes Eustaquio Maize, MD  furosemide (LASIX) 40 MG tablet Take 40 mg by mouth daily as needed for edema.    Yes Historical Provider, MD  gabapentin (NEURONTIN) 300 MG capsule Take 1-2 capsules (300-600 mg total) by mouth 4 (four) times daily. 08/23/16  Yes Eustaquio Maize, MD  HYDROcodone-acetaminophen (NORCO/VICODIN) 5-325 MG tablet Take 1 tablet by mouth every 6 (six) hours as needed for moderate pain. 10/26/16  Yes Wyatt Portela, MD  hydroxypropyl methylcellulose / hypromellose (ISOPTO TEARS / GONIOVISC) 2.5 % ophthalmic solution Place 1 drop into both eyes  at bedtime.   Yes Historical Provider, MD  levothyroxine (SYNTHROID, LEVOTHROID) 75 MCG tablet TAKE 1 TABLET DAILY BEFORE BREAKFAST 09/25/16  Yes Sharion Balloon, FNP  losartan (COZAAR) 50 MG tablet TAKE 1 TABLET DAILY 07/17/16  Yes Sharion Balloon, FNP  lovastatin (MEVACOR) 40 MG tablet TAKE 1 TABLET AT BEDTIME 10/25/16  Yes Eustaquio Maize, MD  Melatonin 3 MG TBDP Take 3-6 mg by mouth at bedtime as needed. Patient taking differently: Take 3-6 mg by mouth at bedtime as needed (for sleep).  08/02/16  Yes Eustaquio Maize, MD  nitrofurantoin (MACRODANTIN) 50 MG capsule Take 50 mg by mouth at bedtime.   Yes Historical Provider, MD  omeprazole (PRILOSEC) 40 MG capsule TAKE 1 CAPSULE DAILY 10/25/16  Yes Eustaquio Maize, MD  ondansetron (ZOFRAN) 8 MG tablet Take 1 tablet (8 mg total) by mouth every 8 (eight)  hours as needed for nausea or vomiting. 10/09/16  Yes Curt Bears, MD  temozolomide (TEMODAR) 140 MG capsule Take 2 capsules (280 mg total) by mouth daily. May take on an empty stomach or at bedtime to decrease nausea & vomiting. 10/09/16  Yes Curt Bears, MD  Vitamin D, Ergocalciferol, (DRISDOL) 50000 units CAPS capsule Take 1 capsule (50,000 Units total) by mouth every 7 (seven) days. Patient taking differently: Take 50,000 Units by mouth every Friday.  08/23/16  Yes Eustaquio Maize, MD  oxyCODONE-acetaminophen (PERCOCET) 5-325 MG tablet Take 1-2 tablets by mouth every 4 (four) hours as needed. 10/29/16   Milton Ferguson, MD    Family History Family History  Problem Relation Age of Onset  . Heart attack Father 51    fatal  . Coronary artery disease Brother   . Diabetes Brother   . Prostate cancer Brother   . Prostate cancer Son     Social History Social History  Substance Use Topics  . Smoking status: Former Smoker    Packs/day: 0.25    Years: 10.00    Types: Cigarettes    Quit date: 07/31/1975  . Smokeless tobacco: Never Used  . Alcohol use No     Comment: Rare- maybe a drink ever 2  years     Allergies   Lisinopril   Review of Systems Review of Systems  Constitutional: Negative for appetite change and fatigue.  HENT: Negative for congestion, ear discharge and sinus pressure.   Eyes: Negative for discharge.  Respiratory: Negative for cough.   Cardiovascular: Negative for chest pain.  Gastrointestinal: Positive for abdominal pain. Negative for diarrhea.  Genitourinary: Negative for frequency and hematuria.  Musculoskeletal: Negative for back pain.  Skin: Negative for rash.  Neurological: Negative for seizures and headaches.  Psychiatric/Behavioral: Negative for hallucinations.     Physical Exam Updated Vital Signs BP 132/63 (BP Location: Right Arm)   Pulse 77   Temp 97.8 F (36.6 C) (Oral)   Resp 17   Ht '5\' 4"'$  (1.626 m)   Wt 225 lb (102.1 kg)   SpO2 93%   BMI 38.62 kg/m   Physical Exam  Constitutional: She is oriented to person, place, and time. She appears well-developed.  HENT:  Head: Normocephalic.  Eyes: Conjunctivae and EOM are normal. No scleral icterus.  Neck: Neck supple. No thyromegaly present.  Cardiovascular: Normal rate and regular rhythm.  Exam reveals no gallop and no friction rub.   No murmur heard. Pulmonary/Chest: No stridor. She has no wheezes. She has no rales. She exhibits no tenderness.  Abdominal: She exhibits no distension. There is tenderness. There is no rebound.  Tender right flank and right upper quadrant and lower quadrant  Musculoskeletal: Normal range of motion. She exhibits no edema.  Lymphadenopathy:    She has no cervical adenopathy.  Neurological: She is oriented to person, place, and time. She exhibits normal muscle tone. Coordination normal.  Skin: No rash noted. No erythema.  Psychiatric: She has a normal mood and affect. Her behavior is normal.     ED Treatments / Results  Labs (all labs ordered are listed, but only abnormal results are displayed) Labs Reviewed  COMPREHENSIVE METABOLIC PANEL -  Abnormal; Notable for the following:       Result Value   Chloride 100 (*)    Glucose, Bld 119 (*)    GFR calc non Af Amer 53 (*)    All other components within normal limits  CBC WITH DIFFERENTIAL/PLATELET  URINALYSIS, ROUTINE  W REFLEX MICROSCOPIC    EKG  EKG Interpretation None       Radiology Ct Renal Stone Study  Result Date: 10/29/2016 CLINICAL DATA:  Right groin pain for 1 week. EXAM: CT ABDOMEN AND PELVIS WITHOUT CONTRAST TECHNIQUE: Multidetector CT imaging of the abdomen and pelvis was performed following the standard protocol without IV contrast. COMPARISON:  CT scan of July 13, 2016. FINDINGS: Lower chest: No acute abnormality. Hepatobiliary: 3.2 cm right hepatic lesion is noted consistent with metastatic disease. 2.2 cm left hepatic lesion is noted which is significantly enlarged compared to prior exam and also consistent with metastatic disease. No gallstones are noted. Pancreas: Unremarkable. No pancreatic ductal dilatation or surrounding inflammatory changes. Spleen: Normal in size without focal abnormality. Adrenals/Urinary Tract: Adrenal glands appear normal. Small nonobstructive left renal calculus is noted. No hydronephrosis or renal obstruction is noted. Urinary bladder is unremarkable. Stomach/Bowel: There is no evidence of bowel obstruction. Vascular/Lymphatic: Aortic atherosclerosis. No enlarged abdominal or pelvic lymph nodes. Reproductive: Status post hysterectomy. No adnexal masses. Other: No abdominal wall hernia or abnormality. No abdominopelvic ascites. Musculoskeletal: No acute or significant osseous findings. IMPRESSION: Worsening hepatic metastatic disease as described above. Nonobstructive left renal calculus. No hydronephrosis or renal obstruction is noted. Aortic atherosclerosis. Electronically Signed   By: Marijo Conception, M.D.   On: 10/29/2016 13:48    Procedures Procedures (including critical care time)  Medications Ordered in ED Medications    HYDROmorphone (DILAUDID) injection 1 mg (not administered)  ondansetron (ZOFRAN) injection 4 mg (not administered)  sodium chloride 0.9 % bolus 1,000 mL (not administered)  HYDROmorphone (DILAUDID) injection 1 mg (1 mg Intravenous Given 10/29/16 1225)  ondansetron (ZOFRAN) injection 4 mg (4 mg Intravenous Given 10/29/16 1225)  sodium chloride 0.9 % bolus 500 mL (500 mLs Intravenous New Bag/Given 10/29/16 1226)     Initial Impression / Assessment and Plan / ED Course  I have reviewed the triage vital signs and the nursing notes.  Pertinent labs & imaging results that were available during my care of the patient were reviewed by me and considered in my medical decision making (see chart for details).     CT scan shows worsening metastatic disease in her liver. Patient will be prescribed Percocet and will follow-up this week with her cancer doctor  Final Clinical Impressions(s) / ED Diagnoses   Final diagnoses:  Right upper quadrant abdominal pain    New Prescriptions New Prescriptions   OXYCODONE-ACETAMINOPHEN (PERCOCET) 5-325 MG TABLET    Take 1-2 tablets by mouth every 4 (four) hours as needed.     Milton Ferguson, MD 10/29/16 [phone]

## 2016-10-29 NOTE — ED Triage Notes (Signed)
Per EMS. Pt has right upper and lower abdominal pain that radiates to the flank area.  Pt has stage 4 lung and liver cancer and just finished her first chemo treatment 2 days ago. Oncology doc gave her hydrocodone for home use but pt has had no relief.  Pt states she came here for relief.  Denies nausea, vomiting, diarrhea, or any urinary symptoms. A&O x 4.

## 2016-10-31 ENCOUNTER — Encounter: Payer: Self-pay | Admitting: Internal Medicine

## 2016-10-31 ENCOUNTER — Telehealth: Payer: Self-pay | Admitting: Internal Medicine

## 2016-10-31 ENCOUNTER — Ambulatory Visit (HOSPITAL_BASED_OUTPATIENT_CLINIC_OR_DEPARTMENT_OTHER): Payer: Medicare Other | Admitting: Internal Medicine

## 2016-10-31 ENCOUNTER — Other Ambulatory Visit (HOSPITAL_BASED_OUTPATIENT_CLINIC_OR_DEPARTMENT_OTHER): Payer: Medicare Other

## 2016-10-31 VITALS — BP 108/72 | HR 70 | Temp 97.7°F | Resp 16 | Wt 226.5 lb

## 2016-10-31 DIAGNOSIS — C7B8 Other secondary neuroendocrine tumors: Secondary | ICD-10-CM

## 2016-10-31 DIAGNOSIS — Z5111 Encounter for antineoplastic chemotherapy: Secondary | ICD-10-CM

## 2016-10-31 DIAGNOSIS — C7A8 Other malignant neuroendocrine tumors: Secondary | ICD-10-CM | POA: Diagnosis not present

## 2016-10-31 DIAGNOSIS — C787 Secondary malignant neoplasm of liver and intrahepatic bile duct: Secondary | ICD-10-CM

## 2016-10-31 DIAGNOSIS — Z7189 Other specified counseling: Secondary | ICD-10-CM

## 2016-10-31 LAB — COMPREHENSIVE METABOLIC PANEL
ALBUMIN: 3.6 g/dL (ref 3.5–5.0)
ALK PHOS: 81 U/L (ref 40–150)
ALT: 24 U/L (ref 0–55)
ANION GAP: 6 meq/L (ref 3–11)
AST: 28 U/L (ref 5–34)
BILIRUBIN TOTAL: 0.39 mg/dL (ref 0.20–1.20)
BUN: 9.2 mg/dL (ref 7.0–26.0)
CALCIUM: 9.8 mg/dL (ref 8.4–10.4)
CO2: 27 mEq/L (ref 22–29)
Chloride: 102 mEq/L (ref 98–109)
Creatinine: 0.9 mg/dL (ref 0.6–1.1)
EGFR: 57 mL/min/{1.73_m2} — ABNORMAL LOW (ref >90)
Glucose: 109 mg/dl (ref 70–140)
Potassium: 5.2 mEq/L — ABNORMAL HIGH (ref 3.5–5.1)
Sodium: 135 mEq/L — ABNORMAL LOW (ref 136–145)
Total Protein: 6.5 g/dL (ref 6.4–8.3)

## 2016-10-31 LAB — CUP PACEART REMOTE DEVICE CHECK
Implantable Pulse Generator Implant Date: 20160609
MDC IDC SESS DTM: 20180331181244

## 2016-10-31 LAB — CBC WITH DIFFERENTIAL/PLATELET
BASO%: 1.1 % (ref 0.0–2.0)
Basophils Absolute: 0 10*3/uL (ref 0.0–0.1)
EOS ABS: 0.4 10*3/uL (ref 0.0–0.5)
EOS%: 8.6 % — ABNORMAL HIGH (ref 0.0–7.0)
HCT: 38.1 % (ref 34.8–46.6)
HGB: 12.6 g/dL (ref 11.6–15.9)
LYMPH%: 28.7 % (ref 14.0–49.7)
MCH: 30.1 pg (ref 25.1–34.0)
MCHC: 32.9 g/dL (ref 31.5–36.0)
MCV: 91.4 fL (ref 79.5–101.0)
MONO#: 0.7 10*3/uL (ref 0.1–0.9)
MONO%: 14.7 % — ABNORMAL HIGH (ref 0.0–14.0)
NEUT%: 46.9 % (ref 38.4–76.8)
NEUTROS ABS: 2.1 10*3/uL (ref 1.5–6.5)
PLATELETS: 205 10*3/uL (ref 145–400)
RBC: 4.17 10*6/uL (ref 3.70–5.45)
RDW: 13.6 % (ref 11.2–14.5)
WBC: 4.4 10*3/uL (ref 3.9–10.3)
lymph#: 1.3 10*3/uL (ref 0.9–3.3)

## 2016-10-31 NOTE — Telephone Encounter (Signed)
Appointments scheduled per 4.4.18 LOS. Patient given AVS report and calendars with future scheduled appointments.

## 2016-10-31 NOTE — Progress Notes (Signed)
[place] [phone]   Fax:([phone]) [place], [place] [address]  DIAGNOSIS: Metastatic intermediate. Neuroendocrine tumor of lung primary diagnosed in January 2018 and presented with small bilateral pulmonary nodules in addition to multiple liver metastasis.  PRIOR THERAPY: None  CURRENT THERAPY: Xeloda 750 MG/M2 twice a day days 1-14 and Temodar 150 MG/M2 days 10-14 every 4 weeks. Status post one cycle. She is expected to start cycle #2 on 11/09/2016.  INTERVAL HISTORY: [name] 83 y.o. female returns to the clinic today for follow-up visit accompanied by her husband. The patient started on treatment with Xeloda and Temodar 2 weeks ago and tolerated the treatment well. She denied having any significant nausea or vomiting. She has no fever or chills. She denied having any significant chest pain, shortness of breath, cough or hemoptysis. She denied having any significant weight loss or night sweats. She was seen 2 days ago at the emergency Department complaining of right upper quadrant abdominal pain and she had repeat CT scan of the abdomen to rule out kidney stone. The scan showed further increase in the size of the liver lesions. She is here today for evaluation and repeat blood work.  MEDICAL HISTORY: Past Medical History:  Diagnosis Date  . Acute respiratory failure with hypoxia ([place]) 10/23/2015  . Allergic rhinitis    PT. DENIES  . Anxiety   . Arthritis    NECK  . Ataxia   . Bradycardia    primarily nocturnal  . Burning tongue syndrome 25 years  . Cancer ([place])   . Cataract   . Cerebellar degeneration   . Chronic pericarditis   . Chronic urinary tract infection   . Complication of anesthesia    low o2 sats, coded 30 years ago  . CVA (cerebral infarction) 05/2003  . Depression   . Encounter for antineoplastic chemotherapy 10/09/2016  . Gait disorder   . Gastric polyps   . GERD  (gastroesophageal reflux disease)   . Goals of care, counseling/discussion 10/09/2016  . High cholesterol   . Hyperlipidemia   . Hypertension   . Hypertension   . Hypotension   . Hypothyroidism   . IBS (irritable bowel syndrome)   . Obesity   . Paroxysmal atrial fibrillation ([name])    chads2vasc score is 6,  she is felt to be a poor candidate for anticoagulation  . Personal history of arterial venous malformation (AVM)    right side of face  . Seizure disorder ([place])   . Seizures ([place]) 2003   " smelling"- Gabapentin "no problem"  . Shortness of breath dyspnea    with exertion  . Sternum fx 10/27/2013  . Stroke [facility]) 5 years ago   Right side of face weak, slurred speach-   . Thyroid disease   . TIA (transient ischemic attack)   . UTI (lower urinary tract infection) 03/27/2016   "frequently"    ALLERGIES:  is allergic to lisinopril.  MEDICATIONS:  Current Outpatient Prescriptions  Medication Sig Dispense Refill  . amitriptyline (ELAVIL) 25 MG tablet Take 1 tablet (25 mg total) by mouth at bedtime. 90 tablet 1  . escitalopram (LEXAPRO) 20 MG tablet Take 1 tablet (20 mg total) by mouth at bedtime. 90 tablet 1  . fluticasone (FLONASE) 50 MCG/ACT nasal spray Place 2 sprays into both nostrils at bedtime. 16 g 3  . furosemide (LASIX) 40 MG tablet Take 40 mg  by mouth daily as needed for edema.     . gabapentin (NEURONTIN) 300 MG capsule Take 1-2 capsules (300-600 mg total) by mouth 4 (four) times daily. 450 capsule 1  . hydroxypropyl methylcellulose / hypromellose (ISOPTO TEARS / GONIOVISC) 2.5 % ophthalmic solution Place 1 drop into both eyes at bedtime.    Marland Kitchen levothyroxine (SYNTHROID, LEVOTHROID) 75 MCG tablet TAKE 1 TABLET DAILY BEFORE BREAKFAST 90 tablet 0  . losartan (COZAAR) 50 MG tablet TAKE 1 TABLET DAILY 90 tablet 1  . lovastatin (MEVACOR) 40 MG tablet TAKE 1 TABLET AT BEDTIME 90 tablet 1  . Melatonin 3 MG TBDP Take 3-6 mg by mouth at bedtime as needed. (Patient taking  differently: Take 3-6 mg by mouth at bedtime as needed (for sleep). ) 60 tablet 1  . nitrofurantoin (MACRODANTIN) 50 MG capsule Take 50 mg by mouth at bedtime.    Marland Kitchen omeprazole (PRILOSEC) 40 MG capsule TAKE 1 CAPSULE DAILY 90 capsule 1  . oxyCODONE-acetaminophen (PERCOCET) 5-325 MG tablet Take 1-2 tablets by mouth every 4 (four) hours as needed. 30 tablet 0  . Vitamin D, Ergocalciferol, (DRISDOL) 50000 units CAPS capsule Take 1 capsule (50,000 Units total) by mouth every 7 (seven) days. (Patient taking differently: Take 50,000 Units by mouth every Friday. ) 12 capsule 1  . capecitabine (XELODA) 500 MG tablet Take 3 tablets (1,500 mg total) by mouth 2 (two) times daily after a meal. (Patient not taking: Reported on 10/31/2016) 84 tablet 2  . HYDROcodone-acetaminophen (NORCO/VICODIN) 5-325 MG tablet Take 1 tablet by mouth every 6 (six) hours as needed for moderate pain. (Patient not taking: Reported on 10/31/2016) 30 tablet 0  . ondansetron (ZOFRAN) 8 MG tablet Take 1 tablet (8 mg total) by mouth every 8 (eight) hours as needed for nausea or vomiting. (Patient not taking: Reported on 10/31/2016) 20 tablet 1  . temozolomide (TEMODAR) 140 MG capsule Take 2 capsules (280 mg total) by mouth daily. May take on an empty stomach or at bedtime to decrease nausea & vomiting. (Patient not taking: Reported on 10/31/2016) 10 capsule 2   No current facility-administered medications for this visit.     SURGICAL HISTORY:  Past Surgical History:  Procedure Laterality Date  . APPENDECTOMY  83 years old  . COLONOSCOPY  2006, 2009  . COLONOSCOPY WITH PROPOFOL N/A 03/28/2016   Procedure: COLONOSCOPY WITH PROPOFOL;  Surgeon: Gatha Mayer, MD;  Location: [facility];  Service: Endoscopy;  Laterality: N/A;  . cyst removed  35 years ago  . EP IMPLANTABLE DEVICE N/A 01/06/2015   Procedure: Loop Recorder Insertion;  Surgeon: Thompson Grayer, MD;  Location: [facility];  Service: Cardiovascular;  Laterality: N/A;  . EYE  SURGERY Right    Cataract  . KNEE ARTHROSCOPY Right 11/14/2006  . KNEE ARTHROSCOPY Bilateral 5 and 6 years ago  . KNEE ARTHROSCOPY WITH LATERAL MENISECTOMY  07/03/2012   Procedure: KNEE ARTHROSCOPY WITH LATERAL MENISECTOMY;  Surgeon: Magnus Sinning, MD;  Location: [facility];  Service: Orthopedics;  Laterality: Left;  with Partial Lateral Menisectomy and Medial Menisectomy. Shaving of medial and lateral femoral condyles. Shaving of patella. Removal of a loose body  . tibial and fibular internal fixation Left   . TOTAL ABDOMINAL HYSTERECTOMY  83 years old  . UPPER GASTROINTESTINAL ENDOSCOPY  2009, 2013    REVIEW OF SYSTEMS:  A comprehensive review of systems was negative except for: Constitutional: positive for fatigue Gastrointestinal: positive for abdominal pain   PHYSICAL EXAMINATION: General appearance: alert,  cooperative, fatigued and no distress Head: Normocephalic, without obvious abnormality, atraumatic Neck: no adenopathy, no JVD, supple, symmetrical, trachea midline and thyroid not enlarged, symmetric, no tenderness/mass/nodules Lymph nodes: Cervical, supraclavicular, and axillary nodes normal. Resp: clear to auscultation bilaterally Back: symmetric, no curvature. ROM normal. No CVA tenderness. Cardio: regular rate and rhythm, S1, S2 normal, no murmur, click, rub or gallop GI: soft, non-tender; bowel sounds normal; no masses,  no organomegaly Extremities: extremities normal, atraumatic, no cyanosis or edema  ECOG PERFORMANCE STATUS: 1 - Symptomatic but completely ambulatory  Blood pressure 108/72, pulse 70, temperature 97.7 F (36.5 C), temperature source Oral, resp. rate 16, weight 226 lb 8 oz (102.7 kg), SpO2 100 %.  LABORATORY DATA: Lab Results  Component Value Date   WBC 4.4 10/31/2016   HGB 12.6 10/31/2016   HCT 38.1 10/31/2016   MCV 91.4 10/31/2016   PLT 205 10/31/2016      Chemistry      Component Value Date/Time   NA 135 (L) 10/31/2016 1126   K 5.2 Repeated  and Verified (H) 10/31/2016 1126   CL 100 (L) 10/29/2016 1222   CO2 27 10/31/2016 1126   BUN 9.2 10/31/2016 1126   CREATININE 0.9 10/31/2016 1126      Component Value Date/Time   CALCIUM 9.8 10/31/2016 1126   ALKPHOS 81 10/31/2016 1126   AST 28 10/31/2016 1126   ALT 24 10/31/2016 1126   BILITOT 0.39 10/31/2016 1126       RADIOGRAPHIC STUDIES: Nm Pet (netspot Ga 68 Dotatate) Skull Base To Mid Thigh  Result Date: 10/02/2016 CLINICAL DATA:  Liver metastasis. Neuroendocrine tumor of the liver on biopsy EXAM: NUCLEAR MEDICINE PET SKULL BASE TO THIGH TECHNIQUE: 5.4 mCi Ga 68 DOTATATE was injected intravenously. Full-ring PET imaging was performed from the skull base to thigh after the radiotracer. CT data was obtained and used for attenuation correction and anatomic localization. COMPARISON:  CT chest 07/14/2016,  CT abdomen 07/13/2016. FINDINGS: NECK No radiotracer activity in neck lymph nodes. CHEST Bilateral pulmonary nodules again demonstrated. RIGHT upper lobe nodule measuring 10 mm (image 50, series 4) has faint radiotracer activity. Other smaller pulmonary nodules in the LEFT lung do not have a clear radiotracer activity. There is faint radiotracer activity within small axillary lymph nodes which are similar to background mediastinal activity. ABDOMEN/PELVIS Rounded lesion in the RIGHT hepatic lobe measures 2.9 cm (image 77, series 4) does not have associated radiotracer activity above background liver activity. No discrete focal activity in the liver above background. Similar small lesion in the LEFT hepatic lobe measuring 14 mm without associated radiotracer activity. Within the tail of the pancreas there is a focus of radiotracer activity above the background pancreatic tissue activity with SUV max equal 12.4. Subtle increase in density measuring 12 mm on image 87, series 4. There is activity seen within the uncinate of the pancreas which is a typical physiologic. Physiologic activity noted  within the small bowel. No focal lesion within the mesenteries. Physiologic activity noted adrenal glands and kidneys as well as spleen. SKELETON No focal  activity to suggest skeletal metastasis. IMPRESSION: 1. No neuroendocrine tumor specific radiotracer accumulation within the hepatic lesions indicates a pain neuroendocrine tumor on the more poorly differentiated spectrum. 2. Focus of discrete radiotracer accumulation within tail the pancreas. Recommend MRI pancreatic protocol without and with contrast to evaluate for a pancreatic tail primary. 3. Very low radiotracer activity associated the RIGHT upper lobe pulmonary nodule and no activity associated smaller LEFT nodules. Lesions remain  indeterminate but suspicious for poorly differentiated neuroendocrine tumor. Electronically Signed   By: Suzy Bouchard M.D.   On: 10/02/2016 09:57   Ct Renal Stone Study  Result Date: 10/29/2016 CLINICAL DATA:  Right groin pain for 1 week. EXAM: CT ABDOMEN AND PELVIS WITHOUT CONTRAST TECHNIQUE: Multidetector CT imaging of the abdomen and pelvis was performed following the standard protocol without IV contrast. COMPARISON:  CT scan of July 13, 2016. FINDINGS: Lower chest: No acute abnormality. Hepatobiliary: 3.2 cm right hepatic lesion is noted consistent with metastatic disease. 2.2 cm left hepatic lesion is noted which is significantly enlarged compared to prior exam and also consistent with metastatic disease. No gallstones are noted. Pancreas: Unremarkable. No pancreatic ductal dilatation or surrounding inflammatory changes. Spleen: Normal in size without focal abnormality. Adrenals/Urinary Tract: Adrenal glands appear normal. Small nonobstructive left renal calculus is noted. No hydronephrosis or renal obstruction is noted. Urinary bladder is unremarkable. Stomach/Bowel: There is no evidence of bowel obstruction. Vascular/Lymphatic: Aortic atherosclerosis. No enlarged abdominal or pelvic lymph nodes. Reproductive:  Status post hysterectomy. No adnexal masses. Other: No abdominal wall hernia or abnormality. No abdominopelvic ascites. Musculoskeletal: No acute or significant osseous findings. IMPRESSION: Worsening hepatic metastatic disease as described above. Nonobstructive left renal calculus. No hydronephrosis or renal obstruction is noted. Aortic atherosclerosis. Electronically Signed   By: Marijo Conception, M.D.   On: 10/29/2016 13:48    ASSESSMENT AND PLAN:  This is a very pleasant 83 years old white female with metastatic intermediate grade neuroendocrine carcinoma of questionable lung primary with multiple metastatic liver lesions as well as lesion is the pancreas. She is currently undergoing treatment with Xeloda and Temodar status post 1 cycle. She tolerated the first cycle of her treatment well with no significant adverse effects. Her recent CT scan which was performed only a few days while she is on treatment showed further increase in the liver lesion which is not taking good indication for her current treatment taking him to consider patient the short duration of her treatment. I recommended for the patient to continue her current treatment with the same regimen and she is expected to start cycle #2 on 11/09/2016. I also discussed with the patient consideration of referral to interventional radiology for discussion of Y 90 treatment for the liver lesions. I will see her back for follow-up visit in 2 weeks for reevaluation and repeat blood work for close monitoring of her treatment. The patient was advised to call immediately if she has any concerning symptoms in the interval. The patient voices understanding of current disease status and treatment options and is in agreement with the current care plan. All questions were answered. The patient knows to call the clinic with any problems, questions or concerns. We can certainly see the patient much sooner if necessary. I spent 10 minutes counseling the  patient face to face. The total time spent in the appointment was 15 minutes.  Disclaimer: This note was dictated with voice recognition software. Similar sounding words can inadvertently be transcribed and may not be corrected upon review.

## 2016-11-06 ENCOUNTER — Ambulatory Visit
Admission: RE | Admit: 2016-11-06 | Discharge: 2016-11-06 | Disposition: A | Discharge status: Home / Self Care | Payer: Medicare Other | Source: Ambulatory Visit | Attending: Internal Medicine | Admitting: Internal Medicine

## 2016-11-06 ENCOUNTER — Other Ambulatory Visit (HOSPITAL_COMMUNITY): Payer: Self-pay | Admitting: Interventional Radiology

## 2016-11-06 ENCOUNTER — Other Ambulatory Visit: Payer: Self-pay | Admitting: Internal Medicine

## 2016-11-06 DIAGNOSIS — C7B8 Other secondary neuroendocrine tumors: Secondary | ICD-10-CM

## 2016-11-06 DIAGNOSIS — Z5111 Encounter for antineoplastic chemotherapy: Secondary | ICD-10-CM

## 2016-11-06 DIAGNOSIS — C787 Secondary malignant neoplasm of liver and intrahepatic bile duct: Secondary | ICD-10-CM | POA: Diagnosis not present

## 2016-11-06 DIAGNOSIS — C7A8 Other malignant neuroendocrine tumors: Secondary | ICD-10-CM

## 2016-11-06 HISTORY — PX: IR RADIOLOGIST EVAL & MGMT: IMG5224

## 2016-11-06 NOTE — Consult Note (Signed)
Chief Complaint  Patient presents with  . Advice Only    Consult for Y-90 SIRT   at the request of [facility]  Referring Physician(s): Mohamed,Mohamed  History of Present Illness: [name] is a 83 y.o. female with complex past medical history significant for bradycardia, paroxysmal atrial fibrillation, chronic pericarditis, CVA and cerebellar degeneration (patient is wheelchair bound and requires assistance for transferring), hypertension and hyperlipidemia, who has recently been diagnosed with metastatic neuroendocrine tumor of the lung following ultrasound-guided liver lesion biopsy on 08/20/2016.  Patiently initially started on treatment with alternating two-week cycles of Xeloda and Temodar and while she has been tolerating the treatment well, CT scan of the abdomen and pelvis performed for the evaluation of right groin pain demonstrates progression of known hepatic metastatic disease and as such, patient referred for evaluation of candidacy for hepatic directed therapy at the request of Dr. Earlie Server. The patient is accompanied by her husband though serves as her own historian.  Patient is in her baseline state of poor health. No unintentional weight loss or weight gain. No abdominal pain. As above, the patient is currently tolerating her chemotherapy regimen well without incident. She is due to begin another round of two-week cycle of chemotherapy later this week.  Patient is interested in pursuing all treatment options.  Past Medical History:  Diagnosis Date  . Acute respiratory failure with hypoxia ([place]) 10/23/2015  . Allergic rhinitis    PT. DENIES  . Anxiety   . Arthritis    NECK  . Ataxia   . Bradycardia    primarily nocturnal  . Burning tongue syndrome 25 years  . Cancer ([place])   . Cataract   . Cerebellar degeneration   . Chronic pericarditis   . Chronic urinary tract infection   . Complication of anesthesia    low o2 sats, coded 30 years ago  . CVA  (cerebral infarction) 05/2003  . Depression   . Encounter for antineoplastic chemotherapy 10/09/2016  . Gait disorder   . Gastric polyps   . GERD (gastroesophageal reflux disease)   . Goals of care, counseling/discussion 10/09/2016  . High cholesterol   . Hyperlipidemia   . Hypertension   . Hypertension   . Hypotension   . Hypothyroidism   . IBS (irritable bowel syndrome)   . Obesity   . Paroxysmal atrial fibrillation ([name])    chads2vasc score is 6,  she is felt to be a poor candidate for anticoagulation  . Personal history of arterial venous malformation (AVM)    right side of face  . Seizure disorder ([place])   . Seizures ([place]) 2003   " smelling"- Gabapentin "no problem"  . Shortness of breath dyspnea    with exertion  . Sternum fx 10/27/2013  . Stroke [facility]) 5 years ago   Right side of face weak, slurred speach-   . Thyroid disease   . TIA (transient ischemic attack)   . UTI (lower urinary tract infection) 03/27/2016   "frequently"    Past Surgical History:  Procedure Laterality Date  . APPENDECTOMY  83 years old  . COLONOSCOPY  2006, 2009  . COLONOSCOPY WITH PROPOFOL N/A 03/28/2016   Procedure: COLONOSCOPY WITH PROPOFOL;  Surgeon: Gatha Mayer, MD;  Location: [place];  Service: Endoscopy;  Laterality: N/A;  . cyst removed  35 years ago  . EP IMPLANTABLE DEVICE N/A 01/06/2015   Procedure: Loop Recorder Insertion;  Surgeon: Thompson Grayer, MD;  Location: [place] [facility];  Service: Cardiovascular;  Laterality: N/A;  . EYE SURGERY Right    Cataract  . KNEE ARTHROSCOPY Right 11/14/2006  . KNEE ARTHROSCOPY Bilateral 5 and 6 years ago  . KNEE ARTHROSCOPY WITH LATERAL MENISECTOMY  07/03/2012   Procedure: KNEE ARTHROSCOPY WITH LATERAL MENISECTOMY;  Surgeon: Magnus Sinning, MD;  Location: [facility];  Service: Orthopedics;  Laterality: Left;  with Partial Lateral Menisectomy and Medial Menisectomy. Shaving of medial and lateral femoral condyles. Shaving of patella. Removal of a  loose body  . tibial and fibular internal fixation Left   . TOTAL ABDOMINAL HYSTERECTOMY  83 years old  . UPPER GASTROINTESTINAL ENDOSCOPY  2009, 2013    Allergies: Lisinopril  Medications: Prior to Admission medications   Medication Sig Start Date End Date Taking? Authorizing Provider  amitriptyline (ELAVIL) 25 MG tablet Take 1 tablet (25 mg total) by mouth at bedtime. 10/01/16  Yes Eustaquio Maize, MD  capecitabine (XELODA) 500 MG tablet Take 3 tablets (1,500 mg total) by mouth 2 (two) times daily after a meal. 10/09/16  Yes Curt Bears, MD  escitalopram (LEXAPRO) 20 MG tablet Take 1 tablet (20 mg total) by mouth at bedtime. 10/01/16  Yes Eustaquio Maize, MD  fluticasone (FLONASE) 50 MCG/ACT nasal spray Place 2 sprays into both nostrils at bedtime. 09/20/16  Yes Eustaquio Maize, MD  furosemide (LASIX) 40 MG tablet Take 40 mg by mouth daily as needed for edema.    Yes Historical Provider, MD  gabapentin (NEURONTIN) 300 MG capsule Take 1-2 capsules (300-600 mg total) by mouth 4 (four) times daily. 08/23/16  Yes Eustaquio Maize, MD  hydroxypropyl methylcellulose / hypromellose (ISOPTO TEARS / GONIOVISC) 2.5 % ophthalmic solution Place 1 drop into both eyes at bedtime.   Yes Historical Provider, MD  levothyroxine (SYNTHROID, LEVOTHROID) 75 MCG tablet TAKE 1 TABLET DAILY BEFORE BREAKFAST 09/25/16  Yes Sharion Balloon, FNP  losartan (COZAAR) 50 MG tablet TAKE 1 TABLET DAILY 07/17/16  Yes Sharion Balloon, FNP  lovastatin (MEVACOR) 40 MG tablet TAKE 1 TABLET AT BEDTIME 10/25/16  Yes Eustaquio Maize, MD  Melatonin 3 MG TBDP Take 3-6 mg by mouth at bedtime as needed. Patient taking differently: Take 3-6 mg by mouth at bedtime as needed (for sleep).  08/02/16  Yes Eustaquio Maize, MD  nitrofurantoin (MACRODANTIN) 50 MG capsule Take 50 mg by mouth at bedtime.   Yes Historical Provider, MD  omeprazole (PRILOSEC) 40 MG capsule TAKE 1 CAPSULE DAILY 10/25/16  Yes Eustaquio Maize, MD  ondansetron (ZOFRAN) 8 MG  tablet Take 1 tablet (8 mg total) by mouth every 8 (eight) hours as needed for nausea or vomiting. 10/09/16  Yes Curt Bears, MD  temozolomide (TEMODAR) 140 MG capsule Take 2 capsules (280 mg total) by mouth daily. May take on an empty stomach or at bedtime to decrease nausea & vomiting. 10/09/16  Yes Curt Bears, MD  Vitamin D, Ergocalciferol, (DRISDOL) 50000 units CAPS capsule Take 1 capsule (50,000 Units total) by mouth every 7 (seven) days. Patient taking differently: Take 50,000 Units by mouth every Friday.  08/23/16  Yes Eustaquio Maize, MD  HYDROcodone-acetaminophen (NORCO/VICODIN) 5-325 MG tablet Take 1 tablet by mouth every 6 (six) hours as needed for moderate pain. Patient not taking: Reported on 10/31/2016 10/26/16   Wyatt Portela, MD  oxyCODONE-acetaminophen (PERCOCET) 5-325 MG tablet Take 1-2 tablets by mouth every 4 (four) hours as needed. Patient not taking: Reported on 11/06/2016 10/29/16   Milton Ferguson, MD  Family History  Problem Relation Age of Onset  . Heart attack Father 62    fatal  . Coronary artery disease Brother   . Diabetes Brother   . Prostate cancer Brother   . Prostate cancer Son     Social History   Social History  . Marital status: Married    Spouse name: N/A  . Number of children: N/A  . Years of education: N/A   Social History Main Topics  . Smoking status: Former Smoker    Packs/day: 0.25    Years: 10.00    Types: Cigarettes    Quit date: 07/31/1975  . Smokeless tobacco: Never Used  . Alcohol use No     Comment: Rare- maybe a drink ever 2 years  . Drug use: No  . Sexual activity: Not on file   Other Topics Concern  . Not on file   Social History Narrative   Lives with husband, does have stairs, does not use them. Pt completed 10th grade.    ECOG Status: 3 - Symptomatic, >50% confined to bed  Review of Systems: A 12 point ROS discussed and pertinent positives are indicated in the HPI above.  All other systems are  negative.  Review of Systems  Constitutional: Negative for activity change and unexpected weight change.  Respiratory: Negative.   Cardiovascular: Negative.   Gastrointestinal: Negative.     Vital Signs: BP (!) 144/80 (BP Location: Left Arm, Patient Position: Sitting, Cuff Size: Large)   Pulse 72   Temp 97.8 F (36.6 C) (Oral)   Resp 14   Ht '5\' 4"'$  (1.626 m)   Wt 226 lb (102.5 kg)   SpO2 96%   BMI 38.79 kg/m   Physical Exam  Constitutional: She appears well-developed and well-nourished.  Cardiovascular: Normal rate, regular rhythm and intact distal pulses.   Easily palpable right dorsalis pedis arterial pulses. I am unable to palpate the right posterior tibial arterial pulse.  Pulmonary/Chest: Effort normal and breath sounds normal.  Abdominal: Soft. Bowel sounds are normal.  Skin: Skin is warm and dry.  Nursing note and vitals reviewed.   Imaging:  Selected images from CT scan of the abdomen and pelvis performed 07/13/2016; 10/30/2014; PET/CT with neuroendocrine radiotracer - 10/02/2016 and ultrasound guided liver lesion biopsy - 08/20/2016 were reviewed in detail.   Ct Renal Stone Study  Result Date: 10/29/2016 CLINICAL DATA:  Right groin pain for 1 week. EXAM: CT ABDOMEN AND PELVIS WITHOUT CONTRAST TECHNIQUE: Multidetector CT imaging of the abdomen and pelvis was performed following the standard protocol without IV contrast. COMPARISON:  CT scan of July 13, 2016. FINDINGS: Lower chest: No acute abnormality. Hepatobiliary: 3.2 cm right hepatic lesion is noted consistent with metastatic disease. 2.2 cm left hepatic lesion is noted which is significantly enlarged compared to prior exam and also consistent with metastatic disease. No gallstones are noted. Pancreas: Unremarkable. No pancreatic ductal dilatation or surrounding inflammatory changes. Spleen: Normal in size without focal abnormality. Adrenals/Urinary Tract: Adrenal glands appear normal. Small nonobstructive left renal  calculus is noted. No hydronephrosis or renal obstruction is noted. Urinary bladder is unremarkable. Stomach/Bowel: There is no evidence of bowel obstruction. Vascular/Lymphatic: Aortic atherosclerosis. No enlarged abdominal or pelvic lymph nodes. Reproductive: Status post hysterectomy. No adnexal masses. Other: No abdominal wall hernia or abnormality. No abdominopelvic ascites. Musculoskeletal: No acute or significant osseous findings. IMPRESSION: Worsening hepatic metastatic disease as described above. Nonobstructive left renal calculus. No hydronephrosis or renal obstruction is noted. Aortic atherosclerosis. Electronically Signed  By: Marijo Conception, M.D.   On: 10/29/2016 13:48    Labs:  CBC:  Recent Labs  07/16/16 0752 08/02/16 1534 08/20/16 1244 10/29/16 1222 10/31/16 1126  WBC 11.9* 6.5 5.2 5.1 4.4  HGB 11.7*  --  13.4 13.2 12.6  HCT 35.6* 42.2 41.9 40.3 38.1  PLT 184 334 207 213 205    COAGS:  Recent Labs  08/20/16 1244  INR 1.07  APTT 27    BMP:  Recent Labs  07/14/16 0600 07/16/16 0752 08/02/16 1534 10/29/16 1222 10/31/16 1126  NA 135 135 137 135 135*  K 3.7 3.6 4.6 4.6 5.2 Repeated and Verified*  CL 102 105 96 100*  --   CO2 '25 24 25 29 27  '$ GLUCOSE 137* 148* 103* 119* 109  BUN '16 13 11 13 '$ 9.2  CALCIUM 9.0 8.8* 10.1 9.7 9.8  CREATININE 0.98 0.84 0.84 0.98 0.9  GFRNONAA 53* >60 66 53*  --   GFRAA >60 >60 76 >60  --     LIVER FUNCTION TESTS:  Recent Labs  04/12/16 1455 07/14/16 0600 10/29/16 1222 10/31/16 1126  BILITOT 0.4 0.5 0.5 0.39  AST 16 47* 32 28  ALT '9 18 27 24  '$ ALKPHOS 64 67 74 81  PROT 6.4 6.0* 6.9 6.5  ALBUMIN 3.9 3.2* 3.7 3.6    TUMOR MARKERS: No results for input(s): AFPTM, CEA, CA199, CHROMGRNA in the last 8760 hours.  Assessment and Plan:  [name] is a 83 y.o. female with complex past medical history significant for bradycardia, paroxysmal atrial fibrillation, chronic pericarditis, CVA and cerebellar degeneration  (patient is wheelchair bound and requires assistance for transferring), hypertension and hyperlipidemia, who has recently been diagnosed with metastatic neuroendocrine tumor of the lung following ultrasound-guided liver lesion biopsy on 08/20/2016.  Patiently initially started on treatment with alternating two-week cycles of Xeloda and Temodar and while she has been tolerating the treatment well, CT scan of the abdomen and pelvis performed 10/29/2016 demonstrates progression of known hepatic metastatic disease and as such, patient referred for evaluation of candidacy for hepatic directed therapy.  Patient is in her baseline state of poor health and is chronically wheelchair bound due to cerebellar degeneration.  Potential treatment options regarding her hepatic-centric metastatic disease was discussed in detail with the patient. Given the patient's multiple medical co-morbidities, I do not feel the patient is a surgical resection candidate, nor do I think the patient is a good ablation candidate..  As such, prolonged conversations were held with the patient regarding the benefits and risks (including but not limited to contrast and radiation exposure, nontarget embolization, worsening hepatic function and post residual pain) of Y 90 radial embolization.  I explained that transcatheter treatment options are performed in the palliative setting however I am hopeful that radial embolization could result in disease stability to improvement for the next 6-9 months.  I explained that as the patient's liver functions are currently normal (bilirubin obtained on 4/4 was 0.4), she would be a candidate for this palliative procedure.    I explained that a safety/mapping arteriogram would first be performed to map and potentially embolize nontarget hepatic vasculature. I explained that as she has bilobar disease, definitive radioembolization would be performed on separate locations targeting both the right and left lobes  of the live liver with embolizations spaced approximately 4-6 weeks apart.  Following this prolonged and detailed conversation, the patient wishes to pursue Y 48 radioembolization. As such, we will begin the necessary paperwork for  insurance approval. Additionally, we will obtain a contrast enhanced CTA of the abdomen and pelvis to evaluate her hepatic arterial supply as well as as to better delineate her hepatic disease burden.  Ultimately the Y 90 embolization procedures will be performed at [facility].  All procedures will be performed at outpatient basis.  The patient and her husband were encouraged to call the interventional radiology clinic with any interval questions or concerns.  Thank you for this interesting consult.  I greatly enjoyed meeting [name] and look forward to participating in their care.  A copy of this report was sent to the requesting provider on this date.  Electronically Signed: Sandi Mariscal 11/06/2016, 4:49 PM   I spent a total of 40 Minutes in face to face in clinical consultation, greater than 50% of which was counseling/coordinating care for Metastatic lung cancer

## 2016-11-07 ENCOUNTER — Other Ambulatory Visit: Payer: Self-pay | Admitting: Internal Medicine

## 2016-11-07 ENCOUNTER — Other Ambulatory Visit (HOSPITAL_COMMUNITY): Payer: Self-pay | Admitting: Interventional Radiology

## 2016-11-07 DIAGNOSIS — C7B8 Other secondary neuroendocrine tumors: Secondary | ICD-10-CM

## 2016-11-07 DIAGNOSIS — C787 Secondary malignant neoplasm of liver and intrahepatic bile duct: Secondary | ICD-10-CM

## 2016-11-07 DIAGNOSIS — Z5111 Encounter for antineoplastic chemotherapy: Secondary | ICD-10-CM

## 2016-11-07 DIAGNOSIS — C7A8 Other malignant neuroendocrine tumors: Secondary | ICD-10-CM

## 2016-11-08 NOTE — Telephone Encounter (Signed)
Fixed several weeks ago

## 2016-11-13 ENCOUNTER — Ambulatory Visit (HOSPITAL_COMMUNITY)
Admission: RE | Admit: 2016-11-13 | Discharge: 2016-11-13 | Disposition: A | Discharge status: Home / Self Care | Payer: Medicare Other | Source: Ambulatory Visit | Attending: Interventional Radiology | Admitting: Interventional Radiology

## 2016-11-13 DIAGNOSIS — C787 Secondary malignant neoplasm of liver and intrahepatic bile duct: Secondary | ICD-10-CM | POA: Diagnosis present

## 2016-11-13 DIAGNOSIS — C7A8 Other malignant neuroendocrine tumors: Secondary | ICD-10-CM | POA: Insufficient documentation

## 2016-11-13 DIAGNOSIS — Z5111 Encounter for antineoplastic chemotherapy: Secondary | ICD-10-CM

## 2016-11-13 DIAGNOSIS — N200 Calculus of kidney: Secondary | ICD-10-CM | POA: Insufficient documentation

## 2016-11-13 DIAGNOSIS — R911 Solitary pulmonary nodule: Secondary | ICD-10-CM | POA: Insufficient documentation

## 2016-11-13 DIAGNOSIS — C801 Malignant (primary) neoplasm, unspecified: Secondary | ICD-10-CM | POA: Diagnosis not present

## 2016-11-13 MED ORDER — IOPAMIDOL (ISOVUE-370) INJECTION 76%
INTRAVENOUS | Status: AC
  Administered 2016-11-13: 100 mL
  Filled 2016-11-13: qty 100

## 2016-11-14 ENCOUNTER — Other Ambulatory Visit (HOSPITAL_COMMUNITY): Payer: Self-pay | Admitting: Interventional Radiology

## 2016-11-14 DIAGNOSIS — C7B8 Other secondary neuroendocrine tumors: Secondary | ICD-10-CM

## 2016-11-15 ENCOUNTER — Telehealth: Payer: Self-pay | Admitting: Cardiology

## 2016-11-15 NOTE — Telephone Encounter (Signed)
Spoke w/ pt and requested that she send a manual transmission b/c her home monitor has not updated in at least 14 days.   

## 2016-11-20 ENCOUNTER — Ambulatory Visit (HOSPITAL_BASED_OUTPATIENT_CLINIC_OR_DEPARTMENT_OTHER): Payer: Medicare Other | Admitting: Internal Medicine

## 2016-11-20 ENCOUNTER — Other Ambulatory Visit (HOSPITAL_BASED_OUTPATIENT_CLINIC_OR_DEPARTMENT_OTHER): Payer: Medicare Other

## 2016-11-20 ENCOUNTER — Encounter: Payer: Self-pay | Admitting: Internal Medicine

## 2016-11-20 VITALS — BP 125/57 | HR 86 | Temp 98.3°F | Resp 17 | Ht 64.0 in | Wt 225.5 lb

## 2016-11-20 DIAGNOSIS — C7B8 Other secondary neuroendocrine tumors: Secondary | ICD-10-CM

## 2016-11-20 DIAGNOSIS — Z5111 Encounter for antineoplastic chemotherapy: Secondary | ICD-10-CM

## 2016-11-20 DIAGNOSIS — C787 Secondary malignant neoplasm of liver and intrahepatic bile duct: Secondary | ICD-10-CM

## 2016-11-20 DIAGNOSIS — C7A8 Other malignant neuroendocrine tumors: Secondary | ICD-10-CM | POA: Diagnosis not present

## 2016-11-20 LAB — CBC WITH DIFFERENTIAL/PLATELET
BASO%: 0.4 % (ref 0.0–2.0)
Basophils Absolute: 0 10*3/uL (ref 0.0–0.1)
EOS%: 3.1 % (ref 0.0–7.0)
Eosinophils Absolute: 0.2 10*3/uL (ref 0.0–0.5)
HCT: 41.6 % (ref 34.8–46.6)
HGB: 13.3 g/dL (ref 11.6–15.9)
LYMPH%: 27 % (ref 14.0–49.7)
MCH: 29.8 pg (ref 25.1–34.0)
MCHC: 32 g/dL (ref 31.5–36.0)
MCV: 93.3 fL (ref 79.5–101.0)
MONO#: 0.6 10*3/uL (ref 0.1–0.9)
MONO%: 10.9 % (ref 0.0–14.0)
NEUT#: 3.1 10*3/uL (ref 1.5–6.5)
NEUT%: 58.6 % (ref 38.4–76.8)
Platelets: 176 10*3/uL (ref 145–400)
RBC: 4.46 10*6/uL (ref 3.70–5.45)
RDW: 15.8 % — ABNORMAL HIGH (ref 11.2–14.5)
WBC: 5.2 10*3/uL (ref 3.9–10.3)
lymph#: 1.4 10*3/uL (ref 0.9–3.3)

## 2016-11-20 LAB — COMPREHENSIVE METABOLIC PANEL
ALT: 15 U/L (ref 0–55)
ANION GAP: 10 meq/L (ref 3–11)
AST: 20 U/L (ref 5–34)
Albumin: 3.9 g/dL (ref 3.5–5.0)
Alkaline Phosphatase: 86 U/L (ref 40–150)
BUN: 10.7 mg/dL (ref 7.0–26.0)
CHLORIDE: 101 meq/L (ref 98–109)
CO2: 26 mEq/L (ref 22–29)
CREATININE: 0.9 mg/dL (ref 0.6–1.1)
Calcium: 10.4 mg/dL (ref 8.4–10.4)
EGFR: 59 mL/min/{1.73_m2} — AB (ref >90)
Glucose: 114 mg/dl (ref 70–140)
Potassium: 4.4 mEq/L (ref 3.5–5.1)
Sodium: 137 mEq/L (ref 136–145)
Total Bilirubin: 0.56 mg/dL (ref 0.20–1.20)
Total Protein: 7.3 g/dL (ref 6.4–8.3)

## 2016-11-20 LAB — LACTATE DEHYDROGENASE: LDH: 157 U/L (ref 125–245)

## 2016-11-20 NOTE — Progress Notes (Signed)
[place] [phone]   Fax:(336) [place], [place] [address]  DIAGNOSIS: Metastatic intermediate. Neuroendocrine tumor of lung primary diagnosed in January 2018 and presented with small bilateral pulmonary nodules in addition to multiple liver metastasis.  PRIOR THERAPY: None  CURRENT THERAPY: Xeloda 750 MG/M2 twice a day days 1-14 and Temodar 150 MG/M2 days 10-14 every 4 weeks. Status post one cycle. She started cycle #2 on 11/09/2016.  INTERVAL HISTORY: [name] 83 y.o. female returns to the clinic today for follow-up visit accompanied by her husband. The patient is feeling fine today with no specific complaints. She continues to tolerate her treatment with Xeloda and Temodar fairly well. She is status post 1 cycle and currently on cycle #2. She was referred to interventional radiology for consideration of Y90 treatment and she is considered a good candidate for this treatment. She is scheduled for the first treatment on 11/28/2016. She denied having any chest pain, shortness of breath, cough or hemoptysis. She denied having any fever or chills. She has no nausea, vomiting, diarrhea or constipation. She is here today for evaluation and repeat blood work.  MEDICAL HISTORY: Past Medical History:  Diagnosis Date  . Acute respiratory failure with hypoxia ([place]) 10/23/2015  . Allergic rhinitis    PT. DENIES  . Anxiety   . Arthritis    NECK  . Ataxia   . Bradycardia    primarily nocturnal  . Burning tongue syndrome 25 years  . Cancer ([place])   . Cataract   . Cerebellar degeneration   . Chronic pericarditis   . Chronic urinary tract infection   . Complication of anesthesia    low o2 sats, coded 30 years ago  . CVA (cerebral infarction) 05/2003  . Depression   . Encounter for antineoplastic chemotherapy 10/09/2016  . Gait disorder   . Gastric polyps   . GERD (gastroesophageal reflux  disease)   . Goals of care, counseling/discussion 10/09/2016  . High cholesterol   . Hyperlipidemia   . Hypertension   . Hypertension   . Hypotension   . Hypothyroidism   . IBS (irritable bowel syndrome)   . Obesity   . Paroxysmal atrial fibrillation ([name])    chads2vasc score is 6,  she is felt to be a poor candidate for anticoagulation  . Personal history of arterial venous malformation (AVM)    right side of face  . Seizure disorder ([place])   . Seizures ([place]) 2003   " smelling"- Gabapentin "no problem"  . Shortness of breath dyspnea    with exertion  . Sternum fx 10/27/2013  . Stroke [facility]) 5 years ago   Right side of face weak, slurred speach-   . Thyroid disease   . TIA (transient ischemic attack)   . UTI (lower urinary tract infection) 03/27/2016   "frequently"    ALLERGIES:  is allergic to lisinopril.  MEDICATIONS:  Current Outpatient Prescriptions  Medication Sig Dispense Refill  . amitriptyline (ELAVIL) 25 MG tablet Take 1 tablet (25 mg total) by mouth at bedtime. 90 tablet 1  . capecitabine (XELODA) 500 MG tablet Take 3 tablets (1,500 mg total) by mouth 2 (two) times daily after a meal. 84 tablet 2  . escitalopram (LEXAPRO) 20 MG tablet Take 1 tablet (20 mg total) by mouth at bedtime. 90 tablet 1  . fluticasone (FLONASE) 50 MCG/ACT nasal spray Place 2 sprays into both nostrils  at bedtime. 16 g 3  . furosemide (LASIX) 40 MG tablet Take 40 mg by mouth daily as needed for edema.     . gabapentin (NEURONTIN) 300 MG capsule Take 1-2 capsules (300-600 mg total) by mouth 4 (four) times daily. 450 capsule 1  . hydroxypropyl methylcellulose / hypromellose (ISOPTO TEARS / GONIOVISC) 2.5 % ophthalmic solution Place 1 drop into both eyes at bedtime.    Marland Kitchen levothyroxine (SYNTHROID, LEVOTHROID) 75 MCG tablet TAKE 1 TABLET DAILY BEFORE BREAKFAST 90 tablet 0  . losartan (COZAAR) 50 MG tablet TAKE 1 TABLET DAILY 90 tablet 1  . lovastatin (MEVACOR) 40 MG tablet TAKE 1 TABLET AT BEDTIME 90  tablet 1  . Melatonin 3 MG TBDP Take 3-6 mg by mouth at bedtime as needed. (Patient taking differently: Take 3-6 mg by mouth at bedtime as needed (for sleep). ) 60 tablet 1  . nitrofurantoin (MACRODANTIN) 50 MG capsule Take 50 mg by mouth at bedtime.    Marland Kitchen omeprazole (PRILOSEC) 40 MG capsule TAKE 1 CAPSULE DAILY 90 capsule 1  . ondansetron (ZOFRAN) 8 MG tablet Take 1 tablet (8 mg total) by mouth every 8 (eight) hours as needed for nausea or vomiting. 20 tablet 1  . temozolomide (TEMODAR) 140 MG capsule Take 2 capsules (280 mg total) by mouth daily. May take on an empty stomach or at bedtime to decrease nausea & vomiting. 10 capsule 2  . Vitamin D, Ergocalciferol, (DRISDOL) 50000 units CAPS capsule Take 1 capsule (50,000 Units total) by mouth every 7 (seven) days. (Patient taking differently: Take 50,000 Units by mouth every Friday. ) 12 capsule 1  . HYDROcodone-acetaminophen (NORCO/VICODIN) 5-325 MG tablet Take 1 tablet by mouth every 6 (six) hours as needed for moderate pain. (Patient not taking: Reported on 10/31/2016) 30 tablet 0  . oxyCODONE-acetaminophen (PERCOCET) 5-325 MG tablet Take 1-2 tablets by mouth every 4 (four) hours as needed. (Patient not taking: Reported on 11/06/2016) 30 tablet 0   No current facility-administered medications for this visit.     SURGICAL HISTORY:  Past Surgical History:  Procedure Laterality Date  . APPENDECTOMY  83 years old  . COLONOSCOPY  2006, 2009  . COLONOSCOPY WITH PROPOFOL N/A 03/28/2016   Procedure: COLONOSCOPY WITH PROPOFOL;  Surgeon: Gatha Mayer, MD;  Location: [facility];  Service: Endoscopy;  Laterality: N/A;  . cyst removed  35 years ago  . EP IMPLANTABLE DEVICE N/A 01/06/2015   Procedure: Loop Recorder Insertion;  Surgeon: Thompson Grayer, MD;  Location: [facility];  Service: Cardiovascular;  Laterality: N/A;  . EYE SURGERY Right    Cataract  . KNEE ARTHROSCOPY Right 11/14/2006  . KNEE ARTHROSCOPY Bilateral 5 and 6 years ago  . KNEE  ARTHROSCOPY WITH LATERAL MENISECTOMY  07/03/2012   Procedure: KNEE ARTHROSCOPY WITH LATERAL MENISECTOMY;  Surgeon: Magnus Sinning, MD;  Location: [facility];  Service: Orthopedics;  Laterality: Left;  with Partial Lateral Menisectomy and Medial Menisectomy. Shaving of medial and lateral femoral condyles. Shaving of patella. Removal of a loose body  . tibial and fibular internal fixation Left   . TOTAL ABDOMINAL HYSTERECTOMY  83 years old  . UPPER GASTROINTESTINAL ENDOSCOPY  2009, 2013    REVIEW OF SYSTEMS:  A comprehensive review of systems was negative.   PHYSICAL EXAMINATION: General appearance: alert, cooperative, fatigued and no distress Head: Normocephalic, without obvious abnormality, atraumatic Neck: no adenopathy, no JVD, supple, symmetrical, trachea midline and thyroid not enlarged, symmetric, no tenderness/mass/nodules Lymph nodes: Cervical, supraclavicular, and axillary  nodes normal. Resp: clear to auscultation bilaterally Back: symmetric, no curvature. ROM normal. No CVA tenderness. Cardio: regular rate and rhythm, S1, S2 normal, no murmur, click, rub or gallop GI: soft, non-tender; bowel sounds normal; no masses,  no organomegaly Extremities: extremities normal, atraumatic, no cyanosis or edema  ECOG PERFORMANCE STATUS: 1 - Symptomatic but completely ambulatory  Blood pressure (!) 125/57, pulse 86, temperature 98.3 F (36.8 C), temperature source Oral, resp. rate 17, height '5\' 4"'$  (1.626 m), weight 225 lb 8 oz (102.3 kg), SpO2 98 %.  LABORATORY DATA: Lab Results  Component Value Date   WBC 5.2 11/20/2016   HGB 13.3 11/20/2016   HCT 41.6 11/20/2016   MCV 93.3 11/20/2016   PLT 176 11/20/2016      Chemistry      Component Value Date/Time   NA 137 11/20/2016 1423   K 4.4 11/20/2016 1423   CL 100 (L) 10/29/2016 1222   CO2 26 11/20/2016 1423   BUN 10.7 11/20/2016 1423   CREATININE 0.9 11/20/2016 1423      Component Value Date/Time   CALCIUM 10.4 11/20/2016 1423    ALKPHOS 86 11/20/2016 1423   AST 20 11/20/2016 1423   ALT 15 11/20/2016 1423   BILITOT 0.56 11/20/2016 1423       RADIOGRAPHIC STUDIES: Ct Renal Stone Study  Result Date: 10/29/2016 CLINICAL DATA:  Right groin pain for 1 week. EXAM: CT ABDOMEN AND PELVIS WITHOUT CONTRAST TECHNIQUE: Multidetector CT imaging of the abdomen and pelvis was performed following the standard protocol without IV contrast. COMPARISON:  CT scan of July 13, 2016. FINDINGS: Lower chest: No acute abnormality. Hepatobiliary: 3.2 cm right hepatic lesion is noted consistent with metastatic disease. 2.2 cm left hepatic lesion is noted which is significantly enlarged compared to prior exam and also consistent with metastatic disease. No gallstones are noted. Pancreas: Unremarkable. No pancreatic ductal dilatation or surrounding inflammatory changes. Spleen: Normal in size without focal abnormality. Adrenals/Urinary Tract: Adrenal glands appear normal. Small nonobstructive left renal calculus is noted. No hydronephrosis or renal obstruction is noted. Urinary bladder is unremarkable. Stomach/Bowel: There is no evidence of bowel obstruction. Vascular/Lymphatic: Aortic atherosclerosis. No enlarged abdominal or pelvic lymph nodes. Reproductive: Status post hysterectomy. No adnexal masses. Other: No abdominal wall hernia or abnormality. No abdominopelvic ascites. Musculoskeletal: No acute or significant osseous findings. IMPRESSION: Worsening hepatic metastatic disease as described above. Nonobstructive left renal calculus. No hydronephrosis or renal obstruction is noted. Aortic atherosclerosis. Electronically Signed   By: Marijo Conception, M.D.   On: 10/29/2016 13:48   Ct Angio Abd/pel W/ And/or W/o  Result Date: 11/14/2016 CLINICAL DATA:  Neuroendocrine cancer with liver metastasis. Evaluate anatomy prior to potential radioembolization procedure. EXAM: CT ANGIOGRAPHY ABDOMEN AND PELVIS WITH CONTRAST AND WITHOUT CONTRAST TECHNIQUE:  Multidetector CT imaging of the abdomen and pelvis was performed using the standard protocol during bolus administration of intravenous contrast. Multiplanar reconstructed images and MIPs were obtained and reviewed to evaluate the vascular anatomy. CONTRAST:  100 mL Isovue 370 COMPARISON:  CT 10/29/2016 and 02/19/2008 FINDINGS: VASCULAR Aorta: Atherosclerotic disease and calcifications in the abdominal aorta without aneurysm or dissection. Celiac: Small amount of calcified plaque at the origin of the celiac trunk without any significant stenosis. Main branch vessels of the celiac trunk are the left gastric artery, splenic artery and common hepatic artery. Common hepatic artery terminates into the gastroduodenal artery and left hepatic artery. Right hepatic artery originates from the SMA. The right gastric artery comes off the left hepatic  artery and best seen on the coronal reformats, sequence 7, image 40. Right gastric artery is also visualized on sequence 4, image 34 on the axial images. SMA: Atherosclerotic calcifications at the SMA origin without significant stenosis. Replaced right hepatic artery comes off the SMA. Main SMA branches are patent. Renals: Single bilateral renal arteries. Calcified plaque at the origin of the renal arteries with mild right renal artery stenosis. No significant left renal artery stenosis. IMA: IMA is patent. Inflow: Atherosclerotic calcifications in the common iliac arteries without significant stenosis. External iliac arteries are patent. Veins: Iliac veins, IVC and renal veins are patent. Portal venous system is patent. Review of the MIP images confirms the above findings. NON-VASCULAR Lower chest: Again noted is a 3 mm nodule in the left lower lobe on sequence 3, image 4. No pleural effusions. Hepatobiliary: There is a slightly hypodense lesion in the anterior right hepatic lobe, segment 8, that measures up to 3.2 cm and stable from the recent comparison examination. Again noted  is a hypodense lesion in the lateral left hepatic lobe, segments 3, measuring up to 2.2 cm. Lesion at the left hepatic dome, segment 2, measuring up to 1.5 cm. There is an additional lesion in the posterior right hepatic lobe adjacent to the gallbladder measuring 0.9 cm. No intrahepatic biliary dilatation. Pancreas: Subtle soft tissue fullness at the tail of pancreas. Parenchymal fullness is slightly different than the remainder the pancreas and this area also corresponds with hypermetabolic activity from a previous PET-CT on 10/02/2016. This is concerning for a primary pancreatic lesion. No surrounding inflammatory changes and no duct dilatation in the pancreas. Spleen: Normal appearance of spleen without enlargement. Adrenals/Urinary Tract: Normal adrenal glands. Tiny stone in the left kidney lower pole. No hydronephrosis. Probable small cortical cyst in the right kidney interpolar region. Urinary bladder is unremarkable. Stomach/Bowel: No evidence for bowel dilatation or obstruction. No evidence for focal bowel inflammation. Lymphatic: No significant lymph node enlargement in the abdomen pelvis. No evidence for a mesenteric mass. Reproductive: Uterus has been removed. Stable low-density structure in the right adnexa which could represent a small cyst. Other: No free fluid.  No free air. Musculoskeletal: No suspicious bone lesions. IMPRESSION: VASCULAR Variant hepatic anatomy. Replaced right hepatic artery coming off the SMA. Right gastric artery originates from the left hepatic artery. NON-VASCULAR Hepatic metastasis.  There are at least four hepatic lesions. Fullness in the pancreatic tail may represent a primary pancreatic lesion and corresponding with the previous PET findings. The hepatic lesions are not FDG avid on the previous PET-CT. Consider Ga 68-DOTATATE imaging to assess and follow-up liver directed therapy. Small cystic structure in the right adnexa. This has not significantly changed since 02/19/2008  and likely benign. Tiny nonobstructive left kidney stone. Stable 3 mm pulmonary nodule in left lower lobe. Electronically Signed   By: Markus Daft M.D.   On: 11/14/2016 13:35    ASSESSMENT AND PLAN:  This is a very pleasant 83 years old white female with metastatic intermediate grade neuroendocrine carcinoma of questionable lung primary with multiple metastatic liver lesions as well as pancreatic lesions. The patient is currently undergoing treatment with Xeloda and Temodar status post 1 cycle and she is currently receiving cycle #2. She is tolerating her treatment well so far. She was also seen by interventional radiology and she is considered for treatment with Y 90 to the liver lesion in 2 weeks. I recommended for the patient to continue her treatment as a scheduled. I will see her back  for follow-up visit in 2 weeks for evaluation and repeat blood work. I may have to hold her treatment with Xeloda and Temodar until completion of the Y 90 treatment. She was advised to call immediately if she has any concerning symptoms in the interval. The patient voices understanding of current disease status and treatment options and is in agreement with the current care plan. All questions were answered. The patient knows to call the clinic with any problems, questions or concerns. We can certainly see the patient much sooner if necessary. I spent 10 minutes counseling the patient face to face. The total time spent in the appointment was 15 minutes.  Disclaimer: This note was dictated with voice recognition software. Similar sounding words can inadvertently be transcribed and may not be corrected upon review.

## 2016-11-26 ENCOUNTER — Ambulatory Visit (INDEPENDENT_AMBULATORY_CARE_PROVIDER_SITE_OTHER): Payer: Medicare Other | Admitting: *Deleted

## 2016-11-26 DIAGNOSIS — R002 Palpitations: Secondary | ICD-10-CM

## 2016-11-26 NOTE — Progress Notes (Signed)
Carelink Summary Report / Loop Recorder 

## 2016-11-27 ENCOUNTER — Other Ambulatory Visit: Payer: Self-pay | Admitting: Radiology

## 2016-11-28 ENCOUNTER — Ambulatory Visit (HOSPITAL_COMMUNITY)
Admission: RE | Admit: 2016-11-28 | Discharge: 2016-11-28 | Disposition: A | Discharge status: Home / Self Care | Payer: Medicare Other | Source: Ambulatory Visit | Attending: Interventional Radiology | Admitting: Interventional Radiology

## 2016-11-28 ENCOUNTER — Encounter (HOSPITAL_COMMUNITY): Payer: Self-pay

## 2016-11-28 ENCOUNTER — Encounter (HOSPITAL_COMMUNITY)
Admission: RE | Admit: 2016-11-28 | Discharge: 2016-11-28 | Disposition: A | Discharge status: Home / Self Care | Payer: Medicare Other | Source: Ambulatory Visit | Attending: Interventional Radiology | Admitting: Interventional Radiology

## 2016-11-28 ENCOUNTER — Other Ambulatory Visit (HOSPITAL_COMMUNITY): Payer: Self-pay | Admitting: Interventional Radiology

## 2016-11-28 DIAGNOSIS — C7B8 Other secondary neuroendocrine tumors: Secondary | ICD-10-CM

## 2016-11-28 DIAGNOSIS — C787 Secondary malignant neoplasm of liver and intrahepatic bile duct: Secondary | ICD-10-CM | POA: Diagnosis present

## 2016-11-28 DIAGNOSIS — F329 Major depressive disorder, single episode, unspecified: Secondary | ICD-10-CM | POA: Insufficient documentation

## 2016-11-28 DIAGNOSIS — Z8249 Family history of ischemic heart disease and other diseases of the circulatory system: Secondary | ICD-10-CM | POA: Diagnosis not present

## 2016-11-28 DIAGNOSIS — F419 Anxiety disorder, unspecified: Secondary | ICD-10-CM | POA: Diagnosis not present

## 2016-11-28 DIAGNOSIS — Z87891 Personal history of nicotine dependence: Secondary | ICD-10-CM | POA: Diagnosis not present

## 2016-11-28 DIAGNOSIS — C7A8 Other malignant neuroendocrine tumors: Secondary | ICD-10-CM | POA: Insufficient documentation

## 2016-11-28 DIAGNOSIS — M1990 Unspecified osteoarthritis, unspecified site: Secondary | ICD-10-CM | POA: Diagnosis not present

## 2016-11-28 DIAGNOSIS — E039 Hypothyroidism, unspecified: Secondary | ICD-10-CM | POA: Diagnosis not present

## 2016-11-28 DIAGNOSIS — R32 Unspecified urinary incontinence: Secondary | ICD-10-CM | POA: Insufficient documentation

## 2016-11-28 DIAGNOSIS — K589 Irritable bowel syndrome without diarrhea: Secondary | ICD-10-CM | POA: Diagnosis not present

## 2016-11-28 DIAGNOSIS — E669 Obesity, unspecified: Secondary | ICD-10-CM | POA: Insufficient documentation

## 2016-11-28 DIAGNOSIS — Z8673 Personal history of transient ischemic attack (TIA), and cerebral infarction without residual deficits: Secondary | ICD-10-CM | POA: Diagnosis not present

## 2016-11-28 DIAGNOSIS — G40909 Epilepsy, unspecified, not intractable, without status epilepticus: Secondary | ICD-10-CM | POA: Diagnosis not present

## 2016-11-28 DIAGNOSIS — I480 Paroxysmal atrial fibrillation: Secondary | ICD-10-CM | POA: Diagnosis not present

## 2016-11-28 DIAGNOSIS — I10 Essential (primary) hypertension: Secondary | ICD-10-CM | POA: Insufficient documentation

## 2016-11-28 DIAGNOSIS — E7800 Pure hypercholesterolemia, unspecified: Secondary | ICD-10-CM | POA: Diagnosis not present

## 2016-11-28 DIAGNOSIS — K219 Gastro-esophageal reflux disease without esophagitis: Secondary | ICD-10-CM | POA: Insufficient documentation

## 2016-11-28 HISTORY — PX: IR EMBO ARTERIAL NOT HEMORR HEMANG INC GUIDE ROADMAPPING: IMG5448

## 2016-11-28 HISTORY — PX: IR ANGIOGRAM VISCERAL SELECTIVE: IMG657

## 2016-11-28 HISTORY — PX: IR US GUIDE VASC ACCESS RIGHT: IMG2390

## 2016-11-28 HISTORY — PX: IR ANGIOGRAM SELECTIVE EACH ADDITIONAL VESSEL: IMG667

## 2016-11-28 LAB — COMPREHENSIVE METABOLIC PANEL
ALBUMIN: 4 g/dL (ref 3.5–5.0)
ALK PHOS: 81 U/L (ref 38–126)
ALT: 17 U/L (ref 14–54)
ANION GAP: 8 (ref 5–15)
AST: 26 U/L (ref 15–41)
BUN: 12 mg/dL (ref 6–20)
CHLORIDE: 102 mmol/L (ref 101–111)
CO2: 27 mmol/L (ref 22–32)
Calcium: 9.8 mg/dL (ref 8.9–10.3)
Creatinine, Ser: 0.9 mg/dL (ref 0.44–1.00)
GFR calc non Af Amer: 59 mL/min — ABNORMAL LOW (ref >60)
Glucose, Bld: 110 mg/dL — ABNORMAL HIGH (ref 65–99)
POTASSIUM: 4.5 mmol/L (ref 3.5–5.1)
SODIUM: 137 mmol/L (ref 135–145)
Total Bilirubin: 0.4 mg/dL (ref 0.3–1.2)
Total Protein: 7.6 g/dL (ref 6.5–8.1)

## 2016-11-28 LAB — CBC WITH DIFFERENTIAL/PLATELET
BASOS PCT: 0 %
Basophils Absolute: 0 10*3/uL (ref 0.0–0.1)
EOS ABS: 0.3 10*3/uL (ref 0.0–0.7)
EOS PCT: 5 %
HCT: 39.6 % (ref 36.0–46.0)
HEMOGLOBIN: 13.1 g/dL (ref 12.0–15.0)
Lymphocytes Relative: 27 %
Lymphs Abs: 1.6 10*3/uL (ref 0.7–4.0)
MCH: 30.8 pg (ref 26.0–34.0)
MCHC: 33.1 g/dL (ref 30.0–36.0)
MCV: 93.2 fL (ref 78.0–100.0)
Monocytes Absolute: 0.9 10*3/uL (ref 0.1–1.0)
Monocytes Relative: 15 %
NEUTROS PCT: 53 %
Neutro Abs: 3.2 10*3/uL (ref 1.7–7.7)
PLATELETS: 189 10*3/uL (ref 150–400)
RBC: 4.25 MIL/uL (ref 3.87–5.11)
RDW: 16.7 % — AB (ref 11.5–15.5)
WBC: 6 10*3/uL (ref 4.0–10.5)

## 2016-11-28 LAB — PROTIME-INR
INR: 1.04
Prothrombin Time: 13.6 seconds (ref 11.4–15.2)

## 2016-11-28 MED ORDER — FENTANYL CITRATE (PF) 100 MCG/2ML IJ SOLN
INTRAMUSCULAR | Status: AC
  Filled 2016-11-28: qty 4

## 2016-11-28 MED ORDER — FENTANYL CITRATE (PF) 100 MCG/2ML IJ SOLN
INTRAMUSCULAR | Status: AC | PRN
  Administered 2016-11-28: 50 ug via INTRAVENOUS
  Administered 2016-11-28: 100 ug via INTRAVENOUS
  Administered 2016-11-28: 50 ug via INTRAVENOUS

## 2016-11-28 MED ORDER — IOPAMIDOL (ISOVUE-300) INJECTION 61%
100.0000 mL | Freq: Once | INTRAVENOUS | Status: AC | PRN
  Administered 2016-11-28: 80 mL via INTRAVENOUS

## 2016-11-28 MED ORDER — FENTANYL CITRATE (PF) 100 MCG/2ML IJ SOLN
INTRAMUSCULAR | Status: AC
  Filled 2016-11-28: qty 2

## 2016-11-28 MED ORDER — TECHNETIUM TO 99M ALBUMIN AGGREGATED: 4.8000 | Freq: Once | INTRAVENOUS | Status: DC | PRN

## 2016-11-28 MED ORDER — MIDAZOLAM HCL 2 MG/2ML IJ SOLN
INTRAMUSCULAR | Status: AC
  Filled 2016-11-28: qty 4

## 2016-11-28 MED ORDER — IOPAMIDOL (ISOVUE-300) INJECTION 61%
100.0000 mL | Freq: Once | INTRAVENOUS | Status: AC | PRN
  Administered 2016-11-28: 26 mL via INTRAVENOUS

## 2016-11-28 MED ORDER — IOPAMIDOL (ISOVUE-300) INJECTION 61%
100.0000 mL | Freq: Once | INTRAVENOUS | Status: AC | PRN
  Administered 2016-11-28: 12 mL via INTRAVENOUS

## 2016-11-28 MED ORDER — MIDAZOLAM HCL 2 MG/2ML IJ SOLN
INTRAMUSCULAR | Status: AC | PRN
  Administered 2016-11-28: 1 mg via INTRAVENOUS
  Administered 2016-11-28: 2 mg via INTRAVENOUS
  Administered 2016-11-28: 1 mg via INTRAVENOUS

## 2016-11-28 MED ORDER — MIDAZOLAM HCL 2 MG/2ML IJ SOLN
INTRAMUSCULAR | Status: AC
  Filled 2016-11-28: qty 2

## 2016-11-28 MED ORDER — LIDOCAINE-EPINEPHRINE (PF) 2 %-1:200000 IJ SOLN
INTRAMUSCULAR | Status: AC | PRN
  Administered 2016-11-28: 10 mL

## 2016-11-28 MED ORDER — LIDOCAINE HCL 1 % IJ SOLN
INTRAMUSCULAR | Status: AC
  Filled 2016-11-28: qty 20

## 2016-11-28 MED ORDER — SODIUM CHLORIDE 0.9 % IV SOLN
INTRAVENOUS | Status: DC
  Administered 2016-11-28: 08:00:00 via INTRAVENOUS

## 2016-11-28 MED ORDER — IOPAMIDOL (ISOVUE-300) INJECTION 61%
INTRAVENOUS | Status: AC
  Filled 2016-11-28: qty 400

## 2016-11-28 NOTE — Sedation Documentation (Signed)
Patient denies pain and is resting comfortably.  

## 2016-11-28 NOTE — H&P (Signed)
Chief Complaint: neuroendocrine lung cancer with liver mets  Referring Physician:Dr. Curt Bears  Supervising Physician: Sandi Mariscal  Patient Status: [facility] - Out-pt  HPI: [name] is a 83 y.o. female who was recently diagnosed with neuroendocrine lung cancer.  She is followed by Dr. [name] and has tolerated her first two cycles of Xeloda and Temodar.  She was sent to Dr. Pascal Lux 2-3 weeks ago for evaluation of treatment with y-90 radioembolization secondary to increasing hepatic tumor burden found on a scan in early April.  After evaluation, the patient and her husband agreed to proceed with Y-90 treatment.  She presents today for the roadmapping portion of this procedure.  Past Medical History:  Past Medical History:  Diagnosis Date  . Acute respiratory failure with hypoxia ([place]) 10/23/2015  . Allergic rhinitis    PT. DENIES  . Anxiety   . Arthritis    NECK  . Ataxia   . Bradycardia    primarily nocturnal  . Burning tongue syndrome 25 years  . Cancer ([place])   . Cataract   . Cerebellar degeneration   . Chronic pericarditis   . Chronic urinary tract infection   . Complication of anesthesia    low o2 sats, coded 30 years ago  . CVA (cerebral infarction) 05/2003  . Depression   . Encounter for antineoplastic chemotherapy 10/09/2016  . Gait disorder   . Gastric polyps   . GERD (gastroesophageal reflux disease)   . Goals of care, counseling/discussion 10/09/2016  . High cholesterol   . Hyperlipidemia   . Hypertension   . Hypertension   . Hypotension   . Hypothyroidism   . IBS (irritable bowel syndrome)   . Obesity   . Paroxysmal atrial fibrillation ([name])    chads2vasc score is 6,  she is felt to be a poor candidate for anticoagulation  . Personal history of arterial venous malformation (AVM)    right side of face  . Seizure disorder ([place])   . Seizures ([place]) 2003   " smelling"- Gabapentin "no problem"  . Shortness of breath dyspnea    with exertion  . Sternum  fx 10/27/2013  . Stroke [facility]) 5 years ago   Right side of face weak, slurred speach-   . Thyroid disease   . TIA (transient ischemic attack)   . UTI (lower urinary tract infection) 03/27/2016   "frequently"    Past Surgical History:  Past Surgical History:  Procedure Laterality Date  . APPENDECTOMY  83 years old  . COLONOSCOPY  2006, 2009  . COLONOSCOPY WITH PROPOFOL N/A 03/28/2016   Procedure: COLONOSCOPY WITH PROPOFOL;  Surgeon: Gatha Mayer, MD;  Location: [facility];  Service: Endoscopy;  Laterality: N/A;  . cyst removed  35 years ago  . EP IMPLANTABLE DEVICE N/A 01/06/2015   Procedure: Loop Recorder Insertion;  Surgeon: Thompson Grayer, MD;  Location: [place] [facility];  Service: Cardiovascular;  Laterality: N/A;  . EYE SURGERY Right    Cataract  . KNEE ARTHROSCOPY Right 11/14/2006  . KNEE ARTHROSCOPY Bilateral 5 and 6 years ago  . KNEE ARTHROSCOPY WITH LATERAL MENISECTOMY  07/03/2012   Procedure: KNEE ARTHROSCOPY WITH LATERAL MENISECTOMY;  Surgeon: Magnus Sinning, MD;  Location: [facility];  Service: Orthopedics;  Laterality: Left;  with Partial Lateral Menisectomy and Medial Menisectomy. Shaving of medial and lateral femoral condyles. Shaving of patella. Removal of a loose body  . tibial and fibular internal fixation Left   . TOTAL ABDOMINAL HYSTERECTOMY  83  years old  . UPPER GASTROINTESTINAL ENDOSCOPY  2009, 2013    Family History:  Family History  Problem Relation Age of Onset  . Heart attack Father 17    fatal  . Coronary artery disease Brother   . Diabetes Brother   . Prostate cancer Brother   . Prostate cancer Son     Social History:  reports that she quit smoking about 41 years ago. Her smoking use included Cigarettes. She has a 2.50 pack-year smoking history. She has never used smokeless tobacco. She reports that she does not drink alcohol or use drugs.  Allergies:  Allergies  Allergen Reactions  . Lisinopril Cough    Medications: Medications reviewed in  epic  Please HPI for pertinent positives, otherwise complete 10 system ROS negative.  Mallampati Score: MD Evaluation Airway: WNL Heart: WNL Abdomen: WNL Chest/ Lungs: WNL ASA  Classification: 3 Mallampati/Airway Score: One  Physical Exam: BP 136/67   Pulse 78   Temp 97.7 F (36.5 C) (Oral)   Resp 18   SpO2 98%    General: pleasant, elderly white female who is laying in bed in NAD HEENT: head is normocephalic, atraumatic.  Sclera are noninjected.  PERRL.  Ears and nose without any masses or lesions.  Mouth is pink and moist Heart: regular, rate, and rhythm.  Normal s1,s2. + murmur. No obvious gallops, or rubs noted.  Palpable radial and pedal pulses bilaterally. +2 edema in lower extremities Lungs: CTAB, no wheezes, rhonchi, or rales noted.  Respiratory effort nonlabored Abd: soft, NT, ND, +BS, no masses, hernias, or organomegaly Psych: A&Ox3 with an appropriate affect.   Labs: Results for orders placed or performed during the hospital encounter of 11/28/16 (from the past 48 hour(s))  CBC with Differential/Platelet     Status: Abnormal   Collection Time: 11/28/16  7:43 AM  Result Value Ref Range   WBC 6.0 4.0 - 10.5 K/uL   RBC 4.25 3.87 - 5.11 MIL/uL   Hemoglobin 13.1 12.0 - 15.0 g/dL   HCT 39.6 36.0 - 46.0 %   MCV 93.2 78.0 - 100.0 fL   MCH 30.8 26.0 - 34.0 pg   MCHC 33.1 30.0 - 36.0 g/dL   RDW 16.7 (H) 11.5 - 15.5 %   Platelets 189 150 - 400 K/uL   Neutrophils Relative % 53 %   Neutro Abs 3.2 1.7 - 7.7 K/uL   Lymphocytes Relative 27 %   Lymphs Abs 1.6 0.7 - 4.0 K/uL   Monocytes Relative 15 %   Monocytes Absolute 0.9 0.1 - 1.0 K/uL   Eosinophils Relative 5 %   Eosinophils Absolute 0.3 0.0 - 0.7 K/uL   Basophils Relative 0 %   Basophils Absolute 0.0 0.0 - 0.1 K/uL  Comprehensive metabolic panel     Status: Abnormal   Collection Time: 11/28/16  7:43 AM  Result Value Ref Range   Sodium 137 135 - 145 mmol/L   Potassium 4.5 3.5 - 5.1 mmol/L   Chloride 102 101 -  111 mmol/L   CO2 27 22 - 32 mmol/L   Glucose, Bld 110 (H) 65 - 99 mg/dL   BUN 12 6 - 20 mg/dL   Creatinine, Ser 0.90 0.44 - 1.00 mg/dL   Calcium 9.8 8.9 - 10.3 mg/dL   Total Protein 7.6 6.5 - 8.1 g/dL   Albumin 4.0 3.5 - 5.0 g/dL   AST 26 15 - 41 U/L   ALT 17 14 - 54 U/L   Alkaline Phosphatase 81 38 -  126 U/L   Total Bilirubin 0.4 0.3 - 1.2 mg/dL   GFR calc non Af Amer 59 (L) >60 mL/min   GFR calc Af Amer >60 >60 mL/min    Comment: (NOTE) The eGFR has been calculated using the CKD EPI equation. This calculation has not been validated in all clinical situations. eGFR's persistently <60 mL/min signify possible Chronic Kidney Disease.    Anion gap 8 5 - 15  Protime-INR     Status: None   Collection Time: 11/28/16  7:43 AM  Result Value Ref Range   Prothrombin Time 13.6 11.4 - 15.2 seconds   INR 1.04     Imaging: No results found.  Assessment/Plan 1. Neuroendocrine lung cancer with metastatic liver lesions We will plan to move forward with pre Y-90 radioembolization roadmapping today with any other embolization if needed.  The patient is nervous about the procedure and about the fact that she has a "weak bladder."  She is unable to void on the bedpan and since she will be unable to get up during or after the procedure, Dr. Pascal Lux has stated we can place a catheter if she is agreeable.  She is, and this will be placed in short stay prior to her procedure.    Her labs and vitals have been reviewed.   Risks and Benefits discussed with the patient including, but not limited to bleeding, infection, vascular injury, post procedural pain, nausea, vomiting and fatigue, contrast induced renal failure. All of the patient's questions were answered, patient is agreeable to proceed. Consent signed and in chart.  Thank you for this interesting consult.  I greatly enjoyed meeting [name] and look forward to participating in their care.  A copy of this report was sent to the requesting  provider on this date.  Electronically Signed: Henreitta Cea 11/28/2016, 9:20 AM   I spent a total of    25 Minutes in face to face in clinical consultation, greater than 50% of which was counseling/coordinating care for neuroendocrine lung cancer with liver mets

## 2016-11-28 NOTE — Procedures (Signed)
Pre-procedure Diagnosis: Metastatic Neuroendocrine Cancer Post-procedure Diagnosis: Same  Post pre Y-90.    Complications: None Immediate  EBL: None  Keep right leg straight for 4 hrs.    SignedSandi Mariscal Pager: [phone] 11/28/2016, 11:52 AM

## 2016-11-28 NOTE — Discharge Instructions (Signed)
Hepatic Artery Radioembolization, Care After This sheet gives you information about how to care for yourself after your procedure. Your health care provider may also give you more specific instructions. If you have problems or questions, contact your health care provider. What can I expect after the procedure? After the procedure, it is common to have:  A slight fever for 1-2 weeks. If your fever gets worse, tell your health care provider.  Fatigue.  Loss of appetite. This should gradually improve after about 1 week.  Abdominal pain on your right side.  Soreness and tenderness in your groin area where the needle and catheter were placed (puncture site). Follow these instructions at home: Puncture site care   Follow instructions from your health care provider about how to take care of the puncture site. Make sure you:  Wash your hands with soap and water before you change your bandage (dressing). If soap and water are not available, use hand sanitizer.  Change your dressing as told by your health care provider.  Leave stitches (sutures), skin glue, or adhesive strips in place. These skin closures may need to stay in place for 2 weeks or longer. If adhesive strip edges start to loosen and curl up, you may trim the loose edges. Do not remove adhesive strips completely unless your health care provider tells you to do that.  Check your puncture site every day for signs of infection. Check for:  More redness, swelling, or pain.  More fluid or blood.  Warmth.  Pus or a bad smell. Activity   Rest and return to your normal activities as told by your health care provider. Ask your health care provider what activities are safe for you.  Do not drive for 24 hours after the procedure if you were given a medicine to help you relax (sedative).  Do not lift anything that is heavier than 10 lb (4.5 kg) until your health care provider says that it is safe. Medicines   Take over-the-counter  and prescription medicines only as told by your health care provider.  Do not drive or use heavy machinery while taking prescription pain medicine. Radiation precautions   For up to a week after your procedure, there will be a small amount of radioactivity near your liver. This is not especially dangerous to other people. However, you should follow these precautions for 7 days:  Do not come in close contact with people.  Do not sleep in the same bed as someone else.  Do not hold children or babies.  Do not have contact with pregnant women. General instructions    To prevent or treat constipation while you are taking prescription pain medicine, your health care provider may recommend that you:  Drink enough fluid to keep your urine clear or pale yellow.  Take over-the-counter or prescription medicines.  Eat foods that are high in fiber, such as fresh fruits and vegetables, whole grains, and beans.  Limit foods that are high in fat and processed sugars, such as fried and sweet foods.  Eat frequent small meals until your appetite returns. Follow instructions from your health care provider about eating or drinking restrictions.  Do not take baths, swim, or use a hot tub until your health care provider approves. You may take showers. Wash your puncture site with mild soap and water and pat the area dry.  Wear compression stockings as told by your health care provider. These stockings help to prevent blood clots and reduce swelling in your legs.  Keep all follow-up visits as told by your health care provider. This is important. You may need to have blood tests and imaging tests done. Contact a health care provider if:  You have more redness, swelling, or pain around your puncture site.  You have more fluid or blood coming from your puncture site.  Your puncture site feels warm to the touch.  You have pus or a bad smell coming from your puncture site.  You have pain that:  Gets  worse.  Does not get better with medicine.  Feels like very bad heartburn.  Is in the middle of your abdomen, above your belly button.  Your skin and the white parts of your eyes turn yellow (jaundice).  The color of your urine changes to dark brown.  The color of your stool changes to light yellow.  Your abdominal measurement (girth) increases in a short period of time.  You gain more than 5 lb (2.3 kg) in a short period of time. Get help right away if:  You have a fever that lasts longer than 2 weeks or is higher than what your health care provider told you to expect.  You develop any of the following in your legs:  Pain.  Swelling.  Skin that is cold or pale or turns blue.  You have chest pain.  You have blood in your vomit, saliva, or stool.  You have trouble breathing. This information is not intended to replace advice given to you by your health care provider. Make sure you discuss any questions you have with your health care provider. Document Released: 07/21/2013 Document Revised: 04/14/2016 Document Reviewed: 04/14/2016 Elsevier Interactive Patient Education  2017 [place] Radioembolization Discharge Instructions  You have been given a radioactive material during your procedure.  While it is safe for you to be discharged home from the hospital, you need to proceed directly home.    Do not use public transportation, including air travel, lasting more than 2 hours for 1 week.  Avoid crowded public places for 1 week.  Adult visitors should try to avoid close contact with you for 1 week.    Children and pregnant females should not visit or have close contact with you for 1 week.  Items that you touch are not radioactive.  Do not sleep in the same bed as your partner for 1 week, and a condom should be used for sexual activity during the first 24 hours.  Your blood may be radioactive and caution should be used if any bleeding occurs during  the recovery period.  Body fluids may be radioactive for 24 hours.  Wash your hands after voiding.  Men should sit to urinate.  Dispose of any soiled materials (flush down toilet or place in trash at home) during the first day.  Drink 6 to 8 glasses of fluids per day for 5 days to hydrate yourself.  If you need to see a doctor during the first week, you must let them know that you were treated with [id number] microspheres, and will be slightly radioactive.  They can call Interventional Radiology [phone] with any questions.    Moderate Conscious Sedation, Adult, Care After These instructions provide you with information about caring for yourself after your procedure. Your health care provider may also give you more specific instructions. Your treatment has been planned according to current medical practices, but problems sometimes occur. Call your health care provider if you have any problems or questions after your  procedure. What can I expect after the procedure? After your procedure, it is common:  To feel sleepy for several hours.  To feel clumsy and have poor balance for several hours.  To have poor judgment for several hours.  To vomit if you eat too soon. Follow these instructions at home: For at least 24 hours after the procedure:    Do not:  Participate in activities where you could fall or become injured.  Drive.  Use heavy machinery.  Drink alcohol.  Take sleeping pills or medicines that cause drowsiness.  Make important decisions or sign legal documents.  Take care of children on your own.  Rest. Eating and drinking   Follow the diet recommended by your health care provider.  If you vomit:  Drink water, juice, or soup when you can drink without vomiting.  Make sure you have little or no nausea before eating solid foods. General instructions   Have a responsible adult stay with you until you are awake and alert.  Take over-the-counter and  prescription medicines only as told by your health care provider.  If you smoke, do not smoke without supervision.  Keep all follow-up visits as told by your health care provider. This is important. Contact a health care provider if:  You keep feeling nauseous or you keep vomiting.  You feel light-headed.  You develop a rash.  You have a fever. Get help right away if:  You have trouble breathing. This information is not intended to replace advice given to you by your health care provider. Make sure you discuss any questions you have with your health care provider. Document Released: 05/06/2013 Document Revised: 12/19/2015 Document Reviewed: 11/05/2015 Elsevier Interactive Patient Education  2017 [employer].

## 2016-12-03 MED FILL — TEMOZOLOMIDE 140 MG CAPSULE: 140 | 5 days supply | Qty: 10 | Fill #2

## 2016-12-03 MED FILL — CAPECITABINE 500 MG TABLET: 500 | 14 days supply | Qty: 84 | Fill #2

## 2016-12-05 ENCOUNTER — Ambulatory Visit (HOSPITAL_BASED_OUTPATIENT_CLINIC_OR_DEPARTMENT_OTHER): Payer: Medicare Other | Admitting: Internal Medicine

## 2016-12-05 ENCOUNTER — Telehealth: Payer: Self-pay | Admitting: Internal Medicine

## 2016-12-05 ENCOUNTER — Other Ambulatory Visit (HOSPITAL_BASED_OUTPATIENT_CLINIC_OR_DEPARTMENT_OTHER): Payer: Medicare Other

## 2016-12-05 ENCOUNTER — Encounter: Payer: Self-pay | Admitting: Internal Medicine

## 2016-12-05 VITALS — BP 122/48 | HR 72 | Temp 98.0°F | Resp 21 | Ht 64.0 in | Wt 226.6 lb

## 2016-12-05 DIAGNOSIS — C787 Secondary malignant neoplasm of liver and intrahepatic bile duct: Secondary | ICD-10-CM

## 2016-12-05 DIAGNOSIS — C7B8 Other secondary neuroendocrine tumors: Secondary | ICD-10-CM

## 2016-12-05 DIAGNOSIS — R109 Unspecified abdominal pain: Secondary | ICD-10-CM | POA: Diagnosis not present

## 2016-12-05 DIAGNOSIS — I10 Essential (primary) hypertension: Secondary | ICD-10-CM

## 2016-12-05 DIAGNOSIS — C7A8 Other malignant neuroendocrine tumors: Secondary | ICD-10-CM

## 2016-12-05 DIAGNOSIS — Z5111 Encounter for antineoplastic chemotherapy: Secondary | ICD-10-CM

## 2016-12-05 LAB — CBC WITH DIFFERENTIAL/PLATELET
BASO%: 0.8 % (ref 0.0–2.0)
BASOS ABS: 0 10*3/uL (ref 0.0–0.1)
EOS ABS: 0.2 10*3/uL (ref 0.0–0.5)
EOS%: 6.5 % (ref 0.0–7.0)
HCT: 38.4 % (ref 34.8–46.6)
HGB: 12.5 g/dL (ref 11.6–15.9)
LYMPH%: 26.2 % (ref 14.0–49.7)
MCH: 30.6 pg (ref 25.1–34.0)
MCHC: 32.6 g/dL (ref 31.5–36.0)
MCV: 94 fL (ref 79.5–101.0)
MONO#: 0.6 10*3/uL (ref 0.1–0.9)
MONO%: 17.7 % — ABNORMAL HIGH (ref 0.0–14.0)
NEUT%: 48.8 % (ref 38.4–76.8)
NEUTROS ABS: 1.7 10*3/uL (ref 1.5–6.5)
PLATELETS: 156 10*3/uL (ref 145–400)
RBC: 4.08 10*6/uL (ref 3.70–5.45)
RDW: 18.6 % — ABNORMAL HIGH (ref 11.2–14.5)
WBC: 3.6 10*3/uL — ABNORMAL LOW (ref 3.9–10.3)
lymph#: 0.9 10*3/uL (ref 0.9–3.3)

## 2016-12-05 LAB — COMPREHENSIVE METABOLIC PANEL
ALT: 14 U/L (ref 0–55)
ANION GAP: 8 meq/L (ref 3–11)
AST: 17 U/L (ref 5–34)
Albumin: 3.6 g/dL (ref 3.5–5.0)
Alkaline Phosphatase: 78 U/L (ref 40–150)
BILIRUBIN TOTAL: 0.5 mg/dL (ref 0.20–1.20)
BUN: 12.9 mg/dL (ref 7.0–26.0)
CO2: 25 meq/L (ref 22–29)
Calcium: 9.8 mg/dL (ref 8.4–10.4)
Chloride: 105 mEq/L (ref 98–109)
Creatinine: 0.9 mg/dL (ref 0.6–1.1)
EGFR: 59 mL/min/{1.73_m2} — AB (ref >90)
Glucose: 122 mg/dl (ref 70–140)
POTASSIUM: 4.5 meq/L (ref 3.5–5.1)
Sodium: 139 mEq/L (ref 136–145)
TOTAL PROTEIN: 6.7 g/dL (ref 6.4–8.3)

## 2016-12-05 LAB — LACTATE DEHYDROGENASE: LDH: 136 U/L (ref 125–245)

## 2016-12-05 MED ORDER — TEMOZOLOMIDE 140 MG PO CAPS: 150.0000 mg/m2/d | ORAL_CAPSULE | Freq: Every day | ORAL | 2 refills | Status: DC

## 2016-12-05 MED ORDER — CAPECITABINE 500 MG PO TABS: 750.0000 mg/m2 | ORAL_TABLET | Freq: Two times a day (BID) | ORAL | 2 refills | Status: DC

## 2016-12-05 MED ORDER — OXYCODONE-ACETAMINOPHEN 5-325 MG PO TABS: 1.0000 | ORAL_TABLET | ORAL | 0 refills | Status: DC | PRN

## 2016-12-05 NOTE — Telephone Encounter (Signed)
Appointments scheduled per 5.9.18 LOS. Patient given AVS report and calendars with future scheduled appointments.

## 2016-12-05 NOTE — Progress Notes (Signed)
[facility] [phone]   [phone]  OFFICE PROGRESS NOTE  [name], [place] [address]  DIAGNOSIS: Metastatic intermediate. Neuroendocrine tumor of lung primary diagnosed in January 2018 and presented with small bilateral pulmonary nodules in addition to multiple liver metastasis.  PRIOR THERAPY: None  CURRENT THERAPY: Xeloda 750 MG/M2 twice a day days 1-14 and Temodar 150 MG/M2 days 10-14 every 4 weeks. Status post 2 cycles. She started cycle #2 on 11/09/2016.  INTERVAL HISTORY: [name] 83 y.o. female returns to the clinic today for follow-up visit. The patient is feeling fine today with no specific complaints except for intermittent epigastric pain. It improved with Percocet. She denied having any current nausea or vomiting. She has no fever or chills. She denied having any chest pain, shortness of breath, cough or hemoptysis. She has no weight loss or night sweats. She is currently undergoing evaluation for the Y90 treatment for her neuroendocrine tumor of the liver. She is expected to have this procedure on 12/13/2016. She is here today for evaluation with repeat blood work.   MEDICAL HISTORY: Past Medical History:  Diagnosis Date  . Acute respiratory failure with hypoxia ([facility]) 10/23/2015  . Allergic rhinitis    PT. DENIES  . Anxiety   . Arthritis    NECK  . Ataxia   . Bradycardia    primarily nocturnal  . Burning tongue syndrome 25 years  . Cancer ([facility])   . Cataract   . Cerebellar degeneration   . Chronic pericarditis   . Chronic urinary tract infection   . Complication of anesthesia    low o2 sats, coded 30 years ago  . CVA (cerebral infarction) 05/2003  . Depression   . Encounter for antineoplastic chemotherapy 10/09/2016  . Gait disorder   . Gastric polyps   . GERD (gastroesophageal reflux disease)   . Goals of care, counseling/discussion 10/09/2016  . High cholesterol   . Hyperlipidemia     . Hypertension   . Hypertension   . Hypotension   . Hypothyroidism   . IBS (irritable bowel syndrome)   . Obesity   . Paroxysmal atrial fibrillation ([name])    chads2vasc score is 6,  she is felt to be a poor candidate for anticoagulation  . Personal history of arterial venous malformation (AVM)    right side of face  . Seizure disorder ([facility])   . Seizures ([place]) 2003   " smelling"- Gabapentin "no problem"  . Shortness of breath dyspnea    with exertion  . Sternum fx 10/27/2013  . Stroke [facility]) 5 years ago   Right side of face weak, slurred speach-   . Thyroid disease   . TIA (transient ischemic attack)   . UTI (lower urinary tract infection) 03/27/2016   "frequently"    ALLERGIES:  is allergic to lisinopril.  MEDICATIONS:  Current Outpatient Prescriptions  Medication Sig Dispense Refill  . amitriptyline (ELAVIL) 25 MG tablet Take 1 tablet (25 mg total) by mouth at bedtime. 90 tablet 1  . capecitabine (XELODA) 500 MG tablet Take 3 tablets (1,500 mg total) by mouth 2 (two) times daily after a meal. 84 tablet 2  . escitalopram (LEXAPRO) 20 MG tablet Take 1 tablet (20 mg total) by mouth at bedtime. 90 tablet 1  . fluticasone (FLONASE) 50 MCG/ACT nasal spray Place 2 sprays into both nostrils at bedtime. 16 g 3  . furosemide (LASIX) 40 MG tablet Take 40  mg by mouth daily as needed for edema.     . gabapentin (NEURONTIN) 300 MG capsule Take 1-2 capsules (300-600 mg total) by mouth 4 (four) times daily. 450 capsule 1  . HYDROcodone-acetaminophen (NORCO/VICODIN) 5-325 MG tablet Take 1 tablet by mouth every 6 (six) hours as needed for moderate pain. (Patient not taking: Reported on 10/31/2016) 30 tablet 0  . hydroxypropyl methylcellulose / hypromellose (ISOPTO TEARS / GONIOVISC) 2.5 % ophthalmic solution Place 1 drop into both eyes at bedtime.    Marland Kitchen levothyroxine (SYNTHROID, LEVOTHROID) 75 MCG tablet TAKE 1 TABLET DAILY BEFORE BREAKFAST 90 tablet 0  . losartan (COZAAR) 50 MG tablet TAKE 1  TABLET DAILY 90 tablet 1  . lovastatin (MEVACOR) 40 MG tablet TAKE 1 TABLET AT BEDTIME 90 tablet 1  . Melatonin 3 MG TBDP Take 3-6 mg by mouth at bedtime as needed. (Patient taking differently: Take 3-6 mg by mouth at bedtime as needed (for sleep). ) 60 tablet 1  . nitrofurantoin (MACRODANTIN) 50 MG capsule Take 50 mg by mouth at bedtime.    Marland Kitchen omeprazole (PRILOSEC) 40 MG capsule TAKE 1 CAPSULE DAILY 90 capsule 1  . ondansetron (ZOFRAN) 8 MG tablet Take 1 tablet (8 mg total) by mouth every 8 (eight) hours as needed for nausea or vomiting. 20 tablet 1  . oxyCODONE-acetaminophen (PERCOCET) 5-325 MG tablet Take 1-2 tablets by mouth every 4 (four) hours as needed. (Patient not taking: Reported on 11/06/2016) 30 tablet 0  . temozolomide (TEMODAR) 140 MG capsule Take 2 capsules (280 mg total) by mouth daily. May take on an empty stomach or at bedtime to decrease nausea & vomiting. 10 capsule 2  . Vitamin D, Ergocalciferol, (DRISDOL) 50000 units CAPS capsule Take 1 capsule (50,000 Units total) by mouth every 7 (seven) days. (Patient taking differently: Take 50,000 Units by mouth every Friday. ) 12 capsule 1   No current facility-administered medications for this visit.     SURGICAL HISTORY:  Past Surgical History:  Procedure Laterality Date  . APPENDECTOMY  83 years old  . COLONOSCOPY  2006, 2009  . COLONOSCOPY WITH PROPOFOL N/A 03/28/2016   Procedure: COLONOSCOPY WITH PROPOFOL;  Surgeon: Gatha Mayer, MD;  Location: [facility];  Service: Endoscopy;  Laterality: N/A;  . cyst removed  35 years ago  . EP IMPLANTABLE DEVICE N/A 01/06/2015   Procedure: Loop Recorder Insertion;  Surgeon: Thompson Grayer, MD;  Location: [facility];  Service: Cardiovascular;  Laterality: N/A;  . EYE SURGERY Right    Cataract  . IR ANGIOGRAM SELECTIVE EACH ADDITIONAL VESSEL  11/28/2016  . IR ANGIOGRAM SELECTIVE EACH ADDITIONAL VESSEL  11/28/2016  . IR ANGIOGRAM SELECTIVE EACH ADDITIONAL VESSEL  11/28/2016  . IR ANGIOGRAM  SELECTIVE EACH ADDITIONAL VESSEL  11/28/2016  . IR ANGIOGRAM VISCERAL SELECTIVE  11/28/2016  . IR ANGIOGRAM VISCERAL SELECTIVE  11/28/2016  . IR EMBO ARTERIAL NOT HEMORR HEMANG INC GUIDE ROADMAPPING  11/28/2016  . IR US GUIDE VASC ACCESS RIGHT  11/28/2016  . KNEE ARTHROSCOPY Right 11/14/2006  . KNEE ARTHROSCOPY Bilateral 5 and 6 years ago  . KNEE ARTHROSCOPY WITH LATERAL MENISECTOMY  07/03/2012   Procedure: KNEE ARTHROSCOPY WITH LATERAL MENISECTOMY;  Surgeon: Magnus Sinning, MD;  Location: [facility];  Service: Orthopedics;  Laterality: Left;  with Partial Lateral Menisectomy and Medial Menisectomy. Shaving of medial and lateral femoral condyles. Shaving of patella. Removal of a loose body  . tibial and fibular internal fixation Left   . TOTAL ABDOMINAL HYSTERECTOMY  82 years old  . UPPER GASTROINTESTINAL ENDOSCOPY  2009, 2013    REVIEW OF SYSTEMS:  Constitutional: positive for fatigue Eyes: negative Ears, nose, mouth, throat, and face: negative Respiratory: negative Cardiovascular: negative Gastrointestinal: positive for abdominal pain Genitourinary:negative Integument/breast: negative Hematologic/lymphatic: negative Musculoskeletal:positive for muscle weakness Neurological: negative Behavioral/Psych: negative Endocrine: negative Allergic/Immunologic: negative   PHYSICAL EXAMINATION: General appearance: alert, cooperative, fatigued and no distress Head: Normocephalic, without obvious abnormality, atraumatic Neck: no adenopathy, no JVD, supple, symmetrical, trachea midline and thyroid not enlarged, symmetric, no tenderness/mass/nodules Lymph nodes: Cervical, supraclavicular, and axillary nodes normal. Resp: clear to auscultation bilaterally Back: symmetric, no curvature. ROM normal. No CVA tenderness. Cardio: regular rate and rhythm, S1, S2 normal, no murmur, click, rub or gallop GI: soft, non-tender; bowel sounds normal; no masses,  no organomegaly Extremities: extremities normal,  atraumatic, no cyanosis or edema Neurologic: Alert and oriented X 3, normal strength and tone. Normal symmetric reflexes. Normal coordination and gait  ECOG PERFORMANCE STATUS: 1 - Symptomatic but completely ambulatory  Blood pressure (!) 122/48, pulse 72, temperature 98 F (36.7 C), temperature source Oral, resp. rate (!) 21, height _0  (1.626 m), weight 226 lb 9.6 oz (102.8 kg), SpO2 96 %.  LABORATORY DATA: Lab Results  Component Value Date   WBC 3.6 (L) 12/05/2016   HGB 12.5 12/05/2016   HCT 38.4 12/05/2016   MCV 94.0 12/05/2016   PLT 156 12/05/2016      Chemistry      Component Value Date/Time   NA 139 12/05/2016 1049   K 4.5 12/05/2016 1049   CL 102 11/28/2016 0743   CO2 25 12/05/2016 1049   BUN 12.9 12/05/2016 1049   CREATININE 0.9 12/05/2016 1049      Component Value Date/Time   CALCIUM 9.8 12/05/2016 1049   ALKPHOS 78 12/05/2016 1049   AST 17 12/05/2016 1049   ALT 14 12/05/2016 1049   BILITOT 0.50 12/05/2016 1049       RADIOGRAPHIC STUDIES: Nm Liver Img Spect  Result Date: 11/28/2016 CLINICAL DATA:  metastatic malignant neuroendocrine tumor to the liver. EXAM: NUCLEAR MEDICINE LIVER SCAN TECHNIQUE: Abdominal images were obtained in multiple projections after intrahepatic arterial injection of radiopharmaceutical. SPECT imaging was performed. Lung shunt calculation was performed. RADIOPHARMACEUTICALS:  4.8 MCI TC-32M MAA COMPARISON:  11/13/2016 FINDINGS: The injected microaggregated albumin localizes within the liver. There is a area of non target activity identified ventral and caudal to the inferior margin of the liver. Findings may reflect reflux of radiotracer activity into falciform artery. Calculated shunt fraction to the lungs equals 7.8%. IMPRESSION: 1. There is a focal area of non target activity just ventral and caudal to the inferior margin of the liver which may reflect reflux radiotracer activity away from the liver. Findings were discussed with Dr. Pascal Lux  at the time of interpretation. 2. Lung shunt fraction equals 7.8% Electronically Signed   By: Kerby Moors M.D.   On: 11/28/2016 13:20   Ir Angiogram Visceral Selective  Result Date: 11/28/2016 INDICATION: History of metastatic neuroendocrine tumor. Patient presents today for mapping Y 90 Radioembolization. Please refer to formal consultation in the epic EMR on 11/06/2016 for additional details. EXAM: 1. ULTRASOUND GUIDANCE FOR ARTERIAL ACCESS 2. CELIAC AND SUPERIOR MESENTERIC ARTERIOGRAM (1st ORDER) 3. SELECTIVE RIGHT HEPATIC ARTERIOGRAM (3rd ORDER) AND ADMINISTRATION OF Tc73mMAA 4. SELECTIVE RIGHT GASTRIC ARTERIOGRAM (3rd ORDER) AND PERCUTANEOUS COIL EMBOLIZATION. 5. SELECTIVE LEFT HEPATIC ARTERIOGRAM (3rd ORDER) AND ADMINISTRATION OF Tc987mAA COMPARISON:  CTA of the abdomen pelvis -  11/13/2016; MEDICATIONS: None RADIOPHARMACEUTICALS:  2.5 mCi Tc22mMAA administered via the replaced right hepatic artery; 2.5 mCi Tc98mAA administered via the left hepatic artery CONTRAST:  86 cc Isovue 300 ANESTHESIA/SEDATION: Moderate (conscious) sedation was employed during this procedure. A total of Versed 4 mg and Fentanyl 200 mcg was administered intravenously. Moderate Sedation Time: 60 minutes. The patient's level of consciousness and vital signs were monitored continuously by radiology nursing throughout the procedure under my direct supervision. FLUOROSCOPY TIME:  14 minutes 42 seconds (2,185 mGy) ACCESS: Right common femoral artery; hemostasis achieved with manual compression. COMPLICATIONS: None immediate. TECHNIQUE: Informed written consent was obtained from the patient after a discussion of the risks, benefits and alternatives to treatment. Questions regarding the procedure were encouraged and answered. A timeout was performed prior to the initiation of the procedure. The right groin was prepped and draped in the usual sterile fashion, and a sterile drape was applied covering the operative field. Maximum  barrier sterile technique with sterile gowns and gloves were used for the procedure. A timeout was performed prior to the initiation of the procedure. Local anesthesia was provided with 1% lidocaine. The right femoral head was marked fluoroscopically. Under ultrasound guidance, the right common femoral artery was accessed with a micropuncture kit after the overlying soft tissues were anesthetized with 1% lidocaine. An ultrasound image was saved for documentation purposes. The micropuncture sheath was exchanged for a 5 Fr[place]ascular sheath over a Bentson wire. A closure arteriogram was performed through the side of the sheath confirming access within the right common femoral artery. Over a Bentson wire, a Mickelson catheter was advanced to the level of the thoracic aorta where it was back bled and flushed. The catheter was then utilized to select the superior mesenteric artery and a superior mesenteric arteriogram was performed. Utilizing a Fathom 14 microwire, a regular Renegade microcatheter was advanced into the replaced right hepatic artery and a selective right hepatic arteriogram was performed. Next, 2.5 mCi of technetium 99 MAA was administered for this location. The microcatheter was removed from the patient and placed in the radiation receptacle. Next, the Mickelson catheter was then advanced cranially and utilized to select the celiac artery and a celiac arteriogram was performed. Utilizing a combination of a Fathom 14 and transcend microwires, a regular renegade micro catheter was utilized to select the left hepatic artery and a left hepatic arteriogram was performed. Next, the microcatheter was utilized select the right gastric artery. A selective right a gastric arteriogram was performed and the right gastric artery was percutaneously coil embolized with multiple overlapping 2 mm diameter interlock coils. The microcatheter was withdrawn into the left hepatic artery and 8 post embolization arteriogram  was performed. Microcatheter was advanced into the distal aspect of the left hepatic artery and a selective left hepatic arteriogram was performed. Next, 2.5 mCi of technetium 99 MAA was administered for this location. The microcatheter was removed from the patient and placed in the radiation receptacle. At this point, the procedure was terminated. All wires, catheters and sheaths were removed from the patient. Hemostasis was achieved at the right groin and access site with manual compression. A dressing was placed. The patient tolerated procedure well without immediate postprocedural complication. The patient was escorted to nuclear medicine department for planar imaging. FINDINGS: Selective superior mesenteric arteriogram and demonstrates a replaced right hepatic artery arising from the proximal trunk of the SMA as demonstrated on preceding CTA. Selective right hepatic arteriogram was negative for presence of extrahepatic vascular contribution. Approximately  2.5 mCi of technetium 99 MAA was administered for this location. Left hepatic arteriogram confirms contribution to the right gastric artery. Selective percutaneous coil embolization of a right gastric artery to near the vessels origin. Conventional takeoff of the gastroduodenal artery, however the takeoff was remote from the planned administered location of Y 90 and thus NOT embolized. Approximately 2.5 mCi of technetium 99 MAA was administered from the distal aspect of the left hepatic artery. IMPRESSION: 1. Successful pre Y-90 arteriogram with percutaneous coil embolization of the right gastric artery. 2. Non conventional anatomy with a replaced right hepatic artery arising from the proximal SMA. 3. Successful administration of 2.5 mCi of technetium 55mMAA via the replaced right hepatic artery. 4. Successful administration of 2.5 mCi of technetium 918mAA via the left hepatic artery. PLAN: Pending the results of the nuclear medicine liver scan, the patient  will return Y 90 radioembolization of the replaced right hepatic artery. Pending the patient's recovery from this initial treatment session, the patient will ultimately return for radioembolization of the left hepatic artery. Electronically Signed   By: JoSandi Mariscal.D.   On: 11/28/2016 13:09   Ir Angiogram Visceral Selective  Result Date: 11/28/2016 INDICATION: History of metastatic neuroendocrine tumor. Patient presents today for mapping Y 90 Radioembolization. Please refer to formal consultation in the epic EMR on 11/06/2016 for additional details. EXAM: 1. ULTRASOUND GUIDANCE FOR ARTERIAL ACCESS 2. CELIAC AND SUPERIOR MESENTERIC ARTERIOGRAM (1st ORDER) 3. SELECTIVE RIGHT HEPATIC ARTERIOGRAM (3rd ORDER) AND ADMINISTRATION OF Tc9929mA 4. SELECTIVE RIGHT GASTRIC ARTERIOGRAM (3rd ORDER) AND PERCUTANEOUS COIL EMBOLIZATION. 5. SELECTIVE LEFT HEPATIC ARTERIOGRAM (3rd ORDER) AND ADMINISTRATION OF Tc99m79m COMPARISON:  CTA of the abdomen pelvis - 11/13/2016; MEDICATIONS: None RADIOPHARMACEUTICALS:  2.5 mCi Tc99m 75madministered via the replaced right hepatic artery; 2.5 mCi Tc99m M52mdministered via the left hepatic artery CONTRAST:  86 cc Isovue 300 ANESTHESIA/SEDATION: Moderate (conscious) sedation was employed during this procedure. A total of Versed 4 mg and Fentanyl 200 mcg was administered intravenously. Moderate Sedation Time: 60 minutes. The patient's level of consciousness and vital signs were monitored continuously by radiology nursing throughout the procedure under my direct supervision. FLUOROSCOPY TIME:  14 minutes 42 seconds (2,185 mGy) ACCESS: Right common femoral artery; hemostasis achieved with manual compression. COMPLICATIONS: None immediate. TECHNIQUE: Informed written consent was obtained from the patient after a discussion of the risks, benefits and alternatives to treatment. Questions regarding the procedure were encouraged and answered. A timeout was performed prior to the initiation of the  procedure. The right groin was prepped and draped in the usual sterile fashion, and a sterile drape was applied covering the operative field. Maximum barrier sterile technique with sterile gowns and gloves were used for the procedure. A timeout was performed prior to the initiation of the procedure. Local anesthesia was provided with 1% lidocaine. The right femoral head was marked fluoroscopically. Under ultrasound guidance, the right common femoral artery was accessed with a micropuncture kit after the overlying soft tissues were anesthetized with 1% lidocaine. An ultrasound image was saved for documentation purposes. The micropuncture sheath was exchanged for a 5 French[place]lar sheath over a Bentson wire. A closure arteriogram was performed through the side of the sheath confirming access within the right common femoral artery. Over a Bentson wire, a Mickelson catheter was advanced to the level of the thoracic aorta where it was back bled and flushed. The catheter was then utilized to select the superior mesenteric artery and a superior mesenteric arteriogram  was performed. Utilizing a Fathom 14 microwire, a regular Renegade microcatheter was advanced into the replaced right hepatic artery and a selective right hepatic arteriogram was performed. Next, 2.5 mCi of technetium 99 MAA was administered for this location. The microcatheter was removed from the patient and placed in the radiation receptacle. Next, the Mickelson catheter was then advanced cranially and utilized to select the celiac artery and a celiac arteriogram was performed. Utilizing a combination of a Fathom 14 and transcend microwires, a regular renegade micro catheter was utilized to select the left hepatic artery and a left hepatic arteriogram was performed. Next, the microcatheter was utilized select the right gastric artery. A selective right a gastric arteriogram was performed and the right gastric artery was percutaneously coil embolized with  multiple overlapping 2 mm diameter interlock coils. The microcatheter was withdrawn into the left hepatic artery and 8 post embolization arteriogram was performed. Microcatheter was advanced into the distal aspect of the left hepatic artery and a selective left hepatic arteriogram was performed. Next, 2.5 mCi of technetium 99 MAA was administered for this location. The microcatheter was removed from the patient and placed in the radiation receptacle. At this point, the procedure was terminated. All wires, catheters and sheaths were removed from the patient. Hemostasis was achieved at the right groin and access site with manual compression. A dressing was placed. The patient tolerated procedure well without immediate postprocedural complication. The patient was escorted to nuclear medicine department for planar imaging. FINDINGS: Selective superior mesenteric arteriogram and demonstrates a replaced right hepatic artery arising from the proximal trunk of the SMA as demonstrated on preceding CTA. Selective right hepatic arteriogram was negative for presence of extrahepatic vascular contribution. Approximately 2.5 mCi of technetium 99 MAA was administered for this location. Left hepatic arteriogram confirms contribution to the right gastric artery. Selective percutaneous coil embolization of a right gastric artery to near the vessels origin. Conventional takeoff of the gastroduodenal artery, however the takeoff was remote from the planned administered location of Y 90 and thus NOT embolized. Approximately 2.5 mCi of technetium 99 MAA was administered from the distal aspect of the left hepatic artery. IMPRESSION: 1. Successful pre Y-90 arteriogram with percutaneous coil embolization of the right gastric artery. 2. Non conventional anatomy with a replaced right hepatic artery arising from the proximal SMA. 3. Successful administration of 2.5 mCi of technetium 34mMAA via the replaced right hepatic artery. 4. Successful  administration of 2.5 mCi of technetium 92mAA via the left hepatic artery. PLAN: Pending the results of the nuclear medicine liver scan, the patient will return Y 90 radioembolization of the replaced right hepatic artery. Pending the patient's recovery from this initial treatment session, the patient will ultimately return for radioembolization of the left hepatic artery. Electronically Signed   By: JoSandi Mariscal.D.   On: 11/28/2016 13:09   Ir Angiogram Selective Each Additional Vessel  Result Date: 11/28/2016 INDICATION: History of metastatic neuroendocrine tumor. Patient presents today for mapping Y 90 Radioembolization. Please refer to formal consultation in the epic EMR on 11/06/2016 for additional details. EXAM: 1. ULTRASOUND GUIDANCE FOR ARTERIAL ACCESS 2. CELIAC AND SUPERIOR MESENTERIC ARTERIOGRAM (1st ORDER) 3. SELECTIVE RIGHT HEPATIC ARTERIOGRAM (3rd ORDER) AND ADMINISTRATION OF Tc9973mA 4. SELECTIVE RIGHT GASTRIC ARTERIOGRAM (3rd ORDER) AND PERCUTANEOUS COIL EMBOLIZATION. 5. SELECTIVE LEFT HEPATIC ARTERIOGRAM (3rd ORDER) AND ADMINISTRATION OF Tc99m71m COMPARISON:  CTA of the abdomen pelvis - 11/13/2016; MEDICATIONS: None RADIOPHARMACEUTICALS:  2.5 mCi Tc99m 68madministered via the replaced right  hepatic artery; 2.5 mCi Tc74mMAA administered via the left hepatic artery CONTRAST:  86 cc Isovue 300 ANESTHESIA/SEDATION: Moderate (conscious) sedation was employed during this procedure. A total of Versed 4 mg and Fentanyl 200 mcg was administered intravenously. Moderate Sedation Time: 60 minutes. The patient's level of consciousness and vital signs were monitored continuously by radiology nursing throughout the procedure under my direct supervision. FLUOROSCOPY TIME:  14 minutes 42 seconds (2,185 mGy) ACCESS: Right common femoral artery; hemostasis achieved with manual compression. COMPLICATIONS: None immediate. TECHNIQUE: Informed written consent was obtained from the patient after a discussion of the  risks, benefits and alternatives to treatment. Questions regarding the procedure were encouraged and answered. A timeout was performed prior to the initiation of the procedure. The right groin was prepped and draped in the usual sterile fashion, and a sterile drape was applied covering the operative field. Maximum barrier sterile technique with sterile gowns and gloves were used for the procedure. A timeout was performed prior to the initiation of the procedure. Local anesthesia was provided with 1% lidocaine. The right femoral head was marked fluoroscopically. Under ultrasound guidance, the right common femoral artery was accessed with a micropuncture kit after the overlying soft tissues were anesthetized with 1% lidocaine. An ultrasound image was saved for documentation purposes. The micropuncture sheath was exchanged for a 5 [place] sheath over a Bentson wire. A closure arteriogram was performed through the side of the sheath confirming access within the right common femoral artery. Over a Bentson wire, a Mickelson catheter was advanced to the level of the thoracic aorta where it was back bled and flushed. The catheter was then utilized to select the superior mesenteric artery and a superior mesenteric arteriogram was performed. Utilizing a Fathom 14 microwire, a regular Renegade microcatheter was advanced into the replaced right hepatic artery and a selective right hepatic arteriogram was performed. Next, 2.5 mCi of technetium 99 MAA was administered for this location. The microcatheter was removed from the patient and placed in the radiation receptacle. Next, the Mickelson catheter was then advanced cranially and utilized to select the celiac artery and a celiac arteriogram was performed. Utilizing a combination of a Fathom 14 and transcend microwires, a regular renegade micro catheter was utilized to select the left hepatic artery and a left hepatic arteriogram was performed. Next, the microcatheter  was utilized select the right gastric artery. A selective right a gastric arteriogram was performed and the right gastric artery was percutaneously coil embolized with multiple overlapping 2 mm diameter interlock coils. The microcatheter was withdrawn into the left hepatic artery and 8 post embolization arteriogram was performed. Microcatheter was advanced into the distal aspect of the left hepatic artery and a selective left hepatic arteriogram was performed. Next, 2.5 mCi of technetium 99 MAA was administered for this location. The microcatheter was removed from the patient and placed in the radiation receptacle. At this point, the procedure was terminated. All wires, catheters and sheaths were removed from the patient. Hemostasis was achieved at the right groin and access site with manual compression. A dressing was placed. The patient tolerated procedure well without immediate postprocedural complication. The patient was escorted to nuclear medicine department for planar imaging. FINDINGS: Selective superior mesenteric arteriogram and demonstrates a replaced right hepatic artery arising from the proximal trunk of the SMA as demonstrated on preceding CTA. Selective right hepatic arteriogram was negative for presence of extrahepatic vascular contribution. Approximately 2.5 mCi of technetium 99 MAA was administered for this location. Left hepatic arteriogram  confirms contribution to the right gastric artery. Selective percutaneous coil embolization of a right gastric artery to near the vessels origin. Conventional takeoff of the gastroduodenal artery, however the takeoff was remote from the planned administered location of Y 90 and thus NOT embolized. Approximately 2.5 mCi of technetium 99 MAA was administered from the distal aspect of the left hepatic artery. IMPRESSION: 1. Successful pre Y-90 arteriogram with percutaneous coil embolization of the right gastric artery. 2. Non conventional anatomy with a replaced  right hepatic artery arising from the proximal SMA. 3. Successful administration of 2.5 mCi of technetium 55mMAA via the replaced right hepatic artery. 4. Successful administration of 2.5 mCi of technetium 969mAA via the left hepatic artery. PLAN: Pending the results of the nuclear medicine liver scan, the patient will return Y 90 radioembolization of the replaced right hepatic artery. Pending the patient's recovery from this initial treatment session, the patient will ultimately return for radioembolization of the left hepatic artery. Electronically Signed   By: JoSandi Mariscal.D.   On: 11/28/2016 13:09   Ir Angiogram Selective Each Additional Vessel  Result Date: 11/28/2016 INDICATION: History of metastatic neuroendocrine tumor. Patient presents today for mapping Y 90 Radioembolization. Please refer to formal consultation in the epic EMR on 11/06/2016 for additional details. EXAM: 1. ULTRASOUND GUIDANCE FOR ARTERIAL ACCESS 2. CELIAC AND SUPERIOR MESENTERIC ARTERIOGRAM (1st ORDER) 3. SELECTIVE RIGHT HEPATIC ARTERIOGRAM (3rd ORDER) AND ADMINISTRATION OF [id number] 4. SELECTIVE RIGHT GASTRIC ARTERIOGRAM (3rd ORDER) AND PERCUTANEOUS COIL EMBOLIZATION. 5. SELECTIVE LEFT HEPATIC ARTERIOGRAM (3rd ORDER) AND ADMINISTRATION OF Tc99m80m COMPARISON:  CTA of the abdomen pelvis - 11/13/2016; MEDICATIONS: None RADIOPHARMACEUTICALS:  2.5 mCi Tc99m 68madministered via the replaced right hepatic artery; 2.5 mCi Tc99m M1mdministered via the left hepatic artery CONTRAST:  86 cc Isovue 300 ANESTHESIA/SEDATION: Moderate (conscious) sedation was employed during this procedure. A total of Versed 4 mg and Fentanyl 200 mcg was administered intravenously. Moderate Sedation Time: 60 minutes. The patient's level of consciousness and vital signs were monitored continuously by radiology nursing throughout the procedure under my direct supervision. FLUOROSCOPY TIME:  14 minutes 42 seconds (2,185 mGy) ACCESS: Right common femoral artery;  hemostasis achieved with manual compression. COMPLICATIONS: None immediate. TECHNIQUE: Informed written consent was obtained from the patient after a discussion of the risks, benefits and alternatives to treatment. Questions regarding the procedure were encouraged and answered. A timeout was performed prior to the initiation of the procedure. The right groin was prepped and draped in the usual sterile fashion, and a sterile drape was applied covering the operative field. Maximum barrier sterile technique with sterile gowns and gloves were used for the procedure. A timeout was performed prior to the initiation of the procedure. Local anesthesia was provided with 1% lidocaine. The right femoral head was marked fluoroscopically. Under ultrasound guidance, the right common femoral artery was accessed with a micropuncture kit after the overlying soft tissues were anesthetized with 1% lidocaine. An ultrasound image was saved for documentation purposes. The micropuncture sheath was exchanged for a 5 [place] sheath over a Bentson wire. A closure arteriogram was performed through the side of the sheath confirming access within the right common femoral artery. Over a Bentson wire, a Mickelson catheter was advanced to the level of the thoracic aorta where it was back bled and flushed. The catheter was then utilized to select the superior mesenteric artery and a superior mesenteric arteriogram was performed. Utilizing a Fathom 14 microwire, a regular Renegade microcatheter was  advanced into the replaced right hepatic artery and a selective right hepatic arteriogram was performed. Next, 2.5 mCi of technetium 99 MAA was administered for this location. The microcatheter was removed from the patient and placed in the radiation receptacle. Next, the Mickelson catheter was then advanced cranially and utilized to select the celiac artery and a celiac arteriogram was performed. Utilizing a combination of a Fathom 14 and  transcend microwires, a regular renegade micro catheter was utilized to select the left hepatic artery and a left hepatic arteriogram was performed. Next, the microcatheter was utilized select the right gastric artery. A selective right a gastric arteriogram was performed and the right gastric artery was percutaneously coil embolized with multiple overlapping 2 mm diameter interlock coils. The microcatheter was withdrawn into the left hepatic artery and 8 post embolization arteriogram was performed. Microcatheter was advanced into the distal aspect of the left hepatic artery and a selective left hepatic arteriogram was performed. Next, 2.5 mCi of technetium 99 MAA was administered for this location. The microcatheter was removed from the patient and placed in the radiation receptacle. At this point, the procedure was terminated. All wires, catheters and sheaths were removed from the patient. Hemostasis was achieved at the right groin and access site with manual compression. A dressing was placed. The patient tolerated procedure well without immediate postprocedural complication. The patient was escorted to nuclear medicine department for planar imaging. FINDINGS: Selective superior mesenteric arteriogram and demonstrates a replaced right hepatic artery arising from the proximal trunk of the SMA as demonstrated on preceding CTA. Selective right hepatic arteriogram was negative for presence of extrahepatic vascular contribution. Approximately 2.5 mCi of technetium 99 MAA was administered for this location. Left hepatic arteriogram confirms contribution to the right gastric artery. Selective percutaneous coil embolization of a right gastric artery to near the vessels origin. Conventional takeoff of the gastroduodenal artery, however the takeoff was remote from the planned administered location of Y 90 and thus NOT embolized. Approximately 2.5 mCi of technetium 99 MAA was administered from the distal aspect of the left  hepatic artery. IMPRESSION: 1. Successful pre Y-90 arteriogram with percutaneous coil embolization of the right gastric artery. 2. Non conventional anatomy with a replaced right hepatic artery arising from the proximal SMA. 3. Successful administration of 2.5 mCi of technetium 7mMAA via the replaced right hepatic artery. 4. Successful administration of 2.5 mCi of technetium 910mAA via the left hepatic artery. PLAN: Pending the results of the nuclear medicine liver scan, the patient will return Y 90 radioembolization of the replaced right hepatic artery. Pending the patient's recovery from this initial treatment session, the patient will ultimately return for radioembolization of the left hepatic artery. Electronically Signed   By: JoSandi Mariscal.D.   On: 11/28/2016 13:09   Ir Angiogram Selective Each Additional Vessel  Result Date: 11/28/2016 INDICATION: History of metastatic neuroendocrine tumor. Patient presents today for mapping Y 90 Radioembolization. Please refer to formal consultation in the epic EMR on 11/06/2016 for additional details. EXAM: 1. ULTRASOUND GUIDANCE FOR ARTERIAL ACCESS 2. CELIAC AND SUPERIOR MESENTERIC ARTERIOGRAM (1st ORDER) 3. SELECTIVE RIGHT HEPATIC ARTERIOGRAM (3rd ORDER) AND ADMINISTRATION OF [id number] 4. SELECTIVE RIGHT GASTRIC ARTERIOGRAM (3rd ORDER) AND PERCUTANEOUS COIL EMBOLIZATION. 5. SELECTIVE LEFT HEPATIC ARTERIOGRAM (3rd ORDER) AND ADMINISTRATION OF Tc99m63m COMPARISON:  CTA of the abdomen pelvis - 11/13/2016; MEDICATIONS: None RADIOPHARMACEUTICALS:  2.5 mCi Tc99m 79madministered via the replaced right hepatic artery; 2.5 mCi Tc99m M27mdministered via the left hepatic artery  CONTRAST:  86 cc Isovue 300 ANESTHESIA/SEDATION: Moderate (conscious) sedation was employed during this procedure. A total of Versed 4 mg and Fentanyl 200 mcg was administered intravenously. Moderate Sedation Time: 60 minutes. The patient's level of consciousness and vital signs were monitored  continuously by radiology nursing throughout the procedure under my direct supervision. FLUOROSCOPY TIME:  14 minutes 42 seconds (2,185 mGy) ACCESS: Right common femoral artery; hemostasis achieved with manual compression. COMPLICATIONS: None immediate. TECHNIQUE: Informed written consent was obtained from the patient after a discussion of the risks, benefits and alternatives to treatment. Questions regarding the procedure were encouraged and answered. A timeout was performed prior to the initiation of the procedure. The right groin was prepped and draped in the usual sterile fashion, and a sterile drape was applied covering the operative field. Maximum barrier sterile technique with sterile gowns and gloves were used for the procedure. A timeout was performed prior to the initiation of the procedure. Local anesthesia was provided with 1% lidocaine. The right femoral head was marked fluoroscopically. Under ultrasound guidance, the right common femoral artery was accessed with a micropuncture kit after the overlying soft tissues were anesthetized with 1% lidocaine. An ultrasound image was saved for documentation purposes. The micropuncture sheath was exchanged for a 5 [place] vascular sheath over a Bentson wire. A closure arteriogram was performed through the side of the sheath confirming access within the right common femoral artery. Over a Bentson wire, a Mickelson catheter was advanced to the level of the thoracic aorta where it was back bled and flushed. The catheter was then utilized to select the superior mesenteric artery and a superior mesenteric arteriogram was performed. Utilizing a Fathom 14 microwire, a regular Renegade microcatheter was advanced into the replaced right hepatic artery and a selective right hepatic arteriogram was performed. Next, 2.5 mCi of technetium 99 MAA was administered for this location. The microcatheter was removed from the patient and placed in the radiation receptacle. Next, the  Mickelson catheter was then advanced cranially and utilized to select the celiac artery and a celiac arteriogram was performed. Utilizing a combination of a Fathom 14 and transcend microwires, a regular renegade micro catheter was utilized to select the left hepatic artery and a left hepatic arteriogram was performed. Next, the microcatheter was utilized select the right gastric artery. A selective right a gastric arteriogram was performed and the right gastric artery was percutaneously coil embolized with multiple overlapping 2 mm diameter interlock coils. The microcatheter was withdrawn into the left hepatic artery and 8 post embolization arteriogram was performed. Microcatheter was advanced into the distal aspect of the left hepatic artery and a selective left hepatic arteriogram was performed. Next, 2.5 mCi of technetium 99 MAA was administered for this location. The microcatheter was removed from the patient and placed in the radiation receptacle. At this point, the procedure was terminated. All wires, catheters and sheaths were removed from the patient. Hemostasis was achieved at the right groin and access site with manual compression. A dressing was placed. The patient tolerated procedure well without immediate postprocedural complication. The patient was escorted to nuclear medicine department for planar imaging. FINDINGS: Selective superior mesenteric arteriogram and demonstrates a replaced right hepatic artery arising from the proximal trunk of the SMA as demonstrated on preceding CTA. Selective right hepatic arteriogram was negative for presence of extrahepatic vascular contribution. Approximately 2.5 mCi of technetium 99 MAA was administered for this location. Left hepatic arteriogram confirms contribution to the right gastric artery. Selective percutaneous coil embolization of  a right gastric artery to near the vessels origin. Conventional takeoff of the gastroduodenal artery, however the takeoff was  remote from the planned administered location of Y 90 and thus NOT embolized. Approximately 2.5 mCi of technetium 99 MAA was administered from the distal aspect of the left hepatic artery. IMPRESSION: 1. Successful pre Y-90 arteriogram with percutaneous coil embolization of the right gastric artery. 2. Non conventional anatomy with a replaced right hepatic artery arising from the proximal SMA. 3. Successful administration of 2.5 mCi of technetium 31mMAA via the replaced right hepatic artery. 4. Successful administration of 2.5 mCi of technetium 970mAA via the left hepatic artery. PLAN: Pending the results of the nuclear medicine liver scan, the patient will return Y 90 radioembolization of the replaced right hepatic artery. Pending the patient's recovery from this initial treatment session, the patient will ultimately return for radioembolization of the left hepatic artery. Electronically Signed   By: JoSandi Mariscal.D.   On: 11/28/2016 13:09   Ir Angiogram Selective Each Additional Vessel  Result Date: 11/28/2016 INDICATION: History of metastatic neuroendocrine tumor. Patient presents today for mapping Y 90 Radioembolization. Please refer to formal consultation in the epic EMR on 11/06/2016 for additional details. EXAM: 1. ULTRASOUND GUIDANCE FOR ARTERIAL ACCESS 2. CELIAC AND SUPERIOR MESENTERIC ARTERIOGRAM (1st ORDER) 3. SELECTIVE RIGHT HEPATIC ARTERIOGRAM (3rd ORDER) AND ADMINISTRATION OF [id number] 4. SELECTIVE RIGHT GASTRIC ARTERIOGRAM (3rd ORDER) AND PERCUTANEOUS COIL EMBOLIZATION. 5. SELECTIVE LEFT HEPATIC ARTERIOGRAM (3rd ORDER) AND ADMINISTRATION OF Tc99m66m COMPARISON:  CTA of the abdomen pelvis - 11/13/2016; MEDICATIONS: None RADIOPHARMACEUTICALS:  2.5 mCi Tc99m 69madministered via the replaced right hepatic artery; 2.5 mCi Tc99m M30mdministered via the left hepatic artery CONTRAST:  86 cc Isovue 300 ANESTHESIA/SEDATION: Moderate (conscious) sedation was employed during this procedure. A total of  Versed 4 mg and Fentanyl 200 mcg was administered intravenously. Moderate Sedation Time: 60 minutes. The patient's level of consciousness and vital signs were monitored continuously by radiology nursing throughout the procedure under my direct supervision. FLUOROSCOPY TIME:  14 minutes 42 seconds (2,185 mGy) ACCESS: Right common femoral artery; hemostasis achieved with manual compression. COMPLICATIONS: None immediate. TECHNIQUE: Informed written consent was obtained from the patient after a discussion of the risks, benefits and alternatives to treatment. Questions regarding the procedure were encouraged and answered. A timeout was performed prior to the initiation of the procedure. The right groin was prepped and draped in the usual sterile fashion, and a sterile drape was applied covering the operative field. Maximum barrier sterile technique with sterile gowns and gloves were used for the procedure. A timeout was performed prior to the initiation of the procedure. Local anesthesia was provided with 1% lidocaine. The right femoral head was marked fluoroscopically. Under ultrasound guidance, the right common femoral artery was accessed with a micropuncture kit after the overlying soft tissues were anesthetized with 1% lidocaine. An ultrasound image was saved for documentation purposes. The micropuncture sheath was exchanged for a 5 [place] sheath over a Bentson wire. A closure arteriogram was performed through the side of the sheath confirming access within the right common femoral artery. Over a Bentson wire, a Mickelson catheter was advanced to the level of the thoracic aorta where it was back bled and flushed. The catheter was then utilized to select the superior mesenteric artery and a superior mesenteric arteriogram was performed. Utilizing a Fathom 14 microwire, a regular Renegade microcatheter was advanced into the replaced right hepatic artery and a selective right hepatic  arteriogram was performed.  Next, 2.5 mCi of technetium 99 MAA was administered for this location. The microcatheter was removed from the patient and placed in the radiation receptacle. Next, the Mickelson catheter was then advanced cranially and utilized to select the celiac artery and a celiac arteriogram was performed. Utilizing a combination of a Fathom 14 and transcend microwires, a regular renegade micro catheter was utilized to select the left hepatic artery and a left hepatic arteriogram was performed. Next, the microcatheter was utilized select the right gastric artery. A selective right a gastric arteriogram was performed and the right gastric artery was percutaneously coil embolized with multiple overlapping 2 mm diameter interlock coils. The microcatheter was withdrawn into the left hepatic artery and 8 post embolization arteriogram was performed. Microcatheter was advanced into the distal aspect of the left hepatic artery and a selective left hepatic arteriogram was performed. Next, 2.5 mCi of technetium 99 MAA was administered for this location. The microcatheter was removed from the patient and placed in the radiation receptacle. At this point, the procedure was terminated. All wires, catheters and sheaths were removed from the patient. Hemostasis was achieved at the right groin and access site with manual compression. A dressing was placed. The patient tolerated procedure well without immediate postprocedural complication. The patient was escorted to nuclear medicine department for planar imaging. FINDINGS: Selective superior mesenteric arteriogram and demonstrates a replaced right hepatic artery arising from the proximal trunk of the SMA as demonstrated on preceding CTA. Selective right hepatic arteriogram was negative for presence of extrahepatic vascular contribution. Approximately 2.5 mCi of technetium 99 MAA was administered for this location. Left hepatic arteriogram confirms contribution to the right gastric artery.  Selective percutaneous coil embolization of a right gastric artery to near the vessels origin. Conventional takeoff of the gastroduodenal artery, however the takeoff was remote from the planned administered location of Y 90 and thus NOT embolized. Approximately 2.5 mCi of technetium 99 MAA was administered from the distal aspect of the left hepatic artery. IMPRESSION: 1. Successful pre Y-90 arteriogram with percutaneous coil embolization of the right gastric artery. 2. Non conventional anatomy with a replaced right hepatic artery arising from the proximal SMA. 3. Successful administration of 2.5 mCi of technetium 24mMAA via the replaced right hepatic artery. 4. Successful administration of 2.5 mCi of technetium 982mAA via the left hepatic artery. PLAN: Pending the results of the nuclear medicine liver scan, the patient will return Y 90 radioembolization of the replaced right hepatic artery. Pending the patient's recovery from this initial treatment session, the patient will ultimately return for radioembolization of the left hepatic artery. Electronically Signed   By: JoSandi Mariscal.D.   On: 11/28/2016 13:09   Ir [place] Vasc Access Right  Result Date: 11/28/2016 INDICATION: History of metastatic neuroendocrine tumor. Patient presents today for mapping Y 90 Radioembolization. Please refer to formal consultation in the epic EMR on 11/06/2016 for additional details. EXAM: 1. ULTRASOUND GUIDANCE FOR ARTERIAL ACCESS 2. CELIAC AND SUPERIOR MESENTERIC ARTERIOGRAM (1st ORDER) 3. SELECTIVE RIGHT HEPATIC ARTERIOGRAM (3rd ORDER) AND ADMINISTRATION OF [id number] 4. SELECTIVE RIGHT GASTRIC ARTERIOGRAM (3rd ORDER) AND PERCUTANEOUS COIL EMBOLIZATION. 5. SELECTIVE LEFT HEPATIC ARTERIOGRAM (3rd ORDER) AND ADMINISTRATION OF Tc99m70m COMPARISON:  CTA of the abdomen pelvis - 11/13/2016; MEDICATIONS: None RADIOPHARMACEUTICALS:  2.5 mCi Tc99m 83madministered via the replaced right hepatic artery; 2.5 mCi Tc99m M50mdministered via  the left hepatic artery CONTRAST:  86 cc Isovue 300 ANESTHESIA/SEDATION: Moderate (conscious) sedation was  employed during this procedure. A total of Versed 4 mg and Fentanyl 200 mcg was administered intravenously. Moderate Sedation Time: 60 minutes. The patient's level of consciousness and vital signs were monitored continuously by radiology nursing throughout the procedure under my direct supervision. FLUOROSCOPY TIME:  14 minutes 42 seconds (2,185 mGy) ACCESS: Right common femoral artery; hemostasis achieved with manual compression. COMPLICATIONS: None immediate. TECHNIQUE: Informed written consent was obtained from the patient after a discussion of the risks, benefits and alternatives to treatment. Questions regarding the procedure were encouraged and answered. A timeout was performed prior to the initiation of the procedure. The right groin was prepped and draped in the usual sterile fashion, and a sterile drape was applied covering the operative field. Maximum barrier sterile technique with sterile gowns and gloves were used for the procedure. A timeout was performed prior to the initiation of the procedure. Local anesthesia was provided with 1% lidocaine. The right femoral head was marked fluoroscopically. Under ultrasound guidance, the right common femoral artery was accessed with a micropuncture kit after the overlying soft tissues were anesthetized with 1% lidocaine. An ultrasound image was saved for documentation purposes. The micropuncture sheath was exchanged for a 5 [place] vascular sheath over a Bentson wire. A closure arteriogram was performed through the side of the sheath confirming access within the right common femoral artery. Over a Bentson wire, a Mickelson catheter was advanced to the level of the thoracic aorta where it was back bled and flushed. The catheter was then utilized to select the superior mesenteric artery and a superior mesenteric arteriogram was performed. Utilizing a Fathom 14  microwire, a regular Renegade microcatheter was advanced into the replaced right hepatic artery and a selective right hepatic arteriogram was performed. Next, 2.5 mCi of technetium 99 MAA was administered for this location. The microcatheter was removed from the patient and placed in the radiation receptacle. Next, the Mickelson catheter was then advanced cranially and utilized to select the celiac artery and a celiac arteriogram was performed. Utilizing a combination of a Fathom 14 and transcend microwires, a regular renegade micro catheter was utilized to select the left hepatic artery and a left hepatic arteriogram was performed. Next, the microcatheter was utilized select the right gastric artery. A selective right a gastric arteriogram was performed and the right gastric artery was percutaneously coil embolized with multiple overlapping 2 mm diameter interlock coils. The microcatheter was withdrawn into the left hepatic artery and 8 post embolization arteriogram was performed. Microcatheter was advanced into the distal aspect of the left hepatic artery and a selective left hepatic arteriogram was performed. Next, 2.5 mCi of technetium 99 MAA was administered for this location. The microcatheter was removed from the patient and placed in the radiation receptacle. At this point, the procedure was terminated. All wires, catheters and sheaths were removed from the patient. Hemostasis was achieved at the right groin and access site with manual compression. A dressing was placed. The patient tolerated procedure well without immediate postprocedural complication. The patient was escorted to nuclear medicine department for planar imaging. FINDINGS: Selective superior mesenteric arteriogram and demonstrates a replaced right hepatic artery arising from the proximal trunk of the SMA as demonstrated on preceding CTA. Selective right hepatic arteriogram was negative for presence of extrahepatic vascular contribution.  Approximately 2.5 mCi of technetium 99 MAA was administered for this location. Left hepatic arteriogram confirms contribution to the right gastric artery. Selective percutaneous coil embolization of a right gastric artery to near the vessels origin. Conventional takeoff  of the gastroduodenal artery, however the takeoff was remote from the planned administered location of Y 90 and thus NOT embolized. Approximately 2.5 mCi of technetium 99 MAA was administered from the distal aspect of the left hepatic artery. IMPRESSION: 1. Successful pre Y-90 arteriogram with percutaneous coil embolization of the right gastric artery. 2. Non conventional anatomy with a replaced right hepatic artery arising from the proximal SMA. 3. Successful administration of 2.5 mCi of technetium 44mMAA via the replaced right hepatic artery. 4. Successful administration of 2.5 mCi of technetium 939mAA via the left hepatic artery. PLAN: Pending the results of the nuclear medicine liver scan, the patient will return Y 90 radioembolization of the replaced right hepatic artery. Pending the patient's recovery from this initial treatment session, the patient will ultimately return for radioembolization of the left hepatic artery. Electronically Signed   By: JoSandi Mariscal.D.   On: 11/28/2016 13:09   Ir Embo Arterial Not HeShelbyoadmapping  Result Date: 11/28/2016 INDICATION: History of metastatic neuroendocrine tumor. Patient presents today for mapping Y 90 Radioembolization. Please refer to formal consultation in the epic EMR on 11/06/2016 for additional details. EXAM: 1. ULTRASOUND GUIDANCE FOR ARTERIAL ACCESS 2. CELIAC AND SUPERIOR MESENTERIC ARTERIOGRAM (1st ORDER) 3. SELECTIVE RIGHT HEPATIC ARTERIOGRAM (3rd ORDER) AND ADMINISTRATION OF [id number] 4. SELECTIVE RIGHT GASTRIC ARTERIOGRAM (3rd ORDER) AND PERCUTANEOUS COIL EMBOLIZATION. 5. SELECTIVE LEFT HEPATIC ARTERIOGRAM (3rd ORDER) AND ADMINISTRATION OF Tc99m62m COMPARISON:  CTA  of the abdomen pelvis - 11/13/2016; MEDICATIONS: None RADIOPHARMACEUTICALS:  2.5 mCi Tc99m 81madministered via the replaced right hepatic artery; 2.5 mCi Tc99m M56mdministered via the left hepatic artery CONTRAST:  86 cc Isovue 300 ANESTHESIA/SEDATION: Moderate (conscious) sedation was employed during this procedure. A total of Versed 4 mg and Fentanyl 200 mcg was administered intravenously. Moderate Sedation Time: 60 minutes. The patient's level of consciousness and vital signs were monitored continuously by radiology nursing throughout the procedure under my direct supervision. FLUOROSCOPY TIME:  14 minutes 42 seconds (2,185 mGy) ACCESS: Right common femoral artery; hemostasis achieved with manual compression. COMPLICATIONS: None immediate. TECHNIQUE: Informed written consent was obtained from the patient after a discussion of the risks, benefits and alternatives to treatment. Questions regarding the procedure were encouraged and answered. A timeout was performed prior to the initiation of the procedure. The right groin was prepped and draped in the usual sterile fashion, and a sterile drape was applied covering the operative field. Maximum barrier sterile technique with sterile gowns and gloves were used for the procedure. A timeout was performed prior to the initiation of the procedure. Local anesthesia was provided with 1% lidocaine. The right femoral head was marked fluoroscopically. Under ultrasound guidance, the right common femoral artery was accessed with a micropuncture kit after the overlying soft tissues were anesthetized with 1% lidocaine. An ultrasound image was saved for documentation purposes. The micropuncture sheath was exchanged for a 5 [place] sheath over a Bentson wire. A closure arteriogram was performed through the side of the sheath confirming access within the right common femoral artery. Over a Bentson wire, a Mickelson catheter was advanced to the level of the thoracic aorta  where it was back bled and flushed. The catheter was then utilized to select the superior mesenteric artery and a superior mesenteric arteriogram was performed. Utilizing a Fathom 14 microwire, a regular Renegade microcatheter was advanced into the replaced right hepatic artery and a selective right hepatic arteriogram was performed. Next, 2.5 mCi of technetium  99 MAA was administered for this location. The microcatheter was removed from the patient and placed in the radiation receptacle. Next, the Mickelson catheter was then advanced cranially and utilized to select the celiac artery and a celiac arteriogram was performed. Utilizing a combination of a Fathom 14 and transcend microwires, a regular renegade micro catheter was utilized to select the left hepatic artery and a left hepatic arteriogram was performed. Next, the microcatheter was utilized select the right gastric artery. A selective right a gastric arteriogram was performed and the right gastric artery was percutaneously coil embolized with multiple overlapping 2 mm diameter interlock coils. The microcatheter was withdrawn into the left hepatic artery and 8 post embolization arteriogram was performed. Microcatheter was advanced into the distal aspect of the left hepatic artery and a selective left hepatic arteriogram was performed. Next, 2.5 mCi of technetium 99 MAA was administered for this location. The microcatheter was removed from the patient and placed in the radiation receptacle. At this point, the procedure was terminated. All wires, catheters and sheaths were removed from the patient. Hemostasis was achieved at the right groin and access site with manual compression. A dressing was placed. The patient tolerated procedure well without immediate postprocedural complication. The patient was escorted to nuclear medicine department for planar imaging. FINDINGS: Selective superior mesenteric arteriogram and demonstrates a replaced right hepatic artery  arising from the proximal trunk of the SMA as demonstrated on preceding CTA. Selective right hepatic arteriogram was negative for presence of extrahepatic vascular contribution. Approximately 2.5 mCi of technetium 99 MAA was administered for this location. Left hepatic arteriogram confirms contribution to the right gastric artery. Selective percutaneous coil embolization of a right gastric artery to near the vessels origin. Conventional takeoff of the gastroduodenal artery, however the takeoff was remote from the planned administered location of Y 90 and thus NOT embolized. Approximately 2.5 mCi of technetium 99 MAA was administered from the distal aspect of the left hepatic artery. IMPRESSION: 1. Successful pre Y-90 arteriogram with percutaneous coil embolization of the right gastric artery. 2. Non conventional anatomy with a replaced right hepatic artery arising from the proximal SMA. 3. Successful administration of 2.5 mCi of technetium 23mMAA via the replaced right hepatic artery. 4. Successful administration of 2.5 mCi of technetium 956mAA via the left hepatic artery. PLAN: Pending the results of the nuclear medicine liver scan, the patient will return Y 90 radioembolization of the replaced right hepatic artery. Pending the patient's recovery from this initial treatment session, the patient will ultimately return for radioembolization of the left hepatic artery. Electronically Signed   By: JoSandi Mariscal.D.   On: 11/28/2016 13:09   Ct Angio Abd/pel W/ And/or W/o  Result Date: 11/14/2016 CLINICAL DATA:  Neuroendocrine cancer with liver metastasis. Evaluate anatomy prior to potential radioembolization procedure. EXAM: CT ANGIOGRAPHY ABDOMEN AND PELVIS WITH CONTRAST AND WITHOUT CONTRAST TECHNIQUE: Multidetector CT imaging of the abdomen and pelvis was performed using the standard protocol during bolus administration of intravenous contrast. Multiplanar reconstructed images and MIPs were obtained and reviewed  to evaluate the vascular anatomy. CONTRAST:  100 mL Isovue 370 COMPARISON:  CT 10/29/2016 and 02/19/2008 FINDINGS: VASCULAR Aorta: Atherosclerotic disease and calcifications in the abdominal aorta without aneurysm or dissection. Celiac: Small amount of calcified plaque at the origin of the celiac trunk without any significant stenosis. Main branch vessels of the celiac trunk are the left gastric artery, splenic artery and common hepatic artery. Common hepatic artery terminates into the gastroduodenal artery and  left hepatic artery. Right hepatic artery originates from the SMA. The right gastric artery comes off the left hepatic artery and best seen on the coronal reformats, sequence 7, image 40. Right gastric artery is also visualized on sequence 4, image 34 on the axial images. SMA: Atherosclerotic calcifications at the SMA origin without significant stenosis. Replaced right hepatic artery comes off the SMA. Main SMA branches are patent. Renals: Single bilateral renal arteries. Calcified plaque at the origin of the renal arteries with mild right renal artery stenosis. No significant left renal artery stenosis. IMA: IMA is patent. Inflow: Atherosclerotic calcifications in the common iliac arteries without significant stenosis. External iliac arteries are patent. Veins: Iliac veins, IVC and renal veins are patent. Portal venous system is patent. Review of the MIP images confirms the above findings. NON-VASCULAR Lower chest: Again noted is a 3 mm nodule in the left lower lobe on sequence 3, image 4. No pleural effusions. Hepatobiliary: There is a slightly hypodense lesion in the anterior right hepatic lobe, segment 8, that measures up to 3.2 cm and stable from the recent comparison examination. Again noted is a hypodense lesion in the lateral left hepatic lobe, segments 3, measuring up to 2.2 cm. Lesion at the left hepatic dome, segment 2, measuring up to 1.5 cm. There is an additional lesion in the posterior right  hepatic lobe adjacent to the gallbladder measuring 0.9 cm. No intrahepatic biliary dilatation. Pancreas: Subtle soft tissue fullness at the tail of pancreas. Parenchymal fullness is slightly different than the remainder the pancreas and this area also corresponds with hypermetabolic activity from a previous PET-CT on 10/02/2016. This is concerning for a primary pancreatic lesion. No surrounding inflammatory changes and no duct dilatation in the pancreas. Spleen: Normal appearance of spleen without enlargement. Adrenals/Urinary Tract: Normal adrenal glands. Tiny stone in the left kidney lower pole. No hydronephrosis. Probable small cortical cyst in the right kidney interpolar region. Urinary bladder is unremarkable. Stomach/Bowel: No evidence for bowel dilatation or obstruction. No evidence for focal bowel inflammation. Lymphatic: No significant lymph node enlargement in the abdomen pelvis. No evidence for a mesenteric mass. Reproductive: Uterus has been removed. Stable low-density structure in the right adnexa which could represent a small cyst. Other: No free fluid.  No free air. Musculoskeletal: No suspicious bone lesions. IMPRESSION: VASCULAR Variant hepatic anatomy. Replaced right hepatic artery coming off the SMA. Right gastric artery originates from the left hepatic artery. NON-VASCULAR Hepatic metastasis.  There are at least four hepatic lesions. Fullness in the pancreatic tail may represent a primary pancreatic lesion and corresponding with the previous PET findings. The hepatic lesions are not FDG avid on the previous PET-CT. Consider Ga 68-DOTATATE imaging to assess and follow-up liver directed therapy. Small cystic structure in the right adnexa. This has not significantly changed since 02/19/2008 and likely benign. Tiny nonobstructive left kidney stone. Stable 3 mm pulmonary nodule in left lower lobe. Electronically Signed   By: Markus Daft M.D.   On: 11/14/2016 13:35   Nm Fusion  Result Date:  11/28/2016 CLINICAL DATA:  Metastatic neuroendocrine tumor to the liver. EXAM: ULTRASOUND MISCELLANEOUS SOFT TISSUE TECHNIQUE: Abdominal images were obtained in multiple projections after intrahepatic arterial injection of radiopharmaceutical. SPECT imaging was performed. Lung shunt calculation was performed. RADIOPHARMACEUTICALS:  4.8 MCI TC-75M MAA COMPARISON:  11/13/2016 FINDINGS: The injected microaggregated albumin localizes within the liver. There is a area of non target activity identified ventral and caudal to the inferior margin of the liver. Findings may reflect reflux of  radiotracer activity into falciform artery. Calculated shunt fraction to the lungs equals 7.8%. IMPRESSION: 1. There is a focal area of non target activity just ventral and caudal to the inferior margin of the liver which may reflect reflux radiotracer activity away from the liver. Findings were discussed with Dr. Pascal Lux at the time of interpretation. 2. Lung shunt fraction equals 7.8% Electronically Signed   By: Kerby Moors M.D.   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 The patient is currently on systemic chemotherapy with Xeloda and Temodar status post 2 cycles and has been tolerating her treatment well. She is expected to start cycle #3 on 12/07/2016. I recommended for the patient to continue her treatment as a scheduled. She is expected to undergo the Y 90 treatment on 12/13/2016. For the abdominal pain, the patient was given a refill for Percocet today. She was also advised to take Compazine if needed for nausea. I will see her back for follow-up visit in 4 weeks for reevaluation before starting cycle #4 of her treatment with Xeloda and Temodar. The patient was advised to call immediately if she has any concerning symptoms in  the interval. The patient voices understanding of current disease status and treatment options and is in agreement with the current care plan. All questions were answered. The patient knows to call the clinic with any problems, questions or concerns. We can certainly see the patient much sooner if necessary. I spent 15 minutes counseling the patient face to face. The total time spent in the appointment was 25 minutes.  Disclaimer: This note was dictated with voice recognition software. Similar sounding words can inadvertently be transcribed and may not be corrected upon review.

## 2016-12-09 LAB — CUP PACEART REMOTE DEVICE CHECK
Implantable Pulse Generator Implant Date: 20160609
MDC IDC SESS DTM: 20180430183720

## 2016-12-09 NOTE — Progress Notes (Signed)
Carelink summary report received. Battery status OK. Normal device function. No new symptom episodes, tachy episodes, brady, or pause episodes. No new AF episodes. Monthly summary reports and ROV/PRN 

## 2016-12-12 ENCOUNTER — Telehealth: Payer: Self-pay | Admitting: Cardiology

## 2016-12-12 ENCOUNTER — Other Ambulatory Visit: Payer: Self-pay | Admitting: Radiology

## 2016-12-12 NOTE — Patient Instructions (Signed)
Check in to Radiology at 0730, npo after mdnt, have driver

## 2016-12-12 NOTE — Telephone Encounter (Signed)
LMOVM requesting that pt send manual transmission b/c home monitor has not updated in at least 14 days.    

## 2016-12-13 ENCOUNTER — Encounter (HOSPITAL_COMMUNITY)
Admission: RE | Admit: 2016-12-13 | Discharge: 2016-12-13 | Disposition: A | Discharge status: Home / Self Care | Payer: Medicare Other | Source: Ambulatory Visit | Attending: Interventional Radiology | Admitting: Interventional Radiology

## 2016-12-13 ENCOUNTER — Other Ambulatory Visit (HOSPITAL_COMMUNITY): Payer: Self-pay | Admitting: Interventional Radiology

## 2016-12-13 ENCOUNTER — Other Ambulatory Visit: Payer: Self-pay

## 2016-12-13 ENCOUNTER — Observation Stay (HOSPITAL_COMMUNITY)
Admission: RE | Admit: 2016-12-13 | Discharge: 2016-12-14 | Disposition: A | Discharge status: Home / Self Care | Payer: Medicare Other | Source: Ambulatory Visit | Attending: Internal Medicine | Admitting: Internal Medicine

## 2016-12-13 ENCOUNTER — Ambulatory Visit (HOSPITAL_COMMUNITY)
Admission: RE | Admit: 2016-12-13 | Discharge: 2016-12-13 | Disposition: A | Discharge status: Home / Self Care | Payer: Medicare Other | Source: Ambulatory Visit | Attending: Interventional Radiology | Admitting: Interventional Radiology

## 2016-12-13 ENCOUNTER — Inpatient Hospital Stay (HOSPITAL_COMMUNITY): Admission: EM | Admit: 2016-12-13 | Payer: Self-pay | Source: Ambulatory Visit | Admitting: Interventional Radiology

## 2016-12-13 ENCOUNTER — Ambulatory Visit (HOSPITAL_COMMUNITY)
Admission: RE | Admit: 2016-12-13 | Discharge: 2016-12-13 | Disposition: A | Discharge status: Home / Self Care | Payer: Medicare Other | Source: Ambulatory Visit | Attending: Radiology | Admitting: Radiology

## 2016-12-13 ENCOUNTER — Encounter (HOSPITAL_COMMUNITY): Payer: Self-pay

## 2016-12-13 VITALS — BP 142/56 | HR 97 | Temp 97.4°F | Resp 20 | Ht 64.0 in | Wt 224.2 lb

## 2016-12-13 DIAGNOSIS — Z993 Dependence on wheelchair: Secondary | ICD-10-CM | POA: Diagnosis not present

## 2016-12-13 DIAGNOSIS — C7B02 Secondary carcinoid tumors of liver: Secondary | ICD-10-CM | POA: Insufficient documentation

## 2016-12-13 DIAGNOSIS — Z87440 Personal history of urinary (tract) infections: Secondary | ICD-10-CM | POA: Diagnosis not present

## 2016-12-13 DIAGNOSIS — K589 Irritable bowel syndrome without diarrhea: Secondary | ICD-10-CM | POA: Diagnosis not present

## 2016-12-13 DIAGNOSIS — Z87891 Personal history of nicotine dependence: Secondary | ICD-10-CM | POA: Diagnosis not present

## 2016-12-13 DIAGNOSIS — G319 Degenerative disease of nervous system, unspecified: Secondary | ICD-10-CM | POA: Insufficient documentation

## 2016-12-13 DIAGNOSIS — C7A8 Other malignant neuroendocrine tumors: Secondary | ICD-10-CM | POA: Diagnosis present

## 2016-12-13 DIAGNOSIS — F329 Major depressive disorder, single episode, unspecified: Secondary | ICD-10-CM | POA: Diagnosis not present

## 2016-12-13 DIAGNOSIS — K219 Gastro-esophageal reflux disease without esophagitis: Secondary | ICD-10-CM | POA: Diagnosis not present

## 2016-12-13 DIAGNOSIS — E7800 Pure hypercholesterolemia, unspecified: Secondary | ICD-10-CM | POA: Diagnosis not present

## 2016-12-13 DIAGNOSIS — G40909 Epilepsy, unspecified, not intractable, without status epilepticus: Secondary | ICD-10-CM | POA: Insufficient documentation

## 2016-12-13 DIAGNOSIS — M549 Dorsalgia, unspecified: Secondary | ICD-10-CM

## 2016-12-13 DIAGNOSIS — I10 Essential (primary) hypertension: Secondary | ICD-10-CM | POA: Insufficient documentation

## 2016-12-13 DIAGNOSIS — C7B8 Other secondary neuroendocrine tumors: Secondary | ICD-10-CM

## 2016-12-13 DIAGNOSIS — E039 Hypothyroidism, unspecified: Secondary | ICD-10-CM | POA: Diagnosis not present

## 2016-12-13 DIAGNOSIS — Z6838 Body mass index (BMI) 38.0-38.9, adult: Secondary | ICD-10-CM | POA: Insufficient documentation

## 2016-12-13 DIAGNOSIS — I480 Paroxysmal atrial fibrillation: Secondary | ICD-10-CM | POA: Diagnosis not present

## 2016-12-13 DIAGNOSIS — R079 Chest pain, unspecified: Secondary | ICD-10-CM | POA: Diagnosis present

## 2016-12-13 DIAGNOSIS — I319 Disease of pericardium, unspecified: Secondary | ICD-10-CM | POA: Diagnosis not present

## 2016-12-13 DIAGNOSIS — Z7951 Long term (current) use of inhaled steroids: Secondary | ICD-10-CM | POA: Diagnosis not present

## 2016-12-13 DIAGNOSIS — R32 Unspecified urinary incontinence: Secondary | ICD-10-CM | POA: Diagnosis not present

## 2016-12-13 DIAGNOSIS — M4692 Unspecified inflammatory spondylopathy, cervical region: Secondary | ICD-10-CM | POA: Diagnosis not present

## 2016-12-13 DIAGNOSIS — F419 Anxiety disorder, unspecified: Secondary | ICD-10-CM | POA: Diagnosis not present

## 2016-12-13 DIAGNOSIS — Z1239 Encounter for other screening for malignant neoplasm of breast: Secondary | ICD-10-CM

## 2016-12-13 DIAGNOSIS — C7989 Secondary malignant neoplasm of other specified sites: Secondary | ICD-10-CM | POA: Diagnosis not present

## 2016-12-13 DIAGNOSIS — Z8673 Personal history of transient ischemic attack (TIA), and cerebral infarction without residual deficits: Secondary | ICD-10-CM | POA: Insufficient documentation

## 2016-12-13 HISTORY — PX: IR ANGIOGRAM VISCERAL SELECTIVE: IMG657

## 2016-12-13 HISTORY — PX: IR ANGIOGRAM SELECTIVE EACH ADDITIONAL VESSEL: IMG667

## 2016-12-13 HISTORY — PX: IR EMBO TUMOR ORGAN ISCHEMIA INFARCT INC GUIDE ROADMAPPING: IMG5449

## 2016-12-13 HISTORY — PX: IR US GUIDE VASC ACCESS RIGHT: IMG2390

## 2016-12-13 HISTORY — DX: Dorsalgia, unspecified: M54.9

## 2016-12-13 HISTORY — DX: Chest pain, unspecified: R07.9

## 2016-12-13 LAB — TROPONIN I

## 2016-12-13 LAB — CBC WITH DIFFERENTIAL/PLATELET
Basophils Absolute: 0 10*3/uL (ref 0.0–0.1)
Basophils Relative: 1 %
EOS ABS: 0.3 10*3/uL (ref 0.0–0.7)
EOS PCT: 6 %
HEMATOCRIT: 40.8 % (ref 36.0–46.0)
Hemoglobin: 13.2 g/dL (ref 12.0–15.0)
LYMPHS ABS: 1.4 10*3/uL (ref 0.7–4.0)
LYMPHS PCT: 34 %
MCH: 30.6 pg (ref 26.0–34.0)
MCHC: 32.4 g/dL (ref 30.0–36.0)
MCV: 94.4 fL (ref 78.0–100.0)
Monocytes Absolute: 0.7 10*3/uL (ref 0.1–1.0)
Monocytes Relative: 18 %
Neutro Abs: 1.7 10*3/uL (ref 1.7–7.7)
Neutrophils Relative %: 41 %
Platelets: 175 10*3/uL (ref 150–400)
RBC: 4.32 MIL/uL (ref 3.87–5.11)
RDW: 16.8 % — AB (ref 11.5–15.5)
WBC: 4.1 10*3/uL (ref 4.0–10.5)

## 2016-12-13 LAB — COMPREHENSIVE METABOLIC PANEL
ALBUMIN: 4.1 g/dL (ref 3.5–5.0)
ALT: 15 U/L (ref 14–54)
AST: 26 U/L (ref 15–41)
Alkaline Phosphatase: 72 U/L (ref 38–126)
Anion gap: 8 (ref 5–15)
BUN: 15 mg/dL (ref 6–20)
CO2: 27 mmol/L (ref 22–32)
Calcium: 9.9 mg/dL (ref 8.9–10.3)
Chloride: 101 mmol/L (ref 101–111)
Creatinine, Ser: 0.95 mg/dL (ref 0.44–1.00)
GFR calc Af Amer: >60 mL/min (ref >60)
GFR calc non Af Amer: 55 mL/min — ABNORMAL LOW (ref >60)
GLUCOSE: 100 mg/dL — AB (ref 65–99)
Potassium: 4.3 mmol/L (ref 3.5–5.1)
Sodium: 136 mmol/L (ref 135–145)
TOTAL PROTEIN: 7.6 g/dL (ref 6.5–8.1)
Total Bilirubin: 0.8 mg/dL (ref 0.3–1.2)

## 2016-12-13 MED ORDER — AMITRIPTYLINE HCL 25 MG PO TABS
25.0000 mg | ORAL_TABLET | Freq: Every day | ORAL | Status: DC
  Administered 2016-12-13: 25 mg via ORAL
  Filled 2016-12-13: qty 1

## 2016-12-13 MED ORDER — SODIUM CHLORIDE 0.9 % IV SOLN: INTRAVENOUS | Status: DC

## 2016-12-13 MED ORDER — MIDAZOLAM HCL 2 MG/2ML IJ SOLN
INTRAMUSCULAR | Status: AC | PRN
  Administered 2016-12-13 (×2): 1 mg via INTRAVENOUS

## 2016-12-13 MED ORDER — HYDROMORPHONE HCL 1 MG/ML IJ SOLN
INTRAMUSCULAR | Status: AC
  Filled 2016-12-13: qty 1

## 2016-12-13 MED ORDER — ESCITALOPRAM OXALATE 20 MG PO TABS
20.0000 mg | ORAL_TABLET | Freq: Every day | ORAL | Status: DC
  Administered 2016-12-13: 20 mg via ORAL
  Filled 2016-12-13: qty 1

## 2016-12-13 MED ORDER — GI COCKTAIL ~~LOC~~: 30.0000 mL | Freq: Three times a day (TID) | ORAL | Status: DC | PRN

## 2016-12-13 MED ORDER — ACETAMINOPHEN 650 MG RE SUPP: 650.0000 mg | Freq: Four times a day (QID) | RECTAL | Status: DC | PRN

## 2016-12-13 MED ORDER — DEXAMETHASONE SODIUM PHOSPHATE 10 MG/ML IJ SOLN
4.0000 mg | Freq: Once | INTRAMUSCULAR | Status: AC
  Administered 2016-12-13: 4 mg via INTRAVENOUS
  Filled 2016-12-13: qty 1

## 2016-12-13 MED ORDER — MORPHINE SULFATE (PF) 4 MG/ML IV SOLN
1.0000 mg | INTRAVENOUS | Status: DC | PRN
  Administered 2016-12-13: 1 mg via INTRAVENOUS
  Filled 2016-12-13: qty 1

## 2016-12-13 MED ORDER — NITROFURANTOIN MACROCRYSTAL 50 MG PO CAPS
50.0000 mg | ORAL_CAPSULE | Freq: Every day | ORAL | Status: DC
  Administered 2016-12-13: 50 mg via ORAL
  Filled 2016-12-13: qty 1

## 2016-12-13 MED ORDER — HYDROMORPHONE HCL 1 MG/ML IJ SOLN
1.0000 mg | Freq: Once | INTRAMUSCULAR | Status: AC
  Administered 2016-12-13: 1 mg via INTRAVENOUS
  Filled 2016-12-13: qty 1

## 2016-12-13 MED ORDER — ONDANSETRON HCL 4 MG PO TABS: 4.0000 mg | ORAL_TABLET | Freq: Four times a day (QID) | ORAL | Status: DC | PRN

## 2016-12-13 MED ORDER — ACETAMINOPHEN 325 MG PO TABS
650.0000 mg | ORAL_TABLET | Freq: Four times a day (QID) | ORAL | Status: DC | PRN
  Administered 2016-12-13: 650 mg via ORAL
  Filled 2016-12-13: qty 2

## 2016-12-13 MED ORDER — YTTRIUM 90 INJECTION: 18.9700 | INJECTION | Freq: Once | INTRAVENOUS | Status: DC

## 2016-12-13 MED ORDER — HYDROMORPHONE HCL 1 MG/ML IJ SOLN
INTRAMUSCULAR | Status: AC | PRN
  Administered 2016-12-13 (×2): 1 mg via INTRAVENOUS

## 2016-12-13 MED ORDER — MIDAZOLAM HCL 2 MG/2ML IJ SOLN
INTRAMUSCULAR | Status: AC
  Filled 2016-12-13: qty 6

## 2016-12-13 MED ORDER — OXYCODONE-ACETAMINOPHEN 5-325 MG PO TABS
2.0000 | ORAL_TABLET | ORAL | Status: DC | PRN
  Administered 2016-12-13 – 2016-12-14 (×3): 2 via ORAL
  Filled 2016-12-13 (×3): qty 2

## 2016-12-13 MED ORDER — GABAPENTIN 300 MG PO CAPS
300.0000 mg | ORAL_CAPSULE | Freq: Four times a day (QID) | ORAL | Status: DC
  Filled 2016-12-13: qty 1

## 2016-12-13 MED ORDER — GABAPENTIN 300 MG PO CAPS
600.0000 mg | ORAL_CAPSULE | Freq: Every day | ORAL | Status: DC
  Administered 2016-12-14: 600 mg via ORAL
  Filled 2016-12-13: qty 2

## 2016-12-13 MED ORDER — NITROGLYCERIN 0.4 MG SL SUBL: 0.4000 mg | SUBLINGUAL_TABLET | SUBLINGUAL | Status: DC | PRN

## 2016-12-13 MED ORDER — LIDOCAINE HCL 1 % IJ SOLN
INTRAMUSCULAR | Status: AC
  Filled 2016-12-13: qty 20

## 2016-12-13 MED ORDER — PIPERACILLIN-TAZOBACTAM 3.375 G IVPB
3.3750 g | Freq: Once | INTRAVENOUS | Status: AC
  Administered 2016-12-13: 3.375 g via INTRAVENOUS
  Filled 2016-12-13: qty 50

## 2016-12-13 MED ORDER — GABAPENTIN 300 MG PO CAPS
300.0000 mg | ORAL_CAPSULE | Freq: Every day | ORAL | Status: DC
  Administered 2016-12-14: 300 mg via ORAL
  Filled 2016-12-13: qty 1

## 2016-12-13 MED ORDER — LOSARTAN POTASSIUM 50 MG PO TABS
50.0000 mg | ORAL_TABLET | Freq: Every day | ORAL | Status: DC
  Administered 2016-12-13 – 2016-12-14 (×2): 50 mg via ORAL
  Filled 2016-12-13 (×2): qty 1

## 2016-12-13 MED ORDER — POLYETHYLENE GLYCOL 3350 17 G PO PACK: 17.0000 g | PACK | Freq: Every day | ORAL | Status: DC | PRN

## 2016-12-13 MED ORDER — ONDANSETRON HCL 4 MG/2ML IJ SOLN
4.0000 mg | Freq: Once | INTRAMUSCULAR | Status: AC
  Administered 2016-12-13: 4 mg via INTRAVENOUS
  Filled 2016-12-13: qty 2

## 2016-12-13 MED ORDER — IOPAMIDOL (ISOVUE-300) INJECTION 61%
INTRAVENOUS | Status: AC
  Filled 2016-12-13: qty 400

## 2016-12-13 MED ORDER — FENTANYL CITRATE (PF) 100 MCG/2ML IJ SOLN
INTRAMUSCULAR | Status: AC
  Filled 2016-12-13: qty 2

## 2016-12-13 MED ORDER — LIDOCAINE HCL 1 % IJ SOLN
INTRAMUSCULAR | Status: AC | PRN
  Administered 2016-12-13: 5 mL

## 2016-12-13 MED ORDER — IOPAMIDOL (ISOVUE-300) INJECTION 61%: 100.0000 mL | Freq: Once | INTRAVENOUS | Status: DC | PRN

## 2016-12-13 MED ORDER — HYDROCODONE-ACETAMINOPHEN 5-325 MG PO TABS: 1.0000 | ORAL_TABLET | ORAL | Status: DC | PRN

## 2016-12-13 MED ORDER — PANTOPRAZOLE SODIUM 40 MG PO TBEC
40.0000 mg | DELAYED_RELEASE_TABLET | Freq: Every day | ORAL | Status: DC
  Administered 2016-12-13 – 2016-12-14 (×2): 40 mg via ORAL
  Filled 2016-12-13 (×2): qty 1

## 2016-12-13 MED ORDER — SODIUM CHLORIDE 0.9 % IV SOLN
INTRAVENOUS | Status: AC
  Administered 2016-12-13: 17:00:00 via INTRAVENOUS

## 2016-12-13 MED ORDER — PANTOPRAZOLE SODIUM 40 MG IV SOLR
40.0000 mg | Freq: Once | INTRAVENOUS | Status: AC
  Administered 2016-12-13: 40 mg via INTRAVENOUS
  Filled 2016-12-13: qty 40

## 2016-12-13 MED ORDER — OCTREOTIDE ACETATE 100 MCG/ML IJ SOLN
200.0000 ug | Freq: Once | INTRAMUSCULAR | Status: AC
  Administered 2016-12-13: 200 ug via INTRAVENOUS
  Filled 2016-12-13: qty 2

## 2016-12-13 MED ORDER — HYDRALAZINE HCL 20 MG/ML IJ SOLN
10.0000 mg | Freq: Four times a day (QID) | INTRAMUSCULAR | Status: DC | PRN
  Administered 2016-12-13: 10 mg via INTRAVENOUS
  Filled 2016-12-13: qty 1

## 2016-12-13 MED ORDER — GABAPENTIN 300 MG PO CAPS
600.0000 mg | ORAL_CAPSULE | Freq: Every day | ORAL | Status: DC
  Administered 2016-12-13: 600 mg via ORAL
  Filled 2016-12-13: qty 2

## 2016-12-13 MED ORDER — FLUTICASONE PROPIONATE 50 MCG/ACT NA SUSP
2.0000 | Freq: Every day | NASAL | Status: DC
  Filled 2016-12-13 (×2): qty 16

## 2016-12-13 MED ORDER — ONDANSETRON HCL 4 MG/2ML IJ SOLN
4.0000 mg | Freq: Four times a day (QID) | INTRAMUSCULAR | Status: DC | PRN
  Administered 2016-12-13: 4 mg via INTRAVENOUS
  Filled 2016-12-13: qty 2

## 2016-12-13 MED ORDER — LEVOTHYROXINE SODIUM 75 MCG PO TABS
75.0000 ug | ORAL_TABLET | Freq: Every day | ORAL | Status: DC
  Administered 2016-12-13 – 2016-12-14 (×2): 75 ug via ORAL
  Filled 2016-12-13 (×2): qty 1

## 2016-12-13 MED ORDER — SODIUM CHLORIDE 0.9 % IV SOLN
INTRAVENOUS | Status: DC
  Administered 2016-12-13: 09:00:00 via INTRAVENOUS

## 2016-12-13 MED ORDER — ALBUTEROL SULFATE (2.5 MG/3ML) 0.083% IN NEBU: 2.5000 mg | INHALATION_SOLUTION | RESPIRATORY_TRACT | Status: DC | PRN

## 2016-12-13 MED ORDER — OXYCODONE HCL 5 MG PO TABS
5.0000 mg | ORAL_TABLET | ORAL | Status: DC | PRN
  Filled 2016-12-13: qty 1

## 2016-12-13 MED ORDER — FUROSEMIDE 40 MG PO TABS: 40.0000 mg | ORAL_TABLET | Freq: Every day | ORAL | Status: DC | PRN

## 2016-12-13 MED ORDER — FENTANYL CITRATE (PF) 100 MCG/2ML IJ SOLN
INTRAMUSCULAR | Status: AC | PRN
  Administered 2016-12-13 (×4): 25 ug via INTRAVENOUS

## 2016-12-13 NOTE — H&P (Signed)
HISTORY AND PHYSICAL       PATIENT DETAILS Name: [name] Age: 83 y.o. Sex: female Date of Birth: Sep 24, 1933 Admit Date: 12/13/2016 [id number], [place] Hun, MD   Patient coming from: Interventional radiology   CHIEF COMPLAINT:  Chest pain/back pain while at interventional radiology-now resolved.  HPI: [name] is a 83 y.o. female with medical history significant of neuroendocrine tumor of the lung with multiple liver metastases, prior history of CVA, history of cerebellar degeneration-mostly wheelchair-bound, hypothyroidism, paroxysmal atrial fibrillation who was seen by interventional radiology today and underwent right hepatic lobe Y 90 radioembolization, during the procedure, patient had transient back pain and chest pain. EKG and troponins were negative, chest x-ray did not show any acute abnormalities. Patient received IV Dilaudid with complete resolution of her pain. The hospitalist service was consulted for further evaluation.  During my evaluation, patient was lying comfortably in bed-her spouse at bedside. She had very minimal to no chest pain during my evaluation. She claims that the pain that she had when she was in IR, was sharp, in the center of the chest and in her back. It has not reoccurred since then. There was no associated shortness of breath, nausea, vomiting.  Blood pressure in her right arm was 181/82, blood pressure in her left arm was 187/84 (this was done by me at bedside).  There is no history of nausea, vomiting, diarrhea, abdominal pain, shortness of breath, fever, headache.  Note: Lives at: Home Mobility: Wheelchair  Chronic Indwelling Foley:no   REVIEW OF SYSTEMS:  Constitutional:   No  weight loss, night sweats,  Fevers, chills, fatigue.  HEENT:    No headaches, Dysphagia,Tooth/dental problems,Sore throat,   Cardio-vascular: No Orthopnea, PND,lower extremity edema, anasarca, palpitations  GI:  No heartburn, indigestion,  abdominal pain, nausea, vomiting, diarrhea, melena or hematochezia  Resp: No shortness of breath, cough, hemoptysis,plueritic chest pain.   Skin:  No rash or lesions.  GU:  No dysuria, change in color of urine, no urgency or frequency.  No flank pain.  Musculoskeletal: No joint pain or swelling.  No decreased range of motion.  No back pain.  Endocrine: No heat intolerance, no cold intolerance, no polyuria, no polydipsia  Psych: No change in mood or affect. No depression or anxiety.  No memory loss.   ALLERGIES:   Allergies  Allergen Reactions  . Lisinopril Cough    PAST MEDICAL HISTORY: Past Medical History:  Diagnosis Date  . Acute respiratory failure with hypoxia ([place]) 10/23/2015  . Allergic rhinitis    PT. DENIES  . Anxiety   . Arthritis    NECK  . Ataxia   . Bradycardia    primarily nocturnal  . Burning tongue syndrome 25 years  . Cancer ([place])   . Cataract   . Cerebellar degeneration   . Chronic pericarditis   . Chronic urinary tract infection   . Complication of anesthesia    low o2 sats, coded 30 years ago  . CVA (cerebral infarction) 05/2003  . Depression   . Encounter for antineoplastic chemotherapy 10/09/2016  . Gait disorder   . Gastric polyps   . GERD (gastroesophageal reflux disease)   . Goals of care, counseling/discussion 10/09/2016  . High cholesterol   . Hyperlipidemia   . Hypertension   . Hypertension   . Hypotension   . Hypothyroidism   . IBS (irritable bowel syndrome)   . Obesity   . Paroxysmal atrial fibrillation ([name])  chads2vasc score is 6,  she is felt to be a poor candidate for anticoagulation  . Personal history of arterial venous malformation (AVM)    right side of face  . Seizure disorder ([place])   . Seizures ([place]) 2003   " smelling"- Gabapentin "no problem"  . Shortness of breath dyspnea    with exertion  . Sternum fx 10/27/2013  . Stroke [facility]) 5 years ago   Right side of face weak, slurred speach-   . Thyroid disease    . TIA (transient ischemic attack)   . UTI (lower urinary tract infection) 03/27/2016   "frequently"    PAST SURGICAL HISTORY: Past Surgical History:  Procedure Laterality Date  . APPENDECTOMY  83 years old  . COLONOSCOPY  2006, 2009  . COLONOSCOPY WITH PROPOFOL N/A 03/28/2016   Procedure: COLONOSCOPY WITH PROPOFOL;  Surgeon: Gatha Mayer, MD;  Location: [facility];  Service: Endoscopy;  Laterality: N/A;  . cyst removed  35 years ago  . EP IMPLANTABLE DEVICE N/A 01/06/2015   Procedure: Loop Recorder Insertion;  Surgeon: Thompson Grayer, MD;  Location: [facility];  Service: Cardiovascular;  Laterality: N/A;  . EYE SURGERY Right    Cataract  . IR ANGIOGRAM SELECTIVE EACH ADDITIONAL VESSEL  11/28/2016  . IR ANGIOGRAM SELECTIVE EACH ADDITIONAL VESSEL  11/28/2016  . IR ANGIOGRAM SELECTIVE EACH ADDITIONAL VESSEL  11/28/2016  . IR ANGIOGRAM SELECTIVE EACH ADDITIONAL VESSEL  11/28/2016  . IR ANGIOGRAM VISCERAL SELECTIVE  11/28/2016  . IR ANGIOGRAM VISCERAL SELECTIVE  11/28/2016  . IR EMBO ARTERIAL NOT HEMORR HEMANG INC GUIDE ROADMAPPING  11/28/2016  . IR US GUIDE VASC ACCESS RIGHT  11/28/2016  . KNEE ARTHROSCOPY Right 11/14/2006  . KNEE ARTHROSCOPY Bilateral 5 and 6 years ago  . KNEE ARTHROSCOPY WITH LATERAL MENISECTOMY  07/03/2012   Procedure: KNEE ARTHROSCOPY WITH LATERAL MENISECTOMY;  Surgeon: Magnus Sinning, MD;  Location: [facility];  Service: Orthopedics;  Laterality: Left;  with Partial Lateral Menisectomy and Medial Menisectomy. Shaving of medial and lateral femoral condyles. Shaving of patella. Removal of a loose body  . tibial and fibular internal fixation Left   . TOTAL ABDOMINAL HYSTERECTOMY  83 years old  . UPPER GASTROINTESTINAL ENDOSCOPY  2009, 2013    MEDICATIONS AT HOME: Prior to Admission medications   Medication Sig Start Date End Date Taking? Authorizing Provider  amitriptyline (ELAVIL) 25 MG tablet Take 1 tablet (25 mg total) by mouth at bedtime. 10/01/16  Yes Eustaquio Maize, MD   capecitabine (XELODA) 500 MG tablet Take 3 tablets (1,500 mg total) by mouth 2 (two) times daily after a meal. 12/05/16  Yes Curt Bears, MD  escitalopram (LEXAPRO) 20 MG tablet Take 1 tablet (20 mg total) by mouth at bedtime. 10/01/16  Yes Eustaquio Maize, MD  fluticasone (FLONASE) 50 MCG/ACT nasal spray Place 2 sprays into both nostrils at bedtime. 09/20/16  Yes Eustaquio Maize, MD  gabapentin (NEURONTIN) 300 MG capsule Take 1-2 capsules (300-600 mg total) by mouth 4 (four) times daily. 08/23/16  Yes Eustaquio Maize, MD  levothyroxine (SYNTHROID, LEVOTHROID) 75 MCG tablet TAKE 1 TABLET DAILY BEFORE BREAKFAST 09/25/16  Yes Evelina Dun A, FNP  losartan (COZAAR) 50 MG tablet TAKE 1 TABLET DAILY 07/17/16  Yes Evelina Dun A, FNP  lovastatin (MEVACOR) 40 MG tablet TAKE 1 TABLET AT BEDTIME 10/25/16  Yes Eustaquio Maize, MD  Melatonin 3 MG TBDP Take 3-6 mg by mouth at bedtime as needed. Patient taking differently: Take  3-6 mg by mouth at bedtime as needed (for sleep).  08/02/16  Yes Eustaquio Maize, MD  nitrofurantoin (MACRODANTIN) 50 MG capsule Take 50 mg by mouth at bedtime.   Yes [provider]  omeprazole (PRILOSEC) 40 MG capsule TAKE 1 CAPSULE DAILY 10/25/16  Yes Eustaquio Maize, MD  ondansetron (ZOFRAN) 8 MG tablet Take 1 tablet (8 mg total) by mouth every 8 (eight) hours as needed for nausea or vomiting. 10/09/16  Yes Curt Bears, MD  oxyCODONE-acetaminophen (PERCOCET) 5-325 MG tablet Take 1-2 tablets by mouth every 4 (four) hours as needed. 12/05/16  Yes Curt Bears, MD  temozolomide (TEMODAR) 140 MG capsule Take 2 capsules (280 mg total) by mouth daily. May take on an empty stomach or at bedtime to decrease nausea & vomiting. 12/05/16  Yes Curt Bears, MD  Vitamin D, Ergocalciferol, (DRISDOL) 50000 units CAPS capsule Take 1 capsule (50,000 Units total) by mouth every 7 (seven) days. Patient taking differently: Take 50,000 Units by mouth every Friday.  08/23/16  Yes  Eustaquio Maize, MD  furosemide (LASIX) 40 MG tablet Take 40 mg by mouth daily as needed for edema.     [provider]  hydroxypropyl methylcellulose / hypromellose (ISOPTO TEARS / GONIOVISC) 2.5 % ophthalmic solution Place 1 drop into both eyes at bedtime.    [provider]    FAMILY HISTORY: Family History  Problem Relation Age of Onset  . Heart attack Father 46       fatal  . Coronary artery disease Brother   . Diabetes Brother   . Prostate cancer Brother   . Prostate cancer Son     SOCIAL HISTORY:  reports that she quit smoking about 41 years ago. Her smoking use included Cigarettes. She has a 2.50 pack-year smoking history. She has never used smokeless tobacco. She reports that she does not drink alcohol or use drugs.  PHYSICAL EXAM: Blood pressure (!) 170/93, pulse 86, temperature 97.7 F (36.5 C), temperature source Oral, resp. rate 20, height '5\' 4"'$  (1.626 m), weight 101.7 kg (224 lb 3.3 oz), SpO2 94 %.  General appearance :Awake, alert, not in any distress.   Eyes:, pupils equally reactive to light and accomodation,no scleral icterus. HEENT: Atraumatic and Normocephalic Neck: supple, no JVD. No cervical lymphadenopathy.  Resp:Good air entry bilaterally, no added sounds  CVS: S1 S2 regular, no murmurs.  GI: Bowel sounds present, Non tender and not distended with no gaurding, rigidity or rebound.No organomegaly Extremities: B/L Lower Ext shows no edema, both legs are warm to touch Neurology:  speech clear,Non focal, sensation is grossly intact. Psychiatric: Normal judgment and insight. Alert and oriented x 3. Musculoskeletal:No digital cyanosis Skin:No Rash, warm and dry Wounds:N/A  LABS ON ADMISSION:  I have personally reviewed following labs and imaging studies  CBC:  Recent Labs Lab 12/13/16 0801  WBC 4.1  NEUTROABS 1.7  HGB 13.2  HCT 40.8  MCV 94.4  PLT 500    Basic Metabolic Panel:  Recent Labs Lab 12/13/16 0801  NA 136  K 4.3   CL 101  CO2 27  GLUCOSE 100*  BUN 15  CREATININE 0.95  CALCIUM 9.9    GFR: Estimated Creatinine Clearance: 54.8 mL/min (by C-G formula based on SCr of 0.95 mg/dL).  Liver Function Tests:  Recent Labs Lab 12/13/16 0801  AST 26  ALT 15  ALKPHOS 72  BILITOT 0.8  PROT 7.6  ALBUMIN 4.1   No results for input(s): LIPASE, AMYLASE in the last  168 hours. No results for input(s): AMMONIA in the last 168 hours.  Coagulation Profile: No results for input(s): INR, PROTIME in the last 168 hours.  Cardiac Enzymes:  Recent Labs Lab 12/13/16 1247  TROPONINI <0.03    BNP (last 3 results) No results for input(s): PROBNP in the last 8760 hours.  HbA1C: No results for input(s): HGBA1C in the last 72 hours.  CBG: No results for input(s): GLUCAP in the last 168 hours.  Lipid Profile: No results for input(s): CHOL, HDL, LDLCALC, TRIG, CHOLHDL, LDLDIRECT in the last 72 hours.  Thyroid Function Tests: No results for input(s): TSH, T4TOTAL, FREET4, T3FREE, THYROIDAB in the last 72 hours.  Anemia Panel: No results for input(s): VITAMINB12, FOLATE, FERRITIN, TIBC, IRON, RETICCTPCT in the last 72 hours.  Urine analysis:    Component Value Date/Time   COLORURINE YELLOW 10/29/2016 1218   APPEARANCEUR CLEAR 10/29/2016 1218   APPEARANCEUR Clear 02/17/2016 1707   LABSPEC 1.011 10/29/2016 1218   PHURINE 6.0 10/29/2016 [place] 10/29/2016 [place] NEGATIVE 10/29/2016 1218   BILIRUBINUR NEGATIVE 10/29/2016 1218   BILIRUBINUR Negative 02/17/2016 1707   KETONESUR NEGATIVE 10/29/2016 1218   PROTEINUR NEGATIVE 10/29/2016 1218   UROBILINOGEN negative 05/24/2015 1434   NITRITE NEGATIVE 10/29/2016 1218   LEUKOCYTESUR NEGATIVE 10/29/2016 1218   LEUKOCYTESUR Negative 02/17/2016 1707    Sepsis Labs: Lactic Acid, Venous No results found for: [place]   Microbiology: No results found for this or any previous visit (from the past 240 hour(s)).    RADIOLOGIC  STUDIES ON ADMISSION: Nm Liver Img Spect  Result Date: 12/13/2016 CLINICAL DATA:  Metastatic malignant neuroendocrine tumor to the liver. EXAM: NUCLEAR MEDICINE SPECIAL MED RAD PHYSICS CONS; NUCLEAR MEDICINE RADIO PHARM THERAPY INTRA ARTERIAL; NUCLEAR MEDICINE TREATMENT PROCEDURE; NUCLEAR MEDICINE LIVER SCAN TECHNIQUE: In conjunction with the interventional radiologist a Y- Microsphere dose was calculated utilizing body surface area formulation. Calculated dose equal 36.99 mCi. Pre therapy MAA liver SPECT scan and CTA were evaluated. Utilizing a microcatheter system, the hepatic artery was selected and Y-90 microspheres were delivered in fractionated aliquots. Radiopharmaceutical was delivered by the interventional radiologist and nuclear radiologist. [name] through the infusion patient reported back pain and the patient's blood pressure was noted to be increased and the procedure was then terminated. The patient tolerated procedure well. No adverse effects were noted. Bremsstrahlung planar and SPECT imaging of the abdomen following intrahepatic arterial delivery of Y-90 microsphere was performed. RADIOPHARMACEUTICALS:  A total of 56.43 millicuries of Y- 90 microsphere administered. COMPARISON:  10/02/2016 FINDINGS: Y - 90 microspheres therapy as above. First therapy the right hepatic lobe. Bremsstrahlung planar and SPECT imaging of the abdomen following intrahepatic arterial delivery of Y-35mcrosphere demonstrates radioactivity localized to the right hepatic lobe. No evidence of extrahepatic activity. IMPRESSION: Successful Y - 90 microsphere delivery for treatment of unresectable liver metastasis. First therapy to the right lobe. Bremssstrahlung scan demonstrates activity localized to right hepatic lobe with no extrahepatic activity identified. Electronically Signed   By: TKerby MoorsM.D.   On: 12/13/2016 13:39   Nm Special Med Rad Physics Cons  Result Date: 12/13/2016 CLINICAL DATA:  Metastatic  malignant neuroendocrine tumor to the liver. EXAM: NUCLEAR MEDICINE SPECIAL MED RAD PHYSICS CONS; NUCLEAR MEDICINE RADIO PHARM THERAPY INTRA ARTERIAL; NUCLEAR MEDICINE TREATMENT PROCEDURE; NUCLEAR MEDICINE LIVER SCAN TECHNIQUE: In conjunction with the interventional radiologist a Y- Microsphere dose was calculated utilizing body surface area formulation. Calculated dose equal 36.99 mCi. Pre therapy MAA liver SPECT scan  and CTA were evaluated. Utilizing a microcatheter system, the hepatic artery was selected and Y-90 microspheres were delivered in fractionated aliquots. Radiopharmaceutical was delivered by the interventional radiologist and nuclear radiologist. [name] through the infusion patient reported back pain and the patient's blood pressure was noted to be increased and the procedure was then terminated. The patient tolerated procedure well. No adverse effects were noted. Bremsstrahlung planar and SPECT imaging of the abdomen following intrahepatic arterial delivery of Y-90 microsphere was performed. RADIOPHARMACEUTICALS:  A total of 58.09 millicuries of Y- 90 microsphere administered. COMPARISON:  10/02/2016 FINDINGS: Y - 90 microspheres therapy as above. First therapy the right hepatic lobe. Bremsstrahlung planar and SPECT imaging of the abdomen following intrahepatic arterial delivery of Y-5mcrosphere demonstrates radioactivity localized to the right hepatic lobe. No evidence of extrahepatic activity. IMPRESSION: Successful Y - 90 microsphere delivery for treatment of unresectable liver metastasis. First therapy to the right lobe. Bremssstrahlung scan demonstrates activity localized to right hepatic lobe with no extrahepatic activity identified. Electronically Signed   By: TKerby MoorsM.D.   On: 12/13/2016 13:39   Nm Special Treatment Procedure  Result Date: 12/13/2016 CLINICAL DATA:  Metastatic malignant neuroendocrine tumor to the liver. EXAM: NUCLEAR MEDICINE SPECIAL MED RAD PHYSICS CONS;  NUCLEAR MEDICINE RADIO PHARM THERAPY INTRA ARTERIAL; NUCLEAR MEDICINE TREATMENT PROCEDURE; NUCLEAR MEDICINE LIVER SCAN TECHNIQUE: In conjunction with the interventional radiologist a Y- Microsphere dose was calculated utilizing body surface area formulation. Calculated dose equal 36.99 mCi. Pre therapy MAA liver SPECT scan and CTA were evaluated. Utilizing a microcatheter system, the hepatic artery was selected and Y-90 microspheres were delivered in fractionated aliquots. Radiopharmaceutical was delivered by the interventional radiologist and nuclear radiologist. MEpimenio Footthrough the infusion patient reported back pain and the patient's blood pressure was noted to be increased and the procedure was then terminated. The patient tolerated procedure well. No adverse effects were noted. Bremsstrahlung planar and SPECT imaging of the abdomen following intrahepatic arterial delivery of Y-90 microsphere was performed. RADIOPHARMACEUTICALS:  A total of 198.33millicuries of Y- 90 microsphere administered. COMPARISON:  10/02/2016 FINDINGS: Y - 90 microspheres therapy as above. First therapy the right hepatic lobe. Bremsstrahlung planar and SPECT imaging of the abdomen following intrahepatic arterial delivery of Y-933mrosphere demonstrates radioactivity localized to the right hepatic lobe. No evidence of extrahepatic activity. IMPRESSION: Successful Y - 90 microsphere delivery for treatment of unresectable liver metastasis. First therapy to the right lobe. Bremssstrahlung scan demonstrates activity localized to right hepatic lobe with no extrahepatic activity identified. Electronically Signed   By: TaKerby Moors.D.   On: 12/13/2016 13:39   Dg Chest Port 1 View  Result Date: 12/13/2016 CLINICAL DATA:  Metastatic neuroendocrine cancer EXAM: PORTABLE CHEST 1 VIEW COMPARISON:  None. FINDINGS: Loop recorder device projects over the left chest. Heart is borderline in size. Mild vascular congestion. No confluent opacities or  effusions. No acute bony abnormality. IMPRESSION: Borderline heart size.  Mild vascular congestion. Electronically Signed   By: KeRolm Baptise.D.   On: 12/13/2016 14:29   Nm Radio Pharm Therapy Intraarterial  Result Date: 12/13/2016 CLINICAL DATA:  Metastatic malignant neuroendocrine tumor to the liver. EXAM: NUCLEAR MEDICINE SPECIAL MED RAD PHYSICS CONS; NUCLEAR MEDICINE RADIO PHARM THERAPY INTRA ARTERIAL; NUCLEAR MEDICINE TREATMENT PROCEDURE; NUCLEAR MEDICINE LIVER SCAN TECHNIQUE: In conjunction with the interventional radiologist a Y- Microsphere dose was calculated utilizing body surface area formulation. Calculated dose equal 36.99 mCi. Pre therapy MAA liver SPECT scan and CTA were evaluated. Utilizing a microcatheter system, the hepatic  artery was selected and Y-90 microspheres were delivered in fractionated aliquots. Radiopharmaceutical was delivered by the interventional radiologist and nuclear radiologist. [name] through the infusion patient reported back pain and the patient's blood pressure was noted to be increased and the procedure was then terminated. The patient tolerated procedure well. No adverse effects were noted. Bremsstrahlung planar and SPECT imaging of the abdomen following intrahepatic arterial delivery of Y-90 microsphere was performed. RADIOPHARMACEUTICALS:  A total of 85.27 millicuries of Y- 90 microsphere administered. COMPARISON:  10/02/2016 FINDINGS: Y - 90 microspheres therapy as above. First therapy the right hepatic lobe. Bremsstrahlung planar and SPECT imaging of the abdomen following intrahepatic arterial delivery of Y-62mcrosphere demonstrates radioactivity localized to the right hepatic lobe. No evidence of extrahepatic activity. IMPRESSION: Successful Y - 90 microsphere delivery for treatment of unresectable liver metastasis. First therapy to the right lobe. Bremssstrahlung scan demonstrates activity localized to right hepatic lobe with no extrahepatic activity identified.  Electronically Signed   By: TKerby MoorsM.D.   On: 12/13/2016 13:39    I have personally reviewed images of chest xray   EKG:  Personally reviewed. NSR  ASSESSMENT AND PLAN: Chest pain: With mostly atypical features-was very brief and is completely resolved. Could have been related to the procedure itself.For now we will monitor in the telemetry unit, cycle troponins. If reoccurs, we can then consider further imaging/workup. If she remains stable and chest pain-free, suspect she can be discharged tomorrow morning without any further workup.  Uncontrolled hypertension: Restart antihypertensives-she was nothing by mouth and did not take any of her medications. Follow and adjust accordingly.  History of neuroendocrine carcinoma with liver metastases: Will defer further to oncology and interventional radiology. Have consulted pharmacy to see what she needs to be in terms of her oral chemotherapy agents that she takes chronically-patient is not sure of the dosing and the timing of her medications.  Paroxysmal atrial fibrillation: Currently in sinus rhythm-she does not wish to be on anticoagulation. Reviewed prior notes-poor candidate for long-term anticoagulation given ataxia/cerebellar degeneration and risk of falls. Furthermore, patient claims she has telangiectasias and has been told by numerous MDs not to be on blood thinners.  GERD: Continue PPI  Hypothyroidism: Continue levothyroxine  Further plan will depend as patient's clinical course evolves and further radiologic and laboratory data become available. Patient will be monitored closely.  Above noted plan was discussed with patient/family  face to face at bedside, they were in agreement.   CONSULTS: None  DVT Prophylaxis: SCD's  Code Status: Full Code  Disposition Plan:  Discharge back home possibly in 1 day, depending on clinical course  Admission status:  Observation going to tele  Total time spent  45 minutes.Greater  than 50% of this time was spent in counseling, explanation of diagnosis, planning of further management, and coordination of care.  SOren BinetTriad Hospitalists Pager [phone] If 7PM-7AM, please contact night-coverage [email or web address] Password [email or web address]5/17/2018, 4:25 PM

## 2016-12-13 NOTE — Sedation Documentation (Signed)
Pt c/o chest and back pain during end of procedure 6/10. fentanyl and dilaudid given pt states pain 4/10. EKG obtained per DR Pascal Lux

## 2016-12-13 NOTE — H&P (Signed)
Referring Physician(s): Mohamed,M  Supervising Physician: Sandi Mariscal  Patient Status:  [facility]  Chief Complaint:  Metastatic neuroendocrine carcinoma to liver  Subjective: Patient familiar to IR service from prior cerebral arteriogram in 2004 as well as hepatic/visceral arteriogram with pre-Y 90 mapping study on 11/28/16. She has a history of metastatic intermediate grade neuroendocrine tumor of probable lung primary diagnosed in January 2018, presenting with small bilateral pulmonary nodules in addition to multiple liver metastases(also with panc lesion). She presents again today for Y 90 right hepatic lobe radioembolization. She currently denies fever, chest pain, worsening dyspnea, cough, back pain, nausea, vomiting or abnormal bleeding. She does have occasional headache, urinary incontinence and recent left-sided abdominal discomfort. Past Medical History:  Diagnosis Date  . Acute respiratory failure with hypoxia ([facility]) 10/23/2015  . Allergic rhinitis    PT. DENIES  . Anxiety   . Arthritis    NECK  . Ataxia   . Bradycardia    primarily nocturnal  . Burning tongue syndrome 25 years  . Cancer ([place])   . Cataract   . Cerebellar degeneration   . Chronic pericarditis   . Chronic urinary tract infection   . Complication of anesthesia    low o2 sats, coded 30 years ago  . CVA (cerebral infarction) 05/2003  . Depression   . Encounter for antineoplastic chemotherapy 10/09/2016  . Gait disorder   . Gastric polyps   . GERD (gastroesophageal reflux disease)   . Goals of care, counseling/discussion 10/09/2016  . High cholesterol   . Hyperlipidemia   . Hypertension   . Hypertension   . Hypotension   . Hypothyroidism   . IBS (irritable bowel syndrome)   . Obesity   . Paroxysmal atrial fibrillation ([name])    chads2vasc score is 6,  she is felt to be a poor candidate for anticoagulation  . Personal history of arterial venous malformation (AVM)    right side of face  . Seizure  disorder ([place])   . Seizures ([facility]) 2003   " smelling"- Gabapentin "no problem"  . Shortness of breath dyspnea    with exertion  . Sternum fx 10/27/2013  . Stroke [facility]) 5 years ago   Right side of face weak, slurred speach-   . Thyroid disease   . TIA (transient ischemic attack)   . UTI (lower urinary tract infection) 03/27/2016   "frequently"   Past Surgical History:  Procedure Laterality Date  . APPENDECTOMY  83 years old  . COLONOSCOPY  2006, 2009  . COLONOSCOPY WITH PROPOFOL N/A 03/28/2016   Procedure: COLONOSCOPY WITH PROPOFOL;  Surgeon: Gatha Mayer, MD;  Location: [place];  Service: Endoscopy;  Laterality: N/A;  . cyst removed  35 years ago  . EP IMPLANTABLE DEVICE N/A 01/06/2015   Procedure: Loop Recorder Insertion;  Surgeon: Thompson Grayer, MD;  Location: [facility];  Service: Cardiovascular;  Laterality: N/A;  . EYE SURGERY Right    Cataract  . IR ANGIOGRAM SELECTIVE EACH ADDITIONAL VESSEL  11/28/2016  . IR ANGIOGRAM SELECTIVE EACH ADDITIONAL VESSEL  11/28/2016  . IR ANGIOGRAM SELECTIVE EACH ADDITIONAL VESSEL  11/28/2016  . IR ANGIOGRAM SELECTIVE EACH ADDITIONAL VESSEL  11/28/2016  . IR ANGIOGRAM VISCERAL SELECTIVE  11/28/2016  . IR ANGIOGRAM VISCERAL SELECTIVE  11/28/2016  . IR EMBO ARTERIAL NOT HEMORR HEMANG INC GUIDE ROADMAPPING  11/28/2016  . IR US GUIDE VASC ACCESS RIGHT  11/28/2016  . KNEE ARTHROSCOPY Right 11/14/2006  . KNEE ARTHROSCOPY Bilateral 5 and 6  years ago  . KNEE ARTHROSCOPY WITH LATERAL MENISECTOMY  07/03/2012   Procedure: KNEE ARTHROSCOPY WITH LATERAL MENISECTOMY;  Surgeon: Magnus Sinning, MD;  Location: [facility];  Service: Orthopedics;  Laterality: Left;  with Partial Lateral Menisectomy and Medial Menisectomy. Shaving of medial and lateral femoral condyles. Shaving of patella. Removal of a loose body  . tibial and fibular internal fixation Left   . TOTAL ABDOMINAL HYSTERECTOMY  83 years old  . UPPER GASTROINTESTINAL ENDOSCOPY  2009, 2013      Allergies: Lisinopril  Medications: Prior to Admission medications   Medication Sig Start Date End Date Taking? Authorizing Provider  amitriptyline (ELAVIL) 25 MG tablet Take 1 tablet (25 mg total) by mouth at bedtime. 10/01/16   Eustaquio Maize, MD  capecitabine (XELODA) 500 MG tablet Take 3 tablets (1,500 mg total) by mouth 2 (two) times daily after a meal. 12/05/16   Curt Bears, MD  escitalopram (LEXAPRO) 20 MG tablet Take 1 tablet (20 mg total) by mouth at bedtime. 10/01/16   Eustaquio Maize, MD  fluticasone (FLONASE) 50 MCG/ACT nasal spray Place 2 sprays into both nostrils at bedtime. 09/20/16   Eustaquio Maize, MD  furosemide (LASIX) 40 MG tablet Take 40 mg by mouth daily as needed for edema.     [provider]  gabapentin (NEURONTIN) 300 MG capsule Take 1-2 capsules (300-600 mg total) by mouth 4 (four) times daily. 08/23/16   Eustaquio Maize, MD  HYDROcodone-acetaminophen (NORCO/VICODIN) 5-325 MG tablet Take 1 tablet by mouth every 6 (six) hours as needed for moderate pain. Patient not taking: Reported on 10/31/2016 10/26/16   Wyatt Portela, MD  hydroxypropyl methylcellulose / hypromellose (ISOPTO TEARS / GONIOVISC) 2.5 % ophthalmic solution Place 1 drop into both eyes at bedtime.    [provider]  levothyroxine (SYNTHROID, LEVOTHROID) 75 MCG tablet TAKE 1 TABLET DAILY BEFORE BREAKFAST 09/25/16   Evelina Dun A, FNP  losartan (COZAAR) 50 MG tablet TAKE 1 TABLET DAILY 07/17/16   Evelina Dun A, FNP  lovastatin (MEVACOR) 40 MG tablet TAKE 1 TABLET AT BEDTIME 10/25/16   Eustaquio Maize, MD  Melatonin 3 MG TBDP Take 3-6 mg by mouth at bedtime as needed. Patient taking differently: Take 3-6 mg by mouth at bedtime as needed (for sleep).  08/02/16   Eustaquio Maize, MD  nitrofurantoin (MACRODANTIN) 50 MG capsule Take 50 mg by mouth at bedtime.    [provider]  omeprazole (PRILOSEC) 40 MG capsule TAKE 1 CAPSULE DAILY 10/25/16   Eustaquio Maize, MD   ondansetron (ZOFRAN) 8 MG tablet Take 1 tablet (8 mg total) by mouth every 8 (eight) hours as needed for nausea or vomiting. 10/09/16   Curt Bears, MD  oxyCODONE-acetaminophen (PERCOCET) 5-325 MG tablet Take 1-2 tablets by mouth every 4 (four) hours as needed. 12/05/16   Curt Bears, MD  temozolomide (TEMODAR) 140 MG capsule Take 2 capsules (280 mg total) by mouth daily. May take on an empty stomach or at bedtime to decrease nausea & vomiting. 12/05/16   Curt Bears, MD  Vitamin D, Ergocalciferol, (DRISDOL) 50000 units CAPS capsule Take 1 capsule (50,000 Units total) by mouth every 7 (seven) days. Patient taking differently: Take 50,000 Units by mouth every Friday.  08/23/16   Eustaquio Maize, MD     Vital Signs: BP 135/71 (BP Location: Right Arm)   Pulse 73   Temp 97.6 F (36.4 C) (Oral)   Resp 16   SpO2 97%  Physical Exam awake, alert. Chest clear to auscultation bilaterally. Heart with regular rate and rhythm. Abdomen obese, soft, positive bowel sounds, currently nontender. Lower extremities with some mild edema bilaterally.  Imaging: No results found.  Labs:  CBC:  Recent Labs  11/20/16 1423 11/28/16 0743 12/05/16 1049 12/13/16 0801  WBC 5.2 6.0 3.6* 4.1  HGB 13.3 13.1 12.5 13.2  HCT 41.6 39.6 38.4 40.8  PLT 176 189 156 175    COAGS:  Recent Labs  08/20/16 1244 11/28/16 0743  INR 1.07 1.04  APTT 27  --     BMP:  Recent Labs  07/16/16 0752 08/02/16 1534 10/29/16 1222 10/31/16 1126 11/20/16 1423 11/28/16 0743 12/05/16 1049  NA 135 137 135 135* 137 137 139  K 3.6 4.6 4.6 5.2 Repeated and Verified* 4.4 4.5 4.5  CL 105 96 100*  --   --  102  --   CO2 '24 25 29 27 26 27 25  '$ GLUCOSE 148* 103* 119* 109 114 110* 122  BUN '13 11 13 '$ 9.2 10.7 12 12.9  CALCIUM 8.8* 10.1 9.7 9.8 10.4 9.8 9.8  CREATININE 0.84 0.84 0.98 0.9 0.9 0.90 0.9  GFRNONAA >60 66 53*  --   --  59*  --   GFRAA >60 76 >60  --   --  >60  --     LIVER FUNCTION TESTS:  Recent  Labs  10/31/16 1126 11/20/16 1423 11/28/16 0743 12/05/16 1049  BILITOT 0.39 0.56 0.4 0.50  AST '28 20 26 17  '$ ALT '24 15 17 14  '$ ALKPHOS 81 86 81 78  PROT 6.5 7.3 7.6 6.7  ALBUMIN 3.6 3.9 4.0 3.6    Assessment and Plan: Pt with history of metastatic intermediate grade neuroendocrine tumor of probable lung primary diagnosed in January 2018, presenting with small bilateral pulmonary nodules in addition to multiple liver metastases(also with panc lesion). She was deemed an appropriate candidate for Y 90 hepatic radioembolization and has undergone arterial mapping on 11/28/16. She presents again today for Y 90 right hepatic lobe radioembolization. Details/risks of procedure, including but not limited to, internal bleeding, infection, contrast nephropathy, nontarget embolization, injury to adjacent structures discussed with patient and husband with their understanding and consent.   Electronically Signed: D. Nash Mantis 12/13/2016, 8:52 AM   I spent a total of 25 minutes  at the the patient's bedside AND on the patient's hospital floor or unit, greater than 50% of which was counseling/coordinating care for right hepatic lobe Y 90 radioembolization

## 2016-12-13 NOTE — Discharge Instructions (Signed)
° °  Post Y-90 Radioembolization Discharge Instructions  You have been given a radioactive material during your procedure.  While it is safe for you to be discharged home from the hospital, you need to proceed directly home.    Do not use public transportation, including air travel, lasting more than 2 hours for 1 week.  Avoid crowded public places for 1 week.  Adult visitors should try to avoid close contact with you for 1 week.    Children and pregnant females should not visit or have close contact with you for 1 week.  Items that you touch are not radioactive.  Do not sleep in the same bed as your partner for 1 week, and a condom should be used for sexual activity during the first 24 hours.  Your blood may be radioactive and caution should be used if any bleeding occurs during the recovery period.  Body fluids may be radioactive for 24 hours.  Wash your hands after voiding.  Men should sit to urinate.  Dispose of any soiled materials (flush down toilet or place in trash at home) during the first day.  Drink 6 to 8 glasses of fluids per day for 5 days to hydrate yourself.  If you need to see a doctor during the first week, you must let them know that you were treated with yttrium-90 microspheres, and will be slightly radioactive.  They can call Interventional Radiology [phone] with any questions.

## 2016-12-13 NOTE — Progress Notes (Signed)
Patient ID: [name], female   DOB: May 14, 1934, 83 y.o.   MRN: [id number] Patient status post right hepatic lobe Y 90 radioembolization earlier today. During procedure patient developed chest pain as well as upper back pain. EKG was without acute changes. Troponin neg. Will obtain portable chest x-ray. Vital signs currently stable with O2 sats 97% 1 L nasal cannula .Patient received IV Dilaudid with improvement in pain score currently at 1-2 . She currently denies chest pain, headache, dyspnea, cough, nausea, vomiting, abdominal pain. She states her upper back pain radiates to both scapular regions. Exam -patient awake, alert. Chest clear to auscultation bilaterally. Heart with regular rate and rhythm. Abdomen soft, positive bowel sounds, nontender. Puncture site right common femoral artery soft, clean, dry, nontender, no hematoma. Intact distal pulses. Case discussed with Dr. Pascal Lux. Recommend [facility] admission for observation. Dr. Sloan Leiter has been contacted regarding patient and will admit for observation to telemetry floor.

## 2016-12-13 NOTE — Progress Notes (Signed)
Report given to [facility]. Patient being transferred to 1420 for observation.

## 2016-12-13 NOTE — Procedures (Signed)
Pre-procedure Diagnosis: Metastatic Neuroendocrine Cancer Post-procedure Diagnosis: Same  Post Y-90 radio-embolization of the replaced right lobe of the liver.    Complications: Shortly after beginning the embolization, pt began to complain of chest and back pain; As such the entire calculated dose was unable to be administered.  Note, post procedural EKG was negative for e/o ischemia.  EBL: None  Keep right leg straight for 4 hrs.    SignedSandi Mariscal Pager: [phone] 12/13/2016, 12:08 PM

## 2016-12-13 NOTE — Sedation Documentation (Signed)
Transferred pt to [facility].

## 2016-12-13 NOTE — Sedation Documentation (Signed)
28F sheath pulled at 1114 by Dr Francena Hanly. manual pressure held and hemostasis achieved. 2+ dorsal pedal pulses. Groin dressed and groin level zero

## 2016-12-13 NOTE — Sedation Documentation (Signed)
Pt c/o back pain 6/10

## 2016-12-13 NOTE — Progress Notes (Signed)
Brief pharmacy medication note:  Cycle 3, started 12/11/16   Xeloda '1500mg'$  twice daily days 1-14 (May 15-28)  temodar '280mg'$  once daily days 10-14 (May 24-28)  Cycle repeats every 4 weeks  Pt will need to supply xeloda if continued while inpatient  Dolly Rias RPh 12/13/2016, 5:10 PM Pager [phone]

## 2016-12-14 ENCOUNTER — Other Ambulatory Visit: Payer: Self-pay | Admitting: Radiology

## 2016-12-14 DIAGNOSIS — C7B8 Other secondary neuroendocrine tumors: Secondary | ICD-10-CM | POA: Diagnosis not present

## 2016-12-14 DIAGNOSIS — C787 Secondary malignant neoplasm of liver and intrahepatic bile duct: Secondary | ICD-10-CM | POA: Diagnosis not present

## 2016-12-14 DIAGNOSIS — Z993 Dependence on wheelchair: Secondary | ICD-10-CM | POA: Diagnosis not present

## 2016-12-14 DIAGNOSIS — I480 Paroxysmal atrial fibrillation: Secondary | ICD-10-CM | POA: Diagnosis not present

## 2016-12-14 DIAGNOSIS — C7B02 Secondary carcinoid tumors of liver: Secondary | ICD-10-CM | POA: Diagnosis not present

## 2016-12-14 DIAGNOSIS — M549 Dorsalgia, unspecified: Secondary | ICD-10-CM | POA: Diagnosis not present

## 2016-12-14 DIAGNOSIS — R079 Chest pain, unspecified: Secondary | ICD-10-CM | POA: Diagnosis not present

## 2016-12-14 DIAGNOSIS — C7A8 Other malignant neuroendocrine tumors: Secondary | ICD-10-CM | POA: Diagnosis not present

## 2016-12-14 DIAGNOSIS — G319 Degenerative disease of nervous system, unspecified: Secondary | ICD-10-CM | POA: Diagnosis not present

## 2016-12-14 DIAGNOSIS — Z8673 Personal history of transient ischemic attack (TIA), and cerebral infarction without residual deficits: Secondary | ICD-10-CM | POA: Diagnosis not present

## 2016-12-14 LAB — BASIC METABOLIC PANEL
Anion gap: 9 (ref 5–15)
BUN: 15 mg/dL (ref 6–20)
CO2: 24 mmol/L (ref 22–32)
CREATININE: 0.95 mg/dL (ref 0.44–1.00)
Calcium: 9.4 mg/dL (ref 8.9–10.3)
Chloride: 98 mmol/L — ABNORMAL LOW (ref 101–111)
GFR calc Af Amer: >60 mL/min (ref >60)
GFR, EST NON AFRICAN AMERICAN: 55 mL/min — AB (ref >60)
GLUCOSE: 149 mg/dL — AB (ref 65–99)
POTASSIUM: 4.6 mmol/L (ref 3.5–5.1)
Sodium: 131 mmol/L — ABNORMAL LOW (ref 135–145)

## 2016-12-14 LAB — TROPONIN I
Troponin I: 0.03 ng/mL (ref <0.03)
Troponin I: 0.04 ng/mL (ref <0.03)

## 2016-12-14 LAB — CBC
HEMATOCRIT: 40.3 % (ref 36.0–46.0)
Hemoglobin: 13.6 g/dL (ref 12.0–15.0)
MCH: 31.3 pg (ref 26.0–34.0)
MCHC: 33.7 g/dL (ref 30.0–36.0)
MCV: 92.9 fL (ref 78.0–100.0)
PLATELETS: 169 10*3/uL (ref 150–400)
RBC: 4.34 MIL/uL (ref 3.87–5.11)
RDW: 16.7 % — AB (ref 11.5–15.5)
WBC: 10.9 10*3/uL — ABNORMAL HIGH (ref 4.0–10.5)

## 2016-12-14 LAB — CHROMOGRANIN A: Chromogranin A: 15 nmol/L — ABNORMAL HIGH (ref 0–5)

## 2016-12-14 NOTE — Care Management Obs Status (Signed)
[facility] NOTIFICATION   Patient Details  Name: [name] MRN: [id number] Date of Birth: 1934/06/15   Medicare Observation Status Notification Given:  Yes    Guadalupe Maple, RN 12/14/2016, 11:45 AM

## 2016-12-14 NOTE — Care Management Note (Signed)
Case Management Note  Patient Details  Name: [name] MRN: [id number] Date of Birth: 09-26-1933  Subjective/Objective:                  83 y.o. female with medical history significant of neuroendocrine tumor of the lung with multiple liver metastases, prior history of CVA, history of cerebellar degeneration-mostly wheelchair-bound, hypothyroidism, paroxysmal atrial fibrillation who was seen by interventional radiology today and underwent right hepatic lobe Y 90 radioembolization, during the procedure, patient had transient back pain and chest pain. EKG and troponins were negative, chest x-ray did not show any acute abnormalities. Patient received IV Dilaudid with complete resolution of her pain  Action/Plan: Date:  Dec 14, 2016  Chart reviewed for concurrent status and case management needs.  Will continue to follow patient progress.  Discharge Planning: following for needs  Expected discharge date: [id number]  Rion Schnitzer, BSN, [place], [place]   Expected Discharge Date:                  Expected Discharge Plan:  Home/Self Care  In-House Referral:     Discharge planning Services  CM Consult  Post Acute Care Choice:    Choice offered to:     DME Arranged:    DME Agency:     HH Arranged:    HH Agency:     Status of Service:  In process, will continue to follow  If discussed at Long Length of Stay Meetings, dates discussed:    Additional Comments:  [name], [name], RN 12/14/2016, 9:59 AM

## 2016-12-14 NOTE — Progress Notes (Signed)
Referring Physician(s): Mohamed,M  Supervising Physician: Jacqulynn Cadet  Patient Status:  [name]  Chief Complaint: Metastatic neuroendocrine carcinoma to liver, back pain    Subjective: Pt feeling a little better today in regards to upper back pain but cont to have some; states pain will sometimes radiate to ant chest; denies N/V or sig dyspnea; eating ok; has voided   Allergies: Lisinopril  Medications: Prior to Admission medications   Medication Sig Start Date End Date Taking? Authorizing Provider  amitriptyline (ELAVIL) 25 MG tablet Take 1 tablet (25 mg total) by mouth at bedtime. 10/01/16  Yes Eustaquio Maize, MD  capecitabine (XELODA) 500 MG tablet Take 3 tablets (1,500 mg total) by mouth 2 (two) times daily after a meal. 12/05/16  Yes Curt Bears, MD  escitalopram (LEXAPRO) 20 MG tablet Take 1 tablet (20 mg total) by mouth at bedtime. 10/01/16  Yes Eustaquio Maize, MD  fluticasone (FLONASE) 50 MCG/ACT nasal spray Place 2 sprays into both nostrils at bedtime. 09/20/16  Yes Eustaquio Maize, MD  furosemide (LASIX) 40 MG tablet Take 40 mg by mouth daily as needed for edema.    Yes [provider]  gabapentin (NEURONTIN) 300 MG capsule Take 1-2 capsules (300-600 mg total) by mouth 4 (four) times daily. 08/23/16  Yes Eustaquio Maize, MD  hydroxypropyl methylcellulose / hypromellose (ISOPTO TEARS / GONIOVISC) 2.5 % ophthalmic solution Place 1 drop into both eyes at bedtime.   Yes [provider]  levothyroxine (SYNTHROID, LEVOTHROID) 75 MCG tablet TAKE 1 TABLET DAILY BEFORE BREAKFAST 09/25/16  Yes Evelina Dun A, FNP  losartan (COZAAR) 50 MG tablet TAKE 1 TABLET DAILY 07/17/16  Yes Evelina Dun A, FNP  lovastatin (MEVACOR) 40 MG tablet TAKE 1 TABLET AT BEDTIME 10/25/16  Yes Eustaquio Maize, MD  Melatonin 3 MG TBDP Take 3-6 mg by mouth at bedtime as needed. Patient taking differently: Take 3-6 mg by mouth at bedtime as needed (for sleep).  08/02/16   Yes Eustaquio Maize, MD  nitrofurantoin (MACRODANTIN) 50 MG capsule Take 50 mg by mouth at bedtime.   Yes [provider]  omeprazole (PRILOSEC) 40 MG capsule TAKE 1 CAPSULE DAILY 10/25/16  Yes Eustaquio Maize, MD  ondansetron (ZOFRAN) 8 MG tablet Take 1 tablet (8 mg total) by mouth every 8 (eight) hours as needed for nausea or vomiting. 10/09/16  Yes Curt Bears, MD  oxyCODONE-acetaminophen (PERCOCET) 5-325 MG tablet Take 1-2 tablets by mouth every 4 (four) hours as needed. 12/05/16  Yes Curt Bears, MD  temozolomide (TEMODAR) 140 MG capsule Take 2 capsules (280 mg total) by mouth daily. May take on an empty stomach or at bedtime to decrease nausea & vomiting. 12/05/16  Yes Curt Bears, MD  Vitamin D, Ergocalciferol, (DRISDOL) 50000 units CAPS capsule Take 1 capsule (50,000 Units total) by mouth every 7 (seven) days. Patient taking differently: Take 50,000 Units by mouth every Friday.  08/23/16  Yes Eustaquio Maize, MD     Vital Signs: BP (!) 142/56 (BP Location: Left Arm)   Pulse 97   Temp 97.4 F (36.3 C) (Oral)   Resp 20   Ht '5\' 4"'$  (1.626 m)   Wt 224 lb 3.3 oz (101.7 kg)   SpO2 97%   BMI 38.49 kg/m   Physical Exam awake/alert; chest- CTA bilat; heart- RRR; abd- soft,+BS, not sig tender; rt CFA puncture site soft, clean, dry,NT, no hematoma; intact distal pulses  Imaging: Nm Liver Img Spect  Result  Date: 12/13/2016 CLINICAL DATA:  Metastatic malignant neuroendocrine tumor to the liver. EXAM: NUCLEAR MEDICINE SPECIAL MED RAD PHYSICS CONS; NUCLEAR MEDICINE RADIO PHARM THERAPY INTRA ARTERIAL; NUCLEAR MEDICINE TREATMENT PROCEDURE; NUCLEAR MEDICINE LIVER SCAN TECHNIQUE: In conjunction with the interventional radiologist a Y- Microsphere dose was calculated utilizing body surface area formulation. Calculated dose equal 36.99 mCi. Pre therapy MAA liver SPECT scan and CTA were evaluated. Utilizing a microcatheter system, the hepatic artery was selected and Y-90 microspheres  were delivered in fractionated aliquots. Radiopharmaceutical was delivered by the interventional radiologist and nuclear radiologist. [name] through the infusion patient reported back pain and the patient's blood pressure was noted to be increased and the procedure was then terminated. The patient tolerated procedure well. No adverse effects were noted. Bremsstrahlung planar and SPECT imaging of the abdomen following intrahepatic arterial delivery of Y-90 microsphere was performed. RADIOPHARMACEUTICALS:  A total of 87.56 millicuries of Y- 90 microsphere administered. COMPARISON:  10/02/2016 FINDINGS: Y - 90 microspheres therapy as above. First therapy the right hepatic lobe. Bremsstrahlung planar and SPECT imaging of the abdomen following intrahepatic arterial delivery of Y-44mcrosphere demonstrates radioactivity localized to the right hepatic lobe. No evidence of extrahepatic activity. IMPRESSION: Successful Y - 90 microsphere delivery for treatment of unresectable liver metastasis. First therapy to the right lobe. Bremssstrahlung scan demonstrates activity localized to right hepatic lobe with no extrahepatic activity identified. Electronically Signed   By: TKerby MoorsM.D.   On: 12/13/2016 13:39   Ir Angiogram Visceral Selective  Result Date: 12/13/2016 INDICATION: History of metastatic neuroendocrine tumor. Patient returns today for left hepatic arteriogram and potential percutaneous coil embolization of a suspected falciform artery as well as replaced right hepatic arteriogram and radioembolization of the right lobe of the liver. EXAM: 1. CELIAC ARTERIOGRAM 2. SELECTIVE LEFT HEPATIC ARTERIOGRAM WITH THE ACQUISITION OF DYNA CT IMAGES 3. SUPERIOR MESENTERIC ARTERIOGRAM 4. SELECTIVE REPLACED RIGHT HEPATIC ARTERIOGRAM 5. TRANSCATHETER YTTRIUM-90 RADIOEMBOLIZATION OF THE REPLACED RIGHT HEPATIC ARTERY 6. FLUOROSCOPIC GUIDED ADMINISTRATION OF SIR-SPHERES 7. ULTRASOUND GUIDED VASCULAR ACCESS OF THE RIGHT COMMON  FEMORAL ARTERY COMPARISON:  Mapping pre Y 90 radioembolization - 12/13/2016; CT of the abdomen pelvis - 11/13/2016 MEDICATIONS: Protonix 40 mg IV; Decadron 4 mg IV; Toradol 30 mg IV; Sandostatin 200 mg IV CONTRAST:  125 cc Isovue-300 ANESTHESIA/SEDATION: Moderate (conscious) sedation was employed during this procedure. A total of Versed 2 mg and Fentanyl 100 mcg was administered intravenously. Moderate Sedation Time: 143 minutes. The patient's level of consciousness and vital signs were monitored continuously by radiology nursing throughout the procedure under my direct supervision. FLUOROSCOPY TIME:  12 minutes 12 seconds (3,316 mGy). ACCESS: Right common femoral artery; hemostasis achieved with manual compression. COMPLICATIONS: SIR LEVEL B - Normal therapy, includes overnight admission for observation. Patient develop chest and midline back pain during the radioembolization of the right lobe of the liver and the complete dose of SIR spheres was unable to be administered. Note, postprocedural ECG was negative for evidence of ischemia and post procedural labs were negative for a troponin elevation. TECHNIQUE: Informed written consent was obtained from the patient after a discussion of the risks, benefits and alternatives to treatment. Questions regarding the procedure were encouraged and answered. A timeout was performed prior to the initiation of the procedure. The right groin was prepped and draped in the usual sterile fashion, and a sterile drape was applied covering the operative field. Maximum barrier sterile technique with sterile gowns and gloves were used for the procedure. A timeout was performed prior to the  initiation of the procedure. Local anesthesia was provided with 1% lidocaine. The right femoral head was marked fluoroscopically. Under direct ultrasound guidance, the right common femoral artery was accessed with a micropuncture kit allowing placement of a of 5-French vascular sheath. An ultrasound  image was saved for documentation purposes. A limited arteriogram performed through the side arm of the sheath confirming appropriate access within the right common femoral artery. Over a Britta Mccreedy wire, a Mickelson catheter was advanced to the level of the inferior thoracic aorta where it was reformed, back bled and flushed. The Mickelson catheter was utilized to select the celiac artery and a celiac arteriogram was performed. With the use of a Fathom 14 micro wire, a regular Renegade microcatheter was advanced into the left hepatic artery and multiple selective left hepatic arteriograms were performed in various obliquities including the acquisition of Dyna CT images during selective left hepatic arteriogram. Attention was now paid towards radioembolization of the replaced right hepatic artery. The Ann Held was then utilized select the superior mesenteric artery and a selective superior mesenteric arteriogram was performed. With the use of a Fathom 14 micro wire, a high-flow Renegade microcatheter was advanced into the distal aspect of the replaced right hepatic artery at the level of the vessel's bifurcation. Contrast injection confirmed appropriate positioning. Radioembolization was then performed with Yttrium-90 SIR Spheres. Particles were administered via a microcatheter utilizing a completely enclosed system. Monitoring of antegrade flow was performed during and after the administration under fluoroscopy with use of contrast intermittently. After the initial dose of the radioembolization, the patient complained of the sudden onset of chest and midline back pain. This is associated with elevation of the patient's blood pressure. Contrast injection continue to demonstrate preserved antegrade flow, however the patient's symptoms persisted. As the patient's symptoms persisted despite temporary halting of the procedure, the decision was made to abandon the procedure prior to the full administration of the  radioembolization. Next, the microcatheter, outer catheter and administration system were then discarded into radiation safety receptacle. At this point, the procedure was terminated. The right common femoral approach vascular sheath was removed and hemostasis was achieved with manual compression. A dressing was placed. Postprocedural EKG was negative for evidence of ischemia in subsequent troponin levels were negative. All participants involved with the procedure were surveyed by the radiation safety officer prior to exiting the fluoroscopy suite. The patient was escorted to the nuclear medicine department for post-treatment imaging. 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 Multiple selective left hepatic arteriograms appear to demonstrate a potential falciform artery which could serve as the source of the non target MAA demonstrated on preceding nuclear medicine planning study. Dyna CT images failed to delineate the origin of this vessel however images are degraded secondary to patient body habitus. As the atretic vessel was attempted to be cannulated, the vessel apparently spasm and was no longer visualized. As such, percutaneous coil embolization of the presumed falciform artery was not performed. 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 Again, there are no definitive abnormal vessels arising from the replaced SMA via the intestines. Successful administration of a partial dose of SIR spheres from the distal aspect of the right SMA the level the vessel's bifurcation. Intermittent contrast injections during and after the radioembolization confirming preserved antegrade flow without reflux. As above, the complete calculated dose of SIR spheres was unable to be administered secondary to patient's development  of unexplained  chest and back pain shortly after the beginning of the radioembolization. IMPRESSION: Technically successful radioembolization of the replaced right hepatic artery with Yttrium-90 microspheres. PLAN: - Patient will be seen in the interventional radiology clinic in 3-4 weeks with the acquisition of LFTs. - As the patient tolerated today's procedure poorly, I am uncertain whether we will be able to proceed with completion radioembolization of the left lobe of the liver. Electronically Signed   By: Simonne Come M.D.   On: 12/13/2016 17:27   Ir Angiogram Visceral Selective  Result Date: 12/13/2016 INDICATION: History of metastatic neuroendocrine tumor. Patient returns today for left hepatic arteriogram and potential percutaneous coil embolization of a suspected falciform artery as well as replaced right hepatic arteriogram and radioembolization of the right lobe of the liver. EXAM: 1. CELIAC ARTERIOGRAM 2. SELECTIVE LEFT HEPATIC ARTERIOGRAM WITH THE ACQUISITION OF DYNA CT IMAGES 3. SUPERIOR MESENTERIC ARTERIOGRAM 4. SELECTIVE REPLACED RIGHT HEPATIC ARTERIOGRAM 5. TRANSCATHETER YTTRIUM-90 RADIOEMBOLIZATION OF THE REPLACED RIGHT HEPATIC ARTERY 6. FLUOROSCOPIC GUIDED ADMINISTRATION OF SIR-SPHERES 7. ULTRASOUND GUIDED VASCULAR ACCESS OF THE RIGHT COMMON FEMORAL ARTERY COMPARISON:  Mapping pre Y 90 radioembolization - 12/13/2016; CT of the abdomen pelvis - 11/13/2016 MEDICATIONS: Protonix 40 mg IV; Decadron 4 mg IV; Toradol 30 mg IV; Sandostatin 200 mg IV CONTRAST:  125 cc Isovue-300 ANESTHESIA/SEDATION: Moderate (conscious) sedation was employed during this procedure. A total of Versed 2 mg and Fentanyl 100 mcg was administered intravenously. Moderate Sedation Time: 143 minutes. The patient's level of consciousness and vital signs were monitored continuously by radiology nursing throughout the procedure under my direct supervision. FLUOROSCOPY TIME:  12 minutes 12 seconds (3,316 mGy). ACCESS: Right common femoral artery;  hemostasis achieved with manual compression. COMPLICATIONS: SIR LEVEL B - Normal therapy, includes overnight admission for observation. Patient develop chest and midline back pain during the radioembolization of the right lobe of the liver and the complete dose of SIR spheres was unable to be administered. Note, postprocedural ECG was negative for evidence of ischemia and post procedural labs were negative for a troponin elevation. TECHNIQUE: Informed written consent was obtained from the patient after a discussion of the risks, benefits and alternatives to treatment. Questions regarding the procedure were encouraged and answered. A timeout was performed prior to the initiation of the procedure. The right groin was prepped and draped in the usual sterile fashion, and a sterile drape was applied covering the operative field. Maximum barrier sterile technique with sterile gowns and gloves were used for the procedure. A timeout was performed prior to the initiation of the procedure. Local anesthesia was provided with 1% lidocaine. The right femoral head was marked fluoroscopically. Under direct ultrasound guidance, the right common femoral artery was accessed with a micropuncture kit allowing placement of a of 5-French vascular sheath. An ultrasound image was saved for documentation purposes. A limited arteriogram performed through the side arm of the sheath confirming appropriate access within the right common femoral artery. Over a Teena Dunk wire, a Mickelson catheter was advanced to the level of the inferior thoracic aorta where it was reformed, back bled and flushed. The Mickelson catheter was utilized to select the celiac artery and a celiac arteriogram was performed. With the use of a Fathom 14 micro wire, a regular Renegade microcatheter was advanced into the left hepatic artery and multiple selective left hepatic arteriograms were performed in various obliquities including the acquisition of Dyna CT images during  selective left hepatic arteriogram. Attention was now paid towards radioembolization of the  replaced right hepatic artery. The Ann Held was then utilized select the superior mesenteric artery and a selective superior mesenteric arteriogram was performed. With the use of a Fathom 14 micro wire, a high-flow Renegade microcatheter was advanced into the distal aspect of the replaced right hepatic artery at the level of the vessel's bifurcation. Contrast injection confirmed appropriate positioning. Radioembolization was then performed with Yttrium-90 SIR Spheres. Particles were administered via a microcatheter utilizing a completely enclosed system. Monitoring of antegrade flow was performed during and after the administration under fluoroscopy with use of contrast intermittently. After the initial dose of the radioembolization, the patient complained of the sudden onset of chest and midline back pain. This is associated with elevation of the patient's blood pressure. Contrast injection continue to demonstrate preserved antegrade flow, however the patient's symptoms persisted. As the patient's symptoms persisted despite temporary halting of the procedure, the decision was made to abandon the procedure prior to the full administration of the radioembolization. Next, the microcatheter, outer catheter and administration system were then discarded into radiation safety receptacle. At this point, the procedure was terminated. The right common femoral approach vascular sheath was removed and hemostasis was achieved with manual compression. A dressing was placed. Postprocedural EKG was negative for evidence of ischemia in subsequent troponin levels were negative. All participants involved with the procedure were surveyed by the radiation safety officer prior to exiting the fluoroscopy suite. The patient was escorted to the nuclear medicine department for post-treatment imaging. 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 Multiple selective left hepatic arteriograms appear to demonstrate a potential falciform artery which could serve as the source of the non target MAA demonstrated on preceding nuclear medicine planning study. Dyna CT images failed to delineate the origin of this vessel however images are degraded secondary to patient body habitus. As the atretic vessel was attempted to be cannulated, the vessel apparently spasm and was no longer visualized. As such, percutaneous coil embolization of the presumed falciform artery was not performed. 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 Again, there are no definitive abnormal vessels arising from the replaced SMA via the intestines. Successful administration of a partial dose of SIR spheres from the distal aspect of the right SMA the level the vessel's bifurcation. Intermittent contrast injections during and after the radioembolization confirming preserved antegrade flow without reflux. As above, the complete calculated dose of SIR spheres was unable to be administered secondary to patient's development of unexplained chest and back pain shortly after the beginning of the radioembolization. IMPRESSION: Technically successful radioembolization of the replaced right hepatic artery with Yttrium-90 microspheres. PLAN: - Patient will be seen in the interventional radiology clinic in 3-4 weeks with the acquisition of LFTs. - As the patient tolerated today's procedure poorly, I am uncertain whether we will be able to proceed with completion radioembolization of the left lobe of the liver. Electronically Signed   By: Sandi Mariscal M.D.   On: 12/13/2016 17:27   Ir Angiogram Selective Each Additional Vessel  Result Date: 12/13/2016 INDICATION: History of metastatic neuroendocrine tumor. Patient returns  today for left hepatic arteriogram and potential percutaneous coil embolization of a suspected falciform artery as well as replaced right hepatic arteriogram and radioembolization of the right lobe of the liver. EXAM: 1. CELIAC ARTERIOGRAM 2. SELECTIVE LEFT HEPATIC ARTERIOGRAM WITH THE ACQUISITION OF DYNA CT IMAGES 3. SUPERIOR MESENTERIC ARTERIOGRAM 4. SELECTIVE REPLACED  RIGHT HEPATIC ARTERIOGRAM 5. TRANSCATHETER YTTRIUM-90 RADIOEMBOLIZATION OF THE REPLACED RIGHT HEPATIC ARTERY 6. FLUOROSCOPIC GUIDED ADMINISTRATION OF SIR-SPHERES 7. ULTRASOUND GUIDED VASCULAR ACCESS OF THE RIGHT COMMON FEMORAL ARTERY COMPARISON:  Mapping pre Y 90 radioembolization - 12/13/2016; CT of the abdomen pelvis - 11/13/2016 MEDICATIONS: Protonix 40 mg IV; Decadron 4 mg IV; Toradol 30 mg IV; Sandostatin 200 mg IV CONTRAST:  125 cc Isovue-300 ANESTHESIA/SEDATION: Moderate (conscious) sedation was employed during this procedure. A total of Versed 2 mg and Fentanyl 100 mcg was administered intravenously. Moderate Sedation Time: 143 minutes. The patient's level of consciousness and vital signs were monitored continuously by radiology nursing throughout the procedure under my direct supervision. FLUOROSCOPY TIME:  12 minutes 12 seconds (3,316 mGy). ACCESS: Right common femoral artery; hemostasis achieved with manual compression. COMPLICATIONS: SIR LEVEL B - Normal therapy, includes overnight admission for observation. Patient develop chest and midline back pain during the radioembolization of the right lobe of the liver and the complete dose of SIR spheres was unable to be administered. Note, postprocedural ECG was negative for evidence of ischemia and post procedural labs were negative for a troponin elevation. TECHNIQUE: Informed written consent was obtained from the patient after a discussion of the risks, benefits and alternatives to treatment. Questions regarding the procedure were encouraged and answered. A timeout was performed prior to the  initiation of the procedure. The right groin was prepped and draped in the usual sterile fashion, and a sterile drape was applied covering the operative field. Maximum barrier sterile technique with sterile gowns and gloves were used for the procedure. A timeout was performed prior to the initiation of the procedure. Local anesthesia was provided with 1% lidocaine. The right femoral head was marked fluoroscopically. Under direct ultrasound guidance, the right common femoral artery was accessed with a micropuncture kit allowing placement of a of 5-French vascular sheath. An ultrasound image was saved for documentation purposes. A limited arteriogram performed through the side arm of the sheath confirming appropriate access within the right common femoral artery. Over a Teena Dunk wire, a Mickelson catheter was advanced to the level of the inferior thoracic aorta where it was reformed, back bled and flushed. The Mickelson catheter was utilized to select the celiac artery and a celiac arteriogram was performed. With the use of a Fathom 14 micro wire, a regular Renegade microcatheter was advanced into the left hepatic artery and multiple selective left hepatic arteriograms were performed in various obliquities including the acquisition of Dyna CT images during selective left hepatic arteriogram. Attention was now paid towards radioembolization of the replaced right hepatic artery. The Ardath Sax was then utilized select the superior mesenteric artery and a selective superior mesenteric arteriogram was performed. With the use of a Fathom 14 micro wire, a high-flow Renegade microcatheter was advanced into the distal aspect of the replaced right hepatic artery at the level of the vessel's bifurcation. Contrast injection confirmed appropriate positioning. Radioembolization was then performed with Yttrium-90 SIR Spheres. Particles were administered via a microcatheter utilizing a completely enclosed system. Monitoring of  antegrade flow was performed during and after the administration under fluoroscopy with use of contrast intermittently. After the initial dose of the radioembolization, the patient complained of the sudden onset of chest and midline back pain. This is associated with elevation of the patient's blood pressure. Contrast injection continue to demonstrate preserved antegrade flow, however the patient's symptoms persisted. As the patient's symptoms persisted despite temporary halting of the procedure, the decision was made to abandon the procedure prior to  the full administration of the radioembolization. Next, the microcatheter, outer catheter and administration system were then discarded into radiation safety receptacle. At this point, the procedure was terminated. The right common femoral approach vascular sheath was removed and hemostasis was achieved with manual compression. A dressing was placed. Postprocedural EKG was negative for evidence of ischemia in subsequent troponin levels were negative. All participants involved with the procedure were surveyed by the radiation safety officer prior to exiting the fluoroscopy suite. The patient was escorted to the nuclear medicine department for post-treatment imaging. 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 Multiple selective left hepatic arteriograms appear to demonstrate a potential falciform artery which could serve as the source of the non target MAA demonstrated on preceding nuclear medicine planning study. Dyna CT images failed to delineate the origin of this vessel however images are degraded secondary to patient body habitus. As the atretic vessel was attempted to be cannulated, the vessel apparently spasm and was no longer visualized. As such, percutaneous coil embolization of the presumed falciform artery was not performed. Selective superior  mesenteric arteriogram demonstrates a replaced right hepatic artery arising from the proximal SMA. Again, there are no definitive abnormal vessels arising from the replaced SMA via the intestines. Successful administration of a partial dose of SIR spheres from the distal aspect of the right SMA the level the vessel's bifurcation. Intermittent contrast injections during and after the radioembolization confirming preserved antegrade flow without reflux. As above, the complete calculated dose of SIR spheres was unable to be administered secondary to patient's development of unexplained chest and back pain shortly after the beginning of the radioembolization. IMPRESSION: Technically successful radioembolization of the replaced right hepatic artery with Yttrium-90 microspheres. PLAN: - Patient will be seen in the interventional radiology clinic in 3-4 weeks with the acquisition of LFTs. - As the patient tolerated today's procedure poorly, I am uncertain whether we will be able to proceed with completion radioembolization of the left lobe of the liver. Electronically Signed   By: Sandi Mariscal M.D.   On: 12/13/2016 17:27   Ir Angiogram Selective Each Additional Vessel  Result Date: 12/13/2016 INDICATION: History of metastatic neuroendocrine tumor. Patient returns today for left hepatic arteriogram and potential percutaneous coil embolization of a suspected falciform artery as well as replaced right hepatic arteriogram and radioembolization of the right lobe of the liver. EXAM: 1. CELIAC ARTERIOGRAM 2. SELECTIVE LEFT HEPATIC ARTERIOGRAM WITH THE ACQUISITION OF DYNA CT IMAGES 3. SUPERIOR MESENTERIC ARTERIOGRAM 4. SELECTIVE REPLACED RIGHT HEPATIC ARTERIOGRAM 5. TRANSCATHETER YTTRIUM-90 RADIOEMBOLIZATION OF THE REPLACED RIGHT HEPATIC ARTERY 6. FLUOROSCOPIC GUIDED ADMINISTRATION OF SIR-SPHERES 7. ULTRASOUND GUIDED VASCULAR ACCESS OF THE RIGHT COMMON FEMORAL ARTERY COMPARISON:  Mapping pre Y 90 radioembolization - 12/13/2016;  CT of the abdomen pelvis - 11/13/2016 MEDICATIONS: Protonix 40 mg IV; Decadron 4 mg IV; Toradol 30 mg IV; Sandostatin 200 mg IV CONTRAST:  125 cc Isovue-300 ANESTHESIA/SEDATION: Moderate (conscious) sedation was employed during this procedure. A total of Versed 2 mg and Fentanyl 100 mcg was administered intravenously. Moderate Sedation Time: 143 minutes. The patient's level of consciousness and vital signs were monitored continuously by radiology nursing throughout the procedure under my direct supervision. FLUOROSCOPY TIME:  12 minutes 12 seconds (3,316 mGy). ACCESS: Right common femoral artery; hemostasis achieved with manual compression. COMPLICATIONS: SIR LEVEL B - Normal therapy, includes overnight admission for observation. Patient develop chest and midline back pain during the radioembolization of the right  lobe of the liver and the complete dose of SIR spheres was unable to be administered. Note, postprocedural ECG was negative for evidence of ischemia and post procedural labs were negative for a troponin elevation. TECHNIQUE: Informed written consent was obtained from the patient after a discussion of the risks, benefits and alternatives to treatment. Questions regarding the procedure were encouraged and answered. A timeout was performed prior to the initiation of the procedure. The right groin was prepped and draped in the usual sterile fashion, and a sterile drape was applied covering the operative field. Maximum barrier sterile technique with sterile gowns and gloves were used for the procedure. A timeout was performed prior to the initiation of the procedure. Local anesthesia was provided with 1% lidocaine. The right femoral head was marked fluoroscopically. Under direct ultrasound guidance, the right common femoral artery was accessed with a micropuncture kit allowing placement of a of 5-French vascular sheath. An ultrasound image was saved for documentation purposes. A limited arteriogram performed  through the side arm of the sheath confirming appropriate access within the right common femoral artery. Over a Britta Mccreedy wire, a Mickelson catheter was advanced to the level of the inferior thoracic aorta where it was reformed, back bled and flushed. The Mickelson catheter was utilized to select the celiac artery and a celiac arteriogram was performed. With the use of a Fathom 14 micro wire, a regular Renegade microcatheter was advanced into the left hepatic artery and multiple selective left hepatic arteriograms were performed in various obliquities including the acquisition of Dyna CT images during selective left hepatic arteriogram. Attention was now paid towards radioembolization of the replaced right hepatic artery. The Ann Held was then utilized select the superior mesenteric artery and a selective superior mesenteric arteriogram was performed. With the use of a Fathom 14 micro wire, a high-flow Renegade microcatheter was advanced into the distal aspect of the replaced right hepatic artery at the level of the vessel's bifurcation. Contrast injection confirmed appropriate positioning. Radioembolization was then performed with Yttrium-90 SIR Spheres. Particles were administered via a microcatheter utilizing a completely enclosed system. Monitoring of antegrade flow was performed during and after the administration under fluoroscopy with use of contrast intermittently. After the initial dose of the radioembolization, the patient complained of the sudden onset of chest and midline back pain. This is associated with elevation of the patient's blood pressure. Contrast injection continue to demonstrate preserved antegrade flow, however the patient's symptoms persisted. As the patient's symptoms persisted despite temporary halting of the procedure, the decision was made to abandon the procedure prior to the full administration of the radioembolization. Next, the microcatheter, outer catheter and administration system  were then discarded into radiation safety receptacle. At this point, the procedure was terminated. The right common femoral approach vascular sheath was removed and hemostasis was achieved with manual compression. A dressing was placed. Postprocedural EKG was negative for evidence of ischemia in subsequent troponin levels were negative. All participants involved with the procedure were surveyed by the radiation safety officer prior to exiting the fluoroscopy suite. The patient was escorted to the nuclear medicine department for post-treatment imaging. 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 Multiple selective left hepatic arteriograms appear to demonstrate a potential falciform artery which could serve as the source of the non target MAA demonstrated on preceding nuclear medicine planning study. Dyna CT images failed to delineate the origin of this vessel however images are degraded secondary to patient  body habitus. As the atretic vessel was attempted to be cannulated, the vessel apparently spasm and was no longer visualized. As such, percutaneous coil embolization of the presumed falciform artery was not performed. 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 Again, there are no definitive abnormal vessels arising from the replaced SMA via the intestines. Successful administration of a partial dose of SIR spheres from the distal aspect of the right SMA the level the vessel's bifurcation. Intermittent contrast injections during and after the radioembolization confirming preserved antegrade flow without reflux. As above, the complete calculated dose of SIR spheres was unable to be administered secondary to patient's development of unexplained chest and back pain shortly after the beginning of the radioembolization. IMPRESSION:  Technically successful radioembolization of the replaced right hepatic artery with Yttrium-90 microspheres. PLAN: - Patient will be seen in the interventional radiology clinic in 3-4 weeks with the acquisition of LFTs. - As the patient tolerated today's procedure poorly, I am uncertain whether we will be able to proceed with completion radioembolization of the left lobe of the liver. Electronically Signed   By: Sandi Mariscal M.D.   On: 12/13/2016 17:27   Nm Special Med Rad Physics Cons  Result Date: 12/13/2016 CLINICAL DATA:  Metastatic malignant neuroendocrine tumor to the liver. EXAM: NUCLEAR MEDICINE SPECIAL MED RAD PHYSICS CONS; NUCLEAR MEDICINE RADIO PHARM THERAPY INTRA ARTERIAL; NUCLEAR MEDICINE TREATMENT PROCEDURE; NUCLEAR MEDICINE LIVER SCAN TECHNIQUE: In conjunction with the interventional radiologist a Y- Microsphere dose was calculated utilizing body surface area formulation. Calculated dose equal 36.99 mCi. Pre therapy MAA liver SPECT scan and CTA were evaluated. Utilizing a microcatheter system, the hepatic artery was selected and Y-90 microspheres were delivered in fractionated aliquots. Radiopharmaceutical was delivered by the interventional radiologist and nuclear radiologist. [name] through the infusion patient reported back pain and the patient's blood pressure was noted to be increased and the procedure was then terminated. The patient tolerated procedure well. No adverse effects were noted. Bremsstrahlung planar and SPECT imaging of the abdomen following intrahepatic arterial delivery of Y-90 microsphere was performed. RADIOPHARMACEUTICALS:  A total of 16.10 millicuries of Y- 90 microsphere administered. COMPARISON:  10/02/2016 FINDINGS: Y - 90 microspheres therapy as above. First therapy the right hepatic lobe. Bremsstrahlung planar and SPECT imaging of the abdomen following intrahepatic arterial delivery of Y-85mcrosphere demonstrates radioactivity localized to the right hepatic lobe. No  evidence of extrahepatic activity. IMPRESSION: Successful Y - 90 microsphere delivery for treatment of unresectable liver metastasis. First therapy to the right lobe. Bremssstrahlung scan demonstrates activity localized to right hepatic lobe with no extrahepatic activity identified. Electronically Signed   By: TKerby MoorsM.D.   On: 12/13/2016 13:39   Nm Special Treatment Procedure  Result Date: 12/13/2016 CLINICAL DATA:  Metastatic malignant neuroendocrine tumor to the liver. EXAM: NUCLEAR MEDICINE SPECIAL MED RAD PHYSICS CONS; NUCLEAR MEDICINE RADIO PHARM THERAPY INTRA ARTERIAL; NUCLEAR MEDICINE TREATMENT PROCEDURE; NUCLEAR MEDICINE LIVER SCAN TECHNIQUE: In conjunction with the interventional radiologist a Y- Microsphere dose was calculated utilizing body surface area formulation. Calculated dose equal 36.99 mCi. Pre therapy MAA liver SPECT scan and CTA were evaluated. Utilizing a microcatheter system, the hepatic artery was selected and Y-90 microspheres were delivered in fractionated aliquots. Radiopharmaceutical was delivered by the interventional radiologist and nuclear radiologist. MEpimenio Footthrough the infusion patient reported back pain and the patient's blood pressure was noted to be increased and the procedure was then terminated. The patient tolerated procedure well. No adverse effects  were noted. Bremsstrahlung planar and SPECT imaging of the abdomen following intrahepatic arterial delivery of Y-90 microsphere was performed. RADIOPHARMACEUTICALS:  A total of 18.97 millicuries of Y- 90 microsphere administered. COMPARISON:  10/02/2016 FINDINGS: Y - 90 microspheres therapy as above. First therapy the right hepatic lobe. Bremsstrahlung planar and SPECT imaging of the abdomen following intrahepatic arterial delivery of Y-71microsphere demonstrates radioactivity localized to the right hepatic lobe. No evidence of extrahepatic activity. IMPRESSION: Successful Y - 90 microsphere delivery for treatment of  unresectable liver metastasis. First therapy to the right lobe. Bremssstrahlung scan demonstrates activity localized to right hepatic lobe with no extrahepatic activity identified. Electronically Signed   By: Signa Kell M.D.   On: 12/13/2016 13:39   Ir US Guide Vasc Access Right  Result Date: 12/13/2016 INDICATION: History of metastatic neuroendocrine tumor. Patient returns today for left hepatic arteriogram and potential percutaneous coil embolization of a suspected falciform artery as well as replaced right hepatic arteriogram and radioembolization of the right lobe of the liver. EXAM: 1. CELIAC ARTERIOGRAM 2. SELECTIVE LEFT HEPATIC ARTERIOGRAM WITH THE ACQUISITION OF DYNA CT IMAGES 3. SUPERIOR MESENTERIC ARTERIOGRAM 4. SELECTIVE REPLACED RIGHT HEPATIC ARTERIOGRAM 5. TRANSCATHETER YTTRIUM-90 RADIOEMBOLIZATION OF THE REPLACED RIGHT HEPATIC ARTERY 6. FLUOROSCOPIC GUIDED ADMINISTRATION OF SIR-SPHERES 7. ULTRASOUND GUIDED VASCULAR ACCESS OF THE RIGHT COMMON FEMORAL ARTERY COMPARISON:  Mapping pre Y 90 radioembolization - 12/13/2016; CT of the abdomen pelvis - 11/13/2016 MEDICATIONS: Protonix 40 mg IV; Decadron 4 mg IV; Toradol 30 mg IV; Sandostatin 200 mg IV CONTRAST:  125 cc Isovue-300 ANESTHESIA/SEDATION: Moderate (conscious) sedation was employed during this procedure. A total of Versed 2 mg and Fentanyl 100 mcg was administered intravenously. Moderate Sedation Time: 143 minutes. The patient's level of consciousness and vital signs were monitored continuously by radiology nursing throughout the procedure under my direct supervision. FLUOROSCOPY TIME:  12 minutes 12 seconds (3,316 mGy). ACCESS: Right common femoral artery; hemostasis achieved with manual compression. COMPLICATIONS: SIR LEVEL B - Normal therapy, includes overnight admission for observation. Patient develop chest and midline back pain during the radioembolization of the right lobe of the liver and the complete dose of SIR spheres was unable to  be administered. Note, postprocedural ECG was negative for evidence of ischemia and post procedural labs were negative for a troponin elevation. TECHNIQUE: Informed written consent was obtained from the patient after a discussion of the risks, benefits and alternatives to treatment. Questions regarding the procedure were encouraged and answered. A timeout was performed prior to the initiation of the procedure. The right groin was prepped and draped in the usual sterile fashion, and a sterile drape was applied covering the operative field. Maximum barrier sterile technique with sterile gowns and gloves were used for the procedure. A timeout was performed prior to the initiation of the procedure. Local anesthesia was provided with 1% lidocaine. The right femoral head was marked fluoroscopically. Under direct ultrasound guidance, the right common femoral artery was accessed with a micropuncture kit allowing placement of a of 5-French vascular sheath. An ultrasound image was saved for documentation purposes. A limited arteriogram performed through the side arm of the sheath confirming appropriate access within the right common femoral artery. Over a Teena Dunk wire, a Mickelson catheter was advanced to the level of the inferior thoracic aorta where it was reformed, back bled and flushed. The Mickelson catheter was utilized to select the celiac artery and a celiac arteriogram was performed. With the use of a Fathom 14 micro wire, a regular Renegade microcatheter was advanced  into the left hepatic artery and multiple selective left hepatic arteriograms were performed in various obliquities including the acquisition of Dyna CT images during selective left hepatic arteriogram. Attention was now paid towards radioembolization of the replaced right hepatic artery. The Ann Held was then utilized select the superior mesenteric artery and a selective superior mesenteric arteriogram was performed. With the use of a Fathom 14 micro  wire, a high-flow Renegade microcatheter was advanced into the distal aspect of the replaced right hepatic artery at the level of the vessel's bifurcation. Contrast injection confirmed appropriate positioning. Radioembolization was then performed with Yttrium-90 SIR Spheres. Particles were administered via a microcatheter utilizing a completely enclosed system. Monitoring of antegrade flow was performed during and after the administration under fluoroscopy with use of contrast intermittently. After the initial dose of the radioembolization, the patient complained of the sudden onset of chest and midline back pain. This is associated with elevation of the patient's blood pressure. Contrast injection continue to demonstrate preserved antegrade flow, however the patient's symptoms persisted. As the patient's symptoms persisted despite temporary halting of the procedure, the decision was made to abandon the procedure prior to the full administration of the radioembolization. Next, the microcatheter, outer catheter and administration system were then discarded into radiation safety receptacle. At this point, the procedure was terminated. The right common femoral approach vascular sheath was removed and hemostasis was achieved with manual compression. A dressing was placed. Postprocedural EKG was negative for evidence of ischemia in subsequent troponin levels were negative. All participants involved with the procedure were surveyed by the radiation safety officer prior to exiting the fluoroscopy suite. The patient was escorted to the nuclear medicine department for post-treatment imaging. 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 Multiple selective left hepatic arteriograms appear to demonstrate a potential falciform artery which could serve as the source of the non target MAA demonstrated on preceding  nuclear medicine planning study. Dyna CT images failed to delineate the origin of this vessel however images are degraded secondary to patient body habitus. As the atretic vessel was attempted to be cannulated, the vessel apparently spasm and was no longer visualized. As such, percutaneous coil embolization of the presumed falciform artery was not performed. 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 Again, there are no definitive abnormal vessels arising from the replaced SMA via the intestines. Successful administration of a partial dose of SIR spheres from the distal aspect of the right SMA the level the vessel's bifurcation. Intermittent contrast injections during and after the radioembolization confirming preserved antegrade flow without reflux. As above, the complete calculated dose of SIR spheres was unable to be administered secondary to patient's development of unexplained chest and back pain shortly after the beginning of the radioembolization. IMPRESSION: Technically successful radioembolization of the replaced right hepatic artery with Yttrium-90 microspheres. PLAN: - Patient will be seen in the interventional radiology clinic in 3-4 weeks with the acquisition of LFTs. - As the patient tolerated today's procedure poorly, I am uncertain whether we will be able to proceed with completion radioembolization of the left lobe of the liver. Electronically Signed   By: Sandi Mariscal M.D.   On: 12/13/2016 17:27   Dg Chest Port 1 View  Result Date: 12/13/2016 CLINICAL DATA:  Metastatic neuroendocrine cancer EXAM: PORTABLE CHEST 1 VIEW COMPARISON:  None. FINDINGS: Loop recorder device projects over the left chest. Heart is borderline in  size. Mild vascular congestion. No confluent opacities or effusions. No acute bony abnormality. IMPRESSION: Borderline heart size.  Mild vascular congestion. Electronically Signed   By: Rolm Baptise M.D.    On: 12/13/2016 14:29   Ir Embo Tumor Organ Ischemia Infarct Inc Guide Roadmapping  Result Date: 12/13/2016 INDICATION: History of metastatic neuroendocrine tumor. Patient returns today for left hepatic arteriogram and potential percutaneous coil embolization of a suspected falciform artery as well as replaced right hepatic arteriogram and radioembolization of the right lobe of the liver. EXAM: 1. CELIAC ARTERIOGRAM 2. SELECTIVE LEFT HEPATIC ARTERIOGRAM WITH THE ACQUISITION OF DYNA CT IMAGES 3. SUPERIOR MESENTERIC ARTERIOGRAM 4. SELECTIVE REPLACED RIGHT HEPATIC ARTERIOGRAM 5. TRANSCATHETER YTTRIUM-90 RADIOEMBOLIZATION OF THE REPLACED RIGHT HEPATIC ARTERY 6. FLUOROSCOPIC GUIDED ADMINISTRATION OF SIR-SPHERES 7. ULTRASOUND GUIDED VASCULAR ACCESS OF THE RIGHT COMMON FEMORAL ARTERY COMPARISON:  Mapping pre Y 90 radioembolization - 12/13/2016; CT of the abdomen pelvis - 11/13/2016 MEDICATIONS: Protonix 40 mg IV; Decadron 4 mg IV; Toradol 30 mg IV; Sandostatin 200 mg IV CONTRAST:  125 cc Isovue-300 ANESTHESIA/SEDATION: Moderate (conscious) sedation was employed during this procedure. A total of Versed 2 mg and Fentanyl 100 mcg was administered intravenously. Moderate Sedation Time: 143 minutes. The patient's level of consciousness and vital signs were monitored continuously by radiology nursing throughout the procedure under my direct supervision. FLUOROSCOPY TIME:  12 minutes 12 seconds (3,316 mGy). ACCESS: Right common femoral artery; hemostasis achieved with manual compression. COMPLICATIONS: SIR LEVEL B - Normal therapy, includes overnight admission for observation. Patient develop chest and midline back pain during the radioembolization of the right lobe of the liver and the complete dose of SIR spheres was unable to be administered. Note, postprocedural ECG was negative for evidence of ischemia and post procedural labs were negative for a troponin elevation. TECHNIQUE: Informed written consent was obtained from  the patient after a discussion of the risks, benefits and alternatives to treatment. Questions regarding the procedure were encouraged and answered. A timeout was performed prior to the initiation of the procedure. The right groin was prepped and draped in the usual sterile fashion, and a sterile drape was applied covering the operative field. Maximum barrier sterile technique with sterile gowns and gloves were used for the procedure. A timeout was performed prior to the initiation of the procedure. Local anesthesia was provided with 1% lidocaine. The right femoral head was marked fluoroscopically. Under direct ultrasound guidance, the right common femoral artery was accessed with a micropuncture kit allowing placement of a of 5-French vascular sheath. An ultrasound image was saved for documentation purposes. A limited arteriogram performed through the side arm of the sheath confirming appropriate access within the right common femoral artery. Over a Britta Mccreedy wire, a Mickelson catheter was advanced to the level of the inferior thoracic aorta where it was reformed, back bled and flushed. The Mickelson catheter was utilized to select the celiac artery and a celiac arteriogram was performed. With the use of a Fathom 14 micro wire, a regular Renegade microcatheter was advanced into the left hepatic artery and multiple selective left hepatic arteriograms were performed in various obliquities including the acquisition of Dyna CT images during selective left hepatic arteriogram. Attention was now paid towards radioembolization of the replaced right hepatic artery. The Ann Held was then utilized select the superior mesenteric artery and a selective superior mesenteric arteriogram was performed. With the use of a Fathom 14 micro wire, a high-flow Renegade microcatheter was advanced into the distal aspect of the replaced right hepatic artery at  the level of the vessel's bifurcation. Contrast injection confirmed appropriate  positioning. Radioembolization was then performed with Yttrium-90 SIR Spheres. Particles were administered via a microcatheter utilizing a completely enclosed system. Monitoring of antegrade flow was performed during and after the administration under fluoroscopy with use of contrast intermittently. After the initial dose of the radioembolization, the patient complained of the sudden onset of chest and midline back pain. This is associated with elevation of the patient's blood pressure. Contrast injection continue to demonstrate preserved antegrade flow, however the patient's symptoms persisted. As the patient's symptoms persisted despite temporary halting of the procedure, the decision was made to abandon the procedure prior to the full administration of the radioembolization. Next, the microcatheter, outer catheter and administration system were then discarded into radiation safety receptacle. At this point, the procedure was terminated. The right common femoral approach vascular sheath was removed and hemostasis was achieved with manual compression. A dressing was placed. Postprocedural EKG was negative for evidence of ischemia in subsequent troponin levels were negative. All participants involved with the procedure were surveyed by the radiation safety officer prior to exiting the fluoroscopy suite. The patient was escorted to the nuclear medicine department for post-treatment imaging. 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 Multiple selective left hepatic arteriograms appear to demonstrate a potential falciform artery which could serve as the source of the non target MAA demonstrated on preceding nuclear medicine planning study. Dyna CT images failed to delineate the origin of this vessel however images are degraded secondary to patient body habitus. As the atretic vessel was attempted to be  cannulated, the vessel apparently spasm and was no longer visualized. As such, percutaneous coil embolization of the presumed falciform artery was not performed. 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 Again, there are no definitive abnormal vessels arising from the replaced SMA via the intestines. Successful administration of a partial dose of SIR spheres from the distal aspect of the right SMA the level the vessel's bifurcation. Intermittent contrast injections during and after the radioembolization confirming preserved antegrade flow without reflux. As above, the complete calculated dose of SIR spheres was unable to be administered secondary to patient's development of unexplained chest and back pain shortly after the beginning of the radioembolization. IMPRESSION: Technically successful radioembolization of the replaced right hepatic artery with Yttrium-90 microspheres. PLAN: - Patient will be seen in the interventional radiology clinic in 3-4 weeks with the acquisition of LFTs. - As the patient tolerated today's procedure poorly, I am uncertain whether we will be able to proceed with completion radioembolization of the left lobe of the liver. Electronically Signed   By: Simonne Come M.D.   On: 12/13/2016 17:27   Nm Radio Pharm Therapy Intraarterial  Result Date: 12/13/2016 CLINICAL DATA:  Metastatic malignant neuroendocrine tumor to the liver. EXAM: NUCLEAR MEDICINE SPECIAL MED RAD PHYSICS CONS; NUCLEAR MEDICINE RADIO PHARM THERAPY INTRA ARTERIAL; NUCLEAR MEDICINE TREATMENT PROCEDURE; NUCLEAR MEDICINE LIVER SCAN TECHNIQUE: In conjunction with the interventional radiologist a Y- Microsphere dose was calculated utilizing body surface area formulation. Calculated dose equal 36.99 mCi. Pre therapy MAA liver SPECT scan and CTA were evaluated. Utilizing a microcatheter system, the hepatic artery was selected and Y-90 microspheres were  delivered in fractionated aliquots. Radiopharmaceutical was delivered by the interventional radiologist and nuclear radiologist. Janan Halter through the infusion patient reported back pain and the patient's blood pressure was noted to be  increased and the procedure was then terminated. The patient tolerated procedure well. No adverse effects were noted. Bremsstrahlung planar and SPECT imaging of the abdomen following intrahepatic arterial delivery of Y-90 microsphere was performed. RADIOPHARMACEUTICALS:  A total of 17.79 millicuries of Y- 90 microsphere administered. COMPARISON:  10/02/2016 FINDINGS: Y - 90 microspheres therapy as above. First therapy the right hepatic lobe. Bremsstrahlung planar and SPECT imaging of the abdomen following intrahepatic arterial delivery of Y-65mcrosphere demonstrates radioactivity localized to the right hepatic lobe. No evidence of extrahepatic activity. IMPRESSION: Successful Y - 90 microsphere delivery for treatment of unresectable liver metastasis. First therapy to the right lobe. Bremssstrahlung scan demonstrates activity localized to right hepatic lobe with no extrahepatic activity identified. Electronically Signed   By: TKerby MoorsM.D.   On: 12/13/2016 13:39    Labs:  CBC:  Recent Labs  11/28/16 0743 12/05/16 1049 12/13/16 0801 12/14/16 0420  WBC 6.0 3.6* 4.1 10.9*  HGB 13.1 12.5 13.2 13.6  HCT 39.6 38.4 40.8 40.3  PLT 189 156 175 169    COAGS:  Recent Labs  08/20/16 1244 11/28/16 0743  INR 1.07 1.04  APTT 27  --     BMP:  Recent Labs  10/29/16 1222  11/28/16 0743 12/05/16 1049 12/13/16 0801 12/14/16 0420  NA 135  < > 137 139 136 131*  K 4.6  < > 4.5 4.5 4.3 4.6  CL 100*  --  102  --  101 98*  CO2 29  < > '27 25 27 24  '$ GLUCOSE 119*  < > 110* 122 100* 149*  BUN 13  < > 12 12.'9 15 15  '$ CALCIUM 9.7  < > 9.8 9.8 9.9 9.4  CREATININE 0.98  < > 0.90 0.9 0.95 0.95  GFRNONAA 53*  --  59*  --  55* 55*  GFRAA >60  --  >60  --  >60 >60  < > =  values in this interval not displayed.  LIVER FUNCTION TESTS:  Recent Labs  11/20/16 1423 11/28/16 0743 12/05/16 1049 12/13/16 0801  BILITOT 0.56 0.4 0.50 0.8  AST '20 26 17 26  '$ ALT '15 17 14 15  '$ ALKPHOS 86 81 78 72  PROT 7.3 7.6 6.7 7.6  ALBUMIN 3.9 4.0 3.6 4.1    Assessment and Plan: Pt with hx metastatic neuroendocrine ca to liver, s/p right hepatic lobe Y-90 radioembolization 5/17 with back /chest pain during procedure, ?MSK origin secondary to prolonged supine positioning during case on hard table; troponins with only mild if any elevation; CXR/EKG without acute changes;  AF; creat ok; WBC 10.9, hgb 13.6; pt will f/u with cards as OP; appreciate [facility] assistance with med management; she will f/u with Dr. WPascal Luxin IR clinic in 3-4 weeks prior to consideration of left lobe tx; oxycodone as OP for [facility] Electronically Signed: D. KRowe Robert PA-C 12/14/2016, 1:39 PM   I spent a total of 20 minutes at the the patient's bedside AND on the patient's hospital floor or unit, greater than 50% of which was counseling/coordinating care for right hepatic lobe Y-90 radioembolization    Patient ID: [name] female   DOB: 801/14/1935 83y.o.   MRN: [id number]

## 2016-12-14 NOTE — Progress Notes (Signed)
CRITICAL VALUE ALERT  Critical value received:  Trop 0.04  Date of notification:  12/14/16  Time of notification:  00:20  Critical value read back:Yes.    Nurse who received alert:  Ruben Im, RN  MD notified (1st page):  Hal Hope  Time of first page:  00:30  MD notified (2nd page):  Time of second page:  Responding MD: MD notified via text page, no new orders received   Time MD responded:

## 2016-12-14 NOTE — Discharge Summary (Signed)
Discharge Summary  [name]:[id number] DOB: [birth year]  PCP: Eustaquio Maize, MD  Admit date: 12/13/2016 Discharge date: 12/14/2016  Time spent: <59mns  Recommendations for Outpatient Follow-up:  1. F/u with PMD within a week  for hospital discharge follow up, repeat cbc/bmp at follow up 2. F/u with cardiology 3. F/u with oncology  Discharge Diagnoses:  Active Hospital Problems   Diagnosis Date Noted  . Chest pain 12/13/2016  . Back pain 12/13/2016  . [place] 12/13/2016  . Paroxysmal atrial fibrillation ([facility] 01/03/2015  . Morbid [place] ([facility] 01/03/2015    Resolved Hospital Problems   Diagnosis Date Noted Date Resolved  No resolved problems to display.    Discharge Condition: stable  Diet recommendation: heart healthy  Filed Weights   12/13/16 1614  Weight: 101.7 kg (224 lb 3.3 oz)    History of present illness:  Patient coming from: Interventional radiology   CHIEF COMPLAINT:  Chest pain/back pain while at interventional radiology-now resolved.  HPI: Rebekah DRUMMERis a 83y.o. female with medical history significant of neuroendocrine tumor of the lung with multiple liver metastases, prior history of CVA, history of cerebellar degeneration-mostly wheelchair-bound, hypothyroidism, paroxysmal atrial fibrillation who was seen by interventional radiology today and underwent right hepatic lobe Y 90 radioembolization, during the procedure, patient had transient back pain and chest pain. EKG and troponins were negative, chest x-ray did not show any acute abnormalities. Patient received IV Dilaudid with complete resolution of her pain. The hospitalist service was consulted for further evaluation.  During my evaluation, patient was lying comfortably in bed-her spouse at bedside. She had very minimal to no chest pain during my evaluation. She claims that the pain that she had when she was in IR, was sharp, in the  center of the chest and in her back. It has not reoccurred since then. There was no associated shortness of breath, nausea, vomiting.  Blood pressure in her right arm was 181/82, blood pressure in her left arm was 187/84 (this was done by me at bedside).  There is no history of nausea, vomiting, diarrhea, abdominal pain, shortness of breath, fever, headache.  Hospital Course:  Principal Problem:   Chest pain Active Problems:   Paroxysmal atrial fibrillation ([name])   Morbid obesity ([name])   Neuroendocrine carcinoma metastatic to liver ([name])   Back pain   Chest pain:  Tele unremarkable, troponin unremarkable. No hypoxia, no tachycardia, cxr possible mild vascular congestion, borderline heart size. With mostly atypical features-was very brief and is completely resolved. Could have been related to the procedure itself. Could be from acid reflux She is to follow up with cardiology as well  Uncontrolled hypertension:  initial on presentation her blood pressure was elevated, this partly due to she was npo for procedure and she did not take any of her morning meds that day, initial elevated blood pressure could also from the pain Blood pressure at goal at discharge, she is continued on her home bp meds.   neuroendocrine carcinoma with liver metastases:  She is on xeloda and temodar for this Continue follow with oncology and interventional radiology.   Paroxysmal atrial fibrillation with h/o bradycardia, s/p loop recorder placement Currently in sinus rhythm-she does not wish to be on anticoagulation. Reviewed prior notes-poor candidate for long-term anticoagulation given ataxia/cerebellar degeneration and risk of falls. Furthermore, patient claims she has telangiectasias and has been told by numerous MDs not to be on blood thinners. She is continued on her  home meds, she is to follow with cardiology and EP.  GERD: Continue PPI  Hypothyroidism: Continue levothyroxine  Obesity: Body  mass index is 38.49 kg/m.   Cerebellar degeneration: wheel chair bound for years. Patient declined home health PT/OT, agreed to home health RN.  CONSULTS: None  DVT Prophylaxis: SCD's  Code Status: Full Code  Disposition Plan:  home with home health RN Above noted plan was discussed with patient/family  face to face at bedside, they are in agreement.   Procedures: underwent right hepatic lobe Y 90 radioembolization, prior to the hospitalization  Consultations:  none  Discharge Exam: BP (!) 142/56 (BP Location: Left Arm)   Pulse 97   Temp 97.4 F (36.3 C) (Oral)   Resp 20   Ht _0  (1.626 m)   Wt 101.7 kg (224 lb 3.3 oz)   SpO2 97%   BMI 38.49 kg/m   General: chronically ill appearing Cardiovascular: RRR Respiratory: CTABL Abdomen: nontender, + bs Extremity: no edema  Discharge Instructions You were cared for by a hospitalist during your hospital stay. If you have any questions about your discharge medications or the care you received while you were in the hospital after you are discharged, you can call the unit and asked to speak with the hospitalist on call if the hospitalist that took care of you is not available. Once you are discharged, your primary care physician will handle any further medical issues. Please note that NO REFILLS for any discharge medications will be authorized once you are discharged, as it is imperative that you return to your primary care physician (or establish a relationship with a primary care physician if you do not have one) for your aftercare needs so that they can reassess your need for medications and monitor your lab values.  Discharge Instructions    Diet general    Complete by:  As directed    Face-to-face encounter (required for Medicare/Medicaid patients)    Complete by:  As directed    I Darey Hershberger certify that this patient is under my care and that I, or a nurse practitioner or physician's assistant working with me, had a  face-to-face encounter that meets the physician face-to-face encounter requirements with this patient on 12/14/2016. The encounter with the patient was in whole, or in part for the following medical condition(s) which is the primary reason for home health care (List medical condition): FTT   The encounter with the patient was in whole, or in part, for the following medical condition, which is the primary reason for home health care:  FTT   I certify that, based on my findings, the following services are medically necessary home health services:  Nursing   Reason for Medically Necessary Home Health Services:  Skilled Nursing- Change/Decline in Patient Status   My clinical findings support the need for the above services:  Unsafe ambulation due to balance issues   Further, I certify that my clinical findings support that this patient is homebound due to:  Unsafe ambulation due to balance issues   Home Health    Complete by:  As directed    To provide the following care/treatments:  RN   Increase activity slowly    Complete by:  As directed      Allergies as of 12/14/2016      Reactions   Lisinopril Cough      Medication List    TAKE these medications   amitriptyline 25 MG tablet Commonly known as:  ELAVIL Take  1 tablet (25 mg total) by mouth at bedtime.   capecitabine 500 MG tablet Commonly known as:  XELODA Take 3 tablets (1,500 mg total) by mouth 2 (two) times daily after a meal.   escitalopram 20 MG tablet Commonly known as:  LEXAPRO Take 1 tablet (20 mg total) by mouth at bedtime.   fluticasone 50 MCG/ACT nasal spray Commonly known as:  FLONASE Place 2 sprays into both nostrils at bedtime.   furosemide 40 MG tablet Commonly known as:  LASIX Take 40 mg by mouth daily as needed for edema.   gabapentin 300 MG capsule Commonly known as:  NEURONTIN Take 1-2 capsules (300-600 mg total) by mouth 4 (four) times daily.   hydroxypropyl methylcellulose / hypromellose 2.5 %  ophthalmic solution Commonly known as:  ISOPTO TEARS / GONIOVISC Place 1 drop into both eyes at bedtime.   levothyroxine 75 MCG tablet Commonly known as:  SYNTHROID, LEVOTHROID TAKE 1 TABLET DAILY BEFORE BREAKFAST   losartan 50 MG tablet Commonly known as:  COZAAR TAKE 1 TABLET DAILY   lovastatin 40 MG tablet Commonly known as:  MEVACOR TAKE 1 TABLET AT BEDTIME   Melatonin 3 MG Tbdp Take 3-6 mg by mouth at bedtime as needed. What changed:  reasons to take this   nitrofurantoin 50 MG capsule Commonly known as:  MACRODANTIN Take 50 mg by mouth at bedtime.   omeprazole 40 MG capsule Commonly known as:  PRILOSEC TAKE 1 CAPSULE DAILY   ondansetron 8 MG tablet Commonly known as:  ZOFRAN Take 1 tablet (8 mg total) by mouth every 8 (eight) hours as needed for nausea or vomiting.   oxyCODONE-acetaminophen 5-325 MG tablet Commonly known as:  PERCOCET Take 1-2 tablets by mouth every 4 (four) hours as needed.   temozolomide 140 MG capsule Commonly known as:  TEMODAR Take 2 capsules (280 mg total) by mouth daily. May take on an empty stomach or at bedtime to decrease nausea & vomiting.   Vitamin D (Ergocalciferol) 50000 units Caps capsule Commonly known as:  DRISDOL Take 1 capsule (50,000 Units total) by mouth every 7 (seven) days. What changed:  when to take this      Allergies  Allergen Reactions  . Lisinopril Cough   Follow-up Information    Eustaquio Maize, MD Follow up in 1 week(s).   Specialty:  Pediatrics Why:  hospital discharge follow up, repeat cbc/bmp at follow up. Contact information: [place] [address] [place] Follow up.   Why:  nurse to monitor blood pressure and medications Contact information: [place] [address] [phone]        Lorretta Harp, MD Follow up in 2 week(s).   Specialties:  Cardiology, Radiology Why:  for chest pain Contact  information: [address] [place] [address] [phone]            The results of significant diagnostics from this hospitalization (including imaging, microbiology, ancillary and laboratory) are listed below for reference.    Significant Diagnostic Studies: Nm Liver Img Spect  Result Date: 12/13/2016 CLINICAL DATA:  Metastatic malignant neuroendocrine tumor to the liver. EXAM: NUCLEAR MEDICINE SPECIAL MED RAD PHYSICS CONS; NUCLEAR MEDICINE RADIO PHARM THERAPY INTRA ARTERIAL; NUCLEAR MEDICINE TREATMENT PROCEDURE; NUCLEAR MEDICINE LIVER SCAN TECHNIQUE: In conjunction with the interventional radiologist a Y- Microsphere dose was calculated utilizing body surface area formulation. Calculated dose equal 36.99 mCi. Pre therapy MAA  liver SPECT scan and CTA were evaluated. Utilizing a microcatheter system, the hepatic artery was selected and Y-90 microspheres were delivered in fractionated aliquots. Radiopharmaceutical was delivered by the interventional radiologist and nuclear radiologist. [name] through the infusion patient reported back pain and the patient's blood pressure was noted to be increased and the procedure was then terminated. The patient tolerated procedure well. No adverse effects were noted. Bremsstrahlung planar and SPECT imaging of the abdomen following intrahepatic arterial delivery of Y-90 microsphere was performed. RADIOPHARMACEUTICALS:  A total of 78.29 millicuries of Y- 90 microsphere administered. COMPARISON:  10/02/2016 FINDINGS: Y - 90 microspheres therapy as above. First therapy the right hepatic lobe. Bremsstrahlung planar and SPECT imaging of the abdomen following intrahepatic arterial delivery of Y-4mcrosphere demonstrates radioactivity localized to the right hepatic lobe. No evidence of extrahepatic activity. IMPRESSION: Successful Y - 90 microsphere delivery for treatment of unresectable liver metastasis. First therapy to the right lobe.  Bremssstrahlung scan demonstrates activity localized to right hepatic lobe with no extrahepatic activity identified. Electronically Signed   By: TKerby MoorsM.D.   On: 12/13/2016 13:39   Nm Liver Img Spect  Result Date: 11/28/2016 CLINICAL DATA:  metastatic malignant neuroendocrine tumor to the liver. EXAM: NUCLEAR MEDICINE LIVER SCAN TECHNIQUE: Abdominal images were obtained in multiple projections after intrahepatic arterial injection of radiopharmaceutical. SPECT imaging was performed. Lung shunt calculation was performed. RADIOPHARMACEUTICALS:  4.8 MCI TC-56M MAA COMPARISON:  11/13/2016 FINDINGS: The injected microaggregated albumin localizes within the liver. There is a area of non target activity identified ventral and caudal to the inferior margin of the liver. Findings may reflect reflux of radiotracer activity into falciform artery. Calculated shunt fraction to the lungs equals 7.8%. IMPRESSION: 1. There is a focal area of non target activity just ventral and caudal to the inferior margin of the liver which may reflect reflux radiotracer activity away from the liver. Findings were discussed with Dr. WPascal Luxat the time of interpretation. 2. Lung shunt fraction equals 7.8% Electronically Signed   By: TKerby MoorsM.D.   On: 11/28/2016 13:20   Ir Angiogram Visceral Selective  Result Date: 12/13/2016 INDICATION: History of metastatic neuroendocrine tumor. Patient returns today for left hepatic arteriogram and potential percutaneous coil embolization of a suspected falciform artery as well as replaced right hepatic arteriogram and radioembolization of the right lobe of the liver. EXAM: 1. CELIAC ARTERIOGRAM 2. SELECTIVE LEFT HEPATIC ARTERIOGRAM WITH THE ACQUISITION OF DYNA CT IMAGES 3. SUPERIOR MESENTERIC ARTERIOGRAM 4. SELECTIVE REPLACED RIGHT HEPATIC ARTERIOGRAM 5. TRANSCATHETER YTTRIUM-90 RADIOEMBOLIZATION OF THE REPLACED RIGHT HEPATIC ARTERY 6. FLUOROSCOPIC GUIDED ADMINISTRATION OF SIR-SPHERES 7.  ULTRASOUND GUIDED VASCULAR ACCESS OF THE RIGHT COMMON FEMORAL ARTERY COMPARISON:  Mapping pre Y 90 radioembolization - 12/13/2016; CT of the abdomen pelvis - 11/13/2016 MEDICATIONS: Protonix 40 mg IV; Decadron 4 mg IV; Toradol 30 mg IV; Sandostatin 200 mg IV CONTRAST:  125 cc Isovue-300 ANESTHESIA/SEDATION: Moderate (conscious) sedation was employed during this procedure. A total of Versed 2 mg and Fentanyl 100 mcg was administered intravenously. Moderate Sedation Time: 143 minutes. The patient's level of consciousness and vital signs were monitored continuously by radiology nursing throughout the procedure under my direct supervision. FLUOROSCOPY TIME:  12 minutes 12 seconds (3,316 mGy). ACCESS: Right common femoral artery; hemostasis achieved with manual compression. COMPLICATIONS: SIR LEVEL B - Normal therapy, includes overnight admission for observation. Patient develop chest and midline back pain during the radioembolization of the right lobe of the liver and the complete dose of SIR spheres was  unable to be administered. Note, postprocedural ECG was negative for evidence of ischemia and post procedural labs were negative for a troponin elevation. TECHNIQUE: Informed written consent was obtained from the patient after a discussion of the risks, benefits and alternatives to treatment. Questions regarding the procedure were encouraged and answered. A timeout was performed prior to the initiation of the procedure. The right groin was prepped and draped in the usual sterile fashion, and a sterile drape was applied covering the operative field. Maximum barrier sterile technique with sterile gowns and gloves were used for the procedure. A timeout was performed prior to the initiation of the procedure. Local anesthesia was provided with 1% lidocaine. The right femoral head was marked fluoroscopically. Under direct ultrasound guidance, the right common femoral artery was accessed with a micropuncture kit allowing  placement of a of 5-French vascular sheath. An ultrasound image was saved for documentation purposes. A limited arteriogram performed through the side arm of the sheath confirming appropriate access within the right common femoral artery. Over a Britta Mccreedy wire, a Mickelson catheter was advanced to the level of the inferior thoracic aorta where it was reformed, back bled and flushed. The Mickelson catheter was utilized to select the celiac artery and a celiac arteriogram was performed. With the use of a Fathom 14 micro wire, a regular Renegade microcatheter was advanced into the left hepatic artery and multiple selective left hepatic arteriograms were performed in various obliquities including the acquisition of Dyna CT images during selective left hepatic arteriogram. Attention was now paid towards radioembolization of the replaced right hepatic artery. The Ann Held was then utilized select the superior mesenteric artery and a selective superior mesenteric arteriogram was performed. With the use of a Fathom 14 micro wire, a high-flow Renegade microcatheter was advanced into the distal aspect of the replaced right hepatic artery at the level of the vessel's bifurcation. Contrast injection confirmed appropriate positioning. Radioembolization was then performed with Yttrium-90 SIR Spheres. Particles were administered via a microcatheter utilizing a completely enclosed system. Monitoring of antegrade flow was performed during and after the administration under fluoroscopy with use of contrast intermittently. After the initial dose of the radioembolization, the patient complained of the sudden onset of chest and midline back pain. This is associated with elevation of the patient's blood pressure. Contrast injection continue to demonstrate preserved antegrade flow, however the patient's symptoms persisted. As the patient's symptoms persisted despite temporary halting of the procedure, the decision was made to abandon the  procedure prior to the full administration of the radioembolization. Next, the microcatheter, outer catheter and administration system were then discarded into radiation safety receptacle. At this point, the procedure was terminated. The right common femoral approach vascular sheath was removed and hemostasis was achieved with manual compression. A dressing was placed. Postprocedural EKG was negative for evidence of ischemia in subsequent troponin levels were negative. All participants involved with the procedure were surveyed by the radiation safety officer prior to exiting the fluoroscopy suite. The patient was escorted to the nuclear medicine department for post-treatment imaging. 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 Multiple selective left hepatic arteriograms appear to demonstrate a potential falciform artery which could serve as the source of the non target MAA demonstrated on preceding nuclear medicine planning study. Dyna CT images failed to delineate the origin of this vessel however images are degraded secondary to patient body habitus. As the atretic vessel was attempted to be cannulated, the  vessel apparently spasm and was no longer visualized. As such, percutaneous coil embolization of the presumed falciform artery was not performed. 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 Again, there are no definitive abnormal vessels arising from the replaced SMA via the intestines. Successful administration of a partial dose of SIR spheres from the distal aspect of the right SMA the level the vessel's bifurcation. Intermittent contrast injections during and after the radioembolization confirming preserved antegrade flow without reflux. As above, the complete calculated dose of SIR spheres was unable to be administered  secondary to patient's development of unexplained chest and back pain shortly after the beginning of the radioembolization. IMPRESSION: Technically successful radioembolization of the replaced right hepatic artery with Yttrium-90 microspheres. PLAN: - Patient will be seen in the interventional radiology clinic in 3-4 weeks with the acquisition of LFTs. - As the patient tolerated today's procedure poorly, I am uncertain whether we will be able to proceed with completion radioembolization of the left lobe of the liver. Electronically Signed   By: Sandi Mariscal M.D.   On: 12/13/2016 17:27   Ir Angiogram Visceral Selective  Result Date: 12/13/2016 INDICATION: History of metastatic neuroendocrine tumor. Patient returns today for left hepatic arteriogram and potential percutaneous coil embolization of a suspected falciform artery as well as replaced right hepatic arteriogram and radioembolization of the right lobe of the liver. EXAM: 1. CELIAC ARTERIOGRAM 2. SELECTIVE LEFT HEPATIC ARTERIOGRAM WITH THE ACQUISITION OF DYNA CT IMAGES 3. SUPERIOR MESENTERIC ARTERIOGRAM 4. SELECTIVE REPLACED RIGHT HEPATIC ARTERIOGRAM 5. TRANSCATHETER YTTRIUM-90 RADIOEMBOLIZATION OF THE REPLACED RIGHT HEPATIC ARTERY 6. FLUOROSCOPIC GUIDED ADMINISTRATION OF SIR-SPHERES 7. ULTRASOUND GUIDED VASCULAR ACCESS OF THE RIGHT COMMON FEMORAL ARTERY COMPARISON:  Mapping pre Y 90 radioembolization - 12/13/2016; CT of the abdomen pelvis - 11/13/2016 MEDICATIONS: Protonix 40 mg IV; Decadron 4 mg IV; Toradol 30 mg IV; Sandostatin 200 mg IV CONTRAST:  125 cc Isovue-300 ANESTHESIA/SEDATION: Moderate (conscious) sedation was employed during this procedure. A total of Versed 2 mg and Fentanyl 100 mcg was administered intravenously. Moderate Sedation Time: 143 minutes. The patient's level of consciousness and vital signs were monitored continuously by radiology nursing throughout the procedure under my direct supervision. FLUOROSCOPY TIME:  12 minutes 12 seconds  (3,316 mGy). ACCESS: Right common femoral artery; hemostasis achieved with manual compression. COMPLICATIONS: SIR LEVEL B - Normal therapy, includes overnight admission for observation. Patient develop chest and midline back pain during the radioembolization of the right lobe of the liver and the complete dose of SIR spheres was unable to be administered. Note, postprocedural ECG was negative for evidence of ischemia and post procedural labs were negative for a troponin elevation. TECHNIQUE: Informed written consent was obtained from the patient after a discussion of the risks, benefits and alternatives to treatment. Questions regarding the procedure were encouraged and answered. A timeout was performed prior to the initiation of the procedure. The right groin was prepped and draped in the usual sterile fashion, and a sterile drape was applied covering the operative field. Maximum barrier sterile technique with sterile gowns and gloves were used for the procedure. A timeout was performed prior to the initiation of the procedure. Local anesthesia was provided with 1% lidocaine. The right femoral head was marked fluoroscopically. Under direct ultrasound guidance, the right common femoral artery was accessed with a micropuncture kit allowing placement of a of 5-French vascular sheath. An ultrasound image was saved for documentation purposes. A limited arteriogram performed through the side arm  of the sheath confirming appropriate access within the right common femoral artery. Over a Britta Mccreedy wire, a Mickelson catheter was advanced to the level of the inferior thoracic aorta where it was reformed, back bled and flushed. The Mickelson catheter was utilized to select the celiac artery and a celiac arteriogram was performed. With the use of a Fathom 14 micro wire, a regular Renegade microcatheter was advanced into the left hepatic artery and multiple selective left hepatic arteriograms were performed in various obliquities  including the acquisition of Dyna CT images during selective left hepatic arteriogram. Attention was now paid towards radioembolization of the replaced right hepatic artery. The Ann Held was then utilized select the superior mesenteric artery and a selective superior mesenteric arteriogram was performed. With the use of a Fathom 14 micro wire, a high-flow Renegade microcatheter was advanced into the distal aspect of the replaced right hepatic artery at the level of the vessel's bifurcation. Contrast injection confirmed appropriate positioning. Radioembolization was then performed with Yttrium-90 SIR Spheres. Particles were administered via a microcatheter utilizing a completely enclosed system. Monitoring of antegrade flow was performed during and after the administration under fluoroscopy with use of contrast intermittently. After the initial dose of the radioembolization, the patient complained of the sudden onset of chest and midline back pain. This is associated with elevation of the patient's blood pressure. Contrast injection continue to demonstrate preserved antegrade flow, however the patient's symptoms persisted. As the patient's symptoms persisted despite temporary halting of the procedure, the decision was made to abandon the procedure prior to the full administration of the radioembolization. Next, the microcatheter, outer catheter and administration system were then discarded into radiation safety receptacle. At this point, the procedure was terminated. The right common femoral approach vascular sheath was removed and hemostasis was achieved with manual compression. A dressing was placed. Postprocedural EKG was negative for evidence of ischemia in subsequent troponin levels were negative. All participants involved with the procedure were surveyed by the radiation safety officer prior to exiting the fluoroscopy suite. The patient was escorted to the nuclear medicine department for post-treatment  imaging. 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 Multiple selective left hepatic arteriograms appear to demonstrate a potential falciform artery which could serve as the source of the non target MAA demonstrated on preceding nuclear medicine planning study. Dyna CT images failed to delineate the origin of this vessel however images are degraded secondary to patient body habitus. As the atretic vessel was attempted to be cannulated, the vessel apparently spasm and was no longer visualized. As such, percutaneous coil embolization of the presumed falciform artery was not performed. 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 Again, there are no definitive abnormal vessels arising from the replaced SMA via the intestines. Successful administration of a partial dose of SIR spheres from the distal aspect of the right SMA the level the vessel's bifurcation. Intermittent contrast injections during and after the radioembolization confirming preserved antegrade flow without reflux. As above, the complete calculated dose of SIR spheres was unable to be administered secondary to patient's development of unexplained chest and back pain shortly after the beginning of the radioembolization. IMPRESSION: Technically successful radioembolization of the replaced right hepatic artery with Yttrium-90 microspheres. PLAN: - Patient will be seen in the interventional radiology clinic in 3-4 weeks with the acquisition of LFTs. - As the patient tolerated today's procedure poorly, I am uncertain whether  we will be able to proceed with completion radioembolization of the left lobe of the liver. Electronically Signed   By: Sandi Mariscal M.D.   On: 12/13/2016 17:27   Ir Angiogram Visceral Selective  Result Date: 11/28/2016 INDICATION: History of metastatic  neuroendocrine tumor. Patient presents today for mapping Y 90 Radioembolization. Please refer to formal consultation in the epic EMR on 11/06/2016 for additional details. EXAM: 1. ULTRASOUND GUIDANCE FOR ARTERIAL ACCESS 2. CELIAC AND SUPERIOR MESENTERIC ARTERIOGRAM (1st ORDER) 3. SELECTIVE RIGHT HEPATIC ARTERIOGRAM (3rd ORDER) AND ADMINISTRATION OF Tc73mMAA 4. SELECTIVE RIGHT GASTRIC ARTERIOGRAM (3rd ORDER) AND PERCUTANEOUS COIL EMBOLIZATION. 5. SELECTIVE LEFT HEPATIC ARTERIOGRAM (3rd ORDER) AND ADMINISTRATION OF Tc920mAA COMPARISON:  CTA of the abdomen pelvis - 11/13/2016; MEDICATIONS: None RADIOPHARMACEUTICALS:  2.5 mCi Tc9961mA administered via the replaced right hepatic artery; 2.5 mCi Tc99m10m administered via the left hepatic artery CONTRAST:  86 cc Isovue 300 ANESTHESIA/SEDATION: Moderate (conscious) sedation was employed during this procedure. A total of Versed 4 mg and Fentanyl 200 mcg was administered intravenously. Moderate Sedation Time: 60 minutes. The patient's level of consciousness and vital signs were monitored continuously by radiology nursing throughout the procedure under my direct supervision. FLUOROSCOPY TIME:  14 minutes 42 seconds (2,185 mGy) ACCESS: Right common femoral artery; hemostasis achieved with manual compression. COMPLICATIONS: None immediate. TECHNIQUE: Informed written consent was obtained from the patient after a discussion of the risks, benefits and alternatives to treatment. Questions regarding the procedure were encouraged and answered. A timeout was performed prior to the initiation of the procedure. The right groin was prepped and draped in the usual sterile fashion, and a sterile drape was applied covering the operative field. Maximum barrier sterile technique with sterile gowns and gloves were used for the procedure. A timeout was performed prior to the initiation of the procedure. Local anesthesia was provided with 1% lidocaine. The right femoral head was marked  fluoroscopically. Under ultrasound guidance, the right common femoral artery was accessed with a micropuncture kit after the overlying soft tissues were anesthetized with 1% lidocaine. An ultrasound image was saved for documentation purposes. The micropuncture sheath was exchanged for a 5 FrenPakistancular sheath over a Bentson wire. A closure arteriogram was performed through the side of the sheath confirming access within the right common femoral artery. Over a Bentson wire, a Mickelson catheter was advanced to the level of the thoracic aorta where it was back bled and flushed. The catheter was then utilized to select the superior mesenteric artery and a superior mesenteric arteriogram was performed. Utilizing a Fathom 14 microwire, a regular Renegade microcatheter was advanced into the replaced right hepatic artery and a selective right hepatic arteriogram was performed. Next, 2.5 mCi of technetium 99 MAA was administered for this location. The microcatheter was removed from the patient and placed in the radiation receptacle. Next, the Mickelson catheter was then advanced cranially and utilized to select the celiac artery and a celiac arteriogram was performed. Utilizing a combination of a Fathom 14 and transcend microwires, a regular renegade micro catheter was utilized to select the left hepatic artery and a left hepatic arteriogram was performed. Next, the microcatheter was utilized select the right gastric artery. A selective right a gastric arteriogram was performed and the right gastric artery was percutaneously coil embolized with multiple overlapping 2 mm diameter interlock coils. The microcatheter was withdrawn into the left hepatic artery and 8 post embolization arteriogram was performed. Microcatheter was advanced into the distal aspect of the left hepatic  artery and a selective left hepatic arteriogram was performed. Next, 2.5 mCi of technetium 99 MAA was administered for this location. The  microcatheter was removed from the patient and placed in the radiation receptacle. At this point, the procedure was terminated. All wires, catheters and sheaths were removed from the patient. Hemostasis was achieved at the right groin and access site with manual compression. A dressing was placed. The patient tolerated procedure well without immediate postprocedural complication. The patient was escorted to nuclear medicine department for planar imaging. FINDINGS: Selective superior mesenteric arteriogram and demonstrates a replaced right hepatic artery arising from the proximal trunk of the SMA as demonstrated on preceding CTA. Selective right hepatic arteriogram was negative for presence of extrahepatic vascular contribution. Approximately 2.5 mCi of technetium 99 MAA was administered for this location. Left hepatic arteriogram confirms contribution to the right gastric artery. Selective percutaneous coil embolization of a right gastric artery to near the vessels origin. Conventional takeoff of the gastroduodenal artery, however the takeoff was remote from the planned administered location of Y 90 and thus NOT embolized. Approximately 2.5 mCi of technetium 99 MAA was administered from the distal aspect of the left hepatic artery. IMPRESSION: 1. Successful pre Y-90 arteriogram with percutaneous coil embolization of the right gastric artery. 2. Non conventional anatomy with a replaced right hepatic artery arising from the proximal SMA. 3. Successful administration of 2.5 mCi of technetium 61mMAA via the replaced right hepatic artery. 4. Successful administration of 2.5 mCi of technetium 942mAA via the left hepatic artery. PLAN: Pending the results of the nuclear medicine liver scan, the patient will return Y 90 radioembolization of the replaced right hepatic artery. Pending the patient's recovery from this initial treatment session, the patient will ultimately return for radioembolization of the left hepatic  artery. Electronically Signed   By: JoSandi Mariscal.D.   On: 11/28/2016 13:09   Ir Angiogram Visceral Selective  Result Date: 11/28/2016 INDICATION: History of metastatic neuroendocrine tumor. Patient presents today for mapping Y 90 Radioembolization. Please refer to formal consultation in the epic EMR on 11/06/2016 for additional details. EXAM: 1. ULTRASOUND GUIDANCE FOR ARTERIAL ACCESS 2. CELIAC AND SUPERIOR MESENTERIC ARTERIOGRAM (1st ORDER) 3. SELECTIVE RIGHT HEPATIC ARTERIOGRAM (3rd ORDER) AND ADMINISTRATION OF Tc9980mA 4. SELECTIVE RIGHT GASTRIC ARTERIOGRAM (3rd ORDER) AND PERCUTANEOUS COIL EMBOLIZATION. 5. SELECTIVE LEFT HEPATIC ARTERIOGRAM (3rd ORDER) AND ADMINISTRATION OF Tc99m58m COMPARISON:  CTA of the abdomen pelvis - 11/13/2016; MEDICATIONS: None RADIOPHARMACEUTICALS:  2.5 mCi Tc99m 85madministered via the replaced right hepatic artery; 2.5 mCi Tc99m M63mdministered via the left hepatic artery CONTRAST:  86 cc Isovue 300 ANESTHESIA/SEDATION: Moderate (conscious) sedation was employed during this procedure. A total of Versed 4 mg and Fentanyl 200 mcg was administered intravenously. Moderate Sedation Time: 60 minutes. The patient's level of consciousness and vital signs were monitored continuously by radiology nursing throughout the procedure under my direct supervision. FLUOROSCOPY TIME:  14 minutes 42 seconds (2,185 mGy) ACCESS: Right common femoral artery; hemostasis achieved with manual compression. COMPLICATIONS: None immediate. TECHNIQUE: Informed written consent was obtained from the patient after a discussion of the risks, benefits and alternatives to treatment. Questions regarding the procedure were encouraged and answered. A timeout was performed prior to the initiation of the procedure. The right groin was prepped and draped in the usual sterile fashion, and a sterile drape was applied covering the operative field. Maximum barrier sterile technique with sterile gowns and gloves were used  for the procedure. A timeout was  performed prior to the initiation of the procedure. Local anesthesia was provided with 1% lidocaine. The right femoral head was marked fluoroscopically. Under ultrasound guidance, the right common femoral artery was accessed with a micropuncture kit after the overlying soft tissues were anesthetized with 1% lidocaine. An ultrasound image was saved for documentation purposes. The micropuncture sheath was exchanged for a 5 [place] vascular sheath over a Bentson wire. A closure arteriogram was performed through the side of the sheath confirming access within the right common femoral artery. Over a Bentson wire, a Mickelson catheter was advanced to the level of the thoracic aorta where it was back bled and flushed. The catheter was then utilized to select the superior mesenteric artery and a superior mesenteric arteriogram was performed. Utilizing a Fathom 14 microwire, a regular Renegade microcatheter was advanced into the replaced right hepatic artery and a selective right hepatic arteriogram was performed. Next, 2.5 mCi of technetium 99 MAA was administered for this location. The microcatheter was removed from the patient and placed in the radiation receptacle. Next, the Mickelson catheter was then advanced cranially and utilized to select the celiac artery and a celiac arteriogram was performed. Utilizing a combination of a Fathom 14 and transcend microwires, a regular renegade micro catheter was utilized to select the left hepatic artery and a left hepatic arteriogram was performed. Next, the microcatheter was utilized select the right gastric artery. A selective right a gastric arteriogram was performed and the right gastric artery was percutaneously coil embolized with multiple overlapping 2 mm diameter interlock coils. The microcatheter was withdrawn into the left hepatic artery and 8 post embolization arteriogram was performed. Microcatheter was advanced into the distal aspect of  the left hepatic artery and a selective left hepatic arteriogram was performed. Next, 2.5 mCi of technetium 99 MAA was administered for this location. The microcatheter was removed from the patient and placed in the radiation receptacle. At this point, the procedure was terminated. All wires, catheters and sheaths were removed from the patient. Hemostasis was achieved at the right groin and access site with manual compression. A dressing was placed. The patient tolerated procedure well without immediate postprocedural complication. The patient was escorted to nuclear medicine department for planar imaging. FINDINGS: Selective superior mesenteric arteriogram and demonstrates a replaced right hepatic artery arising from the proximal trunk of the SMA as demonstrated on preceding CTA. Selective right hepatic arteriogram was negative for presence of extrahepatic vascular contribution. Approximately 2.5 mCi of technetium 99 MAA was administered for this location. Left hepatic arteriogram confirms contribution to the right gastric artery. Selective percutaneous coil embolization of a right gastric artery to near the vessels origin. Conventional takeoff of the gastroduodenal artery, however the takeoff was remote from the planned administered location of Y 90 and thus NOT embolized. Approximately 2.5 mCi of technetium 99 MAA was administered from the distal aspect of the left hepatic artery. IMPRESSION: 1. Successful pre Y-90 arteriogram with percutaneous coil embolization of the right gastric artery. 2. Non conventional anatomy with a replaced right hepatic artery arising from the proximal SMA. 3. Successful administration of 2.5 mCi of technetium 6mMAA via the replaced right hepatic artery. 4. Successful administration of 2.5 mCi of technetium 955mAA via the left hepatic artery. PLAN: Pending the results of the nuclear medicine liver scan, the patient will return Y 90 radioembolization of the replaced right hepatic  artery. Pending the patient's recovery from this initial treatment session, the patient will ultimately return for radioembolization of the left hepatic  artery. Electronically Signed   By: Sandi Mariscal M.D.   On: 11/28/2016 13:09   Ir Angiogram Selective Each Additional Vessel  Result Date: 12/13/2016 INDICATION: History of metastatic neuroendocrine tumor. Patient returns today for left hepatic arteriogram and potential percutaneous coil embolization of a suspected falciform artery as well as replaced right hepatic arteriogram and radioembolization of the right lobe of the liver. EXAM: 1. CELIAC ARTERIOGRAM 2. SELECTIVE LEFT HEPATIC ARTERIOGRAM WITH THE ACQUISITION OF DYNA CT IMAGES 3. SUPERIOR MESENTERIC ARTERIOGRAM 4. SELECTIVE REPLACED RIGHT HEPATIC ARTERIOGRAM 5. TRANSCATHETER YTTRIUM-90 RADIOEMBOLIZATION OF THE REPLACED RIGHT HEPATIC ARTERY 6. FLUOROSCOPIC GUIDED ADMINISTRATION OF SIR-SPHERES 7. ULTRASOUND GUIDED VASCULAR ACCESS OF THE RIGHT COMMON FEMORAL ARTERY COMPARISON:  Mapping pre Y 90 radioembolization - 12/13/2016; CT of the abdomen pelvis - 11/13/2016 MEDICATIONS: Protonix 40 mg IV; Decadron 4 mg IV; Toradol 30 mg IV; Sandostatin 200 mg IV CONTRAST:  125 cc Isovue-300 ANESTHESIA/SEDATION: Moderate (conscious) sedation was employed during this procedure. A total of Versed 2 mg and Fentanyl 100 mcg was administered intravenously. Moderate Sedation Time: 143 minutes. The patient's level of consciousness and vital signs were monitored continuously by radiology nursing throughout the procedure under my direct supervision. FLUOROSCOPY TIME:  12 minutes 12 seconds (3,316 mGy). ACCESS: Right common femoral artery; hemostasis achieved with manual compression. COMPLICATIONS: SIR LEVEL B - Normal therapy, includes overnight admission for observation. Patient develop chest and midline back pain during the radioembolization of the right lobe of the liver and the complete dose of SIR spheres was unable to be  administered. Note, postprocedural ECG was negative for evidence of ischemia and post procedural labs were negative for a troponin elevation. TECHNIQUE: Informed written consent was obtained from the patient after a discussion of the risks, benefits and alternatives to treatment. Questions regarding the procedure were encouraged and answered. A timeout was performed prior to the initiation of the procedure. The right groin was prepped and draped in the usual sterile fashion, and a sterile drape was applied covering the operative field. Maximum barrier sterile technique with sterile gowns and gloves were used for the procedure. A timeout was performed prior to the initiation of the procedure. Local anesthesia was provided with 1% lidocaine. The right femoral head was marked fluoroscopically. Under direct ultrasound guidance, the right common femoral artery was accessed with a micropuncture kit allowing placement of a of 5-French vascular sheath. An ultrasound image was saved for documentation purposes. A limited arteriogram performed through the side arm of the sheath confirming appropriate access within the right common femoral artery. Over a Britta Mccreedy wire, a Mickelson catheter was advanced to the level of the inferior thoracic aorta where it was reformed, back bled and flushed. The Mickelson catheter was utilized to select the celiac artery and a celiac arteriogram was performed. With the use of a Fathom 14 micro wire, a regular Renegade microcatheter was advanced into the left hepatic artery and multiple selective left hepatic arteriograms were performed in various obliquities including the acquisition of Dyna CT images during selective left hepatic arteriogram. Attention was now paid towards radioembolization of the replaced right hepatic artery. The Ann Held was then utilized select the superior mesenteric artery and a selective superior mesenteric arteriogram was performed. With the use of a Fathom 14 micro  wire, a high-flow Renegade microcatheter was advanced into the distal aspect of the replaced right hepatic artery at the level of the vessel's bifurcation. Contrast injection confirmed appropriate positioning. Radioembolization was then performed with Yttrium-90 SIR Spheres. Particles were administered via  a microcatheter utilizing a completely enclosed system. Monitoring of antegrade flow was performed during and after the administration under fluoroscopy with use of contrast intermittently. After the initial dose of the radioembolization, the patient complained of the sudden onset of chest and midline back pain. This is associated with elevation of the patient's blood pressure. Contrast injection continue to demonstrate preserved antegrade flow, however the patient's symptoms persisted. As the patient's symptoms persisted despite temporary halting of the procedure, the decision was made to abandon the procedure prior to the full administration of the radioembolization. Next, the microcatheter, outer catheter and administration system were then discarded into radiation safety receptacle. At this point, the procedure was terminated. The right common femoral approach vascular sheath was removed and hemostasis was achieved with manual compression. A dressing was placed. Postprocedural EKG was negative for evidence of ischemia in subsequent troponin levels were negative. All participants involved with the procedure were surveyed by the radiation safety officer prior to exiting the fluoroscopy suite. The patient was escorted to the nuclear medicine department for post-treatment imaging. 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 Multiple selective left hepatic arteriograms appear to demonstrate a potential falciform artery which could serve as the source of the non target MAA demonstrated on preceding  nuclear medicine planning study. Dyna CT images failed to delineate the origin of this vessel however images are degraded secondary to patient body habitus. As the atretic vessel was attempted to be cannulated, the vessel apparently spasm and was no longer visualized. As such, percutaneous coil embolization of the presumed falciform artery was not performed. 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 Again, there are no definitive abnormal vessels arising from the replaced SMA via the intestines. Successful administration of a partial dose of SIR spheres from the distal aspect of the right SMA the level the vessel's bifurcation. Intermittent contrast injections during and after the radioembolization confirming preserved antegrade flow without reflux. As above, the complete calculated dose of SIR spheres was unable to be administered secondary to patient's development of unexplained chest and back pain shortly after the beginning of the radioembolization. IMPRESSION: Technically successful radioembolization of the replaced right hepatic artery with Yttrium-90 microspheres. PLAN: - Patient will be seen in the interventional radiology clinic in 3-4 weeks with the acquisition of LFTs. - As the patient tolerated today's procedure poorly, I am uncertain whether we will be able to proceed with completion radioembolization of the left lobe of the liver. Electronically Signed   By: Sandi Mariscal M.D.   On: 12/13/2016 17:27   Ir Angiogram Selective Each Additional Vessel  Result Date: 12/13/2016 INDICATION: History of metastatic neuroendocrine tumor. Patient returns today for left hepatic arteriogram and potential percutaneous coil embolization of a suspected falciform artery as well as replaced right hepatic arteriogram and radioembolization of the right lobe of the liver. EXAM: 1. CELIAC ARTERIOGRAM 2. SELECTIVE LEFT HEPATIC ARTERIOGRAM WITH THE  ACQUISITION OF DYNA CT IMAGES 3. SUPERIOR MESENTERIC ARTERIOGRAM 4. SELECTIVE REPLACED RIGHT HEPATIC ARTERIOGRAM 5. TRANSCATHETER YTTRIUM-90 RADIOEMBOLIZATION OF THE REPLACED RIGHT HEPATIC ARTERY 6. FLUOROSCOPIC GUIDED ADMINISTRATION OF SIR-SPHERES 7. ULTRASOUND GUIDED VASCULAR ACCESS OF THE RIGHT COMMON FEMORAL ARTERY COMPARISON:  Mapping pre Y 90 radioembolization - 12/13/2016; CT of the abdomen pelvis - 11/13/2016 MEDICATIONS: Protonix 40 mg IV; Decadron 4 mg IV; Toradol 30 mg IV; Sandostatin 200 mg IV CONTRAST:  125 cc Isovue-300 ANESTHESIA/SEDATION: Moderate (conscious)  sedation was employed during this procedure. A total of Versed 2 mg and Fentanyl 100 mcg was administered intravenously. Moderate Sedation Time: 143 minutes. The patient's level of consciousness and vital signs were monitored continuously by radiology nursing throughout the procedure under my direct supervision. FLUOROSCOPY TIME:  12 minutes 12 seconds (3,316 mGy). ACCESS: Right common femoral artery; hemostasis achieved with manual compression. COMPLICATIONS: SIR LEVEL B - Normal therapy, includes overnight admission for observation. Patient develop chest and midline back pain during the radioembolization of the right lobe of the liver and the complete dose of SIR spheres was unable to be administered. Note, postprocedural ECG was negative for evidence of ischemia and post procedural labs were negative for a troponin elevation. TECHNIQUE: Informed written consent was obtained from the patient after a discussion of the risks, benefits and alternatives to treatment. Questions regarding the procedure were encouraged and answered. A timeout was performed prior to the initiation of the procedure. The right groin was prepped and draped in the usual sterile fashion, and a sterile drape was applied covering the operative field. Maximum barrier sterile technique with sterile gowns and gloves were used for the procedure. A timeout was performed prior to  the initiation of the procedure. Local anesthesia was provided with 1% lidocaine. The right femoral head was marked fluoroscopically. Under direct ultrasound guidance, the right common femoral artery was accessed with a micropuncture kit allowing placement of a of 5-French vascular sheath. An ultrasound image was saved for documentation purposes. A limited arteriogram performed through the side arm of the sheath confirming appropriate access within the right common femoral artery. Over a Britta Mccreedy wire, a Mickelson catheter was advanced to the level of the inferior thoracic aorta where it was reformed, back bled and flushed. The Mickelson catheter was utilized to select the celiac artery and a celiac arteriogram was performed. With the use of a Fathom 14 micro wire, a regular Renegade microcatheter was advanced into the left hepatic artery and multiple selective left hepatic arteriograms were performed in various obliquities including the acquisition of Dyna CT images during selective left hepatic arteriogram. Attention was now paid towards radioembolization of the replaced right hepatic artery. The Ann Held was then utilized select the superior mesenteric artery and a selective superior mesenteric arteriogram was performed. With the use of a Fathom 14 micro wire, a high-flow Renegade microcatheter was advanced into the distal aspect of the replaced right hepatic artery at the level of the vessel's bifurcation. Contrast injection confirmed appropriate positioning. Radioembolization was then performed with Yttrium-90 SIR Spheres. Particles were administered via a microcatheter utilizing a completely enclosed system. Monitoring of antegrade flow was performed during and after the administration under fluoroscopy with use of contrast intermittently. After the initial dose of the radioembolization, the patient complained of the sudden onset of chest and midline back pain. This is associated with elevation of the patient's  blood pressure. Contrast injection continue to demonstrate preserved antegrade flow, however the patient's symptoms persisted. As the patient's symptoms persisted despite temporary halting of the procedure, the decision was made to abandon the procedure prior to the full administration of the radioembolization. Next, the microcatheter, outer catheter and administration system were then discarded into radiation safety receptacle. At this point, the procedure was terminated. The right common femoral approach vascular sheath was removed and hemostasis was achieved with manual compression. A dressing was placed. Postprocedural EKG was negative for evidence of ischemia in subsequent troponin levels were negative. All participants involved with the procedure were surveyed by  the radiation safety officer prior to exiting the fluoroscopy suite. The patient was escorted to the nuclear medicine department for post-treatment imaging. 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 Multiple selective left hepatic arteriograms appear to demonstrate a potential falciform artery which could serve as the source of the non target MAA demonstrated on preceding nuclear medicine planning study. Dyna CT images failed to delineate the origin of this vessel however images are degraded secondary to patient body habitus. As the atretic vessel was attempted to be cannulated, the vessel apparently spasm and was no longer visualized. As such, percutaneous coil embolization of the presumed falciform artery was not performed. 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 Again, there are no definitive abnormal vessels arising from the replaced SMA via the intestines. Successful administration of a partial dose of SIR spheres from the distal aspect of the right SMA  the level the vessel's bifurcation. Intermittent contrast injections during and after the radioembolization confirming preserved antegrade flow without reflux. As above, the complete calculated dose of SIR spheres was unable to be administered secondary to patient's development of unexplained chest and back pain shortly after the beginning of the radioembolization. IMPRESSION: Technically successful radioembolization of the replaced right hepatic artery with Yttrium-90 microspheres. PLAN: - Patient will be seen in the interventional radiology clinic in 3-4 weeks with the acquisition of LFTs. - As the patient tolerated today's procedure poorly, I am uncertain whether we will be able to proceed with completion radioembolization of the left lobe of the liver. Electronically Signed   By: Sandi Mariscal M.D.   On: 12/13/2016 17:27   Ir Angiogram Selective Each Additional Vessel  Result Date: 11/28/2016 INDICATION: History of metastatic neuroendocrine tumor. Patient presents today for mapping Y 90 Radioembolization. Please refer to formal consultation in the epic EMR on 11/06/2016 for additional details. EXAM: 1. ULTRASOUND GUIDANCE FOR ARTERIAL ACCESS 2. CELIAC AND SUPERIOR MESENTERIC ARTERIOGRAM (1st ORDER) 3. SELECTIVE RIGHT HEPATIC ARTERIOGRAM (3rd ORDER) AND ADMINISTRATION OF Tc39mMAA 4. SELECTIVE RIGHT GASTRIC ARTERIOGRAM (3rd ORDER) AND PERCUTANEOUS COIL EMBOLIZATION. 5. SELECTIVE LEFT HEPATIC ARTERIOGRAM (3rd ORDER) AND ADMINISTRATION OF Tc939mAA COMPARISON:  CTA of the abdomen pelvis - 11/13/2016; MEDICATIONS: None RADIOPHARMACEUTICALS:  2.5 mCi Tc999mA administered via the replaced right hepatic artery; 2.5 mCi Tc99m47m administered via the left hepatic artery CONTRAST:  86 cc Isovue 300 ANESTHESIA/SEDATION: Moderate (conscious) sedation was employed during this procedure. A total of Versed 4 mg and Fentanyl 200 mcg was administered intravenously. Moderate Sedation Time: 60 minutes. The patient's level of  consciousness and vital signs were monitored continuously by radiology nursing throughout the procedure under my direct supervision. FLUOROSCOPY TIME:  14 minutes 42 seconds (2,185 mGy) ACCESS: Right common femoral artery; hemostasis achieved with manual compression. COMPLICATIONS: None immediate. TECHNIQUE: Informed written consent was obtained from the patient after a discussion of the risks, benefits and alternatives to treatment. Questions regarding the procedure were encouraged and answered. A timeout was performed prior to the initiation of the procedure. The right groin was prepped and draped in the usual sterile fashion, and a sterile drape was applied covering the operative field. Maximum barrier sterile technique with sterile gowns and gloves were used for the procedure. A timeout was performed prior to the initiation of the procedure. Local anesthesia was provided with 1% lidocaine. The right femoral head was marked fluoroscopically. Under ultrasound guidance, the right  common femoral artery was accessed with a micropuncture kit after the overlying soft tissues were anesthetized with 1% lidocaine. An ultrasound image was saved for documentation purposes. The micropuncture sheath was exchanged for a 5 [place] vascular sheath over a Bentson wire. A closure arteriogram was performed through the side of the sheath confirming access within the right common femoral artery. Over a Bentson wire, a Mickelson catheter was advanced to the level of the thoracic aorta where it was back bled and flushed. The catheter was then utilized to select the superior mesenteric artery and a superior mesenteric arteriogram was performed. Utilizing a Fathom 14 microwire, a regular Renegade microcatheter was advanced into the replaced right hepatic artery and a selective right hepatic arteriogram was performed. Next, 2.5 mCi of technetium 99 MAA was administered for this location. The microcatheter was removed from the patient and  placed in the radiation receptacle. Next, the Mickelson catheter was then advanced cranially and utilized to select the celiac artery and a celiac arteriogram was performed. Utilizing a combination of a Fathom 14 and transcend microwires, a regular renegade micro catheter was utilized to select the left hepatic artery and a left hepatic arteriogram was performed. Next, the microcatheter was utilized select the right gastric artery. A selective right a gastric arteriogram was performed and the right gastric artery was percutaneously coil embolized with multiple overlapping 2 mm diameter interlock coils. The microcatheter was withdrawn into the left hepatic artery and 8 post embolization arteriogram was performed. Microcatheter was advanced into the distal aspect of the left hepatic artery and a selective left hepatic arteriogram was performed. Next, 2.5 mCi of technetium 99 MAA was administered for this location. The microcatheter was removed from the patient and placed in the radiation receptacle. At this point, the procedure was terminated. All wires, catheters and sheaths were removed from the patient. Hemostasis was achieved at the right groin and access site with manual compression. A dressing was placed. The patient tolerated procedure well without immediate postprocedural complication. The patient was escorted to nuclear medicine department for planar imaging. FINDINGS: Selective superior mesenteric arteriogram and demonstrates a replaced right hepatic artery arising from the proximal trunk of the SMA as demonstrated on preceding CTA. Selective right hepatic arteriogram was negative for presence of extrahepatic vascular contribution. Approximately 2.5 mCi of technetium 99 MAA was administered for this location. Left hepatic arteriogram confirms contribution to the right gastric artery. Selective percutaneous coil embolization of a right gastric artery to near the vessels origin. Conventional takeoff of the  gastroduodenal artery, however the takeoff was remote from the planned administered location of Y 90 and thus NOT embolized. Approximately 2.5 mCi of technetium 99 MAA was administered from the distal aspect of the left hepatic artery. IMPRESSION: 1. Successful pre Y-90 arteriogram with percutaneous coil embolization of the right gastric artery. 2. Non conventional anatomy with a replaced right hepatic artery arising from the proximal SMA. 3. Successful administration of 2.5 mCi of technetium 42mMAA via the replaced right hepatic artery. 4. Successful administration of 2.5 mCi of technetium 974mAA via the left hepatic artery. PLAN: Pending the results of the nuclear medicine liver scan, the patient will return Y 90 radioembolization of the replaced right hepatic artery. Pending the patient's recovery from this initial treatment session, the patient will ultimately return for radioembolization of the left hepatic artery. Electronically Signed   By: JoSandi Mariscal.D.   On: 11/28/2016 13:09   Ir Angiogram Selective Each Additional Vessel  Result Date: 11/28/2016  INDICATION: History of metastatic neuroendocrine tumor. Patient presents today for mapping Y 90 Radioembolization. Please refer to formal consultation in the epic EMR on 11/06/2016 for additional details. EXAM: 1. ULTRASOUND GUIDANCE FOR ARTERIAL ACCESS 2. CELIAC AND SUPERIOR MESENTERIC ARTERIOGRAM (1st ORDER) 3. SELECTIVE RIGHT HEPATIC ARTERIOGRAM (3rd ORDER) AND ADMINISTRATION OF Tc33mMAA 4. SELECTIVE RIGHT GASTRIC ARTERIOGRAM (3rd ORDER) AND PERCUTANEOUS COIL EMBOLIZATION. 5. SELECTIVE LEFT HEPATIC ARTERIOGRAM (3rd ORDER) AND ADMINISTRATION OF Tc940mAA COMPARISON:  CTA of the abdomen pelvis - 11/13/2016; MEDICATIONS: None RADIOPHARMACEUTICALS:  2.5 mCi Tc9943mA administered via the replaced right hepatic artery; 2.5 mCi Tc99m31m administered via the left hepatic artery CONTRAST:  86 cc Isovue 300 ANESTHESIA/SEDATION: Moderate (conscious) sedation was  employed during this procedure. A total of Versed 4 mg and Fentanyl 200 mcg was administered intravenously. Moderate Sedation Time: 60 minutes. The patient's level of consciousness and vital signs were monitored continuously by radiology nursing throughout the procedure under my direct supervision. FLUOROSCOPY TIME:  14 minutes 42 seconds (2,185 mGy) ACCESS: Right common femoral artery; hemostasis achieved with manual compression. COMPLICATIONS: None immediate. TECHNIQUE: Informed written consent was obtained from the patient after a discussion of the risks, benefits and alternatives to treatment. Questions regarding the procedure were encouraged and answered. A timeout was performed prior to the initiation of the procedure. The right groin was prepped and draped in the usual sterile fashion, and a sterile drape was applied covering the operative field. Maximum barrier sterile technique with sterile gowns and gloves were used for the procedure. A timeout was performed prior to the initiation of the procedure. Local anesthesia was provided with 1% lidocaine. The right femoral head was marked fluoroscopically. Under ultrasound guidance, the right common femoral artery was accessed with a micropuncture kit after the overlying soft tissues were anesthetized with 1% lidocaine. An ultrasound image was saved for documentation purposes. The micropuncture sheath was exchanged for a 5 FrenPakistancular sheath over a Bentson wire. A closure arteriogram was performed through the side of the sheath confirming access within the right common femoral artery. Over a Bentson wire, a Mickelson catheter was advanced to the level of the thoracic aorta where it was back bled and flushed. The catheter was then utilized to select the superior mesenteric artery and a superior mesenteric arteriogram was performed. Utilizing a Fathom 14 microwire, a regular Renegade microcatheter was advanced into the replaced right hepatic artery and a  selective right hepatic arteriogram was performed. Next, 2.5 mCi of technetium 99 MAA was administered for this location. The microcatheter was removed from the patient and placed in the radiation receptacle. Next, the Mickelson catheter was then advanced cranially and utilized to select the celiac artery and a celiac arteriogram was performed. Utilizing a combination of a Fathom 14 and transcend microwires, a regular renegade micro catheter was utilized to select the left hepatic artery and a left hepatic arteriogram was performed. Next, the microcatheter was utilized select the right gastric artery. A selective right a gastric arteriogram was performed and the right gastric artery was percutaneously coil embolized with multiple overlapping 2 mm diameter interlock coils. The microcatheter was withdrawn into the left hepatic artery and 8 post embolization arteriogram was performed. Microcatheter was advanced into the distal aspect of the left hepatic artery and a selective left hepatic arteriogram was performed. Next, 2.5 mCi of technetium 99 MAA was administered for this location. The microcatheter was removed from the patient and placed in the radiation receptacle. At this point, the procedure was  terminated. All wires, catheters and sheaths were removed from the patient. Hemostasis was achieved at the right groin and access site with manual compression. A dressing was placed. The patient tolerated procedure well without immediate postprocedural complication. The patient was escorted to nuclear medicine department for planar imaging. FINDINGS: Selective superior mesenteric arteriogram and demonstrates a replaced right hepatic artery arising from the proximal trunk of the SMA as demonstrated on preceding CTA. Selective right hepatic arteriogram was negative for presence of extrahepatic vascular contribution. Approximately 2.5 mCi of technetium 99 MAA was administered for this location. Left hepatic arteriogram  confirms contribution to the right gastric artery. Selective percutaneous coil embolization of a right gastric artery to near the vessels origin. Conventional takeoff of the gastroduodenal artery, however the takeoff was remote from the planned administered location of Y 90 and thus NOT embolized. Approximately 2.5 mCi of technetium 99 MAA was administered from the distal aspect of the left hepatic artery. IMPRESSION: 1. Successful pre Y-90 arteriogram with percutaneous coil embolization of the right gastric artery. 2. Non conventional anatomy with a replaced right hepatic artery arising from the proximal SMA. 3. Successful administration of 2.5 mCi of technetium 67mMAA via the replaced right hepatic artery. 4. Successful administration of 2.5 mCi of technetium 930mAA via the left hepatic artery. PLAN: Pending the results of the nuclear medicine liver scan, the patient will return Y 90 radioembolization of the replaced right hepatic artery. Pending the patient's recovery from this initial treatment session, the patient will ultimately return for radioembolization of the left hepatic artery. Electronically Signed   By: JoSandi Mariscal.D.   On: 11/28/2016 13:09   Ir Angiogram Selective Each Additional Vessel  Result Date: 11/28/2016 INDICATION: History of metastatic neuroendocrine tumor. Patient presents today for mapping Y 90 Radioembolization. Please refer to formal consultation in the epic EMR on 11/06/2016 for additional details. EXAM: 1. ULTRASOUND GUIDANCE FOR ARTERIAL ACCESS 2. CELIAC AND SUPERIOR MESENTERIC ARTERIOGRAM (1st ORDER) 3. SELECTIVE RIGHT HEPATIC ARTERIOGRAM (3rd ORDER) AND ADMINISTRATION OF Tc9962mA 4. SELECTIVE RIGHT GASTRIC ARTERIOGRAM (3rd ORDER) AND PERCUTANEOUS COIL EMBOLIZATION. 5. SELECTIVE LEFT HEPATIC ARTERIOGRAM (3rd ORDER) AND ADMINISTRATION OF Tc99m64m COMPARISON:  CTA of the abdomen pelvis - 11/13/2016; MEDICATIONS: None RADIOPHARMACEUTICALS:  2.5 mCi Tc99m 78madministered via  the replaced right hepatic artery; 2.5 mCi Tc99m M4mdministered via the left hepatic artery CONTRAST:  86 cc Isovue 300 ANESTHESIA/SEDATION: Moderate (conscious) sedation was employed during this procedure. A total of Versed 4 mg and Fentanyl 200 mcg was administered intravenously. Moderate Sedation Time: 60 minutes. The patient's level of consciousness and vital signs were monitored continuously by radiology nursing throughout the procedure under my direct supervision. FLUOROSCOPY TIME:  14 minutes 42 seconds (2,185 mGy) ACCESS: Right common femoral artery; hemostasis achieved with manual compression. COMPLICATIONS: None immediate. TECHNIQUE: Informed written consent was obtained from the patient after a discussion of the risks, benefits and alternatives to treatment. Questions regarding the procedure were encouraged and answered. A timeout was performed prior to the initiation of the procedure. The right groin was prepped and draped in the usual sterile fashion, and a sterile drape was applied covering the operative field. Maximum barrier sterile technique with sterile gowns and gloves were used for the procedure. A timeout was performed prior to the initiation of the procedure. Local anesthesia was provided with 1% lidocaine. The right femoral head was marked fluoroscopically. Under ultrasound guidance, the right common femoral artery was accessed with a micropuncture kit after the overlying  soft tissues were anesthetized with 1% lidocaine. An ultrasound image was saved for documentation purposes. The micropuncture sheath was exchanged for a 5 [place] vascular sheath over a Bentson wire. A closure arteriogram was performed through the side of the sheath confirming access within the right common femoral artery. Over a Bentson wire, a Mickelson catheter was advanced to the level of the thoracic aorta where it was back bled and flushed. The catheter was then utilized to select the superior mesenteric artery and a  superior mesenteric arteriogram was performed. Utilizing a Fathom 14 microwire, a regular Renegade microcatheter was advanced into the replaced right hepatic artery and a selective right hepatic arteriogram was performed. Next, 2.5 mCi of technetium 99 MAA was administered for this location. The microcatheter was removed from the patient and placed in the radiation receptacle. Next, the Mickelson catheter was then advanced cranially and utilized to select the celiac artery and a celiac arteriogram was performed. Utilizing a combination of a Fathom 14 and transcend microwires, a regular renegade micro catheter was utilized to select the left hepatic artery and a left hepatic arteriogram was performed. Next, the microcatheter was utilized select the right gastric artery. A selective right a gastric arteriogram was performed and the right gastric artery was percutaneously coil embolized with multiple overlapping 2 mm diameter interlock coils. The microcatheter was withdrawn into the left hepatic artery and 8 post embolization arteriogram was performed. Microcatheter was advanced into the distal aspect of the left hepatic artery and a selective left hepatic arteriogram was performed. Next, 2.5 mCi of technetium 99 MAA was administered for this location. The microcatheter was removed from the patient and placed in the radiation receptacle. At this point, the procedure was terminated. All wires, catheters and sheaths were removed from the patient. Hemostasis was achieved at the right groin and access site with manual compression. A dressing was placed. The patient tolerated procedure well without immediate postprocedural complication. The patient was escorted to nuclear medicine department for planar imaging. FINDINGS: Selective superior mesenteric arteriogram and demonstrates a replaced right hepatic artery arising from the proximal trunk of the SMA as demonstrated on preceding CTA. Selective right hepatic arteriogram  was negative for presence of extrahepatic vascular contribution. Approximately 2.5 mCi of technetium 99 MAA was administered for this location. Left hepatic arteriogram confirms contribution to the right gastric artery. Selective percutaneous coil embolization of a right gastric artery to near the vessels origin. Conventional takeoff of the gastroduodenal artery, however the takeoff was remote from the planned administered location of Y 90 and thus NOT embolized. Approximately 2.5 mCi of technetium 99 MAA was administered from the distal aspect of the left hepatic artery. IMPRESSION: 1. Successful pre Y-90 arteriogram with percutaneous coil embolization of the right gastric artery. 2. Non conventional anatomy with a replaced right hepatic artery arising from the proximal SMA. 3. Successful administration of 2.5 mCi of technetium 20mMAA via the replaced right hepatic artery. 4. Successful administration of 2.5 mCi of technetium 926mAA via the left hepatic artery. PLAN: Pending the results of the nuclear medicine liver scan, the patient will return Y 90 radioembolization of the replaced right hepatic artery. Pending the patient's recovery from this initial treatment session, the patient will ultimately return for radioembolization of the left hepatic artery. Electronically Signed   By: JoSandi Mariscal.D.   On: 11/28/2016 13:09   Ir Angiogram Selective Each Additional Vessel  Result Date: 11/28/2016 INDICATION: History of metastatic neuroendocrine tumor. Patient presents today for mapping Y  90 Radioembolization. Please refer to formal consultation in the epic EMR on 11/06/2016 for additional details. EXAM: 1. ULTRASOUND GUIDANCE FOR ARTERIAL ACCESS 2. CELIAC AND SUPERIOR MESENTERIC ARTERIOGRAM (1st ORDER) 3. SELECTIVE RIGHT HEPATIC ARTERIOGRAM (3rd ORDER) AND ADMINISTRATION OF Tc79mMAA 4. SELECTIVE RIGHT GASTRIC ARTERIOGRAM (3rd ORDER) AND PERCUTANEOUS COIL EMBOLIZATION. 5. SELECTIVE LEFT HEPATIC ARTERIOGRAM (3rd  ORDER) AND ADMINISTRATION OF Tc940mAA COMPARISON:  CTA of the abdomen pelvis - 11/13/2016; MEDICATIONS: None RADIOPHARMACEUTICALS:  2.5 mCi Tc998mA administered via the replaced right hepatic artery; 2.5 mCi Tc99m63m administered via the left hepatic artery CONTRAST:  86 cc Isovue 300 ANESTHESIA/SEDATION: Moderate (conscious) sedation was employed during this procedure. A total of Versed 4 mg and Fentanyl 200 mcg was administered intravenously. Moderate Sedation Time: 60 minutes. The patient's level of consciousness and vital signs were monitored continuously by radiology nursing throughout the procedure under my direct supervision. FLUOROSCOPY TIME:  14 minutes 42 seconds (2,185 mGy) ACCESS: Right common femoral artery; hemostasis achieved with manual compression. COMPLICATIONS: None immediate. TECHNIQUE: Informed written consent was obtained from the patient after a discussion of the risks, benefits and alternatives to treatment. Questions regarding the procedure were encouraged and answered. A timeout was performed prior to the initiation of the procedure. The right groin was prepped and draped in the usual sterile fashion, and a sterile drape was applied covering the operative field. Maximum barrier sterile technique with sterile gowns and gloves were used for the procedure. A timeout was performed prior to the initiation of the procedure. Local anesthesia was provided with 1% lidocaine. The right femoral head was marked fluoroscopically. Under ultrasound guidance, the right common femoral artery was accessed with a micropuncture kit after the overlying soft tissues were anesthetized with 1% lidocaine. An ultrasound image was saved for documentation purposes. The micropuncture sheath was exchanged for a 5 FrenPakistancular sheath over a Bentson wire. A closure arteriogram was performed through the side of the sheath confirming access within the right common femoral artery. Over a Bentson wire, a Mickelson  catheter was advanced to the level of the thoracic aorta where it was back bled and flushed. The catheter was then utilized to select the superior mesenteric artery and a superior mesenteric arteriogram was performed. Utilizing a Fathom 14 microwire, a regular Renegade microcatheter was advanced into the replaced right hepatic artery and a selective right hepatic arteriogram was performed. Next, 2.5 mCi of technetium 99 MAA was administered for this location. The microcatheter was removed from the patient and placed in the radiation receptacle. Next, the Mickelson catheter was then advanced cranially and utilized to select the celiac artery and a celiac arteriogram was performed. Utilizing a combination of a Fathom 14 and transcend microwires, a regular renegade micro catheter was utilized to select the left hepatic artery and a left hepatic arteriogram was performed. Next, the microcatheter was utilized select the right gastric artery. A selective right a gastric arteriogram was performed and the right gastric artery was percutaneously coil embolized with multiple overlapping 2 mm diameter interlock coils. The microcatheter was withdrawn into the left hepatic artery and 8 post embolization arteriogram was performed. Microcatheter was advanced into the distal aspect of the left hepatic artery and a selective left hepatic arteriogram was performed. Next, 2.5 mCi of technetium 99 MAA was administered for this location. The microcatheter was removed from the patient and placed in the radiation receptacle. At this point, the procedure was terminated. All wires, catheters and sheaths were removed from the patient. Hemostasis  was achieved at the right groin and access site with manual compression. A dressing was placed. The patient tolerated procedure well without immediate postprocedural complication. The patient was escorted to nuclear medicine department for planar imaging. FINDINGS: Selective superior mesenteric  arteriogram and demonstrates a replaced right hepatic artery arising from the proximal trunk of the SMA as demonstrated on preceding CTA. Selective right hepatic arteriogram was negative for presence of extrahepatic vascular contribution. Approximately 2.5 mCi of technetium 99 MAA was administered for this location. Left hepatic arteriogram confirms contribution to the right gastric artery. Selective percutaneous coil embolization of a right gastric artery to near the vessels origin. Conventional takeoff of the gastroduodenal artery, however the takeoff was remote from the planned administered location of Y 90 and thus NOT embolized. Approximately 2.5 mCi of technetium 99 MAA was administered from the distal aspect of the left hepatic artery. IMPRESSION: 1. Successful pre Y-90 arteriogram with percutaneous coil embolization of the right gastric artery. 2. Non conventional anatomy with a replaced right hepatic artery arising from the proximal SMA. 3. Successful administration of 2.5 mCi of technetium 44mMAA via the replaced right hepatic artery. 4. Successful administration of 2.5 mCi of technetium 984mAA via the left hepatic artery. PLAN: Pending the results of the nuclear medicine liver scan, the patient will return Y 90 radioembolization of the replaced right hepatic artery. Pending the patient's recovery from this initial treatment session, the patient will ultimately return for radioembolization of the left hepatic artery. Electronically Signed   By: JoSandi Mariscal.D.   On: 11/28/2016 13:09   Nm Special Med Rad Physics Cons  Result Date: 12/13/2016 CLINICAL DATA:  Metastatic malignant neuroendocrine tumor to the liver. EXAM: NUCLEAR MEDICINE SPECIAL MED RAD PHYSICS CONS; NUCLEAR MEDICINE RADIO PHARM THERAPY INTRA ARTERIAL; NUCLEAR MEDICINE TREATMENT PROCEDURE; NUCLEAR MEDICINE LIVER SCAN TECHNIQUE: In conjunction with the interventional radiologist a Y- Microsphere dose was calculated utilizing body surface  area formulation. Calculated dose equal 36.99 mCi. Pre therapy MAA liver SPECT scan and CTA were evaluated. Utilizing a microcatheter system, the hepatic artery was selected and Y-90 microspheres were delivered in fractionated aliquots. Radiopharmaceutical was delivered by the interventional radiologist and nuclear radiologist. MiEpimenio Foothrough the infusion patient reported back pain and the patient's blood pressure was noted to be increased and the procedure was then terminated. The patient tolerated procedure well. No adverse effects were noted. Bremsstrahlung planar and SPECT imaging of the abdomen following intrahepatic arterial delivery of Y-90 microsphere was performed. RADIOPHARMACEUTICALS:  A total of 1871.21illicuries of Y- 90 microsphere administered. COMPARISON:  10/02/2016 FINDINGS: Y - 90 microspheres therapy as above. First therapy the right hepatic lobe. Bremsstrahlung planar and SPECT imaging of the abdomen following intrahepatic arterial delivery of [id number] demonstrates radioactivity localized to the right hepatic lobe. No evidence of extrahepatic activity. IMPRESSION: Successful Y - 90 microsphere delivery for treatment of unresectable liver metastasis. First therapy to the right lobe. Bremssstrahlung scan demonstrates activity localized to right hepatic lobe with no extrahepatic activity identified. Electronically Signed   By: TayKerby MoorsD.   On: 12/13/2016 13:39   Nm Special Treatment Procedure  Result Date: 12/13/2016 CLINICAL DATA:  Metastatic malignant neuroendocrine tumor to the liver. EXAM: NUCLEAR MEDICINE SPECIAL MED RAD PHYSICS CONS; NUCLEAR MEDICINE RADIO PHARM THERAPY INTRA ARTERIAL; NUCLEAR MEDICINE TREATMENT PROCEDURE; NUCLEAR MEDICINE LIVER SCAN TECHNIQUE: In conjunction with the interventional radiologist a Y- Microsphere dose was calculated utilizing body surface area formulation. Calculated dose equal 36.99 mCi. Pre therapy MAA liver  SPECT scan and CTA were  evaluated. Utilizing a microcatheter system, the hepatic artery was selected and Y-90 microspheres were delivered in fractionated aliquots. Radiopharmaceutical was delivered by the interventional radiologist and nuclear radiologist. [name] through the infusion patient reported back pain and the patient's blood pressure was noted to be increased and the procedure was then terminated. The patient tolerated procedure well. No adverse effects were noted. Bremsstrahlung planar and SPECT imaging of the abdomen following intrahepatic arterial delivery of Y-90 microsphere was performed. RADIOPHARMACEUTICALS:  A total of 38.88 millicuries of Y- 90 microsphere administered. COMPARISON:  10/02/2016 FINDINGS: Y - 90 microspheres therapy as above. First therapy the right hepatic lobe. Bremsstrahlung planar and SPECT imaging of the abdomen following intrahepatic arterial delivery of Y-78mcrosphere demonstrates radioactivity localized to the right hepatic lobe. No evidence of extrahepatic activity. IMPRESSION: Successful Y - 90 microsphere delivery for treatment of unresectable liver metastasis. First therapy to the right lobe. Bremssstrahlung scan demonstrates activity localized to right hepatic lobe with no extrahepatic activity identified. Electronically Signed   By: TKerby MoorsM.D.   On: 12/13/2016 13:39   Ir [place] Vasc Access Right  Result Date: 12/13/2016 INDICATION: History of metastatic neuroendocrine tumor. Patient returns today for left hepatic arteriogram and potential percutaneous coil embolization of a suspected falciform artery as well as replaced right hepatic arteriogram and radioembolization of the right lobe of the liver. EXAM: 1. CELIAC ARTERIOGRAM 2. SELECTIVE LEFT HEPATIC ARTERIOGRAM WITH THE ACQUISITION OF DYNA CT IMAGES 3. SUPERIOR MESENTERIC ARTERIOGRAM 4. SELECTIVE REPLACED RIGHT HEPATIC ARTERIOGRAM 5. TRANSCATHETER YTTRIUM-90 RADIOEMBOLIZATION OF THE REPLACED RIGHT HEPATIC ARTERY 6.  FLUOROSCOPIC GUIDED ADMINISTRATION OF SIR-SPHERES 7. ULTRASOUND GUIDED VASCULAR ACCESS OF THE RIGHT COMMON FEMORAL ARTERY COMPARISON:  Mapping pre Y 90 radioembolization - 12/13/2016; CT of the abdomen pelvis - 11/13/2016 MEDICATIONS: Protonix 40 mg IV; Decadron 4 mg IV; Toradol 30 mg IV; Sandostatin 200 mg IV CONTRAST:  125 cc Isovue-300 ANESTHESIA/SEDATION: Moderate (conscious) sedation was employed during this procedure. A total of Versed 2 mg and Fentanyl 100 mcg was administered intravenously. Moderate Sedation Time: 143 minutes. The patient's level of consciousness and vital signs were monitored continuously by radiology nursing throughout the procedure under my direct supervision. FLUOROSCOPY TIME:  12 minutes 12 seconds (3,316 mGy). ACCESS: Right common femoral artery; hemostasis achieved with manual compression. COMPLICATIONS: SIR LEVEL B - Normal therapy, includes overnight admission for observation. Patient develop chest and midline back pain during the radioembolization of the right lobe of the liver and the complete dose of SIR spheres was unable to be administered. Note, postprocedural ECG was negative for evidence of ischemia and post procedural labs were negative for a troponin elevation. TECHNIQUE: Informed written consent was obtained from the patient after a discussion of the risks, benefits and alternatives to treatment. Questions regarding the procedure were encouraged and answered. A timeout was performed prior to the initiation of the procedure. The right groin was prepped and draped in the usual sterile fashion, and a sterile drape was applied covering the operative field. Maximum barrier sterile technique with sterile gowns and gloves were used for the procedure. A timeout was performed prior to the initiation of the procedure. Local anesthesia was provided with 1% lidocaine. The right femoral head was marked fluoroscopically. Under direct ultrasound guidance, the right common femoral artery  was accessed with a micropuncture kit allowing placement of a of 5-French vascular sheath. An ultrasound image was saved for documentation purposes. A limited arteriogram performed through the side arm of the  sheath confirming appropriate access within the right common femoral artery. Over a Britta Mccreedy wire, a Mickelson catheter was advanced to the level of the inferior thoracic aorta where it was reformed, back bled and flushed. The Mickelson catheter was utilized to select the celiac artery and a celiac arteriogram was performed. With the use of a Fathom 14 micro wire, a regular Renegade microcatheter was advanced into the left hepatic artery and multiple selective left hepatic arteriograms were performed in various obliquities including the acquisition of Dyna CT images during selective left hepatic arteriogram. Attention was now paid towards radioembolization of the replaced right hepatic artery. The Ann Held was then utilized select the superior mesenteric artery and a selective superior mesenteric arteriogram was performed. With the use of a Fathom 14 micro wire, a high-flow Renegade microcatheter was advanced into the distal aspect of the replaced right hepatic artery at the level of the vessel's bifurcation. Contrast injection confirmed appropriate positioning. Radioembolization was then performed with Yttrium-90 SIR Spheres. Particles were administered via a microcatheter utilizing a completely enclosed system. Monitoring of antegrade flow was performed during and after the administration under fluoroscopy with use of contrast intermittently. After the initial dose of the radioembolization, the patient complained of the sudden onset of chest and midline back pain. This is associated with elevation of the patient's blood pressure. Contrast injection continue to demonstrate preserved antegrade flow, however the patient's symptoms persisted. As the patient's symptoms persisted despite temporary halting of the  procedure, the decision was made to abandon the procedure prior to the full administration of the radioembolization. Next, the microcatheter, outer catheter and administration system were then discarded into radiation safety receptacle. At this point, the procedure was terminated. The right common femoral approach vascular sheath was removed and hemostasis was achieved with manual compression. A dressing was placed. Postprocedural EKG was negative for evidence of ischemia in subsequent troponin levels were negative. All participants involved with the procedure were surveyed by the radiation safety officer prior to exiting the fluoroscopy suite. The patient was escorted to the nuclear medicine department for post-treatment imaging. 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 Multiple selective left hepatic arteriograms appear to demonstrate a potential falciform artery which could serve as the source of the non target MAA demonstrated on preceding nuclear medicine planning study. Dyna CT images failed to delineate the origin of this vessel however images are degraded secondary to patient body habitus. As the atretic vessel was attempted to be cannulated, the vessel apparently spasm and was no longer visualized. As such, percutaneous coil embolization of the presumed falciform artery was not performed. 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 Again, there are no definitive abnormal vessels arising from the replaced SMA via the intestines. Successful administration of a partial dose of SIR spheres from the distal aspect of the right SMA the level the vessel's bifurcation. Intermittent contrast injections during and after the radioembolization confirming preserved antegrade flow without reflux. As above, the complete calculated dose  of SIR spheres was unable to be administered secondary to patient's development of unexplained chest and back pain shortly after the beginning of the radioembolization. IMPRESSION: Technically successful radioembolization of the replaced right hepatic artery with Yttrium-90 microspheres. PLAN: - Patient will be seen in the interventional radiology clinic in 3-4 weeks with the acquisition of LFTs. - As the patient tolerated today's procedure poorly, I am uncertain whether we will  be able to proceed with completion radioembolization of the left lobe of the liver. Electronically Signed   By: Sandi Mariscal M.D.   On: 12/13/2016 17:27   Ir US Guide Vasc Access Right  Result Date: 11/28/2016 INDICATION: History of metastatic neuroendocrine tumor. Patient presents today for mapping Y 90 Radioembolization. Please refer to formal consultation in the epic EMR on 11/06/2016 for additional details. EXAM: 1. ULTRASOUND GUIDANCE FOR ARTERIAL ACCESS 2. CELIAC AND SUPERIOR MESENTERIC ARTERIOGRAM (1st ORDER) 3. SELECTIVE RIGHT HEPATIC ARTERIOGRAM (3rd ORDER) AND ADMINISTRATION OF Tc32mMAA 4. SELECTIVE RIGHT GASTRIC ARTERIOGRAM (3rd ORDER) AND PERCUTANEOUS COIL EMBOLIZATION. 5. SELECTIVE LEFT HEPATIC ARTERIOGRAM (3rd ORDER) AND ADMINISTRATION OF Tc968mAA COMPARISON:  CTA of the abdomen pelvis - 11/13/2016; MEDICATIONS: None RADIOPHARMACEUTICALS:  2.5 mCi Tc9933mA administered via the replaced right hepatic artery; 2.5 mCi Tc99m35m administered via the left hepatic artery CONTRAST:  86 cc Isovue 300 ANESTHESIA/SEDATION: Moderate (conscious) sedation was employed during this procedure. A total of Versed 4 mg and Fentanyl 200 mcg was administered intravenously. Moderate Sedation Time: 60 minutes. The patient's level of consciousness and vital signs were monitored continuously by radiology nursing throughout the procedure under my direct supervision. FLUOROSCOPY TIME:  14 minutes 42 seconds (2,185 mGy) ACCESS: Right common femoral  artery; hemostasis achieved with manual compression. COMPLICATIONS: None immediate. TECHNIQUE: Informed written consent was obtained from the patient after a discussion of the risks, benefits and alternatives to treatment. Questions regarding the procedure were encouraged and answered. A timeout was performed prior to the initiation of the procedure. The right groin was prepped and draped in the usual sterile fashion, and a sterile drape was applied covering the operative field. Maximum barrier sterile technique with sterile gowns and gloves were used for the procedure. A timeout was performed prior to the initiation of the procedure. Local anesthesia was provided with 1% lidocaine. The right femoral head was marked fluoroscopically. Under ultrasound guidance, the right common femoral artery was accessed with a micropuncture kit after the overlying soft tissues were anesthetized with 1% lidocaine. An ultrasound image was saved for documentation purposes. The micropuncture sheath was exchanged for a 5 FrenPakistancular sheath over a Bentson wire. A closure arteriogram was performed through the side of the sheath confirming access within the right common femoral artery. Over a Bentson wire, a Mickelson catheter was advanced to the level of the thoracic aorta where it was back bled and flushed. The catheter was then utilized to select the superior mesenteric artery and a superior mesenteric arteriogram was performed. Utilizing a Fathom 14 microwire, a regular Renegade microcatheter was advanced into the replaced right hepatic artery and a selective right hepatic arteriogram was performed. Next, 2.5 mCi of technetium 99 MAA was administered for this location. The microcatheter was removed from the patient and placed in the radiation receptacle. Next, the Mickelson catheter was then advanced cranially and utilized to select the celiac artery and a celiac arteriogram was performed. Utilizing a combination of a Fathom 14 and  transcend microwires, a regular renegade micro catheter was utilized to select the left hepatic artery and a left hepatic arteriogram was performed. Next, the microcatheter was utilized select the right gastric artery. A selective right a gastric arteriogram was performed and the right gastric artery was percutaneously coil embolized with multiple overlapping 2 mm diameter interlock coils. The microcatheter was withdrawn into the left hepatic artery and 8 post embolization arteriogram was performed. Microcatheter was advanced into the distal aspect of the left hepatic  artery and a selective left hepatic arteriogram was performed. Next, 2.5 mCi of technetium 99 MAA was administered for this location. The microcatheter was removed from the patient and placed in the radiation receptacle. At this point, the procedure was terminated. All wires, catheters and sheaths were removed from the patient. Hemostasis was achieved at the right groin and access site with manual compression. A dressing was placed. The patient tolerated procedure well without immediate postprocedural complication. The patient was escorted to nuclear medicine department for planar imaging. FINDINGS: Selective superior mesenteric arteriogram and demonstrates a replaced right hepatic artery arising from the proximal trunk of the SMA as demonstrated on preceding CTA. Selective right hepatic arteriogram was negative for presence of extrahepatic vascular contribution. Approximately 2.5 mCi of technetium 99 MAA was administered for this location. Left hepatic arteriogram confirms contribution to the right gastric artery. Selective percutaneous coil embolization of a right gastric artery to near the vessels origin. Conventional takeoff of the gastroduodenal artery, however the takeoff was remote from the planned administered location of Y 90 and thus NOT embolized. Approximately 2.5 mCi of technetium 99 MAA was administered from the distal aspect of the left  hepatic artery. IMPRESSION: 1. Successful pre Y-90 arteriogram with percutaneous coil embolization of the right gastric artery. 2. Non conventional anatomy with a replaced right hepatic artery arising from the proximal SMA. 3. Successful administration of 2.5 mCi of technetium 79mMAA via the replaced right hepatic artery. 4. Successful administration of 2.5 mCi of technetium 955mAA via the left hepatic artery. PLAN: Pending the results of the nuclear medicine liver scan, the patient will return Y 90 radioembolization of the replaced right hepatic artery. Pending the patient's recovery from this initial treatment session, the patient will ultimately return for radioembolization of the left hepatic artery. Electronically Signed   By: JoSandi Mariscal.D.   On: 11/28/2016 13:09   Dg Chest Port 1 View  Result Date: 12/13/2016 CLINICAL DATA:  Metastatic neuroendocrine cancer EXAM: PORTABLE CHEST 1 VIEW COMPARISON:  None. FINDINGS: Loop recorder device projects over the left chest. Heart is borderline in size. Mild vascular congestion. No confluent opacities or effusions. No acute bony abnormality. IMPRESSION: Borderline heart size.  Mild vascular congestion. Electronically Signed   By: KeRolm Baptise.D.   On: 12/13/2016 14:29   Ir Embo Arterial Not [place]  Result Date: 11/28/2016 INDICATION: History of metastatic neuroendocrine tumor. Patient presents today for mapping Y 90 Radioembolization. Please refer to formal consultation in the epic EMR on 11/06/2016 for additional details. EXAM: 1. ULTRASOUND GUIDANCE FOR ARTERIAL ACCESS 2. CELIAC AND SUPERIOR MESENTERIC ARTERIOGRAM (1st ORDER) 3. SELECTIVE RIGHT HEPATIC ARTERIOGRAM (3rd ORDER) AND ADMINISTRATION OF Tc99105mA 4. SELECTIVE RIGHT GASTRIC ARTERIOGRAM (3rd ORDER) AND PERCUTANEOUS COIL EMBOLIZATION. 5. SELECTIVE LEFT HEPATIC ARTERIOGRAM (3rd ORDER) AND ADMINISTRATION OF Tc99m18m COMPARISON:  CTA of the abdomen pelvis - 11/13/2016;  MEDICATIONS: None RADIOPHARMACEUTICALS:  2.5 mCi Tc99m 61madministered via the replaced right hepatic artery; 2.5 mCi Tc99m M25mdministered via the left hepatic artery CONTRAST:  86 cc Isovue 300 ANESTHESIA/SEDATION: Moderate (conscious) sedation was employed during this procedure. A total of Versed 4 mg and Fentanyl 200 mcg was administered intravenously. Moderate Sedation Time: 60 minutes. The patient's level of consciousness and vital signs were monitored continuously by radiology nursing throughout the procedure under my direct supervision. FLUOROSCOPY TIME:  14 minutes 42 seconds (2,185 mGy) ACCESS: Right common femoral artery; hemostasis achieved with manual compression. COMPLICATIONS: None immediate. TECHNIQUE:  Informed written consent was obtained from the patient after a discussion of the risks, benefits and alternatives to treatment. Questions regarding the procedure were encouraged and answered. A timeout was performed prior to the initiation of the procedure. The right groin was prepped and draped in the usual sterile fashion, and a sterile drape was applied covering the operative field. Maximum barrier sterile technique with sterile gowns and gloves were used for the procedure. A timeout was performed prior to the initiation of the procedure. Local anesthesia was provided with 1% lidocaine. The right femoral head was marked fluoroscopically. Under ultrasound guidance, the right common femoral artery was accessed with a micropuncture kit after the overlying soft tissues were anesthetized with 1% lidocaine. An ultrasound image was saved for documentation purposes. The micropuncture sheath was exchanged for a 5 [place] vascular sheath over a Bentson wire. A closure arteriogram was performed through the side of the sheath confirming access within the right common femoral artery. Over a Bentson wire, a Mickelson catheter was advanced to the level of the thoracic aorta where it was back bled and flushed. The  catheter was then utilized to select the superior mesenteric artery and a superior mesenteric arteriogram was performed. Utilizing a Fathom 14 microwire, a regular Renegade microcatheter was advanced into the replaced right hepatic artery and a selective right hepatic arteriogram was performed. Next, 2.5 mCi of technetium 99 MAA was administered for this location. The microcatheter was removed from the patient and placed in the radiation receptacle. Next, the Mickelson catheter was then advanced cranially and utilized to select the celiac artery and a celiac arteriogram was performed. Utilizing a combination of a Fathom 14 and transcend microwires, a regular renegade micro catheter was utilized to select the left hepatic artery and a left hepatic arteriogram was performed. Next, the microcatheter was utilized select the right gastric artery. A selective right a gastric arteriogram was performed and the right gastric artery was percutaneously coil embolized with multiple overlapping 2 mm diameter interlock coils. The microcatheter was withdrawn into the left hepatic artery and 8 post embolization arteriogram was performed. Microcatheter was advanced into the distal aspect of the left hepatic artery and a selective left hepatic arteriogram was performed. Next, 2.5 mCi of technetium 99 MAA was administered for this location. The microcatheter was removed from the patient and placed in the radiation receptacle. At this point, the procedure was terminated. All wires, catheters and sheaths were removed from the patient. Hemostasis was achieved at the right groin and access site with manual compression. A dressing was placed. The patient tolerated procedure well without immediate postprocedural complication. The patient was escorted to nuclear medicine department for planar imaging. FINDINGS: Selective superior mesenteric arteriogram and demonstrates a replaced right hepatic artery arising from the proximal trunk of the  SMA as demonstrated on preceding CTA. Selective right hepatic arteriogram was negative for presence of extrahepatic vascular contribution. Approximately 2.5 mCi of technetium 99 MAA was administered for this location. Left hepatic arteriogram confirms contribution to the right gastric artery. Selective percutaneous coil embolization of a right gastric artery to near the vessels origin. Conventional takeoff of the gastroduodenal artery, however the takeoff was remote from the planned administered location of Y 90 and thus NOT embolized. Approximately 2.5 mCi of technetium 99 MAA was administered from the distal aspect of the left hepatic artery. IMPRESSION: 1. Successful pre Y-90 arteriogram with percutaneous coil embolization of the right gastric artery. 2. Non conventional anatomy with a replaced right hepatic artery arising from  the proximal SMA. 3. Successful administration of 2.5 mCi of technetium 50mMAA via the replaced right hepatic artery. 4. Successful administration of 2.5 mCi of technetium 99mAA via the left hepatic artery. PLAN: Pending the results of the nuclear medicine liver scan, the patient will return Y 90 radioembolization of the replaced right hepatic artery. Pending the patient's recovery from this initial treatment session, the patient will ultimately return for radioembolization of the left hepatic artery. Electronically Signed   By: JoSandi Mariscal.D.   On: 11/28/2016 13:09   Ir Embo Tumor Organ Ischemia Infarct Inc Guide Roadmapping  Result Date: 12/13/2016 INDICATION: History of metastatic neuroendocrine tumor. Patient returns today for left hepatic arteriogram and potential percutaneous coil embolization of a suspected falciform artery as well as replaced right hepatic arteriogram and radioembolization of the right lobe of the liver. EXAM: 1. CELIAC ARTERIOGRAM 2. SELECTIVE LEFT HEPATIC ARTERIOGRAM WITH THE ACQUISITION OF DYNA CT IMAGES 3. SUPERIOR MESENTERIC ARTERIOGRAM 4. SELECTIVE  REPLACED RIGHT HEPATIC ARTERIOGRAM 5. TRANSCATHETER YTTRIUM-90 RADIOEMBOLIZATION OF THE REPLACED RIGHT HEPATIC ARTERY 6. FLUOROSCOPIC GUIDED ADMINISTRATION OF SIR-SPHERES 7. ULTRASOUND GUIDED VASCULAR ACCESS OF THE RIGHT COMMON FEMORAL ARTERY COMPARISON:  Mapping pre Y 90 radioembolization - 12/13/2016; CT of the abdomen pelvis - 11/13/2016 MEDICATIONS: Protonix 40 mg IV; Decadron 4 mg IV; Toradol 30 mg IV; Sandostatin 200 mg IV CONTRAST:  125 cc Isovue-300 ANESTHESIA/SEDATION: Moderate (conscious) sedation was employed during this procedure. A total of Versed 2 mg and Fentanyl 100 mcg was administered intravenously. Moderate Sedation Time: 143 minutes. The patient's level of consciousness and vital signs were monitored continuously by radiology nursing throughout the procedure under my direct supervision. FLUOROSCOPY TIME:  12 minutes 12 seconds (3,316 mGy). ACCESS: Right common femoral artery; hemostasis achieved with manual compression. COMPLICATIONS: SIR LEVEL B - Normal therapy, includes overnight admission for observation. Patient develop chest and midline back pain during the radioembolization of the right lobe of the liver and the complete dose of SIR spheres was unable to be administered. Note, postprocedural ECG was negative for evidence of ischemia and post procedural labs were negative for a troponin elevation. TECHNIQUE: Informed written consent was obtained from the patient after a discussion of the risks, benefits and alternatives to treatment. Questions regarding the procedure were encouraged and answered. A timeout was performed prior to the initiation of the procedure. The right groin was prepped and draped in the usual sterile fashion, and a sterile drape was applied covering the operative field. Maximum barrier sterile technique with sterile gowns and gloves were used for the procedure. A timeout was performed prior to the initiation of the procedure. Local anesthesia was provided with 1%  lidocaine. The right femoral head was marked fluoroscopically. Under direct ultrasound guidance, the right common femoral artery was accessed with a micropuncture kit allowing placement of a of 5-French vascular sheath. An ultrasound image was saved for documentation purposes. A limited arteriogram performed through the side arm of the sheath confirming appropriate access within the right common femoral artery. Over a BeBritta Mccreedyire, a Mickelson catheter was advanced to the level of the inferior thoracic aorta where it was reformed, back bled and flushed. The Mickelson catheter was utilized to select the celiac artery and a celiac arteriogram was performed. With the use of a Fathom 14 micro wire, a regular Renegade microcatheter was advanced into the left hepatic artery and multiple selective left hepatic arteriograms were performed in various obliquities including the acquisition of Dyna CT images during selective left hepatic  arteriogram. Attention was now paid towards radioembolization of the replaced right hepatic artery. The Ann Held was then utilized select the superior mesenteric artery and a selective superior mesenteric arteriogram was performed. With the use of a Fathom 14 micro wire, a high-flow Renegade microcatheter was advanced into the distal aspect of the replaced right hepatic artery at the level of the vessel's bifurcation. Contrast injection confirmed appropriate positioning. Radioembolization was then performed with Yttrium-90 SIR Spheres. Particles were administered via a microcatheter utilizing a completely enclosed system. Monitoring of antegrade flow was performed during and after the administration under fluoroscopy with use of contrast intermittently. After the initial dose of the radioembolization, the patient complained of the sudden onset of chest and midline back pain. This is associated with elevation of the patient's blood pressure. Contrast injection continue to demonstrate preserved  antegrade flow, however the patient's symptoms persisted. As the patient's symptoms persisted despite temporary halting of the procedure, the decision was made to abandon the procedure prior to the full administration of the radioembolization. Next, the microcatheter, outer catheter and administration system were then discarded into radiation safety receptacle. At this point, the procedure was terminated. The right common femoral approach vascular sheath was removed and hemostasis was achieved with manual compression. A dressing was placed. Postprocedural EKG was negative for evidence of ischemia in subsequent troponin levels were negative. All participants involved with the procedure were surveyed by the radiation safety officer prior to exiting the fluoroscopy suite. The patient was escorted to the nuclear medicine department for post-treatment imaging. 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 Multiple selective left hepatic arteriograms appear to demonstrate a potential falciform artery which could serve as the source of the non target MAA demonstrated on preceding nuclear medicine planning study. Dyna CT images failed to delineate the origin of this vessel however images are degraded secondary to patient body habitus. As the atretic vessel was attempted to be cannulated, the vessel apparently spasm and was no longer visualized. As such, percutaneous coil embolization of the presumed falciform artery was not performed. 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 Again, there are no definitive abnormal vessels arising from the replaced SMA via the intestines. Successful administration of a partial dose of SIR spheres from the distal aspect of the right SMA the level the vessel's bifurcation. Intermittent contrast injections  during and after the radioembolization confirming preserved antegrade flow without reflux. As above, the complete calculated dose of SIR spheres was unable to be administered secondary to patient's development of unexplained chest and back pain shortly after the beginning of the radioembolization. IMPRESSION: Technically successful radioembolization of the replaced right hepatic artery with Yttrium-90 microspheres. PLAN: - Patient will be seen in the interventional radiology clinic in 3-4 weeks with the acquisition of LFTs. - As the patient tolerated today's procedure poorly, I am uncertain whether we will be able to proceed with completion radioembolization of the left lobe of the liver. Electronically Signed   By: Sandi Mariscal M.D.   On: 12/13/2016 17:27   Nm Radio Pharm Therapy Intraarterial  Result Date: 12/13/2016 CLINICAL DATA:  Metastatic malignant neuroendocrine tumor to the liver. EXAM: NUCLEAR MEDICINE SPECIAL MED RAD PHYSICS CONS; NUCLEAR MEDICINE RADIO PHARM THERAPY INTRA ARTERIAL; NUCLEAR MEDICINE TREATMENT PROCEDURE; NUCLEAR MEDICINE LIVER SCAN TECHNIQUE: In conjunction with the interventional radiologist a Y- Microsphere dose was calculated utilizing body surface area formulation. Calculated dose equal  36.99 mCi. Pre therapy MAA liver SPECT scan and CTA were evaluated. Utilizing a microcatheter system, the hepatic artery was selected and Y-90 microspheres were delivered in fractionated aliquots. Radiopharmaceutical was delivered by the interventional radiologist and nuclear radiologist. [name] through the infusion patient reported back pain and the patient's blood pressure was noted to be increased and the procedure was then terminated. The patient tolerated procedure well. No adverse effects were noted. Bremsstrahlung planar and SPECT imaging of the abdomen following intrahepatic arterial delivery of Y-90 microsphere was performed. RADIOPHARMACEUTICALS:  A total of 27.03 millicuries of Y- 90  microsphere administered. COMPARISON:  10/02/2016 FINDINGS: Y - 90 microspheres therapy as above. First therapy the right hepatic lobe. Bremsstrahlung planar and SPECT imaging of the abdomen following intrahepatic arterial delivery of Y-55mcrosphere demonstrates radioactivity localized to the right hepatic lobe. No evidence of extrahepatic activity. IMPRESSION: Successful Y - 90 microsphere delivery for treatment of unresectable liver metastasis. First therapy to the right lobe. Bremssstrahlung scan demonstrates activity localized to right hepatic lobe with no extrahepatic activity identified. Electronically Signed   By: TKerby MoorsM.D.   On: 12/13/2016 13:39   Nm Fusion  Result Date: 11/28/2016 CLINICAL DATA:  Metastatic neuroendocrine tumor to the liver. EXAM: ULTRASOUND MISCELLANEOUS SOFT TISSUE TECHNIQUE: Abdominal images were obtained in multiple projections after intrahepatic arterial injection of radiopharmaceutical. SPECT imaging was performed. Lung shunt calculation was performed. RADIOPHARMACEUTICALS:  4.8 MCI TC-50M MAA COMPARISON:  11/13/2016 FINDINGS: The injected microaggregated albumin localizes within the liver. There is a area of non target activity identified ventral and caudal to the inferior margin of the liver. Findings may reflect reflux of radiotracer activity into falciform artery. Calculated shunt fraction to the lungs equals 7.8%. IMPRESSION: 1. There is a focal area of non target activity just ventral and caudal to the inferior margin of the liver which may reflect reflux radiotracer activity away from the liver. Findings were discussed with Dr. WPascal Luxat the time of interpretation. 2. Lung shunt fraction equals 7.8% Electronically Signed   By: TKerby MoorsM.D.   On: 11/28/2016 13:36    Microbiology: No results found for this or any previous visit (from the past 240 hour(s)).   Labs: Basic Metabolic Panel:  Recent Labs Lab 12/13/16 0801 12/14/16 0420  NA 136 131*  K  4.3 4.6  CL 101 98*  CO2 27 24  GLUCOSE 100* 149*  BUN 15 15  CREATININE 0.95 0.95  CALCIUM 9.9 9.4   Liver Function Tests:  Recent Labs Lab 12/13/16 0801  AST 26  ALT 15  ALKPHOS 72  BILITOT 0.8  PROT 7.6  ALBUMIN 4.1   No results for input(s): LIPASE, AMYLASE in the last 168 hours. No results for input(s): AMMONIA in the last 168 hours. CBC:  Recent Labs Lab 12/13/16 0801 12/14/16 0420  WBC 4.1 10.9*  NEUTROABS 1.7  --   HGB 13.2 13.6  HCT 40.8 40.3  MCV 94.4 92.9  PLT 175 169   Cardiac Enzymes:  Recent Labs Lab 12/13/16 1247 12/13/16 1705 12/13/16 2338 12/14/16 0420  TROPONINI <0.03 <0.03 0.04* 0.03*   BNP: BNP (last 3 results) No results for input(s): BNP in the last 8760 hours.  ProBNP (last 3 results) No results for input(s): PROBNP in the last 8760 hours.  CBG: No results for input(s): GLUCAP in the last 168 hours.     Signed:Florencia ReasonsMD, PhD  [facility] 12/14/2016, 12:09 PM

## 2016-12-14 NOTE — Progress Notes (Signed)
Patient continued with head, chest, and back pain once arrived to unit and through most of the early shift. Pain was unrelieved with prn Tylenol and Percocet. MD on call was notified and a order for one time dose of IV Dilaudid was given. Pain improved with Dilaudid and patient was able to get some rest overnight.  Will continue to monitor patient.

## 2016-12-14 NOTE — Progress Notes (Signed)
Reviewed discharge information with patient and caregiver. Answered all questions. Patient/caregiver able to teach back medications and reasons to contact MD/911. Patient verbalizes importance of PCP and cardiology follow up appointment.  Barbee Shropshire. Brigitte Pulse, RN

## 2016-12-14 NOTE — Progress Notes (Signed)
Discharge planning, spoke with patient and spouse at beside. Chose [facility] for [facility] services, contacted [facility] for referral. Nurse to visit for monitoring BP and Meds. [phone]

## 2016-12-18 ENCOUNTER — Other Ambulatory Visit: Payer: Self-pay | Admitting: *Deleted

## 2016-12-18 DIAGNOSIS — I10 Essential (primary) hypertension: Secondary | ICD-10-CM | POA: Diagnosis not present

## 2016-12-18 DIAGNOSIS — C787 Secondary malignant neoplasm of liver and intrahepatic bile duct: Secondary | ICD-10-CM

## 2016-12-18 DIAGNOSIS — Z5111 Encounter for antineoplastic chemotherapy: Secondary | ICD-10-CM

## 2016-12-18 DIAGNOSIS — I480 Paroxysmal atrial fibrillation: Secondary | ICD-10-CM | POA: Diagnosis not present

## 2016-12-18 DIAGNOSIS — C801 Malignant (primary) neoplasm, unspecified: Secondary | ICD-10-CM | POA: Diagnosis not present

## 2016-12-18 DIAGNOSIS — E669 Obesity, unspecified: Secondary | ICD-10-CM | POA: Diagnosis not present

## 2016-12-18 DIAGNOSIS — G319 Degenerative disease of nervous system, unspecified: Secondary | ICD-10-CM | POA: Diagnosis not present

## 2016-12-18 DIAGNOSIS — F419 Anxiety disorder, unspecified: Secondary | ICD-10-CM | POA: Diagnosis not present

## 2016-12-18 DIAGNOSIS — C7A8 Other malignant neuroendocrine tumors: Secondary | ICD-10-CM

## 2016-12-18 DIAGNOSIS — F329 Major depressive disorder, single episode, unspecified: Secondary | ICD-10-CM | POA: Diagnosis not present

## 2016-12-18 DIAGNOSIS — C7B8 Other secondary neuroendocrine tumors: Secondary | ICD-10-CM

## 2016-12-20 DIAGNOSIS — C801 Malignant (primary) neoplasm, unspecified: Secondary | ICD-10-CM | POA: Diagnosis not present

## 2016-12-20 DIAGNOSIS — G319 Degenerative disease of nervous system, unspecified: Secondary | ICD-10-CM | POA: Diagnosis not present

## 2016-12-20 DIAGNOSIS — I10 Essential (primary) hypertension: Secondary | ICD-10-CM | POA: Diagnosis not present

## 2016-12-20 DIAGNOSIS — C787 Secondary malignant neoplasm of liver and intrahepatic bile duct: Secondary | ICD-10-CM | POA: Diagnosis not present

## 2016-12-20 DIAGNOSIS — I480 Paroxysmal atrial fibrillation: Secondary | ICD-10-CM | POA: Diagnosis not present

## 2016-12-20 DIAGNOSIS — E669 Obesity, unspecified: Secondary | ICD-10-CM | POA: Diagnosis not present

## 2016-12-21 ENCOUNTER — Telehealth: Payer: Self-pay | Admitting: Pediatrics

## 2016-12-21 ENCOUNTER — Other Ambulatory Visit: Payer: Self-pay | Admitting: Pediatrics

## 2016-12-21 ENCOUNTER — Ambulatory Visit: Payer: Medicare Other | Admitting: Pediatrics

## 2016-12-21 DIAGNOSIS — K5900 Constipation, unspecified: Secondary | ICD-10-CM

## 2016-12-21 MED ORDER — LUBIPROSTONE 24 MCG PO CAPS: 24.0000 ug | ORAL_CAPSULE | Freq: Two times a day (BID) | ORAL | 1 refills | Status: DC

## 2016-12-21 NOTE — Telephone Encounter (Signed)
Husband states that she had an apt with [name] at 2:15 today but she was too sick to come in. States patient has been on the toilet with abdominal pain and constipation. They have tried otc medications and nothing is working. Would like something called into [facility] in [place]. Please advise

## 2016-12-21 NOTE — Telephone Encounter (Signed)
Returned [employer] phone call.  Husband states that patient has not had a BM for 7 or more days with abdominal pain.  Patient has tried 4 enemas, miralax, and phillips.  Informed patient's husband that patient may use mag citrate or ducolax along with new Rx of amitiza. Goal is to have a BM daily and if does not have BM daily patient is to increase Miralax.  Husband verbalized understanding.

## 2016-12-21 NOTE — Progress Notes (Signed)
Decreased stooling, on oxycodone for chronic cancer related pain Stat amitiza Discussed OTC meds, plenty of fluids, if not improving needs to be seen

## 2016-12-24 ENCOUNTER — Other Ambulatory Visit: Payer: Self-pay | Admitting: Family

## 2016-12-24 DIAGNOSIS — E038 Other specified hypothyroidism: Secondary | ICD-10-CM

## 2016-12-25 DIAGNOSIS — E669 Obesity, unspecified: Secondary | ICD-10-CM | POA: Diagnosis not present

## 2016-12-25 DIAGNOSIS — C787 Secondary malignant neoplasm of liver and intrahepatic bile duct: Secondary | ICD-10-CM | POA: Diagnosis not present

## 2016-12-25 DIAGNOSIS — I480 Paroxysmal atrial fibrillation: Secondary | ICD-10-CM | POA: Diagnosis not present

## 2016-12-25 DIAGNOSIS — G319 Degenerative disease of nervous system, unspecified: Secondary | ICD-10-CM | POA: Diagnosis not present

## 2016-12-25 DIAGNOSIS — C801 Malignant (primary) neoplasm, unspecified: Secondary | ICD-10-CM | POA: Diagnosis not present

## 2016-12-25 DIAGNOSIS — I10 Essential (primary) hypertension: Secondary | ICD-10-CM | POA: Diagnosis not present

## 2016-12-26 ENCOUNTER — Ambulatory Visit (INDEPENDENT_AMBULATORY_CARE_PROVIDER_SITE_OTHER): Payer: Medicare Other | Admitting: *Deleted

## 2016-12-26 DIAGNOSIS — R002 Palpitations: Secondary | ICD-10-CM

## 2016-12-27 ENCOUNTER — Ambulatory Visit (INDEPENDENT_AMBULATORY_CARE_PROVIDER_SITE_OTHER): Payer: Medicare Other | Admitting: Pediatrics

## 2016-12-27 DIAGNOSIS — G319 Degenerative disease of nervous system, unspecified: Secondary | ICD-10-CM | POA: Diagnosis not present

## 2016-12-27 DIAGNOSIS — C787 Secondary malignant neoplasm of liver and intrahepatic bile duct: Secondary | ICD-10-CM

## 2016-12-27 DIAGNOSIS — I480 Paroxysmal atrial fibrillation: Secondary | ICD-10-CM | POA: Diagnosis not present

## 2016-12-27 DIAGNOSIS — E669 Obesity, unspecified: Secondary | ICD-10-CM | POA: Diagnosis not present

## 2016-12-27 DIAGNOSIS — I10 Essential (primary) hypertension: Secondary | ICD-10-CM | POA: Diagnosis not present

## 2016-12-27 DIAGNOSIS — C801 Malignant (primary) neoplasm, unspecified: Secondary | ICD-10-CM | POA: Diagnosis not present

## 2016-12-27 NOTE — Progress Notes (Signed)
[facility] Summary Report 

## 2016-12-28 ENCOUNTER — Encounter: Payer: Self-pay | Admitting: Pediatrics

## 2016-12-28 ENCOUNTER — Ambulatory Visit (INDEPENDENT_AMBULATORY_CARE_PROVIDER_SITE_OTHER): Payer: Medicare Other | Admitting: Pediatrics

## 2016-12-28 VITALS — BP 119/71 | HR 82 | Temp 97.8°F | Ht 64.0 in | Wt 225.0 lb

## 2016-12-28 DIAGNOSIS — G8929 Other chronic pain: Secondary | ICD-10-CM | POA: Diagnosis not present

## 2016-12-28 DIAGNOSIS — R05 Cough: Secondary | ICD-10-CM

## 2016-12-28 DIAGNOSIS — I10 Essential (primary) hypertension: Secondary | ICD-10-CM | POA: Diagnosis not present

## 2016-12-28 DIAGNOSIS — M25561 Pain in right knee: Secondary | ICD-10-CM | POA: Diagnosis not present

## 2016-12-28 DIAGNOSIS — R059 Cough, unspecified: Secondary | ICD-10-CM

## 2016-12-28 DIAGNOSIS — I480 Paroxysmal atrial fibrillation: Secondary | ICD-10-CM | POA: Diagnosis not present

## 2016-12-28 LAB — CUP PACEART REMOTE DEVICE CHECK
Date Time Interrogation Session: 20180530183907
MDC IDC PG IMPLANT DT: 20160609

## 2016-12-28 MED ORDER — BENZONATATE 100 MG PO CAPS: 100.0000 mg | ORAL_CAPSULE | Freq: Three times a day (TID) | ORAL | 1 refills | Status: DC | PRN

## 2016-12-28 MED FILL — BENZONATATE 100 MG CAP: 100 | 6 days supply | Qty: 20 | Fill #0

## 2016-12-28 NOTE — Progress Notes (Signed)
  Subjective:   Patient ID: [name], female    DOB: 25-Apr-1934, 83 y.o.   MRN: [id number] CC: Hospitalization Follow-up (Elevated BP)  HPI: [name] is a 82 y.o. female presenting for Hospitalization Follow-up (Elevated BP)   The patient was discharged on 5/18 with a primary diagnosis of chest pain.   Says she had had episode of sharp chest pain during scheduled procedure with VIR. Was referred to the ED then admitted for observation. Chest pain resolved with dilaudid and did not return.  BP elevated initially, has been taking meds since discharge. No further chest pain, no SOB or headaches  Had episode of constipation that resolved with OTC meds. Taking daily stool softener now and having near daily soft bowel movements. Takes miralax if needed.  Following with oncology for neuroendocrine tumor with liver mets. Pt says she has not had any pain. Other than the constipation as above which has resolved she has been feeling well. Does worry about her kids and what to tell them about how she is doing. Says day to day she has been feeling normal.   Afib: No chest palpitations  Relevant past medical, surgical, family and social history reviewed. Allergies and medications reviewed and updated. History  Smoking Status  . Former Smoker  . Packs/day: 0.25  . Years: 10.00  . Types: Cigarettes  . Quit date: 07/31/1975  Smokeless Tobacco  . Never Used   ROS: Per HPI   Objective:    BP 119/71   Pulse 82   Temp 97.8 F (36.6 C) (Oral)   Ht _0  (1.626 m)   Wt 225 lb (102.1 kg)   BMI 38.62 kg/m   Wt Readings from Last 3 Encounters:  12/28/16 225 lb (102.1 kg)  12/13/16 224 lb 3.3 oz (101.7 kg)  12/05/16 226 lb 9.6 oz (102.8 kg)    Gen: NAD, alert, cooperative with exam, NCAT, in wheelchair EYES: EOMI, no conjunctival injection, or no icterus ENT:   OP without erythema LYMPH: no cervical LAD CV: NRRR, normal S1/S2, no murmur, distal pulses 2+ b/l Resp: CTABL, no  wheezes, normal WOB Abd: +BS, soft, NTND. no guarding or organomegaly Ext: No edema, warm Neuro: Alert and oriented  Assessment & Plan:  [name] was seen today for hospitalization follow-up.  Diagnoses and all orders for this visit:  Paroxysmal atrial fibrillation ([name]) Stable, rate controlled,  not on anticoagulation due to AVMs  Essential hypertension Well controlled today, cont current meds -     CMP14+EGFR  Chronic pain of right knee Followed with Dr. Gladstone Lighter in the past, wants to follow up there -     Ambulatory referral to Orthopedic Surgery  Cough Says pain medications sometimes make her cough, resolves with below Taking rarely but would like ot have on hand -     benzonatate (TESSALON) 100 MG capsule; Take 1 capsule (100 mg total) by mouth 3 (three) times daily as needed for cough.   Follow up plan: Return in about 4 weeks (around 01/25/2017). Assunta Found, MD [place]

## 2016-12-29 LAB — CMP14+EGFR
ALK PHOS: 86 IU/L (ref 39–117)
ALT: 14 IU/L (ref 0–32)
AST: 22 IU/L (ref 0–40)
Albumin/Globulin Ratio: 1.4 (ref 1.2–2.2)
Albumin: 3.8 g/dL (ref 3.5–4.7)
BUN / CREAT RATIO: 12 (ref 12–28)
BUN: 10 mg/dL (ref 8–27)
Bilirubin Total: 0.3 mg/dL (ref 0.0–1.2)
CO2: 23 mmol/L (ref 18–29)
CREATININE: 0.85 mg/dL (ref 0.57–1.00)
Calcium: 10 mg/dL (ref 8.7–10.3)
Chloride: 100 mmol/L (ref 96–106)
GFR calc Af Amer: 75 mL/min/{1.73_m2} (ref >59)
GFR calc non Af Amer: 65 mL/min/{1.73_m2} (ref >59)
Globulin, Total: 2.8 g/dL (ref 1.5–4.5)
Glucose: 102 mg/dL — ABNORMAL HIGH (ref 65–99)
Potassium: 5.2 mmol/L (ref 3.5–5.2)
Sodium: 138 mmol/L (ref 134–144)
TOTAL PROTEIN: 6.6 g/dL (ref 6.0–8.5)

## 2016-12-31 DIAGNOSIS — I480 Paroxysmal atrial fibrillation: Secondary | ICD-10-CM | POA: Diagnosis not present

## 2016-12-31 DIAGNOSIS — E669 Obesity, unspecified: Secondary | ICD-10-CM | POA: Diagnosis not present

## 2016-12-31 DIAGNOSIS — I10 Essential (primary) hypertension: Secondary | ICD-10-CM | POA: Diagnosis not present

## 2016-12-31 DIAGNOSIS — G319 Degenerative disease of nervous system, unspecified: Secondary | ICD-10-CM | POA: Diagnosis not present

## 2016-12-31 DIAGNOSIS — C787 Secondary malignant neoplasm of liver and intrahepatic bile duct: Secondary | ICD-10-CM | POA: Diagnosis not present

## 2016-12-31 DIAGNOSIS — C801 Malignant (primary) neoplasm, unspecified: Secondary | ICD-10-CM | POA: Diagnosis not present

## 2016-12-31 MED FILL — TEMOZOLOMIDE 140 MG CAPSULE: 140 | 5 days supply | Qty: 10 | Fill #0

## 2016-12-31 MED FILL — CAPECITABINE 500 MG TABLET: 500 | 14 days supply | Qty: 84 | Fill #0

## 2017-01-01 ENCOUNTER — Ambulatory Visit: Payer: Medicare Other | Admitting: Pediatrics

## 2017-01-02 ENCOUNTER — Encounter: Payer: Self-pay | Admitting: Cardiology

## 2017-01-02 ENCOUNTER — Ambulatory Visit
Admission: RE | Admit: 2017-01-02 | Discharge: 2017-01-02 | Disposition: A | Discharge status: Home / Self Care | Payer: Medicare Other | Source: Ambulatory Visit | Attending: Radiology | Admitting: Radiology

## 2017-01-02 ENCOUNTER — Ambulatory Visit: Payer: Medicare Other | Admitting: Internal Medicine

## 2017-01-02 ENCOUNTER — Other Ambulatory Visit: Payer: Medicare Other

## 2017-01-02 DIAGNOSIS — C7B8 Other secondary neuroendocrine tumors: Secondary | ICD-10-CM

## 2017-01-02 DIAGNOSIS — C787 Secondary malignant neoplasm of liver and intrahepatic bile duct: Secondary | ICD-10-CM | POA: Diagnosis not present

## 2017-01-02 HISTORY — PX: IR RADIOLOGIST EVAL & MGMT: IMG5224

## 2017-01-02 NOTE — Progress Notes (Signed)
Patient ID: [name], female   DOB: Oct 29, 1933, 83 y.o.   MRN: [id number]         Chief Complaint  Patient presents with  . Follow-up    3 wk follow up Y-90 SIRT      Referring Physician(s): Mohamed  History of Present Illness:  [name] is a 83 y.o. female with complex past medical history significant for bradycardia, paroxysmal atrial fibrillation, chronic pericarditis, CVA and cerebellar degeneration (patient is wheelchair bound and requires assistance for transferring), hypertension and hyperlipidemia, who has recently been diagnosed with metastatic neuroendocrine tumor of the lung following ultrasound-guided liver lesion biopsy on 08/20/2016.  Patiently initially started on treatment with alternating two-week cycles of Xeloda and Temodar and while she has been tolerating the treatment well, CT scan of the abdomen and pelvis performed for the evaluation of right groin pain demonstrates progression of known hepatic metastatic disease and as such, patient was referred for evaluation of candidacy for hepatic directed therapy at the request of Dr. Shirline Frees and underwent technically successful mapping Y 90 radioembolization on 11/28/2016 and subsequent Y 90 radioembolization of the right lobe of the liver on 12/13/2016.  The patient returns to the interventional radiology clinic for post procedural evaluation prior to potential Y 90 radioembolization of the left lobe of the liver. The patient is again accompanied by her husband though serves as her own historian.  Unfortunately, the patient tolerated the Y 90 poorly with the acute onset of nonspecific chest and abdominal pain which coincided with the beginning of the Y-90 administration.  As such the entire calculated Y-90 dose was unable to be administered.  Subsequent cardiac workup was negative however patient's abdominal pain persisted for approximately 5 days following the procedure, though she states that it resolved nearly as  quickly as it occured.  Patient states she has recently experienced constipation though this has resolved. She is currently without complaint and is her baseline state of poor health.  The patient remains  interested in pursuing all potential treatment options.   Past Medical History:  Diagnosis Date  . Acute respiratory failure with hypoxia ([name]) 10/23/2015  . Allergic rhinitis    PT. DENIES  . Anxiety   . Arthritis    NECK  . Ataxia   . Bradycardia    primarily nocturnal  . Burning tongue syndrome 25 years  . Cancer ([name])   . Cataract   . Cerebellar degeneration   . Chronic pericarditis   . Chronic urinary tract infection   . Complication of anesthesia    low o2 sats, coded 30 years ago  . CVA (cerebral infarction) 05/2003  . Depression   . Encounter for antineoplastic chemotherapy 10/09/2016  . Gait disorder   . Gastric polyps   . GERD (gastroesophageal reflux disease)   . Goals of care, counseling/discussion 10/09/2016  . High cholesterol   . Hyperlipidemia   . Hypertension   . Hypertension   . Hypotension   . Hypothyroidism   . IBS (irritable bowel syndrome)   . Obesity   . Paroxysmal atrial fibrillation ([name])    chads2vasc score is 6,  she is felt to be a poor candidate for anticoagulation  . Personal history of arterial venous malformation (AVM)    right side of face  . Seizure disorder ([name])   . Seizures ([name]) 2003   " smelling"- Gabapentin "no problem"  . Shortness of breath dyspnea    with exertion  . Sternum fx 10/27/2013  .  Stroke [facility]) 5 years ago   Right side of face weak, slurred speach-   . Thyroid disease   . TIA (transient ischemic attack)   . UTI (lower urinary tract infection) 03/27/2016   "frequently"    Past Surgical History:  Procedure Laterality Date  . APPENDECTOMY  83 years old  . COLONOSCOPY  2006, 2009  . COLONOSCOPY WITH PROPOFOL N/A 03/28/2016   Procedure: COLONOSCOPY WITH PROPOFOL;  Surgeon: Iva Boop, MD;  Location: [facility]  ENDOSCOPY;  Service: Endoscopy;  Laterality: N/A;  . cyst removed  35 years ago  . EP IMPLANTABLE DEVICE N/A 01/06/2015   Procedure: Loop Recorder Insertion;  Surgeon: Hillis Range, MD;  Location: [facility] INVASIVE CV LAB;  Service: Cardiovascular;  Laterality: N/A;  . EYE SURGERY Right    Cataract  . IR ANGIOGRAM SELECTIVE EACH ADDITIONAL VESSEL  11/28/2016  . IR ANGIOGRAM SELECTIVE EACH ADDITIONAL VESSEL  11/28/2016  . IR ANGIOGRAM SELECTIVE EACH ADDITIONAL VESSEL  11/28/2016  . IR ANGIOGRAM SELECTIVE EACH ADDITIONAL VESSEL  11/28/2016  . IR ANGIOGRAM SELECTIVE EACH ADDITIONAL VESSEL  12/13/2016  . IR ANGIOGRAM SELECTIVE EACH ADDITIONAL VESSEL  12/13/2016  . IR ANGIOGRAM VISCERAL SELECTIVE  11/28/2016  . IR ANGIOGRAM VISCERAL SELECTIVE  11/28/2016  . IR ANGIOGRAM VISCERAL SELECTIVE  12/13/2016  . IR ANGIOGRAM VISCERAL SELECTIVE  12/13/2016  . IR EMBO ARTERIAL NOT HEMORR HEMANG INC GUIDE ROADMAPPING  11/28/2016  . IR EMBO TUMOR ORGAN ISCHEMIA INFARCT INC GUIDE ROADMAPPING  12/13/2016  . IR US GUIDE VASC ACCESS RIGHT  11/28/2016  . IR US GUIDE VASC ACCESS RIGHT  12/13/2016  . KNEE ARTHROSCOPY Right 11/14/2006  . KNEE ARTHROSCOPY Bilateral 5 and 6 years ago  . KNEE ARTHROSCOPY WITH LATERAL MENISECTOMY  07/03/2012   Procedure: KNEE ARTHROSCOPY WITH LATERAL MENISECTOMY;  Surgeon: Drucilla Schmidt, MD;  Location: [facility] ORS;  Service: Orthopedics;  Laterality: Left;  with Partial Lateral Menisectomy and Medial Menisectomy. Shaving of medial and lateral femoral condyles. Shaving of patella. Removal of a loose body  . tibial and fibular internal fixation Left   . TOTAL ABDOMINAL HYSTERECTOMY  83 years old  . UPPER GASTROINTESTINAL ENDOSCOPY  2009, 2013    Allergies: Lisinopril  Medications: Prior to Admission medications   Medication Sig Start Date End Date Taking? Authorizing Provider  amitriptyline (ELAVIL) 25 MG tablet Take 1 tablet (25 mg total) by mouth at bedtime. 10/01/16  Yes Johna Sheriff, MD  capecitabine  (XELODA) 500 MG tablet Take 3 tablets (1,500 mg total) by mouth 2 (two) times daily after a meal. 12/05/16  Yes Si Gaul, MD  escitalopram (LEXAPRO) 20 MG tablet Take 1 tablet (20 mg total) by mouth at bedtime. 10/01/16  Yes Johna Sheriff, MD  fluticasone (FLONASE) 50 MCG/ACT nasal spray Place 2 sprays into both nostrils at bedtime. 09/20/16  Yes Johna Sheriff, MD  furosemide (LASIX) 40 MG tablet Take 40 mg by mouth daily as needed for edema.    Yes [provider]  gabapentin (NEURONTIN) 300 MG capsule Take 1-2 capsules (300-600 mg total) by mouth 4 (four) times daily. 08/23/16  Yes Johna Sheriff, MD  hydroxypropyl methylcellulose / hypromellose (ISOPTO TEARS / GONIOVISC) 2.5 % ophthalmic solution Place 1 drop into both eyes at bedtime.   Yes [provider]  levothyroxine (SYNTHROID, LEVOTHROID) 75 MCG tablet TAKE 1 TABLET DAILY BEFORE BREAKFAST 12/25/16  Yes Johna Sheriff, MD  losartan (COZAAR) 50 MG tablet TAKE 1 TABLET DAILY 07/17/16  Yes  Evelina Dun A, FNP  lovastatin (MEVACOR) 40 MG tablet TAKE 1 TABLET AT BEDTIME 10/25/16  Yes Eustaquio Maize, MD  Melatonin 3 MG TBDP Take 3-6 mg by mouth at bedtime as needed. Patient taking differently: Take 3-6 mg by mouth at bedtime as needed (for sleep).  08/02/16  Yes Eustaquio Maize, MD  omeprazole (PRILOSEC) 40 MG capsule TAKE 1 CAPSULE DAILY 10/25/16  Yes Eustaquio Maize, MD  oxyCODONE-acetaminophen (PERCOCET) 5-325 MG tablet Take 1-2 tablets by mouth every 4 (four) hours as needed. 12/05/16  Yes Curt Bears, MD  temozolomide (TEMODAR) 140 MG capsule Take 2 capsules (280 mg total) by mouth daily. May take on an empty stomach or at bedtime to decrease nausea & vomiting. 12/05/16  Yes Curt Bears, MD  Vitamin D, Ergocalciferol, (DRISDOL) 50000 units CAPS capsule Take 1 capsule (50,000 Units total) by mouth every 7 (seven) days. Patient taking differently: Take 50,000 Units by mouth every Friday.  08/23/16  Yes  Eustaquio Maize, MD  benzonatate (TESSALON) 100 MG capsule Take 1 capsule (100 mg total) by mouth 3 (three) times daily as needed for cough. Patient not taking: Reported on 01/02/2017 12/28/16   Eustaquio Maize, MD  lubiprostone (AMITIZA) 24 MCG capsule Take 1 capsule (24 mcg total) by mouth 2 (two) times daily with a meal. Patient not taking: Reported on 01/02/2017 12/21/16   Eustaquio Maize, MD  nitrofurantoin (MACRODANTIN) 50 MG capsule Take 50 mg by mouth at bedtime.    [provider]  ondansetron (ZOFRAN) 8 MG tablet Take 1 tablet (8 mg total) by mouth every 8 (eight) hours as needed for nausea or vomiting. Patient not taking: Reported on 01/02/2017 10/09/16   Curt Bears, MD     Family History  Problem Relation Age of Onset  . Heart attack Father 56       fatal  . Coronary artery disease Brother   . Diabetes Brother   . Prostate cancer Brother   . Prostate cancer Son     Social History   Social History  . Marital status: Married    Spouse name: N/A  . Number of children: N/A  . Years of education: N/A   Social History Main Topics  . Smoking status: Former Smoker    Packs/day: 0.25    Years: 10.00    Types: Cigarettes    Quit date: 07/31/1975  . Smokeless tobacco: Never Used  . Alcohol use No     Comment: Rare- maybe a drink ever 2 years  . Drug use: No  . Sexual activity: Not on file   Other Topics Concern  . Not on file   Social History Narrative   Lives with husband, does have stairs, does not use them. Pt completed 10th grade.    ECOG Status: 2 - Symptomatic, <50% confined to bed  Review of Systems: A 12 point ROS discussed and pertinent positives are indicated in the HPI above.  All other systems are negative.  Review of Systems  Constitutional: Negative for activity change and fever.  Respiratory: Negative.   Cardiovascular: Negative.   Gastrointestinal: Positive for constipation.       Patient reports recent episode of constipation though  states this has since resolved.  Musculoskeletal: Negative.     Vital Signs: BP 122/69   Pulse 80   Temp 97.7 F (36.5 C) (Oral)   Resp 15   Ht '5\' 4"'$  (1.626 m)   Wt 225 lb (102.1 kg)  SpO2 96%   BMI 38.62 kg/m   Physical Exam  Constitutional: She appears well-developed and well-nourished.  HENT:  Head: Normocephalic and atraumatic.  Skin: Skin is warm and dry.  Nursing note and vitals reviewed.   Imaging: Nm Liver Img Spect  Result Date: 12/13/2016 CLINICAL DATA:  Metastatic malignant neuroendocrine tumor to the liver. EXAM: NUCLEAR MEDICINE SPECIAL MED RAD PHYSICS CONS; NUCLEAR MEDICINE RADIO PHARM THERAPY INTRA ARTERIAL; NUCLEAR MEDICINE TREATMENT PROCEDURE; NUCLEAR MEDICINE LIVER SCAN TECHNIQUE: In conjunction with the interventional radiologist a Y- Microsphere dose was calculated utilizing body surface area formulation. Calculated dose equal 36.99 mCi. Pre therapy MAA liver SPECT scan and CTA were evaluated. Utilizing a microcatheter system, the hepatic artery was selected and Y-90 microspheres were delivered in fractionated aliquots. Radiopharmaceutical was delivered by the interventional radiologist and nuclear radiologist. [name] through the infusion patient reported back pain and the patient's blood pressure was noted to be increased and the procedure was then terminated. The patient tolerated procedure well. No adverse effects were noted. Bremsstrahlung planar and SPECT imaging of the abdomen following intrahepatic arterial delivery of Y-90 microsphere was performed. RADIOPHARMACEUTICALS:  A total of 54.62 millicuries of Y- 90 microsphere administered. COMPARISON:  10/02/2016 FINDINGS: Y - 90 microspheres therapy as above. First therapy the right hepatic lobe. Bremsstrahlung planar and SPECT imaging of the abdomen following intrahepatic arterial delivery of Y-73mcrosphere demonstrates radioactivity localized to the right hepatic lobe. No evidence of extrahepatic activity.  IMPRESSION: Successful Y - 90 microsphere delivery for treatment of unresectable liver metastasis. First therapy to the right lobe. Bremssstrahlung scan demonstrates activity localized to right hepatic lobe with no extrahepatic activity identified. Electronically Signed   By: TKerby MoorsM.D.   On: 12/13/2016 13:39   Ir Angiogram Visceral Selective  Result Date: 12/13/2016 INDICATION: History of metastatic neuroendocrine tumor. Patient returns today for left hepatic arteriogram and potential percutaneous coil embolization of a suspected falciform artery as well as replaced right hepatic arteriogram and radioembolization of the right lobe of the liver. EXAM: 1. CELIAC ARTERIOGRAM 2. SELECTIVE LEFT HEPATIC ARTERIOGRAM WITH THE ACQUISITION OF DYNA CT IMAGES 3. SUPERIOR MESENTERIC ARTERIOGRAM 4. SELECTIVE REPLACED RIGHT HEPATIC ARTERIOGRAM 5. TRANSCATHETER YTTRIUM-90 RADIOEMBOLIZATION OF THE REPLACED RIGHT HEPATIC ARTERY 6. FLUOROSCOPIC GUIDED ADMINISTRATION OF SIR-SPHERES 7. ULTRASOUND GUIDED VASCULAR ACCESS OF THE RIGHT COMMON FEMORAL ARTERY COMPARISON:  Mapping pre Y 90 radioembolization - 12/13/2016; CT of the abdomen pelvis - 11/13/2016 MEDICATIONS: Protonix 40 mg IV; Decadron 4 mg IV; Toradol 30 mg IV; Sandostatin 200 mg IV CONTRAST:  125 cc Isovue-300 ANESTHESIA/SEDATION: Moderate (conscious) sedation was employed during this procedure. A total of Versed 2 mg and Fentanyl 100 mcg was administered intravenously. Moderate Sedation Time: 143 minutes. The patient's level of consciousness and vital signs were monitored continuously by radiology nursing throughout the procedure under my direct supervision. FLUOROSCOPY TIME:  12 minutes 12 seconds (3,316 mGy). ACCESS: Right common femoral artery; hemostasis achieved with manual compression. COMPLICATIONS: SIR LEVEL B - Normal therapy, includes overnight admission for observation. Patient develop chest and midline back pain during the radioembolization of the  right lobe of the liver and the complete dose of SIR spheres was unable to be administered. Note, postprocedural ECG was negative for evidence of ischemia and post procedural labs were negative for a troponin elevation. TECHNIQUE: Informed written consent was obtained from the patient after a discussion of the risks, benefits and alternatives to treatment. Questions regarding the procedure were encouraged and answered. A timeout was performed prior to the  initiation of the procedure. The right groin was prepped and draped in the usual sterile fashion, and a sterile drape was applied covering the operative field. Maximum barrier sterile technique with sterile gowns and gloves were used for the procedure. A timeout was performed prior to the initiation of the procedure. Local anesthesia was provided with 1% lidocaine. The right femoral head was marked fluoroscopically. Under direct ultrasound guidance, the right common femoral artery was accessed with a micropuncture kit allowing placement of a of 5-French vascular sheath. An ultrasound image was saved for documentation purposes. A limited arteriogram performed through the side arm of the sheath confirming appropriate access within the right common femoral artery. Over a Britta Mccreedy wire, a Mickelson catheter was advanced to the level of the inferior thoracic aorta where it was reformed, back bled and flushed. The Mickelson catheter was utilized to select the celiac artery and a celiac arteriogram was performed. With the use of a Fathom 14 micro wire, a regular Renegade microcatheter was advanced into the left hepatic artery and multiple selective left hepatic arteriograms were performed in various obliquities including the acquisition of Dyna CT images during selective left hepatic arteriogram. Attention was now paid towards radioembolization of the replaced right hepatic artery. The Ann Held was then utilized select the superior mesenteric artery and a selective  superior mesenteric arteriogram was performed. With the use of a Fathom 14 micro wire, a high-flow Renegade microcatheter was advanced into the distal aspect of the replaced right hepatic artery at the level of the vessel's bifurcation. Contrast injection confirmed appropriate positioning. Radioembolization was then performed with Yttrium-90 SIR Spheres. Particles were administered via a microcatheter utilizing a completely enclosed system. Monitoring of antegrade flow was performed during and after the administration under fluoroscopy with use of contrast intermittently. After the initial dose of the radioembolization, the patient complained of the sudden onset of chest and midline back pain. This is associated with elevation of the patient's blood pressure. Contrast injection continue to demonstrate preserved antegrade flow, however the patient's symptoms persisted. As the patient's symptoms persisted despite temporary halting of the procedure, the decision was made to abandon the procedure prior to the full administration of the radioembolization. Next, the microcatheter, outer catheter and administration system were then discarded into radiation safety receptacle. At this point, the procedure was terminated. The right common femoral approach vascular sheath was removed and hemostasis was achieved with manual compression. A dressing was placed. Postprocedural EKG was negative for evidence of ischemia in subsequent troponin levels were negative. All participants involved with the procedure were surveyed by the radiation safety officer prior to exiting the fluoroscopy suite. The patient was escorted to the nuclear medicine department for post-treatment imaging. 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 Multiple selective left hepatic arteriograms appear to demonstrate a potential falciform artery  which could serve as the source of the non target MAA demonstrated on preceding nuclear medicine planning study. Dyna CT images failed to delineate the origin of this vessel however images are degraded secondary to patient body habitus. As the atretic vessel was attempted to be cannulated, the vessel apparently spasm and was no longer visualized. As such, percutaneous coil embolization of the presumed falciform artery was not performed. 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 Again, there are no definitive abnormal vessels arising from the replaced SMA via the intestines. Successful administration of a partial dose of  SIR spheres from the distal aspect of the right SMA the level the vessel's bifurcation. Intermittent contrast injections during and after the radioembolization confirming preserved antegrade flow without reflux. As above, the complete calculated dose of SIR spheres was unable to be administered secondary to patient's development of unexplained chest and back pain shortly after the beginning of the radioembolization. IMPRESSION: Technically successful radioembolization of the replaced right hepatic artery with Yttrium-90 microspheres. PLAN: - Patient will be seen in the interventional radiology clinic in 3-4 weeks with the acquisition of LFTs. - As the patient tolerated today's procedure poorly, I am uncertain whether we will be able to proceed with completion radioembolization of the left lobe of the liver. Electronically Signed   By: Sandi Mariscal M.D.   On: 12/13/2016 17:27   Ir Angiogram Visceral Selective  Result Date: 12/13/2016 INDICATION: History of metastatic neuroendocrine tumor. Patient returns today for left hepatic arteriogram and potential percutaneous coil embolization of a suspected falciform artery as well as replaced right hepatic arteriogram and radioembolization of the right lobe of the liver. EXAM: 1.  CELIAC ARTERIOGRAM 2. SELECTIVE LEFT HEPATIC ARTERIOGRAM WITH THE ACQUISITION OF DYNA CT IMAGES 3. SUPERIOR MESENTERIC ARTERIOGRAM 4. SELECTIVE REPLACED RIGHT HEPATIC ARTERIOGRAM 5. TRANSCATHETER YTTRIUM-90 RADIOEMBOLIZATION OF THE REPLACED RIGHT HEPATIC ARTERY 6. FLUOROSCOPIC GUIDED ADMINISTRATION OF SIR-SPHERES 7. ULTRASOUND GUIDED VASCULAR ACCESS OF THE RIGHT COMMON FEMORAL ARTERY COMPARISON:  Mapping pre Y 90 radioembolization - 12/13/2016; CT of the abdomen pelvis - 11/13/2016 MEDICATIONS: Protonix 40 mg IV; Decadron 4 mg IV; Toradol 30 mg IV; Sandostatin 200 mg IV CONTRAST:  125 cc Isovue-300 ANESTHESIA/SEDATION: Moderate (conscious) sedation was employed during this procedure. A total of Versed 2 mg and Fentanyl 100 mcg was administered intravenously. Moderate Sedation Time: 143 minutes. The patient's level of consciousness and vital signs were monitored continuously by radiology nursing throughout the procedure under my direct supervision. FLUOROSCOPY TIME:  12 minutes 12 seconds (3,316 mGy). ACCESS: Right common femoral artery; hemostasis achieved with manual compression. COMPLICATIONS: SIR LEVEL B - Normal therapy, includes overnight admission for observation. Patient develop chest and midline back pain during the radioembolization of the right lobe of the liver and the complete dose of SIR spheres was unable to be administered. Note, postprocedural ECG was negative for evidence of ischemia and post procedural labs were negative for a troponin elevation. TECHNIQUE: Informed written consent was obtained from the patient after a discussion of the risks, benefits and alternatives to treatment. Questions regarding the procedure were encouraged and answered. A timeout was performed prior to the initiation of the procedure. The right groin was prepped and draped in the usual sterile fashion, and a sterile drape was applied covering the operative field. Maximum barrier sterile technique with sterile gowns and  gloves were used for the procedure. A timeout was performed prior to the initiation of the procedure. Local anesthesia was provided with 1% lidocaine. The right femoral head was marked fluoroscopically. Under direct ultrasound guidance, the right common femoral artery was accessed with a micropuncture kit allowing placement of a of 5-French vascular sheath. An ultrasound image was saved for documentation purposes. A limited arteriogram performed through the side arm of the sheath confirming appropriate access within the right common femoral artery. Over a Britta Mccreedy wire, a Mickelson catheter was advanced to the level of the inferior thoracic aorta where it was reformed, back bled and flushed. The Mickelson catheter was utilized to select the celiac artery and a celiac arteriogram was performed. With the use of a  Fathom 14 micro wire, a regular Renegade microcatheter was advanced into the left hepatic artery and multiple selective left hepatic arteriograms were performed in various obliquities including the acquisition of Dyna CT images during selective left hepatic arteriogram. Attention was now paid towards radioembolization of the replaced right hepatic artery. The Ann Held was then utilized select the superior mesenteric artery and a selective superior mesenteric arteriogram was performed. With the use of a Fathom 14 micro wire, a high-flow Renegade microcatheter was advanced into the distal aspect of the replaced right hepatic artery at the level of the vessel's bifurcation. Contrast injection confirmed appropriate positioning. Radioembolization was then performed with Yttrium-90 SIR Spheres. Particles were administered via a microcatheter utilizing a completely enclosed system. Monitoring of antegrade flow was performed during and after the administration under fluoroscopy with use of contrast intermittently. After the initial dose of the radioembolization, the patient complained of the sudden onset of chest and  midline back pain. This is associated with elevation of the patient's blood pressure. Contrast injection continue to demonstrate preserved antegrade flow, however the patient's symptoms persisted. As the patient's symptoms persisted despite temporary halting of the procedure, the decision was made to abandon the procedure prior to the full administration of the radioembolization. Next, the microcatheter, outer catheter and administration system were then discarded into radiation safety receptacle. At this point, the procedure was terminated. The right common femoral approach vascular sheath was removed and hemostasis was achieved with manual compression. A dressing was placed. Postprocedural EKG was negative for evidence of ischemia in subsequent troponin levels were negative. All participants involved with the procedure were surveyed by the radiation safety officer prior to exiting the fluoroscopy suite. The patient was escorted to the nuclear medicine department for post-treatment imaging. 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 Multiple selective left hepatic arteriograms appear to demonstrate a potential falciform artery which could serve as the source of the non target MAA demonstrated on preceding nuclear medicine planning study. Dyna CT images failed to delineate the origin of this vessel however images are degraded secondary to patient body habitus. As the atretic vessel was attempted to be cannulated, the vessel apparently spasm and was no longer visualized. As such, percutaneous coil embolization of the presumed falciform artery was not performed. 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 Again, there are no definitive abnormal vessels arising from the replaced SMA via the intestines. Successful administration of a  partial dose of SIR spheres from the distal aspect of the right SMA the level the vessel's bifurcation. Intermittent contrast injections during and after the radioembolization confirming preserved antegrade flow without reflux. As above, the complete calculated dose of SIR spheres was unable to be administered secondary to patient's development of unexplained chest and back pain shortly after the beginning of the radioembolization. IMPRESSION: Technically successful radioembolization of the replaced right hepatic artery with Yttrium-90 microspheres. PLAN: - Patient will be seen in the interventional radiology clinic in 3-4 weeks with the acquisition of LFTs. - As the patient tolerated today's procedure poorly, I am uncertain whether we will be able to proceed with completion radioembolization of the left lobe of the liver. Electronically Signed   By: Sandi Mariscal M.D.   On: 12/13/2016 17:27   Ir Angiogram Selective Each Additional Vessel  Result Date: 12/13/2016 INDICATION: History of metastatic neuroendocrine tumor. Patient returns today for left hepatic arteriogram and potential percutaneous  coil embolization of a suspected falciform artery as well as replaced right hepatic arteriogram and radioembolization of the right lobe of the liver. EXAM: 1. CELIAC ARTERIOGRAM 2. SELECTIVE LEFT HEPATIC ARTERIOGRAM WITH THE ACQUISITION OF DYNA CT IMAGES 3. SUPERIOR MESENTERIC ARTERIOGRAM 4. SELECTIVE REPLACED RIGHT HEPATIC ARTERIOGRAM 5. TRANSCATHETER YTTRIUM-90 RADIOEMBOLIZATION OF THE REPLACED RIGHT HEPATIC ARTERY 6. FLUOROSCOPIC GUIDED ADMINISTRATION OF SIR-SPHERES 7. ULTRASOUND GUIDED VASCULAR ACCESS OF THE RIGHT COMMON FEMORAL ARTERY COMPARISON:  Mapping pre Y 90 radioembolization - 12/13/2016; CT of the abdomen pelvis - 11/13/2016 MEDICATIONS: Protonix 40 mg IV; Decadron 4 mg IV; Toradol 30 mg IV; Sandostatin 200 mg IV CONTRAST:  125 cc Isovue-300 ANESTHESIA/SEDATION: Moderate (conscious) sedation was employed during  this procedure. A total of Versed 2 mg and Fentanyl 100 mcg was administered intravenously. Moderate Sedation Time: 143 minutes. The patient's level of consciousness and vital signs were monitored continuously by radiology nursing throughout the procedure under my direct supervision. FLUOROSCOPY TIME:  12 minutes 12 seconds (3,316 mGy). ACCESS: Right common femoral artery; hemostasis achieved with manual compression. COMPLICATIONS: SIR LEVEL B - Normal therapy, includes overnight admission for observation. Patient develop chest and midline back pain during the radioembolization of the right lobe of the liver and the complete dose of SIR spheres was unable to be administered. Note, postprocedural ECG was negative for evidence of ischemia and post procedural labs were negative for a troponin elevation. TECHNIQUE: Informed written consent was obtained from the patient after a discussion of the risks, benefits and alternatives to treatment. Questions regarding the procedure were encouraged and answered. A timeout was performed prior to the initiation of the procedure. The right groin was prepped and draped in the usual sterile fashion, and a sterile drape was applied covering the operative field. Maximum barrier sterile technique with sterile gowns and gloves were used for the procedure. A timeout was performed prior to the initiation of the procedure. Local anesthesia was provided with 1% lidocaine. The right femoral head was marked fluoroscopically. Under direct ultrasound guidance, the right common femoral artery was accessed with a micropuncture kit allowing placement of a of 5-French vascular sheath. An ultrasound image was saved for documentation purposes. A limited arteriogram performed through the side arm of the sheath confirming appropriate access within the right common femoral artery. Over a Britta Mccreedy wire, a Mickelson catheter was advanced to the level of the inferior thoracic aorta where it was reformed, back  bled and flushed. The Mickelson catheter was utilized to select the celiac artery and a celiac arteriogram was performed. With the use of a Fathom 14 micro wire, a regular Renegade microcatheter was advanced into the left hepatic artery and multiple selective left hepatic arteriograms were performed in various obliquities including the acquisition of Dyna CT images during selective left hepatic arteriogram. Attention was now paid towards radioembolization of the replaced right hepatic artery. The Ann Held was then utilized select the superior mesenteric artery and a selective superior mesenteric arteriogram was performed. With the use of a Fathom 14 micro wire, a high-flow Renegade microcatheter was advanced into the distal aspect of the replaced right hepatic artery at the level of the vessel's bifurcation. Contrast injection confirmed appropriate positioning. Radioembolization was then performed with Yttrium-90 SIR Spheres. Particles were administered via a microcatheter utilizing a completely enclosed system. Monitoring of antegrade flow was performed during and after the administration under fluoroscopy with use of contrast intermittently. After the initial dose of the radioembolization, the patient complained of the sudden onset of chest and midline  back pain. This is associated with elevation of the patient's blood pressure. Contrast injection continue to demonstrate preserved antegrade flow, however the patient's symptoms persisted. As the patient's symptoms persisted despite temporary halting of the procedure, the decision was made to abandon the procedure prior to the full administration of the radioembolization. Next, the microcatheter, outer catheter and administration system were then discarded into radiation safety receptacle. At this point, the procedure was terminated. The right common femoral approach vascular sheath was removed and hemostasis was achieved with manual compression. A dressing was  placed. Postprocedural EKG was negative for evidence of ischemia in subsequent troponin levels were negative. All participants involved with the procedure were surveyed by the radiation safety officer prior to exiting the fluoroscopy suite. The patient was escorted to the nuclear medicine department for post-treatment imaging. 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 Multiple selective left hepatic arteriograms appear to demonstrate a potential falciform artery which could serve as the source of the non target MAA demonstrated on preceding nuclear medicine planning study. Dyna CT images failed to delineate the origin of this vessel however images are degraded secondary to patient body habitus. As the atretic vessel was attempted to be cannulated, the vessel apparently spasm and was no longer visualized. As such, percutaneous coil embolization of the presumed falciform artery was not performed. 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 Again, there are no definitive abnormal vessels arising from the replaced SMA via the intestines. Successful administration of a partial dose of SIR spheres from the distal aspect of the right SMA the level the vessel's bifurcation. Intermittent contrast injections during and after the radioembolization confirming preserved antegrade flow without reflux. As above, the complete calculated dose of SIR spheres was unable to be administered secondary to patient's development of unexplained chest and back pain shortly after the beginning of the radioembolization. IMPRESSION: Technically successful radioembolization of the replaced right hepatic artery with Yttrium-90 microspheres. PLAN: - Patient will be seen in the interventional radiology clinic in 3-4 weeks with the acquisition of LFTs. - As the  patient tolerated today's procedure poorly, I am uncertain whether we will be able to proceed with completion radioembolization of the left lobe of the liver. Electronically Signed   By: Sandi Mariscal M.D.   On: 12/13/2016 17:27   Ir Angiogram Selective Each Additional Vessel  Result Date: 12/13/2016 INDICATION: History of metastatic neuroendocrine tumor. Patient returns today for left hepatic arteriogram and potential percutaneous coil embolization of a suspected falciform artery as well as replaced right hepatic arteriogram and radioembolization of the right lobe of the liver. EXAM: 1. CELIAC ARTERIOGRAM 2. SELECTIVE LEFT HEPATIC ARTERIOGRAM WITH THE ACQUISITION OF DYNA CT IMAGES 3. SUPERIOR MESENTERIC ARTERIOGRAM 4. SELECTIVE REPLACED RIGHT HEPATIC ARTERIOGRAM 5. TRANSCATHETER YTTRIUM-90 RADIOEMBOLIZATION OF THE REPLACED RIGHT HEPATIC ARTERY 6. FLUOROSCOPIC GUIDED ADMINISTRATION OF SIR-SPHERES 7. ULTRASOUND GUIDED VASCULAR ACCESS OF THE RIGHT COMMON FEMORAL ARTERY COMPARISON:  Mapping pre Y 90 radioembolization - 12/13/2016; CT of the abdomen pelvis - 11/13/2016 MEDICATIONS: Protonix 40 mg IV; Decadron 4 mg IV; Toradol 30 mg IV; Sandostatin 200 mg IV CONTRAST:  125 cc Isovue-300 ANESTHESIA/SEDATION: Moderate (conscious) sedation was employed during this procedure. A total of Versed 2 mg and Fentanyl 100 mcg was administered intravenously. Moderate Sedation Time: 143 minutes. The patient's level of consciousness and vital signs were monitored continuously by radiology nursing throughout the procedure under my  direct supervision. FLUOROSCOPY TIME:  12 minutes 12 seconds (3,316 mGy). ACCESS: Right common femoral artery; hemostasis achieved with manual compression. COMPLICATIONS: SIR LEVEL B - Normal therapy, includes overnight admission for observation. Patient develop chest and midline back pain during the radioembolization of the right lobe of the liver and the complete dose of SIR spheres was unable to be  administered. Note, postprocedural ECG was negative for evidence of ischemia and post procedural labs were negative for a troponin elevation. TECHNIQUE: Informed written consent was obtained from the patient after a discussion of the risks, benefits and alternatives to treatment. Questions regarding the procedure were encouraged and answered. A timeout was performed prior to the initiation of the procedure. The right groin was prepped and draped in the usual sterile fashion, and a sterile drape was applied covering the operative field. Maximum barrier sterile technique with sterile gowns and gloves were used for the procedure. A timeout was performed prior to the initiation of the procedure. Local anesthesia was provided with 1% lidocaine. The right femoral head was marked fluoroscopically. Under direct ultrasound guidance, the right common femoral artery was accessed with a micropuncture kit allowing placement of a of 5-French vascular sheath. An ultrasound image was saved for documentation purposes. A limited arteriogram performed through the side arm of the sheath confirming appropriate access within the right common femoral artery. Over a Teena Dunk wire, a Mickelson catheter was advanced to the level of the inferior thoracic aorta where it was reformed, back bled and flushed. The Mickelson catheter was utilized to select the celiac artery and a celiac arteriogram was performed. With the use of a Fathom 14 micro wire, a regular Renegade microcatheter was advanced into the left hepatic artery and multiple selective left hepatic arteriograms were performed in various obliquities including the acquisition of Dyna CT images during selective left hepatic arteriogram. Attention was now paid towards radioembolization of the replaced right hepatic artery. The Ardath Sax was then utilized select the superior mesenteric artery and a selective superior mesenteric arteriogram was performed. With the use of a Fathom 14 micro  wire, a high-flow Renegade microcatheter was advanced into the distal aspect of the replaced right hepatic artery at the level of the vessel's bifurcation. Contrast injection confirmed appropriate positioning. Radioembolization was then performed with Yttrium-90 SIR Spheres. Particles were administered via a microcatheter utilizing a completely enclosed system. Monitoring of antegrade flow was performed during and after the administration under fluoroscopy with use of contrast intermittently. After the initial dose of the radioembolization, the patient complained of the sudden onset of chest and midline back pain. This is associated with elevation of the patient's blood pressure. Contrast injection continue to demonstrate preserved antegrade flow, however the patient's symptoms persisted. As the patient's symptoms persisted despite temporary halting of the procedure, the decision was made to abandon the procedure prior to the full administration of the radioembolization. Next, the microcatheter, outer catheter and administration system were then discarded into radiation safety receptacle. At this point, the procedure was terminated. The right common femoral approach vascular sheath was removed and hemostasis was achieved with manual compression. A dressing was placed. Postprocedural EKG was negative for evidence of ischemia in subsequent troponin levels were negative. All participants involved with the procedure were surveyed by the radiation safety officer prior to exiting the fluoroscopy suite. The patient was escorted to the nuclear medicine department for post-treatment imaging. 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 Multiple selective  left hepatic arteriograms appear to demonstrate a potential falciform artery which could serve as the source of the non target MAA demonstrated on preceding  nuclear medicine planning study. Dyna CT images failed to delineate the origin of this vessel however images are degraded secondary to patient body habitus. As the atretic vessel was attempted to be cannulated, the vessel apparently spasm and was no longer visualized. As such, percutaneous coil embolization of the presumed falciform artery was not performed. 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 Again, there are no definitive abnormal vessels arising from the replaced SMA via the intestines. Successful administration of a partial dose of SIR spheres from the distal aspect of the right SMA the level the vessel's bifurcation. Intermittent contrast injections during and after the radioembolization confirming preserved antegrade flow without reflux. As above, the complete calculated dose of SIR spheres was unable to be administered secondary to patient's development of unexplained chest and back pain shortly after the beginning of the radioembolization. IMPRESSION: Technically successful radioembolization of the replaced right hepatic artery with Yttrium-90 microspheres. PLAN: - Patient will be seen in the interventional radiology clinic in 3-4 weeks with the acquisition of LFTs. - As the patient tolerated today's procedure poorly, I am uncertain whether we will be able to proceed with completion radioembolization of the left lobe of the liver. Electronically Signed   By: Sandi Mariscal M.D.   On: 12/13/2016 17:27   Nm Special Med Rad Physics Cons  Result Date: 12/13/2016 CLINICAL DATA:  Metastatic malignant neuroendocrine tumor to the liver. EXAM: NUCLEAR MEDICINE SPECIAL MED RAD PHYSICS CONS; NUCLEAR MEDICINE RADIO PHARM THERAPY INTRA ARTERIAL; NUCLEAR MEDICINE TREATMENT PROCEDURE; NUCLEAR MEDICINE LIVER SCAN TECHNIQUE: In conjunction with the interventional radiologist a Y- Microsphere dose was calculated utilizing body surface area  formulation. Calculated dose equal 36.99 mCi. Pre therapy MAA liver SPECT scan and CTA were evaluated. Utilizing a microcatheter system, the hepatic artery was selected and Y-90 microspheres were delivered in fractionated aliquots. Radiopharmaceutical was delivered by the interventional radiologist and nuclear radiologist. [name] through the infusion patient reported back pain and the patient's blood pressure was noted to be increased and the procedure was then terminated. The patient tolerated procedure well. No adverse effects were noted. Bremsstrahlung planar and SPECT imaging of the abdomen following intrahepatic arterial delivery of Y-90 microsphere was performed. RADIOPHARMACEUTICALS:  A total of 09.32 millicuries of Y- 90 microsphere administered. COMPARISON:  10/02/2016 FINDINGS: Y - 90 microspheres therapy as above. First therapy the right hepatic lobe. Bremsstrahlung planar and SPECT imaging of the abdomen following intrahepatic arterial delivery of Y-63mcrosphere demonstrates radioactivity localized to the right hepatic lobe. No evidence of extrahepatic activity. IMPRESSION: Successful Y - 90 microsphere delivery for treatment of unresectable liver metastasis. First therapy to the right lobe. Bremssstrahlung scan demonstrates activity localized to right hepatic lobe with no extrahepatic activity identified. Electronically Signed   By: TKerby MoorsM.D.   On: 12/13/2016 13:39   Nm Special Treatment Procedure  Result Date: 12/13/2016 CLINICAL DATA:  Metastatic malignant neuroendocrine tumor to the liver. EXAM: NUCLEAR MEDICINE SPECIAL MED RAD PHYSICS CONS; NUCLEAR MEDICINE RADIO PHARM THERAPY INTRA ARTERIAL; NUCLEAR MEDICINE TREATMENT PROCEDURE; NUCLEAR MEDICINE LIVER SCAN TECHNIQUE: In conjunction with the interventional radiologist a Y- Microsphere dose was calculated utilizing body surface area formulation. Calculated dose equal 36.99 mCi. Pre therapy MAA liver SPECT scan and CTA were evaluated.  Utilizing a microcatheter system, the hepatic artery was selected and Y-90 microspheres  were delivered in fractionated aliquots. Radiopharmaceutical was delivered by the interventional radiologist and nuclear radiologist. Janan Halter through the infusion patient reported back pain and the patient's blood pressure was noted to be increased and the procedure was then terminated. The patient tolerated procedure well. No adverse effects were noted. Bremsstrahlung planar and SPECT imaging of the abdomen following intrahepatic arterial delivery of Y-90 microsphere was performed. RADIOPHARMACEUTICALS:  A total of 18.97 millicuries of Y- 90 microsphere administered. COMPARISON:  10/02/2016 FINDINGS: Y - 90 microspheres therapy as above. First therapy the right hepatic lobe. Bremsstrahlung planar and SPECT imaging of the abdomen following intrahepatic arterial delivery of Y-75microsphere demonstrates radioactivity localized to the right hepatic lobe. No evidence of extrahepatic activity. IMPRESSION: Successful Y - 90 microsphere delivery for treatment of unresectable liver metastasis. First therapy to the right lobe. Bremssstrahlung scan demonstrates activity localized to right hepatic lobe with no extrahepatic activity identified. Electronically Signed   By: Signa Kell M.D.   On: 12/13/2016 13:39   Ir US Guide Vasc Access Right  Result Date: 12/13/2016 INDICATION: History of metastatic neuroendocrine tumor. Patient returns today for left hepatic arteriogram and potential percutaneous coil embolization of a suspected falciform artery as well as replaced right hepatic arteriogram and radioembolization of the right lobe of the liver. EXAM: 1. CELIAC ARTERIOGRAM 2. SELECTIVE LEFT HEPATIC ARTERIOGRAM WITH THE ACQUISITION OF DYNA CT IMAGES 3. SUPERIOR MESENTERIC ARTERIOGRAM 4. SELECTIVE REPLACED RIGHT HEPATIC ARTERIOGRAM 5. TRANSCATHETER YTTRIUM-90 RADIOEMBOLIZATION OF THE REPLACED RIGHT HEPATIC ARTERY 6. FLUOROSCOPIC GUIDED  ADMINISTRATION OF SIR-SPHERES 7. ULTRASOUND GUIDED VASCULAR ACCESS OF THE RIGHT COMMON FEMORAL ARTERY COMPARISON:  Mapping pre Y 90 radioembolization - 12/13/2016; CT of the abdomen pelvis - 11/13/2016 MEDICATIONS: Protonix 40 mg IV; Decadron 4 mg IV; Toradol 30 mg IV; Sandostatin 200 mg IV CONTRAST:  125 cc Isovue-300 ANESTHESIA/SEDATION: Moderate (conscious) sedation was employed during this procedure. A total of Versed 2 mg and Fentanyl 100 mcg was administered intravenously. Moderate Sedation Time: 143 minutes. The patient's level of consciousness and vital signs were monitored continuously by radiology nursing throughout the procedure under my direct supervision. FLUOROSCOPY TIME:  12 minutes 12 seconds (3,316 mGy). ACCESS: Right common femoral artery; hemostasis achieved with manual compression. COMPLICATIONS: SIR LEVEL B - Normal therapy, includes overnight admission for observation. Patient develop chest and midline back pain during the radioembolization of the right lobe of the liver and the complete dose of SIR spheres was unable to be administered. Note, postprocedural ECG was negative for evidence of ischemia and post procedural labs were negative for a troponin elevation. TECHNIQUE: Informed written consent was obtained from the patient after a discussion of the risks, benefits and alternatives to treatment. Questions regarding the procedure were encouraged and answered. A timeout was performed prior to the initiation of the procedure. The right groin was prepped and draped in the usual sterile fashion, and a sterile drape was applied covering the operative field. Maximum barrier sterile technique with sterile gowns and gloves were used for the procedure. A timeout was performed prior to the initiation of the procedure. Local anesthesia was provided with 1% lidocaine. The right femoral head was marked fluoroscopically. Under direct ultrasound guidance, the right common femoral artery was accessed with a  micropuncture kit allowing placement of a of 5-French vascular sheath. An ultrasound image was saved for documentation purposes. A limited arteriogram performed through the side arm of the sheath confirming appropriate access within the right common femoral artery. Over a Teena Dunk wire, a Mickelson catheter was advanced  to the level of the inferior thoracic aorta where it was reformed, back bled and flushed. The Mickelson catheter was utilized to select the celiac artery and a celiac arteriogram was performed. With the use of a Fathom 14 micro wire, a regular Renegade microcatheter was advanced into the left hepatic artery and multiple selective left hepatic arteriograms were performed in various obliquities including the acquisition of Dyna CT images during selective left hepatic arteriogram. Attention was now paid towards radioembolization of the replaced right hepatic artery. The Ann Held was then utilized select the superior mesenteric artery and a selective superior mesenteric arteriogram was performed. With the use of a Fathom 14 micro wire, a high-flow Renegade microcatheter was advanced into the distal aspect of the replaced right hepatic artery at the level of the vessel's bifurcation. Contrast injection confirmed appropriate positioning. Radioembolization was then performed with Yttrium-90 SIR Spheres. Particles were administered via a microcatheter utilizing a completely enclosed system. Monitoring of antegrade flow was performed during and after the administration under fluoroscopy with use of contrast intermittently. After the initial dose of the radioembolization, the patient complained of the sudden onset of chest and midline back pain. This is associated with elevation of the patient's blood pressure. Contrast injection continue to demonstrate preserved antegrade flow, however the patient's symptoms persisted. As the patient's symptoms persisted despite temporary halting of the procedure, the decision  was made to abandon the procedure prior to the full administration of the radioembolization. Next, the microcatheter, outer catheter and administration system were then discarded into radiation safety receptacle. At this point, the procedure was terminated. The right common femoral approach vascular sheath was removed and hemostasis was achieved with manual compression. A dressing was placed. Postprocedural EKG was negative for evidence of ischemia in subsequent troponin levels were negative. All participants involved with the procedure were surveyed by the radiation safety officer prior to exiting the fluoroscopy suite. The patient was escorted to the nuclear medicine department for post-treatment imaging. 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 Multiple selective left hepatic arteriograms appear to demonstrate a potential falciform artery which could serve as the source of the non target MAA demonstrated on preceding nuclear medicine planning study. Dyna CT images failed to delineate the origin of this vessel however images are degraded secondary to patient body habitus. As the atretic vessel was attempted to be cannulated, the vessel apparently spasm and was no longer visualized. As such, percutaneous coil embolization of the presumed falciform artery was not performed. 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 Again, there are no definitive abnormal vessels arising from the replaced SMA via the intestines. Successful administration of a partial dose of SIR spheres from the distal aspect of the right SMA the level the vessel's bifurcation. Intermittent contrast injections during and after the radioembolization confirming preserved antegrade flow without reflux. As above, the complete calculated dose of SIR spheres was  unable to be administered secondary to patient's development of unexplained chest and back pain shortly after the beginning of the radioembolization. IMPRESSION: Technically successful radioembolization of the replaced right hepatic artery with Yttrium-90 microspheres. PLAN: - Patient will be seen in the interventional radiology clinic in 3-4 weeks with the acquisition of LFTs. - As the patient tolerated today's procedure poorly, I am uncertain whether we will be able to proceed with completion radioembolization of the left lobe of the liver. Electronically Signed   By:  Sandi Mariscal M.D.   On: 12/13/2016 17:27   Dg Chest Port 1 View  Result Date: 12/13/2016 CLINICAL DATA:  Metastatic neuroendocrine cancer EXAM: PORTABLE CHEST 1 VIEW COMPARISON:  None. FINDINGS: Loop recorder device projects over the left chest. Heart is borderline in size. Mild vascular congestion. No confluent opacities or effusions. No acute bony abnormality. IMPRESSION: Borderline heart size.  Mild vascular congestion. Electronically Signed   By: Rolm Baptise M.D.   On: 12/13/2016 14:29   Ir Embo Tumor Organ Ischemia Infarct Inc Guide Roadmapping  Result Date: 12/13/2016 INDICATION: History of metastatic neuroendocrine tumor. Patient returns today for left hepatic arteriogram and potential percutaneous coil embolization of a suspected falciform artery as well as replaced right hepatic arteriogram and radioembolization of the right lobe of the liver. EXAM: 1. CELIAC ARTERIOGRAM 2. SELECTIVE LEFT HEPATIC ARTERIOGRAM WITH THE ACQUISITION OF DYNA CT IMAGES 3. SUPERIOR MESENTERIC ARTERIOGRAM 4. SELECTIVE REPLACED RIGHT HEPATIC ARTERIOGRAM 5. TRANSCATHETER YTTRIUM-90 RADIOEMBOLIZATION OF THE REPLACED RIGHT HEPATIC ARTERY 6. FLUOROSCOPIC GUIDED ADMINISTRATION OF SIR-SPHERES 7. ULTRASOUND GUIDED VASCULAR ACCESS OF THE RIGHT COMMON FEMORAL ARTERY COMPARISON:  Mapping pre Y 90 radioembolization - 12/13/2016; CT of the abdomen pelvis - 11/13/2016  MEDICATIONS: Protonix 40 mg IV; Decadron 4 mg IV; Toradol 30 mg IV; Sandostatin 200 mg IV CONTRAST:  125 cc Isovue-300 ANESTHESIA/SEDATION: Moderate (conscious) sedation was employed during this procedure. A total of Versed 2 mg and Fentanyl 100 mcg was administered intravenously. Moderate Sedation Time: 143 minutes. The patient's level of consciousness and vital signs were monitored continuously by radiology nursing throughout the procedure under my direct supervision. FLUOROSCOPY TIME:  12 minutes 12 seconds (3,316 mGy). ACCESS: Right common femoral artery; hemostasis achieved with manual compression. COMPLICATIONS: SIR LEVEL B - Normal therapy, includes overnight admission for observation. Patient develop chest and midline back pain during the radioembolization of the right lobe of the liver and the complete dose of SIR spheres was unable to be administered. Note, postprocedural ECG was negative for evidence of ischemia and post procedural labs were negative for a troponin elevation. TECHNIQUE: Informed written consent was obtained from the patient after a discussion of the risks, benefits and alternatives to treatment. Questions regarding the procedure were encouraged and answered. A timeout was performed prior to the initiation of the procedure. The right groin was prepped and draped in the usual sterile fashion, and a sterile drape was applied covering the operative field. Maximum barrier sterile technique with sterile gowns and gloves were used for the procedure. A timeout was performed prior to the initiation of the procedure. Local anesthesia was provided with 1% lidocaine. The right femoral head was marked fluoroscopically. Under direct ultrasound guidance, the right common femoral artery was accessed with a micropuncture kit allowing placement of a of 5-French vascular sheath. An ultrasound image was saved for documentation purposes. A limited arteriogram performed through the side arm of the sheath  confirming appropriate access within the right common femoral artery. Over a Britta Mccreedy wire, a Mickelson catheter was advanced to the level of the inferior thoracic aorta where it was reformed, back bled and flushed. The Mickelson catheter was utilized to select the celiac artery and a celiac arteriogram was performed. With the use of a Fathom 14 micro wire, a regular Renegade microcatheter was advanced into the left hepatic artery and multiple selective left hepatic arteriograms were performed in various obliquities including the acquisition of Dyna CT images during selective left hepatic arteriogram. Attention was now paid towards radioembolization of the replaced  right hepatic artery. The Ann Held was then utilized select the superior mesenteric artery and a selective superior mesenteric arteriogram was performed. With the use of a Fathom 14 micro wire, a high-flow Renegade microcatheter was advanced into the distal aspect of the replaced right hepatic artery at the level of the vessel's bifurcation. Contrast injection confirmed appropriate positioning. Radioembolization was then performed with Yttrium-90 SIR Spheres. Particles were administered via a microcatheter utilizing a completely enclosed system. Monitoring of antegrade flow was performed during and after the administration under fluoroscopy with use of contrast intermittently. After the initial dose of the radioembolization, the patient complained of the sudden onset of chest and midline back pain. This is associated with elevation of the patient's blood pressure. Contrast injection continue to demonstrate preserved antegrade flow, however the patient's symptoms persisted. As the patient's symptoms persisted despite temporary halting of the procedure, the decision was made to abandon the procedure prior to the full administration of the radioembolization. Next, the microcatheter, outer catheter and administration system were then discarded into radiation  safety receptacle. At this point, the procedure was terminated. The right common femoral approach vascular sheath was removed and hemostasis was achieved with manual compression. A dressing was placed. Postprocedural EKG was negative for evidence of ischemia in subsequent troponin levels were negative. All participants involved with the procedure were surveyed by the radiation safety officer prior to exiting the fluoroscopy suite. The patient was escorted to the nuclear medicine department for post-treatment imaging. 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 Multiple selective left hepatic arteriograms appear to demonstrate a potential falciform artery which could serve as the source of the non target MAA demonstrated on preceding nuclear medicine planning study. Dyna CT images failed to delineate the origin of this vessel however images are degraded secondary to patient body habitus. As the atretic vessel was attempted to be cannulated, the vessel apparently spasm and was no longer visualized. As such, percutaneous coil embolization of the presumed falciform artery was not performed. 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 Again, there are no definitive abnormal vessels arising from the replaced SMA via the intestines. Successful administration of a partial dose of SIR spheres from the distal aspect of the right SMA the level the vessel's bifurcation. Intermittent contrast injections during and after the radioembolization confirming preserved antegrade flow without reflux. As above, the complete calculated dose of SIR spheres was unable to be administered secondary to patient's development of unexplained chest and back pain shortly after the beginning of the radioembolization. IMPRESSION: Technically successful  radioembolization of the replaced right hepatic artery with Yttrium-90 microspheres. PLAN: - Patient will be seen in the interventional radiology clinic in 3-4 weeks with the acquisition of LFTs. - As the patient tolerated today's procedure poorly, I am uncertain whether we will be able to proceed with completion radioembolization of the left lobe of the liver. Electronically Signed   By: Sandi Mariscal M.D.   On: 12/13/2016 17:27   Nm Radio Pharm Therapy Intraarterial  Result Date: 12/13/2016 CLINICAL DATA:  Metastatic malignant neuroendocrine tumor to the liver. EXAM: NUCLEAR MEDICINE SPECIAL MED RAD PHYSICS CONS; NUCLEAR MEDICINE RADIO PHARM THERAPY INTRA ARTERIAL; NUCLEAR MEDICINE TREATMENT PROCEDURE; NUCLEAR MEDICINE LIVER SCAN TECHNIQUE: In conjunction with the interventional radiologist a Y- Microsphere dose was calculated utilizing body surface area formulation. Calculated dose equal 36.99 mCi. Pre therapy MAA liver SPECT scan and CTA  were evaluated. Utilizing a microcatheter system, the hepatic artery was selected and Y-90 microspheres were delivered in fractionated aliquots. Radiopharmaceutical was delivered by the interventional radiologist and nuclear radiologist. [name] through the infusion patient reported back pain and the patient's blood pressure was noted to be increased and the procedure was then terminated. The patient tolerated procedure well. No adverse effects were noted. Bremsstrahlung planar and SPECT imaging of the abdomen following intrahepatic arterial delivery of Y-90 microsphere was performed. RADIOPHARMACEUTICALS:  A total of 16.10 millicuries of Y- 90 microsphere administered. COMPARISON:  10/02/2016 FINDINGS: Y - 90 microspheres therapy as above. First therapy the right hepatic lobe. Bremsstrahlung planar and SPECT imaging of the abdomen following intrahepatic arterial delivery of Y-52mcrosphere demonstrates radioactivity localized to the right hepatic lobe. No evidence of  extrahepatic activity. IMPRESSION: Successful Y - 90 microsphere delivery for treatment of unresectable liver metastasis. First therapy to the right lobe. Bremssstrahlung scan demonstrates activity localized to right hepatic lobe with no extrahepatic activity identified. Electronically Signed   By: TKerby MoorsM.D.   On: 12/13/2016 13:39    Labs:  CBC:  Recent Labs  11/28/16 0743 12/05/16 1049 12/13/16 0801 12/14/16 0420  WBC 6.0 3.6* 4.1 10.9*  HGB 13.1 12.5 13.2 13.6  HCT 39.6 38.4 40.8 40.3  PLT 189 156 175 169    COAGS:  Recent Labs  08/20/16 1244 11/28/16 0743  INR 1.07 1.04  APTT 27  --     BMP:  Recent Labs  11/28/16 0743 12/05/16 1049 12/13/16 0801 12/14/16 0420 12/28/16 1516  NA 137 139 136 131* 138  K 4.5 4.5 4.3 4.6 5.2  CL 102  --  101 98* 100  CO2 '27 25 27 24 23  '$ GLUCOSE 110* 122 100* 149* 102*  BUN 12 12.'9 15 15 10  '$ CALCIUM 9.8 9.8 9.9 9.4 10.0  CREATININE 0.90 0.9 0.95 0.95 0.85  GFRNONAA 59*  --  55* 55* 65  GFRAA >60  --  >60 >60 75    LIVER FUNCTION TESTS:  Recent Labs  11/28/16 0743 12/05/16 1049 12/13/16 0801 12/28/16 1516  BILITOT 0.4 0.50 0.8 0.3  AST '26 17 26 22  '$ ALT '17 14 15 14  '$ ALKPHOS 81 78 72 86  PROT 7.6 6.7 7.6 6.6  ALBUMIN 4.0 3.6 4.1 3.8    TUMOR MARKERS:  Recent Labs  12/13/16 0801  CHROMGRNA 15*    Assessment and Plan:  Rebekah WOFFORDis a 83y.o. female with complex past medical history significant for bradycardia, paroxysmal atrial fibrillation, chronic pericarditis, CVA and cerebellar degeneration (patient is wheelchair bound and requires assistance for transferring), hypertension and hyperlipidemia, who has recently been diagnosed with metastatic neuroendocrine tumor of the lung following ultrasound-guided liver lesion biopsy on 08/20/2016.  Patiently initially started on treatment with alternating two-week cycles of Xeloda and Temodar and while she has been tolerating the treatment well, CT scan of  the abdomen and pelvis performed for the evaluation of right groin pain demonstrates progression of known hepatic metastatic disease and as such, underwent technically successful mapping Y 90 radioembolization on 11/28/2016 and subsequent Y 90 radioembolization of the right lobe of the liver on 12/13/2016.  Despite the patient tolerating the Y 90 radial embolization poorly with approximate 5 days of post procedural nonspecific abdominal pain, she has subsequently recovered completely from the procedure. Post procedural LFTs remain normal.  I explained that her mapping procedure did demonstrate nontarget activity, likely from a falciform ligament arising from the left hepatic  arterial system, however this vessel is not amenable to percutaneous coil embolization. I explained that if we decide to perform completion Y 90 embolization of the left lobe of the liver, I would augment procedure with placement of an ice pack over the upper abdomen in hopes this would result in the vessel's spasm.  I explained that if nontarget embolization occurred, it could result in ventral abdominal wall pain and potentially skin ulceration.   I explained that I am still uncertain as to the cause of the patient's atypical chest and abdominal pain and that I am uncertain whether it would recur with completion Y-90 of the left lobe of the liver.  Risks and Benefits of completion Y-90 embolization discussed with the patient including, but not limited to bleeding, infection, vascular injury, non-target embolization, worsening hepatic function and / or contrast induced renal failure.  She understands all of the benefits and risks and is more concerned that continued observation could result in disease progression.    Following this prolonged and detailed conversation, the patient wishes to pursue with completion Y 90 radioembolization of the left lobe of the liver.  As such, we will proceed with scheduled completion Y 90  radioembolization of the left lobe of the liver next Wednesday, June 13, as previously scheduled.  A copy of this report was sent to the requesting provider on this date.  Electronically Signed: Sandi Mariscal 01/02/2017, 3:52 PM   I spent a total of 25 Minutes in face to face in clinical consultation, greater than 50% of which was counseling/coordinating care for post Y 90 radioembolization

## 2017-01-07 ENCOUNTER — Other Ambulatory Visit: Payer: Self-pay | Admitting: Radiology

## 2017-01-08 ENCOUNTER — Other Ambulatory Visit: Payer: Self-pay | Admitting: Radiology

## 2017-01-08 MED ORDER — SODIUM CHLORIDE 0.9 % IV SOLN: 200.0000 ug/h | Freq: Once | INTRAVENOUS | Status: DC

## 2017-01-09 ENCOUNTER — Ambulatory Visit (HOSPITAL_COMMUNITY)
Admission: RE | Admit: 2017-01-09 | Discharge: 2017-01-09 | Disposition: A | Discharge status: Home / Self Care | Payer: Medicare Other | Source: Ambulatory Visit | Attending: Interventional Radiology | Admitting: Interventional Radiology

## 2017-01-09 ENCOUNTER — Encounter (HOSPITAL_COMMUNITY): Payer: Self-pay

## 2017-01-09 ENCOUNTER — Other Ambulatory Visit (HOSPITAL_COMMUNITY): Payer: Self-pay | Admitting: Interventional Radiology

## 2017-01-09 ENCOUNTER — Encounter (HOSPITAL_COMMUNITY)
Admission: RE | Admit: 2017-01-09 | Discharge: 2017-01-09 | Disposition: A | Discharge status: Home / Self Care | Payer: Medicare Other | Source: Ambulatory Visit | Attending: Interventional Radiology | Admitting: Interventional Radiology

## 2017-01-09 DIAGNOSIS — K589 Irritable bowel syndrome without diarrhea: Secondary | ICD-10-CM | POA: Insufficient documentation

## 2017-01-09 DIAGNOSIS — Z923 Personal history of irradiation: Secondary | ICD-10-CM | POA: Diagnosis not present

## 2017-01-09 DIAGNOSIS — G40909 Epilepsy, unspecified, not intractable, without status epilepticus: Secondary | ICD-10-CM

## 2017-01-09 DIAGNOSIS — I10 Essential (primary) hypertension: Secondary | ICD-10-CM | POA: Insufficient documentation

## 2017-01-09 DIAGNOSIS — Z993 Dependence on wheelchair: Secondary | ICD-10-CM | POA: Insufficient documentation

## 2017-01-09 DIAGNOSIS — G319 Degenerative disease of nervous system, unspecified: Secondary | ICD-10-CM | POA: Insufficient documentation

## 2017-01-09 DIAGNOSIS — I480 Paroxysmal atrial fibrillation: Secondary | ICD-10-CM | POA: Insufficient documentation

## 2017-01-09 DIAGNOSIS — C7B02 Secondary carcinoid tumors of liver: Secondary | ICD-10-CM | POA: Insufficient documentation

## 2017-01-09 DIAGNOSIS — C7A8 Other malignant neuroendocrine tumors: Secondary | ICD-10-CM | POA: Insufficient documentation

## 2017-01-09 DIAGNOSIS — C7B8 Other secondary neuroendocrine tumors: Secondary | ICD-10-CM

## 2017-01-09 DIAGNOSIS — E7800 Pure hypercholesterolemia, unspecified: Secondary | ICD-10-CM | POA: Insufficient documentation

## 2017-01-09 DIAGNOSIS — M1990 Unspecified osteoarthritis, unspecified site: Secondary | ICD-10-CM

## 2017-01-09 DIAGNOSIS — Z8673 Personal history of transient ischemic attack (TIA), and cerebral infarction without residual deficits: Secondary | ICD-10-CM

## 2017-01-09 DIAGNOSIS — Z7951 Long term (current) use of inhaled steroids: Secondary | ICD-10-CM

## 2017-01-09 DIAGNOSIS — E669 Obesity, unspecified: Secondary | ICD-10-CM

## 2017-01-09 DIAGNOSIS — F419 Anxiety disorder, unspecified: Secondary | ICD-10-CM | POA: Insufficient documentation

## 2017-01-09 DIAGNOSIS — Z87440 Personal history of urinary (tract) infections: Secondary | ICD-10-CM

## 2017-01-09 DIAGNOSIS — K219 Gastro-esophageal reflux disease without esophagitis: Secondary | ICD-10-CM

## 2017-01-09 DIAGNOSIS — F329 Major depressive disorder, single episode, unspecified: Secondary | ICD-10-CM

## 2017-01-09 DIAGNOSIS — I319 Disease of pericardium, unspecified: Secondary | ICD-10-CM

## 2017-01-09 DIAGNOSIS — C787 Secondary malignant neoplasm of liver and intrahepatic bile duct: Secondary | ICD-10-CM | POA: Insufficient documentation

## 2017-01-09 DIAGNOSIS — D449 Neoplasm of uncertain behavior of unspecified endocrine gland: Secondary | ICD-10-CM | POA: Diagnosis not present

## 2017-01-09 DIAGNOSIS — E039 Hypothyroidism, unspecified: Secondary | ICD-10-CM

## 2017-01-09 HISTORY — PX: IR ANGIOGRAM VISCERAL SELECTIVE: IMG657

## 2017-01-09 HISTORY — PX: IR ANGIOGRAM SELECTIVE EACH ADDITIONAL VESSEL: IMG667

## 2017-01-09 HISTORY — PX: IR US GUIDE VASC ACCESS RIGHT: IMG2390

## 2017-01-09 HISTORY — PX: IR EMBO TUMOR ORGAN ISCHEMIA INFARCT INC GUIDE ROADMAPPING: IMG5449

## 2017-01-09 LAB — COMPREHENSIVE METABOLIC PANEL
ALK PHOS: 87 U/L (ref 38–126)
ALT: 21 U/L (ref 14–54)
ANION GAP: 8 (ref 5–15)
AST: 34 U/L (ref 15–41)
Albumin: 3.7 g/dL (ref 3.5–5.0)
BUN: 9 mg/dL (ref 6–20)
CALCIUM: 9.9 mg/dL (ref 8.9–10.3)
CO2: 25 mmol/L (ref 22–32)
Chloride: 104 mmol/L (ref 101–111)
Creatinine, Ser: 0.86 mg/dL (ref 0.44–1.00)
GFR calc non Af Amer: >60 mL/min (ref >60)
Glucose, Bld: 109 mg/dL — ABNORMAL HIGH (ref 65–99)
POTASSIUM: 4.3 mmol/L (ref 3.5–5.1)
SODIUM: 137 mmol/L (ref 135–145)
TOTAL PROTEIN: 7.1 g/dL (ref 6.5–8.1)
Total Bilirubin: 0.7 mg/dL (ref 0.3–1.2)

## 2017-01-09 LAB — CBC WITH DIFFERENTIAL/PLATELET
BASOS PCT: 1 %
Basophils Absolute: 0 10*3/uL (ref 0.0–0.1)
EOS ABS: 0.2 10*3/uL (ref 0.0–0.7)
EOS PCT: 6 %
HCT: 38.8 % (ref 36.0–46.0)
HEMOGLOBIN: 12.9 g/dL (ref 12.0–15.0)
LYMPHS ABS: 1 10*3/uL (ref 0.7–4.0)
Lymphocytes Relative: 28 %
MCH: 31.5 pg (ref 26.0–34.0)
MCHC: 33.2 g/dL (ref 30.0–36.0)
MCV: 94.9 fL (ref 78.0–100.0)
MONO ABS: 0.6 10*3/uL (ref 0.1–1.0)
MONOS PCT: 15 %
NEUTROS PCT: 50 %
Neutro Abs: 1.9 10*3/uL (ref 1.7–7.7)
Platelets: 164 10*3/uL (ref 150–400)
RBC: 4.09 MIL/uL (ref 3.87–5.11)
RDW: 17.6 % — AB (ref 11.5–15.5)
WBC: 3.7 10*3/uL — ABNORMAL LOW (ref 4.0–10.5)

## 2017-01-09 LAB — PROTIME-INR
INR: 0.98
Prothrombin Time: 13 seconds (ref 11.4–15.2)

## 2017-01-09 MED ORDER — SODIUM CHLORIDE 0.9 % IV SOLN
200.0000 ug/h | Freq: Once | INTRAVENOUS | Status: AC
  Administered 2017-01-09: 200 ug/h via INTRAVENOUS
  Filled 2017-01-09: qty 1

## 2017-01-09 MED ORDER — PIPERACILLIN-TAZOBACTAM 3.375 G IVPB
INTRAVENOUS | Status: AC
  Administered 2017-01-09: 3.375 g via INTRAVENOUS
  Filled 2017-01-09: qty 50

## 2017-01-09 MED ORDER — FENTANYL CITRATE (PF) 100 MCG/2ML IJ SOLN
INTRAMUSCULAR | Status: AC
  Filled 2017-01-09: qty 4

## 2017-01-09 MED ORDER — KETOROLAC TROMETHAMINE 30 MG/ML IJ SOLN
15.0000 mg | INTRAMUSCULAR | Status: AC
  Administered 2017-01-09: 15 mg via INTRAVENOUS
  Filled 2017-01-09: qty 1

## 2017-01-09 MED ORDER — PANTOPRAZOLE SODIUM 40 MG IV SOLR
40.0000 mg | Freq: Once | INTRAVENOUS | Status: AC
  Administered 2017-01-09: 40 mg via INTRAVENOUS
  Filled 2017-01-09: qty 40

## 2017-01-09 MED ORDER — ONDANSETRON HCL 4 MG/2ML IJ SOLN
4.0000 mg | Freq: Once | INTRAMUSCULAR | Status: AC
  Administered 2017-01-09: 4 mg via INTRAVENOUS
  Filled 2017-01-09: qty 2

## 2017-01-09 MED ORDER — PIPERACILLIN-TAZOBACTAM 3.375 G IVPB
3.3750 g | Freq: Once | INTRAVENOUS | Status: AC
  Administered 2017-01-09: 3.375 g via INTRAVENOUS

## 2017-01-09 MED ORDER — FENTANYL CITRATE (PF) 100 MCG/2ML IJ SOLN
25.0000 ug | INTRAMUSCULAR | Status: AC
  Administered 2017-01-09: 25 ug via INTRAVENOUS
  Filled 2017-01-09: qty 2

## 2017-01-09 MED ORDER — FENTANYL CITRATE (PF) 100 MCG/2ML IJ SOLN
INTRAMUSCULAR | Status: AC | PRN
  Administered 2017-01-09 (×2): 50 ug via INTRAVENOUS

## 2017-01-09 MED ORDER — YTTRIUM 90 INJECTION
16.5600 | INJECTION | Freq: Once | INTRAVENOUS | Status: AC
  Administered 2017-01-09: 16.56 via INTRA_ARTERIAL

## 2017-01-09 MED ORDER — LIDOCAINE HCL 1 % IJ SOLN
INTRAMUSCULAR | Status: AC
  Filled 2017-01-09: qty 20

## 2017-01-09 MED ORDER — SODIUM CHLORIDE 0.9 % IV SOLN
INTRAVENOUS | Status: DC
  Administered 2017-01-09: 08:00:00 via INTRAVENOUS

## 2017-01-09 MED ORDER — LIDOCAINE HCL 1 % IJ SOLN
INTRAMUSCULAR | Status: AC | PRN
  Administered 2017-01-09: 5 mL

## 2017-01-09 MED ORDER — IOPAMIDOL (ISOVUE-300) INJECTION 61%
INTRAVENOUS | Status: AC
  Administered 2017-01-09: 65 mL via INTRAVENOUS
  Filled 2017-01-09: qty 300

## 2017-01-09 MED ORDER — MIDAZOLAM HCL 2 MG/2ML IJ SOLN
INTRAMUSCULAR | Status: AC
  Filled 2017-01-09: qty 6

## 2017-01-09 MED ORDER — MIDAZOLAM HCL 2 MG/2ML IJ SOLN
INTRAMUSCULAR | Status: AC | PRN
  Administered 2017-01-09 (×2): 1 mg via INTRAVENOUS

## 2017-01-09 MED ORDER — DEXAMETHASONE SODIUM PHOSPHATE 10 MG/ML IJ SOLN
4.0000 mg | Freq: Once | INTRAMUSCULAR | Status: AC
  Administered 2017-01-09: 4 mg via INTRAVENOUS
  Filled 2017-01-09: qty 1

## 2017-01-09 MED ORDER — IOPAMIDOL (ISOVUE-300) INJECTION 61%
100.0000 mL | Freq: Once | INTRAVENOUS | Status: AC | PRN
  Administered 2017-01-09: 65 mL via INTRAVENOUS

## 2017-01-09 NOTE — Procedures (Signed)
Pre-procedure Diagnosis: Metastatic neuroendocrine tumor Post-procedure Diagnosis: Same  Post Y90 embolization of the left lobe of the liver.    Complications: None Immediate  EBL: None  Keep right leg straight for 4 hrs.    SignedSandi Mariscal Pager: [phone] 01/09/2017, 11:20 AM

## 2017-01-09 NOTE — Sedation Documentation (Signed)
Finished in nuc. med transfering to short stay,

## 2017-01-09 NOTE — Discharge Instructions (Signed)
Post Y-90 Radioembolization Discharge Instructions  You have been given a radioactive material during your procedure.  While it is safe for you to be discharged home from the hospital, you need to proceed directly home.    Do not use public transportation, including air travel, lasting more than 2 hours for 1 week.  Avoid crowded public places for 1 week.  Adult visitors should try to avoid close contact with you for 1 week.    Children and pregnant females should not visit or have close contact with you for 1 week.  Items that you touch are not radioactive.  Do not sleep in the same bed as your partner for 1 week, and a condom should be used for sexual activity during the first 24 hours.  Your blood may be radioactive and caution should be used if any bleeding occurs during the recovery period.  Body fluids may be radioactive for 24 hours.  Wash your hands after voiding.  Men should sit to urinate.  Dispose of any soiled materials (flush down toilet or place in trash at home) during the first day.  Drink 6 to 8 glasses of fluids per day for 5 days to hydrate yourself.  If you need to see a doctor during the first week, you must let them know that you were treated with yttrium-90 microspheres, and will be slightly radioactive.  They can call Interventional Radiology [phone] with any questions.Moderate Conscious Sedation, Adult, Care After These instructions provide you with information about caring for yourself after your procedure. Your health care provider may also give you more specific instructions. Your treatment has been planned according to current medical practices, but problems sometimes occur. Call your health care provider if you have any problems or questions after your procedure. What can I expect after the procedure? After your procedure, it is common:  To feel sleepy for several hours.  To feel clumsy and have poor balance for several hours.  To have poor judgment for  several hours.  To vomit if you eat too soon.  Follow these instructions at home: For at least 24 hours after the procedure:   Do not: ? Participate in activities where you could fall or become injured. ? Drive. ? Use heavy machinery. ? Drink alcohol. ? Take sleeping pills or medicines that cause drowsiness. ? Make important decisions or sign legal documents. ? Take care of children on your own.  Rest. Eating and drinking  Follow the diet recommended by your health care provider.  If you vomit: ? Drink water, juice, or soup when you can drink without vomiting. ? Make sure you have little or no nausea before eating solid foods. General instructions  Have a responsible adult stay with you until you are awake and alert.  Take over-the-counter and prescription medicines only as told by your health care provider.  If you smoke, do not smoke without supervision.  Keep all follow-up visits as told by your health care provider. This is important. Contact a health care provider if:  You keep feeling nauseous or you keep vomiting.  You feel light-headed.  You develop a rash.  You have a fever. Get help right away if:  You have trouble breathing. This information is not intended to replace advice given to you by your health care provider. Make sure you discuss any questions you have with your health care provider. Document Released: 05/06/2013 Document Revised: 12/19/2015 Document Reviewed: 11/05/2015 Elsevier Interactive Patient Education  [employer].

## 2017-01-09 NOTE — H&P (Signed)
Referring Physician(s): Mohamed,M  Supervising Physician: Sandi Mariscal  Patient Status:  [facility]  Chief Complaint: Metastatic neuroendocrine carcinoma to liver   Subjective: [name] a 83 y.o.femalewith complex past medical history significant for bradycardia, paroxysmal atrial fibrillation, chronic pericarditis, CVA andcerebellar degeneration (patient is wheelchair bound and requires assistance for transferring), hypertension and hyperlipidemia, who has recently been diagnosed with metastatic neuroendocrine tumor of the lung following ultrasound-guided liver lesion biopsy on 08/20/2016. She initially started on treatment with alternating two-week cycles of Xeloda and Temodar and while she has been tolerating the treatment well, CT scan of the abdomen and pelvis performed for the evaluation of right groin pain demonstrates progression of known hepatic metastatic disease and as such, patient was referred for evaluation of candidacy for hepatic directed therapy at the request of Dr. Earlie Server and underwent technically successful mapping Y 90 radioembolization on 11/28/2016 and subsequent Y 90 radioembolization of the right lobe of the liver on 12/13/2016.She presents again today for Y 90 radioembolization of the left hepatic lobe.She currently denies fever, chest pain, dyspnea, cough, worsening abdominal pain, back pain, nausea, vomiting or abnormal bleeding. She does have occasional left retro-orbital headaches/visual blurriness(not new) and some abdominal bloating. Past Medical History:  Diagnosis Date  . Acute respiratory failure with hypoxia ([place]) 10/23/2015  . Allergic rhinitis    PT. DENIES  . Anxiety   . Arthritis    NECK  . Ataxia   . Bradycardia    primarily nocturnal  . Burning tongue syndrome 25 years  . Cancer ([place])   . Cataract   . Cerebellar degeneration   . Chronic pericarditis   . Chronic urinary tract infection   . Complication of anesthesia    low o2 sats,  coded 30 years ago  . CVA (cerebral infarction) 05/2003  . Depression   . Encounter for antineoplastic chemotherapy 10/09/2016  . Gait disorder   . Gastric polyps   . GERD (gastroesophageal reflux disease)   . Goals of care, counseling/discussion 10/09/2016  . High cholesterol   . Hyperlipidemia   . Hypertension   . Hypertension   . Hypotension   . Hypothyroidism   . IBS (irritable bowel syndrome)   . Obesity   . Paroxysmal atrial fibrillation ([name])    chads2vasc score is 6,  she is felt to be a poor candidate for anticoagulation  . Personal history of arterial venous malformation (AVM)    right side of face  . Seizure disorder ([place])   . Seizures ([place]) 2003   " smelling"- Gabapentin "no problem"  . Shortness of breath dyspnea    with exertion  . Sternum fx 10/27/2013  . Stroke [facility]) 5 years ago   Right side of face weak, slurred speach-   . Thyroid disease   . TIA (transient ischemic attack)   . UTI (lower urinary tract infection) 03/27/2016   "frequently"   Past Surgical History:  Procedure Laterality Date  . APPENDECTOMY  83 years old  . COLONOSCOPY  2006, 2009  . COLONOSCOPY WITH PROPOFOL N/A 03/28/2016   Procedure: COLONOSCOPY WITH PROPOFOL;  Surgeon: Gatha Mayer, MD;  Location: [place];  Service: Endoscopy;  Laterality: N/A;  . cyst removed  35 years ago  . EP IMPLANTABLE DEVICE N/A 01/06/2015   Procedure: Loop Recorder Insertion;  Surgeon: Thompson Grayer, MD;  Location: [facility];  Service: Cardiovascular;  Laterality: N/A;  . EYE SURGERY Right    Cataract  . [place]  ADDITIONAL VESSEL  11/28/2016  . IR ANGIOGRAM SELECTIVE EACH ADDITIONAL VESSEL  11/28/2016  . IR ANGIOGRAM SELECTIVE EACH ADDITIONAL VESSEL  11/28/2016  . IR ANGIOGRAM SELECTIVE EACH ADDITIONAL VESSEL  11/28/2016  . IR ANGIOGRAM SELECTIVE EACH ADDITIONAL VESSEL  12/13/2016  . IR ANGIOGRAM SELECTIVE EACH ADDITIONAL VESSEL  12/13/2016  . IR ANGIOGRAM VISCERAL SELECTIVE  11/28/2016    . IR ANGIOGRAM VISCERAL SELECTIVE  11/28/2016  . IR ANGIOGRAM VISCERAL SELECTIVE  12/13/2016  . IR ANGIOGRAM VISCERAL SELECTIVE  12/13/2016  . IR EMBO ARTERIAL NOT HEMORR HEMANG INC GUIDE ROADMAPPING  11/28/2016  . IR EMBO TUMOR ORGAN ISCHEMIA INFARCT INC GUIDE ROADMAPPING  12/13/2016  . IR US GUIDE VASC ACCESS RIGHT  11/28/2016  . IR US GUIDE VASC ACCESS RIGHT  12/13/2016  . KNEE ARTHROSCOPY Right 11/14/2006  . KNEE ARTHROSCOPY Bilateral 5 and 6 years ago  . KNEE ARTHROSCOPY WITH LATERAL MENISECTOMY  07/03/2012   Procedure: KNEE ARTHROSCOPY WITH LATERAL MENISECTOMY;  Surgeon: Magnus Sinning, MD;  Location: [facility];  Service: Orthopedics;  Laterality: Left;  with Partial Lateral Menisectomy and Medial Menisectomy. Shaving of medial and lateral femoral condyles. Shaving of patella. Removal of a loose body  . tibial and fibular internal fixation Left   . TOTAL ABDOMINAL HYSTERECTOMY  83 years old  . UPPER GASTROINTESTINAL ENDOSCOPY  2009, 2013      Allergies: Lisinopril  Medications: Prior to Admission medications   Medication Sig Start Date End Date Taking? Authorizing Provider  acetaminophen (TYLENOL) 500 MG tablet Take 500 mg by mouth every 6 (six) hours as needed.   Yes [provider]  amitriptyline (ELAVIL) 25 MG tablet Take 1 tablet (25 mg total) by mouth at bedtime. 10/01/16  Yes Eustaquio Maize, MD  capecitabine (XELODA) 500 MG tablet Take 3 tablets (1,500 mg total) by mouth 2 (two) times daily after a meal. 12/05/16  Yes Curt Bears, MD  escitalopram (LEXAPRO) 20 MG tablet Take 1 tablet (20 mg total) by mouth at bedtime. 10/01/16  Yes Eustaquio Maize, MD  fluticasone (FLONASE) 50 MCG/ACT nasal spray Place 2 sprays into both nostrils at bedtime. 09/20/16  Yes Eustaquio Maize, MD  furosemide (LASIX) 40 MG tablet Take 40 mg by mouth daily as needed for edema.    Yes [provider]  gabapentin (NEURONTIN) 300 MG capsule Take 1-2 capsules (300-600 mg total) by mouth 4  (four) times daily. 08/23/16  Yes Eustaquio Maize, MD  hydroxypropyl methylcellulose / hypromellose (ISOPTO TEARS / GONIOVISC) 2.5 % ophthalmic solution Place 1 drop into both eyes at bedtime.   Yes [provider]  levothyroxine (SYNTHROID, LEVOTHROID) 75 MCG tablet TAKE 1 TABLET DAILY BEFORE BREAKFAST 12/25/16  Yes Eustaquio Maize, MD  losartan (COZAAR) 50 MG tablet TAKE 1 TABLET DAILY 07/17/16  Yes Evelina Dun A, FNP  lovastatin (MEVACOR) 40 MG tablet TAKE 1 TABLET AT BEDTIME 10/25/16  Yes Eustaquio Maize, MD  Melatonin 3 MG TBDP Take 3-6 mg by mouth at bedtime as needed. Patient taking differently: Take 3-6 mg by mouth at bedtime as needed (for sleep).  08/02/16  Yes Eustaquio Maize, MD  nitrofurantoin (MACRODANTIN) 50 MG capsule Take 50 mg by mouth at bedtime.   Yes [provider]  omeprazole (PRILOSEC) 40 MG capsule TAKE 1 CAPSULE DAILY 10/25/16  Yes Eustaquio Maize, MD  oxyCODONE-acetaminophen (PERCOCET) 5-325 MG tablet Take 1-2 tablets by mouth every 4 (four) hours as needed. 12/05/16  Yes Curt Bears, MD  temozolomide (TEMODAR) 140 MG capsule Take 2 capsules (280 mg total) by mouth daily. May take on an empty stomach or at bedtime to decrease nausea & vomiting. 12/05/16  Yes Curt Bears, MD  benzonatate (TESSALON) 100 MG capsule Take 1 capsule (100 mg total) by mouth 3 (three) times daily as needed for cough. Patient not taking: Reported on 01/02/2017 12/28/16   Eustaquio Maize, MD  lubiprostone (AMITIZA) 24 MCG capsule Take 1 capsule (24 mcg total) by mouth 2 (two) times daily with a meal. Patient not taking: Reported on 01/02/2017 12/21/16   Eustaquio Maize, MD  ondansetron (ZOFRAN) 8 MG tablet Take 1 tablet (8 mg total) by mouth every 8 (eight) hours as needed for nausea or vomiting. Patient not taking: Reported on 01/02/2017 10/09/16   Curt Bears, MD  Vitamin D, Ergocalciferol, (DRISDOL) 50000 units CAPS capsule Take 1 capsule (50,000 Units total) by mouth every  7 (seven) days. Patient taking differently: Take 50,000 Units by mouth every Friday.  08/23/16   Eustaquio Maize, MD     Vital Signs: BP 140/78   Pulse 78   Temp 98 F (36.7 C) (Oral)   Resp 18   SpO2 100%   Physical Exam Awake, alert. Chest clear to auscultation bilaterally.Heart with regular rate and rhythm; abdomen obese, soft, positive bowel sounds, nontender;lower extremities with some mild edema bilaterally.  Imaging: No results found.  Labs:  CBC:  Recent Labs  12/05/16 1049 12/13/16 0801 12/14/16 0420 01/09/17 0744  WBC 3.6* 4.1 10.9* 3.7*  HGB 12.5 13.2 13.6 12.9  HCT 38.4 40.8 40.3 38.8  PLT 156 175 169 164    COAGS:  Recent Labs  08/20/16 1244 11/28/16 0743 01/09/17 0744  INR 1.07 1.04 0.98  APTT 27  --   --     BMP:  Recent Labs  12/13/16 0801 12/14/16 0420 12/28/16 1516 01/09/17 0744  NA 136 131* 138 137  K 4.3 4.6 5.2 4.3  CL 101 98* 100 104  CO2 27 24 23 25   GLUCOSE 100* 149* 102* 109*  BUN 15 15 10 9   CALCIUM 9.9 9.4 10.0 9.9  CREATININE 0.95 0.95 0.85 0.86  GFRNONAA 55* 55* 65 >60  GFRAA >60 >60 75 >60    LIVER FUNCTION TESTS:  Recent Labs  12/05/16 1049 12/13/16 0801 12/28/16 1516 01/09/17 0744  BILITOT 0.50 0.8 0.3 0.7  AST 17 26 22  34  ALT 14 15 14 21   ALKPHOS 78 72 86 87  PROT 6.7 7.6 6.6 7.1  ALBUMIN 3.6 4.1 3.8 3.7    Assessment and Plan: Pt with history of metastatic intermediate grade neuroendocrine tumor of probable lung primary diagnosed in January 2018, presenting with small bilateral pulmonary nodules in addition to multiple liver metastases(also with panc lesion). She was deemed an appropriate candidate for Y 90 hepatic radioembolization and has undergone arterial mapping on 11/28/16. She underwent right hepatic lobe Y-90 radioembolization on 12/13/16 and presents again today for left hepatic lobe Y 90 radio embolization.Risks and benefits of Y-90 treatment were discussed with the patient and the patient's  husband including, but not limited to bleeding, infection, vascular injury, non-target embolization, worsening liver function and/or contrast induced renal failure.All of the patient's questions were answered, patient is agreeable to proceed.Consent signed and in chart.   Electronically Signed: D. Rowe Robert, PA-C 01/09/2017, 8:50 AM   I spent a total of 25 minutes at the the patient's bedside AND on the patient's hospital floor or unit, greater than  50% of which was counseling/coordinating care for left hepatic lobe Y 90 radioembolization

## 2017-01-09 NOTE — Sedation Documentation (Signed)
In nuckear med for scan. Pt  To be monitored per protoccol.

## 2017-01-09 NOTE — Progress Notes (Signed)
Multiple attempts made to void after removal of catheter, after 3 hours pt. Able to void, Dr. Pascal Lux notified.

## 2017-01-11 ENCOUNTER — Encounter: Payer: Self-pay | Admitting: Interventional Radiology

## 2017-01-13 ENCOUNTER — Emergency Department (HOSPITAL_COMMUNITY): Payer: Medicare Other

## 2017-01-13 ENCOUNTER — Emergency Department (HOSPITAL_COMMUNITY)
Admission: EM | Admit: 2017-01-13 | Discharge: 2017-01-14 | Disposition: A | Discharge status: Home / Self Care | Payer: Medicare Other | Attending: Emergency Medicine | Admitting: Emergency Medicine

## 2017-01-13 ENCOUNTER — Other Ambulatory Visit: Payer: Self-pay

## 2017-01-13 ENCOUNTER — Other Ambulatory Visit: Payer: Self-pay | Admitting: Family

## 2017-01-13 ENCOUNTER — Encounter (HOSPITAL_COMMUNITY): Payer: Self-pay | Admitting: Emergency Medicine

## 2017-01-13 DIAGNOSIS — D3A8 Other benign neuroendocrine tumors: Secondary | ICD-10-CM

## 2017-01-13 DIAGNOSIS — R109 Unspecified abdominal pain: Secondary | ICD-10-CM | POA: Diagnosis not present

## 2017-01-13 DIAGNOSIS — C7A8 Other malignant neuroendocrine tumors: Secondary | ICD-10-CM

## 2017-01-13 DIAGNOSIS — Z8673 Personal history of transient ischemic attack (TIA), and cerebral infarction without residual deficits: Secondary | ICD-10-CM | POA: Diagnosis not present

## 2017-01-13 DIAGNOSIS — Z87891 Personal history of nicotine dependence: Secondary | ICD-10-CM | POA: Insufficient documentation

## 2017-01-13 DIAGNOSIS — R079 Chest pain, unspecified: Secondary | ICD-10-CM | POA: Diagnosis not present

## 2017-01-13 DIAGNOSIS — C7B02 Secondary carcinoid tumors of liver: Secondary | ICD-10-CM | POA: Insufficient documentation

## 2017-01-13 DIAGNOSIS — Z5111 Encounter for antineoplastic chemotherapy: Secondary | ICD-10-CM

## 2017-01-13 DIAGNOSIS — I10 Essential (primary) hypertension: Secondary | ICD-10-CM | POA: Diagnosis not present

## 2017-01-13 DIAGNOSIS — R9431 Abnormal electrocardiogram [ECG] [EKG]: Secondary | ICD-10-CM | POA: Diagnosis not present

## 2017-01-13 DIAGNOSIS — Z79899 Other long term (current) drug therapy: Secondary | ICD-10-CM | POA: Insufficient documentation

## 2017-01-13 DIAGNOSIS — C787 Secondary malignant neoplasm of liver and intrahepatic bile duct: Secondary | ICD-10-CM

## 2017-01-13 DIAGNOSIS — R1011 Right upper quadrant pain: Secondary | ICD-10-CM | POA: Diagnosis present

## 2017-01-13 DIAGNOSIS — E039 Hypothyroidism, unspecified: Secondary | ICD-10-CM | POA: Diagnosis not present

## 2017-01-13 LAB — URINALYSIS, ROUTINE W REFLEX MICROSCOPIC
Bilirubin Urine: NEGATIVE
Glucose, UA: NEGATIVE mg/dL
HGB URINE DIPSTICK: NEGATIVE
Ketones, ur: NEGATIVE mg/dL
NITRITE: NEGATIVE
PROTEIN: NEGATIVE mg/dL
Specific Gravity, Urine: 1.01 (ref 1.005–1.030)
pH: 6.5 (ref 5.0–8.0)

## 2017-01-13 LAB — CBC
HCT: 42.1 % (ref 36.0–46.0)
Hemoglobin: 14.4 g/dL (ref 12.0–15.0)
MCH: 32.4 pg (ref 26.0–34.0)
MCHC: 34.2 g/dL (ref 30.0–36.0)
MCV: 94.6 fL (ref 78.0–100.0)
PLATELETS: 152 10*3/uL (ref 150–400)
RBC: 4.45 MIL/uL (ref 3.87–5.11)
RDW: 17 % — ABNORMAL HIGH (ref 11.5–15.5)
WBC: 6.2 10*3/uL (ref 4.0–10.5)

## 2017-01-13 LAB — POCT I-STAT TROPONIN I: TROPONIN I, POC: 0 ng/mL (ref 0.00–0.08)

## 2017-01-13 LAB — COMPREHENSIVE METABOLIC PANEL
ALK PHOS: 87 U/L (ref 38–126)
ALT: 19 U/L (ref 14–54)
ANION GAP: 10 (ref 5–15)
AST: 27 U/L (ref 15–41)
Albumin: 3.8 g/dL (ref 3.5–5.0)
BILIRUBIN TOTAL: 0.9 mg/dL (ref 0.3–1.2)
BUN: 12 mg/dL (ref 6–20)
CALCIUM: 10 mg/dL (ref 8.9–10.3)
CO2: 27 mmol/L (ref 22–32)
CREATININE: 0.85 mg/dL (ref 0.44–1.00)
Chloride: 95 mmol/L — ABNORMAL LOW (ref 101–111)
Glucose, Bld: 146 mg/dL — ABNORMAL HIGH (ref 65–99)
Potassium: 3.7 mmol/L (ref 3.5–5.1)
Sodium: 132 mmol/L — ABNORMAL LOW (ref 135–145)
TOTAL PROTEIN: 7.5 g/dL (ref 6.5–8.1)

## 2017-01-13 LAB — LIPASE, BLOOD: Lipase: 24 U/L (ref 11–51)

## 2017-01-13 LAB — URINALYSIS, MICROSCOPIC (REFLEX): RBC / HPF: NONE SEEN RBC/hpf (ref 0–5)

## 2017-01-13 MED ORDER — OXYCODONE-ACETAMINOPHEN 5-325 MG PO TABS: 1.0000 | ORAL_TABLET | ORAL | 0 refills | Status: DC | PRN

## 2017-01-13 MED ORDER — ONDANSETRON HCL 8 MG PO TABS: 8.0000 mg | ORAL_TABLET | Freq: Three times a day (TID) | ORAL | 0 refills | Status: DC | PRN

## 2017-01-13 MED ORDER — SODIUM CHLORIDE 0.9 % IV BOLUS (SEPSIS)
1000.0000 mL | Freq: Once | INTRAVENOUS | Status: AC
  Administered 2017-01-13: 1000 mL via INTRAVENOUS

## 2017-01-13 MED ORDER — IOPAMIDOL (ISOVUE-300) INJECTION 61%
INTRAVENOUS | Status: AC
  Filled 2017-01-13: qty 100

## 2017-01-13 MED ORDER — HYDROMORPHONE HCL 1 MG/ML IJ SOLN
1.0000 mg | Freq: Once | INTRAMUSCULAR | Status: AC
  Administered 2017-01-13: 1 mg via INTRAVENOUS
  Filled 2017-01-13: qty 1

## 2017-01-13 MED ORDER — ONDANSETRON HCL 4 MG/2ML IJ SOLN
4.0000 mg | Freq: Once | INTRAMUSCULAR | Status: AC
  Administered 2017-01-13: 4 mg via INTRAVENOUS
  Filled 2017-01-13: qty 2

## 2017-01-13 MED ORDER — IOPAMIDOL (ISOVUE-300) INJECTION 61%
100.0000 mL | Freq: Once | INTRAVENOUS | Status: AC | PRN
  Administered 2017-01-13: 100 mL via INTRAVENOUS

## 2017-01-13 NOTE — Discharge Instructions (Signed)
Stay hydrated.   Take zofran for nausea.  Take percocet as needed for pain.   See your oncologist and regular doctor this week   Return to ER if you have worse abdominal pain, vomiting, fever, chest pain, dehydration

## 2017-01-13 NOTE — ED Triage Notes (Signed)
Pt c/o epigastric abdominal pain radiating to back and right side, nausea, emesis onset Friday. Taking Percocet without relief.  Pt had radiation to bilateral liver on Wednesday, was told radiation shouldn't cause pain. Hx stage IV lung cancer with mets throughout liver.

## 2017-01-13 NOTE — ED Provider Notes (Signed)
[facility] DEPT Provider Note   CSN: [id number] Arrival date & time: 01/13/17  1833     History   Chief Complaint Chief Complaint  Patient presents with  . Abdominal Pain    HPI [name] is a 83 y.o. female history of neuroendocrine tumor, hypertension here presenting with right upper quadrant abdominal pain and back pain. Patient states that about 4 days ago, patient had radioactive embolization of the left lobe of the liver for the metastatic neuroendocrine tumor. She states that her pain improved for about a day and then subsequently became very severe. Patient states that she has constant epigastric and back pain as well as more upper quadrant pain for the last 3 days. Associated with some nausea but no vomiting. Patient states that she has normal bowel movements and no urinary symptoms. She has been taking her Percocet with minimal relief.   The history is provided by the patient.    Past Medical History:  Diagnosis Date  . Acute respiratory failure with hypoxia ([place]) 10/23/2015  . Allergic rhinitis    PT. DENIES  . Anxiety   . Arthritis    NECK  . Ataxia   . Bradycardia    primarily nocturnal  . Burning tongue syndrome 25 years  . Cancer ([place]) dx'd 06/2016   liver  . Cataract   . Cerebellar degeneration   . Chronic pericarditis   . Chronic urinary tract infection   . Complication of anesthesia    low o2 sats, coded 30 years ago  . CVA (cerebral infarction) 05/2003  . Depression   . Encounter for antineoplastic chemotherapy 10/09/2016  . Gait disorder   . Gastric polyps   . GERD (gastroesophageal reflux disease)   . Goals of care, counseling/discussion 10/09/2016  . High cholesterol   . Hyperlipidemia   . Hypertension   . Hypertension   . Hypotension   . Hypothyroidism   . IBS (irritable bowel syndrome)   . Obesity   . Paroxysmal atrial fibrillation ([name])    chads2vasc score is 6,  she is felt to be a poor candidate for anticoagulation  .  Personal history of arterial venous malformation (AVM)    right side of face  . Seizure disorder ([place])   . Seizures ([place]) 2003   " smelling"- Gabapentin "no problem"  . Shortness of breath dyspnea    with exertion  . Sternum fx 10/27/2013  . Stroke [facility]) 5 years ago   Right side of face weak, slurred speach-   . Thyroid disease   . TIA (transient ischemic attack)   . UTI (lower urinary tract infection) 03/27/2016   "frequently"    Patient Active Problem List   Diagnosis Date Noted  . Neuroendocrine carcinoma metastatic to liver ([place]) 12/13/2016  . Back pain 12/13/2016  . Chest pain 12/13/2016  . Goals of care, counseling/discussion 10/09/2016  . Encounter for antineoplastic chemotherapy 10/09/2016  . Neuroendocrine cancer ([place]) 08/23/2016  . Liver metastases ([place]) 08/23/2016  . Late effect of cerebrovascular accident (CVA) 08/02/2016  . H/O: CVA (cerebrovascular accident) 07/14/2016  . Meningioma ([place]) 07/14/2016  . Asthmatic bronchitis 10/23/2015  . GAD (generalized anxiety disorder) 09/30/2015  . Peripheral edema 09/30/2015  . Insomnia 09/30/2015  . Localization-related partial epilepsy with simple partial seizures ([place]) 06/27/2015  . Allergic rhinitis 05/24/2015  . Syncope 01/06/2015  . Sinus bradycardia 01/03/2015  . Paroxysmal atrial fibrillation ([place]) 01/03/2015  . Fatigue 01/03/2015  . Morbid obesity ([place]) 01/03/2015  . Solitary pulmonary nodule  06/02/2014  . Thyroid nodule 05/08/2014  . Palpitation 05/07/2014  . Depression   . Seizure disorder ([place])   . Hypothyroidism   . Cerebellar degeneration   . IRRITABLE BOWEL SYNDROME 02/18/2008  . Hyperlipemia 09/10/2006  . Essential hypertension 09/10/2006  . GERD 09/10/2006  . CVA (cerebral infarction) 05/31/2003    Past Surgical History:  Procedure Laterality Date  . APPENDECTOMY  83 years old  . COLONOSCOPY  2006, 2009  . COLONOSCOPY WITH PROPOFOL N/A 03/28/2016   Procedure: COLONOSCOPY WITH PROPOFOL;   Surgeon: Gatha Mayer, MD;  Location: [facility];  Service: Endoscopy;  Laterality: N/A;  . cyst removed  35 years ago  . EP IMPLANTABLE DEVICE N/A 01/06/2015   Procedure: Loop Recorder Insertion;  Surgeon: Thompson Grayer, MD;  Location: [facility];  Service: Cardiovascular;  Laterality: N/A;  . EYE SURGERY Right    Cataract  . IR ANGIOGRAM SELECTIVE EACH ADDITIONAL VESSEL  11/28/2016  . IR ANGIOGRAM SELECTIVE EACH ADDITIONAL VESSEL  11/28/2016  . IR ANGIOGRAM SELECTIVE EACH ADDITIONAL VESSEL  11/28/2016  . IR ANGIOGRAM SELECTIVE EACH ADDITIONAL VESSEL  11/28/2016  . IR ANGIOGRAM SELECTIVE EACH ADDITIONAL VESSEL  12/13/2016  . IR ANGIOGRAM SELECTIVE EACH ADDITIONAL VESSEL  12/13/2016  . IR ANGIOGRAM SELECTIVE EACH ADDITIONAL VESSEL  01/09/2017  . IR ANGIOGRAM VISCERAL SELECTIVE  11/28/2016  . IR ANGIOGRAM VISCERAL SELECTIVE  11/28/2016  . IR ANGIOGRAM VISCERAL SELECTIVE  12/13/2016  . IR ANGIOGRAM VISCERAL SELECTIVE  12/13/2016  . IR ANGIOGRAM VISCERAL SELECTIVE  01/09/2017  . IR EMBO ARTERIAL NOT HEMORR HEMANG INC GUIDE ROADMAPPING  11/28/2016  . IR EMBO TUMOR ORGAN ISCHEMIA INFARCT INC GUIDE ROADMAPPING  12/13/2016  . IR EMBO TUMOR ORGAN ISCHEMIA INFARCT INC GUIDE ROADMAPPING  01/09/2017  . IR RADIOLOGIST EVAL & MGMT  11/06/2016  . IR RADIOLOGIST EVAL & MGMT  01/02/2017  . IR US GUIDE VASC ACCESS RIGHT  11/28/2016  . IR US GUIDE VASC ACCESS RIGHT  12/13/2016  . IR US GUIDE VASC ACCESS RIGHT  01/09/2017  . KNEE ARTHROSCOPY Right 11/14/2006  . KNEE ARTHROSCOPY Bilateral 5 and 6 years ago  . KNEE ARTHROSCOPY WITH LATERAL MENISECTOMY  07/03/2012   Procedure: KNEE ARTHROSCOPY WITH LATERAL MENISECTOMY;  Surgeon: Magnus Sinning, MD;  Location: [facility];  Service: Orthopedics;  Laterality: Left;  with Partial Lateral Menisectomy and Medial Menisectomy. Shaving of medial and lateral femoral condyles. Shaving of patella. Removal of a loose body  . tibial and fibular internal fixation Left   . TOTAL ABDOMINAL  HYSTERECTOMY  83 years old  . UPPER GASTROINTESTINAL ENDOSCOPY  2009, 2013    OB History    No data available       Home Medications    Prior to Admission medications   Medication Sig Start Date End Date Taking? Authorizing Provider  acetaminophen (TYLENOL) 500 MG tablet Take 500 mg by mouth every 6 (six) hours as needed.   Yes [provider]  amitriptyline (ELAVIL) 25 MG tablet Take 1 tablet (25 mg total) by mouth at bedtime. 10/01/16  Yes Eustaquio Maize, MD  capecitabine (XELODA) 500 MG tablet Take 3 tablets (1,500 mg total) by mouth 2 (two) times daily after a meal. 12/05/16  Yes Curt Bears, MD  escitalopram (LEXAPRO) 20 MG tablet Take 1 tablet (20 mg total) by mouth at bedtime. 10/01/16  Yes Eustaquio Maize, MD  fluticasone (FLONASE) 50 MCG/ACT nasal spray Place 2 sprays into both nostrils at bedtime. 09/20/16  Yes Eustaquio Maize, MD  gabapentin (NEURONTIN) 300 MG capsule Take 1-2 capsules (300-600 mg total) by mouth 4 (four) times daily. 08/23/16  Yes Eustaquio Maize, MD  hydroxypropyl methylcellulose / hypromellose (ISOPTO TEARS / GONIOVISC) 2.5 % ophthalmic solution Place 1 drop into both eyes at bedtime.   Yes [provider]  levothyroxine (SYNTHROID, LEVOTHROID) 75 MCG tablet TAKE 1 TABLET DAILY BEFORE BREAKFAST 12/25/16  Yes Eustaquio Maize, MD  losartan (COZAAR) 50 MG tablet TAKE 1 TABLET DAILY 07/17/16  Yes Evelina Dun A, FNP  lovastatin (MEVACOR) 40 MG tablet TAKE 1 TABLET AT BEDTIME 10/25/16  Yes Eustaquio Maize, MD  Melatonin 3 MG TBDP Take 3-6 mg by mouth at bedtime as needed. Patient taking differently: Take 6 mg by mouth at bedtime as needed.  08/02/16  Yes Eustaquio Maize, MD  omeprazole (PRILOSEC) 40 MG capsule TAKE 1 CAPSULE DAILY 10/25/16  Yes Eustaquio Maize, MD  ondansetron (ZOFRAN) 8 MG tablet Take 1 tablet (8 mg total) by mouth every 8 (eight) hours as needed for nausea or vomiting. 10/09/16  Yes Curt Bears, MD    oxyCODONE-acetaminophen (PERCOCET) 5-325 MG tablet Take 1-2 tablets by mouth every 4 (four) hours as needed. 12/05/16  Yes Curt Bears, MD  Vitamin D, Ergocalciferol, (DRISDOL) 50000 units CAPS capsule Take 1 capsule (50,000 Units total) by mouth every 7 (seven) days. Patient taking differently: Take 50,000 Units by mouth every Friday.  08/23/16  Yes Eustaquio Maize, MD  temozolomide (TEMODAR) 140 MG capsule Take 2 capsules (280 mg total) by mouth daily. May take on an empty stomach or at bedtime to decrease nausea & vomiting. 12/05/16   Curt Bears, MD    Family History Family History  Problem Relation Age of Onset  . Heart attack Father 49       fatal  . Coronary artery disease Brother   . Diabetes Brother   . Prostate cancer Brother   . Prostate cancer Son     Social History Social History  Substance Use Topics  . Smoking status: Former Smoker    Packs/day: 0.25    Years: 10.00    Types: Cigarettes    Quit date: 07/31/1975  . Smokeless tobacco: Never Used  . Alcohol use No     Comment: Rare- maybe a drink ever 2 years     Allergies   Lisinopril   Review of Systems Review of Systems  Gastrointestinal: Positive for abdominal pain.  All other systems reviewed and are negative.    Physical Exam Updated Vital Signs BP (!) 163/74   Pulse 82   Temp 97.7 F (36.5 C) (Oral)   Resp 20   Ht 5\' 4"  (1.626 m)   Wt 101.2 kg (223 lb)   SpO2 93%   BMI 38.28 kg/m   Physical Exam  Constitutional: She is oriented to person, place, and time.  Chronically ill, uncomfortable   HENT:  Head: Normocephalic.  MM dry   Eyes: Conjunctivae and EOM are normal. Pupils are equal, round, and reactive to light.  Neck: Normal range of motion. Neck supple.  Cardiovascular: Normal rate, regular rhythm and normal heart sounds.   Pulmonary/Chest: Effort normal and breath sounds normal. No respiratory distress. She has no wheezes. She has no rales.  Abdominal: Soft. Bowel sounds are  normal.  + RUQ and epigastric tenderness. No obvious CVAT   Musculoskeletal: Normal range of motion.  Neurological: She is alert and oriented to person, place, and time.  Skin: Skin is warm.  Psychiatric: She has a normal mood and affect.  Nursing note and vitals reviewed.    ED Treatments / Results  Labs (all labs ordered are listed, but only abnormal results are displayed) Labs Reviewed  COMPREHENSIVE METABOLIC PANEL - Abnormal; Notable for the following:       Result Value   Sodium 132 (*)    Chloride 95 (*)    Glucose, Bld 146 (*)    All other components within normal limits  CBC - Abnormal; Notable for the following:    RDW 17.0 (*)    All other components within normal limits  URINALYSIS, ROUTINE W REFLEX MICROSCOPIC - Abnormal; Notable for the following:    Leukocytes, UA TRACE (*)    All other components within normal limits  URINALYSIS, MICROSCOPIC (REFLEX) - Abnormal; Notable for the following:    Bacteria, UA RARE (*)    Squamous Epithelial / LPF 6-30 (*)    All other components within normal limits  URINE CULTURE  LIPASE, BLOOD  I-STAT TROPOININ, ED  POCT I-STAT TROPONIN I    EKG  EKG Interpretation None       Radiology Dg Chest 2 View  Result Date: 01/13/2017 CLINICAL DATA:  Initial evaluation for acute chest pain. EXAM: CHEST  2 VIEW COMPARISON:  Prior radiograph from 12/14/2015. FINDINGS: Mild cardiomegaly, stable. Mediastinal silhouette normal. Aortic atherosclerosis. Lungs normally inflated. Mild subsegmental atelectasis present at the left lung base. No focal infiltrates. No pulmonary edema or pleural effusion. No pneumothorax. No acute osseous abnormality. IMPRESSION: 1. Mild left basilar subsegmental atelectasis. No other active cardiopulmonary disease. 2. Aortic atherosclerosis. Electronically Signed   By: Jeannine Boga M.D.   On: 01/13/2017 19:56   Ct Abdomen Pelvis W Contrast  Result Date: 01/13/2017 CLINICAL DATA:  83 year old female  with history of metastatic neuroendocrine tumor status post Y 90 treatment of liver lesions presenting with upper abdominal pain. EXAM: CT ABDOMEN AND PELVIS WITH CONTRAST TECHNIQUE: Multidetector CT imaging of the abdomen and pelvis was performed using the standard protocol following bolus administration of intravenous contrast. CONTRAST:  130mL ISOVUE-300 IOPAMIDOL (ISOVUE-300) INJECTION 61% COMPARISON:  CT of the abdomen pelvis dated 11/13/2016 FINDINGS: Lower chest: The visualized lung bases are clear. No intra-abdominal free air or free fluid. Hepatobiliary: There is a 1.9 x 2.2 cm (previously 2.7 x 2.6 cm) low attenuating lesion in the anterior right lobe of the liver. An ill-defined 2.0 x 2.2 (previously 2.5 x 2.2 cm) lesion is also seen in the left lobe of the liver. There is no intrahepatic biliary ductal dilatation. The gallbladder is unremarkable. No biliary ductal dilatation. No calcified stone noted within the CBD. Pancreas: Unremarkable. No pancreatic ductal dilatation or surrounding inflammatory changes. Spleen: Normal in size without focal abnormality. Adrenals/Urinary Tract: There adrenal glands are unremarkable. Subcentimeter right renal hypodense lesion is too small to characterize but likely represents a cyst. This is similar to the prior CT. There is no hydronephrosis on either side. The visualized ureters and urinary bladder appear unremarkable. Stomach/Bowel: There is no evidence of bowel obstruction or active inflammation. The appendix is not visualized with certainty. No inflammatory changes identified in the right lower quadrant. Vascular/Lymphatic: There is advanced aortoiliac atherosclerotic disease. The origins of the celiac axis, SMA, IMA remain patent. The SMV, splenic vein, and main portal vein are patent. No portal venous gas identified. There is co embolization of left hepatic artery. There is no adenopathy. The IVC appears unremarkable. Reproductive: Hysterectomy. A 1.6 cm  right  ovarian hypodense lesion, likely a cyst. Ultrasound may provide better evaluation. Other: Right inguinal subcutaneous stranding chief likely related to vascular access. No fluid collection or hematoma. There is diastases of anterior abdominal wall musculature with a broad-based fat containing hernia at the level of the umbilicus. Musculoskeletal: There is degenerative changes of the spine. No acute fracture. IMPRESSION: 1. No acute intra-abdominopelvic pathology. 2. Interval decrease in the size of the liver lesions compared to the prior CT. Patient is status post recent Y90 radioembolization of the left lobe of the liver. 3. No bowel obstruction or active inflammation. 4.  Aortic Atherosclerosis (ICD10-I70.0). Electronically Signed   By: Anner Crete M.D.   On: 01/13/2017 22:44    Procedures Procedures (including critical care time)  Medications Ordered in ED Medications  iopamidol (ISOVUE-300) 61 % injection (not administered)  sodium chloride 0.9 % bolus 1,000 mL (0 mLs Intravenous Stopped 01/13/17 2226)  HYDROmorphone (DILAUDID) injection 1 mg (1 mg Intravenous Given 01/13/17 2113)  ondansetron (ZOFRAN) injection 4 mg (4 mg Intravenous Given 01/13/17 2113)  HYDROmorphone (DILAUDID) injection 1 mg (1 mg Intravenous Given 01/13/17 2227)  iopamidol (ISOVUE-300) 61 % injection 100 mL (100 mLs Intravenous Contrast Given 01/13/17 2202)     Initial Impression / Assessment and Plan / ED Course  I have reviewed the triage vital signs and the nursing notes.  Pertinent labs & imaging results that were available during my care of the patient were reviewed by me and considered in my medical decision making (see chart for details).    [name] is a 83 y.o. female here with epigastric pain, RUQ pain, back pain s/p radioactive embolectomy of the liver. Will get labs, UA, CT ab/pel to assess.   11:15 PM Labs unremarkable. CT showed improved liver lesions with no obvious bleeding. Pain improved  with dilaudid. UA showed no obvious UTI and she has no symptoms. I am not sure why she has pain. She does have metastatic neuroendocrine tumor so she is likely going to have chronic pain from it. Has follow up with oncology. Tolerated PO in the ED. Will dc home with percocet, zofran prn.    Final Clinical Impressions(s) / ED Diagnoses   Final diagnoses:  None    New Prescriptions New Prescriptions   No medications on file     Drenda Freeze, MD 01/13/17 2317

## 2017-01-14 ENCOUNTER — Other Ambulatory Visit (HOSPITAL_COMMUNITY): Payer: Self-pay | Admitting: Interventional Radiology

## 2017-01-14 ENCOUNTER — Other Ambulatory Visit: Payer: Medicare Other

## 2017-01-14 ENCOUNTER — Telehealth: Payer: Self-pay | Admitting: Internal Medicine

## 2017-01-14 ENCOUNTER — Ambulatory Visit: Payer: Medicare Other | Admitting: Internal Medicine

## 2017-01-14 ENCOUNTER — Other Ambulatory Visit: Payer: Self-pay | Admitting: *Deleted

## 2017-01-14 DIAGNOSIS — C7A8 Other malignant neuroendocrine tumors: Secondary | ICD-10-CM

## 2017-01-14 NOTE — Telephone Encounter (Signed)
Spoke to husband - r/s appt per 6/18 sch message from Heide Guile - patient is aware of appt date and time.

## 2017-01-14 NOTE — Telephone Encounter (Signed)
Patients husband called and cancelled appointment for today 6/18 due to just getting home from the ER and needs to reschdule

## 2017-01-15 ENCOUNTER — Other Ambulatory Visit: Payer: Medicare Other

## 2017-01-15 DIAGNOSIS — E669 Obesity, unspecified: Secondary | ICD-10-CM | POA: Diagnosis not present

## 2017-01-15 DIAGNOSIS — R300 Dysuria: Secondary | ICD-10-CM | POA: Diagnosis not present

## 2017-01-15 DIAGNOSIS — C801 Malignant (primary) neoplasm, unspecified: Secondary | ICD-10-CM | POA: Diagnosis not present

## 2017-01-15 DIAGNOSIS — C787 Secondary malignant neoplasm of liver and intrahepatic bile duct: Secondary | ICD-10-CM | POA: Diagnosis not present

## 2017-01-15 DIAGNOSIS — I480 Paroxysmal atrial fibrillation: Secondary | ICD-10-CM | POA: Diagnosis not present

## 2017-01-15 DIAGNOSIS — G319 Degenerative disease of nervous system, unspecified: Secondary | ICD-10-CM | POA: Diagnosis not present

## 2017-01-15 DIAGNOSIS — I10 Essential (primary) hypertension: Secondary | ICD-10-CM | POA: Diagnosis not present

## 2017-01-15 LAB — URINALYSIS, COMPLETE
Bilirubin, UA: NEGATIVE
GLUCOSE, UA: NEGATIVE
Ketones, UA: NEGATIVE
Nitrite, UA: NEGATIVE
PROTEIN UA: NEGATIVE
RBC, UA: NEGATIVE
Specific Gravity, UA: 1.01 (ref 1.005–1.030)
Urobilinogen, Ur: 0.2 mg/dL (ref 0.2–1.0)
pH, UA: 7 (ref 5.0–7.5)

## 2017-01-15 LAB — MICROSCOPIC EXAMINATION
Epithelial Cells (non renal): >10 /hpf — AB (ref 0–10)
Renal Epithel, UA: NONE SEEN /hpf

## 2017-01-15 LAB — URINE CULTURE: CULTURE: NO GROWTH

## 2017-01-15 NOTE — Progress Notes (Signed)
Home health nurse brought in urine for UA and culture

## 2017-01-16 ENCOUNTER — Telehealth: Payer: Self-pay | Admitting: Pediatrics

## 2017-01-16 NOTE — Telephone Encounter (Signed)
Patient is having foul urine odor,  Painful urination and abdominal pain

## 2017-01-16 NOTE — Telephone Encounter (Signed)
Pt left a urine sample yesterday. Is pt having UTI symptoms? Fevers? Dysuria? abd pain? Culture is not back yet.

## 2017-01-17 DIAGNOSIS — M25561 Pain in right knee: Secondary | ICD-10-CM | POA: Diagnosis not present

## 2017-01-17 DIAGNOSIS — M1712 Unilateral primary osteoarthritis, left knee: Secondary | ICD-10-CM | POA: Diagnosis not present

## 2017-01-17 DIAGNOSIS — G8929 Other chronic pain: Secondary | ICD-10-CM | POA: Diagnosis not present

## 2017-01-17 DIAGNOSIS — M170 Bilateral primary osteoarthritis of knee: Secondary | ICD-10-CM | POA: Diagnosis not present

## 2017-01-17 DIAGNOSIS — M1711 Unilateral primary osteoarthritis, right knee: Secondary | ICD-10-CM | POA: Diagnosis not present

## 2017-01-20 LAB — URINE CULTURE

## 2017-01-21 ENCOUNTER — Ambulatory Visit (HOSPITAL_BASED_OUTPATIENT_CLINIC_OR_DEPARTMENT_OTHER): Payer: Medicare Other | Admitting: Internal Medicine

## 2017-01-21 ENCOUNTER — Encounter: Payer: Self-pay | Admitting: Internal Medicine

## 2017-01-21 ENCOUNTER — Other Ambulatory Visit (HOSPITAL_BASED_OUTPATIENT_CLINIC_OR_DEPARTMENT_OTHER): Payer: Medicare Other

## 2017-01-21 VITALS — BP 115/62 | HR 71 | Temp 98.4°F | Resp 18 | Ht 64.0 in | Wt 218.1 lb

## 2017-01-21 DIAGNOSIS — C787 Secondary malignant neoplasm of liver and intrahepatic bile duct: Secondary | ICD-10-CM

## 2017-01-21 DIAGNOSIS — I10 Essential (primary) hypertension: Secondary | ICD-10-CM

## 2017-01-21 DIAGNOSIS — Z5111 Encounter for antineoplastic chemotherapy: Secondary | ICD-10-CM

## 2017-01-21 DIAGNOSIS — C7A8 Other malignant neuroendocrine tumors: Secondary | ICD-10-CM | POA: Diagnosis not present

## 2017-01-21 DIAGNOSIS — C7B8 Other secondary neuroendocrine tumors: Secondary | ICD-10-CM | POA: Diagnosis not present

## 2017-01-21 LAB — CBC WITH DIFFERENTIAL/PLATELET
BASO%: 0.8 % (ref 0.0–2.0)
BASOS ABS: 0.1 10*3/uL (ref 0.0–0.1)
EOS ABS: 0.1 10*3/uL (ref 0.0–0.5)
EOS%: 1.8 % (ref 0.0–7.0)
HCT: 39.9 % (ref 34.8–46.6)
HEMOGLOBIN: 13.1 g/dL (ref 11.6–15.9)
LYMPH%: 8.3 % — ABNORMAL LOW (ref 14.0–49.7)
MCH: 33 pg (ref 25.1–34.0)
MCHC: 33 g/dL (ref 31.5–36.0)
MCV: 100.2 fL (ref 79.5–101.0)
MONO#: 0.9 10*3/uL (ref 0.1–0.9)
MONO%: 13 % (ref 0.0–14.0)
NEUT%: 76.1 % (ref 38.4–76.8)
NEUTROS ABS: 5 10*3/uL (ref 1.5–6.5)
PLATELETS: 189 10*3/uL (ref 145–400)
RBC: 3.98 10*6/uL (ref 3.70–5.45)
RDW: 19.9 % — AB (ref 11.2–14.5)
WBC: 6.5 10*3/uL (ref 3.9–10.3)
lymph#: 0.5 10*3/uL — ABNORMAL LOW (ref 0.9–3.3)

## 2017-01-21 LAB — LACTATE DEHYDROGENASE: LDH: 181 U/L (ref 125–245)

## 2017-01-21 MED ORDER — SULFAMETHOXAZOLE-TRIMETHOPRIM 800-160 MG PO TABS: 1.0000 | ORAL_TABLET | Freq: Two times a day (BID) | ORAL | 0 refills | Status: DC

## 2017-01-21 NOTE — Addendum Note (Signed)
Addended by: Eustaquio Maize on: 01/21/2017 07:50 AM   Modules accepted: Orders

## 2017-01-21 NOTE — Progress Notes (Signed)
[facility] [phone]   [phone]  OFFICE PROGRESS NOTE  [name], [place] [address]  DIAGNOSIS: Metastatic intermediate. Neuroendocrine tumor of lung primary diagnosed in January 2018 and presented with small bilateral pulmonary nodules in addition to multiple liver metastasis.  PRIOR THERAPY: None  CURRENT THERAPY: Xeloda 750 MG/M2 twice a day days 1-14 and Temodar 150 MG/M2 days 10-14 every 4 weeks. Status post 3 cycles. She started cycle #4 on 01/04/2017.  INTERVAL HISTORY: [name] 83 y.o. female returns to the clinic today for follow-up visit accompanied by her husband. The patient is feeling a little bit better today. She has increasing abdominal pain and fatigue for 5 days after the last procedure for the Y 90 to the unresectable liver lesion on the right side. She completed the left side several weeks ago. She was seen at the emergency department for evaluation and had repeat CT scan of the abdomen and pelvis on 01/13/2017 that showed no acute into abdominal abnormalities and there was interval decrease in the size of the liver lesions compared to the prior exams. She was given intravenous pain medication and currently on Percocet than feeling a little bit better. She denied having any fever or chills. She has no nausea, vomiting, diarrhea or constipation. She denied having any chest pain, shortness of breath, cough or hemoptysis. She is here today for evaluation and repeat blood work.   MEDICAL HISTORY: Past Medical History:  Diagnosis Date  . Acute respiratory failure with hypoxia ([place]) 10/23/2015  . Allergic rhinitis    PT. DENIES  . Anxiety   . Arthritis    NECK  . Ataxia   . Bradycardia    primarily nocturnal  . Burning tongue syndrome 25 years  . Cancer ([place]) dx'd 06/2016   liver  . Cataract   . Cerebellar degeneration   . Chronic pericarditis   . Chronic urinary tract infection   .  Complication of anesthesia    low o2 sats, coded 30 years ago  . CVA (cerebral infarction) 05/2003  . Depression   . Encounter for antineoplastic chemotherapy 10/09/2016  . Gait disorder   . Gastric polyps   . GERD (gastroesophageal reflux disease)   . Goals of care, counseling/discussion 10/09/2016  . High cholesterol   . Hyperlipidemia   . Hypertension   . Hypertension   . Hypotension   . Hypothyroidism   . IBS (irritable bowel syndrome)   . Obesity   . Paroxysmal atrial fibrillation ([name])    chads2vasc score is 6,  she is felt to be a poor candidate for anticoagulation  . Personal history of arterial venous malformation (AVM)    right side of face  . Seizure disorder ([place])   . Seizures ([place]) 2003   " smelling"- Gabapentin "no problem"  . Shortness of breath dyspnea    with exertion  . Sternum fx 10/27/2013  . Stroke [facility]) 5 years ago   Right side of face weak, slurred speach-   . Thyroid disease   . TIA (transient ischemic attack)   . UTI (lower urinary tract infection) 03/27/2016   "frequently"    ALLERGIES:  is allergic to lisinopril.  MEDICATIONS:  Current Outpatient Prescriptions  Medication Sig Dispense Refill  . acetaminophen (TYLENOL) 500 MG tablet Take 500 mg by mouth every 6 (six) hours as needed.    Marland Kitchen amitriptyline (ELAVIL) 25 MG tablet Take 1 tablet (25 mg  total) by mouth at bedtime. 90 tablet 1  . capecitabine (XELODA) 500 MG tablet Take 3 tablets (1,500 mg total) by mouth 2 (two) times daily after a meal. 84 tablet 2  . escitalopram (LEXAPRO) 20 MG tablet Take 1 tablet (20 mg total) by mouth at bedtime. 90 tablet 1  . fluticasone (FLONASE) 50 MCG/ACT nasal spray Place 2 sprays into both nostrils at bedtime. 16 g 3  . gabapentin (NEURONTIN) 300 MG capsule Take 1-2 capsules (300-600 mg total) by mouth 4 (four) times daily. 450 capsule 1  . hydroxypropyl methylcellulose / hypromellose (ISOPTO TEARS / GONIOVISC) 2.5 % ophthalmic solution Place 1 drop into both  eyes at bedtime.    Marland Kitchen levothyroxine (SYNTHROID, LEVOTHROID) 75 MCG tablet TAKE 1 TABLET DAILY BEFORE BREAKFAST 90 tablet 1  . losartan (COZAAR) 50 MG tablet TAKE 1 TABLET DAILY 90 tablet 1  . lovastatin (MEVACOR) 40 MG tablet TAKE 1 TABLET AT BEDTIME 90 tablet 1  . Melatonin 3 MG TBDP Take 3-6 mg by mouth at bedtime as needed. (Patient taking differently: Take 6 mg by mouth at bedtime as needed. ) 60 tablet 1  . omeprazole (PRILOSEC) 40 MG capsule TAKE 1 CAPSULE DAILY 90 capsule 1  . ondansetron (ZOFRAN) 8 MG tablet Take 1 tablet (8 mg total) by mouth every 8 (eight) hours as needed for nausea or vomiting. 20 tablet 0  . oxyCODONE-acetaminophen (PERCOCET) 5-325 MG tablet Take 1-2 tablets by mouth every 4 (four) hours as needed. 15 tablet 0  . sulfamethoxazole-trimethoprim (BACTRIM DS) 800-160 MG tablet Take 1 tablet by mouth 2 (two) times daily. 10 tablet 0  . temozolomide (TEMODAR) 140 MG capsule Take 2 capsules (280 mg total) by mouth daily. May take on an empty stomach or at bedtime to decrease nausea & vomiting. 10 capsule 2  . Vitamin D, Ergocalciferol, (DRISDOL) 50000 units CAPS capsule Take 1 capsule (50,000 Units total) by mouth every 7 (seven) days. (Patient taking differently: Take 50,000 Units by mouth every Friday. ) 12 capsule 1   No current facility-administered medications for this visit.     SURGICAL HISTORY:  Past Surgical History:  Procedure Laterality Date  . APPENDECTOMY  83 years old  . COLONOSCOPY  2006, 2009  . COLONOSCOPY WITH PROPOFOL N/A 03/28/2016   Procedure: COLONOSCOPY WITH PROPOFOL;  Surgeon: Gatha Mayer, MD;  Location: [facility];  Service: Endoscopy;  Laterality: N/A;  . cyst removed  35 years ago  . EP IMPLANTABLE DEVICE N/A 01/06/2015   Procedure: Loop Recorder Insertion;  Surgeon: Thompson Grayer, MD;  Location: [facility];  Service: Cardiovascular;  Laterality: N/A;  . EYE SURGERY Right    Cataract  . IR ANGIOGRAM SELECTIVE EACH ADDITIONAL VESSEL   11/28/2016  . IR ANGIOGRAM SELECTIVE EACH ADDITIONAL VESSEL  11/28/2016  . IR ANGIOGRAM SELECTIVE EACH ADDITIONAL VESSEL  11/28/2016  . IR ANGIOGRAM SELECTIVE EACH ADDITIONAL VESSEL  11/28/2016  . IR ANGIOGRAM SELECTIVE EACH ADDITIONAL VESSEL  12/13/2016  . IR ANGIOGRAM SELECTIVE EACH ADDITIONAL VESSEL  12/13/2016  . IR ANGIOGRAM SELECTIVE EACH ADDITIONAL VESSEL  01/09/2017  . IR ANGIOGRAM VISCERAL SELECTIVE  11/28/2016  . IR ANGIOGRAM VISCERAL SELECTIVE  11/28/2016  . IR ANGIOGRAM VISCERAL SELECTIVE  12/13/2016  . IR ANGIOGRAM VISCERAL SELECTIVE  12/13/2016  . IR ANGIOGRAM VISCERAL SELECTIVE  01/09/2017  . IR EMBO ARTERIAL NOT HEMORR HEMANG INC GUIDE ROADMAPPING  11/28/2016  . IR EMBO TUMOR ORGAN ISCHEMIA INFARCT INC GUIDE ROADMAPPING  12/13/2016  . IR  EMBO TUMOR ORGAN ISCHEMIA INFARCT INC GUIDE ROADMAPPING  01/09/2017  . IR RADIOLOGIST EVAL & MGMT  11/06/2016  . IR RADIOLOGIST EVAL & MGMT  01/02/2017  . IR US GUIDE VASC ACCESS RIGHT  11/28/2016  . IR US GUIDE VASC ACCESS RIGHT  12/13/2016  . IR US GUIDE VASC ACCESS RIGHT  01/09/2017  . KNEE ARTHROSCOPY Right 11/14/2006  . KNEE ARTHROSCOPY Bilateral 5 and 6 years ago  . KNEE ARTHROSCOPY WITH LATERAL MENISECTOMY  07/03/2012   Procedure: KNEE ARTHROSCOPY WITH LATERAL MENISECTOMY;  Surgeon: Magnus Sinning, MD;  Location: [facility];  Service: Orthopedics;  Laterality: Left;  with Partial Lateral Menisectomy and Medial Menisectomy. Shaving of medial and lateral femoral condyles. Shaving of patella. Removal of a loose body  . tibial and fibular internal fixation Left   . TOTAL ABDOMINAL HYSTERECTOMY  83 years old  . UPPER GASTROINTESTINAL ENDOSCOPY  2009, 2013    REVIEW OF SYSTEMS:  A comprehensive review of systems was negative except for: Constitutional: positive for fatigue Gastrointestinal: positive for nausea   PHYSICAL EXAMINATION: General appearance: alert, cooperative, fatigued and no distress Head: Normocephalic, without obvious abnormality, atraumatic Neck:  no adenopathy, no JVD, supple, symmetrical, trachea midline and thyroid not enlarged, symmetric, no tenderness/mass/nodules Lymph nodes: Cervical, supraclavicular, and axillary nodes normal. Resp: clear to auscultation bilaterally Back: symmetric, no curvature. ROM normal. No CVA tenderness. Cardio: regular rate and rhythm, S1, S2 normal, no murmur, click, rub or gallop GI: soft, non-tender; bowel sounds normal; no masses,  no organomegaly Extremities: extremities normal, atraumatic, no cyanosis or edema  ECOG PERFORMANCE STATUS: 1 - Symptomatic but completely ambulatory  Blood pressure 115/62, pulse 71, temperature 98.4 F (36.9 C), temperature source Oral, resp. rate 18, height '5\' 4"'$  (1.626 m), weight 218 lb 1.6 oz (98.9 kg), SpO2 98 %.  LABORATORY DATA: Lab Results  Component Value Date   WBC 6.5 01/21/2017   HGB 13.1 01/21/2017   HCT 39.9 01/21/2017   MCV 100.2 01/21/2017   PLT 189 01/21/2017      Chemistry      Component Value Date/Time   NA 132 (L) 01/13/2017 1948   NA 138 12/28/2016 1516   NA 139 12/05/2016 1049   K 3.7 01/13/2017 1948   K 4.5 12/05/2016 1049   CL 95 (L) 01/13/2017 1948   CO2 27 01/13/2017 1948   CO2 25 12/05/2016 1049   BUN 12 01/13/2017 1948   BUN 10 12/28/2016 1516   BUN 12.9 12/05/2016 1049   CREATININE 0.85 01/13/2017 1948   CREATININE 0.9 12/05/2016 1049      Component Value Date/Time   CALCIUM 10.0 01/13/2017 1948   CALCIUM 9.8 12/05/2016 1049   ALKPHOS 87 01/13/2017 1948   ALKPHOS 78 12/05/2016 1049   AST 27 01/13/2017 1948   AST 17 12/05/2016 1049   ALT 19 01/13/2017 1948   ALT 14 12/05/2016 1049   BILITOT 0.9 01/13/2017 1948   BILITOT 0.3 12/28/2016 1516   BILITOT 0.50 12/05/2016 1049       RADIOGRAPHIC STUDIES: Dg Chest 2 View  Result Date: 01/13/2017 CLINICAL DATA:  Initial evaluation for acute chest pain. EXAM: CHEST  2 VIEW COMPARISON:  Prior radiograph from 12/14/2015. FINDINGS: Mild cardiomegaly, stable. Mediastinal  silhouette normal. Aortic atherosclerosis. Lungs normally inflated. Mild subsegmental atelectasis present at the left lung base. No focal infiltrates. No pulmonary edema or pleural effusion. No pneumothorax. No acute osseous abnormality. IMPRESSION: 1. Mild left basilar subsegmental atelectasis. No other active cardiopulmonary disease. 2.  Aortic atherosclerosis. Electronically Signed   By: Jeannine Boga M.D.   On: 01/13/2017 19:56   Nm Liver Img Spect  Result Date: 01/09/2017 CLINICAL DATA:  Metastatic malignant neuroendocrine tumor. EXAM: NUCLEAR MEDICINE SPECIAL MED RAD PHYSICS CONS; NUCLEAR MEDICINE RADIO PHARM THERAPY INTRA ARTERIAL; NUCLEAR MEDICINE TREATMENT PROCEDURE; NUCLEAR MEDICINE LIVER SCAN TECHNIQUE: In conjunction with the interventional radiologist a Y- Microsphere dose was calculated utilizing body surface area formulation. Calculated dose equal 16.76 mCi. Pre therapy MAA liver SPECT scan and CTA were evaluated. Utilizing a microcatheter system, the hepatic artery was selected and Y-90 microspheres were delivered in fractionated aliquots. Radiopharmaceutical was delivered by the interventional radiologist and nuclear radiologist. The patient tolerated procedure well. No adverse effects were noted. Bremsstrahlung planar and SPECT imaging of the abdomen following intrahepatic arterial delivery of Y-90 microsphere was performed. RADIOPHARMACEUTICALS:  18.84 millicuries Y- Microspheres COMPARISON:  11/13/2016 FINDINGS: Y - 90 microspheres therapy as above. First therapy the left hepatic lobe. Bremsstrahlung planar and SPECT imaging of the abdomen following intrahepatic arterial delivery of Y-57mcrosphere demonstrates radioactivity localized to the both hepatic lobes. No evidence of extrahepatic activity. IMPRESSION: Successful Y - 90 microsphere delivery for treatment of unresectable liver metastasis. First therapy to the left lobe. Bremssstrahlung scan demonstrates activity localized to both  hepatic lobes with no extrahepatic activity identified. Electronically Signed   By: TKerby MoorsM.D.   On: 01/09/2017 12:16   Ct Abdomen Pelvis W Contrast  Result Date: 01/13/2017 CLINICAL DATA:  83year old female with history of metastatic neuroendocrine tumor status post Y 90 treatment of liver lesions presenting with upper abdominal pain. EXAM: CT ABDOMEN AND PELVIS WITH CONTRAST TECHNIQUE: Multidetector CT imaging of the abdomen and pelvis was performed using the standard protocol following bolus administration of intravenous contrast. CONTRAST:  1035mISOVUE-300 IOPAMIDOL (ISOVUE-300) INJECTION 61% COMPARISON:  CT of the abdomen pelvis dated 11/13/2016 FINDINGS: Lower chest: The visualized lung bases are clear. No intra-abdominal free air or free fluid. Hepatobiliary: There is a 1.9 x 2.2 cm (previously 2.7 x 2.6 cm) low attenuating lesion in the anterior right lobe of the liver. An ill-defined 2.0 x 2.2 (previously 2.5 x 2.2 cm) lesion is also seen in the left lobe of the liver. There is no intrahepatic biliary ductal dilatation. The gallbladder is unremarkable. No biliary ductal dilatation. No calcified stone noted within the CBD. Pancreas: Unremarkable. No pancreatic ductal dilatation or surrounding inflammatory changes. Spleen: Normal in size without focal abnormality. Adrenals/Urinary Tract: There adrenal glands are unremarkable. Subcentimeter right renal hypodense lesion is too small to characterize but likely represents a cyst. This is similar to the prior CT. There is no hydronephrosis on either side. The visualized ureters and urinary bladder appear unremarkable. Stomach/Bowel: There is no evidence of bowel obstruction or active inflammation. The appendix is not visualized with certainty. No inflammatory changes identified in the right lower quadrant. Vascular/Lymphatic: There is advanced aortoiliac atherosclerotic disease. The origins of the celiac axis, SMA, IMA remain patent. The SMV,  splenic vein, and main portal vein are patent. No portal venous gas identified. There is co embolization of left hepatic artery. There is no adenopathy. The IVC appears unremarkable. Reproductive: Hysterectomy. A 1.6 cm right ovarian hypodense lesion, likely a cyst. Ultrasound may provide better evaluation. Other: Right inguinal subcutaneous stranding chief likely related to vascular access. No fluid collection or hematoma. There is diastases of anterior abdominal wall musculature with a broad-based fat containing hernia at the level of the umbilicus. Musculoskeletal: There is degenerative changes of  the spine. No acute fracture. IMPRESSION: 1. No acute intra-abdominopelvic pathology. 2. Interval decrease in the size of the liver lesions compared to the prior CT. Patient is status post recent Y90 radioembolization of the left lobe of the liver. 3. No bowel obstruction or active inflammation. 4.  Aortic Atherosclerosis (ICD10-I70.0). Electronically Signed   By: Anner Crete M.D.   On: 01/13/2017 22:44   Ir Angiogram Visceral Selective  Result Date: 01/09/2017 INDICATION: History of metastatic neuroendocrine cancer. Patient presents today for completion Y 90 radioembolization of the left lobe of the liver. EXAM: 1. SELECTIVE CELIAC ARTERIOGRAM 2. SELECTIVE LEFT HEPATIC ARTERIOGRAM 3. TRANSCATHETER YTTRIUM-90 RADIOEMBOLIZATION OF THE LEFT LOBE OF THE LIVER 4. FLUOROSCOPIC GUIDED ADMINISTRATION OF SIR-SPHERES 5. ULTRASOUND GUIDED VASCULAR ACCESS OF THE RIGHT COMMON FEMORAL ARTERY COMPARISON:  Y 90 radioembolization of the right lobe of the liver- 12/13/2016; 190 mapping embolization - 11/28/2016; CTA of the abdomen and pelvis - 11/13/2016 MEDICATIONS: Zosyn 3.375 g IV ; the antibiotic was administered with an appropriate time frame prior to the initiation of the procedure. Protonix 40 mg IV; Decadron 40 mg IV; Toradol 15 mg IV; Zofran 4 mg IV; Sandostatin 500 mcg IV CONTRAST:  65 cc Isovue-300  ANESTHESIA/SEDATION: Moderate (conscious) sedation was employed during this procedure. A total of Versed 2 mg and Fentanyl 100 mcg was administered intravenously. Moderate Sedation Time: 48 minutes. The patient's level of consciousness and vital signs were monitored continuously by radiology nursing throughout the procedure under my direct supervision. FLUOROSCOPY TIME:  6 minutes 42 seconds (734 mGy) ACCESS: Right common femoral artery; hemostasis achieved with manual compression. COMPLICATIONS: None immediate. TECHNIQUE: Informed written consent was obtained from the patient after a discussion of the risks, benefits and alternatives to treatment. Questions regarding the procedure were encouraged and answered. A timeout was performed prior to the initiation of the procedure. Given concern for the left hepatic artery supplying a falciform artery on initial MAA scan, an ice pack was placed over the ventral aspect of the upper abdomen. The right groin was prepped and draped in the usual sterile fashion, and a sterile drape was applied covering the operative field. Maximum barrier sterile technique with sterile gowns and gloves were used for the procedure. A timeout was performed prior to the initiation of the procedure. Local anesthesia was provided with 1% lidocaine. The right femoral head was marked fluoroscopically. Under direct ultrasound guidance, the right common femoral artery was accessed with a micropuncture kit allowing placement of a 5-French vascular sheath. An ultrasound image was saved for documentation purposes. A limited arteriogram performed through the side arm of the sheath confirming appropriate access within the right common femoral artery. Over a Britta Mccreedy wire, a Mickelson catheter was advanced to the level of the inferior thoracic aorta where it was reformed, back bled and flushed. The Mickelson catheter was utilized to select the celiac artery and a celiac arteriogram was performed. With the use  of a Fathom 14 micro wire, a high-flow microcatheter was advanced into the left hepatic artery and a selective left hepatic arteriogram was performed. Radioembolization was then performed with Yttrium-90 SIR Spheres. Particles were administered via a microcatheter utilizing a completely enclosed system. Monitoring of antegrade flow was performed during and after the administration under fluoroscopy with use of contrast intermittently. After the administration of the particles, the microcatheter, outer catheter and administration system were then discarded into radiation safety receptacle. At this point, the procedure was terminated. The right common femoral approach vascular sheath was removed and  hemostasis was achieved with manual compression. A dressing was placed. The patient tolerated procedure well without immediate postprocedural complication. All participants involved with the procedure were surveyed by the radiation safety officer prior to exiting the fluoroscopy suite. The patient was escorted to the nuclear medicine department for post-treatment imaging. FINDINGS: Celiac arteriogram demonstrates this stable sequela of percutaneous coil embolization of the right gastric artery arising from the proximal aspect of the left hepatic artery. Selective left hepatic arteriogram demonstrates patency of the vascular system without evidence of definitive non hepatic arterial supply. Specifically, previously identified suspected falciform artery was not demonstrated on the present examination. Note, today's embolization was augmented with the use of an ice pack over the ventral aspect of the upper stomach. Intermittent contrast injections during and after the radioembolization confirming preserved antegrade flow without reflux, however contrast injection demonstrates collateralized intrahepatic arterial shunting with opacification of the distal aspect of several branch vessels of the right hepatic artery. IMPRESSION:  Technically successful radioembolization of the left lobe of the liver with Yttrium-90 microspheres. PLAN: Patient be seen in follow-up consultation at the interventional radiology clinic in 3-4 weeks following the acquisition of a postprocedural CMP. Initial postprocedural surveillance imaging will be performed in approximately 3 months (mid September 2018). Electronically Signed   By: Sandi Mariscal M.D.   On: 01/09/2017 14:44   Ir Angiogram Selective Each Additional Vessel  Result Date: 01/09/2017 INDICATION: History of metastatic neuroendocrine cancer. Patient presents today for completion Y 90 radioembolization of the left lobe of the liver. EXAM: 1. SELECTIVE CELIAC ARTERIOGRAM 2. SELECTIVE LEFT HEPATIC ARTERIOGRAM 3. TRANSCATHETER YTTRIUM-90 RADIOEMBOLIZATION OF THE LEFT LOBE OF THE LIVER 4. FLUOROSCOPIC GUIDED ADMINISTRATION OF SIR-SPHERES 5. ULTRASOUND GUIDED VASCULAR ACCESS OF THE RIGHT COMMON FEMORAL ARTERY COMPARISON:  Y 90 radioembolization of the right lobe of the liver- 12/13/2016; 190 mapping embolization - 11/28/2016; CTA of the abdomen and pelvis - 11/13/2016 MEDICATIONS: Zosyn 3.375 g IV ; the antibiotic was administered with an appropriate time frame prior to the initiation of the procedure. Protonix 40 mg IV; Decadron 40 mg IV; Toradol 15 mg IV; Zofran 4 mg IV; Sandostatin 500 mcg IV CONTRAST:  65 cc Isovue-300 ANESTHESIA/SEDATION: Moderate (conscious) sedation was employed during this procedure. A total of Versed 2 mg and Fentanyl 100 mcg was administered intravenously. Moderate Sedation Time: 48 minutes. The patient's level of consciousness and vital signs were monitored continuously by radiology nursing throughout the procedure under my direct supervision. FLUOROSCOPY TIME:  6 minutes 42 seconds (734 mGy) ACCESS: Right common femoral artery; hemostasis achieved with manual compression. COMPLICATIONS: None immediate. TECHNIQUE: Informed written consent was obtained from the patient after a  discussion of the risks, benefits and alternatives to treatment. Questions regarding the procedure were encouraged and answered. A timeout was performed prior to the initiation of the procedure. Given concern for the left hepatic artery supplying a falciform artery on initial MAA scan, an ice pack was placed over the ventral aspect of the upper abdomen. The right groin was prepped and draped in the usual sterile fashion, and a sterile drape was applied covering the operative field. Maximum barrier sterile technique with sterile gowns and gloves were used for the procedure. A timeout was performed prior to the initiation of the procedure. Local anesthesia was provided with 1% lidocaine. The right femoral head was marked fluoroscopically. Under direct ultrasound guidance, the right common femoral artery was accessed with a micropuncture kit allowing placement of a 5-French vascular sheath. An ultrasound image was saved for  documentation purposes. A limited arteriogram performed through the side arm of the sheath confirming appropriate access within the right common femoral artery. Over a Britta Mccreedy wire, a Mickelson catheter was advanced to the level of the inferior thoracic aorta where it was reformed, back bled and flushed. The Mickelson catheter was utilized to select the celiac artery and a celiac arteriogram was performed. With the use of a Fathom 14 micro wire, a high-flow microcatheter was advanced into the left hepatic artery and a selective left hepatic arteriogram was performed. Radioembolization was then performed with Yttrium-90 SIR Spheres. Particles were administered via a microcatheter utilizing a completely enclosed system. Monitoring of antegrade flow was performed during and after the administration under fluoroscopy with use of contrast intermittently. After the administration of the particles, the microcatheter, outer catheter and administration system were then discarded into radiation safety  receptacle. At this point, the procedure was terminated. The right common femoral approach vascular sheath was removed and hemostasis was achieved with manual compression. A dressing was placed. The patient tolerated procedure well without immediate postprocedural complication. All participants involved with the procedure were surveyed by the radiation safety officer prior to exiting the fluoroscopy suite. The patient was escorted to the nuclear medicine department for post-treatment imaging. FINDINGS: Celiac arteriogram demonstrates this stable sequela of percutaneous coil embolization of the right gastric artery arising from the proximal aspect of the left hepatic artery. Selective left hepatic arteriogram demonstrates patency of the vascular system without evidence of definitive non hepatic arterial supply. Specifically, previously identified suspected falciform artery was not demonstrated on the present examination. Note, today's embolization was augmented with the use of an ice pack over the ventral aspect of the upper stomach. Intermittent contrast injections during and after the radioembolization confirming preserved antegrade flow without reflux, however contrast injection demonstrates collateralized intrahepatic arterial shunting with opacification of the distal aspect of several branch vessels of the right hepatic artery. IMPRESSION: Technically successful radioembolization of the left lobe of the liver with Yttrium-90 microspheres. PLAN: Patient be seen in follow-up consultation at the interventional radiology clinic in 3-4 weeks following the acquisition of a postprocedural CMP. Initial postprocedural surveillance imaging will be performed in approximately 3 months (mid September 2018). Electronically Signed   By: Sandi Mariscal M.D.   On: 01/09/2017 14:44   Nm Special Med Rad Physics Cons  Result Date: 01/09/2017 CLINICAL DATA:  Metastatic malignant neuroendocrine tumor. EXAM: NUCLEAR MEDICINE SPECIAL  MED RAD PHYSICS CONS; NUCLEAR MEDICINE RADIO PHARM THERAPY INTRA ARTERIAL; NUCLEAR MEDICINE TREATMENT PROCEDURE; NUCLEAR MEDICINE LIVER SCAN TECHNIQUE: In conjunction with the interventional radiologist a Y- Microsphere dose was calculated utilizing body surface area formulation. Calculated dose equal 16.76 mCi. Pre therapy MAA liver SPECT scan and CTA were evaluated. Utilizing a microcatheter system, the hepatic artery was selected and Y-90 microspheres were delivered in fractionated aliquots. Radiopharmaceutical was delivered by the interventional radiologist and nuclear radiologist. The patient tolerated procedure well. No adverse effects were noted. Bremsstrahlung planar and SPECT imaging of the abdomen following intrahepatic arterial delivery of Y-90 microsphere was performed. RADIOPHARMACEUTICALS:  50.93 millicuries Y- Microspheres COMPARISON:  11/13/2016 FINDINGS: Y - 90 microspheres therapy as above. First therapy the left hepatic lobe. Bremsstrahlung planar and SPECT imaging of the abdomen following intrahepatic arterial delivery of Y-77mcrosphere demonstrates radioactivity localized to the both hepatic lobes. No evidence of extrahepatic activity. IMPRESSION: Successful Y - 90 microsphere delivery for treatment of unresectable liver metastasis. First therapy to the left lobe. Bremssstrahlung scan demonstrates activity localized to both hepatic  lobes with no extrahepatic activity identified. Electronically Signed   By: Kerby Moors M.D.   On: 01/09/2017 12:16   Nm Special Treatment Procedure  Result Date: 01/09/2017 CLINICAL DATA:  Metastatic malignant neuroendocrine tumor. EXAM: NUCLEAR MEDICINE SPECIAL MED RAD PHYSICS CONS; NUCLEAR MEDICINE RADIO PHARM THERAPY INTRA ARTERIAL; NUCLEAR MEDICINE TREATMENT PROCEDURE; NUCLEAR MEDICINE LIVER SCAN TECHNIQUE: In conjunction with the interventional radiologist a Y- Microsphere dose was calculated utilizing body surface area formulation. Calculated dose equal  16.76 mCi. Pre therapy MAA liver SPECT scan and CTA were evaluated. Utilizing a microcatheter system, the hepatic artery was selected and Y-90 microspheres were delivered in fractionated aliquots. Radiopharmaceutical was delivered by the interventional radiologist and nuclear radiologist. The patient tolerated procedure well. No adverse effects were noted. Bremsstrahlung planar and SPECT imaging of the abdomen following intrahepatic arterial delivery of Y-90 microsphere was performed. RADIOPHARMACEUTICALS:  71.69 millicuries Y- Microspheres COMPARISON:  11/13/2016 FINDINGS: Y - 90 microspheres therapy as above. First therapy the left hepatic lobe. Bremsstrahlung planar and SPECT imaging of the abdomen following intrahepatic arterial delivery of Y-4mcrosphere demonstrates radioactivity localized to the both hepatic lobes. No evidence of extrahepatic activity. IMPRESSION: Successful Y - 90 microsphere delivery for treatment of unresectable liver metastasis. First therapy to the left lobe. Bremssstrahlung scan demonstrates activity localized to both hepatic lobes with no extrahepatic activity identified. Electronically Signed   By: TKerby MoorsM.D.   On: 01/09/2017 12:16   Ir [place] Vasc Access Right  Result Date: 01/09/2017 INDICATION: History of metastatic neuroendocrine cancer. Patient presents today for completion Y 90 radioembolization of the left lobe of the liver. EXAM: 1. SELECTIVE CELIAC ARTERIOGRAM 2. SELECTIVE LEFT HEPATIC ARTERIOGRAM 3. TRANSCATHETER YTTRIUM-90 RADIOEMBOLIZATION OF THE LEFT LOBE OF THE LIVER 4. FLUOROSCOPIC GUIDED ADMINISTRATION OF SIR-SPHERES 5. ULTRASOUND GUIDED VASCULAR ACCESS OF THE RIGHT COMMON FEMORAL ARTERY COMPARISON:  Y 90 radioembolization of the right lobe of the liver- 12/13/2016; 190 mapping embolization - 11/28/2016; CTA of the abdomen and pelvis - 11/13/2016 MEDICATIONS: Zosyn 3.375 g IV ; the antibiotic was administered with an appropriate time frame prior to the  initiation of the procedure. Protonix 40 mg IV; Decadron 40 mg IV; Toradol 15 mg IV; Zofran 4 mg IV; Sandostatin 500 mcg IV CONTRAST:  65 cc Isovue-300 ANESTHESIA/SEDATION: Moderate (conscious) sedation was employed during this procedure. A total of Versed 2 mg and Fentanyl 100 mcg was administered intravenously. Moderate Sedation Time: 48 minutes. The patient's level of consciousness and vital signs were monitored continuously by radiology nursing throughout the procedure under my direct supervision. FLUOROSCOPY TIME:  6 minutes 42 seconds (734 mGy) ACCESS: Right common femoral artery; hemostasis achieved with manual compression. COMPLICATIONS: None immediate. TECHNIQUE: Informed written consent was obtained from the patient after a discussion of the risks, benefits and alternatives to treatment. Questions regarding the procedure were encouraged and answered. A timeout was performed prior to the initiation of the procedure. Given concern for the left hepatic artery supplying a falciform artery on initial MAA scan, an ice pack was placed over the ventral aspect of the upper abdomen. The right groin was prepped and draped in the usual sterile fashion, and a sterile drape was applied covering the operative field. Maximum barrier sterile technique with sterile gowns and gloves were used for the procedure. A timeout was performed prior to the initiation of the procedure. Local anesthesia was provided with 1% lidocaine. The right femoral head was marked fluoroscopically. Under direct ultrasound guidance, the right common femoral artery was accessed with  a micropuncture kit allowing placement of a 5-French vascular sheath. An ultrasound image was saved for documentation purposes. A limited arteriogram performed through the side arm of the sheath confirming appropriate access within the right common femoral artery. Over a Britta Mccreedy wire, a Mickelson catheter was advanced to the level of the inferior thoracic aorta where it  was reformed, back bled and flushed. The Mickelson catheter was utilized to select the celiac artery and a celiac arteriogram was performed. With the use of a Fathom 14 micro wire, a high-flow microcatheter was advanced into the left hepatic artery and a selective left hepatic arteriogram was performed. Radioembolization was then performed with Yttrium-90 SIR Spheres. Particles were administered via a microcatheter utilizing a completely enclosed system. Monitoring of antegrade flow was performed during and after the administration under fluoroscopy with use of contrast intermittently. After the administration of the particles, the microcatheter, outer catheter and administration system were then discarded into radiation safety receptacle. At this point, the procedure was terminated. The right common femoral approach vascular sheath was removed and hemostasis was achieved with manual compression. A dressing was placed. The patient tolerated procedure well without immediate postprocedural complication. All participants involved with the procedure were surveyed by the radiation safety officer prior to exiting the fluoroscopy suite. The patient was escorted to the nuclear medicine department for post-treatment imaging. FINDINGS: Celiac arteriogram demonstrates this stable sequela of percutaneous coil embolization of the right gastric artery arising from the proximal aspect of the left hepatic artery. Selective left hepatic arteriogram demonstrates patency of the vascular system without evidence of definitive non hepatic arterial supply. Specifically, previously identified suspected falciform artery was not demonstrated on the present examination. Note, today's embolization was augmented with the use of an ice pack over the ventral aspect of the upper stomach. Intermittent contrast injections during and after the radioembolization confirming preserved antegrade flow without reflux, however contrast injection  demonstrates collateralized intrahepatic arterial shunting with opacification of the distal aspect of several branch vessels of the right hepatic artery. IMPRESSION: Technically successful radioembolization of the left lobe of the liver with Yttrium-90 microspheres. PLAN: Patient be seen in follow-up consultation at the interventional radiology clinic in 3-4 weeks following the acquisition of a postprocedural CMP. Initial postprocedural surveillance imaging will be performed in approximately 3 months (mid September 2018). Electronically Signed   By: Sandi Mariscal M.D.   On: 01/09/2017 14:44   Ir Embo Tumor Organ Ischemia Infarct Inc Guide Roadmapping  Result Date: 01/09/2017 INDICATION: History of metastatic neuroendocrine cancer. Patient presents today for completion Y 90 radioembolization of the left lobe of the liver. EXAM: 1. SELECTIVE CELIAC ARTERIOGRAM 2. SELECTIVE LEFT HEPATIC ARTERIOGRAM 3. TRANSCATHETER YTTRIUM-90 RADIOEMBOLIZATION OF THE LEFT LOBE OF THE LIVER 4. FLUOROSCOPIC GUIDED ADMINISTRATION OF SIR-SPHERES 5. ULTRASOUND GUIDED VASCULAR ACCESS OF THE RIGHT COMMON FEMORAL ARTERY COMPARISON:  Y 90 radioembolization of the right lobe of the liver- 12/13/2016; 190 mapping embolization - 11/28/2016; CTA of the abdomen and pelvis - 11/13/2016 MEDICATIONS: Zosyn 3.375 g IV ; the antibiotic was administered with an appropriate time frame prior to the initiation of the procedure. Protonix 40 mg IV; Decadron 40 mg IV; Toradol 15 mg IV; Zofran 4 mg IV; Sandostatin 500 mcg IV CONTRAST:  65 cc Isovue-300 ANESTHESIA/SEDATION: Moderate (conscious) sedation was employed during this procedure. A total of Versed 2 mg and Fentanyl 100 mcg was administered intravenously. Moderate Sedation Time: 48 minutes. The patient's level of consciousness and vital signs were monitored continuously by radiology nursing throughout the  procedure under my direct supervision. FLUOROSCOPY TIME:  6 minutes 42 seconds (734 mGy) ACCESS:  Right common femoral artery; hemostasis achieved with manual compression. COMPLICATIONS: None immediate. TECHNIQUE: Informed written consent was obtained from the patient after a discussion of the risks, benefits and alternatives to treatment. Questions regarding the procedure were encouraged and answered. A timeout was performed prior to the initiation of the procedure. Given concern for the left hepatic artery supplying a falciform artery on initial MAA scan, an ice pack was placed over the ventral aspect of the upper abdomen. The right groin was prepped and draped in the usual sterile fashion, and a sterile drape was applied covering the operative field. Maximum barrier sterile technique with sterile gowns and gloves were used for the procedure. A timeout was performed prior to the initiation of the procedure. Local anesthesia was provided with 1% lidocaine. The right femoral head was marked fluoroscopically. Under direct ultrasound guidance, the right common femoral artery was accessed with a micropuncture kit allowing placement of a 5-French vascular sheath. An ultrasound image was saved for documentation purposes. A limited arteriogram performed through the side arm of the sheath confirming appropriate access within the right common femoral artery. Over a Britta Mccreedy wire, a Mickelson catheter was advanced to the level of the inferior thoracic aorta where it was reformed, back bled and flushed. The Mickelson catheter was utilized to select the celiac artery and a celiac arteriogram was performed. With the use of a Fathom 14 micro wire, a high-flow microcatheter was advanced into the left hepatic artery and a selective left hepatic arteriogram was performed. Radioembolization was then performed with Yttrium-90 SIR Spheres. Particles were administered via a microcatheter utilizing a completely enclosed system. Monitoring of antegrade flow was performed during and after the administration under fluoroscopy with use of  contrast intermittently. After the administration of the particles, the microcatheter, outer catheter and administration system were then discarded into radiation safety receptacle. At this point, the procedure was terminated. The right common femoral approach vascular sheath was removed and hemostasis was achieved with manual compression. A dressing was placed. The patient tolerated procedure well without immediate postprocedural complication. All participants involved with the procedure were surveyed by the radiation safety officer prior to exiting the fluoroscopy suite. The patient was escorted to the nuclear medicine department for post-treatment imaging. FINDINGS: Celiac arteriogram demonstrates this stable sequela of percutaneous coil embolization of the right gastric artery arising from the proximal aspect of the left hepatic artery. Selective left hepatic arteriogram demonstrates patency of the vascular system without evidence of definitive non hepatic arterial supply. Specifically, previously identified suspected falciform artery was not demonstrated on the present examination. Note, today's embolization was augmented with the use of an ice pack over the ventral aspect of the upper stomach. Intermittent contrast injections during and after the radioembolization confirming preserved antegrade flow without reflux, however contrast injection demonstrates collateralized intrahepatic arterial shunting with opacification of the distal aspect of several branch vessels of the right hepatic artery. IMPRESSION: Technically successful radioembolization of the left lobe of the liver with Yttrium-90 microspheres. PLAN: Patient be seen in follow-up consultation at the interventional radiology clinic in 3-4 weeks following the acquisition of a postprocedural CMP. Initial postprocedural surveillance imaging will be performed in approximately 3 months (mid September 2018). Electronically Signed   By: Sandi Mariscal M.D.   On:  01/09/2017 14:44   Ir Radiologist Eval & Mgmt  Result Date: 01/11/2017 Please refer to notes tab for details about interventional procedure. (Op  Note)  Nm Radio Pharm Therapy Intraarterial  Result Date: 01/09/2017 CLINICAL DATA:  Metastatic malignant neuroendocrine tumor. EXAM: NUCLEAR MEDICINE SPECIAL MED RAD PHYSICS CONS; NUCLEAR MEDICINE RADIO PHARM THERAPY INTRA ARTERIAL; NUCLEAR MEDICINE TREATMENT PROCEDURE; NUCLEAR MEDICINE LIVER SCAN TECHNIQUE: In conjunction with the interventional radiologist a Y- Microsphere dose was calculated utilizing body surface area formulation. Calculated dose equal 16.76 mCi. Pre therapy MAA liver SPECT scan and CTA were evaluated. Utilizing a microcatheter system, the hepatic artery was selected and Y-90 microspheres were delivered in fractionated aliquots. Radiopharmaceutical was delivered by the interventional radiologist and nuclear radiologist. The patient tolerated procedure well. No adverse effects were noted. Bremsstrahlung planar and SPECT imaging of the abdomen following intrahepatic arterial delivery of Y-90 microsphere was performed. RADIOPHARMACEUTICALS:  82.95 millicuries Y- Microspheres COMPARISON:  11/13/2016 FINDINGS: Y - 90 microspheres therapy as above. First therapy the left hepatic lobe. Bremsstrahlung planar and SPECT imaging of the abdomen following intrahepatic arterial delivery of Y-61mcrosphere demonstrates radioactivity localized to the both hepatic lobes. No evidence of extrahepatic activity. IMPRESSION: Successful Y - 90 microsphere delivery for treatment of unresectable liver metastasis. First therapy to the left lobe. Bremssstrahlung scan demonstrates activity localized to both hepatic lobes with no extrahepatic activity identified. Electronically Signed   By: TKerby MoorsM.D.   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 She is currently undergoing systemic chemotherapy with Xeloda and Temodar status post 3 cycles and she is currently undergoing cycle #4. She also underwent treatment with Y 90 to the left and right lobe of the liver. She tolerated the procedure well and the recent CT scan showed some improvement in her disease. I recommended for the patient to continue her current treatment with Xeloda and Temodar. I will see her back for follow-up visit in 3 weeks for evaluation and repeat blood work. For pain management the patient will continue her treatment with Percocet. She was advised to call immediately if she has any concerning symptoms in the interval. The patient voices understanding of current disease status and treatment options and is in agreement with the current care plan. All questions were answered. The patient knows to call the clinic with any problems, questions or concerns. We can certainly see the patient much sooner if necessary. I spent 10 minutes counseling the patient face to face. The total time spent in the appointment was 15 minutes. Disclaimer: This note was dictated with voice recognition software. Similar sounding words can inadvertently be transcribed and may not be corrected upon review.

## 2017-01-25 ENCOUNTER — Ambulatory Visit (INDEPENDENT_AMBULATORY_CARE_PROVIDER_SITE_OTHER): Payer: Medicare Other | Admitting: *Deleted

## 2017-01-25 ENCOUNTER — Ambulatory Visit (INDEPENDENT_AMBULATORY_CARE_PROVIDER_SITE_OTHER): Payer: Medicare Other | Admitting: Pediatrics

## 2017-01-25 ENCOUNTER — Encounter: Payer: Self-pay | Admitting: Pediatrics

## 2017-01-25 VITALS — BP 128/69 | HR 83 | Temp 97.0°F | Ht 64.0 in

## 2017-01-25 DIAGNOSIS — C7A8 Other malignant neuroendocrine tumors: Secondary | ICD-10-CM

## 2017-01-25 DIAGNOSIS — R002 Palpitations: Secondary | ICD-10-CM

## 2017-01-25 DIAGNOSIS — C7B8 Other secondary neuroendocrine tumors: Secondary | ICD-10-CM

## 2017-01-25 DIAGNOSIS — N3090 Cystitis, unspecified without hematuria: Secondary | ICD-10-CM | POA: Diagnosis not present

## 2017-01-25 NOTE — Progress Notes (Signed)
  Subjective:   Patient ID: [name], female    DOB: May 31, 1934, 83 y.o.   MRN: [id number] CC: Follow-up UTI symptoms, med problems HPI: [name] is a 83 y.o. female presenting for Follow-up  UTI symptoms much improved No fevers One tab left of abx  Pain improved, not as sore now, now bending over bc makes R sided pain worse Hurt all over with radiation Threw up once Took pain medication a few times a day right after XRT, rarely needing it now  No CP, no SOB Had a headache this morning, resolved with tylenol  Relevant past medical, surgical, family and social history reviewed. Allergies and medications reviewed and updated. History  Smoking Status  . Former Smoker  . Packs/day: 0.25  . Years: 10.00  . Types: Cigarettes  . Quit date: 07/31/1975  Smokeless Tobacco  . Never Used   ROS: Per HPI   Objective:    BP 128/69   Pulse 83   Temp 97 F (36.1 C) (Oral)   Ht '5\' 4"'$  (1.626 m)   Wt Readings from Last 3 Encounters:  01/21/17 218 lb 1.6 oz (98.9 kg)  01/13/17 223 lb (101.2 kg)  01/02/17 225 lb (102.1 kg)    Gen: NAD, alert, cooperative with exam, NCAT EYES: EOMI, no conjunctival injection, or no icterus CV: NRRR, normal S1/S2, no murmur, distal pulses 2+ b/l Resp: CTABL, no wheezes, normal WOB Abd: +BS, soft, NTND. no guarding or organomegaly Ext: No edema, warm Neuro: Alert and oriented  Assessment & Plan:  [name] was seen today for follow-up med problem.  Diagnoses and all orders for this visit:  Neuroendocrine carcinoma metastatic to liver [facility]) Needs labs before next procedure Pain now well controlled -     CMP14+EGFR  Cystitis Improving on abx, continue  Follow up plan: Return in about 3 months (around 04/27/2017). Assunta Found, MD [place]

## 2017-01-26 LAB — CMP14+EGFR
A/G RATIO: 1.4 (ref 1.2–2.2)
ALBUMIN: 4.3 g/dL (ref 3.5–4.7)
ALK PHOS: 109 IU/L (ref 39–117)
ALT: 22 IU/L (ref 0–32)
AST: 31 IU/L (ref 0–40)
BUN / CREAT RATIO: 11 — AB (ref 12–28)
BUN: 13 mg/dL (ref 8–27)
Bilirubin Total: 0.5 mg/dL (ref 0.0–1.2)
CO2: 23 mmol/L (ref 20–29)
CREATININE: 1.15 mg/dL — AB (ref 0.57–1.00)
Calcium: 10.3 mg/dL (ref 8.7–10.3)
Chloride: 97 mmol/L (ref 96–106)
GFR calc Af Amer: 52 mL/min/{1.73_m2} — ABNORMAL LOW (ref >59)
GFR, EST NON AFRICAN AMERICAN: 45 mL/min/{1.73_m2} — AB (ref >59)
GLOBULIN, TOTAL: 3 g/dL (ref 1.5–4.5)
Glucose: 93 mg/dL (ref 65–99)
POTASSIUM: 5 mmol/L (ref 3.5–5.2)
Sodium: 135 mmol/L (ref 134–144)
Total Protein: 7.3 g/dL (ref 6.0–8.5)

## 2017-01-28 NOTE — Progress Notes (Signed)
Carelink Summary Report / Loop Recorder 

## 2017-01-29 NOTE — Telephone Encounter (Signed)
Lab results faxed to [place] [facility]

## 2017-01-31 DIAGNOSIS — I480 Paroxysmal atrial fibrillation: Secondary | ICD-10-CM | POA: Diagnosis not present

## 2017-01-31 DIAGNOSIS — I10 Essential (primary) hypertension: Secondary | ICD-10-CM | POA: Diagnosis not present

## 2017-01-31 DIAGNOSIS — G319 Degenerative disease of nervous system, unspecified: Secondary | ICD-10-CM | POA: Diagnosis not present

## 2017-01-31 DIAGNOSIS — C787 Secondary malignant neoplasm of liver and intrahepatic bile duct: Secondary | ICD-10-CM | POA: Diagnosis not present

## 2017-01-31 DIAGNOSIS — E669 Obesity, unspecified: Secondary | ICD-10-CM | POA: Diagnosis not present

## 2017-01-31 DIAGNOSIS — C801 Malignant (primary) neoplasm, unspecified: Secondary | ICD-10-CM | POA: Diagnosis not present

## 2017-01-31 MED FILL — TEMOZOLOMIDE 140 MG CAPSULE: 140 | 5 days supply | Qty: 10 | Fill #1

## 2017-01-31 MED FILL — CAPECITABINE 500 MG TABLET: 500 | 14 days supply | Qty: 84 | Fill #1

## 2017-02-04 LAB — CUP PACEART REMOTE DEVICE CHECK
Date Time Interrogation Session: 20180629201038
MDC IDC PG IMPLANT DT: 20160609

## 2017-02-05 ENCOUNTER — Ambulatory Visit
Admission: RE | Admit: 2017-02-05 | Discharge: 2017-02-05 | Disposition: A | Discharge status: Home / Self Care | Payer: Medicare Other | Source: Ambulatory Visit | Attending: Interventional Radiology | Admitting: Interventional Radiology

## 2017-02-05 DIAGNOSIS — C7A1 Malignant poorly differentiated neuroendocrine tumors: Secondary | ICD-10-CM | POA: Diagnosis not present

## 2017-02-05 DIAGNOSIS — C787 Secondary malignant neoplasm of liver and intrahepatic bile duct: Secondary | ICD-10-CM | POA: Diagnosis not present

## 2017-02-05 DIAGNOSIS — Z9889 Other specified postprocedural states: Secondary | ICD-10-CM | POA: Diagnosis not present

## 2017-02-05 DIAGNOSIS — C7A8 Other malignant neuroendocrine tumors: Secondary | ICD-10-CM

## 2017-02-05 HISTORY — PX: IR RADIOLOGIST EVAL & MGMT: IMG5224

## 2017-02-05 NOTE — Progress Notes (Signed)
Patient ID: [name], female   DOB: October 08, 1933, 83 y.o.   MRN: [id number]         Chief Complaint: Metastatic neuroendocrine tumor, post Y-90 radioembolization  Referring Physician(s): Mohamed  History of Present Illness:  [name] a 83 y.o.femalewith complex past medical history significant for bradycardia, paroxysmal atrial fibrillation, chronic pericarditis, CVA andcerebellar degeneration (patient is wheelchair bound and requires assistance for transferring), hypertension and hyperlipidemia, who has recently been diagnosed with metastatic neuroendocrine tumor of the lung following ultrasound-guided liver lesion biopsy on 08/20/2016.  Patiently initially started on treatment with alternating two-week cycles of Xeloda and Temodar and while she has been tolerating the treatment well, CT scan of the abdomen and pelvis performed for the evaluation of right groin pain demonstrates progression of known hepatic metastatic disease and as such, patient was referred for evaluation of candidacy for hepatic directed therapy at the request of Dr. Earlie Server and underwent technically successful mapping Y 90 radioembolization on 11/28/2016 and subsequent Y 90 radioembolization of the right lobe of the liver on 12/13/2016 and radioembolization of the left lobe of the liver on 01/09/2017  The patient returns to the interventional radiology clinic for post procedural evaluation.  The patient is again accompanied by her husband though serves as her own historian.  Unfortunately, the patient tolerated both Y 90 embolizations poorly.  The initial embolization was associated with the acute onset of nonspecific chest and abdominal pain which coincided with the beginning of the Y-90 administration.  As such the entire calculated Y-90 dose was unable to be administered.  Subsequent cardiac workup was negative however patient's abdominal pain persisted for approximately 5 days following the procedure,  though she states that it resolved nearly as quickly as it occured.  While the patient tolerated the subsequent embolization without incident, she states that she developed severe LEFT upper quadrant abdominal pain approximately 3 days following the embolization. She states his abdominal pain lasted for approximately 2 weeks (at one point, necessitating presentation to the emergency department) however again resolved nearly as quickly as it occured.  Patient reports a non-painful anterior abdominal rash.  She has otherwise returned to her baseline state of relatively poor health and is without complaint.    Past Medical History:  Diagnosis Date  . Acute respiratory failure with hypoxia ([place]) 10/23/2015  . Allergic rhinitis    PT. DENIES  . Anxiety   . Arthritis    NECK  . Ataxia   . Bradycardia    primarily nocturnal  . Burning tongue syndrome 25 years  . Cancer ([place]) dx'd 06/2016   liver  . Cataract   . Cerebellar degeneration   . Chronic pericarditis   . Chronic urinary tract infection   . Complication of anesthesia    low o2 sats, coded 30 years ago  . CVA (cerebral infarction) 05/2003  . Depression   . Encounter for antineoplastic chemotherapy 10/09/2016  . Gait disorder   . Gastric polyps   . GERD (gastroesophageal reflux disease)   . Goals of care, counseling/discussion 10/09/2016  . High cholesterol   . Hyperlipidemia   . Hypertension   . Hypertension   . Hypotension   . Hypothyroidism   . IBS (irritable bowel syndrome)   . Obesity   . Paroxysmal atrial fibrillation ([name])    chads2vasc score is 6,  she is felt to be a poor candidate for anticoagulation  . Personal history of arterial venous malformation (AVM)    right side of face  .  Seizure disorder ([place])   . Seizures ([place]) 2003   " smelling"- Gabapentin "no problem"  . Shortness of breath dyspnea    with exertion  . Sternum fx 10/27/2013  . Stroke [facility] 5 years ago   Right side of face weak, slurred speach-    . Thyroid disease   . TIA (transient ischemic attack)   . UTI (lower urinary tract infection) 03/27/2016   "frequently"    Past Surgical History:  Procedure Laterality Date  . APPENDECTOMY  83 years old  . COLONOSCOPY  2006, 2009  . COLONOSCOPY WITH PROPOFOL N/A 03/28/2016   Procedure: COLONOSCOPY WITH PROPOFOL;  Surgeon: Gatha Mayer, MD;  Location: [facility];  Service: Endoscopy;  Laterality: N/A;  . cyst removed  83 years ago  . EP IMPLANTABLE DEVICE N/A 01/06/2015   Procedure: Loop Recorder Insertion;  Surgeon: Thompson Grayer, MD;  Location: [facility];  Service: Cardiovascular;  Laterality: N/A;  . EYE SURGERY Right    Cataract  . IR ANGIOGRAM SELECTIVE EACH ADDITIONAL VESSEL  11/28/2016  . IR ANGIOGRAM SELECTIVE EACH ADDITIONAL VESSEL  11/28/2016  . IR ANGIOGRAM SELECTIVE EACH ADDITIONAL VESSEL  11/28/2016  . IR ANGIOGRAM SELECTIVE EACH ADDITIONAL VESSEL  11/28/2016  . IR ANGIOGRAM SELECTIVE EACH ADDITIONAL VESSEL  12/13/2016  . IR ANGIOGRAM SELECTIVE EACH ADDITIONAL VESSEL  12/13/2016  . IR ANGIOGRAM SELECTIVE EACH ADDITIONAL VESSEL  01/09/2017  . IR ANGIOGRAM VISCERAL SELECTIVE  11/28/2016  . IR ANGIOGRAM VISCERAL SELECTIVE  11/28/2016  . IR ANGIOGRAM VISCERAL SELECTIVE  12/13/2016  . IR ANGIOGRAM VISCERAL SELECTIVE  12/13/2016  . IR ANGIOGRAM VISCERAL SELECTIVE  01/09/2017  . IR EMBO ARTERIAL NOT HEMORR HEMANG INC GUIDE ROADMAPPING  11/28/2016  . IR EMBO TUMOR ORGAN ISCHEMIA INFARCT INC GUIDE ROADMAPPING  12/13/2016  . IR EMBO TUMOR ORGAN ISCHEMIA INFARCT INC GUIDE ROADMAPPING  01/09/2017  . IR RADIOLOGIST EVAL & MGMT  11/06/2016  . IR RADIOLOGIST EVAL & MGMT  01/02/2017  . IR US GUIDE VASC ACCESS RIGHT  11/28/2016  . IR US GUIDE VASC ACCESS RIGHT  12/13/2016  . IR US GUIDE VASC ACCESS RIGHT  01/09/2017  . KNEE ARTHROSCOPY Right 11/14/2006  . KNEE ARTHROSCOPY Bilateral 5 and 6 years ago  . KNEE ARTHROSCOPY WITH LATERAL MENISECTOMY  07/03/2012   Procedure: KNEE ARTHROSCOPY WITH LATERAL  MENISECTOMY;  Surgeon: Magnus Sinning, MD;  Location: [facility];  Service: Orthopedics;  Laterality: Left;  with Partial Lateral Menisectomy and Medial Menisectomy. Shaving of medial and lateral femoral condyles. Shaving of patella. Removal of a loose body  . tibial and fibular internal fixation Left   . TOTAL ABDOMINAL HYSTERECTOMY  83 years old  . UPPER GASTROINTESTINAL ENDOSCOPY  2009, 2013    Allergies: Lisinopril  Medications: Prior to Admission medications   Medication Sig Start Date End Date Taking? Authorizing Provider  acetaminophen (TYLENOL) 500 MG tablet Take 500 mg by mouth every 6 (six) hours as needed.   Yes [provider]  amitriptyline (ELAVIL) 25 MG tablet Take 1 tablet (25 mg total) by mouth at bedtime. 10/01/16  Yes Eustaquio Maize, MD  capecitabine (XELODA) 500 MG tablet Take 3 tablets (1,500 mg total) by mouth 2 (two) times daily after a meal. 12/05/16  Yes Curt Bears, MD  escitalopram (LEXAPRO) 20 MG tablet Take 1 tablet (20 mg total) by mouth at bedtime. 10/01/16  Yes Eustaquio Maize, MD  fluticasone (FLONASE) 50 MCG/ACT nasal spray Place 2 sprays into both nostrils at  bedtime. 09/20/16  Yes Eustaquio Maize, MD  gabapentin (NEURONTIN) 300 MG capsule Take 1-2 capsules (300-600 mg total) by mouth 4 (four) times daily. 08/23/16  Yes Eustaquio Maize, MD  hydroxypropyl methylcellulose / hypromellose (ISOPTO TEARS / GONIOVISC) 2.5 % ophthalmic solution Place 1 drop into both eyes at bedtime.   Yes [provider]  levothyroxine (SYNTHROID, LEVOTHROID) 75 MCG tablet TAKE 1 TABLET DAILY BEFORE BREAKFAST 12/25/16  Yes Eustaquio Maize, MD  losartan (COZAAR) 50 MG tablet TAKE 1 TABLET DAILY 01/14/17  Yes Eustaquio Maize, MD  lovastatin (MEVACOR) 40 MG tablet TAKE 1 TABLET AT BEDTIME 10/25/16  Yes Eustaquio Maize, MD  Melatonin 3 MG TBDP Take 3-6 mg by mouth at bedtime as needed. Patient taking differently: Take 6 mg by mouth at bedtime as needed.  08/02/16  Yes  Eustaquio Maize, MD  omeprazole (PRILOSEC) 40 MG capsule TAKE 1 CAPSULE DAILY 10/25/16  Yes Eustaquio Maize, MD  ondansetron (ZOFRAN) 8 MG tablet Take 1 tablet (8 mg total) by mouth every 8 (eight) hours as needed for nausea or vomiting. 01/13/17  Yes Drenda Freeze, MD  oxyCODONE-acetaminophen (PERCOCET) 5-325 MG tablet Take 1-2 tablets by mouth every 4 (four) hours as needed. 01/13/17  Yes Drenda Freeze, MD  temozolomide [facility]) 140 MG capsule Take 2 capsules (280 mg total) by mouth daily. May take on an empty stomach or at bedtime to decrease nausea & vomiting. 12/05/16  Yes Curt Bears, MD  Vitamin D, Ergocalciferol, (DRISDOL) 50000 units CAPS capsule Take 1 capsule (50,000 Units total) by mouth every 7 (seven) days. Patient taking differently: Take 50,000 Units by mouth every Friday.  08/23/16  Yes Eustaquio Maize, MD     Family History  Problem Relation Age of Onset  . Heart attack Father 61       fatal  . Coronary artery disease Brother   . Diabetes Brother   . Prostate cancer Brother   . Prostate cancer Son     Social History   Social History  . Marital status: Married    Spouse name: N/A  . Number of children: N/A  . Years of education: N/A   Social History Main Topics  . Smoking status: Former Smoker    Packs/day: 0.25    Years: 10.00    Types: Cigarettes    Quit date: 07/31/1975  . Smokeless tobacco: Never Used  . Alcohol use No     Comment: Rare- maybe a drink ever 2 years  . Drug use: No  . Sexual activity: Not on file   Other Topics Concern  . Not on file   Social History Narrative   Lives with husband, does have stairs, does not use them. Pt completed 10th grade.    ECOG Status: 2 - Symptomatic, <50% confined to bed  Review of Systems: A 12 point ROS discussed and pertinent positives are indicated in the HPI above.  All other systems are negative.  Review of Systems  Constitutional: Negative for activity change, appetite change, fatigue  and fever.  Cardiovascular: Negative.   Gastrointestinal: Negative.   Skin: Positive for rash.       Patient admits to rash involving the anterior abdomen.    Vital Signs: BP (!) 116/55   Pulse 74   Temp 98.4 F (36.9 C) (Oral)   Resp 14   Ht _0  (1.626 m)   Wt 218 lb (98.9 kg)   SpO2 95%   BMI  37.42 kg/m   Physical Exam  Skin: Rash noted.  Very ill-defined apparent rash involving the periumbilical abdomen and left upper abdominal quadrant without associated erythema or skin ulceration.  I do not feel this the sequela of nontarget radioembolization.  Nursing note and vitals reviewed.   Mallampati Score:     Imaging: Dg Chest 2 View  Result Date: 01/13/2017 CLINICAL DATA:  Initial evaluation for acute chest pain. EXAM: CHEST  2 VIEW COMPARISON:  Prior radiograph from 12/14/2015. FINDINGS: Mild cardiomegaly, stable. Mediastinal silhouette normal. Aortic atherosclerosis. Lungs normally inflated. Mild subsegmental atelectasis present at the left lung base. No focal infiltrates. No pulmonary edema or pleural effusion. No pneumothorax. No acute osseous abnormality. IMPRESSION: 1. Mild left basilar subsegmental atelectasis. No other active cardiopulmonary disease. 2. Aortic atherosclerosis. Electronically Signed   By: Jeannine Boga M.D.   On: 01/13/2017 19:56   Nm Liver Img Spect  Result Date: 01/09/2017 CLINICAL DATA:  Metastatic malignant neuroendocrine tumor. EXAM: NUCLEAR MEDICINE SPECIAL MED RAD PHYSICS CONS; NUCLEAR MEDICINE RADIO PHARM THERAPY INTRA ARTERIAL; NUCLEAR MEDICINE TREATMENT PROCEDURE; NUCLEAR MEDICINE LIVER SCAN TECHNIQUE: In conjunction with the interventional radiologist a Y- Microsphere dose was calculated utilizing body surface area formulation. Calculated dose equal 16.76 mCi. Pre therapy MAA liver SPECT scan and CTA were evaluated. Utilizing a microcatheter system, the hepatic artery was selected and Y-90 microspheres were delivered in fractionated  aliquots. Radiopharmaceutical was delivered by the interventional radiologist and nuclear radiologist. The patient tolerated procedure well. No adverse effects were noted. Bremsstrahlung planar and SPECT imaging of the abdomen following intrahepatic arterial delivery of Y-90 microsphere was performed. RADIOPHARMACEUTICALS:  02.40 millicuries Y- Microspheres COMPARISON:  11/13/2016 FINDINGS: Y - 90 microspheres therapy as above. First therapy the left hepatic lobe. Bremsstrahlung planar and SPECT imaging of the abdomen following intrahepatic arterial delivery of Y-47mcrosphere demonstrates radioactivity localized to the both hepatic lobes. No evidence of extrahepatic activity. IMPRESSION: Successful Y - 90 microsphere delivery for treatment of unresectable liver metastasis. First therapy to the left lobe. Bremssstrahlung scan demonstrates activity localized to both hepatic lobes with no extrahepatic activity identified. Electronically Signed   By: TKerby MoorsM.D.   On: 01/09/2017 12:16   Ct Abdomen Pelvis W Contrast  Result Date: 01/13/2017 CLINICAL DATA:  83year old female with history of metastatic neuroendocrine tumor status post Y 90 treatment of liver lesions presenting with upper abdominal pain. EXAM: CT ABDOMEN AND PELVIS WITH CONTRAST TECHNIQUE: Multidetector CT imaging of the abdomen and pelvis was performed using the standard protocol following bolus administration of intravenous contrast. CONTRAST:  1024mISOVUE-300 IOPAMIDOL (ISOVUE-300) INJECTION 61% COMPARISON:  CT of the abdomen pelvis dated 11/13/2016 FINDINGS: Lower chest: The visualized lung bases are clear. No intra-abdominal free air or free fluid. Hepatobiliary: There is a 1.9 x 2.2 cm (previously 2.7 x 2.6 cm) low attenuating lesion in the anterior right lobe of the liver. An ill-defined 2.0 x 2.2 (previously 2.5 x 2.2 cm) lesion is also seen in the left lobe of the liver. There is no intrahepatic biliary ductal dilatation. The  gallbladder is unremarkable. No biliary ductal dilatation. No calcified stone noted within the CBD. Pancreas: Unremarkable. No pancreatic ductal dilatation or surrounding inflammatory changes. Spleen: Normal in size without focal abnormality. Adrenals/Urinary Tract: There adrenal glands are unremarkable. Subcentimeter right renal hypodense lesion is too small to characterize but likely represents a cyst. This is similar to the prior CT. There is no hydronephrosis on either side. The visualized ureters and urinary bladder appear unremarkable. Stomach/Bowel:  There is no evidence of bowel obstruction or active inflammation. The appendix is not visualized with certainty. No inflammatory changes identified in the right lower quadrant. Vascular/Lymphatic: There is advanced aortoiliac atherosclerotic disease. The origins of the celiac axis, SMA, IMA remain patent. The SMV, splenic vein, and main portal vein are patent. No portal venous gas identified. There is co embolization of left hepatic artery. There is no adenopathy. The IVC appears unremarkable. Reproductive: Hysterectomy. A 1.6 cm right ovarian hypodense lesion, likely a cyst. Ultrasound may provide better evaluation. Other: Right inguinal subcutaneous stranding chief likely related to vascular access. No fluid collection or hematoma. There is diastases of anterior abdominal wall musculature with a broad-based fat containing hernia at the level of the umbilicus. Musculoskeletal: There is degenerative changes of the spine. No acute fracture. IMPRESSION: 1. No acute intra-abdominopelvic pathology. 2. Interval decrease in the size of the liver lesions compared to the prior CT. Patient is status post recent Y90 radioembolization of the left lobe of the liver. 3. No bowel obstruction or active inflammation. 4.  Aortic Atherosclerosis (ICD10-I70.0). Electronically Signed   By: Anner Crete M.D.   On: 01/13/2017 22:44   Ir Angiogram Visceral Selective  Result  Date: 01/09/2017 INDICATION: History of metastatic neuroendocrine cancer. Patient presents today for completion Y 90 radioembolization of the left lobe of the liver. EXAM: 1. SELECTIVE CELIAC ARTERIOGRAM 2. SELECTIVE LEFT HEPATIC ARTERIOGRAM 3. TRANSCATHETER YTTRIUM-90 RADIOEMBOLIZATION OF THE LEFT LOBE OF THE LIVER 4. FLUOROSCOPIC GUIDED ADMINISTRATION OF SIR-SPHERES 5. ULTRASOUND GUIDED VASCULAR ACCESS OF THE RIGHT COMMON FEMORAL ARTERY COMPARISON:  Y 90 radioembolization of the right lobe of the liver- 12/13/2016; 190 mapping embolization - 11/28/2016; CTA of the abdomen and pelvis - 11/13/2016 MEDICATIONS: Zosyn 3.375 g IV ; the antibiotic was administered with an appropriate time frame prior to the initiation of the procedure. Protonix 40 mg IV; Decadron 40 mg IV; Toradol 15 mg IV; Zofran 4 mg IV; Sandostatin 500 mcg IV CONTRAST:  65 cc Isovue-300 ANESTHESIA/SEDATION: Moderate (conscious) sedation was employed during this procedure. A total of Versed 2 mg and Fentanyl 100 mcg was administered intravenously. Moderate Sedation Time: 48 minutes. The patient's level of consciousness and vital signs were monitored continuously by radiology nursing throughout the procedure under my direct supervision. FLUOROSCOPY TIME:  6 minutes 42 seconds (734 mGy) ACCESS: Right common femoral artery; hemostasis achieved with manual compression. COMPLICATIONS: None immediate. TECHNIQUE: Informed written consent was obtained from the patient after a discussion of the risks, benefits and alternatives to treatment. Questions regarding the procedure were encouraged and answered. A timeout was performed prior to the initiation of the procedure. Given concern for the left hepatic artery supplying a falciform artery on initial MAA scan, an ice pack was placed over the ventral aspect of the upper abdomen. The right groin was prepped and draped in the usual sterile fashion, and a sterile drape was applied covering the operative field.  Maximum barrier sterile technique with sterile gowns and gloves were used for the procedure. A timeout was performed prior to the initiation of the procedure. Local anesthesia was provided with 1% lidocaine. The right femoral head was marked fluoroscopically. Under direct ultrasound guidance, the right common femoral artery was accessed with a micropuncture kit allowing placement of a 5-French vascular sheath. An ultrasound image was saved for documentation purposes. A limited arteriogram performed through the side arm of the sheath confirming appropriate access within the right common femoral artery. Over a Britta Mccreedy wire, a Mickelson catheter was advanced  to the level of the inferior thoracic aorta where it was reformed, back bled and flushed. The Mickelson catheter was utilized to select the celiac artery and a celiac arteriogram was performed. With the use of a Fathom 14 micro wire, a high-flow microcatheter was advanced into the left hepatic artery and a selective left hepatic arteriogram was performed. Radioembolization was then performed with Yttrium-90 SIR Spheres. Particles were administered via a microcatheter utilizing a completely enclosed system. Monitoring of antegrade flow was performed during and after the administration under fluoroscopy with use of contrast intermittently. After the administration of the particles, the microcatheter, outer catheter and administration system were then discarded into radiation safety receptacle. At this point, the procedure was terminated. The right common femoral approach vascular sheath was removed and hemostasis was achieved with manual compression. A dressing was placed. The patient tolerated procedure well without immediate postprocedural complication. All participants involved with the procedure were surveyed by the radiation safety officer prior to exiting the fluoroscopy suite. The patient was escorted to the nuclear medicine department for post-treatment  imaging. FINDINGS: Celiac arteriogram demonstrates this stable sequela of percutaneous coil embolization of the right gastric artery arising from the proximal aspect of the left hepatic artery. Selective left hepatic arteriogram demonstrates patency of the vascular system without evidence of definitive non hepatic arterial supply. Specifically, previously identified suspected falciform artery was not demonstrated on the present examination. Note, today's embolization was augmented with the use of an ice pack over the ventral aspect of the upper stomach. Intermittent contrast injections during and after the radioembolization confirming preserved antegrade flow without reflux, however contrast injection demonstrates collateralized intrahepatic arterial shunting with opacification of the distal aspect of several branch vessels of the right hepatic artery. IMPRESSION: Technically successful radioembolization of the left lobe of the liver with Yttrium-90 microspheres. PLAN: Patient be seen in follow-up consultation at the interventional radiology clinic in 3-4 weeks following the acquisition of a postprocedural CMP. Initial postprocedural surveillance imaging will be performed in approximately 3 months (mid September 2018). Electronically Signed   By: Sandi Mariscal M.D.   On: 01/09/2017 14:44   Ir Angiogram Selective Each Additional Vessel  Result Date: 01/09/2017 INDICATION: History of metastatic neuroendocrine cancer. Patient presents today for completion Y 90 radioembolization of the left lobe of the liver. EXAM: 1. SELECTIVE CELIAC ARTERIOGRAM 2. SELECTIVE LEFT HEPATIC ARTERIOGRAM 3. TRANSCATHETER YTTRIUM-90 RADIOEMBOLIZATION OF THE LEFT LOBE OF THE LIVER 4. FLUOROSCOPIC GUIDED ADMINISTRATION OF SIR-SPHERES 5. ULTRASOUND GUIDED VASCULAR ACCESS OF THE RIGHT COMMON FEMORAL ARTERY COMPARISON:  Y 90 radioembolization of the right lobe of the liver- 12/13/2016; 190 mapping embolization - 11/28/2016; CTA of the abdomen  and pelvis - 11/13/2016 MEDICATIONS: Zosyn 3.375 g IV ; the antibiotic was administered with an appropriate time frame prior to the initiation of the procedure. Protonix 40 mg IV; Decadron 40 mg IV; Toradol 15 mg IV; Zofran 4 mg IV; Sandostatin 500 mcg IV CONTRAST:  65 cc Isovue-300 ANESTHESIA/SEDATION: Moderate (conscious) sedation was employed during this procedure. A total of Versed 2 mg and Fentanyl 100 mcg was administered intravenously. Moderate Sedation Time: 48 minutes. The patient's level of consciousness and vital signs were monitored continuously by radiology nursing throughout the procedure under my direct supervision. FLUOROSCOPY TIME:  6 minutes 42 seconds (734 mGy) ACCESS: Right common femoral artery; hemostasis achieved with manual compression. COMPLICATIONS: None immediate. TECHNIQUE: Informed written consent was obtained from the patient after a discussion of the risks, benefits and alternatives to treatment. Questions regarding the  procedure were encouraged and answered. A timeout was performed prior to the initiation of the procedure. Given concern for the left hepatic artery supplying a falciform artery on initial MAA scan, an ice pack was placed over the ventral aspect of the upper abdomen. The right groin was prepped and draped in the usual sterile fashion, and a sterile drape was applied covering the operative field. Maximum barrier sterile technique with sterile gowns and gloves were used for the procedure. A timeout was performed prior to the initiation of the procedure. Local anesthesia was provided with 1% lidocaine. The right femoral head was marked fluoroscopically. Under direct ultrasound guidance, the right common femoral artery was accessed with a micropuncture kit allowing placement of a 5-French vascular sheath. An ultrasound image was saved for documentation purposes. A limited arteriogram performed through the side arm of the sheath confirming appropriate access within the right  common femoral artery. Over a Britta Mccreedy wire, a Mickelson catheter was advanced to the level of the inferior thoracic aorta where it was reformed, back bled and flushed. The Mickelson catheter was utilized to select the celiac artery and a celiac arteriogram was performed. With the use of a Fathom 14 micro wire, a high-flow microcatheter was advanced into the left hepatic artery and a selective left hepatic arteriogram was performed. Radioembolization was then performed with Yttrium-90 SIR Spheres. Particles were administered via a microcatheter utilizing a completely enclosed system. Monitoring of antegrade flow was performed during and after the administration under fluoroscopy with use of contrast intermittently. After the administration of the particles, the microcatheter, outer catheter and administration system were then discarded into radiation safety receptacle. At this point, the procedure was terminated. The right common femoral approach vascular sheath was removed and hemostasis was achieved with manual compression. A dressing was placed. The patient tolerated procedure well without immediate postprocedural complication. All participants involved with the procedure were surveyed by the radiation safety officer prior to exiting the fluoroscopy suite. The patient was escorted to the nuclear medicine department for post-treatment imaging. FINDINGS: Celiac arteriogram demonstrates this stable sequela of percutaneous coil embolization of the right gastric artery arising from the proximal aspect of the left hepatic artery. Selective left hepatic arteriogram demonstrates patency of the vascular system without evidence of definitive non hepatic arterial supply. Specifically, previously identified suspected falciform artery was not demonstrated on the present examination. Note, today's embolization was augmented with the use of an ice pack over the ventral aspect of the upper stomach. Intermittent contrast injections  during and after the radioembolization confirming preserved antegrade flow without reflux, however contrast injection demonstrates collateralized intrahepatic arterial shunting with opacification of the distal aspect of several branch vessels of the right hepatic artery. IMPRESSION: Technically successful radioembolization of the left lobe of the liver with Yttrium-90 microspheres. PLAN: Patient be seen in follow-up consultation at the interventional radiology clinic in 3-4 weeks following the acquisition of a postprocedural CMP. Initial postprocedural surveillance imaging will be performed in approximately 3 months (mid September 2018). Electronically Signed   By: Sandi Mariscal M.D.   On: 01/09/2017 14:44   Nm Special Med Rad Physics Cons  Result Date: 01/09/2017 CLINICAL DATA:  Metastatic malignant neuroendocrine tumor. EXAM: NUCLEAR MEDICINE SPECIAL MED RAD PHYSICS CONS; NUCLEAR MEDICINE RADIO PHARM THERAPY INTRA ARTERIAL; NUCLEAR MEDICINE TREATMENT PROCEDURE; NUCLEAR MEDICINE LIVER SCAN TECHNIQUE: In conjunction with the interventional radiologist a Y- Microsphere dose was calculated utilizing body surface area formulation. Calculated dose equal 16.76 mCi. Pre therapy MAA liver SPECT scan and CTA  were evaluated. Utilizing a microcatheter system, the hepatic artery was selected and Y-90 microspheres were delivered in fractionated aliquots. Radiopharmaceutical was delivered by the interventional radiologist and nuclear radiologist. The patient tolerated procedure well. No adverse effects were noted. Bremsstrahlung planar and SPECT imaging of the abdomen following intrahepatic arterial delivery of Y-90 microsphere was performed. RADIOPHARMACEUTICALS:  99.37 millicuries Y- Microspheres COMPARISON:  11/13/2016 FINDINGS: Y - 90 microspheres therapy as above. First therapy the left hepatic lobe. Bremsstrahlung planar and SPECT imaging of the abdomen following intrahepatic arterial delivery of Y-20mcrosphere  demonstrates radioactivity localized to the both hepatic lobes. No evidence of extrahepatic activity. IMPRESSION: Successful Y - 90 microsphere delivery for treatment of unresectable liver metastasis. First therapy to the left lobe. Bremssstrahlung scan demonstrates activity localized to both hepatic lobes with no extrahepatic activity identified. Electronically Signed   By: TKerby MoorsM.D.   On: 01/09/2017 12:16   Nm Special Treatment Procedure  Result Date: 01/09/2017 CLINICAL DATA:  Metastatic malignant neuroendocrine tumor. EXAM: NUCLEAR MEDICINE SPECIAL MED RAD PHYSICS CONS; NUCLEAR MEDICINE RADIO PHARM THERAPY INTRA ARTERIAL; NUCLEAR MEDICINE TREATMENT PROCEDURE; NUCLEAR MEDICINE LIVER SCAN TECHNIQUE: In conjunction with the interventional radiologist a Y- Microsphere dose was calculated utilizing body surface area formulation. Calculated dose equal 16.76 mCi. Pre therapy MAA liver SPECT scan and CTA were evaluated. Utilizing a microcatheter system, the hepatic artery was selected and Y-90 microspheres were delivered in fractionated aliquots. Radiopharmaceutical was delivered by the interventional radiologist and nuclear radiologist. The patient tolerated procedure well. No adverse effects were noted. Bremsstrahlung planar and SPECT imaging of the abdomen following intrahepatic arterial delivery of Y-90 microsphere was performed. RADIOPHARMACEUTICALS:  116.96millicuries Y- Microspheres COMPARISON:  11/13/2016 FINDINGS: Y - 90 microspheres therapy as above. First therapy the left hepatic lobe. Bremsstrahlung planar and SPECT imaging of the abdomen following intrahepatic arterial delivery of Y-935mrosphere demonstrates radioactivity localized to the both hepatic lobes. No evidence of extrahepatic activity. IMPRESSION: Successful Y - 90 microsphere delivery for treatment of unresectable liver metastasis. First therapy to the left lobe. Bremssstrahlung scan demonstrates activity localized to both hepatic  lobes with no extrahepatic activity identified. Electronically Signed   By: TaKerby Moors.D.   On: 01/09/2017 12:16   Ir UsKoreauide Vasc Access Right  Result Date: 01/09/2017 INDICATION: History of metastatic neuroendocrine cancer. Patient presents today for completion Y 90 radioembolization of the left lobe of the liver. EXAM: 1. SELECTIVE CELIAC ARTERIOGRAM 2. SELECTIVE LEFT HEPATIC ARTERIOGRAM 3. TRANSCATHETER YTTRIUM-90 RADIOEMBOLIZATION OF THE LEFT LOBE OF THE LIVER 4. FLUOROSCOPIC GUIDED ADMINISTRATION OF SIR-SPHERES 5. ULTRASOUND GUIDED VASCULAR ACCESS OF THE RIGHT COMMON FEMORAL ARTERY COMPARISON:  Y 90 radioembolization of the right lobe of the liver- 12/13/2016; 190 mapping embolization - 11/28/2016; CTA of the abdomen and pelvis - 11/13/2016 MEDICATIONS: Zosyn 3.375 g IV ; the antibiotic was administered with an appropriate time frame prior to the initiation of the procedure. Protonix 40 mg IV; Decadron 40 mg IV; Toradol 15 mg IV; Zofran 4 mg IV; Sandostatin 500 mcg IV CONTRAST:  65 cc Isovue-300 ANESTHESIA/SEDATION: Moderate (conscious) sedation was employed during this procedure. A total of Versed 2 mg and Fentanyl 100 mcg was administered intravenously. Moderate Sedation Time: 48 minutes. The patient's level of consciousness and vital signs were monitored continuously by radiology nursing throughout the procedure under my direct supervision. FLUOROSCOPY TIME:  6 minutes 42 seconds (734 mGy) ACCESS: Right common femoral artery; hemostasis achieved with manual compression. COMPLICATIONS: None immediate. TECHNIQUE: Informed written consent was obtained from  the patient after a discussion of the risks, benefits and alternatives to treatment. Questions regarding the procedure were encouraged and answered. A timeout was performed prior to the initiation of the procedure. Given concern for the left hepatic artery supplying a falciform artery on initial MAA scan, an ice pack was placed over the ventral  aspect of the upper abdomen. The right groin was prepped and draped in the usual sterile fashion, and a sterile drape was applied covering the operative field. Maximum barrier sterile technique with sterile gowns and gloves were used for the procedure. A timeout was performed prior to the initiation of the procedure. Local anesthesia was provided with 1% lidocaine. The right femoral head was marked fluoroscopically. Under direct ultrasound guidance, the right common femoral artery was accessed with a micropuncture kit allowing placement of a 5-French vascular sheath. An ultrasound image was saved for documentation purposes. A limited arteriogram performed through the side arm of the sheath confirming appropriate access within the right common femoral artery. Over a Britta Mccreedy wire, a Mickelson catheter was advanced to the level of the inferior thoracic aorta where it was reformed, back bled and flushed. The Mickelson catheter was utilized to select the celiac artery and a celiac arteriogram was performed. With the use of a Fathom 14 micro wire, a high-flow microcatheter was advanced into the left hepatic artery and a selective left hepatic arteriogram was performed. Radioembolization was then performed with Yttrium-90 SIR Spheres. Particles were administered via a microcatheter utilizing a completely enclosed system. Monitoring of antegrade flow was performed during and after the administration under fluoroscopy with use of contrast intermittently. After the administration of the particles, the microcatheter, outer catheter and administration system were then discarded into radiation safety receptacle. At this point, the procedure was terminated. The right common femoral approach vascular sheath was removed and hemostasis was achieved with manual compression. A dressing was placed. The patient tolerated procedure well without immediate postprocedural complication. All participants involved with the procedure were  surveyed by the radiation safety officer prior to exiting the fluoroscopy suite. The patient was escorted to the nuclear medicine department for post-treatment imaging. FINDINGS: Celiac arteriogram demonstrates this stable sequela of percutaneous coil embolization of the right gastric artery arising from the proximal aspect of the left hepatic artery. Selective left hepatic arteriogram demonstrates patency of the vascular system without evidence of definitive non hepatic arterial supply. Specifically, previously identified suspected falciform artery was not demonstrated on the present examination. Note, today's embolization was augmented with the use of an ice pack over the ventral aspect of the upper stomach. Intermittent contrast injections during and after the radioembolization confirming preserved antegrade flow without reflux, however contrast injection demonstrates collateralized intrahepatic arterial shunting with opacification of the distal aspect of several branch vessels of the right hepatic artery. IMPRESSION: Technically successful radioembolization of the left lobe of the liver with Yttrium-90 microspheres. PLAN: Patient be seen in follow-up consultation at the interventional radiology clinic in 3-4 weeks following the acquisition of a postprocedural CMP. Initial postprocedural surveillance imaging will be performed in approximately 3 months (mid September 2018). Electronically Signed   By: Sandi Mariscal M.D.   On: 01/09/2017 14:44   Ir Embo Tumor Organ Ischemia Infarct Inc Guide Roadmapping  Result Date: 01/09/2017 INDICATION: History of metastatic neuroendocrine cancer. Patient presents today for completion Y 90 radioembolization of the left lobe of the liver. EXAM: 1. SELECTIVE CELIAC ARTERIOGRAM 2. SELECTIVE LEFT HEPATIC ARTERIOGRAM 3. TRANSCATHETER YTTRIUM-90 RADIOEMBOLIZATION OF THE LEFT LOBE OF THE LIVER  4. FLUOROSCOPIC GUIDED ADMINISTRATION OF SIR-SPHERES 5. ULTRASOUND GUIDED VASCULAR ACCESS  OF THE RIGHT COMMON FEMORAL ARTERY COMPARISON:  Y 90 radioembolization of the right lobe of the liver- 12/13/2016; 190 mapping embolization - 11/28/2016; CTA of the abdomen and pelvis - 11/13/2016 MEDICATIONS: Zosyn 3.375 g IV ; the antibiotic was administered with an appropriate time frame prior to the initiation of the procedure. Protonix 40 mg IV; Decadron 40 mg IV; Toradol 15 mg IV; Zofran 4 mg IV; Sandostatin 500 mcg IV CONTRAST:  65 cc Isovue-300 ANESTHESIA/SEDATION: Moderate (conscious) sedation was employed during this procedure. A total of Versed 2 mg and Fentanyl 100 mcg was administered intravenously. Moderate Sedation Time: 48 minutes. The patient's level of consciousness and vital signs were monitored continuously by radiology nursing throughout the procedure under my direct supervision. FLUOROSCOPY TIME:  6 minutes 42 seconds (734 mGy) ACCESS: Right common femoral artery; hemostasis achieved with manual compression. COMPLICATIONS: None immediate. TECHNIQUE: Informed written consent was obtained from the patient after a discussion of the risks, benefits and alternatives to treatment. Questions regarding the procedure were encouraged and answered. A timeout was performed prior to the initiation of the procedure. Given concern for the left hepatic artery supplying a falciform artery on initial MAA scan, an ice pack was placed over the ventral aspect of the upper abdomen. The right groin was prepped and draped in the usual sterile fashion, and a sterile drape was applied covering the operative field. Maximum barrier sterile technique with sterile gowns and gloves were used for the procedure. A timeout was performed prior to the initiation of the procedure. Local anesthesia was provided with 1% lidocaine. The right femoral head was marked fluoroscopically. Under direct ultrasound guidance, the right common femoral artery was accessed with a micropuncture kit allowing placement of a 5-French vascular sheath.  An ultrasound image was saved for documentation purposes. A limited arteriogram performed through the side arm of the sheath confirming appropriate access within the right common femoral artery. Over a Britta Mccreedy wire, a Mickelson catheter was advanced to the level of the inferior thoracic aorta where it was reformed, back bled and flushed. The Mickelson catheter was utilized to select the celiac artery and a celiac arteriogram was performed. With the use of a Fathom 14 micro wire, a high-flow microcatheter was advanced into the left hepatic artery and a selective left hepatic arteriogram was performed. Radioembolization was then performed with Yttrium-90 SIR Spheres. Particles were administered via a microcatheter utilizing a completely enclosed system. Monitoring of antegrade flow was performed during and after the administration under fluoroscopy with use of contrast intermittently. After the administration of the particles, the microcatheter, outer catheter and administration system were then discarded into radiation safety receptacle. At this point, the procedure was terminated. The right common femoral approach vascular sheath was removed and hemostasis was achieved with manual compression. A dressing was placed. The patient tolerated procedure well without immediate postprocedural complication. All participants involved with the procedure were surveyed by the radiation safety officer prior to exiting the fluoroscopy suite. The patient was escorted to the nuclear medicine department for post-treatment imaging. FINDINGS: Celiac arteriogram demonstrates this stable sequela of percutaneous coil embolization of the right gastric artery arising from the proximal aspect of the left hepatic artery. Selective left hepatic arteriogram demonstrates patency of the vascular system without evidence of definitive non hepatic arterial supply. Specifically, previously identified suspected falciform artery was not demonstrated on  the present examination. Note, today's embolization was augmented with the use of an  ice pack over the ventral aspect of the upper stomach. Intermittent contrast injections during and after the radioembolization confirming preserved antegrade flow without reflux, however contrast injection demonstrates collateralized intrahepatic arterial shunting with opacification of the distal aspect of several branch vessels of the right hepatic artery. IMPRESSION: Technically successful radioembolization of the left lobe of the liver with Yttrium-90 microspheres. PLAN: Patient be seen in follow-up consultation at the interventional radiology clinic in 3-4 weeks following the acquisition of a postprocedural CMP. Initial postprocedural surveillance imaging will be performed in approximately 3 months (mid September 2018). Electronically Signed   By: Sandi Mariscal M.D.   On: 01/09/2017 14:44   Nm Radio Pharm Therapy Intraarterial  Result Date: 01/09/2017 CLINICAL DATA:  Metastatic malignant neuroendocrine tumor. EXAM: NUCLEAR MEDICINE SPECIAL MED RAD PHYSICS CONS; NUCLEAR MEDICINE RADIO PHARM THERAPY INTRA ARTERIAL; NUCLEAR MEDICINE TREATMENT PROCEDURE; NUCLEAR MEDICINE LIVER SCAN TECHNIQUE: In conjunction with the interventional radiologist a Y- Microsphere dose was calculated utilizing body surface area formulation. Calculated dose equal 16.76 mCi. Pre therapy MAA liver SPECT scan and CTA were evaluated. Utilizing a microcatheter system, the hepatic artery was selected and Y-90 microspheres were delivered in fractionated aliquots. Radiopharmaceutical was delivered by the interventional radiologist and nuclear radiologist. The patient tolerated procedure well. No adverse effects were noted. Bremsstrahlung planar and SPECT imaging of the abdomen following intrahepatic arterial delivery of Y-90 microsphere was performed. RADIOPHARMACEUTICALS:  57.26 millicuries Y- Microspheres COMPARISON:  11/13/2016 FINDINGS: Y - 90 microspheres  therapy as above. First therapy the left hepatic lobe. Bremsstrahlung planar and SPECT imaging of the abdomen following intrahepatic arterial delivery of Y-43mcrosphere demonstrates radioactivity localized to the both hepatic lobes. No evidence of extrahepatic activity. IMPRESSION: Successful Y - 90 microsphere delivery for treatment of unresectable liver metastasis. First therapy to the left lobe. Bremssstrahlung scan demonstrates activity localized to both hepatic lobes with no extrahepatic activity identified. Electronically Signed   By: TKerby MoorsM.D.   On: 01/09/2017 12:16    Labs:  CBC:  Recent Labs  12/14/16 0420 01/09/17 0744 01/13/17 1948 01/21/17 1309  WBC 10.9* 3.7* 6.2 6.5  HGB 13.6 12.9 14.4 13.1  HCT 40.3 38.8 42.1 39.9  PLT 169 164 152 189    COAGS:  Recent Labs  08/20/16 1244 11/28/16 0743 01/09/17 0744  INR 1.07 1.04 0.98  APTT 27  --   --     BMP:  Recent Labs  12/28/16 1516 01/09/17 0744 01/13/17 1948 01/25/17 1523  NA 138 137 132* 135  K 5.2 4.3 3.7 5.0  CL 100 104 95* 97  CO2 _0 GLUCOSE 102* 109* 146* 93  BUN _1 CALCIUM 10.0 9.9 10.0 10.3  CREATININE 0.85 0.86 0.85 1.15*  GFRNONAA 65 >60 >60 45*  GFRAA 75 >60 >60 52*    LIVER FUNCTION TESTS:  Recent Labs  12/28/16 1516 01/09/17 0744 01/13/17 1948 01/25/17 1523  BILITOT 0.3 0.7 0.9 0.5  AST 22 34 27 31  ALT _2 ALKPHOS 86 87 87 109  PROT 6.6 7.1 7.5 7.3  ALBUMIN 3.8 3.7 3.8 4.3    TUMOR MARKERS:  Recent Labs  12/13/16 0801  CHROMGRNA 15*    Assessment and Plan:  Rebekah LAMARCAa 83y.o.femalewith complex past medical history significant for bradycardia, paroxysmal atrial fibrillation, chronic pericarditis, CVA andcerebellar degeneration (patient is wheelchair bound and requires assistance for transferring), hypertension and hyperlipidemia, who has been diagnosed with metastatic neuroendocrine tumor of  the lung following  ultrasound-guided liver lesion biopsy on 08/20/2016.  While poorly tolerated (as detailed above), the patient is post successful Y 90 radioembolization of the right lobe of the liver on 12/13/2016 and radioembolization of the left lobe of the liver on 01/09/2017.    The most recent embolization was associated with the development of severe LEFT upper quadrant abdominal pain approximately 3 days following the embolization, lasting for approximately 2 weeks (at one point, necessitating presentation to the emergency department) however resolved nearly as quickly as it occured.  While I am uncertain whether this postprocedural left upper quadrant abdominal pain was related to the radioembolization, I am glad the patient's symptoms have resolved.  Of note, CT scan of the abdomen and pelvis performed on 01/13/2017 (4 days following completion embolization of the left lobe of the liver) demonstrated apparent decrease in size of bilateral hypoattenuating lesions, findings suggestive of response to therapy.  I do not believe the patient's ill-defined very subtle percutaneous rash is associated with nontarget embolization as I would expect nontarget embolization to result in skin ulceration and discomfort.  The patient will return to the interventional radiology clinic following the acquisition of dedicated surveillance post procedural CT scan in mid September of this year.  The patient was encouraged to call the interventional radiology clinic with any interval questions or concerns.   A copy of this report was sent to the requesting provider on this date.  Electronically Signed: Sandi Mariscal 02/05/2017, 11:25 AM   I spent a total of 15 Minutes in face to face in clinical consultation, greater than 50% of which was counseling/coordinating care for Post Y-90 radioembolization

## 2017-02-07 ENCOUNTER — Other Ambulatory Visit: Payer: Self-pay | Admitting: Pediatrics

## 2017-02-07 ENCOUNTER — Telehealth: Payer: Self-pay | Admitting: Cardiology

## 2017-02-07 DIAGNOSIS — E559 Vitamin D deficiency, unspecified: Secondary | ICD-10-CM

## 2017-02-07 NOTE — Telephone Encounter (Signed)
Spoke w/ pt daughter and requested that he send a manual transmission b/c his home monitor has not updated in at least 14 days.

## 2017-02-08 ENCOUNTER — Other Ambulatory Visit: Payer: Self-pay | Admitting: *Deleted

## 2017-02-08 MED ORDER — ONDANSETRON HCL 8 MG PO TABS: 8.0000 mg | ORAL_TABLET | Freq: Three times a day (TID) | ORAL | 0 refills | Status: DC | PRN

## 2017-02-08 NOTE — Telephone Encounter (Signed)
Last Vit D 07/17/16  19.2

## 2017-02-13 DIAGNOSIS — I480 Paroxysmal atrial fibrillation: Secondary | ICD-10-CM | POA: Diagnosis not present

## 2017-02-13 DIAGNOSIS — G319 Degenerative disease of nervous system, unspecified: Secondary | ICD-10-CM | POA: Diagnosis not present

## 2017-02-13 DIAGNOSIS — I10 Essential (primary) hypertension: Secondary | ICD-10-CM | POA: Diagnosis not present

## 2017-02-13 DIAGNOSIS — C801 Malignant (primary) neoplasm, unspecified: Secondary | ICD-10-CM | POA: Diagnosis not present

## 2017-02-13 DIAGNOSIS — C787 Secondary malignant neoplasm of liver and intrahepatic bile duct: Secondary | ICD-10-CM | POA: Diagnosis not present

## 2017-02-13 DIAGNOSIS — E669 Obesity, unspecified: Secondary | ICD-10-CM | POA: Diagnosis not present

## 2017-02-15 ENCOUNTER — Other Ambulatory Visit: Payer: Self-pay | Admitting: Pediatrics

## 2017-02-15 DIAGNOSIS — R569 Unspecified convulsions: Secondary | ICD-10-CM

## 2017-02-18 ENCOUNTER — Telehealth: Payer: Self-pay | Admitting: Internal Medicine

## 2017-02-18 ENCOUNTER — Ambulatory Visit (HOSPITAL_BASED_OUTPATIENT_CLINIC_OR_DEPARTMENT_OTHER): Payer: Medicare Other | Admitting: Internal Medicine

## 2017-02-18 ENCOUNTER — Other Ambulatory Visit (HOSPITAL_BASED_OUTPATIENT_CLINIC_OR_DEPARTMENT_OTHER): Payer: Medicare Other

## 2017-02-18 ENCOUNTER — Encounter: Payer: Self-pay | Admitting: Internal Medicine

## 2017-02-18 VITALS — BP 116/43 | HR 70 | Temp 98.4°F | Resp 17 | Ht 64.0 in | Wt 219.0 lb

## 2017-02-18 DIAGNOSIS — C7A8 Other malignant neuroendocrine tumors: Secondary | ICD-10-CM | POA: Diagnosis not present

## 2017-02-18 DIAGNOSIS — Z5111 Encounter for antineoplastic chemotherapy: Secondary | ICD-10-CM

## 2017-02-18 DIAGNOSIS — C7B8 Other secondary neuroendocrine tumors: Secondary | ICD-10-CM

## 2017-02-18 LAB — COMPREHENSIVE METABOLIC PANEL
ALBUMIN: 3.3 g/dL — AB (ref 3.5–5.0)
ALT: 42 U/L (ref 0–55)
AST: 47 U/L — ABNORMAL HIGH (ref 5–34)
Alkaline Phosphatase: 99 U/L (ref 40–150)
Anion Gap: 6 mEq/L (ref 3–11)
BUN: 8.1 mg/dL (ref 7.0–26.0)
CALCIUM: 10 mg/dL (ref 8.4–10.4)
CO2: 26 mEq/L (ref 22–29)
Chloride: 100 mEq/L (ref 98–109)
Creatinine: 0.8 mg/dL (ref 0.6–1.1)
EGFR: 67 mL/min/{1.73_m2} — AB (ref >90)
Glucose: 138 mg/dl (ref 70–140)
POTASSIUM: 4.6 meq/L (ref 3.5–5.1)
SODIUM: 132 meq/L — AB (ref 136–145)
Total Bilirubin: 0.69 mg/dL (ref 0.20–1.20)
Total Protein: 6.4 g/dL (ref 6.4–8.3)

## 2017-02-18 LAB — CBC WITH DIFFERENTIAL/PLATELET
BASO%: 0.7 % (ref 0.0–2.0)
BASOS ABS: 0 10*3/uL (ref 0.0–0.1)
EOS ABS: 0.2 10*3/uL (ref 0.0–0.5)
EOS%: 4 % (ref 0.0–7.0)
HEMATOCRIT: 40 % (ref 34.8–46.6)
HEMOGLOBIN: 13.1 g/dL (ref 11.6–15.9)
LYMPH%: 12.5 % — ABNORMAL LOW (ref 14.0–49.7)
MCH: 34 pg (ref 25.1–34.0)
MCHC: 32.8 g/dL (ref 31.5–36.0)
MCV: 103.7 fL — AB (ref 79.5–101.0)
MONO#: 0.5 10*3/uL (ref 0.1–0.9)
MONO%: 12.2 % (ref 0.0–14.0)
NEUT#: 2.9 10*3/uL (ref 1.5–6.5)
NEUT%: 70.6 % (ref 38.4–76.8)
Platelets: 119 10*3/uL — ABNORMAL LOW (ref 145–400)
RBC: 3.86 10*6/uL (ref 3.70–5.45)
RDW: 18.5 % — AB (ref 11.2–14.5)
WBC: 4.1 10*3/uL (ref 3.9–10.3)
lymph#: 0.5 10*3/uL — ABNORMAL LOW (ref 0.9–3.3)

## 2017-02-18 NOTE — Telephone Encounter (Signed)
Scheduled appt erp 7/23 los - Gave patient AVS and calender per los.

## 2017-02-18 NOTE — Progress Notes (Signed)
[place] [phone]   Fax:([phone]  OFFICE PROGRESS NOTE  [name], [place] [address]  DIAGNOSIS: Metastatic intermediate. Neuroendocrine tumor of lung primary diagnosed in January 2018 and presented with small bilateral pulmonary nodules in addition to multiple liver metastasis.  PRIOR THERAPY: Status post radio embolization with Y 90 to the liver lesions by interventional radiology.  CURRENT THERAPY: Xeloda 750 MG/M2 twice a day days 1-14 and Temodar 150 MG/M2 days 10-14 every 4 weeks. Status post 5 cycles. She is expected to start cycle #6 in 2 weeks.  INTERVAL HISTORY: [name] 83 y.o. female returns to the clinic today for follow-up visit accompanied by her husband. The patient is feeling fine today with no specific complaints except for mild fatigue. She continues to tolerate her treatment with Xeloda and Temodar fairly well. She completed the first 2 weeks of cycle #5 and she is currently off treatment for the next 2 weeks. She denied having any weight loss or night sweats. She has no nausea, vomiting, diarrhea or constipation. She denied having any fever or chills. She has no chest pain, shortness of breath, cough or hemoptysis. She is here today for evaluation and repeat blood work.   MEDICAL HISTORY: Past Medical History:  Diagnosis Date  . Acute respiratory failure with hypoxia ([place]) 10/23/2015  . Allergic rhinitis    PT. DENIES  . Anxiety   . Arthritis    NECK  . Ataxia   . Bradycardia    primarily nocturnal  . Burning tongue syndrome 25 years  . Cancer ([place]) dx'd 06/2016   liver  . Cataract   . Cerebellar degeneration   . Chronic pericarditis   . Chronic urinary tract infection   . Complication of anesthesia    low o2 sats, coded 30 years ago  . CVA (cerebral infarction) 05/2003  . Depression   . Encounter for antineoplastic chemotherapy 10/09/2016  . Gait disorder   . Gastric polyps     . GERD (gastroesophageal reflux disease)   . Goals of care, counseling/discussion 10/09/2016  . High cholesterol   . Hyperlipidemia   . Hypertension   . Hypertension   . Hypotension   . Hypothyroidism   . IBS (irritable bowel syndrome)   . Obesity   . Paroxysmal atrial fibrillation ([name])    chads2vasc score is 6,  she is felt to be a poor candidate for anticoagulation  . Personal history of arterial venous malformation (AVM)    right side of face  . Seizure disorder ([place])   . Seizures ([place]) 2003   " smelling"- Gabapentin "no problem"  . Shortness of breath dyspnea    with exertion  . Sternum fx 10/27/2013  . Stroke [facility]) 5 years ago   Right side of face weak, slurred speach-   . Thyroid disease   . TIA (transient ischemic attack)   . UTI (lower urinary tract infection) 03/27/2016   "frequently"    ALLERGIES:  is allergic to lisinopril.  MEDICATIONS:  Current Outpatient Prescriptions  Medication Sig Dispense Refill  . acetaminophen (TYLENOL) 500 MG tablet Take 500 mg by mouth every 6 (six) hours as needed.    Marland Kitchen amitriptyline (ELAVIL) 25 MG tablet Take 1 tablet (25 mg total) by mouth at bedtime. 90 tablet 1  . capecitabine (XELODA) 500 MG tablet Take 3 tablets (1,500 mg total) by mouth 2 (two) times daily after a meal. 84 tablet 2  .  escitalopram (LEXAPRO) 20 MG tablet Take 1 tablet (20 mg total) by mouth at bedtime. 90 tablet 1  . fluticasone (FLONASE) 50 MCG/ACT nasal spray Place 2 sprays into both nostrils at bedtime. 16 g 3  . gabapentin (NEURONTIN) 300 MG capsule TAKE 1 TO 2 CAPSULES FOUR TIMES A DAY 450 capsule 0  . hydroxypropyl methylcellulose / hypromellose (ISOPTO TEARS / GONIOVISC) 2.5 % ophthalmic solution Place 1 drop into both eyes at bedtime.    Marland Kitchen levothyroxine (SYNTHROID, LEVOTHROID) 75 MCG tablet TAKE 1 TABLET DAILY BEFORE BREAKFAST 90 tablet 1  . losartan (COZAAR) 50 MG tablet TAKE 1 TABLET DAILY 90 tablet 1  . lovastatin (MEVACOR) 40 MG tablet TAKE 1 TABLET  AT BEDTIME 90 tablet 1  . Melatonin 3 MG TBDP Take 3-6 mg by mouth at bedtime as needed. (Patient taking differently: Take 6 mg by mouth at bedtime as needed. ) 60 tablet 1  . omeprazole (PRILOSEC) 40 MG capsule TAKE 1 CAPSULE DAILY 90 capsule 1  . temozolomide (TEMODAR) 140 MG capsule Take 2 capsules (280 mg total) by mouth daily. May take on an empty stomach or at bedtime to decrease nausea & vomiting. 10 capsule 2  . Vitamin D, Ergocalciferol, (DRISDOL) 50000 units CAPS capsule TAKE 1 CAPSULE EVERY 7 DAYS 12 capsule 0  . ondansetron (ZOFRAN) 8 MG tablet Take 1 tablet (8 mg total) by mouth every 8 (eight) hours as needed for nausea or vomiting. (Patient not taking: Reported on 02/18/2017) 20 tablet 0  . oxyCODONE-acetaminophen (PERCOCET) 5-325 MG tablet Take 1-2 tablets by mouth every 4 (four) hours as needed. (Patient not taking: Reported on 02/18/2017) 15 tablet 0   No current facility-administered medications for this visit.     SURGICAL HISTORY:  Past Surgical History:  Procedure Laterality Date  . APPENDECTOMY  83 years old  . COLONOSCOPY  2006, 2009  . COLONOSCOPY WITH PROPOFOL N/A 03/28/2016   Procedure: COLONOSCOPY WITH PROPOFOL;  Surgeon: Gatha Mayer, MD;  Location: [facility];  Service: Endoscopy;  Laterality: N/A;  . cyst removed  35 years ago  . EP IMPLANTABLE DEVICE N/A 01/06/2015   Procedure: Loop Recorder Insertion;  Surgeon: Thompson Grayer, MD;  Location: [facility];  Service: Cardiovascular;  Laterality: N/A;  . EYE SURGERY Right    Cataract  . IR ANGIOGRAM SELECTIVE EACH ADDITIONAL VESSEL  11/28/2016  . IR ANGIOGRAM SELECTIVE EACH ADDITIONAL VESSEL  11/28/2016  . IR ANGIOGRAM SELECTIVE EACH ADDITIONAL VESSEL  11/28/2016  . IR ANGIOGRAM SELECTIVE EACH ADDITIONAL VESSEL  11/28/2016  . IR ANGIOGRAM SELECTIVE EACH ADDITIONAL VESSEL  12/13/2016  . IR ANGIOGRAM SELECTIVE EACH ADDITIONAL VESSEL  12/13/2016  . IR ANGIOGRAM SELECTIVE EACH ADDITIONAL VESSEL  01/09/2017  . IR  ANGIOGRAM VISCERAL SELECTIVE  11/28/2016  . IR ANGIOGRAM VISCERAL SELECTIVE  11/28/2016  . IR ANGIOGRAM VISCERAL SELECTIVE  12/13/2016  . IR ANGIOGRAM VISCERAL SELECTIVE  12/13/2016  . IR ANGIOGRAM VISCERAL SELECTIVE  01/09/2017  . IR EMBO ARTERIAL NOT HEMORR HEMANG INC GUIDE ROADMAPPING  11/28/2016  . IR EMBO TUMOR ORGAN ISCHEMIA INFARCT INC GUIDE ROADMAPPING  12/13/2016  . IR EMBO TUMOR ORGAN ISCHEMIA INFARCT INC GUIDE ROADMAPPING  01/09/2017  . IR RADIOLOGIST EVAL & MGMT  11/06/2016  . IR RADIOLOGIST EVAL & MGMT  01/02/2017  . IR US GUIDE VASC ACCESS RIGHT  11/28/2016  . IR US GUIDE VASC ACCESS RIGHT  12/13/2016  . IR US GUIDE VASC ACCESS RIGHT  01/09/2017  . KNEE ARTHROSCOPY Right  11/14/2006  . KNEE ARTHROSCOPY Bilateral 5 and 6 years ago  . KNEE ARTHROSCOPY WITH LATERAL MENISECTOMY  07/03/2012   Procedure: KNEE ARTHROSCOPY WITH LATERAL MENISECTOMY;  Surgeon: Magnus Sinning, MD;  Location: [facility];  Service: Orthopedics;  Laterality: Left;  with Partial Lateral Menisectomy and Medial Menisectomy. Shaving of medial and lateral femoral condyles. Shaving of patella. Removal of a loose body  . tibial and fibular internal fixation Left   . TOTAL ABDOMINAL HYSTERECTOMY  83 years old  . UPPER GASTROINTESTINAL ENDOSCOPY  2009, 2013    REVIEW OF SYSTEMS:  A comprehensive review of systems was negative except for: Constitutional: positive for fatigue   PHYSICAL EXAMINATION: General appearance: alert, cooperative, fatigued and no distress Head: Normocephalic, without obvious abnormality, atraumatic Neck: no adenopathy, no JVD, supple, symmetrical, trachea midline and thyroid not enlarged, symmetric, no tenderness/mass/nodules Lymph nodes: Cervical, supraclavicular, and axillary nodes normal. Resp: clear to auscultation bilaterally Back: symmetric, no curvature. ROM normal. No CVA tenderness. Cardio: regular rate and rhythm, S1, S2 normal, no murmur, click, rub or gallop GI: soft, non-tender; bowel sounds  normal; no masses,  no organomegaly Extremities: extremities normal, atraumatic, no cyanosis or edema  ECOG PERFORMANCE STATUS: 1 - Symptomatic but completely ambulatory  Blood pressure (!) 116/43, pulse 70, temperature 98.4 F (36.9 C), temperature source Oral, resp. rate 17, height 5\' 4"  (1.626 m), weight 219 lb (99.3 kg), SpO2 98 %.  LABORATORY DATA: Lab Results  Component Value Date   WBC 4.1 02/18/2017   HGB 13.1 02/18/2017   HCT 40.0 02/18/2017   MCV 103.7 (H) 02/18/2017   PLT 119 (L) 02/18/2017      Chemistry      Component Value Date/Time   NA 135 01/25/2017 1523   NA 139 12/05/2016 1049   K 5.0 01/25/2017 1523   K 4.5 12/05/2016 1049   CL 97 01/25/2017 1523   CO2 23 01/25/2017 1523   CO2 25 12/05/2016 1049   BUN 13 01/25/2017 1523   BUN 12.9 12/05/2016 1049   CREATININE 1.15 (H) 01/25/2017 1523   CREATININE 0.9 12/05/2016 1049      Component Value Date/Time   CALCIUM 10.3 01/25/2017 1523   CALCIUM 9.8 12/05/2016 1049   ALKPHOS 109 01/25/2017 1523   ALKPHOS 78 12/05/2016 1049   AST 31 01/25/2017 1523   AST 17 12/05/2016 1049   ALT 22 01/25/2017 1523   ALT 14 12/05/2016 1049   BILITOT 0.5 01/25/2017 1523   BILITOT 0.50 12/05/2016 1049       RADIOGRAPHIC STUDIES: No results found.  ASSESSMENT AND PLAN:  This is a white female with metastatic intermediate grade neuroendocrine carcinoma of questionable lung primary and multiple metastatic liver lesions and pancreatic lesions.She status post treatment with radio embolization with Y90 to the left and right lobe liver lesions. She is currently undergoing systemic chemotherapy with Xeloda and Temodar status post 5 cyclesAnd she is expected to start cycle #6 in 2 weeks. She also underwent treatment with Y 90 to the left and right lobe of the liver. I recommended for the patient to continue her treatment as scheduled. I will see her back for follow-up visit in 4 weeks for reevaluation and management of any  adverse effect of her treatment. The patient was advised to call immediately if she has any concerning symptoms in the interval. The patient voices understanding of current disease status and treatment options and is in agreement with the current care plan. All questions were answered. The patient  knows to call the clinic with any problems, questions or concerns. We can certainly see the patient much sooner if necessary. I spent 10 minutes counseling the patient face to face. The total time spent in the appointment was 15 minutes. Disclaimer: This note was dictated with voice recognition software. Similar sounding words can inadvertently be transcribed and may not be corrected upon review.

## 2017-02-20 ENCOUNTER — Telehealth: Payer: Self-pay | Admitting: Cardiology

## 2017-02-20 NOTE — Telephone Encounter (Signed)
Spoke w/ pt and requested that she send a manual transmission b/c her home monitor has not updated in at least 14 days.   

## 2017-02-25 ENCOUNTER — Ambulatory Visit (INDEPENDENT_AMBULATORY_CARE_PROVIDER_SITE_OTHER): Payer: Medicare Other | Admitting: *Deleted

## 2017-02-25 DIAGNOSIS — R002 Palpitations: Secondary | ICD-10-CM

## 2017-02-25 NOTE — Progress Notes (Signed)
Carelink Summary Report / Loop Recorder 

## 2017-02-26 ENCOUNTER — Ambulatory Visit (INDEPENDENT_AMBULATORY_CARE_PROVIDER_SITE_OTHER): Payer: Medicare Other | Admitting: Pediatrics

## 2017-02-26 DIAGNOSIS — G319 Degenerative disease of nervous system, unspecified: Secondary | ICD-10-CM | POA: Diagnosis not present

## 2017-02-26 DIAGNOSIS — C787 Secondary malignant neoplasm of liver and intrahepatic bile duct: Secondary | ICD-10-CM | POA: Diagnosis not present

## 2017-02-26 DIAGNOSIS — I10 Essential (primary) hypertension: Secondary | ICD-10-CM | POA: Diagnosis not present

## 2017-02-26 DIAGNOSIS — I480 Paroxysmal atrial fibrillation: Secondary | ICD-10-CM | POA: Diagnosis not present

## 2017-02-26 MED FILL — TEMOZOLOMIDE 140 MG CAPSULE: 140 | 5 days supply | Qty: 10 | Fill #2

## 2017-02-26 MED FILL — CAPECITABINE 500 MG TABLET: 500 | 14 days supply | Qty: 84 | Fill #2

## 2017-03-09 LAB — CUP PACEART REMOTE DEVICE CHECK
Date Time Interrogation Session: 20180729204018
MDC IDC PG IMPLANT DT: 20160609

## 2017-03-14 ENCOUNTER — Other Ambulatory Visit: Payer: Self-pay | Admitting: Internal Medicine

## 2017-03-18 DIAGNOSIS — R339 Retention of urine, unspecified: Secondary | ICD-10-CM | POA: Diagnosis not present

## 2017-03-19 ENCOUNTER — Telehealth: Payer: Self-pay | Admitting: Internal Medicine

## 2017-03-19 ENCOUNTER — Encounter: Payer: Self-pay | Admitting: Internal Medicine

## 2017-03-19 ENCOUNTER — Other Ambulatory Visit (HOSPITAL_BASED_OUTPATIENT_CLINIC_OR_DEPARTMENT_OTHER): Payer: Medicare Other

## 2017-03-19 ENCOUNTER — Ambulatory Visit (HOSPITAL_BASED_OUTPATIENT_CLINIC_OR_DEPARTMENT_OTHER): Payer: Medicare Other | Admitting: Internal Medicine

## 2017-03-19 VITALS — BP 137/100 | HR 73 | Temp 98.2°F | Resp 18 | Ht 64.0 in | Wt 223.7 lb

## 2017-03-19 DIAGNOSIS — C7A8 Other malignant neuroendocrine tumors: Secondary | ICD-10-CM

## 2017-03-19 DIAGNOSIS — Z5111 Encounter for antineoplastic chemotherapy: Secondary | ICD-10-CM

## 2017-03-19 DIAGNOSIS — C787 Secondary malignant neoplasm of liver and intrahepatic bile duct: Secondary | ICD-10-CM

## 2017-03-19 DIAGNOSIS — C7B8 Other secondary neuroendocrine tumors: Secondary | ICD-10-CM | POA: Diagnosis not present

## 2017-03-19 DIAGNOSIS — I10 Essential (primary) hypertension: Secondary | ICD-10-CM

## 2017-03-19 LAB — CBC WITH DIFFERENTIAL/PLATELET
BASO%: 1.3 % (ref 0.0–2.0)
BASOS ABS: 0.1 10*3/uL (ref 0.0–0.1)
EOS%: 4.3 % (ref 0.0–7.0)
Eosinophils Absolute: 0.2 10*3/uL (ref 0.0–0.5)
HEMATOCRIT: 38.1 % (ref 34.8–46.6)
HGB: 12.8 g/dL (ref 11.6–15.9)
LYMPH%: 15.4 % (ref 14.0–49.7)
MCH: 35.1 pg — ABNORMAL HIGH (ref 25.1–34.0)
MCHC: 33.6 g/dL (ref 31.5–36.0)
MCV: 104.6 fL — ABNORMAL HIGH (ref 79.5–101.0)
MONO#: 0.7 10*3/uL (ref 0.1–0.9)
MONO%: 14.7 % — ABNORMAL HIGH (ref 0.0–14.0)
NEUT#: 3 10*3/uL (ref 1.5–6.5)
NEUT%: 64.3 % (ref 38.4–76.8)
Platelets: 119 10*3/uL — ABNORMAL LOW (ref 145–400)
RBC: 3.64 10*6/uL — ABNORMAL LOW (ref 3.70–5.45)
RDW: 18.9 % — ABNORMAL HIGH (ref 11.2–14.5)
WBC: 4.7 10*3/uL (ref 3.9–10.3)
lymph#: 0.7 10*3/uL — ABNORMAL LOW (ref 0.9–3.3)

## 2017-03-19 LAB — COMPREHENSIVE METABOLIC PANEL
ALT: 54 U/L (ref 0–55)
AST: 59 U/L — ABNORMAL HIGH (ref 5–34)
Albumin: 3.3 g/dL — ABNORMAL LOW (ref 3.5–5.0)
Alkaline Phosphatase: 118 U/L (ref 40–150)
Anion Gap: 5 mEq/L (ref 3–11)
BUN: 10.6 mg/dL (ref 7.0–26.0)
CALCIUM: 10.1 mg/dL (ref 8.4–10.4)
CHLORIDE: 102 meq/L (ref 98–109)
CO2: 29 meq/L (ref 22–29)
Creatinine: 0.8 mg/dL (ref 0.6–1.1)
EGFR: 65 mL/min/{1.73_m2} — ABNORMAL LOW (ref >90)
Glucose: 90 mg/dl (ref 70–140)
Potassium: 4.2 mEq/L (ref 3.5–5.1)
Sodium: 135 mEq/L — ABNORMAL LOW (ref 136–145)
Total Bilirubin: 0.57 mg/dL (ref 0.20–1.20)
Total Protein: 6.6 g/dL (ref 6.4–8.3)

## 2017-03-19 LAB — LACTATE DEHYDROGENASE: LDH: 214 U/L (ref 125–245)

## 2017-03-19 MED ORDER — TEMOZOLOMIDE 140 MG PO CAPS: 150.0000 mg/m2/d | ORAL_CAPSULE | Freq: Every day | ORAL | 2 refills | Status: DC

## 2017-03-19 MED ORDER — ONDANSETRON HCL 8 MG PO TABS: 8.0000 mg | ORAL_TABLET | Freq: Three times a day (TID) | ORAL | 0 refills | Status: DC | PRN

## 2017-03-19 MED ORDER — CAPECITABINE 500 MG PO TABS: 750.0000 mg/m2 | ORAL_TABLET | Freq: Two times a day (BID) | ORAL | 2 refills | Status: DC

## 2017-03-19 MED FILL — ONDANSETRON HCL 8 MG TAB: 8 | 7 days supply | Qty: 20 | Fill #0

## 2017-03-19 MED FILL — CAPECITABINE 500 MG TABLET: 500 | 14 days supply | Qty: 84 | Fill #0

## 2017-03-19 NOTE — Telephone Encounter (Signed)
Gave pt avs and calendar for upcoming appts.

## 2017-03-19 NOTE — Progress Notes (Signed)
[place] [phone]   [phone]) [phone]  OFFICE PROGRESS NOTE  [place], [place] [address]  DIAGNOSIS: Metastatic intermediate. Neuroendocrine tumor of lung primary diagnosed in January 2018 and presented with small bilateral pulmonary nodules in addition to multiple liver metastasis.  PRIOR THERAPY: Status post radio embolization with Y 90 to the liver lesions by interventional radiology.  CURRENT THERAPY: Xeloda 750 MG/M2 twice a day days 1-14 and Temodar 150 MG/M2 days 10-14 every 4 weeks. Status post 6 cycles. She is expected to start cycle #7 on 03/31/2017.  INTERVAL HISTORY: [name] 83 y.o. female returns to the clinic today for follow-up visit accompanied by her husband. The patient is feeling fine today with no specific complaints except for mild fatigue. She continues to tolerate her treatment with Xeloda and Temodar fairly well. She had one or 2 episodes of nausea improved with Zofran. She denied having any recent weight loss or night sweats. She has no nausea, vomiting, diarrhea or constipation. The patient denied having any chest pain, shortness of breath, cough or hemoptysis. She is here today for evaluation and repeat blood work.  MEDICAL HISTORY: Past Medical History:  Diagnosis Date  . Acute respiratory failure with hypoxia ([place]) 10/23/2015  . Allergic rhinitis    PT. DENIES  . Anxiety   . Arthritis    NECK  . Ataxia   . Bradycardia    primarily nocturnal  . Burning tongue syndrome 25 years  . Cancer ([place]) dx'd 06/2016   liver  . Cataract   . Cerebellar degeneration   . Chronic pericarditis   . Chronic urinary tract infection   . Complication of anesthesia    low o2 sats, coded 30 years ago  . CVA (cerebral infarction) 05/2003  . Depression   . Encounter for antineoplastic chemotherapy 10/09/2016  . Gait disorder   . Gastric polyps   . GERD (gastroesophageal reflux disease)   . Goals of  care, counseling/discussion 10/09/2016  . High cholesterol   . Hyperlipidemia   . Hypertension   . Hypertension   . Hypotension   . Hypothyroidism   . IBS (irritable bowel syndrome)   . Obesity   . Paroxysmal atrial fibrillation ([name])    chads2vasc score is 6,  she is felt to be a poor candidate for anticoagulation  . Personal history of arterial venous malformation (AVM)    right side of face  . Seizure disorder ([place])   . Seizures ([place]) 2003   " smelling"- Gabapentin "no problem"  . Shortness of breath dyspnea    with exertion  . Sternum fx 10/27/2013  . Stroke [facility]) 5 years ago   Right side of face weak, slurred speach-   . Thyroid disease   . TIA (transient ischemic attack)   . UTI (lower urinary tract infection) 03/27/2016   "frequently"    ALLERGIES:  is allergic to lisinopril.  MEDICATIONS:  Current Outpatient Prescriptions  Medication Sig Dispense Refill  . acetaminophen (TYLENOL) 500 MG tablet Take 500 mg by mouth every 6 (six) hours as needed.    Marland Kitchen amitriptyline (ELAVIL) 25 MG tablet Take 1 tablet (25 mg total) by mouth at bedtime. 90 tablet 1  . escitalopram (LEXAPRO) 20 MG tablet Take 1 tablet (20 mg total) by mouth at bedtime. 90 tablet 1  . fluticasone (FLONASE) 50 MCG/ACT nasal spray Place 2 sprays into both nostrils at bedtime. 16 g 3  .  gabapentin (NEURONTIN) 300 MG capsule TAKE 1 TO 2 CAPSULES FOUR TIMES A DAY 450 capsule 0  . hydroxypropyl methylcellulose / hypromellose (ISOPTO TEARS / GONIOVISC) 2.5 % ophthalmic solution Place 1 drop into both eyes at bedtime.    Marland Kitchen levothyroxine (SYNTHROID, LEVOTHROID) 75 MCG tablet TAKE 1 TABLET DAILY BEFORE BREAKFAST 90 tablet 1  . losartan (COZAAR) 50 MG tablet TAKE 1 TABLET DAILY 90 tablet 1  . lovastatin (MEVACOR) 40 MG tablet TAKE 1 TABLET AT BEDTIME 90 tablet 1  . Melatonin 3 MG TBDP Take 3-6 mg by mouth at bedtime as needed. (Patient taking differently: Take 6 mg by mouth at bedtime as needed. ) 60 tablet 1  .  nitrofurantoin (MACRODANTIN) 50 MG capsule Take 80 mg by mouth daily.    Marland Kitchen omeprazole (PRILOSEC) 40 MG capsule TAKE 1 CAPSULE DAILY 90 capsule 1  . ondansetron (ZOFRAN) 8 MG tablet Take 1 tablet (8 mg total) by mouth every 8 (eight) hours as needed for nausea or vomiting. 20 tablet 0  . Vitamin D, Ergocalciferol, (DRISDOL) 50000 units CAPS capsule TAKE 1 CAPSULE EVERY 7 DAYS 12 capsule 0  . capecitabine (XELODA) 500 MG tablet Take 3 tablets (1,500 mg total) by mouth 2 (two) times daily after a meal. (Patient not taking: Reported on 03/19/2017) 84 tablet 2  . oxyCODONE-acetaminophen (PERCOCET) 5-325 MG tablet Take 1-2 tablets by mouth every 4 (four) hours as needed. (Patient not taking: Reported on 02/18/2017) 15 tablet 0  . temozolomide (TEMODAR) 140 MG capsule Take 2 capsules (280 mg total) by mouth daily. May take on an empty stomach or at bedtime to decrease nausea & vomiting. (Patient not taking: Reported on 03/19/2017) 10 capsule 2   No current facility-administered medications for this visit.     SURGICAL HISTORY:  Past Surgical History:  Procedure Laterality Date  . APPENDECTOMY  83 years old  . COLONOSCOPY  2006, 2009  . COLONOSCOPY WITH PROPOFOL N/A 03/28/2016   Procedure: COLONOSCOPY WITH PROPOFOL;  Surgeon: Gatha Mayer, MD;  Location: [facility];  Service: Endoscopy;  Laterality: N/A;  . cyst removed  35 years ago  . EP IMPLANTABLE DEVICE N/A 01/06/2015   Procedure: Loop Recorder Insertion;  Surgeon: Thompson Grayer, MD;  Location: [facility];  Service: Cardiovascular;  Laterality: N/A;  . EYE SURGERY Right    Cataract  . IR ANGIOGRAM SELECTIVE EACH ADDITIONAL VESSEL  11/28/2016  . IR ANGIOGRAM SELECTIVE EACH ADDITIONAL VESSEL  11/28/2016  . IR ANGIOGRAM SELECTIVE EACH ADDITIONAL VESSEL  11/28/2016  . IR ANGIOGRAM SELECTIVE EACH ADDITIONAL VESSEL  11/28/2016  . IR ANGIOGRAM SELECTIVE EACH ADDITIONAL VESSEL  12/13/2016  . IR ANGIOGRAM SELECTIVE EACH ADDITIONAL VESSEL  12/13/2016  .  IR ANGIOGRAM SELECTIVE EACH ADDITIONAL VESSEL  01/09/2017  . IR ANGIOGRAM VISCERAL SELECTIVE  11/28/2016  . IR ANGIOGRAM VISCERAL SELECTIVE  11/28/2016  . IR ANGIOGRAM VISCERAL SELECTIVE  12/13/2016  . IR ANGIOGRAM VISCERAL SELECTIVE  12/13/2016  . IR ANGIOGRAM VISCERAL SELECTIVE  01/09/2017  . IR EMBO ARTERIAL NOT HEMORR HEMANG INC GUIDE ROADMAPPING  11/28/2016  . IR EMBO TUMOR ORGAN ISCHEMIA INFARCT INC GUIDE ROADMAPPING  12/13/2016  . IR EMBO TUMOR ORGAN ISCHEMIA INFARCT INC GUIDE ROADMAPPING  01/09/2017  . IR RADIOLOGIST EVAL & MGMT  11/06/2016  . IR RADIOLOGIST EVAL & MGMT  01/02/2017  . IR US GUIDE VASC ACCESS RIGHT  11/28/2016  . IR US GUIDE VASC ACCESS RIGHT  12/13/2016  . IR US GUIDE VASC ACCESS RIGHT  01/09/2017  . KNEE ARTHROSCOPY Right 11/14/2006  . KNEE ARTHROSCOPY Bilateral 5 and 6 years ago  . KNEE ARTHROSCOPY WITH LATERAL MENISECTOMY  07/03/2012   Procedure: KNEE ARTHROSCOPY WITH LATERAL MENISECTOMY;  Surgeon: Magnus Sinning, MD;  Location[facility];  Service: Orthopedics;  Laterality: Left;  with Partial Lateral Menisectomy and Medial Menisectomy. Shaving of medial and lateral femoral condyles. Shaving of patella. Removal of a loose body  . tibial and fibular internal fixation Left   . TOTAL ABDOMINAL HYSTERECTOMY  83 years old  . UPPER GASTROINTESTINAL ENDOSCOPY  2009, 2013    REVIEW OF SYSTEMS:  A comprehensive review of systems was negative except for: Constitutional: positive for fatigue   PHYSICAL EXAMINATION: General appearance: alert, cooperative, fatigued and no distress Head: Normocephalic, without obvious abnormality, atraumatic Neck: no adenopathy, no JVD, supple, symmetrical, trachea midline and thyroid not enlarged, symmetric, no tenderness/mass/nodules Lymph nodes: Cervical, supraclavicular, and axillary nodes normal. Resp: clear to auscultation bilaterally Back: symmetric, no curvature. ROM normal. No CVA tenderness. Cardio: regular rate and rhythm, S1, S2 normal, no  murmur, click, rub or gallop GI: soft, non-tender; bowel sounds normal; no masses,  no organomegaly Extremities: extremities normal, atraumatic, no cyanosis or edema  ECOG PERFORMANCE STATUS: 1 - Symptomatic but completely ambulatory  Blood pressure (!) 137/100, pulse 73, temperature 98.2 F (36.8 C), temperature source Oral, resp. rate 18, height 5\' 4"  (1.626 m), weight 223 lb 11.2 oz (101.5 kg), SpO2 99 %.  LABORATORY DATA: Lab Results  Component Value Date   WBC 4.7 03/19/2017   HGB 12.8 03/19/2017   HCT 38.1 03/19/2017   MCV 104.6 (H) 03/19/2017   PLT 119 (L) 03/19/2017      Chemistry      Component Value Date/Time   NA 135 (L) 03/19/2017 1102   K 4.2 03/19/2017 1102   CL 97 01/25/2017 1523   CO2 29 03/19/2017 1102   BUN 10.6 03/19/2017 1102   CREATININE 0.8 03/19/2017 1102      Component Value Date/Time   CALCIUM 10.1 03/19/2017 1102   ALKPHOS 118 03/19/2017 1102   AST 59 (H) 03/19/2017 1102   ALT 54 03/19/2017 1102   BILITOT 0.57 03/19/2017 1102       RADIOGRAPHIC STUDIES: No results found.  ASSESSMENT AND PLAN:  This is a white female with metastatic intermediate grade neuroendocrine carcinoma of questionable lung primary and multiple metastatic liver lesions and pancreatic lesions.She status post treatment with radio embolization with Y90 to the left and right lobe liver lesions.Status post treatment with Y 90 to the left and right lobes of the liver. She is currently undergoing systemic chemotherapy with Xeloda and Temodar status post 6 cycles. The patient continues to tolerate her treatment fairly well. I recommended for the patient to continue her treatment as scheduled. I would see her back for follow-up visit in one month's for evaluation after repeating CT scan of the chest, abdomen and pelvis for restaging of her disease. She was advised to call immediately if she has any concerning symptoms in the interval. The patient voices understanding of current  disease status and treatment options and is in agreement with the current care plan. All questions were answered. The patient knows to call the clinic with any problems, questions or concerns. We can certainly see the patient much sooner if necessary. I spent 10 minutes counseling the patient face to face. The total time spent in the appointment was 15 minutes. Disclaimer: This note was dictated with voice recognition  software. Similar sounding words can inadvertently be transcribed and may not be corrected upon review.

## 2017-03-20 ENCOUNTER — Telehealth: Payer: Self-pay | Admitting: Cardiology

## 2017-03-20 MED FILL — TEMOZOLOMIDE 140 MG CAPSULE: 140 | 5 days supply | Qty: 10 | Fill #0

## 2017-03-20 NOTE — Telephone Encounter (Signed)
LMOVM requesting that pt send manual transmission b/c home monitor has not updated in at least 14 days.    

## 2017-03-26 ENCOUNTER — Other Ambulatory Visit (HOSPITAL_COMMUNITY): Payer: Self-pay | Admitting: Interventional Radiology

## 2017-03-26 ENCOUNTER — Ambulatory Visit (INDEPENDENT_AMBULATORY_CARE_PROVIDER_SITE_OTHER): Payer: Medicare Other | Admitting: *Deleted

## 2017-03-26 DIAGNOSIS — R001 Bradycardia, unspecified: Secondary | ICD-10-CM

## 2017-03-26 DIAGNOSIS — C787 Secondary malignant neoplasm of liver and intrahepatic bile duct: Secondary | ICD-10-CM

## 2017-03-27 ENCOUNTER — Other Ambulatory Visit: Payer: Self-pay | Admitting: Pediatrics

## 2017-03-27 DIAGNOSIS — K146 Glossodynia: Secondary | ICD-10-CM

## 2017-03-27 LAB — CUP PACEART REMOTE DEVICE CHECK
Date Time Interrogation Session: 20180828214853
MDC IDC PG IMPLANT DT: 20160609

## 2017-03-27 NOTE — Progress Notes (Signed)
Loop Recorder Summary Report 

## 2017-04-17 ENCOUNTER — Telehealth: Payer: Self-pay | Admitting: Cardiology

## 2017-04-17 NOTE — Telephone Encounter (Signed)
Spoke w/ pt and requested that she send a manual transmission b/c her home monitor has not updated in at least 14 days.   

## 2017-04-18 MED FILL — TEMOZOLOMIDE 140 MG CAPSULE: 140 | 5 days supply | Qty: 10 | Fill #1

## 2017-04-18 MED FILL — CAPECITABINE 500 MG TABLET: 500 | 14 days supply | Qty: 84 | Fill #1

## 2017-04-23 ENCOUNTER — Telehealth: Payer: Self-pay | Admitting: Hematology and Oncology

## 2017-04-23 ENCOUNTER — Ambulatory Visit (HOSPITAL_COMMUNITY)
Admission: RE | Admit: 2017-04-23 | Discharge: 2017-04-23 | Disposition: A | Discharge status: Home / Self Care | Payer: Medicare Other | Source: Ambulatory Visit | Attending: Internal Medicine | Admitting: Internal Medicine

## 2017-04-23 ENCOUNTER — Other Ambulatory Visit: Payer: Self-pay | Admitting: Pediatrics

## 2017-04-23 ENCOUNTER — Encounter: Payer: Self-pay | Admitting: Hematology and Oncology

## 2017-04-23 ENCOUNTER — Inpatient Hospital Stay (HOSPITAL_COMMUNITY)
Admission: EM | Admit: 2017-04-23 | Discharge: 2017-04-25 | DRG: 167 | Disposition: A | Discharge status: Home / Self Care | Payer: Medicare Other | Attending: Internal Medicine | Admitting: Internal Medicine

## 2017-04-23 ENCOUNTER — Encounter (HOSPITAL_COMMUNITY): Payer: Self-pay | Admitting: Emergency Medicine

## 2017-04-23 ENCOUNTER — Other Ambulatory Visit (HOSPITAL_BASED_OUTPATIENT_CLINIC_OR_DEPARTMENT_OTHER): Payer: Medicare Other

## 2017-04-23 DIAGNOSIS — E785 Hyperlipidemia, unspecified: Secondary | ICD-10-CM

## 2017-04-23 DIAGNOSIS — E7800 Pure hypercholesterolemia, unspecified: Secondary | ICD-10-CM | POA: Diagnosis present

## 2017-04-23 DIAGNOSIS — M479 Spondylosis, unspecified: Secondary | ICD-10-CM | POA: Diagnosis present

## 2017-04-23 DIAGNOSIS — Z8673 Personal history of transient ischemic attack (TIA), and cerebral infarction without residual deficits: Secondary | ICD-10-CM

## 2017-04-23 DIAGNOSIS — Z23 Encounter for immunization: Secondary | ICD-10-CM | POA: Diagnosis not present

## 2017-04-23 DIAGNOSIS — G40909 Epilepsy, unspecified, not intractable, without status epilepticus: Secondary | ICD-10-CM | POA: Diagnosis present

## 2017-04-23 DIAGNOSIS — Z90710 Acquired absence of both cervix and uterus: Secondary | ICD-10-CM

## 2017-04-23 DIAGNOSIS — N200 Calculus of kidney: Secondary | ICD-10-CM | POA: Diagnosis not present

## 2017-04-23 DIAGNOSIS — K219 Gastro-esophageal reflux disease without esophagitis: Secondary | ICD-10-CM

## 2017-04-23 DIAGNOSIS — G319 Degenerative disease of nervous system, unspecified: Secondary | ICD-10-CM | POA: Diagnosis present

## 2017-04-23 DIAGNOSIS — C7B02 Secondary carcinoid tumors of liver: Secondary | ICD-10-CM | POA: Diagnosis not present

## 2017-04-23 DIAGNOSIS — F329 Major depressive disorder, single episode, unspecified: Secondary | ICD-10-CM | POA: Diagnosis not present

## 2017-04-23 DIAGNOSIS — Z888 Allergy status to other drugs, medicaments and biological substances status: Secondary | ICD-10-CM

## 2017-04-23 DIAGNOSIS — I2699 Other pulmonary embolism without acute cor pulmonale: Secondary | ICD-10-CM | POA: Diagnosis not present

## 2017-04-23 DIAGNOSIS — R001 Bradycardia, unspecified: Secondary | ICD-10-CM | POA: Diagnosis present

## 2017-04-23 DIAGNOSIS — Z87891 Personal history of nicotine dependence: Secondary | ICD-10-CM

## 2017-04-23 DIAGNOSIS — C7B8 Other secondary neuroendocrine tumors: Secondary | ICD-10-CM

## 2017-04-23 DIAGNOSIS — Z8249 Family history of ischemic heart disease and other diseases of the circulatory system: Secondary | ICD-10-CM

## 2017-04-23 DIAGNOSIS — F32A Depression, unspecified: Secondary | ICD-10-CM | POA: Diagnosis present

## 2017-04-23 DIAGNOSIS — E039 Hypothyroidism, unspecified: Secondary | ICD-10-CM | POA: Diagnosis present

## 2017-04-23 DIAGNOSIS — I2609 Other pulmonary embolism with acute cor pulmonale: Secondary | ICD-10-CM | POA: Diagnosis not present

## 2017-04-23 DIAGNOSIS — C7A8 Other malignant neuroendocrine tumors: Secondary | ICD-10-CM | POA: Diagnosis not present

## 2017-04-23 DIAGNOSIS — I10 Essential (primary) hypertension: Secondary | ICD-10-CM | POA: Diagnosis not present

## 2017-04-23 DIAGNOSIS — Z5111 Encounter for antineoplastic chemotherapy: Secondary | ICD-10-CM

## 2017-04-23 DIAGNOSIS — I480 Paroxysmal atrial fibrillation: Secondary | ICD-10-CM | POA: Diagnosis present

## 2017-04-23 DIAGNOSIS — Z9221 Personal history of antineoplastic chemotherapy: Secondary | ICD-10-CM

## 2017-04-23 DIAGNOSIS — Z993 Dependence on wheelchair: Secondary | ICD-10-CM

## 2017-04-23 DIAGNOSIS — Z8589 Personal history of malignant neoplasm of other organs and systems: Secondary | ICD-10-CM

## 2017-04-23 DIAGNOSIS — Z833 Family history of diabetes mellitus: Secondary | ICD-10-CM

## 2017-04-23 DIAGNOSIS — Z8042 Family history of malignant neoplasm of prostate: Secondary | ICD-10-CM

## 2017-04-23 DIAGNOSIS — C787 Secondary malignant neoplasm of liver and intrahepatic bile duct: Secondary | ICD-10-CM

## 2017-04-23 DIAGNOSIS — C3490 Malignant neoplasm of unspecified part of unspecified bronchus or lung: Secondary | ICD-10-CM | POA: Diagnosis not present

## 2017-04-23 DIAGNOSIS — R0602 Shortness of breath: Secondary | ICD-10-CM | POA: Diagnosis not present

## 2017-04-23 LAB — BASIC METABOLIC PANEL
Anion gap: 7 (ref 5–15)
BUN: 13 mg/dL (ref 6–20)
CALCIUM: 10 mg/dL (ref 8.9–10.3)
CO2: 27 mmol/L (ref 22–32)
CREATININE: 0.97 mg/dL (ref 0.44–1.00)
Chloride: 105 mmol/L (ref 101–111)
GFR calc Af Amer: >60 mL/min (ref >60)
GFR, EST NON AFRICAN AMERICAN: 53 mL/min — AB (ref >60)
Glucose, Bld: 118 mg/dL — ABNORMAL HIGH (ref 65–99)
Potassium: 4.8 mmol/L (ref 3.5–5.1)
SODIUM: 139 mmol/L (ref 135–145)

## 2017-04-23 LAB — CBC WITH DIFFERENTIAL/PLATELET
BASO%: 0.8 % (ref 0.0–2.0)
BASOS PCT: 0 %
Basophils Absolute: 0 10*3/uL (ref 0.0–0.1)
Basophils Absolute: 0 10*3/uL (ref 0.0–0.1)
EOS ABS: 0.3 10*3/uL (ref 0.0–0.5)
EOS ABS: 0.4 10*3/uL (ref 0.0–0.7)
EOS PCT: 6 %
EOS%: 5.7 % (ref 0.0–7.0)
HCT: 38.1 % (ref 34.8–46.6)
HCT: 36.5 % (ref 36.0–46.0)
HEMOGLOBIN: 12.9 g/dL (ref 11.6–15.9)
Hemoglobin: 12.2 g/dL (ref 12.0–15.0)
LYMPH%: 17.6 % (ref 14.0–49.7)
Lymphocytes Relative: 16 %
Lymphs Abs: 1 10*3/uL (ref 0.7–4.0)
MCH: 35.8 pg — ABNORMAL HIGH (ref 25.1–34.0)
MCH: 34.2 pg — ABNORMAL HIGH (ref 26.0–34.0)
MCHC: 33.8 g/dL (ref 31.5–36.0)
MCHC: 33.4 g/dL (ref 30.0–36.0)
MCV: 105.8 fL — ABNORMAL HIGH (ref 79.5–101.0)
MCV: 102.2 fL — ABNORMAL HIGH (ref 78.0–100.0)
MONO ABS: 1.1 10*3/uL — AB (ref 0.1–1.0)
MONO#: 0.9 10*3/uL (ref 0.1–0.9)
MONO%: 16.8 % — AB (ref 0.0–14.0)
MONOS PCT: 17 %
NEUT%: 59.1 % (ref 38.4–76.8)
NEUTROS ABS: 3.2 10*3/uL (ref 1.5–6.5)
Neutro Abs: 3.8 10*3/uL (ref 1.7–7.7)
Neutrophils Relative %: 61 %
PLATELETS: 138 10*3/uL — AB (ref 150–400)
Platelets: 130 10*3/uL — ABNORMAL LOW (ref 145–400)
RBC: 3.6 10*6/uL — ABNORMAL LOW (ref 3.70–5.45)
RBC: 3.57 MIL/uL — ABNORMAL LOW (ref 3.87–5.11)
RDW: 18 % — AB (ref 11.2–14.5)
RDW: 16.6 % — AB (ref 11.5–15.5)
WBC: 5.4 10*3/uL (ref 3.9–10.3)
WBC: 6.3 10*3/uL (ref 4.0–10.5)
lymph#: 0.9 10*3/uL (ref 0.9–3.3)

## 2017-04-23 LAB — APTT: aPTT: 27 seconds (ref 24–36)

## 2017-04-23 LAB — COMPREHENSIVE METABOLIC PANEL
ALBUMIN: 3.5 g/dL (ref 3.5–5.0)
ALK PHOS: 117 U/L (ref 40–150)
ALT: 21 U/L (ref 0–55)
AST: 35 U/L — AB (ref 5–34)
Anion Gap: 5 mEq/L (ref 3–11)
BILIRUBIN TOTAL: 0.69 mg/dL (ref 0.20–1.20)
BUN: 13.8 mg/dL (ref 7.0–26.0)
CO2: 27 mEq/L (ref 22–29)
Calcium: 10.4 mg/dL (ref 8.4–10.4)
Chloride: 104 mEq/L (ref 98–109)
Creatinine: 1 mg/dL (ref 0.6–1.1)
EGFR: 53 mL/min/{1.73_m2} — ABNORMAL LOW (ref >90)
GLUCOSE: 100 mg/dL (ref 70–140)
Potassium: 5.2 mEq/L — ABNORMAL HIGH (ref 3.5–5.1)
SODIUM: 136 meq/L (ref 136–145)
TOTAL PROTEIN: 7.1 g/dL (ref 6.4–8.3)

## 2017-04-23 LAB — PROTIME-INR
INR: 1.07
Prothrombin Time: 13.8 seconds (ref 11.4–15.2)

## 2017-04-23 LAB — I-STAT TROPONIN, ED: TROPONIN I, POC: 0 ng/mL (ref 0.00–0.08)

## 2017-04-23 MED ORDER — ACETAMINOPHEN 325 MG PO TABS
650.0000 mg | ORAL_TABLET | Freq: Four times a day (QID) | ORAL | Status: DC | PRN
  Administered 2017-04-25: 650 mg via ORAL
  Filled 2017-04-23: qty 2

## 2017-04-23 MED ORDER — MELATONIN 3 MG PO TBDP: 6.0000 mg | ORAL_TABLET | Freq: Every evening | ORAL | Status: DC | PRN

## 2017-04-23 MED ORDER — IPRATROPIUM-ALBUTEROL 0.5-2.5 (3) MG/3ML IN SOLN: 3.0000 mL | RESPIRATORY_TRACT | Status: DC | PRN

## 2017-04-23 MED ORDER — NITROFURANTOIN MACROCRYSTAL 50 MG PO CAPS
50.0000 mg | ORAL_CAPSULE | Freq: Every day | ORAL | Status: DC
  Administered 2017-04-24: 50 mg via ORAL
  Filled 2017-04-23: qty 1

## 2017-04-23 MED ORDER — AMITRIPTYLINE HCL 25 MG PO TABS
25.0000 mg | ORAL_TABLET | Freq: Every day | ORAL | Status: DC
  Administered 2017-04-24: 25 mg via ORAL
  Filled 2017-04-23: qty 1

## 2017-04-23 MED ORDER — LEVOTHYROXINE SODIUM 75 MCG PO TABS
75.0000 ug | ORAL_TABLET | Freq: Every day | ORAL | Status: DC
  Administered 2017-04-25: 75 ug via ORAL
  Filled 2017-04-23: qty 1

## 2017-04-23 MED ORDER — HEPARIN (PORCINE) IN NACL 100-0.45 UNIT/ML-% IJ SOLN
1250.0000 [IU]/h | INTRAMUSCULAR | Status: DC
  Filled 2017-04-23: qty 250

## 2017-04-23 MED ORDER — ONDANSETRON HCL 4 MG/2ML IJ SOLN: 4.0000 mg | Freq: Four times a day (QID) | INTRAMUSCULAR | Status: DC | PRN

## 2017-04-23 MED ORDER — ENOXAPARIN SODIUM 40 MG/0.4ML ~~LOC~~ SOLN: 40.0000 mg | SUBCUTANEOUS | Status: DC

## 2017-04-23 MED ORDER — IOPAMIDOL (ISOVUE-300) INJECTION 61%
INTRAVENOUS | Status: AC
  Filled 2017-04-23: qty 100

## 2017-04-23 MED ORDER — ESCITALOPRAM OXALATE 20 MG PO TABS
20.0000 mg | ORAL_TABLET | Freq: Every day | ORAL | Status: DC
  Administered 2017-04-24: 20 mg via ORAL
  Filled 2017-04-23: qty 1

## 2017-04-23 MED ORDER — HEPARIN (PORCINE) IN NACL 100-0.45 UNIT/ML-% IJ SOLN
1200.0000 [IU]/h | INTRAMUSCULAR | Status: DC
  Administered 2017-04-23: 1200 [IU]/h via INTRAVENOUS
  Filled 2017-04-23: qty 250

## 2017-04-23 MED ORDER — HEPARIN BOLUS VIA INFUSION
3000.0000 [IU] | Freq: Once | INTRAVENOUS | Status: DC
  Filled 2017-04-23: qty 3000

## 2017-04-23 MED ORDER — ACETAMINOPHEN 650 MG RE SUPP: 650.0000 mg | Freq: Four times a day (QID) | RECTAL | Status: DC | PRN

## 2017-04-23 MED ORDER — LOSARTAN POTASSIUM 50 MG PO TABS
50.0000 mg | ORAL_TABLET | Freq: Every day | ORAL | Status: DC
  Administered 2017-04-24 – 2017-04-25 (×2): 50 mg via ORAL
  Filled 2017-04-23 (×2): qty 1

## 2017-04-23 MED ORDER — ONDANSETRON HCL 4 MG PO TABS: 8.0000 mg | ORAL_TABLET | Freq: Three times a day (TID) | ORAL | Status: DC | PRN

## 2017-04-23 MED ORDER — SODIUM CHLORIDE 0.9 % IV SOLN
INTRAVENOUS | Status: DC
  Administered 2017-04-24: 01:00:00 via INTRAVENOUS

## 2017-04-23 MED ORDER — POLYVINYL ALCOHOL 1.4 % OP SOLN
1.0000 [drp] | Freq: Every day | OPHTHALMIC | Status: DC
  Administered 2017-04-24 (×2): 1 [drp] via OPHTHALMIC
  Filled 2017-04-23: qty 15

## 2017-04-23 MED ORDER — PRAVASTATIN SODIUM 40 MG PO TABS: 40.0000 mg | ORAL_TABLET | Freq: Every day | ORAL | Status: DC

## 2017-04-23 MED ORDER — PANTOPRAZOLE SODIUM 40 MG PO TBEC
40.0000 mg | DELAYED_RELEASE_TABLET | Freq: Every day | ORAL | Status: DC
  Administered 2017-04-24 – 2017-04-25 (×2): 40 mg via ORAL
  Filled 2017-04-23 (×2): qty 1

## 2017-04-23 MED ORDER — IOPAMIDOL (ISOVUE-300) INJECTION 61%
100.0000 mL | Freq: Once | INTRAVENOUS | Status: AC | PRN
  Administered 2017-04-23: 100 mL via INTRAVENOUS

## 2017-04-23 MED ORDER — ONDANSETRON HCL 4 MG PO TABS: 4.0000 mg | ORAL_TABLET | Freq: Four times a day (QID) | ORAL | Status: DC | PRN

## 2017-04-23 MED ORDER — GABAPENTIN 300 MG PO CAPS: 300.0000 mg | ORAL_CAPSULE | Freq: Four times a day (QID) | ORAL | Status: DC

## 2017-04-23 NOTE — ED Notes (Signed)
Pt states she is from the cancer center and was told to come to be admitted for a blood clot in her lungs. Vital signs are stable. Pt is currently undergoing radiation (last of which was 3 months ago) and takes chemo pills at home. Pt denies SOB and denies CP. Pt states she has been having anxiety and panic attacks at home, but denies symptoms currently.

## 2017-04-23 NOTE — Progress Notes (Addendum)
ANTICOAGULATION CONSULT NOTE - Initial Consult  Pharmacy Consult for Heparin Indication: pulmonary embolus  Allergies  Allergen Reactions  . Lisinopril Cough    Patient Measurements: Height: 5\' 4"  (162.6 cm) Weight: 220 lb (99.8 kg) IBW/kg (Calculated) : 54.7 Heparin Dosing Weight: 77 kg  Vital Signs: Temp: 98 F (36.7 C) (09/25 1954) Temp Source: Oral (09/25 1954) BP: 133/58 (09/25 1954) Pulse Rate: 74 (09/25 1954)  Labs:  Recent Labs  04/23/17 1241  HGB 12.9  HCT 38.1  PLT 130*  CREATININE 1.0    Estimated Creatinine Clearance: 50.6 mL/min (by C-G formula based on SCr of 1 mg/dL).   Medical History: Past Medical History:  Diagnosis Date  . Acute respiratory failure with hypoxia ([place]) 10/23/2015  . Allergic rhinitis    PT. DENIES  . Anxiety   . Arthritis    NECK  . Ataxia   . Bradycardia    primarily nocturnal  . Burning tongue syndrome 25 years  . Cancer ([place]) dx'd 06/2016   liver  . Cataract   . Cerebellar degeneration   . Chronic pericarditis   . Chronic urinary tract infection   . Complication of anesthesia    low o2 sats, coded 30 years ago  . CVA (cerebral infarction) 05/2003  . Depression   . Encounter for antineoplastic chemotherapy 10/09/2016  . Gait disorder   . Gastric polyps   . GERD (gastroesophageal reflux disease)   . Goals of care, counseling/discussion 10/09/2016  . High cholesterol   . Hyperlipidemia   . Hypertension   . Hypertension   . Hypotension   . Hypothyroidism   . IBS (irritable bowel syndrome)   . Obesity   . Paroxysmal atrial fibrillation ([name])    chads2vasc score is 6,  she is felt to be a poor candidate for anticoagulation  . Personal history of arterial venous malformation (AVM)    right side of face  . Seizure disorder ([place])   . Seizures ([place]) 2003   " smelling"- Gabapentin "no problem"  . Shortness of breath dyspnea    with exertion  . Sternum fx 10/27/2013  . Stroke [facility]) 5 years ago   Right side of  face weak, slurred speach-   . Thyroid disease   . TIA (transient ischemic attack)   . UTI (lower urinary tract infection) 03/27/2016   "frequently"    Assessment:  83 yr female with metastatic cancer (taking oral chemotherapy) sent to ED with pulmonary embolism.   Pharmacy consulted to dose IV heparin  Noted episode of epistaxis a week ago and history of bleeding episodes; spoke with MD who requested no heparin bolus  Goal of Therapy:  Heparin level 0.3-0.7 units/ml Monitor platelets by anticoagulation protocol: Yes   Plan:   Baseline aPTT, INR, CBC pending  Heparin infusion @ 1200 units/hr (No bolus due to episode of epistaxis a week ago and history of bleeding episodes)  Check heparin level 8 hr after heparin started  Check CBC and heparin level daily while on heparin   [name], Toribio Harbour, PharmD 04/23/2017,8:53 PM

## 2017-04-23 NOTE — Telephone Encounter (Signed)
I received a telephone call from radiologist regarding her CT scan of the chest that was done today for evaluation of metastatic cancer The CT imaging studies showed evidence of blood clots in the lung The age of the blood clot is indeterminate I called and spoke with the patient over the telephone The patient had history of recurrent bleeding in the past She her recent bleeding episode consists of epistaxis a week ago She has been complaining of bilateral leg edema for the past week She was prescribed diuretic therapy without improvement  I suspect she may have undiagnosed DVT that might have caused pulmonary emboli The patient is symptomatic with sensation of chest pain/shortness of breath over the past week  She told me "absolutely not" that she would not be able to take blood thinner whatsoever for blood clots In this case, the only way we can resolve this would be consideration for IVC filter placement I recommend she goes to the [facility] emergency department to be admitted for further evaluation for lower extremity DVT and possible IVC filter placement She expressed understanding

## 2017-04-23 NOTE — ED Notes (Signed)
After detailed conversation with [name] and explaining that the use of heparin is prevent further formation of clots in her lungs while her body reclaims the present clot she was agreeable with restarting the drug IV heparin. She was also asked about her current DNR status and she stated she wants everything done should her heart stopped or should she stop breathing.

## 2017-04-23 NOTE — ED Provider Notes (Signed)
[facility] DEPT Provider Note   CSN: [id number] Arrival date & time: 04/23/17  1918     History   Chief Complaint Chief Complaint  Patient presents with  . pulmonary embolism    HPI [name] is a 83 y.o. female history of neuroendocrine tumor, previous stroke, wheelchair-bound here presenting with pulmonary embolus. Patient has chronic shortness of breath and when to follow-up with her oncologist and had a CT chest abdomen pelvis to survey her cancer. On her outpatient CT, there was right pulmonary embolus. Patient was sent here for IVC filter and anticoagulation. She states that she has a previous AVM on her right face and was told that she couldn't get blood thinners.   The history is provided by the patient.    Past Medical History:  Diagnosis Date  . Acute respiratory failure with hypoxia ([place]) 10/23/2015  . Allergic rhinitis    PT. DENIES  . Anxiety   . Arthritis    NECK  . Ataxia   . Bradycardia    primarily nocturnal  . Burning tongue syndrome 25 years  . Cancer ([place]) dx'd 06/2016   liver  . Cataract   . Cerebellar degeneration   . Chronic pericarditis   . Chronic urinary tract infection   . Complication of anesthesia    low o2 sats, coded 30 years ago  . CVA (cerebral infarction) 05/2003  . Depression   . Encounter for antineoplastic chemotherapy 10/09/2016  . Gait disorder   . Gastric polyps   . GERD (gastroesophageal reflux disease)   . Goals of care, counseling/discussion 10/09/2016  . High cholesterol   . Hyperlipidemia   . Hypertension   . Hypertension   . Hypotension   . Hypothyroidism   . IBS (irritable bowel syndrome)   . Obesity   . Paroxysmal atrial fibrillation ([name])    chads2vasc score is 6,  she is felt to be a poor candidate for anticoagulation  . Personal history of arterial venous malformation (AVM)    right side of face  . Seizure disorder ([place])   . Seizures ([place]) 2003   " smelling"- Gabapentin "no problem"  . Shortness  of breath dyspnea    with exertion  . Sternum fx 10/27/2013  . Stroke [facility]) 5 years ago   Right side of face weak, slurred speach-   . Thyroid disease   . TIA (transient ischemic attack)   . UTI (lower urinary tract infection) 03/27/2016   "frequently"    Patient Active Problem List   Diagnosis Date Noted  . Neuroendocrine carcinoma metastatic to liver ([place]) 12/13/2016  . Back pain 12/13/2016  . Chest pain 12/13/2016  . Goals of care, counseling/discussion 10/09/2016  . Encounter for antineoplastic chemotherapy 10/09/2016  . Neuroendocrine cancer ([place]) 08/23/2016  . Liver metastases ([place]) 08/23/2016  . Late effect of cerebrovascular accident (CVA) 08/02/2016  . H/O: CVA (cerebrovascular accident) 07/14/2016  . Meningioma ([place]) 07/14/2016  . Asthmatic bronchitis 10/23/2015  . GAD (generalized anxiety disorder) 09/30/2015  . Peripheral edema 09/30/2015  . Insomnia 09/30/2015  . Localization-related partial epilepsy with simple partial seizures ([place]) 06/27/2015  . Allergic rhinitis 05/24/2015  . Syncope 01/06/2015  . Sinus bradycardia 01/03/2015  . Paroxysmal atrial fibrillation ([place]) 01/03/2015  . Fatigue 01/03/2015  . Morbid obesity ([place]) 01/03/2015  . Solitary pulmonary nodule 06/02/2014  . Thyroid nodule 05/08/2014  . Palpitation 05/07/2014  . Depression   . Seizure disorder ([place])   . Hypothyroidism   . Cerebellar degeneration   .  IRRITABLE BOWEL SYNDROME 02/18/2008  . Hyperlipemia 09/10/2006  . Essential hypertension 09/10/2006  . GERD 09/10/2006  . CVA (cerebral infarction) 05/31/2003    Past Surgical History:  Procedure Laterality Date  . APPENDECTOMY  83 years old  . COLONOSCOPY  2006, 2009  . COLONOSCOPY WITH PROPOFOL N/A 03/28/2016   Procedure: COLONOSCOPY WITH PROPOFOL;  Surgeon: Gatha Mayer, MD;  Location: [facility];  Service: Endoscopy;  Laterality: N/A;  . cyst removed  35 years ago  . EP IMPLANTABLE DEVICE N/A 01/06/2015   Procedure: Loop Recorder  Insertion;  Surgeon: Thompson Grayer, MD;  Location: [facility];  Service: Cardiovascular;  Laterality: N/A;  . EYE SURGERY Right    Cataract  . IR ANGIOGRAM SELECTIVE EACH ADDITIONAL VESSEL  11/28/2016  . IR ANGIOGRAM SELECTIVE EACH ADDITIONAL VESSEL  11/28/2016  . IR ANGIOGRAM SELECTIVE EACH ADDITIONAL VESSEL  11/28/2016  . IR ANGIOGRAM SELECTIVE EACH ADDITIONAL VESSEL  11/28/2016  . IR ANGIOGRAM SELECTIVE EACH ADDITIONAL VESSEL  12/13/2016  . IR ANGIOGRAM SELECTIVE EACH ADDITIONAL VESSEL  12/13/2016  . IR ANGIOGRAM SELECTIVE EACH ADDITIONAL VESSEL  01/09/2017  . IR ANGIOGRAM VISCERAL SELECTIVE  11/28/2016  . IR ANGIOGRAM VISCERAL SELECTIVE  11/28/2016  . IR ANGIOGRAM VISCERAL SELECTIVE  12/13/2016  . IR ANGIOGRAM VISCERAL SELECTIVE  12/13/2016  . IR ANGIOGRAM VISCERAL SELECTIVE  01/09/2017  . IR EMBO ARTERIAL NOT HEMORR HEMANG INC GUIDE ROADMAPPING  11/28/2016  . IR EMBO TUMOR ORGAN ISCHEMIA INFARCT INC GUIDE ROADMAPPING  12/13/2016  . IR EMBO TUMOR ORGAN ISCHEMIA INFARCT INC GUIDE ROADMAPPING  01/09/2017  . IR RADIOLOGIST EVAL & MGMT  11/06/2016  . IR RADIOLOGIST EVAL & MGMT  01/02/2017  . IR US GUIDE VASC ACCESS RIGHT  11/28/2016  . IR US GUIDE VASC ACCESS RIGHT  12/13/2016  . IR US GUIDE VASC ACCESS RIGHT  01/09/2017  . KNEE ARTHROSCOPY Right 11/14/2006  . KNEE ARTHROSCOPY Bilateral 5 and 6 years ago  . KNEE ARTHROSCOPY WITH LATERAL MENISECTOMY  07/03/2012   Procedure: KNEE ARTHROSCOPY WITH LATERAL MENISECTOMY;  Surgeon: Magnus Sinning, MD;  Location: [facility];  Service: Orthopedics;  Laterality: Left;  with Partial Lateral Menisectomy and Medial Menisectomy. Shaving of medial and lateral femoral condyles. Shaving of patella. Removal of a loose body  . tibial and fibular internal fixation Left   . TOTAL ABDOMINAL HYSTERECTOMY  82 years old  . UPPER GASTROINTESTINAL ENDOSCOPY  2009, 2013    OB History    No data available       Home Medications    Prior to Admission medications   Medication Sig  Start Date End Date Taking? Authorizing Provider  acetaminophen (TYLENOL) 500 MG tablet Take 1,000 mg by mouth every 6 (six) hours as needed.    Yes [provider]  amitriptyline (ELAVIL) 25 MG tablet TAKE 1 TABLET AT BEDTIME 03/28/17  Yes Eustaquio Maize, MD  capecitabine (XELODA) 500 MG tablet Take 3 tablets (1,500 mg total) by mouth 2 (two) times daily after a meal. 03/19/17  Yes Curt Bears, MD  escitalopram (LEXAPRO) 20 MG tablet Take 1 tablet (20 mg total) by mouth at bedtime. 10/01/16  Yes Eustaquio Maize, MD  gabapentin (NEURONTIN) 300 MG capsule TAKE 1 TO 2 CAPSULES FOUR TIMES A DAY 02/15/17  Yes Eustaquio Maize, MD  hydroxypropyl methylcellulose / hypromellose (ISOPTO TEARS / GONIOVISC) 2.5 % ophthalmic solution Place 1 drop into both eyes at bedtime.   Yes [provider]  levothyroxine (SYNTHROID,  LEVOTHROID) 75 MCG tablet TAKE 1 TABLET DAILY BEFORE BREAKFAST 12/25/16  Yes Eustaquio Maize, MD  losartan (COZAAR) 50 MG tablet TAKE 1 TABLET DAILY 01/14/17  Yes Eustaquio Maize, MD  lovastatin (MEVACOR) 40 MG tablet TAKE 1 TABLET AT BEDTIME 04/23/17  Yes Eustaquio Maize, MD  Melatonin 3 MG TBDP Take 3-6 mg by mouth at bedtime as needed. Patient taking differently: Take 6 mg by mouth at bedtime as needed.  08/02/16  Yes Eustaquio Maize, MD  nitrofurantoin (MACRODANTIN) 50 MG capsule Take 50 mg by mouth at bedtime.  03/18/17  Yes [provider]  omeprazole (PRILOSEC) 40 MG capsule TAKE 1 CAPSULE DAILY 04/23/17  Yes Eustaquio Maize, MD  ondansetron (ZOFRAN) 8 MG tablet Take 1 tablet (8 mg total) by mouth every 8 (eight) hours as needed for nausea or vomiting. 03/19/17  Yes Curt Bears, MD  temozolomide (TEMODAR) 140 MG capsule Take 2 capsules (280 mg total) by mouth daily. May take on an empty stomach or at bedtime to decrease nausea & vomiting. 03/19/17  Yes Curt Bears, MD  Vitamin D, Ergocalciferol, (DRISDOL) 50000 units CAPS capsule TAKE 1 CAPSULE EVERY 7  DAYS 02/08/17  Yes Eustaquio Maize, MD  fluticasone (FLONASE) 50 MCG/ACT nasal spray Place 2 sprays into both nostrils at bedtime. Patient not taking: Reported on 04/23/2017 09/20/16   Eustaquio Maize, MD  oxyCODONE-acetaminophen (PERCOCET) 5-325 MG tablet Take 1-2 tablets by mouth every 4 (four) hours as needed. Patient not taking: Reported on 02/18/2017 01/13/17   Drenda Freeze, MD    Family History Family History  Problem Relation Age of Onset  . Heart attack Father 13       fatal  . Coronary artery disease Brother   . Diabetes Brother   . Prostate cancer Brother   . Prostate cancer Son     Social History Social History  Substance Use Topics  . Smoking status: Former Smoker    Packs/day: 0.25    Years: 10.00    Types: Cigarettes    Quit date: 07/31/1975  . Smokeless tobacco: Never Used  . Alcohol use No     Comment: Rare- maybe a drink ever 2 years     Allergies   Lisinopril   Review of Systems Review of Systems  Respiratory: Positive for shortness of breath.   All other systems reviewed and are negative.    Physical Exam Updated Vital Signs BP (!) 133/58 (BP Location: Left Arm)   Pulse 74   Temp 98 F (36.7 C) (Oral)   Resp 20   Ht 5\' 4"  (1.626 m)   Wt 99.8 kg (220 lb)   SpO2 96%   BMI 37.76 kg/m   Physical Exam  Constitutional: She is oriented to person, place, and time.  Chronically ill   HENT:  Head: Normocephalic.  Eyes: Pupils are equal, round, and reactive to light. Conjunctivae and EOM are normal.  Neck: Normal range of motion.  Cardiovascular: Normal rate, regular rhythm and normal heart sounds.   Pulmonary/Chest: Effort normal and breath sounds normal.  Abdominal: Soft. Bowel sounds are normal.  Musculoskeletal: Normal range of motion. She exhibits no edema.  Bilateral leg swelling, no obvious calf tenderness   Neurological: She is alert and oriented to person, place, and time.  Skin: Skin is warm.  Psychiatric: She has a normal mood  and affect.  Nursing note and vitals reviewed.    ED Treatments / Results  Labs (all labs ordered  are listed, but only abnormal results are displayed) Labs Reviewed  CBC WITH DIFFERENTIAL/PLATELET  BASIC METABOLIC PANEL  PROTIME-INR  APTT  I-STAT TROPONIN, ED    EKG  EKG Interpretation None       Radiology Ct Chest W Contrast  Result Date: 04/23/2017 CLINICAL DATA:  Followup lung cancer. EXAM: CT CHEST, ABDOMEN, AND PELVIS WITH CONTRAST TECHNIQUE: Multidetector CT imaging of the chest, abdomen and pelvis was performed following the standard protocol during bolus administration of intravenous contrast. CONTRAST:  114mL ISOVUE-300 IOPAMIDOL (ISOVUE-300) INJECTION 61% COMPARISON:  CT abdomen and pelvis from 01/13/2017 and PET-CT from 10/02/2016. FINDINGS: CT CHEST FINDINGS Cardiovascular: The heart size is normal. Aortic atherosclerosis. Calcification within the LAD coronary artery identified. Filling defects within the right lower lobe pulmonary artery is identified compatible with pulmonary embolus, age indeterminate. Mediastinum/Nodes: The trachea appears patent and is midline. Normal appearance of the esophagus. No enlarged mediastinal or hilar lymph nodes. No supraclavicular adenopathy. Lungs/Pleura: No pleural effusion. Pulmonary nodule within the anteromedial right upper lobe measures 1.1 cm, image 41 of series 4. Unchanged from previous exam. There is a tiny nodule in the right upper lobe measuring 3 mm. New from previous exam. Anteromedial left upper lobe lung nodule is also new measuring 4 mm, image 22 of series 4. Within the left upper lobe there is a nodule measuring 4 mm, image 44 of series 4. New from previous exam. Musculoskeletal: No aggressive lytic or sclerotic bone lesions identified. CT ABDOMEN PELVIS FINDINGS Hepatobiliary: Lateral segment of left lobe of liver lesion measures 2.1 cm, image 39 of series 5. On the previous examination this measure 2.3 cm. Second lesion  within the lateral segment of left lobe of liver measures 1.5 cm, image 46 of series 5. Previously this measured the same. Lesion within the lateral aspect of the right lobe of liver measures 2.1 cm, image 65 of series 5. Previously 2.4 cm. No new liver lesions identified. The gallbladder appears normal. Pancreas: Normal appearance of the pancreas. Spleen: The spleen is unremarkable. Adrenals/Urinary Tract: The adrenal glands are normal. Bilateral renal cortical scarring identified. There is a small stone within the inferior pole of the left kidney, image 79 of series 5. There are a few small low-density foci within the right kidney which are too small to reliably characterize. Stomach/Bowel: The stomach is normal. The small bowel loops have a normal course and caliber. No bowel obstruction. Normal appearance of the colon. Vascular/Lymphatic: Aortic atherosclerosis. No aneurysm. No abdominal or pelvic adenopathy. No inguinal adenopathy. Reproductive: Status post hysterectomy. No adnexal masses. Other: No abdominal wall hernia or abnormality. No abdominopelvic ascites. Musculoskeletal: Degenerative disc disease noted within the lumbar spine. No aggressive lytic or sclerotic bone lesions. IMPRESSION: 1. Stable appearance of multifocal liver lesions. 2. The dominant pulmonary nodule within the right upper lobe is not significantly changed from 10/02/2016. There are a few scattered tiny subcentimeter pulmonary nodules which were not confidently identified on previous imaging of the chest. 3. Filling defects within the right lower lobe pulmonary arteries identified compatible with age indeterminate pulmonary emboli. Critical Value/emergent results were called by telephone at the time of interpretation on 04/23/2017 at 5:11 pm to Dr. Alvy Bimler , who verbally acknowledged these results. 4. Aortic Atherosclerosis (ICD10-I70.0). LAD coronary artery calcifications noted. 5. Nonobstructing left renal calculus. Electronically  Signed   By: Kerby Moors M.D.   On: 04/23/2017 17:11   Ct Abdomen Pelvis W Contrast  Result Date: 04/23/2017 CLINICAL DATA:  Followup lung cancer. EXAM: CT  CHEST, ABDOMEN, AND PELVIS WITH CONTRAST TECHNIQUE: Multidetector CT imaging of the chest, abdomen and pelvis was performed following the standard protocol during bolus administration of intravenous contrast. CONTRAST:  149mL ISOVUE-300 IOPAMIDOL (ISOVUE-300) INJECTION 61% COMPARISON:  CT abdomen and pelvis from 01/13/2017 and PET-CT from 10/02/2016. FINDINGS: CT CHEST FINDINGS Cardiovascular: The heart size is normal. Aortic atherosclerosis. Calcification within the LAD coronary artery identified. Filling defects within the right lower lobe pulmonary artery is identified compatible with pulmonary embolus, age indeterminate. Mediastinum/Nodes: The trachea appears patent and is midline. Normal appearance of the esophagus. No enlarged mediastinal or hilar lymph nodes. No supraclavicular adenopathy. Lungs/Pleura: No pleural effusion. Pulmonary nodule within the anteromedial right upper lobe measures 1.1 cm, image 41 of series 4. Unchanged from previous exam. There is a tiny nodule in the right upper lobe measuring 3 mm. New from previous exam. Anteromedial left upper lobe lung nodule is also new measuring 4 mm, image 22 of series 4. Within the left upper lobe there is a nodule measuring 4 mm, image 44 of series 4. New from previous exam. Musculoskeletal: No aggressive lytic or sclerotic bone lesions identified. CT ABDOMEN PELVIS FINDINGS Hepatobiliary: Lateral segment of left lobe of liver lesion measures 2.1 cm, image 39 of series 5. On the previous examination this measure 2.3 cm. Second lesion within the lateral segment of left lobe of liver measures 1.5 cm, image 46 of series 5. Previously this measured the same. Lesion within the lateral aspect of the right lobe of liver measures 2.1 cm, image 65 of series 5. Previously 2.4 cm. No new liver lesions  identified. The gallbladder appears normal. Pancreas: Normal appearance of the pancreas. Spleen: The spleen is unremarkable. Adrenals/Urinary Tract: The adrenal glands are normal. Bilateral renal cortical scarring identified. There is a small stone within the inferior pole of the left kidney, image 79 of series 5. There are a few small low-density foci within the right kidney which are too small to reliably characterize. Stomach/Bowel: The stomach is normal. The small bowel loops have a normal course and caliber. No bowel obstruction. Normal appearance of the colon. Vascular/Lymphatic: Aortic atherosclerosis. No aneurysm. No abdominal or pelvic adenopathy. No inguinal adenopathy. Reproductive: Status post hysterectomy. No adnexal masses. Other: No abdominal wall hernia or abnormality. No abdominopelvic ascites. Musculoskeletal: Degenerative disc disease noted within the lumbar spine. No aggressive lytic or sclerotic bone lesions. IMPRESSION: 1. Stable appearance of multifocal liver lesions. 2. The dominant pulmonary nodule within the right upper lobe is not significantly changed from 10/02/2016. There are a few scattered tiny subcentimeter pulmonary nodules which were not confidently identified on previous imaging of the chest. 3. Filling defects within the right lower lobe pulmonary arteries identified compatible with age indeterminate pulmonary emboli. Critical Value/emergent results were called by telephone at the time of interpretation on 04/23/2017 at 5:11 pm to Dr. Alvy Bimler , who verbally acknowledged these results. 4. Aortic Atherosclerosis (ICD10-I70.0). LAD coronary artery calcifications noted. 5. Nonobstructing left renal calculus. Electronically Signed   By: Kerby Moors M.D.   On: 04/23/2017 17:11    Procedures Procedures (including critical care time)  CRITICAL CARE Performed by: Wandra Arthurs   Total critical care time: 30 minutes  Critical care time was exclusive of separately billable  procedures and treating other patients.  Critical care was necessary to treat or prevent imminent or life-threatening deterioration.  Critical care was time spent personally by me on the following activities: development of treatment plan with patient and/or surrogate as well as  nursing, discussions with consultants, evaluation of patient's response to treatment, examination of patient, obtaining history from patient or surrogate, ordering and performing treatments and interventions, ordering and review of laboratory studies, ordering and review of radiographic studies, pulse oximetry and re-evaluation of patient's condition.   Medications Ordered in ED Medications - No data to display   Initial Impression / Assessment and Plan / ED Course  I have reviewed the triage vital signs and the nursing notes.  Pertinent labs & imaging results that were available during my care of the patient were reviewed by me and considered in my medical decision making (see chart for details).     [name] is a 83 y.o. female here with shortness of breath, + PE on CT earlier today. She has AVM and doesn't want to start long term anticoagulation. I told her that we should give IV heparin while we wait for IVC filter tomorrow. Also will get bilateral dopplers while in the hospital. Hospitalist to admit.     Final Clinical Impressions(s) / ED Diagnoses   Final diagnoses:  None    New Prescriptions New Prescriptions   No medications on file     Drenda Freeze, MD 04/23/17 [id number]

## 2017-04-23 NOTE — H&P (Signed)
History and Physical    [name] JQB:[id number] DOB: 03-26-1934 DOA: 04/23/2017  Referring MD/NP/PA: Dr. Darl Householder PCP: Eustaquio Maize, MD  Patient coming from: Home  Chief Complaint: Abnormal CT scan  HPI: [name] is a 83 y.o. female with medical history significant of HTN, neuroendocrine tumor of the lung with multiple liver metastases on chemotherapy followed by Dr. Earlie Server, hypothyroidism, seizure disorder, cerebellar degeneration with ataxia, PAF not on anticoagulation; who presents after having routine CT scans of her chest and abdomen for evaluation of her cancer. CT revealed a filling defect of the right lower lung suggestive of pulmonary embolus. Patient was sent here for need of IVC filter. Patient has history of AVM diagnosed in 9s which she was advised not to ever be placed on blood thinners long-term. Further investigation of the last few weeks patient reports new leg swelling, complaints of chest and back pain self resolved 1 week ago, nosebleed couple days ago, leg cramps,  and feeling more short of breath than normal. Patient initially thought that she was having panic attacks when she was having her shortness of breath symptoms. Patient reports that she is to restart therapy at the end of this month.  ED Course:  Upon admission into the emergency department patient was seen to be afebrile with O2 saturations 96% on room air, and all other vital signs within normal limits. Labs revealed WBC 5.4, hemoglobin 12.9, platelets 130, potassium now 4.8 previously 5.2 on lab work taken earlier in the day. Patient was initially started on heparin drip but in further discussions with the patient she felt uncomfortable being on blood thinners and requested that it be stopped. Discussed risks of either and patient reaffirmed that she wanted heparin gtt stopped. She okayed the use of subcutaneous DVT prophylaxis dose Lovenox.  Patient subsequently decided that she wanted be on  heparin   Review of Systems  Constitutional: Positive for malaise/fatigue. Negative for diaphoresis and weight loss.  HENT: Positive for nosebleeds. Negative for ear discharge.   Eyes: Negative for photophobia and pain.  Respiratory: Positive for shortness of breath. Negative for hemoptysis.   Cardiovascular: Positive for chest pain and leg swelling. Negative for palpitations.  Gastrointestinal: Negative for abdominal pain and constipation.  Genitourinary: Negative for frequency and urgency.  Musculoskeletal: Positive for myalgias. Negative for falls.  Skin: Negative for itching and rash.  Neurological: Negative for speech change and focal weakness.  Psychiatric/Behavioral: Negative for substance abuse. The patient is not nervous/anxious.     Past Medical History:  Diagnosis Date  . Acute respiratory failure with hypoxia ([place]) 10/23/2015  . Allergic rhinitis    PT. DENIES  . Anxiety   . Arthritis    NECK  . Ataxia   . Bradycardia    primarily nocturnal  . Burning tongue syndrome 25 years  . Cancer ([place]) dx'd 06/2016   liver  . Cataract   . Cerebellar degeneration   . Chronic pericarditis   . Chronic urinary tract infection   . Complication of anesthesia    low o2 sats, coded 30 years ago  . CVA (cerebral infarction) 05/2003  . Depression   . Encounter for antineoplastic chemotherapy 10/09/2016  . Gait disorder   . Gastric polyps   . GERD (gastroesophageal reflux disease)   . Goals of care, counseling/discussion 10/09/2016  . High cholesterol   . Hyperlipidemia   . Hypertension   . Hypertension   . Hypotension   . Hypothyroidism   . IBS (irritable  bowel syndrome)   . Obesity   . Paroxysmal atrial fibrillation ([name])    chads2vasc score is 6,  she is felt to be a poor candidate for anticoagulation  . Personal history of arterial venous malformation (AVM)    right side of face  . Seizure disorder ([place])   . Seizures ([place]) 2003   " smelling"- Gabapentin "no problem"   . Shortness of breath dyspnea    with exertion  . Sternum fx 10/27/2013  . Stroke [facility]) 5 years ago   Right side of face weak, slurred speach-   . Thyroid disease   . TIA (transient ischemic attack)   . UTI (lower urinary tract infection) 03/27/2016   "frequently"    Past Surgical History:  Procedure Laterality Date  . APPENDECTOMY  83 years old  . COLONOSCOPY  2006, 2009  . COLONOSCOPY WITH PROPOFOL N/A 03/28/2016   Procedure: COLONOSCOPY WITH PROPOFOL;  Surgeon: Gatha Mayer, MD;  Location: [place] [facility];  Service: Endoscopy;  Laterality: N/A;  . cyst removed  35 years ago  . EP IMPLANTABLE DEVICE N/A 01/06/2015   Procedure: Loop Recorder Insertion;  Surgeon: Thompson Grayer, MD;  Location: [place] [facility];  Service: Cardiovascular;  Laterality: N/A;  . EYE SURGERY Right    Cataract  . IR ANGIOGRAM SELECTIVE EACH ADDITIONAL VESSEL  11/28/2016  . IR ANGIOGRAM SELECTIVE EACH ADDITIONAL VESSEL  11/28/2016  . IR ANGIOGRAM SELECTIVE EACH ADDITIONAL VESSEL  11/28/2016  . IR ANGIOGRAM SELECTIVE EACH ADDITIONAL VESSEL  11/28/2016  . IR ANGIOGRAM SELECTIVE EACH ADDITIONAL VESSEL  12/13/2016  . IR ANGIOGRAM SELECTIVE EACH ADDITIONAL VESSEL  12/13/2016  . IR ANGIOGRAM SELECTIVE EACH ADDITIONAL VESSEL  01/09/2017  . IR ANGIOGRAM VISCERAL SELECTIVE  11/28/2016  . IR ANGIOGRAM VISCERAL SELECTIVE  11/28/2016  . IR ANGIOGRAM VISCERAL SELECTIVE  12/13/2016  . IR ANGIOGRAM VISCERAL SELECTIVE  12/13/2016  . IR ANGIOGRAM VISCERAL SELECTIVE  01/09/2017  . IR EMBO ARTERIAL NOT HEMORR HEMANG INC GUIDE ROADMAPPING  11/28/2016  . IR EMBO TUMOR ORGAN ISCHEMIA INFARCT INC GUIDE ROADMAPPING  12/13/2016  . IR EMBO TUMOR ORGAN ISCHEMIA INFARCT INC GUIDE ROADMAPPING  01/09/2017  . IR RADIOLOGIST EVAL & MGMT  11/06/2016  . IR RADIOLOGIST EVAL & MGMT  01/02/2017  . IR US GUIDE VASC ACCESS RIGHT  11/28/2016  . IR US GUIDE VASC ACCESS RIGHT  12/13/2016  . IR US GUIDE VASC ACCESS RIGHT  01/09/2017  . KNEE ARTHROSCOPY Right 11/14/2006  .  KNEE ARTHROSCOPY Bilateral 5 and 6 years ago  . KNEE ARTHROSCOPY WITH LATERAL MENISECTOMY  07/03/2012   Procedure: KNEE ARTHROSCOPY WITH LATERAL MENISECTOMY;  Surgeon: Magnus Sinning, MD;  Location: [facility];  Service: Orthopedics;  Laterality: Left;  with Partial Lateral Menisectomy and Medial Menisectomy. Shaving of medial and lateral femoral condyles. Shaving of patella. Removal of a loose body  . tibial and fibular internal fixation Left   . TOTAL ABDOMINAL HYSTERECTOMY  83 years old  . UPPER GASTROINTESTINAL ENDOSCOPY  2009, 2013     reports that she quit smoking about 41 years ago. Her smoking use included Cigarettes. She has a 2.50 pack-year smoking history. She has never used smokeless tobacco. She reports that she does not drink alcohol or use drugs.  Allergies  Allergen Reactions  . Lisinopril Cough    Family History  Problem Relation Age of Onset  . Heart attack Father 28       fatal  . Coronary artery disease Brother   .  Diabetes Brother   . Prostate cancer Brother   . Prostate cancer Son     Prior to Admission medications   Medication Sig Start Date End Date Taking? Authorizing Provider  acetaminophen (TYLENOL) 500 MG tablet Take 1,000 mg by mouth every 6 (six) hours as needed.    Yes [provider]  amitriptyline (ELAVIL) 25 MG tablet TAKE 1 TABLET AT BEDTIME 03/28/17  Yes Eustaquio Maize, MD  capecitabine (XELODA) 500 MG tablet Take 3 tablets (1,500 mg total) by mouth 2 (two) times daily after a meal. 03/19/17  Yes Curt Bears, MD  escitalopram (LEXAPRO) 20 MG tablet Take 1 tablet (20 mg total) by mouth at bedtime. 10/01/16  Yes Eustaquio Maize, MD  gabapentin (NEURONTIN) 300 MG capsule TAKE 1 TO 2 CAPSULES FOUR TIMES A DAY 02/15/17  Yes Eustaquio Maize, MD  hydroxypropyl methylcellulose / hypromellose (ISOPTO TEARS / GONIOVISC) 2.5 % ophthalmic solution Place 1 drop into both eyes at bedtime.   Yes [provider]  levothyroxine (SYNTHROID,  LEVOTHROID) 75 MCG tablet TAKE 1 TABLET DAILY BEFORE BREAKFAST 12/25/16  Yes Eustaquio Maize, MD  losartan (COZAAR) 50 MG tablet TAKE 1 TABLET DAILY 01/14/17  Yes Eustaquio Maize, MD  lovastatin (MEVACOR) 40 MG tablet TAKE 1 TABLET AT BEDTIME 04/23/17  Yes Eustaquio Maize, MD  Melatonin 3 MG TBDP Take 3-6 mg by mouth at bedtime as needed. Patient taking differently: Take 6 mg by mouth at bedtime as needed.  08/02/16  Yes Eustaquio Maize, MD  nitrofurantoin (MACRODANTIN) 50 MG capsule Take 50 mg by mouth at bedtime.  03/18/17  Yes [provider]  omeprazole (PRILOSEC) 40 MG capsule TAKE 1 CAPSULE DAILY 04/23/17  Yes Eustaquio Maize, MD  ondansetron (ZOFRAN) 8 MG tablet Take 1 tablet (8 mg total) by mouth every 8 (eight) hours as needed for nausea or vomiting. 03/19/17  Yes Curt Bears, MD  temozolomide (TEMODAR) 140 MG capsule Take 2 capsules (280 mg total) by mouth daily. May take on an empty stomach or at bedtime to decrease nausea & vomiting. 03/19/17  Yes Curt Bears, MD  Vitamin D, Ergocalciferol, (DRISDOL) 50000 units CAPS capsule TAKE 1 CAPSULE EVERY 7 DAYS 02/08/17  Yes Eustaquio Maize, MD  fluticasone (FLONASE) 50 MCG/ACT nasal spray Place 2 sprays into both nostrils at bedtime. Patient not taking: Reported on 04/23/2017 09/20/16   Eustaquio Maize, MD  oxyCODONE-acetaminophen (PERCOCET) 5-325 MG tablet Take 1-2 tablets by mouth every 4 (four) hours as needed. Patient not taking: Reported on 02/18/2017 01/13/17   Drenda Freeze, MD    Physical Exam:  Constitutional:Obese female in NAD, calm, comfortable Vitals:   04/23/17 1954  BP: (!) 133/58  Pulse: 74  Resp: 20  Temp: 98 F (36.7 C)  TempSrc: Oral  SpO2: 96%  Weight: 99.8 kg (220 lb)  Height: 5\' 4"  (1.626 m)   Eyes: PERRL, lids and conjunctivae normal ENMT: Mucous membranes are moist. Posterior pharynx clear of any exudate or lesions. Neck: normal, supple, no masses, no thyromegaly Respiratory: clear to  auscultation bilaterally, no wheezing, no crackles. Normal respiratory effort. No accessory muscle use.  Cardiovascular: Regular rate and rhythm, no murmurs / rubs / gallops. No extremity edema. 2+ pedal pulses. No carotid bruits.  Abdomen: no tenderness, no masses palpated. No hepatosplenomegaly. Bowel sounds positive.  Musculoskeletal: no clubbing / cyanosis. No joint deformity upper and lower extremities. Good ROM, no contractures. Normal muscle tone.  Skin: no rashes, lesions,  ulcers. No induration Neurologic: CN 2-12 grossly intact. Sensation intact, DTR normal. Patient wheelchair bound Psychiatric: Normal judgment and insight. Alert and oriented x 3. anious mood.     Labs on Admission: I have personally reviewed following labs and imaging studies  CBC:  Recent Labs Lab 04/23/17 1241  WBC 5.4  NEUTROABS 3.2  HGB 12.9  HCT 38.1  MCV 105.8*  PLT 338*   Basic Metabolic Panel:  Recent Labs Lab 04/23/17 1241  NA 136  K 5.2*  CO2 27  GLUCOSE 100  BUN 13.8  CREATININE 1.0  CALCIUM 10.4   GFR: Estimated Creatinine Clearance: 50.6 mL/min (by C-G formula based on SCr of 1 mg/dL). Liver Function Tests:  Recent Labs Lab 04/23/17 1241  AST 35*  ALT 21  ALKPHOS 117  BILITOT 0.69  PROT 7.1  ALBUMIN 3.5   No results for input(s): LIPASE, AMYLASE in the last 168 hours. No results for input(s): AMMONIA in the last 168 hours. Coagulation Profile: No results for input(s): INR, PROTIME in the last 168 hours. Cardiac Enzymes: No results for input(s): CKTOTAL, CKMB, CKMBINDEX, TROPONINI in the last 168 hours. BNP (last 3 results) No results for input(s): PROBNP in the last 8760 hours. HbA1C: No results for input(s): HGBA1C in the last 72 hours. CBG: No results for input(s): GLUCAP in the last 168 hours. Lipid Profile: No results for input(s): CHOL, HDL, LDLCALC, TRIG, CHOLHDL, LDLDIRECT in the last 72 hours. Thyroid Function Tests: No results for input(s): TSH,  T4TOTAL, FREET4, T3FREE, THYROIDAB in the last 72 hours. Anemia Panel: No results for input(s): VITAMINB12, FOLATE, FERRITIN, TIBC, IRON, RETICCTPCT in the last 72 hours. Urine analysis:    Component Value Date/Time   COLORURINE YELLOW 01/13/2017 1901   APPEARANCEUR Clear 01/15/2017 1517   LABSPEC 1.010 01/13/2017 1901   PHURINE 6.5 01/13/2017 1901   GLUCOSEU Negative 01/15/2017 1517   HGBUR NEGATIVE 01/13/2017 1901   BILIRUBINUR Negative 01/15/2017 1517   KETONESUR NEGATIVE 01/13/2017 1901   PROTEINUR Negative 01/15/2017 1517   PROTEINUR NEGATIVE 01/13/2017 1901   UROBILINOGEN negative 05/24/2015 1434   NITRITE Negative 01/15/2017 1517   NITRITE NEGATIVE 01/13/2017 1901   LEUKOCYTESUR 1+ (A) 01/15/2017 1517   Sepsis Labs: No results found for this or any previous visit (from the past 240 hour(s)).   Radiological Exams on Admission: Ct Chest W Contrast  Result Date: 04/23/2017 CLINICAL DATA:  Followup lung cancer. EXAM: CT CHEST, ABDOMEN, AND PELVIS WITH CONTRAST TECHNIQUE: Multidetector CT imaging of the chest, abdomen and pelvis was performed following the standard protocol during bolus administration of intravenous contrast. CONTRAST:  176mL ISOVUE-300 IOPAMIDOL (ISOVUE-300) INJECTION 61% COMPARISON:  CT abdomen and pelvis from 01/13/2017 and PET-CT from 10/02/2016. FINDINGS: CT CHEST FINDINGS Cardiovascular: The heart size is normal. Aortic atherosclerosis. Calcification within the LAD coronary artery identified. Filling defects within the right lower lobe pulmonary artery is identified compatible with pulmonary embolus, age indeterminate. Mediastinum/Nodes: The trachea appears patent and is midline. Normal appearance of the esophagus. No enlarged mediastinal or hilar lymph nodes. No supraclavicular adenopathy. Lungs/Pleura: No pleural effusion. Pulmonary nodule within the anteromedial right upper lobe measures 1.1 cm, image 41 of series 4. Unchanged from previous exam. There is a tiny  nodule in the right upper lobe measuring 3 mm. New from previous exam. Anteromedial left upper lobe lung nodule is also new measuring 4 mm, image 22 of series 4. Within the left upper lobe there is a nodule measuring 4 mm, image 44 of series 4.  New from previous exam. Musculoskeletal: No aggressive lytic or sclerotic bone lesions identified. CT ABDOMEN PELVIS FINDINGS Hepatobiliary: Lateral segment of left lobe of liver lesion measures 2.1 cm, image 39 of series 5. On the previous examination this measure 2.3 cm. Second lesion within the lateral segment of left lobe of liver measures 1.5 cm, image 46 of series 5. Previously this measured the same. Lesion within the lateral aspect of the right lobe of liver measures 2.1 cm, image 65 of series 5. Previously 2.4 cm. No new liver lesions identified. The gallbladder appears normal. Pancreas: Normal appearance of the pancreas. Spleen: The spleen is unremarkable. Adrenals/Urinary Tract: The adrenal glands are normal. Bilateral renal cortical scarring identified. There is a small stone within the inferior pole of the left kidney, image 79 of series 5. There are a few small low-density foci within the right kidney which are too small to reliably characterize. Stomach/Bowel: The stomach is normal. The small bowel loops have a normal course and caliber. No bowel obstruction. Normal appearance of the colon. Vascular/Lymphatic: Aortic atherosclerosis. No aneurysm. No abdominal or pelvic adenopathy. No inguinal adenopathy. Reproductive: Status post hysterectomy. No adnexal masses. Other: No abdominal wall hernia or abnormality. No abdominopelvic ascites. Musculoskeletal: Degenerative disc disease noted within the lumbar spine. No aggressive lytic or sclerotic bone lesions. IMPRESSION: 1. Stable appearance of multifocal liver lesions. 2. The dominant pulmonary nodule within the right upper lobe is not significantly changed from 10/02/2016. There are a few scattered tiny  subcentimeter pulmonary nodules which were not confidently identified on previous imaging of the chest. 3. Filling defects within the right lower lobe pulmonary arteries identified compatible with age indeterminate pulmonary emboli. Critical Value/emergent results were called by telephone at the time of interpretation on 04/23/2017 at 5:11 pm to Dr. Alvy Bimler , who verbally acknowledged these results. 4. Aortic Atherosclerosis (ICD10-I70.0). LAD coronary artery calcifications noted. 5. Nonobstructing left renal calculus. Electronically Signed   By: Kerby Moors M.D.   On: 04/23/2017 17:11   Ct Abdomen Pelvis W Contrast  Result Date: 04/23/2017 CLINICAL DATA:  Followup lung cancer. EXAM: CT CHEST, ABDOMEN, AND PELVIS WITH CONTRAST TECHNIQUE: Multidetector CT imaging of the chest, abdomen and pelvis was performed following the standard protocol during bolus administration of intravenous contrast. CONTRAST:  111mL ISOVUE-300 IOPAMIDOL (ISOVUE-300) INJECTION 61% COMPARISON:  CT abdomen and pelvis from 01/13/2017 and PET-CT from 10/02/2016. FINDINGS: CT CHEST FINDINGS Cardiovascular: The heart size is normal. Aortic atherosclerosis. Calcification within the LAD coronary artery identified. Filling defects within the right lower lobe pulmonary artery is identified compatible with pulmonary embolus, age indeterminate. Mediastinum/Nodes: The trachea appears patent and is midline. Normal appearance of the esophagus. No enlarged mediastinal or hilar lymph nodes. No supraclavicular adenopathy. Lungs/Pleura: No pleural effusion. Pulmonary nodule within the anteromedial right upper lobe measures 1.1 cm, image 41 of series 4. Unchanged from previous exam. There is a tiny nodule in the right upper lobe measuring 3 mm. New from previous exam. Anteromedial left upper lobe lung nodule is also new measuring 4 mm, image 22 of series 4. Within the left upper lobe there is a nodule measuring 4 mm, image 44 of series 4. New from  previous exam. Musculoskeletal: No aggressive lytic or sclerotic bone lesions identified. CT ABDOMEN PELVIS FINDINGS Hepatobiliary: Lateral segment of left lobe of liver lesion measures 2.1 cm, image 39 of series 5. On the previous examination this measure 2.3 cm. Second lesion within the lateral segment of left lobe of liver measures 1.5 cm, image  46 of series 5. Previously this measured the same. Lesion within the lateral aspect of the right lobe of liver measures 2.1 cm, image 65 of series 5. Previously 2.4 cm. No new liver lesions identified. The gallbladder appears normal. Pancreas: Normal appearance of the pancreas. Spleen: The spleen is unremarkable. Adrenals/Urinary Tract: The adrenal glands are normal. Bilateral renal cortical scarring identified. There is a small stone within the inferior pole of the left kidney, image 79 of series 5. There are a few small low-density foci within the right kidney which are too small to reliably characterize. Stomach/Bowel: The stomach is normal. The small bowel loops have a normal course and caliber. No bowel obstruction. Normal appearance of the colon. Vascular/Lymphatic: Aortic atherosclerosis. No aneurysm. No abdominal or pelvic adenopathy. No inguinal adenopathy. Reproductive: Status post hysterectomy. No adnexal masses. Other: No abdominal wall hernia or abnormality. No abdominopelvic ascites. Musculoskeletal: Degenerative disc disease noted within the lumbar spine. No aggressive lytic or sclerotic bone lesions. IMPRESSION: 1. Stable appearance of multifocal liver lesions. 2. The dominant pulmonary nodule within the right upper lobe is not significantly changed from 10/02/2016. There are a few scattered tiny subcentimeter pulmonary nodules which were not confidently identified on previous imaging of the chest. 3. Filling defects within the right lower lobe pulmonary arteries identified compatible with age indeterminate pulmonary emboli. Critical Value/emergent results  were called by telephone at the time of interpretation on 04/23/2017 at 5:11 pm to Dr. Alvy Bimler , who verbally acknowledged these results. 4. Aortic Atherosclerosis (ICD10-I70.0). LAD coronary artery calcifications noted. 5. Nonobstructing left renal calculus. Electronically Signed   By: Kerby Moors M.D.   On: 04/23/2017 17:11    EKG: Independently reviewed. Normal sinus rhythm  Assessment/Plan Pulmonary embolus with bilateral lower extremity edema: Acute. Patient found to have signs of a right lung pulmonary embolus on routine surveillance CT scanning. Risk factors including malignancy and being wheelchair bound. - Admit to telemetry - Continuous pulse oximetry with nasal cannula oxygen as needed  - Heparin drip per pharmacy  - Check echocardiogram in a.m. - Check vascular duplex doppler ultrasound of bilateral lower extremities  -  IR consult for need of IVC filter placement -  Duonebs prn sob/wheezing  Neuroendocrine tumor with metastases to the liver: Patient on chemotherapy of Temodar(starts back on 10/10) and Xeloda(starts back on 9/30)  - Notify Dr. Earlie Server in the patients admitted to the hospital.  Essential hypertension - Continue losartan  Hypothyroidism - Continue levothyroxine   Seizure disorder  - Continue gabapentin   Depression - Continue amitriptyline   Cerebellar degeneration patient wheelchair bound  GERD - Continue pharmacy substitution of Protonix for omeprazole  DVT prophylaxis: heparin Code Status: full Family Communication: Discussed plan of care with the patient and family present at bedside Disposition Plan: Possible discharge home following placement of IVC filter Consults called: IR Admission status: Observation  Norval Morton MD [facility] Pager [phone]   If 7PM-7AM, please contact night-coverage [email or web address] Password [facility]  04/23/2017, 9:35 PM

## 2017-04-24 ENCOUNTER — Observation Stay (HOSPITAL_BASED_OUTPATIENT_CLINIC_OR_DEPARTMENT_OTHER): Payer: Medicare Other

## 2017-04-24 ENCOUNTER — Other Ambulatory Visit: Payer: Self-pay

## 2017-04-24 ENCOUNTER — Observation Stay (HOSPITAL_COMMUNITY): Payer: Medicare Other

## 2017-04-24 ENCOUNTER — Encounter: Payer: Self-pay | Admitting: Cardiology

## 2017-04-24 DIAGNOSIS — C7A1 Malignant poorly differentiated neuroendocrine tumors: Secondary | ICD-10-CM | POA: Diagnosis not present

## 2017-04-24 DIAGNOSIS — I340 Nonrheumatic mitral (valve) insufficiency: Secondary | ICD-10-CM | POA: Diagnosis not present

## 2017-04-24 DIAGNOSIS — Z8589 Personal history of malignant neoplasm of other organs and systems: Secondary | ICD-10-CM | POA: Diagnosis not present

## 2017-04-24 DIAGNOSIS — Z888 Allergy status to other drugs, medicaments and biological substances status: Secondary | ICD-10-CM | POA: Diagnosis not present

## 2017-04-24 DIAGNOSIS — M7989 Other specified soft tissue disorders: Secondary | ICD-10-CM

## 2017-04-24 DIAGNOSIS — E785 Hyperlipidemia, unspecified: Secondary | ICD-10-CM | POA: Diagnosis present

## 2017-04-24 DIAGNOSIS — Z9221 Personal history of antineoplastic chemotherapy: Secondary | ICD-10-CM | POA: Diagnosis not present

## 2017-04-24 DIAGNOSIS — I480 Paroxysmal atrial fibrillation: Secondary | ICD-10-CM | POA: Diagnosis present

## 2017-04-24 DIAGNOSIS — C7B02 Secondary carcinoid tumors of liver: Secondary | ICD-10-CM | POA: Diagnosis present

## 2017-04-24 DIAGNOSIS — Z8042 Family history of malignant neoplasm of prostate: Secondary | ICD-10-CM | POA: Diagnosis not present

## 2017-04-24 DIAGNOSIS — Z833 Family history of diabetes mellitus: Secondary | ICD-10-CM | POA: Diagnosis not present

## 2017-04-24 DIAGNOSIS — Z90710 Acquired absence of both cervix and uterus: Secondary | ICD-10-CM | POA: Diagnosis not present

## 2017-04-24 DIAGNOSIS — F329 Major depressive disorder, single episode, unspecified: Secondary | ICD-10-CM | POA: Diagnosis present

## 2017-04-24 DIAGNOSIS — Z993 Dependence on wheelchair: Secondary | ICD-10-CM | POA: Diagnosis not present

## 2017-04-24 DIAGNOSIS — Z8249 Family history of ischemic heart disease and other diseases of the circulatory system: Secondary | ICD-10-CM | POA: Diagnosis not present

## 2017-04-24 DIAGNOSIS — E7800 Pure hypercholesterolemia, unspecified: Secondary | ICD-10-CM | POA: Diagnosis present

## 2017-04-24 DIAGNOSIS — R0602 Shortness of breath: Secondary | ICD-10-CM | POA: Diagnosis not present

## 2017-04-24 DIAGNOSIS — Z8673 Personal history of transient ischemic attack (TIA), and cerebral infarction without residual deficits: Secondary | ICD-10-CM | POA: Diagnosis not present

## 2017-04-24 DIAGNOSIS — I2699 Other pulmonary embolism without acute cor pulmonale: Secondary | ICD-10-CM

## 2017-04-24 DIAGNOSIS — M479 Spondylosis, unspecified: Secondary | ICD-10-CM | POA: Diagnosis present

## 2017-04-24 DIAGNOSIS — R001 Bradycardia, unspecified: Secondary | ICD-10-CM | POA: Diagnosis present

## 2017-04-24 DIAGNOSIS — I10 Essential (primary) hypertension: Secondary | ICD-10-CM | POA: Diagnosis present

## 2017-04-24 DIAGNOSIS — G40909 Epilepsy, unspecified, not intractable, without status epilepticus: Secondary | ICD-10-CM | POA: Diagnosis present

## 2017-04-24 DIAGNOSIS — Z23 Encounter for immunization: Secondary | ICD-10-CM | POA: Diagnosis not present

## 2017-04-24 DIAGNOSIS — G319 Degenerative disease of nervous system, unspecified: Secondary | ICD-10-CM | POA: Diagnosis present

## 2017-04-24 DIAGNOSIS — Z87891 Personal history of nicotine dependence: Secondary | ICD-10-CM | POA: Diagnosis not present

## 2017-04-24 DIAGNOSIS — E039 Hypothyroidism, unspecified: Secondary | ICD-10-CM | POA: Diagnosis present

## 2017-04-24 DIAGNOSIS — K219 Gastro-esophageal reflux disease without esophagitis: Secondary | ICD-10-CM | POA: Diagnosis present

## 2017-04-24 HISTORY — PX: IR IVC FILTER PLMT / S&I /IMG GUID/MOD SED: IMG701

## 2017-04-24 LAB — HEPARIN LEVEL (UNFRACTIONATED)
Heparin Unfractionated: 0.15 IU/mL — ABNORMAL LOW (ref 0.30–0.70)
Heparin Unfractionated: 0.69 IU/mL (ref 0.30–0.70)

## 2017-04-24 LAB — ECHOCARDIOGRAM COMPLETE
HEIGHTINCHES: 64 in
WEIGHTICAEL: 3583.8 [oz_av]

## 2017-04-24 LAB — BASIC METABOLIC PANEL
ANION GAP: 8 (ref 5–15)
BUN: 15 mg/dL (ref 6–20)
CALCIUM: 9.9 mg/dL (ref 8.9–10.3)
CO2: 25 mmol/L (ref 22–32)
Chloride: 106 mmol/L (ref 101–111)
Creatinine, Ser: 0.93 mg/dL (ref 0.44–1.00)
GFR calc Af Amer: >60 mL/min (ref >60)
GFR, EST NON AFRICAN AMERICAN: 56 mL/min — AB (ref >60)
GLUCOSE: 104 mg/dL — AB (ref 65–99)
Potassium: 4.1 mmol/L (ref 3.5–5.1)
Sodium: 139 mmol/L (ref 135–145)

## 2017-04-24 LAB — CBC
HCT: 33.1 % — ABNORMAL LOW (ref 36.0–46.0)
HEMOGLOBIN: 11.3 g/dL — AB (ref 12.0–15.0)
MCH: 35.2 pg — ABNORMAL HIGH (ref 26.0–34.0)
MCHC: 34.1 g/dL (ref 30.0–36.0)
MCV: 103.1 fL — ABNORMAL HIGH (ref 78.0–100.0)
PLATELETS: 114 10*3/uL — AB (ref 150–400)
RBC: 3.21 MIL/uL — ABNORMAL LOW (ref 3.87–5.11)
RDW: 16.6 % — AB (ref 11.5–15.5)
WBC: 6.2 10*3/uL (ref 4.0–10.5)

## 2017-04-24 MED ORDER — MIDAZOLAM HCL 2 MG/2ML IJ SOLN
INTRAMUSCULAR | Status: AC | PRN
  Administered 2017-04-24 (×2): 1 mg via INTRAVENOUS

## 2017-04-24 MED ORDER — HEPARIN (PORCINE) IN NACL 100-0.45 UNIT/ML-% IJ SOLN
1550.0000 [IU]/h | INTRAMUSCULAR | Status: DC
  Administered 2017-04-25: 1450 [IU]/h via INTRAVENOUS

## 2017-04-24 MED ORDER — FENTANYL CITRATE (PF) 100 MCG/2ML IJ SOLN
INTRAMUSCULAR | Status: AC | PRN
  Administered 2017-04-24 (×2): 50 ug via INTRAVENOUS

## 2017-04-24 MED ORDER — FENTANYL CITRATE (PF) 100 MCG/2ML IJ SOLN
INTRAMUSCULAR | Status: AC
  Filled 2017-04-24: qty 2

## 2017-04-24 MED ORDER — IOPAMIDOL (ISOVUE-300) INJECTION 61%
100.0000 mL | Freq: Once | INTRAVENOUS | Status: AC | PRN
  Administered 2017-04-24: 45 mL via INTRAVENOUS

## 2017-04-24 MED ORDER — IOPAMIDOL (ISOVUE-300) INJECTION 61%
INTRAVENOUS | Status: AC
  Filled 2017-04-24: qty 100

## 2017-04-24 MED ORDER — IOPAMIDOL (ISOVUE-300) INJECTION 61%
50.0000 mL | Freq: Once | INTRAVENOUS | Status: AC | PRN
  Administered 2017-04-24: 6 mL via INTRAVENOUS

## 2017-04-24 MED ORDER — GABAPENTIN 300 MG PO CAPS
600.0000 mg | ORAL_CAPSULE | Freq: Two times a day (BID) | ORAL | Status: DC
  Administered 2017-04-24 – 2017-04-25 (×3): 600 mg via ORAL
  Filled 2017-04-24 (×3): qty 2

## 2017-04-24 MED ORDER — HEPARIN (PORCINE) IN NACL 100-0.45 UNIT/ML-% IJ SOLN
1500.0000 [IU]/h | INTRAMUSCULAR | Status: DC
  Administered 2017-04-24: 1200 [IU]/h via INTRAVENOUS
  Filled 2017-04-24: qty 250

## 2017-04-24 MED ORDER — IOPAMIDOL (ISOVUE-300) INJECTION 61%
INTRAVENOUS | Status: AC
  Administered 2017-04-24: 6 mL via INTRAVENOUS
  Filled 2017-04-24: qty 50

## 2017-04-24 MED ORDER — MIDAZOLAM HCL 2 MG/2ML IJ SOLN
INTRAMUSCULAR | Status: AC
  Filled 2017-04-24: qty 2

## 2017-04-24 MED ORDER — GABAPENTIN 300 MG PO CAPS: 300.0000 mg | ORAL_CAPSULE | ORAL | Status: DC

## 2017-04-24 MED ORDER — INFLUENZA VAC SPLIT HIGH-DOSE 0.5 ML IM SUSY
0.5000 mL | PREFILLED_SYRINGE | INTRAMUSCULAR | Status: AC
  Administered 2017-04-25: 0.5 mL via INTRAMUSCULAR
  Filled 2017-04-24: qty 0.5

## 2017-04-24 MED ORDER — LIDOCAINE HCL 1 % IJ SOLN
INTRAMUSCULAR | Status: AC
  Filled 2017-04-24: qty 20

## 2017-04-24 NOTE — ED Notes (Signed)
Attempted to call report Melissa RN room 1408 unable to take at this time.

## 2017-04-24 NOTE — Progress Notes (Signed)
ANTICOAGULATION CONSULT NOTE -   Pharmacy Consult for Heparin Indication: pulmonary embolus  Allergies  Allergen Reactions  . Lisinopril Cough    Patient Measurements: Height: 5\' 4"  (162.6 cm) Weight: 223 lb 15.8 oz (101.6 kg) IBW/kg (Calculated) : 54.7 Heparin Dosing Weight: 77 kg  Vital Signs: Temp: 97.9 F (36.6 C) (09/26 0551) Temp Source: Oral (09/26 0551) BP: 119/55 (09/26 0551) Pulse Rate: 75 (09/26 0551)  Labs:  Recent Labs  04/23/17 1241 04/23/17 2128 04/24/17 0557 04/24/17 1050  HGB 12.9 12.2 11.3*  --   HCT 38.1 36.5 33.1*  --   PLT 130* 138* 114*  --   APTT  --  27  --   --   LABPROT  --  13.8  --   --   INR  --  1.07  --   --   HEPARINUNFRC  --   --   --  0.15*  CREATININE 1.0 0.97 0.93  --     Estimated Creatinine Clearance: 55 mL/min (by C-G formula based on SCr of 0.93 mg/dL).   Medical History: Past Medical History:  Diagnosis Date  . Acute respiratory failure with hypoxia ([place]) 10/23/2015  . Allergic rhinitis    PT. DENIES  . Anxiety   . Arthritis    NECK  . Ataxia   . Bradycardia    primarily nocturnal  . Burning tongue syndrome 25 years  . Cancer ([place]) dx'd 06/2016   liver  . Cataract   . Cerebellar degeneration   . Chronic pericarditis   . Chronic urinary tract infection   . Complication of anesthesia    low o2 sats, coded 30 years ago  . CVA (cerebral infarction) 05/2003  . Depression   . Encounter for antineoplastic chemotherapy 10/09/2016  . Gait disorder   . Gastric polyps   . GERD (gastroesophageal reflux disease)   . Goals of care, counseling/discussion 10/09/2016  . High cholesterol   . Hyperlipidemia   . Hypertension   . Hypertension   . Hypotension   . Hypothyroidism   . IBS (irritable bowel syndrome)   . Obesity   . Paroxysmal atrial fibrillation ([name])    chads2vasc score is 6,  she is felt to be a poor candidate for anticoagulation  . Personal history of arterial venous malformation (AVM)    right side of  face  . Seizure disorder ([place])   . Seizures ([place]) 2003   " smelling"- Gabapentin "no problem"  . Shortness of breath dyspnea    with exertion  . Sternum fx 10/27/2013  . Stroke [facility]) 5 years ago   Right side of face weak, slurred speach-   . Thyroid disease   . TIA (transient ischemic attack)   . UTI (lower urinary tract infection) 03/27/2016   "frequently"    Assessment:  83 yr female with metastatic cancer (taking oral chemotherapy) sent to ED with pulmonary embolism.   Pharmacy consulted to dose IV heparin  Noted episode of epistaxis a week ago and history of bleeding episodes; spoke with MD who requested no heparin bolus  Baseline aPTT and INR: WNL  04/24/2017 HL subtherapeutic at 0.15 H/H low Plts down  Goal of Therapy:  Heparin level 0.3-0.7 units/ml Monitor platelets by anticoagulation protocol: Yes   Plan:   Increase Heparin infusion to 1500 units/hr (No bolus due to episode of epistaxis a week ago and history of bleeding episodes)  Check heparin level in 8 hr    Check CBC and heparin level daily  while on heparin   Dolly Rias RPh 04/24/2017, 11:54 AM Pager [phone]

## 2017-04-24 NOTE — Progress Notes (Signed)
Referring Physician(s): Smith,Rondell  Supervising Physician: Jacqulynn Cadet  Patient Status:  [facility] - In-pt  Chief Complaint: Metastatic neuroendocrine tumor, left leg swelling/DVT, pulmonary emboli   Subjective: Patient familiar to IR service from prior cerebral arteriogram 2004 as well as hepatic Y 90 radio embolization procedures in May and June of this year secondary to known history of metastatic neuroendocrine tumor. Additional medical history as listed below. Recent surveillance CT scan of her chest/abdomen/ pelvis yesterday revealed stable liver lesions, dominant pulmonary nodule in the right upper lobe without significant change, few scattered tiny sub-centimeters pulmonary nodules, filling defects within the right lower lobe pulmonary arteries compatible with age-indeterminate pulmonary emboli. Patient relates that for the last several weeks she has had some left leg swelling, intermittent chest/ back discomfort as well as dyspnea. Lower extremity venous Doppler study today revealed left popliteal, posterior tibial and peroneal vein DVT. She has reported history of right facial AVM diagnosed in the 1980s and was advised not to ever take blood thinning medication. Request now received from internal medicine for IVC filter placement. She currently denies fever, headache, chest pain, abdominal/back pain, nausea, vomiting or bleeding.  Past Medical History:  Diagnosis Date  . Acute respiratory failure with hypoxia ([place]) 10/23/2015  . Allergic rhinitis    PT. DENIES  . Anxiety   . Arthritis    NECK  . Ataxia   . Bradycardia    primarily nocturnal  . Burning tongue syndrome 25 years  . Cancer ([place]) dx'd 06/2016   liver  . Cataract   . Cerebellar degeneration   . Chronic pericarditis   . Chronic urinary tract infection   . Complication of anesthesia    low o2 sats, coded 30 years ago  . CVA (cerebral infarction) 05/2003  . Depression   . Encounter for antineoplastic  chemotherapy 10/09/2016  . Gait disorder   . Gastric polyps   . GERD (gastroesophageal reflux disease)   . Goals of care, counseling/discussion 10/09/2016  . High cholesterol   . Hyperlipidemia   . Hypertension   . Hypertension   . Hypotension   . Hypothyroidism   . IBS (irritable bowel syndrome)   . Obesity   . Paroxysmal atrial fibrillation ([name])    chads2vasc score is 6,  she is felt to be a poor candidate for anticoagulation  . Personal history of arterial venous malformation (AVM)    right side of face  . Seizure disorder ([place])   . Seizures ([place]) 2003   " smelling"- Gabapentin "no problem"  . Shortness of breath dyspnea    with exertion  . Sternum fx 10/27/2013  . Stroke [place]) 5 years ago   Right side of face weak, slurred speach-   . Thyroid disease   . TIA (transient ischemic attack)   . UTI (lower urinary tract infection) 03/27/2016   "frequently"   Past Surgical History:  Procedure Laterality Date  . APPENDECTOMY  83 years old  . COLONOSCOPY  2006, 2009  . COLONOSCOPY WITH PROPOFOL N/A 03/28/2016   Procedure: COLONOSCOPY WITH PROPOFOL;  Surgeon: Gatha Mayer, MD;  Location: [place];  Service: Endoscopy;  Laterality: N/A;  . cyst removed  35 years ago  . EP IMPLANTABLE DEVICE N/A 01/06/2015   Procedure: Loop Recorder Insertion;  Surgeon: Thompson Grayer, MD;  Location: [place] [facility];  Service: Cardiovascular;  Laterality: N/A;  . EYE SURGERY Right    Cataract  . IR ANGIOGRAM SELECTIVE EACH ADDITIONAL VESSEL  11/28/2016  .  IR ANGIOGRAM SELECTIVE EACH ADDITIONAL VESSEL  11/28/2016  . IR ANGIOGRAM SELECTIVE EACH ADDITIONAL VESSEL  11/28/2016  . IR ANGIOGRAM SELECTIVE EACH ADDITIONAL VESSEL  11/28/2016  . IR ANGIOGRAM SELECTIVE EACH ADDITIONAL VESSEL  12/13/2016  . IR ANGIOGRAM SELECTIVE EACH ADDITIONAL VESSEL  12/13/2016  . IR ANGIOGRAM SELECTIVE EACH ADDITIONAL VESSEL  01/09/2017  . IR ANGIOGRAM VISCERAL SELECTIVE  11/28/2016  . IR ANGIOGRAM VISCERAL SELECTIVE  11/28/2016    . IR ANGIOGRAM VISCERAL SELECTIVE  12/13/2016  . IR ANGIOGRAM VISCERAL SELECTIVE  12/13/2016  . IR ANGIOGRAM VISCERAL SELECTIVE  01/09/2017  . IR EMBO ARTERIAL NOT HEMORR HEMANG INC GUIDE ROADMAPPING  11/28/2016  . IR EMBO TUMOR ORGAN ISCHEMIA INFARCT INC GUIDE ROADMAPPING  12/13/2016  . IR EMBO TUMOR ORGAN ISCHEMIA INFARCT INC GUIDE ROADMAPPING  01/09/2017  . IR RADIOLOGIST EVAL & MGMT  11/06/2016  . IR RADIOLOGIST EVAL & MGMT  01/02/2017  . IR US GUIDE VASC ACCESS RIGHT  11/28/2016  . IR US GUIDE VASC ACCESS RIGHT  12/13/2016  . IR US GUIDE VASC ACCESS RIGHT  01/09/2017  . KNEE ARTHROSCOPY Right 11/14/2006  . KNEE ARTHROSCOPY Bilateral 5 and 6 years ago  . KNEE ARTHROSCOPY WITH LATERAL MENISECTOMY  07/03/2012   Procedure: KNEE ARTHROSCOPY WITH LATERAL MENISECTOMY;  Surgeon: Magnus Sinning, MD;  Location: [facility];  Service: Orthopedics;  Laterality: Left;  with Partial Lateral Menisectomy and Medial Menisectomy. Shaving of medial and lateral femoral condyles. Shaving of patella. Removal of a loose body  . tibial and fibular internal fixation Left   . TOTAL ABDOMINAL HYSTERECTOMY  83 years old  . UPPER GASTROINTESTINAL ENDOSCOPY  2009, 2013     Allergies: Lisinopril  Medications: Prior to Admission medications   Medication Sig Start Date End Date Taking? Authorizing Provider  acetaminophen (TYLENOL) 500 MG tablet Take 1,000 mg by mouth every 6 (six) hours as needed.    Yes [provider]  amitriptyline (ELAVIL) 25 MG tablet TAKE 1 TABLET AT BEDTIME 03/28/17  Yes Eustaquio Maize, MD  capecitabine (XELODA) 500 MG tablet Take 3 tablets (1,500 mg total) by mouth 2 (two) times daily after a meal. 03/19/17  Yes Curt Bears, MD  escitalopram (LEXAPRO) 20 MG tablet Take 1 tablet (20 mg total) by mouth at bedtime. 10/01/16  Yes Eustaquio Maize, MD  gabapentin (NEURONTIN) 300 MG capsule TAKE 1 TO 2 CAPSULES FOUR TIMES A DAY 02/15/17  Yes Eustaquio Maize, MD  hydroxypropyl methylcellulose /  hypromellose (ISOPTO TEARS / GONIOVISC) 2.5 % ophthalmic solution Place 1 drop into both eyes at bedtime.   Yes [provider]  levothyroxine (SYNTHROID, LEVOTHROID) 75 MCG tablet TAKE 1 TABLET DAILY BEFORE BREAKFAST 12/25/16  Yes Eustaquio Maize, MD  losartan (COZAAR) 50 MG tablet TAKE 1 TABLET DAILY 01/14/17  Yes Eustaquio Maize, MD  lovastatin (MEVACOR) 40 MG tablet TAKE 1 TABLET AT BEDTIME 04/23/17  Yes Eustaquio Maize, MD  Melatonin 3 MG TBDP Take 3-6 mg by mouth at bedtime as needed. Patient taking differently: Take 6 mg by mouth at bedtime as needed.  08/02/16  Yes Eustaquio Maize, MD  nitrofurantoin (MACRODANTIN) 50 MG capsule Take 50 mg by mouth at bedtime.  03/18/17  Yes [provider]  omeprazole (PRILOSEC) 40 MG capsule TAKE 1 CAPSULE DAILY 04/23/17  Yes Eustaquio Maize, MD  ondansetron (ZOFRAN) 8 MG tablet Take 1 tablet (8 mg total) by mouth every 8 (eight) hours as needed for nausea or vomiting.  03/19/17  Yes Curt Bears, MD  temozolomide (TEMODAR) 140 MG capsule Take 2 capsules (280 mg total) by mouth daily. May take on an empty stomach or at bedtime to decrease nausea & vomiting. 03/19/17  Yes Curt Bears, MD  Vitamin D, Ergocalciferol, (DRISDOL) 50000 units CAPS capsule TAKE 1 CAPSULE EVERY 7 DAYS 02/08/17  Yes Eustaquio Maize, MD  fluticasone (FLONASE) 50 MCG/ACT nasal spray Place 2 sprays into both nostrils at bedtime. Patient not taking: Reported on 04/23/2017 09/20/16   Eustaquio Maize, MD  oxyCODONE-acetaminophen (PERCOCET) 5-325 MG tablet Take 1-2 tablets by mouth every 4 (four) hours as needed. Patient not taking: Reported on 02/18/2017 01/13/17   Drenda Freeze, MD     Vital Signs: BP (!) 119/55 (BP Location: Left Arm)   Pulse 75   Temp 97.9 F (36.6 C) (Oral)   Resp 16   Ht 5\' 4"  (1.626 m)   Wt 223 lb 15.8 oz (101.6 kg)   SpO2 94%   BMI 38.45 kg/m   Physical Exam awake, alert. Chest clear to auscultation bilaterally anteriorly. Heart  with regular rate and rhythm. Abdomen obese, soft, positive bowel sounds, nontender. Bilateral lower extremity edema, more so on left.  Imaging: Ct Chest W Contrast  Result Date: 04/23/2017 CLINICAL DATA:  Followup lung cancer. EXAM: CT CHEST, ABDOMEN, AND PELVIS WITH CONTRAST TECHNIQUE: Multidetector CT imaging of the chest, abdomen and pelvis was performed following the standard protocol during bolus administration of intravenous contrast. CONTRAST:  167mL ISOVUE-300 IOPAMIDOL (ISOVUE-300) INJECTION 61% COMPARISON:  CT abdomen and pelvis from 01/13/2017 and PET-CT from 10/02/2016. FINDINGS: CT CHEST FINDINGS Cardiovascular: The heart size is normal. Aortic atherosclerosis. Calcification within the LAD coronary artery identified. Filling defects within the right lower lobe pulmonary artery is identified compatible with pulmonary embolus, age indeterminate. Mediastinum/Nodes: The trachea appears patent and is midline. Normal appearance of the esophagus. No enlarged mediastinal or hilar lymph nodes. No supraclavicular adenopathy. Lungs/Pleura: No pleural effusion. Pulmonary nodule within the anteromedial right upper lobe measures 1.1 cm, image 41 of series 4. Unchanged from previous exam. There is a tiny nodule in the right upper lobe measuring 3 mm. New from previous exam. Anteromedial left upper lobe lung nodule is also new measuring 4 mm, image 22 of series 4. Within the left upper lobe there is a nodule measuring 4 mm, image 44 of series 4. New from previous exam. Musculoskeletal: No aggressive lytic or sclerotic bone lesions identified. CT ABDOMEN PELVIS FINDINGS Hepatobiliary: Lateral segment of left lobe of liver lesion measures 2.1 cm, image 39 of series 5. On the previous examination this measure 2.3 cm. Second lesion within the lateral segment of left lobe of liver measures 1.5 cm, image 46 of series 5. Previously this measured the same. Lesion within the lateral aspect of the right lobe of liver  measures 2.1 cm, image 65 of series 5. Previously 2.4 cm. No new liver lesions identified. The gallbladder appears normal. Pancreas: Normal appearance of the pancreas. Spleen: The spleen is unremarkable. Adrenals/Urinary Tract: The adrenal glands are normal. Bilateral renal cortical scarring identified. There is a small stone within the inferior pole of the left kidney, image 79 of series 5. There are a few small low-density foci within the right kidney which are too small to reliably characterize. Stomach/Bowel: The stomach is normal. The small bowel loops have a normal course and caliber. No bowel obstruction. Normal appearance of the colon. Vascular/Lymphatic: Aortic atherosclerosis. No aneurysm. No abdominal or pelvic adenopathy. No  inguinal adenopathy. Reproductive: Status post hysterectomy. No adnexal masses. Other: No abdominal wall hernia or abnormality. No abdominopelvic ascites. Musculoskeletal: Degenerative disc disease noted within the lumbar spine. No aggressive lytic or sclerotic bone lesions. IMPRESSION: 1. Stable appearance of multifocal liver lesions. 2. The dominant pulmonary nodule within the right upper lobe is not significantly changed from 10/02/2016. There are a few scattered tiny subcentimeter pulmonary nodules which were not confidently identified on previous imaging of the chest. 3. Filling defects within the right lower lobe pulmonary arteries identified compatible with age indeterminate pulmonary emboli. Critical Value/emergent results were called by telephone at the time of interpretation on 04/23/2017 at 5:11 pm to Dr. Alvy Bimler , who verbally acknowledged these results. 4. Aortic Atherosclerosis (ICD10-I70.0). LAD coronary artery calcifications noted. 5. Nonobstructing left renal calculus. Electronically Signed   By: Kerby Moors M.D.   On: 04/23/2017 17:11   Ct Abdomen Pelvis W Contrast  Result Date: 04/23/2017 CLINICAL DATA:  Followup lung cancer. EXAM: CT CHEST, ABDOMEN, AND  PELVIS WITH CONTRAST TECHNIQUE: Multidetector CT imaging of the chest, abdomen and pelvis was performed following the standard protocol during bolus administration of intravenous contrast. CONTRAST:  163mL ISOVUE-300 IOPAMIDOL (ISOVUE-300) INJECTION 61% COMPARISON:  CT abdomen and pelvis from 01/13/2017 and PET-CT from 10/02/2016. FINDINGS: CT CHEST FINDINGS Cardiovascular: The heart size is normal. Aortic atherosclerosis. Calcification within the LAD coronary artery identified. Filling defects within the right lower lobe pulmonary artery is identified compatible with pulmonary embolus, age indeterminate. Mediastinum/Nodes: The trachea appears patent and is midline. Normal appearance of the esophagus. No enlarged mediastinal or hilar lymph nodes. No supraclavicular adenopathy. Lungs/Pleura: No pleural effusion. Pulmonary nodule within the anteromedial right upper lobe measures 1.1 cm, image 41 of series 4. Unchanged from previous exam. There is a tiny nodule in the right upper lobe measuring 3 mm. New from previous exam. Anteromedial left upper lobe lung nodule is also new measuring 4 mm, image 22 of series 4. Within the left upper lobe there is a nodule measuring 4 mm, image 44 of series 4. New from previous exam. Musculoskeletal: No aggressive lytic or sclerotic bone lesions identified. CT ABDOMEN PELVIS FINDINGS Hepatobiliary: Lateral segment of left lobe of liver lesion measures 2.1 cm, image 39 of series 5. On the previous examination this measure 2.3 cm. Second lesion within the lateral segment of left lobe of liver measures 1.5 cm, image 46 of series 5. Previously this measured the same. Lesion within the lateral aspect of the right lobe of liver measures 2.1 cm, image 65 of series 5. Previously 2.4 cm. No new liver lesions identified. The gallbladder appears normal. Pancreas: Normal appearance of the pancreas. Spleen: The spleen is unremarkable. Adrenals/Urinary Tract: The adrenal glands are normal.  Bilateral renal cortical scarring identified. There is a small stone within the inferior pole of the left kidney, image 79 of series 5. There are a few small low-density foci within the right kidney which are too small to reliably characterize. Stomach/Bowel: The stomach is normal. The small bowel loops have a normal course and caliber. No bowel obstruction. Normal appearance of the colon. Vascular/Lymphatic: Aortic atherosclerosis. No aneurysm. No abdominal or pelvic adenopathy. No inguinal adenopathy. Reproductive: Status post hysterectomy. No adnexal masses. Other: No abdominal wall hernia or abnormality. No abdominopelvic ascites. Musculoskeletal: Degenerative disc disease noted within the lumbar spine. No aggressive lytic or sclerotic bone lesions. IMPRESSION: 1. Stable appearance of multifocal liver lesions. 2. The dominant pulmonary nodule within the right upper lobe is not significantly changed  from 10/02/2016. There are a few scattered tiny subcentimeter pulmonary nodules which were not confidently identified on previous imaging of the chest. 3. Filling defects within the right lower lobe pulmonary arteries identified compatible with age indeterminate pulmonary emboli. Critical Value/emergent results were called by telephone at the time of interpretation on 04/23/2017 at 5:11 pm to Dr. Alvy Bimler , who verbally acknowledged these results. 4. Aortic Atherosclerosis (ICD10-I70.0). LAD coronary artery calcifications noted. 5. Nonobstructing left renal calculus. Electronically Signed   By: Kerby Moors M.D.   On: 04/23/2017 17:11    Labs:  CBC:  Recent Labs  03/19/17 1102 04/23/17 1241 04/23/17 2128 04/24/17 0557  WBC 4.7 5.4 6.3 6.2  HGB 12.8 12.9 12.2 11.3*  HCT 38.1 38.1 36.5 33.1*  PLT 119* 130* 138* 114*    COAGS:  Recent Labs  08/20/16 1244 11/28/16 0743 01/09/17 0744 04/23/17 2128  INR 1.07 1.04 0.98 1.07  APTT 27  --   --  27    BMP:  Recent Labs  01/13/17 1948  01/25/17 1523  03/19/17 1102 04/23/17 1241 04/23/17 2128 04/24/17 0557  NA 132* 135  < > 135* 136 139 139  K 3.7 5.0  < > 4.2 5.2* 4.8 4.1  CL 95* 97  --   --   --  105 106  CO2 27 23  < > 29 27 27 25   GLUCOSE 146* 93  < > 90 100 118* 104*  BUN 12 13  < > 10.6 13.8 13 15   CALCIUM 10.0 10.3  < > 10.1 10.4 10.0 9.9  CREATININE 0.85 1.15*  < > 0.8 1.0 0.97 0.93  GFRNONAA >60 45*  --   --   --  53* 56*  GFRAA >60 52*  --   --   --  >60 >60  < > = values in this interval not displayed.  LIVER FUNCTION TESTS:  Recent Labs  01/25/17 1523 02/18/17 1153 03/19/17 1102 04/23/17 1241  BILITOT 0.5 0.69 0.57 0.69  AST 31 47* 59* 35*  ALT 22 42 54 21  ALKPHOS 109 99 118 117  PROT 7.3 6.4 6.6 7.1  ALBUMIN 4.3 3.3* 3.3* 3.5    Assessment and Plan: Patient with history of metastatic neuroendocrine tumor to the liver with prior Y 90 hepatic radio embolization in May and June of this year. Also with remote reported history of right facial AVM with recommendations to avoid long term anticoagulation; presenting now with age indeterminate pulmonary emboli and left lower extremity DVT. Request received for IVC filter placement. Imaging studies and case have been reviewed by Dr. Pascal Lux.Risks and benefits discussed with the patient/spouse including, but not limited to bleeding, infection, contrast induced renal failure, filter fracture or migration which can lead to emergency surgery or even death, strut penetration with damage or irritation to adjacent structures and caval thrombosis.All of the patient's questions were answered, patient is agreeable to proceed.Consent signed and in chart. Procedure scheduled for later this afternoon. Patient currently on IV heparin therapy. Current labs include WBC 6.2, hemoglobin 11.3, platelets 114k, creatinine 0.93, PT 13.8, INR 1.07.     Electronically Signed: D. Rowe Robert, PA-C 04/24/2017, 11:41 AM   I spent a total of 25 minutes at the the patient's  bedside AND on the patient's hospital floor or unit, greater than 50% of which was counseling/coordinating care for IVC filter placement    Patient ID: [name], female   DOB: Sep 06, 1933, 83 y.o.   MRN: [id number]

## 2017-04-24 NOTE — Care Management Obs Status (Signed)
[facility] NOTIFICATION   Patient Details  Name: [name] MRN: [id number] Date of Birth: 12-18-1933   Medicare Observation Status Notification Given:  Yes    MahabirJuliann Pulse, RN 04/24/2017, 1:41 PM

## 2017-04-24 NOTE — Progress Notes (Signed)
Pt had 1.72 and 2.02 pause with bradycardia following.  VSS.  Pt asymptomatic.  MD notified. Andre Lefort

## 2017-04-24 NOTE — Progress Notes (Signed)
  Echocardiogram 2D Echocardiogram has been performed.  Darlina Sicilian M 04/24/2017, 3:11 PM

## 2017-04-24 NOTE — Progress Notes (Signed)
PROGRESS NOTE    EMERALD SHOR  GOT:[id number] DOB: [birth year] DOA: 04/23/2017 PCP: Eustaquio Maize, MD     Brief Narrative:  [name] is a 83 y.o. female with medical history significant of HTN, neuroendocrine tumor of the lung with multiple liver metastases on chemotherapy followed by Dr. Earlie Server, hypothyroidism, seizure disorder, cerebellar degeneration with ataxia, PAF not on anticoagulation; who presents after having routine CT scans of her chest and abdomen for evaluation of her cancer. CT revealed a filling defect of the right lower lung suggestive of pulmonary embolus. Patient was sent here for need of IVC filter. Patient has history of right facial AVM diagnosed in 59s which she was advised not to ever be placed on blood thinners long-term.   Assessment & Plan:   Principal Problem:   Pulmonary embolus [facility]) Active Problems:   Essential hypertension   Cerebellar degeneration   Depression   Hypothyroidism   Neuroendocrine carcinoma metastatic to liver [facility])   Pulmonary embolus with left LE DVT -Continue heparin drip -Echocardiogram pending -IR consulted for IVC filter placement  Neuroendocrine tumor with metastases to the liver -On chemotherapy of Temodar(starts back on 10/10) and Xeloda(starts back on 9/30)  -Follows with Dr. Earlie Server  Intermittent sinus bradycardia with proxysmal A Fib -Reviewed Dr. Jackalyn Lombard note from 06/25/2016. No indication for EP intervention noted at that time as she has had no symptomatic transmissions from loop recorder. Patient is also wheelchair bound at baseline  -Follow up with Dr. Gwenlyn Found and Dr. Rayann Heman as outpatient   Essential hypertension  -Continue losartan   HLD -Continue pravachol  Hypothyroidism  -Continue levothyroxine   Seizure disorder  -Continue gabapentin   Depression  -Continue amitriptyline, lexapro   Cerebellar degeneration -Wheelchair bound at baseline  GERD  -PPI     DVT prophylaxis: heparin  gtt  Code Status: full Family Communication: no family at bedside Disposition Plan: pending IR procedure   Consultants:   IR  Procedures:   None  Antimicrobials:  Anti-infectives    None       Subjective: Patient without any complaints this morning. She is wheelchair bound at baseline. She denies any shortness of breath or chest pain today. She states that she has had some burning chest pain in the past week, as well as shortness of breath, which she attributed to panic attacks. No nausea or vomiting. She is currently nothing by mouth for procedure today  Objective: Vitals:   04/24/17 0111 04/24/17 0140 04/24/17 0551 04/24/17 1330  BP: 108/63 (!) 127/55 (!) 119/55 (!) 115/58  Pulse: 70 72 75 74  Resp: 17 18 16 16   Temp:  98 F (36.7 C) 97.9 F (36.6 C) 97.9 F (36.6 C)  TempSrc:  Oral Oral Oral  SpO2: 96% 98% 94% 97%  Weight:  101.6 kg (223 lb 15.8 oz)    Height:        Intake/Output Summary (Last 24 hours) at 04/24/17 1416 Last data filed at 04/24/17 0600  Gross per 24 hour  Intake           469.55 ml  Output                0 ml  Net           469.55 ml   Filed Weights   04/23/17 1954 04/24/17 0140  Weight: 99.8 kg (220 lb) 101.6 kg (223 lb 15.8 oz)    Examination:  General exam: Appears calm and comfortable  Respiratory system: Clear  to auscultation. Respiratory effort normal. Cardiovascular system: S1 & S2 heard, RRR. No JVD, murmurs, rubs, gallops or clicks. No pedal edema. Gastrointestinal system: Abdomen is nondistended, soft and nontender. No organomegaly or masses felt. Normal bowel sounds heard. Central nervous system: Alert and oriented.  Extremities: Symmetric Skin: No rashes, lesions or ulcers Psychiatry: Judgement and insight appear normal. Mood & affect appropriate.   Data Reviewed: I have personally reviewed following labs and imaging studies  CBC:  Recent Labs Lab 04/23/17 1241 04/23/17 2128 04/24/17 0557  WBC 5.4 6.3 6.2    NEUTROABS 3.2 3.8  --   HGB 12.9 12.2 11.3*  HCT 38.1 36.5 33.1*  MCV 105.8* 102.2* 103.1*  PLT 130* 138* 102*   Basic Metabolic Panel:  Recent Labs Lab 04/23/17 1241 04/23/17 2128 04/24/17 0557  NA 136 139 139  K 5.2* 4.8 4.1  CL  --  105 106  CO2 27 27 25   GLUCOSE 100 118* 104*  BUN 13.8 13 15   CREATININE 1.0 0.97 0.93  CALCIUM 10.4 10.0 9.9   GFR: Estimated Creatinine Clearance: 55 mL/min (by C-G formula based on SCr of 0.93 mg/dL). Liver Function Tests:  Recent Labs Lab 04/23/17 1241  AST 35*  ALT 21  ALKPHOS 117  BILITOT 0.69  PROT 7.1  ALBUMIN 3.5   No results for input(s): LIPASE, AMYLASE in the last 168 hours. No results for input(s): AMMONIA in the last 168 hours. Coagulation Profile:  Recent Labs Lab 04/23/17 2128  INR 1.07   Cardiac Enzymes: No results for input(s): CKTOTAL, CKMB, CKMBINDEX, TROPONINI in the last 168 hours. BNP (last 3 results) No results for input(s): PROBNP in the last 8760 hours. HbA1C: No results for input(s): HGBA1C in the last 72 hours. CBG: No results for input(s): GLUCAP in the last 168 hours. Lipid Profile: No results for input(s): CHOL, HDL, LDLCALC, TRIG, CHOLHDL, LDLDIRECT in the last 72 hours. Thyroid Function Tests: No results for input(s): TSH, T4TOTAL, FREET4, T3FREE, THYROIDAB in the last 72 hours. Anemia Panel: No results for input(s): VITAMINB12, FOLATE, FERRITIN, TIBC, IRON, RETICCTPCT in the last 72 hours. Sepsis Labs: No results for input(s): PROCALCITON, LATICACIDVEN in the last 168 hours.  No results found for this or any previous visit (from the past 240 hour(s)).     Radiology Studies: Ct Chest W Contrast  Result Date: 04/23/2017 CLINICAL DATA:  Followup lung cancer. EXAM: CT CHEST, ABDOMEN, AND PELVIS WITH CONTRAST TECHNIQUE: Multidetector CT imaging of the chest, abdomen and pelvis was performed following the standard protocol during bolus administration of intravenous contrast. CONTRAST:   169mL ISOVUE-300 IOPAMIDOL (ISOVUE-300) INJECTION 61% COMPARISON:  CT abdomen and pelvis from 01/13/2017 and PET-CT from 10/02/2016. FINDINGS: CT CHEST FINDINGS Cardiovascular: The heart size is normal. Aortic atherosclerosis. Calcification within the LAD coronary artery identified. Filling defects within the right lower lobe pulmonary artery is identified compatible with pulmonary embolus, age indeterminate. Mediastinum/Nodes: The trachea appears patent and is midline. Normal appearance of the esophagus. No enlarged mediastinal or hilar lymph nodes. No supraclavicular adenopathy. Lungs/Pleura: No pleural effusion. Pulmonary nodule within the anteromedial right upper lobe measures 1.1 cm, image 41 of series 4. Unchanged from previous exam. There is a tiny nodule in the right upper lobe measuring 3 mm. New from previous exam. Anteromedial left upper lobe lung nodule is also new measuring 4 mm, image 22 of series 4. Within the left upper lobe there is a nodule measuring 4 mm, image 44 of series 4. New from previous exam. Musculoskeletal:  No aggressive lytic or sclerotic bone lesions identified. CT ABDOMEN PELVIS FINDINGS Hepatobiliary: Lateral segment of left lobe of liver lesion measures 2.1 cm, image 39 of series 5. On the previous examination this measure 2.3 cm. Second lesion within the lateral segment of left lobe of liver measures 1.5 cm, image 46 of series 5. Previously this measured the same. Lesion within the lateral aspect of the right lobe of liver measures 2.1 cm, image 65 of series 5. Previously 2.4 cm. No new liver lesions identified. The gallbladder appears normal. Pancreas: Normal appearance of the pancreas. Spleen: The spleen is unremarkable. Adrenals/Urinary Tract: The adrenal glands are normal. Bilateral renal cortical scarring identified. There is a small stone within the inferior pole of the left kidney, image 79 of series 5. There are a few small low-density foci within the right kidney which are  too small to reliably characterize. Stomach/Bowel: The stomach is normal. The small bowel loops have a normal course and caliber. No bowel obstruction. Normal appearance of the colon. Vascular/Lymphatic: Aortic atherosclerosis. No aneurysm. No abdominal or pelvic adenopathy. No inguinal adenopathy. Reproductive: Status post hysterectomy. No adnexal masses. Other: No abdominal wall hernia or abnormality. No abdominopelvic ascites. Musculoskeletal: Degenerative disc disease noted within the lumbar spine. No aggressive lytic or sclerotic bone lesions. IMPRESSION: 1. Stable appearance of multifocal liver lesions. 2. The dominant pulmonary nodule within the right upper lobe is not significantly changed from 10/02/2016. There are a few scattered tiny subcentimeter pulmonary nodules which were not confidently identified on previous imaging of the chest. 3. Filling defects within the right lower lobe pulmonary arteries identified compatible with age indeterminate pulmonary emboli. Critical Value/emergent results were called by telephone at the time of interpretation on 04/23/2017 at 5:11 pm to Dr. Alvy Bimler , who verbally acknowledged these results. 4. Aortic Atherosclerosis (ICD10-I70.0). LAD coronary artery calcifications noted. 5. Nonobstructing left renal calculus. Electronically Signed   By: Kerby Moors M.D.   On: 04/23/2017 17:11   Ct Abdomen Pelvis W Contrast  Result Date: 04/23/2017 CLINICAL DATA:  Followup lung cancer. EXAM: CT CHEST, ABDOMEN, AND PELVIS WITH CONTRAST TECHNIQUE: Multidetector CT imaging of the chest, abdomen and pelvis was performed following the standard protocol during bolus administration of intravenous contrast. CONTRAST:  134mL ISOVUE-300 IOPAMIDOL (ISOVUE-300) INJECTION 61% COMPARISON:  CT abdomen and pelvis from 01/13/2017 and PET-CT from 10/02/2016. FINDINGS: CT CHEST FINDINGS Cardiovascular: The heart size is normal. Aortic atherosclerosis. Calcification within the LAD coronary artery  identified. Filling defects within the right lower lobe pulmonary artery is identified compatible with pulmonary embolus, age indeterminate. Mediastinum/Nodes: The trachea appears patent and is midline. Normal appearance of the esophagus. No enlarged mediastinal or hilar lymph nodes. No supraclavicular adenopathy. Lungs/Pleura: No pleural effusion. Pulmonary nodule within the anteromedial right upper lobe measures 1.1 cm, image 41 of series 4. Unchanged from previous exam. There is a tiny nodule in the right upper lobe measuring 3 mm. New from previous exam. Anteromedial left upper lobe lung nodule is also new measuring 4 mm, image 22 of series 4. Within the left upper lobe there is a nodule measuring 4 mm, image 44 of series 4. New from previous exam. Musculoskeletal: No aggressive lytic or sclerotic bone lesions identified. CT ABDOMEN PELVIS FINDINGS Hepatobiliary: Lateral segment of left lobe of liver lesion measures 2.1 cm, image 39 of series 5. On the previous examination this measure 2.3 cm. Second lesion within the lateral segment of left lobe of liver measures 1.5 cm, image 46 of series 5. Previously  this measured the same. Lesion within the lateral aspect of the right lobe of liver measures 2.1 cm, image 65 of series 5. Previously 2.4 cm. No new liver lesions identified. The gallbladder appears normal. Pancreas: Normal appearance of the pancreas. Spleen: The spleen is unremarkable. Adrenals/Urinary Tract: The adrenal glands are normal. Bilateral renal cortical scarring identified. There is a small stone within the inferior pole of the left kidney, image 79 of series 5. There are a few small low-density foci within the right kidney which are too small to reliably characterize. Stomach/Bowel: The stomach is normal. The small bowel loops have a normal course and caliber. No bowel obstruction. Normal appearance of the colon. Vascular/Lymphatic: Aortic atherosclerosis. No aneurysm. No abdominal or pelvic  adenopathy. No inguinal adenopathy. Reproductive: Status post hysterectomy. No adnexal masses. Other: No abdominal wall hernia or abnormality. No abdominopelvic ascites. Musculoskeletal: Degenerative disc disease noted within the lumbar spine. No aggressive lytic or sclerotic bone lesions. IMPRESSION: 1. Stable appearance of multifocal liver lesions. 2. The dominant pulmonary nodule within the right upper lobe is not significantly changed from 10/02/2016. There are a few scattered tiny subcentimeter pulmonary nodules which were not confidently identified on previous imaging of the chest. 3. Filling defects within the right lower lobe pulmonary arteries identified compatible with age indeterminate pulmonary emboli. Critical Value/emergent results were called by telephone at the time of interpretation on 04/23/2017 at 5:11 pm to Dr. Alvy Bimler , who verbally acknowledged these results. 4. Aortic Atherosclerosis (ICD10-I70.0). LAD coronary artery calcifications noted. 5. Nonobstructing left renal calculus. Electronically Signed   By: Kerby Moors M.D.   On: 04/23/2017 17:11      Scheduled Meds: . amitriptyline  25 mg Oral QHS  . escitalopram  20 mg Oral QHS  . gabapentin  300 mg Oral Q24H  . gabapentin  600 mg Oral BID  . [START ON 04/25/2017] Influenza vac split quadrivalent PF  0.5 mL Intramuscular Tomorrow-1000  . levothyroxine  75 mcg Oral QAC breakfast  . losartan  50 mg Oral Daily  . nitrofurantoin  50 mg Oral QHS  . pantoprazole  40 mg Oral Daily  . polyvinyl alcohol  1 drop Both Eyes QHS  . pravastatin  40 mg Oral q1800   Continuous Infusions: . heparin 1,500 Units/hr (04/24/17 1207)     LOS: 0 days    Time spent: 40 minutes   Dessa Phi, DO [facility] [email or web address] Password [email or web address] 04/24/2017, 2:15 PM

## 2017-04-24 NOTE — Care Management Note (Signed)
Case Management Note  Patient Details  Name: [name] MRN: [id number] Date of Birth: 1934/01/26  Subjective/Objective: 83 y/o f admitted w/PE. From home w/spouse. Has w/c.                   Action/Plan:dc plan home.   Expected Discharge Date:                  Expected Discharge Plan:  Home/Self Care  In-House Referral:     Discharge planning Services  CM Consult  Post Acute Care Choice:  Durable Medical Equipment (w/c) Choice offered to:     DME Arranged:    DME Agency:     HH Arranged:    HH Agency:     Status of Service:  In process, will continue to follow  If discussed at Long Length of Stay Meetings, dates discussed:    Additional Comments:  Dessa Phi, RN 04/24/2017, 1:38 PM

## 2017-04-24 NOTE — Progress Notes (Signed)
MEDICATION-RELATED CONSULT NOTE   IR Procedure Consult - Anticoagulant/Antiplatelet PTA/Inpatient Med List Review by Pharmacist    Procedure: IVC filter placement    Completed: 04/24/17 at ~7PM  Post-Procedural bleeding risk per IR MD assessment:  low  Antithrombotic medications on inpatient or PTA profile prior to procedure:   Heparin drip    Recommended restart time per IR Post-Procedure Guidelines:  Resume 4 hrs after procedure   Other considerations:   Spoke to pt's nurse on 4E- stated that heparin drip was not stopped for procedure and drip is currently running at 1500 units/hr   Plan:     - will continue with heparin drip at 1500 units/hr since drug was not stopped for procedure.  Dia Sitter, PharmD, BCPS 04/24/2017 7:28 PM

## 2017-04-24 NOTE — Sedation Documentation (Signed)
Patient is resting comfortably. 

## 2017-04-24 NOTE — Procedures (Signed)
Interventional Radiology Procedure Note  Procedure: Placement of IVC filter.  Complications: None  Estimated Blood Loss: None  Recommendations: - Filter should be considered permanent  Signed,  Criselda Peaches, MD

## 2017-04-24 NOTE — Progress Notes (Signed)
*  PRELIMINARY RESULTS* Vascular Ultrasound Bilateral lower extremity venous duplex has been completed.  Preliminary findings: Left popliteal, posterior tibial and peroneal veins appear positive for deep vein thrombosis, other visualized veins of the lower extremities appear negative for DVT.  Negative for Baker's cysts bilaterally.  Preliminary results given to patients nurse, Baxter Flattery @ 10:54.  Everrett Coombe 04/24/2017, 10:53 AM

## 2017-04-24 NOTE — Progress Notes (Signed)
Pharmacy: Re- heparin  Patient's an 83 y.o F with metastattic neuroendocrine cancer currently on heparin drip for new PE and DVT -- s/p IVC filter today (Per RN, heparin drip was not stopped during procedure).  - heparin level now back therapeutic, but is at upper end of goal range with 0.69 (goal 0.3-0.7)  Plan: - Will decrease heparin drip slightly to 1450 units/hr - f/u with AM labs and adjust rate if needed - monitor for s/s bleeding   Dia Sitter, PharmD, BCPS 04/24/2017 8:02 PM

## 2017-04-25 ENCOUNTER — Ambulatory Visit: Payer: Medicare Other | Admitting: *Deleted

## 2017-04-25 ENCOUNTER — Telehealth: Payer: Self-pay | Admitting: Internal Medicine

## 2017-04-25 DIAGNOSIS — R002 Palpitations: Secondary | ICD-10-CM

## 2017-04-25 LAB — BASIC METABOLIC PANEL
Anion gap: 6 (ref 5–15)
BUN: 15 mg/dL (ref 6–20)
CHLORIDE: 107 mmol/L (ref 101–111)
CO2: 25 mmol/L (ref 22–32)
Calcium: 9.4 mg/dL (ref 8.9–10.3)
Creatinine, Ser: 0.92 mg/dL (ref 0.44–1.00)
GFR calc Af Amer: >60 mL/min (ref >60)
GFR calc non Af Amer: 57 mL/min — ABNORMAL LOW (ref >60)
GLUCOSE: 98 mg/dL (ref 65–99)
POTASSIUM: 4.1 mmol/L (ref 3.5–5.1)
Sodium: 138 mmol/L (ref 135–145)

## 2017-04-25 LAB — CBC
HCT: 33 % — ABNORMAL LOW (ref 36.0–46.0)
HEMOGLOBIN: 10.9 g/dL — AB (ref 12.0–15.0)
MCH: 34.7 pg — AB (ref 26.0–34.0)
MCHC: 33 g/dL (ref 30.0–36.0)
MCV: 105.1 fL — AB (ref 78.0–100.0)
Platelets: 116 10*3/uL — ABNORMAL LOW (ref 150–400)
RBC: 3.14 MIL/uL — AB (ref 3.87–5.11)
RDW: 16.8 % — ABNORMAL HIGH (ref 11.5–15.5)
WBC: 4.6 10*3/uL (ref 4.0–10.5)

## 2017-04-25 LAB — HEPARIN LEVEL (UNFRACTIONATED): Heparin Unfractionated: <0.1 IU/mL — ABNORMAL LOW (ref 0.30–0.70)

## 2017-04-25 NOTE — Discharge Summary (Addendum)
Physician Discharge Summary  [name] VQQ:[id number] DOB: [birth year] DOA: 04/23/2017  PCP: Eustaquio Maize, MD  Admit date: 04/23/2017 Discharge date: 04/25/2017  Admitted From: Home Disposition:  Home  Recommendations for Outpatient Follow-up:  1. Follow up with PCP in 1 week 2. Follow up with Dr. [name] on 10/2   Home Health: None, refused HH PT  Equipment/Devices: None   Discharge Condition: Stable CODE STATUS: Full  Diet recommendation: Heart healthy   Brief/Interim Summary: [name] a 83 y.o.femalewith medical history significant of HTN, neuroendocrine tumor of the lung with multiple liver metastaseson chemotherapy followed by Dr. Earlie Server, hypothyroidism, seizure disorder, cerebellar degeneration with ataxia, PAF not on anticoagulation; who presents after having routine CT scans of her chest and abdomen for evaluation of her cancer. CT revealed a filling defect of the right lower lung suggestive of pulmonary embolus. Patient was sent here for need of IVC filter. Patient has history of right facial AVM diagnosed in 26s which she was advised not to ever be placed on blood thinners long-term. She underwent lower extremity ultrasound, which revealed left-sided DVT. She was evaluated by IR for IVC filter which was placed on 9/26. This is considered permanent. Patient is not a candidate for anticoagulation going forward.  Discharge Diagnoses:  Principal Problem:   Pulmonary embolus [facility]) Active Problems:   Essential hypertension   Cerebellar degeneration   Depression   Hypothyroidism   Neuroendocrine carcinoma metastatic to liver [facility])   Pulmonary embolism ([name])   Pulmonary embolus with left LE DVT -Echocardiogram without RV strain  -IVC placed 9/26, considered permanent -No anticoagulation due to facial AVM   Neuroendocrine tumor with metastases to the liver -On chemotherapy of Temodar(starts back on 10/10)and Xeloda(starts back on 9/30)  -Follows  with Dr. Earlie Server  Intermittent sinus bradycardia with proxysmal A Fib -Reviewed Dr. Jackalyn Lombard note from 06/25/2016. No indication for EP intervention noted at that time as she has had no symptomatic transmissions from loop recorder. Patient is also wheelchair bound at baseline  -Follow up with Dr. Gwenlyn Found and Dr. Rayann Heman as outpatient   Essential hypertension  -Continue losartan   HLD -Continue pravachol  Hypothyroidism  -Continue levothyroxine   Seizure disorder  -Continue gabapentin   Depression  -Continue amitriptyline, lexapro   Cerebellar degeneration -Wheelchair bound at baseline. Wants to work with HHPT  GERD  -PPI    Discharge Instructions  Discharge Instructions    Call MD for:  extreme fatigue    Complete by:  As directed    Call MD for:  hives    Complete by:  As directed    Call MD for:  persistant dizziness or light-headedness    Complete by:  As directed    Call MD for:  persistant nausea and vomiting    Complete by:  As directed    Call MD for:  redness, tenderness, or signs of infection (pain, swelling, redness, odor or green/yellow discharge around incision site)    Complete by:  As directed    Call MD for:  severe uncontrolled pain    Complete by:  As directed    Call MD for:  temperature >100.4    Complete by:  As directed    Diet - low sodium heart healthy    Complete by:  As directed    Discharge instructions    Complete by:  As directed    You were cared for by a hospitalist during your hospital stay. If you have any questions about  your discharge medications or the care you received while you were in the hospital after you are discharged, you can call the unit and asked to speak with the hospitalist on call if the hospitalist that took care of you is not available. Once you are discharged, your primary care physician will handle any further medical issues. Please note that NO REFILLS for any discharge medications will be authorized once  you are discharged, as it is imperative that you return to your primary care physician (or establish a relationship with a primary care physician if you do not have one) for your aftercare needs so that they can reassess your need for medications and monitor your lab values.   Increase activity slowly    Complete by:  As directed      Allergies as of 04/25/2017      Reactions   Lisinopril Cough      Medication List    TAKE these medications   acetaminophen 500 MG tablet Commonly known as:  TYLENOL Take 1,000 mg by mouth every 6 (six) hours as needed.   amitriptyline 25 MG tablet Commonly known as:  ELAVIL TAKE 1 TABLET AT BEDTIME   capecitabine 500 MG tablet Commonly known as:  XELODA Take 3 tablets (1,500 mg total) by mouth 2 (two) times daily after a meal.   escitalopram 20 MG tablet Commonly known as:  LEXAPRO Take 1 tablet (20 mg total) by mouth at bedtime.   fluticasone 50 MCG/ACT nasal spray Commonly known as:  FLONASE Place 2 sprays into both nostrils at bedtime.   gabapentin 300 MG capsule Commonly known as:  NEURONTIN TAKE 1 TO 2 CAPSULES FOUR TIMES A DAY   hydroxypropyl methylcellulose / hypromellose 2.5 % ophthalmic solution Commonly known as:  ISOPTO TEARS / GONIOVISC Place 1 drop into both eyes at bedtime.   levothyroxine 75 MCG tablet Commonly known as:  SYNTHROID, LEVOTHROID TAKE 1 TABLET DAILY BEFORE BREAKFAST   losartan 50 MG tablet Commonly known as:  COZAAR TAKE 1 TABLET DAILY   lovastatin 40 MG tablet Commonly known as:  MEVACOR TAKE 1 TABLET AT BEDTIME   Melatonin 3 MG Tbdp Take 3-6 mg by mouth at bedtime as needed. What changed:  how much to take   nitrofurantoin 50 MG capsule Commonly known as:  MACRODANTIN Take 50 mg by mouth at bedtime.   omeprazole 40 MG capsule Commonly known as:  PRILOSEC TAKE 1 CAPSULE DAILY   ondansetron 8 MG tablet Commonly known as:  ZOFRAN Take 1 tablet (8 mg total) by mouth every 8 (eight) hours as  needed for nausea or vomiting.   oxyCODONE-acetaminophen 5-325 MG tablet Commonly known as:  PERCOCET Take 1-2 tablets by mouth every 4 (four) hours as needed.   temozolomide 140 MG capsule Commonly known as:  TEMODAR Take 2 capsules (280 mg total) by mouth daily. May take on an empty stomach or at bedtime to decrease nausea & vomiting.   Vitamin D (Ergocalciferol) 50000 units Caps capsule Commonly known as:  DRISDOL TAKE 1 CAPSULE EVERY 7 DAYS            Discharge Care Instructions        Start     Ordered   04/25/17 0000  Increase activity slowly     04/25/17 1136   04/25/17 0000  Diet - low sodium heart healthy     04/25/17 1136   04/25/17 0000  Discharge instructions    Comments:  You were cared for by  a hospitalist during your hospital stay. If you have any questions about your discharge medications or the care you received while you were in the hospital after you are discharged, you can call the unit and asked to speak with the hospitalist on call if the hospitalist that took care of you is not available. Once you are discharged, your primary care physician will handle any further medical issues. Please note that NO REFILLS for any discharge medications will be authorized once you are discharged, as it is imperative that you return to your primary care physician (or establish a relationship with a primary care physician if you do not have one) for your aftercare needs so that they can reassess your need for medications and monitor your lab values.   04/25/17 1136   04/25/17 0000  Call MD for:  extreme fatigue     04/25/17 1136   04/25/17 0000  Call MD for:  persistant dizziness or light-headedness     04/25/17 1136   04/25/17 0000  Call MD for:  hives     04/25/17 1136   04/25/17 0000  Call MD for:  severe uncontrolled pain     04/25/17 1136   04/25/17 0000  Call MD for:  persistant nausea and vomiting     04/25/17 1136   04/25/17 0000  Call MD for:  temperature >100.4      04/25/17 1136   04/25/17 0000  Call MD for:  redness, tenderness, or signs of infection (pain, swelling, redness, odor or green/yellow discharge around incision site)     04/25/17 1136     Follow-up Information    Eustaquio Maize, MD. Schedule an appointment as soon as possible for a visit in 1 week(s).   Specialty:  Pediatrics Contact information: [place] [address] [phone]        Curt Bears, MD. Go to.   Specialty:  Oncology Contact information: [address] [place] [address] [phone]          Allergies  Allergen Reactions  . Lisinopril Cough    Consultations:  IR   Procedures/Studies: Ct Chest W Contrast  Result Date: 04/23/2017 CLINICAL DATA:  Followup lung cancer. EXAM: CT CHEST, ABDOMEN, AND PELVIS WITH CONTRAST TECHNIQUE: Multidetector CT imaging of the chest, abdomen and pelvis was performed following the standard protocol during bolus administration of intravenous contrast. CONTRAST:  147mL ISOVUE-300 IOPAMIDOL (ISOVUE-300) INJECTION 61% COMPARISON:  CT abdomen and pelvis from 01/13/2017 and PET-CT from 10/02/2016. FINDINGS: CT CHEST FINDINGS Cardiovascular: The heart size is normal. Aortic atherosclerosis. Calcification within the LAD coronary artery identified. Filling defects within the right lower lobe pulmonary artery is identified compatible with pulmonary embolus, age indeterminate. Mediastinum/Nodes: The trachea appears patent and is midline. Normal appearance of the esophagus. No enlarged mediastinal or hilar lymph nodes. No supraclavicular adenopathy. Lungs/Pleura: No pleural effusion. Pulmonary nodule within the anteromedial right upper lobe measures 1.1 cm, image 41 of series 4. Unchanged from previous exam. There is a tiny nodule in the right upper lobe measuring 3 mm. New from previous exam. Anteromedial left upper lobe lung nodule is also new measuring 4 mm, image 22 of series 4. Within the left upper  lobe there is a nodule measuring 4 mm, image 44 of series 4. New from previous exam. Musculoskeletal: No aggressive lytic or sclerotic bone lesions identified. CT ABDOMEN PELVIS FINDINGS Hepatobiliary: Lateral segment of left lobe of liver lesion measures 2.1 cm, image 39 of series 5. On  the previous examination this measure 2.3 cm. Second lesion within the lateral segment of left lobe of liver measures 1.5 cm, image 46 of series 5. Previously this measured the same. Lesion within the lateral aspect of the right lobe of liver measures 2.1 cm, image 65 of series 5. Previously 2.4 cm. No new liver lesions identified. The gallbladder appears normal. Pancreas: Normal appearance of the pancreas. Spleen: The spleen is unremarkable. Adrenals/Urinary Tract: The adrenal glands are normal. Bilateral renal cortical scarring identified. There is a small stone within the inferior pole of the left kidney, image 79 of series 5. There are a few small low-density foci within the right kidney which are too small to reliably characterize. Stomach/Bowel: The stomach is normal. The small bowel loops have a normal course and caliber. No bowel obstruction. Normal appearance of the colon. Vascular/Lymphatic: Aortic atherosclerosis. No aneurysm. No abdominal or pelvic adenopathy. No inguinal adenopathy. Reproductive: Status post hysterectomy. No adnexal masses. Other: No abdominal wall hernia or abnormality. No abdominopelvic ascites. Musculoskeletal: Degenerative disc disease noted within the lumbar spine. No aggressive lytic or sclerotic bone lesions. IMPRESSION: 1. Stable appearance of multifocal liver lesions. 2. The dominant pulmonary nodule within the right upper lobe is not significantly changed from 10/02/2016. There are a few scattered tiny subcentimeter pulmonary nodules which were not confidently identified on previous imaging of the chest. 3. Filling defects within the right lower lobe pulmonary arteries identified compatible  with age indeterminate pulmonary emboli. Critical Value/emergent results were called by telephone at the time of interpretation on 04/23/2017 at 5:11 pm to Dr. Alvy Bimler , who verbally acknowledged these results. 4. Aortic Atherosclerosis (ICD10-I70.0). LAD coronary artery calcifications noted. 5. Nonobstructing left renal calculus. Electronically Signed   By: Kerby Moors M.D.   On: 04/23/2017 17:11   Ct Abdomen Pelvis W Contrast  Result Date: 04/23/2017 CLINICAL DATA:  Followup lung cancer. EXAM: CT CHEST, ABDOMEN, AND PELVIS WITH CONTRAST TECHNIQUE: Multidetector CT imaging of the chest, abdomen and pelvis was performed following the standard protocol during bolus administration of intravenous contrast. CONTRAST:  136mL ISOVUE-300 IOPAMIDOL (ISOVUE-300) INJECTION 61% COMPARISON:  CT abdomen and pelvis from 01/13/2017 and PET-CT from 10/02/2016. FINDINGS: CT CHEST FINDINGS Cardiovascular: The heart size is normal. Aortic atherosclerosis. Calcification within the LAD coronary artery identified. Filling defects within the right lower lobe pulmonary artery is identified compatible with pulmonary embolus, age indeterminate. Mediastinum/Nodes: The trachea appears patent and is midline. Normal appearance of the esophagus. No enlarged mediastinal or hilar lymph nodes. No supraclavicular adenopathy. Lungs/Pleura: No pleural effusion. Pulmonary nodule within the anteromedial right upper lobe measures 1.1 cm, image 41 of series 4. Unchanged from previous exam. There is a tiny nodule in the right upper lobe measuring 3 mm. New from previous exam. Anteromedial left upper lobe lung nodule is also new measuring 4 mm, image 22 of series 4. Within the left upper lobe there is a nodule measuring 4 mm, image 44 of series 4. New from previous exam. Musculoskeletal: No aggressive lytic or sclerotic bone lesions identified. CT ABDOMEN PELVIS FINDINGS Hepatobiliary: Lateral segment of left lobe of liver lesion measures 2.1 cm, image  39 of series 5. On the previous examination this measure 2.3 cm. Second lesion within the lateral segment of left lobe of liver measures 1.5 cm, image 46 of series 5. Previously this measured the same. Lesion within the lateral aspect of the right lobe of liver measures 2.1 cm, image 65 of series 5. Previously 2.4 cm. No new liver lesions  identified. The gallbladder appears normal. Pancreas: Normal appearance of the pancreas. Spleen: The spleen is unremarkable. Adrenals/Urinary Tract: The adrenal glands are normal. Bilateral renal cortical scarring identified. There is a small stone within the inferior pole of the left kidney, image 79 of series 5. There are a few small low-density foci within the right kidney which are too small to reliably characterize. Stomach/Bowel: The stomach is normal. The small bowel loops have a normal course and caliber. No bowel obstruction. Normal appearance of the colon. Vascular/Lymphatic: Aortic atherosclerosis. No aneurysm. No abdominal or pelvic adenopathy. No inguinal adenopathy. Reproductive: Status post hysterectomy. No adnexal masses. Other: No abdominal wall hernia or abnormality. No abdominopelvic ascites. Musculoskeletal: Degenerative disc disease noted within the lumbar spine. No aggressive lytic or sclerotic bone lesions. IMPRESSION: 1. Stable appearance of multifocal liver lesions. 2. The dominant pulmonary nodule within the right upper lobe is not significantly changed from 10/02/2016. There are a few scattered tiny subcentimeter pulmonary nodules which were not confidently identified on previous imaging of the chest. 3. Filling defects within the right lower lobe pulmonary arteries identified compatible with age indeterminate pulmonary emboli. Critical Value/emergent results were called by telephone at the time of interpretation on 04/23/2017 at 5:11 pm to Dr. Alvy Bimler , who verbally acknowledged these results. 4. Aortic Atherosclerosis (ICD10-I70.0). LAD coronary artery  calcifications noted. 5. Nonobstructing left renal calculus. Electronically Signed   By: Kerby Moors M.D.   On: 04/23/2017 17:11     [place] LE Summary:  - No evidence of deep vein thrombosis involving the visualized   veins of the right lower extremity. - Findings consistent with subacute deep vein thrombosis involving   the left popliteal, posterior tibial and peroneal veins of the   left lower extremity. - No evidence of Baker&'s cyst on the right or left.   Echo Study Conclusions  - Left ventricle: The cavity size was normal. Wall thickness was   increased in a pattern of mild LVH. Systolic function was normal.   The estimated ejection fraction was in the range of 60% to 65%.   Left ventricular diastolic function parameters were normal. - Aortic valve: There was mild regurgitation. - Impressions: Shadowing in the aortic arch on suprasternal views   but likely atherosclerotic disease  Impressions:  - Shadowing in the aortic arch on suprasternal views but likely   atherosclerotic disease  Discharge Exam: Vitals:   04/25/17 0635 04/25/17 1000  BP: (!) 128/56 (!) 117/57  Pulse: 69 73  Resp: 16 18  Temp: 98.6 F (37 C)   SpO2: 95% 94%   Vitals:   04/24/17 2115 04/24/17 2228 04/25/17 0635 04/25/17 1000  BP: (!) 105/45 (!) 111/50 (!) 128/56 (!) 117/57  Pulse:  71 69 73  Resp:  18 16 18   Temp:  99.2 F (37.3 C) 98.6 F (37 C)   TempSrc:  Oral Oral   SpO2:  97% 95% 94%  Weight:      Height:        General: Pt is alert, awake, not in acute distress Cardiovascular: RRR, S1/S2 +, no rubs, no gallops Respiratory: CTA bilaterally, no wheezing, no rhonchi Abdominal: Soft, NT, ND, bowel sounds + Extremities: no edema, no cyanosis    The results of significant diagnostics from this hospitalization (including imaging, microbiology, ancillary and laboratory) are listed below for reference.     Microbiology: No results found for this or any previous visit (from the  past 240 hour(s)).   Labs: BNP (last 3 results)  No results for input(s): BNP in the last 8760 hours. Basic Metabolic Panel:  Recent Labs Lab 04/23/17 1241 04/23/17 2128 04/24/17 0557 04/25/17 0540  NA 136 139 139 138  K 5.2* 4.8 4.1 4.1  CL  --  105 106 107  CO2 27 27 25 25   GLUCOSE 100 118* 104* 98  BUN 13.8 13 15 15   CREATININE 1.0 0.97 0.93 0.92  CALCIUM 10.4 10.0 9.9 9.4   Liver Function Tests:  Recent Labs Lab 04/23/17 1241  AST 35*  ALT 21  ALKPHOS 117  BILITOT 0.69  PROT 7.1  ALBUMIN 3.5   No results for input(s): LIPASE, AMYLASE in the last 168 hours. No results for input(s): AMMONIA in the last 168 hours. CBC:  Recent Labs Lab 04/23/17 1241 04/23/17 2128 04/24/17 0557 04/25/17 0540  WBC 5.4 6.3 6.2 4.6  NEUTROABS 3.2 3.8  --   --   HGB 12.9 12.2 11.3* 10.9*  HCT 38.1 36.5 33.1* 33.0*  MCV 105.8* 102.2* 103.1* 105.1*  PLT 130* 138* 114* 116*   Cardiac Enzymes: No results for input(s): CKTOTAL, CKMB, CKMBINDEX, TROPONINI in the last 168 hours. BNP: Invalid input(s): POCBNP CBG: No results for input(s): GLUCAP in the last 168 hours. D-Dimer No results for input(s): DDIMER in the last 72 hours. Hgb A1c No results for input(s): HGBA1C in the last 72 hours. Lipid Profile No results for input(s): CHOL, HDL, LDLCALC, TRIG, CHOLHDL, LDLDIRECT in the last 72 hours. Thyroid function studies No results for input(s): TSH, T4TOTAL, T3FREE, THYROIDAB in the last 72 hours.  Invalid input(s): FREET3 Anemia work up No results for input(s): VITAMINB12, FOLATE, FERRITIN, TIBC, IRON, RETICCTPCT in the last 72 hours. Urinalysis    Component Value Date/Time   COLORURINE YELLOW 01/13/2017 1901   APPEARANCEUR Clear 01/15/2017 1517   LABSPEC 1.010 01/13/2017 1901   PHURINE 6.5 01/13/2017 1901   GLUCOSEU Negative 01/15/2017 1517   HGBUR NEGATIVE 01/13/2017 1901   BILIRUBINUR Negative 01/15/2017 1517   KETONESUR NEGATIVE 01/13/2017 1901   PROTEINUR  Negative 01/15/2017 1517   PROTEINUR NEGATIVE 01/13/2017 1901   UROBILINOGEN negative 05/24/2015 1434   NITRITE Negative 01/15/2017 1517   NITRITE NEGATIVE 01/13/2017 1901   LEUKOCYTESUR 1+ (A) 01/15/2017 1517   Sepsis Labs Invalid input(s): PROCALCITONIN,  WBC,  LACTICIDVEN Microbiology No results found for this or any previous visit (from the past 240 hour(s)).   Time coordinating discharge: 40 minutes  SIGNED:  Dessa Phi, DO [facility] Pager [phone]  If 7PM-7AM, please contact night-coverage [email or web address] Password [facility] 04/25/2017, 11:46 AM

## 2017-04-25 NOTE — Progress Notes (Signed)
Patient/spouse declines any HHC-even though ordered-attending aware.

## 2017-04-25 NOTE — Care Management Note (Signed)
Case Management Note  Patient Details  Name: [name] MRN: [id number] Date of Birth: 1934/04/08  Subjective/Objective:  Per attending-d/c PT cons-therefore no d/c needs.                  Action/Plan:d/c home.   Expected Discharge Date:  04/25/17               Expected Discharge Plan:  Home/Self Care  In-House Referral:     Discharge planning Services  CM Consult  Post Acute Care Choice:  Durable Medical Equipment (w/c) Choice offered to:     DME Arranged:    DME Agency:     HH Arranged:    HH Agency:     Status of Service:  Completed, signed off  If discussed at [employer], dates discussed:    Additional Comments:  Dessa Phi, RN 04/25/2017, 1:08 PM

## 2017-04-25 NOTE — Progress Notes (Signed)
Noted PT cons-await recc. Also noted HHPT already ordered. Talking to patient/spouse in agreement to wait for cons since she is w/c bound,but needs recc for possible transfer from bed to chair.

## 2017-04-25 NOTE — Progress Notes (Signed)
D/C instructions reviewed w/ pt and husband, both verbalize understanding and all questions answered. Pt d/c in her own w/c in stable condition by NT to husband's car. Pt in possession of d/c instructions and all personal belongings.

## 2017-04-25 NOTE — Plan of Care (Signed)
Problem: Activity: Goal: Risk for activity intolerance will decrease Outcome: Progressing Await PT eval.

## 2017-04-25 NOTE — Progress Notes (Signed)
ANTICOAGULATION CONSULT NOTE - Follow Up Consult  Pharmacy Consult for Heparin Indication: pulmonary embolus and DVT  Allergies  Allergen Reactions  . Lisinopril Cough    Patient Measurements: Height: 5\' 4"  (162.6 cm) Weight: 223 lb 15.8 oz (101.6 kg) IBW/kg (Calculated) : 54.7 Heparin Dosing Weight:   Vital Signs: Temp: 99.2 F (37.3 C) (09/26 2228) Temp Source: Oral (09/26 2228) BP: 111/50 (09/26 2228) Pulse Rate: 71 (09/26 2228)  Labs:  Recent Labs  04/23/17 1241  04/23/17 2128 04/24/17 0557 04/24/17 1050 04/24/17 2011 04/25/17 0540  HGB 12.9  < > 12.2 11.3*  --   --  10.9*  HCT 38.1  --  36.5 33.1*  --   --  33.0*  PLT 130*  --  138* 114*  --   --  116*  APTT  --   --  27  --   --   --   --   LABPROT  --   --  13.8  --   --   --   --   INR  --   --  1.07  --   --   --   --   HEPARINUNFRC  --   --   --   --  0.15* 0.69 <0.10*  CREATININE 1.0  --  0.97 0.93  --   --   --   < > = values in this interval not displayed.  Estimated Creatinine Clearance: 55 mL/min (by C-G formula based on SCr of 0.93 mg/dL).   Medications:  Infusions:  . heparin 1,550 Units/hr (04/25/17 9675)    Assessment: Patient with low heparin level.  No heparin issues per RN.  This level being low, now makes the 0.69 level seem an error.  Will increase heparin.   Goal of Therapy:  Heparin level 0.3-0.7 units/ml Monitor platelets by anticoagulation protocol: Yes   Plan:  Increase heparin to 1550 units/hr Recheck level at [place], [place] 04/25/2017,6:35 AM

## 2017-04-25 NOTE — Telephone Encounter (Signed)
[employer] phone message on 04/23/17.   Message under media tab.

## 2017-04-26 ENCOUNTER — Telehealth: Payer: Self-pay | Admitting: Pediatrics

## 2017-04-26 NOTE — Progress Notes (Signed)
Carelink Summary Report / Loop Recorder 

## 2017-04-26 NOTE — Telephone Encounter (Signed)
appt scheduled Pt notified 

## 2017-04-29 ENCOUNTER — Encounter (HOSPITAL_COMMUNITY): Payer: Self-pay | Admitting: Interventional Radiology

## 2017-04-30 ENCOUNTER — Ambulatory Visit (HOSPITAL_BASED_OUTPATIENT_CLINIC_OR_DEPARTMENT_OTHER): Payer: Medicare Other | Admitting: Internal Medicine

## 2017-04-30 ENCOUNTER — Telehealth: Payer: Self-pay | Admitting: Internal Medicine

## 2017-04-30 ENCOUNTER — Encounter: Payer: Self-pay | Admitting: Internal Medicine

## 2017-04-30 VITALS — BP 130/61 | HR 75 | Temp 98.0°F | Resp 17 | Ht 64.0 in | Wt 226.4 lb

## 2017-04-30 DIAGNOSIS — C7A8 Other malignant neuroendocrine tumors: Secondary | ICD-10-CM | POA: Diagnosis not present

## 2017-04-30 DIAGNOSIS — I2699 Other pulmonary embolism without acute cor pulmonale: Secondary | ICD-10-CM | POA: Diagnosis not present

## 2017-04-30 DIAGNOSIS — C7B8 Other secondary neuroendocrine tumors: Secondary | ICD-10-CM | POA: Diagnosis not present

## 2017-04-30 DIAGNOSIS — Z7901 Long term (current) use of anticoagulants: Secondary | ICD-10-CM | POA: Diagnosis not present

## 2017-04-30 MED ORDER — RIVAROXABAN (XARELTO) VTE STARTER PACK (15 & 20 MG): ORAL_TABLET | ORAL | 0 refills | Status: DC

## 2017-04-30 NOTE — Telephone Encounter (Signed)
Scheduled appt per 10/2 los - Gave patient AVS and calender per los.  

## 2017-04-30 NOTE — Progress Notes (Signed)
[facility] [phone]   [phone]  OFFICE PROGRESS NOTE  [name], [place] [address]  DIAGNOSIS:  1) Metastatic intermediate. Neuroendocrine tumor of lung primary diagnosed in January 2018 and presented with small bilateral pulmonary nodules in addition to multiple liver metastasis. 2) right lower lobe pulmonary embolism diagnosed incidentally on CT scan of the chest on 04/23/2017  PRIOR THERAPY:  1) Status post radio embolization with Y 90 to the liver lesions by interventional radiology. 2) status post IVC filter placement by interventional radiology on 04/24/2017  CURRENT THERAPY: Xeloda 750 MG/M2 twice a day days 1-14 and Temodar 150 MG/M2 days 10-14 every 4 weeks. Status post 7 cycles. She is expected to start cycle #8 on 04/28/2017.  INTERVAL HISTORY: [name] 83 y.o. female returns to the clinic today for follow-up visit accompanied by her husband. The patient is feeling well today with no significant complaints. She continues to tolerate her treatment with Xeloda and Temodar fairly well. He is status post 7 cycles. She denied having any fever or chills. She has no nausea, vomiting, diarrhea or constipation. The patient denied having any chest pain, shortness of breath, cough or hemoptysis. She had repeat CT scan of the chest, abdomen and pelvis performed recently and she is here for evaluation and discussion of her scan results. Her scan of the chest showed evidence for pulmonary embolism in the right lower lobe. The patient underwent IVC filter placement by interventional radiology for concern about AV malformation. I questioned the patient today about any history of gastrointestinal bleeding and she declined. She denied having any other bleeding issues. Her personal history of the AV malformation was on the right side of the face. She continues to have swelling of the lower extremities.  MEDICAL  HISTORY: Past Medical History:  Diagnosis Date  . Acute respiratory failure with hypoxia ([place]) 10/23/2015  . Allergic rhinitis    PT. DENIES  . Anxiety   . Arthritis    NECK  . Ataxia   . Bradycardia    primarily nocturnal  . Burning tongue syndrome 25 years  . Cancer ([facility]) dx'd 06/2016   liver  . Cataract   . Cerebellar degeneration   . Chronic pericarditis   . Chronic urinary tract infection   . Complication of anesthesia    low o2 sats, coded 30 years ago  . CVA (cerebral infarction) 05/2003  . Depression   . Encounter for antineoplastic chemotherapy 10/09/2016  . Gait disorder   . Gastric polyps   . GERD (gastroesophageal reflux disease)   . Goals of care, counseling/discussion 10/09/2016  . High cholesterol   . Hyperlipidemia   . Hypertension   . Hypertension   . Hypotension   . Hypothyroidism   . IBS (irritable bowel syndrome)   . Obesity   . Paroxysmal atrial fibrillation ([name])    chads2vasc score is 6,  she is felt to be a poor candidate for anticoagulation  . Personal history of arterial venous malformation (AVM)    right side of face  . Seizure disorder ([facility])   . Seizures ([facility]) 2003   " smelling"- Gabapentin "no problem"  . Shortness of breath dyspnea    with exertion  . Sternum fx 10/27/2013  . Stroke [facility]) 5 years ago   Right side of face weak, slurred speach-   . Thyroid disease   . TIA (transient ischemic attack)   . UTI (lower  urinary tract infection) 03/27/2016   "frequently"    ALLERGIES:  is allergic to lisinopril.  MEDICATIONS:  Current Outpatient Prescriptions  Medication Sig Dispense Refill  . acetaminophen (TYLENOL) 500 MG tablet Take 1,000 mg by mouth every 6 (six) hours as needed.     Marland Kitchen amitriptyline (ELAVIL) 25 MG tablet TAKE 1 TABLET AT BEDTIME 90 tablet 0  . capecitabine (XELODA) 500 MG tablet Take 3 tablets (1,500 mg total) by mouth 2 (two) times daily after a meal. 84 tablet 2  . escitalopram (LEXAPRO) 20 MG tablet Take 1 tablet  (20 mg total) by mouth at bedtime. 90 tablet 1  . fluticasone (FLONASE) 50 MCG/ACT nasal spray Place 2 sprays into both nostrils at bedtime. (Patient not taking: Reported on 04/23/2017) 16 g 3  . gabapentin (NEURONTIN) 300 MG capsule TAKE 1 TO 2 CAPSULES FOUR TIMES A DAY 450 capsule 0  . hydroxypropyl methylcellulose / hypromellose (ISOPTO TEARS / GONIOVISC) 2.5 % ophthalmic solution Place 1 drop into both eyes at bedtime.    Marland Kitchen levothyroxine (SYNTHROID, LEVOTHROID) 75 MCG tablet TAKE 1 TABLET DAILY BEFORE BREAKFAST 90 tablet 1  . losartan (COZAAR) 50 MG tablet TAKE 1 TABLET DAILY 90 tablet 1  . lovastatin (MEVACOR) 40 MG tablet TAKE 1 TABLET AT BEDTIME 90 tablet 0  . Melatonin 3 MG TBDP Take 3-6 mg by mouth at bedtime as needed. (Patient taking differently: Take 6 mg by mouth at bedtime as needed. ) 60 tablet 1  . nitrofurantoin (MACRODANTIN) 50 MG capsule Take 50 mg by mouth at bedtime.     Marland Kitchen omeprazole (PRILOSEC) 40 MG capsule TAKE 1 CAPSULE DAILY 90 capsule 0  . ondansetron (ZOFRAN) 8 MG tablet Take 1 tablet (8 mg total) by mouth every 8 (eight) hours as needed for nausea or vomiting. 20 tablet 0  . oxyCODONE-acetaminophen (PERCOCET) 5-325 MG tablet Take 1-2 tablets by mouth every 4 (four) hours as needed. (Patient not taking: Reported on 02/18/2017) 15 tablet 0  . temozolomide (TEMODAR) 140 MG capsule Take 2 capsules (280 mg total) by mouth daily. May take on an empty stomach or at bedtime to decrease nausea & vomiting. 10 capsule 2  . Vitamin D, Ergocalciferol, (DRISDOL) 50000 units CAPS capsule TAKE 1 CAPSULE EVERY 7 DAYS 12 capsule 0   No current facility-administered medications for this visit.     SURGICAL HISTORY:  Past Surgical History:  Procedure Laterality Date  . APPENDECTOMY  83 years old  . COLONOSCOPY  2006, 2009  . COLONOSCOPY WITH PROPOFOL N/A 03/28/2016   Procedure: COLONOSCOPY WITH PROPOFOL;  Surgeon: Gatha Mayer, MD;  Location: [facility];  Service: Endoscopy;   Laterality: N/A;  . cyst removed  35 years ago  . EP IMPLANTABLE DEVICE N/A 01/06/2015   Procedure: Loop Recorder Insertion;  Surgeon: Thompson Grayer, MD;  Location: [facility];  Service: Cardiovascular;  Laterality: N/A;  . EYE SURGERY Right    Cataract  . IR ANGIOGRAM SELECTIVE EACH ADDITIONAL VESSEL  11/28/2016  . IR ANGIOGRAM SELECTIVE EACH ADDITIONAL VESSEL  11/28/2016  . IR ANGIOGRAM SELECTIVE EACH ADDITIONAL VESSEL  11/28/2016  . IR ANGIOGRAM SELECTIVE EACH ADDITIONAL VESSEL  11/28/2016  . IR ANGIOGRAM SELECTIVE EACH ADDITIONAL VESSEL  12/13/2016  . IR ANGIOGRAM SELECTIVE EACH ADDITIONAL VESSEL  12/13/2016  . IR ANGIOGRAM SELECTIVE EACH ADDITIONAL VESSEL  01/09/2017  . IR ANGIOGRAM VISCERAL SELECTIVE  11/28/2016  . IR ANGIOGRAM VISCERAL SELECTIVE  11/28/2016  . IR ANGIOGRAM VISCERAL SELECTIVE  12/13/2016  .  IR ANGIOGRAM VISCERAL SELECTIVE  12/13/2016  . IR ANGIOGRAM VISCERAL SELECTIVE  01/09/2017  . IR EMBO ARTERIAL NOT HEMORR HEMANG INC GUIDE ROADMAPPING  11/28/2016  . IR EMBO TUMOR ORGAN ISCHEMIA INFARCT INC GUIDE ROADMAPPING  12/13/2016  . IR EMBO TUMOR ORGAN ISCHEMIA INFARCT INC GUIDE ROADMAPPING  01/09/2017  . IR IVC FILTER PLMT / S&I /IMG GUID/MOD SED  04/24/2017  . IR RADIOLOGIST EVAL & MGMT  11/06/2016  . IR RADIOLOGIST EVAL & MGMT  01/02/2017  . IR US GUIDE VASC ACCESS RIGHT  11/28/2016  . IR US GUIDE VASC ACCESS RIGHT  12/13/2016  . IR US GUIDE VASC ACCESS RIGHT  01/09/2017  . KNEE ARTHROSCOPY Right 11/14/2006  . KNEE ARTHROSCOPY Bilateral 5 and 6 years ago  . KNEE ARTHROSCOPY WITH LATERAL MENISECTOMY  07/03/2012   Procedure: KNEE ARTHROSCOPY WITH LATERAL MENISECTOMY;  Surgeon: Magnus Sinning, MD;  Location: [facility];  Service: Orthopedics;  Laterality: Left;  with Partial Lateral Menisectomy and Medial Menisectomy. Shaving of medial and lateral femoral condyles. Shaving of patella. Removal of a loose body  . tibial and fibular internal fixation Left   . TOTAL ABDOMINAL HYSTERECTOMY  83 years  old  . UPPER GASTROINTESTINAL ENDOSCOPY  2009, 2013    REVIEW OF SYSTEMS:  A comprehensive review of systems was negative except for: Constitutional: positive for fatigue   PHYSICAL EXAMINATION: General appearance: alert, cooperative, fatigued and no distress Head: Normocephalic, without obvious abnormality, atraumatic Neck: no adenopathy, no JVD, supple, symmetrical, trachea midline and thyroid not enlarged, symmetric, no tenderness/mass/nodules Lymph nodes: Cervical, supraclavicular, and axillary nodes normal. Resp: clear to auscultation bilaterally Back: symmetric, no curvature. ROM normal. No CVA tenderness. Cardio: regular rate and rhythm, S1, S2 normal, no murmur, click, rub or gallop GI: soft, non-tender; bowel sounds normal; no masses,  no organomegaly Extremities: extremities normal, atraumatic, no cyanosis or edema  ECOG PERFORMANCE STATUS: 1 - Symptomatic but completely ambulatory  Blood pressure 130/61, pulse 75, temperature 98 F (36.7 C), temperature source Oral, resp. rate 17, height '5\' 4"'$  (1.626 m), weight 226 lb 6.4 oz (102.7 kg), SpO2 98 %.  LABORATORY DATA: Lab Results  Component Value Date   WBC 4.6 04/25/2017   HGB 10.9 (L) 04/25/2017   HCT 33.0 (L) 04/25/2017   MCV 105.1 (H) 04/25/2017   PLT 116 (L) 04/25/2017      Chemistry      Component Value Date/Time   NA 138 04/25/2017 0540   NA 136 04/23/2017 1241   K 4.1 04/25/2017 0540   K 5.2 (H) 04/23/2017 1241   CL 107 04/25/2017 0540   CO2 25 04/25/2017 0540   CO2 27 04/23/2017 1241   BUN 15 04/25/2017 0540   BUN 13.8 04/23/2017 1241   CREATININE 0.92 04/25/2017 0540   CREATININE 1.0 04/23/2017 1241      Component Value Date/Time   CALCIUM 9.4 04/25/2017 0540   CALCIUM 10.4 04/23/2017 1241   ALKPHOS 117 04/23/2017 1241   AST 35 (H) 04/23/2017 1241   ALT 21 04/23/2017 1241   BILITOT 0.69 04/23/2017 1241       RADIOGRAPHIC STUDIES: Ct Chest W Contrast  Result Date: 04/23/2017 CLINICAL DATA:   Followup lung cancer. EXAM: CT CHEST, ABDOMEN, AND PELVIS WITH CONTRAST TECHNIQUE: Multidetector CT imaging of the chest, abdomen and pelvis was performed following the standard protocol during bolus administration of intravenous contrast. CONTRAST:  160m ISOVUE-300 IOPAMIDOL (ISOVUE-300) INJECTION 61% COMPARISON:  CT abdomen and pelvis from 01/13/2017 and PET-CT from  10/02/2016. FINDINGS: CT CHEST FINDINGS Cardiovascular: The heart size is normal. Aortic atherosclerosis. Calcification within the LAD coronary artery identified. Filling defects within the right lower lobe pulmonary artery is identified compatible with pulmonary embolus, age indeterminate. Mediastinum/Nodes: The trachea appears patent and is midline. Normal appearance of the esophagus. No enlarged mediastinal or hilar lymph nodes. No supraclavicular adenopathy. Lungs/Pleura: No pleural effusion. Pulmonary nodule within the anteromedial right upper lobe measures 1.1 cm, image 41 of series 4. Unchanged from previous exam. There is a tiny nodule in the right upper lobe measuring 3 mm. New from previous exam. Anteromedial left upper lobe lung nodule is also new measuring 4 mm, image 22 of series 4. Within the left upper lobe there is a nodule measuring 4 mm, image 44 of series 4. New from previous exam. Musculoskeletal: No aggressive lytic or sclerotic bone lesions identified. CT ABDOMEN PELVIS FINDINGS Hepatobiliary: Lateral segment of left lobe of liver lesion measures 2.1 cm, image 39 of series 5. On the previous examination this measure 2.3 cm. Second lesion within the lateral segment of left lobe of liver measures 1.5 cm, image 46 of series 5. Previously this measured the same. Lesion within the lateral aspect of the right lobe of liver measures 2.1 cm, image 65 of series 5. Previously 2.4 cm. No new liver lesions identified. The gallbladder appears normal. Pancreas: Normal appearance of the pancreas. Spleen: The spleen is unremarkable.  Adrenals/Urinary Tract: The adrenal glands are normal. Bilateral renal cortical scarring identified. There is a small stone within the inferior pole of the left kidney, image 79 of series 5. There are a few small low-density foci within the right kidney which are too small to reliably characterize. Stomach/Bowel: The stomach is normal. The small bowel loops have a normal course and caliber. No bowel obstruction. Normal appearance of the colon. Vascular/Lymphatic: Aortic atherosclerosis. No aneurysm. No abdominal or pelvic adenopathy. No inguinal adenopathy. Reproductive: Status post hysterectomy. No adnexal masses. Other: No abdominal wall hernia or abnormality. No abdominopelvic ascites. Musculoskeletal: Degenerative disc disease noted within the lumbar spine. No aggressive lytic or sclerotic bone lesions. IMPRESSION: 1. Stable appearance of multifocal liver lesions. 2. The dominant pulmonary nodule within the right upper lobe is not significantly changed from 10/02/2016. There are a few scattered tiny subcentimeter pulmonary nodules which were not confidently identified on previous imaging of the chest. 3. Filling defects within the right lower lobe pulmonary arteries identified compatible with age indeterminate pulmonary emboli. Critical Value/emergent results were called by telephone at the time of interpretation on 04/23/2017 at 5:11 pm to Dr. Bertis Ruddy , who verbally acknowledged these results. 4. Aortic Atherosclerosis (ICD10-I70.0). LAD coronary artery calcifications noted. 5. Nonobstructing left renal calculus. Electronically Signed   By: Signa Kell M.D.   On: 04/23/2017 17:11   Ct Abdomen Pelvis W Contrast  Result Date: 04/23/2017 CLINICAL DATA:  Followup lung cancer. EXAM: CT CHEST, ABDOMEN, AND PELVIS WITH CONTRAST TECHNIQUE: Multidetector CT imaging of the chest, abdomen and pelvis was performed following the standard protocol during bolus administration of intravenous contrast. CONTRAST:   ISOVUE-300 IOPAMIDOL (ISOVUE-300) INJECTION 61% COMPARISON:  CT abdomen and pelvis from 01/13/2017 and PET-CT from 10/02/2016. FINDINGS: CT CHEST FINDINGS Cardiovascular: The heart size is normal. Aortic atherosclerosis. Calcification within the LAD coronary artery identified. Filling defects within the right lower lobe pulmonary artery is identified compatible with pulmonary embolus, age indeterminate. Mediastinum/Nodes: The trachea appears patent and is midline. Normal appearance of the esophagus. No enlarged mediastinal or hilar lymph nodes.  No supraclavicular adenopathy. Lungs/Pleura: No pleural effusion. Pulmonary nodule within the anteromedial right upper lobe measures 1.1 cm, image 41 of series 4. Unchanged from previous exam. There is a tiny nodule in the right upper lobe measuring 3 mm. New from previous exam. Anteromedial left upper lobe lung nodule is also new measuring 4 mm, image 22 of series 4. Within the left upper lobe there is a nodule measuring 4 mm, image 44 of series 4. New from previous exam. Musculoskeletal: No aggressive lytic or sclerotic bone lesions identified. CT ABDOMEN PELVIS FINDINGS Hepatobiliary: Lateral segment of left lobe of liver lesion measures 2.1 cm, image 39 of series 5. On the previous examination this measure 2.3 cm. Second lesion within the lateral segment of left lobe of liver measures 1.5 cm, image 46 of series 5. Previously this measured the same. Lesion within the lateral aspect of the right lobe of liver measures 2.1 cm, image 65 of series 5. Previously 2.4 cm. No new liver lesions identified. The gallbladder appears normal. Pancreas: Normal appearance of the pancreas. Spleen: The spleen is unremarkable. Adrenals/Urinary Tract: The adrenal glands are normal. Bilateral renal cortical scarring identified. There is a small stone within the inferior pole of the left kidney, image 79 of series 5. There are a few small low-density foci within the right kidney which are too  small to reliably characterize. Stomach/Bowel: The stomach is normal. The small bowel loops have a normal course and caliber. No bowel obstruction. Normal appearance of the colon. Vascular/Lymphatic: Aortic atherosclerosis. No aneurysm. No abdominal or pelvic adenopathy. No inguinal adenopathy. Reproductive: Status post hysterectomy. No adnexal masses. Other: No abdominal wall hernia or abnormality. No abdominopelvic ascites. Musculoskeletal: Degenerative disc disease noted within the lumbar spine. No aggressive lytic or sclerotic bone lesions. IMPRESSION: 1. Stable appearance of multifocal liver lesions. 2. The dominant pulmonary nodule within the right upper lobe is not significantly changed from 10/02/2016. There are a few scattered tiny subcentimeter pulmonary nodules which were not confidently identified on previous imaging of the chest. 3. Filling defects within the right lower lobe pulmonary arteries identified compatible with age indeterminate pulmonary emboli. Critical Value/emergent results were called by telephone at the time of interpretation on 04/23/2017 at 5:11 pm to Dr. Alvy Bimler , who verbally acknowledged these results. 4. Aortic Atherosclerosis (ICD10-I70.0). LAD coronary artery calcifications noted. 5. Nonobstructing left renal calculus. Electronically Signed   By: Kerby Moors M.D.   On: 04/23/2017 17:11   Ir Ivc Filter Plmt / S&i /img Guid/mod Sed  Result Date: 04/29/2017 INDICATION: 83 year old female with a history of arteriovenous malformation and new pulmonary emboli. She cannot be anti coagulated. The contraindication to anticoagulation is an indication for caval interruption for PE prophylaxis. This filter will be considered permanent. EXAM: ULTRASOUND GUIDANCE FOR VASCULARACCESS IVC CATHETERIZATION AND VENOGRAM IVC FILTER INSERTION Interventional Radiologist:  Criselda Peaches, MD MEDICATIONS: None. ANESTHESIA/SEDATION: Fentanyl 100 mcg IV; Versed 2 mg IV Moderate Sedation Time:   10 minutes The patient was continuously monitored during the procedure by the interventional radiology nurse under my direct supervision. FLUOROSCOPY TIME:  Fluoroscopy Time: 0 minutes 36 seconds (120 mGy). COMPLICATIONS: None immediate. PROCEDURE: Informed written consent was obtained from the patient after a thorough discussion of the procedural risks, benefits and alternatives. All questions were addressed. Maximal Sterile Barrier Technique was utilized including caps, mask, sterile gowns, sterile gloves, sterile drape, hand hygiene and skin antiseptic. A timeout was performed prior to the initiation of the procedure. Maximal barrier sterile technique utilized including  caps, mask, sterile gowns, sterile gloves, large sterile drape, hand hygiene, and Betadine prep. Under sterile condition and local anesthesia, right internal jugular venous access was performed with ultrasound. An ultrasound image was saved and sent to PACS. Over a guidewire, the IVC filter delivery sheath and inner dilator were advanced into the IVC just above the IVC bifurcation. Contrast injection was performed for an IVC venogram. Through the delivery sheath, a retrievable Denali IVC filter was deployed below the level of the renal veins and above the IVC bifurcation. Limited post deployment venacavagram was performed. The delivery sheath was removed and hemostasis was obtained with manual compression. A dressing was placed. The patient tolerated the procedure well without immediate post procedural complication. FINDINGS: The IVC is patent. No evidence of thrombus, stenosis, or occlusion. No variant venous anatomy. Successful placement of the IVC filter below the level of the renal veins. IMPRESSION: Successful ultrasound and fluoroscopically guided placement of an infrarenal retrievable IVC filter via right jugular approach. PLAN: Due to patient related comorbidities and/or clinical necessity, this IVC filter should be considered a permanent  device. This patient will not be actively followed for future filter retrieval. Electronically Signed   By: Jacqulynn Cadet M.D.   On: 04/29/2017 08:30    ASSESSMENT AND PLAN:  This is a white female with metastatic intermediate grade neuroendocrine carcinoma of questionable lung primary and multiple metastatic liver lesions and pancreatic lesions.She status post treatment with radio embolization with Y90 to the left and right lobe liver lesions.Status post treatment with Y 90 to the left and right lobes of the liver. She is currently undergoing systemic chemotherapy with Xeloda and Temodar status post 7 cycles. The patient related the last cycle of her treatment fairly well. She started cycle #8 on 04/28/2017. Repeat CT scan of the chest, abdomen and pelvis showed no clear evidence for disease progression except for new tiny pulmonary nodules that we will monitor closely.. I discussed the scan results with the patient and her husband and recommended for her to continue her current treatment with Xeloda and Temodar. Regarding the recent diagnosis of pulmonary embolism, the patient had IVC filter placed by interventional radiology but she continues to have swelling of the lower extremities. I discussed with her options for anticoagulation and the patient declined having any bleeding history in the past. She has a history of AV malformation but this was on the face. I discussed with the patient several options for anticoagulation including subcutaneous Lovenox and she declined that option. I gave her the option of treatment with either Xarelto or Coumadin and the patient is interested in treatment with Xarelto. I gave her prescription for Xarelto starter kit initially with 15 mg by mouth twice a day for 3 weeks followed by 20 mg by mouth daily. She was advised to call immediately if she has any bleeding issues. I would see her back for follow-up visit in one month for reevaluation with repeat blood  work. The patient was advised to call immediately if she has any concerning symptoms in the interval. The patient voices understanding of current disease status and treatment options and is in agreement with the current care plan. All questions were answered. The patient knows to call the clinic with any problems, questions or concerns. We can certainly see the patient much sooner if necessary.  Disclaimer: This note was dictated with voice recognition software. Similar sounding words can inadvertently be transcribed and may not be corrected upon review.

## 2017-05-02 ENCOUNTER — Ambulatory Visit
Admission: RE | Admit: 2017-05-02 | Discharge: 2017-05-02 | Disposition: A | Discharge status: Home / Self Care | Payer: Medicare Other | Source: Ambulatory Visit | Attending: Interventional Radiology | Admitting: Interventional Radiology

## 2017-05-02 DIAGNOSIS — R911 Solitary pulmonary nodule: Secondary | ICD-10-CM | POA: Diagnosis not present

## 2017-05-02 DIAGNOSIS — C787 Secondary malignant neoplasm of liver and intrahepatic bile duct: Secondary | ICD-10-CM

## 2017-05-02 DIAGNOSIS — I82409 Acute embolism and thrombosis of unspecified deep veins of unspecified lower extremity: Secondary | ICD-10-CM | POA: Diagnosis not present

## 2017-05-02 HISTORY — PX: IR RADIOLOGIST EVAL & MGMT: IMG5224

## 2017-05-02 NOTE — Progress Notes (Signed)
Patient ID: [name], female   DOB: 1934-07-09, 83 y.o.   MRN: [id number]        Chief Complaint: Post Y-90   Referring Physician(s): Mohammed  History of Present Illness: [name] is a 83 y.o. female with complex past medical history significant for bradycardia, paroxysmal atrial fibrillation, chronic pericarditis, CVA and cerebellar degeneration, hypertension and hyperlipidemia who underwent technically successful bilobar Y 90 radioembolization with last procedure performed on 01/09/2017. She returns today to discuss the results of her postprocedural surveillance CT scan of the chest abdomen and pelvis performed 04/23/2017.  Of note, patient was found to have pulmonary embolism on that surveillance chest CT with subsequent DVT ultrasound confirming lower extremity DVT and as such underwent IVC filter placement on 04/24/2017 by Dr. Laurence Ferrari. Patient has been prescribed anticoagulation and by Dr. Earlie Server, though has not yet filled the prescription secondary to her concern of taking anticoagulation setting of metastatic malignancy and a facial AVM.  Patient continues to complain of bilateral lower extremity edema, though states this has improved recently.  No lower extremity pain, chest pain or shortness of breath.  She is otherwise without complaint. She has had no recurrence of her previous unexplained episodes of insidious abdominal pain. No unintentional weight loss. Stable energy level.  No yellowing of the skin or eyes.   Past Medical History:  Diagnosis Date  . Acute respiratory failure with hypoxia ([place]) 10/23/2015  . Allergic rhinitis    PT. DENIES  . Anxiety   . Arthritis    NECK  . Ataxia   . Bradycardia    primarily nocturnal  . Burning tongue syndrome 25 years  . Cancer ([place]) dx'd 06/2016   liver  . Cataract   . Cerebellar degeneration   . Chronic pericarditis   . Chronic urinary tract infection   . Complication of anesthesia    low o2 sats, coded 30 years  ago  . CVA (cerebral infarction) 05/2003  . Depression   . Encounter for antineoplastic chemotherapy 10/09/2016  . Gait disorder   . Gastric polyps   . GERD (gastroesophageal reflux disease)   . Goals of care, counseling/discussion 10/09/2016  . High cholesterol   . Hyperlipidemia   . Hypertension   . Hypertension   . Hypotension   . Hypothyroidism   . IBS (irritable bowel syndrome)   . Obesity   . Paroxysmal atrial fibrillation ([name])    chads2vasc score is 6,  she is felt to be a poor candidate for anticoagulation  . Personal history of arterial venous malformation (AVM)    right side of face  . Seizure disorder ([place])   . Seizures ([place]) 2003   " smelling"- Gabapentin "no problem"  . Shortness of breath dyspnea    with exertion  . Sternum fx 10/27/2013  . Stroke [facility]) 5 years ago   Right side of face weak, slurred speach-   . Thyroid disease   . TIA (transient ischemic attack)   . UTI (lower urinary tract infection) 03/27/2016   "frequently"    Past Surgical History:  Procedure Laterality Date  . APPENDECTOMY  83 years old  . COLONOSCOPY  2006, 2009  . COLONOSCOPY WITH PROPOFOL N/A 03/28/2016   Procedure: COLONOSCOPY WITH PROPOFOL;  Surgeon: Gatha Mayer, MD;  Location: [facility];  Service: Endoscopy;  Laterality: N/A;  . cyst removed  35 years ago  . EP IMPLANTABLE DEVICE N/A 01/06/2015   Procedure: Loop Recorder Insertion;  Surgeon: Thompson Grayer, MD;  Location: [facility];  Service: Cardiovascular;  Laterality: N/A;  . EYE SURGERY Right    Cataract  . IR ANGIOGRAM SELECTIVE EACH ADDITIONAL VESSEL  11/28/2016  . IR ANGIOGRAM SELECTIVE EACH ADDITIONAL VESSEL  11/28/2016  . IR ANGIOGRAM SELECTIVE EACH ADDITIONAL VESSEL  11/28/2016  . IR ANGIOGRAM SELECTIVE EACH ADDITIONAL VESSEL  11/28/2016  . IR ANGIOGRAM SELECTIVE EACH ADDITIONAL VESSEL  12/13/2016  . IR ANGIOGRAM SELECTIVE EACH ADDITIONAL VESSEL  12/13/2016  . IR ANGIOGRAM SELECTIVE EACH ADDITIONAL VESSEL  01/09/2017    . IR ANGIOGRAM VISCERAL SELECTIVE  11/28/2016  . IR ANGIOGRAM VISCERAL SELECTIVE  11/28/2016  . IR ANGIOGRAM VISCERAL SELECTIVE  12/13/2016  . IR ANGIOGRAM VISCERAL SELECTIVE  12/13/2016  . IR ANGIOGRAM VISCERAL SELECTIVE  01/09/2017  . IR EMBO ARTERIAL NOT HEMORR HEMANG INC GUIDE ROADMAPPING  11/28/2016  . IR EMBO TUMOR ORGAN ISCHEMIA INFARCT INC GUIDE ROADMAPPING  12/13/2016  . IR EMBO TUMOR ORGAN ISCHEMIA INFARCT INC GUIDE ROADMAPPING  01/09/2017  . IR IVC FILTER PLMT / S&I /IMG GUID/MOD SED  04/24/2017  . IR RADIOLOGIST EVAL & MGMT  11/06/2016  . IR RADIOLOGIST EVAL & MGMT  01/02/2017  . IR US GUIDE VASC ACCESS RIGHT  11/28/2016  . IR US GUIDE VASC ACCESS RIGHT  12/13/2016  . IR US GUIDE VASC ACCESS RIGHT  01/09/2017  . KNEE ARTHROSCOPY Right 11/14/2006  . KNEE ARTHROSCOPY Bilateral 5 and 6 years ago  . KNEE ARTHROSCOPY WITH LATERAL MENISECTOMY  07/03/2012   Procedure: KNEE ARTHROSCOPY WITH LATERAL MENISECTOMY;  Surgeon: Magnus Sinning, MD;  Location: [facility];  Service: Orthopedics;  Laterality: Left;  with Partial Lateral Menisectomy and Medial Menisectomy. Shaving of medial and lateral femoral condyles. Shaving of patella. Removal of a loose body  . tibial and fibular internal fixation Left   . TOTAL ABDOMINAL HYSTERECTOMY  83 years old  . UPPER GASTROINTESTINAL ENDOSCOPY  2009, 2013    Allergies: Lisinopril  Medications: Prior to Admission medications   Medication Sig Start Date End Date Taking? Authorizing Provider  acetaminophen (TYLENOL) 500 MG tablet Take 1,000 mg by mouth every 6 (six) hours as needed.    Yes [provider]  amitriptyline (ELAVIL) 25 MG tablet TAKE 1 TABLET AT BEDTIME 03/28/17  Yes Eustaquio Maize, MD  capecitabine (XELODA) 500 MG tablet Take 3 tablets (1,500 mg total) by mouth 2 (two) times daily after a meal. 03/19/17  Yes Curt Bears, MD  escitalopram (LEXAPRO) 20 MG tablet Take 1 tablet (20 mg total) by mouth at bedtime. 10/01/16  Yes Eustaquio Maize, MD   fluticasone (FLONASE) 50 MCG/ACT nasal spray Place 2 sprays into both nostrils at bedtime. 09/20/16  Yes Eustaquio Maize, MD  gabapentin (NEURONTIN) 300 MG capsule TAKE 1 TO 2 CAPSULES FOUR TIMES A DAY 02/15/17  Yes Eustaquio Maize, MD  hydroxypropyl methylcellulose / hypromellose (ISOPTO TEARS / GONIOVISC) 2.5 % ophthalmic solution Place 1 drop into both eyes at bedtime.   Yes [provider]  levothyroxine (SYNTHROID, LEVOTHROID) 75 MCG tablet TAKE 1 TABLET DAILY BEFORE BREAKFAST 12/25/16  Yes Eustaquio Maize, MD  losartan (COZAAR) 50 MG tablet TAKE 1 TABLET DAILY 01/14/17  Yes Eustaquio Maize, MD  lovastatin (MEVACOR) 40 MG tablet TAKE 1 TABLET AT BEDTIME 04/23/17  Yes Eustaquio Maize, MD  Melatonin 3 MG TBDP Take 3-6 mg by mouth at bedtime as needed. Patient taking differently: Take 6 mg by mouth at bedtime as needed.  08/02/16  Yes Eustaquio Maize, MD  nitrofurantoin (MACRODANTIN) 50 MG capsule Take 50 mg by mouth at bedtime.  03/18/17  Yes [provider]  omeprazole (PRILOSEC) 40 MG capsule TAKE 1 CAPSULE DAILY 04/23/17  Yes Eustaquio Maize, MD  ondansetron (ZOFRAN) 8 MG tablet Take 1 tablet (8 mg total) by mouth every 8 (eight) hours as needed for nausea or vomiting. 03/19/17  Yes Curt Bears, MD  oxyCODONE-acetaminophen (PERCOCET) 5-325 MG tablet Take 1-2 tablets by mouth every 4 (four) hours as needed. 01/13/17  Yes Drenda Freeze, MD  temozolomide [facility]) 140 MG capsule Take 2 capsules (280 mg total) by mouth daily. May take on an empty stomach or at bedtime to decrease nausea & vomiting. 03/19/17  Yes Curt Bears, MD  Vitamin D, Ergocalciferol, (DRISDOL) 50000 units CAPS capsule TAKE 1 CAPSULE EVERY 7 DAYS 02/08/17  Yes Eustaquio Maize, MD  Rivaroxaban 15 & 20 MG TBPK Take as directed on package: Start with one 15mg  tablet by mouth twice a day with food. On Day 22, switch to one 20mg  tablet once a day with food. Patient not taking: Reported on 05/02/2017  04/30/17   Curt Bears, MD     Family History  Problem Relation Age of Onset  . Heart attack Father 70       fatal  . Coronary artery disease Brother   . Diabetes Brother   . Prostate cancer Brother   . Prostate cancer Son     Social History   Social History  . Marital status: Married    Spouse name: N/A  . Number of children: N/A  . Years of education: N/A   Social History Main Topics  . Smoking status: Former Smoker    Packs/day: 0.25    Years: 10.00    Types: Cigarettes    Quit date: 07/31/1975  . Smokeless tobacco: Never Used  . Alcohol use No     Comment: Rare- maybe a drink ever 2 years  . Drug use: No  . Sexual activity: Not on file   Other Topics Concern  . Not on file   Social History Narrative   Lives with husband, does have stairs, does not use them. Pt completed 10th grade.    ECOG Status: 2 - Symptomatic, <50% confined to bed  Review of Systems: A 12 point ROS discussed and pertinent positives are indicated in the HPI above.  All other systems are negative.  Review of Systems   Vital Signs: BP 119/64   Pulse 78   Temp 98.2 F (36.8 C) (Oral)   Resp 14   Ht 5\' 4"  (1.626 m)   Wt 220 lb (99.8 kg)   SpO2 97%   BMI 37.76 kg/m   Physical Exam   Imaging:  CT scan of the chest, abdomen and pelvis performed 04/23/2017 as well as planning CTA of the abdomen and pelvis performed 11/13/2016 was reviewed in detail.  By my comparison, the dominant lesion within the right lobe of the liver has decreased in size and interval, currently measuring 2.4 x 1.8 cm, previously, 3.1 x 2.9 cm.  Additionally, the somewhat ill-defined dominant lesion within the caudal aspect of the left of the liver has slightly decreased in size, currently measuring 2.0 x 1.8 cm, previously 2.2 x 2.2c m.  The adjacent hypoattenuating lesion within the dome of the left lobe of liver is unchanged measuring approximate 1.5 cm in diameter.   No new discrete hepatic  lesions.  While the dominant  approximately 1.1 cm lesion within the right upper lobe is unchanged, there is been potential development of a punctate (approximately 3 mm) nodule within the right lung apex.   The additional questioned punctate (4 mm) left lower lobe nodule may have been seen in hindsight compared to remote examination performed 05/10/2015.   Ct Chest W Contrast  Result Date: 04/23/2017 CLINICAL DATA:  Followup lung cancer. EXAM: CT CHEST, ABDOMEN, AND PELVIS WITH CONTRAST TECHNIQUE: Multidetector CT imaging of the chest, abdomen and pelvis was performed following the standard protocol during bolus administration of intravenous contrast. CONTRAST:  175mL ISOVUE-300 IOPAMIDOL (ISOVUE-300) INJECTION 61% COMPARISON:  CT abdomen and pelvis from 01/13/2017 and PET-CT from 10/02/2016. FINDINGS: CT CHEST FINDINGS Cardiovascular: The heart size is normal. Aortic atherosclerosis. Calcification within the LAD coronary artery identified. Filling defects within the right lower lobe pulmonary artery is identified compatible with pulmonary embolus, age indeterminate. Mediastinum/Nodes: The trachea appears patent and is midline. Normal appearance of the esophagus. No enlarged mediastinal or hilar lymph nodes. No supraclavicular adenopathy. Lungs/Pleura: No pleural effusion. Pulmonary nodule within the anteromedial right upper lobe measures 1.1 cm, image 41 of series 4. Unchanged from previous exam. There is a tiny nodule in the right upper lobe measuring 3 mm. New from previous exam. Anteromedial left upper lobe lung nodule is also new measuring 4 mm, image 22 of series 4. Within the left upper lobe there is a nodule measuring 4 mm, image 44 of series 4. New from previous exam. Musculoskeletal: No aggressive lytic or sclerotic bone lesions identified. CT ABDOMEN PELVIS FINDINGS Hepatobiliary: Lateral segment of left lobe of liver lesion measures 2.1 cm, image 39 of series 5. On the previous examination this  measure 2.3 cm. Second lesion within the lateral segment of left lobe of liver measures 1.5 cm, image 46 of series 5. Previously this measured the same. Lesion within the lateral aspect of the right lobe of liver measures 2.1 cm, image 65 of series 5. Previously 2.4 cm. No new liver lesions identified. The gallbladder appears normal. Pancreas: Normal appearance of the pancreas. Spleen: The spleen is unremarkable. Adrenals/Urinary Tract: The adrenal glands are normal. Bilateral renal cortical scarring identified. There is a small stone within the inferior pole of the left kidney, image 79 of series 5. There are a few small low-density foci within the right kidney which are too small to reliably characterize. Stomach/Bowel: The stomach is normal. The small bowel loops have a normal course and caliber. No bowel obstruction. Normal appearance of the colon. Vascular/Lymphatic: Aortic atherosclerosis. No aneurysm. No abdominal or pelvic adenopathy. No inguinal adenopathy. Reproductive: Status post hysterectomy. No adnexal masses. Other: No abdominal wall hernia or abnormality. No abdominopelvic ascites. Musculoskeletal: Degenerative disc disease noted within the lumbar spine. No aggressive lytic or sclerotic bone lesions. IMPRESSION: 1. Stable appearance of multifocal liver lesions. 2. The dominant pulmonary nodule within the right upper lobe is not significantly changed from 10/02/2016. There are a few scattered tiny subcentimeter pulmonary nodules which were not confidently identified on previous imaging of the chest. 3. Filling defects within the right lower lobe pulmonary arteries identified compatible with age indeterminate pulmonary emboli. Critical Value/emergent results were called by telephone at the time of interpretation on 04/23/2017 at 5:11 pm to Dr. Alvy Bimler , who verbally acknowledged these results. 4. Aortic Atherosclerosis (ICD10-I70.0). LAD coronary artery calcifications noted. 5. Nonobstructing left renal  calculus. Electronically Signed   By: Kerby Moors M.D.   On: 04/23/2017 17:11   Ct Abdomen Pelvis W  Contrast  Result Date: 04/23/2017 CLINICAL DATA:  Followup lung cancer. EXAM: CT CHEST, ABDOMEN, AND PELVIS WITH CONTRAST TECHNIQUE: Multidetector CT imaging of the chest, abdomen and pelvis was performed following the standard protocol during bolus administration of intravenous contrast. CONTRAST:  132mL ISOVUE-300 IOPAMIDOL (ISOVUE-300) INJECTION 61% COMPARISON:  CT abdomen and pelvis from 01/13/2017 and PET-CT from 10/02/2016. FINDINGS: CT CHEST FINDINGS Cardiovascular: The heart size is normal. Aortic atherosclerosis. Calcification within the LAD coronary artery identified. Filling defects within the right lower lobe pulmonary artery is identified compatible with pulmonary embolus, age indeterminate. Mediastinum/Nodes: The trachea appears patent and is midline. Normal appearance of the esophagus. No enlarged mediastinal or hilar lymph nodes. No supraclavicular adenopathy. Lungs/Pleura: No pleural effusion. Pulmonary nodule within the anteromedial right upper lobe measures 1.1 cm, image 41 of series 4. Unchanged from previous exam. There is a tiny nodule in the right upper lobe measuring 3 mm. New from previous exam. Anteromedial left upper lobe lung nodule is also new measuring 4 mm, image 22 of series 4. Within the left upper lobe there is a nodule measuring 4 mm, image 44 of series 4. New from previous exam. Musculoskeletal: No aggressive lytic or sclerotic bone lesions identified. CT ABDOMEN PELVIS FINDINGS Hepatobiliary: Lateral segment of left lobe of liver lesion measures 2.1 cm, image 39 of series 5. On the previous examination this measure 2.3 cm. Second lesion within the lateral segment of left lobe of liver measures 1.5 cm, image 46 of series 5. Previously this measured the same. Lesion within the lateral aspect of the right lobe of liver measures 2.1 cm, image 65 of series 5. Previously 2.4 cm.  No new liver lesions identified. The gallbladder appears normal. Pancreas: Normal appearance of the pancreas. Spleen: The spleen is unremarkable. Adrenals/Urinary Tract: The adrenal glands are normal. Bilateral renal cortical scarring identified. There is a small stone within the inferior pole of the left kidney, image 79 of series 5. There are a few small low-density foci within the right kidney which are too small to reliably characterize. Stomach/Bowel: The stomach is normal. The small bowel loops have a normal course and caliber. No bowel obstruction. Normal appearance of the colon. Vascular/Lymphatic: Aortic atherosclerosis. No aneurysm. No abdominal or pelvic adenopathy. No inguinal adenopathy. Reproductive: Status post hysterectomy. No adnexal masses. Other: No abdominal wall hernia or abnormality. No abdominopelvic ascites. Musculoskeletal: Degenerative disc disease noted within the lumbar spine. No aggressive lytic or sclerotic bone lesions. IMPRESSION: 1. Stable appearance of multifocal liver lesions. 2. The dominant pulmonary nodule within the right upper lobe is not significantly changed from 10/02/2016. There are a few scattered tiny subcentimeter pulmonary nodules which were not confidently identified on previous imaging of the chest. 3. Filling defects within the right lower lobe pulmonary arteries identified compatible with age indeterminate pulmonary emboli. Critical Value/emergent results were called by telephone at the time of interpretation on 04/23/2017 at 5:11 pm to Dr. Alvy Bimler , who verbally acknowledged these results. 4. Aortic Atherosclerosis (ICD10-I70.0). LAD coronary artery calcifications noted. 5. Nonobstructing left renal calculus. Electronically Signed   By: Kerby Moors M.D.   On: 04/23/2017 17:11   Ir Ivc Filter Plmt / S&i /img Guid/mod Sed  Result Date: 04/29/2017 INDICATION: 83 year old female with a history of arteriovenous malformation and new pulmonary emboli. She cannot be  anti coagulated. The contraindication to anticoagulation is an indication for caval interruption for PE prophylaxis. This filter will be considered permanent. EXAM: ULTRASOUND GUIDANCE FOR VASCULARACCESS IVC CATHETERIZATION AND VENOGRAM IVC FILTER  INSERTION Interventional Radiologist:  Criselda Peaches, MD MEDICATIONS: None. ANESTHESIA/SEDATION: Fentanyl 100 mcg IV; Versed 2 mg IV Moderate Sedation Time:  10 minutes The patient was continuously monitored during the procedure by the interventional radiology nurse under my direct supervision. FLUOROSCOPY TIME:  Fluoroscopy Time: 0 minutes 36 seconds (120 mGy). COMPLICATIONS: None immediate. PROCEDURE: Informed written consent was obtained from the patient after a thorough discussion of the procedural risks, benefits and alternatives. All questions were addressed. Maximal Sterile Barrier Technique was utilized including caps, mask, sterile gowns, sterile gloves, sterile drape, hand hygiene and skin antiseptic. A timeout was performed prior to the initiation of the procedure. Maximal barrier sterile technique utilized including caps, mask, sterile gowns, sterile gloves, large sterile drape, hand hygiene, and Betadine prep. Under sterile condition and local anesthesia, right internal jugular venous access was performed with ultrasound. An ultrasound image was saved and sent to PACS. Over a guidewire, the IVC filter delivery sheath and inner dilator were advanced into the IVC just above the IVC bifurcation. Contrast injection was performed for an IVC venogram. Through the delivery sheath, a retrievable Denali IVC filter was deployed below the level of the renal veins and above the IVC bifurcation. Limited post deployment venacavagram was performed. The delivery sheath was removed and hemostasis was obtained with manual compression. A dressing was placed. The patient tolerated the procedure well without immediate post procedural complication. FINDINGS: The IVC is  patent. No evidence of thrombus, stenosis, or occlusion. No variant venous anatomy. Successful placement of the IVC filter below the level of the renal veins. IMPRESSION: Successful ultrasound and fluoroscopically guided placement of an infrarenal retrievable IVC filter via right jugular approach. PLAN: Due to patient related comorbidities and/or clinical necessity, this IVC filter should be considered a permanent device. This patient will not be actively followed for future filter retrieval. Electronically Signed   By: Jacqulynn Cadet M.D.   On: 04/29/2017 08:30    Labs:  CBC:  Recent Labs  04/23/17 1241 04/23/17 2128 04/24/17 0557 04/25/17 0540  WBC 5.4 6.3 6.2 4.6  HGB 12.9 12.2 11.3* 10.9*  HCT 38.1 36.5 33.1* 33.0*  PLT 130* 138* 114* 116*    COAGS:  Recent Labs  08/20/16 1244 11/28/16 0743 01/09/17 0744 04/23/17 2128  INR 1.07 1.04 0.98 1.07  APTT 27  --   --  27    BMP:  Recent Labs  01/25/17 1523  04/23/17 1241 04/23/17 2128 04/24/17 0557 04/25/17 0540  NA 135  < > 136 139 139 138  K 5.0  < > 5.2* 4.8 4.1 4.1  CL 97  --   --  105 106 107  CO2 23  < > 27 27 25 25   GLUCOSE 93  < > 100 118* 104* 98  BUN 13  < > 13.8 13 15 15   CALCIUM 10.3  < > 10.4 10.0 9.9 9.4  CREATININE 1.15*  < > 1.0 0.97 0.93 0.92  GFRNONAA 45*  --   --  53* 56* 57*  GFRAA 52*  --   --  >60 >60 >60  < > = values in this interval not displayed.  LIVER FUNCTION TESTS:  Recent Labs  01/25/17 1523 02/18/17 1153 03/19/17 1102 04/23/17 1241  BILITOT 0.5 0.69 0.57 0.69  AST 31 47* 59* 35*  ALT 22 42 54 21  ALKPHOS 109 99 118 117  PROT 7.3 6.4 6.6 7.1  ALBUMIN 4.3 3.3* 3.3* 3.5    TUMOR MARKERS:  Recent Labs  12/13/16 0801  CHROMGRNA 15*    Assessment and Plan:  EMLYN MAVES is a 83 y.o. female with complex past medical history significant for bradycardia, paroxysmal atrial fibrillation, chronic pericarditis, CVA and cerebellar degeneration, hypertension and  hyperlipidemia who underwent technically successful bilobar Y 90 radioembolization with last procedure performed on 01/09/2017. She returns today to discuss the results of her postprocedural surveillance CT scan of the chest abdomen and pelvis performed 04/23/2017.  CT scan of the chest, abdomen and pelvis performed 04/23/2017 as well as planning CTA of the abdomen and pelvis performed 11/13/2016 was reviewed in detail.  By my comparison, the dominant lesion within the right lobe of the liver has decreased in size and interval, currently measuring 2.4 x 1.8 cm, previously, 3.1 x 2.9 cm.  Additionally, the somewhat ill-defined dominant lesion within the caudal aspect of the left of the liver has slightly decreased in size, currently measuring 2.0 x 1.8 cm, previously 2.2 x 2.2c m.  The adjacent hypoattenuating lesion within the dome of the left lobe of liver is unchanged measuring approximate 1.5 cm in diameter.  No new discrete hepatic lesions.  While the dominant approximately 1.1 cm lesion within the right upper lobe is unchanged, there is been potential development of a punctate (approximately 3 mm) nodule within the right lung apex.   The additional questioned punctate (4 mm) left lower lobe nodule may have been seen in hindsight compared to remote examination performed 05/10/2015.  The above findings were discussed in detail with the patient and the patient's husband who demonstrated excellent understanding.  Given apparent response to treatment, repeat dedicated intervention is not required at this time.    Patient will return to the interventional radiology clinic in 3 months (late December, early January) following the acquisition of a additional surveillance CT scan of the chest, abdomen and pelvis (the patient is somewhat concerned about the potentially new nodule in the right upper lobe, although I stressed that this nodule is of doubtful clinical significance).  I explained that if hepatic  disease were to progress in the future, she is likely a candidate for repeat Y 90 radioembolization as she has not yet reached her lifetime radiation threshold.  In regards to her lower extremity DVT, I offered the patient compression hoses however she states she has tried these in the past and they have cause significant lower extremity bruising. As was dictated by Dr. Geroge Baseman, given the patient's medical comorbidities, the IVC filter should be considered a permanent the device and she will NOT be actively followed for filter retrieval.   Patient was encouraged to call the interventional radiology clinic with any interval questions or concerns.  Thank you for this interesting consult.  I greatly enjoyed meeting BREELYN ICARD and look forward to participating in their care.  A copy of this report was sent to the requesting provider on this date.  Electronically Signed: Sandi Mariscal 05/02/2017, 3:32 PM   I spent a total of 25 Minutes in face to face in clinical consultation, greater than 50% of which was counseling/coordinating care for Post Y-90 radioembolization.

## 2017-05-03 ENCOUNTER — Other Ambulatory Visit: Payer: Self-pay | Admitting: Pediatrics

## 2017-05-03 DIAGNOSIS — E559 Vitamin D deficiency, unspecified: Secondary | ICD-10-CM

## 2017-05-07 LAB — CUP PACEART REMOTE DEVICE CHECK
Date Time Interrogation Session: 20180927221005
Implantable Pulse Generator Implant Date: 20160609

## 2017-05-08 ENCOUNTER — Ambulatory Visit (INDEPENDENT_AMBULATORY_CARE_PROVIDER_SITE_OTHER): Payer: Medicare Other | Admitting: Pediatrics

## 2017-05-08 ENCOUNTER — Encounter: Payer: Self-pay | Admitting: Pediatrics

## 2017-05-08 VITALS — BP 115/65 | HR 65 | Temp 97.0°F | Ht 64.0 in | Wt 227.0 lb

## 2017-05-08 DIAGNOSIS — I2699 Other pulmonary embolism without acute cor pulmonale: Secondary | ICD-10-CM | POA: Diagnosis not present

## 2017-05-08 DIAGNOSIS — I10 Essential (primary) hypertension: Secondary | ICD-10-CM

## 2017-05-08 DIAGNOSIS — C7A8 Other malignant neuroendocrine tumors: Secondary | ICD-10-CM

## 2017-05-08 DIAGNOSIS — R110 Nausea: Secondary | ICD-10-CM

## 2017-05-08 DIAGNOSIS — C7B8 Other secondary neuroendocrine tumors: Secondary | ICD-10-CM

## 2017-05-08 MED ORDER — ONDANSETRON HCL 8 MG PO TABS: 8.0000 mg | ORAL_TABLET | Freq: Three times a day (TID) | ORAL | 0 refills | Status: DC | PRN

## 2017-05-08 NOTE — Progress Notes (Signed)
  Subjective:   Patient ID: [name], female    DOB: [birth year], 83 y.o.   MRN: [id number] CC: Hospitalization Follow-up  HPI: [name] is a 83 y.o. female presenting for Hospitalization Follow-up  Recent admission with PE Pt with following with oncology for neuroendocrine tumor, currently treated with chemotherapy Had IV filter placed Has h/o AVM on face per pt IR angiogram in 2010 with no sign of AVM Pt says symptoms come and go, not there all the time Was very nervous about starting anticoagulation so has not yet Had f/u appt after hospitalization with oncologist, recommended starting xarelto Pt has not yet started medication, remains nervous about the medication No pain in legs, abd, chest Breathing has been fine Appetite is good Sometimes feels nauseous in the morning, improves later on in the day Antiemetic helps  Here today with her husband Able to continue normal activities Is wheelchair bound due to cerebellar degeneration  Relevant past medical, surgical, family and social history reviewed. Allergies and medications reviewed and updated. History  Smoking Status  . Former Smoker  . Packs/day: 0.25  . Years: 10.00  . Types: Cigarettes  . Quit date: 07/31/1975  Smokeless Tobacco  . Never Used   ROS: Per HPI   Objective:    BP 115/65   Pulse 65   Temp (!) 97 F (36.1 C) (Oral)   Ht 5\' 4"  (1.626 m)   Wt 227 lb (103 kg)   BMI 38.96 kg/m   Wt Readings from Last 3 Encounters:  05/08/17 227 lb (103 kg)  05/02/17 220 lb (99.8 kg)  04/30/17 226 lb 6.4 oz (102.7 kg)    Gen: NAD, alert, cooperative with exam, NCAT EYES: EOMI, no conjunctival injection, or no icterus ENT:  OP without erythema LYMPH: no cervical LAD CV: NRRR, normal S1/S2 Resp: CTABL, no wheezes, normal WOB Abd: +BS, soft, NTND. no guarding or organomegaly Ext: No edema, warm Neuro: Alert and oriented  Assessment & Plan:  [place] was seen today for hospitalization  follow-up.  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   Follow up plan: Return in about 3 months (around 08/08/2017). Assunta Found, MD [place]

## 2017-05-13 ENCOUNTER — Other Ambulatory Visit: Payer: Self-pay | Admitting: Pharmacist

## 2017-05-13 ENCOUNTER — Encounter: Payer: Self-pay | Admitting: Interventional Radiology

## 2017-05-16 MED FILL — TEMOZOLOMIDE 140 MG CAPSULE: 140 | 5 days supply | Qty: 10 | Fill #2

## 2017-05-16 MED FILL — CAPECITABINE 500 MG TABLET: 500 | 14 days supply | Qty: 84 | Fill #2

## 2017-05-27 ENCOUNTER — Ambulatory Visit (INDEPENDENT_AMBULATORY_CARE_PROVIDER_SITE_OTHER): Payer: Medicare Other | Admitting: *Deleted

## 2017-05-27 DIAGNOSIS — R002 Palpitations: Secondary | ICD-10-CM

## 2017-05-27 NOTE — Progress Notes (Signed)
Carelink Summary Report / Loop Recorder 

## 2017-05-29 ENCOUNTER — Encounter: Payer: Self-pay | Admitting: Interventional Radiology

## 2017-05-30 LAB — CUP PACEART REMOTE DEVICE CHECK
Implantable Pulse Generator Implant Date: 20160609
MDC IDC SESS DTM: 20181027224134

## 2017-05-31 ENCOUNTER — Ambulatory Visit: Payer: Medicare Other | Admitting: Cardiovascular Disease

## 2017-06-10 ENCOUNTER — Telehealth: Payer: Self-pay | Admitting: Internal Medicine

## 2017-06-10 ENCOUNTER — Ambulatory Visit (HOSPITAL_BASED_OUTPATIENT_CLINIC_OR_DEPARTMENT_OTHER): Payer: Medicare Other | Admitting: Internal Medicine

## 2017-06-10 ENCOUNTER — Encounter: Payer: Self-pay | Admitting: Internal Medicine

## 2017-06-10 ENCOUNTER — Other Ambulatory Visit (HOSPITAL_BASED_OUTPATIENT_CLINIC_OR_DEPARTMENT_OTHER): Payer: Medicare Other

## 2017-06-10 ENCOUNTER — Other Ambulatory Visit: Payer: Self-pay | Admitting: Internal Medicine

## 2017-06-10 DIAGNOSIS — Z5111 Encounter for antineoplastic chemotherapy: Secondary | ICD-10-CM

## 2017-06-10 DIAGNOSIS — I10 Essential (primary) hypertension: Secondary | ICD-10-CM

## 2017-06-10 DIAGNOSIS — C7B8 Other secondary neuroendocrine tumors: Secondary | ICD-10-CM

## 2017-06-10 DIAGNOSIS — I2699 Other pulmonary embolism without acute cor pulmonale: Secondary | ICD-10-CM | POA: Diagnosis not present

## 2017-06-10 DIAGNOSIS — C7A8 Other malignant neuroendocrine tumors: Secondary | ICD-10-CM

## 2017-06-10 DIAGNOSIS — C787 Secondary malignant neoplasm of liver and intrahepatic bile duct: Secondary | ICD-10-CM

## 2017-06-10 DIAGNOSIS — Z7901 Long term (current) use of anticoagulants: Secondary | ICD-10-CM

## 2017-06-10 LAB — CBC WITH DIFFERENTIAL/PLATELET
BASO%: 0.5 % (ref 0.0–2.0)
BASOS ABS: 0 10*3/uL (ref 0.0–0.1)
EOS ABS: 0.2 10*3/uL (ref 0.0–0.5)
EOS%: 6 % (ref 0.0–7.0)
HEMATOCRIT: 37.8 % (ref 34.8–46.6)
HGB: 12.3 g/dL (ref 11.6–15.9)
LYMPH%: 18.6 % (ref 14.0–49.7)
MCH: 35.1 pg — ABNORMAL HIGH (ref 25.1–34.0)
MCHC: 32.5 g/dL (ref 31.5–36.0)
MCV: 108 fL — AB (ref 79.5–101.0)
MONO#: 0.6 10*3/uL (ref 0.1–0.9)
MONO%: 15.6 % — ABNORMAL HIGH (ref 0.0–14.0)
NEUT#: 2.2 10*3/uL (ref 1.5–6.5)
NEUT%: 59.3 % (ref 38.4–76.8)
PLATELETS: 116 10*3/uL — AB (ref 145–400)
RBC: 3.5 10*6/uL — AB (ref 3.70–5.45)
RDW: 16.1 % — ABNORMAL HIGH (ref 11.2–14.5)
WBC: 3.7 10*3/uL — AB (ref 3.9–10.3)
lymph#: 0.7 10*3/uL — ABNORMAL LOW (ref 0.9–3.3)

## 2017-06-10 LAB — COMPREHENSIVE METABOLIC PANEL
ALBUMIN: 3.5 g/dL (ref 3.5–5.0)
ALK PHOS: 95 U/L (ref 40–150)
ALT: 38 U/L (ref 0–55)
AST: 58 U/L — AB (ref 5–34)
Anion Gap: 5 mEq/L (ref 3–11)
BUN: 12.6 mg/dL (ref 7.0–26.0)
CALCIUM: 10.2 mg/dL (ref 8.4–10.4)
CO2: 28 mEq/L (ref 22–29)
CREATININE: 1 mg/dL (ref 0.6–1.1)
Chloride: 102 mEq/L (ref 98–109)
EGFR: 54 mL/min/{1.73_m2} — ABNORMAL LOW (ref >60)
GLUCOSE: 109 mg/dL (ref 70–140)
Potassium: 5.3 mEq/L — ABNORMAL HIGH (ref 3.5–5.1)
Sodium: 135 mEq/L — ABNORMAL LOW (ref 136–145)
TOTAL PROTEIN: 6.8 g/dL (ref 6.4–8.3)
Total Bilirubin: 0.54 mg/dL (ref 0.20–1.20)

## 2017-06-10 MED ORDER — TEMOZOLOMIDE 140 MG PO CAPS: 150.0000 mg/m2/d | ORAL_CAPSULE | Freq: Every day | ORAL | 2 refills | Status: DC

## 2017-06-10 MED ORDER — CAPECITABINE 500 MG PO TABS: 750.0000 mg/m2 | ORAL_TABLET | Freq: Two times a day (BID) | ORAL | 2 refills | Status: DC

## 2017-06-10 NOTE — Telephone Encounter (Signed)
Gave avs and calendar for december °

## 2017-06-10 NOTE — Progress Notes (Signed)
[facility] Telephone:([phone]) [phone]   Fax:(336) [phone]  OFFICE PROGRESS NOTE  [name], [place] [address]  DIAGNOSIS:  1) Metastatic intermediate. Neuroendocrine tumor of lung primary diagnosed in January 2018 and presented with small bilateral pulmonary nodules in addition to multiple liver metastasis. 2) right lower lobe pulmonary embolism diagnosed incidentally on CT scan of the chest on 04/23/2017  PRIOR THERAPY:  1) Status post radio embolization with Y 90 to the liver lesions by interventional radiology. 2) status post IVC filter placement by interventional radiology on 04/24/2017  CURRENT THERAPY: Xeloda 750 MG/M2 twice a day days 1-14 and Temodar 150 MG/M2 days 10-14 every 4 weeks. Status post 9 cycles. She started cycle #10 on 05/24/2017.  INTERVAL HISTORY: [name] 83 y.o. female returns to the clinic today for follow-up visit accompanied by her husband and daughter.  The patient is feeling fine today with no specific complaints.  She continues to tolerate her treatment with Xeloda and Temodar fairly well.  She denied having any significant nausea or vomiting.  She denied having any weight loss or night sweats.  She has no fever or chills.  The patient denied having any significant chest pain, shortness of breath, cough or hemoptysis.  She is here today for evaluation and repeat blood work.  She is also currently on treatment with Xarelto for pulmonary embolism.  She has no bleeding issues.  MEDICAL HISTORY: Past Medical History:  Diagnosis Date  . Acute respiratory failure with hypoxia ([place]) 10/23/2015  . Allergic rhinitis    PT. DENIES  . Anxiety   . Arthritis    NECK  . Ataxia   . Bradycardia    primarily nocturnal  . Burning tongue syndrome 25 years  . Cancer ([place]) dx'd 06/2016   liver  . Cataract   . Cerebellar degeneration   . Chronic pericarditis   . Chronic urinary tract infection   . Complication of  anesthesia    low o2 sats, coded 30 years ago  . CVA (cerebral infarction) 05/2003  . Depression   . Encounter for antineoplastic chemotherapy 10/09/2016  . Gait disorder   . Gastric polyps   . GERD (gastroesophageal reflux disease)   . Goals of care, counseling/discussion 10/09/2016  . High cholesterol   . Hyperlipidemia   . Hypertension   . Hypertension   . Hypotension   . Hypothyroidism   . IBS (irritable bowel syndrome)   . Obesity   . Paroxysmal atrial fibrillation ([name])    chads2vasc score is 6,  she is felt to be a poor candidate for anticoagulation  . Personal history of arterial venous malformation (AVM)    right side of face  . Seizure disorder ([place])   . Seizures ([place]) 2003   " smelling"- Gabapentin "no problem"  . Shortness of breath dyspnea    with exertion  . Sternum fx 10/27/2013  . Stroke [facility]) 5 years ago   Right side of face weak, slurred speach-   . Thyroid disease   . TIA (transient ischemic attack)   . UTI (lower urinary tract infection) 03/27/2016   "frequently"    ALLERGIES:  is allergic to lisinopril.  MEDICATIONS:  Current Outpatient Medications  Medication Sig Dispense Refill  . acetaminophen (TYLENOL) 500 MG tablet Take 1,000 mg by mouth every 6 (six) hours as needed.     Marland Kitchen amitriptyline (ELAVIL) 25 MG tablet TAKE 1 TABLET AT BEDTIME 90 tablet  0  . capecitabine (XELODA) 500 MG tablet Take 3 tablets (1,500 mg total) by mouth 2 (two) times daily after a meal. 84 tablet 2  . escitalopram (LEXAPRO) 20 MG tablet Take 1 tablet (20 mg total) by mouth at bedtime. 90 tablet 1  . fluticasone (FLONASE) 50 MCG/ACT nasal spray Place 2 sprays into both nostrils at bedtime. 16 g 3  . gabapentin (NEURONTIN) 300 MG capsule TAKE 1 TO 2 CAPSULES FOUR TIMES A DAY 450 capsule 0  . hydroxypropyl methylcellulose / hypromellose (ISOPTO TEARS / GONIOVISC) 2.5 % ophthalmic solution Place 1 drop into both eyes at bedtime.    Marland Kitchen levothyroxine (SYNTHROID, LEVOTHROID) 75 MCG  tablet TAKE 1 TABLET DAILY BEFORE BREAKFAST 90 tablet 1  . losartan (COZAAR) 50 MG tablet TAKE 1 TABLET DAILY 90 tablet 1  . lovastatin (MEVACOR) 40 MG tablet TAKE 1 TABLET AT BEDTIME 90 tablet 0  . Melatonin 3 MG TBDP Take 3-6 mg by mouth at bedtime as needed. (Patient taking differently: Take 6 mg by mouth at bedtime as needed. ) 60 tablet 1  . omeprazole (PRILOSEC) 40 MG capsule TAKE 1 CAPSULE DAILY 90 capsule 0  . ondansetron (ZOFRAN) 8 MG tablet Take 1 tablet (8 mg total) by mouth every 8 (eight) hours as needed for nausea or vomiting. 60 tablet 0  . oxyCODONE-acetaminophen (PERCOCET) 5-325 MG tablet Take 1-2 tablets by mouth every 4 (four) hours as needed. 15 tablet 0  . Rivaroxaban 15 & 20 MG TBPK Take as directed on package: Start with one 15mg  tablet by mouth twice a day with food. On Day 22, switch to one 20mg  tablet once a day with food. 51 each 0  . temozolomide (TEMODAR) 140 MG capsule Take 2 capsules (280 mg total) by mouth daily. May take on an empty stomach or at bedtime to decrease nausea & vomiting. 10 capsule 2  . Vitamin D, Ergocalciferol, (DRISDOL) 50000 units CAPS capsule TAKE 1 CAPSULE EVERY 7 DAYS 12 capsule 0   No current facility-administered medications for this visit.     SURGICAL HISTORY:  Past Surgical History:  Procedure Laterality Date  . APPENDECTOMY  83 years old  . COLONOSCOPY  2006, 2009  . cyst removed  35 years ago  . EYE SURGERY Right    Cataract  . IR ANGIOGRAM SELECTIVE EACH ADDITIONAL VESSEL  11/28/2016  . IR ANGIOGRAM SELECTIVE EACH ADDITIONAL VESSEL  11/28/2016  . IR ANGIOGRAM SELECTIVE EACH ADDITIONAL VESSEL  11/28/2016  . IR ANGIOGRAM SELECTIVE EACH ADDITIONAL VESSEL  11/28/2016  . IR ANGIOGRAM SELECTIVE EACH ADDITIONAL VESSEL  12/13/2016  . IR ANGIOGRAM SELECTIVE EACH ADDITIONAL VESSEL  12/13/2016  . IR ANGIOGRAM SELECTIVE EACH ADDITIONAL VESSEL  01/09/2017  . IR ANGIOGRAM VISCERAL SELECTIVE  11/28/2016  . IR ANGIOGRAM VISCERAL SELECTIVE  11/28/2016  .  IR ANGIOGRAM VISCERAL SELECTIVE  12/13/2016  . IR ANGIOGRAM VISCERAL SELECTIVE  12/13/2016  . IR ANGIOGRAM VISCERAL SELECTIVE  01/09/2017  . IR EMBO ARTERIAL NOT HEMORR HEMANG INC GUIDE ROADMAPPING  11/28/2016  . IR EMBO TUMOR ORGAN ISCHEMIA INFARCT INC GUIDE ROADMAPPING  12/13/2016  . IR EMBO TUMOR ORGAN ISCHEMIA INFARCT INC GUIDE ROADMAPPING  01/09/2017  . IR IVC FILTER PLMT / S&I /IMG GUID/MOD SED  04/24/2017  . IR RADIOLOGIST EVAL & MGMT  11/06/2016  . IR RADIOLOGIST EVAL & MGMT  01/02/2017  . IR RADIOLOGIST EVAL & MGMT  02/05/2017  . IR RADIOLOGIST EVAL & MGMT  05/02/2017  . IR US GUIDE VASC  ACCESS RIGHT  11/28/2016  . IR US GUIDE VASC ACCESS RIGHT  12/13/2016  . IR US GUIDE VASC ACCESS RIGHT  01/09/2017  . KNEE ARTHROSCOPY Right 11/14/2006  . KNEE ARTHROSCOPY Bilateral 5 and 6 years ago  . tibial and fibular internal fixation Left   . TOTAL ABDOMINAL HYSTERECTOMY  83 years old  . UPPER GASTROINTESTINAL ENDOSCOPY  2009, 2013    REVIEW OF SYSTEMS:  A comprehensive review of systems was negative except for: Constitutional: positive for fatigue   PHYSICAL EXAMINATION: General appearance: alert, cooperative, fatigued and no distress Head: Normocephalic, without obvious abnormality, atraumatic Neck: no adenopathy, no JVD, supple, symmetrical, trachea midline and thyroid not enlarged, symmetric, no tenderness/mass/nodules Lymph nodes: Cervical, supraclavicular, and axillary nodes normal. Resp: clear to auscultation bilaterally Back: symmetric, no curvature. ROM normal. No CVA tenderness. Cardio: regular rate and rhythm, S1, S2 normal, no murmur, click, rub or gallop GI: soft, non-tender; bowel sounds normal; no masses,  no organomegaly Extremities: extremities normal, atraumatic, no cyanosis or edema  ECOG PERFORMANCE STATUS: 1 - Symptomatic but completely ambulatory  Blood pressure (!) 135/51, pulse 66, temperature 97.9 F (36.6 C), temperature source Oral, resp. rate 18, height 5\' 4"  (1.626 m),  weight 226 lb (102.5 kg), SpO2 96 %.  LABORATORY DATA: Lab Results  Component Value Date   WBC 3.7 (L) 06/10/2017   HGB 12.3 06/10/2017   HCT 37.8 06/10/2017   MCV 108.0 (H) 06/10/2017   PLT 116 (L) 06/10/2017      Chemistry      Component Value Date/Time   NA 135 (L) 06/10/2017 1317   K 5.3 (H) 06/10/2017 1317   CL 107 04/25/2017 0540   CO2 28 06/10/2017 1317   BUN 12.6 06/10/2017 1317   CREATININE 1.0 06/10/2017 1317      Component Value Date/Time   CALCIUM 10.2 06/10/2017 1317   ALKPHOS 95 06/10/2017 1317   AST 58 (H) 06/10/2017 1317   ALT 38 06/10/2017 1317   BILITOT 0.54 06/10/2017 1317       RADIOGRAPHIC STUDIES: No results found.  ASSESSMENT AND PLAN:  This is a white female with metastatic intermediate grade neuroendocrine carcinoma of questionable lung primary and multiple metastatic liver lesions and pancreatic lesions.She status post treatment with radio embolization with Y90 to the left and right lobe liver lesions.Status post treatment with Y 90 to the left and right lobes of the liver. She is currently undergoing systemic chemotherapy with Xeloda and Temodar status post 9 cycles. She started cycle #10 on May 24, 2017. The patient is feeling fine and tolerating her treatment well.  I recommended for her to continue her treatment as a schedule.  I will send a refill for Xeloda and Temodar to her pharmacy today. For the recent diagnosis of pulmonary embolism, she will continue on Xarelto 20 mg p.o. Daily. She was advised to call immediately if she has any concerning symptoms in the interval. The patient voices understanding of current disease status and treatment options and is in agreement with the current care plan. All questions were answered. The patient knows to call the clinic with any problems, questions or concerns. We can certainly see the patient much sooner if necessary.  Disclaimer: This note was dictated with voice recognition software. Similar  sounding words can inadvertently be transcribed and may not be corrected upon review.

## 2017-06-12 MED FILL — TEMOZOLOMIDE 140 MG CAPSULE: 140 | 5 days supply | Qty: 10 | Fill #0

## 2017-06-12 MED FILL — CAPECITABINE 500 MG TABLET: 500 | 14 days supply | Qty: 84 | Fill #0

## 2017-06-17 ENCOUNTER — Other Ambulatory Visit: Payer: Self-pay | Admitting: Medical Oncology

## 2017-06-17 ENCOUNTER — Telehealth: Payer: Self-pay | Admitting: *Deleted

## 2017-06-17 DIAGNOSIS — I2699 Other pulmonary embolism without acute cor pulmonale: Secondary | ICD-10-CM

## 2017-06-17 MED ORDER — RIVAROXABAN 20 MG PO TABS: 20.0000 mg | ORAL_TABLET | Freq: Every day | ORAL | 0 refills | Status: DC

## 2017-06-17 NOTE — Telephone Encounter (Signed)
Received [facility] alert for pause episode from 11/11 at 0938, duration ~5sec (transmitted via manual transmission on 11/18).  Presenting rhythm at time of manual transmission was sinus rhythm @ ~85bpm.    LMOM requesting call back to the [facility].  Gave direct number.  Will determine if patient was symptomatic with pause episode and remind her of upcoming appointment with Dr. Rayann Heman.

## 2017-06-17 NOTE — Telephone Encounter (Signed)
Follow up  ° ° °Patient is returning call.  °

## 2017-06-17 NOTE — Telephone Encounter (Signed)
Reviewed episode verbally with Dr. Rayann Heman.  He recommended that patient keep her appointment on 12/3, no changes necessary at this time.

## 2017-06-17 NOTE — Telephone Encounter (Signed)
Returned call to patient.  She reports she may have had a dizzy spell at the time of the episode--has these occasionally and lies down until the symptoms subside.  She correlates these episodes with slow heart rates ("sometimes in the 40s").  Patient does not drive.  Advised patient to keep her upcoming appointment with Dr. Rayann Heman on 12/3 and encouraged her to call our office or seek emergency medical attention in the interim for any new or worsening symptoms.  Advised patient that Dr. Rayann Heman will review episodes and we'll call her back with any additional recommendations.  Patient verbalizes understanding of all instructions and denies additional questions or concerns at this time.

## 2017-06-19 ENCOUNTER — Ambulatory Visit (INDEPENDENT_AMBULATORY_CARE_PROVIDER_SITE_OTHER): Payer: Medicare Other | Admitting: Family

## 2017-06-19 ENCOUNTER — Encounter: Payer: Self-pay | Admitting: Family

## 2017-06-19 VITALS — BP 133/73 | HR 80 | Temp 96.6°F | Ht 64.0 in

## 2017-06-19 DIAGNOSIS — N3001 Acute cystitis with hematuria: Secondary | ICD-10-CM

## 2017-06-19 DIAGNOSIS — R309 Painful micturition, unspecified: Secondary | ICD-10-CM | POA: Diagnosis not present

## 2017-06-19 LAB — URINALYSIS, COMPLETE
Bilirubin, UA: NEGATIVE
Ketones, UA: NEGATIVE
Nitrite, UA: POSITIVE — AB
PH UA: 6 (ref 5.0–7.5)
PROTEIN UA: NEGATIVE
RBC, UA: NEGATIVE
Specific Gravity, UA: <1.005 — ABNORMAL LOW (ref 1.005–1.030)
Urobilinogen, Ur: 1 mg/dL (ref 0.2–1.0)

## 2017-06-19 LAB — MICROSCOPIC EXAMINATION: RENAL EPITHEL UA: NONE SEEN /HPF

## 2017-06-19 MED ORDER — CIPROFLOXACIN HCL 500 MG PO TABS: 500.0000 mg | ORAL_TABLET | Freq: Two times a day (BID) | ORAL | 0 refills | Status: DC

## 2017-06-19 NOTE — Patient Instructions (Signed)

## 2017-06-19 NOTE — Progress Notes (Signed)
   Subjective:    Patient ID: [name], female    DOB: 27-Apr-1934, 83 y.o.   MRN: [id number]  Dysuria   The current episode started 1 to 4 weeks ago. The problem occurs every urination. The problem has been gradually worsening. The quality of the pain is described as burning (pressure). The pain is at a severity of 9/10. The pain is moderate. Associated symptoms include frequency, nausea and urgency. Pertinent negatives include no discharge, flank pain, hematuria, hesitancy or vomiting. She has tried antibiotics for the symptoms. The treatment provided mild relief.      Review of Systems  Gastrointestinal: Positive for nausea. Negative for vomiting.  Genitourinary: Positive for dysuria, frequency and urgency. Negative for flank pain, hematuria and hesitancy.  All other systems reviewed and are negative.      Objective:   Physical Exam  Constitutional: She is oriented to person, place, and time. She appears well-developed and well-nourished. No distress.  HENT:  Head: Normocephalic.  Eyes: Pupils are equal, round, and reactive to light.  Cardiovascular: Normal rate, regular rhythm, normal heart sounds and intact distal pulses.  No murmur heard. Pulmonary/Chest: Effort normal and breath sounds normal. No respiratory distress. She has no wheezes.  Abdominal: Soft. Bowel sounds are normal. She exhibits no distension. There is no tenderness.  Musculoskeletal: Normal range of motion. She exhibits edema (trace in BLE). She exhibits no tenderness.  Generalized weakness, negative for CVA tenderness  Neurological: She is alert and oriented to person, place, and time. She has normal reflexes.  Skin: Skin is warm and dry.  Jaundice   Psychiatric: She has a normal mood and affect. Her behavior is normal. Judgment and thought content normal.  Vitals reviewed.     BP 133/73   Pulse 80   Temp (!) 96.6 F (35.9 C) (Oral)   Ht 5\' 4"  (1.626 m)   BMI 38.79 kg/m      Assessment &  Plan:  1. Pain passing urine - Urinalysis, Complete  2. Acute cystitis with hematuria Force fluids AZO over the counter X2 days RTO prn Culture pending - ciprofloxacin (CIPRO) 500 MG tablet; Take 1 tablet (500 mg total) by mouth 2 (two) times daily.  Dispense: 14 tablet; Refill: 0 - Urine Culture   Evelina Dun, FNP

## 2017-06-21 LAB — URINE CULTURE

## 2017-06-23 ENCOUNTER — Other Ambulatory Visit: Payer: Self-pay | Admitting: Pediatrics

## 2017-06-23 DIAGNOSIS — E038 Other specified hypothyroidism: Secondary | ICD-10-CM

## 2017-06-24 ENCOUNTER — Ambulatory Visit (INDEPENDENT_AMBULATORY_CARE_PROVIDER_SITE_OTHER): Payer: Medicare Other | Admitting: *Deleted

## 2017-06-24 DIAGNOSIS — R002 Palpitations: Secondary | ICD-10-CM | POA: Diagnosis not present

## 2017-06-25 NOTE — Progress Notes (Signed)
Carelink Summary Report / Loop Recorder 

## 2017-06-26 ENCOUNTER — Encounter: Payer: Self-pay | Admitting: Neurology

## 2017-06-26 ENCOUNTER — Other Ambulatory Visit: Payer: Self-pay | Admitting: Pediatrics

## 2017-06-26 ENCOUNTER — Ambulatory Visit (INDEPENDENT_AMBULATORY_CARE_PROVIDER_SITE_OTHER): Payer: Medicare Other | Admitting: Neurology

## 2017-06-26 ENCOUNTER — Other Ambulatory Visit: Payer: Medicare Other

## 2017-06-26 VITALS — BP 118/60 | HR 74 | Ht 64.0 in | Wt 228.6 lb

## 2017-06-26 DIAGNOSIS — K146 Glossodynia: Secondary | ICD-10-CM

## 2017-06-26 DIAGNOSIS — R519 Headache, unspecified: Secondary | ICD-10-CM

## 2017-06-26 DIAGNOSIS — R51 Headache: Secondary | ICD-10-CM | POA: Diagnosis not present

## 2017-06-26 DIAGNOSIS — G118 Other hereditary ataxias: Secondary | ICD-10-CM

## 2017-06-26 DIAGNOSIS — E039 Hypothyroidism, unspecified: Secondary | ICD-10-CM

## 2017-06-26 LAB — SEDIMENTATION RATE: SED RATE: 34 mm/h — AB (ref 0–30)

## 2017-06-26 NOTE — Telephone Encounter (Signed)
Needs to have her thyroid rechecked. I put in future order, come by here for blood draw when able. Cancer center may also be able to see the order, may be able to have it drawn then.

## 2017-06-26 NOTE — Progress Notes (Signed)
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   UPDATE: She is in wheelchair Her left sided headache persists.  She reports some vision problems that are chronic such as blurred or double vision.  She denies fever or joint pain.     HISTORY: For several years, she has experienced gradual progression of balance problems.  She is now extremely unsteady on her feet and cannot ambulate without assistance.  She notes some associated dizziness with movement.  She also reports that her handwriting has gotten worse.  She has history of some swallowing issues when she eats and has had endoscopy with dilatation in the past.  She does have stress incontinence.  She has both horizontal and vertical diplopia, which was previously briefly treated with prisms.  PT has not helped with gait or balance in the past.   To evaluate diplopia, she had an MRI of the brain and orbits with and without contrast on 06/07/15, which showed no acute intracranial abnormalities.  It did reveal small vessel ischemic changes, global atrophy, particularly of the cerebellum, and area of signal abnormality within the corpus callosum, all known previously known entities.  Recent myasthenia gravis panel and thyrotropin receptor antibody was negative.  TSH and Free T4 from April were normal.   She has a sister with cerebellar degeneration, diagnosed 15 years ago, and has gradually become disabled.  She has another sister with multiple sclerosis.   She takes gabapentin for "smelling seizures" which were diagnosed 15 years ago.   She reports history of an AVM on the right side of her face, diagnosed many years ago  and therefore cannot use blood thinners.  However, prior MRA of the head and neck did not reveal any AVM.   She also reports pain from the right jaw radiating up to the right temple for the past several months.  It lasts a couple of seconds and occurs 2 to 3 times a week.  She also has left sided periorbital and temple pain.  Sed rate from August 2017 was 8.  PAST MEDICAL HISTORY: Past Medical History:  Diagnosis Date  . Acute respiratory failure with hypoxia ([place]) 10/23/2015  . Allergic rhinitis    PT. DENIES  . Anxiety   . Arthritis    NECK  . Ataxia   . Bradycardia    primarily nocturnal  . Burning tongue syndrome 25 years  . Cancer ([place]) dx'd 06/2016   liver  . Cataract   . Cerebellar degeneration   . Chronic pericarditis   . Chronic urinary tract infection   . Complication of anesthesia    low o2 sats, coded 30 years ago  . CVA (cerebral infarction) 05/2003  . Depression   . Encounter for antineoplastic chemotherapy 10/09/2016  . Gait disorder   . Gastric polyps   . GERD (gastroesophageal reflux disease)   . Goals of care, counseling/discussion 10/09/2016  . High cholesterol   . Hyperlipidemia   . Hypertension   . Hypertension   . Hypotension   . Hypothyroidism   . IBS (irritable bowel syndrome)   . Obesity   . Paroxysmal atrial fibrillation ([name])    chads2vasc score is 6,  she is felt to be a poor candidate for anticoagulation  .  Personal history of arterial venous malformation (AVM)    right side of face  . Seizure disorder ([place])   . Seizures ([place]) 2003   " smelling"- Gabapentin "no problem"  . Shortness of breath dyspnea    with exertion  . Sternum fx 10/27/2013  . Stroke [facility]) 5 years ago   Right side of face weak, slurred speach-   . Thyroid disease   . TIA (transient ischemic attack)   . UTI (lower urinary tract infection) 03/27/2016   "frequently"    MEDICATIONS: Current Outpatient Medications on File Prior to Visit  Medication Sig Dispense Refill    . acetaminophen (TYLENOL) 500 MG tablet Take 1,000 mg by mouth every 6 (six) hours as needed.     . capecitabine (XELODA) 500 MG tablet Take 3 tablets (1,500 mg total) 2 (two) times daily after a meal by mouth. 84 tablet 2  . ciprofloxacin (CIPRO) 500 MG tablet Take 1 tablet (500 mg total) by mouth 2 (two) times daily. 14 tablet 0  . escitalopram (LEXAPRO) 20 MG tablet Take 1 tablet (20 mg total) by mouth at bedtime. 90 tablet 1  . fluticasone (FLONASE) 50 MCG/ACT nasal spray Place 2 sprays into both nostrils at bedtime. 16 g 3  . gabapentin (NEURONTIN) 300 MG capsule TAKE 1 TO 2 CAPSULES FOUR TIMES A DAY 450 capsule 0  . hydroxypropyl methylcellulose / hypromellose (ISOPTO TEARS / GONIOVISC) 2.5 % ophthalmic solution Place 1 drop into both eyes at bedtime.    Marland Kitchen levothyroxine (SYNTHROID, LEVOTHROID) 75 MCG tablet TAKE 1 TABLET DAILY BEFORE BREAKFAST 90 tablet 0  . losartan (COZAAR) 50 MG tablet TAKE 1 TABLET DAILY 90 tablet 1  . lovastatin (MEVACOR) 40 MG tablet TAKE 1 TABLET AT BEDTIME 90 tablet 0  . Melatonin 3 MG TBDP Take 3-6 mg by mouth at bedtime as needed. (Patient taking differently: Take 6 mg by mouth at bedtime as needed. ) 60 tablet 1  . omeprazole (PRILOSEC) 40 MG capsule TAKE 1 CAPSULE DAILY 90 capsule 0  . ondansetron (ZOFRAN) 8 MG tablet Take 1 tablet (8 mg total) by mouth every 8 (eight) hours as needed for nausea or vomiting. 60 tablet 0  . oxyCODONE-acetaminophen (PERCOCET) 5-325 MG tablet Take 1-2 tablets by mouth every 4 (four) hours as needed. 15 tablet 0  . rivaroxaban (XARELTO) 20 MG TABS tablet Take 1 tablet (20 mg total) daily with supper by mouth. 90 tablet 0  . temozolomide (TEMODAR) 140 MG capsule TAKE 2 CAPSULES BY MOUTH DAILY. MAY TAKE ON AN EMPTY STOMACH OR AT BEDTIME TO DECREASE NAUSEA AND VOMITING 10 capsule 2  . Vitamin D, Ergocalciferol, (DRISDOL) 50000 units CAPS capsule TAKE 1 CAPSULE EVERY 7 DAYS 12 capsule 0   No current facility-administered medications on  file prior to visit.     ALLERGIES: Allergies  Allergen Reactions  . Lisinopril Cough    FAMILY HISTORY: Family History  Problem Relation Age of Onset  . Heart attack Father 21       fatal  . Coronary artery disease Brother   . Diabetes Brother   . Prostate cancer Brother   . Prostate cancer Son     SOCIAL HISTORY: Social History   Socioeconomic History  . Marital status: Married    Spouse name: Not on file  . Number of children: Not on file  . Years of education: Not on file  . Highest education level: Not on file  Social Needs  . Financial resource strain: Not  on file  . Food insecurity - worry: Not on file  . Food insecurity - inability: Not on file  . Transportation needs - medical: Not on file  . Transportation needs - non-medical: Not on file  Occupational History  . Not on file  Tobacco Use  . Smoking status: Former Smoker    Packs/day: 0.25    Years: 10.00    Pack years: 2.50    Types: Cigarettes    Last attempt to quit: 07/31/1975    Years since quitting: 41.9  . Smokeless tobacco: Never Used  Substance and Sexual Activity  . Alcohol use: No    Comment: Rare- maybe a drink ever 2 years  . Drug use: No  . Sexual activity: Not on file  Other Topics Concern  . Not on file  Social History Narrative   Lives with husband, does have stairs, does not use them. Pt completed 10th grade.    REVIEW OF SYSTEMS: Constitutional: No fevers, chills, or sweats, no generalized fatigue, change in appetite Eyes: No visual changes, double vision, eye pain Ear, nose and throat: No hearing loss, ear pain, nasal congestion, sore throat Cardiovascular: No chest pain, palpitations Respiratory:  No shortness of breath at rest or with exertion, wheezes GastrointestinaI: No nausea, vomiting, diarrhea, abdominal pain, fecal incontinence Genitourinary:  No dysuria, urinary retention or frequency Musculoskeletal:  No neck pain, back pain Integumentary: No rash, pruritus, skin  lesions Neurological: as above Psychiatric: No depression, insomnia, anxiety Endocrine: No palpitations, fatigue, diaphoresis, mood swings, change in appetite, change in weight, increased thirst Hematologic/Lymphatic:  No purpura, petechiae. Allergic/Immunologic: no itchy/runny eyes, nasal congestion, recent allergic reactions, rashes  PHYSICAL EXAM: Vitals:   06/26/17 1430  BP: 118/60  Pulse: 74  SpO2: 98%   General: No acute distress.  Patient appears well-groomed.  Head:  Normocephalic/atraumatic Eyes:  Fundi examined but not visualized Neck: supple, no paraspinal tenderness, full range of motion Heart:  Regular rate and rhythm Lungs:  Clear to auscultation bilaterally Back: No paraspinal tenderness Neurological Exam: alert and oriented to person, place, and time. Attention span and concentration intact, recent and remote memory intact, fund of knowledge intact.  Speech fluent and not dysarthric, language intact.  Saccadic eye movements when tracking in all directions, nystagmus.  Otherwise CN II-XII intact. Bulk and tone normal, muscle strength 5/5 throughout.  Sensation to light touch, temperature and vibration intact.  Deep tendon reflexes 3+ throughout, toes downgoing.  Finger to nose intact but heel to shin testing with dysmetria.  Unable to ambulate.  IMPRESSION: 1. 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  Explained that this is progressive.  Unfortunately, definitive testing requires genetic testing, which is extremely costly.  We can refer her to an academic center, however it would likely not change management 2. Left sided periorbital and temporal pain.   3.  Temporal epilepsy/simple partial seizures ("smelling seizures"), stable  PLAN: 1.  Check sed rate 2.  Continue gabapentin  3.  Follow up in 1 year  25 minutes spent face to face with patient, over 50% spent discussing management.  Metta Clines, DO  CC:  Assunta Found, MD

## 2017-06-26 NOTE — Patient Instructions (Signed)
1.  We will check sed rate to evaluate headache 2.  Continue gabapentin 3.  Follow up in one year

## 2017-06-27 ENCOUNTER — Telehealth: Payer: Self-pay

## 2017-06-27 NOTE — Telephone Encounter (Signed)
-----   Message from Pieter Partridge, DO sent at 06/27/2017  9:57 AM EST ----- Lab looks okay

## 2017-06-27 NOTE — Telephone Encounter (Signed)
Called and spoke with Pt, advsd her the labs are okay

## 2017-06-27 NOTE — Telephone Encounter (Signed)
Husband aware and verbalizes understanding.

## 2017-07-01 ENCOUNTER — Other Ambulatory Visit: Payer: Self-pay | Admitting: Internal Medicine

## 2017-07-01 ENCOUNTER — Encounter: Payer: Self-pay | Admitting: Internal Medicine

## 2017-07-01 ENCOUNTER — Ambulatory Visit (INDEPENDENT_AMBULATORY_CARE_PROVIDER_SITE_OTHER): Payer: Medicare Other | Admitting: Internal Medicine

## 2017-07-01 VITALS — BP 108/56 | HR 72 | Ht 64.0 in | Wt 228.2 lb

## 2017-07-01 DIAGNOSIS — R001 Bradycardia, unspecified: Secondary | ICD-10-CM | POA: Diagnosis not present

## 2017-07-01 DIAGNOSIS — I480 Paroxysmal atrial fibrillation: Secondary | ICD-10-CM | POA: Diagnosis not present

## 2017-07-01 DIAGNOSIS — I10 Essential (primary) hypertension: Secondary | ICD-10-CM | POA: Diagnosis not present

## 2017-07-01 NOTE — Progress Notes (Signed)
PCP: Eustaquio Maize, MD Primary Cardiologist: Dr Gwenlyn Found Primary EP: Dr Rayann Heman  [name] is a 83 y.o. female who presents today for routine electrophysiology followup.  Since last being seen in our clinic, the patient reports doing reasonably well.  She has been diagnosed with neuroendocrine tumor of the lung primary stage IV with liver mets.  She is s/p chemo and xrt.  She also has had PTEs for which she is on xarelto.   Today, she denies symptoms of palpitations, chest pain, shortness of breath,  lower extremity edema, dizziness, or syncope.  She did have presyncope 06/09/17 and was found to have a 5 second sinus pause on her ILR.  This is her only episode since device implant 12/2014.  The patient is otherwise without complaint today.   Past Medical History:  Diagnosis Date  . Acute respiratory failure with hypoxia ([place]) 10/23/2015  . Allergic rhinitis    PT. DENIES  . Anxiety   . Arthritis    NECK  . Ataxia   . Bradycardia    primarily nocturnal  . Burning tongue syndrome 25 years  . Cancer ([place]) dx'd 06/2016   liver  . Cataract   . Cerebellar degeneration   . Chronic pericarditis   . Chronic urinary tract infection   . Complication of anesthesia    low o2 sats, coded 30 years ago  . CVA (cerebral infarction) 05/2003  . Depression   . Encounter for antineoplastic chemotherapy 10/09/2016  . Gait disorder   . Gastric polyps   . GERD (gastroesophageal reflux disease)   . Goals of care, counseling/discussion 10/09/2016  . High cholesterol   . Hyperlipidemia   . Hypertension   . Hypertension   . Hypotension   . Hypothyroidism   . IBS (irritable bowel syndrome)   . Obesity   . Paroxysmal atrial fibrillation ([name])    chads2vasc score is 6,  she is felt to be a poor candidate for anticoagulation  . Personal history of arterial venous malformation (AVM)    right side of face  . Seizure disorder ([place])   . Seizures ([place]) 2003   " smelling"- Gabapentin "no problem"  .  Shortness of breath dyspnea    with exertion  . Sternum fx 10/27/2013  . Stroke [facility]) 5 years ago   Right side of face weak, slurred speach-   . Thyroid disease   . TIA (transient ischemic attack)   . UTI (lower urinary tract infection) 03/27/2016   "frequently"   Past Surgical History:  Procedure Laterality Date  . APPENDECTOMY  83 years old  . COLONOSCOPY  2006, 2009  . COLONOSCOPY WITH PROPOFOL N/A 03/28/2016   Procedure: COLONOSCOPY WITH PROPOFOL;  Surgeon: Gatha Mayer, MD;  Location: [facility];  Service: Endoscopy;  Laterality: N/A;  . cyst removed  35 years ago  . EP IMPLANTABLE DEVICE N/A 01/06/2015   Procedure: Loop Recorder Insertion;  Surgeon: Thompson Grayer, MD;  Location: [place] [facility];  Service: Cardiovascular;  Laterality: N/A;  . EYE SURGERY Right    Cataract  . IR ANGIOGRAM SELECTIVE EACH ADDITIONAL VESSEL  11/28/2016  . IR ANGIOGRAM SELECTIVE EACH ADDITIONAL VESSEL  11/28/2016  . IR ANGIOGRAM SELECTIVE EACH ADDITIONAL VESSEL  11/28/2016  . IR ANGIOGRAM SELECTIVE EACH ADDITIONAL VESSEL  11/28/2016  . IR ANGIOGRAM SELECTIVE EACH ADDITIONAL VESSEL  12/13/2016  . IR ANGIOGRAM SELECTIVE EACH ADDITIONAL VESSEL  12/13/2016  . IR ANGIOGRAM SELECTIVE EACH ADDITIONAL VESSEL  01/09/2017  .  IR ANGIOGRAM VISCERAL SELECTIVE  11/28/2016  . IR ANGIOGRAM VISCERAL SELECTIVE  11/28/2016  . IR ANGIOGRAM VISCERAL SELECTIVE  12/13/2016  . IR ANGIOGRAM VISCERAL SELECTIVE  12/13/2016  . IR ANGIOGRAM VISCERAL SELECTIVE  01/09/2017  . IR EMBO ARTERIAL NOT HEMORR HEMANG INC GUIDE ROADMAPPING  11/28/2016  . IR EMBO TUMOR ORGAN ISCHEMIA INFARCT INC GUIDE ROADMAPPING  12/13/2016  . IR EMBO TUMOR ORGAN ISCHEMIA INFARCT INC GUIDE ROADMAPPING  01/09/2017  . IR IVC FILTER PLMT / S&I /IMG GUID/MOD SED  04/24/2017  . IR RADIOLOGIST EVAL & MGMT  11/06/2016  . IR RADIOLOGIST EVAL & MGMT  01/02/2017  . IR RADIOLOGIST EVAL & MGMT  02/05/2017  . IR RADIOLOGIST EVAL & MGMT  05/02/2017  . IR US GUIDE VASC ACCESS RIGHT   11/28/2016  . IR US GUIDE VASC ACCESS RIGHT  12/13/2016  . IR US GUIDE VASC ACCESS RIGHT  01/09/2017  . KNEE ARTHROSCOPY Right 11/14/2006  . KNEE ARTHROSCOPY Bilateral 5 and 6 years ago  . KNEE ARTHROSCOPY WITH LATERAL MENISECTOMY  07/03/2012   Procedure: KNEE ARTHROSCOPY WITH LATERAL MENISECTOMY;  Surgeon: Magnus Sinning, MD;  Location: [facility];  Service: Orthopedics;  Laterality: Left;  with Partial Lateral Menisectomy and Medial Menisectomy. Shaving of medial and lateral femoral condyles. Shaving of patella. Removal of a loose body  . tibial and fibular internal fixation Left   . TOTAL ABDOMINAL HYSTERECTOMY  83 years old  . UPPER GASTROINTESTINAL ENDOSCOPY  2009, 2013    ROS- all systems are reviewed and negatives except as per HPI above  Current Outpatient Medications  Medication Sig Dispense Refill  . acetaminophen (TYLENOL) 500 MG tablet Take 1,000 mg by mouth every 6 (six) hours as needed for mild pain or headache.     Marland Kitchen amitriptyline (ELAVIL) 25 MG tablet Take 25 mg by mouth at bedtime.    . capecitabine (XELODA) 500 MG tablet Take 1,500 mg by mouth 2 (two) times daily after a meal.    . escitalopram (LEXAPRO) 20 MG tablet Take 1 tablet (20 mg total) by mouth at bedtime. 90 tablet 1  . fluticasone (FLONASE) 50 MCG/ACT nasal spray Place 2 sprays into both nostrils at bedtime. 16 g 3  . gabapentin (NEURONTIN) 400 MG capsule Take 400-800 mg by mouth 4 (four) times daily.    . hydroxypropyl methylcellulose / hypromellose (ISOPTO TEARS / GONIOVISC) 2.5 % ophthalmic solution Place 1 drop into both eyes at bedtime.    Marland Kitchen levothyroxine (SYNTHROID, LEVOTHROID) 75 MCG tablet TAKE 1 TABLET DAILY BEFORE BREAKFAST 90 tablet 0  . losartan (COZAAR) 50 MG tablet TAKE 1 TABLET DAILY 90 tablet 1  . lovastatin (MEVACOR) 40 MG tablet TAKE 1 TABLET AT BEDTIME 90 tablet 0  . Melatonin 3 MG TBDP Take 3-6 mg by mouth at bedtime as needed. 60 tablet 1  . omeprazole (PRILOSEC) 40 MG capsule TAKE 1 CAPSULE DAILY  90 capsule 0  . ondansetron (ZOFRAN) 8 MG tablet Take 1 tablet (8 mg total) by mouth every 8 (eight) hours as needed for nausea or vomiting. 60 tablet 0  . oxyCODONE-acetaminophen (PERCOCET) 5-325 MG tablet Take 1-2 tablets by mouth every 4 (four) hours as needed. 15 tablet 0  . rivaroxaban (XARELTO) 20 MG TABS tablet Take 1 tablet (20 mg total) daily with supper by mouth. 90 tablet 0  . temozolomide (TEMODAR) 140 MG capsule TAKE 2 CAPSULES BY MOUTH DAILY. MAY TAKE ON AN EMPTY STOMACH OR AT BEDTIME TO DECREASE NAUSEA AND VOMITING  10 capsule 2  . Vitamin D, Ergocalciferol, (DRISDOL) 50000 units CAPS capsule TAKE 1 CAPSULE EVERY 7 DAYS 12 capsule 0   No current facility-administered medications for this visit.     Physical Exam: Vitals:   07/01/17 1551  BP: (!) 108/56  Pulse: 72  SpO2: 94%  Weight: 228 lb 3.2 oz (103.5 kg)  Height: 5\' 4"  (1.626 m)    GEN- The patient is chronically ill appearing, alert and oriented x 3 today.   Head- normocephalic, atraumatic Eyes-  Sclera clear, conjunctiva pink Ears- hearing intact Oropharynx- clear Lungs- Clear to ausculation bilaterally, normal work of breathing Heart- Regular rate and rhythm, no murmurs, rubs or gallops, PMI not laterally displaced GI- soft, NT, ND, + BS Extremities- no clubbing, cyanosis, or edema  ILR interrogation today is personally reviewed and reveals sinus pause of 5 seconds on 06/09/17.  No other pauses,  Low AF burden is noted  Assessment and Plan:  1. Sinus pause + presyncope We discussed PPM implant today.  Given her recent cancer with ongoing treatment and infrequent pauses, she is clear that she would like to avoid PPM at this time.  We will continue to monitor with ILR.  If her pauses become more frequent then we may consider PPM implant at that time.  2. Paroxysmal atrial fibrillation AF burden remains low.  On xarelto for prior PTE  3. HTN Stable No change required today  4. Lung CA Followed by Dr  Tiburcio Pea Return to see me in a year  Thompson Grayer MD, [facility] 07/01/2017 4:21 PM

## 2017-07-01 NOTE — Patient Instructions (Addendum)
Medication Instructions:  Your physician recommends that you continue on your current medications as directed. Please refer to the Current Medication list given to you today.   Labwork: None ordered   Testing/Procedures: None ordered   Follow-Up: Your physician wants you to follow-up in: 12 months with Dr Rayann Heman Dennis Bast will receive a reminder letter in the mail two months in advance. If you don't receive a letter, please call our office to schedule the follow-up appointment.  Remote monitoring is used to monitor your pacemaker from home. This monitoring reduces the number of office visits required to check your device to one time per year. It allows [place] to keep an eye on the functioning of your device to ensure it is working properly. You are scheduled for a device check from home on 07/24/17. You may send your transmission at any time that day. If you have a wireless device, the transmission will be sent automatically. After your physician reviews your transmission, you will receive a postcard with your next transmission date.     Any Other Special Instructions Will Be Listed Below (If Applicable).     If you need a refill on your cardiac medications before your next appointment, please call your pharmacy.

## 2017-07-02 LAB — CUP PACEART INCLINIC DEVICE CHECK
Date Time Interrogation Session: 20181203205909
MDC IDC PG IMPLANT DT: 20160609

## 2017-07-07 MED FILL — TEMOZOLOMIDE 140 MG CAPSULE: 140 | 5 days supply | Qty: 10 | Fill #1

## 2017-07-07 MED FILL — CAPECITABINE 500 MG TABLET: 500 | 14 days supply | Qty: 84 | Fill #1

## 2017-07-09 LAB — CUP PACEART REMOTE DEVICE CHECK
Date Time Interrogation Session: 20181126223909
Implantable Pulse Generator Implant Date: 20160609

## 2017-07-10 ENCOUNTER — Other Ambulatory Visit (HOSPITAL_BASED_OUTPATIENT_CLINIC_OR_DEPARTMENT_OTHER): Payer: Medicare Other

## 2017-07-10 ENCOUNTER — Ambulatory Visit (HOSPITAL_COMMUNITY)
Admission: RE | Admit: 2017-07-10 | Discharge: 2017-07-10 | Disposition: A | Discharge status: Home / Self Care | Payer: Medicare Other | Source: Ambulatory Visit | Attending: Internal Medicine | Admitting: Internal Medicine

## 2017-07-10 ENCOUNTER — Other Ambulatory Visit: Payer: Self-pay | Admitting: Pediatrics

## 2017-07-10 ENCOUNTER — Encounter (HOSPITAL_COMMUNITY): Payer: Self-pay

## 2017-07-10 DIAGNOSIS — E039 Hypothyroidism, unspecified: Secondary | ICD-10-CM | POA: Diagnosis not present

## 2017-07-10 DIAGNOSIS — I77810 Thoracic aortic ectasia: Secondary | ICD-10-CM | POA: Diagnosis not present

## 2017-07-10 DIAGNOSIS — C787 Secondary malignant neoplasm of liver and intrahepatic bile duct: Secondary | ICD-10-CM | POA: Diagnosis not present

## 2017-07-10 DIAGNOSIS — C7A8 Other malignant neuroendocrine tumors: Secondary | ICD-10-CM | POA: Diagnosis present

## 2017-07-10 DIAGNOSIS — I2510 Atherosclerotic heart disease of native coronary artery without angina pectoris: Secondary | ICD-10-CM | POA: Diagnosis not present

## 2017-07-10 DIAGNOSIS — I700 Atherosclerosis of aorta: Secondary | ICD-10-CM | POA: Insufficient documentation

## 2017-07-10 DIAGNOSIS — R918 Other nonspecific abnormal finding of lung field: Secondary | ICD-10-CM | POA: Insufficient documentation

## 2017-07-10 DIAGNOSIS — C7A1 Malignant poorly differentiated neuroendocrine tumors: Secondary | ICD-10-CM | POA: Diagnosis not present

## 2017-07-10 DIAGNOSIS — C7B8 Other secondary neuroendocrine tumors: Secondary | ICD-10-CM

## 2017-07-10 DIAGNOSIS — N200 Calculus of kidney: Secondary | ICD-10-CM | POA: Diagnosis not present

## 2017-07-10 DIAGNOSIS — I2699 Other pulmonary embolism without acute cor pulmonale: Secondary | ICD-10-CM | POA: Diagnosis not present

## 2017-07-10 LAB — COMPREHENSIVE METABOLIC PANEL
ALT: 28 U/L (ref 0–55)
ANION GAP: 10 meq/L (ref 3–11)
AST: 43 U/L — ABNORMAL HIGH (ref 5–34)
Albumin: 3.6 g/dL (ref 3.5–5.0)
Alkaline Phosphatase: 93 U/L (ref 40–150)
BUN: 11.4 mg/dL (ref 7.0–26.0)
CHLORIDE: 100 meq/L (ref 98–109)
CO2: 25 meq/L (ref 22–29)
Calcium: 9.9 mg/dL (ref 8.4–10.4)
Creatinine: 1 mg/dL (ref 0.6–1.1)
EGFR: 56 mL/min/{1.73_m2} — AB (ref >60)
Glucose: 117 mg/dl (ref 70–140)
POTASSIUM: 4.6 meq/L (ref 3.5–5.1)
Sodium: 135 mEq/L — ABNORMAL LOW (ref 136–145)
Total Bilirubin: 0.56 mg/dL (ref 0.20–1.20)
Total Protein: 6.9 g/dL (ref 6.4–8.3)

## 2017-07-10 LAB — CBC WITH DIFFERENTIAL/PLATELET
BASO%: 1 % (ref 0.0–2.0)
BASOS ABS: 0 10*3/uL (ref 0.0–0.1)
EOS ABS: 0.2 10*3/uL (ref 0.0–0.5)
EOS%: 5.7 % (ref 0.0–7.0)
HEMATOCRIT: 37.4 % (ref 34.8–46.6)
HEMOGLOBIN: 12.4 g/dL (ref 11.6–15.9)
LYMPH#: 0.7 10*3/uL — AB (ref 0.9–3.3)
LYMPH%: 23.3 % (ref 14.0–49.7)
MCH: 35.4 pg — AB (ref 25.1–34.0)
MCHC: 33.2 g/dL (ref 31.5–36.0)
MCV: 106.7 fL — AB (ref 79.5–101.0)
MONO#: 0.4 10*3/uL (ref 0.1–0.9)
MONO%: 13.1 % (ref 0.0–14.0)
NEUT#: 1.7 10*3/uL (ref 1.5–6.5)
NEUT%: 56.9 % (ref 38.4–76.8)
Platelets: 146 10*3/uL (ref 145–400)
RBC: 3.5 10*6/uL — ABNORMAL LOW (ref 3.70–5.45)
RDW: 17.1 % — AB (ref 11.2–14.5)
WBC: 3.1 10*3/uL — ABNORMAL LOW (ref 3.9–10.3)

## 2017-07-10 MED ORDER — IOPAMIDOL (ISOVUE-300) INJECTION 61%
100.0000 mL | Freq: Once | INTRAVENOUS | Status: AC | PRN
  Administered 2017-07-10: 100 mL via INTRAVENOUS

## 2017-07-10 MED ORDER — IOPAMIDOL (ISOVUE-300) INJECTION 61%
INTRAVENOUS | Status: AC
  Filled 2017-07-10: qty 100

## 2017-07-13 ENCOUNTER — Other Ambulatory Visit: Payer: Self-pay | Admitting: Pediatrics

## 2017-07-13 DIAGNOSIS — I10 Essential (primary) hypertension: Secondary | ICD-10-CM

## 2017-07-15 ENCOUNTER — Encounter: Payer: Self-pay | Admitting: Internal Medicine

## 2017-07-15 ENCOUNTER — Ambulatory Visit (HOSPITAL_BASED_OUTPATIENT_CLINIC_OR_DEPARTMENT_OTHER): Payer: Medicare Other | Admitting: Internal Medicine

## 2017-07-15 ENCOUNTER — Telehealth: Payer: Self-pay | Admitting: Internal Medicine

## 2017-07-15 ENCOUNTER — Other Ambulatory Visit: Payer: Self-pay | Admitting: Pediatrics

## 2017-07-15 VITALS — BP 118/41 | HR 79 | Temp 97.5°F | Resp 20 | Wt 226.4 lb

## 2017-07-15 DIAGNOSIS — F329 Major depressive disorder, single episode, unspecified: Secondary | ICD-10-CM

## 2017-07-15 DIAGNOSIS — I2699 Other pulmonary embolism without acute cor pulmonale: Secondary | ICD-10-CM | POA: Diagnosis not present

## 2017-07-15 DIAGNOSIS — C7A8 Other malignant neuroendocrine tumors: Secondary | ICD-10-CM | POA: Diagnosis not present

## 2017-07-15 DIAGNOSIS — C7B8 Other secondary neuroendocrine tumors: Secondary | ICD-10-CM

## 2017-07-15 DIAGNOSIS — F411 Generalized anxiety disorder: Secondary | ICD-10-CM

## 2017-07-15 DIAGNOSIS — F32A Depression, unspecified: Secondary | ICD-10-CM

## 2017-07-15 DIAGNOSIS — Z7901 Long term (current) use of anticoagulants: Secondary | ICD-10-CM | POA: Diagnosis not present

## 2017-07-15 LAB — TSH: TSH: 3.46 u[IU]/mL (ref 0.450–4.500)

## 2017-07-15 NOTE — Telephone Encounter (Signed)
Scheduled app tper 12/17 los- Gave patient AVS and calender per los.

## 2017-07-15 NOTE — Progress Notes (Signed)
[place] [phone]   [phone]) [phone]  OFFICE PROGRESS NOTE  [name], [place] [address]  DIAGNOSIS:  1) Metastatic intermediate. Neuroendocrine tumor of lung primary diagnosed in January 2018 and presented with small bilateral pulmonary nodules in addition to multiple liver metastasis. 2) right lower lobe pulmonary embolism diagnosed incidentally on CT scan of the chest on 04/23/2017  PRIOR THERAPY:  1) Status post radio embolization with Y 90 to the liver lesions by interventional radiology. 2) status post IVC filter placement by interventional radiology on 04/24/2017  CURRENT THERAPY: Xeloda 750 MG/M2 twice a day days 1-14 and Temodar 150 MG/M2 days 10-14 every 4 weeks. Status post 11 cycles. She started cycle #11 on 05/22/2017.  INTERVAL HISTORY: [name] 83 y.o. female returns to the clinic today for follow-up visit accompanied by her husband.  The patient has no complaints today except for mild fatigue.  She denied having any chest pain, shortness of breath, cough or hemoptysis.  She denied having any nausea or vomiting.  She has no fever or chills.  She continues to tolerate her treatment with Xeloda and Temodar fairly well.  The patient had repeat CT scan of the chest, abdomen and pelvis performed recently and she is here for evaluation and discussion of her scan results.  MEDICAL HISTORY: Past Medical History:  Diagnosis Date  . Acute respiratory failure with hypoxia ([place]) 10/23/2015  . Allergic rhinitis    PT. DENIES  . Anxiety   . Arthritis    NECK  . Ataxia   . Bradycardia    primarily nocturnal  . Burning tongue syndrome 25 years  . Cancer ([place]) dx'd 06/2016   liver  . Cataract   . Cerebellar degeneration   . Chronic pericarditis   . Chronic urinary tract infection   . Complication of anesthesia    low o2 sats, coded 30 years ago  . CVA (cerebral infarction) 05/2003  . Depression   .  Encounter for antineoplastic chemotherapy 10/09/2016  . Gait disorder   . Gastric polyps   . GERD (gastroesophageal reflux disease)   . Goals of care, counseling/discussion 10/09/2016  . High cholesterol   . Hyperlipidemia   . Hypertension   . Hypertension   . Hypotension   . Hypothyroidism   . IBS (irritable bowel syndrome)   . Obesity   . Paroxysmal atrial fibrillation ([name])    chads2vasc score is 6,  she is felt to be a poor candidate for anticoagulation  . Personal history of arterial venous malformation (AVM)    right side of face  . Seizure disorder ([place])   . Seizures ([place]) 2003   " smelling"- Gabapentin "no problem"  . Shortness of breath dyspnea    with exertion  . Sternum fx 10/27/2013  . Stroke [facility]) 5 years ago   Right side of face weak, slurred speach-   . Thyroid disease   . TIA (transient ischemic attack)   . UTI (lower urinary tract infection) 03/27/2016   "frequently"    ALLERGIES:  is allergic to lisinopril.  MEDICATIONS:  Current Outpatient Medications  Medication Sig Dispense Refill  . acetaminophen (TYLENOL) 500 MG tablet Take 1,000 mg by mouth every 6 (six) hours as needed for mild pain or headache.     Marland Kitchen amitriptyline (ELAVIL) 25 MG tablet Take 25 mg by mouth at bedtime.    . capecitabine (XELODA) 500 MG tablet Take 1,500 mg  by mouth 2 (two) times daily after a meal.    . escitalopram (LEXAPRO) 20 MG tablet Take 1 tablet (20 mg total) by mouth at bedtime. 90 tablet 1  . fluticasone (FLONASE) 50 MCG/ACT nasal spray Place 2 sprays into both nostrils at bedtime. 16 g 3  . gabapentin (NEURONTIN) 400 MG capsule Take 400-800 mg by mouth 4 (four) times daily.    . hydroxypropyl methylcellulose / hypromellose (ISOPTO TEARS / GONIOVISC) 2.5 % ophthalmic solution Place 1 drop into both eyes at bedtime.    Marland Kitchen levothyroxine (SYNTHROID, LEVOTHROID) 75 MCG tablet TAKE 1 TABLET DAILY BEFORE BREAKFAST 90 tablet 0  . losartan (COZAAR) 50 MG tablet TAKE 1 TABLET DAILY 90  tablet 1  . lovastatin (MEVACOR) 40 MG tablet TAKE 1 TABLET AT BEDTIME 90 tablet 0  . Melatonin 3 MG TBDP Take 3-6 mg by mouth at bedtime as needed. 60 tablet 1  . omeprazole (PRILOSEC) 40 MG capsule TAKE 1 CAPSULE DAILY 90 capsule 0  . ondansetron (ZOFRAN) 8 MG tablet Take 1 tablet (8 mg total) by mouth every 8 (eight) hours as needed for nausea or vomiting. 60 tablet 0  . oxyCODONE-acetaminophen (PERCOCET) 5-325 MG tablet Take 1-2 tablets by mouth every 4 (four) hours as needed. 15 tablet 0  . rivaroxaban (XARELTO) 20 MG TABS tablet Take 1 tablet (20 mg total) daily with supper by mouth. 90 tablet 0  . temozolomide (TEMODAR) 140 MG capsule TAKE 2 CAPSULES BY MOUTH DAILY. MAY TAKE ON AN EMPTY STOMACH OR AT BEDTIME TO DECREASE NAUSEA AND VOMITING 10 capsule 2  . Vitamin D, Ergocalciferol, (DRISDOL) 50000 units CAPS capsule TAKE 1 CAPSULE EVERY 7 DAYS 12 capsule 0   No current facility-administered medications for this visit.     SURGICAL HISTORY:  Past Surgical History:  Procedure Laterality Date  . APPENDECTOMY  83 years old  . COLONOSCOPY  2006, 2009  . COLONOSCOPY WITH PROPOFOL N/A 03/28/2016   Procedure: COLONOSCOPY WITH PROPOFOL;  Surgeon: Gatha Mayer, MD;  Location: [facility];  Service: Endoscopy;  Laterality: N/A;  . cyst removed  35 years ago  . EP IMPLANTABLE DEVICE N/A 01/06/2015   Procedure: Loop Recorder Insertion;  Surgeon: Thompson Grayer, MD;  Location: [facility];  Service: Cardiovascular;  Laterality: N/A;  . EYE SURGERY Right    Cataract  . IR ANGIOGRAM SELECTIVE EACH ADDITIONAL VESSEL  11/28/2016  . IR ANGIOGRAM SELECTIVE EACH ADDITIONAL VESSEL  11/28/2016  . IR ANGIOGRAM SELECTIVE EACH ADDITIONAL VESSEL  11/28/2016  . IR ANGIOGRAM SELECTIVE EACH ADDITIONAL VESSEL  11/28/2016  . IR ANGIOGRAM SELECTIVE EACH ADDITIONAL VESSEL  12/13/2016  . IR ANGIOGRAM SELECTIVE EACH ADDITIONAL VESSEL  12/13/2016  . IR ANGIOGRAM SELECTIVE EACH ADDITIONAL VESSEL  01/09/2017  . IR  ANGIOGRAM VISCERAL SELECTIVE  11/28/2016  . IR ANGIOGRAM VISCERAL SELECTIVE  11/28/2016  . IR ANGIOGRAM VISCERAL SELECTIVE  12/13/2016  . IR ANGIOGRAM VISCERAL SELECTIVE  12/13/2016  . IR ANGIOGRAM VISCERAL SELECTIVE  01/09/2017  . IR EMBO ARTERIAL NOT HEMORR HEMANG INC GUIDE ROADMAPPING  11/28/2016  . IR EMBO TUMOR ORGAN ISCHEMIA INFARCT INC GUIDE ROADMAPPING  12/13/2016  . IR EMBO TUMOR ORGAN ISCHEMIA INFARCT INC GUIDE ROADMAPPING  01/09/2017  . IR IVC FILTER PLMT / S&I /IMG GUID/MOD SED  04/24/2017  . IR RADIOLOGIST EVAL & MGMT  11/06/2016  . IR RADIOLOGIST EVAL & MGMT  01/02/2017  . IR RADIOLOGIST EVAL & MGMT  02/05/2017  . IR RADIOLOGIST EVAL & MGMT  05/02/2017  . IR US GUIDE VASC ACCESS RIGHT  11/28/2016  . IR US GUIDE VASC ACCESS RIGHT  12/13/2016  . IR US GUIDE VASC ACCESS RIGHT  01/09/2017  . KNEE ARTHROSCOPY Right 11/14/2006  . KNEE ARTHROSCOPY Bilateral 5 and 6 years ago  . KNEE ARTHROSCOPY WITH LATERAL MENISECTOMY  07/03/2012   Procedure: KNEE ARTHROSCOPY WITH LATERAL MENISECTOMY;  Surgeon: Magnus Sinning, MD;  Location: [facility];  Service: Orthopedics;  Laterality: Left;  with Partial Lateral Menisectomy and Medial Menisectomy. Shaving of medial and lateral femoral condyles. Shaving of patella. Removal of a loose body  . tibial and fibular internal fixation Left   . TOTAL ABDOMINAL HYSTERECTOMY  83 years old  . UPPER GASTROINTESTINAL ENDOSCOPY  2009, 2013    REVIEW OF SYSTEMS:  Constitutional: positive for fatigue Eyes: negative Ears, nose, mouth, throat, and face: negative Respiratory: negative Cardiovascular: negative Gastrointestinal: negative Genitourinary:negative Integument/breast: negative Hematologic/lymphatic: negative Musculoskeletal:negative Neurological: negative Behavioral/Psych: negative Endocrine: negative Allergic/Immunologic: negative   PHYSICAL EXAMINATION: General appearance: alert, cooperative, fatigued and no distress Head: Normocephalic, without obvious  abnormality, atraumatic Neck: no adenopathy, no JVD, supple, symmetrical, trachea midline and thyroid not enlarged, symmetric, no tenderness/mass/nodules Lymph nodes: Cervical, supraclavicular, and axillary nodes normal. Resp: clear to auscultation bilaterally Back: symmetric, no curvature. ROM normal. No CVA tenderness. Cardio: regular rate and rhythm, S1, S2 normal, no murmur, click, rub or gallop GI: soft, non-tender; bowel sounds normal; no masses,  no organomegaly Extremities: extremities normal, atraumatic, no cyanosis or edema Neurologic: Alert and oriented X 3, normal strength and tone. Normal symmetric reflexes. Normal coordination and gait  ECOG PERFORMANCE STATUS: 1 - Symptomatic but completely ambulatory  Blood pressure (!) 118/41, pulse 79, temperature (!) 97.5 F (36.4 C), temperature source Oral, resp. rate 20, weight 226 lb 6.4 oz (102.7 kg), SpO2 95 %.  LABORATORY DATA: Lab Results  Component Value Date   WBC 3.1 (L) 07/10/2017   HGB 12.4 07/10/2017   HCT 37.4 07/10/2017   MCV 106.7 (H) 07/10/2017   PLT 146 07/10/2017      Chemistry      Component Value Date/Time   NA 135 (L) 07/10/2017 1142   K 4.6 07/10/2017 1142   CL 107 04/25/2017 0540   CO2 25 07/10/2017 1142   BUN 11.4 07/10/2017 1142   CREATININE 1.0 07/10/2017 1142      Component Value Date/Time   CALCIUM 9.9 07/10/2017 1142   ALKPHOS 93 07/10/2017 1142   AST 43 (H) 07/10/2017 1142   ALT 28 07/10/2017 1142   BILITOT 0.56 07/10/2017 1142       RADIOGRAPHIC STUDIES: Ct Chest W Contrast  Result Date: 07/10/2017 CLINICAL DATA:  Metastatic neuroendocrine carcinoma diagnosed January 2018, with liver metastases treated with Y-90 radioembolization in May / June 2018 with ongoing oral chemotherapy. Restaging. EXAM: CT CHEST, ABDOMEN, AND PELVIS WITH CONTRAST TECHNIQUE: Multidetector CT imaging of the chest, abdomen and pelvis was performed following the standard protocol during bolus administration of  intravenous contrast. CONTRAST:  147mL ISOVUE-300 IOPAMIDOL (ISOVUE-300) INJECTION 61% COMPARISON:  04/23/2017 CT chest, abdomen and pelvis. FINDINGS: CT CHEST FINDINGS Cardiovascular: Normal heart size. No significant pericardial fluid/thickening. Left anterior descending coronary atherosclerosis. Atherosclerotic thoracic aorta with stable ectatic 4.2 cm ascending thoracic aorta. Stable dilated main pulmonary artery (3.5 cm diameter). No central pulmonary emboli. Mediastinum/Nodes: No discrete thyroid nodules. Unremarkable esophagus. No pathologically enlarged axillary, mediastinal or hilar lymph nodes. Lungs/Pleura: No pneumothorax. No pleural effusion. Medial right upper lobe solid 1.3 cm pulmonary  nodule (series 7/ image 36), previously 1.3 cm on 04/23/2017 using similar measurement technique, stable. Several additional tiny solid pulmonary nodules scattered throughout both lungs measuring up to 4 mm in the left lower lobe (series 7/ image 58) are all unchanged in the interval. No acute consolidative airspace disease or new significant pulmonary nodules. Musculoskeletal: No aggressive appearing focal osseous lesions. Moderate thoracic spondylosis. Stable healed deformity in the mid sternum. Loop recorder device is noted in the subcutaneous medial ventral left chest wall. CT ABDOMEN PELVIS FINDINGS Hepatobiliary: Normal liver size. No hyperenhancing liver masses. Segment 8 right liver lobe 1.9 x 1.6 cm hypoenhancing mass (series 5/ image 64), previously 2.1 x 1.5 cm using similar measurement technique, not appreciably changed. Segment 2 left liver lobe 1.3 cm hypoenhancing mass (series 5/ image 57), previously 1.4 cm, not appreciably changed. Segment 3 left liver lobe 2.4 x 2.1 cm hypoenhancing mass (series 5/ image 71), previously 2.3 x 2.0 cm using similar measurement technique, not appreciably changed. No new liver masses. Contracted gallbladder with no radiopaque cholelithiasis. No biliary ductal dilatation.  Pancreas: No discrete pancreatic mass. No pancreatic duct dilation. Heterogeneous fatty infiltration of the pancreas. Spleen: Normal size. No mass. Adrenals/Urinary Tract: Normal adrenals. No hydronephrosis. Nonobstructing punctate 1-2 mm lower left renal stone. Hypodense subcentimeter interpolar right renal cortical lesion, too small to characterize, stable. No new renal lesions. Normal bladder. Stomach/Bowel: Grossly normal stomach. Normal caliber small bowel with no small bowel wall thickening. Appendectomy. Normal large bowel with no diverticulosis, large bowel wall thickening or pericolonic fat stranding. Vascular/Lymphatic: Atherosclerotic nonaneurysmal abdominal aorta. Infrarenal IVC filter is in place. No pathologically enlarged lymph nodes in the abdomen or pelvis. Reproductive: Status post hysterectomy, with no abnormal findings at the vaginal cuff. Simple 1.8 cm right adnexal cyst is stable. No left adnexal mass. Other: No pneumoperitoneum, ascites or focal fluid collection. Musculoskeletal: No aggressive appearing focal osseous lesions. Moderate lumbar spondylosis. IMPRESSION: 1. No new or progressive metastatic disease. 2. Pulmonary nodules and liver metastases are stable. 3. Chronic findings include: Aortic Atherosclerosis (ICD10-I70.0). Stable ectatic 4.2 cm ascending thoracic aorta. Coronary atherosclerosis. Punctate nonobstructing lower left renal stone. Electronically Signed   By: Ilona Sorrel M.D.   On: 07/10/2017 16:28   Ct Abdomen Pelvis W Contrast  Result Date: 07/10/2017 CLINICAL DATA:  Metastatic neuroendocrine carcinoma diagnosed January 2018, with liver metastases treated with Y-90 radioembolization in May / June 2018 with ongoing oral chemotherapy. Restaging. EXAM: CT CHEST, ABDOMEN, AND PELVIS WITH CONTRAST TECHNIQUE: Multidetector CT imaging of the chest, abdomen and pelvis was performed following the standard protocol during bolus administration of intravenous contrast. CONTRAST:   168mL ISOVUE-300 IOPAMIDOL (ISOVUE-300) INJECTION 61% COMPARISON:  04/23/2017 CT chest, abdomen and pelvis. FINDINGS: CT CHEST FINDINGS Cardiovascular: Normal heart size. No significant pericardial fluid/thickening. Left anterior descending coronary atherosclerosis. Atherosclerotic thoracic aorta with stable ectatic 4.2 cm ascending thoracic aorta. Stable dilated main pulmonary artery (3.5 cm diameter). No central pulmonary emboli. Mediastinum/Nodes: No discrete thyroid nodules. Unremarkable esophagus. No pathologically enlarged axillary, mediastinal or hilar lymph nodes. Lungs/Pleura: No pneumothorax. No pleural effusion. Medial right upper lobe solid 1.3 cm pulmonary nodule (series 7/ image 36), previously 1.3 cm on 04/23/2017 using similar measurement technique, stable. Several additional tiny solid pulmonary nodules scattered throughout both lungs measuring up to 4 mm in the left lower lobe (series 7/ image 58) are all unchanged in the interval. No acute consolidative airspace disease or new significant pulmonary nodules. Musculoskeletal: No aggressive appearing focal osseous lesions. Moderate thoracic spondylosis.  Stable healed deformity in the mid sternum. Loop recorder device is noted in the subcutaneous medial ventral left chest wall. CT ABDOMEN PELVIS FINDINGS Hepatobiliary: Normal liver size. No hyperenhancing liver masses. Segment 8 right liver lobe 1.9 x 1.6 cm hypoenhancing mass (series 5/ image 64), previously 2.1 x 1.5 cm using similar measurement technique, not appreciably changed. Segment 2 left liver lobe 1.3 cm hypoenhancing mass (series 5/ image 57), previously 1.4 cm, not appreciably changed. Segment 3 left liver lobe 2.4 x 2.1 cm hypoenhancing mass (series 5/ image 71), previously 2.3 x 2.0 cm using similar measurement technique, not appreciably changed. No new liver masses. Contracted gallbladder with no radiopaque cholelithiasis. No biliary ductal dilatation. Pancreas: No discrete pancreatic  mass. No pancreatic duct dilation. Heterogeneous fatty infiltration of the pancreas. Spleen: Normal size. No mass. Adrenals/Urinary Tract: Normal adrenals. No hydronephrosis. Nonobstructing punctate 1-2 mm lower left renal stone. Hypodense subcentimeter interpolar right renal cortical lesion, too small to characterize, stable. No new renal lesions. Normal bladder. Stomach/Bowel: Grossly normal stomach. Normal caliber small bowel with no small bowel wall thickening. Appendectomy. Normal large bowel with no diverticulosis, large bowel wall thickening or pericolonic fat stranding. Vascular/Lymphatic: Atherosclerotic nonaneurysmal abdominal aorta. Infrarenal IVC filter is in place. No pathologically enlarged lymph nodes in the abdomen or pelvis. Reproductive: Status post hysterectomy, with no abnormal findings at the vaginal cuff. Simple 1.8 cm right adnexal cyst is stable. No left adnexal mass. Other: No pneumoperitoneum, ascites or focal fluid collection. Musculoskeletal: No aggressive appearing focal osseous lesions. Moderate lumbar spondylosis. IMPRESSION: 1. No new or progressive metastatic disease. 2. Pulmonary nodules and liver metastases are stable. 3. Chronic findings include: Aortic Atherosclerosis (ICD10-I70.0). Stable ectatic 4.2 cm ascending thoracic aorta. Coronary atherosclerosis. Punctate nonobstructing lower left renal stone. Electronically Signed   By: Ilona Sorrel M.D.   On: 07/10/2017 16:28    ASSESSMENT AND PLAN:  This is a very pleasant 83 years old white female with metastatic intermediate grade neuroendocrine carcinoma of questionable lung primary and multiple metastatic liver lesions and pancreatic lesions.She status post treatment with radio embolization with Y90 to the left and right lobe liver lesions.Status post treatment with Y 90 to the left and right lobes of the liver. She is currently undergoing systemic chemotherapy with Xeloda and Temodar status post 11 cycles. The patient  continues to tolerate this treatment fairly well with no significant adverse effects. She had a recent CT scan of the chest, abdomen and pelvis performed recently that showed no evidence for disease progression. I discussed the scan results with the patient and her husband. I recommended for her to continue her current treatment with Xeloda and Temodar. I will see her back for follow-up visit in 1 month for evaluation with repeat blood work. For the recent diagnosis of pulmonary embolism, she will continue on Xarelto 20 mg p.o. Daily. The patient was advised to call immediately if she has any concerning symptoms in the interval. The patient voices understanding of current disease status and treatment options and is in agreement with the current care plan. All questions were answered. The patient knows to call the clinic with any problems, questions or concerns. We can certainly see the patient much sooner if necessary.  Disclaimer: This note was dictated with voice recognition software. Similar sounding words can inadvertently be transcribed and may not be corrected upon review.

## 2017-07-18 ENCOUNTER — Telehealth: Payer: Self-pay | Admitting: Pediatrics

## 2017-07-18 NOTE — Telephone Encounter (Signed)
Done 07/17/17

## 2017-07-18 NOTE — Telephone Encounter (Signed)
What is the name of the medication? Lexapro  Have you contacted your pharmacy to request a refill? NO  Which pharmacy would you like this sent to? [facility]   Patient notified that their request is being sent to the clinical staff for review and that they should receive a call once it is complete. If they do not receive a call within 24 hours they can check with their pharmacy or our office.

## 2017-07-22 ENCOUNTER — Other Ambulatory Visit: Payer: Self-pay | Admitting: Pediatrics

## 2017-07-22 DIAGNOSIS — E785 Hyperlipidemia, unspecified: Secondary | ICD-10-CM

## 2017-07-22 DIAGNOSIS — K219 Gastro-esophageal reflux disease without esophagitis: Secondary | ICD-10-CM

## 2017-07-24 ENCOUNTER — Ambulatory Visit (INDEPENDENT_AMBULATORY_CARE_PROVIDER_SITE_OTHER): Payer: Medicare Other | Admitting: *Deleted

## 2017-07-24 DIAGNOSIS — R002 Palpitations: Secondary | ICD-10-CM

## 2017-07-25 NOTE — Progress Notes (Signed)
Carelink Summary Report / Loop Recorder 

## 2017-08-04 LAB — CUP PACEART REMOTE DEVICE CHECK
Implantable Pulse Generator Implant Date: 20160609
MDC IDC SESS DTM: 20181226230949

## 2017-08-07 MED FILL — TEMOZOLOMIDE 140 MG CAPSULE: 140 | 5 days supply | Qty: 10 | Fill #2

## 2017-08-07 MED FILL — CAPECITABINE 500 MG TABLET: 500 | 14 days supply | Qty: 84 | Fill #2

## 2017-08-08 ENCOUNTER — Ambulatory Visit (INDEPENDENT_AMBULATORY_CARE_PROVIDER_SITE_OTHER): Payer: Medicare Other | Admitting: Pediatrics

## 2017-08-08 ENCOUNTER — Encounter: Payer: Self-pay | Admitting: Pediatrics

## 2017-08-08 VITALS — BP 133/77 | HR 73 | Temp 97.3°F | Ht 64.0 in | Wt 230.0 lb

## 2017-08-08 DIAGNOSIS — F329 Major depressive disorder, single episode, unspecified: Secondary | ICD-10-CM | POA: Diagnosis not present

## 2017-08-08 DIAGNOSIS — K219 Gastro-esophageal reflux disease without esophagitis: Secondary | ICD-10-CM | POA: Diagnosis not present

## 2017-08-08 DIAGNOSIS — R609 Edema, unspecified: Secondary | ICD-10-CM | POA: Diagnosis not present

## 2017-08-08 DIAGNOSIS — F411 Generalized anxiety disorder: Secondary | ICD-10-CM | POA: Diagnosis not present

## 2017-08-08 DIAGNOSIS — E785 Hyperlipidemia, unspecified: Secondary | ICD-10-CM | POA: Diagnosis not present

## 2017-08-08 DIAGNOSIS — Z889 Allergy status to unspecified drugs, medicaments and biological substances status: Secondary | ICD-10-CM | POA: Diagnosis not present

## 2017-08-08 DIAGNOSIS — E038 Other specified hypothyroidism: Secondary | ICD-10-CM | POA: Diagnosis not present

## 2017-08-08 DIAGNOSIS — F32A Depression, unspecified: Secondary | ICD-10-CM

## 2017-08-08 MED ORDER — FLUTICASONE PROPIONATE 50 MCG/ACT NA SUSP: 2.0000 | Freq: Every day | NASAL | 3 refills | Status: DC

## 2017-08-08 MED ORDER — ONDANSETRON HCL 8 MG PO TABS: 8.0000 mg | ORAL_TABLET | Freq: Three times a day (TID) | ORAL | 1 refills | Status: DC | PRN

## 2017-08-08 MED ORDER — LEVOTHYROXINE SODIUM 75 MCG PO TABS: 75.0000 ug | ORAL_TABLET | Freq: Every day | ORAL | 1 refills | Status: DC

## 2017-08-08 MED ORDER — ESCITALOPRAM OXALATE 20 MG PO TABS: 20.0000 mg | ORAL_TABLET | Freq: Every day | ORAL | 1 refills | Status: DC

## 2017-08-08 MED ORDER — LOVASTATIN 40 MG PO TABS: 40.0000 mg | ORAL_TABLET | Freq: Every day | ORAL | 1 refills | Status: DC

## 2017-08-08 MED ORDER — OMEPRAZOLE 40 MG PO CPDR: 40.0000 mg | DELAYED_RELEASE_CAPSULE | Freq: Every day | ORAL | 1 refills | Status: DC

## 2017-08-08 MED ORDER — FUROSEMIDE 20 MG PO TABS: 20.0000 mg | ORAL_TABLET | Freq: Every day | ORAL | 0 refills | Status: DC | PRN

## 2017-08-08 MED ORDER — GABAPENTIN 400 MG PO CAPS: 400.0000 mg | ORAL_CAPSULE | Freq: Three times a day (TID) | ORAL | 1 refills | Status: DC

## 2017-08-08 MED ORDER — AMITRIPTYLINE HCL 25 MG PO TABS: 25.0000 mg | ORAL_TABLET | Freq: Every day | ORAL | 1 refills | Status: DC

## 2017-08-08 NOTE — Progress Notes (Signed)
Subjective:   Patient ID: [name], female    DOB: 1933/12/28, 84 y.o.   MRN: [id number] CC: Follow-up (3 month) multiple med problems HPI: [name] is a 84 y.o. female presenting for Follow-up (3 month)  Has neuroendocrine cancer, following with Dr Inda Merlin Getting chemotherapy now  Falls asleep easily during the day No snoring, has had sleep study in the past that she reports as normal Mood has been fine Takes amitriptyline at night which she says helps keep her calm and let her sleep Taking lexapro daily for anxiety as well, symptoms controlled  Some more smelling seizures, taking gabapentin regularly, follows with neurology  Afib: no heart palpitations, has had 5 sec pause on ILR, following with Dr Rayann Heman  Has had some falls, can stand, partially weight bearing to transfer from bed to wheelchair  Swelling in legs: L>R, had DVT L leg 3 mo ago, now on xarelto and with IVC filter Has had weeping of her leg a couple of times last week, swelling better today  Relevant past medical, surgical, family and social history reviewed. Allergies and medications reviewed and updated. Social History   Tobacco Use  Smoking Status Former Smoker  . Packs/day: 0.25  . Years: 10.00  . Pack years: 2.50  . Types: Cigarettes  . Last attempt to quit: 07/31/1975  . Years since quitting: 42.0  Smokeless Tobacco Never Used   ROS: Per HPI   Objective:    BP 133/77   Pulse 73   Temp (!) 97.3 F (36.3 C) (Oral)   Ht 5\' 4"  (1.626 m)   Wt 230 lb (104.3 kg)   BMI 39.48 kg/m   Wt Readings from Last 3 Encounters:  08/08/17 230 lb (104.3 kg)  07/15/17 226 lb 6.4 oz (102.7 kg)  07/01/17 228 lb 3.2 oz (103.5 kg)    Gen: NAD, alert, cooperative with exam, NCAT, in wheelchair EYES: EOMI, no conjunctival injection, or no icterus ENT:  OP without erythema LYMPH: no cervical LAD CV: NRRR, normal S1/S2, no murmur, distal pulses 2+ b/l Resp: CTABL, no wheezes, normal WOB Abd: +BS,  soft, NTND. no guarding or organomegaly Ext: 1+ pitting edema b/l ankles, no weeping, warm Neuro: Alert and oriented Psych: nl affect, mood is "good"  Assessment & Plan:  Shaquna was seen today for follow-up multiple med problems  Diagnoses and all orders for this visit:  Swelling B/l ankles, better now than it was last week per patient Take Lasix once a day in the morning for the next 3 days Then take as needed for swelling that is present in the morning Try to keep feet propped up during the day -     furosemide (LASIX) 20 MG tablet; Take 1 tablet (20 mg total) by mouth daily as needed.  Depression, unspecified depression type Stable, continue below -     escitalopram (LEXAPRO) 20 MG tablet; Take 1 tablet (20 mg total) by mouth at bedtime.  GAD (generalized anxiety disorder) Stable, continue meds -     escitalopram (LEXAPRO) 20 MG tablet; Take 1 tablet (20 mg total) by mouth at bedtime.  Multiple allergies -     fluticasone (FLONASE) 50 MCG/ACT nasal spray; Place 2 sprays into both nostrils at bedtime.  Other specified hypothyroidism Recent TSH within normal limits, continue below -     levothyroxine (SYNTHROID, LEVOTHROID) 75 MCG tablet; Take 1 tablet (75 mcg total) by mouth daily before breakfast.  Hyperlipemia -     lovastatin (MEVACOR) 40 MG  tablet; Take 1 tablet (40 mg total) by mouth at bedtime.  Gastroesophageal reflux disease, esophagitis presence not specified -     omeprazole (PRILOSEC) 40 MG capsule; Take 1 capsule (40 mg total) by mouth daily.   Follow up plan: Return in about 3 months (around 11/06/2017). Assunta Found, MD [place]

## 2017-08-13 ENCOUNTER — Encounter: Payer: Self-pay | Admitting: Internal Medicine

## 2017-08-13 ENCOUNTER — Inpatient Hospital Stay: Payer: Medicare Other | Attending: Internal Medicine | Admitting: Internal Medicine

## 2017-08-13 ENCOUNTER — Telehealth: Payer: Self-pay | Admitting: Internal Medicine

## 2017-08-13 ENCOUNTER — Inpatient Hospital Stay: Payer: Medicare Other

## 2017-08-13 DIAGNOSIS — K219 Gastro-esophageal reflux disease without esophagitis: Secondary | ICD-10-CM | POA: Diagnosis not present

## 2017-08-13 DIAGNOSIS — C787 Secondary malignant neoplasm of liver and intrahepatic bile duct: Secondary | ICD-10-CM | POA: Diagnosis not present

## 2017-08-13 DIAGNOSIS — F329 Major depressive disorder, single episode, unspecified: Secondary | ICD-10-CM | POA: Insufficient documentation

## 2017-08-13 DIAGNOSIS — E669 Obesity, unspecified: Secondary | ICD-10-CM | POA: Insufficient documentation

## 2017-08-13 DIAGNOSIS — E7800 Pure hypercholesterolemia, unspecified: Secondary | ICD-10-CM | POA: Insufficient documentation

## 2017-08-13 DIAGNOSIS — C7A8 Other malignant neuroendocrine tumors: Secondary | ICD-10-CM | POA: Diagnosis not present

## 2017-08-13 DIAGNOSIS — C7800 Secondary malignant neoplasm of unspecified lung: Secondary | ICD-10-CM | POA: Diagnosis not present

## 2017-08-13 DIAGNOSIS — I10 Essential (primary) hypertension: Secondary | ICD-10-CM | POA: Diagnosis not present

## 2017-08-13 DIAGNOSIS — Z8673 Personal history of transient ischemic attack (TIA), and cerebral infarction without residual deficits: Secondary | ICD-10-CM | POA: Diagnosis not present

## 2017-08-13 DIAGNOSIS — Z5111 Encounter for antineoplastic chemotherapy: Secondary | ICD-10-CM

## 2017-08-13 DIAGNOSIS — Z86711 Personal history of pulmonary embolism: Secondary | ICD-10-CM | POA: Diagnosis not present

## 2017-08-13 DIAGNOSIS — I480 Paroxysmal atrial fibrillation: Secondary | ICD-10-CM | POA: Diagnosis not present

## 2017-08-13 DIAGNOSIS — C7B8 Other secondary neuroendocrine tumors: Principal | ICD-10-CM

## 2017-08-13 DIAGNOSIS — E039 Hypothyroidism, unspecified: Secondary | ICD-10-CM | POA: Insufficient documentation

## 2017-08-13 DIAGNOSIS — K589 Irritable bowel syndrome without diarrhea: Secondary | ICD-10-CM | POA: Insufficient documentation

## 2017-08-13 DIAGNOSIS — F419 Anxiety disorder, unspecified: Secondary | ICD-10-CM | POA: Diagnosis not present

## 2017-08-13 DIAGNOSIS — Z79899 Other long term (current) drug therapy: Secondary | ICD-10-CM | POA: Insufficient documentation

## 2017-08-13 DIAGNOSIS — Z7901 Long term (current) use of anticoagulants: Secondary | ICD-10-CM | POA: Diagnosis not present

## 2017-08-13 LAB — COMPREHENSIVE METABOLIC PANEL
ALBUMIN: 3.7 g/dL (ref 3.5–5.0)
ALT: 19 U/L (ref 0–55)
ANION GAP: 8 (ref 3–11)
AST: 27 U/L (ref 5–34)
Alkaline Phosphatase: 103 U/L (ref 40–150)
BILIRUBIN TOTAL: 0.6 mg/dL (ref 0.2–1.2)
BUN: 13 mg/dL (ref 7–26)
CO2: 27 mmol/L (ref 22–29)
Calcium: 9.7 mg/dL (ref 8.4–10.4)
Chloride: 99 mmol/L (ref 98–109)
Creatinine, Ser: 1 mg/dL (ref 0.60–1.10)
GFR calc Af Amer: 60 mL/min — ABNORMAL LOW (ref >60)
GFR calc non Af Amer: 51 mL/min — ABNORMAL LOW (ref >60)
Glucose, Bld: 99 mg/dL (ref 70–140)
POTASSIUM: 4.1 mmol/L (ref 3.3–4.7)
Sodium: 134 mmol/L — ABNORMAL LOW (ref 136–145)
TOTAL PROTEIN: 7 g/dL (ref 6.4–8.3)

## 2017-08-13 LAB — CBC WITH DIFFERENTIAL/PLATELET
Basophils Absolute: 0 10*3/uL (ref 0.0–0.1)
Basophils Relative: 1 %
EOS PCT: 5 %
Eosinophils Absolute: 0.2 10*3/uL (ref 0.0–0.5)
HEMATOCRIT: 38.2 % (ref 34.8–46.6)
Hemoglobin: 12.7 g/dL (ref 11.6–15.9)
LYMPHS PCT: 21 %
Lymphs Abs: 0.7 10*3/uL — ABNORMAL LOW (ref 0.9–3.3)
MCH: 34.7 pg — AB (ref 25.1–34.0)
MCHC: 33.2 g/dL (ref 31.5–36.0)
MCV: 104.5 fL — AB (ref 79.5–101.0)
MONO ABS: 0.6 10*3/uL (ref 0.1–0.9)
Monocytes Relative: 17 %
NEUTROS ABS: 2 10*3/uL (ref 1.5–6.5)
Neutrophils Relative %: 56 %
PLATELETS: 124 10*3/uL — AB (ref 145–400)
RBC: 3.65 MIL/uL — ABNORMAL LOW (ref 3.70–5.45)
RDW: 17.4 % — AB (ref 11.2–16.1)
WBC: 3.5 10*3/uL — ABNORMAL LOW (ref 3.9–10.3)

## 2017-08-13 MED ORDER — OXYCODONE-ACETAMINOPHEN 5-325 MG PO TABS: 1.0000 | ORAL_TABLET | Freq: Three times a day (TID) | ORAL | 0 refills | Status: DC | PRN

## 2017-08-13 MED FILL — OXYCOD/ACETAMINOPHEN 5-325M: 5-325 | 10 days supply | Qty: 30 | Fill #0

## 2017-08-13 NOTE — Telephone Encounter (Signed)
Scheduled appt per 1/15 los - Gave patient AVS and calender per los.  

## 2017-08-13 NOTE — Progress Notes (Signed)
[name] [phone]   [phone]  OFFICE PROGRESS NOTE  [name], [place] [address]  DIAGNOSIS:  1) Metastatic intermediate. Neuroendocrine tumor of lung primary diagnosed in January 2018 and presented with small bilateral pulmonary nodules in addition to multiple liver metastasis. 2) right lower lobe pulmonary embolism diagnosed incidentally on CT scan of the chest on 04/23/2017  PRIOR THERAPY:  1) Status post radio embolization with Y 90 to the liver lesions by interventional radiology. 2) status post IVC filter placement by interventional radiology on 04/24/2017  CURRENT THERAPY: Xeloda 750 MG/M2 twice a day days 1-14 and Temodar 150 MG/M2 days 10-14 every 4 weeks. Status post 13 cycles. She will start cycle #14 on August 18, 2017.  INTERVAL HISTORY: [name] 84 y.o. female returns to the clinic today for follow-up visit accompanied by her husband.  The patient is feeling fine today with no specific complaints except for arthritis of the knees.  She was treated with knee injection in the past with no improvement in her condition.  She is requesting refill of some of her pain medication that was prescribed by her primary care physician.  She denied having any fever or chills.  She has no nausea, vomiting, diarrhea or constipation.  She has no recent weight loss or night sweats.  She has no chest pain, shortness of breath, cough or hemoptysis.  She is here today for evaluation and repeat blood work.   MEDICAL HISTORY: Past Medical History:  Diagnosis Date  . Acute respiratory failure with hypoxia ([place]) 10/23/2015  . Allergic rhinitis    PT. DENIES  . Anxiety   . Arthritis    NECK  . Ataxia   . Bradycardia    primarily nocturnal  . Burning tongue syndrome 25 years  . Cancer ([place]) dx'd 06/2016   liver  . Cataract   . Cerebellar degeneration   . Chronic pericarditis   . Chronic urinary tract  infection   . Complication of anesthesia    low o2 sats, coded 30 years ago  . CVA (cerebral infarction) 05/2003  . Depression   . Encounter for antineoplastic chemotherapy 10/09/2016  . Gait disorder   . Gastric polyps   . GERD (gastroesophageal reflux disease)   . Goals of care, counseling/discussion 10/09/2016  . High cholesterol   . Hyperlipidemia   . Hypertension   . Hypertension   . Hypotension   . Hypothyroidism   . IBS (irritable bowel syndrome)   . Obesity   . Paroxysmal atrial fibrillation ([name])    chads2vasc score is 6,  she is felt to be a poor candidate for anticoagulation  . Personal history of arterial venous malformation (AVM)    right side of face  . Seizure disorder ([place])   . Seizures ([place]) 2003   " smelling"- Gabapentin "no problem"  . Shortness of breath dyspnea    with exertion  . Sternum fx 10/27/2013  . Stroke [facility]) 5 years ago   Right side of face weak, slurred speach-   . Thyroid disease   . TIA (transient ischemic attack)   . UTI (lower urinary tract infection) 03/27/2016   "frequently"    ALLERGIES:  is allergic to lisinopril.  MEDICATIONS:  Current Outpatient Medications  Medication Sig Dispense Refill  . acetaminophen (TYLENOL) 500 MG tablet Take 1,000 mg by mouth every 6 (six) hours as needed for mild pain or headache.     Marland Kitchen  amitriptyline (ELAVIL) 25 MG tablet Take 1 tablet (25 mg total) by mouth at bedtime. 90 tablet 1  . capecitabine (XELODA) 500 MG tablet Take 1,500 mg by mouth 2 (two) times daily after a meal.    . escitalopram (LEXAPRO) 20 MG tablet Take 1 tablet (20 mg total) by mouth at bedtime. 90 tablet 1  . fluticasone (FLONASE) 50 MCG/ACT nasal spray Place 2 sprays into both nostrils at bedtime. 16 g 3  . furosemide (LASIX) 20 MG tablet Take 1 tablet (20 mg total) by mouth daily as needed. 90 tablet 0  . gabapentin (NEURONTIN) 400 MG capsule Take 1-2 capsules (400-800 mg total) by mouth 3 (three) times daily. 720 capsule 1  .  hydroxypropyl methylcellulose / hypromellose (ISOPTO TEARS / GONIOVISC) 2.5 % ophthalmic solution Place 1 drop into both eyes at bedtime.    Marland Kitchen levothyroxine (SYNTHROID, LEVOTHROID) 75 MCG tablet Take 1 tablet (75 mcg total) by mouth daily before breakfast. 90 tablet 1  . losartan (COZAAR) 50 MG tablet TAKE 1 TABLET DAILY 90 tablet 1  . lovastatin (MEVACOR) 40 MG tablet Take 1 tablet (40 mg total) by mouth at bedtime. 90 tablet 1  . Melatonin 3 MG TBDP Take 3-6 mg by mouth at bedtime as needed. 60 tablet 1  . omeprazole (PRILOSEC) 40 MG capsule Take 1 capsule (40 mg total) by mouth daily. 90 capsule 1  . ondansetron (ZOFRAN) 8 MG tablet Take 1 tablet (8 mg total) by mouth every 8 (eight) hours as needed for nausea or vomiting. 90 tablet 1  . oxyCODONE-acetaminophen (PERCOCET) 5-325 MG tablet Take 1-2 tablets by mouth every 4 (four) hours as needed. 15 tablet 0  . rivaroxaban (XARELTO) 20 MG TABS tablet Take 1 tablet (20 mg total) daily with supper by mouth. 90 tablet 0  . temozolomide (TEMODAR) 140 MG capsule TAKE 2 CAPSULES BY MOUTH DAILY. MAY TAKE ON AN EMPTY STOMACH OR AT BEDTIME TO DECREASE NAUSEA AND VOMITING 10 capsule 2  . Vitamin D, Ergocalciferol, (DRISDOL) 50000 units CAPS capsule TAKE 1 CAPSULE EVERY 7 DAYS 12 capsule 0   No current facility-administered medications for this visit.     SURGICAL HISTORY:  Past Surgical History:  Procedure Laterality Date  . APPENDECTOMY  84 years old  . COLONOSCOPY  2006, 2009  . COLONOSCOPY WITH PROPOFOL N/A 03/28/2016   Procedure: COLONOSCOPY WITH PROPOFOL;  Surgeon: Gatha Mayer, MD;  Location: [facility];  Service: Endoscopy;  Laterality: N/A;  . cyst removed  35 years ago  . EP IMPLANTABLE DEVICE N/A 01/06/2015   Procedure: Loop Recorder Insertion;  Surgeon: Thompson Grayer, MD;  Location: [facility];  Service: Cardiovascular;  Laterality: N/A;  . EYE SURGERY Right    Cataract  . IR ANGIOGRAM SELECTIVE EACH ADDITIONAL VESSEL  11/28/2016    . IR ANGIOGRAM SELECTIVE EACH ADDITIONAL VESSEL  11/28/2016  . IR ANGIOGRAM SELECTIVE EACH ADDITIONAL VESSEL  11/28/2016  . IR ANGIOGRAM SELECTIVE EACH ADDITIONAL VESSEL  11/28/2016  . IR ANGIOGRAM SELECTIVE EACH ADDITIONAL VESSEL  12/13/2016  . IR ANGIOGRAM SELECTIVE EACH ADDITIONAL VESSEL  12/13/2016  . IR ANGIOGRAM SELECTIVE EACH ADDITIONAL VESSEL  01/09/2017  . IR ANGIOGRAM VISCERAL SELECTIVE  11/28/2016  . IR ANGIOGRAM VISCERAL SELECTIVE  11/28/2016  . IR ANGIOGRAM VISCERAL SELECTIVE  12/13/2016  . IR ANGIOGRAM VISCERAL SELECTIVE  12/13/2016  . IR ANGIOGRAM VISCERAL SELECTIVE  01/09/2017  . IR EMBO ARTERIAL NOT HEMORR HEMANG INC GUIDE ROADMAPPING  11/28/2016  . IR EMBO  TUMOR ORGAN ISCHEMIA INFARCT INC GUIDE ROADMAPPING  12/13/2016  . IR EMBO TUMOR ORGAN ISCHEMIA INFARCT INC GUIDE ROADMAPPING  01/09/2017  . IR IVC FILTER PLMT / S&I /IMG GUID/MOD SED  04/24/2017  . IR RADIOLOGIST EVAL & MGMT  11/06/2016  . IR RADIOLOGIST EVAL & MGMT  01/02/2017  . IR RADIOLOGIST EVAL & MGMT  02/05/2017  . IR RADIOLOGIST EVAL & MGMT  05/02/2017  . IR US GUIDE VASC ACCESS RIGHT  11/28/2016  . IR US GUIDE VASC ACCESS RIGHT  12/13/2016  . IR US GUIDE VASC ACCESS RIGHT  01/09/2017  . KNEE ARTHROSCOPY Right 11/14/2006  . KNEE ARTHROSCOPY Bilateral 5 and 6 years ago  . KNEE ARTHROSCOPY WITH LATERAL MENISECTOMY  07/03/2012   Procedure: KNEE ARTHROSCOPY WITH LATERAL MENISECTOMY;  Surgeon: Magnus Sinning, MD;  Location: [facility];  Service: Orthopedics;  Laterality: Left;  with Partial Lateral Menisectomy and Medial Menisectomy. Shaving of medial and lateral femoral condyles. Shaving of patella. Removal of a loose body  . tibial and fibular internal fixation Left   . TOTAL ABDOMINAL HYSTERECTOMY  84 years old  . UPPER GASTROINTESTINAL ENDOSCOPY  2009, 2013    REVIEW OF SYSTEMS:  A comprehensive review of systems was negative except for: Constitutional: positive for fatigue Musculoskeletal: positive for arthralgias   PHYSICAL  EXAMINATION: General appearance: alert, cooperative, fatigued and no distress Head: Normocephalic, without obvious abnormality, atraumatic Neck: no adenopathy, no JVD, supple, symmetrical, trachea midline and thyroid not enlarged, symmetric, no tenderness/mass/nodules Lymph nodes: Cervical, supraclavicular, and axillary nodes normal. Resp: clear to auscultation bilaterally Back: symmetric, no curvature. ROM normal. No CVA tenderness. Cardio: regular rate and rhythm, S1, S2 normal, no murmur, click, rub or gallop GI: soft, non-tender; bowel sounds normal; no masses,  no organomegaly Extremities: extremities normal, atraumatic, no cyanosis or edema  ECOG PERFORMANCE STATUS: 1 - Symptomatic but completely ambulatory  Blood pressure (!) 125/59, pulse 76, temperature 97.6 F (36.4 C), temperature source Oral, resp. rate 18, height 5\' 4"  (1.626 m), weight 226 lb 11.2 oz (102.8 kg), SpO2 100 %.  LABORATORY DATA: Lab Results  Component Value Date   WBC 3.5 (L) 08/13/2017   HGB 12.7 08/13/2017   HCT 38.2 08/13/2017   MCV 104.5 (H) 08/13/2017   PLT 124 (L) 08/13/2017      Chemistry      Component Value Date/Time   NA 134 (L) 08/13/2017 1425   NA 135 (L) 07/10/2017 1142   K 4.1 08/13/2017 1425   K 4.6 07/10/2017 1142   CL 99 08/13/2017 1425   CO2 27 08/13/2017 1425   CO2 25 07/10/2017 1142   BUN 13 08/13/2017 1425   BUN 11.4 07/10/2017 1142   CREATININE 1.00 08/13/2017 1425   CREATININE 1.0 07/10/2017 1142      Component Value Date/Time   CALCIUM 9.7 08/13/2017 1425   CALCIUM 9.9 07/10/2017 1142   ALKPHOS 103 08/13/2017 1425   ALKPHOS 93 07/10/2017 1142   AST 27 08/13/2017 1425   AST 43 (H) 07/10/2017 1142   ALT 19 08/13/2017 1425   ALT 28 07/10/2017 1142   BILITOT 0.6 08/13/2017 1425   BILITOT 0.56 07/10/2017 1142       RADIOGRAPHIC STUDIES: No results found.  ASSESSMENT AND PLAN:  This is a very pleasant 84 years old white female with metastatic intermediate grade  neuroendocrine carcinoma of questionable lung primary and multiple metastatic liver lesions and pancreatic lesions.She status post treatment with radio embolization with Y90 to the left  and right lobe liver lesions.Status post treatment with Y 90 to the left and right lobes of the liver. She is currently undergoing systemic chemotherapy with Xeloda and Temodar status post 13 cycles. The patient tolerated the last month of her treatment well with no concerning complaints. I recommended for her to proceed with cycle #14 on August 18, 2017 as scheduled. I will see her back for follow-up visit in 1 month for evaluation with repeat CBC and complaints metabolic panel. For the recent diagnosis of pulmonary embolism, she will continue on Xarelto 20 mg p.o. Daily. The patient was advised to call immediately if she has any concerning symptoms in the interval. The patient voices understanding of current disease status and treatment options and is in agreement with the current care plan. All questions were answered. The patient knows to call the clinic with any problems, questions or concerns. We can certainly see the patient much sooner if necessary.  Disclaimer: This note was dictated with voice recognition software. Similar sounding words can inadvertently be transcribed and may not be corrected upon review.

## 2017-08-21 ENCOUNTER — Telehealth: Payer: Self-pay | Admitting: Cardiovascular Disease

## 2017-08-21 ENCOUNTER — Telehealth: Payer: Self-pay | Admitting: Internal Medicine

## 2017-08-21 NOTE — Telephone Encounter (Signed)
Informed patient that she hasn't had any episodes recorded on her ILR. I asked her if she had a symptom activator. Patient states that she may have one somewhere, but her husband would be the one to know where it is and he's not home. Patient states that she will try using the symptom activator once her husband returns home.

## 2017-08-21 NOTE — Telephone Encounter (Signed)
Transmission from today @ 14:54 reviewed. Presenting rhythm: NSR, regular. No episodes recorded, last AF was on 06/24/17.  Attempted to call patient x 2-busy line.

## 2017-08-21 NOTE — Telephone Encounter (Signed)
New Message   Patient c/o Palpitations:  High priority if patient c/o lightheadedness, shortness of breath, or chest pain  1) How long have you had palpitations/irregular HR/ Afib? Are you having the symptoms now? Yes, patient is experiencing irregular and skip beats.  2) Are you currently experiencing lightheadedness, SOB or CP? SOB   3) Do you have a history of afib (atrial fibrillation) or irregular heart rhythm? Yes   4) Have you checked your BP or HR? (document readings if available): no  5) Are you experiencing any other symptoms? Aside from sob no

## 2017-08-21 NOTE — Telephone Encounter (Signed)
Disregard

## 2017-08-21 NOTE — Telephone Encounter (Signed)
Incoming call from the patient. She stated that she has been experiencing a high number of skipped beats today. They come around every 20 minutes. She denies chest pain but did state that her shortness of breath has gotten worse over the past two weeks.  Message routed to the device clinic for their recommendation.

## 2017-08-22 ENCOUNTER — Ambulatory Visit (INDEPENDENT_AMBULATORY_CARE_PROVIDER_SITE_OTHER): Payer: Medicare Other

## 2017-08-22 ENCOUNTER — Encounter: Payer: Self-pay | Admitting: Family Medicine

## 2017-08-22 ENCOUNTER — Ambulatory Visit (INDEPENDENT_AMBULATORY_CARE_PROVIDER_SITE_OTHER): Payer: Medicare Other | Admitting: Family Medicine

## 2017-08-22 VITALS — BP 111/64 | HR 56 | Temp 96.7°F | Ht 64.0 in | Wt 229.2 lb

## 2017-08-22 DIAGNOSIS — R079 Chest pain, unspecified: Secondary | ICD-10-CM

## 2017-08-22 DIAGNOSIS — I480 Paroxysmal atrial fibrillation: Secondary | ICD-10-CM

## 2017-08-22 DIAGNOSIS — I2699 Other pulmonary embolism without acute cor pulmonale: Secondary | ICD-10-CM | POA: Diagnosis not present

## 2017-08-22 NOTE — Progress Notes (Signed)
Subjective:  Patient ID: [name], female    DOB: [birth year]  Age: 84 y.o. MRN: [id number]  CC: Shortness of Breath (pt here today c/o SOB and some palpitations yesterday and last night and called her Cardiologist but says she is feeling somewhat better now)   HPI [name] presents for severe palpitations all day yesterday.  She said her heart will beat hard than normal then it would be irregular and then it would be regular.  It seemed to calm down toward the evening yesterday.  However, during the night she became very short of breath.  She could not get comfortable due to shortness of breath she could not lay down but it did not go away when she sat up.  In the middle of the night she had some anterior chest pressure that lasted for 30 minutes or more.  She took it a dose from her inhaler and that helped some.  Of note is that she has active chemo for a neuroendocrine cancer.  She also has history of pulmonary embolism.  Depression screen [facility] 2/9 08/22/2017 08/08/2017 06/19/2017  Decreased Interest 0 0 0  Down, Depressed, Hopeless 0 0 0  PHQ - 2 Score 0 0 0  Altered sleeping - - -  Tired, decreased energy - - -  Change in appetite - - -  Feeling bad or failure about yourself  - - -  Trouble concentrating - - -  Moving slowly or fidgety/restless - - -  Suicidal thoughts - - -  PHQ-9 Score - - -  Difficult doing work/chores - - -  Some recent data might be hidden    History [name] has a past medical history of Acute respiratory failure with hypoxia ([place]) (10/23/2015), Allergic rhinitis, Anxiety, Arthritis, Ataxia, Bradycardia, Burning tongue syndrome (25 years), Cancer ([name]) (dx'd 06/2016), Cataract, Cerebellar degeneration, Chronic pericarditis, Chronic urinary tract infection, Complication of anesthesia, CVA (cerebral infarction) (05/2003), Depression, Encounter for antineoplastic chemotherapy (10/09/2016), Gait disorder, Gastric polyps, GERD (gastroesophageal reflux  disease), Goals of care, counseling/discussion (10/09/2016), High cholesterol, Hyperlipidemia, Hypertension, Hypertension, Hypotension, Hypothyroidism, IBS (irritable bowel syndrome), Obesity, Paroxysmal atrial fibrillation ([place]), Personal history of arterial venous malformation (AVM), Seizure disorder ([place]), Seizures ([place]) (2003), Shortness of breath dyspnea, Sternum fx (10/27/2013), Stroke ([place]) (5 years ago), Thyroid disease, TIA (transient ischemic attack), and UTI (lower urinary tract infection) (03/27/2016).   She has a past surgical history that includes Knee arthroscopy (Right, 11/14/2006); tibial and fibular internal fixation (Left); Upper gastrointestinal endoscopy (2009, 2013); Colonoscopy (2006, 2009); Appendectomy (84 years old); cyst removed (35 years ago); Total abdominal hysterectomy (84 years old); Knee arthroscopy (Bilateral, 5 and 6 years ago); Knee arthroscopy with lateral menisectomy (07/03/2012); Cardiac catheterization (N/A, 01/06/2015); Eye surgery (Right); Colonoscopy with propofol (N/A, 03/28/2016); IR Angiogram Visceral Selective (11/28/2016); IR Angiogram Selective Each Additional Vessel (11/28/2016); IR US Guide Vasc Access Right (11/28/2016); IR Angiogram Selective Each Additional Vessel (11/28/2016); IR Angiogram Visceral Selective (11/28/2016); IR Angiogram Selective Each Additional Vessel (11/28/2016); IR EMBO ARTERIAL NOT HEMORR HEMANG INC GUIDE ROADMAPPING (11/28/2016); IR Angiogram Selective Each Additional Vessel (11/28/2016); IR Angiogram Selective Each Additional Vessel (12/13/2016); IR Angiogram Visceral Selective (12/13/2016); IR Angiogram Selective Each Additional Vessel (12/13/2016); IR US Guide Vasc Access Right (12/13/2016); IR Angiogram Visceral Selective (12/13/2016); IR EMBO TUMOR ORGAN ISCHEMIA INFARCT INC GUIDE ROADMAPPING (12/13/2016); IR Angiogram Selective Each Additional Vessel (01/09/2017); IR EMBO TUMOR ORGAN ISCHEMIA INFARCT INC GUIDE ROADMAPPING (01/09/2017); IR US Guide Vasc Access  Right (01/09/2017); IR Angiogram Visceral  Selective (01/09/2017); IR Radiologist Eval & Mgmt (11/06/2016); IR Radiologist Eval & Mgmt (01/02/2017); IR IVC FILTER PLMT / S&I /IMG GUID/MOD SED (04/24/2017); IR Radiologist Eval & Mgmt (02/05/2017); and IR Radiologist Eval & Mgmt (05/02/2017).   Her family history includes Coronary artery disease in her brother; Diabetes in her brother; Heart attack (age of onset: 68) in her father; Prostate cancer in her brother and son.She reports that she quit smoking about 42 years ago. Her smoking use included cigarettes. She has a 2.50 pack-year smoking history. she has never used smokeless tobacco. She reports that she does not drink alcohol or use drugs.    ROS Review of Systems  Constitutional: Negative for chills, diaphoresis and fever.  HENT: Negative for congestion, rhinorrhea and sore throat.   Eyes: Negative for visual disturbance.  Respiratory: Positive for chest tightness and shortness of breath. Negative for cough and wheezing.   Cardiovascular: Positive for palpitations.  Gastrointestinal: Negative for abdominal pain, diarrhea and nausea.  Genitourinary: Negative for dysuria.  Musculoskeletal: Positive for arthralgias and myalgias.  Neurological: Negative for speech difficulty and numbness.  Psychiatric/Behavioral: Positive for agitation, decreased concentration and sleep disturbance. Negative for confusion. The patient is nervous/anxious.     Objective:  BP 111/64   Pulse (!) 56   Temp (!) 96.7 F (35.9 C) (Oral)   Ht 5\' 4"  (1.626 m)   Wt 229 lb 4 oz (104 kg)   SpO2 98%   BMI 39.35 kg/m   BP Readings from Last 3 Encounters:  08/22/17 111/64  08/13/17 (!) 125/59  08/08/17 133/77    Wt Readings from Last 3 Encounters:  08/22/17 229 lb 4 oz (104 kg)  08/13/17 226 lb 11.2 oz (102.8 kg)  08/08/17 230 lb (104.3 kg)     Physical Exam  Constitutional: She is oriented to person, place, and time. She appears well-developed and  well-nourished. No distress.  HENT:  Head: Normocephalic and atraumatic.  Right Ear: External ear normal.  Left Ear: External ear normal.  Nose: Nose normal.  Mouth/Throat: Oropharynx is clear and moist.  Eyes: Conjunctivae and EOM are normal. Pupils are equal, round, and reactive to light.  Neck: Normal range of motion. Neck supple. No thyromegaly present.  Cardiovascular: Normal rate, regular rhythm and normal heart sounds.  No murmur heard. Pulmonary/Chest: Effort normal and breath sounds normal. No respiratory distress. She has no wheezes. She has no rales.  Abdominal: Soft. Bowel sounds are normal. She exhibits no distension. There is no tenderness.  Musculoskeletal: She exhibits no edema or tenderness.  Wheelchair-bound   Lymphadenopathy:    She has no cervical adenopathy.  Neurological: She is alert and oriented to person, place, and time. She has normal reflexes. No cranial nerve deficit. She exhibits abnormal muscle tone.  Skin: Skin is warm and dry.  Psychiatric: She has a normal mood and affect. Her behavior is normal.   EKG shows a bradycardia with a regular rhythm and no ischemic changes.  Chest x-ray shows poor inspiration some elevation of the left diaphragm.  Cardiomegaly. Assessment & Plan:   [name] was seen today for shortness of breath.  Diagnoses and all orders for this visit:  Paroxysmal atrial fibrillation ([place])  Other acute pulmonary embolism without acute cor pulmonale ([name])  Chest pain, unspecified type       I am having [place]. Rumberger maintain her Melatonin, hydroxypropyl methylcellulose / hypromellose, acetaminophen, Vitamin D (Ergocalciferol), temozolomide, rivaroxaban, capecitabine, losartan, amitriptyline, escitalopram, fluticasone, gabapentin, levothyroxine, lovastatin, omeprazole, ondansetron, furosemide, and  oxyCODONE-acetaminophen.  Allergies as of 08/22/2017      Reactions   Lisinopril Cough      Medication List        Accurate  as of 08/22/17  4:19 PM. Always use your most recent med list.          acetaminophen 500 MG tablet Commonly known as:  TYLENOL Take 1,000 mg by mouth every 6 (six) hours as needed for mild pain or headache.   amitriptyline 25 MG tablet Commonly known as:  ELAVIL Take 1 tablet (25 mg total) by mouth at bedtime.   capecitabine 500 MG tablet Commonly known as:  XELODA Take 1,500 mg by mouth 2 (two) times daily after a meal.   escitalopram 20 MG tablet Commonly known as:  LEXAPRO Take 1 tablet (20 mg total) by mouth at bedtime.   fluticasone 50 MCG/ACT nasal spray Commonly known as:  FLONASE Place 2 sprays into both nostrils at bedtime.   furosemide 20 MG tablet Commonly known as:  LASIX Take 1 tablet (20 mg total) by mouth daily as needed.   gabapentin 400 MG capsule Commonly known as:  NEURONTIN Take 1-2 capsules (400-800 mg total) by mouth 3 (three) times daily.   hydroxypropyl methylcellulose / hypromellose 2.5 % ophthalmic solution Commonly known as:  ISOPTO TEARS / GONIOVISC Place 1 drop into both eyes at bedtime.   levothyroxine 75 MCG tablet Commonly known as:  SYNTHROID, LEVOTHROID Take 1 tablet (75 mcg total) by mouth daily before breakfast.   losartan 50 MG tablet Commonly known as:  COZAAR TAKE 1 TABLET DAILY   lovastatin 40 MG tablet Commonly known as:  MEVACOR Take 1 tablet (40 mg total) by mouth at bedtime.   Melatonin 3 MG Tbdp Take 3-6 mg by mouth at bedtime as needed.   omeprazole 40 MG capsule Commonly known as:  PRILOSEC Take 1 capsule (40 mg total) by mouth daily.   ondansetron 8 MG tablet Commonly known as:  ZOFRAN Take 1 tablet (8 mg total) by mouth every 8 (eight) hours as needed for nausea or vomiting.   oxyCODONE-acetaminophen 5-325 MG tablet Commonly known as:  PERCOCET Take 1 tablet by mouth every 8 (eight) hours as needed.   rivaroxaban 20 MG Tabs tablet Commonly known as:  XARELTO Take 1 tablet (20 mg total) daily with supper  by mouth.   temozolomide 140 MG capsule Commonly known as:  TEMODAR TAKE 2 CAPSULES BY MOUTH DAILY. MAY TAKE ON AN EMPTY STOMACH OR AT BEDTIME TO DECREASE NAUSEA AND VOMITING   Vitamin D (Ergocalciferol) 50000 units Caps capsule Commonly known as:  DRISDOL TAKE 1 CAPSULE EVERY 7 DAYS      Urgent consultation with cardiology recommended.  Labs pending.  She is not on a rate control medication.  It appears that she may need rate control.  However her heart rate currently is low enough that that would not be feasible in this setting.  She mentioned that she has been considered for a pacemaker.  Should her rapid and slow heart rates be reminiscent sick sinus syndrome her cardiologist certainly would need to be involved to consider pacemaker.  Apparently that is been held due to her current cancer treatments.  Follow-up: Return in about 1 week (around 08/29/2017).  [name], M.D.

## 2017-08-23 ENCOUNTER — Ambulatory Visit (INDEPENDENT_AMBULATORY_CARE_PROVIDER_SITE_OTHER): Payer: Medicare Other | Admitting: *Deleted

## 2017-08-23 ENCOUNTER — Other Ambulatory Visit: Payer: Self-pay | Admitting: Pediatrics

## 2017-08-23 DIAGNOSIS — R002 Palpitations: Secondary | ICD-10-CM | POA: Diagnosis not present

## 2017-08-23 LAB — CBC WITH DIFFERENTIAL/PLATELET
BASOS ABS: 0 10*3/uL (ref 0.0–0.2)
Basos: 0 %
EOS (ABSOLUTE): 0.1 10*3/uL (ref 0.0–0.4)
Eos: 2 %
Hematocrit: 38.3 % (ref 34.0–46.6)
Hemoglobin: 13.1 g/dL (ref 11.1–15.9)
IMMATURE GRANS (ABS): 0 10*3/uL (ref 0.0–0.1)
IMMATURE GRANULOCYTES: 0 %
LYMPHS: 26 %
Lymphocytes Absolute: 1.3 10*3/uL (ref 0.7–3.1)
MCH: 34.5 pg — ABNORMAL HIGH (ref 26.6–33.0)
MCHC: 34.2 g/dL (ref 31.5–35.7)
MCV: 101 fL — AB (ref 79–97)
MONOS ABS: 0.9 10*3/uL (ref 0.1–0.9)
Monocytes: 17 %
NEUTROS PCT: 55 %
Neutrophils Absolute: 2.7 10*3/uL (ref 1.4–7.0)
PLATELETS: 154 10*3/uL (ref 150–379)
RBC: 3.8 x10E6/uL (ref 3.77–5.28)
RDW: 16.1 % — AB (ref 12.3–15.4)
WBC: 5 10*3/uL (ref 3.4–10.8)

## 2017-08-23 LAB — CMP14+EGFR
A/G RATIO: 1.5 (ref 1.2–2.2)
ALT: 18 IU/L (ref 0–32)
AST: 28 IU/L (ref 0–40)
Albumin: 4 g/dL (ref 3.5–4.7)
Alkaline Phosphatase: 93 IU/L (ref 39–117)
BUN/Creatinine Ratio: 13 (ref 12–28)
BUN: 13 mg/dL (ref 8–27)
Bilirubin Total: 0.4 mg/dL (ref 0.0–1.2)
CALCIUM: 9.8 mg/dL (ref 8.7–10.3)
CHLORIDE: 100 mmol/L (ref 96–106)
CO2: 23 mmol/L (ref 20–29)
Creatinine, Ser: 1.04 mg/dL — ABNORMAL HIGH (ref 0.57–1.00)
GFR calc Af Amer: 58 mL/min/{1.73_m2} — ABNORMAL LOW (ref >59)
GFR, EST NON AFRICAN AMERICAN: 51 mL/min/{1.73_m2} — AB (ref >59)
Globulin, Total: 2.6 g/dL (ref 1.5–4.5)
Glucose: 123 mg/dL — ABNORMAL HIGH (ref 65–99)
POTASSIUM: 5.5 mmol/L — AB (ref 3.5–5.2)
Sodium: 137 mmol/L (ref 134–144)
TOTAL PROTEIN: 6.6 g/dL (ref 6.0–8.5)

## 2017-08-23 LAB — D-DIMER, QUANTITATIVE: D-DIMER: 0.28 mg/L FEU (ref 0.00–0.49)

## 2017-08-26 ENCOUNTER — Telehealth: Payer: Self-pay | Admitting: Pediatrics

## 2017-08-26 NOTE — Progress Notes (Signed)
Carelink Summary Report / Loop Recorder 

## 2017-08-26 NOTE — Telephone Encounter (Signed)
Per labwork patient is to take losaratan 1/2 daily. Explained to patient and she verbalized understanding.

## 2017-08-28 ENCOUNTER — Other Ambulatory Visit: Payer: Self-pay | Admitting: Internal Medicine

## 2017-08-28 DIAGNOSIS — I2699 Other pulmonary embolism without acute cor pulmonale: Secondary | ICD-10-CM

## 2017-09-02 ENCOUNTER — Other Ambulatory Visit: Payer: Self-pay | Admitting: Family Medicine

## 2017-09-03 LAB — CUP PACEART REMOTE DEVICE CHECK
Date Time Interrogation Session: 20190125233931
MDC IDC PG IMPLANT DT: 20160609

## 2017-09-06 ENCOUNTER — Other Ambulatory Visit: Payer: Self-pay | Admitting: Internal Medicine

## 2017-09-06 DIAGNOSIS — C7A8 Other malignant neuroendocrine tumors: Secondary | ICD-10-CM

## 2017-09-06 DIAGNOSIS — Z5111 Encounter for antineoplastic chemotherapy: Secondary | ICD-10-CM

## 2017-09-06 DIAGNOSIS — C787 Secondary malignant neoplasm of liver and intrahepatic bile duct: Secondary | ICD-10-CM

## 2017-09-06 DIAGNOSIS — I10 Essential (primary) hypertension: Secondary | ICD-10-CM

## 2017-09-11 ENCOUNTER — Encounter: Payer: Self-pay | Admitting: Internal Medicine

## 2017-09-11 ENCOUNTER — Encounter (INDEPENDENT_AMBULATORY_CARE_PROVIDER_SITE_OTHER): Payer: Self-pay

## 2017-09-11 ENCOUNTER — Ambulatory Visit (INDEPENDENT_AMBULATORY_CARE_PROVIDER_SITE_OTHER): Payer: Medicare Other | Admitting: Internal Medicine

## 2017-09-11 VITALS — BP 132/66 | HR 72 | Ht 64.0 in | Wt 229.0 lb

## 2017-09-11 DIAGNOSIS — R001 Bradycardia, unspecified: Secondary | ICD-10-CM | POA: Diagnosis not present

## 2017-09-11 DIAGNOSIS — R002 Palpitations: Secondary | ICD-10-CM | POA: Diagnosis not present

## 2017-09-11 DIAGNOSIS — I480 Paroxysmal atrial fibrillation: Secondary | ICD-10-CM | POA: Diagnosis not present

## 2017-09-11 LAB — CUP PACEART INCLINIC DEVICE CHECK
Date Time Interrogation Session: 20190213160928
MDC IDC PG IMPLANT DT: 20160609

## 2017-09-11 NOTE — Patient Instructions (Signed)
Medication Instructions:  Your physician recommends that you continue on your current medications as directed. Please refer to the Current Medication list given to you today.  * If you need a refill on your cardiac medications before your next appointment, please call your pharmacy. *  Labwork: None ordered  Testing/Procedures: None ordered  Follow-Up: Keep your follow up on 2/22 with Dr. Gwenlyn Found.  Continue with monthly LINQ monitoring.   Thank you for choosing [facility]!!

## 2017-09-11 NOTE — Progress Notes (Signed)
PCP: Eustaquio Maize, MD Primary Cardiologist: Dr Gwenlyn Found Primary EP: Dr Rayann Heman  [name] is a 84 y.o. female who presents today for routine electrophysiology followup.  Since last being seen in our clinic, the patient reports doing reasonably well.  She is chronically weak and dizzy.  She frequently feels that she is going to "pass out".  Despite these symptoms, she does not have arrhythmias or any bradycardias to correspond to episodes.  Today, she denies symptoms of palpitations, chest pain, shortness of breath, or  lower extremity edema.  The patient is otherwise without complaint today.   Past Medical History:  Diagnosis Date  . Acute respiratory failure with hypoxia ([place]) 10/23/2015  . Allergic rhinitis    PT. DENIES  . Anxiety   . Arthritis    NECK  . Ataxia   . Bradycardia    primarily nocturnal  . Burning tongue syndrome 25 years  . Cancer ([place]) dx'd 06/2016   liver  . Cataract   . Cerebellar degeneration   . Chronic pericarditis   . Chronic urinary tract infection   . Complication of anesthesia    low o2 sats, coded 30 years ago  . CVA (cerebral infarction) 05/2003  . Depression   . Encounter for antineoplastic chemotherapy 10/09/2016  . Gait disorder   . Gastric polyps   . GERD (gastroesophageal reflux disease)   . Goals of care, counseling/discussion 10/09/2016  . High cholesterol   . Hyperlipidemia   . Hypertension   . Hypertension   . Hypotension   . Hypothyroidism   . IBS (irritable bowel syndrome)   . Obesity   . Paroxysmal atrial fibrillation ([name])    chads2vasc score is 6,  she is felt to be a poor candidate for anticoagulation  . Personal history of arterial venous malformation (AVM)    right side of face  . Seizure disorder ([place])   . Seizures ([place]) 2003   " smelling"- Gabapentin "no problem"  . Shortness of breath dyspnea    with exertion  . Sternum fx 10/27/2013  . Stroke [facility]) 5 years ago   Right side of face weak, slurred speach-   .  Thyroid disease   . TIA (transient ischemic attack)   . UTI (lower urinary tract infection) 03/27/2016   "frequently"   Past Surgical History:  Procedure Laterality Date  . APPENDECTOMY  84 years old  . COLONOSCOPY  2006, 2009  . COLONOSCOPY WITH PROPOFOL N/A 03/28/2016   Procedure: COLONOSCOPY WITH PROPOFOL;  Surgeon: Gatha Mayer, MD;  Location: [place];  Service: Endoscopy;  Laterality: N/A;  . cyst removed  35 years ago  . EP IMPLANTABLE DEVICE N/A 01/06/2015   Procedure: Loop Recorder Insertion;  Surgeon: Thompson Grayer, MD;  Location: [place] [facility];  Service: Cardiovascular;  Laterality: N/A;  . EYE SURGERY Right    Cataract  . IR ANGIOGRAM SELECTIVE EACH ADDITIONAL VESSEL  11/28/2016  . IR ANGIOGRAM SELECTIVE EACH ADDITIONAL VESSEL  11/28/2016  . IR ANGIOGRAM SELECTIVE EACH ADDITIONAL VESSEL  11/28/2016  . IR ANGIOGRAM SELECTIVE EACH ADDITIONAL VESSEL  11/28/2016  . IR ANGIOGRAM SELECTIVE EACH ADDITIONAL VESSEL  12/13/2016  . IR ANGIOGRAM SELECTIVE EACH ADDITIONAL VESSEL  12/13/2016  . IR ANGIOGRAM SELECTIVE EACH ADDITIONAL VESSEL  01/09/2017  . IR ANGIOGRAM VISCERAL SELECTIVE  11/28/2016  . IR ANGIOGRAM VISCERAL SELECTIVE  11/28/2016  . IR ANGIOGRAM VISCERAL SELECTIVE  12/13/2016  . IR ANGIOGRAM VISCERAL SELECTIVE  12/13/2016  . IR ANGIOGRAM  VISCERAL SELECTIVE  01/09/2017  . IR EMBO ARTERIAL NOT HEMORR HEMANG INC GUIDE ROADMAPPING  11/28/2016  . IR EMBO TUMOR ORGAN ISCHEMIA INFARCT INC GUIDE ROADMAPPING  12/13/2016  . IR EMBO TUMOR ORGAN ISCHEMIA INFARCT INC GUIDE ROADMAPPING  01/09/2017  . IR IVC FILTER PLMT / S&I /IMG GUID/MOD SED  04/24/2017  . IR RADIOLOGIST EVAL & MGMT  11/06/2016  . IR RADIOLOGIST EVAL & MGMT  01/02/2017  . IR RADIOLOGIST EVAL & MGMT  02/05/2017  . IR RADIOLOGIST EVAL & MGMT  05/02/2017  . IR US GUIDE VASC ACCESS RIGHT  11/28/2016  . IR US GUIDE VASC ACCESS RIGHT  12/13/2016  . IR US GUIDE VASC ACCESS RIGHT  01/09/2017  . KNEE ARTHROSCOPY Right 11/14/2006  . KNEE  ARTHROSCOPY Bilateral 5 and 6 years ago  . KNEE ARTHROSCOPY WITH LATERAL MENISECTOMY  07/03/2012   Procedure: KNEE ARTHROSCOPY WITH LATERAL MENISECTOMY;  Surgeon: Magnus Sinning, MD;  Location: [facility];  Service: Orthopedics;  Laterality: Left;  with Partial Lateral Menisectomy and Medial Menisectomy. Shaving of medial and lateral femoral condyles. Shaving of patella. Removal of a loose body  . tibial and fibular internal fixation Left   . TOTAL ABDOMINAL HYSTERECTOMY  84 years old  . UPPER GASTROINTESTINAL ENDOSCOPY  2009, 2013    ROS- all systems are reviewed and negatives except as per HPI above  Current Outpatient Medications  Medication Sig Dispense Refill  . acetaminophen (TYLENOL) 500 MG tablet Take 1,000 mg by mouth every 6 (six) hours as needed for mild pain or headache.     Marland Kitchen amitriptyline (ELAVIL) 25 MG tablet Take 1 tablet (25 mg total) by mouth at bedtime. 90 tablet 1  . capecitabine (XELODA) 500 MG tablet TAKE 3 TABLETS (1,500 MG TOTAL) BY MOUTH 2 (TWO) TIMES DAILY AFTER A MEAL. 84 tablet 2  . escitalopram (LEXAPRO) 20 MG tablet Take 1 tablet (20 mg total) by mouth at bedtime. 90 tablet 1  . fluticasone (FLONASE) 50 MCG/ACT nasal spray Place 2 sprays into both nostrils at bedtime. 16 g 3  . furosemide (LASIX) 20 MG tablet Take 1 tablet (20 mg total) by mouth daily as needed. 90 tablet 0  . gabapentin (NEURONTIN) 400 MG capsule Take 1-2 capsules (400-800 mg total) by mouth 3 (three) times daily. 720 capsule 1  . hydroxypropyl methylcellulose / hypromellose (ISOPTO TEARS / GONIOVISC) 2.5 % ophthalmic solution Place 1 drop into both eyes at bedtime.    Marland Kitchen levothyroxine (SYNTHROID, LEVOTHROID) 75 MCG tablet Take 1 tablet (75 mcg total) by mouth daily before breakfast. 90 tablet 1  . losartan (COZAAR) 50 MG tablet TAKE 1 TABLET DAILY 90 tablet 1  . lovastatin (MEVACOR) 40 MG tablet Take 1 tablet (40 mg total) by mouth at bedtime. 90 tablet 1  . Melatonin 3 MG TBDP Take 3-6 mg by mouth  at bedtime as needed. 60 tablet 1  . omeprazole (PRILOSEC) 40 MG capsule Take 1 capsule (40 mg total) by mouth daily. 90 capsule 1  . ondansetron (ZOFRAN) 8 MG tablet Take 1 tablet (8 mg total) by mouth every 8 (eight) hours as needed for nausea or vomiting. 90 tablet 1  . oxyCODONE-acetaminophen (PERCOCET) 5-325 MG tablet Take 1 tablet by mouth every 8 (eight) hours as needed. 30 tablet 0  . temozolomide (TEMODAR) 140 MG capsule TAKE 2 CAPSULES BY MOUTH DAILY. MAY TAKE ON AN EMPTY STOMACH OR AT BEDTIME TO DECREASE NAUSEA AND VOMITING 10 capsule 2  . Vitamin D, Ergocalciferol, (  DRISDOL) 50000 units CAPS capsule TAKE 1 CAPSULE EVERY 7 DAYS 12 capsule 0  . XARELTO 20 MG TABS tablet TAKE 1 TABLET DAILY WITH SUPPER 90 tablet 0   No current facility-administered medications for this visit.     Physical Exam: Vitals:   09/11/17 1508  BP: 132/66  Pulse: 72  Weight: 229 lb (103.9 kg)  Height: 5\' 4"  (1.626 m)    GEN- The patient is chronically ill and frail appearing, alert and oriented x 3 today.  In a wheelchair today Head- normocephalic, atraumatic Eyes-  Sclera clear, conjunctiva pink Ears- hearing intact Oropharynx- clear Lungs- Clear to ausculation bilaterally, normal work of breathing Heart- Regular rate and rhythm, no murmurs, rubs or gallops, PMI not laterally displaced GI- soft, NT, ND, + BS Extremities- no clubbing, cyanosis, or edema  ILR interrogation is reviewed at length.    Assessment and Plan:  1. Dizziness, presyncope, fat igue Unclear etiology She does not have any arrhythmias or bradycardias to correspond to her frequent symptoms.  Her heart rates are above 60 bpm 99.9 % of the time and her symptoms occur every day.  I have informed her today that PPM would not correct her symptoms.  She did have a single sinus pause previously for which she was suprisingly asymptomatic.  I do not think that PPM would improve her quality of life currently  2. Paroxysmal atrial  fibrillation Very low AF burden ( <0.1%) On xarelto  3. HTN Stable No change required today  4. Lung CA Followed by Dr Tiburcio Pea Follow-up with Dr Gwenlyn Found as scheduled I will see as needed going forward  Thompson Grayer MD, [facility] 09/11/2017 3:14 PM

## 2017-09-12 ENCOUNTER — Encounter: Payer: Self-pay | Admitting: Oncology

## 2017-09-12 ENCOUNTER — Telehealth: Payer: Self-pay | Admitting: Oncology

## 2017-09-12 ENCOUNTER — Inpatient Hospital Stay: Payer: Medicare Other | Attending: Internal Medicine | Admitting: Oncology

## 2017-09-12 ENCOUNTER — Inpatient Hospital Stay: Payer: Medicare Other

## 2017-09-12 VITALS — BP 136/60 | HR 72 | Temp 97.8°F | Resp 20 | Ht 64.0 in | Wt 230.1 lb

## 2017-09-12 DIAGNOSIS — I480 Paroxysmal atrial fibrillation: Secondary | ICD-10-CM | POA: Diagnosis not present

## 2017-09-12 DIAGNOSIS — E669 Obesity, unspecified: Secondary | ICD-10-CM | POA: Diagnosis not present

## 2017-09-12 DIAGNOSIS — F419 Anxiety disorder, unspecified: Secondary | ICD-10-CM | POA: Insufficient documentation

## 2017-09-12 DIAGNOSIS — Z86711 Personal history of pulmonary embolism: Secondary | ICD-10-CM | POA: Diagnosis not present

## 2017-09-12 DIAGNOSIS — I10 Essential (primary) hypertension: Secondary | ICD-10-CM | POA: Insufficient documentation

## 2017-09-12 DIAGNOSIS — C7B8 Other secondary neuroendocrine tumors: Secondary | ICD-10-CM | POA: Diagnosis not present

## 2017-09-12 DIAGNOSIS — G319 Degenerative disease of nervous system, unspecified: Secondary | ICD-10-CM | POA: Diagnosis not present

## 2017-09-12 DIAGNOSIS — E039 Hypothyroidism, unspecified: Secondary | ICD-10-CM | POA: Insufficient documentation

## 2017-09-12 DIAGNOSIS — F329 Major depressive disorder, single episode, unspecified: Secondary | ICD-10-CM | POA: Diagnosis not present

## 2017-09-12 DIAGNOSIS — Z7901 Long term (current) use of anticoagulants: Secondary | ICD-10-CM | POA: Insufficient documentation

## 2017-09-12 DIAGNOSIS — K219 Gastro-esophageal reflux disease without esophagitis: Secondary | ICD-10-CM | POA: Diagnosis not present

## 2017-09-12 DIAGNOSIS — Z79899 Other long term (current) drug therapy: Secondary | ICD-10-CM | POA: Insufficient documentation

## 2017-09-12 DIAGNOSIS — Z8673 Personal history of transient ischemic attack (TIA), and cerebral infarction without residual deficits: Secondary | ICD-10-CM | POA: Insufficient documentation

## 2017-09-12 DIAGNOSIS — C7B02 Secondary carcinoid tumors of liver: Secondary | ICD-10-CM | POA: Insufficient documentation

## 2017-09-12 DIAGNOSIS — E7800 Pure hypercholesterolemia, unspecified: Secondary | ICD-10-CM | POA: Diagnosis not present

## 2017-09-12 DIAGNOSIS — K589 Irritable bowel syndrome without diarrhea: Secondary | ICD-10-CM | POA: Insufficient documentation

## 2017-09-12 DIAGNOSIS — C7A8 Other malignant neuroendocrine tumors: Secondary | ICD-10-CM | POA: Insufficient documentation

## 2017-09-12 DIAGNOSIS — Z5111 Encounter for antineoplastic chemotherapy: Secondary | ICD-10-CM

## 2017-09-12 LAB — CBC WITH DIFFERENTIAL (CANCER CENTER ONLY)
BASOS ABS: 0 10*3/uL (ref 0.0–0.1)
BASOS PCT: 1 %
Eosinophils Absolute: 0.2 10*3/uL (ref 0.0–0.5)
Eosinophils Relative: 6 %
HEMATOCRIT: 37.3 % (ref 34.8–46.6)
HEMOGLOBIN: 12.4 g/dL (ref 11.6–15.9)
Lymphocytes Relative: 25 %
Lymphs Abs: 0.9 10*3/uL (ref 0.9–3.3)
MCH: 34.7 pg — ABNORMAL HIGH (ref 25.1–34.0)
MCHC: 33.2 g/dL (ref 31.5–36.0)
MCV: 104.5 fL — ABNORMAL HIGH (ref 79.5–101.0)
Monocytes Absolute: 0.6 10*3/uL (ref 0.1–0.9)
Monocytes Relative: 17 %
NEUTROS ABS: 2 10*3/uL (ref 1.5–6.5)
NEUTROS PCT: 51 %
Platelet Count: 100 10*3/uL — ABNORMAL LOW (ref 145–400)
RBC: 3.57 MIL/uL — ABNORMAL LOW (ref 3.70–5.45)
RDW: 16.2 % — ABNORMAL HIGH (ref 11.2–14.5)
WBC Count: 3.8 10*3/uL — ABNORMAL LOW (ref 3.9–10.3)

## 2017-09-12 LAB — CMP (CANCER CENTER ONLY)
ALBUMIN: 3.6 g/dL (ref 3.5–5.0)
ALT: 17 U/L (ref 0–55)
AST: 28 U/L (ref 5–34)
Alkaline Phosphatase: 93 U/L (ref 40–150)
Anion gap: 8 (ref 3–11)
BILIRUBIN TOTAL: 0.7 mg/dL (ref 0.2–1.2)
BUN: 16 mg/dL (ref 7–26)
CO2: 26 mmol/L (ref 22–29)
Calcium: 9.7 mg/dL (ref 8.4–10.4)
Chloride: 103 mmol/L (ref 98–109)
Creatinine: 0.9 mg/dL (ref 0.60–1.10)
GFR, EST NON AFRICAN AMERICAN: 58 mL/min — AB (ref >60)
GFR, Est AFR Am: >60 mL/min (ref >60)
Glucose, Bld: 99 mg/dL (ref 70–140)
Potassium: 4.9 mmol/L (ref 3.5–5.1)
Sodium: 137 mmol/L (ref 136–145)
TOTAL PROTEIN: 6.7 g/dL (ref 6.4–8.3)

## 2017-09-12 MED FILL — TEMOZOLOMIDE 140 MG CAPSULE: 140 | 28 days supply | Qty: 10 | Fill #0

## 2017-09-12 MED FILL — CAPECITABINE 500 MG TABLET: 500 | 14 days supply | Qty: 84 | Fill #0

## 2017-09-12 NOTE — Telephone Encounter (Signed)
Gave patient avs and calendar with appts per 2/14 los.

## 2017-09-12 NOTE — Progress Notes (Signed)
[place] OFFICE PROGRESS NOTE  [name], [place] [address]  DIAGNOSIS:  1) Metastatic intermediate. Neuroendocrine tumor of lung primary diagnosed in January 2018 and presented with small bilateral pulmonary nodules in addition to multiple liver metastasis. 2) right lower lobe pulmonary embolism diagnosed incidentally on CT scan of the chest on 04/23/2017  PRIOR THERAPY: 1) Status post radio embolization with Y 90 to the liver lesions by interventional radiology. 2) status post IVC filter placement by interventional radiology on 04/24/2017  CURRENT THERAPY: Xeloda 750 MG/M2 twice a day days 1-14 and Temodar 150 MG/M2 days 10-14 every 4 weeks. Status post 13 cycles. She will start cycle #15 on September 15, 2017.  INTERVAL HISTORY: [name] 84 y.o. female returns for routine follow-up visit accompanied by her husband.  The patient is feeling fine today with no specific complaints except for arthritis in her knees.  She is asking if it is okay to take Aleve.  She has not had the improvement in her pain with the injections in the past.  She has Percocet at home which she has not been using that she has severe pain.  She reports having one episode of acute shortness of breath during the middle the night since her last visit.  She is not evaluated at the time of shortness of breath. She has seen her cardiologist in the interim and the cause for her shortness of breath has not been identified.  This has now resolved.  The patient denies fevers and chills.  Denies chest pain, shortness breath, cough, hemoptysis.  She has not had any recent weight loss or night sweats.  She denies nausea, vomiting, constipation, diarrhea.  The patient is here for evaluation or repeat lab work.  MEDICAL HISTORY: Past Medical History:  Diagnosis Date  . Acute respiratory failure with hypoxia ([place]) 10/23/2015  . Allergic rhinitis    PT. DENIES  . Anxiety   . Arthritis     NECK  . Ataxia   . Bradycardia    primarily nocturnal  . Burning tongue syndrome 25 years  . Cancer ([place]) dx'd 06/2016   liver  . Cataract   . Cerebellar degeneration   . Chronic pericarditis   . Chronic urinary tract infection   . Complication of anesthesia    low o2 sats, coded 30 years ago  . CVA (cerebral infarction) 05/2003  . Depression   . Encounter for antineoplastic chemotherapy 10/09/2016  . Gait disorder   . Gastric polyps   . GERD (gastroesophageal reflux disease)   . Goals of care, counseling/discussion 10/09/2016  . High cholesterol   . Hyperlipidemia   . Hypertension   . Hypertension   . Hypotension   . Hypothyroidism   . IBS (irritable bowel syndrome)   . Obesity   . Paroxysmal atrial fibrillation ([name])    chads2vasc score is 6,  she is felt to be a poor candidate for anticoagulation  . Personal history of arterial venous malformation (AVM)    right side of face  . Seizure disorder ([place])   . Seizures ([place]) 2003   " smelling"- Gabapentin "no problem"  . Shortness of breath dyspnea    with exertion  . Sternum fx 10/27/2013  . Stroke [facility]) 5 years ago   Right side of face weak, slurred speach-   . Thyroid disease   . TIA (transient ischemic attack)   . UTI (lower urinary tract infection) 03/27/2016   "frequently"  ALLERGIES:  is allergic to lisinopril.  MEDICATIONS:  Current Outpatient Medications  Medication Sig Dispense Refill  . amitriptyline (ELAVIL) 25 MG tablet Take 1 tablet (25 mg total) by mouth at bedtime. 90 tablet 1  . capecitabine (XELODA) 500 MG tablet TAKE 3 TABLETS (1,500 MG TOTAL) BY MOUTH 2 (TWO) TIMES DAILY AFTER A MEAL. 84 tablet 2  . escitalopram (LEXAPRO) 20 MG tablet Take 1 tablet (20 mg total) by mouth at bedtime. 90 tablet 1  . fluticasone (FLONASE) 50 MCG/ACT nasal spray Place 2 sprays into both nostrils at bedtime. 16 g 3  . furosemide (LASIX) 20 MG tablet Take 1 tablet (20 mg total) by mouth daily as needed. 90 tablet 0   . gabapentin (NEURONTIN) 400 MG capsule Take 1-2 capsules (400-800 mg total) by mouth 3 (three) times daily. 720 capsule 1  . hydroxypropyl methylcellulose / hypromellose (ISOPTO TEARS / GONIOVISC) 2.5 % ophthalmic solution Place 1 drop into both eyes at bedtime.    Marland Kitchen levothyroxine (SYNTHROID, LEVOTHROID) 75 MCG tablet Take 1 tablet (75 mcg total) by mouth daily before breakfast. 90 tablet 1  . losartan (COZAAR) 50 MG tablet TAKE 1 TABLET DAILY 90 tablet 1  . lovastatin (MEVACOR) 40 MG tablet Take 1 tablet (40 mg total) by mouth at bedtime. 90 tablet 1  . Melatonin 3 MG TBDP Take 3-6 mg by mouth at bedtime as needed. 60 tablet 1  . omeprazole (PRILOSEC) 40 MG capsule Take 1 capsule (40 mg total) by mouth daily. 90 capsule 1  . ondansetron (ZOFRAN) 8 MG tablet Take 1 tablet (8 mg total) by mouth every 8 (eight) hours as needed for nausea or vomiting. 90 tablet 1  . temozolomide (TEMODAR) 140 MG capsule TAKE 2 CAPSULES BY MOUTH DAILY. MAY TAKE ON AN EMPTY STOMACH OR AT BEDTIME TO DECREASE NAUSEA AND VOMITING 10 capsule 2  . Vitamin D, Ergocalciferol, (DRISDOL) 50000 units CAPS capsule TAKE 1 CAPSULE EVERY 7 DAYS 12 capsule 0  . XARELTO 20 MG TABS tablet TAKE 1 TABLET DAILY WITH SUPPER 90 tablet 0  . oxyCODONE-acetaminophen (PERCOCET) 5-325 MG tablet Take 1 tablet by mouth every 8 (eight) hours as needed. (Patient not taking: Reported on 09/12/2017) 30 tablet 0   No current facility-administered medications for this visit.     SURGICAL HISTORY:  Past Surgical History:  Procedure Laterality Date  . APPENDECTOMY  84 years old  . COLONOSCOPY  2006, 2009  . COLONOSCOPY WITH PROPOFOL N/A 03/28/2016   Procedure: COLONOSCOPY WITH PROPOFOL;  Surgeon: Gatha Mayer, MD;  Location: [place];  Service: Endoscopy;  Laterality: N/A;  . cyst removed  35 years ago  . EP IMPLANTABLE DEVICE N/A 01/06/2015   Procedure: Loop Recorder Insertion;  Surgeon: Thompson Grayer, MD;  Location: [place] [facility];   Service: Cardiovascular;  Laterality: N/A;  . EYE SURGERY Right    Cataract  . IR ANGIOGRAM SELECTIVE EACH ADDITIONAL VESSEL  11/28/2016  . IR ANGIOGRAM SELECTIVE EACH ADDITIONAL VESSEL  11/28/2016  . IR ANGIOGRAM SELECTIVE EACH ADDITIONAL VESSEL  11/28/2016  . IR ANGIOGRAM SELECTIVE EACH ADDITIONAL VESSEL  11/28/2016  . IR ANGIOGRAM SELECTIVE EACH ADDITIONAL VESSEL  12/13/2016  . IR ANGIOGRAM SELECTIVE EACH ADDITIONAL VESSEL  12/13/2016  . IR ANGIOGRAM SELECTIVE EACH ADDITIONAL VESSEL  01/09/2017  . IR ANGIOGRAM VISCERAL SELECTIVE  11/28/2016  . IR ANGIOGRAM VISCERAL SELECTIVE  11/28/2016  . IR ANGIOGRAM VISCERAL SELECTIVE  12/13/2016  . IR ANGIOGRAM VISCERAL SELECTIVE  12/13/2016  . IR  ANGIOGRAM VISCERAL SELECTIVE  01/09/2017  . IR EMBO ARTERIAL NOT HEMORR HEMANG INC GUIDE ROADMAPPING  11/28/2016  . IR EMBO TUMOR ORGAN ISCHEMIA INFARCT INC GUIDE ROADMAPPING  12/13/2016  . IR EMBO TUMOR ORGAN ISCHEMIA INFARCT INC GUIDE ROADMAPPING  01/09/2017  . IR IVC FILTER PLMT / S&I /IMG GUID/MOD SED  04/24/2017  . IR RADIOLOGIST EVAL & MGMT  11/06/2016  . IR RADIOLOGIST EVAL & MGMT  01/02/2017  . IR RADIOLOGIST EVAL & MGMT  02/05/2017  . IR RADIOLOGIST EVAL & MGMT  05/02/2017  . IR US GUIDE VASC ACCESS RIGHT  11/28/2016  . IR US GUIDE VASC ACCESS RIGHT  12/13/2016  . IR US GUIDE VASC ACCESS RIGHT  01/09/2017  . KNEE ARTHROSCOPY Right 11/14/2006  . KNEE ARTHROSCOPY Bilateral 5 and 6 years ago  . KNEE ARTHROSCOPY WITH LATERAL MENISECTOMY  07/03/2012   Procedure: KNEE ARTHROSCOPY WITH LATERAL MENISECTOMY;  Surgeon: Magnus Sinning, MD;  Location[facility];  Service: Orthopedics;  Laterality: Left;  with Partial Lateral Menisectomy and Medial Menisectomy. Shaving of medial and lateral femoral condyles. Shaving of patella. Removal of a loose body  . tibial and fibular internal fixation Left   . TOTAL ABDOMINAL HYSTERECTOMY  84 years old  . UPPER GASTROINTESTINAL ENDOSCOPY  2009, 2013    REVIEW OF SYSTEMS:   Review of Systems   Constitutional: Negative for appetite change, chills, fatigue, fever and unexpected weight change.  HENT:   Negative for mouth sores, nosebleeds, sore throat and trouble swallowing.   Eyes: Negative for eye problems and icterus.  Respiratory: Negative for cough, hemoptysis, shortness of breath and wheezing.   Cardiovascular: Negative for chest pain and leg swelling.  Gastrointestinal: Negative for abdominal pain, constipation, diarrhea, nausea and vomiting.  Genitourinary: Negative for bladder incontinence, difficulty urinating, dysuria, frequency and hematuria.   Musculoskeletal: Negative for back pain, neck pain and neck stiffness. Positive for knee pain. Skin: Negative for itching and rash.  Neurological: Negative for dizziness, extremity weakness, gait problem, headaches, light-headedness and seizures.  Hematological: Negative for adenopathy. Does not bruise/bleed easily.  Psychiatric/Behavioral: Negative for confusion, depression and sleep disturbance. The patient is not nervous/anxious.     PHYSICAL EXAMINATION:  Blood pressure 136/60, pulse 72, temperature 97.8 F (36.6 C), temperature source Oral, resp. rate 20, height 5\' 4"  (1.626 m), weight 230 lb 1.6 oz (104.4 kg), SpO2 96 %.  ECOG PERFORMANCE STATUS: 1 - Symptomatic but completely ambulatory  Physical Exam  Constitutional: Oriented to person, place, and time and well-developed, well-nourished, and in no distress. No distress.  HENT:  Head: Normocephalic and atraumatic.  Mouth/Throat: Oropharynx is clear and moist. No oropharyngeal exudate.  Eyes: Conjunctivae are normal. Right eye exhibits no discharge. Left eye exhibits no discharge. No scleral icterus.  Neck: Normal range of motion. Neck supple.  Cardiovascular: Normal rate, regular rhythm, normal heart sounds and intact distal pulses.   Pulmonary/Chest: Effort normal and breath sounds normal. No respiratory distress. No wheezes. No rales.  Abdominal: Soft. Bowel sounds  are normal. Exhibits no distension and no mass. There is no tenderness.  Musculoskeletal: Normal range of motion. Exhibits no edema.  Lymphadenopathy:    No cervical adenopathy.  Neurological: Alert and oriented to person, place, and time. Exhibits normal muscle tone. Coordination normal.  Skin: Skin is warm and dry. No rash noted. Not diaphoretic. No erythema. No pallor.  Psychiatric: Mood, memory and judgment normal.  Vitals reviewed.  LABORATORY DATA: Lab Results  Component Value Date   WBC  3.8 (L) 09/12/2017   HGB 13.1 08/22/2017   HCT 37.3 09/12/2017   MCV 104.5 (H) 09/12/2017   PLT 100 (L) 09/12/2017      Chemistry      Component Value Date/Time   NA 137 09/12/2017 1416   NA 137 08/22/2017 1653   NA 135 (L) 07/10/2017 1142   K 4.9 09/12/2017 1416   K 4.6 07/10/2017 1142   CL 103 09/12/2017 1416   CO2 26 09/12/2017 1416   CO2 25 07/10/2017 1142   BUN 16 09/12/2017 1416   BUN 13 08/22/2017 1653   BUN 11.4 07/10/2017 1142   CREATININE 0.90 09/12/2017 1416   CREATININE 1.0 07/10/2017 1142      Component Value Date/Time   CALCIUM 9.7 09/12/2017 1416   CALCIUM 9.9 07/10/2017 1142   ALKPHOS 93 09/12/2017 1416   ALKPHOS 93 07/10/2017 1142   AST 28 09/12/2017 1416   AST 43 (H) 07/10/2017 1142   ALT 17 09/12/2017 1416   ALT 28 07/10/2017 1142   BILITOT 0.7 09/12/2017 1416   BILITOT 0.56 07/10/2017 1142       RADIOGRAPHIC STUDIES:  Dg Chest 2 View  Result Date: 08/23/2017 CLINICAL DATA:  Chest pain EXAM: CHEST  2 VIEW COMPARISON:  01/13/2017 FINDINGS: Cardiomegaly. Calcifications in the aortic arch. Lungs are clear. No effusions. No acute bony abnormality. Loop recorder device over the left chest wall. IMPRESSION: Cardiomegaly.  No active disease. Electronically Signed   By: Rolm Baptise M.D.   On: 08/23/2017 08:19     ASSESSMENT/PLAN:  Neuroendocrine carcinoma metastatic to liver [facility]) This is a very pleasant 84 year old white female with metastatic  intermediate grade neuroendocrine carcinoma of questionable lung primary and multiple metastatic liver lesions and pancreatic lesions.She status post treatment with radio embolization with Y90 to the left and right lobe liver lesions.Status post treatment with Y 90 to the left and right lobes of the liver. She is currently undergoing systemic chemotherapy with Xeloda and Temodar status post 14 cycles. The patient tolerated the last month of her treatment well with no concerning complaints. I recommended for her to proceed with cycle #15 on September 15, 2017 as scheduled. The patient will have a restaging CT scan of the chest, abdomen, pelvis prior to her next visit. We will see her back for follow-up in 1 month to review her restaging CT scan results.  For the recent diagnosis of pulmonary embolism, she will continue on Xarelto 20 mg p.o. Daily.  I have advised against the use of Aleve given that she has cardiac disease and she is currently on Xarelto.  I advised that she use Tylenol and heat or ice to help control her pain.  She may use her Percocet for severe pain.  I have advised patient that she develops any acute shortness of breath again, that she should call 911 to be evaluated in the emergency room at the time of her symptoms.  The patient was advised to call immediately if she has any concerning symptoms in the interval. The patient voices understanding of current disease status and treatment options and is in agreement with the current care plan. All questions were answered. The patient knows to call the clinic with any problems, questions or concerns. We can certainly see the patient much sooner if necessary.  Orders Placed This Encounter  Procedures  . CT ABDOMEN PELVIS W CONTRAST    Standing Status:   Future    Standing Expiration Date:   09/12/2018  Order Specific Question:   If indicated for the ordered procedure, I authorize the administration of contrast media per Radiology  protocol    Answer:   Yes    Order Specific Question:   Preferred imaging location?    Answer:   [facility]    Order Specific Question:   Radiology Contrast Protocol - do NOT remove file path    Answer:   [email or web address]    Order Specific Question:   Reason for Exam additional comments    Answer:   Neuroendocrine tumor with lung and liver mets. Restaging.  . CT CHEST W CONTRAST    Standing Status:   Future    Standing Expiration Date:   09/12/2018    Order Specific Question:   If indicated for the ordered procedure, I authorize the administration of contrast media per Radiology protocol    Answer:   Yes    Order Specific Question:   Preferred imaging location?    Answer:   [facility]    Order Specific Question:   Radiology Contrast Protocol - do NOT remove file path    Answer:   [email or web address]    Order Specific Question:   Reason for Exam additional comments    Answer:   Neuroendocrine tumor with lung and liver mets. Restaging.  Marland Kitchen CBC with Differential (Cancer Center Only)    Standing Status:   Future    Standing Expiration Date:   09/12/2018  . CMP ([place] only)    Standing Status:   Future    Standing Expiration Date:   09/12/2018    Mikey Bussing, DNP, AGPCNP-BC, AOCNP 09/12/17

## 2017-09-12 NOTE — Assessment & Plan Note (Signed)
This is a very pleasant 84 year old white female with metastatic intermediate grade neuroendocrine carcinoma of questionable lung primary and multiple metastatic liver lesions and pancreatic lesions.She status post treatment with radio embolization with Y90 to the left and right lobe liver lesions.Status post treatment with Y 90 to the left and right lobes of the liver. She is currently undergoing systemic chemotherapy with Xeloda and Temodar status post 14 cycles. The patient tolerated the last month of her treatment well with no concerning complaints. I recommended for her to proceed with cycle #15 on September 15, 2017 as scheduled. The patient will have a restaging CT scan of the chest, abdomen, pelvis prior to her next visit. We will see her back for follow-up in 1 month to review her restaging CT scan results.  For the recent diagnosis of pulmonary embolism, she will continue on Xarelto 20 mg p.o. Daily.  I have advised against the use of Aleve given that she has cardiac disease and she is currently on Xarelto.  I advised that she use Tylenol and heat or ice to help control her pain.  She may use her Percocet for severe pain.  I have advised patient that she develops any acute shortness of breath again, that she should call 911 to be evaluated in the emergency room at the time of her symptoms.  The patient was advised to call immediately if she has any concerning symptoms in the interval. The patient voices understanding of current disease status and treatment options and is in agreement with the current care plan. All questions were answered. The patient knows to call the clinic with any problems, questions or concerns. We can certainly see the patient much sooner if necessary.

## 2017-09-20 ENCOUNTER — Ambulatory Visit (INDEPENDENT_AMBULATORY_CARE_PROVIDER_SITE_OTHER): Payer: Medicare Other | Admitting: Cardiovascular Disease

## 2017-09-20 ENCOUNTER — Encounter: Payer: Self-pay | Admitting: Cardiovascular Disease

## 2017-09-20 DIAGNOSIS — E7800 Pure hypercholesterolemia, unspecified: Secondary | ICD-10-CM

## 2017-09-20 DIAGNOSIS — I480 Paroxysmal atrial fibrillation: Secondary | ICD-10-CM

## 2017-09-20 DIAGNOSIS — I10 Essential (primary) hypertension: Secondary | ICD-10-CM

## 2017-09-20 NOTE — Patient Instructions (Addendum)
Medication Instructions: STOP the Losartan  If you need a refill on your cardiac medications before your next appointment, please call your pharmacy.    Follow-Up: Your physician wants you to follow-up in: 3 months with a APP and 12 months with Dr. Gwenlyn Found. You will receive a reminder letter in the mail two months in advance. If you don't receive a letter, please call our office at [phone] to schedule this follow-up appointment.   Thank you for choosing [facility]!!

## 2017-09-20 NOTE — Progress Notes (Signed)
09/20/2017 [name]   02/20/1934  [id number]  Primary Physician Eustaquio Maize, MD Primary Cardiologist: Lorretta Harp MD Gar[facility], [place], [place]  HPI:  [name] is a 84 y.o.  moderately overweight married Caucasian female mother of 88 (son 74 years old died 77/2 years ago prostate CA), grandmother of 2 grandchildren, who was formally a patient of Dr. Delfino Lovett Lowella Fairy. I last saw her in the office  05/22/16. She has no prior cardiac history. I last saw her in the office 11/10/14. She does have a history of treated hypertension and hyperlipidemia as well as a family history of heart disease. Her father died at age 6 from a heart attack as did her brother. She has another brother who is in open-heart surgery. She has never had a heart attack but has had "mini strokes" in the past. She does get dyspnea on exertion but denies chest pain. I left cell her one year ago. She apparently had Ace sternal fracture secondary to a fall 6 months ago and had a chest CT that showed abnormalities in her lung parenchyma which has been followed by serial CT scans. She was recently admitted to, hospital for chest pain rule out MI. CT angiogram was negative for PE. A Myoview stress test was negative as well. She complains of episodes of weakness as well as palpitations. She does have blood pressure and heart rate readings with some relative bradycardia. Because of palpitations and weakness and performed a 30 day event monitor which showed predominantly sinus rhythm with episodes of sinus bradycardia, pauses up to 2 seconds and a short run of paroxysmal fibrillation with a rapid ventricular response. She is not a good oral anticoagulation patient because of frequent falls and AVMs.  She had a loop recorder implanted by Dr. Rayann Heman which has shown some brief episodes of PAF but no arrhythmias that would be contributing to syncope. She is on Xarelto oral anticoagulation.      Current Meds    Medication Sig  . amitriptyline (ELAVIL) 25 MG tablet Take 1 tablet (25 mg total) by mouth at bedtime.  . capecitabine (XELODA) 500 MG tablet TAKE 3 TABLETS (1,500 MG TOTAL) BY MOUTH 2 (TWO) TIMES DAILY AFTER A MEAL.  Marland Kitchen escitalopram (LEXAPRO) 20 MG tablet Take 1 tablet (20 mg total) by mouth at bedtime.  . fluticasone (FLONASE) 50 MCG/ACT nasal spray Place 2 sprays into both nostrils at bedtime.  . furosemide (LASIX) 20 MG tablet Take 1 tablet (20 mg total) by mouth daily as needed.  . gabapentin (NEURONTIN) 400 MG capsule Take 1-2 capsules (400-800 mg total) by mouth 3 (three) times daily.  . hydroxypropyl methylcellulose / hypromellose (ISOPTO TEARS / GONIOVISC) 2.5 % ophthalmic solution Place 1 drop into both eyes at bedtime.  Marland Kitchen levothyroxine (SYNTHROID, LEVOTHROID) 75 MCG tablet Take 1 tablet (75 mcg total) by mouth daily before breakfast.  . losartan (COZAAR) 50 MG tablet TAKE 1 TABLET DAILY (Patient taking differently: TAKE 1/2 TABLET DAILY)  . lovastatin (MEVACOR) 40 MG tablet Take 1 tablet (40 mg total) by mouth at bedtime.  . Melatonin 3 MG TBDP Take 3-6 mg by mouth at bedtime as needed.  Marland Kitchen omeprazole (PRILOSEC) 40 MG capsule Take 1 capsule (40 mg total) by mouth daily.  . ondansetron (ZOFRAN) 8 MG tablet Take 1 tablet (8 mg total) by mouth every 8 (eight) hours as needed for nausea or vomiting.  Marland Kitchen oxyCODONE-acetaminophen (PERCOCET) 5-325 MG tablet Take 1 tablet by  mouth every 8 (eight) hours as needed.  . temozolomide (TEMODAR) 140 MG capsule TAKE 2 CAPSULES BY MOUTH DAILY. MAY TAKE ON AN EMPTY STOMACH OR AT BEDTIME TO DECREASE NAUSEA AND VOMITING  . Vitamin D, Ergocalciferol, (DRISDOL) 50000 units CAPS capsule TAKE 1 CAPSULE EVERY 7 DAYS  . XARELTO 20 MG TABS tablet TAKE 1 TABLET DAILY WITH SUPPER     Allergies  Allergen Reactions  . Lisinopril Cough    Social History   Socioeconomic History  . Marital status: Married    Spouse name: Not on file  . Number of children: Not  on file  . Years of education: Not on file  . Highest education level: Not on file  Social Needs  . Financial resource strain: Not on file  . Food insecurity - worry: Not on file  . Food insecurity - inability: Not on file  . Transportation needs - medical: Not on file  . Transportation needs - non-medical: Not on file  Occupational History  . Not on file  Tobacco Use  . Smoking status: Former Smoker    Packs/day: 0.25    Years: 10.00    Pack years: 2.50    Types: Cigarettes    Last attempt to quit: 07/31/1975    Years since quitting: 42.1  . Smokeless tobacco: Never Used  Substance and Sexual Activity  . Alcohol use: No    Comment: Rare- maybe a drink ever 2 years  . Drug use: No  . Sexual activity: Not on file  Other Topics Concern  . Not on file  Social History Narrative   Lives with husband, does have stairs, does not use them. Pt completed 10th grade.     Review of Systems: General: negative for chills, fever, night sweats or weight changes.  Cardiovascular: negative for chest pain, dyspnea on exertion, edema, orthopnea, palpitations, paroxysmal nocturnal dyspnea or shortness of breath Dermatological: negative for rash Respiratory: negative for cough or wheezing Urologic: negative for hematuria Abdominal: negative for nausea, vomiting, diarrhea, bright red blood per rectum, melena, or hematemesis Neurologic: negative for visual changes, syncope, or dizziness All other systems reviewed and are otherwise negative except as noted above.    Blood pressure 99/70, pulse (!) 50, height 5\' 4"  (1.626 m), weight 229 lb (103.9 kg).  General appearance: alert and no distress Neck: no adenopathy, no carotid bruit, no JVD, supple, symmetrical, trachea midline and thyroid not enlarged, symmetric, no tenderness/mass/nodules Lungs: clear to auscultation bilaterally Heart: regular rate and rhythm, S1, S2 normal, no murmur, click, rub or gallop Extremities: extremities normal,  atraumatic, no cyanosis or edema Pulses: 2+ and symmetric Skin: Skin color, texture, turgor normal. No rashes or lesions Neurologic: Alert and oriented X 3, normal strength and tone. Normal symmetric reflexes. Normal coordination and gait  EKG not performed today  ASSESSMENT AND PLAN:   Hyperlipemia History of hyperlipidemia on lovastatin followed by her PCP  Essential hypertension History of hyper-tension on losartan. Her blood pressure is somewhat low today 99/70. I'm going to discontinue her losartan.  Paroxysmal atrial fibrillation ([name]) History of paroxysmal A. fib status post loop recorder implantation by Dr. Rayann Heman who follows this. She's had some brief episodes of PAF but no arrhythmias that would contribute to syncope. She is on xarelto oral anticoagulation.      Lorretta Harp MD FACP,FACC,FAHA, [facility] 09/20/2017 11:36 AM

## 2017-09-20 NOTE — Assessment & Plan Note (Signed)
History of hyperlipidemia on lovastatin followed by her PCP

## 2017-09-20 NOTE — Assessment & Plan Note (Signed)
History of hyper-tension on losartan. Her blood pressure is somewhat low today 99/70. I'm going to discontinue her losartan.

## 2017-09-20 NOTE — Assessment & Plan Note (Signed)
History of paroxysmal A. fib status post loop recorder implantation by Dr. Rayann Heman who follows this. She's had some brief episodes of PAF but no arrhythmias that would contribute to syncope. She is on xarelto oral anticoagulation.

## 2017-09-20 NOTE — Addendum Note (Signed)
Addended by: Ricci Barker on: 09/20/2017 11:42 AM   Modules accepted: Orders

## 2017-09-25 ENCOUNTER — Ambulatory Visit (INDEPENDENT_AMBULATORY_CARE_PROVIDER_SITE_OTHER): Payer: Medicare Other | Admitting: *Deleted

## 2017-09-25 DIAGNOSIS — R002 Palpitations: Secondary | ICD-10-CM

## 2017-09-26 NOTE — Progress Notes (Signed)
Carelink Summary Report / Loop Recorder 

## 2017-10-08 ENCOUNTER — Other Ambulatory Visit: Payer: Self-pay | Admitting: *Deleted

## 2017-10-08 ENCOUNTER — Ambulatory Visit (HOSPITAL_COMMUNITY)
Admission: RE | Admit: 2017-10-08 | Discharge: 2017-10-08 | Disposition: A | Discharge status: Home / Self Care | Payer: Medicare Other | Source: Ambulatory Visit | Attending: Oncology | Admitting: Oncology

## 2017-10-08 ENCOUNTER — Encounter (HOSPITAL_COMMUNITY): Payer: Self-pay

## 2017-10-08 ENCOUNTER — Inpatient Hospital Stay: Payer: Medicare Other | Attending: Internal Medicine

## 2017-10-08 DIAGNOSIS — F419 Anxiety disorder, unspecified: Secondary | ICD-10-CM | POA: Insufficient documentation

## 2017-10-08 DIAGNOSIS — E559 Vitamin D deficiency, unspecified: Secondary | ICD-10-CM

## 2017-10-08 DIAGNOSIS — C787 Secondary malignant neoplasm of liver and intrahepatic bile duct: Secondary | ICD-10-CM | POA: Insufficient documentation

## 2017-10-08 DIAGNOSIS — Z86711 Personal history of pulmonary embolism: Secondary | ICD-10-CM | POA: Insufficient documentation

## 2017-10-08 DIAGNOSIS — F329 Major depressive disorder, single episode, unspecified: Secondary | ICD-10-CM | POA: Diagnosis not present

## 2017-10-08 DIAGNOSIS — I480 Paroxysmal atrial fibrillation: Secondary | ICD-10-CM | POA: Diagnosis not present

## 2017-10-08 DIAGNOSIS — I700 Atherosclerosis of aorta: Secondary | ICD-10-CM | POA: Insufficient documentation

## 2017-10-08 DIAGNOSIS — E7800 Pure hypercholesterolemia, unspecified: Secondary | ICD-10-CM | POA: Insufficient documentation

## 2017-10-08 DIAGNOSIS — C7A8 Other malignant neuroendocrine tumors: Secondary | ICD-10-CM

## 2017-10-08 DIAGNOSIS — K219 Gastro-esophageal reflux disease without esophagitis: Secondary | ICD-10-CM | POA: Insufficient documentation

## 2017-10-08 DIAGNOSIS — C7A00 Malignant carcinoid tumor of unspecified site: Secondary | ICD-10-CM | POA: Diagnosis not present

## 2017-10-08 DIAGNOSIS — Z79899 Other long term (current) drug therapy: Secondary | ICD-10-CM | POA: Insufficient documentation

## 2017-10-08 DIAGNOSIS — K589 Irritable bowel syndrome without diarrhea: Secondary | ICD-10-CM | POA: Diagnosis not present

## 2017-10-08 DIAGNOSIS — C7B02 Secondary carcinoid tumors of liver: Secondary | ICD-10-CM | POA: Diagnosis not present

## 2017-10-08 DIAGNOSIS — E039 Hypothyroidism, unspecified: Secondary | ICD-10-CM | POA: Diagnosis not present

## 2017-10-08 DIAGNOSIS — C7B8 Other secondary neuroendocrine tumors: Secondary | ICD-10-CM

## 2017-10-08 DIAGNOSIS — Z7901 Long term (current) use of anticoagulants: Secondary | ICD-10-CM | POA: Diagnosis not present

## 2017-10-08 DIAGNOSIS — Z8673 Personal history of transient ischemic attack (TIA), and cerebral infarction without residual deficits: Secondary | ICD-10-CM | POA: Insufficient documentation

## 2017-10-08 DIAGNOSIS — E669 Obesity, unspecified: Secondary | ICD-10-CM | POA: Insufficient documentation

## 2017-10-08 DIAGNOSIS — I10 Essential (primary) hypertension: Secondary | ICD-10-CM | POA: Insufficient documentation

## 2017-10-08 DIAGNOSIS — I712 Thoracic aortic aneurysm, without rupture: Secondary | ICD-10-CM | POA: Insufficient documentation

## 2017-10-08 DIAGNOSIS — R918 Other nonspecific abnormal finding of lung field: Secondary | ICD-10-CM | POA: Insufficient documentation

## 2017-10-08 DIAGNOSIS — G319 Degenerative disease of nervous system, unspecified: Secondary | ICD-10-CM | POA: Insufficient documentation

## 2017-10-08 LAB — CMP (CANCER CENTER ONLY)
ALT: 16 U/L (ref 0–55)
AST: 25 U/L (ref 5–34)
Albumin: 3.7 g/dL (ref 3.5–5.0)
Alkaline Phosphatase: 95 U/L (ref 40–150)
Anion gap: 7 (ref 3–11)
BILIRUBIN TOTAL: 0.7 mg/dL (ref 0.2–1.2)
BUN: 12 mg/dL (ref 7–26)
CO2: 27 mmol/L (ref 22–29)
Calcium: 10.4 mg/dL (ref 8.4–10.4)
Chloride: 102 mmol/L (ref 98–109)
Creatinine: 0.93 mg/dL (ref 0.60–1.10)
GFR, Estimated: 56 mL/min — ABNORMAL LOW (ref >60)
Glucose, Bld: 102 mg/dL (ref 70–140)
POTASSIUM: 5.4 mmol/L — AB (ref 3.5–5.1)
Sodium: 136 mmol/L (ref 136–145)
TOTAL PROTEIN: 7 g/dL (ref 6.4–8.3)

## 2017-10-08 LAB — CBC WITH DIFFERENTIAL (CANCER CENTER ONLY)
BASOS ABS: 0 10*3/uL (ref 0.0–0.1)
Basophils Relative: 1 %
EOS PCT: 4 %
Eosinophils Absolute: 0.2 10*3/uL (ref 0.0–0.5)
HCT: 38.2 % (ref 34.8–46.6)
Hemoglobin: 12.6 g/dL (ref 11.6–15.9)
LYMPHS PCT: 23 %
Lymphs Abs: 0.9 10*3/uL (ref 0.9–3.3)
MCH: 34.8 pg — ABNORMAL HIGH (ref 25.1–34.0)
MCHC: 33 g/dL (ref 31.5–36.0)
MCV: 105.5 fL — AB (ref 79.5–101.0)
Monocytes Absolute: 0.7 10*3/uL (ref 0.1–0.9)
Monocytes Relative: 19 %
NEUTROS PCT: 53 %
Neutro Abs: 1.9 10*3/uL (ref 1.5–6.5)
PLATELETS: 100 10*3/uL — AB (ref 145–400)
RBC: 3.62 MIL/uL — AB (ref 3.70–5.45)
RDW: 16.6 % — ABNORMAL HIGH (ref 11.2–14.5)
WBC: 3.7 10*3/uL — AB (ref 3.9–10.3)

## 2017-10-08 MED ORDER — SODIUM CHLORIDE 0.9 % IJ SOLN
INTRAMUSCULAR | Status: AC
  Filled 2017-10-08: qty 50

## 2017-10-08 MED ORDER — IOPAMIDOL (ISOVUE-300) INJECTION 61%
INTRAVENOUS | Status: AC
  Administered 2017-10-08: 80 mL via INTRAVENOUS
  Filled 2017-10-08: qty 100

## 2017-10-08 MED ORDER — IOPAMIDOL (ISOVUE-300) INJECTION 61%
100.0000 mL | Freq: Once | INTRAVENOUS | Status: AC | PRN
  Administered 2017-10-08: 80 mL via INTRAVENOUS

## 2017-10-09 ENCOUNTER — Other Ambulatory Visit (HOSPITAL_COMMUNITY): Payer: Self-pay | Admitting: Interventional Radiology

## 2017-10-09 DIAGNOSIS — C787 Secondary malignant neoplasm of liver and intrahepatic bile duct: Secondary | ICD-10-CM

## 2017-10-10 ENCOUNTER — Telehealth: Payer: Self-pay | Admitting: Oncology

## 2017-10-10 ENCOUNTER — Encounter: Payer: Self-pay | Admitting: Oncology

## 2017-10-10 ENCOUNTER — Inpatient Hospital Stay (HOSPITAL_BASED_OUTPATIENT_CLINIC_OR_DEPARTMENT_OTHER): Payer: Medicare Other | Admitting: Oncology

## 2017-10-10 VITALS — BP 136/52 | HR 74 | Temp 98.3°F | Resp 20 | Ht 64.0 in | Wt 228.3 lb

## 2017-10-10 DIAGNOSIS — C7B8 Other secondary neuroendocrine tumors: Secondary | ICD-10-CM | POA: Diagnosis not present

## 2017-10-10 DIAGNOSIS — I2699 Other pulmonary embolism without acute cor pulmonale: Secondary | ICD-10-CM

## 2017-10-10 DIAGNOSIS — Z7901 Long term (current) use of anticoagulants: Secondary | ICD-10-CM | POA: Diagnosis not present

## 2017-10-10 DIAGNOSIS — Z79899 Other long term (current) drug therapy: Secondary | ICD-10-CM | POA: Diagnosis not present

## 2017-10-10 DIAGNOSIS — I10 Essential (primary) hypertension: Secondary | ICD-10-CM | POA: Diagnosis not present

## 2017-10-10 DIAGNOSIS — C7B02 Secondary carcinoid tumors of liver: Secondary | ICD-10-CM | POA: Diagnosis not present

## 2017-10-10 DIAGNOSIS — Z86711 Personal history of pulmonary embolism: Secondary | ICD-10-CM | POA: Diagnosis not present

## 2017-10-10 DIAGNOSIS — Z5111 Encounter for antineoplastic chemotherapy: Secondary | ICD-10-CM

## 2017-10-10 DIAGNOSIS — C7A8 Other malignant neuroendocrine tumors: Secondary | ICD-10-CM

## 2017-10-10 MED FILL — CAPECITABINE 500 MG TABLET: 500 | 14 days supply | Qty: 84 | Fill #1

## 2017-10-10 MED FILL — TEMOZOLOMIDE 140 MG CAPSULE: 140 | 28 days supply | Qty: 10 | Fill #1

## 2017-10-10 NOTE — Telephone Encounter (Signed)
Gave patient avs and calendar with appts per 3/14 los.

## 2017-10-10 NOTE — Progress Notes (Addendum)
[facility] OFFICE PROGRESS NOTE  [name], [place] [address]  DIAGNOSIS:  1) Metastatic intermediate. Neuroendocrine tumor of lung primary diagnosed in January 2018 and presented with small bilateral pulmonary nodules in addition to multiple liver metastasis. 2) right lower lobe pulmonary embolism diagnosed incidentally on CT scan of the chest on 04/23/2017  PRIOR THERAPY: 1) Status post radio embolization with Y 90 to the liver lesions by interventional radiology. 2) status post IVC filter placement by interventional radiology on 04/24/2017  CURRENT THERAPY: Xeloda 750 MG/M2 twice a day days 1-14 and Temodar 150 MG/M2 days 10-14 every 4 weeks. Status post 13cycles. Shewill start cycle #16 on October 13, 2017.  INTERVAL HISTORY: [name] 84 y.o. female returns for routine follow-up visit accompanied by her husband.  The patient is feeling fine today and has no specific complaints except for ongoing arthritis to her knees.  Her last visit, her cardiologist has discontinued 1 of her blood pressure medications and she reports that her blood pressure has been better.  The patient denies fevers and chills.  Denies chest pain, shortness of breath, cough, hemoptysis.  Denies nausea, vomiting, constipation, diarrhea.  Denies skin rashes.  The patient had a recent restaging CT scan is here to discuss the results.  MEDICAL HISTORY: Past Medical History:  Diagnosis Date  . Acute respiratory failure with hypoxia ([place] [facility]) 10/23/2015  . Allergic rhinitis    PT. DENIES  . Anxiety   . Arthritis    NECK  . Ataxia   . Bradycardia    primarily nocturnal  . Burning tongue syndrome 25 years  . Cancer ([facility]) dx'd 06/2016   liver  . Cataract   . Cerebellar degeneration   . Chronic pericarditis   . Chronic urinary tract infection   . Complication of anesthesia    low o2 sats, coded 30 years ago  . CVA (cerebral infarction) 05/2003  . Depression   .  Encounter for antineoplastic chemotherapy 10/09/2016  . Gait disorder   . Gastric polyps   . GERD (gastroesophageal reflux disease)   . Goals of care, counseling/discussion 10/09/2016  . High cholesterol   . Hyperlipidemia   . Hypertension   . Hypertension   . Hypotension   . Hypothyroidism   . IBS (irritable bowel syndrome)   . Obesity   . Paroxysmal atrial fibrillation ([name])    chads2vasc score is 6,  she is felt to be a poor candidate for anticoagulation  . Personal history of arterial venous malformation (AVM)    right side of face  . Seizure disorder ([place])   . Seizures ([facility] [facility]) 2003   " smelling"- Gabapentin "no problem"  . Shortness of breath dyspnea    with exertion  . Sternum fx 10/27/2013  . Stroke [facility]) 5 years ago   Right side of face weak, slurred speach-   . Thyroid disease   . TIA (transient ischemic attack)   . UTI (lower urinary tract infection) 03/27/2016   "frequently"    ALLERGIES:  is allergic to lisinopril.  MEDICATIONS:  Current Outpatient Medications  Medication Sig Dispense Refill  . amitriptyline (ELAVIL) 25 MG tablet Take 1 tablet (25 mg total) by mouth at bedtime. 90 tablet 1  . escitalopram (LEXAPRO) 20 MG tablet Take 1 tablet (20 mg total) by mouth at bedtime. 90 tablet 1  . fluticasone (FLONASE) 50 MCG/ACT nasal spray Place 2 sprays into both nostrils at bedtime. 16 g 3  .  gabapentin (NEURONTIN) 400 MG capsule Take 1-2 capsules (400-800 mg total) by mouth 3 (three) times daily. 720 capsule 1  . hydroxypropyl methylcellulose / hypromellose (ISOPTO TEARS / GONIOVISC) 2.5 % ophthalmic solution Place 1 drop into both eyes at bedtime.    Marland Kitchen levothyroxine (SYNTHROID, LEVOTHROID) 75 MCG tablet Take 1 tablet (75 mcg total) by mouth daily before breakfast. 90 tablet 1  . lovastatin (MEVACOR) 40 MG tablet Take 1 tablet (40 mg total) by mouth at bedtime. 90 tablet 1  . Melatonin 3 MG TBDP Take 3-6 mg by mouth at bedtime as needed. 60 tablet 1  . omeprazole  (PRILOSEC) 40 MG capsule Take 1 capsule (40 mg total) by mouth daily. 90 capsule 1  . ondansetron (ZOFRAN) 8 MG tablet Take 1 tablet (8 mg total) by mouth every 8 (eight) hours as needed for nausea or vomiting. 90 tablet 1  . Vitamin D, Ergocalciferol, (DRISDOL) 50000 units CAPS capsule TAKE 1 CAPSULE EVERY 7 DAYS 12 capsule 0  . XARELTO 20 MG TABS tablet TAKE 1 TABLET DAILY WITH SUPPER 90 tablet 0  . capecitabine (XELODA) 500 MG tablet TAKE 3 TABLETS (1,500 MG TOTAL) BY MOUTH 2 (TWO) TIMES DAILY AFTER A MEAL. 84 tablet 2  . furosemide (LASIX) 20 MG tablet TAKE 1 TABLET DAILY AS NEEDED 90 tablet 0  . oxyCODONE-acetaminophen (PERCOCET) 5-325 MG tablet Take 1 tablet by mouth every 8 (eight) hours as needed. (Patient not taking: Reported on 10/10/2017) 30 tablet 0  . temozolomide (TEMODAR) 140 MG capsule TAKE 2 CAPSULES BY MOUTH DAILY. MAY TAKE ON AN EMPTY STOMACH OR AT BEDTIME TO DECREASE NAUSEA AND VOMITING 10 capsule 2   No current facility-administered medications for this visit.     SURGICAL HISTORY:  Past Surgical History:  Procedure Laterality Date  . APPENDECTOMY  84 years old  . COLONOSCOPY  2006, 2009  . COLONOSCOPY WITH PROPOFOL N/A 03/28/2016   Procedure: COLONOSCOPY WITH PROPOFOL;  Surgeon: Gatha Mayer, MD;  Location: [facility];  Service: Endoscopy;  Laterality: N/A;  . cyst removed  35 years ago  . EP IMPLANTABLE DEVICE N/A 01/06/2015   Procedure: Loop Recorder Insertion;  Surgeon: [place] Grayer, MD;  Location: [facility];  Service: Cardiovascular;  Laterality: N/A;  . EYE SURGERY Right    Cataract  . IR ANGIOGRAM SELECTIVE EACH ADDITIONAL VESSEL  11/28/2016  . IR ANGIOGRAM SELECTIVE EACH ADDITIONAL VESSEL  11/28/2016  . IR ANGIOGRAM SELECTIVE EACH ADDITIONAL VESSEL  11/28/2016  . IR ANGIOGRAM SELECTIVE EACH ADDITIONAL VESSEL  11/28/2016  . IR ANGIOGRAM SELECTIVE EACH ADDITIONAL VESSEL  12/13/2016  . IR ANGIOGRAM SELECTIVE EACH ADDITIONAL VESSEL  12/13/2016  . IR ANGIOGRAM  SELECTIVE EACH ADDITIONAL VESSEL  01/09/2017  . IR ANGIOGRAM VISCERAL SELECTIVE  11/28/2016  . IR ANGIOGRAM VISCERAL SELECTIVE  11/28/2016  . IR ANGIOGRAM VISCERAL SELECTIVE  12/13/2016  . IR ANGIOGRAM VISCERAL SELECTIVE  12/13/2016  . IR ANGIOGRAM VISCERAL SELECTIVE  01/09/2017  . IR EMBO ARTERIAL NOT HEMORR HEMANG INC GUIDE ROADMAPPING  11/28/2016  . IR EMBO TUMOR ORGAN ISCHEMIA INFARCT INC GUIDE ROADMAPPING  12/13/2016  . IR EMBO TUMOR ORGAN ISCHEMIA INFARCT INC GUIDE ROADMAPPING  01/09/2017  . IR IVC FILTER PLMT / S&I /IMG GUID/MOD SED  04/24/2017  . IR RADIOLOGIST EVAL & MGMT  11/06/2016  . IR RADIOLOGIST EVAL & MGMT  01/02/2017  . IR RADIOLOGIST EVAL & MGMT  02/05/2017  . IR RADIOLOGIST EVAL & MGMT  05/02/2017  . IR RADIOLOGIST  EVAL & MGMT  10/17/2017  . IR US GUIDE VASC ACCESS RIGHT  11/28/2016  . IR US GUIDE VASC ACCESS RIGHT  12/13/2016  . IR US GUIDE VASC ACCESS RIGHT  01/09/2017  . KNEE ARTHROSCOPY Right 11/14/2006  . KNEE ARTHROSCOPY Bilateral 5 and 6 years ago  . KNEE ARTHROSCOPY WITH LATERAL MENISECTOMY  07/03/2012   Procedure: KNEE ARTHROSCOPY WITH LATERAL MENISECTOMY;  Surgeon: Magnus Sinning, MD;  Location: [facility];  Service: Orthopedics;  Laterality: Left;  with Partial Lateral Menisectomy and Medial Menisectomy. Shaving of medial and lateral femoral condyles. Shaving of patella. Removal of a loose body  . tibial and fibular internal fixation Left   . TOTAL ABDOMINAL HYSTERECTOMY  84 years old  . UPPER GASTROINTESTINAL ENDOSCOPY  2009, 2013    REVIEW OF SYSTEMS:   Review of Systems  Constitutional: Negative for appetite change, chills, fatigue, fever and unexpected weight change.  HENT:   Negative for mouth sores, nosebleeds, sore throat and trouble swallowing.   Eyes: Negative for eye problems and icterus.  Respiratory: Negative for cough, hemoptysis, shortness of breath and wheezing.   Cardiovascular: Negative for chest pain and leg swelling.  Gastrointestinal: Negative for abdominal  pain, constipation, diarrhea, nausea and vomiting.  Genitourinary: Negative for bladder incontinence, difficulty urinating, dysuria, frequency and hematuria.   Musculoskeletal: Negative for back pain, neck pain and neck stiffness. Positive for knee pain. Skin: Negative for itching and rash.  Neurological: Negative for dizziness, extremity weakness, headaches, light-headedness and seizures.  Hematological: Negative for adenopathy. Does not bruise/bleed easily.  Psychiatric/Behavioral: Negative for confusion, depression and sleep disturbance. The patient is not nervous/anxious.     PHYSICAL EXAMINATION:  Blood pressure (!) 136/52, pulse 74, temperature 98.3 F (36.8 C), temperature source Oral, resp. rate 20, height 5\' 4"  (1.626 m), weight 228 lb 4.8 oz (103.6 kg), SpO2 100 %.  ECOG PERFORMANCE STATUS: 1 - Symptomatic but completely ambulatory  Physical Exam  Constitutional: Oriented to person, place, and time and well-developed, well-nourished, and in no distress. No distress.  HENT:  Head: Normocephalic and atraumatic.  Mouth/Throat: Oropharynx is clear and moist. No oropharyngeal exudate.  Eyes: Conjunctivae are normal. Right eye exhibits no discharge. Left eye exhibits no discharge. No scleral icterus.  Neck: Normal range of motion. Neck supple.  Cardiovascular: Normal rate, regular rhythm, normal heart sounds and intact distal pulses.   Pulmonary/Chest: Effort normal and breath sounds normal. No respiratory distress. No wheezes. No rales.  Abdominal: Soft. Bowel sounds are normal. Exhibits no distension and no mass. There is no tenderness.  Musculoskeletal: Normal range of motion. Exhibits no edema.  Lymphadenopathy:    No cervical adenopathy.  Neurological: Alert and oriented to person, place, and time. Exhibits normal muscle tone. Coordination normal.  Skin: Skin is warm and dry. No rash noted. Not diaphoretic. No erythema. No pallor.  Psychiatric: Mood, memory and judgment normal.   Vitals reviewed.  LABORATORY DATA: Lab Results  Component Value Date   WBC 3.5 (L) 11/05/2017   HGB 13.1 08/22/2017   HCT 38.1 11/05/2017   MCV 103.5 (H) 11/05/2017   PLT 96 (L) 11/05/2017      Chemistry      Component Value Date/Time   NA 136 10/08/2017 1356   NA 137 08/22/2017 1653   NA 135 (L) 07/10/2017 1142   K 5.4 (H) 10/08/2017 1356   K 4.6 07/10/2017 1142   CL 102 10/08/2017 1356   CO2 27 10/08/2017 1356   CO2 25 07/10/2017 1142  BUN 12 10/08/2017 1356   BUN 13 08/22/2017 1653   BUN 11.4 07/10/2017 1142   CREATININE 0.93 10/08/2017 1356   CREATININE 1.0 07/10/2017 1142      Component Value Date/Time   CALCIUM 10.4 10/08/2017 1356   CALCIUM 9.9 07/10/2017 1142   ALKPHOS 95 10/08/2017 1356   ALKPHOS 93 07/10/2017 1142   AST 25 10/08/2017 1356   AST 43 (H) 07/10/2017 1142   ALT 16 10/08/2017 1356   ALT 28 07/10/2017 1142   BILITOT 0.7 10/08/2017 1356   BILITOT 0.56 07/10/2017 1142       RADIOGRAPHIC STUDIES:  Ct Chest W Contrast  Result Date: 10/09/2017 CLINICAL DATA:  Metastatic neuroendocrine tumor status post Y 90 treatment for liver lesions. EXAM: CT CHEST, ABDOMEN, AND PELVIS WITH CONTRAST TECHNIQUE: Multidetector CT imaging of the chest, abdomen and pelvis was performed following the standard protocol during bolus administration of intravenous contrast. CONTRAST:  3mL ISOVUE-300 IOPAMIDOL (ISOVUE-300) INJECTION 61% COMPARISON:  09/10/2016 FINDINGS: CT CHEST FINDINGS Cardiovascular: The heart is normal in size. No pericardial effusion. The aorta demonstrates stable atherosclerotic calcifications and tortuosity but no dissection. Stable mild aneurysmal dilatation of the ascending aorta with maximum measurement of 4 cm. Stable mild pulmonary artery enlargement which may suggest pulmonary hypertension. Stable three-vessel coronary artery calcifications. Mediastinum/Nodes: No mediastinal or hilar mass or lymphadenopathy. Small scattered lymph nodes are  stable. The esophagus is grossly normal. Lungs/Pleura: There are numerous sub 4 mm bilateral pulmonary nodules which appears stable. The right upper lobe lesion on image number 34 measures 12 mm and is stable. 4 mm left lower lobe pulmonary nodule along the major fissure is also stable. No new pulmonary lesions. No acute overlying pulmonary process.  No pleural effusion. Musculoskeletal: No breast masses, supraclavicular or axillary lymphadenopathy. A loop recorder is noted in the left chest wall. The bony structures are unremarkable and stable. Remote healed sternal fracture again noted. CT ABDOMEN PELVIS FINDINGS Hepatobiliary: Stable hepatic metastatic lesions. 19 x 15 mm peripheral segment 8 lesion is unchanged. 12.5 mm segment 2 lesion is unchanged. 24 x 20 mm segment 3 lesion is stable. No new hepatic lesions. Soft tissue debris noted in the fundal region of the gallbladder which could be focal adenomyomatosis or gallbladder sludge. No intrahepatic biliary dilatation. No common bile duct dilatation. Pancreas: No mass, inflammation or ductal dilatation. Spleen: Normal size.  No focal lesions. Adrenals/Urinary Tract: The adrenal glands and kidneys are unremarkable. Small lower pole left renal calculus is stable. Stomach/Bowel: The stomach, duodenum, small bowel and colon are unremarkable. No acute inflammatory changes, mass lesions or obstructive findings. The terminal ileum is normal. The appendix is not identified. Vascular/Lymphatic: Stable advanced atherosclerotic calcifications involving the aorta and iliac arteries. No aneurysm. The major venous structures are patent. An IVC filter is again demonstrated. No mesenteric or retroperitoneal mass or adenopathy. Reproductive: The uterus is surgically absent. The right ovary is still present and contains a small cyst. Other: No pelvic mass or adenopathy. No free pelvic fluid collections. No inguinal mass or adenopathy. No abdominal wall hernia or subcutaneous  lesions. Musculoskeletal: No significant bony findings. IMPRESSION: 1. Stable CT appearance of the chest, abdomen and pelvis. Numerous stable pulmonary nodules without new or progressive findings. Stable hepatic metastatic lesions without new or progressive findings. 2. Stable advanced atherosclerotic calcifications involving the thoracic and abdominal aorta and branch vessels. 3. Stable fusiform aneurysmal dilatation of the ascending aorta with maximum measurement of 4.2 cm. Electronically Signed   By: Marijo Sanes  M.D.   On: 10/09/2017 08:36   Ct Abdomen Pelvis W Contrast  Result Date: 10/09/2017 CLINICAL DATA:  Metastatic neuroendocrine tumor status post Y 90 treatment for liver lesions. EXAM: CT CHEST, ABDOMEN, AND PELVIS WITH CONTRAST TECHNIQUE: Multidetector CT imaging of the chest, abdomen and pelvis was performed following the standard protocol during bolus administration of intravenous contrast. CONTRAST:  43mL ISOVUE-300 IOPAMIDOL (ISOVUE-300) INJECTION 61% COMPARISON:  09/10/2016 FINDINGS: CT CHEST FINDINGS Cardiovascular: The heart is normal in size. No pericardial effusion. The aorta demonstrates stable atherosclerotic calcifications and tortuosity but no dissection. Stable mild aneurysmal dilatation of the ascending aorta with maximum measurement of 4 cm. Stable mild pulmonary artery enlargement which may suggest pulmonary hypertension. Stable three-vessel coronary artery calcifications. Mediastinum/Nodes: No mediastinal or hilar mass or lymphadenopathy. Small scattered lymph nodes are stable. The esophagus is grossly normal. Lungs/Pleura: There are numerous sub 4 mm bilateral pulmonary nodules which appears stable. The right upper lobe lesion on image number 34 measures 12 mm and is stable. 4 mm left lower lobe pulmonary nodule along the major fissure is also stable. No new pulmonary lesions. No acute overlying pulmonary process.  No pleural effusion. Musculoskeletal: No breast masses,  supraclavicular or axillary lymphadenopathy. A loop recorder is noted in the left chest wall. The bony structures are unremarkable and stable. Remote healed sternal fracture again noted. CT ABDOMEN PELVIS FINDINGS Hepatobiliary: Stable hepatic metastatic lesions. 19 x 15 mm peripheral segment 8 lesion is unchanged. 12.5 mm segment 2 lesion is unchanged. 24 x 20 mm segment 3 lesion is stable. No new hepatic lesions. Soft tissue debris noted in the fundal region of the gallbladder which could be focal adenomyomatosis or gallbladder sludge. No intrahepatic biliary dilatation. No common bile duct dilatation. Pancreas: No mass, inflammation or ductal dilatation. Spleen: Normal size.  No focal lesions. Adrenals/Urinary Tract: The adrenal glands and kidneys are unremarkable. Small lower pole left renal calculus is stable. Stomach/Bowel: The stomach, duodenum, small bowel and colon are unremarkable. No acute inflammatory changes, mass lesions or obstructive findings. The terminal ileum is normal. The appendix is not identified. Vascular/Lymphatic: Stable advanced atherosclerotic calcifications involving the aorta and iliac arteries. No aneurysm. The major venous structures are patent. An IVC filter is again demonstrated. No mesenteric or retroperitoneal mass or adenopathy. Reproductive: The uterus is surgically absent. The right ovary is still present and contains a small cyst. Other: No pelvic mass or adenopathy. No free pelvic fluid collections. No inguinal mass or adenopathy. No abdominal wall hernia or subcutaneous lesions. Musculoskeletal: No significant bony findings. IMPRESSION: 1. Stable CT appearance of the chest, abdomen and pelvis. Numerous stable pulmonary nodules without new or progressive findings. Stable hepatic metastatic lesions without new or progressive findings. 2. Stable advanced atherosclerotic calcifications involving the thoracic and abdominal aorta and branch vessels. 3. Stable fusiform aneurysmal  dilatation of the ascending aorta with maximum measurement of 4.2 cm. Electronically Signed   By: Marijo Sanes M.D.   On: 10/09/2017 08:36   Ir Radiologist Eval & Mgmt  Result Date: 10/17/2017 Please refer to notes tab for details about interventional procedure. (Op Note)    ASSESSMENT/PLAN:  Neuroendocrine carcinoma metastatic to liver [facility]) This is a very pleasant 84 year old white female with metastatic intermediate grade neuroendocrine carcinoma of questionable lung primary and multiple metastatic liver lesions and pancreatic lesions.She status post treatment with radio embolization with Y90 to the left and right lobe liver lesions.Status post treatment with Y 90 to the left and right lobes of the  liver. She is currently undergoing systemic chemotherapy with Xeloda and Temodar status post 15cycles. The patient tolerated the last month of her treatment well with no concerning complaints. She had a recent restaging CT scan of the chest, abdomen, pelvis and is here to discuss the results.  The patient was seen with Dr. [name].  We discussed that her CT scan shows no evidence of disease progression.  Recommend that she continue on her current treatment.  She will proceed with cycle #16 on October 13, 2017.  We will see her back for follow-up in 1 month for evaluation and repeat lab work.  For the recent diagnosis of pulmonary embolism, she will continue on Xarelto 20 mg p.o. Daily.  The patient was advised to call immediately if she has any concerning symptoms in the interval. The patient voices understanding of current disease status and treatment options and is in agreement with the current care plan. All questions were answered. The patient knows to call the clinic with any problems, questions or concerns. We can certainly see the patient much sooner if necessary.  Orders Placed This Encounter  Procedures  . CBC with Differential (Cancer Center Only)    Standing Status:   Future     Number of Occurrences:   1    Standing Expiration Date:   10/11/2018  . CMP ([facility] only)    Standing Status:   Future    Number of Occurrences:   1    Standing Expiration Date:   10/11/2018  . CBC with Differential (Cancer Center Only)    Standing Status:   Future    Standing Expiration Date:   10/11/2018  . CMP ([facility] only)    Standing Status:   Future    Standing Expiration Date:   10/11/2018    Mikey Bussing, DNP, AGPCNP-BC, AOCNP 11/05/17  ADDENDUM: Hematology/Oncology Attending: I had a face-to-face encounter with the patient.  They recommended her care plan.  This is a very pleasant 84 years old white female with metastatic neuroendocrine carcinoma.  She is status post local radioembolization treatment with Y 90 to the liver.  She is currently on treatment with systemic therapy with Xeloda and Temodar status post 15 cycles.  The patient has been tolerating this treatment well with no concerning complaints. She had a repeat CT scan of the chest, abdomen and pelvis performed recently.  I personally and independently reviewed the scans and discussed the results with the patient and her husband. Her scan showed no evidence for disease progression. I recommended for the patient to continue her current treatment with Xeloda and Temodar as a scheduled. She will come back for follow-up visit in 1 months for evaluation and repeat blood work. The patient was advised to call immediately if she has any concerning symptoms in the interval.  Disclaimer: This note was dictated with voice recognition software. Similar sounding words can inadvertently be transcribed and may be missed upon review. Mikey Bussing, NP 11/05/17

## 2017-10-10 NOTE — Assessment & Plan Note (Addendum)
This is a very pleasant 84 year old white female with metastatic intermediate grade neuroendocrine carcinoma of questionable lung primary and multiple metastatic liver lesions and pancreatic lesions.She status post treatment with radio embolization with Y90 to the left and right lobe liver lesions.Status post treatment with Y 90 to the left and right lobes of the liver. She is currently undergoing systemic chemotherapy with Xeloda and Temodar status post 15cycles. The patient tolerated the last month of her treatment well with no concerning complaints. She had a recent restaging CT scan of the chest, abdomen, pelvis and is here to discuss the results.  The patient was seen with Dr. [name].  We discussed that her CT scan shows no evidence of disease progression.  Recommend that she continue on her current treatment.  She will proceed with cycle #16 on October 13, 2017.  We will see her back for follow-up in 1 month for evaluation and repeat lab work.  For the recent diagnosis of pulmonary embolism, she will continue on Xarelto 20 mg p.o. Daily.  The patient was advised to call immediately if she has any concerning symptoms in the interval. The patient voices understanding of current disease status and treatment options and is in agreement with the current care plan. All questions were answered. The patient knows to call the clinic with any problems, questions or concerns. We can certainly see the patient much sooner if necessary.

## 2017-10-14 ENCOUNTER — Other Ambulatory Visit: Payer: Self-pay | Admitting: Pediatrics

## 2017-10-14 DIAGNOSIS — R609 Edema, unspecified: Secondary | ICD-10-CM

## 2017-10-17 ENCOUNTER — Encounter: Payer: Self-pay | Admitting: *Deleted

## 2017-10-17 ENCOUNTER — Ambulatory Visit
Admission: RE | Admit: 2017-10-17 | Discharge: 2017-10-17 | Disposition: A | Discharge status: Home / Self Care | Payer: Medicare Other | Source: Ambulatory Visit | Attending: Interventional Radiology | Admitting: Interventional Radiology

## 2017-10-17 DIAGNOSIS — C7B8 Other secondary neuroendocrine tumors: Secondary | ICD-10-CM | POA: Diagnosis not present

## 2017-10-17 DIAGNOSIS — C787 Secondary malignant neoplasm of liver and intrahepatic bile duct: Secondary | ICD-10-CM | POA: Diagnosis not present

## 2017-10-17 DIAGNOSIS — C7A1 Malignant poorly differentiated neuroendocrine tumors: Secondary | ICD-10-CM | POA: Diagnosis not present

## 2017-10-17 HISTORY — PX: IR RADIOLOGIST EVAL & MGMT: IMG5224

## 2017-10-17 NOTE — Progress Notes (Signed)
Referring Physician(s): Dr Mayme Genta  Chief Complaint: The patient is seen in follow up today s/p Y90 right liver lesion 11/2016 and left lesion 12/2016  History of present illness:  Oncology Note 10/10/17 This is a very pleasant 84 year old white female with metastatic intermediate grade neuroendocrine carcinoma of questionable lung primary and multiple metastatic liver lesions and pancreatic lesions.She status post treatment with radio embolization with Y90 to the left and right lobe liver lesions.Status post treatment with Y 90 to the left and right lobes of the liver. She is currently undergoing systemic chemotherapy with Xeloda and Temodar status post 15cycles.  Follow up with Dr Pascal Lux today Most recent visit 04/2017  Pt doing quite well Denies abd pain Denies N/V Eating well; gaining weight Denies fever;chills  Follows with Dr [name] monthly-- last seen just 10/10/17 CT 10/08/17: IMPRESSION: 1. Stable CT appearance of the chest, abdomen and pelvis. Numerous stable pulmonary nodules without new or progressive findings. Stable hepatic metastatic lesions without new or progressive findings. 2. Stable advanced atherosclerotic calcifications involving the thoracic and abdominal aorta and branch vessels. 3. Stable fusiform aneurysmal dilatation of the ascending aorta with maximum measurement of 4.2 cm.    Past Medical History:  Diagnosis Date  . Acute respiratory failure with hypoxia ([place]) 10/23/2015  . Allergic rhinitis    PT. DENIES  . Anxiety   . Arthritis    NECK  . Ataxia   . Bradycardia    primarily nocturnal  . Burning tongue syndrome 25 years  . Cancer ([place]) dx'd 06/2016   liver  . Cataract   . Cerebellar degeneration   . Chronic pericarditis   . Chronic urinary tract infection   . Complication of anesthesia    low o2 sats, coded 30 years ago  . CVA (cerebral infarction) 05/2003  . Depression   . Encounter for antineoplastic chemotherapy 10/09/2016  .  Gait disorder   . Gastric polyps   . GERD (gastroesophageal reflux disease)   . Goals of care, counseling/discussion 10/09/2016  . High cholesterol   . Hyperlipidemia   . Hypertension   . Hypertension   . Hypotension   . Hypothyroidism   . IBS (irritable bowel syndrome)   . Obesity   . Paroxysmal atrial fibrillation ([name])    chads2vasc score is 6,  she is felt to be a poor candidate for anticoagulation  . Personal history of arterial venous malformation (AVM)    right side of face  . Seizure disorder ([place])   . Seizures ([place]) 2003   " smelling"- Gabapentin "no problem"  . Shortness of breath dyspnea    with exertion  . Sternum fx 10/27/2013  . Stroke [facility]) 5 years ago   Right side of face weak, slurred speach-   . Thyroid disease   . TIA (transient ischemic attack)   . UTI (lower urinary tract infection) 03/27/2016   "frequently"    Past Surgical History:  Procedure Laterality Date  . APPENDECTOMY  84 years old  . COLONOSCOPY  2006, 2009  . COLONOSCOPY WITH PROPOFOL N/A 03/28/2016   Procedure: COLONOSCOPY WITH PROPOFOL;  Surgeon: Gatha Mayer, MD;  Location: [facility];  Service: Endoscopy;  Laterality: N/A;  . cyst removed  35 years ago  . EP IMPLANTABLE DEVICE N/A 01/06/2015   Procedure: Loop Recorder Insertion;  Surgeon: Thompson Grayer, MD;  Location: [facility];  Service: Cardiovascular;  Laterality: N/A;  . EYE SURGERY Right    Cataract  . IR  [place] ADDITIONAL VESSEL  11/28/2016  . IR ANGIOGRAM SELECTIVE EACH ADDITIONAL VESSEL  11/28/2016  . IR ANGIOGRAM SELECTIVE EACH ADDITIONAL VESSEL  11/28/2016  . IR ANGIOGRAM SELECTIVE EACH ADDITIONAL VESSEL  11/28/2016  . IR ANGIOGRAM SELECTIVE EACH ADDITIONAL VESSEL  12/13/2016  . IR ANGIOGRAM SELECTIVE EACH ADDITIONAL VESSEL  12/13/2016  . IR ANGIOGRAM SELECTIVE EACH ADDITIONAL VESSEL  01/09/2017  . IR ANGIOGRAM VISCERAL SELECTIVE  11/28/2016  . IR ANGIOGRAM VISCERAL SELECTIVE  11/28/2016  . IR ANGIOGRAM VISCERAL  SELECTIVE  12/13/2016  . IR ANGIOGRAM VISCERAL SELECTIVE  12/13/2016  . IR ANGIOGRAM VISCERAL SELECTIVE  01/09/2017  . IR EMBO ARTERIAL NOT HEMORR HEMANG INC GUIDE ROADMAPPING  11/28/2016  . IR EMBO TUMOR ORGAN ISCHEMIA INFARCT INC GUIDE ROADMAPPING  12/13/2016  . IR EMBO TUMOR ORGAN ISCHEMIA INFARCT INC GUIDE ROADMAPPING  01/09/2017  . IR IVC FILTER PLMT / S&I /IMG GUID/MOD SED  04/24/2017  . IR RADIOLOGIST EVAL & MGMT  11/06/2016  . IR RADIOLOGIST EVAL & MGMT  01/02/2017  . IR RADIOLOGIST EVAL & MGMT  02/05/2017  . IR RADIOLOGIST EVAL & MGMT  05/02/2017  . IR US GUIDE VASC ACCESS RIGHT  11/28/2016  . IR US GUIDE VASC ACCESS RIGHT  12/13/2016  . IR US GUIDE VASC ACCESS RIGHT  01/09/2017  . KNEE ARTHROSCOPY Right 11/14/2006  . KNEE ARTHROSCOPY Bilateral 5 and 6 years ago  . KNEE ARTHROSCOPY WITH LATERAL MENISECTOMY  07/03/2012   Procedure: KNEE ARTHROSCOPY WITH LATERAL MENISECTOMY;  Surgeon: Magnus Sinning, MD;  Location: [facility];  Service: Orthopedics;  Laterality: Left;  with Partial Lateral Menisectomy and Medial Menisectomy. Shaving of medial and lateral femoral condyles. Shaving of patella. Removal of a loose body  . tibial and fibular internal fixation Left   . TOTAL ABDOMINAL HYSTERECTOMY  84 years old  . UPPER GASTROINTESTINAL ENDOSCOPY  2009, 2013    Allergies: Lisinopril  Medications: Prior to Admission medications   Medication Sig Start Date End Date Taking? Authorizing Provider  amitriptyline (ELAVIL) 25 MG tablet Take 1 tablet (25 mg total) by mouth at bedtime. 08/08/17   Eustaquio Maize, MD  capecitabine (XELODA) 500 MG tablet TAKE 3 TABLETS (1,500 MG TOTAL) BY MOUTH 2 (TWO) TIMES DAILY AFTER A MEAL. Patient not taking: Reported on 10/10/2017 09/06/17   Curt Bears, MD  escitalopram (LEXAPRO) 20 MG tablet Take 1 tablet (20 mg total) by mouth at bedtime. 08/08/17   Eustaquio Maize, MD  fluticasone (FLONASE) 50 MCG/ACT nasal spray Place 2 sprays into both nostrils at bedtime. 08/08/17    Eustaquio Maize, MD  furosemide (LASIX) 20 MG tablet TAKE 1 TABLET DAILY AS NEEDED 10/14/17   Eustaquio Maize, MD  gabapentin (NEURONTIN) 400 MG capsule Take 1-2 capsules (400-800 mg total) by mouth 3 (three) times daily. 08/08/17   Eustaquio Maize, MD  hydroxypropyl methylcellulose / hypromellose (ISOPTO TEARS / GONIOVISC) 2.5 % ophthalmic solution Place 1 drop into both eyes at bedtime.    [provider]  levothyroxine (SYNTHROID, LEVOTHROID) 75 MCG tablet Take 1 tablet (75 mcg total) by mouth daily before breakfast. 08/08/17   Eustaquio Maize, MD  lovastatin (MEVACOR) 40 MG tablet Take 1 tablet (40 mg total) by mouth at bedtime. 08/08/17   Eustaquio Maize, MD  Melatonin 3 MG TBDP Take 3-6 mg by mouth at bedtime as needed. 08/02/16   Eustaquio Maize, MD  omeprazole (PRILOSEC) 40 MG capsule Take 1 capsule (40 mg total)  by mouth daily. 08/08/17   Eustaquio Maize, MD  ondansetron (ZOFRAN) 8 MG tablet Take 1 tablet (8 mg total) by mouth every 8 (eight) hours as needed for nausea or vomiting. 08/08/17   Eustaquio Maize, MD  oxyCODONE-acetaminophen (PERCOCET) 5-325 MG tablet Take 1 tablet by mouth every 8 (eight) hours as needed. Patient not taking: Reported on 10/10/2017 08/13/17   Curt Bears, MD  temozolomide (TEMODAR) 140 MG capsule TAKE 2 CAPSULES BY MOUTH DAILY. MAY TAKE ON AN EMPTY STOMACH OR AT BEDTIME TO DECREASE NAUSEA AND VOMITING Patient not taking: Reported on 10/10/2017 09/06/17   Curt Bears, MD  Vitamin D, Ergocalciferol, (DRISDOL) 50000 units CAPS capsule TAKE 1 CAPSULE EVERY 7 DAYS 02/08/17   Eustaquio Maize, MD  XARELTO 20 MG TABS tablet TAKE 1 TABLET DAILY WITH SUPPER 08/28/17   Curt Bears, MD     Family History  Problem Relation Age of Onset  . Heart attack Father 74       fatal  . Coronary artery disease Brother   . Diabetes Brother   . Prostate cancer Brother   . Prostate cancer Son     Social History   Socioeconomic History  . Marital status:  Married    Spouse name: Not on file  . Number of children: Not on file  . Years of education: Not on file  . Highest education level: Not on file  Occupational History  . Not on file  Social Needs  . Financial resource strain: Not on file  . Food insecurity:    Worry: Not on file    Inability: Not on file  . Transportation needs:    Medical: Not on file    Non-medical: Not on file  Tobacco Use  . Smoking status: Former Smoker    Packs/day: 0.25    Years: 10.00    Pack years: 2.50    Types: Cigarettes    Last attempt to quit: 07/31/1975    Years since quitting: 42.2  . Smokeless tobacco: Never Used  Substance and Sexual Activity  . Alcohol use: No    Comment: Rare- maybe a drink ever 2 years  . Drug use: No  . Sexual activity: Not on file  Lifestyle  . Physical activity:    Days per week: Not on file    Minutes per session: Not on file  . Stress: Not on file  Relationships  . Social connections:    Talks on phone: Not on file    Gets together: Not on file    Attends religious service: Not on file    Active member of club or organization: Not on file    Attends meetings of clubs or organizations: Not on file    Relationship status: Not on file  Other Topics Concern  . Not on file  Social History Narrative   Lives with husband, does have stairs, does not use them. Pt completed 10th grade.     Vital Signs: BP (!) 147/69   Pulse 75   Temp (!) 97.4 F (36.3 C) (Oral)   Resp 16   Ht 5\' 4"  (1.626 m)   Wt 225 lb (102.1 kg)   SpO2 100%   BMI 38.62 kg/m   Physical Exam  Constitutional: She is oriented to person, place, and time.  Cardiovascular: Normal rate and regular rhythm.  Pulmonary/Chest: Effort normal and breath sounds normal.  Abdominal: Soft. Bowel sounds are normal.  Neurological: She is alert and oriented to person,  place, and time.  Skin: Skin is warm and dry.  Psychiatric: She has a normal mood and affect. Her behavior is normal.  Nursing note and  vitals reviewed.   Imaging: No results found.  Labs:  CBC: Recent Labs    06/10/17 1317 07/10/17 1142 08/13/17 1425 08/22/17 1653 09/12/17 1416 10/08/17 1356  WBC 3.7* 3.1* 3.5* 5.0 3.8* 3.7*  HGB 12.3 12.4 12.7 13.1  --   --   HCT 37.8 37.4 38.2 38.3 37.3 38.2  PLT 116* 146 124* 154 100* 100*    COAGS: Recent Labs    11/28/16 0743 01/09/17 0744 04/23/17 2128  INR 1.04 0.98 1.07  APTT  --   --  27    BMP: Recent Labs    08/13/17 1425 08/22/17 1653 09/12/17 1416 10/08/17 1356  NA 134* 137 137 136  K 4.1 5.5* 4.9 5.4*  CL 99 100 103 102  CO2 27 23 26 27   GLUCOSE 99 123* 99 102  BUN 13 13 16 12   CALCIUM 9.7 9.8 9.7 10.4  CREATININE 1.00 1.04* 0.90 0.93  GFRNONAA 51* 51* 58* 56*  GFRAA 60* 58* >60 >60    LIVER FUNCTION TESTS: Recent Labs    08/13/17 1425 08/22/17 1653 09/12/17 1416 10/08/17 1356  BILITOT 0.6 0.4 0.7 0.7  AST 27 28 28 25   ALT 19 18 17 16   ALKPHOS 103 93 93 95  PROT 7.0 6.6 6.7 7.0  ALBUMIN 3.7 4.0 3.6 3.7    Assessment:  Y90 procedure x 2 Right liver lesion 11/2016 Left liver lesion 12/2016 Doing well Dr Pascal Lux has seen and reviewed recent imaging and reports to pt the findings Answers all questions answered to satisfaction To follow with Dr [name]--- he will call us if need. She and husband have good understanding of plan  Signed: Tarini Carrier A, PA-C 10/17/2017, 3:54 PM   Please refer to Dr. Pascal Lux attestation of this note for management and plan.

## 2017-10-18 ENCOUNTER — Other Ambulatory Visit: Payer: Self-pay | Admitting: *Deleted

## 2017-10-18 NOTE — Telephone Encounter (Signed)
Take OTC vitamin D 1000-2000iu daily, does not need prescription strength.

## 2017-10-18 NOTE — Telephone Encounter (Signed)
Patient aware to take OTC vitd 1000-2000 iu

## 2017-10-23 ENCOUNTER — Other Ambulatory Visit: Payer: Self-pay

## 2017-10-24 ENCOUNTER — Telehealth: Payer: Self-pay

## 2017-10-24 NOTE — Telephone Encounter (Signed)
Oral Chemotherapy Pharmacist Encounter  Follow-Up Form  Called patient today to follow up regarding patient's oral chemotherapy medication: Temodar and Xeloda  Original Start date of oral chemotherapy: 10/19/2016  Pt reports zero tablets/doses missed in the last 7 days of Xeloda 500 mg tablet, 3 tablets (1,500 mg) by mouth twice a day. Xeloda is taken on days 1-14 each 28 day cycle.   Pt reports zero tablets/doses missed in the last 7 days of Temodar 140 mg capsule, 2 capsules (280 mg) by mouth once daily. Temodar is taken on days 10-14 of each 28 day cycle.   Pt reports the following side effects:  Fatigue: Patient reports fatigue has increased, and she finds that she cannot finish many of her activities without resting. Patient mentioned that some days she does not go downstairs as she is in a wheelchair and it is very tiring to move around. Patient is agreeable to continue current treatment at this time.   Pertinent labs reviewed: appropriate to continue therapy. Noted chronic, stable thrombocytopenia, will continue to monitor.   Patient knows to call the office with questions or concerns. [facility] will continue to follow.  Thank you,  Phillis Knack, PharmD Candidate 10/24/2017 1:35 PM [facility] [phone]

## 2017-10-28 ENCOUNTER — Ambulatory Visit (INDEPENDENT_AMBULATORY_CARE_PROVIDER_SITE_OTHER): Payer: Medicare Other | Admitting: *Deleted

## 2017-10-28 DIAGNOSIS — R002 Palpitations: Secondary | ICD-10-CM

## 2017-10-29 NOTE — Progress Notes (Signed)
[facility] Summary Reprot / Loop Recorder 

## 2017-11-02 LAB — CUP PACEART REMOTE DEVICE CHECK
Implantable Pulse Generator Implant Date: 20160609
MDC IDC SESS DTM: 20190228003854

## 2017-11-05 ENCOUNTER — Inpatient Hospital Stay: Payer: Medicare Other | Attending: Internal Medicine | Admitting: Internal Medicine

## 2017-11-05 ENCOUNTER — Telehealth: Payer: Self-pay

## 2017-11-05 ENCOUNTER — Encounter: Payer: Self-pay | Admitting: Internal Medicine

## 2017-11-05 ENCOUNTER — Inpatient Hospital Stay: Payer: Medicare Other

## 2017-11-05 DIAGNOSIS — Z79899 Other long term (current) drug therapy: Secondary | ICD-10-CM | POA: Diagnosis not present

## 2017-11-05 DIAGNOSIS — C7A8 Other malignant neuroendocrine tumors: Secondary | ICD-10-CM | POA: Insufficient documentation

## 2017-11-05 DIAGNOSIS — C787 Secondary malignant neoplasm of liver and intrahepatic bile duct: Secondary | ICD-10-CM | POA: Diagnosis not present

## 2017-11-05 DIAGNOSIS — Z5111 Encounter for antineoplastic chemotherapy: Secondary | ICD-10-CM

## 2017-11-05 DIAGNOSIS — I2699 Other pulmonary embolism without acute cor pulmonale: Secondary | ICD-10-CM | POA: Diagnosis not present

## 2017-11-05 DIAGNOSIS — I10 Essential (primary) hypertension: Secondary | ICD-10-CM

## 2017-11-05 DIAGNOSIS — Z7901 Long term (current) use of anticoagulants: Secondary | ICD-10-CM | POA: Diagnosis not present

## 2017-11-05 DIAGNOSIS — C7B8 Other secondary neuroendocrine tumors: Principal | ICD-10-CM

## 2017-11-05 LAB — CMP (CANCER CENTER ONLY)
ALK PHOS: 90 U/L (ref 40–150)
ALT: 17 U/L (ref 0–55)
AST: 26 U/L (ref 5–34)
Albumin: 3.7 g/dL (ref 3.5–5.0)
Anion gap: 7 (ref 3–11)
BILIRUBIN TOTAL: 0.6 mg/dL (ref 0.2–1.2)
BUN: 14 mg/dL (ref 7–26)
CALCIUM: 9.9 mg/dL (ref 8.4–10.4)
CO2: 24 mmol/L (ref 22–29)
CREATININE: 0.99 mg/dL (ref 0.60–1.10)
Chloride: 104 mmol/L (ref 98–109)
GFR, EST NON AFRICAN AMERICAN: 52 mL/min — AB (ref >60)
GFR, Est AFR Am: >60 mL/min (ref >60)
GLUCOSE: 99 mg/dL (ref 70–140)
POTASSIUM: 4.5 mmol/L (ref 3.5–5.1)
Sodium: 135 mmol/L — ABNORMAL LOW (ref 136–145)
TOTAL PROTEIN: 6.9 g/dL (ref 6.4–8.3)

## 2017-11-05 LAB — CBC WITH DIFFERENTIAL (CANCER CENTER ONLY)
BASOS ABS: 0 10*3/uL (ref 0.0–0.1)
BASOS PCT: 1 %
EOS ABS: 0.2 10*3/uL (ref 0.0–0.5)
EOS PCT: 6 %
HCT: 38.1 % (ref 34.8–46.6)
Hemoglobin: 12.7 g/dL (ref 11.6–15.9)
Lymphocytes Relative: 25 %
Lymphs Abs: 0.9 10*3/uL (ref 0.9–3.3)
MCH: 34.5 pg — ABNORMAL HIGH (ref 25.1–34.0)
MCHC: 33.3 g/dL (ref 31.5–36.0)
MCV: 103.5 fL — ABNORMAL HIGH (ref 79.5–101.0)
MONO ABS: 0.8 10*3/uL (ref 0.1–0.9)
Monocytes Relative: 23 %
Neutro Abs: 1.6 10*3/uL (ref 1.5–6.5)
Neutrophils Relative %: 45 %
PLATELETS: 96 10*3/uL — AB (ref 145–400)
RBC: 3.68 MIL/uL — AB (ref 3.70–5.45)
RDW: 16.7 % — AB (ref 11.2–14.5)
WBC: 3.5 10*3/uL — AB (ref 3.9–10.3)

## 2017-11-05 MED ORDER — TEMOZOLOMIDE 140 MG PO CAPS: ORAL_CAPSULE | ORAL | 2 refills | Status: DC

## 2017-11-05 MED ORDER — CAPECITABINE 500 MG PO TABS
ORAL_TABLET | ORAL | 2 refills | Status: DC
Start: 2017-11-05 — End: 2018-02-04

## 2017-11-05 MED FILL — TEMOZOLOMIDE 140 MG CAPSULE: 140 | 28 days supply | Qty: 10 | Fill #2

## 2017-11-05 MED FILL — CAPECITABINE 500 MG TABLET: 500 | 14 days supply | Qty: 84 | Fill #2

## 2017-11-05 NOTE — Telephone Encounter (Signed)
Printed avs and calender of upcoming appointment. Per 4/9 los 

## 2017-11-05 NOTE — Progress Notes (Signed)
[facility] [phone]   [phone]  OFFICE PROGRESS NOTE  [name], [place] [address]  DIAGNOSIS:  1) Metastatic intermediate. Neuroendocrine tumor of lung primary diagnosed in January 2018 and presented with small bilateral pulmonary nodules in addition to multiple liver metastasis. 2) right lower lobe pulmonary embolism diagnosed incidentally on CT scan of the chest on 04/23/2017  PRIOR THERAPY:  1) Status post radio embolization with Y 90 to the liver lesions by interventional radiology. 2) status post IVC filter placement by interventional radiology on 04/24/2017  CURRENT THERAPY: Xeloda 750 MG/M2 twice a day days 1-14 and Temodar 150 MG/M2 days 10-14 every 4 weeks. Status post 13 cycles. She will start cycle #17 on November 10, 2017.  INTERVAL HISTORY: LEOCADIA IDLEMAN 84 y.o. female returns to the clinic today for follow-up visit accompanied by her husband.  The patient is feeling fine today with no specific complaints except for generalized fatigue.  She has been tolerating her treatment with Xeloda and Temodar fairly well.  She is expected to start cycle #17 on November 10, 2017.  She denied having any chest pain, shortness of breath, cough or hemoptysis.  She denied having any fever or chills.  She has no nausea, vomiting, diarrhea or constipation.  She has no recent weight loss or night sweats.  She is here today for evaluation and repeat blood work.   MEDICAL HISTORY: Past Medical History:  Diagnosis Date  . Acute respiratory failure with hypoxia ([facility]) 10/23/2015  . Allergic rhinitis    PT. DENIES  . Anxiety   . Arthritis    NECK  . Ataxia   . Bradycardia    primarily nocturnal  . Burning tongue syndrome 25 years  . Cancer ([facility]) dx'd 06/2016   liver  . Cataract   . Cerebellar degeneration   . Chronic pericarditis   . Chronic urinary tract infection   . Complication of anesthesia    low o2 sats, coded  30 years ago  . CVA (cerebral infarction) 05/2003  . Depression   . Encounter for antineoplastic chemotherapy 10/09/2016  . Gait disorder   . Gastric polyps   . GERD (gastroesophageal reflux disease)   . Goals of care, counseling/discussion 10/09/2016  . High cholesterol   . Hyperlipidemia   . Hypertension   . Hypertension   . Hypotension   . Hypothyroidism   . IBS (irritable bowel syndrome)   . Obesity   . Paroxysmal atrial fibrillation ([name])    chads2vasc score is 6,  she is felt to be a poor candidate for anticoagulation  . Personal history of arterial venous malformation (AVM)    right side of face  . Seizure disorder ([facility])   . Seizures ([facility]) 2003   " smelling"- Gabapentin "no problem"  . Shortness of breath dyspnea    with exertion  . Sternum fx 10/27/2013  . Stroke [facility]) 5 years ago   Right side of face weak, slurred speach-   . Thyroid disease   . TIA (transient ischemic attack)   . UTI (lower urinary tract infection) 03/27/2016   "frequently"    ALLERGIES:  is allergic to lisinopril.  MEDICATIONS:  Current Outpatient Medications  Medication Sig Dispense Refill  . amitriptyline (ELAVIL) 25 MG tablet Take 1 tablet (25 mg total) by mouth at bedtime. 90 tablet 1  . capecitabine (XELODA) 500 MG tablet TAKE 3 TABLETS (1,500 MG TOTAL) BY MOUTH 2 (  TWO) TIMES DAILY AFTER A MEAL. 84 tablet 2  . escitalopram (LEXAPRO) 20 MG tablet Take 1 tablet (20 mg total) by mouth at bedtime. 90 tablet 1  . fluticasone (FLONASE) 50 MCG/ACT nasal spray Place 2 sprays into both nostrils at bedtime. 16 g 3  . furosemide (LASIX) 20 MG tablet TAKE 1 TABLET DAILY AS NEEDED 90 tablet 0  . gabapentin (NEURONTIN) 400 MG capsule Take 1-2 capsules (400-800 mg total) by mouth 3 (three) times daily. 720 capsule 1  . hydroxypropyl methylcellulose / hypromellose (ISOPTO TEARS / GONIOVISC) 2.5 % ophthalmic solution Place 1 drop into both eyes at bedtime.    Marland Kitchen levothyroxine (SYNTHROID, LEVOTHROID) 75 MCG  tablet Take 1 tablet (75 mcg total) by mouth daily before breakfast. 90 tablet 1  . lovastatin (MEVACOR) 40 MG tablet Take 1 tablet (40 mg total) by mouth at bedtime. 90 tablet 1  . Melatonin 3 MG TBDP Take 3-6 mg by mouth at bedtime as needed. 60 tablet 1  . omeprazole (PRILOSEC) 40 MG capsule Take 1 capsule (40 mg total) by mouth daily. 90 capsule 1  . ondansetron (ZOFRAN) 8 MG tablet Take 1 tablet (8 mg total) by mouth every 8 (eight) hours as needed for nausea or vomiting. 90 tablet 1  . oxyCODONE-acetaminophen (PERCOCET) 5-325 MG tablet Take 1 tablet by mouth every 8 (eight) hours as needed. (Patient not taking: Reported on 10/10/2017) 30 tablet 0  . temozolomide (TEMODAR) 140 MG capsule TAKE 2 CAPSULES BY MOUTH DAILY. MAY TAKE ON AN EMPTY STOMACH OR AT BEDTIME TO DECREASE NAUSEA AND VOMITING 10 capsule 2  . Vitamin D, Ergocalciferol, (DRISDOL) 50000 units CAPS capsule TAKE 1 CAPSULE EVERY 7 DAYS 12 capsule 0  . XARELTO 20 MG TABS tablet TAKE 1 TABLET DAILY WITH SUPPER 90 tablet 0   No current facility-administered medications for this visit.     SURGICAL HISTORY:  Past Surgical History:  Procedure Laterality Date  . APPENDECTOMY  84 years old  . COLONOSCOPY  2006, 2009  . COLONOSCOPY WITH PROPOFOL N/A 03/28/2016   Procedure: COLONOSCOPY WITH PROPOFOL;  Surgeon: Gatha Mayer, MD;  Location: [facility];  Service: Endoscopy;  Laterality: N/A;  . cyst removed  35 years ago  . EP IMPLANTABLE DEVICE N/A 01/06/2015   Procedure: Loop Recorder Insertion;  Surgeon: Thompson Grayer, MD;  Location: [facility];  Service: Cardiovascular;  Laterality: N/A;  . EYE SURGERY Right    Cataract  . IR ANGIOGRAM SELECTIVE EACH ADDITIONAL VESSEL  11/28/2016  . IR ANGIOGRAM SELECTIVE EACH ADDITIONAL VESSEL  11/28/2016  . IR ANGIOGRAM SELECTIVE EACH ADDITIONAL VESSEL  11/28/2016  . IR ANGIOGRAM SELECTIVE EACH ADDITIONAL VESSEL  11/28/2016  . IR ANGIOGRAM SELECTIVE EACH ADDITIONAL VESSEL  12/13/2016  . IR  ANGIOGRAM SELECTIVE EACH ADDITIONAL VESSEL  12/13/2016  . IR ANGIOGRAM SELECTIVE EACH ADDITIONAL VESSEL  01/09/2017  . IR ANGIOGRAM VISCERAL SELECTIVE  11/28/2016  . IR ANGIOGRAM VISCERAL SELECTIVE  11/28/2016  . IR ANGIOGRAM VISCERAL SELECTIVE  12/13/2016  . IR ANGIOGRAM VISCERAL SELECTIVE  12/13/2016  . IR ANGIOGRAM VISCERAL SELECTIVE  01/09/2017  . IR EMBO ARTERIAL NOT HEMORR HEMANG INC GUIDE ROADMAPPING  11/28/2016  . IR EMBO TUMOR ORGAN ISCHEMIA INFARCT INC GUIDE ROADMAPPING  12/13/2016  . IR EMBO TUMOR ORGAN ISCHEMIA INFARCT INC GUIDE ROADMAPPING  01/09/2017  . IR IVC FILTER PLMT / S&I /IMG GUID/MOD SED  04/24/2017  . IR RADIOLOGIST EVAL & MGMT  11/06/2016  . IR RADIOLOGIST EVAL &  MGMT  01/02/2017  . IR RADIOLOGIST EVAL & MGMT  02/05/2017  . IR RADIOLOGIST EVAL & MGMT  05/02/2017  . IR RADIOLOGIST EVAL & MGMT  10/17/2017  . IR US GUIDE VASC ACCESS RIGHT  11/28/2016  . IR US GUIDE VASC ACCESS RIGHT  12/13/2016  . IR US GUIDE VASC ACCESS RIGHT  01/09/2017  . KNEE ARTHROSCOPY Right 11/14/2006  . KNEE ARTHROSCOPY Bilateral 5 and 6 years ago  . KNEE ARTHROSCOPY WITH LATERAL MENISECTOMY  07/03/2012   Procedure: KNEE ARTHROSCOPY WITH LATERAL MENISECTOMY;  Surgeon: Magnus Sinning, MD;  Location: [facility];  Service: Orthopedics;  Laterality: Left;  with Partial Lateral Menisectomy and Medial Menisectomy. Shaving of medial and lateral femoral condyles. Shaving of patella. Removal of a loose body  . tibial and fibular internal fixation Left   . TOTAL ABDOMINAL HYSTERECTOMY  84 years old  . UPPER GASTROINTESTINAL ENDOSCOPY  2009, 2013    REVIEW OF SYSTEMS:  A comprehensive review of systems was negative except for: Constitutional: positive for fatigue   PHYSICAL EXAMINATION: General appearance: alert, cooperative, fatigued and no distress Head: Normocephalic, without obvious abnormality, atraumatic Neck: no adenopathy, no JVD, supple, symmetrical, trachea midline and thyroid not enlarged, symmetric, no  tenderness/mass/nodules Lymph nodes: Cervical, supraclavicular, and axillary nodes normal. Resp: clear to auscultation bilaterally Back: symmetric, no curvature. ROM normal. No CVA tenderness. Cardio: regular rate and rhythm, S1, S2 normal, no murmur, click, rub or gallop GI: soft, non-tender; bowel sounds normal; no masses,  no organomegaly Extremities: extremities normal, atraumatic, no cyanosis or edema  ECOG PERFORMANCE STATUS: 1 - Symptomatic but completely ambulatory  Blood pressure 129/62, pulse 82, temperature 98 F (36.7 C), temperature source Oral, resp. rate 18, height 5\' 4"  (1.626 m), weight 227 lb 9.6 oz (103.2 kg), SpO2 98 %.  LABORATORY DATA: Lab Results  Component Value Date   WBC 3.5 (L) 11/05/2017   HGB 13.1 08/22/2017   HCT 38.1 11/05/2017   MCV 103.5 (H) 11/05/2017   PLT 96 (L) 11/05/2017      Chemistry      Component Value Date/Time   NA 136 10/08/2017 1356   NA 137 08/22/2017 1653   NA 135 (L) 07/10/2017 1142   K 5.4 (H) 10/08/2017 1356   K 4.6 07/10/2017 1142   CL 102 10/08/2017 1356   CO2 27 10/08/2017 1356   CO2 25 07/10/2017 1142   BUN 12 10/08/2017 1356   BUN 13 08/22/2017 1653   BUN 11.4 07/10/2017 1142   CREATININE 0.93 10/08/2017 1356   CREATININE 1.0 07/10/2017 1142      Component Value Date/Time   CALCIUM 10.4 10/08/2017 1356   CALCIUM 9.9 07/10/2017 1142   ALKPHOS 95 10/08/2017 1356   ALKPHOS 93 07/10/2017 1142   AST 25 10/08/2017 1356   AST 43 (H) 07/10/2017 1142   ALT 16 10/08/2017 1356   ALT 28 07/10/2017 1142   BILITOT 0.7 10/08/2017 1356   BILITOT 0.56 07/10/2017 1142       RADIOGRAPHIC STUDIES: Ct Chest W Contrast  Result Date: 10/09/2017 CLINICAL DATA:  Metastatic neuroendocrine tumor status post Y 90 treatment for liver lesions. EXAM: CT CHEST, ABDOMEN, AND PELVIS WITH CONTRAST TECHNIQUE: Multidetector CT imaging of the chest, abdomen and pelvis was performed following the standard protocol during bolus administration  of intravenous contrast. CONTRAST:  33mL ISOVUE-300 IOPAMIDOL (ISOVUE-300) INJECTION 61% COMPARISON:  09/10/2016 FINDINGS: CT CHEST FINDINGS Cardiovascular: The heart is normal in size. No pericardial effusion. The aorta demonstrates stable  atherosclerotic calcifications and tortuosity but no dissection. Stable mild aneurysmal dilatation of the ascending aorta with maximum measurement of 4 cm. Stable mild pulmonary artery enlargement which may suggest pulmonary hypertension. Stable three-vessel coronary artery calcifications. Mediastinum/Nodes: No mediastinal or hilar mass or lymphadenopathy. Small scattered lymph nodes are stable. The esophagus is grossly normal. Lungs/Pleura: There are numerous sub 4 mm bilateral pulmonary nodules which appears stable. The right upper lobe lesion on image number 34 measures 12 mm and is stable. 4 mm left lower lobe pulmonary nodule along the major fissure is also stable. No new pulmonary lesions. No acute overlying pulmonary process.  No pleural effusion. Musculoskeletal: No breast masses, supraclavicular or axillary lymphadenopathy. A loop recorder is noted in the left chest wall. The bony structures are unremarkable and stable. Remote healed sternal fracture again noted. CT ABDOMEN PELVIS FINDINGS Hepatobiliary: Stable hepatic metastatic lesions. 19 x 15 mm peripheral segment 8 lesion is unchanged. 12.5 mm segment 2 lesion is unchanged. 24 x 20 mm segment 3 lesion is stable. No new hepatic lesions. Soft tissue debris noted in the fundal region of the gallbladder which could be focal adenomyomatosis or gallbladder sludge. No intrahepatic biliary dilatation. No common bile duct dilatation. Pancreas: No mass, inflammation or ductal dilatation. Spleen: Normal size.  No focal lesions. Adrenals/Urinary Tract: The adrenal glands and kidneys are unremarkable. Small lower pole left renal calculus is stable. Stomach/Bowel: The stomach, duodenum, small bowel and colon are unremarkable. No  acute inflammatory changes, mass lesions or obstructive findings. The terminal ileum is normal. The appendix is not identified. Vascular/Lymphatic: Stable advanced atherosclerotic calcifications involving the aorta and iliac arteries. No aneurysm. The major venous structures are patent. An IVC filter is again demonstrated. No mesenteric or retroperitoneal mass or adenopathy. Reproductive: The uterus is surgically absent. The right ovary is still present and contains a small cyst. Other: No pelvic mass or adenopathy. No free pelvic fluid collections. No inguinal mass or adenopathy. No abdominal wall hernia or subcutaneous lesions. Musculoskeletal: No significant bony findings. IMPRESSION: 1. Stable CT appearance of the chest, abdomen and pelvis. Numerous stable pulmonary nodules without new or progressive findings. Stable hepatic metastatic lesions without new or progressive findings. 2. Stable advanced atherosclerotic calcifications involving the thoracic and abdominal aorta and branch vessels. 3. Stable fusiform aneurysmal dilatation of the ascending aorta with maximum measurement of 4.2 cm. Electronically Signed   By: Marijo Sanes M.D.   On: 10/09/2017 08:36   Ct Abdomen Pelvis W Contrast  Result Date: 10/09/2017 CLINICAL DATA:  Metastatic neuroendocrine tumor status post Y 90 treatment for liver lesions. EXAM: CT CHEST, ABDOMEN, AND PELVIS WITH CONTRAST TECHNIQUE: Multidetector CT imaging of the chest, abdomen and pelvis was performed following the standard protocol during bolus administration of intravenous contrast. CONTRAST:  100mL ISOVUE-300 IOPAMIDOL (ISOVUE-300) INJECTION 61% COMPARISON:  09/10/2016 FINDINGS: CT CHEST FINDINGS Cardiovascular: The heart is normal in size. No pericardial effusion. The aorta demonstrates stable atherosclerotic calcifications and tortuosity but no dissection. Stable mild aneurysmal dilatation of the ascending aorta with maximum measurement of 4 cm. Stable mild pulmonary  artery enlargement which may suggest pulmonary hypertension. Stable three-vessel coronary artery calcifications. Mediastinum/Nodes: No mediastinal or hilar mass or lymphadenopathy. Small scattered lymph nodes are stable. The esophagus is grossly normal. Lungs/Pleura: There are numerous sub 4 mm bilateral pulmonary nodules which appears stable. The right upper lobe lesion on image number 34 measures 12 mm and is stable. 4 mm left lower lobe pulmonary nodule along the major fissure is also stable.  No new pulmonary lesions. No acute overlying pulmonary process.  No pleural effusion. Musculoskeletal: No breast masses, supraclavicular or axillary lymphadenopathy. A loop recorder is noted in the left chest wall. The bony structures are unremarkable and stable. Remote healed sternal fracture again noted. CT ABDOMEN PELVIS FINDINGS Hepatobiliary: Stable hepatic metastatic lesions. 19 x 15 mm peripheral segment 8 lesion is unchanged. 12.5 mm segment 2 lesion is unchanged. 24 x 20 mm segment 3 lesion is stable. No new hepatic lesions. Soft tissue debris noted in the fundal region of the gallbladder which could be focal adenomyomatosis or gallbladder sludge. No intrahepatic biliary dilatation. No common bile duct dilatation. Pancreas: No mass, inflammation or ductal dilatation. Spleen: Normal size.  No focal lesions. Adrenals/Urinary Tract: The adrenal glands and kidneys are unremarkable. Small lower pole left renal calculus is stable. Stomach/Bowel: The stomach, duodenum, small bowel and colon are unremarkable. No acute inflammatory changes, mass lesions or obstructive findings. The terminal ileum is normal. The appendix is not identified. Vascular/Lymphatic: Stable advanced atherosclerotic calcifications involving the aorta and iliac arteries. No aneurysm. The major venous structures are patent. An IVC filter is again demonstrated. No mesenteric or retroperitoneal mass or adenopathy. Reproductive: The uterus is surgically  absent. The right ovary is still present and contains a small cyst. Other: No pelvic mass or adenopathy. No free pelvic fluid collections. No inguinal mass or adenopathy. No abdominal wall hernia or subcutaneous lesions. Musculoskeletal: No significant bony findings. IMPRESSION: 1. Stable CT appearance of the chest, abdomen and pelvis. Numerous stable pulmonary nodules without new or progressive findings. Stable hepatic metastatic lesions without new or progressive findings. 2. Stable advanced atherosclerotic calcifications involving the thoracic and abdominal aorta and branch vessels. 3. Stable fusiform aneurysmal dilatation of the ascending aorta with maximum measurement of 4.2 cm. Electronically Signed   By: Marijo Sanes M.D.   On: 10/09/2017 08:36   Ir Radiologist Eval & Mgmt  Result Date: 10/17/2017 Please refer to notes tab for details about interventional procedure. (Op Note)   ASSESSMENT AND PLAN:  This is a very pleasant 84 years old white female with metastatic intermediate grade neuroendocrine carcinoma of questionable lung primary and multiple metastatic liver lesions and pancreatic lesions.She status post treatment with radio embolization with Y90 to the left and right lobe liver lesions.Status post treatment with Y 90 to the left and right lobes of the liver. She is currently undergoing systemic chemotherapy with Xeloda and Temodar status post 16 cycles. The patient continues to tolerate this treatment well with no concerning complaints. I recommended for her to proceed with cycle #17 as a schedule on November 13, 2017. I will see her back for follow-up visit in 1 month for evaluation with repeat blood work. For the pulmonary embolism, she will continue on Xarelto 20 mg p.o. Daily. She was advised to call immediately if she has any concerning symptoms in the interval. The patient voices understanding of current disease status and treatment options and is in agreement with the current care  plan. All questions were answered. The patient knows to call the clinic with any problems, questions or concerns. We can certainly see the patient much sooner if necessary.  Disclaimer: This note was dictated with voice recognition software. Similar sounding words can inadvertently be transcribed and may not be corrected upon review.

## 2017-11-06 ENCOUNTER — Encounter: Payer: Self-pay | Admitting: Pediatrics

## 2017-11-06 ENCOUNTER — Ambulatory Visit (INDEPENDENT_AMBULATORY_CARE_PROVIDER_SITE_OTHER): Payer: Medicare Other | Admitting: Pediatrics

## 2017-11-06 VITALS — BP 127/68 | HR 83 | Temp 97.7°F | Ht 64.0 in

## 2017-11-06 DIAGNOSIS — G8929 Other chronic pain: Secondary | ICD-10-CM | POA: Diagnosis not present

## 2017-11-06 DIAGNOSIS — R609 Edema, unspecified: Secondary | ICD-10-CM | POA: Diagnosis not present

## 2017-11-06 DIAGNOSIS — M25561 Pain in right knee: Secondary | ICD-10-CM | POA: Diagnosis not present

## 2017-11-06 DIAGNOSIS — M542 Cervicalgia: Secondary | ICD-10-CM | POA: Diagnosis not present

## 2017-11-06 DIAGNOSIS — E559 Vitamin D deficiency, unspecified: Secondary | ICD-10-CM | POA: Diagnosis not present

## 2017-11-06 NOTE — Progress Notes (Signed)
  Subjective:   Patient ID: [name], female    DOB: Aug 10, 1933, 84 y.o.   MRN: [id number] CC: Follow-up (3 month) Multiple medical problems HPI: [name] is a 84 y.o. female presenting for Follow-up (3 month)  Neck pain: Starting a few weeks ago she has had 5-6 episodes of sharp severe pain the last for about 5 seconds under the right side of her jaw.  It comes out of the blue.  Turning her head does not make any worse.  She says she would be able to stand if lasted for much more than the 5 seconds.  It happens 3-5 times a day.  Hypercoagulable: On a blood thinner now after an acute PE.  Vitamin D deficiency: Not taking over-the-counter vitamin D regularly right now.  Right knee: Has been having worsening pain in her right knee, especially at night when she turns in bed.  Was getting steroid injections in the orthopedics.  She would like to restart that again.  She is wheelchair-bound.  Says she has been told she says arthritis in that knee in the past.  Relevant past medical, surgical, family and social history reviewed. Allergies and medications reviewed and updated. Social History   Tobacco Use  Smoking Status Former Smoker  . Packs/day: 0.25  . Years: 10.00  . Pack years: 2.50  . Types: Cigarettes  . Last attempt to quit: 07/31/1975  . Years since quitting: 42.2  Smokeless Tobacco Never Used   ROS: Per HPI   Objective:    BP 127/68   Pulse 83   Temp 97.7 F (36.5 C) (Oral)   Ht 5\' 4"  (1.626 m)   BMI 39.07 kg/m   Wt Readings from Last 3 Encounters:  11/05/17 227 lb 9.6 oz (103.2 kg)  10/17/17 225 lb (102.1 kg)  10/10/17 228 lb 4.8 oz (103.6 kg)    Gen: NAD, alert, cooperative with exam, NCAT EYES: EOMI, no conjunctival injection, or no icterus ENT:  TMs pearly gray b/l, OP without erythema NECK: no tenderness, no carotid bruit L side LYMPH: no cervical LAD CV: NRRR, normal S1/S2, no murmur, distal pulses 2+ b/l Resp: CTABL, no wheezes, normal WOB Ext:  No edema, warm Neuro: Alert and oriented MSK: no redness R knee, no joint line ttp  Assessment & Plan:  [name] was seen today for follow-up multiple med problems.  Diagnoses and all orders for this visit:  Chronic pain of right knee -     Ambulatory referral to Orthopedic Surgery  Neck pain -     US Carotid Duplex Bilateral; Future  Vitamin D deficiency Cont w OTC vitamin d  Peripheral edema Rarely needing lasix, cont as needed  GERD Try weaning as able, start skipping a day a week. Taking daily now. Never has symptoms.  Follow up plan: Return in about 3 months (around 02/05/2018). Rebekah Found, MD [place]

## 2017-11-06 NOTE — Patient Instructions (Signed)
Take over the counter vitamin D daily, 1000-2000iu

## 2017-11-12 DIAGNOSIS — M1711 Unilateral primary osteoarthritis, right knee: Secondary | ICD-10-CM | POA: Diagnosis not present

## 2017-11-20 ENCOUNTER — Telehealth: Payer: Self-pay | Admitting: Pediatrics

## 2017-11-20 ENCOUNTER — Telehealth: Payer: Self-pay

## 2017-11-20 NOTE — Telephone Encounter (Signed)
Order for Carotid US at [facility] which is where patient wants to go is in wrong  Needs to be [id number] and they need a reason other than neck pain   Something like vertigo, carotid bruits

## 2017-11-20 NOTE — Telephone Encounter (Signed)
Patient aware that it will be worked on today.

## 2017-11-21 NOTE — Telephone Encounter (Signed)
Attempted to contact patient to see if she is still having neck pain or any dizziness.  Is neck pain any worse any better per Dr. Evette Doffing

## 2017-11-22 ENCOUNTER — Encounter: Payer: Self-pay | Admitting: Neurology

## 2017-11-22 NOTE — Telephone Encounter (Signed)
Patient returned phone call and states that she is still having neck pain/burning with no changes.

## 2017-11-26 ENCOUNTER — Other Ambulatory Visit: Payer: Self-pay | Admitting: Internal Medicine

## 2017-11-26 DIAGNOSIS — I2699 Other pulmonary embolism without acute cor pulmonale: Secondary | ICD-10-CM

## 2017-11-27 ENCOUNTER — Ambulatory Visit (INDEPENDENT_AMBULATORY_CARE_PROVIDER_SITE_OTHER): Payer: Medicare Other

## 2017-11-27 ENCOUNTER — Telehealth: Payer: Self-pay | Admitting: Cardiology

## 2017-11-27 VITALS — BP 124/62 | HR 73 | Temp 97.0°F | Ht 64.0 in | Wt 232.0 lb

## 2017-11-27 DIAGNOSIS — Z23 Encounter for immunization: Secondary | ICD-10-CM | POA: Diagnosis not present

## 2017-11-27 DIAGNOSIS — Z0000 Encounter for general adult medical examination without abnormal findings: Secondary | ICD-10-CM | POA: Diagnosis not present

## 2017-11-27 NOTE — Telephone Encounter (Signed)
Spoke w/ pt husband and requested that she send a manual transmission b/c her home monitor has not updated in at least 14 days.   

## 2017-11-27 NOTE — Patient Instructions (Signed)
  [name] , Thank you for taking time to come for your Medicare Wellness Visit. I appreciate your ongoing commitment to your health goals. Please review the following plan we discussed and let me know if I can assist you in the future.   These are the goals we discussed: Goals    . DIET - EAT MORE FRUITS AND VEGETABLES       This is a list of the screening recommended for you and due dates:  Health Maintenance  Topic Date Due  . DEXA scan (bone density measurement)  03/24/2001  . Tetanus Vaccine  11/07/2018*  . Pneumonia vaccines (2 of 2 - PPSV23) 11/07/2018*  . Flu Shot  02/27/2018  *Topic was postponed. The date shown is not the original due date.

## 2017-11-27 NOTE — Progress Notes (Addendum)
Subjective:   [name] is a 84 y.o. female who presents for an Initial Medicare Annual Wellness Visit. She is accompanied by her husband today. She is wheelchair bound and can't ambulate. She is only able to stand for short periods of time. She states that she does everything in her chair, house work, cooking, and all. Her husband helps her. 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 I son is deceased. She is currently being treated with chemotherapy for cancer. She has multiple health issues and sees multiples specialist. Most of her children live close by and she visits with them on a regular basis. She is concerned about her weight and states she has never weighed this much but she is unable to walk or do many exercises due to health. Updated her pneumonia 23 and boostrix today. Discussed the shingles vaccine and she stated that she will update this at next follow up appt.   Review of Systems      Cardiac Risk Factors include: advanced age (>43men, >59 women);dyslipidemia;hypertension;obesity (BMI >30kg/m2);sedentary lifestyle;smoking/ tobacco exposure     Objective:    Today's Vitals   11/27/17 1517  BP: 124/62  Pulse: 73  Temp: (!) 97 F (36.1 C)  TempSrc: Oral  Weight: 232 lb (105.2 kg)  Height: 5\' 4"  (1.626 m)   Body mass index is 39.82 kg/m.  Advanced Directives 11/27/2017 08/13/2017 07/15/2017 06/10/2017 04/24/2017 04/23/2017 02/18/2017  Does Patient Have a Medical Advance Directive? No No No No No No No  Type of Advance Directive - - - - - - -  Does patient want to make changes to medical advance directive? - - - - - - -  Copy of [place] in Chart? - - - - - - -  Would patient like information on creating a medical advance directive? No - Patient declined - - No - Patient declined No - Patient declined - -  Pre-existing out of facility DNR order (yellow form or pink MOST form) - - - - - - -   Patient does not have an advanced directive  and does not wish to put one in place. Husband who was with patient states that he wants to have one but she refuses.  Patient declined any information for one.  Current Medications (verified) Outpatient Encounter Medications as of 11/27/2017  Medication Sig  . amitriptyline (ELAVIL) 25 MG tablet Take 1 tablet (25 mg total) by mouth at bedtime.  . capecitabine (XELODA) 500 MG tablet TAKE 3 TABLETS (1,500 MG TOTAL) BY MOUTH 2 (TWO) TIMES DAILY AFTER A MEAL.  Marland Kitchen escitalopram (LEXAPRO) 20 MG tablet Take 1 tablet (20 mg total) by mouth at bedtime.  . fluticasone (FLONASE) 50 MCG/ACT nasal spray Place 2 sprays into both nostrils at bedtime.  . furosemide (LASIX) 20 MG tablet TAKE 1 TABLET DAILY AS NEEDED  . gabapentin (NEURONTIN) 400 MG capsule Take 1-2 capsules (400-800 mg total) by mouth 3 (three) times daily.  . hydroxypropyl methylcellulose / hypromellose (ISOPTO TEARS / GONIOVISC) 2.5 % ophthalmic solution Place 1 drop into both eyes at bedtime.  Marland Kitchen levothyroxine (SYNTHROID, LEVOTHROID) 75 MCG tablet Take 1 tablet (75 mcg total) by mouth daily before breakfast.  . lovastatin (MEVACOR) 40 MG tablet Take 1 tablet (40 mg total) by mouth at bedtime.  . Melatonin 3 MG TBDP Take 3-6 mg by mouth at bedtime as needed.  Marland Kitchen omeprazole (PRILOSEC) 40 MG capsule Take 1 capsule (40 mg  total) by mouth daily.  . ondansetron (ZOFRAN) 8 MG tablet Take 1 tablet (8 mg total) by mouth every 8 (eight) hours as needed for nausea or vomiting.  Marland Kitchen oxyCODONE-acetaminophen (PERCOCET) 5-325 MG tablet Take 1 tablet by mouth every 8 (eight) hours as needed.  . temozolomide (TEMODAR) 140 MG capsule TAKE 2 CAPSULES BY MOUTH DAILY. MAY TAKE ON AN EMPTY STOMACH OR AT BEDTIME TO DECREASE NAUSEA AND VOMITING  . XARELTO 20 MG TABS tablet TAKE 1 TABLET DAILY WITH SUPPER   No facility-administered encounter medications on file as of 11/27/2017.     Allergies (verified) Lisinopril   History: Past Medical History:  Diagnosis Date  .  Acute respiratory failure with hypoxia ([place]) 10/23/2015  . Allergic rhinitis    PT. DENIES  . Anxiety   . Arthritis    NECK  . Ataxia   . Bradycardia    primarily nocturnal  . Burning tongue syndrome 25 years  . Cancer ([place]) dx'd 06/2016   liver  . Cataract   . Cerebellar degeneration   . Chronic pericarditis   . Chronic urinary tract infection   . Complication of anesthesia    low o2 sats, coded 30 years ago  . CVA (cerebral infarction) 05/2003  . Depression   . Encounter for antineoplastic chemotherapy 10/09/2016  . Gait disorder   . Gastric polyps   . GERD (gastroesophageal reflux disease)   . Goals of care, counseling/discussion 10/09/2016  . High cholesterol   . Hyperlipidemia   . Hypotension   . Hypothyroidism   . IBS (irritable bowel syndrome)   . Obesity   . Paroxysmal atrial fibrillation ([name])    chads2vasc score is 6,  she is felt to be a poor candidate for anticoagulation  . Personal history of arterial venous malformation (AVM)    right side of face  . Seizure disorder ([place])   . Seizures ([place]) 2003   " smelling"- Gabapentin "no problem"  . Shortness of breath dyspnea    with exertion  . Sternum fx 10/27/2013  . Stroke [facility]) 5 years ago   Right side of face weak, slurred speach-   . Thyroid disease   . TIA (transient ischemic attack)   . UTI (lower urinary tract infection) 03/27/2016   "frequently"   Past Surgical History:  Procedure Laterality Date  . APPENDECTOMY  84 years old  . COLONOSCOPY  2006, 2009  . COLONOSCOPY WITH PROPOFOL N/A 03/28/2016   Procedure: COLONOSCOPY WITH PROPOFOL;  Surgeon: Gatha Mayer, MD;  Location: [facility];  Service: Endoscopy;  Laterality: N/A;  . cyst removed  35 years ago  . EP IMPLANTABLE DEVICE N/A 01/06/2015   Procedure: Loop Recorder Insertion;  Surgeon: Thompson Grayer, MD;  Location: [facility];  Service: Cardiovascular;  Laterality: N/A;  . EYE SURGERY Right    Cataract  . IR ANGIOGRAM SELECTIVE EACH  ADDITIONAL VESSEL  11/28/2016  . IR ANGIOGRAM SELECTIVE EACH ADDITIONAL VESSEL  11/28/2016  . IR ANGIOGRAM SELECTIVE EACH ADDITIONAL VESSEL  11/28/2016  . IR ANGIOGRAM SELECTIVE EACH ADDITIONAL VESSEL  11/28/2016  . IR ANGIOGRAM SELECTIVE EACH ADDITIONAL VESSEL  12/13/2016  . IR ANGIOGRAM SELECTIVE EACH ADDITIONAL VESSEL  12/13/2016  . IR ANGIOGRAM SELECTIVE EACH ADDITIONAL VESSEL  01/09/2017  . IR ANGIOGRAM VISCERAL SELECTIVE  11/28/2016  . IR ANGIOGRAM VISCERAL SELECTIVE  11/28/2016  . IR ANGIOGRAM VISCERAL SELECTIVE  12/13/2016  . IR ANGIOGRAM VISCERAL SELECTIVE  12/13/2016  . IR ANGIOGRAM VISCERAL SELECTIVE  01/09/2017  .  IR EMBO ARTERIAL NOT HEMORR HEMANG INC GUIDE ROADMAPPING  11/28/2016  . IR EMBO TUMOR ORGAN ISCHEMIA INFARCT INC GUIDE ROADMAPPING  12/13/2016  . IR EMBO TUMOR ORGAN ISCHEMIA INFARCT INC GUIDE ROADMAPPING  01/09/2017  . IR IVC FILTER PLMT / S&I /IMG GUID/MOD SED  04/24/2017  . IR RADIOLOGIST EVAL & MGMT  11/06/2016  . IR RADIOLOGIST EVAL & MGMT  01/02/2017  . IR RADIOLOGIST EVAL & MGMT  02/05/2017  . IR RADIOLOGIST EVAL & MGMT  05/02/2017  . IR RADIOLOGIST EVAL & MGMT  10/17/2017  . IR US GUIDE VASC ACCESS RIGHT  11/28/2016  . IR US GUIDE VASC ACCESS RIGHT  12/13/2016  . IR US GUIDE VASC ACCESS RIGHT  01/09/2017  . KNEE ARTHROSCOPY Right 11/14/2006  . KNEE ARTHROSCOPY Bilateral 5 and 6 years ago  . KNEE ARTHROSCOPY WITH LATERAL MENISECTOMY  07/03/2012   Procedure: KNEE ARTHROSCOPY WITH LATERAL MENISECTOMY;  Surgeon: Magnus Sinning, MD;  Location: [facility];  Service: Orthopedics;  Laterality: Left;  with Partial Lateral Menisectomy and Medial Menisectomy. Shaving of medial and lateral femoral condyles. Shaving of patella. Removal of a loose body  . tibial and fibular internal fixation Left   . TOTAL ABDOMINAL HYSTERECTOMY  84 years old  . UPPER GASTROINTESTINAL ENDOSCOPY  2009, 2013   Family History  Problem Relation Age of Onset  . Heart attack Father 63       fatal  . Coronary artery disease  Brother   . Diabetes Brother   . Prostate cancer Brother   . Prostate cancer Son    Social History   Socioeconomic History  . Marital status: Married    Spouse name: Not on file  . Number of children: 6  . Years of education: Not on file  . Highest education level: 10th grade  Occupational History  . Occupation: Agricultural engineer  Social Needs  . Financial resource strain: Not hard at all  . Food insecurity:    Worry: Never true    Inability: Never true  . Transportation needs:    Medical: No    Non-medical: No  Tobacco Use  . Smoking status: Former Smoker    Packs/day: 0.25    Years: 10.00    Pack years: 2.50    Types: Cigarettes    Last attempt to quit: 07/31/1975    Years since quitting: 42.3  . Smokeless tobacco: Never Used  Substance and Sexual Activity  . Alcohol use: No    Comment: Rare- maybe a drink ever 2 years  . Drug use: No  . Sexual activity: Not Currently  Lifestyle  . Physical activity:    Days per week: 0 days    Minutes per session: 0 min  . Stress: Not at all  Relationships  . Social connections:    Talks on phone: More than three times a week    Gets together: More than three times a week    Attends religious service: Never    Active member of club or organization: No    Attends meetings of clubs or organizations: Never    Relationship status: Married  Other Topics Concern  . Not on file  Social History Narrative   Lives with husband, does have stairs, does not use them. Pt completed 10th grade.    Tobacco Counseling Patient is a former smoker  Clinical Intake:  Pre-visit preparation completed: Yes  Pain : No/denies pain     BMI - recorded: 39.07 Nutritional Status: BMI > 30  Obese Diabetes: No  How often do you need to have someone help you when you read instructions, pamphlets, or other written materials from your doctor or pharmacy?: 1 - Never What is the last grade level you completed in school?: 10th grade  Interpreter Needed?:  No      Activities of Daily Living In your present state of health, do you have any difficulty performing the following activities: 11/27/2017 04/24/2017  Hearing? N N  Vision? Y N  Comment Wears glasses all the time -  Difficulty concentrating or making decisions? N N  Walking or climbing stairs? Y Y  Comment Unable to walk -  Dressing or bathing? Y Y  Comment Unable to walk. Needs assistance -  Doing errands, shopping? Y N  Comment Patient unable to walk -  Preparing Food and eating ? Y -  Using the Toilet? Y -  In the past six months, have you accidently leaked urine? N -  Do you have problems with loss of bowel control? N -  Managing your Medications? Y -  Managing your Finances? Y -  Housekeeping or managing your Housekeeping? Y -  Some recent data might be hidden     Immunizations and Health Maintenance Immunization History  Administered Date(s) Administered  . Influenza, High Dose Seasonal PF 04/25/2017  . Influenza,inj,Quad PF,6+ Mos 05/27/2013, 05/03/2014, 05/24/2015, 05/01/2016  . Pneumococcal Conjugate-13 05/24/2015  . Td 02/27/2005  . Zoster 10/12/2014   Health Maintenance Due  Topic Date Due  . DEXA SCAN  03/24/2001   Patient states that she has had a DEXA before but I am unsure if she could have one at this time due to health conditions.  Patient Care Team: Eustaquio Maize, MD as PCP - General (Pediatrics)  Indicate any recent Medical Services you may have received from other than [facility] providers in the past year (date may be approximate).     Assessment:   This is a routine wellness examination for [place].  Hearing/Vision screen No exam data present  Dietary issues and exercise activities discussed: Current Exercise Habits: The patient does not participate in regular exercise at present, Exercise limited by: cardiac condition(s);orthopedic condition(s);neurologic condition(s)  Goals    . DIET - EAT MORE FRUITS AND VEGETABLES      Depression  Screen PHQ 2/9 Scores 11/27/2017 11/06/2017 08/22/2017 08/08/2017 06/19/2017 05/08/2017 01/25/2017  PHQ - 2 Score 0 0 0 0 0 2 0  PHQ- 9 Score - - - - - 5 -  Exception Documentation - - - - - - -    Fall Risk Fall Risk  11/27/2017 11/06/2017 08/22/2017 08/08/2017 06/26/2017  Falls in the past year? No No No No Yes  Number falls in past yr: - - - - 2 or more  Injury with Fall? - - - - No  Risk Factor Category  - - - - -  Risk for fall due to : - - - - -  Follow up - - - - Falls evaluation completed    Is the patient's home free of loose throw rugs in walkways, pet beds, electrical cords, etc?   yes      Grab bars in the bathroom? yes      Handrails on the stairs?   yes      Adequate lighting?   yes    Cognitive Function: MMSE - Mini Mental State Exam 11/27/2017  Orientation to time 5  Orientation to Place 5  Registration 3  Attention/ Calculation  5  Recall 3  Language- name 2 objects 2  Language- repeat 1  Language- follow 3 step command 3  Language- read & follow direction 1  Write a sentence 1  Copy design 1  Total score 30    Patient has no problem completing the MMSE    Screening Tests Health Maintenance  Topic Date Due  . DEXA SCAN  03/24/2001  . TETANUS/TDAP  11/07/2018 (Originally 02/28/2015)  . PNA vac Low Risk Adult (2 of 2 - PPSV23) 11/07/2018 (Originally 05/23/2016)  . INFLUENZA VACCINE  02/27/2018    Qualifies for Shingles Vaccine?   Patient does qualify but other vaccines were given today and she wants to have shingles vaccine at next office visit  Cancer Screenings: Lung: Low Dose CT Chest recommended if Age 64-80 years, 30 pack-year currently smoking OR have quit w/in 15years. Patient does not qualify. Patient is currently receiving chemotherapy for cancer and has had multiple chest CTs Breast: Up to date on Mammogram? No   Up to date of Bone Density/Dexa? No Colorectal: Patient is out of age range  Additional Screenings: Hepatitis C Screening:   Patient does  not fall within the guildelines     Plan:     I have personally reviewed and noted the following in the patient's chart:   . Medical and social history . Use of alcohol, tobacco or illicit drugs  . Current medications and supplements . Functional ability and status . Nutritional status . Physical activity . Advanced directives . List of other physicians . Hospitalizations, surgeries, and ER visits in previous 12 months . Vitals . Screenings to include cognitive, depression, and falls . Referrals and appointments  In addition, I have reviewed and discussed with patient certain preventive protocols, quality metrics, and best practice recommendations. A written personalized care plan for preventive services as well as general preventive health recommendations were provided to patient.     Rolena Infante, LPN   09/04/4156    I have reviewed and agree with the above AWV documentation.   Assunta Found, MD [facility] 11/30/2017, 1:11 PM

## 2017-11-30 NOTE — Telephone Encounter (Signed)
Called and discussed with patient options.  We will watch and wait on further imaging for now.

## 2017-12-02 ENCOUNTER — Ambulatory Visit (INDEPENDENT_AMBULATORY_CARE_PROVIDER_SITE_OTHER): Payer: Medicare Other | Admitting: *Deleted

## 2017-12-02 DIAGNOSIS — R002 Palpitations: Secondary | ICD-10-CM

## 2017-12-02 LAB — CUP PACEART REMOTE DEVICE CHECK
Implantable Pulse Generator Implant Date: 20160609
MDC IDC SESS DTM: 20190402003510

## 2017-12-02 NOTE — Progress Notes (Signed)
Carelink Summary Report / Loop Recorder 

## 2017-12-03 MED FILL — CAPECITABINE 500 MG TABLET: 500 | 14 days supply | Qty: 84 | Fill #0

## 2017-12-03 MED FILL — TEMOZOLOMIDE 140 MG CAPSULE: 140 | 5 days supply | Qty: 10 | Fill #0

## 2017-12-04 ENCOUNTER — Other Ambulatory Visit: Payer: Medicare Other

## 2017-12-04 ENCOUNTER — Ambulatory Visit: Payer: Medicare Other | Admitting: Oncology

## 2017-12-05 ENCOUNTER — Encounter: Payer: Self-pay | Admitting: Oncology

## 2017-12-05 ENCOUNTER — Telehealth: Payer: Self-pay | Admitting: Oncology

## 2017-12-05 ENCOUNTER — Inpatient Hospital Stay: Payer: Medicare Other | Attending: Oncology | Admitting: Oncology

## 2017-12-05 ENCOUNTER — Inpatient Hospital Stay: Payer: Medicare Other

## 2017-12-05 VITALS — BP 134/52 | HR 74 | Temp 98.2°F | Resp 18 | Ht 64.0 in | Wt 228.4 lb

## 2017-12-05 DIAGNOSIS — K589 Irritable bowel syndrome without diarrhea: Secondary | ICD-10-CM | POA: Diagnosis not present

## 2017-12-05 DIAGNOSIS — Z5111 Encounter for antineoplastic chemotherapy: Secondary | ICD-10-CM

## 2017-12-05 DIAGNOSIS — Z8673 Personal history of transient ischemic attack (TIA), and cerebral infarction without residual deficits: Secondary | ICD-10-CM | POA: Insufficient documentation

## 2017-12-05 DIAGNOSIS — K219 Gastro-esophageal reflux disease without esophagitis: Secondary | ICD-10-CM | POA: Insufficient documentation

## 2017-12-05 DIAGNOSIS — Z79899 Other long term (current) drug therapy: Secondary | ICD-10-CM | POA: Diagnosis not present

## 2017-12-05 DIAGNOSIS — Z7901 Long term (current) use of anticoagulants: Secondary | ICD-10-CM

## 2017-12-05 DIAGNOSIS — E669 Obesity, unspecified: Secondary | ICD-10-CM | POA: Insufficient documentation

## 2017-12-05 DIAGNOSIS — C7B8 Other secondary neuroendocrine tumors: Secondary | ICD-10-CM

## 2017-12-05 DIAGNOSIS — Z86711 Personal history of pulmonary embolism: Secondary | ICD-10-CM | POA: Diagnosis not present

## 2017-12-05 DIAGNOSIS — C7A8 Other malignant neuroendocrine tumors: Secondary | ICD-10-CM

## 2017-12-05 DIAGNOSIS — F419 Anxiety disorder, unspecified: Secondary | ICD-10-CM | POA: Diagnosis not present

## 2017-12-05 DIAGNOSIS — E039 Hypothyroidism, unspecified: Secondary | ICD-10-CM | POA: Diagnosis not present

## 2017-12-05 DIAGNOSIS — I480 Paroxysmal atrial fibrillation: Secondary | ICD-10-CM | POA: Diagnosis not present

## 2017-12-05 DIAGNOSIS — E7800 Pure hypercholesterolemia, unspecified: Secondary | ICD-10-CM | POA: Diagnosis not present

## 2017-12-05 DIAGNOSIS — G40909 Epilepsy, unspecified, not intractable, without status epilepticus: Secondary | ICD-10-CM | POA: Diagnosis not present

## 2017-12-05 LAB — CBC WITH DIFFERENTIAL (CANCER CENTER ONLY)
BASOS ABS: 0 10*3/uL (ref 0.0–0.1)
BASOS PCT: 1 %
Eosinophils Absolute: 0.3 10*3/uL (ref 0.0–0.5)
Eosinophils Relative: 9 %
HEMATOCRIT: 38.3 % (ref 34.8–46.6)
Hemoglobin: 12.6 g/dL (ref 11.6–15.9)
Lymphocytes Relative: 24 %
Lymphs Abs: 0.8 10*3/uL — ABNORMAL LOW (ref 0.9–3.3)
MCH: 34.1 pg — ABNORMAL HIGH (ref 25.1–34.0)
MCHC: 32.9 g/dL (ref 31.5–36.0)
MCV: 103.5 fL — ABNORMAL HIGH (ref 79.5–101.0)
MONO ABS: 0.6 10*3/uL (ref 0.1–0.9)
Monocytes Relative: 19 %
NEUTROS ABS: 1.6 10*3/uL (ref 1.5–6.5)
Neutrophils Relative %: 47 %
PLATELETS: 101 10*3/uL — AB (ref 145–400)
RBC: 3.7 MIL/uL (ref 3.70–5.45)
RDW: 17 % — AB (ref 11.2–14.5)
WBC Count: 3.4 10*3/uL — ABNORMAL LOW (ref 3.9–10.3)

## 2017-12-05 LAB — CMP (CANCER CENTER ONLY)
ALBUMIN: 3.7 g/dL (ref 3.5–5.0)
ALT: 17 U/L (ref 0–55)
AST: 29 U/L (ref 5–34)
Alkaline Phosphatase: 90 U/L (ref 40–150)
Anion gap: 4 (ref 3–11)
BILIRUBIN TOTAL: 0.5 mg/dL (ref 0.2–1.2)
BUN: 10 mg/dL (ref 7–26)
CHLORIDE: 105 mmol/L (ref 98–109)
CO2: 28 mmol/L (ref 22–29)
Calcium: 9.9 mg/dL (ref 8.4–10.4)
Creatinine: 0.97 mg/dL (ref 0.60–1.10)
GFR, Est AFR Am: >60 mL/min (ref >60)
GFR, Estimated: 53 mL/min — ABNORMAL LOW (ref >60)
GLUCOSE: 87 mg/dL (ref 70–140)
POTASSIUM: 4.6 mmol/L (ref 3.5–5.1)
Sodium: 137 mmol/L (ref 136–145)
Total Protein: 6.8 g/dL (ref 6.4–8.3)

## 2017-12-05 NOTE — Progress Notes (Signed)
[facility] OFFICE PROGRESS NOTE  [name], [place] [address]  DIAGNOSIS:  1) Metastatic intermediate. Neuroendocrine tumor of lung primary diagnosed in January 2018 and presented with small bilateral pulmonary nodules in addition to multiple liver metastasis. 2) right lower lobe pulmonary embolism diagnosed incidentally on CT scan of the chest on 04/23/2017  PRIOR THERAPY: 1) Status post radio embolization with Y 90 to the liver lesions by interventional radiology. 2) status post IVC filter placement by interventional radiology on 04/24/2017  CURRENT THERAPY: Xeloda 750 MG/M2 twice a day days 1-14 and Temodar 150 MG/M2 days 10-14 every 4 weeks. Status post 13 cycles. She will start cycle #18 on Dec 08, 2017.  INTERVAL HISTORY: [name] 84 y.o. female returns for routine follow-up visit accompanied by her husband.  The patient is feeling fine today and has no specific complaints except for generalized fatigue and discomfort in her left neck with intermittent swelling.  Patient reports that she is been seen by her primary care provider for the left neck pain and swelling.  The pain is sharp and lasts for several seconds.  The swelling comes and goes.  She reports that her primary care provider ordered a carotid ultrasound, but her insurance denied it.  They are continuing to observe this area for now.  Patient denies headaches and visual disturbances.  The patient denies fevers and chills.  Denies chest pain, shortness of breath, cough, hemoptysis.  Denies nausea, vomiting, constipation, diarrhea.  Denies recent weight loss or night sweats.  Patient is here for evaluation and repeat lab work.  MEDICAL HISTORY: Past Medical History:  Diagnosis Date  . Acute respiratory failure with hypoxia ([place]) 10/23/2015  . Allergic rhinitis    PT. DENIES  . Anxiety   . Arthritis    NECK  . Ataxia   . Bradycardia    primarily nocturnal  . Burning tongue  syndrome 25 years  . Cancer ([place]) dx'd 06/2016   liver  . Cataract   . Cerebellar degeneration   . Chronic pericarditis   . Chronic urinary tract infection   . Complication of anesthesia    low o2 sats, coded 30 years ago  . CVA (cerebral infarction) 05/2003  . Depression   . Encounter for antineoplastic chemotherapy 10/09/2016  . Gait disorder   . Gastric polyps   . GERD (gastroesophageal reflux disease)   . Goals of care, counseling/discussion 10/09/2016  . High cholesterol   . Hyperlipidemia   . Hypotension   . Hypothyroidism   . IBS (irritable bowel syndrome)   . Obesity   . Paroxysmal atrial fibrillation ([name])    chads2vasc score is 6,  she is felt to be a poor candidate for anticoagulation  . Personal history of arterial venous malformation (AVM)    right side of face  . Seizure disorder ([place])   . Seizures ([place]) 2003   " smelling"- Gabapentin "no problem"  . Shortness of breath dyspnea    with exertion  . Sternum fx 10/27/2013  . Stroke [facility]) 5 years ago   Right side of face weak, slurred speach-   . Thyroid disease   . TIA (transient ischemic attack)   . UTI (lower urinary tract infection) 03/27/2016   "frequently"    ALLERGIES:  is allergic to lisinopril.  MEDICATIONS:  Current Outpatient Medications  Medication Sig Dispense Refill  . amitriptyline (ELAVIL) 25 MG tablet Take 1 tablet (25 mg total) by mouth at  bedtime. 90 tablet 1  . capecitabine (XELODA) 500 MG tablet TAKE 3 TABLETS (1,500 MG TOTAL) BY MOUTH 2 (TWO) TIMES DAILY AFTER A MEAL. 84 tablet 2  . escitalopram (LEXAPRO) 20 MG tablet Take 1 tablet (20 mg total) by mouth at bedtime. 90 tablet 1  . fluticasone (FLONASE) 50 MCG/ACT nasal spray Place 2 sprays into both nostrils at bedtime. 16 g 3  . furosemide (LASIX) 20 MG tablet TAKE 1 TABLET DAILY AS NEEDED 90 tablet 0  . gabapentin (NEURONTIN) 400 MG capsule Take 1-2 capsules (400-800 mg total) by mouth 3 (three) times daily. 720 capsule 1  .  hydroxypropyl methylcellulose / hypromellose (ISOPTO TEARS / GONIOVISC) 2.5 % ophthalmic solution Place 1 drop into both eyes at bedtime.    Marland Kitchen levothyroxine (SYNTHROID, LEVOTHROID) 75 MCG tablet Take 1 tablet (75 mcg total) by mouth daily before breakfast. 90 tablet 1  . lovastatin (MEVACOR) 40 MG tablet Take 1 tablet (40 mg total) by mouth at bedtime. 90 tablet 1  . Melatonin 3 MG TBDP Take 3-6 mg by mouth at bedtime as needed. 60 tablet 1  . omeprazole (PRILOSEC) 40 MG capsule Take 1 capsule (40 mg total) by mouth daily. 90 capsule 1  . ondansetron (ZOFRAN) 8 MG tablet Take 1 tablet (8 mg total) by mouth every 8 (eight) hours as needed for nausea or vomiting. 90 tablet 1  . oxyCODONE-acetaminophen (PERCOCET) 5-325 MG tablet Take 1 tablet by mouth every 8 (eight) hours as needed. 30 tablet 0  . temozolomide (TEMODAR) 140 MG capsule TAKE 2 CAPSULES BY MOUTH DAILY. MAY TAKE ON AN EMPTY STOMACH OR AT BEDTIME TO DECREASE NAUSEA AND VOMITING 10 capsule 2  . XARELTO 20 MG TABS tablet TAKE 1 TABLET DAILY WITH SUPPER 90 tablet 0   No current facility-administered medications for this visit.     SURGICAL HISTORY:  Past Surgical History:  Procedure Laterality Date  . APPENDECTOMY  84 years old  . COLONOSCOPY  2006, 2009  . COLONOSCOPY WITH PROPOFOL N/A 03/28/2016   Procedure: COLONOSCOPY WITH PROPOFOL;  Surgeon: Gatha Mayer, MD;  Location: [facility];  Service: Endoscopy;  Laterality: N/A;  . cyst removed  35 years ago  . EP IMPLANTABLE DEVICE N/A 01/06/2015   Procedure: Loop Recorder Insertion;  Surgeon: Thompson Grayer, MD;  Location: [facility];  Service: Cardiovascular;  Laterality: N/A;  . EYE SURGERY Right    Cataract  . IR ANGIOGRAM SELECTIVE EACH ADDITIONAL VESSEL  11/28/2016  . IR ANGIOGRAM SELECTIVE EACH ADDITIONAL VESSEL  11/28/2016  . IR ANGIOGRAM SELECTIVE EACH ADDITIONAL VESSEL  11/28/2016  . IR ANGIOGRAM SELECTIVE EACH ADDITIONAL VESSEL  11/28/2016  . IR ANGIOGRAM SELECTIVE EACH  ADDITIONAL VESSEL  12/13/2016  . IR ANGIOGRAM SELECTIVE EACH ADDITIONAL VESSEL  12/13/2016  . IR ANGIOGRAM SELECTIVE EACH ADDITIONAL VESSEL  01/09/2017  . IR ANGIOGRAM VISCERAL SELECTIVE  11/28/2016  . IR ANGIOGRAM VISCERAL SELECTIVE  11/28/2016  . IR ANGIOGRAM VISCERAL SELECTIVE  12/13/2016  . IR ANGIOGRAM VISCERAL SELECTIVE  12/13/2016  . IR ANGIOGRAM VISCERAL SELECTIVE  01/09/2017  . IR EMBO ARTERIAL NOT HEMORR HEMANG INC GUIDE ROADMAPPING  11/28/2016  . IR EMBO TUMOR ORGAN ISCHEMIA INFARCT INC GUIDE ROADMAPPING  12/13/2016  . IR EMBO TUMOR ORGAN ISCHEMIA INFARCT INC GUIDE ROADMAPPING  01/09/2017  . IR IVC FILTER PLMT / S&I /IMG GUID/MOD SED  04/24/2017  . IR RADIOLOGIST EVAL & MGMT  11/06/2016  . IR RADIOLOGIST EVAL & MGMT  01/02/2017  .  IR RADIOLOGIST EVAL & MGMT  02/05/2017  . IR RADIOLOGIST EVAL & MGMT  05/02/2017  . IR RADIOLOGIST EVAL & MGMT  10/17/2017  . IR US GUIDE VASC ACCESS RIGHT  11/28/2016  . IR US GUIDE VASC ACCESS RIGHT  12/13/2016  . IR US GUIDE VASC ACCESS RIGHT  01/09/2017  . KNEE ARTHROSCOPY Right 11/14/2006  . KNEE ARTHROSCOPY Bilateral 5 and 6 years ago  . KNEE ARTHROSCOPY WITH LATERAL MENISECTOMY  07/03/2012   Procedure: KNEE ARTHROSCOPY WITH LATERAL MENISECTOMY;  Surgeon: Magnus Sinning, MD;  Location: [facility];  Service: Orthopedics;  Laterality: Left;  with Partial Lateral Menisectomy and Medial Menisectomy. Shaving of medial and lateral femoral condyles. Shaving of patella. Removal of a loose body  . tibial and fibular internal fixation Left   . TOTAL ABDOMINAL HYSTERECTOMY  84 years old  . UPPER GASTROINTESTINAL ENDOSCOPY  2009, 2013    REVIEW OF SYSTEMS:   Review of Systems  Constitutional: Negative for appetite change, chills, fever and unexpected weight change. Positive for fatigue. HENT:   Negative for mouth sores, nosebleeds, sore throat and trouble swallowing.  Reports left neck pain which is sharp and intermittent.  Last for several seconds when it occurs.  She also  reports intermittent swelling to her left neck.  None today.  Eyes: Negative for eye problems and icterus.  Respiratory: Negative for cough, hemoptysis, shortness of breath and wheezing.   Cardiovascular: Negative for chest pain and leg swelling.  Gastrointestinal: Negative for abdominal pain, constipation, diarrhea, nausea and vomiting.  Genitourinary: Negative for bladder incontinence, difficulty urinating, dysuria, frequency and hematuria.   Musculoskeletal: Positive for generalized arthralgias secondary to arthritis.  Skin: Negative for itching and rash.  Neurological: Negative for dizziness, extremity weakness, headaches, light-headedness and seizures.  Hematological: Negative for adenopathy. Does not bruise/bleed easily.  Psychiatric/Behavioral: Negative for confusion, depression and sleep disturbance. The patient is not nervous/anxious.     PHYSICAL EXAMINATION:  Blood pressure (!) 134/52, pulse 74, temperature 98.2 F (36.8 C), temperature source Oral, resp. rate 18, height 5\' 4"  (1.626 m), weight 228 lb 6.4 oz (103.6 kg), SpO2 98 %.  ECOG PERFORMANCE STATUS: 1 - Symptomatic but completely ambulatory  Physical Exam  Constitutional: Oriented to person, place, and time and well-developed, well-nourished, and in no distress. No distress.  HENT:  Head: Normocephalic and atraumatic.  Mouth/Throat: Oropharynx is clear and moist. No oropharyngeal exudate.  Eyes: Conjunctivae are normal. Right eye exhibits no discharge. Left eye exhibits no discharge. No scleral icterus.  Neck: Normal range of motion. Neck supple.  Cardiovascular: Normal rate, irregular heart rate, normal heart sounds and intact distal pulses.   Pulmonary/Chest: Effort normal and breath sounds normal. No respiratory distress. No wheezes. No rales.  Abdominal: Soft. Bowel sounds are normal. Exhibits no distension and no mass. There is no tenderness.  Musculoskeletal: Normal range of motion. Exhibits no edema.   Lymphadenopathy:    No cervical adenopathy.  Neurological: Alert and oriented to person, place, and time. Exhibits normal muscle tone. Coordination normal.  Skin: Skin is warm and dry. No rash noted. Not diaphoretic. No erythema. No pallor.  Psychiatric: Mood, memory and judgment normal.  Vitals reviewed.  LABORATORY DATA: Lab Results  Component Value Date   WBC 3.4 (L) 12/05/2017   HGB 12.6 12/05/2017   HCT 38.3 12/05/2017   MCV 103.5 (H) 12/05/2017   PLT 101 (L) 12/05/2017      Chemistry      Component Value Date/Time   NA  137 12/05/2017 1412   NA 137 08/22/2017 1653   NA 135 (L) 07/10/2017 1142   K 4.6 12/05/2017 1412   K 4.6 07/10/2017 1142   CL 105 12/05/2017 1412   CO2 28 12/05/2017 1412   CO2 25 07/10/2017 1142   BUN 10 12/05/2017 1412   BUN 13 08/22/2017 1653   BUN 11.4 07/10/2017 1142   CREATININE 0.97 12/05/2017 1412   CREATININE 1.0 07/10/2017 1142      Component Value Date/Time   CALCIUM 9.9 12/05/2017 1412   CALCIUM 9.9 07/10/2017 1142   ALKPHOS 90 12/05/2017 1412   ALKPHOS 93 07/10/2017 1142   AST 29 12/05/2017 1412   AST 43 (H) 07/10/2017 1142   ALT 17 12/05/2017 1412   ALT 28 07/10/2017 1142   BILITOT 0.5 12/05/2017 1412   BILITOT 0.56 07/10/2017 1142       RADIOGRAPHIC STUDIES:  No results found.   ASSESSMENT/PLAN:  Neuroendocrine carcinoma metastatic to liver [facility]) This is a very pleasant 84 year old white female with metastatic intermediate grade neuroendocrine carcinoma of questionable lung primary and multiple metastatic liver lesions and pancreatic lesions.She status post treatment with radio embolization with Y90 to the left and right lobe liver lesions.Status post treatment with Y 90 to the left and right lobes of the liver. She is currently undergoing systemic chemotherapy with Xeloda and Temodar status post 17 cycles. The patient continues to tolerate this treatment well with no concerning complaints except for fatigue. I  recommended for her to proceed with cycle #18 as a scheduled on Dec 08, 2017. She will have a restaging CT scan of the chest, abdomen, pelvis prior to her next visit. Follow-up visit will be in 1 month for evaluation and repeat lab work.  For the pulmonary embolism, she will continue on Xarelto 20 mg p.o. Daily.  For the left neck pain and swelling, there were no concerning findings on exam today.  Recommend that she follow-up with her primary care provider.  She was advised to call immediately if she has any concerning symptoms in the interval. The patient voices understanding of current disease status and treatment options and is in agreement with the current care plan. All questions were answered. The patient knows to call the clinic with any problems, questions or concerns. We can certainly see the patient much sooner if necessary.   Orders Placed This Encounter  Procedures  . CT CHEST W CONTRAST    Standing Status:   Future    Standing Expiration Date:   12/06/2018    Order Specific Question:   If indicated for the ordered procedure, I authorize the administration of contrast media per Radiology protocol    Answer:   Yes    Order Specific Question:   Preferred imaging location?    Answer:   [facility]    Order Specific Question:   Radiology Contrast Protocol - do NOT remove file path    Answer:   \\[email or web address]    Order Specific Question:   Reason for Exam additional comments    Answer:   metastatic neuroendocrine tumor of the lung. Restaging.  Marland Kitchen CT ABDOMEN PELVIS W CONTRAST    Standing Status:   Future    Standing Expiration Date:   12/06/2018    Order Specific Question:   If indicated for the ordered procedure, I authorize the administration of contrast media per Radiology protocol    Answer:   Yes    Order Specific Question:   Preferred  imaging location?    Answer:   [facility]    Order Specific Question:   Radiology Contrast  Protocol - do NOT remove file path    Answer:   \\[email or web address]    Order Specific Question:   Reason for Exam additional comments    Answer:   metastatic neuroendocrine tumor of the lung. Restaging.  Marland Kitchen CBC with Differential (Cancer Center Only)    Standing Status:   Future    Standing Expiration Date:   12/06/2018  . CMP ([facility] only)    Standing Status:   Future    Standing Expiration Date:   12/06/2018   Mikey Bussing, DNP, AGPCNP-BC, AOCNP 12/05/17

## 2017-12-05 NOTE — Telephone Encounter (Signed)
Scheduled appt per 5/9 los - Gave patient AVS and calender per los.

## 2017-12-05 NOTE — Assessment & Plan Note (Addendum)
This is a very pleasant 84 year old white female with metastatic intermediate grade neuroendocrine carcinoma of questionable lung primary and multiple metastatic liver lesions and pancreatic lesions.She status post treatment with radio embolization with Y90 to the left and right lobe liver lesions.Status post treatment with Y 90 to the left and right lobes of the liver. She is currently undergoing systemic chemotherapy with Xeloda and Temodar status post 17 cycles. The patient continues to tolerate this treatment well with no concerning complaints except for fatigue. I recommended for her to proceed with cycle #18 as a scheduled on Dec 08, 2017. She will have a restaging CT scan of the chest, abdomen, pelvis prior to her next visit. Follow-up visit will be in 1 month for evaluation and repeat lab work.  For the pulmonary embolism, she will continue on Xarelto 20 mg p.o. Daily.  For the left neck pain and swelling, there were no concerning findings on exam today.  Recommend that she follow-up with her primary care provider.  She was advised to call immediately if she has any concerning symptoms in the interval. The patient voices understanding of current disease status and treatment options and is in agreement with the current care plan. All questions were answered. The patient knows to call the clinic with any problems, questions or concerns. We can certainly see the patient much sooner if necessary.

## 2017-12-19 ENCOUNTER — Ambulatory Visit (INDEPENDENT_AMBULATORY_CARE_PROVIDER_SITE_OTHER): Payer: Medicare Other | Admitting: Cardiology

## 2017-12-19 ENCOUNTER — Encounter: Payer: Self-pay | Admitting: Cardiology

## 2017-12-19 VITALS — BP 120/64 | HR 71 | Ht 64.0 in | Wt 234.6 lb

## 2017-12-19 DIAGNOSIS — I10 Essential (primary) hypertension: Secondary | ICD-10-CM

## 2017-12-19 DIAGNOSIS — I2782 Chronic pulmonary embolism: Secondary | ICD-10-CM | POA: Diagnosis not present

## 2017-12-19 DIAGNOSIS — I480 Paroxysmal atrial fibrillation: Secondary | ICD-10-CM

## 2017-12-19 DIAGNOSIS — I959 Hypotension, unspecified: Secondary | ICD-10-CM

## 2017-12-19 DIAGNOSIS — C7A8 Other malignant neuroendocrine tumors: Secondary | ICD-10-CM | POA: Diagnosis not present

## 2017-12-19 DIAGNOSIS — C7B8 Other secondary neuroendocrine tumors: Secondary | ICD-10-CM

## 2017-12-19 MED ORDER — FLUDROCORTISONE ACETATE 0.1 MG PO TABS: ORAL_TABLET | ORAL | 0 refills | Status: DC

## 2017-12-19 NOTE — Assessment & Plan Note (Signed)
On chemotherapy- followed by Dr [name]

## 2017-12-19 NOTE — Assessment & Plan Note (Signed)
Seen on CT Sept 2018- on chronic Xarelto

## 2017-12-19 NOTE — Patient Instructions (Addendum)
Take Florinef 0.1 mg every morning   Appointment scheduled with Kerin Ransom PA  Friday 01/17/18 at 3:30 pm.

## 2017-12-19 NOTE — Progress Notes (Signed)
12/19/2017 [name]   01-Sep-1933  [id number]  Primary Physician Rebekah Maize, MD Primary Cardiologist: Rebekah [name] Rebekah [name]  HPI:  Ms [name] is a pleasant 84 y/o female with multiple medical problems, seen today with complaints of dizziness which corollate with hypotension on her home B/P monitor. Her husband says her B/P drops to "70". She lays down and eventually this passes. She says she has these spells almost daily, usually around noon. She has been evaluated by Rebekah [name] for bradycardia but he does not feel this is the cause of her symptoms and she would not benifit from a pacemaker at this time. She does have a loop recorder, placed in 2016. She had a diagnosis of HTN but she is off all her hypertensive medications. She has metastatic cancer and is on chemotherapy followed by [name]. She has cerebellar degeneration and has been in a wheelchair for the past two years. Echo in Sept 2018 showed normal LVF and mild LVH. Myoview in 2015 was low risk, she has no documented CAD.   Current Outpatient Medications  Medication Sig Dispense Refill  . amitriptyline (ELAVIL) 25 MG tablet Take 1 tablet (25 mg total) by mouth at bedtime. 90 tablet 1  . capecitabine (XELODA) 500 MG tablet TAKE 3 TABLETS (1,500 MG TOTAL) BY MOUTH 2 (TWO) TIMES DAILY AFTER A MEAL. 84 tablet 2  . escitalopram (LEXAPRO) 20 MG tablet Take 1 tablet (20 mg total) by mouth at bedtime. 90 tablet 1  . fluticasone (FLONASE) 50 MCG/ACT nasal spray Place 2 sprays into both nostrils at bedtime. 16 g 3  . furosemide (LASIX) 20 MG tablet TAKE 1 TABLET DAILY AS NEEDED 90 tablet 0  . gabapentin (NEURONTIN) 400 MG capsule Take 1-2 capsules (400-800 mg total) by mouth 3 (three) times daily. 720 capsule 1  . hydroxypropyl methylcellulose / hypromellose (ISOPTO TEARS / GONIOVISC) 2.5 % ophthalmic solution Place 1 drop into both eyes at bedtime.    Marland Kitchen levothyroxine (SYNTHROID, LEVOTHROID) 75 MCG tablet Take 1 tablet (75 mcg  total) by mouth daily before breakfast. 90 tablet 1  . lovastatin (MEVACOR) 40 MG tablet Take 1 tablet (40 mg total) by mouth at bedtime. 90 tablet 1  . Melatonin 3 MG TBDP Take 3-6 mg by mouth at bedtime as needed. 60 tablet 1  . omeprazole (PRILOSEC) 40 MG capsule Take 1 capsule (40 mg total) by mouth daily. 90 capsule 1  . ondansetron (ZOFRAN) 8 MG tablet Take 1 tablet (8 mg total) by mouth every 8 (eight) hours as needed for nausea or vomiting. 90 tablet 1  . oxyCODONE-acetaminophen (PERCOCET) 5-325 MG tablet Take 1 tablet by mouth every 8 (eight) hours as needed. 30 tablet 0  . temozolomide (TEMODAR) 140 MG capsule TAKE 2 CAPSULES BY MOUTH DAILY. MAY TAKE ON AN EMPTY STOMACH OR AT BEDTIME TO DECREASE NAUSEA AND VOMITING 10 capsule 2  . XARELTO 20 MG TABS tablet TAKE 1 TABLET DAILY WITH SUPPER 90 tablet 0  . fludrocortisone (FLORINEF) 0.1 MG tablet Take 0.1 mg every morning 30 tablet 0   No current facility-administered medications for this visit.     Allergies  Allergen Reactions  . Lisinopril Cough    Past Medical History:  Diagnosis Date  . Acute respiratory failure with hypoxia ([place]) 10/23/2015  . Allergic rhinitis    PT. DENIES  . Anxiety   . Arthritis    NECK  . Ataxia   . Bradycardia    primarily nocturnal  .  Burning tongue syndrome 25 years  . Cancer ([place]) dx'd 06/2016   liver  . Cataract   . Cerebellar degeneration   . Chronic pericarditis   . Chronic urinary tract infection   . Complication of anesthesia    low o2 sats, coded 30 years ago  . CVA (cerebral infarction) 05/2003  . Depression   . Encounter for antineoplastic chemotherapy 10/09/2016  . Gait disorder   . Gastric polyps   . GERD (gastroesophageal reflux disease)   . Goals of care, counseling/discussion 10/09/2016  . High cholesterol   . Hyperlipidemia   . Hypotension   . Hypothyroidism   . IBS (irritable bowel syndrome)   . Obesity   . Paroxysmal atrial fibrillation ([name])    chads2vasc score is  6,  she is felt to be a poor candidate for anticoagulation  . Personal history of arterial venous malformation (AVM)    right side of face  . Seizure disorder ([place])   . Seizures ([place]) 2003   " smelling"- Gabapentin "no problem"  . Shortness of breath dyspnea    with exertion  . Sternum fx 10/27/2013  . Stroke [facility]) 5 years ago   Right side of face weak, slurred speach-   . Thyroid disease   . TIA (transient ischemic attack)   . UTI (lower urinary tract infection) 03/27/2016   "frequently"    Social History   Socioeconomic History  . Marital status: Married    Spouse name: Not on file  . Number of children: 6  . Years of education: Not on file  . Highest education level: 10th grade  Occupational History  . Occupation: Agricultural engineer  Social Needs  . Financial resource strain: Not hard at all  . Food insecurity:    Worry: Never true    Inability: Never true  . Transportation needs:    Medical: No    Non-medical: No  Tobacco Use  . Smoking status: Former Smoker    Packs/day: 0.25    Years: 10.00    Pack years: 2.50    Types: Cigarettes    Last attempt to quit: 07/31/1975    Years since quitting: 42.4  . Smokeless tobacco: Never Used  Substance and Sexual Activity  . Alcohol use: No    Comment: Rare- maybe a drink ever 2 years  . Drug use: No  . Sexual activity: Not Currently  Lifestyle  . Physical activity:    Days per week: 0 days    Minutes per session: 0 min  . Stress: Not at all  Relationships  . Social connections:    Talks on phone: More than three times a week    Gets together: More than three times a week    Attends religious service: Never    Active member of club or organization: No    Attends meetings of clubs or organizations: Never    Relationship status: Married  . Intimate partner violence:    Fear of current or ex partner: No    Emotionally abused: No    Physically abused: No    Forced sexual activity: No  Other Topics Concern  . Not on file    Social History Narrative   Lives with husband, does have stairs, does not use them. Pt completed 10th grade.     Family History  Problem Relation Age of Onset  . Heart attack Father 20       fatal  . Coronary artery disease Brother   . Diabetes Brother   .  Prostate cancer Brother   . Prostate cancer Son      Review of Systems: General: negative for chills, fever, night sweats -10 lb weight gain over last 2 months Cardiovascular: negative for chest pain, dyspnea on exertion, edema, orthopnea, palpitations, paroxysmal nocturnal dyspnea or shortness of breath Dermatological: negative for rash Respiratory: negative for cough or wheezing Urologic: negative for hematuria Abdominal: negative for nausea, vomiting, diarrhea, bright red blood per rectum, melena, or hematemesis Neurologic: negative for visual changes, syncope,  All other systems reviewed and are otherwise negative except as noted above.    Blood pressure 120/64, pulse 71, height 5\' 4"  (1.626 m), weight 234 lb 9.6 oz (106.4 kg).  General appearance: alert, cooperative, no distress and moderately obese Lungs: clear to auscultation bilaterally Heart: regular rate and rhythm Extremities: no edema Skin: pale cool dry Neurologic: Grossly normal  EKG NSR  ASSESSMENT AND PLAN:   Hypotension This appears to be symptomatic  Neuroendocrine carcinoma metastatic to liver [facility]) On chemotherapy- followed by [name]  Paroxysmal atrial fibrillation [facility]) H/O PAF- she is on chronic anticoagulation- no rate control drugs as she has had bradycardia in the past  Pulmonary embolism [facility]) Seen on CT Sept 2018- on chronic Xarelto  Morbid obesity ([name]) BMI is 40- she can't understand why her weight is going up   PLAN  I suggested she wear support stocking and liberalize her fluid intake. Despite noted weight gain she does not appear to be in diastolic CHF. I also suggested she try florinef for a few weeks. Sh might also be a  candidate for Northera, we'll see how she responds to the florinef.   Kerin Ransom PA-C 12/19/2017 1:59 PM

## 2017-12-19 NOTE — Assessment & Plan Note (Signed)
H/O PAF- she is on chronic anticoagulation- no rate control drugs as she has had bradycardia in the past

## 2017-12-19 NOTE — Assessment & Plan Note (Signed)
BMI is 40- she can't understand why her weight is going up

## 2017-12-19 NOTE — Progress Notes (Signed)
flrinef

## 2017-12-19 NOTE — Assessment & Plan Note (Signed)
This appears to be symptomatic

## 2017-12-24 LAB — CUP PACEART REMOTE DEVICE CHECK
Implantable Pulse Generator Implant Date: 20160609
MDC IDC SESS DTM: 20190505003820

## 2017-12-25 ENCOUNTER — Other Ambulatory Visit: Payer: Self-pay | Admitting: Pediatrics

## 2017-12-25 DIAGNOSIS — R609 Edema, unspecified: Secondary | ICD-10-CM

## 2017-12-30 ENCOUNTER — Ambulatory Visit (HOSPITAL_COMMUNITY)
Admission: RE | Admit: 2017-12-30 | Discharge: 2017-12-30 | Disposition: A | Discharge status: Home / Self Care | Payer: Medicare Other | Source: Ambulatory Visit | Attending: Oncology | Admitting: Oncology

## 2017-12-30 ENCOUNTER — Encounter (HOSPITAL_COMMUNITY): Payer: Self-pay

## 2017-12-30 ENCOUNTER — Inpatient Hospital Stay: Payer: Medicare Other | Attending: Internal Medicine

## 2017-12-30 DIAGNOSIS — Z8673 Personal history of transient ischemic attack (TIA), and cerebral infarction without residual deficits: Secondary | ICD-10-CM | POA: Insufficient documentation

## 2017-12-30 DIAGNOSIS — E039 Hypothyroidism, unspecified: Secondary | ICD-10-CM | POA: Insufficient documentation

## 2017-12-30 DIAGNOSIS — E7800 Pure hypercholesterolemia, unspecified: Secondary | ICD-10-CM | POA: Insufficient documentation

## 2017-12-30 DIAGNOSIS — I480 Paroxysmal atrial fibrillation: Secondary | ICD-10-CM | POA: Diagnosis not present

## 2017-12-30 DIAGNOSIS — K219 Gastro-esophageal reflux disease without esophagitis: Secondary | ICD-10-CM | POA: Diagnosis not present

## 2017-12-30 DIAGNOSIS — C787 Secondary malignant neoplasm of liver and intrahepatic bile duct: Secondary | ICD-10-CM | POA: Diagnosis not present

## 2017-12-30 DIAGNOSIS — F419 Anxiety disorder, unspecified: Secondary | ICD-10-CM | POA: Insufficient documentation

## 2017-12-30 DIAGNOSIS — G40909 Epilepsy, unspecified, not intractable, without status epilepticus: Secondary | ICD-10-CM | POA: Diagnosis not present

## 2017-12-30 DIAGNOSIS — R918 Other nonspecific abnormal finding of lung field: Secondary | ICD-10-CM | POA: Insufficient documentation

## 2017-12-30 DIAGNOSIS — I712 Thoracic aortic aneurysm, without rupture: Secondary | ICD-10-CM | POA: Diagnosis not present

## 2017-12-30 DIAGNOSIS — E669 Obesity, unspecified: Secondary | ICD-10-CM | POA: Insufficient documentation

## 2017-12-30 DIAGNOSIS — Z7901 Long term (current) use of anticoagulants: Secondary | ICD-10-CM | POA: Diagnosis not present

## 2017-12-30 DIAGNOSIS — Z79899 Other long term (current) drug therapy: Secondary | ICD-10-CM | POA: Diagnosis not present

## 2017-12-30 DIAGNOSIS — C7A8 Other malignant neuroendocrine tumors: Secondary | ICD-10-CM | POA: Diagnosis not present

## 2017-12-30 DIAGNOSIS — C7B8 Other secondary neuroendocrine tumors: Secondary | ICD-10-CM | POA: Insufficient documentation

## 2017-12-30 DIAGNOSIS — K589 Irritable bowel syndrome without diarrhea: Secondary | ICD-10-CM | POA: Diagnosis not present

## 2017-12-30 DIAGNOSIS — C3490 Malignant neoplasm of unspecified part of unspecified bronchus or lung: Secondary | ICD-10-CM | POA: Diagnosis not present

## 2017-12-30 DIAGNOSIS — I700 Atherosclerosis of aorta: Secondary | ICD-10-CM | POA: Insufficient documentation

## 2017-12-30 DIAGNOSIS — I2510 Atherosclerotic heart disease of native coronary artery without angina pectoris: Secondary | ICD-10-CM | POA: Insufficient documentation

## 2017-12-30 DIAGNOSIS — Z86711 Personal history of pulmonary embolism: Secondary | ICD-10-CM | POA: Insufficient documentation

## 2017-12-30 LAB — CMP (CANCER CENTER ONLY)
ALBUMIN: 3.8 g/dL (ref 3.5–5.0)
ALT: 12 U/L (ref 0–55)
AST: 25 U/L (ref 5–34)
Alkaline Phosphatase: 93 U/L (ref 40–150)
Anion gap: 7 (ref 3–11)
BUN: 11 mg/dL (ref 7–26)
CHLORIDE: 104 mmol/L (ref 98–109)
CO2: 27 mmol/L (ref 22–29)
Calcium: 9.7 mg/dL (ref 8.4–10.4)
Creatinine: 0.88 mg/dL (ref 0.60–1.10)
GFR, EST NON AFRICAN AMERICAN: 60 mL/min — AB (ref >60)
GFR, Est AFR Am: >60 mL/min (ref >60)
GLUCOSE: 93 mg/dL (ref 70–140)
Potassium: 3.8 mmol/L (ref 3.5–5.1)
Sodium: 138 mmol/L (ref 136–145)
Total Bilirubin: 0.7 mg/dL (ref 0.2–1.2)
Total Protein: 7.2 g/dL (ref 6.4–8.3)

## 2017-12-30 LAB — CBC WITH DIFFERENTIAL (CANCER CENTER ONLY)
BASOS ABS: 0 10*3/uL (ref 0.0–0.1)
BASOS PCT: 1 %
EOS ABS: 0.2 10*3/uL (ref 0.0–0.5)
EOS PCT: 7 %
HCT: 39.1 % (ref 34.8–46.6)
Hemoglobin: 12.8 g/dL (ref 11.6–15.9)
Lymphocytes Relative: 20 %
Lymphs Abs: 0.6 10*3/uL — ABNORMAL LOW (ref 0.9–3.3)
MCH: 33.9 pg (ref 25.1–34.0)
MCHC: 32.7 g/dL (ref 31.5–36.0)
MCV: 103.7 fL — ABNORMAL HIGH (ref 79.5–101.0)
MONO ABS: 0.5 10*3/uL (ref 0.1–0.9)
Monocytes Relative: 17 %
Neutro Abs: 1.7 10*3/uL (ref 1.5–6.5)
Neutrophils Relative %: 55 %
PLATELETS: 117 10*3/uL — AB (ref 145–400)
RBC: 3.77 MIL/uL (ref 3.70–5.45)
RDW: 19.7 % — ABNORMAL HIGH (ref 11.2–14.5)
WBC Count: 3.1 10*3/uL — ABNORMAL LOW (ref 3.9–10.3)

## 2017-12-30 MED ORDER — IOPAMIDOL (ISOVUE-300) INJECTION 61%
INTRAVENOUS | Status: AC
  Filled 2017-12-30: qty 100

## 2017-12-30 MED ORDER — IOPAMIDOL (ISOVUE-300) INJECTION 61%
100.0000 mL | Freq: Once | INTRAVENOUS | Status: AC | PRN
  Administered 2017-12-30: 100 mL via INTRAVENOUS

## 2017-12-30 MED FILL — CAPECITABINE 500 MG TABLET: 500 | 14 days supply | Qty: 84 | Fill #1

## 2017-12-30 MED FILL — TEMOZOLOMIDE 140 MG CAPSULE: 140 | 5 days supply | Qty: 10 | Fill #1

## 2018-01-01 ENCOUNTER — Telehealth: Payer: Self-pay | Admitting: *Deleted

## 2018-01-01 ENCOUNTER — Telehealth: Payer: Self-pay | Admitting: Cardiology

## 2018-01-01 NOTE — Telephone Encounter (Signed)
Spoke w/ pt husband and requested that she send a manual transmission b/c her home monitor has not updated in at least 14 days.   

## 2018-01-01 NOTE — Telephone Encounter (Signed)
ECG printed and placed in Dr. Otilio Connors folder for review as episode was nocturnal and asymptomatic.

## 2018-01-01 NOTE — Telephone Encounter (Signed)
Manual transmission received today due to disconnected ILR monitor.  Pause episode noted from 12/20/17 at 0308, duration 5sec.  Spoke with patient, who reports she was likely asleep at t he time, no recent syncope.  She has been dealing with hypotension, which is being followed by primary cardiology, currently taking florinef.  Her B/P sometimes runs 90s/40s, which correlates with her dizzy spells, but has otherwise been in the 407W systolic since starting florinef.  Advised patient to discuss these ongoing episodes with Kerin Ransom, PA, at upcoming appointment on 01/17/18.  Advised that Dr. Rayann Heman will review pause episode and I will call her back if any additional recommendations.  Patient verbalizes understanding and appreciation.  She denies questions or concerns at this time.

## 2018-01-02 ENCOUNTER — Ambulatory Visit (INDEPENDENT_AMBULATORY_CARE_PROVIDER_SITE_OTHER): Payer: Medicare Other | Admitting: *Deleted

## 2018-01-02 DIAGNOSIS — R001 Bradycardia, unspecified: Secondary | ICD-10-CM

## 2018-01-02 NOTE — Progress Notes (Signed)
Carelink Summary Report / Loop Recorder 

## 2018-01-06 ENCOUNTER — Encounter: Payer: Self-pay | Admitting: Internal Medicine

## 2018-01-06 ENCOUNTER — Telehealth: Payer: Self-pay | Admitting: Internal Medicine

## 2018-01-06 ENCOUNTER — Inpatient Hospital Stay (HOSPITAL_BASED_OUTPATIENT_CLINIC_OR_DEPARTMENT_OTHER): Payer: Medicare Other | Admitting: Internal Medicine

## 2018-01-06 VITALS — BP 127/57 | HR 65 | Temp 98.3°F | Resp 17 | Ht 64.0 in | Wt 231.3 lb

## 2018-01-06 DIAGNOSIS — E039 Hypothyroidism, unspecified: Secondary | ICD-10-CM | POA: Diagnosis not present

## 2018-01-06 DIAGNOSIS — C7A8 Other malignant neuroendocrine tumors: Secondary | ICD-10-CM | POA: Diagnosis not present

## 2018-01-06 DIAGNOSIS — C787 Secondary malignant neoplasm of liver and intrahepatic bile duct: Secondary | ICD-10-CM

## 2018-01-06 DIAGNOSIS — Z7901 Long term (current) use of anticoagulants: Secondary | ICD-10-CM

## 2018-01-06 DIAGNOSIS — Z86711 Personal history of pulmonary embolism: Secondary | ICD-10-CM

## 2018-01-06 DIAGNOSIS — I10 Essential (primary) hypertension: Secondary | ICD-10-CM

## 2018-01-06 DIAGNOSIS — C7B8 Other secondary neuroendocrine tumors: Secondary | ICD-10-CM

## 2018-01-06 DIAGNOSIS — Z79899 Other long term (current) drug therapy: Secondary | ICD-10-CM

## 2018-01-06 DIAGNOSIS — Z5111 Encounter for antineoplastic chemotherapy: Secondary | ICD-10-CM

## 2018-01-06 NOTE — Telephone Encounter (Signed)
Appointments scheduled AVS/Calendar printed per 6/10 los °

## 2018-01-06 NOTE — Progress Notes (Signed)
[facility] [phone]   [phone]  OFFICE PROGRESS NOTE  [name], [name] [address]  DIAGNOSIS:  1) Metastatic intermediate. Neuroendocrine tumor of lung primary diagnosed in January 2018 and presented with small bilateral pulmonary nodules in addition to multiple liver metastasis. 2) right lower lobe pulmonary embolism diagnosed incidentally on CT scan of the chest on 04/23/2017  PRIOR THERAPY:  1) Status post radio embolization with Y 90 to the liver lesions by interventional radiology. 2) status post IVC filter placement by interventional radiology on 04/24/2017  CURRENT THERAPY: Xeloda 750 MG/M2 twice a day days 1-14 and Temodar 150 MG/M2 days 10-14 every 4 weeks. Status post 18 cycles. She will start cycle #19 on January 11, 2018.  INTERVAL HISTORY: [name] 84 y.o. female returns to the clinic today for follow-up visit accompanied by her husband.  The patient is feeling fine today with no specific complaints.  She denied having any chest pain, shortness of breath, cough or hemoptysis.  She denied having any fever or chills.  She has no nausea, vomiting, diarrhea or constipation.  She has no recent weight loss or night sweats.  She continues to tolerate her treatment with Xeloda and Temodar fairly well.  She had repeat CT scan of the chest, abdomen and pelvis performed recently and she is here for evaluation and discussion of her risk her results.   MEDICAL HISTORY: Past Medical History:  Diagnosis Date  . Acute respiratory failure with hypoxia ([facility]) 10/23/2015  . Allergic rhinitis    PT. DENIES  . Anxiety   . Arthritis    NECK  . Ataxia   . Bradycardia    primarily nocturnal  . Burning tongue syndrome 25 years  . Cancer ([facility]) dx'd 06/2016   liver  . Cataract   . Cerebellar degeneration   . Chronic pericarditis   . Chronic urinary tract infection   . Complication of anesthesia    low o2 sats,  coded 30 years ago  . CVA (cerebral infarction) 05/2003  . Depression   . Encounter for antineoplastic chemotherapy 10/09/2016  . Gait disorder   . Gastric polyps   . GERD (gastroesophageal reflux disease)   . Goals of care, counseling/discussion 10/09/2016  . High cholesterol   . Hyperlipidemia   . Hypotension   . Hypothyroidism   . IBS (irritable bowel syndrome)   . Obesity   . Paroxysmal atrial fibrillation ([name])    chads2vasc score is 6,  she is felt to be a poor candidate for anticoagulation  . Personal history of arterial venous malformation (AVM)    right side of face  . Seizure disorder ([facility])   . Seizures ([facility]) 2003   " smelling"- Gabapentin "no problem"  . Shortness of breath dyspnea    with exertion  . Sternum fx 10/27/2013  . Stroke [facility]) 5 years ago   Right side of face weak, slurred speach-   . Thyroid disease   . TIA (transient ischemic attack)   . UTI (lower urinary tract infection) 03/27/2016   "frequently"    ALLERGIES:  is allergic to lisinopril.  MEDICATIONS:  Current Outpatient Medications  Medication Sig Dispense Refill  . amitriptyline (ELAVIL) 25 MG tablet Take 1 tablet (25 mg total) by mouth at bedtime. 90 tablet 1  . capecitabine (XELODA) 500 MG tablet TAKE 3 TABLETS (1,500 MG TOTAL) BY MOUTH 2 (TWO) TIMES DAILY AFTER A MEAL. 84 tablet  2  . escitalopram (LEXAPRO) 20 MG tablet Take 1 tablet (20 mg total) by mouth at bedtime. 90 tablet 1  . fludrocortisone (FLORINEF) 0.1 MG tablet Take 0.1 mg every morning 30 tablet 0  . fluticasone (FLONASE) 50 MCG/ACT nasal spray Place 2 sprays into both nostrils at bedtime. 16 g 3  . furosemide (LASIX) 20 MG tablet TAKE 1 TABLET DAILY AS NEEDED 90 tablet 0  . gabapentin (NEURONTIN) 400 MG capsule Take 1-2 capsules (400-800 mg total) by mouth 3 (three) times daily. 720 capsule 1  . hydroxypropyl methylcellulose / hypromellose (ISOPTO TEARS / GONIOVISC) 2.5 % ophthalmic solution Place 1 drop into both eyes at bedtime.     Marland Kitchen levothyroxine (SYNTHROID, LEVOTHROID) 75 MCG tablet Take 1 tablet (75 mcg total) by mouth daily before breakfast. 90 tablet 1  . lovastatin (MEVACOR) 40 MG tablet Take 1 tablet (40 mg total) by mouth at bedtime. 90 tablet 1  . Melatonin 3 MG TBDP Take 3-6 mg by mouth at bedtime as needed. 60 tablet 1  . omeprazole (PRILOSEC) 40 MG capsule Take 1 capsule (40 mg total) by mouth daily. 90 capsule 1  . ondansetron (ZOFRAN) 8 MG tablet Take 1 tablet (8 mg total) by mouth every 8 (eight) hours as needed for nausea or vomiting. 90 tablet 1  . oxyCODONE-acetaminophen (PERCOCET) 5-325 MG tablet Take 1 tablet by mouth every 8 (eight) hours as needed. 30 tablet 0  . temozolomide (TEMODAR) 140 MG capsule TAKE 2 CAPSULES BY MOUTH DAILY. MAY TAKE ON AN EMPTY STOMACH OR AT BEDTIME TO DECREASE NAUSEA AND VOMITING 10 capsule 2  . XARELTO 20 MG TABS tablet TAKE 1 TABLET DAILY WITH SUPPER 90 tablet 0   No current facility-administered medications for this visit.     SURGICAL HISTORY:  Past Surgical History:  Procedure Laterality Date  . APPENDECTOMY  84 years old  . COLONOSCOPY  2006, 2009  . COLONOSCOPY WITH PROPOFOL N/A 03/28/2016   Procedure: COLONOSCOPY WITH PROPOFOL;  Surgeon: Gatha Mayer, MD;  Location: [facility];  Service: Endoscopy;  Laterality: N/A;  . cyst removed  35 years ago  . EP IMPLANTABLE DEVICE N/A 01/06/2015   Procedure: Loop Recorder Insertion;  Surgeon: Thompson Grayer, MD;  Location: [facility];  Service: Cardiovascular;  Laterality: N/A;  . EYE SURGERY Right    Cataract  . IR ANGIOGRAM SELECTIVE EACH ADDITIONAL VESSEL  11/28/2016  . IR ANGIOGRAM SELECTIVE EACH ADDITIONAL VESSEL  11/28/2016  . IR ANGIOGRAM SELECTIVE EACH ADDITIONAL VESSEL  11/28/2016  . IR ANGIOGRAM SELECTIVE EACH ADDITIONAL VESSEL  11/28/2016  . IR ANGIOGRAM SELECTIVE EACH ADDITIONAL VESSEL  12/13/2016  . IR ANGIOGRAM SELECTIVE EACH ADDITIONAL VESSEL  12/13/2016  . IR ANGIOGRAM SELECTIVE EACH ADDITIONAL VESSEL   01/09/2017  . IR ANGIOGRAM VISCERAL SELECTIVE  11/28/2016  . IR ANGIOGRAM VISCERAL SELECTIVE  11/28/2016  . IR ANGIOGRAM VISCERAL SELECTIVE  12/13/2016  . IR ANGIOGRAM VISCERAL SELECTIVE  12/13/2016  . IR ANGIOGRAM VISCERAL SELECTIVE  01/09/2017  . IR EMBO ARTERIAL NOT HEMORR HEMANG INC GUIDE ROADMAPPING  11/28/2016  . IR EMBO TUMOR ORGAN ISCHEMIA INFARCT INC GUIDE ROADMAPPING  12/13/2016  . IR EMBO TUMOR ORGAN ISCHEMIA INFARCT INC GUIDE ROADMAPPING  01/09/2017  . IR IVC FILTER PLMT / S&I /IMG GUID/MOD SED  04/24/2017  . IR RADIOLOGIST EVAL & MGMT  11/06/2016  . IR RADIOLOGIST EVAL & MGMT  01/02/2017  . IR RADIOLOGIST EVAL & MGMT  02/05/2017  . IR RADIOLOGIST EVAL &  MGMT  05/02/2017  . IR RADIOLOGIST EVAL & MGMT  10/17/2017  . IR US GUIDE VASC ACCESS RIGHT  11/28/2016  . IR US GUIDE VASC ACCESS RIGHT  12/13/2016  . IR US GUIDE VASC ACCESS RIGHT  01/09/2017  . KNEE ARTHROSCOPY Right 11/14/2006  . KNEE ARTHROSCOPY Bilateral 5 and 6 years ago  . KNEE ARTHROSCOPY WITH LATERAL MENISECTOMY  07/03/2012   Procedure: KNEE ARTHROSCOPY WITH LATERAL MENISECTOMY;  Surgeon: Magnus Sinning, MD;  Location: [facility];  Service: Orthopedics;  Laterality: Left;  with Partial Lateral Menisectomy and Medial Menisectomy. Shaving of medial and lateral femoral condyles. Shaving of patella. Removal of a loose body  . tibial and fibular internal fixation Left   . TOTAL ABDOMINAL HYSTERECTOMY  83 years old  . UPPER GASTROINTESTINAL ENDOSCOPY  2009, 2013    REVIEW OF SYSTEMS:  Constitutional: negative Eyes: negative Ears, nose, mouth, throat, and face: negative Respiratory: positive for dyspnea on exertion Cardiovascular: negative Gastrointestinal: negative Genitourinary:negative Integument/breast: negative Hematologic/lymphatic: negative Musculoskeletal:negative Neurological: negative Behavioral/Psych: negative Endocrine: negative Allergic/Immunologic: negative   PHYSICAL EXAMINATION: General appearance: alert, cooperative  and no distress Head: Normocephalic, without obvious abnormality, atraumatic Neck: no adenopathy, no JVD, supple, symmetrical, trachea midline and thyroid not enlarged, symmetric, no tenderness/mass/nodules Lymph nodes: Cervical, supraclavicular, and axillary nodes normal. Resp: clear to auscultation bilaterally Back: symmetric, no curvature. ROM normal. No CVA tenderness. Cardio: regular rate and rhythm, S1, S2 normal, no murmur, click, rub or gallop GI: soft, non-tender; bowel sounds normal; no masses,  no organomegaly Extremities: extremities normal, atraumatic, no cyanosis or edema Neurologic: Alert and oriented X 3, normal strength and tone. Normal symmetric reflexes. Normal coordination and gait  ECOG PERFORMANCE STATUS: 1 - Symptomatic but completely ambulatory  Blood pressure (!) 127/57, pulse 65, temperature 98.3 F (36.8 C), temperature source Oral, resp. rate 17, height 5\' 4"  (1.626 m), weight 231 lb 4.8 oz (104.9 kg), SpO2 98 %.  LABORATORY DATA: Lab Results  Component Value Date   WBC 3.1 (L) 12/30/2017   HGB 12.8 12/30/2017   HCT 39.1 12/30/2017   MCV 103.7 (H) 12/30/2017   PLT 117 (L) 12/30/2017      Chemistry      Component Value Date/Time   NA 138 12/30/2017 1319   NA 137 08/22/2017 1653   NA 135 (L) 07/10/2017 1142   K 3.8 12/30/2017 1319   K 4.6 07/10/2017 1142   CL 104 12/30/2017 1319   CO2 27 12/30/2017 1319   CO2 25 07/10/2017 1142   BUN 11 12/30/2017 1319   BUN 13 08/22/2017 1653   BUN 11.4 07/10/2017 1142   CREATININE 0.88 12/30/2017 1319   CREATININE 1.0 07/10/2017 1142      Component Value Date/Time   CALCIUM 9.7 12/30/2017 1319   CALCIUM 9.9 07/10/2017 1142   ALKPHOS 93 12/30/2017 1319   ALKPHOS 93 07/10/2017 1142   AST 25 12/30/2017 1319   AST 43 (H) 07/10/2017 1142   ALT 12 12/30/2017 1319   ALT 28 07/10/2017 1142   BILITOT 0.7 12/30/2017 1319   BILITOT 0.56 07/10/2017 1142       RADIOGRAPHIC STUDIES: Ct Chest W  Contrast  Result Date: 12/30/2017 CLINICAL DATA:  Followup lung cancer. EXAM: CT CHEST, ABDOMEN, AND PELVIS WITH CONTRAST TECHNIQUE: Multidetector CT imaging of the chest, abdomen and pelvis was performed following the standard protocol during bolus administration of intravenous contrast. CONTRAST:  122mL ISOVUE-300 IOPAMIDOL (ISOVUE-300) INJECTION 61% COMPARISON:  10/08/2017 FINDINGS: CT CHEST FINDINGS Cardiovascular: Normal  heart size. No pericardial effusion. Mild aneurysmal dilatation of the ascending thoracic aorta is stable measuring 4 cm, image 24/2. Aortic atherosclerosis. Calcification within the LAD coronary artery noted. Mediastinum/Nodes: Normal appearance of the thyroid gland. The trachea appears patent and is midline. Normal appearance of the esophagus. No enlarged mediastinal or hilar lymph nodes. Lungs/Pleura: No pleural effusion. Scattered pulmonary nodules are again noted. Index nodule within the anterior right upper lobe measures 1.2 cm, image 45/7. Stable from previous exam. The index perifissural nodule within the oblique fissure is stable measuring 4 mm. Other small scattered nodules remain unchanged. Musculoskeletal: No aggressive lytic or sclerotic bone lesions identified. Healed fracture deformity of the body of sternum is again noted and appears unchanged. CT ABDOMEN PELVIS FINDINGS Hepatobiliary: Within segment 7 of the liver there is a low-density lesion measuring 1.2 by 1.4 cm, image 43/2. Previously 1.5 x 1.9 cm. The lesion within segment 3 measures 1.7 x 1.8 cm, image 50/2. Previously 2.0 x 2.5 cm. Segment 2 liver lesion measures 1.2 cm, image 41/2. Previously 1.3 cm. No new or enlarging liver lesions. Stones identified within the gallbladder. No biliary dilatation. Pancreas: Normal appearance of the pancreas. Spleen: The spleen is unremarkable. Adrenals/Urinary Tract: The adrenal glands are both normal. Mild bilateral renal cortical lobulation noted. No mass or hydronephrosis  identified. Urinary bladder is normal. Stomach/Bowel: The stomach is normal. The small bowel loops have a normal course and caliber. Normal appearance of the colon. Vascular/Lymphatic: Aortic atherosclerosis. No aneurysm. No adenopathy within the abdomen or pelvis. No inguinal adenopathy. Reproductive: Status post hysterectomy. No adnexal masses. Other: No abdominal wall hernia or abnormality. No abdominopelvic ascites. Musculoskeletal: Spondylosis noted within the lumbar spine. No aggressive lytic or sclerotic bone lesions. IMPRESSION: 1. Stable appearance of the chest. Multiple pulmonary nodules are unchanged in the interval. 2. Mild decrease in size of liver metastasis. 3. Aortic atherosclerosis with lad coronary artery atherosclerotic calcifications. Aortic Atherosclerosis (ICD10-I70.0). 4. Stable mild aneurysmal dilatation of the ascending thoracic aorta. Electronically Signed   By: Kerby Moors M.D.   On: 12/30/2017 16:19   Ct Abdomen Pelvis W Contrast  Result Date: 12/30/2017 CLINICAL DATA:  Followup lung cancer. EXAM: CT CHEST, ABDOMEN, AND PELVIS WITH CONTRAST TECHNIQUE: Multidetector CT imaging of the chest, abdomen and pelvis was performed following the standard protocol during bolus administration of intravenous contrast. CONTRAST:  148mL ISOVUE-300 IOPAMIDOL (ISOVUE-300) INJECTION 61% COMPARISON:  10/08/2017 FINDINGS: CT CHEST FINDINGS Cardiovascular: Normal heart size. No pericardial effusion. Mild aneurysmal dilatation of the ascending thoracic aorta is stable measuring 4 cm, image 24/2. Aortic atherosclerosis. Calcification within the LAD coronary artery noted. Mediastinum/Nodes: Normal appearance of the thyroid gland. The trachea appears patent and is midline. Normal appearance of the esophagus. No enlarged mediastinal or hilar lymph nodes. Lungs/Pleura: No pleural effusion. Scattered pulmonary nodules are again noted. Index nodule within the anterior right upper lobe measures 1.2 cm, image  45/7. Stable from previous exam. The index perifissural nodule within the oblique fissure is stable measuring 4 mm. Other small scattered nodules remain unchanged. Musculoskeletal: No aggressive lytic or sclerotic bone lesions identified. Healed fracture deformity of the body of sternum is again noted and appears unchanged. CT ABDOMEN PELVIS FINDINGS Hepatobiliary: Within segment 7 of the liver there is a low-density lesion measuring 1.2 by 1.4 cm, image 43/2. Previously 1.5 x 1.9 cm. The lesion within segment 3 measures 1.7 x 1.8 cm, image 50/2. Previously 2.0 x 2.5 cm. Segment 2 liver lesion measures 1.2 cm, image 41/2. Previously 1.3  cm. No new or enlarging liver lesions. Stones identified within the gallbladder. No biliary dilatation. Pancreas: Normal appearance of the pancreas. Spleen: The spleen is unremarkable. Adrenals/Urinary Tract: The adrenal glands are both normal. Mild bilateral renal cortical lobulation noted. No mass or hydronephrosis identified. Urinary bladder is normal. Stomach/Bowel: The stomach is normal. The small bowel loops have a normal course and caliber. Normal appearance of the colon. Vascular/Lymphatic: Aortic atherosclerosis. No aneurysm. No adenopathy within the abdomen or pelvis. No inguinal adenopathy. Reproductive: Status post hysterectomy. No adnexal masses. Other: No abdominal wall hernia or abnormality. No abdominopelvic ascites. Musculoskeletal: Spondylosis noted within the lumbar spine. No aggressive lytic or sclerotic bone lesions. IMPRESSION: 1. Stable appearance of the chest. Multiple pulmonary nodules are unchanged in the interval. 2. Mild decrease in size of liver metastasis. 3. Aortic atherosclerosis with lad coronary artery atherosclerotic calcifications. Aortic Atherosclerosis (ICD10-I70.0). 4. Stable mild aneurysmal dilatation of the ascending thoracic aorta. Electronically Signed   By: Kerby Moors M.D.   On: 12/30/2017 16:19    ASSESSMENT AND PLAN:  This is a  very pleasant 84 years old white female with metastatic intermediate grade neuroendocrine carcinoma of questionable lung primary and multiple metastatic liver lesions and pancreatic lesions.She status post treatment with radio embolization with Y90 to the left and right lobe liver lesions.Status post treatment with Y 90 to the left and right lobes of the liver. She is currently undergoing systemic chemotherapy with Xeloda and Temodar status post 18 cycles. The patient continues to tolerate this treatment well with no concerning complaints. Repeat CT scan of the chest, abdomen and pelvis showed no concerning findings for disease progression.  I personally and independently reviewed the scans and I discussed the scan results with the patient and her husband. I recommended for the patient to continue her current treatment with Xeloda and Temodar and she will start cycle #19 on January 11, 2018. She will come back for follow-up visit in 1 month for evaluation and repeat blood work. She was advised to call immediately if she has any concerning symptoms in the interval. The patient voices understanding of current disease status and treatment options and is in agreement with the current care plan. All questions were answered. The patient knows to call the clinic with any problems, questions or concerns. We can certainly see the patient much sooner if necessary.  Disclaimer: This note was dictated with voice recognition software. Similar sounding words can inadvertently be transcribed and may not be corrected upon review.

## 2018-01-10 DIAGNOSIS — M1710 Unilateral primary osteoarthritis, unspecified knee: Secondary | ICD-10-CM | POA: Insufficient documentation

## 2018-01-10 DIAGNOSIS — M1711 Unilateral primary osteoarthritis, right knee: Secondary | ICD-10-CM | POA: Diagnosis not present

## 2018-01-10 DIAGNOSIS — M179 Osteoarthritis of knee, unspecified: Secondary | ICD-10-CM | POA: Insufficient documentation

## 2018-01-17 ENCOUNTER — Ambulatory Visit: Payer: Medicare Other | Admitting: Cardiology

## 2018-01-17 DIAGNOSIS — M1711 Unilateral primary osteoarthritis, right knee: Secondary | ICD-10-CM | POA: Diagnosis not present

## 2018-01-24 DIAGNOSIS — M1711 Unilateral primary osteoarthritis, right knee: Secondary | ICD-10-CM | POA: Diagnosis not present

## 2018-02-03 ENCOUNTER — Ambulatory Visit (INDEPENDENT_AMBULATORY_CARE_PROVIDER_SITE_OTHER): Payer: Medicare Other | Admitting: *Deleted

## 2018-02-03 DIAGNOSIS — R002 Palpitations: Secondary | ICD-10-CM

## 2018-02-03 MED FILL — TEMOZOLOMIDE 140 MG CAPSULE: 140 | 5 days supply | Qty: 10 | Fill #2

## 2018-02-03 MED FILL — CAPECITABINE 500 MG TABLET: 500 | 14 days supply | Qty: 84 | Fill #2

## 2018-02-03 NOTE — Assessment & Plan Note (Addendum)
This is a very pleasant 84 year old white female with metastatic intermediate grade neuroendocrine carcinoma of questionable lung primary and multiple metastatic liver lesions and pancreatic lesions.She status post treatment with radio embolization with Y90 to the left and right lobe liver lesions.Status post treatment with Y 90 to the left and right lobes of the liver. She is currently undergoing systemic chemotherapy with Xeloda and Temodar status post 19 cycles. The patient continues to tolerate this treatment well with no concerning complaints. Recommend for her to proceed with cycle #20 of Xeloda and Temodar as scheduled.  She will come back for follow-up visit in 1 month for evaluation and repeat blood work.  She was advised to call immediately if she has any concerning symptoms in the interval. The patient voices understanding of current disease status and treatment options and is in agreement with the current care plan. All questions were answered. The patient knows to call the clinic with any problems, questions or concerns. We can certainly see the patient much sooner if necessary.

## 2018-02-03 NOTE — Progress Notes (Signed)
[facility] OFFICE PROGRESS NOTE  [name], [place] [address]  DIAGNOSIS:  1) Metastatic intermediate. Neuroendocrine tumor of lung primary diagnosed in January 2018 and presented with small bilateral pulmonary nodules in addition to multiple liver metastasis. 2) right lower lobe pulmonary embolism diagnosed incidentally on CT scan of the chest on 04/23/2017  PRIOR THERAPY:  1) Status post radio embolization with Y 90 to the liver lesions by interventional radiology. 2) status post IVC filter placement by interventional radiology on 04/24/2017  CURRENT THERAPY: Xeloda 750 MG/M2 twice a day days 1-14 and Temodar 150 MG/M2 days 10-14 every 4 weeks. Status post 18 cycles. She will start cycle #20 on February 09, 2018.  INTERVAL HISTORY: [name] 84 y.o. female returns for routine follow-up visit accompanied by her husband.  The patient is feeling fine today and has no specific complaints.  She reports that her knee pain has improved following injections to her knees.  She denies fevers and chills.  Denies chest pain, shortness breath, cough, hemoptysis.  Denies nausea, vomiting, constipation, diarrhea.  Denies recent weight loss or night sweats.  The patient continues to tolerate treatment with Xeloda and Temodar fairly well.  The patient is here for evaluation and repeat lab work.  MEDICAL HISTORY: Past Medical History:  Diagnosis Date  . Acute respiratory failure with hypoxia ([place]) 10/23/2015  . Allergic rhinitis    PT. DENIES  . Anxiety   . Arthritis    NECK  . Ataxia   . Bradycardia    primarily nocturnal  . Burning tongue syndrome 25 years  . Cancer ([place]) dx'd 06/2016   liver  . Cataract   . Cerebellar degeneration   . Chronic pericarditis   . Chronic urinary tract infection   . Complication of anesthesia    low o2 sats, coded 30 years ago  . CVA (cerebral infarction) 05/2003  . Depression   . Encounter for antineoplastic  chemotherapy 10/09/2016  . Gait disorder   . Gastric polyps   . GERD (gastroesophageal reflux disease)   . Goals of care, counseling/discussion 10/09/2016  . High cholesterol   . Hyperlipidemia   . Hypotension   . Hypothyroidism   . IBS (irritable bowel syndrome)   . Obesity   . Paroxysmal atrial fibrillation ([name])    chads2vasc score is 6,  she is felt to be a poor candidate for anticoagulation  . Personal history of arterial venous malformation (AVM)    right side of face  . Seizure disorder ([place])   . Seizures ([place]) 2003   " smelling"- Gabapentin "no problem"  . Shortness of breath dyspnea    with exertion  . Sternum fx 10/27/2013  . Stroke [facility]) 5 years ago   Right side of face weak, slurred speach-   . Thyroid disease   . TIA (transient ischemic attack)   . UTI (lower urinary tract infection) 03/27/2016   "frequently"    ALLERGIES:  is allergic to lisinopril.  MEDICATIONS:  Current Outpatient Medications  Medication Sig Dispense Refill  . amitriptyline (ELAVIL) 25 MG tablet Take 1 tablet (25 mg total) by mouth at bedtime. 90 tablet 1  . capecitabine (XELODA) 500 MG tablet TAKE 3 TABLETS (1,500 MG TOTAL) BY MOUTH 2 (TWO) TIMES DAILY AFTER A MEAL. 84 tablet 2  . escitalopram (LEXAPRO) 20 MG tablet Take 1 tablet (20 mg total) by mouth at bedtime. 90 tablet 1  . fluticasone (FLONASE) 50 MCG/ACT nasal  spray Place 2 sprays into both nostrils at bedtime. 16 g 3  . furosemide (LASIX) 20 MG tablet TAKE 1 TABLET DAILY AS NEEDED 90 tablet 0  . gabapentin (NEURONTIN) 400 MG capsule Take 1-2 capsules (400-800 mg total) by mouth 3 (three) times daily. 720 capsule 1  . hydroxypropyl methylcellulose / hypromellose (ISOPTO TEARS / GONIOVISC) 2.5 % ophthalmic solution Place 1 drop into both eyes at bedtime.    Marland Kitchen levothyroxine (SYNTHROID, LEVOTHROID) 75 MCG tablet Take 1 tablet (75 mcg total) by mouth daily before breakfast. 90 tablet 1  . lovastatin (MEVACOR) 40 MG tablet Take 1 tablet (40  mg total) by mouth at bedtime. 90 tablet 1  . Melatonin 3 MG TBDP Take 3-6 mg by mouth at bedtime as needed. 60 tablet 1  . omeprazole (PRILOSEC) 40 MG capsule Take 1 capsule (40 mg total) by mouth daily. 90 capsule 1  . ondansetron (ZOFRAN) 8 MG tablet Take 1 tablet (8 mg total) by mouth every 8 (eight) hours as needed for nausea or vomiting. 90 tablet 1  . oxyCODONE-acetaminophen (PERCOCET) 5-325 MG tablet Take 1 tablet by mouth every 8 (eight) hours as needed. 30 tablet 0  . temozolomide (TEMODAR) 140 MG capsule TAKE 2 CAPSULES BY MOUTH DAILY. MAY TAKE ON AN EMPTY STOMACH OR AT BEDTIME TO DECREASE NAUSEA AND VOMITING 10 capsule 2  . XARELTO 20 MG TABS tablet TAKE 1 TABLET DAILY WITH SUPPER 90 tablet 0   No current facility-administered medications for this visit.     SURGICAL HISTORY:  Past Surgical History:  Procedure Laterality Date  . APPENDECTOMY  84 years old  . COLONOSCOPY  2006, 2009  . COLONOSCOPY WITH PROPOFOL N/A 03/28/2016   Procedure: COLONOSCOPY WITH PROPOFOL;  Surgeon: Gatha Mayer, MD;  Location: [facility];  Service: Endoscopy;  Laterality: N/A;  . cyst removed  35 years ago  . EP IMPLANTABLE DEVICE N/A 01/06/2015   Procedure: Loop Recorder Insertion;  Surgeon: Thompson Grayer, MD;  Location: [facility];  Service: Cardiovascular;  Laterality: N/A;  . EYE SURGERY Right    Cataract  . IR ANGIOGRAM SELECTIVE EACH ADDITIONAL VESSEL  11/28/2016  . IR ANGIOGRAM SELECTIVE EACH ADDITIONAL VESSEL  11/28/2016  . IR ANGIOGRAM SELECTIVE EACH ADDITIONAL VESSEL  11/28/2016  . IR ANGIOGRAM SELECTIVE EACH ADDITIONAL VESSEL  11/28/2016  . IR ANGIOGRAM SELECTIVE EACH ADDITIONAL VESSEL  12/13/2016  . IR ANGIOGRAM SELECTIVE EACH ADDITIONAL VESSEL  12/13/2016  . IR ANGIOGRAM SELECTIVE EACH ADDITIONAL VESSEL  01/09/2017  . IR ANGIOGRAM VISCERAL SELECTIVE  11/28/2016  . IR ANGIOGRAM VISCERAL SELECTIVE  11/28/2016  . IR ANGIOGRAM VISCERAL SELECTIVE  12/13/2016  . IR ANGIOGRAM VISCERAL SELECTIVE   12/13/2016  . IR ANGIOGRAM VISCERAL SELECTIVE  01/09/2017  . IR EMBO ARTERIAL NOT HEMORR HEMANG INC GUIDE ROADMAPPING  11/28/2016  . IR EMBO TUMOR ORGAN ISCHEMIA INFARCT INC GUIDE ROADMAPPING  12/13/2016  . IR EMBO TUMOR ORGAN ISCHEMIA INFARCT INC GUIDE ROADMAPPING  01/09/2017  . IR IVC FILTER PLMT / S&I /IMG GUID/MOD SED  04/24/2017  . IR RADIOLOGIST EVAL & MGMT  11/06/2016  . IR RADIOLOGIST EVAL & MGMT  01/02/2017  . IR RADIOLOGIST EVAL & MGMT  02/05/2017  . IR RADIOLOGIST EVAL & MGMT  05/02/2017  . IR RADIOLOGIST EVAL & MGMT  10/17/2017  . IR US GUIDE VASC ACCESS RIGHT  11/28/2016  . IR US GUIDE VASC ACCESS RIGHT  12/13/2016  . IR US GUIDE VASC ACCESS RIGHT  01/09/2017  .  KNEE ARTHROSCOPY Right 11/14/2006  . KNEE ARTHROSCOPY Bilateral 5 and 6 years ago  . KNEE ARTHROSCOPY WITH LATERAL MENISECTOMY  07/03/2012   Procedure: KNEE ARTHROSCOPY WITH LATERAL MENISECTOMY;  Surgeon: Magnus Sinning, MD;  Location: [facility];  Service: Orthopedics;  Laterality: Left;  with Partial Lateral Menisectomy and Medial Menisectomy. Shaving of medial and lateral femoral condyles. Shaving of patella. Removal of a loose body  . tibial and fibular internal fixation Left   . TOTAL ABDOMINAL HYSTERECTOMY  84 years old  . UPPER GASTROINTESTINAL ENDOSCOPY  2009, 2013    REVIEW OF SYSTEMS:   Review of Systems  Constitutional: Negative for appetite change, chills, fatigue, fever and unexpected weight change.  HENT:   Negative for mouth sores, nosebleeds, sore throat and trouble swallowing.   Eyes: Negative for eye problems and icterus.  Respiratory: Negative for cough, hemoptysis, shortness of breath and wheezing.   Cardiovascular: Negative for chest pain and leg swelling.  Gastrointestinal: Negative for abdominal pain, constipation, diarrhea, nausea and vomiting.  Genitourinary: Negative for bladder incontinence, difficulty urinating, dysuria, frequency and hematuria.   Musculoskeletal: Negative for back pain, neck pain and neck  stiffness.  Skin: Negative for itching and rash.  Neurological: Negative for dizziness, extremity weakness, headaches, light-headedness and seizures.  Hematological: Negative for adenopathy. Does not bruise/bleed easily.  Psychiatric/Behavioral: Negative for confusion, depression and sleep disturbance. The patient is not nervous/anxious.     PHYSICAL EXAMINATION:  Blood pressure 137/67, pulse 73, temperature 98.1 F (36.7 C), temperature source Oral, resp. rate 18, height 5\' 4"  (1.626 m), weight 226 lb 9.6 oz (102.8 kg), SpO2 99 %.  ECOG PERFORMANCE STATUS: 1 - Symptomatic but completely ambulatory  Physical Exam  Constitutional: Oriented to person, place, and time and well-developed, well-nourished, and in no distress. No distress.  HENT:  Head: Normocephalic and atraumatic.  Mouth/Throat: Oropharynx is clear and moist. No oropharyngeal exudate.  Eyes: Conjunctivae are normal. Right eye exhibits no discharge. Left eye exhibits no discharge. No scleral icterus.  Neck: Normal range of motion. Neck supple.  Cardiovascular: Normal rate, regular rhythm, normal heart sounds and intact distal pulses.   Pulmonary/Chest: Effort normal and breath sounds normal. No respiratory distress. No wheezes. No rales.  Abdominal: Soft. Bowel sounds are normal. Exhibits no distension and no mass. There is no tenderness.  Musculoskeletal: Normal range of motion. Exhibits no edema.  Lymphadenopathy:    No cervical adenopathy.  Neurological: Alert and oriented to person, place, and time. Exhibits normal muscle tone. Coordination normal.  Skin: Skin is warm and dry. No rash noted. Not diaphoretic. No erythema. No pallor.  Psychiatric: Mood, memory and judgment normal.  Vitals reviewed.  LABORATORY DATA: Lab Results  Component Value Date   WBC 4.2 02/04/2018   HGB 13.2 02/04/2018   HCT 39.8 02/04/2018   MCV 103.3 (H) 02/04/2018   PLT 118 (L) 02/04/2018      Chemistry      Component Value Date/Time    NA 136 02/04/2018 1433   NA 137 08/22/2017 1653   NA 135 (L) 07/10/2017 1142   K 4.4 02/04/2018 1433   K 4.6 07/10/2017 1142   CL 102 02/04/2018 1433   CO2 28 02/04/2018 1433   CO2 25 07/10/2017 1142   BUN 11 02/04/2018 1433   BUN 13 08/22/2017 1653   BUN 11.4 07/10/2017 1142   CREATININE 0.89 02/04/2018 1433   CREATININE 1.0 07/10/2017 1142      Component Value Date/Time   CALCIUM 9.8 02/04/2018  1433   CALCIUM 9.9 07/10/2017 1142   ALKPHOS 102 02/04/2018 1433   ALKPHOS 93 07/10/2017 1142   AST 20 02/04/2018 1433   AST 43 (H) 07/10/2017 1142   ALT 18 02/04/2018 1433   ALT 28 07/10/2017 1142   BILITOT 0.6 02/04/2018 1433   BILITOT 0.56 07/10/2017 1142       RADIOGRAPHIC STUDIES:  No results found.   ASSESSMENT/PLAN:  Neuroendocrine carcinoma metastatic to liver [facility]) This is a very pleasant 84 year old white female with metastatic intermediate grade neuroendocrine carcinoma of questionable lung primary and multiple metastatic liver lesions and pancreatic lesions.She status post treatment with radio embolization with Y90 to the left and right lobe liver lesions.Status post treatment with Y 90 to the left and right lobes of the liver. She is currently undergoing systemic chemotherapy with Xeloda and Temodar status post 19 cycles. The patient continues to tolerate this treatment well with no concerning complaints. Recommend for her to proceed with cycle #20 of Xeloda and Temodar as scheduled.  She will come back for follow-up visit in 1 month for evaluation and repeat blood work.  She was advised to call immediately if she has any concerning symptoms in the interval. The patient voices understanding of current disease status and treatment options and is in agreement with the current care plan. All questions were answered. The patient knows to call the clinic with any problems, questions or concerns. We can certainly see the patient much sooner if necessary.   Orders  Placed This Encounter  Procedures  . CBC with Differential (Cancer Center Only)    Standing Status:   Future    Standing Expiration Date:   02/05/2019  . CMP ([facility] only)    Standing Status:   Future    Standing Expiration Date:   02/05/2019   Mikey Bussing, DNP, AGPCNP-BC, AOCNP 02/04/18

## 2018-02-04 ENCOUNTER — Other Ambulatory Visit: Payer: Self-pay

## 2018-02-04 ENCOUNTER — Emergency Department (HOSPITAL_COMMUNITY)
Admission: EM | Admit: 2018-02-04 | Discharge: 2018-02-05 | Disposition: A | Discharge status: Home / Self Care | Payer: Medicare Other | Attending: Emergency Medicine | Admitting: Emergency Medicine

## 2018-02-04 ENCOUNTER — Emergency Department (HOSPITAL_COMMUNITY): Payer: Medicare Other

## 2018-02-04 ENCOUNTER — Telehealth: Payer: Self-pay | Admitting: Internal Medicine

## 2018-02-04 ENCOUNTER — Encounter: Payer: Self-pay | Admitting: Oncology

## 2018-02-04 ENCOUNTER — Inpatient Hospital Stay (HOSPITAL_BASED_OUTPATIENT_CLINIC_OR_DEPARTMENT_OTHER): Payer: Medicare Other | Admitting: Oncology

## 2018-02-04 ENCOUNTER — Inpatient Hospital Stay: Payer: Medicare Other | Attending: Internal Medicine

## 2018-02-04 ENCOUNTER — Encounter (HOSPITAL_COMMUNITY): Payer: Self-pay

## 2018-02-04 VITALS — BP 137/67 | HR 73 | Temp 98.1°F | Resp 18 | Ht 64.0 in | Wt 226.6 lb

## 2018-02-04 DIAGNOSIS — R0789 Other chest pain: Secondary | ICD-10-CM | POA: Diagnosis not present

## 2018-02-04 DIAGNOSIS — C7B8 Other secondary neuroendocrine tumors: Secondary | ICD-10-CM

## 2018-02-04 DIAGNOSIS — Z87891 Personal history of nicotine dependence: Secondary | ICD-10-CM | POA: Diagnosis not present

## 2018-02-04 DIAGNOSIS — J45909 Unspecified asthma, uncomplicated: Secondary | ICD-10-CM | POA: Insufficient documentation

## 2018-02-04 DIAGNOSIS — Z7901 Long term (current) use of anticoagulants: Secondary | ICD-10-CM | POA: Diagnosis not present

## 2018-02-04 DIAGNOSIS — I10 Essential (primary) hypertension: Secondary | ICD-10-CM | POA: Diagnosis not present

## 2018-02-04 DIAGNOSIS — R079 Chest pain, unspecified: Secondary | ICD-10-CM | POA: Diagnosis not present

## 2018-02-04 DIAGNOSIS — Z923 Personal history of irradiation: Secondary | ICD-10-CM | POA: Insufficient documentation

## 2018-02-04 DIAGNOSIS — Z5111 Encounter for antineoplastic chemotherapy: Secondary | ICD-10-CM

## 2018-02-04 DIAGNOSIS — E039 Hypothyroidism, unspecified: Secondary | ICD-10-CM | POA: Diagnosis not present

## 2018-02-04 DIAGNOSIS — C7A8 Other malignant neuroendocrine tumors: Secondary | ICD-10-CM

## 2018-02-04 DIAGNOSIS — Z79899 Other long term (current) drug therapy: Secondary | ICD-10-CM | POA: Insufficient documentation

## 2018-02-04 DIAGNOSIS — I4891 Unspecified atrial fibrillation: Secondary | ICD-10-CM | POA: Diagnosis not present

## 2018-02-04 DIAGNOSIS — C787 Secondary malignant neoplasm of liver and intrahepatic bile duct: Secondary | ICD-10-CM

## 2018-02-04 LAB — BASIC METABOLIC PANEL
ANION GAP: 8 (ref 5–15)
BUN: 13 mg/dL (ref 8–23)
CALCIUM: 9.3 mg/dL (ref 8.9–10.3)
CO2: 25 mmol/L (ref 22–32)
Chloride: 100 mmol/L (ref 98–111)
Creatinine, Ser: 0.75 mg/dL (ref 0.44–1.00)
GLUCOSE: 116 mg/dL — AB (ref 70–99)
POTASSIUM: 4.1 mmol/L (ref 3.5–5.1)
SODIUM: 133 mmol/L — AB (ref 135–145)

## 2018-02-04 LAB — CBC WITH DIFFERENTIAL (CANCER CENTER ONLY)
BASOS ABS: 0 10*3/uL (ref 0.0–0.1)
BASOS PCT: 1 %
Eosinophils Absolute: 0.2 10*3/uL (ref 0.0–0.5)
Eosinophils Relative: 4 %
HEMATOCRIT: 39.8 % (ref 34.8–46.6)
HEMOGLOBIN: 13.2 g/dL (ref 11.6–15.9)
Lymphocytes Relative: 16 %
Lymphs Abs: 0.7 10*3/uL — ABNORMAL LOW (ref 0.9–3.3)
MCH: 34.2 pg — ABNORMAL HIGH (ref 25.1–34.0)
MCHC: 33.1 g/dL (ref 31.5–36.0)
MCV: 103.3 fL — ABNORMAL HIGH (ref 79.5–101.0)
Monocytes Absolute: 0.6 10*3/uL (ref 0.1–0.9)
Monocytes Relative: 14 %
NEUTROS PCT: 65 %
Neutro Abs: 2.8 10*3/uL (ref 1.5–6.5)
Platelet Count: 118 10*3/uL — ABNORMAL LOW (ref 145–400)
RBC: 3.86 MIL/uL (ref 3.70–5.45)
RDW: 19.5 % — ABNORMAL HIGH (ref 11.2–14.5)
WBC Count: 4.2 10*3/uL (ref 3.9–10.3)

## 2018-02-04 LAB — CBC
HCT: 39.2 % (ref 36.0–46.0)
HEMOGLOBIN: 12.9 g/dL (ref 12.0–15.0)
MCH: 33.8 pg (ref 26.0–34.0)
MCHC: 32.9 g/dL (ref 30.0–36.0)
MCV: 102.6 fL — ABNORMAL HIGH (ref 78.0–100.0)
Platelets: 112 10*3/uL — ABNORMAL LOW (ref 150–400)
RBC: 3.82 MIL/uL — ABNORMAL LOW (ref 3.87–5.11)
RDW: 17.3 % — ABNORMAL HIGH (ref 11.5–15.5)
WBC: 5.9 10*3/uL (ref 4.0–10.5)

## 2018-02-04 LAB — CMP (CANCER CENTER ONLY)
ALBUMIN: 3.7 g/dL (ref 3.5–5.0)
ALK PHOS: 102 U/L (ref 38–126)
ALT: 18 U/L (ref 0–44)
AST: 20 U/L (ref 15–41)
Anion gap: 6 (ref 5–15)
BILIRUBIN TOTAL: 0.6 mg/dL (ref 0.3–1.2)
BUN: 11 mg/dL (ref 8–23)
CALCIUM: 9.8 mg/dL (ref 8.9–10.3)
CO2: 28 mmol/L (ref 22–32)
Chloride: 102 mmol/L (ref 98–111)
Creatinine: 0.89 mg/dL (ref 0.44–1.00)
GFR, Est AFR Am: >60 mL/min (ref >60)
GFR, Estimated: 59 mL/min — ABNORMAL LOW (ref >60)
GLUCOSE: 100 mg/dL — AB (ref 70–99)
Potassium: 4.4 mmol/L (ref 3.5–5.1)
Sodium: 136 mmol/L (ref 135–145)
TOTAL PROTEIN: 6.8 g/dL (ref 6.5–8.1)

## 2018-02-04 LAB — TROPONIN I

## 2018-02-04 MED ORDER — TEMOZOLOMIDE 140 MG PO CAPS: ORAL_CAPSULE | ORAL | 2 refills | Status: DC

## 2018-02-04 MED ORDER — CAPECITABINE 500 MG PO TABS: ORAL_TABLET | ORAL | 2 refills | Status: DC

## 2018-02-04 NOTE — ED Triage Notes (Signed)
EMS called out for chest pain, upon arrival pt reports cp had resolved; Pain lasted about 10 minutes per pt. Pt denies pain now, just wanted to be checked out. Pt says she thought it may have been indigestion, but she never has that.  Denies SOB, nausea, and vomiting.

## 2018-02-04 NOTE — ED Notes (Signed)
Pt in x-ray at this time

## 2018-02-04 NOTE — Telephone Encounter (Signed)
Appointments scheduled AVS/Calendar printed per 7/9 los

## 2018-02-05 ENCOUNTER — Emergency Department (HOSPITAL_COMMUNITY): Payer: Medicare Other

## 2018-02-05 DIAGNOSIS — R079 Chest pain, unspecified: Secondary | ICD-10-CM | POA: Diagnosis not present

## 2018-02-05 DIAGNOSIS — R002 Palpitations: Secondary | ICD-10-CM

## 2018-02-05 LAB — TROPONIN I

## 2018-02-05 MED ORDER — IOPAMIDOL (ISOVUE-370) INJECTION 76%
100.0000 mL | Freq: Once | INTRAVENOUS | Status: AC | PRN
  Administered 2018-02-05: 100 mL via INTRAVENOUS

## 2018-02-05 NOTE — ED Provider Notes (Signed)
[facility] EMERGENCY DEPARTMENT Provider Note   CSN: [id number] Arrival date & time: 02/04/18  2053  Time seen 12:10 AM   History   Chief Complaint Chief Complaint  Patient presents with  . Chest Pain    HPI [name] is a 84 y.o. female.  HPI   patient states about 8 PM tonight she was sitting in the recliner watching TV and had acute onset of left-sided chest pain that then became centrally located and she indicates up and down her sternum.  She states it was aching in nature and last 10 to 15 minutes.  She did take aspirin 324 mg chewed as recommended by 911 and states that just as quickly as the pain occurred it left.  Her husband stated he did not think she had time for the aspirin to have made the pain go away.  She states the pain lasted 10 to 15 minutes.  It was so bad it made her tearful.  She states she felt cold inside and felt like she was going to pass out.  She denies nausea or vomiting and states she is chronically short of breath and it did not seem worse.  She denies diaphoresis.  She states she is never had this pain before.  Patient has a history of atrial fib and states she has a loop recorder.  She states she was told she had 6-second pauses but they did not recommend a pacemaker for her.  She has a history of multiple strokes and actually had a PE and states she has a filter and is on Xarelto which she states she is compliant and takes it daily.  She states the PE was picked up on a incidental CT scan done in evaluation of her oncology treatment.  She denies having symptoms.  She also states she was diagnosed of lung cancer,, a neuroendocrine tumor and has mets to her liver.  She has had radiation therapy and is currently taking oral chemotherapy pills that she takes for 2 weeks and then is off for 2 weeks and rotation (Xeloda and Temodar)  PCP Eustaquio Maize, MD Cardiology Dr Rayann Heman Oncology Dr [name]  Past Medical History:  Diagnosis Date  . Acute  respiratory failure with hypoxia ([place]) 10/23/2015  . Allergic rhinitis    PT. DENIES  . Anxiety   . Arthritis    NECK  . Ataxia   . Bradycardia    primarily nocturnal  . Burning tongue syndrome 25 years  . Cancer ([place]) dx'd 06/2016   liver  . Cataract   . Cerebellar degeneration   . Chronic pericarditis   . Chronic urinary tract infection   . Complication of anesthesia    low o2 sats, coded 30 years ago  . CVA (cerebral infarction) 05/2003  . Depression   . Encounter for antineoplastic chemotherapy 10/09/2016  . Gait disorder   . Gastric polyps   . GERD (gastroesophageal reflux disease)   . Goals of care, counseling/discussion 10/09/2016  . High cholesterol   . Hyperlipidemia   . Hypotension   . Hypothyroidism   . IBS (irritable bowel syndrome)   . Obesity   . Paroxysmal atrial fibrillation ([name])    chads2vasc score is 6,  she is felt to be a poor candidate for anticoagulation  . Personal history of arterial venous malformation (AVM)    right side of face  . Seizure disorder ([place])   . Seizures ([place]) 2003   " smelling"- Gabapentin "no problem"  .  Shortness of breath dyspnea    with exertion  . Sternum fx 10/27/2013  . Stroke [facility]) 5 years ago   Right side of face weak, slurred speach-   . Thyroid disease   . TIA (transient ischemic attack)   . UTI (lower urinary tract infection) 03/27/2016   "frequently"    Patient Active Problem List   Diagnosis Date Noted  . Hypotension 12/19/2017  . Pulmonary embolism ([place]) 04/24/2017  . Pulmonary embolus ([place]) 04/23/2017  . Neuroendocrine carcinoma metastatic to liver ([place]) 12/13/2016  . Back pain 12/13/2016  . Chest pain 12/13/2016  . Goals of care, counseling/discussion 10/09/2016  . Encounter for antineoplastic chemotherapy 10/09/2016  . Neuroendocrine cancer ([place]) 08/23/2016  . Liver metastases ([place]) 08/23/2016  . Late effect of cerebrovascular accident (CVA) 08/02/2016  . H/O: CVA (cerebrovascular accident) 07/14/2016   . Meningioma ([place]) 07/14/2016  . Asthmatic bronchitis 10/23/2015  . GAD (generalized anxiety disorder) 09/30/2015  . Peripheral edema 09/30/2015  . Insomnia 09/30/2015  . Localization-related partial epilepsy with simple partial seizures ([place]) 06/27/2015  . Allergic rhinitis 05/24/2015  . Syncope 01/06/2015  . Sinus bradycardia 01/03/2015  . Paroxysmal atrial fibrillation ([place]) 01/03/2015  . Fatigue 01/03/2015  . Morbid obesity ([place]) 01/03/2015  . Solitary pulmonary nodule 06/02/2014  . Thyroid nodule 05/08/2014  . Palpitation 05/07/2014  . Depression   . Seizure disorder ([place])   . Hypothyroidism   . Cerebellar degeneration   . IRRITABLE BOWEL SYNDROME 02/18/2008  . Hyperlipemia 09/10/2006  . Essential hypertension 09/10/2006  . GERD 09/10/2006  . CVA (cerebral infarction) 05/31/2003    Past Surgical History:  Procedure Laterality Date  . APPENDECTOMY  84 years old  . COLONOSCOPY  2006, 2009  . COLONOSCOPY WITH PROPOFOL N/A 03/28/2016   Procedure: COLONOSCOPY WITH PROPOFOL;  Surgeon: Gatha Mayer, MD;  Location: [place];  Service: Endoscopy;  Laterality: N/A;  . cyst removed  35 years ago  . EP IMPLANTABLE DEVICE N/A 01/06/2015   Procedure: Loop Recorder Insertion;  Surgeon: Thompson Grayer, MD;  Location: [place] [facility];  Service: Cardiovascular;  Laterality: N/A;  . EYE SURGERY Right    Cataract  . IR ANGIOGRAM SELECTIVE EACH ADDITIONAL VESSEL  11/28/2016  . IR ANGIOGRAM SELECTIVE EACH ADDITIONAL VESSEL  11/28/2016  . IR ANGIOGRAM SELECTIVE EACH ADDITIONAL VESSEL  11/28/2016  . IR ANGIOGRAM SELECTIVE EACH ADDITIONAL VESSEL  11/28/2016  . IR ANGIOGRAM SELECTIVE EACH ADDITIONAL VESSEL  12/13/2016  . IR ANGIOGRAM SELECTIVE EACH ADDITIONAL VESSEL  12/13/2016  . IR ANGIOGRAM SELECTIVE EACH ADDITIONAL VESSEL  01/09/2017  . IR ANGIOGRAM VISCERAL SELECTIVE  11/28/2016  . IR ANGIOGRAM VISCERAL SELECTIVE  11/28/2016  . IR ANGIOGRAM VISCERAL SELECTIVE  12/13/2016  . IR ANGIOGRAM VISCERAL  SELECTIVE  12/13/2016  . IR ANGIOGRAM VISCERAL SELECTIVE  01/09/2017  . IR EMBO ARTERIAL NOT HEMORR HEMANG INC GUIDE ROADMAPPING  11/28/2016  . IR EMBO TUMOR ORGAN ISCHEMIA INFARCT INC GUIDE ROADMAPPING  12/13/2016  . IR EMBO TUMOR ORGAN ISCHEMIA INFARCT INC GUIDE ROADMAPPING  01/09/2017  . IR IVC FILTER PLMT / S&I /IMG GUID/MOD SED  04/24/2017  . IR RADIOLOGIST EVAL & MGMT  11/06/2016  . IR RADIOLOGIST EVAL & MGMT  01/02/2017  . IR RADIOLOGIST EVAL & MGMT  02/05/2017  . IR RADIOLOGIST EVAL & MGMT  05/02/2017  . IR RADIOLOGIST EVAL & MGMT  10/17/2017  . IR US GUIDE VASC ACCESS RIGHT  11/28/2016  . IR US GUIDE VASC ACCESS RIGHT  12/13/2016  .  IR US GUIDE VASC ACCESS RIGHT  01/09/2017  . KNEE ARTHROSCOPY Right 11/14/2006  . KNEE ARTHROSCOPY Bilateral 5 and 6 years ago  . KNEE ARTHROSCOPY WITH LATERAL MENISECTOMY  07/03/2012   Procedure: KNEE ARTHROSCOPY WITH LATERAL MENISECTOMY;  Surgeon: Magnus Sinning, MD;  Location: [facility];  Service: Orthopedics;  Laterality: Left;  with Partial Lateral Menisectomy and Medial Menisectomy. Shaving of medial and lateral femoral condyles. Shaving of patella. Removal of a loose body  . tibial and fibular internal fixation Left   . TOTAL ABDOMINAL HYSTERECTOMY  84 years old  . UPPER GASTROINTESTINAL ENDOSCOPY  2009, 2013     OB History   None      Home Medications    Prior to Admission medications   Medication Sig Start Date End Date Taking? Authorizing Provider  amitriptyline (ELAVIL) 25 MG tablet Take 1 tablet (25 mg total) by mouth at bedtime. 08/08/17  Yes Eustaquio Maize, MD  capecitabine (XELODA) 500 MG tablet TAKE 3 TABLETS (1,500 MG TOTAL) BY MOUTH 2 (TWO) TIMES DAILY AFTER A MEAL. Patient taking differently: Take 1,500 mg by mouth 2 (two) times daily after a meal. TAKE 3 TABLETS (1,500 MG TOTAL) BY MOUTH 2 (TWO) TIMES DAILY AFTER A MEAL FOR 14 DAYS AS DIRECTED 02/04/18  Yes Curcio, Roselie Awkward, NP  escitalopram (LEXAPRO) 20 MG tablet Take 1 tablet (20 mg total)  by mouth at bedtime. 08/08/17  Yes Eustaquio Maize, MD  fluticasone (FLONASE) 50 MCG/ACT nasal spray Place 2 sprays into both nostrils at bedtime. 08/08/17  Yes Eustaquio Maize, MD  furosemide (LASIX) 20 MG tablet TAKE 1 TABLET DAILY AS NEEDED Patient taking differently: TAKE 1 TABLET DAILY AS NEEDED FOR FLUID RETENTION 12/25/17  Yes Eustaquio Maize, MD  gabapentin (NEURONTIN) 400 MG capsule Take 1-2 capsules (400-800 mg total) by mouth 3 (three) times daily. 08/08/17  Yes Eustaquio Maize, MD  hydroxypropyl methylcellulose / hypromellose (ISOPTO TEARS / GONIOVISC) 2.5 % ophthalmic solution Place 1 drop into both eyes at bedtime.   Yes [provider]  levothyroxine (SYNTHROID, LEVOTHROID) 75 MCG tablet Take 1 tablet (75 mcg total) by mouth daily before breakfast. 08/08/17  Yes Eustaquio Maize, MD  lovastatin (MEVACOR) 40 MG tablet Take 1 tablet (40 mg total) by mouth at bedtime. 08/08/17  Yes Eustaquio Maize, MD  Melatonin 3 MG TBDP Take 3-6 mg by mouth at bedtime as needed. Patient taking differently: Take 3-6 mg by mouth at bedtime as needed (FOR SLEEP).  08/02/16  Yes Eustaquio Maize, MD  omeprazole (PRILOSEC) 40 MG capsule Take 1 capsule (40 mg total) by mouth daily. 08/08/17  Yes Eustaquio Maize, MD  ondansetron (ZOFRAN) 8 MG tablet Take 1 tablet (8 mg total) by mouth every 8 (eight) hours as needed for nausea or vomiting. 08/08/17  Yes Eustaquio Maize, MD  oxyCODONE-acetaminophen (PERCOCET) 5-325 MG tablet Take 1 tablet by mouth every 8 (eight) hours as needed. 08/13/17  Yes Curt Bears, MD  temozolomide (TEMODAR) 140 MG capsule TAKE 2 CAPSULES BY MOUTH DAILY. MAY TAKE ON AN EMPTY STOMACH OR AT BEDTIME TO DECREASE NAUSEA AND VOMITING Patient taking differently: Take 280 mg by mouth daily. TAKE 2 CAPSULES BY MOUTH DAILY. MAY TAKE ON AN EMPTY STOMACH OR AT BEDTIME TO DECREASE NAUSEA AND VOMITING *TO START 5 DAYS PRIOR TO COMPLETION OF 14 DAY COURSE OF XELODA REGIMEN AS DIRECTED* 02/04/18   Yes Curcio, Roselie Awkward, NP  XARELTO 20 MG TABS tablet  TAKE 1 TABLET DAILY WITH SUPPER 11/26/17  Yes Curt Bears, MD    Family History Family History  Problem Relation Age of Onset  . Heart attack Father 105       fatal  . Coronary artery disease Brother   . Diabetes Brother   . Prostate cancer Brother   . Prostate cancer Son     Social History Social History   Tobacco Use  . Smoking status: Former Smoker    Packs/day: 0.25    Years: 10.00    Pack years: 2.50    Types: Cigarettes    Last attempt to quit: 07/31/1975    Years since quitting: 42.5  . Smokeless tobacco: Never Used  Substance Use Topics  . Alcohol use: No    Comment: Rare- maybe a drink ever 2 years  . Drug use: No     Allergies   Lisinopril   Review of Systems Review of Systems  All other systems reviewed and are negative.    Physical Exam Updated Vital Signs BP 121/64   Pulse 67   Temp (!) 97.5 F (36.4 C) (Oral)   Resp 16   Ht 5\' 4"  (1.626 m)   Wt 102.5 kg (226 lb)   SpO2 96%   BMI 38.79 kg/m   Vital signs normal    Physical Exam  Constitutional: She is oriented to person, place, and time. She appears well-developed and well-nourished.  Non-toxic appearance. She does not appear ill. No distress.  HENT:  Head: Normocephalic and atraumatic.  Right Ear: External ear normal.  Left Ear: External ear normal.  Nose: Nose normal. No mucosal edema or rhinorrhea.  Mouth/Throat: Oropharynx is clear and moist and mucous membranes are normal. No dental abscesses or uvula swelling.  Eyes: Pupils are equal, round, and reactive to light. Conjunctivae and EOM are normal.  Neck: Normal range of motion and full passive range of motion without pain. Neck supple.  Cardiovascular: Normal rate, regular rhythm and normal heart sounds. Exam reveals no gallop and no friction rub.  No murmur heard. Pulmonary/Chest: Effort normal and breath sounds normal. No respiratory distress. She has no wheezes. She has  no rhonchi. She has no rales. She exhibits no tenderness and no crepitus.  Abdominal: Soft. Normal appearance and bowel sounds are normal. She exhibits no distension. There is no tenderness. There is no rebound and no guarding.  Musculoskeletal: Normal range of motion. She exhibits no edema or tenderness.  Moves all extremities well.   Neurological: She is alert and oriented to person, place, and time. She has normal strength. No cranial nerve deficit.  Skin: Skin is warm, dry and intact. No rash noted. No erythema. No pallor.  Psychiatric: She has a normal mood and affect. Her speech is normal and behavior is normal. Her mood appears not anxious.  Nursing note and vitals reviewed.    ED Treatments / Results  Labs (all labs ordered are listed, but only abnormal results are displayed) Results for orders placed or performed during the hospital encounter of 67/12/45  Basic metabolic panel  Result Value Ref Range   Sodium 133 (L) 135 - 145 mmol/L   Potassium 4.1 3.5 - 5.1 mmol/L   Chloride 100 98 - 111 mmol/L   CO2 25 22 - 32 mmol/L   Glucose, Bld 116 (H) 70 - 99 mg/dL   BUN 13 8 - 23 mg/dL   Creatinine, Ser 0.75 0.44 - 1.00 mg/dL   Calcium 9.3 8.9 - 10.3 mg/dL  GFR calc non Af Amer >60 >60 mL/min   GFR calc Af Amer >60 >60 mL/min   Anion gap 8 5 - 15  CBC  Result Value Ref Range   WBC 5.9 4.0 - 10.5 K/uL   RBC 3.82 (L) 3.87 - 5.11 MIL/uL   Hemoglobin 12.9 12.0 - 15.0 g/dL   HCT 39.2 36.0 - 46.0 %   MCV 102.6 (H) 78.0 - 100.0 fL   MCH 33.8 26.0 - 34.0 pg   MCHC 32.9 30.0 - 36.0 g/dL   RDW 17.3 (H) 11.5 - 15.5 %   Platelets 112 (L) 150 - 400 K/uL  Troponin I  Result Value Ref Range   Troponin I <0.03 <0.03 ng/mL  Troponin I  Result Value Ref Range   Troponin I <0.03 <0.03 ng/mL   Laboratory interpretation all normal including negative delta troponin    EKG EKG Interpretation  Date/Time:  Tuesday February 04 2018 21:03:40 EDT Ventricular Rate:  75 PR Interval:    QRS  Duration: 94 QT Interval:  397 QTC Calculation: 444 R Axis:   45 Text Interpretation:  Sinus rhythm Low voltage, precordial leads Electrode noise No significant change since last tracing 24 Apr 2017 Confirmed by Rolland Porter [phone]) on 02/04/2018 10:59:54 PM   Radiology Dg Chest 2 View  Result Date: 02/04/2018 CLINICAL DATA:  Mid to left lower chest pain lasting 10 minutes prior to arrival. EXAM: CHEST - 2 VIEW COMPARISON:  None. FINDINGS: The heart size and mediastinal contours are within normal limits. A loop recording device projects over the cardiac silhouette. There is moderate aortic atherosclerosis without aneurysmal dilatation. Degenerative changes are noted along dorsal spine. There is minimal atelectasis at the right lung base. No pulmonary consolidation, effusion or pneumothorax. No overt pulmonary edema. IMPRESSION: No active cardiopulmonary disease.  Aortic atherosclerosis. Electronically Signed   By: Ashley Royalty M.D.   On: 02/04/2018 23:43   Ct Angio Chest Pe W/cm &/or Wo Cm  Result Date: 02/05/2018 CLINICAL DATA:  Episode of chest pain tonight, now resolved. History of pulmonary embolism EXAM: CT ANGIOGRAPHY CHEST WITH CONTRAST TECHNIQUE: Multidetector CT imaging of the chest was performed using the standard protocol during bolus administration of intravenous contrast. Multiplanar CT image reconstructions and MIPs were obtained to evaluate the vascular anatomy. CONTRAST:  181mL ISOVUE-370 IOPAMIDOL (ISOVUE-370) INJECTION 76% COMPARISON:  12/30/2017 FINDINGS: Cardiovascular: Satisfactory opacification of the pulmonary arteries to the segmental level. No evidence of pulmonary embolism. Borderline heart size. No pericardial effusion. Atherosclerotic calcification of the aorta and coronaries. Mediastinum/Nodes: Negative for adenopathy. Lungs/Pleura: Nodule within a band of opacity in the right upper lobe is unchanged with nodular component measuring 12 mm on axial slices. There are additional  smaller pulmonary nodules. Expiratory phase imaging with patchy opacity consistent with atelectasis. Air trapping is seen at the left apex. Upper Abdomen: 3 liver metastases which are difficult to compare to prior given differences in contrast timing. The liver is essentially non enhanced today. Cholelithiasis. Musculoskeletal: Exaggerated thoracic kyphosis. Remote sternal body fracture. No acute finding Review of the MIP images confirms the above findings. IMPRESSION: 1. Negative for pulmonary embolism or other acute finding. 2. Aortic Atherosclerosis (ICD10-I70.0).  Coronary atherosclerosis. 3. Metastatic lung cancer with staging scans obtained last month. 4. Cholelithiasis. Electronically Signed   By: Monte Fantasia M.D.   On: 02/05/2018 01:32    Procedures Procedures (including critical care time)  Medications Ordered in ED Medications  iopamidol (ISOVUE-370) 76 % injection 100 mL (100 mLs Intravenous Contrast  Given 02/05/18 0101)     Initial Impression / Assessment and Plan / ED Course  I have reviewed the triage vital signs and the nursing notes.  Pertinent labs & imaging results that were available during my care of the patient were reviewed by me and considered in my medical decision making (see chart for details).    Patient has a history of PE however she has a IVC filter and is on Xarelto, however she also has cancer so she is at increased risk for pulmonary embolus.  CTA was done.  Patient's initial troponin was negative, a delta troponin was done and was done about 6 hours after her pain started.  Her delta troponin was negative.  She had no further episodes of chest pain while in the ED.  At this point it was felt patient could be discharged home.  She should call her cardiologist office in the morning to let them know about her ED visit, her cardiologist may want to have her seen in the office soon.  She was advised to return to the ED if she gets chest pain again.   Final  Clinical Impressions(s) / ED Diagnoses   Final diagnoses:  Chest pain, unspecified type    ED Discharge Orders    None      Plan discharge  Rolland Porter, MD, Barbette Or, MD 02/05/18 [phone]

## 2018-02-05 NOTE — Progress Notes (Signed)
Carelink Summary Report / Loop Recorder 

## 2018-02-05 NOTE — ED Notes (Signed)
Pt returned from CT °

## 2018-02-05 NOTE — Discharge Instructions (Addendum)
Your tests tonight show no evidence of a heart attack and no evidence of a blood clot in your lungs, pneumonia. Please call Dr Jackalyn Lombard office in the morning to let him know about your ED visit tonight. He may want to have you seen in the office soon.  Recheck if you get chest pain again.

## 2018-02-07 ENCOUNTER — Encounter: Payer: Self-pay | Admitting: Pediatrics

## 2018-02-07 ENCOUNTER — Ambulatory Visit (INDEPENDENT_AMBULATORY_CARE_PROVIDER_SITE_OTHER): Payer: Medicare Other | Admitting: Pediatrics

## 2018-02-07 VITALS — BP 132/76 | HR 71 | Temp 97.3°F | Ht 64.0 in | Wt 228.4 lb

## 2018-02-07 DIAGNOSIS — R51 Headache: Secondary | ICD-10-CM

## 2018-02-07 DIAGNOSIS — K219 Gastro-esophageal reflux disease without esophagitis: Secondary | ICD-10-CM

## 2018-02-07 DIAGNOSIS — F32A Depression, unspecified: Secondary | ICD-10-CM

## 2018-02-07 DIAGNOSIS — E038 Other specified hypothyroidism: Secondary | ICD-10-CM

## 2018-02-07 DIAGNOSIS — R609 Edema, unspecified: Secondary | ICD-10-CM

## 2018-02-07 DIAGNOSIS — F411 Generalized anxiety disorder: Secondary | ICD-10-CM | POA: Diagnosis not present

## 2018-02-07 DIAGNOSIS — F329 Major depressive disorder, single episode, unspecified: Secondary | ICD-10-CM

## 2018-02-07 DIAGNOSIS — E785 Hyperlipidemia, unspecified: Secondary | ICD-10-CM

## 2018-02-07 DIAGNOSIS — R519 Headache, unspecified: Secondary | ICD-10-CM

## 2018-02-07 MED ORDER — LOVASTATIN 40 MG PO TABS: 40.0000 mg | ORAL_TABLET | Freq: Every day | ORAL | 1 refills | Status: DC

## 2018-02-07 MED ORDER — OMEPRAZOLE 40 MG PO CPDR: 40.0000 mg | DELAYED_RELEASE_CAPSULE | Freq: Every day | ORAL | 1 refills | Status: DC

## 2018-02-07 MED ORDER — ONDANSETRON HCL 8 MG PO TABS: 8.0000 mg | ORAL_TABLET | Freq: Three times a day (TID) | ORAL | 1 refills | Status: DC | PRN

## 2018-02-07 MED ORDER — GABAPENTIN 400 MG PO CAPS: 400.0000 mg | ORAL_CAPSULE | Freq: Three times a day (TID) | ORAL | 1 refills | Status: DC

## 2018-02-07 MED ORDER — ESCITALOPRAM OXALATE 20 MG PO TABS: 20.0000 mg | ORAL_TABLET | Freq: Every day | ORAL | 1 refills | Status: DC

## 2018-02-07 MED ORDER — AMITRIPTYLINE HCL 25 MG PO TABS: 25.0000 mg | ORAL_TABLET | Freq: Every day | ORAL | 1 refills | Status: DC

## 2018-02-07 MED ORDER — LEVOTHYROXINE SODIUM 75 MCG PO TABS: 75.0000 ug | ORAL_TABLET | Freq: Every day | ORAL | 1 refills | Status: DC

## 2018-02-07 MED ORDER — FUROSEMIDE 20 MG PO TABS: ORAL_TABLET | ORAL | 0 refills | Status: DC

## 2018-02-07 NOTE — Progress Notes (Signed)
  Subjective:   Patient ID: [name], female    DOB: 1934-03-15, 84 y.o.   MRN: [id number] CC: Medical Management of Chronic Issues  HPI: [name] is a 84 y.o. female   Depression: mood has been ok, taking medicine regularly  L sided facial pain: lasts 5-10 seconds, starts often when exerting herself but not always, getting into the bathtub, getting into wheelchair. Ongoing for months. Sometimes happens once a day, up to 4-6 times a day.   R ear pain: feels full, no fevers, some congestion  Recent Ed visit for episode of chest pain: forgot xarelto for about two days prior to episodes, severe chest tightness that started slowly over about 5 minutes, was severe for 10 minutes, improving by the time EMS arrived. Nl troponin in ED. Taking blood thinner regularly now.  Relevant past medical, surgical, family and social history reviewed. Allergies and medications reviewed and updated. Social History   Tobacco Use  Smoking Status Former Smoker  . Packs/day: 0.25  . Years: 10.00  . Pack years: 2.50  . Types: Cigarettes  . Last attempt to quit: 07/31/1975  . Years since quitting: 42.5  Smokeless Tobacco Never Used   ROS: Per HPI   Objective:    BP 132/76   Pulse 71   Temp (!) 97.3 F (36.3 C) (Oral)   Ht 5\' 4"  (1.626 m)   Wt 228 lb 6.4 oz (103.6 kg)   BMI 39.20 kg/m   Wt Readings from Last 3 Encounters:  02/07/18 228 lb 6.4 oz (103.6 kg)  02/04/18 226 lb (102.5 kg)  02/04/18 226 lb 9.6 oz (102.8 kg)    Gen: NAD, alert, in wheelchair, cooperative with exam, NCAT EYES: EOMI, no conjunctival injection, or no icterus ENT:  TMs pearly gray b/l, OP without erythema LYMPH: no cervical LAD CV: NRRR, normal S1/S2 Resp: CTABL, no wheezes, normal WOB Ext: No edema, warm Neuro: Alert and oriented  Assessment & Plan:  Shanora was seen today for medical management of chronic issues.  Diagnoses and all orders for this visit:  Depression, unspecified depression  type Stable, cont below      escitalopram (LEXAPRO) 20 MG tablet; Take 1 tablet (20 mg total) by mouth at bedtime.  GAD (generalized anxiety disorder) stable -     escitalopram (LEXAPRO) 20 MG tablet; Take 1 tablet (20 mg total) by mouth at bedtime.  Swelling -     furosemide (LASIX) 20 MG tablet; TAKE 1 TABLET DAILY AS NEEDED FOR FLUID RETENTION  Other specified hypothyroidism Stable, cont below -     levothyroxine (SYNTHROID, LEVOTHROID) 75 MCG tablet; Take 1 tablet (75 mcg total) by mouth daily before breakfast.  Hyperlipemia Stable, cont below -     lovastatin (MEVACOR) 40 MG tablet; Take 1 tablet (40 mg total) by mouth at bedtime.  Gastroesophageal reflux disease, esophagitis presence not specified -     omeprazole (PRILOSEC) 40 MG capsule; Take 1 capsule (40 mg total) by mouth daily.  Facial pain Has had normal imaging, h/o AVM R side of face per pt, no recent flares/swellings -     Ambulatory referral to Neurology  Follow up plan: Return in about 3 months (around 05/10/2018). Assunta Found, MD [place]

## 2018-02-10 LAB — CUP PACEART REMOTE DEVICE CHECK
Date Time Interrogation Session: 20190607013916
Implantable Pulse Generator Implant Date: 20160609

## 2018-02-13 ENCOUNTER — Other Ambulatory Visit: Payer: Self-pay | Admitting: Internal Medicine

## 2018-02-21 ENCOUNTER — Ambulatory Visit (INDEPENDENT_AMBULATORY_CARE_PROVIDER_SITE_OTHER): Payer: Medicare Other | Admitting: Neurology

## 2018-02-21 ENCOUNTER — Encounter: Payer: Self-pay | Admitting: Neurology

## 2018-02-21 VITALS — BP 104/56 | HR 72 | Ht 64.0 in | Wt 225.0 lb

## 2018-02-21 DIAGNOSIS — M26622 Arthralgia of left temporomandibular joint: Secondary | ICD-10-CM

## 2018-02-21 DIAGNOSIS — R51 Headache: Secondary | ICD-10-CM

## 2018-02-21 DIAGNOSIS — G118 Other hereditary ataxias: Secondary | ICD-10-CM

## 2018-02-21 DIAGNOSIS — G40109 Localization-related (focal) (partial) symptomatic epilepsy and epileptic syndromes with simple partial seizures, not intractable, without status epilepticus: Secondary | ICD-10-CM | POA: Diagnosis not present

## 2018-02-21 DIAGNOSIS — R519 Headache, unspecified: Secondary | ICD-10-CM

## 2018-02-21 NOTE — Progress Notes (Signed)
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  She is accompanied by her husband who supplements history.   She reports history of an AVM on the right side of her face, diagnosed many years ago and therefore cannot use blood thinners.  However, prior MRA of the head and neck did not reveal any AVM.    She also reports pain from the right jaw radiating up to the right temple since 2017. That has since resolved.  In 2018, she developed pain around the left eye which would hurt whenever she moved her eye.  It eventually resolved.  She then developed a severe aching pain at her left TMJ which radiates up into her temple.  It is daily.  It lasts a a few seconds and occurs throughout the day.  It may occur spontaneously but also is aggravated by exertion but not talking, chewing or brushing her teeth.  Sed rate from 06/26/17 was 34.  She takes gabapentin 800mg /400mg /800mg  for seizure prevention.  She takes amitriptyline 25mg  at bedtime for burning tongue syndrome.    PAST MEDICAL HISTORY: Past Medical History:  Diagnosis Date  . Acute respiratory failure with hypoxia ([place]) 10/23/2015  . Allergic rhinitis    PT. DENIES  . Anxiety   . Arthritis    NECK  . Ataxia   . Bradycardia    primarily nocturnal  . Burning tongue syndrome 25 years  . Cancer ([place]) dx'd 06/2016   liver  . Cataract   . Cerebellar degeneration   . Chronic pericarditis   . Chronic urinary tract infection   . Complication of anesthesia    low o2 sats, coded 30 years ago  . CVA (cerebral infarction) 05/2003  . Depression   . Encounter for antineoplastic chemotherapy  10/09/2016  . Gait disorder   . Gastric polyps   . GERD (gastroesophageal reflux disease)   . Goals of care, counseling/discussion 10/09/2016  . High cholesterol   . Hyperlipidemia   . Hypotension   . Hypothyroidism   . IBS (irritable bowel syndrome)   . Obesity   . Paroxysmal atrial fibrillation ([name])    chads2vasc score is 6,  she is felt to be a poor candidate for anticoagulation  . Personal history of arterial venous malformation (AVM)    right side of face  . Seizure disorder ([place])   . Seizures ([place]) 2003   " smelling"- Gabapentin "no problem"  . Shortness of breath dyspnea    with exertion  . Sternum fx 10/27/2013  . Stroke [facility]) 5 years ago   Right side of face weak, slurred speach-   . Thyroid disease   . TIA (transient ischemic attack)   . UTI (lower urinary tract infection) 03/27/2016   "frequently"    MEDICATIONS: Current Outpatient Medications on File Prior to Visit  Medication Sig Dispense Refill  . amitriptyline (ELAVIL) 25 MG tablet Take 1 tablet (25 mg total) by mouth at bedtime. 90 tablet 1  . capecitabine (XELODA) 500 MG tablet TAKE 3 TABLETS (1,500 MG TOTAL) BY MOUTH 2 (TWO) TIMES DAILY AFTER A MEAL. (Patient taking differently: Take 1,500 mg by mouth 2 (two) times daily after a meal. TAKE 3 TABLETS (1,500 MG TOTAL) BY  MOUTH 2 (TWO) TIMES DAILY AFTER A MEAL FOR 14 DAYS AS DIRECTED) 84 tablet 2  . escitalopram (LEXAPRO) 20 MG tablet Take 1 tablet (20 mg total) by mouth at bedtime. 90 tablet 1  . fluticasone (FLONASE) 50 MCG/ACT nasal spray Place 2 sprays into both nostrils at bedtime. 16 g 3  . furosemide (LASIX) 20 MG tablet TAKE 1 TABLET DAILY AS NEEDED FOR FLUID RETENTION 90 tablet 0  . gabapentin (NEURONTIN) 400 MG capsule Take 1-2 capsules (400-800 mg total) by mouth 3 (three) times daily. 720 capsule 1  . hydroxypropyl methylcellulose / hypromellose (ISOPTO TEARS / GONIOVISC) 2.5 % ophthalmic solution Place 1 drop into both eyes at bedtime.    Marland Kitchen  levothyroxine (SYNTHROID, LEVOTHROID) 75 MCG tablet Take 1 tablet (75 mcg total) by mouth daily before breakfast. 90 tablet 1  . lovastatin (MEVACOR) 40 MG tablet Take 1 tablet (40 mg total) by mouth at bedtime. 90 tablet 1  . Melatonin 3 MG TBDP Take 3-6 mg by mouth at bedtime as needed. (Patient taking differently: Take 3-6 mg by mouth at bedtime as needed (FOR SLEEP). ) 60 tablet 1  . omeprazole (PRILOSEC) 40 MG capsule Take 1 capsule (40 mg total) by mouth daily. 90 capsule 1  . ondansetron (ZOFRAN) 8 MG tablet Take 1 tablet (8 mg total) by mouth every 8 (eight) hours as needed for nausea or vomiting. 90 tablet 1  . oxyCODONE-acetaminophen (PERCOCET) 5-325 MG tablet Take 1 tablet by mouth every 8 (eight) hours as needed. 30 tablet 0  . temozolomide (TEMODAR) 140 MG capsule TAKE 2 CAPSULES BY MOUTH DAILY. MAY TAKE ON AN EMPTY STOMACH OR AT BEDTIME TO DECREASE NAUSEA AND VOMITING (Patient taking differently: Take 280 mg by mouth daily. TAKE 2 CAPSULES BY MOUTH DAILY. MAY TAKE ON AN EMPTY STOMACH OR AT BEDTIME TO DECREASE NAUSEA AND VOMITING *TO START 5 DAYS PRIOR TO COMPLETION OF 14 DAY COURSE OF XELODA REGIMEN AS DIRECTED*) 10 capsule 2  . XARELTO 20 MG TABS tablet TAKE 1 TABLET DAILY WITH SUPPER 90 tablet 0   No current facility-administered medications on file prior to visit.     ALLERGIES: Allergies  Allergen Reactions  . Lisinopril Cough    FAMILY HISTORY: Family History  Problem Relation Age of Onset  . Heart attack Father 61       fatal  . Coronary artery disease Brother   . Diabetes Brother   . Prostate cancer Brother   . Prostate cancer Son     SOCIAL HISTORY: Social History   Socioeconomic History  . Marital status: Married    Spouse name: Not on file  . Number of children: 6  . Years of education: Not on file  . Highest education level: 10th grade  Occupational History  . Occupation: Agricultural engineer  Social Needs  . Financial resource strain: Not hard at all  . Food  insecurity:    Worry: Never true    Inability: Never true  . Transportation needs:    Medical: No    Non-medical: No  Tobacco Use  . Smoking status: Former Smoker    Packs/day: 0.25    Years: 10.00    Pack years: 2.50    Types: Cigarettes    Last attempt to quit: 07/31/1975    Years since quitting: 42.5  . Smokeless tobacco: Never Used  Substance and Sexual Activity  . Alcohol use: No    Comment: Rare- maybe a drink ever 2 years  . Drug use:  No  . Sexual activity: Not Currently  Lifestyle  . Physical activity:    Days per week: 0 days    Minutes per session: 0 min  . Stress: Not at all  Relationships  . Social connections:    Talks on phone: More than three times a week    Gets together: More than three times a week    Attends religious service: Never    Active member of club or organization: No    Attends meetings of clubs or organizations: Never    Relationship status: Married  . Intimate partner violence:    Fear of current or ex partner: No    Emotionally abused: No    Physically abused: No    Forced sexual activity: No  Other Topics Concern  . Not on file  Social History Narrative   Lives with husband, does have stairs, does not use them. Pt completed 10th grade.    REVIEW OF SYSTEMS: Constitutional: No fevers, chills, or sweats, no generalized fatigue, change in appetite Eyes: No visual changes, double vision, eye pain Ear, nose and throat: No hearing loss, ear pain, nasal congestion, sore throat Cardiovascular: No chest pain, palpitations Respiratory:  No shortness of breath at rest or with exertion, wheezes GastrointestinaI: No nausea, vomiting, diarrhea, abdominal pain, fecal incontinence Genitourinary:  No dysuria, urinary retention or frequency Musculoskeletal:  No neck pain, back pain Integumentary: No rash, pruritus, skin lesions Neurological: as above Psychiatric: No depression, insomnia, anxiety Endocrine: No palpitations, fatigue, diaphoresis,  mood swings, change in appetite, change in weight, increased thirst Hematologic/Lymphatic:  No purpura, petechiae. Allergic/Immunologic: no itchy/runny eyes, nasal congestion, recent allergic reactions, rashes  PHYSICAL EXAM: Vitals:   02/21/18 1505  BP: (!) 104/56  Pulse: 72  SpO2: 94%   General: No acute distress.   Head:  Normocephalic/atraumatic.  Tenderness to palpation of the left TMJ.  Crepitus with jaw opening. Eyes:  Fundi examined but not visualized Neck: supple, no paraspinal tenderness, full range of motion Heart:  Regular rate and rhythm Lungs:  Clear to auscultation bilaterally Back: No paraspinal tenderness Neurological Exam: alert and oriented to person, place, and time. Attention span and concentration intact, recent and remote memory intact, fund of knowledge intact.  Speech fluent and not dysarthric, language intact.  Saccadic eye movements when tracking in all directions, nystagmus.  Otherwise CN II-XII intact. Bulk and tone normal, muscle strength 5/5 throughout.  Sensation to light touch intact.  Deep tendon reflexes 3+ throughout.  Finger to nose intact.  Unable to ambulate.  IMPRESSION: 1.  Left temporal/jaw pain. Consider left TMJ dysfunction 2.  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  Temporal epilepsy/psychomotor seizures/simple partial seizures  PLAN: 1.  Refer to periodontist for evaluation of TMJ dysfunction. 2.  Continue gabapentin 800mg /400mg /800mg  3.  Follow up in December as already scheduled or sooner if needed.  25 minutes spent face to face with patient, over 50% spent discussing management.  Metta Clines, DO  CC:  Dr. Evette Doffing

## 2018-02-21 NOTE — Patient Instructions (Signed)
I would like you evaluated by a periodontist or oral surgeon for evaluation of temporomandibular joint dysfunction Follow up afterwards

## 2018-02-24 ENCOUNTER — Other Ambulatory Visit: Payer: Self-pay | Admitting: Internal Medicine

## 2018-02-24 DIAGNOSIS — I2699 Other pulmonary embolism without acute cor pulmonale: Secondary | ICD-10-CM

## 2018-02-28 ENCOUNTER — Other Ambulatory Visit: Payer: Self-pay | Admitting: Pediatrics

## 2018-02-28 DIAGNOSIS — E038 Other specified hypothyroidism: Secondary | ICD-10-CM

## 2018-03-04 MED FILL — CAPECITABINE 500 MG TABS: 500 | 14 days supply | Qty: 84 | Fill #0

## 2018-03-04 MED FILL — TEMOZOLOMIDE 140 MG CAPSULE: 140 | 5 days supply | Qty: 10 | Fill #0

## 2018-03-07 ENCOUNTER — Telehealth: Payer: Self-pay | Admitting: Pediatrics

## 2018-03-07 DIAGNOSIS — R609 Edema, unspecified: Secondary | ICD-10-CM

## 2018-03-07 DIAGNOSIS — F329 Major depressive disorder, single episode, unspecified: Secondary | ICD-10-CM

## 2018-03-07 DIAGNOSIS — E785 Hyperlipidemia, unspecified: Secondary | ICD-10-CM

## 2018-03-07 DIAGNOSIS — F32A Depression, unspecified: Secondary | ICD-10-CM

## 2018-03-07 DIAGNOSIS — K219 Gastro-esophageal reflux disease without esophagitis: Secondary | ICD-10-CM

## 2018-03-07 DIAGNOSIS — F411 Generalized anxiety disorder: Secondary | ICD-10-CM

## 2018-03-07 MED ORDER — GABAPENTIN 400 MG PO CAPS: 400.0000 mg | ORAL_CAPSULE | Freq: Three times a day (TID) | ORAL | 1 refills | Status: DC

## 2018-03-07 MED ORDER — FUROSEMIDE 20 MG PO TABS
ORAL_TABLET | ORAL | 0 refills | Status: DC
Start: 2018-03-07 — End: 2018-08-11

## 2018-03-07 MED ORDER — AMITRIPTYLINE HCL 25 MG PO TABS: 25.0000 mg | ORAL_TABLET | Freq: Every day | ORAL | 1 refills | Status: DC

## 2018-03-07 MED ORDER — LOVASTATIN 40 MG PO TABS
40.0000 mg | ORAL_TABLET | Freq: Every day | ORAL | 1 refills | Status: DC
Start: 2018-03-07 — End: 2018-10-10

## 2018-03-07 MED ORDER — OMEPRAZOLE 40 MG PO CPDR: 40.0000 mg | DELAYED_RELEASE_CAPSULE | Freq: Every day | ORAL | 1 refills | Status: DC

## 2018-03-07 MED ORDER — ESCITALOPRAM OXALATE 20 MG PO TABS: 20.0000 mg | ORAL_TABLET | Freq: Every day | ORAL | 1 refills | Status: DC

## 2018-03-07 NOTE — Telephone Encounter (Signed)
[facility] and left detailed message advising pt rx sent over and to call back with any questions or concerns.

## 2018-03-10 ENCOUNTER — Ambulatory Visit (INDEPENDENT_AMBULATORY_CARE_PROVIDER_SITE_OTHER): Payer: Medicare Other | Admitting: *Deleted

## 2018-03-10 DIAGNOSIS — R002 Palpitations: Secondary | ICD-10-CM

## 2018-03-10 NOTE — Progress Notes (Signed)
Carelink Summary Report / Loop Recorder 

## 2018-03-11 ENCOUNTER — Inpatient Hospital Stay: Payer: Medicare Other

## 2018-03-11 ENCOUNTER — Inpatient Hospital Stay: Payer: Medicare Other | Attending: Internal Medicine | Admitting: Internal Medicine

## 2018-03-11 ENCOUNTER — Telehealth: Payer: Self-pay | Admitting: Internal Medicine

## 2018-03-11 ENCOUNTER — Encounter: Payer: Self-pay | Admitting: Internal Medicine

## 2018-03-11 VITALS — BP 122/63 | HR 75 | Temp 98.6°F | Resp 18 | Ht 64.0 in | Wt 232.1 lb

## 2018-03-11 DIAGNOSIS — Z8673 Personal history of transient ischemic attack (TIA), and cerebral infarction without residual deficits: Secondary | ICD-10-CM | POA: Diagnosis not present

## 2018-03-11 DIAGNOSIS — C7A8 Other malignant neuroendocrine tumors: Secondary | ICD-10-CM

## 2018-03-11 DIAGNOSIS — C7B8 Other secondary neuroendocrine tumors: Principal | ICD-10-CM

## 2018-03-11 DIAGNOSIS — D500 Iron deficiency anemia secondary to blood loss (chronic): Secondary | ICD-10-CM

## 2018-03-11 DIAGNOSIS — F419 Anxiety disorder, unspecified: Secondary | ICD-10-CM | POA: Insufficient documentation

## 2018-03-11 DIAGNOSIS — I480 Paroxysmal atrial fibrillation: Secondary | ICD-10-CM | POA: Diagnosis not present

## 2018-03-11 DIAGNOSIS — K219 Gastro-esophageal reflux disease without esophagitis: Secondary | ICD-10-CM | POA: Insufficient documentation

## 2018-03-11 DIAGNOSIS — F329 Major depressive disorder, single episode, unspecified: Secondary | ICD-10-CM | POA: Diagnosis not present

## 2018-03-11 DIAGNOSIS — Z923 Personal history of irradiation: Secondary | ICD-10-CM | POA: Insufficient documentation

## 2018-03-11 DIAGNOSIS — K589 Irritable bowel syndrome without diarrhea: Secondary | ICD-10-CM | POA: Insufficient documentation

## 2018-03-11 DIAGNOSIS — Z5111 Encounter for antineoplastic chemotherapy: Secondary | ICD-10-CM

## 2018-03-11 DIAGNOSIS — E039 Hypothyroidism, unspecified: Secondary | ICD-10-CM | POA: Insufficient documentation

## 2018-03-11 DIAGNOSIS — I10 Essential (primary) hypertension: Secondary | ICD-10-CM

## 2018-03-11 DIAGNOSIS — Z7901 Long term (current) use of anticoagulants: Secondary | ICD-10-CM | POA: Insufficient documentation

## 2018-03-11 DIAGNOSIS — E7800 Pure hypercholesterolemia, unspecified: Secondary | ICD-10-CM | POA: Insufficient documentation

## 2018-03-11 DIAGNOSIS — Z79899 Other long term (current) drug therapy: Secondary | ICD-10-CM | POA: Diagnosis not present

## 2018-03-11 DIAGNOSIS — C787 Secondary malignant neoplasm of liver and intrahepatic bile duct: Secondary | ICD-10-CM

## 2018-03-11 DIAGNOSIS — E669 Obesity, unspecified: Secondary | ICD-10-CM | POA: Diagnosis not present

## 2018-03-11 DIAGNOSIS — M1990 Unspecified osteoarthritis, unspecified site: Secondary | ICD-10-CM | POA: Diagnosis not present

## 2018-03-11 DIAGNOSIS — Z86711 Personal history of pulmonary embolism: Secondary | ICD-10-CM | POA: Insufficient documentation

## 2018-03-11 DIAGNOSIS — R55 Syncope and collapse: Secondary | ICD-10-CM

## 2018-03-11 LAB — CMP (CANCER CENTER ONLY)
ALBUMIN: 3.4 g/dL — AB (ref 3.5–5.0)
ALT: 18 U/L (ref 0–44)
ANION GAP: 11 (ref 5–15)
AST: 23 U/L (ref 15–41)
Alkaline Phosphatase: 84 U/L (ref 38–126)
BILIRUBIN TOTAL: 0.5 mg/dL (ref 0.3–1.2)
BUN: 12 mg/dL (ref 8–23)
CO2: 23 mmol/L (ref 22–32)
Calcium: 9.5 mg/dL (ref 8.9–10.3)
Chloride: 102 mmol/L (ref 98–111)
Creatinine: 0.9 mg/dL (ref 0.44–1.00)
GFR, Est AFR Am: >60 mL/min (ref >60)
GFR, Estimated: 58 mL/min — ABNORMAL LOW (ref >60)
GLUCOSE: 99 mg/dL (ref 70–99)
POTASSIUM: 4.7 mmol/L (ref 3.5–5.1)
SODIUM: 136 mmol/L (ref 135–145)
Total Protein: 6.5 g/dL (ref 6.5–8.1)

## 2018-03-11 LAB — CBC WITH DIFFERENTIAL (CANCER CENTER ONLY)
Basophils Absolute: 0 10*3/uL (ref 0.0–0.1)
Basophils Relative: 0 %
Eosinophils Absolute: 0.2 10*3/uL (ref 0.0–0.5)
Eosinophils Relative: 5 %
HEMATOCRIT: 36.5 % (ref 34.8–46.6)
Hemoglobin: 12 g/dL (ref 11.6–15.9)
LYMPHS PCT: 31 %
Lymphs Abs: 1 10*3/uL (ref 0.9–3.3)
MCH: 34.3 pg — ABNORMAL HIGH (ref 25.1–34.0)
MCHC: 32.9 g/dL (ref 31.5–36.0)
MCV: 104.3 fL — AB (ref 79.5–101.0)
MONO ABS: 0.4 10*3/uL (ref 0.1–0.9)
Monocytes Relative: 14 %
NEUTROS ABS: 1.6 10*3/uL (ref 1.5–6.5)
Neutrophils Relative %: 50 %
Platelet Count: 89 10*3/uL — ABNORMAL LOW (ref 145–400)
RBC: 3.5 MIL/uL — AB (ref 3.70–5.45)
RDW: 17 % — AB (ref 11.2–14.5)
WBC Count: 3.2 10*3/uL — ABNORMAL LOW (ref 3.9–10.3)

## 2018-03-11 NOTE — Progress Notes (Signed)
[facility] [phone]   [phone]  OFFICE PROGRESS NOTE  [name], [place] [id number]  DIAGNOSIS:  1) Metastatic intermediate. Neuroendocrine tumor of lung primary diagnosed in January 2018 and presented with small bilateral pulmonary nodules in addition to multiple liver metastasis. 2) right lower lobe pulmonary embolism diagnosed incidentally on CT scan of the chest on 04/23/2017  PRIOR THERAPY:  1) Status post radio embolization with Y 90 to the liver lesions by interventional radiology. 2) status post IVC filter placement by interventional radiology on 04/24/2017  CURRENT THERAPY: Xeloda 750 MG/M2 twice a day days 1-14 and Temodar 150 MG/M2 days 10-14 every 4 weeks. Status post 18 cycles. She will start cycle #21 on March 10, 2018.  INTERVAL HISTORY: [name] 84 y.o. female returns to the clinic today for follow-up visit accompanied by her husband.  The patient complaining of increasing fatigue and weakness as well as dizzy spells recently.  She also mentioned that she had black stool with diarrhea recently.  She denied having any blood in her stool.  She denied having any current chest pain but has shortness breath with exertion with no cough or hemoptysis.  She has no fever or chills.  She has been tolerating her treatment with Xeloda and Temodar fairly well.  She is here for evaluation and repeat blood work.  MEDICAL HISTORY: Past Medical History:  Diagnosis Date  . Acute respiratory failure with hypoxia ([place]) 10/23/2015  . Allergic rhinitis    PT. DENIES  . Anxiety   . Arthritis    NECK  . Ataxia   . Bradycardia    primarily nocturnal  . Burning tongue syndrome 25 years  . Cancer ([place]) dx'd 06/2016   liver  . Cataract   . Cerebellar degeneration   . Chronic pericarditis   . Chronic urinary tract infection   . Complication of anesthesia    low o2 sats, coded 30 years ago  . CVA (cerebral  infarction) 05/2003  . Depression   . Encounter for antineoplastic chemotherapy 10/09/2016  . Gait disorder   . Gastric polyps   . GERD (gastroesophageal reflux disease)   . Goals of care, counseling/discussion 10/09/2016  . High cholesterol   . Hyperlipidemia   . Hypotension   . Hypothyroidism   . IBS (irritable bowel syndrome)   . Obesity   . Paroxysmal atrial fibrillation ([name])    chads2vasc score is 6,  she is felt to be a poor candidate for anticoagulation  . Personal history of arterial venous malformation (AVM)    right side of face  . Seizure disorder ([place])   . Seizures ([place]) 2003   " smelling"- Gabapentin "no problem"  . Shortness of breath dyspnea    with exertion  . Sternum fx 10/27/2013  . Stroke [facility]) 5 years ago   Right side of face weak, slurred speach-   . Thyroid disease   . TIA (transient ischemic attack)   . UTI (lower urinary tract infection) 03/27/2016   "frequently"    ALLERGIES:  is allergic to lisinopril.  MEDICATIONS:  Current Outpatient Medications  Medication Sig Dispense Refill  . amitriptyline (ELAVIL) 25 MG tablet Take 1 tablet (25 mg total) by mouth at bedtime. 90 tablet 1  . capecitabine (XELODA) 500 MG tablet TAKE 3 TABLETS (1,500 MG TOTAL) BY MOUTH 2 (TWO) TIMES DAILY AFTER A MEAL. (Patient taking differently: Take 1,500 mg by mouth 2 (  two) times daily after a meal. TAKE 3 TABLETS (1,500 MG TOTAL) BY MOUTH 2 (TWO) TIMES DAILY AFTER A MEAL FOR 14 DAYS AS DIRECTED) 84 tablet 2  . escitalopram (LEXAPRO) 20 MG tablet Take 1 tablet (20 mg total) by mouth at bedtime. 90 tablet 1  . fluticasone (FLONASE) 50 MCG/ACT nasal spray Place 2 sprays into both nostrils at bedtime. 16 g 3  . furosemide (LASIX) 20 MG tablet TAKE 1 TABLET DAILY AS NEEDED FOR FLUID RETENTION 90 tablet 0  . gabapentin (NEURONTIN) 400 MG capsule Take 1-2 capsules (400-800 mg total) by mouth 3 (three) times daily. 720 capsule 1  . hydroxypropyl methylcellulose / hypromellose (ISOPTO  TEARS / GONIOVISC) 2.5 % ophthalmic solution Place 1 drop into both eyes at bedtime.    Marland Kitchen levothyroxine (SYNTHROID, LEVOTHROID) 75 MCG tablet TAKE 1 TABLET DAILY BEFORE BREAKFAST 90 tablet 1  . lovastatin (MEVACOR) 40 MG tablet Take 1 tablet (40 mg total) by mouth at bedtime. 90 tablet 1  . Melatonin 3 MG TBDP Take 3-6 mg by mouth at bedtime as needed. (Patient taking differently: Take 3-6 mg by mouth at bedtime as needed (FOR SLEEP). ) 60 tablet 1  . omeprazole (PRILOSEC) 40 MG capsule Take 1 capsule (40 mg total) by mouth daily. 90 capsule 1  . ondansetron (ZOFRAN) 8 MG tablet Take 1 tablet (8 mg total) by mouth every 8 (eight) hours as needed for nausea or vomiting. 90 tablet 1  . oxyCODONE-acetaminophen (PERCOCET) 5-325 MG tablet Take 1 tablet by mouth every 8 (eight) hours as needed. 30 tablet 0  . temozolomide (TEMODAR) 140 MG capsule TAKE 2 CAPSULES BY MOUTH DAILY. MAY TAKE ON AN EMPTY STOMACH OR AT BEDTIME TO DECREASE NAUSEA AND VOMITING (Patient taking differently: Take 280 mg by mouth daily. TAKE 2 CAPSULES BY MOUTH DAILY. MAY TAKE ON AN EMPTY STOMACH OR AT BEDTIME TO DECREASE NAUSEA AND VOMITING *TO START 5 DAYS PRIOR TO COMPLETION OF 14 DAY COURSE OF XELODA REGIMEN AS DIRECTED*) 10 capsule 2  . XARELTO 20 MG TABS tablet TAKE 1 TABLET DAILY WITH SUPPER 90 tablet 0   No current facility-administered medications for this visit.     SURGICAL HISTORY:  Past Surgical History:  Procedure Laterality Date  . APPENDECTOMY  84 years old  . COLONOSCOPY  2006, 2009  . COLONOSCOPY WITH PROPOFOL N/A 03/28/2016   Procedure: COLONOSCOPY WITH PROPOFOL;  Surgeon: Gatha Mayer, MD;  Location: [facility];  Service: Endoscopy;  Laterality: N/A;  . cyst removed  35 years ago  . EP IMPLANTABLE DEVICE N/A 01/06/2015   Procedure: Loop Recorder Insertion;  Surgeon: Thompson Grayer, MD;  Location: [facility];  Service: Cardiovascular;  Laterality: N/A;  . EYE SURGERY Right    Cataract  . IR ANGIOGRAM  SELECTIVE EACH ADDITIONAL VESSEL  11/28/2016  . IR ANGIOGRAM SELECTIVE EACH ADDITIONAL VESSEL  11/28/2016  . IR ANGIOGRAM SELECTIVE EACH ADDITIONAL VESSEL  11/28/2016  . IR ANGIOGRAM SELECTIVE EACH ADDITIONAL VESSEL  11/28/2016  . IR ANGIOGRAM SELECTIVE EACH ADDITIONAL VESSEL  12/13/2016  . IR ANGIOGRAM SELECTIVE EACH ADDITIONAL VESSEL  12/13/2016  . IR ANGIOGRAM SELECTIVE EACH ADDITIONAL VESSEL  01/09/2017  . IR ANGIOGRAM VISCERAL SELECTIVE  11/28/2016  . IR ANGIOGRAM VISCERAL SELECTIVE  11/28/2016  . IR ANGIOGRAM VISCERAL SELECTIVE  12/13/2016  . IR ANGIOGRAM VISCERAL SELECTIVE  12/13/2016  . IR ANGIOGRAM VISCERAL SELECTIVE  01/09/2017  . IR EMBO ARTERIAL NOT HEMORR HEMANG INC GUIDE ROADMAPPING  11/28/2016  .  IR EMBO TUMOR ORGAN ISCHEMIA INFARCT INC GUIDE ROADMAPPING  12/13/2016  . IR EMBO TUMOR ORGAN ISCHEMIA INFARCT INC GUIDE ROADMAPPING  01/09/2017  . IR IVC FILTER PLMT / S&I /IMG GUID/MOD SED  04/24/2017  . IR RADIOLOGIST EVAL & MGMT  11/06/2016  . IR RADIOLOGIST EVAL & MGMT  01/02/2017  . IR RADIOLOGIST EVAL & MGMT  02/05/2017  . IR RADIOLOGIST EVAL & MGMT  05/02/2017  . IR RADIOLOGIST EVAL & MGMT  10/17/2017  . IR US GUIDE VASC ACCESS RIGHT  11/28/2016  . IR US GUIDE VASC ACCESS RIGHT  12/13/2016  . IR US GUIDE VASC ACCESS RIGHT  01/09/2017  . KNEE ARTHROSCOPY Right 11/14/2006  . KNEE ARTHROSCOPY Bilateral 5 and 6 years ago  . KNEE ARTHROSCOPY WITH LATERAL MENISECTOMY  07/03/2012   Procedure: KNEE ARTHROSCOPY WITH LATERAL MENISECTOMY;  Surgeon: Magnus Sinning, MD;  Location: [facility];  Service: Orthopedics;  Laterality: Left;  with Partial Lateral Menisectomy and Medial Menisectomy. Shaving of medial and lateral femoral condyles. Shaving of patella. Removal of a loose body  . tibial and fibular internal fixation Left   . TOTAL ABDOMINAL HYSTERECTOMY  84 years old  . UPPER GASTROINTESTINAL ENDOSCOPY  2009, 2013    REVIEW OF SYSTEMS:  A comprehensive review of systems was negative except for: Constitutional:  positive for fatigue Respiratory: positive for dyspnea on exertion Gastrointestinal: positive for diarrhea and melena Neurological: positive for dizziness   PHYSICAL EXAMINATION: General appearance: alert, cooperative, fatigued and no distress Head: Normocephalic, without obvious abnormality, atraumatic Neck: no adenopathy, no JVD, supple, symmetrical, trachea midline and thyroid not enlarged, symmetric, no tenderness/mass/nodules Lymph nodes: Cervical, supraclavicular, and axillary nodes normal. Resp: clear to auscultation bilaterally Back: symmetric, no curvature. ROM normal. No CVA tenderness. Cardio: regular rate and rhythm, S1, S2 normal, no murmur, click, rub or gallop GI: soft, non-tender; bowel sounds normal; no masses,  no organomegaly Extremities: extremities normal, atraumatic, no cyanosis or edema  ECOG PERFORMANCE STATUS: 1 - Symptomatic but completely ambulatory  Blood pressure 122/63, pulse 75, temperature 98.6 F (37 C), temperature source Oral, resp. rate 18, height 5\' 4"  (1.626 m), weight 232 lb 1.6 oz (105.3 kg), SpO2 97 %.  LABORATORY DATA: Lab Results  Component Value Date   WBC 3.2 (L) 03/11/2018   HGB 12.0 03/11/2018   HCT 36.5 03/11/2018   MCV 104.3 (H) 03/11/2018   PLT 89 (L) 03/11/2018      Chemistry      Component Value Date/Time   NA 136 03/11/2018 1444   NA 137 08/22/2017 1653   NA 135 (L) 07/10/2017 1142   K 4.7 03/11/2018 1444   K 4.6 07/10/2017 1142   CL 102 03/11/2018 1444   CO2 23 03/11/2018 1444   CO2 25 07/10/2017 1142   BUN 12 03/11/2018 1444   BUN 13 08/22/2017 1653   BUN 11.4 07/10/2017 1142   CREATININE 0.90 03/11/2018 1444   CREATININE 1.0 07/10/2017 1142      Component Value Date/Time   CALCIUM 9.5 03/11/2018 1444   CALCIUM 9.9 07/10/2017 1142   ALKPHOS 84 03/11/2018 1444   ALKPHOS 93 07/10/2017 1142   AST 23 03/11/2018 1444   AST 43 (H) 07/10/2017 1142   ALT 18 03/11/2018 1444   ALT 28 07/10/2017 1142   BILITOT 0.5  03/11/2018 1444   BILITOT 0.56 07/10/2017 1142       RADIOGRAPHIC STUDIES: No results found.  ASSESSMENT AND PLAN:  This is a very pleasant 84  years old white female with metastatic intermediate grade neuroendocrine carcinoma of questionable lung primary and multiple metastatic liver lesions and pancreatic lesions.She status post treatment with radio embolization with Y90 to the left and right lobe liver lesions.Status post treatment with Y 90 to the left and right lobes of the liver. She is currently undergoing systemic chemotherapy with Xeloda and Temodar status post 20 cycles. The patient continues to complain of increasing fatigue and weakness as well as dizzy spells.  She also has one episode of diarrhea with black stool.  She has drop in her hemoglobin and hematocrit compared to few months ago but she has no significant anemia. I recommended for the patient to have stool for Hemoccult performed in the next few days.  If she has any evidence for gastrointestinal bleed, I will refer her to gastroenterology for evaluation. I also encouraged the patient to increase her oral intake. She will continue with her current treatment with Xeloda and Temodar. I will see her back for follow-up visit in 1 months for evaluation after repeating CT scan of the chest, abdomen and pelvis for restaging of her disease. She was advised to call immediately if she has any concerning symptoms in the interval. The patient voices understanding of current disease status and treatment options and is in agreement with the current care plan. All questions were answered. The patient knows to call the clinic with any problems, questions or concerns. We can certainly see the patient much sooner if necessary.  Disclaimer: This note was dictated with voice recognition software. Similar sounding words can inadvertently be transcribed and may not be corrected upon review.

## 2018-03-11 NOTE — Telephone Encounter (Signed)
Scheduled appt per 8/13 los - gave patient AVS and calender per los.

## 2018-03-12 ENCOUNTER — Telehealth: Payer: Self-pay | Admitting: Neurology

## 2018-03-12 ENCOUNTER — Encounter: Payer: Self-pay | Admitting: Family Medicine

## 2018-03-12 ENCOUNTER — Ambulatory Visit (INDEPENDENT_AMBULATORY_CARE_PROVIDER_SITE_OTHER): Payer: Medicare Other | Admitting: Family Medicine

## 2018-03-12 VITALS — BP 136/78 | HR 74 | Temp 97.3°F | Ht 64.0 in

## 2018-03-12 DIAGNOSIS — G8929 Other chronic pain: Secondary | ICD-10-CM | POA: Diagnosis not present

## 2018-03-12 DIAGNOSIS — R6884 Jaw pain: Secondary | ICD-10-CM | POA: Diagnosis not present

## 2018-03-12 MED ORDER — OXYCODONE-ACETAMINOPHEN 5-325 MG PO TABS: 1.0000 | ORAL_TABLET | Freq: Three times a day (TID) | ORAL | 0 refills | Status: DC | PRN

## 2018-03-12 NOTE — Progress Notes (Signed)
Subjective: CC: facial pain PCP: Eustaquio Maize, MD BOF:[id number] [name] is a 84 y.o. female presenting to clinic today for:  1. Facial pain Patient was seen by her oncologist yesterday for a neuroendocrine cancer for which she has been treated with Xeloda and Temodar.  There was concern for possible GI bleed with her report of 1 black stool.  She was sent home with FOBT to return to their clinic.  During that visit, she had mentioned that she was having dizzy spells, fatigue and sensations of weakness.  She had denied fever or chills.  She had blood labs performed yesterday which were fairly unremarkable except for decreased albumin 3.4 and leukopenia to 3.2.  Hemoglobin was stable.  Last MRI brain was from 2017 which showed at that time a 4 mm acute infarction on the right and extensive chronic small vessel ischemic changes throughout the brain with numerous old cerebellar infarctions.  There is also a 9 mm meningioma on the right noted.  Today she follows up and notes that the long standing left-sided facial pain seems to be worsening.  She describes it now as a constant pain that is severe.  She has had difficulty with eye pain on the left, particularly with looking to the left.  Denies visual disturbance or loss of vision.  She denies worsening jaw pain with eating or chewing.  She denies gritting her teeth or clenching her jaw.  She does not use dentures.  She was referred to the orthodontist but states that she has had difficulty with her insurance approving her referral.  She attempted to contact her neurologist today but has yet to receive a call for instructions.  She notes that symptoms are not relieved by ice, Tylenol or heat.  She sees [facility] in [place] who were unable to explain why she was having pain in her eye.   ROS: Per HPI  Allergies  Allergen Reactions  . Lisinopril Cough   Past Medical History:  Diagnosis Date  . Acute respiratory failure with  hypoxia ([place]) 10/23/2015  . Allergic rhinitis    PT. DENIES  . Anxiety   . Arthritis    NECK  . Ataxia   . Bradycardia    primarily nocturnal  . Burning tongue syndrome 25 years  . Cancer ([place]) dx'd 06/2016   liver  . Cataract   . Cerebellar degeneration   . Chronic pericarditis   . Chronic urinary tract infection   . Complication of anesthesia    low o2 sats, coded 30 years ago  . CVA (cerebral infarction) 05/2003  . Depression   . Encounter for antineoplastic chemotherapy 10/09/2016  . Gait disorder   . Gastric polyps   . GERD (gastroesophageal reflux disease)   . Goals of care, counseling/discussion 10/09/2016  . High cholesterol   . Hyperlipidemia   . Hypotension   . Hypothyroidism   . IBS (irritable bowel syndrome)   . Obesity   . Paroxysmal atrial fibrillation ([name])    chads2vasc score is 6,  she is felt to be a poor candidate for anticoagulation  . Personal history of arterial venous malformation (AVM)    right side of face  . Seizure disorder ([place])   . Seizures ([place]) 2003   " smelling"- Gabapentin "no problem"  . Shortness of breath dyspnea    with exertion  . Sternum fx 10/27/2013  . Stroke [facility]) 5 years ago   Right side of face weak, slurred speach-   .  Thyroid disease   . TIA (transient ischemic attack)   . UTI (lower urinary tract infection) 03/27/2016   "frequently"    Current Outpatient Medications:  .  amitriptyline (ELAVIL) 25 MG tablet, Take 1 tablet (25 mg total) by mouth at bedtime., Disp: 90 tablet, Rfl: 1 .  capecitabine (XELODA) 500 MG tablet, TAKE 3 TABLETS (1,500 MG TOTAL) BY MOUTH 2 (TWO) TIMES DAILY AFTER A MEAL. (Patient taking differently: Take 1,500 mg by mouth 2 (two) times daily after a meal. TAKE 3 TABLETS (1,500 MG TOTAL) BY MOUTH 2 (TWO) TIMES DAILY AFTER A MEAL FOR 14 DAYS AS DIRECTED), Disp: 84 tablet, Rfl: 2 .  escitalopram (LEXAPRO) 20 MG tablet, Take 1 tablet (20 mg total) by mouth at bedtime., Disp: 90 tablet, Rfl: 1 .   fluticasone (FLONASE) 50 MCG/ACT nasal spray, Place 2 sprays into both nostrils at bedtime., Disp: 16 g, Rfl: 3 .  furosemide (LASIX) 20 MG tablet, TAKE 1 TABLET DAILY AS NEEDED FOR FLUID RETENTION, Disp: 90 tablet, Rfl: 0 .  gabapentin (NEURONTIN) 400 MG capsule, Take 1-2 capsules (400-800 mg total) by mouth 3 (three) times daily., Disp: 720 capsule, Rfl: 1 .  hydroxypropyl methylcellulose / hypromellose (ISOPTO TEARS / GONIOVISC) 2.5 % ophthalmic solution, Place 1 drop into both eyes at bedtime., Disp: , Rfl:  .  levothyroxine (SYNTHROID, LEVOTHROID) 75 MCG tablet, TAKE 1 TABLET DAILY BEFORE BREAKFAST, Disp: 90 tablet, Rfl: 1 .  lovastatin (MEVACOR) 40 MG tablet, Take 1 tablet (40 mg total) by mouth at bedtime., Disp: 90 tablet, Rfl: 1 .  Melatonin 3 MG TBDP, Take 3-6 mg by mouth at bedtime as needed. (Patient taking differently: Take 3-6 mg by mouth at bedtime as needed (FOR SLEEP). ), Disp: 60 tablet, Rfl: 1 .  omeprazole (PRILOSEC) 40 MG capsule, Take 1 capsule (40 mg total) by mouth daily., Disp: 90 capsule, Rfl: 1 .  ondansetron (ZOFRAN) 8 MG tablet, Take 1 tablet (8 mg total) by mouth every 8 (eight) hours as needed for nausea or vomiting., Disp: 90 tablet, Rfl: 1 .  oxyCODONE-acetaminophen (PERCOCET) 5-325 MG tablet, Take 1 tablet by mouth every 8 (eight) hours as needed. (Patient not taking: Reported on 03/11/2018), Disp: 30 tablet, Rfl: 0 .  temozolomide (TEMODAR) 140 MG capsule, TAKE 2 CAPSULES BY MOUTH DAILY. MAY TAKE ON AN EMPTY STOMACH OR AT BEDTIME TO DECREASE NAUSEA AND VOMITING (Patient taking differently: Take 280 mg by mouth daily. TAKE 2 CAPSULES BY MOUTH DAILY. MAY TAKE ON AN EMPTY STOMACH OR AT BEDTIME TO DECREASE NAUSEA AND VOMITING *TO START 5 DAYS PRIOR TO COMPLETION OF 14 DAY COURSE OF XELODA REGIMEN AS DIRECTED*), Disp: 10 capsule, Rfl: 2 .  XARELTO 20 MG TABS tablet, TAKE 1 TABLET DAILY WITH SUPPER, Disp: 90 tablet, Rfl: 0 Social History   Socioeconomic History  . Marital  status: Married    Spouse name: Not on file  . Number of children: 6  . Years of education: Not on file  . Highest education level: 10th grade  Occupational History  . Occupation: Agricultural engineer  Social Needs  . Financial resource strain: Not hard at all  . Food insecurity:    Worry: Never true    Inability: Never true  . Transportation needs:    Medical: No    Non-medical: No  Tobacco Use  . Smoking status: Former Smoker    Packs/day: 0.25    Years: 10.00    Pack years: 2.50    Types: Cigarettes  Last attempt to quit: 07/31/1975    Years since quitting: 42.6  . Smokeless tobacco: Never Used  Substance and Sexual Activity  . Alcohol use: No    Comment: Rare- maybe a drink ever 2 years  . Drug use: No  . Sexual activity: Not Currently  Lifestyle  . Physical activity:    Days per week: 0 days    Minutes per session: 0 min  . Stress: Not at all  Relationships  . Social connections:    Talks on phone: More than three times a week    Gets together: More than three times a week    Attends religious service: Never    Active member of club or organization: No    Attends meetings of clubs or organizations: Never    Relationship status: Married  . Intimate partner violence:    Fear of current or ex partner: No    Emotionally abused: No    Physically abused: No    Forced sexual activity: No  Other Topics Concern  . Not on file  Social History Narrative   Lives with husband, does have stairs, does not use them. Pt completed 10th grade.   Family History  Problem Relation Age of Onset  . Heart attack Father 77       fatal  . Coronary artery disease Brother   . Diabetes Brother   . Prostate cancer Brother   . Prostate cancer Son     Objective: Office vital signs reviewed. BP 136/78   Pulse 74   Temp (!) 97.3 F (36.3 C) (Oral)   Ht _0  (1.626 m)   BMI 39.84 kg/m   Physical Examination:  General: Awake, alert, No acute distress HEENT: Normal    Ears: Tympanic  membranes intact, normal light reflex, no erythema, no bulging    Eyes: Extraocular membranes intact    Throat: moist mucus membranes, no erythema, poor dentition with multiple teeth missing. Jaw: She has exquisite tenderness to palpation along the TMJ, mandible and temporal area on the left.  There is a palpable crepitus with opening and closing of her jaw.  She is able to do this fully.  No gross swelling, discoloration or palpable bony abnormalities.  Assessment/ Plan: 84 y.o. female   1. Chronic jaw pain I agree that this is likely TMJ given physical exam.  However, GCA is amongst the differential diagnosis.  I will obtain a CRP and ESR though I do question whether or not her malignancy may impact these levels.  Will hold off on prednisone pending further assessment.  I discussed her care with her PCP who is in agreement.  A small quantity of Percocet has been sent to the pharmacy since this is relieve pain for her in the past.  I did encourage her to go ahead and see the specialist as recommended by her neurologist.  Follow-up with PCP in 1 week for recheck. - C-reactive protein - Sedimentation Rate - oxyCODONE-acetaminophen (PERCOCET) 5-325 MG tablet; Take 1 tablet by mouth every 8 (eight) hours as needed for severe pain.  Dispense: 10 tablet; Refill: 0   Orders Placed This Encounter  Procedures  . C-reactive protein  . Sedimentation Rate   Meds ordered this encounter  Medications  . DISCONTD: oxyCODONE-acetaminophen (PERCOCET) 5-325 MG tablet    Sig: Take 1 tablet by mouth every 8 (eight) hours as needed for severe pain.    Dispense:  10 tablet    Refill:  0  . oxyCODONE-acetaminophen (  PERCOCET) 5-325 MG tablet    Sig: Take 1 tablet by mouth every 8 (eight) hours as needed for severe pain.    Dispense:  10 tablet    Refill:  0   The Narcotic Database has been reviewed.  There were no red flags.     Rebekah Norlander, DO [place] [phone]

## 2018-03-12 NOTE — Patient Instructions (Signed)
I agree with Dr. Tomi Likens and Dr. Evette Doffing.  This may be TMJ dysfunction.  I want you to go ahead and see the specialist that Dr. Tomi Likens referred you to.  Go ahead and get a mouthguard to see if this might help.  I have given you a small amount of pain medication to see if this might relieve your symptoms.  I am also obtaining a few labs to evaluate you for another condition called temporal arteritis.  You will be contacted with results once these are available.  Follow-up with your PCP in 1 week.  Temporomandibular Joint Syndrome Temporomandibular joint (TMJ) syndrome is a condition that affects the joints between your jaw and your skull. The TMJs are located near your ears and allow your jaw to open and close. These joints and the nearby muscles are involved in all movements of the jaw. People with TMJ syndrome have pain in the area of these joints and muscles. Chewing, biting, or other movements of the jaw can be difficult or painful. TMJ syndrome can be caused by various things. In many cases, the condition is mild and goes away within a few weeks. For some people, the condition can become a long-term problem. What are the causes? Possible causes of TMJ syndrome include:  Grinding your teeth or clenching your jaw. Some people do this when they are under stress.  Arthritis.  Injury to the jaw.  Head or neck injury.  Teeth or dentures that are not aligned well.  In some cases, the cause of TMJ syndrome may not be known. What are the signs or symptoms? The most common symptom is an aching pain on the side of the head in the area of the TMJ. Other symptoms may include:  Pain when moving your jaw, such as when chewing or biting.  Being unable to open your jaw all the way.  Making a clicking sound when you open your mouth.  Headache.  Earache.  Neck or shoulder pain.  How is this diagnosed? Diagnosis can usually be made based on your symptoms, your medical history, and a physical exam.  Your health care provider may check the range of motion of your jaw. Imaging tests, such as X-rays or an MRI, are sometimes done. You may need to see your dentist to determine if your teeth and jaw are lined up correctly. How is this treated? TMJ syndrome often goes away on its own. If treatment is needed, the options may include:  Eating soft foods and applying ice or heat.  Medicines to relieve pain or inflammation.  Medicines to relax the muscles.  A splint, bite plate, or mouthpiece to prevent teeth grinding or jaw clenching.  Relaxation techniques or counseling to help reduce stress.  Transcutaneous electrical nerve stimulation (TENS). This helps to relieve pain by applying an electrical current through the skin.  Acupuncture. This is sometimes helpful to relieve pain.  Jaw surgery. This is rarely needed.  Follow these instructions at home:  Take medicines only as directed by your health care provider.  Eat a soft diet if you are having trouble chewing.  Apply ice to the painful area. ? Put ice in a plastic bag. ? Place a towel between your skin and the bag. ? Leave the ice on for 20 minutes, 2-3 times a day.  Apply a warm compress to the painful area as directed.  Massage your jaw area and perform any jaw stretching exercises as recommended by your health care provider.  If you  were given a mouthpiece or bite plate, wear it as directed.  Avoid foods that require a lot of chewing. Do not chew gum.  Keep all follow-up visits as directed by your health care provider. This is important. Contact a health care provider if:  You are having trouble eating.  You have new or worsening symptoms. Get help right away if:  Your jaw locks open or closed. This information is not intended to replace advice given to you by your health care provider. Make sure you discuss any questions you have with your health care provider. Document Released: 04/10/2001 Document Revised:  03/15/2016 Document Reviewed: 02/18/2014 Elsevier Interactive Patient Education  [employer].

## 2018-03-12 NOTE — Telephone Encounter (Signed)
Patient called stating that she was having a hard time with pain in her face. Dr. Tomi Likens recommended her to another doctor (she couldn't remember the name) but there wasn't an availability anytime soon. She wanted to talk to [employer] or nurse about what she could do with her pain. Please call her at [phone]. Thanks.

## 2018-03-13 LAB — CUP PACEART REMOTE DEVICE CHECK
Date Time Interrogation Session: 20190710014002
MDC IDC PG IMPLANT DT: 20160609

## 2018-03-13 LAB — C-REACTIVE PROTEIN: CRP: 1 mg/L (ref 0–10)

## 2018-03-13 LAB — SEDIMENTATION RATE: Sed Rate: 16 mm/hr (ref 0–40)

## 2018-03-13 NOTE — Telephone Encounter (Signed)
Called and spoke with Pt's husband. Pt was able to be worked in to see a Optometrist yesterday,  the Dr had labs drawn and Pt is to f/u again on Monday. Possible biopsy? Concerned with malignancy. Husband will have the notes forwarded to [place] after Mondays recheck

## 2018-03-18 ENCOUNTER — Inpatient Hospital Stay: Payer: Medicare Other

## 2018-03-18 DIAGNOSIS — Z79899 Other long term (current) drug therapy: Secondary | ICD-10-CM | POA: Diagnosis not present

## 2018-03-18 DIAGNOSIS — C7A8 Other malignant neuroendocrine tumors: Secondary | ICD-10-CM | POA: Diagnosis not present

## 2018-03-18 DIAGNOSIS — Z86711 Personal history of pulmonary embolism: Secondary | ICD-10-CM | POA: Diagnosis not present

## 2018-03-18 DIAGNOSIS — Z7901 Long term (current) use of anticoagulants: Secondary | ICD-10-CM | POA: Diagnosis not present

## 2018-03-18 DIAGNOSIS — Z923 Personal history of irradiation: Secondary | ICD-10-CM | POA: Diagnosis not present

## 2018-03-18 DIAGNOSIS — C7B8 Other secondary neuroendocrine tumors: Secondary | ICD-10-CM | POA: Diagnosis not present

## 2018-03-18 DIAGNOSIS — D500 Iron deficiency anemia secondary to blood loss (chronic): Secondary | ICD-10-CM

## 2018-03-18 LAB — OCCULT BLOOD X 1 CARD TO LAB, STOOL
FECAL OCCULT BLD: NEGATIVE
Fecal Occult Bld: NEGATIVE
Fecal Occult Bld: NEGATIVE

## 2018-03-19 ENCOUNTER — Encounter: Payer: Self-pay | Admitting: Pediatrics

## 2018-03-19 ENCOUNTER — Telehealth: Payer: Self-pay

## 2018-03-19 ENCOUNTER — Ambulatory Visit (INDEPENDENT_AMBULATORY_CARE_PROVIDER_SITE_OTHER): Payer: Medicare Other | Admitting: Pediatrics

## 2018-03-19 VITALS — BP 124/66 | HR 81 | Temp 97.6°F | Ht 64.0 in

## 2018-03-19 DIAGNOSIS — Z79899 Other long term (current) drug therapy: Secondary | ICD-10-CM

## 2018-03-19 DIAGNOSIS — R51 Headache: Secondary | ICD-10-CM | POA: Diagnosis not present

## 2018-03-19 DIAGNOSIS — G119 Hereditary ataxia, unspecified: Secondary | ICD-10-CM

## 2018-03-19 DIAGNOSIS — R519 Headache, unspecified: Secondary | ICD-10-CM

## 2018-03-19 MED ORDER — OXCARBAZEPINE 150 MG PO TABS: 150.0000 mg | ORAL_TABLET | Freq: Two times a day (BID) | ORAL | 3 refills | Status: DC

## 2018-03-19 NOTE — Telephone Encounter (Signed)
Called and spoke with husband [place]. Advised of oxcarbazepine 150 mg BID and to check Na in one month. Pt has appointment 04/01/18 for f/u with [place].

## 2018-03-19 NOTE — Telephone Encounter (Signed)
-----  Message from Pieter Partridge, DO sent at 03/19/2018  4:33 PM EDT ----- [name], If it has been determined that she does not have TMJ dysfunction, then she may well have trigeminal neuralgia.  We can start oxcarbazepine (Trileptal) for the facial pain.  I would start '150mg'$  twice daily.  We can increase dose from there.  We will have to monitor Na levels (as it may cause hyponatremia).  We will contact her to set this up.    Adam ----- Message ----- From: Rebekah Maize, MD Sent: 03/19/2018   2:06 PM EDT To: Pieter Partridge, DO  Hi Dr. Tomi Likens,  I follow [name] at [facility] for primary care. I'm at a loss about what to try next for her L sided facial pain. It is getting worse, was happening for seconds, now lasting for minutes to hours at times. New over last few months. Normal ESR/CRP last week. She saw dentist who took xrays and panoramic and didn't think TMJ contributing.  She reports a R sided facial AVM that gets bigger with leaning forward/laying R side. Imaging that showed AVM was done years ago in other city and she says those records were lost.  Normal angiogram in 2010 [facility].   The skin along L jaw to L mid cheek was quite sensitive/tender to light touch, with no rash today. She had normal brain MRI 2017.   I've thought about repeating imaging, not sure if it would be helpful. Already on gabapentin. Possible it is trigeminal neuralgia? I believe she has an upcoming appt to follow up with you.  Arbie Cookey

## 2018-03-19 NOTE — Progress Notes (Signed)
Subjective:   Patient ID: [name], female    DOB: 1934/07/26, 84 y.o.   MRN: [id number] CC: Facial Pain (Follow up)  HPI: [name] is a 84 y.o. female   Here today with her husband.  Seen 1 week ago for worsening pain in the LEFT side of her face.  CRP ESR drawn, were normal.  Pain is been ongoing for the last few months but has been getting progressively worse and happening for longer.  At first would last just for seconds at a time, coming on multiple times daily.  Over the last week she has had several episodes of the pain starting and lasting for minutes to hours.  No swelling in her face when this occurs.  Nothing seems to help the pain when it comes on.  I sometimes makes it worse, the skin feels very sensitive along her left jawline cheek.  Pressing on it now she can elicit some of the pain. Eating or significant chewing does not affect pain.  She was seen by her dentist yesterday, had x-rays and films done.  He thinks it is unlikely to be coming from her TMJ.  History of AVM on the RIGHT side of her face.  She can feel it start to enlarge anytime there is increased pressure on that side, such as when lying on her right side of her when bending forward.  She says she gets visible swelling in the face within a minute or so, improves over the next few hours once pressure is relieved.  She was seen at [facility] for an evaluation of this in 2010, had a normal angiogram done at that time, records available through care everywhere.  She has had a wheelchair for over 5 years, she has a history of cerebellar ataxia and is reliant on a wheelchair for mobility at home and outside the home.  The wheels are coming apart on her wheelchair.  She asks for a new prescription.  Relevant past medical, surgical, family and social history reviewed. Allergies and medications reviewed and updated. Social History   Tobacco Use  Smoking Status Former Smoker  . Packs/day: 0.25  . Years: 10.00  .  Pack years: 2.50  . Types: Cigarettes  . Last attempt to quit: 07/31/1975  . Years since quitting: 42.6  Smokeless Tobacco Never Used   ROS: Per HPI   Objective:    BP 124/66   Pulse 81   Temp 97.6 F (36.4 C) (Oral)   Ht _0  (1.626 m)   BMI 39.84 kg/m   Wt Readings from Last 3 Encounters:  03/11/18 232 lb 1.6 oz (105.3 kg)  02/21/18 225 lb (102.1 kg)  02/07/18 228 lb 6.4 oz (103.6 kg)    Gen: NAD, alert, cooperative with exam, NCAT EYES: EOMI, no conjunctival injection, or no icterus ENT:  TMs pearly gray b/l, OP without erythema LYMPH: no cervical LAD CV: NRRR, normal S1/S2, no murmur, distal pulses 2+ b/l Resp: CTABL, no wheezes, normal WOB Abd: +BS, soft, NTND. Ext: No edema, warm Neuro: Alert and oriented, symmetric face MSK: small amount crepitus R TMJ with opening, closing mouth Skin: normal appearing skin b/l face. ttp along L lower jawline, L lower cheek. No redness or swelling or rash.  Assessment & Plan:  [name] was seen today for facial pain.  Diagnoses and all orders for this visit:  Facial pain Getting worse. Normal ESR/CRP recently, MRI last in 2017. Continue gabapentin. Has upcoming appt with neurology  Cerebellar  ataxia [facility] [employer]) -     DME Wheelchair manual   Follow up plan: Return in about 3 months (around 06/19/2018). Assunta Found, MD [place]

## 2018-03-23 ENCOUNTER — Observation Stay (HOSPITAL_COMMUNITY)
Admission: EM | Admit: 2018-03-23 | Discharge: 2018-03-26 | Disposition: A | Discharge status: Home / Self Care | Payer: Medicare Other | Attending: Internal Medicine | Admitting: Internal Medicine

## 2018-03-23 ENCOUNTER — Emergency Department (HOSPITAL_COMMUNITY): Payer: Medicare Other

## 2018-03-23 DIAGNOSIS — Z7901 Long term (current) use of anticoagulants: Secondary | ICD-10-CM | POA: Diagnosis not present

## 2018-03-23 DIAGNOSIS — K589 Irritable bowel syndrome without diarrhea: Secondary | ICD-10-CM | POA: Diagnosis present

## 2018-03-23 DIAGNOSIS — I639 Cerebral infarction, unspecified: Secondary | ICD-10-CM | POA: Diagnosis present

## 2018-03-23 DIAGNOSIS — E785 Hyperlipidemia, unspecified: Secondary | ICD-10-CM | POA: Diagnosis present

## 2018-03-23 DIAGNOSIS — I2699 Other pulmonary embolism without acute cor pulmonale: Secondary | ICD-10-CM

## 2018-03-23 DIAGNOSIS — F32A Depression, unspecified: Secondary | ICD-10-CM | POA: Diagnosis present

## 2018-03-23 DIAGNOSIS — D696 Thrombocytopenia, unspecified: Secondary | ICD-10-CM

## 2018-03-23 DIAGNOSIS — R0602 Shortness of breath: Secondary | ICD-10-CM | POA: Diagnosis not present

## 2018-03-23 DIAGNOSIS — Z8673 Personal history of transient ischemic attack (TIA), and cerebral infarction without residual deficits: Secondary | ICD-10-CM | POA: Insufficient documentation

## 2018-03-23 DIAGNOSIS — I309 Acute pericarditis, unspecified: Principal | ICD-10-CM | POA: Insufficient documentation

## 2018-03-23 DIAGNOSIS — Z79899 Other long term (current) drug therapy: Secondary | ICD-10-CM | POA: Diagnosis not present

## 2018-03-23 DIAGNOSIS — R0902 Hypoxemia: Secondary | ICD-10-CM | POA: Diagnosis not present

## 2018-03-23 DIAGNOSIS — D539 Nutritional anemia, unspecified: Secondary | ICD-10-CM

## 2018-03-23 DIAGNOSIS — E860 Dehydration: Secondary | ICD-10-CM

## 2018-03-23 DIAGNOSIS — G40909 Epilepsy, unspecified, not intractable, without status epilepticus: Secondary | ICD-10-CM

## 2018-03-23 DIAGNOSIS — I213 ST elevation (STEMI) myocardial infarction of unspecified site: Secondary | ICD-10-CM | POA: Diagnosis not present

## 2018-03-23 DIAGNOSIS — I480 Paroxysmal atrial fibrillation: Secondary | ICD-10-CM | POA: Diagnosis present

## 2018-03-23 DIAGNOSIS — I499 Cardiac arrhythmia, unspecified: Secondary | ICD-10-CM | POA: Diagnosis not present

## 2018-03-23 DIAGNOSIS — I10 Essential (primary) hypertension: Secondary | ICD-10-CM | POA: Diagnosis not present

## 2018-03-23 DIAGNOSIS — E871 Hypo-osmolality and hyponatremia: Secondary | ICD-10-CM

## 2018-03-23 DIAGNOSIS — R079 Chest pain, unspecified: Secondary | ICD-10-CM

## 2018-03-23 DIAGNOSIS — R0789 Other chest pain: Secondary | ICD-10-CM | POA: Diagnosis not present

## 2018-03-23 DIAGNOSIS — C3490 Malignant neoplasm of unspecified part of unspecified bronchus or lung: Secondary | ICD-10-CM | POA: Diagnosis not present

## 2018-03-23 DIAGNOSIS — F411 Generalized anxiety disorder: Secondary | ICD-10-CM | POA: Diagnosis present

## 2018-03-23 DIAGNOSIS — F419 Anxiety disorder, unspecified: Secondary | ICD-10-CM | POA: Diagnosis not present

## 2018-03-23 DIAGNOSIS — R002 Palpitations: Secondary | ICD-10-CM | POA: Diagnosis present

## 2018-03-23 DIAGNOSIS — I319 Disease of pericardium, unspecified: Secondary | ICD-10-CM | POA: Diagnosis present

## 2018-03-23 DIAGNOSIS — G119 Hereditary ataxia, unspecified: Secondary | ICD-10-CM | POA: Diagnosis not present

## 2018-03-23 DIAGNOSIS — F329 Major depressive disorder, single episode, unspecified: Secondary | ICD-10-CM | POA: Diagnosis present

## 2018-03-23 DIAGNOSIS — K219 Gastro-esophageal reflux disease without esophagitis: Secondary | ICD-10-CM

## 2018-03-23 DIAGNOSIS — G319 Degenerative disease of nervous system, unspecified: Secondary | ICD-10-CM

## 2018-03-23 HISTORY — DX: Dehydration: E86.0

## 2018-03-23 HISTORY — DX: Hypo-osmolality and hyponatremia: E87.1

## 2018-03-23 LAB — BASIC METABOLIC PANEL
ANION GAP: 11 (ref 5–15)
ANION GAP: 8 (ref 5–15)
BUN: 14 mg/dL (ref 8–23)
BUN: 15 mg/dL (ref 8–23)
CALCIUM: 8.9 mg/dL (ref 8.9–10.3)
CO2: 21 mmol/L — ABNORMAL LOW (ref 22–32)
CO2: 22 mmol/L (ref 22–32)
CREATININE: 0.95 mg/dL (ref 0.44–1.00)
Calcium: 9 mg/dL (ref 8.9–10.3)
Chloride: 94 mmol/L — ABNORMAL LOW (ref 98–111)
Chloride: 95 mmol/L — ABNORMAL LOW (ref 98–111)
Creatinine, Ser: 0.95 mg/dL (ref 0.44–1.00)
GFR calc non Af Amer: 55 mL/min — ABNORMAL LOW (ref >60)
GFR, EST NON AFRICAN AMERICAN: 55 mL/min — AB (ref >60)
GLUCOSE: 142 mg/dL — AB (ref 70–99)
Glucose, Bld: 136 mg/dL — ABNORMAL HIGH (ref 70–99)
POTASSIUM: 4.2 mmol/L (ref 3.5–5.1)
Potassium: 4.6 mmol/L (ref 3.5–5.1)
SODIUM: 126 mmol/L — AB (ref 135–145)
Sodium: 125 mmol/L — ABNORMAL LOW (ref 135–145)

## 2018-03-23 LAB — CBC WITH DIFFERENTIAL/PLATELET
ABS IMMATURE GRANULOCYTES: 0 10*3/uL (ref 0.0–0.1)
BASOS ABS: 0 10*3/uL (ref 0.0–0.1)
BASOS PCT: 0 %
EOS ABS: 0.1 10*3/uL (ref 0.0–0.7)
Eosinophils Relative: 1 %
HCT: 36.7 % (ref 36.0–46.0)
Hemoglobin: 12.2 g/dL (ref 12.0–15.0)
IMMATURE GRANULOCYTES: 0 %
Lymphocytes Relative: 9 %
Lymphs Abs: 0.8 10*3/uL (ref 0.7–4.0)
MCH: 34.5 pg — AB (ref 26.0–34.0)
MCHC: 33.2 g/dL (ref 30.0–36.0)
MCV: 103.7 fL — AB (ref 78.0–100.0)
MONOS PCT: 14 %
Monocytes Absolute: 1.2 10*3/uL — ABNORMAL HIGH (ref 0.1–1.0)
NEUTROS ABS: 6.4 10*3/uL (ref 1.7–7.7)
NEUTROS PCT: 76 %
Platelets: 116 10*3/uL — ABNORMAL LOW (ref 150–400)
RBC: 3.54 MIL/uL — ABNORMAL LOW (ref 3.87–5.11)
RDW: 15.9 % — AB (ref 11.5–15.5)
WBC: 8.4 10*3/uL (ref 4.0–10.5)

## 2018-03-23 LAB — TROPONIN I

## 2018-03-23 LAB — I-STAT TROPONIN, ED: TROPONIN I, POC: 0 ng/mL (ref 0.00–0.08)

## 2018-03-23 LAB — D-DIMER, QUANTITATIVE: D-Dimer, Quant: 0.41 ug/mL-FEU (ref 0.00–0.50)

## 2018-03-23 MED ORDER — LEVOTHYROXINE SODIUM 75 MCG PO TABS
75.0000 ug | ORAL_TABLET | Freq: Every day | ORAL | Status: DC
  Administered 2018-03-24 – 2018-03-26 (×3): 75 ug via ORAL
  Filled 2018-03-23 (×3): qty 1

## 2018-03-23 MED ORDER — MORPHINE SULFATE (PF) 2 MG/ML IV SOLN: 1.0000 mg | INTRAVENOUS | Status: DC | PRN

## 2018-03-23 MED ORDER — HYDROCODONE-ACETAMINOPHEN 5-325 MG PO TABS
1.0000 | ORAL_TABLET | ORAL | Status: DC | PRN
  Administered 2018-03-25: 2 via ORAL
  Filled 2018-03-23 (×2): qty 2

## 2018-03-23 MED ORDER — HYPROMELLOSE (GONIOSCOPIC) 2.5 % OP SOLN: 1.0000 [drp] | OPHTHALMIC | Status: DC | PRN

## 2018-03-23 MED ORDER — COLCHICINE 0.6 MG PO TABS
0.6000 mg | ORAL_TABLET | Freq: Every day | ORAL | Status: DC
  Administered 2018-03-23 – 2018-03-26 (×4): 0.6 mg via ORAL
  Filled 2018-03-23 (×4): qty 1

## 2018-03-23 MED ORDER — AMITRIPTYLINE HCL 25 MG PO TABS
25.0000 mg | ORAL_TABLET | Freq: Every day | ORAL | Status: DC
  Administered 2018-03-23 – 2018-03-25 (×3): 25 mg via ORAL
  Filled 2018-03-23 (×3): qty 1

## 2018-03-23 MED ORDER — NITROGLYCERIN 0.3 MG SL SUBL
0.3000 mg | SUBLINGUAL_TABLET | SUBLINGUAL | Status: DC | PRN
  Filled 2018-03-23: qty 100

## 2018-03-23 MED ORDER — ONDANSETRON HCL 4 MG PO TABS: 4.0000 mg | ORAL_TABLET | Freq: Four times a day (QID) | ORAL | Status: DC | PRN

## 2018-03-23 MED ORDER — NITROGLYCERIN 0.4 MG SL SUBL: 0.4000 mg | SUBLINGUAL_TABLET | SUBLINGUAL | Status: DC | PRN

## 2018-03-23 MED ORDER — BISACODYL 10 MG RE SUPP: 10.0000 mg | Freq: Every day | RECTAL | Status: DC | PRN

## 2018-03-23 MED ORDER — OXCARBAZEPINE 150 MG PO TABS
150.0000 mg | ORAL_TABLET | Freq: Two times a day (BID) | ORAL | Status: DC
  Administered 2018-03-23 – 2018-03-26 (×6): 150 mg via ORAL
  Filled 2018-03-23 (×6): qty 1

## 2018-03-23 MED ORDER — ESCITALOPRAM OXALATE 10 MG PO TABS
20.0000 mg | ORAL_TABLET | Freq: Every day | ORAL | Status: DC
  Administered 2018-03-23 – 2018-03-25 (×3): 20 mg via ORAL
  Filled 2018-03-23 (×3): qty 2

## 2018-03-23 MED ORDER — ASPIRIN 325 MG PO TABS
325.0000 mg | ORAL_TABLET | Freq: Every day | ORAL | Status: DC
  Administered 2018-03-24 – 2018-03-26 (×3): 325 mg via ORAL
  Filled 2018-03-23 (×3): qty 1

## 2018-03-23 MED ORDER — HEPARIN SODIUM (PORCINE) 5000 UNIT/ML IJ SOLN
5000.0000 [IU] | Freq: Three times a day (TID) | INTRAMUSCULAR | Status: DC
  Administered 2018-03-23 – 2018-03-26 (×7): 5000 [IU] via SUBCUTANEOUS
  Filled 2018-03-23 (×7): qty 1

## 2018-03-23 MED ORDER — GABAPENTIN 400 MG PO CAPS
800.0000 mg | ORAL_CAPSULE | Freq: Two times a day (BID) | ORAL | Status: DC
  Administered 2018-03-23 – 2018-03-26 (×6): 800 mg via ORAL
  Filled 2018-03-23 (×6): qty 2

## 2018-03-23 MED ORDER — PRAVASTATIN SODIUM 10 MG PO TABS
10.0000 mg | ORAL_TABLET | Freq: Every day | ORAL | Status: DC
  Administered 2018-03-23 – 2018-03-25 (×3): 10 mg via ORAL
  Filled 2018-03-23 (×4): qty 1

## 2018-03-23 MED ORDER — HYDROMORPHONE HCL 1 MG/ML IJ SOLN
1.0000 mg | Freq: Once | INTRAMUSCULAR | Status: AC
  Administered 2018-03-23: 1 mg via INTRAVENOUS
  Filled 2018-03-23: qty 1

## 2018-03-23 MED ORDER — ACETAMINOPHEN 650 MG RE SUPP: 650.0000 mg | Freq: Four times a day (QID) | RECTAL | Status: DC | PRN

## 2018-03-23 MED ORDER — ONDANSETRON HCL 4 MG/2ML IJ SOLN: 4.0000 mg | Freq: Four times a day (QID) | INTRAMUSCULAR | Status: DC | PRN

## 2018-03-23 MED ORDER — PANTOPRAZOLE SODIUM 40 MG PO TBEC
40.0000 mg | DELAYED_RELEASE_TABLET | Freq: Every day | ORAL | Status: DC
  Administered 2018-03-24 – 2018-03-26 (×3): 40 mg via ORAL
  Filled 2018-03-23 (×3): qty 1

## 2018-03-23 MED ORDER — ACETAMINOPHEN 325 MG PO TABS: 650.0000 mg | ORAL_TABLET | Freq: Four times a day (QID) | ORAL | Status: DC | PRN

## 2018-03-23 MED ORDER — SENNOSIDES-DOCUSATE SODIUM 8.6-50 MG PO TABS: 1.0000 | ORAL_TABLET | Freq: Every evening | ORAL | Status: DC | PRN

## 2018-03-23 NOTE — ED Provider Notes (Signed)
[facility] EMERGENCY DEPARTMENT Provider Note   CSN: [id number] Arrival date & time: 03/23/18  1401     History   Chief Complaint Chief Complaint  Patient presents with  . Chest Pain  . Shortness of Breath    HPI [name] is a 84 y.o. female.  HPI  84 year old female with a history of metastatic lung cancer presents with chest pain.  Started 3 days ago.  Constant pain.  Pain feels like a dull pain to the left of her mid chest.  Radiates up into her neck.  Also radiates all the way up to her head.  Pain is worse with inspiration and twisting.  No vomiting or sweating.  No abdominal pain.  Intermittent mild headache but none now.  Was given aspirin, nitroglycerin, and IV fentanyl by EMS.  A code STEMI was called based on initial ECG but this was canceled by the cardiologist.   Past Medical History:  Diagnosis Date  . Acute respiratory failure with hypoxia ([place]) 10/23/2015  . Allergic rhinitis    PT. DENIES  . Anxiety   . Arthritis    NECK  . Ataxia   . Bradycardia    primarily nocturnal  . Burning tongue syndrome 25 years  . Cancer ([place]) dx'd 06/2016   liver  . Cataract   . Cerebellar degeneration   . Chronic pericarditis   . Chronic urinary tract infection   . Complication of anesthesia    low o2 sats, coded 30 years ago  . CVA (cerebral infarction) 05/2003  . Depression   . Encounter for antineoplastic chemotherapy 10/09/2016  . Gait disorder   . Gastric polyps   . GERD (gastroesophageal reflux disease)   . Goals of care, counseling/discussion 10/09/2016  . High cholesterol   . Hyperlipidemia   . Hypotension   . Hypothyroidism   . IBS (irritable bowel syndrome)   . Obesity   . Paroxysmal atrial fibrillation ([name])    chads2vasc score is 6,  she is felt to be a poor candidate for anticoagulation  . Personal history of arterial venous malformation (AVM)    right side of face  . Seizure disorder ([place])   . Seizures ([place]) 2003   "  smelling"- Gabapentin "no problem"  . Shortness of breath dyspnea    with exertion  . Sternum fx 10/27/2013  . Stroke [facility]) 5 years ago   Right side of face weak, slurred speach-   . Thyroid disease   . TIA (transient ischemic attack)   . UTI (lower urinary tract infection) 03/27/2016   "frequently"    Patient Active Problem List   Diagnosis Date Noted  . Osteoarthritis of knee 01/10/2018  . Hypotension 12/19/2017  . Pulmonary embolism ([place]) 04/24/2017  . Pulmonary embolus ([place]) 04/23/2017  . Neuroendocrine carcinoma metastatic to liver ([place]) 12/13/2016  . Back pain 12/13/2016  . Chest pain 12/13/2016  . Goals of care, counseling/discussion 10/09/2016  . Encounter for antineoplastic chemotherapy 10/09/2016  . Neuroendocrine cancer ([place]) 08/23/2016  . Liver metastases ([place]) 08/23/2016  . Late effect of cerebrovascular accident (CVA) 08/02/2016  . H/O: CVA (cerebrovascular accident) 07/14/2016  . Meningioma ([place]) 07/14/2016  . Asthmatic bronchitis 10/23/2015  . GAD (generalized anxiety disorder) 09/30/2015  . Peripheral edema 09/30/2015  . Insomnia 09/30/2015  . Localization-related partial epilepsy with simple partial seizures ([place]) 06/27/2015  . Allergic rhinitis 05/24/2015  . Syncope 01/06/2015  . Sinus bradycardia 01/03/2015  . Paroxysmal atrial fibrillation ([place]) 01/03/2015  .  Fatigue 01/03/2015  . Morbid obesity ([place]) 01/03/2015  . Solitary pulmonary nodule 06/02/2014  . Thyroid nodule 05/08/2014  . Palpitation 05/07/2014  . Depression   . Seizure disorder ([place])   . Hypothyroidism   . Cerebellar degeneration   . IRRITABLE BOWEL SYNDROME 02/18/2008  . Hyperlipemia 09/10/2006  . Essential hypertension 09/10/2006  . GERD 09/10/2006  . CVA (cerebral infarction) 05/31/2003    Past Surgical History:  Procedure Laterality Date  . APPENDECTOMY  84 years old  . COLONOSCOPY  2006, 2009  . COLONOSCOPY WITH PROPOFOL N/A 03/28/2016   Procedure: COLONOSCOPY WITH  PROPOFOL;  Surgeon: Gatha Mayer, MD;  Location: [facility];  Service: Endoscopy;  Laterality: N/A;  . cyst removed  35 years ago  . EP IMPLANTABLE DEVICE N/A 01/06/2015   Procedure: Loop Recorder Insertion;  Surgeon: Thompson Grayer, MD;  Location: [facility] CV [facility];  Service: Cardiovascular;  Laterality: N/A;  . EYE SURGERY Right    Cataract  . IR ANGIOGRAM SELECTIVE EACH ADDITIONAL VESSEL  11/28/2016  . IR ANGIOGRAM SELECTIVE EACH ADDITIONAL VESSEL  11/28/2016  . IR ANGIOGRAM SELECTIVE EACH ADDITIONAL VESSEL  11/28/2016  . IR ANGIOGRAM SELECTIVE EACH ADDITIONAL VESSEL  11/28/2016  . IR ANGIOGRAM SELECTIVE EACH ADDITIONAL VESSEL  12/13/2016  . IR ANGIOGRAM SELECTIVE EACH ADDITIONAL VESSEL  12/13/2016  . IR ANGIOGRAM SELECTIVE EACH ADDITIONAL VESSEL  01/09/2017  . IR ANGIOGRAM VISCERAL SELECTIVE  11/28/2016  . IR ANGIOGRAM VISCERAL SELECTIVE  11/28/2016  . IR ANGIOGRAM VISCERAL SELECTIVE  12/13/2016  . IR ANGIOGRAM VISCERAL SELECTIVE  12/13/2016  . IR ANGIOGRAM VISCERAL SELECTIVE  01/09/2017  . IR EMBO ARTERIAL NOT HEMORR HEMANG INC GUIDE ROADMAPPING  11/28/2016  . IR EMBO TUMOR ORGAN ISCHEMIA INFARCT INC GUIDE ROADMAPPING  12/13/2016  . IR EMBO TUMOR ORGAN ISCHEMIA INFARCT INC GUIDE ROADMAPPING  01/09/2017  . IR IVC FILTER PLMT / S&I /IMG GUID/MOD SED  04/24/2017  . IR RADIOLOGIST EVAL & MGMT  11/06/2016  . IR RADIOLOGIST EVAL & MGMT  01/02/2017  . IR RADIOLOGIST EVAL & MGMT  02/05/2017  . IR RADIOLOGIST EVAL & MGMT  05/02/2017  . IR RADIOLOGIST EVAL & MGMT  10/17/2017  . IR US GUIDE VASC ACCESS RIGHT  11/28/2016  . IR US GUIDE VASC ACCESS RIGHT  12/13/2016  . IR US GUIDE VASC ACCESS RIGHT  01/09/2017  . KNEE ARTHROSCOPY Right 11/14/2006  . KNEE ARTHROSCOPY Bilateral 5 and 6 years ago  . KNEE ARTHROSCOPY WITH LATERAL MENISECTOMY  07/03/2012   Procedure: KNEE ARTHROSCOPY WITH LATERAL MENISECTOMY;  Surgeon: Magnus Sinning, MD;  Location: [facility];  Service: Orthopedics;  Laterality: Left;  with Partial Lateral  Menisectomy and Medial Menisectomy. Shaving of medial and lateral femoral condyles. Shaving of patella. Removal of a loose body  . tibial and fibular internal fixation Left   . TOTAL ABDOMINAL HYSTERECTOMY  84 years old  . UPPER GASTROINTESTINAL ENDOSCOPY  2009, 2013     OB History   None      Home Medications    Prior to Admission medications   Medication Sig Start Date End Date Taking? Authorizing Provider  amitriptyline (ELAVIL) 25 MG tablet Take 1 tablet (25 mg total) by mouth at bedtime. 03/07/18  Yes Eustaquio Maize, MD  capecitabine (XELODA) 500 MG tablet TAKE 3 TABLETS (1,500 MG TOTAL) BY MOUTH 2 (TWO) TIMES DAILY AFTER A MEAL. Patient taking differently: Take 1,500 mg by mouth 2 (two) times daily after a meal. TAKE 3 TABLETS (1,500  MG TOTAL) BY MOUTH 2 (TWO) TIMES DAILY AFTER A MEAL FOR 14 DAYS AS DIRECTED 02/04/18  Yes Curcio, Roselie Awkward, NP  escitalopram (LEXAPRO) 20 MG tablet Take 1 tablet (20 mg total) by mouth at bedtime. 03/07/18  Yes Eustaquio Maize, MD  fluticasone (FLONASE) 50 MCG/ACT nasal spray Place 2 sprays into both nostrils at bedtime. 08/08/17  Yes Eustaquio Maize, MD  furosemide (LASIX) 20 MG tablet TAKE 1 TABLET DAILY AS NEEDED FOR FLUID RETENTION Patient taking differently: Take 20 mg by mouth as needed for edema. TAKE 1 TABLET DAILY AS NEEDED FOR FLUID RETENTION 03/07/18  Yes Eustaquio Maize, MD  gabapentin (NEURONTIN) 400 MG capsule Take 1-2 capsules (400-800 mg total) by mouth 3 (three) times daily. Patient taking differently: Take 800 mg by mouth 2 (two) times daily.  03/07/18  Yes Eustaquio Maize, MD  hydroxypropyl methylcellulose / hypromellose (ISOPTO TEARS / GONIOVISC) 2.5 % ophthalmic solution Place 1 drop into both eyes as needed.    Yes [provider]  levothyroxine (SYNTHROID, LEVOTHROID) 75 MCG tablet TAKE 1 TABLET DAILY BEFORE BREAKFAST Patient taking differently: Take 75 mcg by mouth daily before breakfast.  02/28/18  Yes Eustaquio Maize, MD    lovastatin (MEVACOR) 40 MG tablet Take 1 tablet (40 mg total) by mouth at bedtime. 03/07/18  Yes Eustaquio Maize, MD  Melatonin 3 MG TBDP Take 3-6 mg by mouth at bedtime as needed. Patient taking differently: Take 3-6 mg by mouth at bedtime as needed (FOR SLEEP).  08/02/16  Yes Eustaquio Maize, MD  omeprazole (PRILOSEC) 40 MG capsule Take 1 capsule (40 mg total) by mouth daily. 03/07/18  Yes Eustaquio Maize, MD  ondansetron (ZOFRAN) 8 MG tablet Take 1 tablet (8 mg total) by mouth every 8 (eight) hours as needed for nausea or vomiting. 02/07/18  Yes Eustaquio Maize, MD  OXcarbazepine (TRILEPTAL) 150 MG tablet Take 1 tablet (150 mg total) by mouth 2 (two) times daily. 03/19/18  Yes Jaffe, Adam R, DO  oxyCODONE-acetaminophen (PERCOCET) 5-325 MG tablet Take 1 tablet by mouth every 8 (eight) hours as needed for severe pain. 03/12/18  Yes Gottschalk, Leatrice Jewels M, DO  temozolomide (TEMODAR) 140 MG capsule TAKE 2 CAPSULES BY MOUTH DAILY. MAY TAKE ON AN EMPTY STOMACH OR AT BEDTIME TO DECREASE NAUSEA AND VOMITING Patient taking differently: Take 280 mg by mouth daily. TAKE 2 CAPSULES BY MOUTH DAILY. MAY TAKE ON AN EMPTY STOMACH OR AT BEDTIME TO DECREASE NAUSEA AND VOMITING *TO START 5 DAYS PRIOR TO COMPLETION OF 14 DAY COURSE OF XELODA REGIMEN AS DIRECTED* 02/04/18  Yes Curcio, Roselie Awkward, NP  XARELTO 20 MG TABS tablet TAKE 1 TABLET DAILY WITH SUPPER Patient taking differently: Take 20 mg by mouth daily with supper.  02/24/18  Yes Curt Bears, MD    Family History Family History  Problem Relation Age of Onset  . Heart attack Father 55       fatal  . Coronary artery disease Brother   . Diabetes Brother   . Prostate cancer Brother   . Prostate cancer Son     Social History Social History   Tobacco Use  . Smoking status: Former Smoker    Packs/day: 0.25    Years: 10.00    Pack years: 2.50    Types: Cigarettes    Last attempt to quit: 07/31/1975    Years since quitting: 42.6  . Smokeless tobacco: Never  Used  Substance Use Topics  . Alcohol  use: No    Comment: Rare- maybe a drink ever 2 years  . Drug use: No     Allergies   Lisinopril   Review of Systems Review of Systems  Constitutional: Negative for fever.  Respiratory: Negative for shortness of breath.   Cardiovascular: Positive for chest pain.  Gastrointestinal: Negative for abdominal pain, nausea and vomiting.  Neurological: Negative for dizziness.  All other systems reviewed and are negative.    Physical Exam Updated Vital Signs BP (!) 125/95   Pulse 64   Temp (!) 97.5 F (36.4 C) (Oral)   Resp 14   SpO2 94%   Physical Exam  Constitutional: She is oriented to person, place, and time. She appears well-developed and well-nourished.  Non-toxic appearance. She does not appear ill.  HENT:  Head: Normocephalic and atraumatic.  Right Ear: External ear normal.  Left Ear: External ear normal.  Nose: Nose normal.  Eyes: Right eye exhibits no discharge. Left eye exhibits no discharge.  Cardiovascular: Normal rate, regular rhythm and normal heart sounds.  Pulses:      Radial pulses are 2+ on the right side, and 2+ on the left side.  Pulmonary/Chest: Effort normal and breath sounds normal. She exhibits no tenderness.  Abdominal: Soft. There is no tenderness.  Musculoskeletal:       Right lower leg: She exhibits no edema.       Left lower leg: She exhibits no edema.  Neurological: She is alert and oriented to person, place, and time.  Skin: Skin is warm and dry.  Nursing note and vitals reviewed.    ED Treatments / Results  Labs (all labs ordered are listed, but only abnormal results are displayed) Labs Reviewed  BASIC METABOLIC PANEL - Abnormal; Notable for the following components:      Result Value   Sodium 126 (*)    Chloride 94 (*)    CO2 21 (*)    Glucose, Bld 136 (*)    GFR calc non Af Amer 55 (*)    All other components within normal limits  CBC WITH DIFFERENTIAL/PLATELET - Abnormal; Notable for the  following components:   RBC 3.54 (*)    MCV 103.7 (*)    MCH 34.5 (*)    RDW 15.9 (*)    Platelets 116 (*)    Monocytes Absolute 1.2 (*)    All other components within normal limits  TROPONIN I  D-DIMER, QUANTITATIVE (NOT AT [facility])  I-STAT TROPONIN, ED    EKG EKG Interpretation  Date/Time:  Sunday March 23 2018 14:20:25 EDT Ventricular Rate:  62 PR Interval:    QRS Duration: 90 QT Interval:  475 QTC Calculation: 483 R Axis:   27 Text Interpretation:  Atrial fibrillation Abnormal R-wave progression, early transition ST elevation suggests acute pericarditis no significant change since earlier in the day Confirmed by [place] Gambler [id number]) on 03/23/2018 3:24:26 PM   Radiology Dg Chest Portable 1 View  Result Date: 03/23/2018 CLINICAL DATA:  Chest pain and shortness of breath EXAM: PORTABLE CHEST 1 VIEW COMPARISON:  Chest CT from 02/05/2018 FINDINGS: The patient is rotated to the left on today's radiograph, reducing diagnostic sensitivity and specificity. Atherosclerotic calcification of the aortic arch. Bandlike scarring or atelectasis in the lingula. Low lung volumes are present, causing crowding of the pulmonary vasculature. Mild cardiomegaly. Mildly indistinct pulmonary vasculature. No overt airspace edema. Degenerative glenohumeral arthropathy on the left. IMPRESSION: 1. Mild enlargement of the cardiopericardial silhouette, potentially with pulmonary venous hypertension, but without edema.  2.  Aortic Atherosclerosis (ICD10-I70.0). 3. Scarring or atelectasis in the lingula. 4. The patient has known pulmonary nodules related to malignancy which are not readily apparent on today's radiograph. Electronically Signed   By: Van Clines M.D.   On: 03/23/2018 14:31    Procedures Procedures (including critical care time)    EMERGENCY DEPARTMENT [place] CARDIAC EXAM "Study: Limited Ultrasound of the Heart and Pericardium"  INDICATIONS:Chest pain Multiple views of the heart and  pericardium were obtained in real-time with a multi-frequency probe.  PERFORMED AY:GEFUWT IMAGES ARCHIVED?: Yes LIMITATIONS:  Body habitus VIEWS USED: Parasternal long axis INTERPRETATION: Cardiac activity present, Pericardial effusion present, Amount of pericardial effusion is small and Cardiac tamponade absent     Medications Ordered in ED Medications  HYDROmorphone (DILAUDID) injection 1 mg (1 mg Intravenous Given 03/23/18 1429)     Initial Impression / Assessment and Plan / ED Course  I have reviewed the triage vital signs and the nursing notes.  Pertinent labs & imaging results that were available during my care of the patient were reviewed by me and considered in my medical decision making (see chart for details).     Patient has diffuse ST elevation, which appears new for her but no reciprocal changes. Constant CP x 3 days. Dr. Johnsie Cancel reviewed ECG.  Not a STEMI.  Could be consistent with pericarditis given her pleuritic pain and pain worse with certain movement such as laying flat.  It is better sitting up.  While it is pleuritic, she is already covered for PE with her blood thinner and filter.  Given her active cancer history, I did send a d-dimer but this is negative.  I think she will need treatment for the chest pain/likely pericarditis.  My bedside echo is very limited due to body habitus but there does appear to be a small pericardial effusion but no obvious tamponade.  She will probably need a formal echo. Hospitalist to admit.  Final Clinical Impressions(s) / ED Diagnoses   Final diagnoses:  Acute chest pain    ED Discharge Orders    None       [place] Gambler, MD 03/23/18 1556

## 2018-03-23 NOTE — H&P (Addendum)
History and Physical    [name] DPO:[id number] DOB: [birth year] DOA: 03/23/2018   PCP: Eustaquio Maize, MD   Patient coming from:  Home    Chief Complaint: chest pain   HPI: [name] is a 84 y.o. female with extensive medical history listed below, including metastatic lung cancer, on Xeloda and Temodar, just finishing a cycle on 03/22/2018, for next cycle on April 06, 2018, 2 weeks on 2-week of under the care of Dr. Earlie Server, history of atrial fibrillation on Xarelto chadsvasc score of 6, hypertension, hypothyroidism, irritable bowel syndrome, depression, history of CVA, history of PE status post IVC filter, also on chronic anticoagulation with Xarelto, presenting today with 3-day history of worsening chest pain, constant, dull, especially in the left and mid chest, radiating up to her neck.  The pain is worse with inspiration, twisting, and lying down.  She denies any nausea, vomiting or diaphoresis.  She denies any abdominal pain.  No dysuria or gross hematuria.  The patient denies any lower extremity swelling, or calf pain. Patient is compliant with her medications, especially with her blood thinners.  She denies any recent long distance trips, or recent surgeries.  No recent hospitalizations.  No tobacco, alcohol or recreational drug use.  The pain is not reproducible.   ED Course:  BP 113/64   Pulse 62   Temp (!) 97.5 F (36.4 C) (Oral)   Resp 20   SpO2 93%    At the ER, the patient was given aspirin, nitroglycerin, IV fentanyl, code STEMI was called, but after cardiology read her EKG, which was not consistent with MI, this was canceled, and the patient work-up continued at the ER Sodium 126, potassium 4.6, bicarb 21, glucose 136, BUN 14, creatinine 0.95, troponin 0 0.03, with repeat 0.  White count 8.4, hemoglobin 12.2, platelets 116. Monocytes 1.2, chronic. Last 2D echo in September 2018 showed EF 60 to 65%, with mild systolic, Chest x-ray shows mild enlargement of the  cardiopericardial silhouette, potentiallywith pulmonary venous hypertension, but without edema.  Known pulmonary nodules, atelectasis, but no infiltrates, or other findings are noted. EKG shows atrial fibrillation, abnormal R wave progression early transition, ST elevation, which is suspicious for acute pericarditis.  This was evaluated by cardiology, who recommended that the patient start on colchicine, aspirin, while holding Xarelto.  Of note, the patient does report that she did have one episode 30 years ago of pericarditis, treated with indomethacin. Received Dilaudid for pain, without significant improvement of her symptoms.    Review of Systems:  As per HPI otherwise all other systems reviewed and are negative  Past Medical History:  Diagnosis Date  . Acute respiratory failure with hypoxia ([place]) 10/23/2015  . Allergic rhinitis    PT. DENIES  . Anxiety   . Arthritis    NECK  . Ataxia   . Bradycardia    primarily nocturnal  . Burning tongue syndrome 25 years  . Cancer ([place]) dx'd 06/2016   liver  . Cataract   . Cerebellar degeneration   . Chronic pericarditis   . Chronic urinary tract infection   . Complication of anesthesia    low o2 sats, coded 30 years ago  . CVA (cerebral infarction) 05/2003  . Depression   . Encounter for antineoplastic chemotherapy 10/09/2016  . Gait disorder   . Gastric polyps   . GERD (gastroesophageal reflux disease)   . Goals of care, counseling/discussion 10/09/2016  . High cholesterol   . Hyperlipidemia   .  Hypotension   . Hypothyroidism   . IBS (irritable bowel syndrome)   . Obesity   . Paroxysmal atrial fibrillation ([name])    chads2vasc score is 6,  she is felt to be a poor candidate for anticoagulation  . Personal history of arterial venous malformation (AVM)    right side of face  . Seizure disorder ([place])   . Seizures ([place]) 2003   " smelling"- Gabapentin "no problem"  . Shortness of breath dyspnea    with exertion  . Sternum fx  10/27/2013  . Stroke [facility]) 5 years ago   Right side of face weak, slurred speach-   . Thyroid disease   . TIA (transient ischemic attack)   . UTI (lower urinary tract infection) 03/27/2016   "frequently"    Past Surgical History:  Procedure Laterality Date  . APPENDECTOMY  84 years old  . COLONOSCOPY  2006, 2009  . COLONOSCOPY WITH PROPOFOL N/A 03/28/2016   Procedure: COLONOSCOPY WITH PROPOFOL;  Surgeon: Gatha Mayer, MD;  Location: [facility];  Service: Endoscopy;  Laterality: N/A;  . cyst removed  35 years ago  . EP IMPLANTABLE DEVICE N/A 01/06/2015   Procedure: Loop Recorder Insertion;  Surgeon: Thompson Grayer, MD;  Location: [facility];  Service: Cardiovascular;  Laterality: N/A;  . EYE SURGERY Right    Cataract  . IR ANGIOGRAM SELECTIVE EACH ADDITIONAL VESSEL  11/28/2016  . IR ANGIOGRAM SELECTIVE EACH ADDITIONAL VESSEL  11/28/2016  . IR ANGIOGRAM SELECTIVE EACH ADDITIONAL VESSEL  11/28/2016  . IR ANGIOGRAM SELECTIVE EACH ADDITIONAL VESSEL  11/28/2016  . IR ANGIOGRAM SELECTIVE EACH ADDITIONAL VESSEL  12/13/2016  . IR ANGIOGRAM SELECTIVE EACH ADDITIONAL VESSEL  12/13/2016  . IR ANGIOGRAM SELECTIVE EACH ADDITIONAL VESSEL  01/09/2017  . IR ANGIOGRAM VISCERAL SELECTIVE  11/28/2016  . IR ANGIOGRAM VISCERAL SELECTIVE  11/28/2016  . IR ANGIOGRAM VISCERAL SELECTIVE  12/13/2016  . IR ANGIOGRAM VISCERAL SELECTIVE  12/13/2016  . IR ANGIOGRAM VISCERAL SELECTIVE  01/09/2017  . IR EMBO ARTERIAL NOT HEMORR HEMANG INC GUIDE ROADMAPPING  11/28/2016  . IR EMBO TUMOR ORGAN ISCHEMIA INFARCT INC GUIDE ROADMAPPING  12/13/2016  . IR EMBO TUMOR ORGAN ISCHEMIA INFARCT INC GUIDE ROADMAPPING  01/09/2017  . IR IVC FILTER PLMT / S&I /IMG GUID/MOD SED  04/24/2017  . IR RADIOLOGIST EVAL & MGMT  11/06/2016  . IR RADIOLOGIST EVAL & MGMT  01/02/2017  . IR RADIOLOGIST EVAL & MGMT  02/05/2017  . IR RADIOLOGIST EVAL & MGMT  05/02/2017  . IR RADIOLOGIST EVAL & MGMT  10/17/2017  . IR US GUIDE VASC ACCESS RIGHT  11/28/2016  . IR US  GUIDE VASC ACCESS RIGHT  12/13/2016  . IR US GUIDE VASC ACCESS RIGHT  01/09/2017  . KNEE ARTHROSCOPY Right 11/14/2006  . KNEE ARTHROSCOPY Bilateral 5 and 6 years ago  . KNEE ARTHROSCOPY WITH LATERAL MENISECTOMY  07/03/2012   Procedure: KNEE ARTHROSCOPY WITH LATERAL MENISECTOMY;  Surgeon: Magnus Sinning, MD;  Location: [facility];  Service: Orthopedics;  Laterality: Left;  with Partial Lateral Menisectomy and Medial Menisectomy. Shaving of medial and lateral femoral condyles. Shaving of patella. Removal of a loose body  . tibial and fibular internal fixation Left   . TOTAL ABDOMINAL HYSTERECTOMY  85 years old  . UPPER GASTROINTESTINAL ENDOSCOPY  2009, 2013    Social History Social History   Socioeconomic History  . Marital status: Married    Spouse name: Not on file  . Number of children: 6  . Years  of education: Not on file  . Highest education level: 10th grade  Occupational History  . Occupation: Agricultural engineer  Social Needs  . Financial resource strain: Not hard at all  . Food insecurity:    Worry: Never true    Inability: Never true  . Transportation needs:    Medical: No    Non-medical: No  Tobacco Use  . Smoking status: Former Smoker    Packs/day: 0.25    Years: 10.00    Pack years: 2.50    Types: Cigarettes    Last attempt to quit: 07/31/1975    Years since quitting: 42.6  . Smokeless tobacco: Never Used  Substance and Sexual Activity  . Alcohol use: No    Comment: Rare- maybe a drink ever 2 years  . Drug use: No  . Sexual activity: Not Currently  Lifestyle  . Physical activity:    Days per week: 0 days    Minutes per session: 0 min  . Stress: Not at all  Relationships  . Social connections:    Talks on phone: More than three times a week    Gets together: More than three times a week    Attends religious service: Never    Active member of club or organization: No    Attends meetings of clubs or organizations: Never    Relationship status: Married  . Intimate  partner violence:    Fear of current or ex partner: No    Emotionally abused: No    Physically abused: No    Forced sexual activity: No  Other Topics Concern  . Not on file  Social History Narrative   Lives with husband, does have stairs, does not use them. Pt completed 10th grade.     Allergies  Allergen Reactions  . Lisinopril Cough    Family History  Problem Relation Age of Onset  . Heart attack Father 41       fatal  . Coronary artery disease Brother   . Diabetes Brother   . Prostate cancer Brother   . Prostate cancer Son        Prior to Admission medications   Medication Sig Start Date End Date Taking? Authorizing Provider  amitriptyline (ELAVIL) 25 MG tablet Take 1 tablet (25 mg total) by mouth at bedtime. 03/07/18  Yes Eustaquio Maize, MD  capecitabine (XELODA) 500 MG tablet TAKE 3 TABLETS (1,500 MG TOTAL) BY MOUTH 2 (TWO) TIMES DAILY AFTER A MEAL. Patient taking differently: Take 1,500 mg by mouth 2 (two) times daily after a meal. TAKE 3 TABLETS (1,500 MG TOTAL) BY MOUTH 2 (TWO) TIMES DAILY AFTER A MEAL FOR 14 DAYS AS DIRECTED 02/04/18  Yes Curcio, Roselie Awkward, NP  escitalopram (LEXAPRO) 20 MG tablet Take 1 tablet (20 mg total) by mouth at bedtime. 03/07/18  Yes Eustaquio Maize, MD  fluticasone (FLONASE) 50 MCG/ACT nasal spray Place 2 sprays into both nostrils at bedtime. 08/08/17  Yes Eustaquio Maize, MD  furosemide (LASIX) 20 MG tablet TAKE 1 TABLET DAILY AS NEEDED FOR FLUID RETENTION Patient taking differently: Take 20 mg by mouth as needed for edema. TAKE 1 TABLET DAILY AS NEEDED FOR FLUID RETENTION 03/07/18  Yes Eustaquio Maize, MD  gabapentin (NEURONTIN) 400 MG capsule Take 1-2 capsules (400-800 mg total) by mouth 3 (three) times daily. Patient taking differently: Take 800 mg by mouth 2 (two) times daily.  03/07/18  Yes Eustaquio Maize, MD  hydroxypropyl methylcellulose / hypromellose (ISOPTO TEARS / GONIOVISC)  2.5 % ophthalmic solution Place 1 drop into both eyes as  needed.    Yes [provider]  levothyroxine (SYNTHROID, LEVOTHROID) 75 MCG tablet TAKE 1 TABLET DAILY BEFORE BREAKFAST Patient taking differently: Take 75 mcg by mouth daily before breakfast.  02/28/18  Yes Eustaquio Maize, MD  lovastatin (MEVACOR) 40 MG tablet Take 1 tablet (40 mg total) by mouth at bedtime. 03/07/18  Yes Eustaquio Maize, MD  Melatonin 3 MG TBDP Take 3-6 mg by mouth at bedtime as needed. Patient taking differently: Take 3-6 mg by mouth at bedtime as needed (FOR SLEEP).  08/02/16  Yes Eustaquio Maize, MD  omeprazole (PRILOSEC) 40 MG capsule Take 1 capsule (40 mg total) by mouth daily. 03/07/18  Yes Eustaquio Maize, MD  ondansetron (ZOFRAN) 8 MG tablet Take 1 tablet (8 mg total) by mouth every 8 (eight) hours as needed for nausea or vomiting. 02/07/18  Yes Eustaquio Maize, MD  OXcarbazepine (TRILEPTAL) 150 MG tablet Take 1 tablet (150 mg total) by mouth 2 (two) times daily. 03/19/18  Yes Jaffe, Adam R, DO  oxyCODONE-acetaminophen (PERCOCET) 5-325 MG tablet Take 1 tablet by mouth every 8 (eight) hours as needed for severe pain. 03/12/18  Yes Gottschalk, Leatrice Jewels M, DO  temozolomide (TEMODAR) 140 MG capsule TAKE 2 CAPSULES BY MOUTH DAILY. MAY TAKE ON AN EMPTY STOMACH OR AT BEDTIME TO DECREASE NAUSEA AND VOMITING Patient taking differently: Take 280 mg by mouth daily. TAKE 2 CAPSULES BY MOUTH DAILY. MAY TAKE ON AN EMPTY STOMACH OR AT BEDTIME TO DECREASE NAUSEA AND VOMITING *TO START 5 DAYS PRIOR TO COMPLETION OF 14 DAY COURSE OF XELODA REGIMEN AS DIRECTED* 02/04/18  Yes Curcio, Roselie Awkward, NP  XARELTO 20 MG TABS tablet TAKE 1 TABLET DAILY WITH SUPPER Patient taking differently: Take 20 mg by mouth daily with supper.  02/24/18  Yes Curt Bears, MD     Physical Exam:  Vitals:   03/23/18 1530 03/23/18 1545 03/23/18 1600 03/23/18 1615  BP: (!) 125/95 (!) 107/55 110/60 113/64  Pulse: 64 62 63 62  Resp: 14 18 19 20   Temp:      TempSrc:      SpO2: 94% 93% 94% 93%   Constitutional:  NAD, calm, appears uncomfortable due to the dull chest pain in the substernal area.   Eyes: PERRL, lids and conjunctivae normal ENMT: Mucous membranes are moist, without exudate or lesions  Neck: normal, supple, no masses, no thyromegaly Respiratory: clear to auscultation bilaterally, no wheezing, no crackles. Normal respiratory effort  Cardiovascular: Irregularly irregular rate and rhythm, very soft 1 out of 6 systolic murmur, rubs or gallops. No extremity edema. 2+ pedal pulses. No carotid bruits.  Abdomen: Soft, obese non tender, No hepatosplenomegaly. Bowel sounds positive.  Musculoskeletal: no clubbing / cyanosis. Moves all extremities Skin: no jaundice, No lesions.  Neurologic: Sensation intact  Strength equal in all extremities Psychiatric:   Alert and oriented x 3. Normal mood.     Labs on Admission: I have personally reviewed following labs and imaging studies  CBC: Recent Labs  Lab 03/23/18 1414  WBC 8.4  NEUTROABS 6.4  HGB 12.2  HCT 36.7  MCV 103.7*  PLT 116*    Basic Metabolic Panel: Recent Labs  Lab 03/23/18 1414  NA 126*  K 4.6  CL 94*  CO2 21*  GLUCOSE 136*  BUN 14  CREATININE 0.95  CALCIUM 8.9    GFR: Estimated Creatinine Clearance: 54.9 mL/min (by C-G formula based on SCr  of 0.95 mg/dL).  Liver Function Tests: No results for input(s): AST, ALT, ALKPHOS, BILITOT, PROT, ALBUMIN in the last 168 hours. No results for input(s): LIPASE, AMYLASE in the last 168 hours. No results for input(s): AMMONIA in the last 168 hours.  Coagulation Profile: No results for input(s): INR, PROTIME in the last 168 hours.  Cardiac Enzymes: Recent Labs  Lab 03/23/18 1414  TROPONINI <0.03    BNP (last 3 results) No results for input(s): PROBNP in the last 8760 hours.  HbA1C: No results for input(s): HGBA1C in the last 72 hours.  CBG: No results for input(s): GLUCAP in the last 168 hours.  Lipid Profile: No results for input(s): CHOL, HDL, LDLCALC, TRIG,  CHOLHDL, LDLDIRECT in the last 72 hours.  Thyroid Function Tests: No results for input(s): TSH, T4TOTAL, FREET4, T3FREE, THYROIDAB in the last 72 hours.  Anemia Panel: No results for input(s): VITAMINB12, FOLATE, FERRITIN, TIBC, IRON, RETICCTPCT in the last 72 hours.  Urine analysis:    Component Value Date/Time   COLORURINE YELLOW 01/13/2017 1901   APPEARANCEUR Clear 06/19/2017 1254   LABSPEC 1.010 01/13/2017 1901   PHURINE 6.5 01/13/2017 1901   GLUCOSEU Trace (A) 06/19/2017 1254   HGBUR NEGATIVE 01/13/2017 1901   BILIRUBINUR Negative 06/19/2017 1254   KETONESUR NEGATIVE 01/13/2017 1901   PROTEINUR Negative 06/19/2017 1254   PROTEINUR NEGATIVE 01/13/2017 1901   UROBILINOGEN negative 05/24/2015 1434   NITRITE Positive (A) 06/19/2017 1254   NITRITE NEGATIVE 01/13/2017 1901   LEUKOCYTESUR 1+ (A) 06/19/2017 1254    Sepsis Labs: @LABRCNTIP (procalcitonin:4,lacticidven:4) )No results found for this or any previous visit (from the past 240 hour(s)).   Radiological Exams on Admission: Dg Chest Portable 1 View  Result Date: 03/23/2018 CLINICAL DATA:  Chest pain and shortness of breath EXAM: PORTABLE CHEST 1 VIEW COMPARISON:  Chest CT from 02/05/2018 FINDINGS: The patient is rotated to the left on today's radiograph, reducing diagnostic sensitivity and specificity. Atherosclerotic calcification of the aortic arch. Bandlike scarring or atelectasis in the lingula. Low lung volumes are present, causing crowding of the pulmonary vasculature. Mild cardiomegaly. Mildly indistinct pulmonary vasculature. No overt airspace edema. Degenerative glenohumeral arthropathy on the left. IMPRESSION: 1. Mild enlargement of the cardiopericardial silhouette, potentially with pulmonary venous hypertension, but without edema. 2.  Aortic Atherosclerosis (ICD10-I70.0). 3. Scarring or atelectasis in the lingula. 4. The patient has known pulmonary nodules related to malignancy which are not readily apparent on today's  radiograph. Electronically Signed   By: Van Clines M.D.   On: 03/23/2018 14:31    EKG: Independently reviewed.  Assessment/Plan Principal Problem:   Pericarditis Active Problems:   Hyperlipemia   Essential hypertension   GERD (gastroesophageal reflux disease)   Irritable bowel syndrome   Cerebellar degeneration   Depression   Cerebral infarction ([name])   Seizure disorder ([name])   Palpitation   Paroxysmal atrial fibrillation ([name])   GAD (generalized anxiety disorder)   Pulmonary embolism ([name])   Lung cancer ([name])   Hyponatremia   Thrombocytopenia ([name])    At the ER, the patient was given aspirin, nitroglycerin, IV fentanyl, code STEMI was called, but after cardiology read her EKG, which was not consistent with MI, this was canceled, and the patient work-up continued at the ER Sodium 126, potassium 4.6, bicarb 21, glucose 136, BUN 14, creatinine 0.95, troponin 0 0.03, with repeat 0.  White count 8.4, hemoglobin 12.2, platelets 116. Monocytes 1.2, chronic. Last 2D echo in September 2018 showed EF 60 to 65%, with mild systolic, Chest  x-ray shows mild enlargement of the cardiopericardial silhouette, potentiallywith pulmonary venous hypertension, but without edema.  Known pulmonary nodules, atelectasis, but no infiltrates, or other findings are noted. EKG shows atrial fibrillation, abnormal R wave progression early transition, ST elevation, which is suspicious for acute pericarditis.  This was evaluated by cardiology, who recommended that the patient start on colchicine, aspirin, while holding Xarelto.  Of note, the patient does report that she did have one episode 30 years ago of pericarditis, treated with indomethacin. Received Dilaudid for pain, without significant improvement of her symptoms.  Chest Pain reproducible on respiration, worse when lying flat, suspicious for pericarditis, confirmed by EKG, which show abnormal R wave progression early transition, ST elevation, which is  suspicious for acute pericarditis.  This was evaluated by cardiology, who recommended that the patient start on colchicine, aspirin, while holding Xarelto.  Troponin is negative.  Chest x-ray.  She received Tylenol for pain, without significant improvement of her symptoms.  Low suspicion for PE, as patient is on chronic anticoagulation, and status post IVC placement for prior history of PE. Admit for telemetry observation Follow EKG in a.m., serial troponin. Pain meds  Appreciate Dr. Jesse Fall reading the EKG, and will proceed with his recommendations, which include holding Xarelto, continue with aspirin, and colchicine for the treatment of pericarditis.  Chronic hyponatremia, in the setting of lung cancer.  Currently 126, baseline between 126 and 132.  The patient appears asymptomatic for  hyponatremia. Gentle hydration. Continue to monitor with BMET tonight and  in a.m.  Thrombocytopenia Likely due to malignancy, currently 116,000.  No bleeding issues are noted.   No transfusion is indicated at this time Monitor counts closely Transfuse 1 unit of platelets if count is less or equal than 10,000 or 20,000 if the patient is acutely bleeding Hold heparin subcu if  platelets drop to less than 50,000  GERD, no acute symptoms Continue PPI   Hypertension BP 113/64   Pulse 62  Controlled Continue home anti-hypertensive medications    Hyperlipidemia Continue home statins  Anxiety Epilepsy  Continue Trileptal  Hypothyroidism: Continue home Synthroid  Depression Continue home Lexapro, Elavil     DVT prophylaxis: Heparin subcu.  Will hold Xarelto for now in view of pericarditis, needing medications which may interact with this med. Code Status:    Full code Family Communication:  Discussed with patient Disposition Plan: Expect patient to be discharged to home after condition improves Consults called:    None Admission status: Telemetry observation.   Sharene Butters, PA-C [facility]   Amion text  [phone]   03/23/2018, 5:01 PM

## 2018-03-23 NOTE — Progress Notes (Signed)
Text paged [facility] regarding abnormal lab work. Dr. Silas [place] called back, stated labs chronic, no orders received. Will continue to monitor patient.

## 2018-03-23 NOTE — ED Triage Notes (Signed)
Pt in from home via [facility] for SOB with R sided radiating CP radiating to R arm and R neck onset x 3 days, denies n/v/d, pt rcvd 324 mg ASA and x 2 sL nitro, pt had 75 mcg Fentanyl pta, pt has Stage 4 lung cancer, pt placed on 4 L [place] in route, RR 26

## 2018-03-23 NOTE — ED Notes (Signed)
Admitting MD at bedside.

## 2018-03-24 ENCOUNTER — Encounter (HOSPITAL_COMMUNITY): Payer: Self-pay | Admitting: *Deleted

## 2018-03-24 ENCOUNTER — Other Ambulatory Visit: Payer: Self-pay

## 2018-03-24 ENCOUNTER — Observation Stay (HOSPITAL_BASED_OUTPATIENT_CLINIC_OR_DEPARTMENT_OTHER): Payer: Medicare Other

## 2018-03-24 DIAGNOSIS — I351 Nonrheumatic aortic (valve) insufficiency: Secondary | ICD-10-CM

## 2018-03-24 DIAGNOSIS — D539 Nutritional anemia, unspecified: Secondary | ICD-10-CM

## 2018-03-24 DIAGNOSIS — I2782 Chronic pulmonary embolism: Secondary | ICD-10-CM

## 2018-03-24 DIAGNOSIS — C3490 Malignant neoplasm of unspecified part of unspecified bronchus or lung: Secondary | ICD-10-CM | POA: Diagnosis not present

## 2018-03-24 DIAGNOSIS — I309 Acute pericarditis, unspecified: Secondary | ICD-10-CM | POA: Diagnosis not present

## 2018-03-24 DIAGNOSIS — G119 Hereditary ataxia, unspecified: Secondary | ICD-10-CM | POA: Diagnosis not present

## 2018-03-24 DIAGNOSIS — Z8673 Personal history of transient ischemic attack (TIA), and cerebral infarction without residual deficits: Secondary | ICD-10-CM | POA: Diagnosis not present

## 2018-03-24 DIAGNOSIS — F419 Anxiety disorder, unspecified: Secondary | ICD-10-CM | POA: Diagnosis not present

## 2018-03-24 DIAGNOSIS — I10 Essential (primary) hypertension: Secondary | ICD-10-CM | POA: Diagnosis not present

## 2018-03-24 DIAGNOSIS — G40909 Epilepsy, unspecified, not intractable, without status epilepticus: Secondary | ICD-10-CM | POA: Diagnosis not present

## 2018-03-24 DIAGNOSIS — E871 Hypo-osmolality and hyponatremia: Secondary | ICD-10-CM | POA: Diagnosis not present

## 2018-03-24 DIAGNOSIS — Z7901 Long term (current) use of anticoagulants: Secondary | ICD-10-CM | POA: Diagnosis not present

## 2018-03-24 DIAGNOSIS — D696 Thrombocytopenia, unspecified: Secondary | ICD-10-CM

## 2018-03-24 DIAGNOSIS — Z79899 Other long term (current) drug therapy: Secondary | ICD-10-CM | POA: Diagnosis not present

## 2018-03-24 DIAGNOSIS — I480 Paroxysmal atrial fibrillation: Secondary | ICD-10-CM

## 2018-03-24 LAB — URINALYSIS, ROUTINE W REFLEX MICROSCOPIC
BILIRUBIN URINE: NEGATIVE
GLUCOSE, UA: NEGATIVE mg/dL
Ketones, ur: NEGATIVE mg/dL
Nitrite: NEGATIVE
Protein, ur: NEGATIVE mg/dL
Specific Gravity, Urine: 1.009 (ref 1.005–1.030)
pH: 6 (ref 5.0–8.0)

## 2018-03-24 LAB — CBC
HEMATOCRIT: 32.9 % — AB (ref 36.0–46.0)
Hemoglobin: 11 g/dL — ABNORMAL LOW (ref 12.0–15.0)
MCH: 34.5 pg — AB (ref 26.0–34.0)
MCHC: 33.4 g/dL (ref 30.0–36.0)
MCV: 103.1 fL — AB (ref 78.0–100.0)
Platelets: 108 10*3/uL — ABNORMAL LOW (ref 150–400)
RBC: 3.19 MIL/uL — ABNORMAL LOW (ref 3.87–5.11)
RDW: 15.9 % — AB (ref 11.5–15.5)
WBC: 5.4 10*3/uL (ref 4.0–10.5)

## 2018-03-24 LAB — BASIC METABOLIC PANEL
ANION GAP: 11 (ref 5–15)
BUN: 15 mg/dL (ref 8–23)
CO2: 18 mmol/L — AB (ref 22–32)
Calcium: 9.2 mg/dL (ref 8.9–10.3)
Chloride: 100 mmol/L (ref 98–111)
Creatinine, Ser: 0.71 mg/dL (ref 0.44–1.00)
GFR calc Af Amer: >60 mL/min (ref >60)
GFR calc non Af Amer: >60 mL/min (ref >60)
GLUCOSE: 80 mg/dL (ref 70–99)
Potassium: 4.7 mmol/L (ref 3.5–5.1)
Sodium: 129 mmol/L — ABNORMAL LOW (ref 135–145)

## 2018-03-24 LAB — TROPONIN I: Troponin I: <0.03 ng/mL (ref <0.03)

## 2018-03-24 LAB — GLUCOSE, CAPILLARY
Glucose-Capillary: 110 mg/dL — ABNORMAL HIGH (ref 70–99)
Glucose-Capillary: 119 mg/dL — ABNORMAL HIGH (ref 70–99)
Glucose-Capillary: 112 mg/dL — ABNORMAL HIGH (ref 70–99)
Glucose-Capillary: 150 mg/dL — ABNORMAL HIGH (ref 70–99)

## 2018-03-24 LAB — ECHOCARDIOGRAM COMPLETE: Weight: 3865.99 oz

## 2018-03-24 MED ORDER — SODIUM CHLORIDE 0.9 % IV SOLN
INTRAVENOUS | Status: DC
  Administered 2018-03-24: via INTRAVENOUS

## 2018-03-24 MED ORDER — SODIUM CHLORIDE 0.9 % IV SOLN
INTRAVENOUS | Status: AC
  Administered 2018-03-24: 20:00:00 via INTRAVENOUS

## 2018-03-24 MED ORDER — IPRATROPIUM-ALBUTEROL 0.5-2.5 (3) MG/3ML IN SOLN
3.0000 mL | Freq: Four times a day (QID) | RESPIRATORY_TRACT | Status: DC | PRN
  Administered 2018-03-24: 3 mL via RESPIRATORY_TRACT
  Filled 2018-03-24: qty 3

## 2018-03-24 NOTE — Care Management Obs Status (Signed)
[facility] NOTIFICATION   Patient Details  Name: [name] MRN: [id number] Date of Birth: 25-Jan-1934   Medicare Observation Status Notification Given:  Yes    Bethena Roys, RN 03/24/2018, 12:07 PM

## 2018-03-24 NOTE — Care Management (Signed)
03-24-18 BENEFITS CHECK :  # 2.   S/W  DAVID  @ [employer]  RX # [phone]   COLCHICINE  0.6 MG DAILY COVER- YES CO-PAY- $ 28.00 TIER- 2 DRUG PRIOR APPROVAL- NO  PREFERRED PHARMACY :  YES [facility]  AND [facility] S[facility] M/O 90 DAY SUPPLY FOR M/O  $ 24.00

## 2018-03-24 NOTE — Progress Notes (Signed)
PROGRESS NOTE   [name]  VOJ:[id number]    DOB: [birth year]    DOA: 03/23/2018  PCP: Rebekah Maize, MD   I have briefly reviewed patients previous medical records in [facility].  Brief Narrative:  84 year old married female, mobile at home with the help of a wheelchair and walker, PMH of anxiety, depression, CVA, GERD, HLD, HTN, hypothyroid, PAF on Xarelto, seizure disorder, remote history of pericarditis, PE status post IVC filter, 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 Curt Paul, Oncology) presented to ED with 3 days history of chest pain.  EKG showed diffuse ST elevation and code STEMI was called.  EKG reviewed by on-call cardiologist and clinical picture and EKG consistent with possible pericarditis.  Recommended holding Xarelto and starting aspirin and colchicine.  Cardiology consulting.   Assessment & Plan:   Principal Problem:   Pericarditis Active Problems:   Hyperlipemia   Essential hypertension   GERD (gastroesophageal reflux disease)   Irritable bowel syndrome   Cerebellar degeneration   Depression   Cerebral infarction ([name])   Seizure disorder ([name])   Palpitation   Paroxysmal atrial fibrillation ([name])   GAD (generalized anxiety disorder)   Pulmonary embolism ([name])   Lung cancer ([name])   Hyponatremia   Thrombocytopenia ([name])   Chest pain consistent with acute pericarditis: Cardiology was consulted.  EKG showed diffuse ST segment elevation.  Troponin cycled x4 and was negative.  Low index of suspicion for PE in the context of anticoagulation and IVC filter.  Empirically started on colchicine and aspirin.  Xarelto was held to rule out pericardial effusion.  Clinically improving.  Remote history of PAF: Has been on chronic anticoagulation with Xarelto which is currently on hold to rule out pericardial  effusion.  Remains on prophylactic dose subcutaneous heparin.  Currently in sinus rhythm.  History of PE status post IVC filter: Also on anticoagulation, discussion as above.  Hyponatremia: Asymptomatic.?  Related to dehydration.  On review of her labs this year, she has had occasional low sodiums in 133-134 range.  She presented with sodium of 126.  Briefly placed on IV saline hydration and this has improved to 129, continue same for additional day and follow-up BMP in a.m.  Metastatic intermediate grade neuroendocrine carcinoma of questionable lung primary and multiple metastatic liver lesions and pancreatic lesions: Outpatient follow-up with oncology.  If effusion present,?  Related to this.  Thrombocytopenia: Possibly due to malignancy and chemotherapy.  No bleeding reported.  Consult oncology.  Platelet transfusion if counts <20,000 or if bleeding.  GERD: PPI.  Essential hypertension: Soft blood pressures.  Currently not on antihypertensives.  Hyperlipidemia: Continue statins  History of seizure disorder: Continue oxcarbazepine  Hypothyroid: Continue Synthroid.  Anxiety and depression:   DVT prophylaxis: Subcutaneous heparin Code Status: Full Family Communication: None at bedside Disposition: DC home pending clinical improvement   Consultants:  Cardiology  Procedures:  None  Antimicrobials:  None   Subjective: She reports feeling better.  Interviewed and examined with her RN at bedside.  Indicates midsternal chest pain worse with deep inspiration.  No dyspnea or cough reported.  No fever or chills.  Does not smoke.  ROS: As above, otherwise negative.  Objective:  Vitals:   03/23/18 2103 03/23/18 2200 03/23/18 2300 03/24/18 0330  BP: (!) 95/44 (!) 102/55 124/63   Pulse: 60     Resp:      Temp:  98.2 F (36.8 C)     TempSrc: Oral     SpO2: 93% 92% 92%   Weight:    109.6 kg    Examination:  General exam: Pleasant elderly female, moderately built and  morbidly obese, lying comfortably supine in bed without distress. Respiratory system: Clear to auscultation. Respiratory effort normal. Cardiovascular system: S1 & S2 heard, RRR. No JVD, murmurs, rubs, gallops or clicks.  Trace ankle edema.  Telemetry personally reviewed: Sinus rhythm with diffuse ST elevation Gastrointestinal system: Abdomen is nondistended, soft and nontender. No organomegaly or masses felt. Normal bowel sounds heard. Central nervous system: Alert and oriented x2. No focal neurological deficits. Extremities: Symmetric 5 x 5 power. Skin: No rashes, lesions or ulcers Psychiatry: Judgement and insight appear impaired. Mood & affect flat.     Data Reviewed: I have personally reviewed following labs and imaging studies  CBC: Recent Labs  Lab 03/23/18 1414 03/24/18 0417  WBC 8.4 5.4  NEUTROABS 6.4  --   HGB 12.2 11.0*  HCT 36.7 32.9*  MCV 103.7* 103.1*  PLT 116* 174*   Basic Metabolic Panel: Recent Labs  Lab 03/23/18 1414 03/23/18 2133 03/24/18 0747  NA 126* 125* 129*  K 4.6 4.2 4.7  CL 94* 95* 100  CO2 21* 22 18*  GLUCOSE 136* 142* 80  BUN 14 15 15   CREATININE 0.95 0.95 0.71  CALCIUM 8.9 9.0 9.2   Cardiac Enzymes: Recent Labs  Lab 03/23/18 1414 03/23/18 1703 03/23/18 2133 03/24/18 0417  TROPONINI <0.03 <0.03 <0.03 <0.03   HbA1C: No results for input(s): HGBA1C in the last 72 hours. CBG: Recent Labs  Lab 03/24/18 0744 03/24/18 1154  GLUCAP 110* 119*    No results found for this or any previous visit (from the past 240 hour(s)).       Radiology Studies: Dg Chest Portable 1 View  Result Date: 03/23/2018 CLINICAL DATA:  Chest pain and shortness of breath EXAM: PORTABLE CHEST 1 VIEW COMPARISON:  Chest CT from 02/05/2018 FINDINGS: The patient is rotated to the left on today's radiograph, reducing diagnostic sensitivity and specificity. Atherosclerotic calcification of the aortic arch. Bandlike scarring or atelectasis in the lingula. Low lung  volumes are present, causing crowding of the pulmonary vasculature. Mild cardiomegaly. Mildly indistinct pulmonary vasculature. No overt airspace edema. Degenerative glenohumeral arthropathy on the left. IMPRESSION: 1. Mild enlargement of the cardiopericardial silhouette, potentially with pulmonary venous hypertension, but without edema. 2.  Aortic Atherosclerosis (ICD10-I70.0). 3. Scarring or atelectasis in the lingula. 4. The patient has known pulmonary nodules related to malignancy which are not readily apparent on today's radiograph. Electronically Signed   By: Van Clines M.D.   On: 03/23/2018 14:31        Scheduled Meds: . amitriptyline  25 mg Oral QHS  . aspirin  325 mg Oral Daily  . colchicine  0.6 mg Oral Daily  . escitalopram  20 mg Oral QHS  . gabapentin  800 mg Oral BID  . heparin  5,000 Units Subcutaneous Q8H  . levothyroxine  75 mcg Oral QAC breakfast  . OXcarbazepine  150 mg Oral BID  . pantoprazole  40 mg Oral Daily  . pravastatin  10 mg Oral q1800   Continuous Infusions: . sodium chloride 50 mL/hr at 03/24/18 1100     LOS: 0 days     Vernell Leep, MD, [place], [facility]. [facility] Pager [phone]  If 7PM-7AM, please contact night-coverage [email or web address] Password [facility] 03/24/2018, 4:42 PM

## 2018-03-24 NOTE — Progress Notes (Signed)
  Echocardiogram 2D Echocardiogram has been performed.  Johny Chess 03/24/2018, 10:49 AM

## 2018-03-24 NOTE — Consult Note (Addendum)
The patient has been seen in conjunction with Vin Bhagat, PAC. All aspects of care have been considered and discussed. The patient has been personally interviewed, examined, and all clinical data has been reviewed.   Symptoms are compatible with pericarditis.  EKG is diagnostic of pericarditis.  Etiology of pericarditis is uncertain.  Possibly viral in origin but difficult to exclude the possibility of malignant infiltration.  She requires anticoagulation therapy for PE and therefore will be at increased risk for hemorrhagic conversion of pericarditis.  Therefore it would be useful to frepeat echo for development of significant effusion development in 1 to 2 weeks  Agree with colchicine therapy for symptomatic relief.   Cardiology Consultation:   Patient ID: [name]; [id number]; 1933-08-23   Admit date: 03/23/2018 Date of Consult: 03/24/2018  Primary Care Provider: Eustaquio Maize, MD Primary Cardiologist: Dr. Gwenlyn Found  Primary Electrophysiologist: Dr. Rayann Heman    Patient Profile:   [name] is a 84 y.o. 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 [name], on chemotherapy), pulmonary embolism status post IVC filter, on Xarelto for chronic anticoagulation, CVA, remote pericarditis about 30 years ago, wheelchair-bound and  who is being seen today for the evaluation of chest pain at the request of Dr. Algis Liming.  She has been evaluated by Dr Rayann Heman for bradycardia but he does not feel this is the cause of her symptoms and she would not benifit from a pacemaker at this time. She does have a loop recorder, placed in 2016. Echo in Sept 2018 showed normal LVF and mild LVH. Myoview in 2015 was low risk, she has no documented CAD.  History of Present Illness:   [name]  is a wheelchair-bound who presented with 3-day history of chest pain.  Patient had intermittent sharp left-sided chest pain with radiation to left neck.  Her pain become constant and associated with shortness of breath leading to presentation by EMS.  EKG showed diffuse ST elevation and code STEMI called.  Her EKG reviewed by on-call cardiologist and canceled.  Recommended treatment with aspirin and colchicine for possible pericarditis.  Held Xarelto.  On subcutaneous heparin for prophylaxis.  Her pain has improved significantly since admission.  Now only present during deep inspiration.  She denies any fever, chills, sick contacts or recent travel.  She does endorse cough and orthopnea.  Pending echocardiogram.  Past Medical History:  Diagnosis Date  . Acute respiratory failure with hypoxia ([place]) 10/23/2015  . Allergic rhinitis    PT. DENIES  . Anxiety   . Arthritis    NECK  . Ataxia   . Bradycardia    primarily nocturnal  . Burning tongue syndrome 25 years  . Cancer ([place]) dx'd 06/2016   liver  . Cataract   . Cerebellar degeneration   . Chronic pericarditis   . Chronic urinary tract infection   . Complication of anesthesia    low o2 sats, coded 30 years ago  . CVA (cerebral infarction) 05/2003  . Depression   . Encounter for antineoplastic chemotherapy 10/09/2016  . Gait disorder   . Gastric polyps   . GERD (gastroesophageal reflux disease)   . Goals of care, counseling/discussion 10/09/2016  . High cholesterol   . Hyperlipidemia   . Hypotension   . Hypothyroidism   . IBS (irritable bowel syndrome)   . Obesity   .  Paroxysmal atrial fibrillation ([name])    chads2vasc score is 6,  she is felt to be a poor candidate for anticoagulation  . Personal history of arterial venous malformation (AVM)    right side of face  . Seizure disorder ([place])   . Seizures ([place]) 2003   " smelling"- Gabapentin "no problem"  . Shortness of breath dyspnea    with exertion  . Sternum fx 10/27/2013  .  Stroke [facility]) 5 years ago   Right side of face weak, slurred speach-   . Thyroid disease   . TIA (transient ischemic attack)   . UTI (lower urinary tract infection) 03/27/2016   "frequently"    Past Surgical History:  Procedure Laterality Date  . APPENDECTOMY  84 years old  . COLONOSCOPY  2006, 2009  . COLONOSCOPY WITH PROPOFOL N/A 03/28/2016   Procedure: COLONOSCOPY WITH PROPOFOL;  Surgeon: Gatha Mayer, MD;  Location: [place];  Service: Endoscopy;  Laterality: N/A;  . cyst removed  35 years ago  . EP IMPLANTABLE DEVICE N/A 01/06/2015   Procedure: Loop Recorder Insertion;  Surgeon: Thompson Grayer, MD;  Location: [place] [facility];  Service: Cardiovascular;  Laterality: N/A;  . EYE SURGERY Right    Cataract  . IR ANGIOGRAM SELECTIVE EACH ADDITIONAL VESSEL  11/28/2016  . IR ANGIOGRAM SELECTIVE EACH ADDITIONAL VESSEL  11/28/2016  . IR ANGIOGRAM SELECTIVE EACH ADDITIONAL VESSEL  11/28/2016  . IR ANGIOGRAM SELECTIVE EACH ADDITIONAL VESSEL  11/28/2016  . IR ANGIOGRAM SELECTIVE EACH ADDITIONAL VESSEL  12/13/2016  . IR ANGIOGRAM SELECTIVE EACH ADDITIONAL VESSEL  12/13/2016  . IR ANGIOGRAM SELECTIVE EACH ADDITIONAL VESSEL  01/09/2017  . IR ANGIOGRAM VISCERAL SELECTIVE  11/28/2016  . IR ANGIOGRAM VISCERAL SELECTIVE  11/28/2016  . IR ANGIOGRAM VISCERAL SELECTIVE  12/13/2016  . IR ANGIOGRAM VISCERAL SELECTIVE  12/13/2016  . IR ANGIOGRAM VISCERAL SELECTIVE  01/09/2017  . IR EMBO ARTERIAL NOT HEMORR HEMANG INC GUIDE ROADMAPPING  11/28/2016  . IR EMBO TUMOR ORGAN ISCHEMIA INFARCT INC GUIDE ROADMAPPING  12/13/2016  . IR EMBO TUMOR ORGAN ISCHEMIA INFARCT INC GUIDE ROADMAPPING  01/09/2017  . IR IVC FILTER PLMT / S&I /IMG GUID/MOD SED  04/24/2017  . IR RADIOLOGIST EVAL & MGMT  11/06/2016  . IR RADIOLOGIST EVAL & MGMT  01/02/2017  . IR RADIOLOGIST EVAL & MGMT  02/05/2017  . IR RADIOLOGIST EVAL & MGMT  05/02/2017  . IR RADIOLOGIST EVAL & MGMT  10/17/2017  . IR US GUIDE VASC ACCESS RIGHT  11/28/2016  . IR US GUIDE VASC  ACCESS RIGHT  12/13/2016  . IR US GUIDE VASC ACCESS RIGHT  01/09/2017  . KNEE ARTHROSCOPY Right 11/14/2006  . KNEE ARTHROSCOPY Bilateral 5 and 6 years ago  . KNEE ARTHROSCOPY WITH LATERAL MENISECTOMY  07/03/2012   Procedure: KNEE ARTHROSCOPY WITH LATERAL MENISECTOMY;  Surgeon: Magnus Sinning, MD;  Location: [facility];  Service: Orthopedics;  Laterality: Left;  with Partial Lateral Menisectomy and Medial Menisectomy. Shaving of medial and lateral femoral condyles. Shaving of patella. Removal of a loose body  . tibial and fibular internal fixation Left   . TOTAL ABDOMINAL HYSTERECTOMY  84 years old  . UPPER GASTROINTESTINAL ENDOSCOPY  2009, 2013     Inpatient Medications: Scheduled Meds: . amitriptyline  25 mg Oral QHS  . aspirin  325 mg Oral Daily  . colchicine  0.6 mg Oral Daily  . escitalopram  20 mg Oral QHS  . gabapentin  800 mg Oral BID  . heparin  5,000  Units Subcutaneous Q8H  . levothyroxine  75 mcg Oral QAC breakfast  . OXcarbazepine  150 mg Oral BID  . pantoprazole  40 mg Oral Daily  . pravastatin  10 mg Oral q1800   Continuous Infusions: . sodium chloride 50 mL/hr at 03/24/18 0400   PRN Meds: acetaminophen **OR** acetaminophen, bisacodyl, HYDROcodone-acetaminophen, hydroxypropyl methylcellulose / hypromellose, morphine injection, nitroGLYCERIN, ondansetron **OR** ondansetron (ZOFRAN) IV, senna-docusate  Allergies:    Allergies  Allergen Reactions  . Lisinopril Cough    Social History:   Social History   Socioeconomic History  . Marital status: Married    Spouse name: Not on file  . Number of children: 6  . Years of education: Not on file  . Highest education level: 10th grade  Occupational History  . Occupation: Agricultural engineer  Social Needs  . Financial resource strain: Not hard at all  . Food insecurity:    Worry: Never true    Inability: Never true  . Transportation needs:    Medical: No    Non-medical: No  Tobacco Use  . Smoking status: Former Smoker     Packs/day: 0.25    Years: 10.00    Pack years: 2.50    Types: Cigarettes    Last attempt to quit: 07/31/1975    Years since quitting: 42.6  . Smokeless tobacco: Never Used  Substance and Sexual Activity  . Alcohol use: No    Comment: Rare- maybe a drink ever 2 years  . Drug use: No  . Sexual activity: Not Currently  Lifestyle  . Physical activity:    Days per week: 0 days    Minutes per session: 0 min  . Stress: Not at all  Relationships  . Social connections:    Talks on phone: More than three times a week    Gets together: More than three times a week    Attends religious service: Never    Active member of club or organization: No    Attends meetings of clubs or organizations: Never    Relationship status: Married  . Intimate partner violence:    Fear of current or ex partner: No    Emotionally abused: No    Physically abused: No    Forced sexual activity: No  Other Topics Concern  . Not on file  Social History Narrative   Lives with husband, does have stairs, does not use them. Pt completed 10th grade.    Family History:   Family History  Problem Relation Age of Onset  . Heart attack Father 87       fatal  . Coronary artery disease Brother   . Diabetes Brother   . Prostate cancer Brother   . Prostate cancer Son      ROS:  Please see the history of present illness.  All other ROS reviewed and negative.     Physical Exam/Data:   Vitals:   03/23/18 2103 03/23/18 2200 03/23/18 2300 03/24/18 0330  BP: (!) 95/44 (!) 102/55 124/63   Pulse: 60     Resp:      Temp: 98.2 F (36.8 C)     TempSrc: Oral     SpO2: 93% 92% 92%   Weight:    109.6 kg    Intake/Output Summary (Last 24 hours) at 03/24/2018 0800 Last data filed at 03/24/2018 0400 Gross per 24 hour  Intake 437.52 ml  Output 2400 ml  Net -1962.48 ml   Filed Weights   03/24/18 0330  Weight: 109.6 kg  Body mass index is 41.47 kg/m.  General: Ill-appearing obese female in no acute distress who  laying comfortably  HEENT: normal Lymph: no adenopathy Neck: no JVD Endocrine:  No thryomegaly Vascular: No carotid bruits; FA pulses 2+ bilaterally without bruits  Cardiac:  normal S1, S2; RRR; no murmur Lungs:  clear to auscultation bilaterally, no wheezing, rhonchi or rales  Abd: soft, nontender, no hepatomegaly  Ext: no edema Musculoskeletal:  No deformities, BUE and BLE strength normal and equal Skin: warm and dry  Neuro:  CNs 2-12 intact, no focal abnormalities noted Psych:  Normal affect   EKG:  The EKG was personally reviewed and demonstrates: Sinus rhythm with diffuse ST elevation Telemetry:  Telemetry was personally reviewed and demonstrates: Sinus rhythm  Relevant CV Studies:  Echo 03/2017 Study Conclusions  - Left ventricle: The cavity size was normal. Wall thickness was   increased in a pattern of mild LVH. Systolic function was normal.   The estimated ejection fraction was in the range of 60% to 65%.   Left ventricular diastolic function parameters were normal. - Aortic valve: There was mild regurgitation. - Impressions: Shadowing in the aortic arch on suprasternal views   but likely atherosclerotic disease  Impressions:  - Shadowing in the aortic arch on suprasternal views but likely   atherosclerotic disease  Laboratory Data:  Chemistry Recent Labs  Lab 03/23/18 1414 03/23/18 2133  NA 126* 125*  K 4.6 4.2  CL 94* 95*  CO2 21* 22  GLUCOSE 136* 142*  BUN 14 15  CREATININE 0.95 0.95  CALCIUM 8.9 9.0  GFRNONAA 55* 55*  GFRAA >60 >60  ANIONGAP 11 8   Hematology Recent Labs  Lab 03/23/18 1414 03/24/18 0417  WBC 8.4 5.4  RBC 3.54* 3.19*  HGB 12.2 11.0*  HCT 36.7 32.9*  MCV 103.7* 103.1*  MCH 34.5* 34.5*  MCHC 33.2 33.4  RDW 15.9* 15.9*  PLT 116* 108*   Cardiac Enzymes Recent Labs  Lab 03/23/18 1414 03/23/18 1703 03/23/18 2133 03/24/18 0417  TROPONINI <0.03 <0.03 <0.03 <0.03    Recent Labs  Lab 03/23/18 1424  TROPIPOC 0.00     DDimer  Recent Labs  Lab 03/23/18 1414  DDIMER 0.41    Radiology/Studies:  Dg Chest Portable 1 View  Result Date: 03/23/2018 CLINICAL DATA:  Chest pain and shortness of breath EXAM: PORTABLE CHEST 1 VIEW COMPARISON:  Chest CT from 02/05/2018 FINDINGS: The patient is rotated to the left on today's radiograph, reducing diagnostic sensitivity and specificity. Atherosclerotic calcification of the aortic arch. Bandlike scarring or atelectasis in the lingula. Low lung volumes are present, causing crowding of the pulmonary vasculature. Mild cardiomegaly. Mildly indistinct pulmonary vasculature. No overt airspace edema. Degenerative glenohumeral arthropathy on the left. IMPRESSION: 1. Mild enlargement of the cardiopericardial silhouette, potentially with pulmonary venous hypertension, but without edema. 2.  Aortic Atherosclerosis (ICD10-I70.0). 3. Scarring or atelectasis in the lingula. 4. The patient has known pulmonary nodules related to malignancy which are not readily apparent on today's radiograph. Electronically Signed   By: Van Clines M.D.   On: 03/23/2018 14:31    Assessment and Plan:   1. Acute pericarditis Presenting with 3 days history of sharp chest pain.  EKG with diffuse ST elevation.  Troponin has been negative.  Her pain has improved significantly on colchicine and aspirin.  Now only present on deep inspiration.  Her Xarelto has hold to rule out effusion.  Pending echocardiogram.  2.  Metastatic lung cancer -On chemotherapy.  Followed by  Dr. [name]   3. Hx of remote Afib -Chronic anticoagulation with Xarelto.  Hold currently to rule out effusion. - On Brandt heparin  4. Hx of PE s/p IVC filter - On Xarelto  5. Chronic hyponatremia - Per primary team   For questions or updates, please contact [place] Please consult [email or web address] for contact info under Cardiology/STEMI.   Jarrett Soho, PA  03/24/2018 8:00 AM

## 2018-03-24 NOTE — Progress Notes (Signed)
Bladder scanned patient, showed 825 to 815mls. Text paged MD for an in and out catheter order. Explained procedure to patient and used sterile procedure with 2 RNs as per policy. End results was 1280mls. Patient tolerated procedure well. Will continue to monitor.

## 2018-03-24 NOTE — Care Management Note (Addendum)
Case Management Note  Patient Details  Name: [name] MRN: [id number] Date of Birth: September 08, 1933  Subjective/Objective: Pt presented for Chest Pain and Difficulty Breathing. PTA from home with support of husband. Per patient she is wheelchair bound at home. Patient is on 02 in the room will assess to see if patient needs 02 for home. No home needs identified at this time.                Action/Plan: CM did benefits check for colchicine and patient is aware of cost. No further needs from CM at this time.   Expected Discharge Date:                  Expected Discharge Plan:  Home/Self Care  In-House Referral:  NA  Discharge planning Services  CM Consult, Medication Assistance  Post Acute Care Choice:  Home Health Choice offered to: Patient, Spouse  DME Arranged:  N/A DME Agency:  NA  HH Arranged: RN [place] Agency:  [place].  Status of Service:  Completed, signed off  If discussed at [place] of Stay Meetings, dates discussed:    Additional Comments: 1157 03-26-18 Jacqlyn Krauss, RN, BSN [phone] CM did speak with patient in regards to disposition needs. Patient will need [facility] RN for foley catheter cath. RN will demonstrate to family in regards to care. Referral made to [facility] with [facility] to begin within 24-48 hours post transition home.  Bethena Roys, RN 03/24/2018, 12:12 PM

## 2018-03-24 NOTE — Progress Notes (Signed)
Text paged cards re: pt crying in pain from her bladder area. She has an external catheter with some urine in it but was found to be wet on her bedpad D/T stress incontinence with coughing spells, bladder scan shows from [id number], will probably need to straight cath her again as was done early this AM. Pt also having difficulty breathing with INS/EXP wheezing, will get an aerosol tx ordered and continue to monitor.

## 2018-03-25 DIAGNOSIS — Z7901 Long term (current) use of anticoagulants: Secondary | ICD-10-CM | POA: Diagnosis not present

## 2018-03-25 DIAGNOSIS — I10 Essential (primary) hypertension: Secondary | ICD-10-CM | POA: Diagnosis not present

## 2018-03-25 DIAGNOSIS — G40909 Epilepsy, unspecified, not intractable, without status epilepticus: Secondary | ICD-10-CM | POA: Diagnosis not present

## 2018-03-25 DIAGNOSIS — Z8673 Personal history of transient ischemic attack (TIA), and cerebral infarction without residual deficits: Secondary | ICD-10-CM | POA: Diagnosis not present

## 2018-03-25 DIAGNOSIS — R079 Chest pain, unspecified: Secondary | ICD-10-CM | POA: Diagnosis not present

## 2018-03-25 DIAGNOSIS — D696 Thrombocytopenia, unspecified: Secondary | ICD-10-CM | POA: Diagnosis not present

## 2018-03-25 DIAGNOSIS — I301 Infective pericarditis: Secondary | ICD-10-CM

## 2018-03-25 DIAGNOSIS — D539 Nutritional anemia, unspecified: Secondary | ICD-10-CM | POA: Diagnosis not present

## 2018-03-25 DIAGNOSIS — F419 Anxiety disorder, unspecified: Secondary | ICD-10-CM | POA: Diagnosis not present

## 2018-03-25 DIAGNOSIS — C3490 Malignant neoplasm of unspecified part of unspecified bronchus or lung: Secondary | ICD-10-CM | POA: Diagnosis not present

## 2018-03-25 DIAGNOSIS — G119 Hereditary ataxia, unspecified: Secondary | ICD-10-CM | POA: Diagnosis not present

## 2018-03-25 DIAGNOSIS — I309 Acute pericarditis, unspecified: Secondary | ICD-10-CM | POA: Diagnosis not present

## 2018-03-25 DIAGNOSIS — I480 Paroxysmal atrial fibrillation: Secondary | ICD-10-CM | POA: Diagnosis not present

## 2018-03-25 DIAGNOSIS — Z79899 Other long term (current) drug therapy: Secondary | ICD-10-CM | POA: Diagnosis not present

## 2018-03-25 LAB — GLUCOSE, CAPILLARY
GLUCOSE-CAPILLARY: 115 mg/dL — AB (ref 70–99)
Glucose-Capillary: 161 mg/dL — ABNORMAL HIGH (ref 70–99)
Glucose-Capillary: 167 mg/dL — ABNORMAL HIGH (ref 70–99)
Glucose-Capillary: 115 mg/dL — ABNORMAL HIGH (ref 70–99)

## 2018-03-25 LAB — CBC
HCT: 32.1 % — ABNORMAL LOW (ref 36.0–46.0)
Hemoglobin: 10.9 g/dL — ABNORMAL LOW (ref 12.0–15.0)
MCH: 34.8 pg — ABNORMAL HIGH (ref 26.0–34.0)
MCHC: 34 g/dL (ref 30.0–36.0)
MCV: 102.6 fL — ABNORMAL HIGH (ref 78.0–100.0)
Platelets: 114 10*3/uL — ABNORMAL LOW (ref 150–400)
RBC: 3.13 MIL/uL — ABNORMAL LOW (ref 3.87–5.11)
RDW: 15.9 % — AB (ref 11.5–15.5)
WBC: 4.8 10*3/uL (ref 4.0–10.5)

## 2018-03-25 LAB — BASIC METABOLIC PANEL
Anion gap: 7 (ref 5–15)
BUN: 13 mg/dL (ref 8–23)
CALCIUM: 9.1 mg/dL (ref 8.9–10.3)
CO2: 26 mmol/L (ref 22–32)
CREATININE: 0.86 mg/dL (ref 0.44–1.00)
Chloride: 101 mmol/L (ref 98–111)
GFR calc Af Amer: >60 mL/min (ref >60)
GFR calc non Af Amer: >60 mL/min (ref >60)
Glucose, Bld: 134 mg/dL — ABNORMAL HIGH (ref 70–99)
Potassium: 4.1 mmol/L (ref 3.5–5.1)
SODIUM: 134 mmol/L — AB (ref 135–145)

## 2018-03-25 MED ORDER — COLCHICINE 0.6 MG PO TABS: 0.6000 mg | ORAL_TABLET | Freq: Every day | ORAL | 0 refills | Status: DC

## 2018-03-25 MED ORDER — RIVAROXABAN 20 MG PO TABS: 20.0000 mg | ORAL_TABLET | Freq: Every day | ORAL | Status: DC

## 2018-03-25 NOTE — Progress Notes (Addendum)
The patient has been seen in conjunction with Vin Bhagat, PAC. All aspects of care have been considered and discussed. The patient has been personally interviewed, examined, and all clinical data has been reviewed.   Acute pericarditis with diagnostic EKG and now response to anti-inflammatory therapy.  Etiology of pericarditis is unclear.  Likely viral in etiology.  Cannot exclude metastatic infiltration.  Continue colchicine for at least 4 weeks to prevent rebound.  Decision concerning anticoagulation therapy needs to be made.  In the presence of pericarditis there is a risk of hemorrhagic conversion and pericardial effusion development.  No other specific management recommendations at this time.  If safe, avoid anticoagulation for 3 to 4 weeks,  Progress Note  Patient Name: [name] Date of Encounter: 03/25/2018   Primary Cardiologist: Dr. Gwenlyn Found  Primary Electrophysiologist: Dr. Rayann Heman   Subjective   Symptoms improving.  Continues to have intermittent cough.  Feeling much better.  Inpatient Medications    Scheduled Meds: . amitriptyline  25 mg Oral QHS  . aspirin  325 mg Oral Daily  . colchicine  0.6 mg Oral Daily  . escitalopram  20 mg Oral QHS  . gabapentin  800 mg Oral BID  . heparin  5,000 Units Subcutaneous Q8H  . levothyroxine  75 mcg Oral QAC breakfast  . OXcarbazepine  150 mg Oral BID  . pantoprazole  40 mg Oral Daily  . pravastatin  10 mg Oral q1800   Continuous Infusions: . sodium chloride 50 mL/hr at 03/25/18 0600   PRN Meds: acetaminophen **OR** acetaminophen, bisacodyl, HYDROcodone-acetaminophen, hydroxypropyl methylcellulose / hypromellose, ipratropium-albuterol, morphine injection, nitroGLYCERIN, ondansetron **OR** ondansetron (ZOFRAN) IV, senna-docusate   Vital Signs    Vitals:   03/25/18 0300 03/25/18 0500 03/25/18 0600 03/25/18 0623  BP: (!) 115/53 107/60    Pulse:    82  Resp: 18 15  16   Temp:    98.1 F (36.7 C)  TempSrc:    Oral    SpO2: 96% 96%    Weight:   106.6 kg     Intake/Output Summary (Last 24 hours) at 03/25/2018 0848 Last data filed at 03/25/2018 0600 Gross per 24 hour  Intake 958.57 ml  Output 5025 ml  Net -4066.43 ml   Filed Weights   03/24/18 0330 03/25/18 0600  Weight: 109.6 kg 106.6 kg    Telemetry    Sinus rhythm with intermittent pauses, longest 2.04 seconds.- Personally Reviewed  ECG    N/A  Physical Exam   GEN:  Obese ill-appearing female in no acute distress.   Neck: No JVD Cardiac: RRR, no murmurs, rubs, or gallops.  Respiratory: Clear to auscultation bilaterally. GI: Soft, nontender, non-distended  MS:  Trace bilateral lower extremity edema; No deformity. Neuro:  Nonfocal  Psych: Normal affect   Labs    Chemistry Recent Labs  Lab 03/23/18 2133 03/24/18 0747 03/25/18 0339  NA 125* 129* 134*  K 4.2 4.7 4.1  CL 95* 100 101  CO2 22 18* 26  GLUCOSE 142* 80 134*  BUN 15 15 13   CREATININE 0.95 0.71 0.86  CALCIUM 9.0 9.2 9.1  GFRNONAA 55* >60 >60  GFRAA >60 >60 >60  ANIONGAP 8 11 7      Hematology Recent Labs  Lab 03/23/18 1414 03/24/18 0417 03/25/18 0339  WBC 8.4 5.4 4.8  RBC 3.54* 3.19* 3.13*  HGB 12.2 11.0* 10.9*  HCT 36.7 32.9* 32.1*  MCV 103.7* 103.1* 102.6*  MCH 34.5* 34.5* 34.8*  MCHC 33.2 33.4 34.0  RDW  15.9* 15.9* 15.9*  PLT 116* 108* 114*    Cardiac Enzymes Recent Labs  Lab 03/23/18 1414 03/23/18 1703 03/23/18 2133 03/24/18 0417  TROPONINI <0.03 <0.03 <0.03 <0.03    Recent Labs  Lab 03/23/18 1424  TROPIPOC 0.00     DDimer  Recent Labs  Lab 03/23/18 1414  DDIMER 0.41     Radiology    Dg Chest Portable 1 View  Result Date: 03/23/2018 CLINICAL DATA:  Chest pain and shortness of breath EXAM: PORTABLE CHEST 1 VIEW COMPARISON:  Chest CT from 02/05/2018 FINDINGS: The patient is rotated to the left on today's radiograph, reducing diagnostic sensitivity and specificity. Atherosclerotic calcification of the aortic arch. Bandlike  scarring or atelectasis in the lingula. Low lung volumes are present, causing crowding of the pulmonary vasculature. Mild cardiomegaly. Mildly indistinct pulmonary vasculature. No overt airspace edema. Degenerative glenohumeral arthropathy on the left. IMPRESSION: 1. Mild enlargement of the cardiopericardial silhouette, potentially with pulmonary venous hypertension, but without edema. 2.  Aortic Atherosclerosis (ICD10-I70.0). 3. Scarring or atelectasis in the lingula. 4. The patient has known pulmonary nodules related to malignancy which are not readily apparent on today's radiograph. Electronically Signed   By: Van Clines M.D.   On: 03/23/2018 14:31    Cardiac Studies   Echo 03/24/18 Study Conclusions  - Left ventricle: The cavity size was normal. Wall thickness was   increased in a pattern of mild LVH. Systolic function was   vigorous. The estimated ejection fraction was in the range of 65%   to 70%. Wall motion was normal; there were no regional wall   motion abnormalities. Doppler parameters are consistent with   abnormal left ventricular relaxation (grade 1 diastolic   dysfunction). The E/e&' ratio is between 8-15, suggesting   indeterminate LV filling pressure. - Aortic valve: Sclerosis without stenosis. There was mild   regurgitation. - Aorta: Ascending aortic diameter: 39 mm (S). - Ascending aorta: The ascending aorta is dilated. - Mitral valve: Mildly thickened leaflets . There was trivial   regurgitation. - Left atrium: The atrium was normal in size. - Tricuspid valve: There was trivial regurgitation. - Pulmonic valve: There was mild regurgitation. - Pulmonary arteries: PA peak pressure: 34 mm Hg (S). - Inferior vena cava: The vessel was normal in size. The   respirophasic diameter changes were in the normal range (= 50%),   consistent with normal central venous pressure. - Pericardium, extracardiac: The pericardium was normal in   appearance. There was no pericardial  effusion.  Impressions:  - Compared to an echo in 2018, the LVEF is higher at 65-70%. Grade   1 DD is noted. The ascending aorta is dilated to 3.9 cm with mild   AI. No pericardial thickening or effusion is noted.   Patient Profile     84 y.o. 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 [name], on chemotherapy), pulmonary embolism status post IVC filter, on Xarelto for chronic anticoagulation, CVA, remote pericarditis about 30 years ago and wheelchair-bound presented for chest pain evaluation.   EKG showed diffuse ST elevation and code STEMI called.  Her EKG reviewed by on-call cardiologist and canceled.   Assessment & Plan    1. Acute pericarditis -Sometimes improving on aspirin and colchicine.  Cardiogram showed normal LV function without pericardial effusion.  Continues to have cough.  2.  remote history of  atrial fibrillation and PE s/p IVC filter -Maintaining sinus rhythm with intermittent pause on telemetry.  Xarelto on hold >>resumption per MD  3.  Mild lower extremity edema -She takes Lasix 20 mg as needed for edema.  Consider one dose.   4.  Metastatic lung cancer  For questions or updates, please contact [place] Please consult [email or web address] for contact info under Cardiology/STEMI.      Jarrett Soho, PA  03/25/2018, 8:48 AM

## 2018-03-25 NOTE — Plan of Care (Signed)
Patient states that after giving birth to her last child she had a hysterectomy and has had problems with urinary retention and UTI's. Pt was bladder scanned 8/26 early am and attempted to sit her on the [facility], she voids small amounts but has starts voiding if she coughs and with stress, had to straight cath her and got 1200cc of urine out. Today, at the beginning of the shift, pt started C/O feeling full in her bladder, scan showed 997cc of urine, got an order to straight cath her again and got 1500cc of urine. I was making rounds around 12:30 AM and found the pt in tears because she needing to void, purewich in and drained some urine but again her bladder scan was 850-900, text paged and got an order to place a foley at this time for acute urinary retention and got almost 1,000cc of clear almost water colored urine and patient tolerated well, will continue to monitor.

## 2018-03-25 NOTE — Progress Notes (Signed)
Spoke with patient and husband regarding discharge. Husband concerned about retention issues as patient has had voiding problems in the past and had to be readmitted after discharge. Discharge canceled per MD [place]. Husband and patient updated.   Saddie Benders RN

## 2018-03-25 NOTE — Evaluation (Signed)
Physical Therapy Evaluation Patient Details Name: [name] MRN: [id number] DOB: [birth year] Today's Date: 03/25/2018   History of Present Illness  Pt adm with pericarditis. Pt with metastatic lung CA and undergoing chemotherapy treatment. PMH - cerebellar degeneration, HTN, CVA, ataxia,   Clinical Impression  Pt admitted with above diagnosis and presents to PT with functional limitations due to deficits listed below (See PT problem list). Pt needs skilled PT to maximize independence and safety to allow discharge to home with husband. Expect pt will progress back to baseline with mobility fairly quickly and pt reports husband can assist her as needed until then.      Follow Up Recommendations No PT follow up    Equipment Recommendations  None recommended by PT    Recommendations for Other Services       Precautions / Restrictions Precautions Precautions: Fall Restrictions Weight Bearing Restrictions: No      Mobility  Bed Mobility Overal bed mobility: Needs Assistance Bed Mobility: Supine to Sit     Supine to sit: Min assist     General bed mobility comments: assist to elevate trunk into sitting. Incr time to perform  Transfers Overall transfer level: Needs assistance Equipment used: 2 person hand held assist Transfers: Sit to/from [employer] Transfers Sit to Stand: +2 physical assistance;Min assist Stand pivot transfers: +2 physical assistance;Min assist       General transfer comment: Assist to bring hips up and for balance  Ambulation/Gait             General Gait Details: Pt non ambulatory at baseline  Stairs            Wheelchair Mobility    Modified Rankin (Stroke Patients Only)       Balance Overall balance assessment: Needs assistance Sitting-balance support: No upper extremity supported;Feet supported Sitting balance-Leahy Scale: Good     Standing balance support: Bilateral upper extremity supported Standing  balance-Leahy Scale: Poor Standing balance comment: +2 hand held assist                              Pertinent Vitals/Pain Pain Assessment: No/denies pain    Home Living Family/patient expects to be discharged to:: Private residence Living Arrangements: Spouse/significant other Available Help at Discharge: Friend(s);Available 24 hours/day Type of Home: House Home Access: Ramped entrance     Home Layout: Multi-level;Able to live on main level with bedroom/bathroom Home Equipment: Gilford Rile - 2 wheels;Walker - 4 wheels;Grab bars - tub/shower;Shower seat;Wheelchair - manual      Prior Function Level of Independence: Needs assistance   Gait / Transfers Assistance Needed: Pt independent with w/c transfers           Hand Dominance   Dominant Hand: Right    Extremity/Trunk Assessment   Upper Extremity Assessment Upper Extremity Assessment: Generalized weakness    Lower Extremity Assessment Lower Extremity Assessment: Generalized weakness       Communication   Communication: No difficulties  Cognition Arousal/Alertness: Awake/alert Behavior During Therapy: WFL for tasks assessed/performed Overall Cognitive Status: Within Functional Limits for tasks assessed                                        General Comments      Exercises     Assessment/Plan    PT Assessment Patient needs continued PT services  PT  Problem List Decreased strength;Decreased balance;Decreased mobility;Obesity       PT Treatment Interventions Functional mobility training;Therapeutic activities;Therapeutic exercise;Balance training;Patient/family education    PT Goals (Current goals can be found in the Care Plan section)  Acute Rehab PT Goals Patient Stated Goal: return home PT Goal Formulation: With patient Time For Goal Achievement: 04/08/18 Potential to Achieve Goals: Good    Frequency Min 3X/week   Barriers to discharge        Co-evaluation                AM-PAC PT "6 Clicks" Daily Activity  Outcome Measure Difficulty turning over in bed (including adjusting bedclothes, sheets and blankets)?: A Little Difficulty moving from lying on back to sitting on the side of the bed? : Unable Difficulty sitting down on and standing up from a chair with arms (e.g., wheelchair, bedside commode, etc,.)?: Unable Help needed moving to and from a bed to chair (including a wheelchair)?: A Little Help needed walking in hospital room?: Total Help needed climbing 3-5 steps with a railing? : Total 6 Click Score: 10    End of Session Equipment Utilized During Treatment: Gait belt Activity Tolerance: Patient tolerated treatment well Patient left: in chair;with call bell/phone within reach;with chair alarm set Nurse Communication: Mobility status PT Visit Diagnosis: Unsteadiness on feet (R26.81);Other abnormalities of gait and mobility (R26.89)    Time: 5075-7322 PT Time Calculation (min) (ACUTE ONLY): 15 min   Charges:   PT Evaluation $PT Eval Low Complexity: [place] Zahir Eisenhour PT E[place] 03/25/2018, 10:52 AM

## 2018-03-25 NOTE — Progress Notes (Signed)
The patients Foley was removed ~1530 prior to discharge. Bladder scan showed ~147ml at 1830. Spoke with Dr. Jonnie Finner okay to proceed with discharge.   Saddie Benders RN

## 2018-03-25 NOTE — Discharge Summary (Signed)
Physician Discharge Summary  Patient ID: [name] MRN: [id number] DOB/AGE: 09-02-1933 84 y.o.  Admit date: 03/23/2018 Discharge date: 03/25/2018  Admission Diagnoses:   Chest Pain    Acute Pericarditis   Essential hypertension   Cerebellar degeneration   Depression   Cerebral infarction [facility])   Seizure disorder ([name])   Palpitation   Paroxysmal atrial fibrillation ([name])   GAD (generalized anxiety disorder)   History of Pulmonary embolism ([name])   Neuroendocrine Lung cancer metastatic ([place])   Dehydration with hyponatremia   Thrombocytopenia ([name])   Macrocytic anemia  Discharge Diagnoses:    Acute Pericarditis   Neuroendocrine Lung cancer metastatic ([place])   Chest Pain   Active cancer therapy   Essential hypertension   Cerebellar degeneration   Depression   Cerebral infarction ([name])   Seizure disorder ([name])   Palpitation   Paroxysmal atrial fibrillation ([name])   GAD (generalized anxiety disorder)   History of Pulmonary embolism ([name])   Thrombocytopenia ([name])   Macrocytic anemia   Discharged Condition: good  Presentation: 84 year old married female, mobile at home with the help of a wheelchair and walker, PMH of anxiety, depression, CVA, GERD, HLD, HTN, hypothyroid, PAF on Xarelto, seizure disorder, remote history of pericarditis, PE status post IVC filter, 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 Curt Bears, Oncology) presented to ED with 3 days history of chest pain.  EKG showed diffuse ST elevation and code STEMI was called.  EKG reviewed by on-call cardiologist and clinical picture and EKG consistent with possible pericarditis.  Recommended holding Xarelto and starting aspirin and colchicine.  Cardiology consulting.    Hospital Course:   Acute pericarditis/ chest pain: EKG showed diffuse ST segment elevation.   Troponin cycled x4 and was negative.  Low index of suspicion for PE in the context of anticoagulation and IVC filter. Cardiology was consulted and started empirically on colchicine.  Cardiology felt etiology of pericarditis most likely viral, but can't r/o malignant.  No effusion on echo.  Cardiology recommendation for 2 wks colchicine and hold Xarelto 3-4 wks if possible.  Plan is for  >> - colchicine for 2 wk course - spoke w/ pt's oncologist Dr [name], we will dc Xarelto for 2 wks from date of dc then resume at same dose - F/U PCP as needed   History of PE status post IVC filter: on Xarelto, currently on hold for #1, pericarditis. See above.  Restart in 2 weeks.   Metastatic intermediate grade neuroendocrine carcinoma of questionable lung primary w/ multiple metastatic liver lesions and pancreatic lesions: Outpatient follow-up with oncology. Declined aggressive chemoRx at time of diagnosis in Feb 2018, has been taking Xeloda and Temodar since then, is on cycle #21 approx. Last saw oncology 2 wks ago.  Also has had radio embolization of liver lesions by IR.   Thrombocytopenia: Possibly due to malignancy and chemotherapy.  No bleeding reported.  GERD: PPI.  Essential hypertension: Soft blood pressures.  Currently not on antihypertensives.  Hyperlipidemia: Continue statins  History of seizure disorder: Continue oxcarbazepine  Hypothyroid: Continue Synthroid.  Anxiety and depression: stable   Consultants:  Cardiology  Procedures:  None  Antimicrobials:  None  Discharge Exam: Blood pressure (!) 125/55, pulse 74, temperature 97.7 F (36.5 C), temperature source Oral, resp. rate 14, weight 106.6 kg, SpO2 98 %. General exam: Pleasant elderly female, moderately built and morbidly obese, lying comfortably supine in bed without  distress. Respiratory system: Clear to auscultation. Respiratory effort normal. Cardiovascular system: S1 & S2 heard, RRR. No JVD, murmurs, rubs,  gallops or clicks.  Trace ankle edema.  Telemetry personally reviewed: Sinus rhythm with diffuse ST elevation Gastrointestinal system: Abdomen is nondistended, soft and nontender. No organomegaly or masses felt. Normal bowel sounds heard. Central nervous system: Alert and oriented x2. No focal neurological deficits. Extremities: Symmetric 5 x 5 power. Skin: No rashes, lesions or ulcers Psychiatry: Judgement and insight appear impaired. Mood   Disposition: Discharge disposition: 01-Home or Self Care        Allergies as of 03/25/2018      Reactions   Lisinopril Cough      Medication List    TAKE these medications   amitriptyline 25 MG tablet Commonly known as:  ELAVIL Take 1 tablet (25 mg total) by mouth at bedtime.   capecitabine 500 MG tablet Commonly known as:  XELODA TAKE 3 TABLETS (1,500 MG TOTAL) BY MOUTH 2 (TWO) TIMES DAILY AFTER A MEAL. What changed:    how much to take  how to take this  when to take this  additional instructions   colchicine 0.6 MG tablet Take 1 tablet (0.6 mg total) by mouth daily. Start taking on:  03/26/2018   escitalopram 20 MG tablet Commonly known as:  LEXAPRO Take 1 tablet (20 mg total) by mouth at bedtime.   fluticasone 50 MCG/ACT nasal spray Commonly known as:  FLONASE Place 2 sprays into both nostrils at bedtime.   furosemide 20 MG tablet Commonly known as:  LASIX TAKE 1 TABLET DAILY AS NEEDED FOR FLUID RETENTION What changed:    how much to take  how to take this  when to take this  reasons to take this   gabapentin 400 MG capsule Commonly known as:  NEURONTIN Take 1-2 capsules (400-800 mg total) by mouth 3 (three) times daily. What changed:    how much to take  when to take this   hydroxypropyl methylcellulose / hypromellose 2.5 % ophthalmic solution Commonly known as:  ISOPTO TEARS / GONIOVISC Place 1 drop into both eyes as needed.   levothyroxine 75 MCG tablet Commonly known as:  SYNTHROID,  LEVOTHROID TAKE 1 TABLET DAILY BEFORE BREAKFAST   lovastatin 40 MG tablet Commonly known as:  MEVACOR Take 1 tablet (40 mg total) by mouth at bedtime.   Melatonin 3 MG Tbdp Take 3-6 mg by mouth at bedtime as needed. What changed:  reasons to take this   omeprazole 40 MG capsule Commonly known as:  PRILOSEC Take 1 capsule (40 mg total) by mouth daily.   ondansetron 8 MG tablet Commonly known as:  ZOFRAN Take 1 tablet (8 mg total) by mouth every 8 (eight) hours as needed for nausea or vomiting.   OXcarbazepine 150 MG tablet Commonly known as:  TRILEPTAL Take 1 tablet (150 mg total) by mouth 2 (two) times daily.   oxyCODONE-acetaminophen 5-325 MG tablet Commonly known as:  PERCOCET/ROXICET Take 1 tablet by mouth every 8 (eight) hours as needed for severe pain.   rivaroxaban 20 MG Tabs tablet Commonly known as:  XARELTO Take 1 tablet (20 mg total) by mouth daily with supper. Start taking on:  04/08/2018 What changed:    See the new instructions.  These instructions start on 04/08/2018. If you are unsure what to do until then, ask your doctor or other care provider.   temozolomide 140 MG capsule Commonly known as:  TEMODAR TAKE 2 CAPSULES BY MOUTH DAILY.  MAY TAKE ON AN EMPTY STOMACH OR AT BEDTIME TO DECREASE NAUSEA AND VOMITING What changed:    how much to take  how to take this  when to take this  additional instructions        Signed: Sol Blazing 03/25/2018, 3:57 PM

## 2018-03-26 DIAGNOSIS — R338 Other retention of urine: Secondary | ICD-10-CM

## 2018-03-26 DIAGNOSIS — I309 Acute pericarditis, unspecified: Secondary | ICD-10-CM | POA: Diagnosis not present

## 2018-03-26 LAB — GLUCOSE, CAPILLARY
GLUCOSE-CAPILLARY: 131 mg/dL — AB (ref 70–99)
Glucose-Capillary: 94 mg/dL (ref 70–99)

## 2018-03-26 NOTE — Progress Notes (Signed)
Foley placed ~800 ml output. Education completed about home care.  Saddie Benders RN

## 2018-03-26 NOTE — Progress Notes (Signed)
PT was bladderscanned twice throughout the night. [facility] notified of >450cc on bladder scan. Pt tried to set up on bedside commode and I had running water in the background. Pt couldn't void.

## 2018-03-26 NOTE — Discharge Summary (Signed)
Please see d/c summary from 8/27- [name].   Patient's discharge was held for urinary retention-- foley had been placed early on 8/27 and removed but patient continued to have issues voiding-- replaced foley with > 800 out.  Plan to d/c home with foley and close follow up with patient's urologist Dr. Louis Meckel for voiding trial in the office. [name] DO

## 2018-03-26 NOTE — Progress Notes (Signed)
Patient up to void at [facility]. Had BM and voided ~50. Post void bladder scan ~550ml. Will place Foley to discharge with per MD Eliseo Squires with follow up appointment with [facility]. I will continue to monitor the patient closely.   Saddie Benders RN

## 2018-03-26 NOTE — Progress Notes (Signed)
[facility] notified of pts bladder scan of >450. Waiting for return page.

## 2018-03-26 NOTE — Progress Notes (Signed)
[facility] notified of pts bladder scan of >450. Waiting return page.

## 2018-03-27 ENCOUNTER — Telehealth: Payer: Self-pay | Admitting: *Deleted

## 2018-03-27 DIAGNOSIS — Z86711 Personal history of pulmonary embolism: Secondary | ICD-10-CM | POA: Diagnosis not present

## 2018-03-27 DIAGNOSIS — D539 Nutritional anemia, unspecified: Secondary | ICD-10-CM | POA: Diagnosis not present

## 2018-03-27 DIAGNOSIS — G40909 Epilepsy, unspecified, not intractable, without status epilepticus: Secondary | ICD-10-CM | POA: Diagnosis not present

## 2018-03-27 DIAGNOSIS — F329 Major depressive disorder, single episode, unspecified: Secondary | ICD-10-CM | POA: Diagnosis not present

## 2018-03-27 DIAGNOSIS — C3490 Malignant neoplasm of unspecified part of unspecified bronchus or lung: Secondary | ICD-10-CM | POA: Diagnosis not present

## 2018-03-27 DIAGNOSIS — I480 Paroxysmal atrial fibrillation: Secondary | ICD-10-CM | POA: Diagnosis not present

## 2018-03-27 DIAGNOSIS — D696 Thrombocytopenia, unspecified: Secondary | ICD-10-CM | POA: Diagnosis not present

## 2018-03-27 DIAGNOSIS — C259 Malignant neoplasm of pancreas, unspecified: Secondary | ICD-10-CM | POA: Diagnosis not present

## 2018-03-27 DIAGNOSIS — I10 Essential (primary) hypertension: Secondary | ICD-10-CM | POA: Diagnosis not present

## 2018-03-27 DIAGNOSIS — C787 Secondary malignant neoplasm of liver and intrahepatic bile duct: Secondary | ICD-10-CM | POA: Diagnosis not present

## 2018-03-27 DIAGNOSIS — Z6841 Body Mass Index (BMI) 40.0 and over, adult: Secondary | ICD-10-CM | POA: Diagnosis not present

## 2018-03-27 DIAGNOSIS — Z95828 Presence of other vascular implants and grafts: Secondary | ICD-10-CM | POA: Diagnosis not present

## 2018-03-27 DIAGNOSIS — Z8673 Personal history of transient ischemic attack (TIA), and cerebral infarction without residual deficits: Secondary | ICD-10-CM | POA: Diagnosis not present

## 2018-03-27 DIAGNOSIS — Z79899 Other long term (current) drug therapy: Secondary | ICD-10-CM | POA: Diagnosis not present

## 2018-03-27 DIAGNOSIS — F411 Generalized anxiety disorder: Secondary | ICD-10-CM | POA: Diagnosis not present

## 2018-03-27 DIAGNOSIS — Z7901 Long term (current) use of anticoagulants: Secondary | ICD-10-CM | POA: Diagnosis not present

## 2018-03-27 DIAGNOSIS — E669 Obesity, unspecified: Secondary | ICD-10-CM | POA: Diagnosis not present

## 2018-03-27 DIAGNOSIS — K219 Gastro-esophageal reflux disease without esophagitis: Secondary | ICD-10-CM | POA: Diagnosis not present

## 2018-03-27 DIAGNOSIS — I309 Acute pericarditis, unspecified: Secondary | ICD-10-CM | POA: Diagnosis not present

## 2018-03-27 DIAGNOSIS — Z87891 Personal history of nicotine dependence: Secondary | ICD-10-CM | POA: Diagnosis not present

## 2018-03-27 DIAGNOSIS — R339 Retention of urine, unspecified: Secondary | ICD-10-CM | POA: Diagnosis not present

## 2018-03-27 DIAGNOSIS — Z79891 Long term (current) use of opiate analgesic: Secondary | ICD-10-CM | POA: Diagnosis not present

## 2018-03-27 NOTE — Telephone Encounter (Signed)
Spoke with pts husband he stated that pt just got out of the hospital for fluid around her heart she had been there for 4 days he couldn't remember back to 2018-04-07 if pt had passed out or not, he stated that she has so many issues every time he turns around she has another.  informed pts husband to call device clinic back if she passes out. pts husband voiced understanding.

## 2018-03-27 NOTE — Telephone Encounter (Signed)
the patient husband returned lexi phone call.

## 2018-03-27 NOTE — Telephone Encounter (Signed)
Attempted to call patient at home, busy tone x 2. Spoke with patient's husband (DPR) for brief moment regarding wife pause episode noted on 03/28/18 for 6 seconds, phone got disconnected. Attempted to call patient's husband back, LVMOM for patient to return call to [facility]. [facility] phone number.

## 2018-03-28 DIAGNOSIS — R339 Retention of urine, unspecified: Secondary | ICD-10-CM | POA: Diagnosis not present

## 2018-03-28 DIAGNOSIS — C787 Secondary malignant neoplasm of liver and intrahepatic bile duct: Secondary | ICD-10-CM | POA: Diagnosis not present

## 2018-03-28 DIAGNOSIS — C259 Malignant neoplasm of pancreas, unspecified: Secondary | ICD-10-CM | POA: Diagnosis not present

## 2018-03-28 DIAGNOSIS — C3490 Malignant neoplasm of unspecified part of unspecified bronchus or lung: Secondary | ICD-10-CM | POA: Diagnosis not present

## 2018-03-28 DIAGNOSIS — D696 Thrombocytopenia, unspecified: Secondary | ICD-10-CM | POA: Diagnosis not present

## 2018-03-28 DIAGNOSIS — I309 Acute pericarditis, unspecified: Secondary | ICD-10-CM | POA: Diagnosis not present

## 2018-03-28 NOTE — Progress Notes (Signed)
NEUROLOGY FOLLOW UP OFFICE NOTE  KENLIE SEKI [id number]  HISTORY OF PRESENT ILLNESS: [name] is an 84 year old right-handed female with cerebellar degeneration, metastatic intermediate grade neuroendocrine carcinoma, hypertension, paroxysmal atrial fibrillation, IBS, hypothyroidism and history of TIA and simple partial seizures who follows up for left sided facial pain.  She is accompanied by her husband who supplements history.  UPDATE: She followed up with her dentist who took X-rays and believe that her pain is not related to her temporomandibular joint.  I started her on oxcarbazepine 150mg  twice daily.  Na level had dropped to 124, as demonstrated during recent hospitalization for pericarditis.  Last BMP prior to discharge showed a Na level of 134.  Since starting the oxcarbazepine, she has not had any facial pain.    Current medications:  Oxcarbazepine 150mg  twice daily; Gabapentin 800mg /400mg /800mg  for seizure prophylaxis.  Amitriptyline 25mg  at bedtime for burning tongue syndrome.  HISTORY: She reports history of an AVM on the right side of her face, diagnosed many years ago and therefore cannot use blood thinners.  However, prior MRA of the head and neck did not reveal any AVM.   She also reports pain from the right jaw radiating up to the right temple since 2017.  In 2018, she developed pain around the left eye which would hurt whenever she moved her eye.  It eventually resolved.  She then developed a severe aching pain at her left TMJ which radiates up into her temple.  It is daily.  It lasts a a few seconds and occurs throughout the day.  It may occur spontaneously but also is aggravated by exertion but not talking, chewing or brushing her teeth.  Sed rate from 06/26/17 was 34.  PAST MEDICAL HISTORY: Past Medical History:  Diagnosis Date  . Acute respiratory failure with hypoxia ([place]) 10/23/2015  . Allergic rhinitis    PT. DENIES  . Anxiety   . Arthritis    NECK    . Ataxia   . Bradycardia    primarily nocturnal  . Burning tongue syndrome 25 years  . Cancer ([place]) dx'd 06/2016   liver  . Cataract   . Cerebellar degeneration   . Chronic pericarditis   . Chronic urinary tract infection   . Complication of anesthesia    low o2 sats, coded 30 years ago  . CVA (cerebral infarction) 05/2003  . Depression   . Encounter for antineoplastic chemotherapy 10/09/2016  . Gait disorder   . Gastric polyps   . GERD (gastroesophageal reflux disease)   . Goals of care, counseling/discussion 10/09/2016  . High cholesterol   . Hyperlipidemia   . Hypotension   . Hypothyroidism   . IBS (irritable bowel syndrome)   . Obesity   . Paroxysmal atrial fibrillation ([name])    chads2vasc score is 6,  she is felt to be a poor candidate for anticoagulation  . Personal history of arterial venous malformation (AVM)    right side of face  . Seizure disorder ([place])   . Seizures ([place]) 2003   " smelling"- Gabapentin "no problem"  . Shortness of breath dyspnea    with exertion  . Sternum fx 10/27/2013  . Stroke [facility]) 5 years ago   Right side of face weak, slurred speach-   . Thyroid disease   . TIA (transient ischemic attack)   . UTI (lower urinary tract infection) 03/27/2016   "frequently"    MEDICATIONS: Current Outpatient Medications on File Prior to Visit  Medication  Sig Dispense Refill  . amitriptyline (ELAVIL) 25 MG tablet Take 1 tablet (25 mg total) by mouth at bedtime. 90 tablet 1  . capecitabine (XELODA) 500 MG tablet TAKE 3 TABLETS (1,500 MG TOTAL) BY MOUTH 2 (TWO) TIMES DAILY AFTER A MEAL. (Patient taking differently: Take 1,500 mg by mouth See admin instructions. TAKE 3 TABLETS (1,500 MG TOTAL) BY MOUTH 2 (TWO) TIMES DAILY AFTER A MEAL FOR 14 DAYS AS DIRECTED) 84 tablet 2  . colchicine 0.6 MG tablet Take 1 tablet (0.6 mg total) by mouth daily. 14 tablet 0  . escitalopram (LEXAPRO) 20 MG tablet Take 1 tablet (20 mg total) by mouth at bedtime. 90 tablet 1  .  fluticasone (FLONASE) 50 MCG/ACT nasal spray Place 2 sprays into both nostrils at bedtime. 16 g 3  . furosemide (LASIX) 20 MG tablet TAKE 1 TABLET DAILY AS NEEDED FOR FLUID RETENTION (Patient taking differently: Take 20 mg by mouth as needed for fluid or edema. TAKE 1 TABLET DAILY AS NEEDED FOR FLUID RETENTION) 90 tablet 0  . gabapentin (NEURONTIN) 400 MG capsule Take 1-2 capsules (400-800 mg total) by mouth 3 (three) times daily. (Patient taking differently: Take 800 mg by mouth 2 (two) times daily. ) 720 capsule 1  . hydroxypropyl methylcellulose / hypromellose (ISOPTO TEARS / GONIOVISC) 2.5 % ophthalmic solution Place 1 drop into both eyes as needed.     Marland Kitchen levothyroxine (SYNTHROID, LEVOTHROID) 75 MCG tablet TAKE 1 TABLET DAILY BEFORE BREAKFAST (Patient taking differently: Take 75 mcg by mouth daily before breakfast. ) 90 tablet 1  . lovastatin (MEVACOR) 40 MG tablet Take 1 tablet (40 mg total) by mouth at bedtime. 90 tablet 1  . Melatonin 3 MG TBDP Take 3-6 mg by mouth at bedtime as needed. (Patient taking differently: Take 3-6 mg by mouth at bedtime as needed (FOR SLEEP). ) 60 tablet 1  . omeprazole (PRILOSEC) 40 MG capsule Take 1 capsule (40 mg total) by mouth daily. 90 capsule 1  . ondansetron (ZOFRAN) 8 MG tablet Take 1 tablet (8 mg total) by mouth every 8 (eight) hours as needed for nausea or vomiting. 90 tablet 1  . OXcarbazepine (TRILEPTAL) 150 MG tablet Take 1 tablet (150 mg total) by mouth 2 (two) times daily. 60 tablet 3  . oxyCODONE-acetaminophen (PERCOCET) 5-325 MG tablet Take 1 tablet by mouth every 8 (eight) hours as needed for severe pain. 10 tablet 0  . [START ON 04/08/2018] rivaroxaban (XARELTO) 20 MG TABS tablet Take 1 tablet (20 mg total) by mouth daily with supper.    . temozolomide (TEMODAR) 140 MG capsule TAKE 2 CAPSULES BY MOUTH DAILY. MAY TAKE ON AN EMPTY STOMACH OR AT BEDTIME TO DECREASE NAUSEA AND VOMITING (Patient taking differently: Take 280 mg by mouth daily. TAKE 2  CAPSULES BY MOUTH DAILY. MAY TAKE ON AN EMPTY STOMACH OR AT BEDTIME TO DECREASE NAUSEA AND VOMITING *TO START 5 DAYS PRIOR TO COMPLETION OF 14 DAY COURSE OF XELODA REGIMEN AS DIRECTED*) 10 capsule 2   No current facility-administered medications on file prior to visit.     ALLERGIES: Allergies  Allergen Reactions  . Lisinopril Cough    FAMILY HISTORY: Family History  Problem Relation Age of Onset  . Heart attack Father 2       fatal  . Coronary artery disease Brother   . Diabetes Brother   . Prostate cancer Brother   . Prostate cancer Son     SOCIAL HISTORY: Social History   Socioeconomic History  .  Marital status: Married    Spouse name: Not on file  . Number of children: 6  . Years of education: Not on file  . Highest education level: 10th grade  Occupational History  . Occupation: Agricultural engineer  Social Needs  . Financial resource strain: Not hard at all  . Food insecurity:    Worry: Never true    Inability: Never true  . Transportation needs:    Medical: No    Non-medical: No  Tobacco Use  . Smoking status: Former Smoker    Packs/day: 0.25    Years: 10.00    Pack years: 2.50    Types: Cigarettes    Last attempt to quit: 07/31/1975    Years since quitting: 42.6  . Smokeless tobacco: Never Used  Substance and Sexual Activity  . Alcohol use: No    Comment: Rare- maybe a drink ever 2 years  . Drug use: No  . Sexual activity: Not Currently  Lifestyle  . Physical activity:    Days per week: 0 days    Minutes per session: 0 min  . Stress: Not at all  Relationships  . Social connections:    Talks on phone: More than three times a week    Gets together: More than three times a week    Attends religious service: Never    Active member of club or organization: No    Attends meetings of clubs or organizations: Never    Relationship status: Married  . Intimate partner violence:    Fear of current or ex partner: No    Emotionally abused: No    Physically abused:  No    Forced sexual activity: No  Other Topics Concern  . Not on file  Social History Narrative   Lives with husband, does have stairs, does not use them. Pt completed 10th grade.    REVIEW OF SYSTEMS: Constitutional: weak Eyes: No visual changes, double vision, eye pain Ear, nose and throat: No hearing loss, ear pain, nasal congestion, sore throat Cardiovascular: No chest pain, palpitations Respiratory:  No shortness of breath at rest or with exertion, wheezes GastrointestinaI: No nausea, vomiting, diarrhea, abdominal pain, fecal incontinence Genitourinary:  No dysuria, urinary retention or frequency Musculoskeletal:  No neck pain, back pain Integumentary: No rash, pruritus, skin lesions Neurological: as above Psychiatric: No depression, insomnia, anxiety Endocrine: weak Hematologic/Lymphatic:  No purpura, petechiae. Allergic/Immunologic: no itchy/runny eyes, nasal congestion, recent allergic reactions, rashes  PHYSICAL EXAM: Blood pressure (!) 114/58, pulse (!) 48, height 5\' 4"  (1.626 m), weight 225 lb (102.1 kg), SpO2 98 %. General: No acute distress.  Patient appears well-groomed.   Head:  Normocephalic/atraumatic Eyes:  Fundi examined but not visualized Neck: supple, no paraspinal tenderness, full range of motion Heart:  Regular rate and rhythm Lungs:  Clear to auscultation bilaterally Back: No paraspinal tenderness Neurological Exam: alert and oriented to person, place, and time. Attention span and concentration intact, recent and remote memory intact, fund of knowledge intact.  Speech fluent and not dysarthric, language intact.  CN II-XII intact. Bulk and tone normal, muscle strength 4+/5 upper extremities, 3+/5 lower extremities  Sensation to light touch  intact.  Deep tendon reflexes 2+ throughout, toes downgoing.  Finger to nose testing intact.  In wheelchair.  Unable to ambulate.  IMPRESSION: Left-sided trigeminal neuralgia, pain controlled.  PLAN: 1.  Continue  oxcarbazepine 150mg  twice daily 2.  She will follow up in December as already scheduled 3.  Recheck BMP (Na level) about a week  prior to follow up 4.  Instructed to contact me with any questions or concerns.  15 minutes spent face to face with patient, over 50% spent discussing management.  Metta Clines, DO  CC: Assunta Found, MD

## 2018-03-31 DIAGNOSIS — C787 Secondary malignant neoplasm of liver and intrahepatic bile duct: Secondary | ICD-10-CM | POA: Diagnosis not present

## 2018-03-31 DIAGNOSIS — C3490 Malignant neoplasm of unspecified part of unspecified bronchus or lung: Secondary | ICD-10-CM | POA: Diagnosis not present

## 2018-03-31 DIAGNOSIS — R339 Retention of urine, unspecified: Secondary | ICD-10-CM | POA: Diagnosis not present

## 2018-03-31 DIAGNOSIS — C259 Malignant neoplasm of pancreas, unspecified: Secondary | ICD-10-CM | POA: Diagnosis not present

## 2018-03-31 DIAGNOSIS — D696 Thrombocytopenia, unspecified: Secondary | ICD-10-CM | POA: Diagnosis not present

## 2018-03-31 DIAGNOSIS — I309 Acute pericarditis, unspecified: Secondary | ICD-10-CM | POA: Diagnosis not present

## 2018-04-01 ENCOUNTER — Encounter: Payer: Self-pay | Admitting: Neurology

## 2018-04-01 ENCOUNTER — Other Ambulatory Visit: Payer: Self-pay

## 2018-04-01 ENCOUNTER — Ambulatory Visit (INDEPENDENT_AMBULATORY_CARE_PROVIDER_SITE_OTHER): Payer: Medicare Other | Admitting: Neurology

## 2018-04-01 VITALS — BP 114/58 | HR 48 | Ht 64.0 in | Wt 225.0 lb

## 2018-04-01 DIAGNOSIS — G500 Trigeminal neuralgia: Secondary | ICD-10-CM

## 2018-04-01 NOTE — Patient Instructions (Signed)
1. Continue oxcarbazepine 150mg  twice daily 2.  Follow up in December as scheduled. 3.  I want to repeat BMP in 3 months (about a week prior to follow up) 4.  Contact me sooner with any concerns.

## 2018-04-02 DIAGNOSIS — D696 Thrombocytopenia, unspecified: Secondary | ICD-10-CM | POA: Diagnosis not present

## 2018-04-02 DIAGNOSIS — C259 Malignant neoplasm of pancreas, unspecified: Secondary | ICD-10-CM | POA: Diagnosis not present

## 2018-04-02 DIAGNOSIS — R339 Retention of urine, unspecified: Secondary | ICD-10-CM | POA: Diagnosis not present

## 2018-04-02 DIAGNOSIS — I309 Acute pericarditis, unspecified: Secondary | ICD-10-CM | POA: Diagnosis not present

## 2018-04-02 DIAGNOSIS — C787 Secondary malignant neoplasm of liver and intrahepatic bile duct: Secondary | ICD-10-CM | POA: Diagnosis not present

## 2018-04-02 DIAGNOSIS — C3490 Malignant neoplasm of unspecified part of unspecified bronchus or lung: Secondary | ICD-10-CM | POA: Diagnosis not present

## 2018-04-03 DIAGNOSIS — C259 Malignant neoplasm of pancreas, unspecified: Secondary | ICD-10-CM | POA: Diagnosis not present

## 2018-04-03 DIAGNOSIS — I309 Acute pericarditis, unspecified: Secondary | ICD-10-CM | POA: Diagnosis not present

## 2018-04-03 DIAGNOSIS — D696 Thrombocytopenia, unspecified: Secondary | ICD-10-CM | POA: Diagnosis not present

## 2018-04-03 DIAGNOSIS — C787 Secondary malignant neoplasm of liver and intrahepatic bile duct: Secondary | ICD-10-CM | POA: Diagnosis not present

## 2018-04-03 DIAGNOSIS — R339 Retention of urine, unspecified: Secondary | ICD-10-CM | POA: Diagnosis not present

## 2018-04-03 DIAGNOSIS — C3490 Malignant neoplasm of unspecified part of unspecified bronchus or lung: Secondary | ICD-10-CM | POA: Diagnosis not present

## 2018-04-03 MED FILL — TEMOZOLOMIDE 140 MG CAPSULE: 140 | 5 days supply | Qty: 10 | Fill #1

## 2018-04-03 MED FILL — CAPECITABINE 500 MG TABS: 500 | 14 days supply | Qty: 84 | Fill #1

## 2018-04-04 ENCOUNTER — Ambulatory Visit (INDEPENDENT_AMBULATORY_CARE_PROVIDER_SITE_OTHER): Payer: Medicare Other | Admitting: Pediatrics

## 2018-04-04 ENCOUNTER — Ambulatory Visit: Payer: Medicare Other | Admitting: Pediatrics

## 2018-04-04 ENCOUNTER — Encounter: Payer: Self-pay | Admitting: Pediatrics

## 2018-04-04 VITALS — BP 135/70 | HR 60 | Temp 97.1°F | Resp 22 | Ht 64.0 in | Wt 232.4 lb

## 2018-04-04 DIAGNOSIS — F419 Anxiety disorder, unspecified: Secondary | ICD-10-CM | POA: Diagnosis not present

## 2018-04-04 DIAGNOSIS — E871 Hypo-osmolality and hyponatremia: Secondary | ICD-10-CM | POA: Diagnosis not present

## 2018-04-04 DIAGNOSIS — Z86711 Personal history of pulmonary embolism: Secondary | ICD-10-CM

## 2018-04-04 DIAGNOSIS — I319 Disease of pericardium, unspecified: Secondary | ICD-10-CM | POA: Diagnosis not present

## 2018-04-04 MED ORDER — ALPRAZOLAM 0.25 MG PO TABS: 0.2500 mg | ORAL_TABLET | Freq: Two times a day (BID) | ORAL | 1 refills | Status: DC | PRN

## 2018-04-04 NOTE — Progress Notes (Signed)
  Subjective:   Patient ID: [name], female    DOB: 1934/01/20, 84 y.o.   MRN: [id number] CC: Hospitalization Follow-up (Pericarditis)  HPI: [name] is a 84 y.o. female   Here today with her husband and daughter.  Recent hospitalization for pericarditis, discharged 8/27.  Now on colchicine for 2 weeks, Xarelto for history of PE status post IVC filter on hold until colchicine stopped.  Patient hypercoagulable due to neuroendocrine carcinoma, multiple metastatic liver lesions.  Currently on chemotherapy followed with Dr. Earlie Server in [place].  Trigeminal neuralgia: Recently started on oxcarbazepine.  She says headaches and facial pain has almost completely resolved.  She has been very pleased.  Anxiety: Much worse since leaving the hospital.  She is not able to rest at night, worrying constantly.  Tearful off and on at home at night and during the day.  She has taken 2 tabs of Xanax in the last week that was prescribed months ago with  relief in symptoms. She was able to get a night of rest last night for the first time in days.  She asks if she could have a refill.  Taking Lexapro daily.  Relevant past medical, surgical, family and social history reviewed. Allergies and medications reviewed and updated. Social History   Tobacco Use  Smoking Status Former Smoker  . Packs/day: 0.25  . Years: 10.00  . Pack years: 2.50  . Types: Cigarettes  . Last attempt to quit: 07/31/1975  . Years since quitting: 42.7  Smokeless Tobacco Never Used   ROS: Per HPI   Objective:    BP 135/70   Pulse 60   Temp (!) 97.1 F (36.2 C) (Oral)   Resp (!) 22   Ht '5\' 4"'$  (1.626 m)   Wt 232 lb 6.4 oz (105.4 kg)   SpO2 99%   BMI 39.89 kg/m   Wt Readings from Last 3 Encounters:  04/04/18 232 lb 6.4 oz (105.4 kg)  04/01/18 225 lb (102.1 kg)  03/25/18 235 lb 0.2 oz (106.6 kg)    Gen: NAD, anxious at times, alert, cooperative with exam, NCAT EYES: EOMI, no conjunctival injection, or no  icterus ENT:  OP without erythema CV: NRRR, normal S1/S2, no murmur, distal pulses 2+ b/l Resp: CTABL, no wheezes, normal WOB Abd: +BS, soft, NTND.  Ext: No edema, warm Neuro: Alert and oriented Psych: Tearful at times.  Normal affect.  Assessment & Plan:  [name] was seen today for hospitalization follow-up.  Diagnoses and all orders for this visit:  Hyponatremia Hyponatremic while in the hospital.  Most recent sodium 134.  Recently started on oxcarbazepine.  Will repeat level. -     BMP8+EGFR  Anxiety Continue SSRI.  Takes Xanax 1-2 times a day as needed. -     ALPRAZolam (XANAX) 0.25 MG tablet; Take 1 tablet (0.25 mg total) by mouth 2 (two) times daily as needed for anxiety.  Pericarditis, unspecified chronicity, unspecified type On colchicine, aspirin for another few days, then stopping and restarting Xarelto.  Has follow-up with cardiology in a couple weeks.  History of pulmonary embolism Restarting Xarelto after colchicine.  Has IVC filter in place.  Follow up plan: Return in about 6 weeks (around 05/16/2018). Assunta Found, MD [place]

## 2018-04-05 LAB — BMP8+EGFR
BUN/Creatinine Ratio: 12 (ref 12–28)
BUN: 12 mg/dL (ref 8–27)
CALCIUM: 9.7 mg/dL (ref 8.7–10.3)
CHLORIDE: 90 mmol/L — AB (ref 96–106)
CO2: 23 mmol/L (ref 20–29)
Creatinine, Ser: 0.97 mg/dL (ref 0.57–1.00)
GFR calc Af Amer: 63 mL/min/{1.73_m2} (ref >59)
GFR, EST NON AFRICAN AMERICAN: 55 mL/min/{1.73_m2} — AB (ref >59)
Glucose: 119 mg/dL — ABNORMAL HIGH (ref 65–99)
POTASSIUM: 4.2 mmol/L (ref 3.5–5.2)
Sodium: 128 mmol/L — ABNORMAL LOW (ref 134–144)

## 2018-04-07 DIAGNOSIS — C259 Malignant neoplasm of pancreas, unspecified: Secondary | ICD-10-CM | POA: Diagnosis not present

## 2018-04-07 DIAGNOSIS — C3490 Malignant neoplasm of unspecified part of unspecified bronchus or lung: Secondary | ICD-10-CM | POA: Diagnosis not present

## 2018-04-07 DIAGNOSIS — D696 Thrombocytopenia, unspecified: Secondary | ICD-10-CM | POA: Diagnosis not present

## 2018-04-07 DIAGNOSIS — C787 Secondary malignant neoplasm of liver and intrahepatic bile duct: Secondary | ICD-10-CM | POA: Diagnosis not present

## 2018-04-07 DIAGNOSIS — I309 Acute pericarditis, unspecified: Secondary | ICD-10-CM | POA: Diagnosis not present

## 2018-04-07 DIAGNOSIS — R339 Retention of urine, unspecified: Secondary | ICD-10-CM | POA: Diagnosis not present

## 2018-04-08 DIAGNOSIS — R339 Retention of urine, unspecified: Secondary | ICD-10-CM | POA: Diagnosis not present

## 2018-04-09 DIAGNOSIS — R339 Retention of urine, unspecified: Secondary | ICD-10-CM | POA: Diagnosis not present

## 2018-04-09 DIAGNOSIS — I309 Acute pericarditis, unspecified: Secondary | ICD-10-CM | POA: Diagnosis not present

## 2018-04-09 DIAGNOSIS — C259 Malignant neoplasm of pancreas, unspecified: Secondary | ICD-10-CM | POA: Diagnosis not present

## 2018-04-09 DIAGNOSIS — C3490 Malignant neoplasm of unspecified part of unspecified bronchus or lung: Secondary | ICD-10-CM | POA: Diagnosis not present

## 2018-04-09 DIAGNOSIS — D696 Thrombocytopenia, unspecified: Secondary | ICD-10-CM | POA: Diagnosis not present

## 2018-04-09 DIAGNOSIS — C787 Secondary malignant neoplasm of liver and intrahepatic bile duct: Secondary | ICD-10-CM | POA: Diagnosis not present

## 2018-04-11 ENCOUNTER — Ambulatory Visit (INDEPENDENT_AMBULATORY_CARE_PROVIDER_SITE_OTHER): Payer: Medicare Other | Admitting: *Deleted

## 2018-04-11 DIAGNOSIS — I309 Acute pericarditis, unspecified: Secondary | ICD-10-CM | POA: Diagnosis not present

## 2018-04-11 DIAGNOSIS — R002 Palpitations: Secondary | ICD-10-CM

## 2018-04-11 DIAGNOSIS — C787 Secondary malignant neoplasm of liver and intrahepatic bile duct: Secondary | ICD-10-CM | POA: Diagnosis not present

## 2018-04-11 DIAGNOSIS — C259 Malignant neoplasm of pancreas, unspecified: Secondary | ICD-10-CM | POA: Diagnosis not present

## 2018-04-11 DIAGNOSIS — C3490 Malignant neoplasm of unspecified part of unspecified bronchus or lung: Secondary | ICD-10-CM | POA: Diagnosis not present

## 2018-04-11 DIAGNOSIS — R339 Retention of urine, unspecified: Secondary | ICD-10-CM | POA: Diagnosis not present

## 2018-04-11 DIAGNOSIS — D696 Thrombocytopenia, unspecified: Secondary | ICD-10-CM | POA: Diagnosis not present

## 2018-04-12 NOTE — Progress Notes (Signed)
Carelink Summary Report / Loop Recorder 

## 2018-04-14 ENCOUNTER — Ambulatory Visit (INDEPENDENT_AMBULATORY_CARE_PROVIDER_SITE_OTHER): Payer: Medicare Other

## 2018-04-14 ENCOUNTER — Encounter: Payer: Self-pay | Admitting: Physician Assistant

## 2018-04-14 ENCOUNTER — Ambulatory Visit (INDEPENDENT_AMBULATORY_CARE_PROVIDER_SITE_OTHER): Payer: Medicare Other | Admitting: Physician Assistant

## 2018-04-14 VITALS — BP 136/70 | HR 79 | Ht 64.0 in | Wt 227.0 lb

## 2018-04-14 DIAGNOSIS — D539 Nutritional anemia, unspecified: Secondary | ICD-10-CM

## 2018-04-14 DIAGNOSIS — E871 Hypo-osmolality and hyponatremia: Secondary | ICD-10-CM | POA: Diagnosis not present

## 2018-04-14 DIAGNOSIS — I300 Acute nonspecific idiopathic pericarditis: Secondary | ICD-10-CM | POA: Diagnosis not present

## 2018-04-14 DIAGNOSIS — R600 Localized edema: Secondary | ICD-10-CM

## 2018-04-14 DIAGNOSIS — D696 Thrombocytopenia, unspecified: Secondary | ICD-10-CM | POA: Diagnosis not present

## 2018-04-14 DIAGNOSIS — G40909 Epilepsy, unspecified, not intractable, without status epilepticus: Secondary | ICD-10-CM

## 2018-04-14 DIAGNOSIS — C3490 Malignant neoplasm of unspecified part of unspecified bronchus or lung: Secondary | ICD-10-CM | POA: Diagnosis not present

## 2018-04-14 DIAGNOSIS — E669 Obesity, unspecified: Secondary | ICD-10-CM

## 2018-04-14 DIAGNOSIS — I10 Essential (primary) hypertension: Secondary | ICD-10-CM | POA: Diagnosis not present

## 2018-04-14 DIAGNOSIS — F329 Major depressive disorder, single episode, unspecified: Secondary | ICD-10-CM

## 2018-04-14 DIAGNOSIS — C259 Malignant neoplasm of pancreas, unspecified: Secondary | ICD-10-CM | POA: Diagnosis not present

## 2018-04-14 DIAGNOSIS — K219 Gastro-esophageal reflux disease without esophagitis: Secondary | ICD-10-CM

## 2018-04-14 DIAGNOSIS — C787 Secondary malignant neoplasm of liver and intrahepatic bile duct: Secondary | ICD-10-CM

## 2018-04-14 DIAGNOSIS — I309 Acute pericarditis, unspecified: Secondary | ICD-10-CM

## 2018-04-14 DIAGNOSIS — E8779 Other fluid overload: Secondary | ICD-10-CM

## 2018-04-14 DIAGNOSIS — I480 Paroxysmal atrial fibrillation: Secondary | ICD-10-CM | POA: Diagnosis not present

## 2018-04-14 DIAGNOSIS — F411 Generalized anxiety disorder: Secondary | ICD-10-CM

## 2018-04-14 DIAGNOSIS — Z79891 Long term (current) use of opiate analgesic: Secondary | ICD-10-CM

## 2018-04-14 DIAGNOSIS — R339 Retention of urine, unspecified: Secondary | ICD-10-CM | POA: Diagnosis not present

## 2018-04-14 DIAGNOSIS — Z7901 Long term (current) use of anticoagulants: Secondary | ICD-10-CM

## 2018-04-14 DIAGNOSIS — Z79899 Other long term (current) drug therapy: Secondary | ICD-10-CM

## 2018-04-14 DIAGNOSIS — Z8673 Personal history of transient ischemic attack (TIA), and cerebral infarction without residual deficits: Secondary | ICD-10-CM

## 2018-04-14 DIAGNOSIS — Z6841 Body Mass Index (BMI) 40.0 and over, adult: Secondary | ICD-10-CM

## 2018-04-14 NOTE — Patient Instructions (Addendum)
Medication Instructions:  Take Lasix 20mg  daily as needed for LE swelling on awakening or if calf size is up a 1/2 inch or episodes of smothering when laying down.  Labwork: Bmet today  Testing/Procedures: None ordered  Follow-Up: Follow up as planned with Dr.Allred   Your physician recommends that you schedule a follow-up appointment in: 3 months with Dr.Berry   Any Other Special Instructions Will Be Listed Below (If Applicable). Consider purchasing a pulse ox (it measure O2 saturation and heartrate)  Measure both calves daily and record measurements  Call the office if you develop chest pain   If you need a refill on your cardiac medications before your next appointment, please call your pharmacy.

## 2018-04-14 NOTE — Progress Notes (Signed)
Cardiology Office Note   Date:  04/14/2018   ID:  Willette, Mudry 05-07-1934, MRN [id number]  PCP:  Eustaquio Maize, MD  Cardiologist:  Dr Gwenlyn Found EP: Dr Madolyn Frieze, PA-C   Chief Complaint  Patient presents with  . Follow-up    Pt states no Sx    History of Present Illness: [name] is a 84 y.o. female with a history of PE s/p IVC filter on Xarelto, neuroendocrine carcinoma>> hypercoagulable state w/ mult liver mets, trigeminal neuralgia, anxiety, PAF  Admitted 02/2018 for pericarditis  [name] presents for cardiology follow up.  She is very weak. Cannot stand on her own. She is not able to weigh daily, balance is very poor. That is due to cerebellar degeneration.   She describes problems w/ breathing. She was having "panic attacks". They would start at rest, sitting up at times, but generally lying down. She would feel like she was smothering. No oxygen check at that time. She will sit up and feel better. Generally they would happen when she was lying down.  Her husband noticed some swelling and gave her Lasix, the Lasix helped.  She has not had 1 of the spells since taking the Lasix.  Her sodium is low. It has been as low as 125.   She has been told to drink more water by the urologist.   She was also told to drink a lot of water to flush out the chemo.  Dr Evette Doffing told her to eat more salt and drink less water. She is drinking less than a quart of all liquids a day.    The trigeminal neuralgia is well-controlled by the oxcarbazepine, but a major side effect is hyponatremia.   She gets chest pain about once a week. It is not exertional. She does not get it checked out. Nothing in particular makes it go away.  However, it always resolves spontaneously.   Past Medical History:  Diagnosis Date  . Acute respiratory failure with hypoxia ([place]) 10/23/2015  . Allergic rhinitis    PT. DENIES  . Anxiety   . Arthritis    NECK  . Ataxia   .  Bradycardia    primarily nocturnal  . Burning tongue syndrome 25 years  . Cancer ([place]) dx'd 06/2016   liver  . Cataract   . Cerebellar degeneration   . Chronic pericarditis   . Chronic urinary tract infection   . Complication of anesthesia    low o2 sats, coded 30 years ago  . CVA (cerebral infarction) 05/2003  . Depression   . Encounter for antineoplastic chemotherapy 10/09/2016  . Gait disorder   . Gastric polyps   . GERD (gastroesophageal reflux disease)   . Goals of care, counseling/discussion 10/09/2016  . High cholesterol   . Hyperlipidemia   . Hypotension   . Hypothyroidism   . IBS (irritable bowel syndrome)   . Obesity   . Paroxysmal atrial fibrillation ([name])    chads2vasc score is 6,  she is felt to be a poor candidate for anticoagulation  . Pericarditis   . Personal history of arterial venous malformation (AVM)    right side of face  . Seizure disorder ([place])   . Seizures ([place]) 2003   " smelling"- Gabapentin "no problem"  . Shortness of breath dyspnea    with exertion  . Sternum fx 10/27/2013  . Stroke [facility]) 5 years ago   Right side of face weak, slurred speach-   .  Thyroid disease   . TIA (transient ischemic attack)   . UTI (lower urinary tract infection) 03/27/2016   "frequently"    Past Surgical History:  Procedure Laterality Date  . APPENDECTOMY  84 years old  . COLONOSCOPY  2006, 2009  . COLONOSCOPY WITH PROPOFOL N/A 03/28/2016   Procedure: COLONOSCOPY WITH PROPOFOL;  Surgeon: Gatha Mayer, MD;  Location: [place] [place];  Service: Endoscopy;  Laterality: N/A;  . cyst removed  35 years ago  . EP IMPLANTABLE DEVICE N/A 01/06/2015   Procedure: Loop Recorder Insertion;  Surgeon: Thompson Grayer, MD;  Location: [place] [facility];  Service: Cardiovascular;  Laterality: N/A;  . EYE SURGERY Right    Cataract  . IR ANGIOGRAM SELECTIVE EACH ADDITIONAL VESSEL  11/28/2016  . IR ANGIOGRAM SELECTIVE EACH ADDITIONAL VESSEL  11/28/2016  . IR ANGIOGRAM SELECTIVE EACH  ADDITIONAL VESSEL  11/28/2016  . IR ANGIOGRAM SELECTIVE EACH ADDITIONAL VESSEL  11/28/2016  . IR ANGIOGRAM SELECTIVE EACH ADDITIONAL VESSEL  12/13/2016  . IR ANGIOGRAM SELECTIVE EACH ADDITIONAL VESSEL  12/13/2016  . IR ANGIOGRAM SELECTIVE EACH ADDITIONAL VESSEL  01/09/2017  . IR ANGIOGRAM VISCERAL SELECTIVE  11/28/2016  . IR ANGIOGRAM VISCERAL SELECTIVE  11/28/2016  . IR ANGIOGRAM VISCERAL SELECTIVE  12/13/2016  . IR ANGIOGRAM VISCERAL SELECTIVE  12/13/2016  . IR ANGIOGRAM VISCERAL SELECTIVE  01/09/2017  . IR EMBO ARTERIAL NOT HEMORR HEMANG INC GUIDE ROADMAPPING  11/28/2016  . IR EMBO TUMOR ORGAN ISCHEMIA INFARCT INC GUIDE ROADMAPPING  12/13/2016  . IR EMBO TUMOR ORGAN ISCHEMIA INFARCT INC GUIDE ROADMAPPING  01/09/2017  . IR IVC FILTER PLMT / S&I /IMG GUID/MOD SED  04/24/2017  . IR RADIOLOGIST EVAL & MGMT  11/06/2016  . IR RADIOLOGIST EVAL & MGMT  01/02/2017  . IR RADIOLOGIST EVAL & MGMT  02/05/2017  . IR RADIOLOGIST EVAL & MGMT  05/02/2017  . IR RADIOLOGIST EVAL & MGMT  10/17/2017  . IR US GUIDE VASC ACCESS RIGHT  11/28/2016  . IR US GUIDE VASC ACCESS RIGHT  12/13/2016  . IR US GUIDE VASC ACCESS RIGHT  01/09/2017  . KNEE ARTHROSCOPY Right 11/14/2006  . KNEE ARTHROSCOPY Bilateral 5 and 6 years ago  . KNEE ARTHROSCOPY WITH LATERAL MENISECTOMY  07/03/2012   Procedure: KNEE ARTHROSCOPY WITH LATERAL MENISECTOMY;  Surgeon: Magnus Sinning, MD;  Location: [facility];  Service: Orthopedics;  Laterality: Left;  with Partial Lateral Menisectomy and Medial Menisectomy. Shaving of medial and lateral femoral condyles. Shaving of patella. Removal of a loose body  . tibial and fibular internal fixation Left   . TOTAL ABDOMINAL HYSTERECTOMY  84 years old  . UPPER GASTROINTESTINAL ENDOSCOPY  2009, 2013    Current Outpatient Medications  Medication Sig Dispense Refill  . ALPRAZolam (XANAX) 0.25 MG tablet Take 1 tablet (0.25 mg total) by mouth 2 (two) times daily as needed for anxiety. 30 tablet 1  . amitriptyline (ELAVIL) 25 MG  tablet Take 1 tablet (25 mg total) by mouth at bedtime. 90 tablet 1  . capecitabine (XELODA) 500 MG tablet TAKE 3 TABLETS (1,500 MG TOTAL) BY MOUTH 2 (TWO) TIMES DAILY AFTER A MEAL. (Patient taking differently: Take 1,500 mg by mouth See admin instructions. TAKE 3 TABLETS (1,500 MG TOTAL) BY MOUTH 2 (TWO) TIMES DAILY AFTER A MEAL FOR 14 DAYS AS DIRECTED) 84 tablet 2  . escitalopram (LEXAPRO) 20 MG tablet Take 1 tablet (20 mg total) by mouth at bedtime. 90 tablet 1  . fluticasone (FLONASE) 50 MCG/ACT nasal spray Place 2  sprays into both nostrils at bedtime. 16 g 3  . furosemide (LASIX) 20 MG tablet TAKE 1 TABLET DAILY AS NEEDED FOR FLUID RETENTION (Patient taking differently: Take 20 mg by mouth as needed for fluid or edema. TAKE 1 TABLET DAILY AS NEEDED FOR FLUID RETENTION) 90 tablet 0  . gabapentin (NEURONTIN) 400 MG capsule Take 1-2 capsules (400-800 mg total) by mouth 3 (three) times daily. (Patient taking differently: Take 800 mg by mouth 2 (two) times daily. ) 720 capsule 1  . hydroxypropyl methylcellulose / hypromellose (ISOPTO TEARS / GONIOVISC) 2.5 % ophthalmic solution Place 1 drop into both eyes as needed.     Marland Kitchen levothyroxine (SYNTHROID, LEVOTHROID) 75 MCG tablet TAKE 1 TABLET DAILY BEFORE BREAKFAST (Patient taking differently: Take 75 mcg by mouth daily before breakfast. ) 90 tablet 1  . lovastatin (MEVACOR) 40 MG tablet Take 1 tablet (40 mg total) by mouth at bedtime. 90 tablet 1  . Melatonin 3 MG TBDP Take 3-6 mg by mouth at bedtime as needed. (Patient taking differently: Take 3-6 mg by mouth at bedtime as needed (FOR SLEEP). ) 60 tablet 1  . omeprazole (PRILOSEC) 40 MG capsule Take 1 capsule (40 mg total) by mouth daily. 90 capsule 1  . ondansetron (ZOFRAN) 8 MG tablet Take 1 tablet (8 mg total) by mouth every 8 (eight) hours as needed for nausea or vomiting. 90 tablet 1  . OXcarbazepine (TRILEPTAL) 150 MG tablet Take 1 tablet (150 mg total) by mouth 2 (two) times daily. 60 tablet 3  .  oxyCODONE-acetaminophen (PERCOCET) 5-325 MG tablet Take 1 tablet by mouth every 8 (eight) hours as needed for severe pain. 10 tablet 0  . rivaroxaban (XARELTO) 20 MG TABS tablet Take 1 tablet (20 mg total) by mouth daily with supper.    . temozolomide (TEMODAR) 140 MG capsule TAKE 2 CAPSULES BY MOUTH DAILY. MAY TAKE ON AN EMPTY STOMACH OR AT BEDTIME TO DECREASE NAUSEA AND VOMITING (Patient taking differently: Take 280 mg by mouth daily. TAKE 2 CAPSULES BY MOUTH DAILY. MAY TAKE ON AN EMPTY STOMACH OR AT BEDTIME TO DECREASE NAUSEA AND VOMITING *TO START 5 DAYS PRIOR TO COMPLETION OF 14 DAY COURSE OF XELODA REGIMEN AS DIRECTED*) 10 capsule 2   No current facility-administered medications for this visit.     Allergies:   Lisinopril    Social History:  The patient  reports that she quit smoking about 42 years ago. Her smoking use included cigarettes. She has a 2.50 pack-year smoking history. She has never used smokeless tobacco. She reports that she does not drink alcohol or use drugs.   Family History:  The patient's family history includes Coronary artery disease in her brother; Diabetes in her brother; Heart attack (age of onset: 79) in her father; Prostate cancer in her brother and son.    ROS:  Please see the history of present illness. All other systems are reviewed and negative.    PHYSICAL EXAM: VS:  BP 136/70   Pulse 79   Ht 5\' 4"  (1.626 m)   Wt 227 lb (103 kg)   BMI 38.96 kg/m  , BMI Body mass index is 38.96 kg/m. GEN: Well nourished, well developed, female in no acute distress  HEENT: normal for age  Neck: Mild JVD, no carotid bruit, no masses Cardiac: RRR; soft murmur, no rubs, or gallops Respiratory: Decreased breath sounds bases bilaterally, normal work of breathing GI: soft, nontender, nondistended, + BS MS: no deformity or atrophy; 1+ pedal edema; distal  pulses are 2+ in upper extremities, decreased pedal pulses due to edema Skin: warm and dry, no rash Neuro:  Strength  and sensation are intact Psych: euthymic mood, full affect   EKG:  EKG is not ordered today.  ECHO: 03/24/2018 - Left ventricle: The cavity size was normal. Wall thickness was   increased in a pattern of mild LVH. Systolic function was   vigorous. The estimated ejection fraction was in the range of 65%   to 70%. Wall motion was normal; there were no regional wall   motion abnormalities. Doppler parameters are consistent with   abnormal left ventricular relaxation (grade 1 diastolic   dysfunction). The E/e&' ratio is between 8-15, suggesting   indeterminate LV filling pressure. - Aortic valve: Sclerosis without stenosis. There was mild   regurgitation. - Aorta: Ascending aortic diameter: 39 mm (S). - Ascending aorta: The ascending aorta is dilated. - Mitral valve: Mildly thickened leaflets . There was trivial   regurgitation. - Left atrium: The atrium was normal in size. - Tricuspid valve: There was trivial regurgitation. - Pulmonic valve: There was mild regurgitation. - Pulmonary arteries: PA peak pressure: 34 mm Hg (S). - Inferior vena cava: The vessel was normal in size. The   respirophasic diameter changes were in the normal range (= 50%),   consistent with normal central venous pressure. - Pericardium, extracardiac: The pericardium was normal in   appearance. There was no pericardial effusion.  Impressions:  - Compared to an echo in 2018, the LVEF is higher at 65-70%. Grade   1 DD is noted. The ascending aorta is dilated to 3.9 cm with mild   AI. No pericardial thickening or effusion is noted.   Recent Labs: 07/10/2017: TSH 3.460 03/11/2018: ALT 18 03/25/2018: Hemoglobin 10.9; Platelets 114 04/04/2018: BUN 12; Creatinine, Ser 0.97; Potassium 4.2; Sodium 128    Lipid Panel    Component Value Date/Time   CHOL 124 07/15/2016 0444   CHOL 193 09/30/2015 1540   TRIG 65 07/15/2016 0444   TRIG 131 10/12/2014 1507   HDL 50 07/15/2016 0444   HDL 65 09/30/2015 1540   HDL  59 10/12/2014 1507   CHOLHDL 2.5 07/15/2016 0444   VLDL 13 07/15/2016 0444   LDLCALC 61 07/15/2016 0444   LDLCALC 111 (H) 09/30/2015 1540     Wt Readings from Last 3 Encounters:  04/14/18 227 lb (103 kg)  04/04/18 232 lb 6.4 oz (105.4 kg)  04/01/18 225 lb (102.1 kg)     Other studies Reviewed: Additional studies/ records that were reviewed today include: Office notes, hospital records and testing.  ASSESSMENT AND PLAN:  1.  Pericarditis: Her husband wonders why the pericarditis has returned. -I explained that there were several potential triggers for the pericarditis. - I explained that the colchicine worked, she gets more pericarditis, we will restart it.  2.  Lower extremity edema/volume overload: Her EF was normal by recent echo and only grade 1 diastolic dysfunction -However, she needs to drink extra water because of the chemotherapy and her history of UTIs. -She was also told to eat extra sodium to try and get her sodium up. - She is not able to stand on scale to weigh herself. - Her husband is going to check her legs for edema on a daily basis. -If she starts getting smothering spells when she lays down or if her legs increase by half an inch in diameter, he is to give her Lasix. -Check a BMET today -It might also be  helpful for him to have a pulse ox so that he can check her oxygen levels.  3.  Lung cancer: She is on chemotherapy that she takes daily for 2 weeks and then is off for 2 weeks.   Current medicines are reviewed at length with the patient today.  The patient has concerns regarding medicines.  Concerns were addressed  The following changes have been made: See Lasix instructions  Labs/ tests ordered today include:   Orders Placed This Encounter  Procedures  . Basic metabolic panel     Disposition:   FU with Dr. Alvester Chou and Dr. Rayann Heman as scheduled  Signed, Rosaria Ferries, PA-C  04/14/2018 4:08 PM    [place] Phone: [phone]; Fax: [phone]  This note was written with the assistance of speech recognition software. Please excuse any transcriptional errors.

## 2018-04-15 ENCOUNTER — Other Ambulatory Visit: Payer: Self-pay | Admitting: Internal Medicine

## 2018-04-15 DIAGNOSIS — D696 Thrombocytopenia, unspecified: Secondary | ICD-10-CM | POA: Diagnosis not present

## 2018-04-15 DIAGNOSIS — I309 Acute pericarditis, unspecified: Secondary | ICD-10-CM | POA: Diagnosis not present

## 2018-04-15 DIAGNOSIS — R339 Retention of urine, unspecified: Secondary | ICD-10-CM | POA: Diagnosis not present

## 2018-04-15 DIAGNOSIS — C3490 Malignant neoplasm of unspecified part of unspecified bronchus or lung: Secondary | ICD-10-CM | POA: Diagnosis not present

## 2018-04-15 DIAGNOSIS — C787 Secondary malignant neoplasm of liver and intrahepatic bile duct: Secondary | ICD-10-CM | POA: Diagnosis not present

## 2018-04-15 DIAGNOSIS — C259 Malignant neoplasm of pancreas, unspecified: Secondary | ICD-10-CM | POA: Diagnosis not present

## 2018-04-15 LAB — BASIC METABOLIC PANEL
BUN/Creatinine Ratio: 16 (ref 12–28)
BUN: 14 mg/dL (ref 8–27)
CALCIUM: 9.9 mg/dL (ref 8.7–10.3)
CO2: 21 mmol/L (ref 20–29)
CREATININE: 0.87 mg/dL (ref 0.57–1.00)
Chloride: 97 mmol/L (ref 96–106)
GFR calc Af Amer: 72 mL/min/{1.73_m2} (ref >59)
GFR, EST NON AFRICAN AMERICAN: 62 mL/min/{1.73_m2} (ref >59)
Glucose: 91 mg/dL (ref 65–99)
Potassium: 4.4 mmol/L (ref 3.5–5.2)
Sodium: 136 mmol/L (ref 134–144)

## 2018-04-16 LAB — CUP PACEART REMOTE DEVICE CHECK
Implantable Pulse Generator Implant Date: 20160609
MDC IDC SESS DTM: 20190812013737

## 2018-04-17 DIAGNOSIS — C3490 Malignant neoplasm of unspecified part of unspecified bronchus or lung: Secondary | ICD-10-CM | POA: Diagnosis not present

## 2018-04-17 DIAGNOSIS — C259 Malignant neoplasm of pancreas, unspecified: Secondary | ICD-10-CM | POA: Diagnosis not present

## 2018-04-17 DIAGNOSIS — R339 Retention of urine, unspecified: Secondary | ICD-10-CM | POA: Diagnosis not present

## 2018-04-17 DIAGNOSIS — I309 Acute pericarditis, unspecified: Secondary | ICD-10-CM | POA: Diagnosis not present

## 2018-04-17 DIAGNOSIS — D696 Thrombocytopenia, unspecified: Secondary | ICD-10-CM | POA: Diagnosis not present

## 2018-04-17 DIAGNOSIS — C787 Secondary malignant neoplasm of liver and intrahepatic bile duct: Secondary | ICD-10-CM | POA: Diagnosis not present

## 2018-04-18 ENCOUNTER — Inpatient Hospital Stay: Payer: Medicare Other | Attending: Internal Medicine

## 2018-04-18 ENCOUNTER — Ambulatory Visit (HOSPITAL_COMMUNITY)
Admission: RE | Admit: 2018-04-18 | Discharge: 2018-04-18 | Disposition: A | Discharge status: Home / Self Care | Payer: Medicare Other | Source: Ambulatory Visit | Attending: Internal Medicine | Admitting: Internal Medicine

## 2018-04-18 DIAGNOSIS — I700 Atherosclerosis of aorta: Secondary | ICD-10-CM | POA: Insufficient documentation

## 2018-04-18 DIAGNOSIS — C787 Secondary malignant neoplasm of liver and intrahepatic bile duct: Secondary | ICD-10-CM | POA: Diagnosis not present

## 2018-04-18 DIAGNOSIS — R42 Dizziness and giddiness: Secondary | ICD-10-CM | POA: Insufficient documentation

## 2018-04-18 DIAGNOSIS — C7A8 Other malignant neuroendocrine tumors: Secondary | ICD-10-CM | POA: Diagnosis not present

## 2018-04-18 DIAGNOSIS — C7B09 Secondary carcinoid tumors of other sites: Secondary | ICD-10-CM | POA: Diagnosis not present

## 2018-04-18 DIAGNOSIS — K8020 Calculus of gallbladder without cholecystitis without obstruction: Secondary | ICD-10-CM | POA: Diagnosis not present

## 2018-04-18 DIAGNOSIS — Z86711 Personal history of pulmonary embolism: Secondary | ICD-10-CM | POA: Insufficient documentation

## 2018-04-18 DIAGNOSIS — Z79899 Other long term (current) drug therapy: Secondary | ICD-10-CM | POA: Diagnosis not present

## 2018-04-18 DIAGNOSIS — C7802 Secondary malignant neoplasm of left lung: Secondary | ICD-10-CM | POA: Insufficient documentation

## 2018-04-18 DIAGNOSIS — C7801 Secondary malignant neoplasm of right lung: Secondary | ICD-10-CM | POA: Diagnosis not present

## 2018-04-18 DIAGNOSIS — I10 Essential (primary) hypertension: Secondary | ICD-10-CM | POA: Insufficient documentation

## 2018-04-18 DIAGNOSIS — I2510 Atherosclerotic heart disease of native coronary artery without angina pectoris: Secondary | ICD-10-CM | POA: Diagnosis not present

## 2018-04-18 DIAGNOSIS — I77810 Thoracic aortic ectasia: Secondary | ICD-10-CM | POA: Diagnosis not present

## 2018-04-18 DIAGNOSIS — C229 Malignant neoplasm of liver, not specified as primary or secondary: Secondary | ICD-10-CM | POA: Diagnosis not present

## 2018-04-18 DIAGNOSIS — C7B02 Secondary carcinoid tumors of liver: Secondary | ICD-10-CM | POA: Insufficient documentation

## 2018-04-18 DIAGNOSIS — D449 Neoplasm of uncertain behavior of unspecified endocrine gland: Secondary | ICD-10-CM | POA: Diagnosis not present

## 2018-04-18 LAB — CBC WITH DIFFERENTIAL (CANCER CENTER ONLY)
BASOS ABS: 0 10*3/uL (ref 0.0–0.1)
Basophils Relative: 1 %
EOS PCT: 5 %
Eosinophils Absolute: 0.1 10*3/uL (ref 0.0–0.5)
HCT: 35.4 % (ref 34.8–46.6)
HEMOGLOBIN: 11.8 g/dL (ref 11.6–15.9)
LYMPHS ABS: 0.7 10*3/uL — AB (ref 0.9–3.3)
LYMPHS PCT: 25 %
MCH: 34.8 pg — AB (ref 25.1–34.0)
MCHC: 33.4 g/dL (ref 31.5–36.0)
MCV: 104.1 fL — ABNORMAL HIGH (ref 79.5–101.0)
Monocytes Absolute: 0.4 10*3/uL (ref 0.1–0.9)
Monocytes Relative: 14 %
NEUTROS PCT: 55 %
Neutro Abs: 1.5 10*3/uL (ref 1.5–6.5)
PLATELETS: 112 10*3/uL — AB (ref 145–400)
RBC: 3.41 MIL/uL — AB (ref 3.70–5.45)
RDW: 18 % — ABNORMAL HIGH (ref 11.2–14.5)
WBC: 2.7 10*3/uL — AB (ref 3.9–10.3)

## 2018-04-18 LAB — CMP (CANCER CENTER ONLY)
ALT: 13 U/L (ref 0–44)
ANION GAP: 12 (ref 5–15)
AST: 24 U/L (ref 15–41)
Albumin: 3.6 g/dL (ref 3.5–5.0)
Alkaline Phosphatase: 80 U/L (ref 38–126)
BUN: 12 mg/dL (ref 8–23)
CO2: 24 mmol/L (ref 22–32)
Calcium: 9.9 mg/dL (ref 8.9–10.3)
Chloride: 98 mmol/L (ref 98–111)
Creatinine: 0.88 mg/dL (ref 0.44–1.00)
GFR, EST NON AFRICAN AMERICAN: 60 mL/min — AB (ref >60)
Glucose, Bld: 101 mg/dL — ABNORMAL HIGH (ref 70–99)
POTASSIUM: 4.7 mmol/L (ref 3.5–5.1)
Sodium: 134 mmol/L — ABNORMAL LOW (ref 135–145)
TOTAL PROTEIN: 6.5 g/dL (ref 6.5–8.1)
Total Bilirubin: 0.7 mg/dL (ref 0.3–1.2)

## 2018-04-18 MED ORDER — IOHEXOL 300 MG/ML  SOLN
100.0000 mL | Freq: Once | INTRAMUSCULAR | Status: AC | PRN
  Administered 2018-04-18: 100 mL via INTRAVENOUS

## 2018-04-21 ENCOUNTER — Telehealth: Payer: Self-pay | Admitting: *Deleted

## 2018-04-21 NOTE — Telephone Encounter (Signed)
Spoke with patient's husband (DPR) regarding 5 seconds pause episode on 04/10/18 at 23:55. Patient's husband states patient was asleep at the time and is aware to call if she has a syncopal episodes. Patient's husband states patient goes to bed around 10 pm and wakes up around 10 am every morning. Advised we will continue to monitor. Patient's husband verbalized understanding.

## 2018-04-22 ENCOUNTER — Encounter: Payer: Self-pay | Admitting: Internal Medicine

## 2018-04-22 ENCOUNTER — Telehealth: Payer: Self-pay | Admitting: Pediatrics

## 2018-04-22 ENCOUNTER — Telehealth: Payer: Self-pay | Admitting: Internal Medicine

## 2018-04-22 ENCOUNTER — Inpatient Hospital Stay (HOSPITAL_BASED_OUTPATIENT_CLINIC_OR_DEPARTMENT_OTHER): Payer: Medicare Other | Admitting: Internal Medicine

## 2018-04-22 VITALS — BP 170/52 | HR 56 | Temp 97.6°F | Resp 18 | Ht 64.0 in | Wt 234.6 lb

## 2018-04-22 DIAGNOSIS — R42 Dizziness and giddiness: Secondary | ICD-10-CM

## 2018-04-22 DIAGNOSIS — C7A8 Other malignant neuroendocrine tumors: Secondary | ICD-10-CM

## 2018-04-22 DIAGNOSIS — C7B02 Secondary carcinoid tumors of liver: Secondary | ICD-10-CM

## 2018-04-22 DIAGNOSIS — Z86711 Personal history of pulmonary embolism: Secondary | ICD-10-CM | POA: Diagnosis not present

## 2018-04-22 DIAGNOSIS — C7B09 Secondary carcinoid tumors of other sites: Secondary | ICD-10-CM | POA: Diagnosis not present

## 2018-04-22 DIAGNOSIS — Z79899 Other long term (current) drug therapy: Secondary | ICD-10-CM | POA: Diagnosis not present

## 2018-04-22 DIAGNOSIS — C787 Secondary malignant neoplasm of liver and intrahepatic bile duct: Secondary | ICD-10-CM

## 2018-04-22 DIAGNOSIS — I10 Essential (primary) hypertension: Secondary | ICD-10-CM

## 2018-04-22 DIAGNOSIS — C7B8 Other secondary neuroendocrine tumors: Principal | ICD-10-CM

## 2018-04-22 NOTE — Telephone Encounter (Signed)
That heart rate is too low, pt needs to be seen ASAP. I can see her today. Her HR was 79 when she was at the cardiologist on 9/16.

## 2018-04-22 NOTE — Telephone Encounter (Signed)
LM [id number]-jhb

## 2018-04-22 NOTE — Telephone Encounter (Signed)
Returned patient's phone call. Patient complains of abdominal urination, urinary urgency and burning with urination for 3 days.  Patient would like to have medication sent pharmacy.   Patient also states that her pulse had been in the high 30s to mid 40s for the last 2 weeks with weakness.

## 2018-04-22 NOTE — Telephone Encounter (Signed)
Spoke with patient's daughter.  Daughter states that patient is on her way to see her oncologist.  Informed daughter that HR is to low.  Daughter verbalized understanding and states oncologist should check patient's HR.  Will try to contact oncologist and make aware

## 2018-04-22 NOTE — Progress Notes (Signed)
[facility] [phone]   [phone]  OFFICE PROGRESS NOTE  [name], [name] [address]  DIAGNOSIS:  1) Metastatic intermediate. Neuroendocrine tumor of lung primary diagnosed in January 2018 and presented with small bilateral pulmonary nodules in addition to multiple liver metastasis. 2) right lower lobe pulmonary embolism diagnosed incidentally on CT scan of the chest on 04/23/2017  PRIOR THERAPY:  1) Status post radio embolization with Y 90 to the liver lesions by interventional radiology. 2) status post IVC filter placement by interventional radiology on 04/24/2017  CURRENT THERAPY: Xeloda 750 MG/M2 twice a day days 1-14 and Temodar 150 MG/M2 days 10-14 every 4 weeks. Status post 22 cycles. She will start cycle #23 on May 04, 2018.  INTERVAL HISTORY: [name] 84 y.o. female returns to the clinic today for follow-up visit accompanied by her husband.  The patient continues to have increasing fatigue and weakness as well as a swelling of her lower extremities.  She is currently on Lasix by her primary care physician.  She has dizzy spells but she is on multiple medications that could contribute to her dizzy spells including amitriptyline, Lexapro and gabapentin.  She has been tolerating her treatment with Temodar and Xeloda fairly well.  She denied having any nausea, vomiting, diarrhea or constipation.  She has no chest pain but she has occasional shortness of breath with no cough or hemoptysis.  She denied having any weight loss or night sweats.  The patient had a repeat CT scan of the chest, abdomen and pelvis performed recently and she is here for evaluation and discussion of her risk her results.  MEDICAL HISTORY: Past Medical History:  Diagnosis Date  . Acute respiratory failure with hypoxia ([facility]) 10/23/2015  . Allergic rhinitis    PT. DENIES  . Anxiety   . Arthritis    NECK  . Ataxia   . Bradycardia      primarily nocturnal  . Burning tongue syndrome 25 years  . Cancer ([facility]) dx'd 06/2016   liver  . Cataract   . Cerebellar degeneration   . Chronic pericarditis   . Chronic urinary tract infection   . Complication of anesthesia    low o2 sats, coded 30 years ago  . CVA (cerebral infarction) 05/2003  . Depression   . Encounter for antineoplastic chemotherapy 10/09/2016  . Gait disorder   . Gastric polyps   . GERD (gastroesophageal reflux disease)   . Goals of care, counseling/discussion 10/09/2016  . High cholesterol   . Hyperlipidemia   . Hypotension   . Hypothyroidism   . IBS (irritable bowel syndrome)   . Obesity   . Paroxysmal atrial fibrillation ([name])    chads2vasc score is 6,  she is felt to be a poor candidate for anticoagulation  . Pericarditis   . Personal history of arterial venous malformation (AVM)    right side of face  . Seizure disorder ([facility])   . Seizures ([place]) 2003   " smelling"- Gabapentin "no problem"  . Shortness of breath dyspnea    with exertion  . Sternum fx 10/27/2013  . Stroke [facility]) 5 years ago   Right side of face weak, slurred speach-   . Thyroid disease   . TIA (transient ischemic attack)   . UTI (lower urinary tract infection) 03/27/2016   "frequently"    ALLERGIES:  is allergic to lisinopril.  MEDICATIONS:  Current Outpatient Medications  Medication Sig  Dispense Refill  . ALPRAZolam (XANAX) 0.25 MG tablet Take 1 tablet (0.25 mg total) by mouth 2 (two) times daily as needed for anxiety. 30 tablet 1  . amitriptyline (ELAVIL) 25 MG tablet Take 1 tablet (25 mg total) by mouth at bedtime. 90 tablet 1  . capecitabine (XELODA) 500 MG tablet TAKE 3 TABLETS (1,500 MG TOTAL) BY MOUTH 2 (TWO) TIMES DAILY AFTER A MEAL. (Patient taking differently: Take 1,500 mg by mouth See admin instructions. TAKE 3 TABLETS (1,500 MG TOTAL) BY MOUTH 2 (TWO) TIMES DAILY AFTER A MEAL FOR 14 DAYS AS DIRECTED) 84 tablet 2  . escitalopram (LEXAPRO) 20 MG tablet Take 1 tablet  (20 mg total) by mouth at bedtime. 90 tablet 1  . fluticasone (FLONASE) 50 MCG/ACT nasal spray Place 2 sprays into both nostrils at bedtime. 16 g 3  . furosemide (LASIX) 20 MG tablet TAKE 1 TABLET DAILY AS NEEDED FOR FLUID RETENTION (Patient taking differently: Take 20 mg by mouth as needed for fluid or edema. TAKE 1 TABLET DAILY AS NEEDED FOR FLUID RETENTION) 90 tablet 0  . gabapentin (NEURONTIN) 400 MG capsule Take 1-2 capsules (400-800 mg total) by mouth 3 (three) times daily. (Patient taking differently: Take 800 mg by mouth 2 (two) times daily. ) 720 capsule 1  . hydroxypropyl methylcellulose / hypromellose (ISOPTO TEARS / GONIOVISC) 2.5 % ophthalmic solution Place 1 drop into both eyes as needed.     Marland Kitchen levothyroxine (SYNTHROID, LEVOTHROID) 75 MCG tablet TAKE 1 TABLET DAILY BEFORE BREAKFAST (Patient taking differently: Take 75 mcg by mouth daily before breakfast. ) 90 tablet 1  . lovastatin (MEVACOR) 40 MG tablet Take 1 tablet (40 mg total) by mouth at bedtime. 90 tablet 1  . Melatonin 3 MG TBDP Take 3-6 mg by mouth at bedtime as needed. (Patient taking differently: Take 3-6 mg by mouth at bedtime as needed (FOR SLEEP). ) 60 tablet 1  . omeprazole (PRILOSEC) 40 MG capsule Take 1 capsule (40 mg total) by mouth daily. 90 capsule 1  . ondansetron (ZOFRAN) 8 MG tablet Take 1 tablet (8 mg total) by mouth every 8 (eight) hours as needed for nausea or vomiting. 90 tablet 1  . OXcarbazepine (TRILEPTAL) 150 MG tablet Take 1 tablet (150 mg total) by mouth 2 (two) times daily. 60 tablet 3  . oxyCODONE-acetaminophen (PERCOCET) 5-325 MG tablet Take 1 tablet by mouth every 8 (eight) hours as needed for severe pain. 10 tablet 0  . rivaroxaban (XARELTO) 20 MG TABS tablet Take 1 tablet (20 mg total) by mouth daily with supper.    . temozolomide (TEMODAR) 140 MG capsule TAKE 2 CAPSULES BY MOUTH DAILY. MAY TAKE ON AN EMPTY STOMACH OR AT BEDTIME TO DECREASE NAUSEA AND VOMITING (Patient taking differently: Take 280 mg  by mouth daily. TAKE 2 CAPSULES BY MOUTH DAILY. MAY TAKE ON AN EMPTY STOMACH OR AT BEDTIME TO DECREASE NAUSEA AND VOMITING *TO START 5 DAYS PRIOR TO COMPLETION OF 14 DAY COURSE OF XELODA REGIMEN AS DIRECTED*) 10 capsule 2   No current facility-administered medications for this visit.     SURGICAL HISTORY:  Past Surgical History:  Procedure Laterality Date  . APPENDECTOMY  84 years old  . COLONOSCOPY  2006, 2009  . COLONOSCOPY WITH PROPOFOL N/A 03/28/2016   Procedure: COLONOSCOPY WITH PROPOFOL;  Surgeon: Gatha Mayer, MD;  Location: [facility];  Service: Endoscopy;  Laterality: N/A;  . cyst removed  35 years ago  . EP IMPLANTABLE DEVICE N/A 01/06/2015  Procedure: Loop Recorder Insertion;  Surgeon: Thompson Grayer, MD;  Location: [facility];  Service: Cardiovascular;  Laterality: N/A;  . EYE SURGERY Right    Cataract  . IR ANGIOGRAM SELECTIVE EACH ADDITIONAL VESSEL  11/28/2016  . IR ANGIOGRAM SELECTIVE EACH ADDITIONAL VESSEL  11/28/2016  . IR ANGIOGRAM SELECTIVE EACH ADDITIONAL VESSEL  11/28/2016  . IR ANGIOGRAM SELECTIVE EACH ADDITIONAL VESSEL  11/28/2016  . IR ANGIOGRAM SELECTIVE EACH ADDITIONAL VESSEL  12/13/2016  . IR ANGIOGRAM SELECTIVE EACH ADDITIONAL VESSEL  12/13/2016  . IR ANGIOGRAM SELECTIVE EACH ADDITIONAL VESSEL  01/09/2017  . IR ANGIOGRAM VISCERAL SELECTIVE  11/28/2016  . IR ANGIOGRAM VISCERAL SELECTIVE  11/28/2016  . IR ANGIOGRAM VISCERAL SELECTIVE  12/13/2016  . IR ANGIOGRAM VISCERAL SELECTIVE  12/13/2016  . IR ANGIOGRAM VISCERAL SELECTIVE  01/09/2017  . IR EMBO ARTERIAL NOT HEMORR HEMANG INC GUIDE ROADMAPPING  11/28/2016  . IR EMBO TUMOR ORGAN ISCHEMIA INFARCT INC GUIDE ROADMAPPING  12/13/2016  . IR EMBO TUMOR ORGAN ISCHEMIA INFARCT INC GUIDE ROADMAPPING  01/09/2017  . IR IVC FILTER PLMT / S&I /IMG GUID/MOD SED  04/24/2017  . IR RADIOLOGIST EVAL & MGMT  11/06/2016  . IR RADIOLOGIST EVAL & MGMT  01/02/2017  . IR RADIOLOGIST EVAL & MGMT  02/05/2017  . IR RADIOLOGIST EVAL & MGMT  05/02/2017   . IR RADIOLOGIST EVAL & MGMT  10/17/2017  . IR US GUIDE VASC ACCESS RIGHT  11/28/2016  . IR US GUIDE VASC ACCESS RIGHT  12/13/2016  . IR US GUIDE VASC ACCESS RIGHT  01/09/2017  . KNEE ARTHROSCOPY Right 11/14/2006  . KNEE ARTHROSCOPY Bilateral 5 and 6 years ago  . KNEE ARTHROSCOPY WITH LATERAL MENISECTOMY  07/03/2012   Procedure: KNEE ARTHROSCOPY WITH LATERAL MENISECTOMY;  Surgeon: Magnus Sinning, MD;  Location: [facility];  Service: Orthopedics;  Laterality: Left;  with Partial Lateral Menisectomy and Medial Menisectomy. Shaving of medial and lateral femoral condyles. Shaving of patella. Removal of a loose body  . tibial and fibular internal fixation Left   . TOTAL ABDOMINAL HYSTERECTOMY  84 years old  . UPPER GASTROINTESTINAL ENDOSCOPY  2009, 2013    REVIEW OF SYSTEMS:  Constitutional: positive for fatigue Eyes: negative Ears, nose, mouth, throat, and face: negative Respiratory: positive for dyspnea on exertion Cardiovascular: negative Gastrointestinal: negative Genitourinary:negative Integument/breast: negative Hematologic/lymphatic: negative Musculoskeletal:positive for arthralgias and muscle weakness Neurological: positive for dizziness Behavioral/Psych: negative Endocrine: negative Allergic/Immunologic: negative   PHYSICAL EXAMINATION: General appearance: alert, cooperative, fatigued and no distress Head: Normocephalic, without obvious abnormality, atraumatic Neck: no adenopathy, no JVD, supple, symmetrical, trachea midline and thyroid not enlarged, symmetric, no tenderness/mass/nodules Lymph nodes: Cervical, supraclavicular, and axillary nodes normal. Resp: clear to auscultation bilaterally Back: symmetric, no curvature. ROM normal. No CVA tenderness. Cardio: regular rate and rhythm, S1, S2 normal, no murmur, click, rub or gallop GI: soft, non-tender; bowel sounds normal; no masses,  no organomegaly Extremities: extremities normal, atraumatic, no cyanosis or edema Neurologic:  Alert and oriented X 3, normal strength and tone. Normal symmetric reflexes. Normal coordination and gait  ECOG PERFORMANCE STATUS: 1 - Symptomatic but completely ambulatory  Blood pressure (!) 170/52, pulse (!) 56, temperature 97.6 F (36.4 C), temperature source Oral, resp. rate 18, height 5\' 4"  (1.626 m), weight 234 lb 9.6 oz (106.4 kg), SpO2 98 %.  LABORATORY DATA: Lab Results  Component Value Date   WBC 2.7 (L) 04/18/2018   HGB 11.8 04/18/2018   HCT 35.4 04/18/2018   MCV 104.1 (H) 04/18/2018  PLT 112 (L) 04/18/2018      Chemistry      Component Value Date/Time   NA 134 (L) 04/18/2018 1525   NA 136 04/14/2018 1617   NA 135 (L) 07/10/2017 1142   K 4.7 04/18/2018 1525   K 4.6 07/10/2017 1142   CL 98 04/18/2018 1525   CO2 24 04/18/2018 1525   CO2 25 07/10/2017 1142   BUN 12 04/18/2018 1525   BUN 14 04/14/2018 1617   BUN 11.4 07/10/2017 1142   CREATININE 0.88 04/18/2018 1525   CREATININE 1.0 07/10/2017 1142      Component Value Date/Time   CALCIUM 9.9 04/18/2018 1525   CALCIUM 9.9 07/10/2017 1142   ALKPHOS 80 04/18/2018 1525   ALKPHOS 93 07/10/2017 1142   AST 24 04/18/2018 1525   AST 43 (H) 07/10/2017 1142   ALT 13 04/18/2018 1525   ALT 28 07/10/2017 1142   BILITOT 0.7 04/18/2018 1525   BILITOT 0.56 07/10/2017 1142       RADIOGRAPHIC STUDIES: Ct Chest W Contrast  Result Date: 04/18/2018 CLINICAL DATA:  Neuroendocrine tumor of the GI tract. Carcinoid syndrome. Known metastasis. Neuroendocrine lung primary. On oral chemotherapy. Asymptomatic. Status post Y 90 embolization. EXAM: CT CHEST, ABDOMEN, AND PELVIS WITH CONTRAST TECHNIQUE: Multidetector CT imaging of the chest, abdomen and pelvis was performed following the standard protocol during bolus administration of intravenous contrast. CONTRAST:  120mL OMNIPAQUE IOHEXOL 300 MG/ML  SOLN COMPARISON:  12/30/2017 and interval chest CT 02/05/2018. FINDINGS: CT CHEST FINDINGS Cardiovascular: Aortic and branch vessel  atherosclerosis. Ascending aortic dilatation again identified at 4.1 cm. No dissection. Tortuous thoracic aorta. Mild cardiomegaly, without pericardial effusion. Multivessel coronary artery atherosclerosis. No central pulmonary embolism, on this non-dedicated study. Mediastinum/Nodes: No supraclavicular adenopathy. No mediastinal or hilar adenopathy. Lungs/Pleura: No pleural fluid. Bilateral pulmonary nodules are again identified. The largest nodule is in the right upper lobe and measures 1.3 x 1.1 cm on image 45/6. Compare 1.2 x 1.1 cm on the prior. Nodule along the left major fissure measures 5 mm today versus 4 mm on the prior. Right upper lobe 5 mm nodule on image 34/6 measured 4 mm previously. Other smaller nodules are identified on series 6 and are felt to be similar Musculoskeletal: No acute osseous abnormality. Remote sternal body fracture, similar. CT ABDOMEN PELVIS FINDINGS Hepatobiliary: Hepatic morphology of cirrhosis, possibly treatment related. Segment 8 liver lesion measures 1.3 x 1.4 cm on image 42/2 versus 1.2 x 1.4 cm on the prior. Segment 3 lesion measures 2.6 x 2.6 cm on image 49/2 versus 1.8 x 1.7 cm on the prior. No new lesions identified. Multiple gallstones without acute cholecystitis or biliary duct dilatation. Pancreas: Normal, without mass or ductal dilatation. Spleen: Normal in size, without focal abnormality. Adrenals/Urinary Tract: Normal adrenal glands. Interpolar right renal too small to characterize lesion. Normal left kidney. No hydronephrosis. Normal urinary bladder. Stomach/Bowel: Normal stomach, without wall thickening. Normal colon and terminal ileum. Normal small bowel. Vascular/Lymphatic: Aortic and branch vessel atherosclerosis. No abdominopelvic adenopathy. Reproductive: Hysterectomy.  No adnexal mass. Other: No significant free fluid. No evidence of omental or peritoneal disease. Musculoskeletal: Advanced lumbosacral spondylosis.  Mild osteopenia. IMPRESSION: 1. Mild  progression of hepatic metastasis, as evidenced by enlargement of a segment 3 lesion. 2. Similar to minimal progression of pulmonary metastasis, as detailed above. 3. No new sites of metastatic disease. 4. Coronary artery atherosclerosis. Aortic Atherosclerosis (ICD10-I70.0). 5. Similar ascending aortic dilatation. 6. Cholelithiasis. Electronically Signed   By: Adria Devon.D.  On: 04/18/2018 17:05   Ct Abdomen Pelvis W Contrast  Result Date: 04/18/2018 CLINICAL DATA:  Neuroendocrine tumor of the GI tract. Carcinoid syndrome. Known metastasis. Neuroendocrine lung primary. On oral chemotherapy. Asymptomatic. Status post Y 90 embolization. EXAM: CT CHEST, ABDOMEN, AND PELVIS WITH CONTRAST TECHNIQUE: Multidetector CT imaging of the chest, abdomen and pelvis was performed following the standard protocol during bolus administration of intravenous contrast. CONTRAST:  145mL OMNIPAQUE IOHEXOL 300 MG/ML  SOLN COMPARISON:  12/30/2017 and interval chest CT 02/05/2018. FINDINGS: CT CHEST FINDINGS Cardiovascular: Aortic and branch vessel atherosclerosis. Ascending aortic dilatation again identified at 4.1 cm. No dissection. Tortuous thoracic aorta. Mild cardiomegaly, without pericardial effusion. Multivessel coronary artery atherosclerosis. No central pulmonary embolism, on this non-dedicated study. Mediastinum/Nodes: No supraclavicular adenopathy. No mediastinal or hilar adenopathy. Lungs/Pleura: No pleural fluid. Bilateral pulmonary nodules are again identified. The largest nodule is in the right upper lobe and measures 1.3 x 1.1 cm on image 45/6. Compare 1.2 x 1.1 cm on the prior. Nodule along the left major fissure measures 5 mm today versus 4 mm on the prior. Right upper lobe 5 mm nodule on image 34/6 measured 4 mm previously. Other smaller nodules are identified on series 6 and are felt to be similar Musculoskeletal: No acute osseous abnormality. Remote sternal body fracture, similar. CT ABDOMEN PELVIS FINDINGS  Hepatobiliary: Hepatic morphology of cirrhosis, possibly treatment related. Segment 8 liver lesion measures 1.3 x 1.4 cm on image 42/2 versus 1.2 x 1.4 cm on the prior. Segment 3 lesion measures 2.6 x 2.6 cm on image 49/2 versus 1.8 x 1.7 cm on the prior. No new lesions identified. Multiple gallstones without acute cholecystitis or biliary duct dilatation. Pancreas: Normal, without mass or ductal dilatation. Spleen: Normal in size, without focal abnormality. Adrenals/Urinary Tract: Normal adrenal glands. Interpolar right renal too small to characterize lesion. Normal left kidney. No hydronephrosis. Normal urinary bladder. Stomach/Bowel: Normal stomach, without wall thickening. Normal colon and terminal ileum. Normal small bowel. Vascular/Lymphatic: Aortic and branch vessel atherosclerosis. No abdominopelvic adenopathy. Reproductive: Hysterectomy.  No adnexal mass. Other: No significant free fluid. No evidence of omental or peritoneal disease. Musculoskeletal: Advanced lumbosacral spondylosis.  Mild osteopenia. IMPRESSION: 1. Mild progression of hepatic metastasis, as evidenced by enlargement of a segment 3 lesion. 2. Similar to minimal progression of pulmonary metastasis, as detailed above. 3. No new sites of metastatic disease. 4. Coronary artery atherosclerosis. Aortic Atherosclerosis (ICD10-I70.0). 5. Similar ascending aortic dilatation. 6. Cholelithiasis. Electronically Signed   By: Abigail Miyamoto M.D.   On: 04/18/2018 17:05    ASSESSMENT AND PLAN:  This is a very pleasant 84 years old white female with metastatic intermediate grade neuroendocrine carcinoma of questionable lung primary and multiple metastatic liver lesions and pancreatic lesions.She status post treatment with radio embolization with Y90 to the left and right lobe liver lesions.Status post treatment with Y 90 to the left and right lobes of the liver. She is currently undergoing systemic chemotherapy with Xeloda and Temodar status post 22  cycles. She has been tolerating her treatment well with no concerning complaints except for the increasing fatigue recently.  She also has dizzy spells but this could be medication induced especially the patient is on multiple medications that can cause her to have dizziness including Neurontin, amitriptyline and Lexapro. She had repeat CT scan of the chest, abdomen and pelvis performed recently.  I personally and independently reviewed the scans and discussed the results with the patient and her husband today.  Her scan showed very mild  disease progression in the liver and the pulmonary metastasis. I recommended for the patient to continue her current treatment with Xeloda and Temodar for now. I will continue to monitor her disease closely and if she continues to have further disease progression, I will send her for nuclear medicine consult for consideration of treatment with Lutathera. For hypertension, the patient is currently on Lasix but no other blood pressure medication.  I recommended for her to discuss with her primary care physician adjustment of her blood pressure medications. She will come back for follow-up visit in 1 months for evaluation with repeat blood work. The patient was advised to call immediately if she has any concerning symptoms in the interval. The patient voices understanding of current disease status and treatment options and is in agreement with the current care plan. All questions were answered. The patient knows to call the clinic with any problems, questions or concerns. We can certainly see the patient much sooner if necessary.  Disclaimer: This note was dictated with voice recognition software. Similar sounding words can inadvertently be transcribed and may not be corrected upon review.

## 2018-04-22 NOTE — Telephone Encounter (Signed)
Appts scheduled AVS/Calendar printed per 9/24 los

## 2018-04-22 NOTE — Telephone Encounter (Signed)
Urine frequency Burning Urgency  Last seen 9/6 - Dr Evette Doffing  Can she have a med called in to [facility]

## 2018-04-22 NOTE — Telephone Encounter (Signed)
Multiple attempts made to contact patient regarding heart rate.  She needs to be seen ASAP.

## 2018-04-23 ENCOUNTER — Encounter: Payer: Self-pay | Admitting: Pharmacist

## 2018-04-23 ENCOUNTER — Other Ambulatory Visit: Payer: Self-pay | Admitting: Pharmacist

## 2018-04-23 ENCOUNTER — Encounter: Payer: Self-pay | Admitting: Pediatrics

## 2018-04-23 ENCOUNTER — Ambulatory Visit (INDEPENDENT_AMBULATORY_CARE_PROVIDER_SITE_OTHER): Payer: Medicare Other | Admitting: Pediatrics

## 2018-04-23 VITALS — BP 169/60 | HR 49 | Temp 96.8°F

## 2018-04-23 DIAGNOSIS — C7A8 Other malignant neuroendocrine tumors: Secondary | ICD-10-CM

## 2018-04-23 DIAGNOSIS — D696 Thrombocytopenia, unspecified: Secondary | ICD-10-CM | POA: Diagnosis not present

## 2018-04-23 DIAGNOSIS — Z5111 Encounter for antineoplastic chemotherapy: Secondary | ICD-10-CM

## 2018-04-23 DIAGNOSIS — R339 Retention of urine, unspecified: Secondary | ICD-10-CM | POA: Diagnosis not present

## 2018-04-23 DIAGNOSIS — R300 Dysuria: Secondary | ICD-10-CM

## 2018-04-23 DIAGNOSIS — R001 Bradycardia, unspecified: Secondary | ICD-10-CM

## 2018-04-23 DIAGNOSIS — C787 Secondary malignant neoplasm of liver and intrahepatic bile duct: Secondary | ICD-10-CM | POA: Diagnosis not present

## 2018-04-23 DIAGNOSIS — N3090 Cystitis, unspecified without hematuria: Secondary | ICD-10-CM

## 2018-04-23 DIAGNOSIS — C3490 Malignant neoplasm of unspecified part of unspecified bronchus or lung: Secondary | ICD-10-CM | POA: Diagnosis not present

## 2018-04-23 DIAGNOSIS — Z23 Encounter for immunization: Secondary | ICD-10-CM

## 2018-04-23 DIAGNOSIS — C259 Malignant neoplasm of pancreas, unspecified: Secondary | ICD-10-CM | POA: Diagnosis not present

## 2018-04-23 DIAGNOSIS — I309 Acute pericarditis, unspecified: Secondary | ICD-10-CM | POA: Diagnosis not present

## 2018-04-23 DIAGNOSIS — I10 Essential (primary) hypertension: Secondary | ICD-10-CM

## 2018-04-23 LAB — URINALYSIS, COMPLETE
Bilirubin, UA: NEGATIVE
Glucose, UA: NEGATIVE
KETONES UA: NEGATIVE
NITRITE UA: POSITIVE — AB
Specific Gravity, UA: 1.015 (ref 1.005–1.030)
Urobilinogen, Ur: 2 mg/dL — ABNORMAL HIGH (ref 0.2–1.0)
pH, UA: 8 — ABNORMAL HIGH (ref 5.0–7.5)

## 2018-04-23 LAB — MICROSCOPIC EXAMINATION
Renal Epithel, UA: NONE SEEN /hpf
WBC, UA: >30 /hpf — AB (ref 0–5)

## 2018-04-23 MED ORDER — CAPECITABINE 500 MG PO TABS: ORAL_TABLET | ORAL | 2 refills | Status: DC

## 2018-04-23 MED ORDER — NITROFURANTOIN MONOHYD MACRO 100 MG PO CAPS: 100.0000 mg | ORAL_CAPSULE | Freq: Two times a day (BID) | ORAL | 0 refills | Status: DC

## 2018-04-23 MED ORDER — TEMOZOLOMIDE 140 MG PO CAPS: ORAL_CAPSULE | ORAL | 2 refills | Status: DC

## 2018-04-23 NOTE — Addendum Note (Signed)
Addended by: Pollyann Kennedy F on: 04/23/2018 04:34 PM   Modules accepted: Orders

## 2018-04-23 NOTE — Progress Notes (Signed)
Subjective:   Patient ID: [name], female    DOB: Jun 30, 1934, 84 y.o.   MRN: [id number] CC: Urinary Tract Infection (dysuria when she finishes, back pain lower back, getting up about q2 hours at night) and Bradycardia (when goes to dr's office lately been in 60 - 33, health nurse was there this morning pulse was about 50 then she counted it was 40ish, not felt well since she went to hospital)  HPI: [name] is a 84 y.o. female   Recently treated for pericarditis. Has been feeling tired, fatigued, weak more so than usual over the last 1 to 2 weeks.  She been checking her heart rate at home, getting numbers in the upper 30s, low 40s.  She says the highest she is seen at the last couple weeks has been 57-59.  Her heart rate at the cardiologist's office on 9/16 was 79.  She feels like her memory has been a little bit off the last few days.  She took a Lasix this morning because of increased swelling in her feet which she has to do intermittently.  She has a loop recorder.  She says Dr. Jackalyn Lombard office called her a couple times saying that she is having pauses with her heart.   She started having burning with urination.  Has had some lower abdominal spasms and on since that pain started.  She is not able to leave a urine sample today.  Relevant past medical, surgical, family and social history reviewed. Allergies and medications reviewed and updated. Social History   Tobacco Use  Smoking Status Former Smoker  . Packs/day: 0.25  . Years: 10.00  . Pack years: 2.50  . Types: Cigarettes  . Last attempt to quit: 07/31/1975  . Years since quitting: 42.7  Smokeless Tobacco Never Used   ROS: Per HPI   Objective:    BP (!) 169/60   Pulse (!) 49   Temp (!) 96.8 F (36 C) (Oral)   Wt Readings from Last 3 Encounters:  04/22/18 234 lb 9.6 oz (106.4 kg)  04/14/18 227 lb (103 kg)  04/04/18 232 lb 6.4 oz (105.4 kg)   Gen: NAD, alert, cooperative with exam, NCAT EYES: EOMI, no  conjunctival injection, or no icterus CV: NRRR, normal S1/S2, no murmur, distal pulses 2+ b/l Resp: CTABL, no wheezes, normal WOB Abd: +BS, soft, NTND. Ext: 1+ pitting edema feet b/l, warm Neuro: Alert and oriented MSK: normal muscle bulk  Assessment & Plan:  Lottie was seen today for urinary tract infection and bradycardia.  Diagnoses and all orders for this visit:  Dysuria -     Urinalysis, Complete  Bradycardia Heart rate 40s today sinus rhythm on EKG.  Any worsening in symptoms patient is to go to the emergency room.  Call Dr. Jackalyn Lombard office, she has an appointment to be seen Friday morning.  Return precautions discussed at length. -     EKG 12-Lead -     Ambulatory referral to Cardiology  Cystitis Will treat based on symptoms, patient's family going to bring urine sample back this afternoon so we can send for culture. -     nitrofurantoin, macrocrystal-monohydrate, (MACROBID) 100 MG capsule; Take 1 capsule (100 mg total) by mouth 2 (two) times daily for 5 days. -     Urine Culture; Future  Encounter for immunization -     Flu vaccine HIGH DOSE PF   Follow up plan: Return in about 4 weeks (around 05/21/2018). Assunta Found, MD Tristan Schroeder [facility]

## 2018-04-23 NOTE — Telephone Encounter (Signed)
Has appt with me 1230

## 2018-04-23 NOTE — Telephone Encounter (Signed)
Seen by provider today

## 2018-04-25 ENCOUNTER — Other Ambulatory Visit: Payer: Self-pay | Admitting: Physician Assistant

## 2018-04-25 ENCOUNTER — Encounter: Payer: Self-pay | Admitting: Physician Assistant

## 2018-04-25 ENCOUNTER — Ambulatory Visit (INDEPENDENT_AMBULATORY_CARE_PROVIDER_SITE_OTHER): Payer: Medicare Other | Admitting: Physician Assistant

## 2018-04-25 ENCOUNTER — Other Ambulatory Visit: Payer: Self-pay | Admitting: Pediatrics

## 2018-04-25 VITALS — BP 132/66 | HR 51 | Ht 64.0 in | Wt 234.0 lb

## 2018-04-25 DIAGNOSIS — I300 Acute nonspecific idiopathic pericarditis: Secondary | ICD-10-CM

## 2018-04-25 DIAGNOSIS — C7A8 Other malignant neuroendocrine tumors: Secondary | ICD-10-CM

## 2018-04-25 DIAGNOSIS — C7B8 Other secondary neuroendocrine tumors: Secondary | ICD-10-CM

## 2018-04-25 DIAGNOSIS — R079 Chest pain, unspecified: Secondary | ICD-10-CM | POA: Diagnosis not present

## 2018-04-25 DIAGNOSIS — I480 Paroxysmal atrial fibrillation: Secondary | ICD-10-CM

## 2018-04-25 DIAGNOSIS — R339 Retention of urine, unspecified: Secondary | ICD-10-CM | POA: Diagnosis not present

## 2018-04-25 DIAGNOSIS — C259 Malignant neoplasm of pancreas, unspecified: Secondary | ICD-10-CM | POA: Diagnosis not present

## 2018-04-25 DIAGNOSIS — C787 Secondary malignant neoplasm of liver and intrahepatic bile duct: Secondary | ICD-10-CM | POA: Diagnosis not present

## 2018-04-25 DIAGNOSIS — R001 Bradycardia, unspecified: Secondary | ICD-10-CM | POA: Diagnosis not present

## 2018-04-25 DIAGNOSIS — C3490 Malignant neoplasm of unspecified part of unspecified bronchus or lung: Secondary | ICD-10-CM | POA: Diagnosis not present

## 2018-04-25 DIAGNOSIS — R0602 Shortness of breath: Secondary | ICD-10-CM | POA: Diagnosis not present

## 2018-04-25 DIAGNOSIS — D696 Thrombocytopenia, unspecified: Secondary | ICD-10-CM | POA: Diagnosis not present

## 2018-04-25 DIAGNOSIS — I309 Acute pericarditis, unspecified: Secondary | ICD-10-CM | POA: Diagnosis not present

## 2018-04-25 LAB — BASIC METABOLIC PANEL
BUN / CREAT RATIO: 11 — AB (ref 12–28)
BUN: 11 mg/dL (ref 8–27)
CALCIUM: 9.6 mg/dL (ref 8.7–10.3)
CO2: 23 mmol/L (ref 20–29)
CREATININE: 0.96 mg/dL (ref 0.57–1.00)
Chloride: 93 mmol/L — ABNORMAL LOW (ref 96–106)
GFR calc non Af Amer: 55 mL/min/{1.73_m2} — ABNORMAL LOW (ref >59)
GFR, EST AFRICAN AMERICAN: 64 mL/min/{1.73_m2} (ref >59)
Glucose: 102 mg/dL — ABNORMAL HIGH (ref 65–99)
Potassium: 3.8 mmol/L (ref 3.5–5.2)
Sodium: 133 mmol/L — ABNORMAL LOW (ref 134–144)

## 2018-04-25 LAB — URINE CULTURE

## 2018-04-25 LAB — PRO B NATRIURETIC PEPTIDE: NT-Pro BNP: 525 pg/mL (ref 0–738)

## 2018-04-25 MED ORDER — CEPHALEXIN 500 MG PO CAPS: 500.0000 mg | ORAL_CAPSULE | Freq: Two times a day (BID) | ORAL | 0 refills | Status: DC

## 2018-04-25 NOTE — Progress Notes (Signed)
Cardiology Office Note:    Date:  04/25/2018   ID:  [name], DOB Apr 30, 1934, MRN [id number]  PCP:  Rebekah Maize, MD  Cardiologist:  Rebekah Burow, MD  Electrophysiologist:  Rebekah Grayer, MD  Oncologist:  Dr. Julien Paul   Referring MD: Rebekah Maize, MD   Chief Complaint  Patient presents with  . Bradycardia     History of Present Illness:    [name] is a 84 y.o. female with a complex past medical history.  She has neuroendocrine carcinoma with liver and pancreatic metastasis on chemotherapy, paroxysmal atrial fibrillation, prior pulmonary embolism, status post IVC filter, prior stroke, bradycardia status post ILR, cerebellar degeneration (wheelchair-bound), pericarditis.  She has been placed on Florinef in the past for symptomatic hypotension.  This has since been discontinued.  She was admitted in August 2019 and seen by cardiology for pericarditis.  There was some concern for hemorrhagic conversion and she was taken off of anticoagulation (Xarelto) for 2 weeks.  She was managed with colchicine.  She was last seen in clinic by Rebekah Ferries, PA-C for post hospital follow up.   She was seen by primary care 04/23/2018 and treated for a UTI.  She is referred back to cardiology for concerns about bradycardia.     [name] is here today with her husband.  Her blood pressure has been running higher recently.  Her lower extremity swelling is also worse and she has been taking Lasix the last few days.  She feels weak and tired.  She denies syncope.  She has had some left-sided chest discomfort.  This is minor and is fairly constant.  She denies any aggravating factors.  She has chronic shortness of breath that seems to have been worse recently.  She has not had orthopnea or paroxysmal nocturnal dyspnea.  Her urinary tract infection symptoms are somewhat improved on antibiotic therapy.  Prior CV studies:   The following studies were reviewed today:  Echo 03/24/2018 Mild LVH,  EF 65-70, normal wall motion, grade 1 diastolic dysfunction, aortic sclerosis without stenosis, mild AI, ascending aorta 39 mm (dilated), trivial MR/TR, mild PI, PASP 34, no pericardial effusion  Carotid US 07/15/2016 IMPRESSION: 1. Diffuse bilateral wall thickening. Mild bilateral carotid bifurcation atherosclerotic vascular plaque. No flow limiting stenosis. Degree of stenosis less than 50% bilaterally. 2. Vertebral arteries are patent with antegrade flow.  Nuclear stress test 05/08/2014 IMPRESSION: 1. No reversible ischemia or infarction. 2. Normal left ventricular wall motion. 3. Left ventricular ejection fraction 86% 4. Low-risk stress test findings*.  Past Medical History:  Diagnosis Date  . Acute respiratory failure with hypoxia (Rebekah) 10/23/2015  . Allergic rhinitis    PT. DENIES  . Anxiety   . Arthritis    NECK  . Ataxia   . Bradycardia    primarily nocturnal  . Burning tongue syndrome 25 years  . Cancer ([place]) dx'd 06/2016   liver  . Cataract   . Cerebellar degeneration   . Chronic pericarditis   . Chronic urinary tract infection   . Complication of anesthesia    low o2 sats, coded 30 years ago  . CVA (cerebral infarction) 05/2003  . Depression   . Encounter for antineoplastic chemotherapy 10/09/2016  . Gait disorder   . Gastric polyps   . GERD (gastroesophageal reflux disease)   . Goals of care, counseling/discussion 10/09/2016  . High cholesterol   . Hyperlipidemia   . Hypotension   . Hypothyroidism   . IBS (irritable bowel  syndrome)   . Obesity   . Paroxysmal atrial fibrillation ([name])    chads2vasc score is 6,  she is felt to be a poor candidate for anticoagulation  . Pericarditis   . Personal history of arterial venous malformation (AVM)    right side of face  . Seizure disorder ([place])   . Seizures ([place]) 2003   " smelling"- Gabapentin "no problem"  . Shortness of breath dyspnea    with exertion  . Sternum fx 10/27/2013  . Stroke [facility]) 5 years ago    Right side of face weak, slurred speach-   . Thyroid disease   . TIA (transient ischemic attack)   . UTI (lower urinary tract infection) 03/27/2016   "frequently"   Surgical Hx: The patient  has a past surgical history that includes Knee arthroscopy (Right, 11/14/2006); tibial and fibular internal fixation (Left); Upper gastrointestinal endoscopy (2009, 2013); Colonoscopy (2006, 2009); Appendectomy (84 years old); cyst removed (35 years ago); Total abdominal hysterectomy (84 years old); Knee arthroscopy (Bilateral, 5 and 6 years ago); Knee arthroscopy with lateral menisectomy (07/03/2012); Cardiac catheterization (N/A, 01/06/2015); Eye surgery (Right); Colonoscopy with propofol (N/A, 03/28/2016); IR Angiogram Visceral Selective (11/28/2016); IR Angiogram Selective Each Additional Vessel (11/28/2016); IR US Guide Vasc Access Right (11/28/2016); IR Angiogram Selective Each Additional Vessel (11/28/2016); IR Angiogram Visceral Selective (11/28/2016); IR Angiogram Selective Each Additional Vessel (11/28/2016); IR EMBO ARTERIAL NOT HEMORR HEMANG INC GUIDE ROADMAPPING (11/28/2016); IR Angiogram Selective Each Additional Vessel (11/28/2016); IR Angiogram Selective Each Additional Vessel (12/13/2016); IR Angiogram Visceral Selective (12/13/2016); IR Angiogram Selective Each Additional Vessel (12/13/2016); IR US Guide Vasc Access Right (12/13/2016); IR Angiogram Visceral Selective (12/13/2016); IR EMBO TUMOR ORGAN ISCHEMIA INFARCT INC GUIDE ROADMAPPING (12/13/2016); IR Angiogram Selective Each Additional Vessel (01/09/2017); IR EMBO TUMOR ORGAN ISCHEMIA INFARCT INC GUIDE ROADMAPPING (01/09/2017); IR US Guide Vasc Access Right (01/09/2017); IR Angiogram Visceral Selective (01/09/2017); IR Radiologist Eval & Mgmt (11/06/2016); IR Radiologist Eval & Mgmt (01/02/2017); IR IVC FILTER PLMT / S&I /IMG GUID/MOD SED (04/24/2017); IR Radiologist Eval & Mgmt (02/05/2017); IR Radiologist Eval & Mgmt (05/02/2017); and IR Radiologist Eval & Mgmt (10/17/2017).    Current Medications: Current Meds  Medication Sig  . ALPRAZolam (XANAX) 0.25 MG tablet Take 1 tablet (0.25 mg total) by mouth 2 (two) times daily as needed for anxiety.  Marland Kitchen amitriptyline (ELAVIL) 25 MG tablet Take 1 tablet (25 mg total) by mouth at bedtime.  Marland Kitchen escitalopram (LEXAPRO) 20 MG tablet Take 1 tablet (20 mg total) by mouth at bedtime.  . fluticasone (FLONASE) 50 MCG/ACT nasal spray Place 2 sprays into both nostrils at bedtime.  . furosemide (LASIX) 20 MG tablet TAKE 1 TABLET DAILY AS NEEDED FOR FLUID RETENTION (Patient taking differently: Take 20 mg by mouth as needed for fluid or edema. TAKE 1 TABLET DAILY AS NEEDED FOR FLUID RETENTION)  . gabapentin (NEURONTIN) 400 MG capsule Take 1-2 capsules (400-800 mg total) by mouth 3 (three) times daily. (Patient taking differently: Take 800 mg by mouth 2 (two) times daily. )  . hydroxypropyl methylcellulose / hypromellose (ISOPTO TEARS / GONIOVISC) 2.5 % ophthalmic solution Place 1 drop into both eyes as needed.   Marland Kitchen levothyroxine (SYNTHROID, LEVOTHROID) 75 MCG tablet TAKE 1 TABLET DAILY BEFORE BREAKFAST (Patient taking differently: Take 75 mcg by mouth daily before breakfast. )  . lovastatin (MEVACOR) 40 MG tablet Take 1 tablet (40 mg total) by mouth at bedtime.  . Melatonin 3 MG TBDP Take 3-6 mg by mouth at bedtime as needed. (Patient  taking differently: Take 3-6 mg by mouth at bedtime as needed (FOR SLEEP). )  . nitrofurantoin, macrocrystal-monohydrate, (MACROBID) 100 MG capsule Take 1 capsule (100 mg total) by mouth 2 (two) times daily for 5 days.  Marland Kitchen omeprazole (PRILOSEC) 40 MG capsule Take 1 capsule (40 mg total) by mouth daily.  . ondansetron (ZOFRAN) 8 MG tablet Take 1 tablet (8 mg total) by mouth every 8 (eight) hours as needed for nausea or vomiting.  Marland Kitchen oxyCODONE-acetaminophen (PERCOCET) 5-325 MG tablet Take 1 tablet by mouth every 8 (eight) hours as needed for severe pain.  . rivaroxaban (XARELTO) 20 MG TABS tablet Take 1 tablet (20 mg  total) by mouth daily with supper.  . temozolomide (TEMODAR) 140 MG capsule Take 2 capsules (280mg ) by mouth daily for 5 days on days 10-14 of each 28d cycle. May take on empty stomach at bedtime to decrease N/V     Allergies:   Lisinopril   Social History   Tobacco Use  . Smoking status: Former Smoker    Packs/day: 0.25    Years: 10.00    Pack years: 2.50    Types: Cigarettes    Last attempt to quit: 07/31/1975    Years since quitting: 42.7  . Smokeless tobacco: Never Used  Substance Use Topics  . Alcohol use: No    Comment: Rare- maybe a drink ever 2 years  . Drug use: No     Family Hx: The patient's family history includes Coronary artery disease in her brother; Diabetes in her brother; Heart attack (age of onset: 82) in her father; Prostate cancer in her brother and son.  ROS:   Please see the history of present illness.    Review of Systems  Constitution: Positive for malaise/fatigue.  Cardiovascular: Positive for dyspnea on exertion and irregular heartbeat.  Respiratory: Positive for shortness of breath and wheezing.   Hematologic/Lymphatic: Bruises/bleeds easily.  Genitourinary: Positive for dysuria.  Psychiatric/Behavioral: The patient is nervous/anxious.    All other systems reviewed and are negative.   EKGs/Labs/Other Test Reviewed:    EKG:  EKG is  ordered today.  The ekg ordered today demonstrates sinus bradycardia, heart rate 51, normal axis, no acute ST-T wave changes, QTC 451  Recent Labs: 07/10/2017: TSH 3.460 04/18/2018: ALT 13; BUN 12; Creatinine 0.88; Hemoglobin 11.8; Platelet Count 112; Potassium 4.7; Sodium 134   Recent Lipid Panel Lab Results  Component Value Date/Time   CHOL 124 07/15/2016 04:44 AM   CHOL 193 09/30/2015 03:40 PM   TRIG 65 07/15/2016 04:44 AM   TRIG 131 10/12/2014 03:07 PM   HDL 50 07/15/2016 04:44 AM   HDL 65 09/30/2015 03:40 PM   HDL 59 10/12/2014 03:07 PM   CHOLHDL 2.5 07/15/2016 04:44 AM   LDLCALC 61 07/15/2016 04:44 AM     LDLCALC 111 (H) 09/30/2015 03:40 PM    Physical Exam:    VS:  BP 132/66   Pulse (!) 51   Ht 5\' 4"  (1.626 m)   Wt 234 lb (106.1 kg)   BMI 40.17 kg/m     Wt Readings from Last 3 Encounters:  04/25/18 234 lb (106.1 kg)  04/22/18 234 lb 9.6 oz (106.4 kg)  04/14/18 227 lb (103 kg)     Physical Exam  Constitutional: She is oriented to person, place, and time. She appears well-developed and well-nourished. No distress.  HENT:  Head: Normocephalic and atraumatic.  Eyes: No scleral icterus.  Neck: No thyromegaly present.  Cardiovascular: Normal rate and regular rhythm.  Murmur heard.  Low-pitched systolic murmur is present with a grade of 1/6 at the upper right sternal border and upper left sternal border. Pulmonary/Chest: Effort normal. She has no rales.  Abdominal: Soft.  Musculoskeletal: She exhibits edema (1+ bilat ankle edema).  Lymphadenopathy:    She has no cervical adenopathy.  Neurological: She is alert and oriented to person, place, and time.  Skin: Skin is warm and dry.  Psychiatric: She has a normal mood and affect.    ASSESSMENT & PLAN:    Sinus bradycardia Her loop recorder interrogation was reviewed today.  I discussed this with Dr. Rayann Heman.  She has had some pauses of around 5-6 seconds.  These have occurred during hours of sleep.  She has not had syncope.  Her heart rate has been lower over the last several weeks.  However, she currently does not meet any indications for pacemaker implantation. Her weakness is likely multifactorial and related to her multiple comorbid illnesses.  Her heart rate may be lower in response to her being less active and being weaker.  I reviewed this with the patient and her husband.  She has follow-up with Dr. Rayann Heman in December.  She will keep that follow-up and return sooner if needed.  Paroxysmal atrial fibrillation ([name]) Maintaining normal sinus rhythm.  She is on chronic anticoagulation with rivaroxaban.  Idiopathic  pericarditis, unspecified chronicity  She was admitted in August 2019 with pericarditis.  She does note some atypical chest discomfort.  I will obtain a follow-up echocardiogram to rule out pericardial effusion.  She does not have ST elevation on her EKG today.  Neuroendocrine carcinoma metastatic to liver [facility]) Followed by oncology.  Chest pain, unspecified type As noted, she has some atypical chest discomfort.  ECG is unremarkable.  There is no ST elevation.  She is also short of breath and has some lower extremity swelling.  She has diastolic dysfunction on a prior echocardiogram.  I will obtain a BMET, BNP.  If her BNP is significantly elevated, I will have her take a course of daily Lasix.  She will need close follow-up on her sodium, potassium and creatinine (she has had a history of hyponatremia).  Obtain echocardiogram as noted above.  Follow-up in 4 to 6 weeks.   Dispo:  Return in about 6 weeks (around 06/06/2018) for Close Follow Up w/ Dr. Gwenlyn Found, or PA/NP.   Medication Adjustments/Labs and Tests Ordered: Current medicines are reviewed at length with the patient today.  Concerns regarding medicines are outlined above.  Tests Ordered: Orders Placed This Encounter  Procedures  . Basic Metabolic Panel (BMET)  . Pro b natriuretic peptide  . EKG 12-Lead  . ECHOCARDIOGRAM COMPLETE   Medication Changes: No orders of the defined types were placed in this encounter.   Signed, Richardson Dopp, PA-C  04/25/2018 1:30 PM    [facility] [place], [place], [place]  [address] Phone: [phone]; Fax: [phone]

## 2018-04-25 NOTE — Patient Instructions (Addendum)
Medication Instructions:  Your physician recommends that you continue on your current medications as directed. Please refer to the Current Medication list given to you today.   Labwork: TODAY BMET, PRO BNP   Testing/Procedures: 1. Your physician has requested that you have an echocardiogram. Echocardiography is a painless test that uses sound waves to create images of your heart. It provides your doctor with information about the size and shape of your heart and how well your heart's chambers and valves are working. This procedure takes approximately one hour. There are no restrictions for this procedure.    Follow-Up: 1. DR. Gwenlyn Found OR HIS CARE TEAM IN 4-6 WEEKS AT THE [facility]  2. KEEP YOUR APPT WITH DR. ALLRED 07/02/2018  Any Other Special Instructions Will Be Listed Below (If Applicable).     If you need a refill on your cardiac medications before your next appointment, please call your pharmacy.

## 2018-04-26 LAB — CUP PACEART REMOTE DEVICE CHECK
Date Time Interrogation Session: 20190914020613
Implantable Pulse Generator Implant Date: 20160609

## 2018-04-29 ENCOUNTER — Encounter: Payer: Self-pay | Admitting: Physician Assistant

## 2018-04-29 ENCOUNTER — Other Ambulatory Visit: Payer: Self-pay

## 2018-04-29 ENCOUNTER — Ambulatory Visit (HOSPITAL_COMMUNITY): Payer: Medicare Other | Attending: Physician Assistant

## 2018-04-29 DIAGNOSIS — E785 Hyperlipidemia, unspecified: Secondary | ICD-10-CM | POA: Insufficient documentation

## 2018-04-29 DIAGNOSIS — I352 Nonrheumatic aortic (valve) stenosis with insufficiency: Secondary | ICD-10-CM | POA: Insufficient documentation

## 2018-04-29 DIAGNOSIS — G459 Transient cerebral ischemic attack, unspecified: Secondary | ICD-10-CM | POA: Insufficient documentation

## 2018-04-29 DIAGNOSIS — Z8505 Personal history of malignant neoplasm of liver: Secondary | ICD-10-CM | POA: Diagnosis not present

## 2018-04-29 DIAGNOSIS — Z8249 Family history of ischemic heart disease and other diseases of the circulatory system: Secondary | ICD-10-CM | POA: Diagnosis not present

## 2018-04-29 DIAGNOSIS — R001 Bradycardia, unspecified: Secondary | ICD-10-CM | POA: Diagnosis not present

## 2018-04-29 DIAGNOSIS — E669 Obesity, unspecified: Secondary | ICD-10-CM | POA: Insufficient documentation

## 2018-04-29 DIAGNOSIS — Z87891 Personal history of nicotine dependence: Secondary | ICD-10-CM | POA: Insufficient documentation

## 2018-04-29 DIAGNOSIS — J9600 Acute respiratory failure, unspecified whether with hypoxia or hypercapnia: Secondary | ICD-10-CM | POA: Diagnosis not present

## 2018-04-29 DIAGNOSIS — I10 Essential (primary) hypertension: Secondary | ICD-10-CM | POA: Insufficient documentation

## 2018-04-29 DIAGNOSIS — I4891 Unspecified atrial fibrillation: Secondary | ICD-10-CM | POA: Insufficient documentation

## 2018-04-29 DIAGNOSIS — I300 Acute nonspecific idiopathic pericarditis: Secondary | ICD-10-CM | POA: Diagnosis not present

## 2018-04-29 DIAGNOSIS — Z8673 Personal history of transient ischemic attack (TIA), and cerebral infarction without residual deficits: Secondary | ICD-10-CM | POA: Diagnosis not present

## 2018-04-29 DIAGNOSIS — R569 Unspecified convulsions: Secondary | ICD-10-CM | POA: Insufficient documentation

## 2018-04-29 DIAGNOSIS — R079 Chest pain, unspecified: Secondary | ICD-10-CM | POA: Diagnosis not present

## 2018-04-29 DIAGNOSIS — Z6841 Body Mass Index (BMI) 40.0 and over, adult: Secondary | ICD-10-CM | POA: Insufficient documentation

## 2018-04-30 ENCOUNTER — Telehealth: Payer: Self-pay

## 2018-04-30 NOTE — Telephone Encounter (Signed)
Notes recorded by Frederik Schmidt, RN on 04/30/2018 at 8:39 AM EDT Spoke to patient and her husband with Echo results and recommendations. They verbalized understanding. I will forward to PCP.

## 2018-04-30 NOTE — Telephone Encounter (Signed)
-----   Message from Liliane Shi, [place] sent at 04/29/2018  4:29 PM EDT ----- This study demonstrates:  Normal heart function (ejection fraction), mild stiffness of the aortic valve (aortic stenosis) and moderate leakage of the aortic valve (aortic insufficiency) and mild dilation of the ascending aorta.  There is NO pericardial effusion.  Findings are similar to the last echocardiogram.  Medication changes / Follow up studies / Other recommendations:    - Continue current medications and follow up as planned.  Please send results to the PCP:  Eustaquio Maize, MD  Richardson Dopp, PA-C 04/29/2018 4:26 PM

## 2018-05-01 DIAGNOSIS — D696 Thrombocytopenia, unspecified: Secondary | ICD-10-CM | POA: Diagnosis not present

## 2018-05-01 DIAGNOSIS — R339 Retention of urine, unspecified: Secondary | ICD-10-CM | POA: Diagnosis not present

## 2018-05-01 DIAGNOSIS — C787 Secondary malignant neoplasm of liver and intrahepatic bile duct: Secondary | ICD-10-CM | POA: Diagnosis not present

## 2018-05-01 DIAGNOSIS — C259 Malignant neoplasm of pancreas, unspecified: Secondary | ICD-10-CM | POA: Diagnosis not present

## 2018-05-01 DIAGNOSIS — C3490 Malignant neoplasm of unspecified part of unspecified bronchus or lung: Secondary | ICD-10-CM | POA: Diagnosis not present

## 2018-05-01 DIAGNOSIS — I309 Acute pericarditis, unspecified: Secondary | ICD-10-CM | POA: Diagnosis not present

## 2018-05-01 MED FILL — CAPECITABINE 500 MG TABS: 500 | 14 days supply | Qty: 84 | Fill #0

## 2018-05-01 MED FILL — TEMOZOLOMIDE 140 MG CAPSULE: 140 | 28 days supply | Qty: 10 | Fill #0

## 2018-05-04 ENCOUNTER — Emergency Department (HOSPITAL_COMMUNITY)
Admission: EM | Admit: 2018-05-04 | Discharge: 2018-05-05 | Disposition: A | Discharge status: Home / Self Care | Payer: Medicare Other | Attending: Emergency Medicine | Admitting: Emergency Medicine

## 2018-05-04 ENCOUNTER — Encounter (HOSPITAL_COMMUNITY): Payer: Self-pay | Admitting: *Deleted

## 2018-05-04 ENCOUNTER — Other Ambulatory Visit: Payer: Self-pay

## 2018-05-04 ENCOUNTER — Emergency Department (HOSPITAL_COMMUNITY): Payer: Medicare Other

## 2018-05-04 DIAGNOSIS — Z7901 Long term (current) use of anticoagulants: Secondary | ICD-10-CM | POA: Diagnosis not present

## 2018-05-04 DIAGNOSIS — E039 Hypothyroidism, unspecified: Secondary | ICD-10-CM | POA: Diagnosis not present

## 2018-05-04 DIAGNOSIS — R0789 Other chest pain: Secondary | ICD-10-CM

## 2018-05-04 DIAGNOSIS — R0602 Shortness of breath: Secondary | ICD-10-CM | POA: Diagnosis not present

## 2018-05-04 DIAGNOSIS — Z87891 Personal history of nicotine dependence: Secondary | ICD-10-CM | POA: Diagnosis not present

## 2018-05-04 DIAGNOSIS — Z79899 Other long term (current) drug therapy: Secondary | ICD-10-CM | POA: Diagnosis not present

## 2018-05-04 DIAGNOSIS — R079 Chest pain, unspecified: Secondary | ICD-10-CM | POA: Diagnosis not present

## 2018-05-04 DIAGNOSIS — R0689 Other abnormalities of breathing: Secondary | ICD-10-CM | POA: Diagnosis not present

## 2018-05-04 DIAGNOSIS — I10 Essential (primary) hypertension: Secondary | ICD-10-CM | POA: Diagnosis not present

## 2018-05-04 LAB — CBC
HCT: 35.8 % — ABNORMAL LOW (ref 36.0–46.0)
HEMOGLOBIN: 11.9 g/dL — AB (ref 12.0–15.0)
MCH: 34.2 pg — AB (ref 26.0–34.0)
MCHC: 33.2 g/dL (ref 30.0–36.0)
MCV: 102.9 fL — AB (ref 78.0–100.0)
PLATELETS: 94 10*3/uL — AB (ref 150–400)
RBC: 3.48 MIL/uL — AB (ref 3.87–5.11)
RDW: 16.3 % — ABNORMAL HIGH (ref 11.5–15.5)
WBC: 5.1 10*3/uL (ref 4.0–10.5)

## 2018-05-04 MED ORDER — NITROGLYCERIN 2 % TD OINT
1.0000 [in_us] | TOPICAL_OINTMENT | Freq: Once | TRANSDERMAL | Status: AC
  Administered 2018-05-05: 1 [in_us] via TOPICAL
  Filled 2018-05-04: qty 1

## 2018-05-04 NOTE — ED Notes (Signed)
Patient transported to X-ray 

## 2018-05-04 NOTE — ED Notes (Signed)
Pt reports active chemo tx (pills) for lung CA with mets to liver. Pt has completed tx for pericarditis about one week ago.

## 2018-05-04 NOTE — ED Triage Notes (Signed)
Pt brought in by [facility] for c/o right side chest pain that radiates up to her neck; pt is receiving treatment for pericarditis; pt states her pain started x 2 hours ago and has never had pain like this before; pt cbg 161

## 2018-05-05 DIAGNOSIS — R079 Chest pain, unspecified: Secondary | ICD-10-CM | POA: Diagnosis not present

## 2018-05-05 DIAGNOSIS — R0789 Other chest pain: Secondary | ICD-10-CM | POA: Diagnosis not present

## 2018-05-05 LAB — BASIC METABOLIC PANEL
Anion gap: 6 (ref 5–15)
BUN: 16 mg/dL (ref 8–23)
CHLORIDE: 100 mmol/L (ref 98–111)
CO2: 25 mmol/L (ref 22–32)
CREATININE: 1.01 mg/dL — AB (ref 0.44–1.00)
Calcium: 9.4 mg/dL (ref 8.9–10.3)
GFR calc Af Amer: 58 mL/min — ABNORMAL LOW (ref >60)
GFR, EST NON AFRICAN AMERICAN: 50 mL/min — AB (ref >60)
GLUCOSE: 112 mg/dL — AB (ref 70–99)
POTASSIUM: 4.6 mmol/L (ref 3.5–5.1)
Sodium: 131 mmol/L — ABNORMAL LOW (ref 135–145)

## 2018-05-05 LAB — TROPONIN I: Troponin I: <0.03 ng/mL (ref <0.03)

## 2018-05-05 LAB — BRAIN NATRIURETIC PEPTIDE: B Natriuretic Peptide: 189 pg/mL — ABNORMAL HIGH (ref 0.0–100.0)

## 2018-05-05 LAB — D-DIMER, QUANTITATIVE: D-Dimer, Quant: 0.39 ug/mL-FEU (ref 0.00–0.50)

## 2018-05-05 MED ORDER — FENTANYL CITRATE (PF) 100 MCG/2ML IJ SOLN
50.0000 ug | Freq: Once | INTRAMUSCULAR | Status: AC
  Administered 2018-05-05: 50 ug via INTRAVENOUS
  Filled 2018-05-05: qty 2

## 2018-05-05 NOTE — ED Notes (Signed)
Pt and family updated on plan of care,  

## 2018-05-05 NOTE — ED Provider Notes (Signed)
[facility] EMERGENCY DEPARTMENT Provider Note   CSN: [id number] Arrival date & time: 05/04/18  2311  Time seen 23:30 PM    History   Chief Complaint Chief Complaint  Patient presents with  . Chest Pain    HPI [name] is a 84 y.o. female.  HPI patient states around 9 PM they were watching a movie and she started getting chest pain that is just to the right of the center of her chest and then it radiated up to her neck and her right clavicle area.  She states she has a constant pain that she describes as aching and not burning.  She states deep breathing makes the pain worse.  Nothing makes it feel better.  She states she took 2 Aleve without relief.  She states she has had this pain before and states there has been no diagnosis found.  She states she did have pericarditis a month ago but she is finished that medication.  She denies diaphoresis, nausea, or vomiting.  She states she is chronically short of breath so that was not worse.  She denies feeling lightheaded or dizziness or feeling like she is going to pass out.  She states she has had some swelling of her legs over the past few months but it is better now.  She also states she has been noted to have a low heart rate for the past 1 to 2 months and they cannot find out why.  Her husband also describes PND for the past few months.  Patient has had a loop recorder for the past 2 years.  Patient is on Xarelto.  PCP Eustaquio Maize, MD Oncology [facility] Cardiology Dr Gwenlyn Found, Dr Rayann Heman.   Past Medical History:  Diagnosis Date  . Acute respiratory failure with hypoxia ([place]) 10/23/2015  . Allergic rhinitis    PT. DENIES  . Anxiety   . Aortic insufficiency    Echo 04/29/2018: EF 65-70, mild AS (mean 13), mod AI, Asc Aorta 42 mm (mildly dilated), mild LAE, PASP 41, pericardium normal in appearance.   . Arthritis    NECK  . Ataxia   . Bradycardia    primarily nocturnal  . Burning tongue syndrome 25 years  . Cancer ([place])  dx'd 06/2016   liver  . Cataract   . Cerebellar degeneration ([place])   . Chronic urinary tract infection   . Complication of anesthesia    low o2 sats, coded 30 years ago  . CVA (cerebral infarction) 05/2003  . Depression   . Encounter for antineoplastic chemotherapy 10/09/2016  . Gait disorder   . Gastric polyps   . GERD (gastroesophageal reflux disease)   . Goals of care, counseling/discussion 10/09/2016  . High cholesterol   . History of pericarditis   . Hyperlipidemia   . Hypotension   . Hypothyroidism   . IBS (irritable bowel syndrome)   . Obesity   . Paroxysmal atrial fibrillation ([name])    chads2vasc score is 6,  she is felt to be a poor candidate for anticoagulation  . Pericarditis   . Personal history of arterial venous malformation (AVM)    right side of face  . Seizure disorder ([place])   . Seizures ([place]) 2003   " smelling"- Gabapentin "no problem"  . Shortness of breath dyspnea    with exertion  . Sternum fx 10/27/2013  . Stroke [facility]) 5 years ago   Right side of face weak, slurred speach-   . Thyroid disease   .  TIA (transient ischemic attack)   . UTI (lower urinary tract infection) 03/27/2016   "frequently"    Patient Active Problem List   Diagnosis Date Noted  . Acute chest pain   . Primary malignant neoplasm of lung metastatic to other site [facility])   . Macrocytic anemia   . Lung cancer ([place]) 03/23/2018  . Dehydration with hyponatremia 03/23/2018  . Thrombocytopenia ([place]) 03/23/2018  . Pericarditis 03/23/2018  . Osteoarthritis of knee 01/10/2018  . Hypotension 12/19/2017  . Pulmonary embolism ([place]) 04/24/2017  . Pulmonary embolus ([place]) 04/23/2017  . Neuroendocrine carcinoma metastatic to liver ([place]) 12/13/2016  . Back pain 12/13/2016  . Chest pain 12/13/2016  . Goals of care, counseling/discussion 10/09/2016  . Encounter for antineoplastic chemotherapy 10/09/2016  . Neuroendocrine cancer ([place]) 08/23/2016  . Liver metastases ([place]) 08/23/2016  . Late  effect of cerebrovascular accident (CVA) 08/02/2016  . H/O: CVA (cerebrovascular accident) 07/14/2016  . Meningioma ([place]) 07/14/2016  . Asthmatic bronchitis 10/23/2015  . GAD (generalized anxiety disorder) 09/30/2015  . Peripheral edema 09/30/2015  . Insomnia 09/30/2015  . Localization-related partial epilepsy with simple partial seizures ([place]) 06/27/2015  . Allergic rhinitis 05/24/2015  . Syncope 01/06/2015  . Sinus bradycardia 01/03/2015  . Paroxysmal atrial fibrillation ([place]) 01/03/2015  . Fatigue 01/03/2015  . Morbid obesity ([place]) 01/03/2015  . Solitary pulmonary nodule 06/02/2014  . Thyroid nodule 05/08/2014  . Palpitation 05/07/2014  . Depression   . Seizure disorder ([place])   . Hypothyroidism   . Cerebellar degeneration ([place])   . Irritable bowel syndrome 02/18/2008  . Hyperlipemia 09/10/2006  . Essential hypertension 09/10/2006  . GERD (gastroesophageal reflux disease) 09/10/2006  . Cerebral infarction ([place]) 05/31/2003    Past Surgical History:  Procedure Laterality Date  . APPENDECTOMY  84 years old  . COLONOSCOPY  2006, 2009  . COLONOSCOPY WITH PROPOFOL N/A 03/28/2016   Procedure: COLONOSCOPY WITH PROPOFOL;  Surgeon: Gatha Mayer, MD;  Location: [place];  Service: Endoscopy;  Laterality: N/A;  . cyst removed  35 years ago  . EP IMPLANTABLE DEVICE N/A 01/06/2015   Procedure: Loop Recorder Insertion;  Surgeon: Thompson Grayer, MD;  Location: [place] [facility];  Service: Cardiovascular;  Laterality: N/A;  . EYE SURGERY Right    Cataract  . IR ANGIOGRAM SELECTIVE EACH ADDITIONAL VESSEL  11/28/2016  . IR ANGIOGRAM SELECTIVE EACH ADDITIONAL VESSEL  11/28/2016  . IR ANGIOGRAM SELECTIVE EACH ADDITIONAL VESSEL  11/28/2016  . IR ANGIOGRAM SELECTIVE EACH ADDITIONAL VESSEL  11/28/2016  . IR ANGIOGRAM SELECTIVE EACH ADDITIONAL VESSEL  12/13/2016  . IR ANGIOGRAM SELECTIVE EACH ADDITIONAL VESSEL  12/13/2016  . IR ANGIOGRAM SELECTIVE EACH ADDITIONAL VESSEL  01/09/2017  . IR ANGIOGRAM  VISCERAL SELECTIVE  11/28/2016  . IR ANGIOGRAM VISCERAL SELECTIVE  11/28/2016  . IR ANGIOGRAM VISCERAL SELECTIVE  12/13/2016  . IR ANGIOGRAM VISCERAL SELECTIVE  12/13/2016  . IR ANGIOGRAM VISCERAL SELECTIVE  01/09/2017  . IR EMBO ARTERIAL NOT HEMORR HEMANG INC GUIDE ROADMAPPING  11/28/2016  . IR EMBO TUMOR ORGAN ISCHEMIA INFARCT INC GUIDE ROADMAPPING  12/13/2016  . IR EMBO TUMOR ORGAN ISCHEMIA INFARCT INC GUIDE ROADMAPPING  01/09/2017  . IR IVC FILTER PLMT / S&I /IMG GUID/MOD SED  04/24/2017  . IR RADIOLOGIST EVAL & MGMT  11/06/2016  . IR RADIOLOGIST EVAL & MGMT  01/02/2017  . IR RADIOLOGIST EVAL & MGMT  02/05/2017  . IR RADIOLOGIST EVAL & MGMT  05/02/2017  . IR RADIOLOGIST EVAL & MGMT  10/17/2017  . IR US GUIDE  VASC ACCESS RIGHT  11/28/2016  . IR US GUIDE VASC ACCESS RIGHT  12/13/2016  . IR US GUIDE VASC ACCESS RIGHT  01/09/2017  . KNEE ARTHROSCOPY Right 11/14/2006  . KNEE ARTHROSCOPY Bilateral 5 and 6 years ago  . KNEE ARTHROSCOPY WITH LATERAL MENISECTOMY  07/03/2012   Procedure: KNEE ARTHROSCOPY WITH LATERAL MENISECTOMY;  Surgeon: Magnus Sinning, MD;  Location: [facility];  Service: Orthopedics;  Laterality: Left;  with Partial Lateral Menisectomy and Medial Menisectomy. Shaving of medial and lateral femoral condyles. Shaving of patella. Removal of a loose body  . tibial and fibular internal fixation Left   . TOTAL ABDOMINAL HYSTERECTOMY  84 years old  . UPPER GASTROINTESTINAL ENDOSCOPY  2009, 2013     OB History   None      Home Medications    Prior to Admission medications   Medication Sig Start Date End Date Taking? Authorizing Provider  ALPRAZolam (XANAX) 0.25 MG tablet Take 1 tablet (0.25 mg total) by mouth 2 (two) times daily as needed for anxiety. 04/04/18   Eustaquio Maize, MD  amitriptyline (ELAVIL) 25 MG tablet Take 1 tablet (25 mg total) by mouth at bedtime. 03/07/18   Eustaquio Maize, MD  capecitabine (XELODA) 500 MG tablet Take 3 Tablets (1500mg ) by mouth twice daily after a meal. Take on  days 1-14 of each 28d cycle Patient not taking: Reported on 04/25/2018 04/23/18   Curt Bears, MD  cephALEXin (KEFLEX) 500 MG capsule Take 1 capsule (500 mg total) by mouth 2 (two) times daily. 04/25/18   Eustaquio Maize, MD  escitalopram (LEXAPRO) 20 MG tablet Take 1 tablet (20 mg total) by mouth at bedtime. 03/07/18   Eustaquio Maize, MD  fluticasone (FLONASE) 50 MCG/ACT nasal spray Place 2 sprays into both nostrils at bedtime. 08/08/17   Eustaquio Maize, MD  furosemide (LASIX) 20 MG tablet TAKE 1 TABLET DAILY AS NEEDED FOR FLUID RETENTION Patient taking differently: Take 20 mg by mouth as needed for fluid or edema. TAKE 1 TABLET DAILY AS NEEDED FOR FLUID RETENTION 03/07/18   Eustaquio Maize, MD  gabapentin (NEURONTIN) 400 MG capsule Take 1-2 capsules (400-800 mg total) by mouth 3 (three) times daily. Patient taking differently: Take 800 mg by mouth 2 (two) times daily.  03/07/18   Eustaquio Maize, MD  hydroxypropyl methylcellulose / hypromellose (ISOPTO TEARS / GONIOVISC) 2.5 % ophthalmic solution Place 1 drop into both eyes as needed.     [provider]  levothyroxine (SYNTHROID, LEVOTHROID) 75 MCG tablet TAKE 1 TABLET DAILY BEFORE BREAKFAST Patient taking differently: Take 75 mcg by mouth daily before breakfast.  02/28/18   Eustaquio Maize, MD  lovastatin (MEVACOR) 40 MG tablet Take 1 tablet (40 mg total) by mouth at bedtime. 03/07/18   Eustaquio Maize, MD  Melatonin 3 MG TBDP Take 3-6 mg by mouth at bedtime as needed. Patient taking differently: Take 3-6 mg by mouth at bedtime as needed (FOR SLEEP).  08/02/16   Eustaquio Maize, MD  omeprazole (PRILOSEC) 40 MG capsule Take 1 capsule (40 mg total) by mouth daily. 03/07/18   Eustaquio Maize, MD  ondansetron (ZOFRAN) 8 MG tablet Take 1 tablet (8 mg total) by mouth every 8 (eight) hours as needed for nausea or vomiting. 02/07/18   Eustaquio Maize, MD  OXcarbazepine (TRILEPTAL) 150 MG tablet Take 1 tablet (150 mg total) by mouth 2 (two) times  daily. Patient not taking: Reported on 04/25/2018 03/19/18  Pieter Partridge, DO  oxyCODONE-acetaminophen (PERCOCET) 5-325 MG tablet Take 1 tablet by mouth every 8 (eight) hours as needed for severe pain. 03/12/18   Janora Norlander, DO  rivaroxaban (XARELTO) 20 MG TABS tablet Take 1 tablet (20 mg total) by mouth daily with supper. 04/08/18   Roney Jaffe, MD  temozolomide (TEMODAR) 140 MG capsule Take 2 capsules (280mg ) by mouth daily for 5 days on days 10-14 of each 28d cycle. May take on empty stomach at bedtime to decrease N/V 04/23/18   Curt Bears, MD    Family History Family History  Problem Relation Age of Onset  . Heart attack Father 60       fatal  . Coronary artery disease Brother   . Diabetes Brother   . Prostate cancer Brother   . Prostate cancer Son     Social History Social History   Tobacco Use  . Smoking status: Former Smoker    Packs/day: 0.25    Years: 10.00    Pack years: 2.50    Types: Cigarettes    Last attempt to quit: 07/31/1975    Years since quitting: 42.7  . Smokeless tobacco: Never Used  Substance Use Topics  . Alcohol use: No    Comment: Rare- maybe a drink ever 2 years  . Drug use: No  lives at home Lives with spouse   Allergies   Lisinopril   Review of Systems Review of Systems  Constitutional: Negative.  Negative for activity change, appetite change and fever.  Eyes: Negative.   Respiratory: Negative.   Cardiovascular: Negative.   All other systems reviewed and are negative.    Physical Exam Updated Vital Signs BP (!) 177/60   Pulse (!) 57   Temp 98.2 F (36.8 C) (Oral)   Resp (!) 24   Ht 5\' 4"  (1.626 m)   Wt 106.1 kg   SpO2 100%   BMI 40.17 kg/m   Vital signs normal except for bradycardia  Physical Exam  Constitutional: She is oriented to person, place, and time. She appears well-developed and well-nourished.  Non-toxic appearance. She does not appear ill. No distress.  HENT:  Head: Normocephalic and atraumatic.    Right Ear: External ear normal.  Left Ear: External ear normal.  Nose: Nose normal. No mucosal edema or rhinorrhea.  Mouth/Throat: Oropharynx is clear and moist and mucous membranes are normal. No dental abscesses or uvula swelling.  Eyes: Pupils are equal, round, and reactive to light. Conjunctivae and EOM are normal.  Neck: Normal range of motion and full passive range of motion without pain. Neck supple.  Cardiovascular: Normal rate and regular rhythm. Exam reveals no gallop and no friction rub.  Murmur heard.  Systolic murmur is present with a grade of 1/6. Pulmonary/Chest: Effort normal and breath sounds normal. No respiratory distress. She has no wheezes. She has no rhonchi. She has no rales. She exhibits no tenderness and no crepitus.  Patient states her chest was sore when EMS palpated it but not now.    Abdominal: Soft. Normal appearance and bowel sounds are normal. She exhibits no distension. There is no tenderness. There is no rebound and no guarding.  Musculoskeletal: Normal range of motion. She exhibits no edema or tenderness.  Moves all extremities well.   Neurological: She is alert and oriented to person, place, and time. She has normal strength. No cranial nerve deficit.  Skin: Skin is warm, dry and intact. No rash noted. No erythema. No pallor.  Psychiatric: She  has a normal mood and affect. Her speech is normal and behavior is normal. Her mood appears not anxious.  Nursing note and vitals reviewed.    ED Treatments / Results  Labs (all labs ordered are listed, but only abnormal results are displayed)  Results for orders placed or performed during the hospital encounter of 05/23/84  Basic metabolic panel  Result Value Ref Range   Sodium 131 (L) 135 - 145 mmol/L   Potassium 4.6 3.5 - 5.1 mmol/L   Chloride 100 98 - 111 mmol/L   CO2 25 22 - 32 mmol/L   Glucose, Bld 112 (H) 70 - 99 mg/dL   BUN 16 8 - 23 mg/dL   Creatinine, Ser 1.01 (H) 0.44 - 1.00 mg/dL   Calcium  9.4 8.9 - 10.3 mg/dL   GFR calc non Af Amer 50 (L) >60 mL/min   GFR calc Af Amer 58 (L) >60 mL/min   Anion gap 6 5 - 15  CBC  Result Value Ref Range   WBC 5.1 4.0 - 10.5 K/uL   RBC 3.48 (L) 3.87 - 5.11 MIL/uL   Hemoglobin 11.9 (L) 12.0 - 15.0 g/dL   HCT 35.8 (L) 36.0 - 46.0 %   MCV 102.9 (H) 78.0 - 100.0 fL   MCH 34.2 (H) 26.0 - 34.0 pg   MCHC 33.2 30.0 - 36.0 g/dL   RDW 16.3 (H) 11.5 - 15.5 %   Platelets 94 (L) 150 - 400 K/uL  Troponin I  Result Value Ref Range   Troponin I <0.03 <0.03 ng/mL  D-dimer, quantitative  Result Value Ref Range   D-Dimer, Quant 0.39 0.00 - 0.50 ug/mL-FEU  Brain natriuretic peptide  Result Value Ref Range   B Natriuretic Peptide 189.0 (H) 0.0 - 100.0 pg/mL  Troponin I  Result Value Ref Range   Troponin I <0.03 <0.03 ng/mL     Laboratory interpretation all normal except mild anemia, renal insufficiency   EKG EKG Interpretation  Date/Time:  Sunday May 04 2018 23:21:22 EDT Ventricular Rate:  51 PR Interval:    QRS Duration: 97 QT Interval:  453 QTC Calculation: 418 R Axis:   42 Text Interpretation:  Sinus rhythm Sinus bradycardia Electrode noise Since last tracing rate slower 25 Mar 2018 Confirmed by Rolland Porter [phone]) on 05/04/2018 11:34:12 PM   Radiology Dg Chest 2 View  Result Date: 05/05/2018 CLINICAL DATA:  Right chest pain radiating up into the neck. Pericarditis. History of liver cancer.IMPRESSION: 1. Mild enlargement of the cardiopericardial silhouette pulmonary venous hypertension but without overt edema. 2.  Aortic Atherosclerosis (ICD10-I70.0). Electronically Signed   By: Van Clines M.D.   On: 05/05/2018 00:11   Ct Chest W Contrast  Result Date: 04/18/2018 CLINICAL DATA:  Neuroendocrine tumor of the GI tract. Carcinoid syndrome. Known metastasis. Neuroendocrine lung primary. On oral chemotherapy. Asymptomatic. Status post Y 90 embolization. IMPRESSION: 1. Mild progression of hepatic metastasis, as evidenced by  enlargement of a segment 3 lesion. 2. Similar to minimal progression of pulmonary metastasis, as detailed above. 3. No new sites of metastatic disease. 4. Coronary artery atherosclerosis. Aortic Atherosclerosis (ICD10-I70.0). 5. Similar ascending aortic dilatation. 6. Cholelithiasis. Electronically Signed   By: Abigail Miyamoto M.D.   On: 04/18/2018 17:05   Ct Abdomen Pelvis W Contrast  Result Date: 04/18/2018 CLINICAL DATA:  Neuroendocrine tumor of the GI tract. Carcinoid syndrome. Known metastasis. Neuroendocrine lung primary. On oral chemotherapy. Asymptomatic. Status post Y 90 embolization. . IMPRESSION: 1. Mild progression of hepatic metastasis, as evidenced  by enlargement of a segment 3 lesion. 2. Similar to minimal progression of pulmonary metastasis, as detailed above. 3. No new sites of metastatic disease. 4. Coronary artery atherosclerosis. Aortic Atherosclerosis (ICD10-I70.0). 5. Similar ascending aortic dilatation. 6. Cholelithiasis. Electronically Signed   By: Abigail Miyamoto M.D.   On: 04/18/2018 17:05     Procedures Procedures (including critical care time)   Apr 29, 2018 Echocardiogram Study Conclusions  - Left ventricle: The cavity size was normal. Wall thickness was   normal. Systolic function was vigorous. The estimated ejection   fraction was in the range of 65% to 70%. Doppler parameters are   consistent with high ventricular filling pressure. - Aortic valve: Mildly thickened, mildly calcified leaflets. There   was mild stenosis. There was moderate regurgitation. Peak   velocity (S): 249 cm/s. Mean gradient (S): 13 mm Hg. Valve area   (VTI): 2.05 cm^2. Valve area (Vmax): 2.13 cm^2. Valve area   (Vmean): 1.93 cm^2. - Aorta: Ascending aortic diameter: 42 mm (S). - Ascending aorta: The ascending aorta was mildly dilated. - Left atrium: The atrium was mildly dilated. - Pulmonary arteries: Systolic pressure was mildly increased. PA   peak pressure: 41 mm Hg  (S).  Impressions:  - Aortic root 66mm in 2017, no significant change.  Medications Ordered in ED Medications  nitroGLYCERIN (NITROGLYN) 2 % ointment 1 inch (1 inch Topical Given 05/05/18 0026)  fentaNYL (SUBLIMAZE) injection 50 mcg (50 mcg Intravenous Given 05/05/18 0300)     Initial Impression / Assessment and Plan / ED Course  I have reviewed the triage vital signs and the nursing notes.  Pertinent labs & imaging results that were available during my care of the patient were reviewed by me and considered in my medical decision making (see chart for details).    Patient was given nitroglycerin to see if that would help her chest pain.  Laboratory testing was ordered.  2:30 AM patient states she still having chest pain.  She was given fentanyl IV.  We discussed her test results.  Her d-dimer is normal, plus she has the IVC filter.  Suspicion for PE is extremely low.  She also is on Xarelto.  We also discussed getting a delta troponin at 3 AM which would be 6 hours after her chest pain started.  4:20 AM patient was just seen last week by the cardiologist.  She also had a echocardiogram done less than a week ago.  She states this chest pain tonight is different from when she had her pericarditis.  She states her constant pain is gone but now she takes a big deep breath she gets a discomfort.  Patient was discharged home.  She states she has had this pain before without a diagnosis found.   Final Clinical Impressions(s) / ED Diagnoses   Final diagnoses:  Atypical chest pain    ED Discharge Orders    None     Plan discharge  Rolland Porter, MD, Barbette Or, MD 05/05/18 [phone]

## 2018-05-05 NOTE — Discharge Instructions (Addendum)
Please let Dr Kennon Holter office know you had to come to the ED tonight. Recheck as needed.

## 2018-05-05 NOTE — ED Notes (Addendum)
Pt reports needing to urinate but is unable to unless she stands up and urinates in her brief. Assisted pt with standing and changed brief after pt urinated. Note that pt respirations increased and got short of breath when standing and getting back in bed.

## 2018-05-08 ENCOUNTER — Encounter: Payer: Self-pay | Admitting: Internal Medicine

## 2018-05-08 ENCOUNTER — Ambulatory Visit (INDEPENDENT_AMBULATORY_CARE_PROVIDER_SITE_OTHER): Payer: Medicare Other | Admitting: Internal Medicine

## 2018-05-08 VITALS — BP 116/54 | HR 47 | Ht 64.0 in | Wt 231.0 lb

## 2018-05-08 DIAGNOSIS — R55 Syncope and collapse: Secondary | ICD-10-CM

## 2018-05-08 DIAGNOSIS — I480 Paroxysmal atrial fibrillation: Secondary | ICD-10-CM

## 2018-05-08 DIAGNOSIS — R001 Bradycardia, unspecified: Secondary | ICD-10-CM | POA: Diagnosis not present

## 2018-05-08 DIAGNOSIS — I10 Essential (primary) hypertension: Secondary | ICD-10-CM

## 2018-05-08 DIAGNOSIS — R5383 Other fatigue: Secondary | ICD-10-CM | POA: Diagnosis not present

## 2018-05-08 LAB — CUP PACEART INCLINIC DEVICE CHECK
Implantable Pulse Generator Implant Date: 20160609
MDC IDC SESS DTM: 20191010130213

## 2018-05-08 NOTE — H&P (View-Only) (Signed)
PCP: Eustaquio Maize, MD Primary Cardiologist: Dr Gwenlyn Found Primary EP: Dr Rayann Heman  [name] is a 84 y.o. female who presents today for routine electrophysiology followup.  Since last being seen in our clinic, the patient reports doing reasonably well.  Her husband reports that her memory has been worsening lately.   She was recently at [facility] ED with chest pain.  She reports precordial pain with radiation into her back.  This has resolved over the past 2 days.  She is chronically fatigued, weak, and unsteady.  Her heart rates are now lower. Today, she denies symptoms of palpitations,  lower extremity edema, dizziness, presyncope, or syncope.  The patient is otherwise without complaint today.   Past Medical History:  Diagnosis Date  . Acute respiratory failure with hypoxia ([place]) 10/23/2015  . Allergic rhinitis    PT. DENIES  . Anxiety   . Aortic insufficiency    Echo 04/29/2018: EF 65-70, mild AS (mean 13), mod AI, Asc Aorta 42 mm (mildly dilated), mild LAE, PASP 41, pericardium normal in appearance.   . Arthritis    NECK  . Ataxia   . Bradycardia    primarily nocturnal  . Burning tongue syndrome 25 years  . Cancer ([place]) dx'd 06/2016   liver  . Cataract   . Cerebellar degeneration ([place])   . Chronic urinary tract infection   . Complication of anesthesia    low o2 sats, coded 30 years ago  . CVA (cerebral infarction) 05/2003  . Depression   . Encounter for antineoplastic chemotherapy 10/09/2016  . Gait disorder   . Gastric polyps   . GERD (gastroesophageal reflux disease)   . Goals of care, counseling/discussion 10/09/2016  . High cholesterol   . History of pericarditis   . Hyperlipidemia   . Hypotension   . Hypothyroidism   . IBS (irritable bowel syndrome)   . Obesity   . Paroxysmal atrial fibrillation ([name])    chads2vasc score is 6,  she is felt to be a poor candidate for anticoagulation  . Pericarditis   . Personal history of arterial venous malformation (AVM)    right  side of face  . Seizure disorder ([place])   . Seizures ([place]) 2003   " smelling"- Gabapentin "no problem"  . Shortness of breath dyspnea    with exertion  . Sternum fx 10/27/2013  . Stroke [facility]) 5 years ago   Right side of face weak, slurred speach-   . Thyroid disease   . TIA (transient ischemic attack)   . UTI (lower urinary tract infection) 03/27/2016   "frequently"   Past Surgical History:  Procedure Laterality Date  . APPENDECTOMY  84 years old  . COLONOSCOPY  2006, 2009  . COLONOSCOPY WITH PROPOFOL N/A 03/28/2016   Procedure: COLONOSCOPY WITH PROPOFOL;  Surgeon: Gatha Mayer, MD;  Location: [place];  Service: Endoscopy;  Laterality: N/A;  . cyst removed  35 years ago  . EP IMPLANTABLE DEVICE N/A 01/06/2015   Procedure: Loop Recorder Insertion;  Surgeon: Thompson Grayer, MD;  Location: [place] [facility];  Service: Cardiovascular;  Laterality: N/A;  . EYE SURGERY Right    Cataract  . IR ANGIOGRAM SELECTIVE EACH ADDITIONAL VESSEL  11/28/2016  . IR ANGIOGRAM SELECTIVE EACH ADDITIONAL VESSEL  11/28/2016  . IR ANGIOGRAM SELECTIVE EACH ADDITIONAL VESSEL  11/28/2016  . IR ANGIOGRAM SELECTIVE EACH ADDITIONAL VESSEL  11/28/2016  . IR ANGIOGRAM SELECTIVE EACH ADDITIONAL VESSEL  12/13/2016  . IR ANGIOGRAM SELECTIVE EACH ADDITIONAL  VESSEL  12/13/2016  . IR ANGIOGRAM SELECTIVE EACH ADDITIONAL VESSEL  01/09/2017  . IR ANGIOGRAM VISCERAL SELECTIVE  11/28/2016  . IR ANGIOGRAM VISCERAL SELECTIVE  11/28/2016  . IR ANGIOGRAM VISCERAL SELECTIVE  12/13/2016  . IR ANGIOGRAM VISCERAL SELECTIVE  12/13/2016  . IR ANGIOGRAM VISCERAL SELECTIVE  01/09/2017  . IR EMBO ARTERIAL NOT HEMORR HEMANG INC GUIDE ROADMAPPING  11/28/2016  . IR EMBO TUMOR ORGAN ISCHEMIA INFARCT INC GUIDE ROADMAPPING  12/13/2016  . IR EMBO TUMOR ORGAN ISCHEMIA INFARCT INC GUIDE ROADMAPPING  01/09/2017  . IR IVC FILTER PLMT / S&I /IMG GUID/MOD SED  04/24/2017  . IR RADIOLOGIST EVAL & MGMT  11/06/2016  . IR RADIOLOGIST EVAL & MGMT  01/02/2017  . IR  RADIOLOGIST EVAL & MGMT  02/05/2017  . IR RADIOLOGIST EVAL & MGMT  05/02/2017  . IR RADIOLOGIST EVAL & MGMT  10/17/2017  . IR US GUIDE VASC ACCESS RIGHT  11/28/2016  . IR US GUIDE VASC ACCESS RIGHT  12/13/2016  . IR US GUIDE VASC ACCESS RIGHT  01/09/2017  . KNEE ARTHROSCOPY Right 11/14/2006  . KNEE ARTHROSCOPY Bilateral 5 and 6 years ago  . KNEE ARTHROSCOPY WITH LATERAL MENISECTOMY  07/03/2012   Procedure: KNEE ARTHROSCOPY WITH LATERAL MENISECTOMY;  Surgeon: Magnus Sinning, MD;  [facility];  Service: Orthopedics;  Laterality: Left;  with Partial Lateral Menisectomy and Medial Menisectomy. Shaving of medial and lateral femoral condyles. Shaving of patella. Removal of a loose body  . tibial and fibular internal fixation Left   . TOTAL ABDOMINAL HYSTERECTOMY  84 years old  . UPPER GASTROINTESTINAL ENDOSCOPY  2009, 2013    ROS- all systems are reviewed and negatives except as per HPI above  Current Outpatient Medications  Medication Sig Dispense Refill  . ALPRAZolam (XANAX) 0.25 MG tablet Take 1 tablet (0.25 mg total) by mouth 2 (two) times daily as needed for anxiety. 30 tablet 1  . amitriptyline (ELAVIL) 25 MG tablet Take 1 tablet (25 mg total) by mouth at bedtime. 90 tablet 1  . capecitabine (XELODA) 500 MG tablet Take 3 Tablets (1500mg ) by mouth twice daily after a meal. Take on days 1-14 of each 28d cycle 84 tablet 2  . cephALEXin (KEFLEX) 500 MG capsule Take 1 capsule (500 mg total) by mouth 2 (two) times daily. 10 capsule 0  . escitalopram (LEXAPRO) 20 MG tablet Take 1 tablet (20 mg total) by mouth at bedtime. 90 tablet 1  . fluticasone (FLONASE) 50 MCG/ACT nasal spray Place 2 sprays into both nostrils at bedtime. 16 g 3  . furosemide (LASIX) 20 MG tablet TAKE 1 TABLET DAILY AS NEEDED FOR FLUID RETENTION (Patient taking differently: Take 20 mg by mouth as needed for fluid or edema. TAKE 1 TABLET DAILY AS NEEDED FOR FLUID RETENTION) 90 tablet 0  . gabapentin (NEURONTIN) 400 MG capsule  Take 1-2 capsules (400-800 mg total) by mouth 3 (three) times daily. (Patient taking differently: Take 800 mg by mouth 2 (two) times daily. ) 720 capsule 1  . hydroxypropyl methylcellulose / hypromellose (ISOPTO TEARS / GONIOVISC) 2.5 % ophthalmic solution Place 1 drop into both eyes as needed.     Marland Kitchen levothyroxine (SYNTHROID, LEVOTHROID) 75 MCG tablet TAKE 1 TABLET DAILY BEFORE BREAKFAST (Patient taking differently: Take 75 mcg by mouth daily before breakfast. ) 90 tablet 1  . lovastatin (MEVACOR) 40 MG tablet Take 1 tablet (40 mg total) by mouth at bedtime. 90 tablet 1  . Melatonin 3 MG TBDP Take 3-6 mg  by mouth at bedtime as needed. (Patient taking differently: Take 3-6 mg by mouth at bedtime as needed (FOR SLEEP). ) 60 tablet 1  . omeprazole (PRILOSEC) 40 MG capsule Take 1 capsule (40 mg total) by mouth daily. 90 capsule 1  . ondansetron (ZOFRAN) 8 MG tablet Take 1 tablet (8 mg total) by mouth every 8 (eight) hours as needed for nausea or vomiting. 90 tablet 1  . OXcarbazepine (TRILEPTAL) 150 MG tablet Take 1 tablet (150 mg total) by mouth 2 (two) times daily. 60 tablet 3  . oxyCODONE-acetaminophen (PERCOCET) 5-325 MG tablet Take 1 tablet by mouth every 8 (eight) hours as needed for severe pain. 10 tablet 0  . rivaroxaban (XARELTO) 20 MG TABS tablet Take 1 tablet (20 mg total) by mouth daily with supper.    . temozolomide (TEMODAR) 140 MG capsule Take 2 capsules (280mg ) by mouth daily for 5 days on days 10-14 of each 28d cycle. May take on empty stomach at bedtime to decrease N/V 10 capsule 2   No current facility-administered medications for this visit.     Physical Exam: Vitals:   05/08/18 0957  BP: (!) 116/54  Pulse: (!) 47  SpO2: 97%  Weight: 231 lb (104.8 kg)  Height: 5\' 4"  (1.626 m)    GEN- The patient is chronically ill appearing, alert and oriented x 3 today.   Head- normocephalic, atraumatic Eyes-  Sclera clear, conjunctiva pink Ears- hearing intact Oropharynx- clear Lungs-  Clear to ausculation bilaterally, normal work of breathing Heart- bradycardiac regular rhythm, no murmurs, rubs or gallops, PMI not laterally displaced GI- soft, NT, ND, + BS Extremities- no clubbing, cyanosis, or edema  Wt Readings from Last 3 Encounters:  05/08/18 231 lb (104.8 kg)  05/04/18 234 lb (106.1 kg)  04/25/18 234 lb (106.1 kg)    EKG tracing ordered today is personally reviewed and shows junctional rhythm 47 bpm, nonspecific St/T changes  Assessment and Plan:  1. Sinus bradycardia Her bradycardia is more pronounced today. She has fatigue and chronic debility.  It is difficult to know if her bradycardia is the cause for symptoms or not. The patient has symptomatic bradycardia.  I would therefore recommend pacemaker implantation at this time.  Risks, benefits, alternatives to pacemaker implantation were discussed in detail with the patient today. The patient understands that the risks include but are not limited to bleeding, infection, pneumothorax, perforation, tamponade, vascular damage, renal failure, MI, stroke, death,  and lead dislodgement and wishes to proceed. We will therefore schedule the procedure at for 05/22/18 to follow cath (see below).  I will also plan ILR removal at the same time.  2. Paroxysmal atrial fibrillation afib burden 0.3%, longest episode 20 minutes On xarelto  3. HTN Stable No change required today  4. Lung CA Followed by Dr [name] metastatic per patient  5. Chest pain I am worried that this may be due to CAD.  She has not had prior cath.  I would therefore advise cath prior to [facility] implant.  I have discussed with Dr Gwenlyn Found this am.  He would like to see her in the office on 05/20/18 (his office to arrange).  He will plan tentatively cath 05/22/18 prior to [facility] implant  I have discussed with patient and spouse who are willing to proceed. If her symptoms worsen in the interim, she should go to the ED.  Thompson Grayer MD, [facility] 05/08/2018 10:29  AM

## 2018-05-08 NOTE — Progress Notes (Signed)
PCP: Eustaquio Maize, MD Primary Cardiologist: Dr Gwenlyn Found Primary EP: Dr Rayann Heman  [name] is a 84 y.o. female who presents today for routine electrophysiology followup.  Since last being seen in our clinic, the patient reports doing reasonably well.  Her husband reports that her memory has been worsening lately.   She was recently at [facility] ED with chest pain.  She reports precordial pain with radiation into her back.  This has resolved over the past 2 days.  She is chronically fatigued, weak, and unsteady.  Her heart rates are now lower. Today, she denies symptoms of palpitations,  lower extremity edema, dizziness, presyncope, or syncope.  The patient is otherwise without complaint today.   Past Medical History:  Diagnosis Date  . Acute respiratory failure with hypoxia ([place]) 10/23/2015  . Allergic rhinitis    PT. DENIES  . Anxiety   . Aortic insufficiency    Echo 04/29/2018: EF 65-70, mild AS (mean 13), mod AI, Asc Aorta 42 mm (mildly dilated), mild LAE, PASP 41, pericardium normal in appearance.   . Arthritis    NECK  . Ataxia   . Bradycardia    primarily nocturnal  . Burning tongue syndrome 25 years  . Cancer ([place]) dx'd 06/2016   liver  . Cataract   . Cerebellar degeneration ([place])   . Chronic urinary tract infection   . Complication of anesthesia    low o2 sats, coded 30 years ago  . CVA (cerebral infarction) 05/2003  . Depression   . Encounter for antineoplastic chemotherapy 10/09/2016  . Gait disorder   . Gastric polyps   . GERD (gastroesophageal reflux disease)   . Goals of care, counseling/discussion 10/09/2016  . High cholesterol   . History of pericarditis   . Hyperlipidemia   . Hypotension   . Hypothyroidism   . IBS (irritable bowel syndrome)   . Obesity   . Paroxysmal atrial fibrillation ([name])    chads2vasc score is 6,  she is felt to be a poor candidate for anticoagulation  . Pericarditis   . Personal history of arterial venous malformation (AVM)    right  side of face  . Seizure disorder ([place])   . Seizures ([place]) 2003   " smelling"- Gabapentin "no problem"  . Shortness of breath dyspnea    with exertion  . Sternum fx 10/27/2013  . Stroke [facility]) 5 years ago   Right side of face weak, slurred speach-   . Thyroid disease   . TIA (transient ischemic attack)   . UTI (lower urinary tract infection) 03/27/2016   "frequently"   Past Surgical History:  Procedure Laterality Date  . APPENDECTOMY  84 years old  . COLONOSCOPY  2006, 2009  . COLONOSCOPY WITH PROPOFOL N/A 03/28/2016   Procedure: COLONOSCOPY WITH PROPOFOL;  Surgeon: Gatha Mayer, MD;  Location: [place];  Service: Endoscopy;  Laterality: N/A;  . cyst removed  35 years ago  . EP IMPLANTABLE DEVICE N/A 01/06/2015   Procedure: Loop Recorder Insertion;  Surgeon: Thompson Grayer, MD;  Location: [place] [facility];  Service: Cardiovascular;  Laterality: N/A;  . EYE SURGERY Right    Cataract  . IR ANGIOGRAM SELECTIVE EACH ADDITIONAL VESSEL  11/28/2016  . IR ANGIOGRAM SELECTIVE EACH ADDITIONAL VESSEL  11/28/2016  . IR ANGIOGRAM SELECTIVE EACH ADDITIONAL VESSEL  11/28/2016  . IR ANGIOGRAM SELECTIVE EACH ADDITIONAL VESSEL  11/28/2016  . IR ANGIOGRAM SELECTIVE EACH ADDITIONAL VESSEL  12/13/2016  . IR ANGIOGRAM SELECTIVE EACH ADDITIONAL  VESSEL  12/13/2016  . IR ANGIOGRAM SELECTIVE EACH ADDITIONAL VESSEL  01/09/2017  . IR ANGIOGRAM VISCERAL SELECTIVE  11/28/2016  . IR ANGIOGRAM VISCERAL SELECTIVE  11/28/2016  . IR ANGIOGRAM VISCERAL SELECTIVE  12/13/2016  . IR ANGIOGRAM VISCERAL SELECTIVE  12/13/2016  . IR ANGIOGRAM VISCERAL SELECTIVE  01/09/2017  . IR EMBO ARTERIAL NOT HEMORR HEMANG INC GUIDE ROADMAPPING  11/28/2016  . IR EMBO TUMOR ORGAN ISCHEMIA INFARCT INC GUIDE ROADMAPPING  12/13/2016  . IR EMBO TUMOR ORGAN ISCHEMIA INFARCT INC GUIDE ROADMAPPING  01/09/2017  . IR IVC FILTER PLMT / S&I /IMG GUID/MOD SED  04/24/2017  . IR RADIOLOGIST EVAL & MGMT  11/06/2016  . IR RADIOLOGIST EVAL & MGMT  01/02/2017  . IR  RADIOLOGIST EVAL & MGMT  02/05/2017  . IR RADIOLOGIST EVAL & MGMT  05/02/2017  . IR RADIOLOGIST EVAL & MGMT  10/17/2017  . IR US GUIDE VASC ACCESS RIGHT  11/28/2016  . IR US GUIDE VASC ACCESS RIGHT  12/13/2016  . IR US GUIDE VASC ACCESS RIGHT  01/09/2017  . KNEE ARTHROSCOPY Right 11/14/2006  . KNEE ARTHROSCOPY Bilateral 5 and 6 years ago  . KNEE ARTHROSCOPY WITH LATERAL MENISECTOMY  07/03/2012   Procedure: KNEE ARTHROSCOPY WITH LATERAL MENISECTOMY;  Surgeon: Magnus Sinning, MD;  [facility];  Service: Orthopedics;  Laterality: Left;  with Partial Lateral Menisectomy and Medial Menisectomy. Shaving of medial and lateral femoral condyles. Shaving of patella. Removal of a loose body  . tibial and fibular internal fixation Left   . TOTAL ABDOMINAL HYSTERECTOMY  84 years old  . UPPER GASTROINTESTINAL ENDOSCOPY  2009, 2013    ROS- all systems are reviewed and negatives except as per HPI above  Current Outpatient Medications  Medication Sig Dispense Refill  . ALPRAZolam (XANAX) 0.25 MG tablet Take 1 tablet (0.25 mg total) by mouth 2 (two) times daily as needed for anxiety. 30 tablet 1  . amitriptyline (ELAVIL) 25 MG tablet Take 1 tablet (25 mg total) by mouth at bedtime. 90 tablet 1  . capecitabine (XELODA) 500 MG tablet Take 3 Tablets (1500mg ) by mouth twice daily after a meal. Take on days 1-14 of each 28d cycle 84 tablet 2  . cephALEXin (KEFLEX) 500 MG capsule Take 1 capsule (500 mg total) by mouth 2 (two) times daily. 10 capsule 0  . escitalopram (LEXAPRO) 20 MG tablet Take 1 tablet (20 mg total) by mouth at bedtime. 90 tablet 1  . fluticasone (FLONASE) 50 MCG/ACT nasal spray Place 2 sprays into both nostrils at bedtime. 16 g 3  . furosemide (LASIX) 20 MG tablet TAKE 1 TABLET DAILY AS NEEDED FOR FLUID RETENTION (Patient taking differently: Take 20 mg by mouth as needed for fluid or edema. TAKE 1 TABLET DAILY AS NEEDED FOR FLUID RETENTION) 90 tablet 0  . gabapentin (NEURONTIN) 400 MG capsule  Take 1-2 capsules (400-800 mg total) by mouth 3 (three) times daily. (Patient taking differently: Take 800 mg by mouth 2 (two) times daily. ) 720 capsule 1  . hydroxypropyl methylcellulose / hypromellose (ISOPTO TEARS / GONIOVISC) 2.5 % ophthalmic solution Place 1 drop into both eyes as needed.     Marland Kitchen levothyroxine (SYNTHROID, LEVOTHROID) 75 MCG tablet TAKE 1 TABLET DAILY BEFORE BREAKFAST (Patient taking differently: Take 75 mcg by mouth daily before breakfast. ) 90 tablet 1  . lovastatin (MEVACOR) 40 MG tablet Take 1 tablet (40 mg total) by mouth at bedtime. 90 tablet 1  . Melatonin 3 MG TBDP Take 3-6 mg  by mouth at bedtime as needed. (Patient taking differently: Take 3-6 mg by mouth at bedtime as needed (FOR SLEEP). ) 60 tablet 1  . omeprazole (PRILOSEC) 40 MG capsule Take 1 capsule (40 mg total) by mouth daily. 90 capsule 1  . ondansetron (ZOFRAN) 8 MG tablet Take 1 tablet (8 mg total) by mouth every 8 (eight) hours as needed for nausea or vomiting. 90 tablet 1  . OXcarbazepine (TRILEPTAL) 150 MG tablet Take 1 tablet (150 mg total) by mouth 2 (two) times daily. 60 tablet 3  . oxyCODONE-acetaminophen (PERCOCET) 5-325 MG tablet Take 1 tablet by mouth every 8 (eight) hours as needed for severe pain. 10 tablet 0  . rivaroxaban (XARELTO) 20 MG TABS tablet Take 1 tablet (20 mg total) by mouth daily with supper.    . temozolomide (TEMODAR) 140 MG capsule Take 2 capsules (280mg ) by mouth daily for 5 days on days 10-14 of each 28d cycle. May take on empty stomach at bedtime to decrease N/V 10 capsule 2   No current facility-administered medications for this visit.     Physical Exam: Vitals:   05/08/18 0957  BP: (!) 116/54  Pulse: (!) 47  SpO2: 97%  Weight: 231 lb (104.8 kg)  Height: 5\' 4"  (1.626 m)    GEN- The patient is chronically ill appearing, alert and oriented x 3 today.   Head- normocephalic, atraumatic Eyes-  Sclera clear, conjunctiva pink Ears- hearing intact Oropharynx- clear Lungs-  Clear to ausculation bilaterally, normal work of breathing Heart- bradycardiac regular rhythm, no murmurs, rubs or gallops, PMI not laterally displaced GI- soft, NT, ND, + BS Extremities- no clubbing, cyanosis, or edema  Wt Readings from Last 3 Encounters:  05/08/18 231 lb (104.8 kg)  05/04/18 234 lb (106.1 kg)  04/25/18 234 lb (106.1 kg)    EKG tracing ordered today is personally reviewed and shows junctional rhythm 47 bpm, nonspecific St/T changes  Assessment and Plan:  1. Sinus bradycardia Her bradycardia is more pronounced today. She has fatigue and chronic debility.  It is difficult to know if her bradycardia is the cause for symptoms or not. The patient has symptomatic bradycardia.  I would therefore recommend pacemaker implantation at this time.  Risks, benefits, alternatives to pacemaker implantation were discussed in detail with the patient today. The patient understands that the risks include but are not limited to bleeding, infection, pneumothorax, perforation, tamponade, vascular damage, renal failure, MI, stroke, death,  and lead dislodgement and wishes to proceed. We will therefore schedule the procedure at for 05/22/18 to follow cath (see below).  I will also plan ILR removal at the same time.  2. Paroxysmal atrial fibrillation afib burden 0.3%, longest episode 20 minutes On xarelto  3. HTN Stable No change required today  4. Lung CA Followed by Dr [name] metastatic per patient  5. Chest pain I am worried that this may be due to CAD.  She has not had prior cath.  I would therefore advise cath prior to [facility] implant.  I have discussed with Dr Gwenlyn Found this am.  He would like to see her in the office on 05/20/18 (his office to arrange).  He will plan tentatively cath 05/22/18 prior to [facility] implant  I have discussed with patient and spouse who are willing to proceed. If her symptoms worsen in the interim, she should go to the ED.  Thompson Grayer MD, [facility] 05/08/2018 10:29  AM

## 2018-05-08 NOTE — Patient Instructions (Addendum)
Medication Instructions:  Your physician recommends that you continue on your current medications as directed. Please refer to the Current Medication list given to you today.  Labwork: None ordered.  Testing/Procedures: Your physician has recommended that you have a pacemaker inserted. A pacemaker is a small device that is placed under the skin of your chest or abdomen to help control abnormal heart rhythms. This device uses electrical pulses to prompt the heart to beat at a normal rate. Pacemakers are used to treat heart rhythms that are too slow. Wire (leads) are attached to the pacemaker that goes into the chambers of you heart. This is done in the hospital and usually requires and overnight stay. Please see the instruction sheet given to you today for more information.  Follow-Up: You will follow up with device clinic 10-14 days after your procedure for a wound check.  You will follow up with Dr. Rayann Heman 91 days after your procedure.    PACEMAKER INSTRUCTION:  Please arrive at the [facility] main entrance of [facility] hospital at:  8:00 am on May 22, 2018 Use the CHG surgical scrub as directed Do not eat or drink after midnight prior to procedure Plan for one night stay You will need someone to drive you home at discharge  If you need a refill on your cardiac medications before your next appointment, please call your pharmacy.   Pacemaker Implantation, Adult Pacemaker implantation is a procedure to place a pacemaker inside your chest. A pacemaker is a small computer that sends electrical signals to the heart and helps your heart beat normally. A pacemaker also stores information about your heart rhythms. You may need pacemaker implantation if you:  Have a slow heartbeat (bradycardia).  Faint (syncope).  Have shortness of breath (dyspnea) due to heart problems.  The pacemaker attaches to your heart through a wire, called a lead. Sometimes just one lead is needed. Other  times, there will be two leads. There are two types of pacemakers:  Transvenous pacemaker. This type is placed under the skin or muscle of your chest. The lead goes through a vein in the chest area to reach the inside of the heart.  Epicardial pacemaker. This type is placed under the skin or muscle of your chest or belly. The lead goes through your chest to the outside of the heart.  Tell a health care provider about:  Any allergies you have.  All medicines you are taking, including vitamins, herbs, eye drops, creams, and over-the-counter medicines.  Any problems you or family members have had with anesthetic medicines.  Any blood or bone disorders you have.  Any surgeries you have had.  Any medical conditions you have.  Whether you are pregnant or may be pregnant. What are the risks? Generally, this is a safe procedure. However, problems may occur, including:  Infection.  Bleeding.  Failure of the pacemaker or the lead.  Collapse of a lung or bleeding into a lung.  Blood clot inside a blood vessel with a lead.  Damage to the heart.  Infection inside the heart (endocarditis).  Allergic reactions to medicines.  What happens before the procedure? Staying hydrated Follow instructions from your health care provider about hydration, which may include:  Up to 2 hours before the procedure - you may continue to drink clear liquids, such as water, clear fruit juice, black coffee, and plain tea.  Eating and drinking restrictions Follow instructions from your health care provider about eating and drinking, which may include:  8 hours before the procedure - stop eating heavy meals or foods such as meat, fried foods, or fatty foods.  6 hours before the procedure - stop eating light meals or foods, such as toast or cereal.  6 hours before the procedure - stop drinking milk or drinks that contain milk.  2 hours before the procedure - stop drinking clear  liquids.  Medicines  Ask your health care provider about: ? Changing or stopping your regular medicines. This is especially important if you are taking diabetes medicines or blood thinners. ? Taking medicines such as aspirin and ibuprofen. These medicines can thin your blood. Do not take these medicines before your procedure if your health care provider instructs you not to.  You may be given antibiotic medicine to help prevent infection. General instructions  You will have a heart evaluation. This may include an electrocardiogram (ECG), chest X-ray, and heart imaging (echocardiogram,  or echo) tests.  You will have blood tests.  Do not use any products that contain nicotine or tobacco, such as cigarettes and e-cigarettes. If you need help quitting, ask your health care provider.  Plan to have someone take you home from the hospital or clinic.  If you will be going home right after the procedure, plan to have someone with you for 24 hours.  Ask your health care provider how your surgical site will be marked or identified. What happens during the procedure?  To reduce your risk of infection: ? Your health care team will wash or sanitize their hands. ? Your skin will be washed with soap. ? Hair may be removed from the surgical area.  An IV tube will be inserted into one of your veins.  You will be given one or more of the following: ? A medicine to help you relax (sedative). ? A medicine to numb the area (local anesthetic). ? A medicine to make you fall asleep (general anesthetic).  If you are getting a transvenous pacemaker: ? An incision will be made in your upper chest. ? A pocket will be made for the pacemaker. It may be placed under the skin or between layers of muscle. ? The lead will be inserted into a blood vessel that returns to the heart. ? While X-rays are taken by an imaging machine (fluoroscopy), the lead will be advanced through the vein to the inside of your  heart. ? The other end of the lead will be tunneled under the skin and attached to the pacemaker.  If you are getting an epicardial pacemaker: ? An incision will be made near your ribs or breastbone (sternum) for the lead. ? The lead will be attached to the outside of your heart. ? Another incision will be made in your chest or upper belly to create a pocket for the pacemaker. ? The free end of the lead will be tunneled under the skin and attached to the pacemaker.  The transvenous or epicardial pacemaker will be tested. Imaging studies may be done to check the lead position.  The incisions will be closed with stitches (sutures), adhesive strips, or skin glue.  Bandages (dressing) will be placed over the incisions. The procedure may vary among health care providers and hospitals. What happens after the procedure?  Your blood pressure, heart rate, breathing rate, and blood oxygen level will be monitored until the medicines you were given have worn off.  You will be given antibiotics and pain medicine.  ECG and chest x-rays will be done.  You  will wear a continuous type of ECG (Holter monitor) to check your heart rhythm.  Your health care provider willprogram the pacemaker.  Do not drive for 24 hours if you received a sedative. This information is not intended to replace advice given to you by your health care provider. Make sure you discuss any questions you have with your health care provider. Document Released: 07/06/2002 Document Revised: 02/03/2016 Document Reviewed: 12/28/2015 Elsevier Interactive Patient Education  [employer].

## 2018-05-12 ENCOUNTER — Encounter: Payer: Self-pay | Admitting: Family

## 2018-05-12 ENCOUNTER — Ambulatory Visit (INDEPENDENT_AMBULATORY_CARE_PROVIDER_SITE_OTHER): Payer: Medicare Other | Admitting: Family

## 2018-05-12 VITALS — BP 127/69 | HR 50 | Temp 96.7°F

## 2018-05-12 DIAGNOSIS — N3001 Acute cystitis with hematuria: Secondary | ICD-10-CM | POA: Diagnosis not present

## 2018-05-12 DIAGNOSIS — R300 Dysuria: Secondary | ICD-10-CM

## 2018-05-12 LAB — URINALYSIS, COMPLETE
Bilirubin, UA: NEGATIVE
Glucose, UA: NEGATIVE
Ketones, UA: NEGATIVE
NITRITE UA: POSITIVE — AB
PH UA: 6 (ref 5.0–7.5)
Specific Gravity, UA: 1.015 (ref 1.005–1.030)
Urobilinogen, Ur: 1 mg/dL (ref 0.2–1.0)

## 2018-05-12 LAB — MICROSCOPIC EXAMINATION

## 2018-05-12 MED ORDER — CIPROFLOXACIN HCL 500 MG PO TABS: 500.0000 mg | ORAL_TABLET | Freq: Two times a day (BID) | ORAL | 0 refills | Status: DC

## 2018-05-12 NOTE — Progress Notes (Signed)
   Subjective:    Patient ID: [name], female    DOB: Nov 24, 1933, 84 y.o.   MRN: [id number]  Chief Complaint  Patient presents with  . pain passing urine   Pt presents to the office today for recurrent dysuria. She was treated for a UTI on 04/23/18. She was started on Macrobid, but was changed to Keflex.  Dysuria   This is a recurrent problem. The current episode started in the past 7 days. The problem occurs intermittently. The problem has been rapidly worsening. The quality of the pain is described as burning. Associated symptoms include urgency. Pertinent negatives include no flank pain, frequency, hematuria, hesitancy, nausea or vomiting. The treatment provided mild relief.      Review of Systems  Gastrointestinal: Negative for nausea and vomiting.  Genitourinary: Positive for dysuria and urgency. Negative for flank pain, frequency, hematuria and hesitancy.  All other systems reviewed and are negative.      Objective:   Physical Exam  Constitutional: She is oriented to person, place, and time. She appears well-developed and well-nourished. No distress.  HENT:  Head: Normocephalic.  Cardiovascular: Normal rate, regular rhythm and intact distal pulses.  Murmur heard. Pulmonary/Chest: Effort normal and breath sounds normal. No respiratory distress. She has no wheezes.  Abdominal: Soft. Bowel sounds are normal. She exhibits no distension. There is no tenderness.  Musculoskeletal: She exhibits no edema (trace BLe) or tenderness.  In Wheelchair, generalized weakness   Neurological: She is alert and oriented to person, place, and time. She has normal reflexes. No cranial nerve deficit.  Skin: Skin is warm and dry.  Psychiatric: She has a normal mood and affect. Her behavior is normal. Judgment and thought content normal.  Vitals reviewed.     BP 127/69   Pulse (!) 50   Temp (!) 96.7 F (35.9 C) (Oral)      Assessment & Plan:  [name] comes in today with  chief complaint of pain passing urine   Diagnosis and orders addressed:  1. Dysuria - Urinalysis, Complete - Urine Culture  2. Acute cystitis with hematuria Force fluids RTO prn Culture pending - ciprofloxacin (CIPRO) 500 MG tablet; Take 1 tablet (500 mg total) by mouth 2 (two) times daily.  Dispense: 14 tablet; Refill: 0   Rebekah Dun, FNP

## 2018-05-12 NOTE — Patient Instructions (Signed)

## 2018-05-14 ENCOUNTER — Ambulatory Visit (INDEPENDENT_AMBULATORY_CARE_PROVIDER_SITE_OTHER): Payer: Medicare Other | Admitting: *Deleted

## 2018-05-14 DIAGNOSIS — C259 Malignant neoplasm of pancreas, unspecified: Secondary | ICD-10-CM | POA: Diagnosis not present

## 2018-05-14 DIAGNOSIS — D696 Thrombocytopenia, unspecified: Secondary | ICD-10-CM | POA: Diagnosis not present

## 2018-05-14 DIAGNOSIS — R002 Palpitations: Secondary | ICD-10-CM | POA: Diagnosis not present

## 2018-05-14 DIAGNOSIS — I309 Acute pericarditis, unspecified: Secondary | ICD-10-CM | POA: Diagnosis not present

## 2018-05-14 DIAGNOSIS — C3490 Malignant neoplasm of unspecified part of unspecified bronchus or lung: Secondary | ICD-10-CM | POA: Diagnosis not present

## 2018-05-14 DIAGNOSIS — R339 Retention of urine, unspecified: Secondary | ICD-10-CM | POA: Diagnosis not present

## 2018-05-14 DIAGNOSIS — C787 Secondary malignant neoplasm of liver and intrahepatic bile duct: Secondary | ICD-10-CM | POA: Diagnosis not present

## 2018-05-14 LAB — URINE CULTURE

## 2018-05-15 NOTE — Progress Notes (Signed)
Carelink Summary Report / Loop Recorder 

## 2018-05-16 ENCOUNTER — Ambulatory Visit (INDEPENDENT_AMBULATORY_CARE_PROVIDER_SITE_OTHER): Payer: Medicare Other | Admitting: Pediatrics

## 2018-05-16 ENCOUNTER — Ambulatory Visit: Payer: Medicare Other | Admitting: Pediatrics

## 2018-05-16 ENCOUNTER — Encounter: Payer: Self-pay | Admitting: Pediatrics

## 2018-05-16 VITALS — BP 126/71 | HR 47 | Temp 97.1°F | Ht 64.0 in

## 2018-05-16 DIAGNOSIS — I208 Other forms of angina pectoris: Secondary | ICD-10-CM | POA: Diagnosis not present

## 2018-05-16 DIAGNOSIS — G119 Hereditary ataxia, unspecified: Secondary | ICD-10-CM | POA: Diagnosis not present

## 2018-05-16 DIAGNOSIS — R5383 Other fatigue: Secondary | ICD-10-CM

## 2018-05-16 DIAGNOSIS — N3090 Cystitis, unspecified without hematuria: Secondary | ICD-10-CM | POA: Diagnosis not present

## 2018-05-16 DIAGNOSIS — G8929 Other chronic pain: Secondary | ICD-10-CM

## 2018-05-16 DIAGNOSIS — E871 Hypo-osmolality and hyponatremia: Secondary | ICD-10-CM | POA: Diagnosis not present

## 2018-05-16 DIAGNOSIS — R6884 Jaw pain: Secondary | ICD-10-CM | POA: Diagnosis not present

## 2018-05-16 NOTE — Progress Notes (Signed)
  Subjective:   Patient ID: [name], female    DOB: Jan 14, 1934, 84 y.o.   MRN: [id number] CC: Medical Management of Chronic Issues  HPI: [name] is a 84 y.o. female   Here with husband.  Remains fatigued.  Feeling weaker.  Not able to weight-bear to help with transfers that she is usually able to do at home.  Sometimes using walker before at home, has not been doing that for the last week.  Upcoming catheterization and pacemaker placement for bradycardia.  She is sleeping more than usual.  Treated for cystitis on 10/14.  Symptoms have resolved.  Still has a few more days of antibiotics.   No fevers, abdominal pain, nausea.  Appetite has been down.  Relevant past medical, surgical, family and social history reviewed. Allergies and medications reviewed and updated. Social History   Tobacco Use  Smoking Status Former Smoker  . Packs/day: 0.25  . Years: 10.00  . Pack years: 2.50  . Types: Cigarettes  . Last attempt to quit: 07/31/1975  . Years since quitting: 42.8  Smokeless Tobacco Never Used   ROS: Per HPI   Objective:    BP 126/71   Pulse (!) 47   Temp (!) 97.1 F (36.2 C) (Oral)   Ht _0  (1.626 m)   BMI 39.65 kg/m     Gen: NAD, alert, cooperative with exam, NCAT EYES: EOMI, no conjunctival injection, or no icterus CV: NRRR, normal S1/S2, no murmur, distal pulses 2+ b/l Resp: CTABL, no wheezes, normal WOB Abd: +BS, soft, NTND. no guarding or organomegaly Ext: No edema, warm Neuro: Alert and oriented, answering questions appropriately. MSK: normal muscle bulk Psych: Affect down, decreased eye contact.  Assessment & Plan:  [name] was seen today for medical management of chronic issues.  Diagnoses and all orders for this visit:  Other fatigue We will check labs.  Recently started on ox arbamazepine for trigeminal neuralgia.  Also recently treated for cystitis.  Has bradycardia, upcoming pacemaker placement and cardiac evaluation.  Would like to start  physical therapy, will start after her upcoming procedure.  Any worsening symptoms let me know. -     CMP14+EGFR -     CBC with Differential/Platelet  Hyponatremia -     CMP14+EGFR -     CBC with Differential/Platelet  Cystitis [name] recently started antibiotics.  Symptoms have improved.  Chronic jaw pain Continue Trileptal  Cerebellar ataxia ([name]) Wheelchair-bound much of the time.  Due to gait instability not able to use walker or cane for mobility.  The wheelchair does help with her dressing, toileting.  She is able to maneuver the wheelchair some with her arms.  Follow up plan: Return in about 8 weeks (around 07/11/2018). Rebekah Found, MD [place]

## 2018-05-17 ENCOUNTER — Other Ambulatory Visit: Payer: Medicare Other

## 2018-05-17 ENCOUNTER — Other Ambulatory Visit: Payer: Self-pay | Admitting: Family Medicine

## 2018-05-17 DIAGNOSIS — C7B8 Other secondary neuroendocrine tumors: Principal | ICD-10-CM

## 2018-05-17 DIAGNOSIS — E875 Hyperkalemia: Secondary | ICD-10-CM

## 2018-05-17 DIAGNOSIS — M542 Cervicalgia: Secondary | ICD-10-CM

## 2018-05-17 DIAGNOSIS — C7A8 Other malignant neuroendocrine tumors: Secondary | ICD-10-CM

## 2018-05-17 LAB — BMP8+EGFR
BUN/Creatinine Ratio: 12 (ref 12–28)
BUN: 10 mg/dL (ref 8–27)
CO2: 22 mmol/L (ref 20–29)
CREATININE: 0.86 mg/dL (ref 0.57–1.00)
Calcium: 9.2 mg/dL (ref 8.7–10.3)
Chloride: 95 mmol/L — ABNORMAL LOW (ref 96–106)
GFR, EST AFRICAN AMERICAN: 73 mL/min/{1.73_m2} (ref >59)
GFR, EST NON AFRICAN AMERICAN: 63 mL/min/{1.73_m2} (ref >59)
GLUCOSE: 128 mg/dL — AB (ref 65–99)
POTASSIUM: 5.2 mmol/L (ref 3.5–5.2)
SODIUM: 134 mmol/L (ref 134–144)

## 2018-05-19 LAB — CBC WITH DIFFERENTIAL/PLATELET
BASOS ABS: 0 10*3/uL (ref 0.0–0.2)
Basos: 1 %
EOS (ABSOLUTE): 0.2 10*3/uL (ref 0.0–0.4)
Eos: 5 %
Hematocrit: 36.5 % (ref 34.0–46.6)
Hemoglobin: 12.2 g/dL (ref 11.1–15.9)
IMMATURE GRANS (ABS): 0 10*3/uL (ref 0.0–0.1)
IMMATURE GRANULOCYTES: 1 %
LYMPHS: 22 %
Lymphocytes Absolute: 0.8 10*3/uL (ref 0.7–3.1)
MCH: 34.8 pg — AB (ref 26.6–33.0)
MCHC: 33.4 g/dL (ref 31.5–35.7)
MCV: 104 fL — ABNORMAL HIGH (ref 79–97)
Monocytes Absolute: 0.6 10*3/uL (ref 0.1–0.9)
Monocytes: 17 %
NEUTROS PCT: 54 %
Neutrophils Absolute: 1.9 10*3/uL (ref 1.4–7.0)
PLATELETS: 124 10*3/uL — AB (ref 150–450)
RBC: 3.51 x10E6/uL — AB (ref 3.77–5.28)
RDW: 15.9 % — ABNORMAL HIGH (ref 12.3–15.4)
WBC: 3.4 10*3/uL (ref 3.4–10.8)

## 2018-05-19 LAB — CMP14+EGFR
A/G RATIO: 1.7 (ref 1.2–2.2)
ALT: 14 IU/L (ref 0–32)
AST: 31 IU/L (ref 0–40)
Albumin: 4 g/dL (ref 3.5–4.7)
Alkaline Phosphatase: 86 IU/L (ref 39–117)
BILIRUBIN TOTAL: 0.6 mg/dL (ref 0.0–1.2)
BUN/Creatinine Ratio: 14 (ref 12–28)
BUN: 12 mg/dL (ref 8–27)
CHLORIDE: 99 mmol/L (ref 96–106)
CO2: 21 mmol/L (ref 20–29)
Calcium: 10 mg/dL (ref 8.7–10.3)
Creatinine, Ser: 0.88 mg/dL (ref 0.57–1.00)
GFR calc Af Amer: 71 mL/min/{1.73_m2} (ref >59)
GFR calc non Af Amer: 61 mL/min/{1.73_m2} (ref >59)
GLUCOSE: 108 mg/dL — AB (ref 65–99)
Globulin, Total: 2.4 g/dL (ref 1.5–4.5)
POTASSIUM: 6.2 mmol/L — AB (ref 3.5–5.2)
Sodium: 138 mmol/L (ref 134–144)
Total Protein: 6.4 g/dL (ref 6.0–8.5)

## 2018-05-20 ENCOUNTER — Encounter (HOSPITAL_COMMUNITY): Payer: Self-pay

## 2018-05-20 ENCOUNTER — Ambulatory Visit: Payer: Medicare Other | Admitting: Cardiovascular Disease

## 2018-05-20 ENCOUNTER — Emergency Department (HOSPITAL_COMMUNITY)
Admission: EM | Admit: 2018-05-20 | Discharge: 2018-05-20 | Disposition: A | Discharge status: Home / Self Care | Payer: Medicare Other | Attending: Emergency Medicine | Admitting: Emergency Medicine

## 2018-05-20 ENCOUNTER — Telehealth: Payer: Self-pay | Admitting: *Deleted

## 2018-05-20 ENCOUNTER — Other Ambulatory Visit: Payer: Self-pay

## 2018-05-20 ENCOUNTER — Telehealth: Payer: Self-pay | Admitting: Cardiovascular Disease

## 2018-05-20 ENCOUNTER — Emergency Department (HOSPITAL_COMMUNITY): Payer: Medicare Other

## 2018-05-20 DIAGNOSIS — S99912A Unspecified injury of left ankle, initial encounter: Secondary | ICD-10-CM | POA: Diagnosis not present

## 2018-05-20 DIAGNOSIS — E039 Hypothyroidism, unspecified: Secondary | ICD-10-CM | POA: Insufficient documentation

## 2018-05-20 DIAGNOSIS — S99922A Unspecified injury of left foot, initial encounter: Secondary | ICD-10-CM | POA: Diagnosis not present

## 2018-05-20 DIAGNOSIS — W1811XA Fall from or off toilet without subsequent striking against object, initial encounter: Secondary | ICD-10-CM | POA: Diagnosis not present

## 2018-05-20 DIAGNOSIS — R001 Bradycardia, unspecified: Secondary | ICD-10-CM | POA: Diagnosis not present

## 2018-05-20 DIAGNOSIS — Z87891 Personal history of nicotine dependence: Secondary | ICD-10-CM | POA: Diagnosis not present

## 2018-05-20 DIAGNOSIS — Y9389 Activity, other specified: Secondary | ICD-10-CM | POA: Diagnosis not present

## 2018-05-20 DIAGNOSIS — S8992XA Unspecified injury of left lower leg, initial encounter: Secondary | ICD-10-CM | POA: Diagnosis not present

## 2018-05-20 DIAGNOSIS — R52 Pain, unspecified: Secondary | ICD-10-CM | POA: Diagnosis not present

## 2018-05-20 DIAGNOSIS — Z79899 Other long term (current) drug therapy: Secondary | ICD-10-CM | POA: Diagnosis not present

## 2018-05-20 DIAGNOSIS — R0902 Hypoxemia: Secondary | ICD-10-CM | POA: Diagnosis not present

## 2018-05-20 DIAGNOSIS — I10 Essential (primary) hypertension: Secondary | ICD-10-CM | POA: Diagnosis not present

## 2018-05-20 DIAGNOSIS — Y92012 Bathroom of single-family (private) house as the place of occurrence of the external cause: Secondary | ICD-10-CM | POA: Diagnosis not present

## 2018-05-20 DIAGNOSIS — Y999 Unspecified external cause status: Secondary | ICD-10-CM | POA: Insufficient documentation

## 2018-05-20 DIAGNOSIS — S93402A Sprain of unspecified ligament of left ankle, initial encounter: Secondary | ICD-10-CM | POA: Diagnosis not present

## 2018-05-20 MED ORDER — ACETAMINOPHEN 325 MG PO TABS
650.0000 mg | ORAL_TABLET | Freq: Once | ORAL | Status: AC | PRN
  Administered 2018-05-20: 650 mg via ORAL
  Filled 2018-05-20: qty 2

## 2018-05-20 NOTE — Telephone Encounter (Signed)
Called patients husband and advised that they have to be seen before the Cath, patient was available tomorrow at 1:30, I notified Dr.Berry's nurse for today, I am unable to double book him for 1:30, so I will notify his nurse to do it.   Thanks!

## 2018-05-20 NOTE — Telephone Encounter (Signed)
Pt scheduled for tomorrow 10/23 with Dr. Gwenlyn Found.

## 2018-05-20 NOTE — Telephone Encounter (Signed)
  Husband is calling because [name] had to miss her appt this morning with Dr. Gwenlyn Found because she fell and they are now at the ER. He is calling because he wants to see if they will still be able to do the Cath on 05/22/18 or if she needs to see Dr Gwenlyn Found before the procedure.

## 2018-05-20 NOTE — ED Provider Notes (Signed)
[facility] EMERGENCY DEPARTMENT Provider Note   CSN: [id number] Arrival date & time: 05/20/18  0930     History   Chief Complaint Chief Complaint  Patient presents with  . Fall    HPI [name] is a 84 y.o. female.  HPI  84 year old female who is wheelchair-bound due to cerebellar degeneration presents after a fall off the toilet.  She was needing to all of a sudden go the bathroom and had to do stuff by herself instead of having help by her husband.  She ended up sliding off the commode and injured her left foot and ankle.  Pain originally was about a 10 out of 10 but currently is about a 5 out of 10.  Has not taken anything for pain.  No new weakness or numbness.  Did not hit her head.  Past Medical History:  Diagnosis Date  . Acute respiratory failure with hypoxia ([place]) 10/23/2015  . Allergic rhinitis    PT. DENIES  . Anxiety   . Aortic insufficiency    Echo 04/29/2018: EF 65-70, mild AS (mean 13), mod AI, Asc Aorta 42 mm (mildly dilated), mild LAE, PASP 41, pericardium normal in appearance.   . Arthritis    NECK  . Ataxia   . Bradycardia    primarily nocturnal  . Burning tongue syndrome 25 years  . Cancer ([place]) dx'd 06/2016   liver  . Cataract   . Cerebellar degeneration ([place])   . Chronic urinary tract infection   . Complication of anesthesia    low o2 sats, coded 30 years ago  . CVA (cerebral infarction) 05/2003  . Depression   . Encounter for antineoplastic chemotherapy 10/09/2016  . Gait disorder   . Gastric polyps   . GERD (gastroesophageal reflux disease)   . Goals of care, counseling/discussion 10/09/2016  . High cholesterol   . History of pericarditis   . Hyperlipidemia   . Hypotension   . Hypothyroidism   . IBS (irritable bowel syndrome)   . Obesity   . Paroxysmal atrial fibrillation ([name])    chads2vasc score is 6,  she is felt to be a poor candidate for anticoagulation  . Pericarditis   . Personal history of arterial venous malformation  (AVM)    right side of face  . Seizure disorder ([place])   . Seizures ([place]) 2003   " smelling"- Gabapentin "no problem"  . Shortness of breath dyspnea    with exertion  . Sternum fx 10/27/2013  . Stroke [facility]) 5 years ago   Right side of face weak, slurred speach-   . Thyroid disease   . TIA (transient ischemic attack)   . UTI (lower urinary tract infection) 03/27/2016   "frequently"    Patient Active Problem List   Diagnosis Date Noted  . Acute chest pain   . Primary malignant neoplasm of lung metastatic to other site [facility])   . Macrocytic anemia   . Lung cancer ([place]) 03/23/2018  . Dehydration with hyponatremia 03/23/2018  . Thrombocytopenia ([place]) 03/23/2018  . Pericarditis 03/23/2018  . Osteoarthritis of knee 01/10/2018  . Hypotension 12/19/2017  . Pulmonary embolism ([place]) 04/24/2017  . Pulmonary embolus ([place]) 04/23/2017  . Neuroendocrine carcinoma metastatic to liver ([place]) 12/13/2016  . Back pain 12/13/2016  . Chest pain 12/13/2016  . Goals of care, counseling/discussion 10/09/2016  . Encounter for antineoplastic chemotherapy 10/09/2016  . Neuroendocrine cancer ([place]) 08/23/2016  . Liver metastases ([place]) 08/23/2016  . Late effect of cerebrovascular accident (CVA) 08/02/2016  .  H/O: CVA (cerebrovascular accident) 07/14/2016  . Meningioma ([place]) 07/14/2016  . Asthmatic bronchitis 10/23/2015  . GAD (generalized anxiety disorder) 09/30/2015  . Peripheral edema 09/30/2015  . Insomnia 09/30/2015  . Localization-related partial epilepsy with simple partial seizures ([place]) 06/27/2015  . Allergic rhinitis 05/24/2015  . Syncope 01/06/2015  . Sinus bradycardia 01/03/2015  . Paroxysmal atrial fibrillation ([place]) 01/03/2015  . Fatigue 01/03/2015  . Morbid obesity ([place]) 01/03/2015  . Solitary pulmonary nodule 06/02/2014  . Thyroid nodule 05/08/2014  . Palpitation 05/07/2014  . Depression   . Seizure disorder ([place])   . Hypothyroidism   . Cerebellar degeneration ([place])   . Irritable  bowel syndrome 02/18/2008  . Hyperlipemia 09/10/2006  . Essential hypertension 09/10/2006  . GERD (gastroesophageal reflux disease) 09/10/2006  . Cerebral infarction ([place]) 05/31/2003    Past Surgical History:  Procedure Laterality Date  . APPENDECTOMY  84 years old  . COLONOSCOPY  2006, 2009  . COLONOSCOPY WITH PROPOFOL N/A 03/28/2016   Procedure: COLONOSCOPY WITH PROPOFOL;  Surgeon: Gatha Mayer, MD;  Location: [place];  Service: Endoscopy;  Laterality: N/A;  . cyst removed  35 years ago  . EP IMPLANTABLE DEVICE N/A 01/06/2015   Procedure: Loop Recorder Insertion;  Surgeon: Thompson Grayer, MD;  Location: [place] [facility];  Service: Cardiovascular;  Laterality: N/A;  . EYE SURGERY Right    Cataract  . IR ANGIOGRAM SELECTIVE EACH ADDITIONAL VESSEL  11/28/2016  . IR ANGIOGRAM SELECTIVE EACH ADDITIONAL VESSEL  11/28/2016  . IR ANGIOGRAM SELECTIVE EACH ADDITIONAL VESSEL  11/28/2016  . IR ANGIOGRAM SELECTIVE EACH ADDITIONAL VESSEL  11/28/2016  . IR ANGIOGRAM SELECTIVE EACH ADDITIONAL VESSEL  12/13/2016  . IR ANGIOGRAM SELECTIVE EACH ADDITIONAL VESSEL  12/13/2016  . IR ANGIOGRAM SELECTIVE EACH ADDITIONAL VESSEL  01/09/2017  . IR ANGIOGRAM VISCERAL SELECTIVE  11/28/2016  . IR ANGIOGRAM VISCERAL SELECTIVE  11/28/2016  . IR ANGIOGRAM VISCERAL SELECTIVE  12/13/2016  . IR ANGIOGRAM VISCERAL SELECTIVE  12/13/2016  . IR ANGIOGRAM VISCERAL SELECTIVE  01/09/2017  . IR EMBO ARTERIAL NOT HEMORR HEMANG INC GUIDE ROADMAPPING  11/28/2016  . IR EMBO TUMOR ORGAN ISCHEMIA INFARCT INC GUIDE ROADMAPPING  12/13/2016  . IR EMBO TUMOR ORGAN ISCHEMIA INFARCT INC GUIDE ROADMAPPING  01/09/2017  . IR IVC FILTER PLMT / S&I /IMG GUID/MOD SED  04/24/2017  . IR RADIOLOGIST EVAL & MGMT  11/06/2016  . IR RADIOLOGIST EVAL & MGMT  01/02/2017  . IR RADIOLOGIST EVAL & MGMT  02/05/2017  . IR RADIOLOGIST EVAL & MGMT  05/02/2017  . IR RADIOLOGIST EVAL & MGMT  10/17/2017  . IR US GUIDE VASC ACCESS RIGHT  11/28/2016  . IR US GUIDE VASC ACCESS RIGHT   12/13/2016  . IR US GUIDE VASC ACCESS RIGHT  01/09/2017  . KNEE ARTHROSCOPY Right 11/14/2006  . KNEE ARTHROSCOPY Bilateral 5 and 6 years ago  . KNEE ARTHROSCOPY WITH LATERAL MENISECTOMY  07/03/2012   Procedure: KNEE ARTHROSCOPY WITH LATERAL MENISECTOMY;  Surgeon: Magnus Sinning, MD;  Location: [facility];  Service: Orthopedics;  Laterality: Left;  with Partial Lateral Menisectomy and Medial Menisectomy. Shaving of medial and lateral femoral condyles. Shaving of patella. Removal of a loose body  . tibial and fibular internal fixation Left   . TOTAL ABDOMINAL HYSTERECTOMY  84 years old  . UPPER GASTROINTESTINAL ENDOSCOPY  2009, 2013     OB History   None      Home Medications    Prior to Admission medications   Medication Sig Start  Date End Date Taking? Authorizing Provider  ALPRAZolam (XANAX) 0.25 MG tablet Take 1 tablet (0.25 mg total) by mouth 2 (two) times daily as needed for anxiety. Patient taking differently: Take 0.25 mg by mouth 2 (two) times daily.  04/04/18  Yes Eustaquio Maize, MD  amitriptyline (ELAVIL) 25 MG tablet Take 1 tablet (25 mg total) by mouth at bedtime. 03/07/18  Yes Eustaquio Maize, MD  capecitabine (XELODA) 500 MG tablet Take 3 Tablets (1500mg ) by mouth twice daily after a meal. Take on days 1-14 of each 28d cycle 04/23/18  Yes Curt Bears, MD  escitalopram (LEXAPRO) 20 MG tablet Take 1 tablet (20 mg total) by mouth at bedtime. 03/07/18  Yes Eustaquio Maize, MD  fluticasone (FLONASE) 50 MCG/ACT nasal spray Place 2 sprays into both nostrils at bedtime. 08/08/17  Yes Eustaquio Maize, MD  furosemide (LASIX) 20 MG tablet TAKE 1 TABLET DAILY AS NEEDED FOR FLUID RETENTION Patient taking differently: Take 20 mg by mouth as needed for fluid or edema. TAKE 1 TABLET DAILY AS NEEDED FOR FLUID RETENTION 03/07/18  Yes Eustaquio Maize, MD  gabapentin (NEURONTIN) 400 MG capsule Take 1-2 capsules (400-800 mg total) by mouth 3 (three) times daily. Patient taking differently: Take 800  mg by mouth 3 (three) times daily.  03/07/18  Yes Eustaquio Maize, MD  hydroxypropyl methylcellulose / hypromellose (ISOPTO TEARS / GONIOVISC) 2.5 % ophthalmic solution Place 1 drop into both eyes daily.    Yes [provider]  levothyroxine (SYNTHROID, LEVOTHROID) 75 MCG tablet TAKE 1 TABLET DAILY BEFORE BREAKFAST Patient taking differently: Take 75 mcg by mouth daily before breakfast.  02/28/18  Yes Eustaquio Maize, MD  lovastatin (MEVACOR) 40 MG tablet Take 1 tablet (40 mg total) by mouth at bedtime. 03/07/18  Yes Eustaquio Maize, MD  Melatonin 3 MG TBDP Take 3-6 mg by mouth at bedtime as needed. 08/02/16  Yes Eustaquio Maize, MD  omeprazole (PRILOSEC) 40 MG capsule Take 1 capsule (40 mg total) by mouth daily. 03/07/18  Yes Eustaquio Maize, MD  ondansetron (ZOFRAN) 8 MG tablet Take 1 tablet (8 mg total) by mouth every 8 (eight) hours as needed for nausea or vomiting. 02/07/18  Yes Eustaquio Maize, MD  OXcarbazepine (TRILEPTAL) 150 MG tablet Take 1 tablet (150 mg total) by mouth 2 (two) times daily. 03/19/18  Yes Jaffe, Adam R, DO  oxyCODONE-acetaminophen (PERCOCET) 5-325 MG tablet Take 1 tablet by mouth every 8 (eight) hours as needed for severe pain. 03/12/18  Yes Ronnie Doss M, DO  rivaroxaban (XARELTO) 20 MG TABS tablet Take 1 tablet (20 mg total) by mouth daily with supper. 04/08/18  Yes Roney Jaffe, MD  temozolomide (TEMODAR) 140 MG capsule Take 2 capsules (280mg ) by mouth daily for 5 days on days 10-14 of each 28d cycle. May take on empty stomach at bedtime to decrease N/V 04/23/18  Yes Curt Bears, MD  ciprofloxacin (CIPRO) 500 MG tablet Take 1 tablet (500 mg total) by mouth 2 (two) times daily. 05/12/18   Sharion Balloon, FNP    Family History Family History  Problem Relation Age of Onset  . Heart attack Father 35       fatal  . Coronary artery disease Brother   . Diabetes Brother   . Prostate cancer Brother   . Prostate cancer Son     Social History Social  History   Tobacco Use  . Smoking status: Former Smoker    Packs/day: 0.25  Years: 10.00    Pack years: 2.50    Types: Cigarettes    Last attempt to quit: 07/31/1975    Years since quitting: 42.8  . Smokeless tobacco: Never Used  Substance Use Topics  . Alcohol use: No    Comment: Rare- maybe a drink ever 2 years  . Drug use: No     Allergies   Lisinopril   Review of Systems Review of Systems  Musculoskeletal: Positive for arthralgias.  Neurological: Negative for weakness and numbness.     Physical Exam Updated Vital Signs BP (!) 145/45   Pulse (!) 44   Temp 97.6 F (36.4 C) (Oral)   Resp 18   Ht 5\' 4"  (1.626 m)   Wt 104.8 kg   SpO2 99%   BMI 39.66 kg/m   Physical Exam  Constitutional: She appears well-developed and well-nourished. No distress.  HENT:  Head: Normocephalic and atraumatic.  Right Ear: External ear normal.  Left Ear: External ear normal.  Nose: Nose normal.  Eyes: Right eye exhibits no discharge. Left eye exhibits no discharge.  Cardiovascular: Regular rhythm. Bradycardia present.  Pulses:      Dorsalis pedis pulses are 2+ on the right side, and 2+ on the left side.  Pulmonary/Chest: Effort normal.  Musculoskeletal:       Left knee: No tenderness found.       Left ankle: She exhibits swelling. She exhibits normal range of motion. Tenderness. Achilles tendon exhibits no pain.       Left lower leg: She exhibits tenderness (lateral).       Left foot: There is tenderness and swelling.  Neurological: She is alert.  Skin: Skin is warm and dry. She is not diaphoretic.  Psychiatric: Her mood appears not anxious.  Nursing note and vitals reviewed.    ED Treatments / Results  Labs (all labs ordered are listed, but only abnormal results are displayed) Labs Reviewed - No data to display  EKG None  Radiology Dg Tibia/fibula Left  Result Date: 05/20/2018 CLINICAL DATA:  fall EXAM: LEFT TIBIA AND FIBULA - 2 VIEW COMPARISON:  No recent.  FINDINGS: Prior ORIF distal tibia and fibula. Hardware intact. Anatomic alignment. Diffuse osteopenia and degenerative change. No acute abnormality identified. IMPRESSION: No acute abnormality. Electronically Signed   By: Marcello Moores  Register   On: 05/20/2018 11:06   Dg Ankle Complete Left  Result Date: 05/20/2018 CLINICAL DATA:  Injury after fall. EXAM: LEFT ANKLE COMPLETE - 3+ VIEW COMPARISON:  No recent prior. FINDINGS: Prior ORIF distal fibula and tibia. Hardware intact. Anatomic alignment. Diffuse osteopenia and degenerative change. No acute fracture. IMPRESSION: Prior ORIF distal tibia and fibula. Hardware intact. Anatomic alignment. 2.  Diffuse osteopenia degenerative change.  No acute abnormality. Electronically Signed   By: Marcello Moores  Register   On: 05/20/2018 11:06   Dg Foot Complete Left  Result Date: 05/20/2018 CLINICAL DATA:  Injury after fall. EXAM: LEFT FOOT - COMPLETE 3+ VIEW COMPARISON:  No recent prior. FINDINGS: Prior ORIF distal tibia and fibula. Visualized hardware intact. Diffuse osteopenia. No acute bony or joint abnormality. No evidence of fracture dislocation. IMPRESSION: No acute abnormality. Electronically Signed   By: Marcello Moores  Register   On: 05/20/2018 11:04    Procedures Procedures (including critical care time)  Medications Ordered in ED Medications  acetaminophen (TYLENOL) tablet 650 mg (650 mg Oral Given 05/20/18 1027)     Initial Impression / Assessment and Plan / ED Course  I have reviewed the triage vital signs and the  nursing notes.  Pertinent labs & imaging results that were available during my care of the patient were reviewed by me and considered in my medical decision making (see chart for details).     Patient is neurovascular intact.  While she is bradycardic, this is not a new thing for her, including in the 36s.  Some borderline low sats that then come up without treatment.  However her saturations are above 90.  As far as her ankle injury, there is  some swelling and this is likely sprain/contusion.  X-rays are reassuring.  No hip tenderness on exam.  Placed in an Ace wrap.  Patient is able to stand and bear weight as per typical.  She will be discharged home in care of her husband.  Final Clinical Impressions(s) / ED Diagnoses   Final diagnoses:  Sprain of left ankle, unspecified ligament, initial encounter    ED Discharge Orders    None       Sherwood Gambler, MD 05/20/18 1228

## 2018-05-20 NOTE — Telephone Encounter (Signed)
Per Dr Gwenlyn Found- pt should hold Xarelto starting today 05/20/18 until after cardiac catheterization on 05/22/18. I spoke with pt's husband (DPR), he verbalized understanding, thanked me for call.

## 2018-05-20 NOTE — Telephone Encounter (Signed)
Pt's husband is aware pt has appointment with Dr Gwenlyn Found 05/21/18 1:30 PM [facility].

## 2018-05-20 NOTE — ED Triage Notes (Signed)
Pt was sitting on commode and accidentally slipped off into the floor while bending down to get toilet paper.  c/o pain in left ankle.  EMS reports pt's initial o2 sat was 91-92% so they applied 2liters of o2 via [place].  O2 sat increased to 94%.  Pt's HR was 50's and pt has appt with cardiology today regarding pacemaker insertion scheduled for tomorrow.  bp 115/63.

## 2018-05-21 ENCOUNTER — Ambulatory Visit (INDEPENDENT_AMBULATORY_CARE_PROVIDER_SITE_OTHER): Payer: Medicare Other | Admitting: Cardiovascular Disease

## 2018-05-21 ENCOUNTER — Encounter: Payer: Self-pay | Admitting: Cardiovascular Disease

## 2018-05-21 DIAGNOSIS — I350 Nonrheumatic aortic (valve) stenosis: Secondary | ICD-10-CM | POA: Diagnosis not present

## 2018-05-21 DIAGNOSIS — I480 Paroxysmal atrial fibrillation: Secondary | ICD-10-CM

## 2018-05-21 DIAGNOSIS — E7800 Pure hypercholesterolemia, unspecified: Secondary | ICD-10-CM | POA: Diagnosis not present

## 2018-05-21 DIAGNOSIS — I208 Other forms of angina pectoris: Secondary | ICD-10-CM

## 2018-05-21 DIAGNOSIS — I10 Essential (primary) hypertension: Secondary | ICD-10-CM

## 2018-05-21 NOTE — Assessment & Plan Note (Signed)
History of essential hypertension her blood pressure measured at 144/52.  She is not on antihypertensive medications.

## 2018-05-21 NOTE — Patient Instructions (Signed)
Medication Instructions:  Your physician recommends that you continue on your current medications as directed. Please refer to the Current Medication list given to you today. If you need a refill on your cardiac medications before your next appointment, please call your pharmacy.   Lab work: Your physician recommends that you return for lab work in: TODAY  If you have labs (blood work) drawn today and your tests are completely normal, you will receive your results only by: Marland Kitchen MyChart Message (if you have MyChart) OR . A paper copy in the mail If you have any lab test that is abnormal or we need to change your treatment, we will call you to review the results.  Testing/Procedures: none  Follow-Up: At [facility], you and your health needs are our priority.  As part of our continuing mission to provide you with exceptional heart care, we have created designated Provider Care Teams.  These Care Teams include your primary Cardiologist (physician) and Advanced Practice Providers (APPs -  Physician Assistants and Nurse Practitioners) who all work together to provide you with the care you need, when you need it. You will need a follow up appointment in 4 weeks post Cath on 05/22/2018.  Please call our office 2 months in advance to schedule this appointment.  You may see Quay Burow, MD or one of the following Advanced Practice Providers on your designated Care Team:   Kerin Ransom, PA-C Roby Lofts, [place] . Sande Rives, PA-C  Any Other Special Instructions Will Be Listed Below (If Applicable).

## 2018-05-21 NOTE — H&P (View-Only) (Signed)
05/21/2018 [name]   1934/01/16  [id number]  Primary Physician Eustaquio Maize, MD Primary Cardiologist: Lorretta Harp MD Garret Reddish, [place], [place]  HPI:  [name] is a 84 y.o.  moderately overweight married Caucasian female mother of 109 (son 66 years old died 40/2 years ago prostate CA), grandmother of 2 grandchildren, who was formally a patient of Dr. Delfino Lovett Lowella Fairy. I last saw her in the office  09/20/2017. She has no prior cardiac history. I last saw her in the office 11/10/14. She does have a history of treated hypertension and hyperlipidemia as well as a family history of heart disease. Her father died at age 74 from a heart attack as did her brother. She has another brother who is in open-heart surgery. She has never had a heart attack but has had "mini strokes" in the past. She does get dyspnea on exertion but denies chest pain. I left cell her one year ago. She apparently had Ace sternal fracture secondary to a fall 6 months ago and had a chest CT that showed abnormalities in her lung parenchyma which has been followed by serial CT scans. She was recently admitted to, hospital for chest pain rule out MI. CT angiogram was negative for PE. A Myoview stress test was negative as well. She complains of episodes of weakness as well as palpitations. She does have blood pressure and heart rate readings with some relative bradycardia. Because of palpitations and weakness and performed a 30 day event monitor which showed predominantly sinus rhythm with episodes of sinus bradycardia, pauses up to 2 seconds and a short run of paroxysmal fibrillation with a rapid ventricular response. She is not a good oral anticoagulation patient because of frequent falls and AVMs.  She had a loop recorder implanted by Dr. Rayann Heman which has shown some brief episodes of PAF but no arrhythmias that would be contributing to syncope. She is on Xarelto oral anticoagulation.  Apparently her loop recorder  has shown bradycardia which may be contributory to her symptoms.  Dr. Rayann Heman is planning on removing her loop recorder tomorrow and placing a permanent transvenous pacemaker.  Because of her ongoing chest pain without prior cardiac catheterization it was decided to proceed with outpatient radial diagnostic cath in the morning to define her coronary anatomy prior to pacemaker insertion.  Current Meds  Medication Sig  . ALPRAZolam (XANAX) 0.25 MG tablet Take 1 tablet (0.25 mg total) by mouth 2 (two) times daily as needed for anxiety. (Patient taking differently: Take 0.25 mg by mouth 2 (two) times daily. )  . amitriptyline (ELAVIL) 25 MG tablet Take 1 tablet (25 mg total) by mouth at bedtime.  . capecitabine (XELODA) 500 MG tablet Take 3 Tablets (1500mg ) by mouth twice daily after a meal. Take on days 1-14 of each 28d cycle  . escitalopram (LEXAPRO) 20 MG tablet Take 1 tablet (20 mg total) by mouth at bedtime.  . fluticasone (FLONASE) 50 MCG/ACT nasal spray Place 2 sprays into both nostrils at bedtime.  . furosemide (LASIX) 20 MG tablet TAKE 1 TABLET DAILY AS NEEDED FOR FLUID RETENTION (Patient taking differently: Take 20 mg by mouth as needed for fluid or edema. TAKE 1 TABLET DAILY AS NEEDED FOR FLUID RETENTION)  . gabapentin (NEURONTIN) 400 MG capsule Take 1-2 capsules (400-800 mg total) by mouth 3 (three) times daily. (Patient taking differently: Take 800 mg by mouth 3 (three) times daily. )  . hydroxypropyl methylcellulose / hypromellose (ISOPTO TEARS /  GONIOVISC) 2.5 % ophthalmic solution Place 1 drop into both eyes daily.   Marland Kitchen levothyroxine (SYNTHROID, LEVOTHROID) 75 MCG tablet TAKE 1 TABLET DAILY BEFORE BREAKFAST (Patient taking differently: Take 75 mcg by mouth daily before breakfast. )  . lovastatin (MEVACOR) 40 MG tablet Take 1 tablet (40 mg total) by mouth at bedtime.  . Melatonin 3 MG TBDP Take 3-6 mg by mouth at bedtime as needed.  Marland Kitchen omeprazole (PRILOSEC) 40 MG capsule Take 1 capsule (40 mg  total) by mouth daily.  . ondansetron (ZOFRAN) 8 MG tablet Take 1 tablet (8 mg total) by mouth every 8 (eight) hours as needed for nausea or vomiting.  . OXcarbazepine (TRILEPTAL) 150 MG tablet Take 1 tablet (150 mg total) by mouth 2 (two) times daily.  Marland Kitchen oxyCODONE-acetaminophen (PERCOCET) 5-325 MG tablet Take 1 tablet by mouth every 8 (eight) hours as needed for severe pain.     Allergies  Allergen Reactions  . Lisinopril Cough    Social History   Socioeconomic History  . Marital status: Married    Spouse name: Not on file  . Number of children: 6  . Years of education: Not on file  . Highest education level: 10th grade  Occupational History  . Occupation: Agricultural engineer  Social Needs  . Financial resource strain: Not hard at all  . Food insecurity:    Worry: Never true    Inability: Never true  . Transportation needs:    Medical: No    Non-medical: No  Tobacco Use  . Smoking status: Former Smoker    Packs/day: 0.25    Years: 10.00    Pack years: 2.50    Types: Cigarettes    Last attempt to quit: 07/31/1975    Years since quitting: 42.8  . Smokeless tobacco: Never Used  Substance and Sexual Activity  . Alcohol use: No    Comment: Rare- maybe a drink ever 2 years  . Drug use: No  . Sexual activity: Not Currently  Lifestyle  . Physical activity:    Days per week: 0 days    Minutes per session: 0 min  . Stress: Not at all  Relationships  . Social connections:    Talks on phone: More than three times a week    Gets together: More than three times a week    Attends religious service: Never    Active member of club or organization: No    Attends meetings of clubs or organizations: Never    Relationship status: Married  . Intimate partner violence:    Fear of current or ex partner: No    Emotionally abused: No    Physically abused: No    Forced sexual activity: No  Other Topics Concern  . Not on file  Social History Narrative   Lives with husband, does have stairs,  does not use them. Pt completed 10th grade.     Review of Systems: General: negative for chills, fever, night sweats or weight changes.  Cardiovascular: negative for chest pain, dyspnea on exertion, edema, orthopnea, palpitations, paroxysmal nocturnal dyspnea or shortness of breath Dermatological: negative for rash Respiratory: negative for cough or wheezing Urologic: negative for hematuria Abdominal: negative for nausea, vomiting, diarrhea, bright red blood per rectum, melena, or hematemesis Neurologic: negative for visual changes, syncope, or dizziness All other systems reviewed and are otherwise negative except as noted above.    Blood pressure (!) 144/52, pulse (!) 51, height 5\' 4"  (1.626 m).  General appearance: alert and no distress  Neck: no adenopathy, no JVD, supple, symmetrical, trachea midline, thyroid not enlarged, symmetric, no tenderness/mass/nodules and Bilateral carotid bruits left louder than right Lungs: clear to auscultation bilaterally Heart: Soft outflow tract murmur consistent with aortic stenosis Extremities: extremities normal, atraumatic, no cyanosis or edema Pulses: 2+ and symmetric Skin: Skin color, texture, turgor normal. No rashes or lesions Neurologic: Alert and oriented X 3, normal strength and tone. Normal symmetric reflexes. Normal coordination and gait  EKG not performed today  ASSESSMENT AND PLAN:   Hyperlipemia History of hyperlipidemia on statin therapy  Essential hypertension History of essential hypertension her blood pressure measured at 144/52.  She is not on antihypertensive medications.  Paroxysmal atrial fibrillation ([name]) History of paroxysmal A. fib on Xarelto oral anticoagulation which was held last night in anticipation radial heart cath tomorrow.  Chest pain History of fairly frequent chest pain with positive risk factors.  We have arranged for her to undergo radial diagnostic cath tomorrow morning prior to anticipated pacemaker  implantation tomorrow afternoon by Dr. Rayann Heman.  Mild aortic stenosis 2D echocardiogram performed 04/29/2018 revealed normal LV systolic function with mild aortic stenosis and a peak gradient of 25 mmHg.      Lorretta Harp MD FACP,FACC,FAHA, [facility] 05/21/2018 1:50 PM

## 2018-05-21 NOTE — Assessment & Plan Note (Signed)
History of paroxysmal A. fib on Xarelto oral anticoagulation which was held last night in anticipation radial heart cath tomorrow.

## 2018-05-21 NOTE — Assessment & Plan Note (Addendum)
History of hyperlipidemia on statin therapy.  Recent lipid profile performed 07/15/2016 revealed total cholesterol 124, LDL 61 and HDL 50.

## 2018-05-21 NOTE — Progress Notes (Signed)
05/21/2018 [name]   October 31, 1933  [id number]  Primary Physician Eustaquio Maize, MD Primary Cardiologist: Lorretta Harp MD Garret Reddish, [place], [place]  HPI:  [name] is a 84 y.o.  moderately overweight married Caucasian female mother of 21 (son 45 years old died 51/2 years ago prostate CA), grandmother of 2 grandchildren, who was formally a patient of Dr. Delfino Lovett Lowella Fairy. I last saw her in the office  09/20/2017. She has no prior cardiac history. I last saw her in the office 11/10/14. She does have a history of treated hypertension and hyperlipidemia as well as a family history of heart disease. Her father died at age 75 from a heart attack as did her brother. She has another brother who is in open-heart surgery. She has never had a heart attack but has had "mini strokes" in the past. She does get dyspnea on exertion but denies chest pain. I left cell her one year ago. She apparently had Ace sternal fracture secondary to a fall 6 months ago and had a chest CT that showed abnormalities in her lung parenchyma which has been followed by serial CT scans. She was recently admitted to, hospital for chest pain rule out MI. CT angiogram was negative for PE. A Myoview stress test was negative as well. She complains of episodes of weakness as well as palpitations. She does have blood pressure and heart rate readings with some relative bradycardia. Because of palpitations and weakness and performed a 30 day event monitor which showed predominantly sinus rhythm with episodes of sinus bradycardia, pauses up to 2 seconds and a short run of paroxysmal fibrillation with a rapid ventricular response. She is not a good oral anticoagulation patient because of frequent falls and AVMs.  She had a loop recorder implanted by Dr. Rayann Heman which has shown some brief episodes of PAF but no arrhythmias that would be contributing to syncope. She is on Xarelto oral anticoagulation.  Apparently her loop recorder  has shown bradycardia which may be contributory to her symptoms.  Dr. Rayann Heman is planning on removing her loop recorder tomorrow and placing a permanent transvenous pacemaker.  Because of her ongoing chest pain without prior cardiac catheterization it was decided to proceed with outpatient radial diagnostic cath in the morning to define her coronary anatomy prior to pacemaker insertion.  Current Meds  Medication Sig  . ALPRAZolam (XANAX) 0.25 MG tablet Take 1 tablet (0.25 mg total) by mouth 2 (two) times daily as needed for anxiety. (Patient taking differently: Take 0.25 mg by mouth 2 (two) times daily. )  . amitriptyline (ELAVIL) 25 MG tablet Take 1 tablet (25 mg total) by mouth at bedtime.  . capecitabine (XELODA) 500 MG tablet Take 3 Tablets (1500mg ) by mouth twice daily after a meal. Take on days 1-14 of each 28d cycle  . escitalopram (LEXAPRO) 20 MG tablet Take 1 tablet (20 mg total) by mouth at bedtime.  . fluticasone (FLONASE) 50 MCG/ACT nasal spray Place 2 sprays into both nostrils at bedtime.  . furosemide (LASIX) 20 MG tablet TAKE 1 TABLET DAILY AS NEEDED FOR FLUID RETENTION (Patient taking differently: Take 20 mg by mouth as needed for fluid or edema. TAKE 1 TABLET DAILY AS NEEDED FOR FLUID RETENTION)  . gabapentin (NEURONTIN) 400 MG capsule Take 1-2 capsules (400-800 mg total) by mouth 3 (three) times daily. (Patient taking differently: Take 800 mg by mouth 3 (three) times daily. )  . hydroxypropyl methylcellulose / hypromellose (ISOPTO TEARS /  GONIOVISC) 2.5 % ophthalmic solution Place 1 drop into both eyes daily.   Marland Kitchen levothyroxine (SYNTHROID, LEVOTHROID) 75 MCG tablet TAKE 1 TABLET DAILY BEFORE BREAKFAST (Patient taking differently: Take 75 mcg by mouth daily before breakfast. )  . lovastatin (MEVACOR) 40 MG tablet Take 1 tablet (40 mg total) by mouth at bedtime.  . Melatonin 3 MG TBDP Take 3-6 mg by mouth at bedtime as needed.  Marland Kitchen omeprazole (PRILOSEC) 40 MG capsule Take 1 capsule (40 mg  total) by mouth daily.  . ondansetron (ZOFRAN) 8 MG tablet Take 1 tablet (8 mg total) by mouth every 8 (eight) hours as needed for nausea or vomiting.  . OXcarbazepine (TRILEPTAL) 150 MG tablet Take 1 tablet (150 mg total) by mouth 2 (two) times daily.  Marland Kitchen oxyCODONE-acetaminophen (PERCOCET) 5-325 MG tablet Take 1 tablet by mouth every 8 (eight) hours as needed for severe pain.     Allergies  Allergen Reactions  . Lisinopril Cough    Social History   Socioeconomic History  . Marital status: Married    Spouse name: Not on file  . Number of children: 6  . Years of education: Not on file  . Highest education level: 10th grade  Occupational History  . Occupation: Agricultural engineer  Social Needs  . Financial resource strain: Not hard at all  . Food insecurity:    Worry: Never true    Inability: Never true  . Transportation needs:    Medical: No    Non-medical: No  Tobacco Use  . Smoking status: Former Smoker    Packs/day: 0.25    Years: 10.00    Pack years: 2.50    Types: Cigarettes    Last attempt to quit: 07/31/1975    Years since quitting: 42.8  . Smokeless tobacco: Never Used  Substance and Sexual Activity  . Alcohol use: No    Comment: Rare- maybe a drink ever 2 years  . Drug use: No  . Sexual activity: Not Currently  Lifestyle  . Physical activity:    Days per week: 0 days    Minutes per session: 0 min  . Stress: Not at all  Relationships  . Social connections:    Talks on phone: More than three times a week    Gets together: More than three times a week    Attends religious service: Never    Active member of club or organization: No    Attends meetings of clubs or organizations: Never    Relationship status: Married  . Intimate partner violence:    Fear of current or ex partner: No    Emotionally abused: No    Physically abused: No    Forced sexual activity: No  Other Topics Concern  . Not on file  Social History Narrative   Lives with husband, does have stairs,  does not use them. Pt completed 10th grade.     Review of Systems: General: negative for chills, fever, night sweats or weight changes.  Cardiovascular: negative for chest pain, dyspnea on exertion, edema, orthopnea, palpitations, paroxysmal nocturnal dyspnea or shortness of breath Dermatological: negative for rash Respiratory: negative for cough or wheezing Urologic: negative for hematuria Abdominal: negative for nausea, vomiting, diarrhea, bright red blood per rectum, melena, or hematemesis Neurologic: negative for visual changes, syncope, or dizziness All other systems reviewed and are otherwise negative except as noted above.    Blood pressure (!) 144/52, pulse (!) 51, height 5\' 4"  (1.626 m).  General appearance: alert and no distress  Neck: no adenopathy, no JVD, supple, symmetrical, trachea midline, thyroid not enlarged, symmetric, no tenderness/mass/nodules and Bilateral carotid bruits left louder than right Lungs: clear to auscultation bilaterally Heart: Soft outflow tract murmur consistent with aortic stenosis Extremities: extremities normal, atraumatic, no cyanosis or edema Pulses: 2+ and symmetric Skin: Skin color, texture, turgor normal. No rashes or lesions Neurologic: Alert and oriented X 3, normal strength and tone. Normal symmetric reflexes. Normal coordination and gait  EKG not performed today  ASSESSMENT AND PLAN:   Hyperlipemia History of hyperlipidemia on statin therapy  Essential hypertension History of essential hypertension her blood pressure measured at 144/52.  She is not on antihypertensive medications.  Paroxysmal atrial fibrillation ([name]) History of paroxysmal A. fib on Xarelto oral anticoagulation which was held last night in anticipation radial heart cath tomorrow.  Chest pain History of fairly frequent chest pain with positive risk factors.  We have arranged for her to undergo radial diagnostic cath tomorrow morning prior to anticipated pacemaker  implantation tomorrow afternoon by Dr. Rayann Heman.  Mild aortic stenosis 2D echocardiogram performed 04/29/2018 revealed normal LV systolic function with mild aortic stenosis and a peak gradient of 25 mmHg.      Lorretta Harp MD FACP,FACC,FAHA, [facility] 05/21/2018 1:50 PM

## 2018-05-21 NOTE — Assessment & Plan Note (Signed)
2D echocardiogram performed 04/29/2018 revealed normal LV systolic function with mild aortic stenosis and a peak gradient of 25 mmHg.

## 2018-05-21 NOTE — Assessment & Plan Note (Signed)
History of fairly frequent chest pain with positive risk factors.  We have arranged for her to undergo radial diagnostic cath tomorrow morning prior to anticipated pacemaker implantation tomorrow afternoon by Dr. Rayann Heman.

## 2018-05-22 ENCOUNTER — Encounter (HOSPITAL_COMMUNITY)
Admission: RE | Disposition: A | Discharge status: Home Health | Payer: Self-pay | Source: Ambulatory Visit | Attending: Internal Medicine

## 2018-05-22 ENCOUNTER — Encounter: Payer: Self-pay | Admitting: Cardiovascular Disease

## 2018-05-22 ENCOUNTER — Ambulatory Visit (HOSPITAL_COMMUNITY)
Admission: RE | Disposition: A | Discharge status: Home Health | Payer: Self-pay | Source: Ambulatory Visit | Attending: Internal Medicine

## 2018-05-22 ENCOUNTER — Other Ambulatory Visit: Payer: Self-pay

## 2018-05-22 ENCOUNTER — Ambulatory Visit (HOSPITAL_COMMUNITY)
Admission: RE | Admit: 2018-05-22 | Discharge: 2018-05-23 | Disposition: A | Discharge status: Home Health | Payer: Medicare Other | Source: Ambulatory Visit | Attending: Internal Medicine | Admitting: Internal Medicine

## 2018-05-22 DIAGNOSIS — I10 Essential (primary) hypertension: Secondary | ICD-10-CM | POA: Insufficient documentation

## 2018-05-22 DIAGNOSIS — E7800 Pure hypercholesterolemia, unspecified: Secondary | ICD-10-CM | POA: Insufficient documentation

## 2018-05-22 DIAGNOSIS — E785 Hyperlipidemia, unspecified: Secondary | ICD-10-CM | POA: Diagnosis not present

## 2018-05-22 DIAGNOSIS — Z8673 Personal history of transient ischemic attack (TIA), and cerebral infarction without residual deficits: Secondary | ICD-10-CM | POA: Insufficient documentation

## 2018-05-22 DIAGNOSIS — Z8249 Family history of ischemic heart disease and other diseases of the circulatory system: Secondary | ICD-10-CM | POA: Insufficient documentation

## 2018-05-22 DIAGNOSIS — Z9889 Other specified postprocedural states: Secondary | ICD-10-CM | POA: Insufficient documentation

## 2018-05-22 DIAGNOSIS — Z888 Allergy status to other drugs, medicaments and biological substances status: Secondary | ICD-10-CM | POA: Insufficient documentation

## 2018-05-22 DIAGNOSIS — R079 Chest pain, unspecified: Secondary | ICD-10-CM | POA: Diagnosis present

## 2018-05-22 DIAGNOSIS — K589 Irritable bowel syndrome without diarrhea: Secondary | ICD-10-CM | POA: Insufficient documentation

## 2018-05-22 DIAGNOSIS — C3490 Malignant neoplasm of unspecified part of unspecified bronchus or lung: Secondary | ICD-10-CM | POA: Diagnosis not present

## 2018-05-22 DIAGNOSIS — F419 Anxiety disorder, unspecified: Secondary | ICD-10-CM | POA: Diagnosis not present

## 2018-05-22 DIAGNOSIS — G40909 Epilepsy, unspecified, not intractable, without status epilepticus: Secondary | ICD-10-CM | POA: Diagnosis not present

## 2018-05-22 DIAGNOSIS — I25118 Atherosclerotic heart disease of native coronary artery with other forms of angina pectoris: Secondary | ICD-10-CM | POA: Insufficient documentation

## 2018-05-22 DIAGNOSIS — H269 Unspecified cataract: Secondary | ICD-10-CM | POA: Insufficient documentation

## 2018-05-22 DIAGNOSIS — Z87440 Personal history of urinary (tract) infections: Secondary | ICD-10-CM | POA: Insufficient documentation

## 2018-05-22 DIAGNOSIS — M1990 Unspecified osteoarthritis, unspecified site: Secondary | ICD-10-CM | POA: Insufficient documentation

## 2018-05-22 DIAGNOSIS — Z7951 Long term (current) use of inhaled steroids: Secondary | ICD-10-CM | POA: Diagnosis not present

## 2018-05-22 DIAGNOSIS — Z79890 Hormone replacement therapy (postmenopausal): Secondary | ICD-10-CM | POA: Diagnosis not present

## 2018-05-22 DIAGNOSIS — E669 Obesity, unspecified: Secondary | ICD-10-CM | POA: Diagnosis not present

## 2018-05-22 DIAGNOSIS — E039 Hypothyroidism, unspecified: Secondary | ICD-10-CM | POA: Insufficient documentation

## 2018-05-22 DIAGNOSIS — F329 Major depressive disorder, single episode, unspecified: Secondary | ICD-10-CM | POA: Diagnosis not present

## 2018-05-22 DIAGNOSIS — Z87891 Personal history of nicotine dependence: Secondary | ICD-10-CM | POA: Diagnosis not present

## 2018-05-22 DIAGNOSIS — I480 Paroxysmal atrial fibrillation: Secondary | ICD-10-CM | POA: Insufficient documentation

## 2018-05-22 DIAGNOSIS — Z7901 Long term (current) use of anticoagulants: Secondary | ICD-10-CM | POA: Insufficient documentation

## 2018-05-22 DIAGNOSIS — K219 Gastro-esophageal reflux disease without esophagitis: Secondary | ICD-10-CM | POA: Insufficient documentation

## 2018-05-22 DIAGNOSIS — I495 Sick sinus syndrome: Secondary | ICD-10-CM | POA: Diagnosis present

## 2018-05-22 DIAGNOSIS — Z79899 Other long term (current) drug therapy: Secondary | ICD-10-CM | POA: Insufficient documentation

## 2018-05-22 DIAGNOSIS — Z90710 Acquired absence of both cervix and uterus: Secondary | ICD-10-CM | POA: Insufficient documentation

## 2018-05-22 DIAGNOSIS — Z959 Presence of cardiac and vascular implant and graft, unspecified: Secondary | ICD-10-CM

## 2018-05-22 HISTORY — DX: Sick sinus syndrome: I49.5

## 2018-05-22 HISTORY — PX: CORONARY ANGIOGRAPHY: CATH118303

## 2018-05-22 HISTORY — PX: PACEMAKER IMPLANT: EP1218

## 2018-05-22 HISTORY — PX: LOOP RECORDER REMOVAL: EP1215

## 2018-05-22 LAB — SURGICAL PCR SCREEN
MRSA, PCR: NEGATIVE
Staphylococcus aureus: NEGATIVE

## 2018-05-22 SURGERY — PACEMAKER IMPLANT

## 2018-05-22 SURGERY — CORONARY ANGIOGRAPHY (CATH LAB)
Anesthesia: LOCAL

## 2018-05-22 MED ORDER — ASPIRIN 81 MG PO CHEW
81.0000 mg | CHEWABLE_TABLET | Freq: Once | ORAL | Status: AC
  Administered 2018-05-22: 81 mg via ORAL
  Filled 2018-05-22: qty 1

## 2018-05-22 MED ORDER — ACETAMINOPHEN 325 MG PO TABS: 325.0000 mg | ORAL_TABLET | ORAL | Status: DC | PRN

## 2018-05-22 MED ORDER — VERAPAMIL HCL 2.5 MG/ML IV SOLN
INTRAVENOUS | Status: DC | PRN
  Administered 2018-05-22: 10 mL via INTRA_ARTERIAL

## 2018-05-22 MED ORDER — LIDOCAINE HCL (PF) 1 % IJ SOLN
INTRAMUSCULAR | Status: AC
  Filled 2018-05-22: qty 30

## 2018-05-22 MED ORDER — GABAPENTIN 400 MG PO CAPS
800.0000 mg | ORAL_CAPSULE | Freq: Three times a day (TID) | ORAL | Status: DC
  Administered 2018-05-22 – 2018-05-23 (×3): 800 mg via ORAL
  Filled 2018-05-22 (×3): qty 2

## 2018-05-22 MED ORDER — HYDROCODONE-ACETAMINOPHEN 5-325 MG PO TABS
1.0000 | ORAL_TABLET | ORAL | Status: DC | PRN
  Administered 2018-05-22: 1 via ORAL
  Filled 2018-05-22: qty 1

## 2018-05-22 MED ORDER — SODIUM CHLORIDE 0.9 % WEIGHT BASED INFUSION
3.0000 mL/kg/h | INTRAVENOUS | Status: DC
  Administered 2018-05-22: 3 mL/kg/h via INTRAVENOUS

## 2018-05-22 MED ORDER — PANTOPRAZOLE SODIUM 40 MG PO TBEC
40.0000 mg | DELAYED_RELEASE_TABLET | Freq: Every day | ORAL | Status: DC
  Administered 2018-05-23: 40 mg via ORAL
  Filled 2018-05-22: qty 1

## 2018-05-22 MED ORDER — MIDAZOLAM HCL 5 MG/5ML IJ SOLN
INTRAMUSCULAR | Status: AC
  Filled 2018-05-22: qty 5

## 2018-05-22 MED ORDER — SODIUM CHLORIDE 0.9 % IV SOLN
80.0000 mg | INTRAVENOUS | Status: DC
  Administered 2018-05-22: 80 mg
  Filled 2018-05-22: qty 2

## 2018-05-22 MED ORDER — SODIUM CHLORIDE 0.9 % IV SOLN: 250.0000 mL | INTRAVENOUS | Status: DC | PRN

## 2018-05-22 MED ORDER — AMLODIPINE BESYLATE 5 MG PO TABS
5.0000 mg | ORAL_TABLET | Freq: Every day | ORAL | Status: DC
  Administered 2018-05-22 – 2018-05-23 (×2): 5 mg via ORAL
  Filled 2018-05-22 (×2): qty 1

## 2018-05-22 MED ORDER — AMITRIPTYLINE HCL 25 MG PO TABS
25.0000 mg | ORAL_TABLET | Freq: Every day | ORAL | Status: DC
  Administered 2018-05-22: 20:00:00 25 mg via ORAL
  Filled 2018-05-22: qty 1

## 2018-05-22 MED ORDER — MUPIROCIN 2 % EX OINT
1.0000 "application " | TOPICAL_OINTMENT | Freq: Once | CUTANEOUS | Status: AC
  Administered 2018-05-22: 1 via TOPICAL
  Filled 2018-05-22: qty 22

## 2018-05-22 MED ORDER — LIDOCAINE HCL (PF) 1 % IJ SOLN
INTRAMUSCULAR | Status: DC | PRN
  Administered 2018-05-22: 45 mL

## 2018-05-22 MED ORDER — LIDOCAINE HCL (PF) 1 % IJ SOLN
INTRAMUSCULAR | Status: DC | PRN
  Administered 2018-05-22: 2 mL

## 2018-05-22 MED ORDER — LEVOTHYROXINE SODIUM 75 MCG PO TABS
75.0000 ug | ORAL_TABLET | Freq: Every day | ORAL | Status: DC
  Administered 2018-05-23: 75 ug via ORAL
  Filled 2018-05-22: qty 1

## 2018-05-22 MED ORDER — YOU HAVE A PACEMAKER BOOK
Freq: Once | Status: AC
  Administered 2018-05-22: 22:00:00 1
  Filled 2018-05-22: qty 1

## 2018-05-22 MED ORDER — HEPARIN (PORCINE) IN NACL 1000-0.9 UT/500ML-% IV SOLN
INTRAVENOUS | Status: AC
  Filled 2018-05-22: qty 500

## 2018-05-22 MED ORDER — HEPARIN SODIUM (PORCINE) 1000 UNIT/ML IJ SOLN
INTRAMUSCULAR | Status: AC
  Filled 2018-05-22: qty 1

## 2018-05-22 MED ORDER — SODIUM CHLORIDE 0.9 % IV SOLN
INTRAVENOUS | Status: AC
  Administered 2018-05-22: 15:00:00 via INTRAVENOUS

## 2018-05-22 MED ORDER — SODIUM CHLORIDE 0.9 % IV SOLN: INTRAVENOUS | Status: DC

## 2018-05-22 MED ORDER — ONDANSETRON HCL 4 MG/2ML IJ SOLN: 4.0000 mg | Freq: Four times a day (QID) | INTRAMUSCULAR | Status: DC | PRN

## 2018-05-22 MED ORDER — ESCITALOPRAM OXALATE 20 MG PO TABS
20.0000 mg | ORAL_TABLET | Freq: Every day | ORAL | Status: DC
  Administered 2018-05-22: 20 mg via ORAL
  Filled 2018-05-22 (×2): qty 1

## 2018-05-22 MED ORDER — CEFAZOLIN SODIUM-DEXTROSE 2-4 GM/100ML-% IV SOLN
INTRAVENOUS | Status: AC
  Filled 2018-05-22: qty 100

## 2018-05-22 MED ORDER — CHLORHEXIDINE GLUCONATE 4 % EX LIQD
60.0000 mL | Freq: Once | CUTANEOUS | Status: DC
  Filled 2018-05-22: qty 60

## 2018-05-22 MED ORDER — HEPARIN (PORCINE) IN NACL 1000-0.9 UT/500ML-% IV SOLN
INTRAVENOUS | Status: DC | PRN
  Administered 2018-05-22: 500 mL

## 2018-05-22 MED ORDER — CEFAZOLIN SODIUM-DEXTROSE 2-4 GM/100ML-% IV SOLN
2.0000 g | INTRAVENOUS | Status: DC
  Administered 2018-05-22: 2 g via INTRAVENOUS
  Filled 2018-05-22: qty 100

## 2018-05-22 MED ORDER — IOHEXOL 350 MG/ML SOLN
INTRAVENOUS | Status: DC | PRN
  Administered 2018-05-22: 40 mL via INTRA_ARTERIAL

## 2018-05-22 MED ORDER — MORPHINE SULFATE (PF) 10 MG/ML IV SOLN: 2.0000 mg | INTRAVENOUS | Status: DC | PRN

## 2018-05-22 MED ORDER — ASPIRIN 81 MG PO CHEW
81.0000 mg | CHEWABLE_TABLET | Freq: Every day | ORAL | Status: DC
  Administered 2018-05-23: 81 mg via ORAL
  Filled 2018-05-22: qty 1

## 2018-05-22 MED ORDER — VERAPAMIL HCL 2.5 MG/ML IV SOLN
INTRAVENOUS | Status: AC
  Filled 2018-05-22: qty 2

## 2018-05-22 MED ORDER — LIDOCAINE HCL 1 % IJ SOLN
INTRAMUSCULAR | Status: AC
  Filled 2018-05-22: qty 60

## 2018-05-22 MED ORDER — SODIUM CHLORIDE 0.9 % IV SOLN
INTRAVENOUS | Status: AC
  Filled 2018-05-22: qty 2

## 2018-05-22 MED ORDER — SODIUM CHLORIDE 0.9% FLUSH
3.0000 mL | Freq: Two times a day (BID) | INTRAVENOUS | Status: DC
  Administered 2018-05-22 – 2018-05-23 (×2): 3 mL via INTRAVENOUS

## 2018-05-22 MED ORDER — HEPARIN (PORCINE) IN NACL 1000-0.9 UT/500ML-% IV SOLN
INTRAVENOUS | Status: AC
  Filled 2018-05-22: qty 1000

## 2018-05-22 MED ORDER — HYDRALAZINE HCL 20 MG/ML IJ SOLN: 5.0000 mg | Freq: Four times a day (QID) | INTRAMUSCULAR | Status: DC | PRN

## 2018-05-22 MED ORDER — SODIUM CHLORIDE 0.9% FLUSH: 3.0000 mL | Freq: Two times a day (BID) | INTRAVENOUS | Status: DC

## 2018-05-22 MED ORDER — ACETAMINOPHEN 325 MG PO TABS
650.0000 mg | ORAL_TABLET | ORAL | Status: DC | PRN
  Administered 2018-05-23: 06:00:00 650 mg via ORAL
  Filled 2018-05-22: qty 2

## 2018-05-22 MED ORDER — ALPRAZOLAM 0.25 MG PO TABS: 0.2500 mg | ORAL_TABLET | Freq: Two times a day (BID) | ORAL | Status: DC | PRN

## 2018-05-22 MED ORDER — HEPARIN SODIUM (PORCINE) 1000 UNIT/ML IJ SOLN
INTRAMUSCULAR | Status: DC | PRN
  Administered 2018-05-22: 5000 [IU] via INTRAVENOUS

## 2018-05-22 MED ORDER — MUPIROCIN 2 % EX OINT
TOPICAL_OINTMENT | CUTANEOUS | Status: AC
  Administered 2018-05-22: 1 via TOPICAL
  Filled 2018-05-22: qty 22

## 2018-05-22 MED ORDER — FLUTICASONE PROPIONATE 50 MCG/ACT NA SUSP
2.0000 | Freq: Every day | NASAL | Status: DC
  Administered 2018-05-22: 22:00:00 2 via NASAL
  Filled 2018-05-22 (×2): qty 16

## 2018-05-22 MED ORDER — CEFAZOLIN SODIUM-DEXTROSE 1-4 GM/50ML-% IV SOLN
1.0000 g | Freq: Four times a day (QID) | INTRAVENOUS | Status: AC
  Administered 2018-05-22 – 2018-05-23 (×3): 1 g via INTRAVENOUS
  Filled 2018-05-22 (×3): qty 50

## 2018-05-22 MED ORDER — SODIUM CHLORIDE 0.9% FLUSH: 3.0000 mL | INTRAVENOUS | Status: DC | PRN

## 2018-05-22 MED ORDER — IOPAMIDOL (ISOVUE-370) INJECTION 76%
INTRAVENOUS | Status: AC
  Filled 2018-05-22: qty 50

## 2018-05-22 MED ORDER — FENTANYL CITRATE (PF) 100 MCG/2ML IJ SOLN
INTRAMUSCULAR | Status: AC
  Filled 2018-05-22: qty 2

## 2018-05-22 MED ORDER — SODIUM CHLORIDE 0.9 % WEIGHT BASED INFUSION: 1.0000 mL/kg/h | INTRAVENOUS | Status: DC

## 2018-05-22 MED ORDER — HEPARIN (PORCINE) IN NACL 1000-0.9 UT/500ML-% IV SOLN
INTRAVENOUS | Status: DC | PRN
  Administered 2018-05-22 (×2): 500 mL

## 2018-05-22 SURGICAL SUPPLY — 8 items
CABLE SURGICAL S-101-97-12 (CABLE) ×3 IMPLANT
IPG PACE AZUR XT DR MRI W1DR01 (Pacemaker) ×1 IMPLANT
LEAD CAPSURE NOVUS 45CM (Lead) ×3 IMPLANT
LEAD CAPSURE NOVUS 5076-58CM (Lead) ×3 IMPLANT
PACE AZURE XT DR MRI W1DR01 (Pacemaker) ×3 IMPLANT
PAD PRO RADIOLUCENT 2001M-C (PAD) ×3 IMPLANT
SHEATH CLASSIC 7F (SHEATH) ×6 IMPLANT
TRAY PACEMAKER INSERTION (PACKS) ×3 IMPLANT

## 2018-05-22 SURGICAL SUPPLY — 13 items
CATH INFINITI 5FR ANG PIGTAIL (CATHETERS) ×2 IMPLANT
CATH INFINITI JR4 5F (CATHETERS) ×2 IMPLANT
CATH OPTITORQUE TIG 4.0 5F (CATHETERS) ×2 IMPLANT
DEVICE RAD COMP TR BAND LRG (VASCULAR PRODUCTS) ×2 IMPLANT
GLIDESHEATH SLEND A-KIT 6F 22G (SHEATH) ×2 IMPLANT
GUIDEWIRE INQWIRE 1.5J.035X260 (WIRE) ×1 IMPLANT
INQWIRE 1.5J .035X260CM (WIRE) ×2
KIT HEART LEFT (KITS) ×2 IMPLANT
PACK CARDIAC CATHETERIZATION (CUSTOM PROCEDURE TRAY) ×2 IMPLANT
SYR MEDRAD MARK V 150ML (SYRINGE) ×2 IMPLANT
TRANSDUCER W/STOPCOCK (MISCELLANEOUS) ×2 IMPLANT
TUBING CIL FLEX 10 FLL-RA (TUBING) ×2 IMPLANT
WIRE HI TORQ VERSACORE-J 145CM (WIRE) ×2 IMPLANT

## 2018-05-22 NOTE — Interval H&P Note (Signed)
History and Physical Interval Note:  05/22/2018 11:22 AM  [name]  has presented today for surgery, with the diagnosis of bradycardia  The various methods of treatment have been discussed with the patient and family. After consideration of risks, benefits and other options for treatment, the patient has consented to  Procedure(s): PACEMAKER IMPLANT (N/A) LOOP RECORDER REMOVAL (N/A) as a surgical intervention .  The patient's history has been reviewed, patient examined, no change in status, stable for surgery.  I have reviewed the patient's chart and labs.  Questions were answered to the patient's satisfaction.     The patient has symptomatic bradycardia. Cath reveals no CAD.   I would therefore recommend pacemaker implantation at this time.  Risks, benefits, alternatives to pacemaker implantation with loop recorder removal were discussed in detail with the patient today. The patient understands that the risks include but are not limited to bleeding, infection, pneumothorax, perforation, tamponade, vascular damage, renal failure, MI, stroke, death,  and lead dislodgement and wishes to proceed.   Thompson Grayer MD, [facility] 05/22/2018 11:22 AM

## 2018-05-22 NOTE — Interval H&P Note (Signed)
Cath Lab Visit (complete for each Cath Lab visit)  Clinical Evaluation Leading to the Procedure:   ACS: No.  Non-ACS:    Anginal Classification: CCS II  Anti-ischemic medical therapy: No Therapy  Non-Invasive Test Results: No non-invasive testing performed  Prior CABG: No previous CABG      History and Physical Interval Note:  05/22/2018 8:23 AM  [name]  has presented today for surgery, with the diagnosis of cp  The various methods of treatment have been discussed with the patient and family. After consideration of risks, benefits and other options for treatment, the patient has consented to  Procedure(s): LEFT HEART CATH AND CORONARY ANGIOGRAPHY (N/A) as a surgical intervention .  The patient's history has been reviewed, patient examined, no change in status, stable for surgery.  I have reviewed the patient's chart and labs.  Questions were answered to the patient's satisfaction.     Quay Burow

## 2018-05-22 NOTE — Progress Notes (Signed)
   Notified by RN that patient has been hypertensive since arrival to the floor following PPM implantation. She has a history of HTN but has not been on medications. BP also noted to be elevated a recent outpatient vitis.  Will start amlodipine 5mg  daily for HTN.   Abigail Butts, PA-C 05/22/18

## 2018-05-22 NOTE — Progress Notes (Signed)
Several small open areas noted to left hip area. Bruise noted around site. Area cleaned and Tegaderm applied. Pt husband states she did that when she fell a few days ago.

## 2018-05-22 NOTE — Discharge Instructions (Signed)
Cath site (wrist) care instructions No lifting over 5 lbs for 1 week. No vigorous activity for 1 week. Keep procedure site clean & dry. If you notice increased pain, swelling, bleeding or pus, call/return!  You may shower, but no soaking baths/hot tubs/pools for 1 week.         Supplemental Discharge Instructions for  Pacemaker/Defibrillator Patients  Activity No heavy lifting or vigorous activity with your left/right arm for 6 to 8 weeks.  Do not raise your left/right arm above your head for one week.  Gradually raise your affected arm as drawn below.             05/26/18                   05/27/18                  05/28/18                  05/29/18 __  NO DRIVING for 1 week  ; you may begin driving on  [address]  .  WOUND CARE - Keep the wound area clean and dry.  Do not get this area wet, no showers until after your wound check visit . - The tape/steri-strips on your wound will fall off; do not pull them off.  No bandage is needed on the site.  DO  NOT apply any creams, oils, or ointments to the wound area. - If you notice any drainage or discharge from the wound, any swelling or bruising at the site, or you develop a fever > 101? F after you are discharged home, call the office at once.  Special Instructions - You are still able to use cellular telephones; use the ear opposite the side where you have your pacemaker/defibrillator.  Avoid carrying your cellular phone near your device. - When traveling through airports, show security personnel your identification card to avoid being screened in the metal detectors.  Ask the security personnel to use the hand wand. - Avoid arc welding equipment, MRI testing (magnetic resonance imaging), TENS units (transcutaneous nerve stimulators).  Call the office for questions about other devices. - Avoid electrical appliances that are in poor condition or are not properly grounded. - Microwave ovens are safe to be near or to operate.  Additional  information for defibrillator patients should your device go off: - If your device goes off ONCE and you feel fine afterward, notify the device clinic nurses. - If your device goes off ONCE and you do not feel well afterward, call 911. - If your device goes off TWICE, call 911. - If your device goes off THREE times in one day, call 911.  DO NOT DRIVE YOURSELF OR A FAMILY MEMBER WITH A DEFIBRILLATOR TO THE HOSPITAL--CALL 911.

## 2018-05-23 ENCOUNTER — Encounter (HOSPITAL_COMMUNITY): Payer: Self-pay | Admitting: Cardiovascular Disease

## 2018-05-23 ENCOUNTER — Ambulatory Visit (HOSPITAL_COMMUNITY): Payer: Medicare Other

## 2018-05-23 DIAGNOSIS — E669 Obesity, unspecified: Secondary | ICD-10-CM | POA: Diagnosis not present

## 2018-05-23 DIAGNOSIS — R0989 Other specified symptoms and signs involving the circulatory and respiratory systems: Secondary | ICD-10-CM | POA: Diagnosis not present

## 2018-05-23 DIAGNOSIS — I495 Sick sinus syndrome: Secondary | ICD-10-CM | POA: Diagnosis not present

## 2018-05-23 DIAGNOSIS — I25118 Atherosclerotic heart disease of native coronary artery with other forms of angina pectoris: Secondary | ICD-10-CM | POA: Diagnosis not present

## 2018-05-23 DIAGNOSIS — C3490 Malignant neoplasm of unspecified part of unspecified bronchus or lung: Secondary | ICD-10-CM | POA: Diagnosis not present

## 2018-05-23 DIAGNOSIS — E7800 Pure hypercholesterolemia, unspecified: Secondary | ICD-10-CM | POA: Diagnosis not present

## 2018-05-23 DIAGNOSIS — I10 Essential (primary) hypertension: Secondary | ICD-10-CM | POA: Diagnosis not present

## 2018-05-23 DIAGNOSIS — E785 Hyperlipidemia, unspecified: Secondary | ICD-10-CM | POA: Diagnosis not present

## 2018-05-23 DIAGNOSIS — Z7401 Bed confinement status: Secondary | ICD-10-CM | POA: Diagnosis not present

## 2018-05-23 DIAGNOSIS — F329 Major depressive disorder, single episode, unspecified: Secondary | ICD-10-CM | POA: Diagnosis not present

## 2018-05-23 DIAGNOSIS — F419 Anxiety disorder, unspecified: Secondary | ICD-10-CM | POA: Diagnosis not present

## 2018-05-23 DIAGNOSIS — G40909 Epilepsy, unspecified, not intractable, without status epilepticus: Secondary | ICD-10-CM | POA: Diagnosis not present

## 2018-05-23 DIAGNOSIS — Z87891 Personal history of nicotine dependence: Secondary | ICD-10-CM | POA: Diagnosis not present

## 2018-05-23 DIAGNOSIS — Z950 Presence of cardiac pacemaker: Secondary | ICD-10-CM | POA: Diagnosis not present

## 2018-05-23 DIAGNOSIS — E039 Hypothyroidism, unspecified: Secondary | ICD-10-CM | POA: Diagnosis not present

## 2018-05-23 DIAGNOSIS — M2550 Pain in unspecified joint: Secondary | ICD-10-CM | POA: Diagnosis not present

## 2018-05-23 LAB — BASIC METABOLIC PANEL
Anion gap: 8 (ref 5–15)
BUN: 11 mg/dL (ref 8–23)
CALCIUM: 9.1 mg/dL (ref 8.9–10.3)
CO2: 21 mmol/L — ABNORMAL LOW (ref 22–32)
CREATININE: 0.8 mg/dL (ref 0.44–1.00)
Chloride: 103 mmol/L (ref 98–111)
Glucose, Bld: 105 mg/dL — ABNORMAL HIGH (ref 70–99)
Potassium: 4 mmol/L (ref 3.5–5.1)
SODIUM: 132 mmol/L — AB (ref 135–145)

## 2018-05-23 LAB — CBC
HCT: 31.8 % — ABNORMAL LOW (ref 36.0–46.0)
HEMOGLOBIN: 10.7 g/dL — AB (ref 12.0–15.0)
MCH: 34.4 pg — ABNORMAL HIGH (ref 26.0–34.0)
MCHC: 33.6 g/dL (ref 30.0–36.0)
MCV: 102.3 fL — ABNORMAL HIGH (ref 80.0–100.0)
PLATELETS: 101 10*3/uL — AB (ref 150–400)
RBC: 3.11 MIL/uL — ABNORMAL LOW (ref 3.87–5.11)
RDW: 17.1 % — ABNORMAL HIGH (ref 11.5–15.5)
WBC: 4.4 10*3/uL (ref 4.0–10.5)
nRBC: 0 % (ref 0.0–0.2)

## 2018-05-23 MED ORDER — AMLODIPINE BESYLATE 2.5 MG PO TABS: 2.5000 mg | ORAL_TABLET | Freq: Every day | ORAL | 6 refills | Status: DC

## 2018-05-23 MED ORDER — GUAIFENESIN-DM 100-10 MG/5ML PO SYRP
5.0000 mL | ORAL_SOLUTION | ORAL | Status: DC | PRN
  Administered 2018-05-23: 18:00:00 5 mL via ORAL
  Filled 2018-05-23: qty 5

## 2018-05-23 NOTE — Discharge Summary (Addendum)
ELECTROPHYSIOLOGY PROCEDURE DISCHARGE SUMMARY    Patient ID: [name],  MRN: [id number], DOB/AGE: 1934-01-20 84 y.o.  Admit date: 05/22/2018 Discharge date: 05/23/2018  Primary Care Physician: Eustaquio Maize, MD  Primary Cardiologist: Dr. Gwenlyn Found Electrophysiologist: Dr. Rayann Heman  Primary Discharge Diagnosis:  1. Symptomatic bradycardia status post pacemaker implantation this admission 2. CP     S/p cath this admission, no obstructive disease  Secondary Discharge Diagnosis:  1. HTN 2. Paroxysmal Afib     CHA2DS2Vasc is at least 6, on Xarelto 3. Hx of CVA/TIA     remote        Allergies  Allergen Reactions  . Lisinopril Cough     Procedures This Admission:  1. 05/22/18 LHC     Prox RCA lesion is 30% stenosed. 2.  Implantation of a MDT dual chamber PPM on 05/22/18 by Dr Rayann Heman.  The patient received a Medtronic Azure XT DR MRI SureScan model [id number] (serial number [id number]) pacemaker, Medtronic model [id number] (serial number [id number]) right atrial lead and a Medtronic model 5076- 58 (serial number [id number]) right ventricular lead. There were no immediate post procedure complications. 3.  CXR on 05/23/18 demonstrated no pneumothorax status post device implantation.   Brief HPI: [name] is a 84 y.o. female is followed in the outpatient setting by cardiology and EP service  Past medical history includes above.  The patient has had CP and symptomatic bradycardia without reversible causes identified.  Planned for LHC and PPM implant.  Risks, benefits, and alternatives to both procedures were reviewed with the patient who wished to proceed.   Hospital Course:  The patient was admitted and underwent LHC noting no obstructive CAD followed by implantation of a PPM  with details as outlined above. She was monitored on telemetry overnight which demonstrated SR/AP v sensed rhythm.  Left chest was without hematoma, minimal ecchymosis at both pacer implant site  and loop extraction site.  R wrist also stable without hematoma or bleeding.  The device was interrogated and found to be functioning normally.  CXR was obtained and demonstrated no pneumothorax status post device implantation.  Wound care, arm mobility, and restrictions were reviewed with the patient.  Her husband requested help at home with some UE limitations given LHC wrist and pacer LUE restrictions.  PT was consulted, will plan for discharge once needs assessed and home arrangements made.  The patient feels OK this morning, denies any SOB or CP, denies pain at her cath site or pacer/loop sites.  She had persistantly elevated BP here treated with Norvasc, will discharge with low dose.  She was examined by Dr. Rayann Heman and considered stable for discharge to home.    Physical Exam: Vitals:   05/22/18 1930 05/22/18 2000 05/23/18 0545 05/23/18 0805  BP: (!) 169/65 (!) 163/63 (!) 137/40 (!) 150/46  Pulse: 77 80 78 66  Resp: 18 (!) 21 20 20   Temp:   98.5 F (36.9 C) (!) 97.5 F (36.4 C)  TempSrc:   Oral Oral  SpO2: 95% 100% 100% 92%  Weight:   107 kg   Height:        GEN- The patient is well appearing, alert and oriented x 3 today.   HEENT: normocephalic, atraumatic; sclera clear, conjunctiva pink; hearing intact; oropharynx clear; neck supple, no JVP Lungs- CTA b/l, normal work of breathing.  No wheezes, rales, rhonchi Heart- RRR, no murmurs, rubs or gallops, PMI not laterally displaced GI- soft, non-tender, non-distended Extremities-  no clubbing, cyanosis, or edema; R radial pulse 2+ bilaterally, minimal ecchymosis  MS- no significant deformity or atrophy Skin- warm and dry, no rash or lesion, left chest without hematoma, minimal ecchymosis Psych- euthymic mood, full affect Neuro- no gross deficits   Labs:   Lab Results  Component Value Date   WBC 4.4 05/23/2018   HGB 10.7 (L) 05/23/2018   HCT 31.8 (L) 05/23/2018   MCV 102.3 (H) 05/23/2018   PLT 101 (L) 05/23/2018    Recent  Labs  Lab 05/16/18 1515  05/23/18 0344  NA 138   < > 132*  K 6.2*   < > 4.0  CL 99   < > 103  CO2 21   < > 21*  BUN 12   < > 11  CREATININE 0.88   < > 0.80  CALCIUM 10.0   < > 9.1  PROT 6.4  --   --   BILITOT 0.6  --   --   ALKPHOS 86  --   --   ALT 14  --   --   AST 31  --   --   GLUCOSE 108*   < > 105*   < > = values in this interval not displayed.    Discharge Medications:  Allergies as of 05/23/2018      Reactions   Lisinopril Cough      Medication List    TAKE these medications   ALPRAZolam 0.25 MG tablet Commonly known as:  XANAX Take 1 tablet (0.25 mg total) by mouth 2 (two) times daily as needed for anxiety. What changed:  when to take this   amitriptyline 25 MG tablet Commonly known as:  ELAVIL Take 1 tablet (25 mg total) by mouth at bedtime.   amLODipine 2.5 MG tablet Commonly known as:  NORVASC Take 1 tablet (2.5 mg total) by mouth daily. Start taking on:  05/24/2018   capecitabine 500 MG tablet Commonly known as:  XELODA Take 3 Tablets (1500mg ) by mouth twice daily after a meal. Take on days 1-14 of each 28d cycle   escitalopram 20 MG tablet Commonly known as:  LEXAPRO Take 1 tablet (20 mg total) by mouth at bedtime.   fluticasone 50 MCG/ACT nasal spray Commonly known as:  FLONASE Place 2 sprays into both nostrils at bedtime.   furosemide 20 MG tablet Commonly known as:  LASIX TAKE 1 TABLET DAILY AS NEEDED FOR FLUID RETENTION What changed:    how much to take  how to take this  when to take this  reasons to take this   gabapentin 400 MG capsule Commonly known as:  NEURONTIN Take 1-2 capsules (400-800 mg total) by mouth 3 (three) times daily. What changed:  how much to take   hydroxypropyl methylcellulose / hypromellose 2.5 % ophthalmic solution Commonly known as:  ISOPTO TEARS / GONIOVISC Place 1 drop into both eyes daily.   levothyroxine 75 MCG tablet Commonly known as:  SYNTHROID, LEVOTHROID TAKE 1 TABLET DAILY BEFORE  BREAKFAST   lovastatin 40 MG tablet Commonly known as:  MEVACOR Take 1 tablet (40 mg total) by mouth at bedtime.   Melatonin 3 MG Tbdp Take 3-6 mg by mouth at bedtime as needed.   omeprazole 40 MG capsule Commonly known as:  PRILOSEC Take 1 capsule (40 mg total) by mouth daily.   ondansetron 8 MG tablet Commonly known as:  ZOFRAN Take 1 tablet (8 mg total) by mouth every 8 (eight) hours as needed for nausea  or vomiting.   OXcarbazepine 150 MG tablet Commonly known as:  TRILEPTAL Take 1 tablet (150 mg total) by mouth 2 (two) times daily.   oxyCODONE-acetaminophen 5-325 MG tablet Commonly known as:  PERCOCET/ROXICET Take 1 tablet by mouth every 8 (eight) hours as needed for severe pain.   rivaroxaban 20 MG Tabs tablet Commonly known as:  XARELTO Take 1 tablet (20 mg total) by mouth daily with supper. Notes to patient:  Resume Tuesday 05/27/18       Disposition:  Home   Follow-up Information    [facility] Follow up on 06/04/2018.   Specialty:  Cardiology Why:  11:00AM, wound check visit Contact information: [address] [place]       Lorretta Harp, MD Follow up on 05/30/2018.   Specialties:  Cardiology, Radiology Why:  1:45PM Contact information: [address] [place] [address] [phone]        Thompson Grayer, MD Follow up on 08/27/2018.   Specialty:  Cardiology Why:  11:00AM Contact information: [place] [address] [phone]           Duration of Discharge Encounter: Greater than 30 minutes including physician time.  Signed, Tommye Standard, PA-C 05/23/2018 10:59 AM   I have seen, examined the patient, and reviewed the above assessment and plan.  Changes to above are made where necessary.  On exam, RRR.  Doing well s/p PPM.  Device interrogation reviewed and normal  Co Sign: Thompson Grayer, MD 05/23/2018 9:55  PM

## 2018-05-23 NOTE — Progress Notes (Signed)
Device interrogation reviewed and normal CXR reveals stable leads, no ptx.  Thompson Grayer MD, [facility] 05/23/2018 10:00 AM

## 2018-05-23 NOTE — Care Management Note (Signed)
Case Management Note  Patient Details  Name: [name] MRN: [id number] Date of Birth: 05-Dec-1933  Subjective/Objective:     From home with spouse, s/p pace maker, she is unable to lift her arm and unable to use other arm do to radial cath.  She has a hospital bed, w/chair, shower chair at home.  PT to see patient, she states she would like to work with [facility] if she need [place] services. she will need ambulance transport to go home today also.  She will need [employer] per pt.   Patient has an aide for 3 per day.  Spouse notified of dc plans he states to have ptar transport patient in an hour , ptar called , address confirmed. Will transport by 2:30.  Referral made to [facility] with [facility] for [facility], [place], [place], [place] work.  Soc will begin 24-48 hrs post dc.                Action/Plan: DC home when ready.  Expected Discharge Date:  05/23/18               Expected Discharge Plan:  [place]  In-House Referral:     Discharge planning Services  CM Consult  Post Acute Care Choice:  Home Health Choice offered to:  Patient  DME Arranged:  Other see comment(HOYER LIFT) DME Agency:  [place] Arranged:  RN, PT, OT, Social Work [employer] Agency:  [place]  Status of Service:  Completed, signed off  If discussed at [employer], dates discussed:    Additional Comments:  Zenon Mayo, RN 05/23/2018, 2:32 PM

## 2018-05-23 NOTE — Evaluation (Signed)
Physical Therapy Evaluation Patient Details Name: [name] MRN: [id number] DOB: Oct 21, 1933 Today's Date: 05/23/2018   History of Present Illness  Pt is an 84 y/o female s/p L sided pacemaker insertion. Pt is also s/p L heart cath. Pt with recent ED visit after fall from toilet resulting in L ankle sprain. PMH includes HTN, CVA, and cerebellar degeneration.   Clinical Impression  Pt admitted secondary to problem above with deficits below. Pt requiring total A for bed mobility, as she is unable to use UEs secondary to restrictions from heart cath and pacemaker insertion. Pt refusing to stand secondary to L ankle pain. Feel pt is at increased risk for falls and will need increased assist at home. Recommending SNF, however, pt will likely not be eligible. If not eligible will need HHPT, [place], HHRN, HHAide, and hoyer lift with sling to improve mobility. Pt will also likely need ambulance transport home. Will continue to follow acutely to maximize functional mobility independence and safety.     Follow Up Recommendations SNF;Supervision/Assistance - 24 hour(likely uneligible; will need HHPT, HHOT, HHRN, HHaide)    Equipment Recommendations  Other (comment)(hoyer lift, hoyer lift pad)    Recommendations for Other Services       Precautions / Restrictions Precautions Precautions: Fall;ICD/Pacemaker Precaution Comments: Reviewed pacemaker precautions.  Restrictions Weight Bearing Restrictions: Yes RUE Weight Bearing: (heart cath precautions) LUE Weight Bearing: Non weight bearing      Mobility  Bed Mobility Overal bed mobility: Needs Assistance Bed Mobility: Supine to Sit;Sit to Supine     Supine to sit: Total assist Sit to supine: Total assist   General bed mobility comments: Total A to come to EOB and return to supine. Pt helping minimally. Pt requiring min guard to min A to maintain sitting balance. Verbal cues not to use UEs during transfers.  Transfers                  General transfer comment: Pt refusing secondary to LLE pain.   Ambulation/Gait                Stairs            Wheelchair Mobility    Modified Rankin (Stroke Patients Only)       Balance Overall balance assessment: Needs assistance Sitting-balance support: No upper extremity supported;Feet supported Sitting balance-Leahy Scale: Poor Sitting balance - Comments: Reliant on min to min guard A for sitting balance without UE support.                                      Pertinent Vitals/Pain Pain Assessment: Faces Faces Pain Scale: Hurts even more Pain Location: L ankle, L pacemaker site Pain Descriptors / Indicators: Aching;Grimacing;Guarding Pain Intervention(s): Limited activity within patient's tolerance;Monitored during session;Repositioned    Home Living Family/patient expects to be discharged to:: Private residence Living Arrangements: Spouse/significant other Available Help at Discharge: Family;Available 24 hours/day Type of Home: House Home Access: Ramped entrance     Home Layout: Multi-level;Able to live on main level with bedroom/bathroom Home Equipment: Gilford Rile - 2 wheels;Walker - 4 wheels;Grab bars - tub/shower;Shower seat;Wheelchair - manual;Hospital bed Additional Comments: Reports sister will be staying with her as well.     Prior Function Level of Independence: Needs assistance   Gait / Transfers Assistance Needed: Pt reports 2 people have to help her get into her WC.   ADL's / [employer]  Needed: Pt reports husband helps with transfers and ADL tasks.         Hand Dominance        Extremity/Trunk Assessment   Upper Extremity Assessment Upper Extremity Assessment: LUE deficits/detail LUE Deficits / Details: Limited ROM following pacemaker insertion.     Lower Extremity Assessment Lower Extremity Assessment: Generalized weakness;LLE deficits/detail LLE Deficits / Details: L ankle pain from fall at home         Communication   Communication: No difficulties  Cognition Arousal/Alertness: Awake/alert Behavior During Therapy: WFL for tasks assessed/performed Overall Cognitive Status: No family/caregiver present to determine baseline cognitive functioning                                        General Comments General comments (skin integrity, edema, etc.): No family present. Case manager present during session. Educated about need for hoyer lift to increase safety with mobility.     Exercises     Assessment/Plan    PT Assessment Patient needs continued PT services  PT Problem List Cardiopulmonary status limiting activity;Pain;Decreased mobility;Decreased balance;Decreased strength;Decreased activity tolerance;Decreased knowledge of use of DME       PT Treatment Interventions DME instruction;Functional mobility training;Therapeutic activities;Balance training;Therapeutic exercise;Patient/family education;Wheelchair mobility training    PT Goals (Current goals can be found in the Care Plan section)  Acute Rehab PT Goals Patient Stated Goal: to go home PT Goal Formulation: With patient Time For Goal Achievement: 06/06/18 Potential to Achieve Goals: Fair    Frequency Min 3X/week   Barriers to discharge        Co-evaluation               AM-PAC PT "6 Clicks" Daily Activity  Outcome Measure Difficulty turning over in bed (including adjusting bedclothes, sheets and blankets)?: Unable Difficulty moving from lying on back to sitting on the side of the bed? : Unable Difficulty sitting down on and standing up from a chair with arms (e.g., wheelchair, bedside commode, etc,.)?: Unable Help needed moving to and from a bed to chair (including a wheelchair)?: Total Help needed walking in hospital room?: Total Help needed climbing 3-5 steps with a railing? : Total 6 Click Score: 6    End of Session   Activity Tolerance: Patient limited by pain Patient left: in  bed;with call bell/phone within reach;with bed alarm set Nurse Communication: Mobility status;Need for lift equipment PT Visit Diagnosis: Unsteadiness on feet (R26.81);Muscle weakness (generalized) (M62.81);Difficulty in walking, not elsewhere classified (R26.2)    Time: 1062-6948 PT Time Calculation (min) (ACUTE ONLY): 23 min   Charges:   PT Evaluation $PT Eval Moderate Complexity: 1 Mod PT Treatments $Therapeutic Activity: 8-22 mins        Leighton Ruff, PT, DPT  Acute Rehabilitation Services  Pager: [phone] Office: [phone]   Rudean Hitt 05/23/2018, 2:03 PM

## 2018-05-23 NOTE — Care Management Note (Addendum)
Case Management Note  Patient Details  Name: [name] MRN: [id number] Date of Birth: July 09, 1934  Subjective/Objective:   From home with spouse, s/p pace maker, she is unable to lift her arm and unable to use other arm do to radial cath.  She has a hospital bed, w/chair, shower chair at home.  PT to see patient, she states she would like to work with [facility] if she need [place] services. she will need ambulance transport to go home today also.  She will need [employer] per pt.   Patient has an aide for 3 per day.  Spouse notified of dc plans he states to have ptar transport patient in an hour , ptar called , address confirmed. Will transport by 2:30.             Action/Plan: NCM will follow for transition of care needs.   Expected Discharge Date:  05/23/18               Expected Discharge Plan:  [place]  In-House Referral:     Discharge planning Services  CM Consult  Post Acute Care Choice:  Home Health Choice offered to:  Patient  DME Arranged:    DME Agency:     HH Arranged:    [place] Agency:     Status of Service:  In process, will continue to follow  If discussed at Long Length of Stay Meetings, dates discussed:    Additional Comments:  Zenon Mayo, RN 05/23/2018, 1:10 PM

## 2018-05-24 DIAGNOSIS — C787 Secondary malignant neoplasm of liver and intrahepatic bile duct: Secondary | ICD-10-CM | POA: Diagnosis not present

## 2018-05-24 DIAGNOSIS — I309 Acute pericarditis, unspecified: Secondary | ICD-10-CM | POA: Diagnosis not present

## 2018-05-24 DIAGNOSIS — C3490 Malignant neoplasm of unspecified part of unspecified bronchus or lung: Secondary | ICD-10-CM | POA: Diagnosis not present

## 2018-05-24 DIAGNOSIS — D696 Thrombocytopenia, unspecified: Secondary | ICD-10-CM | POA: Diagnosis not present

## 2018-05-24 DIAGNOSIS — R339 Retention of urine, unspecified: Secondary | ICD-10-CM | POA: Diagnosis not present

## 2018-05-24 DIAGNOSIS — C259 Malignant neoplasm of pancreas, unspecified: Secondary | ICD-10-CM | POA: Diagnosis not present

## 2018-05-25 ENCOUNTER — Other Ambulatory Visit: Payer: Self-pay | Admitting: Internal Medicine

## 2018-05-25 DIAGNOSIS — I2699 Other pulmonary embolism without acute cor pulmonale: Secondary | ICD-10-CM

## 2018-05-26 ENCOUNTER — Inpatient Hospital Stay: Payer: Medicare Other | Admitting: Oncology

## 2018-05-26 ENCOUNTER — Telehealth: Payer: Self-pay

## 2018-05-26 ENCOUNTER — Inpatient Hospital Stay: Payer: Medicare Other

## 2018-05-26 DIAGNOSIS — F329 Major depressive disorder, single episode, unspecified: Secondary | ICD-10-CM | POA: Diagnosis not present

## 2018-05-26 DIAGNOSIS — Z950 Presence of cardiac pacemaker: Secondary | ICD-10-CM | POA: Diagnosis not present

## 2018-05-26 DIAGNOSIS — D539 Nutritional anemia, unspecified: Secondary | ICD-10-CM | POA: Diagnosis not present

## 2018-05-26 DIAGNOSIS — I480 Paroxysmal atrial fibrillation: Secondary | ICD-10-CM | POA: Diagnosis not present

## 2018-05-26 DIAGNOSIS — G40909 Epilepsy, unspecified, not intractable, without status epilepticus: Secondary | ICD-10-CM | POA: Diagnosis not present

## 2018-05-26 DIAGNOSIS — C3490 Malignant neoplasm of unspecified part of unspecified bronchus or lung: Secondary | ICD-10-CM | POA: Diagnosis not present

## 2018-05-26 DIAGNOSIS — Z466 Encounter for fitting and adjustment of urinary device: Secondary | ICD-10-CM | POA: Diagnosis not present

## 2018-05-26 DIAGNOSIS — R339 Retention of urine, unspecified: Secondary | ICD-10-CM | POA: Diagnosis not present

## 2018-05-26 DIAGNOSIS — I2510 Atherosclerotic heart disease of native coronary artery without angina pectoris: Secondary | ICD-10-CM | POA: Diagnosis not present

## 2018-05-26 DIAGNOSIS — F411 Generalized anxiety disorder: Secondary | ICD-10-CM | POA: Diagnosis not present

## 2018-05-26 DIAGNOSIS — Z79899 Other long term (current) drug therapy: Secondary | ICD-10-CM | POA: Diagnosis not present

## 2018-05-26 DIAGNOSIS — E669 Obesity, unspecified: Secondary | ICD-10-CM | POA: Diagnosis not present

## 2018-05-26 DIAGNOSIS — Z79891 Long term (current) use of opiate analgesic: Secondary | ICD-10-CM | POA: Diagnosis not present

## 2018-05-26 DIAGNOSIS — C259 Malignant neoplasm of pancreas, unspecified: Secondary | ICD-10-CM | POA: Diagnosis not present

## 2018-05-26 DIAGNOSIS — Z6841 Body Mass Index (BMI) 40.0 and over, adult: Secondary | ICD-10-CM | POA: Diagnosis not present

## 2018-05-26 DIAGNOSIS — D696 Thrombocytopenia, unspecified: Secondary | ICD-10-CM | POA: Diagnosis not present

## 2018-05-26 DIAGNOSIS — Z7901 Long term (current) use of anticoagulants: Secondary | ICD-10-CM | POA: Diagnosis not present

## 2018-05-26 DIAGNOSIS — Z95828 Presence of other vascular implants and grafts: Secondary | ICD-10-CM | POA: Diagnosis not present

## 2018-05-26 DIAGNOSIS — I10 Essential (primary) hypertension: Secondary | ICD-10-CM | POA: Diagnosis not present

## 2018-05-26 DIAGNOSIS — Z86711 Personal history of pulmonary embolism: Secondary | ICD-10-CM | POA: Diagnosis not present

## 2018-05-26 DIAGNOSIS — R001 Bradycardia, unspecified: Secondary | ICD-10-CM | POA: Diagnosis not present

## 2018-05-26 DIAGNOSIS — Z9181 History of falling: Secondary | ICD-10-CM | POA: Diagnosis not present

## 2018-05-26 DIAGNOSIS — C787 Secondary malignant neoplasm of liver and intrahepatic bile duct: Secondary | ICD-10-CM | POA: Diagnosis not present

## 2018-05-26 DIAGNOSIS — Z48812 Encounter for surgical aftercare following surgery on the circulatory system: Secondary | ICD-10-CM | POA: Diagnosis not present

## 2018-05-26 DIAGNOSIS — Z8673 Personal history of transient ischemic attack (TIA), and cerebral infarction without residual deficits: Secondary | ICD-10-CM | POA: Diagnosis not present

## 2018-05-26 LAB — CUP PACEART REMOTE DEVICE CHECK
MDC IDC PG IMPLANT DT: 20160609
MDC IDC SESS DTM: 20191017024114

## 2018-05-26 NOTE — Telephone Encounter (Signed)
Dr Evette Doffing patient-  Patient is bed bound incontinent  Home health recerting her and would like verbal order for foley cath   Had before 14 french 10 mm balloon

## 2018-05-26 NOTE — Telephone Encounter (Signed)
[place] for verbal order for foley cath

## 2018-05-27 ENCOUNTER — Telehealth: Payer: Self-pay | Admitting: Cardiology

## 2018-05-27 ENCOUNTER — Telehealth: Payer: Self-pay | Admitting: *Deleted

## 2018-05-27 DIAGNOSIS — I2510 Atherosclerotic heart disease of native coronary artery without angina pectoris: Secondary | ICD-10-CM | POA: Diagnosis not present

## 2018-05-27 DIAGNOSIS — I480 Paroxysmal atrial fibrillation: Secondary | ICD-10-CM | POA: Diagnosis not present

## 2018-05-27 DIAGNOSIS — Z48812 Encounter for surgical aftercare following surgery on the circulatory system: Secondary | ICD-10-CM | POA: Diagnosis not present

## 2018-05-27 DIAGNOSIS — I10 Essential (primary) hypertension: Secondary | ICD-10-CM | POA: Diagnosis not present

## 2018-05-27 DIAGNOSIS — Z950 Presence of cardiac pacemaker: Secondary | ICD-10-CM | POA: Diagnosis not present

## 2018-05-27 DIAGNOSIS — R001 Bradycardia, unspecified: Secondary | ICD-10-CM | POA: Diagnosis not present

## 2018-05-27 MED FILL — Lidocaine HCl Local Preservative Free (PF) Inj 1%: INTRAMUSCULAR | Qty: 30 | Status: CN

## 2018-05-27 MED FILL — Lidocaine HCl Local Inj 1%: INTRAMUSCULAR | Qty: 60 | Status: AC

## 2018-05-27 NOTE — Telephone Encounter (Signed)
Spoke w/ pt son and requested that she send a manual transmission b/c her home monitor has not updated in at least 14 days.

## 2018-05-27 NOTE — Telephone Encounter (Signed)
[name] with [facility] notified ok for foley catheter per [facility]

## 2018-05-28 DIAGNOSIS — Z950 Presence of cardiac pacemaker: Secondary | ICD-10-CM | POA: Diagnosis not present

## 2018-05-28 DIAGNOSIS — Z48812 Encounter for surgical aftercare following surgery on the circulatory system: Secondary | ICD-10-CM | POA: Diagnosis not present

## 2018-05-28 DIAGNOSIS — I2510 Atherosclerotic heart disease of native coronary artery without angina pectoris: Secondary | ICD-10-CM | POA: Diagnosis not present

## 2018-05-28 DIAGNOSIS — I480 Paroxysmal atrial fibrillation: Secondary | ICD-10-CM | POA: Diagnosis not present

## 2018-05-28 DIAGNOSIS — R001 Bradycardia, unspecified: Secondary | ICD-10-CM | POA: Diagnosis not present

## 2018-05-28 DIAGNOSIS — I10 Essential (primary) hypertension: Secondary | ICD-10-CM | POA: Diagnosis not present

## 2018-05-29 DIAGNOSIS — I480 Paroxysmal atrial fibrillation: Secondary | ICD-10-CM | POA: Diagnosis not present

## 2018-05-29 DIAGNOSIS — I10 Essential (primary) hypertension: Secondary | ICD-10-CM | POA: Diagnosis not present

## 2018-05-29 DIAGNOSIS — Z950 Presence of cardiac pacemaker: Secondary | ICD-10-CM | POA: Diagnosis not present

## 2018-05-29 DIAGNOSIS — I2510 Atherosclerotic heart disease of native coronary artery without angina pectoris: Secondary | ICD-10-CM | POA: Diagnosis not present

## 2018-05-29 DIAGNOSIS — R001 Bradycardia, unspecified: Secondary | ICD-10-CM | POA: Diagnosis not present

## 2018-05-29 DIAGNOSIS — Z48812 Encounter for surgical aftercare following surgery on the circulatory system: Secondary | ICD-10-CM | POA: Diagnosis not present

## 2018-05-30 ENCOUNTER — Ambulatory Visit: Payer: Medicare Other | Admitting: Cardiovascular Disease

## 2018-05-30 DIAGNOSIS — Z48812 Encounter for surgical aftercare following surgery on the circulatory system: Secondary | ICD-10-CM | POA: Diagnosis not present

## 2018-05-30 DIAGNOSIS — Z950 Presence of cardiac pacemaker: Secondary | ICD-10-CM | POA: Diagnosis not present

## 2018-05-30 DIAGNOSIS — I10 Essential (primary) hypertension: Secondary | ICD-10-CM | POA: Diagnosis not present

## 2018-05-30 DIAGNOSIS — I480 Paroxysmal atrial fibrillation: Secondary | ICD-10-CM | POA: Diagnosis not present

## 2018-05-30 DIAGNOSIS — R001 Bradycardia, unspecified: Secondary | ICD-10-CM | POA: Diagnosis not present

## 2018-05-30 DIAGNOSIS — I2510 Atherosclerotic heart disease of native coronary artery without angina pectoris: Secondary | ICD-10-CM | POA: Diagnosis not present

## 2018-05-30 MED FILL — TEMOZOLOMIDE 140 MG CAPSULE: 140 | 28 days supply | Qty: 10 | Fill #1

## 2018-05-30 MED FILL — CAPECITABINE 500 MG TABS: 500 | 14 days supply | Qty: 84 | Fill #1

## 2018-06-02 ENCOUNTER — Telehealth: Payer: Self-pay

## 2018-06-02 DIAGNOSIS — I2510 Atherosclerotic heart disease of native coronary artery without angina pectoris: Secondary | ICD-10-CM | POA: Diagnosis not present

## 2018-06-02 DIAGNOSIS — I480 Paroxysmal atrial fibrillation: Secondary | ICD-10-CM | POA: Diagnosis not present

## 2018-06-02 DIAGNOSIS — Z950 Presence of cardiac pacemaker: Secondary | ICD-10-CM | POA: Diagnosis not present

## 2018-06-02 DIAGNOSIS — I10 Essential (primary) hypertension: Secondary | ICD-10-CM | POA: Diagnosis not present

## 2018-06-02 DIAGNOSIS — Z48812 Encounter for surgical aftercare following surgery on the circulatory system: Secondary | ICD-10-CM | POA: Diagnosis not present

## 2018-06-02 DIAGNOSIS — R001 Bradycardia, unspecified: Secondary | ICD-10-CM | POA: Diagnosis not present

## 2018-06-02 NOTE — Telephone Encounter (Signed)
Patients son [name] called concerned about his mother. He states that since she had her pacemaker placed the she hasn't had a BM in 2 weeks and has bed sores per his sister who lives next door. He hasn't been able to visit with her because he has had the flu. After speaking with him the sister called stating the same concerns and states that mom has to lay flat on her back all the time and her step dad says her bottom looks bad. She advised that the nurse from [facility] come out twice a week but they feel like she needs additional care. Please review and advise. Daughter [name] or son [name] would like to speak with you. [name] is at [phone] and [name] is [phone]

## 2018-06-02 NOTE — Telephone Encounter (Signed)
Returned phone call to [place], no answer.   Called Ms Aguado directly, discussed with husband.   No stool x 2 weeks. Taking miralax twice a day has not helped, started 2-3 days ago. Has mag citrate at home planning to try tonight. Has an enema that will be tried when PT arrives. Will let me know tomorrow if still no stool.   Pt now bed bound, husband previously able to lift her only because he was able to pull some on her arms to help, pt with cerebellar ataxia, is wheelchair bound but was able to partially weight bear to help with transfers before past couple of weeks. No one other than family has seen bed sores.   Has bed sores that started after incontinence at home. Now with foley catheter. [facility] nurse coming out twice a week. PT coming a couple times a week.

## 2018-06-03 ENCOUNTER — Other Ambulatory Visit: Payer: Self-pay | Admitting: *Deleted

## 2018-06-03 DIAGNOSIS — R001 Bradycardia, unspecified: Secondary | ICD-10-CM | POA: Diagnosis not present

## 2018-06-03 DIAGNOSIS — Z48812 Encounter for surgical aftercare following surgery on the circulatory system: Secondary | ICD-10-CM | POA: Diagnosis not present

## 2018-06-03 DIAGNOSIS — I10 Essential (primary) hypertension: Secondary | ICD-10-CM | POA: Diagnosis not present

## 2018-06-03 DIAGNOSIS — Z950 Presence of cardiac pacemaker: Secondary | ICD-10-CM | POA: Diagnosis not present

## 2018-06-03 DIAGNOSIS — I480 Paroxysmal atrial fibrillation: Secondary | ICD-10-CM | POA: Diagnosis not present

## 2018-06-03 DIAGNOSIS — I2510 Atherosclerotic heart disease of native coronary artery without angina pectoris: Secondary | ICD-10-CM | POA: Diagnosis not present

## 2018-06-04 ENCOUNTER — Ambulatory Visit (INDEPENDENT_AMBULATORY_CARE_PROVIDER_SITE_OTHER): Payer: Medicare Other | Admitting: *Deleted

## 2018-06-04 ENCOUNTER — Other Ambulatory Visit: Payer: Self-pay | Admitting: Pediatrics

## 2018-06-04 DIAGNOSIS — R001 Bradycardia, unspecified: Secondary | ICD-10-CM | POA: Diagnosis not present

## 2018-06-04 DIAGNOSIS — I495 Sick sinus syndrome: Secondary | ICD-10-CM | POA: Diagnosis not present

## 2018-06-04 DIAGNOSIS — R238 Other skin changes: Secondary | ICD-10-CM

## 2018-06-04 DIAGNOSIS — I10 Essential (primary) hypertension: Secondary | ICD-10-CM | POA: Diagnosis not present

## 2018-06-04 DIAGNOSIS — I480 Paroxysmal atrial fibrillation: Secondary | ICD-10-CM | POA: Diagnosis not present

## 2018-06-04 DIAGNOSIS — B379 Candidiasis, unspecified: Secondary | ICD-10-CM

## 2018-06-04 DIAGNOSIS — I2510 Atherosclerotic heart disease of native coronary artery without angina pectoris: Secondary | ICD-10-CM | POA: Diagnosis not present

## 2018-06-04 DIAGNOSIS — Z48812 Encounter for surgical aftercare following surgery on the circulatory system: Secondary | ICD-10-CM | POA: Diagnosis not present

## 2018-06-04 DIAGNOSIS — Z950 Presence of cardiac pacemaker: Secondary | ICD-10-CM | POA: Diagnosis not present

## 2018-06-04 DIAGNOSIS — L909 Atrophic disorder of skin, unspecified: Principal | ICD-10-CM

## 2018-06-04 LAB — CUP PACEART INCLINIC DEVICE CHECK
Battery Remaining Longevity: 144 mo
Battery Voltage: 3.21 V
Brady Statistic AS VS Percent: 5.73 %
Brady Statistic RA Percent Paced: 94.29 %
Brady Statistic RV Percent Paced: 0.06 %
Implantable Lead Implant Date: 20191024
Implantable Lead Implant Date: 20191024
Implantable Lead Location: 753860
Implantable Lead Model: 5076
Implantable Lead Model: 5076
Implantable Pulse Generator Implant Date: 20191024
Lead Channel Impedance Value: 323 Ohm
Lead Channel Impedance Value: 399 Ohm
Lead Channel Impedance Value: 475 Ohm
Lead Channel Pacing Threshold Amplitude: 0.625 V
Lead Channel Pacing Threshold Pulse Width: 0.4 ms
Lead Channel Sensing Intrinsic Amplitude: 16.5 mV
Lead Channel Sensing Intrinsic Amplitude: 3 mV
Lead Channel Setting Pacing Amplitude: 3.5 V
MDC IDC LEAD LOCATION: 753859
MDC IDC MSMT LEADCHNL RV IMPEDANCE VALUE: 380 Ohm
MDC IDC MSMT LEADCHNL RV PACING THRESHOLD AMPLITUDE: 1.25 V
MDC IDC MSMT LEADCHNL RV PACING THRESHOLD PULSEWIDTH: 0.4 ms
MDC IDC SESS DTM: 20191106111950
MDC IDC SET LEADCHNL RV PACING AMPLITUDE: 3.5 V
MDC IDC SET LEADCHNL RV PACING PULSEWIDTH: 0.4 ms
MDC IDC SET LEADCHNL RV SENSING SENSITIVITY: 1.2 mV
MDC IDC STAT BRADY AP VP PERCENT: 0.06 %
MDC IDC STAT BRADY AP VS PERCENT: 94.21 %
MDC IDC STAT BRADY AS VP PERCENT: 0 %

## 2018-06-04 NOTE — Patient Outreach (Signed)
[name] [facility]) Care Management   06/04/2018  [name] August 24, 1933 [id number]  [name] is an 84 y.o. female  Subjective:  Direct referral from Dr. Evette Doffing to evaluate pt in home needs. Pt is resting in hospital bed without rails and Mr. Pancoast provides initial information including: Pt has lung ca with mets to liver and continues to have oral chemotherapy but is declining. Recent pacemaker placement.  Pt has hx of cerebellar degeneration. She is now unable to stand or transfer without 2 person assist. Unable to walk at all. Has rash on her buttocks and skin breakdown. She has a foley catheter. She is receiving PT. She has not received the requested [facility] lift. Pt has expressed that she wants to keep living and continuing treatment. Palliative care has not been discussed. Husband wants to care for his wife at home. Only has occasional assistance from others.  Objective:  BP 130 80, Pulse 83, Finger Oximetry: 98% Wt: 237 pounds Review of Systems  Constitutional: Negative.   HENT: Negative.   Eyes: Negative.   Respiratory: Negative.   Cardiovascular:       Post pacemaker placement.  Gastrointestinal: Positive for constipation.  Genitourinary:       Foley in place.  Musculoskeletal: Positive for myalgias.       Bruising to L ankle and foot.  Skin: Positive for rash.  Neurological: Positive for weakness.  Endo/Heme/Allergies: Bruises/bleeds easily.  Psychiatric/Behavioral: Positive for depression. The patient is nervous/anxious.     Physical Exam  Constitutional: She appears well-developed and well-nourished.  Cardiovascular: Normal rate.  Respiratory: Effort normal and breath sounds normal.  GI: Soft. There is tenderness.  R middle and lower quad tenderness.  Musculoskeletal:  Pt is bed bound/chair bound. Needs 2 person assist to transfer out of bed. Pt is also unable to turn on her own in bed.  Neurological:  Sleepy. Answers with yes/no or brief  statement.  Skin: Rash noted.  Has severe yeast infection of buttocks and perineal area. Has shear injuries to R and L buttocks.  Psychiatric:  Somnolent at present.    Encounter Medications:   Outpatient Encounter Medications as of 06/03/2018  Medication Sig  . ALPRAZolam (XANAX) 0.25 MG tablet Take 1 tablet (0.25 mg total) by mouth 2 (two) times daily as needed for anxiety. (Patient taking differently: Take 0.25 mg by mouth 2 (two) times daily. )  . amitriptyline (ELAVIL) 25 MG tablet Take 1 tablet (25 mg total) by mouth at bedtime.  Marland Kitchen amLODipine (NORVASC) 2.5 MG tablet Take 1 tablet (2.5 mg total) by mouth daily.  . capecitabine (XELODA) 500 MG tablet Take 3 Tablets (1500mg ) by mouth twice daily after a meal. Take on days 1-14 of each 28d cycle  . escitalopram (LEXAPRO) 20 MG tablet Take 1 tablet (20 mg total) by mouth at bedtime.  . fluticasone (FLONASE) 50 MCG/ACT nasal spray Place 2 sprays into both nostrils at bedtime.  . furosemide (LASIX) 20 MG tablet TAKE 1 TABLET DAILY AS NEEDED FOR FLUID RETENTION (Patient taking differently: Take 20 mg by mouth as needed for fluid or edema. TAKE 1 TABLET DAILY AS NEEDED FOR FLUID RETENTION)  . gabapentin (NEURONTIN) 400 MG capsule Take 1-2 capsules (400-800 mg total) by mouth 3 (three) times daily. (Patient taking differently: Take 800 mg by mouth 3 (three) times daily. )  . hydroxypropyl methylcellulose / hypromellose (ISOPTO TEARS / GONIOVISC) 2.5 % ophthalmic solution Place 1 drop into both eyes daily.   Marland Kitchen levothyroxine (  SYNTHROID, LEVOTHROID) 75 MCG tablet TAKE 1 TABLET DAILY BEFORE BREAKFAST (Patient taking differently: Take 75 mcg by mouth daily before breakfast. )  . lovastatin (MEVACOR) 40 MG tablet Take 1 tablet (40 mg total) by mouth at bedtime.  . Melatonin 3 MG TBDP Take 3-6 mg by mouth at bedtime as needed.  Marland Kitchen omeprazole (PRILOSEC) 40 MG capsule Take 1 capsule (40 mg total) by mouth daily.  . ondansetron (ZOFRAN) 8 MG tablet Take 1  tablet (8 mg total) by mouth every 8 (eight) hours as needed for nausea or vomiting.  . OXcarbazepine (TRILEPTAL) 150 MG tablet Take 1 tablet (150 mg total) by mouth 2 (two) times daily.  Marland Kitchen oxyCODONE-acetaminophen (PERCOCET) 5-325 MG tablet Take 1 tablet by mouth every 8 (eight) hours as needed for severe pain.  Marland Kitchen XARELTO 20 MG TABS tablet TAKE 1 TABLET DAILY WITH SUPPER   No facility-administered encounter medications on file as of 06/03/2018.     Functional Status:   In your present state of health, do you have any difficulty performing the following activities: 06/04/2018 05/23/2018  Hearing? N -  Vision? N -  Comment - -  Difficulty concentrating or making decisions? Y -  Walking or climbing stairs? - -  Comment - -  Dressing or bathing? Y -  Comment - -  Doing errands, shopping? [place] and eating ? Y -  Using the Toilet? Y -  In the past six months, have you accidently leaked urine? Y -  Do you have problems with loss of bowel control? Y -  Managing your Medications? Y -  Managing your Finances? Y -  Housekeeping or managing your Housekeeping? Y -  Some recent data might be hidden    Fall/Depression Screening:    Fall Risk  05/16/2018 05/12/2018 04/23/2018  Falls in the past year? Yes Yes Yes  Number falls in past yr: 2 or more 2 or more 2 or more  Injury with Fall? No - -  Risk Factor Category  High Fall Risk - -  Risk for fall due to : History of fall(s) - -  Follow up Falls evaluation completed;Education provided - -   PHQ 2/9 Scores 05/16/2018 05/12/2018 04/23/2018 04/04/2018 03/19/2018 03/12/2018 02/07/2018  PHQ - 2 Score 2 2 1 2  0 0 0  PHQ- 9 Score 14 14 - 8 - - -  Exception Documentation - - - - - - -    Assessment:  Severe yeast infection of buttocks and perineal area                         Skin breakdown on both buttocks due to shear vs. Pressure and gaudling from yeast                                    infection and incontinence.                           Declining health status.  Plan: Discussed plan of care with husband including continuing chemotherapy, caring for his wife at home, discussed possibility of palliative care for more support for the 2 of them. Husband is reluctant because that means that Hospice is not far off. A palliative care consultation would be beneficial for education.  Pt needs a [employer] fast streamer as  she is dependent for turning and needs 2 person assist to get out of bed safely. Husband cannot do this by himself and a Harrel Lemon would enable him to do so. They will need 2 slings.  Skin care required by [place] and daily care by husband: Wash and dry buttocks and perineal area gently bid. I have ordered Nystatin cream and Microgard powder; Apply small amount of nystatin cream to all skin lesions except broken skin from shear injury. Lightly apply antifungal powder. Skin shear injuries should be covered with appropriate skin dressing (Allyven would be good choice, change daily and prn if soiled.) Nursing to initiate skin care and education to husband.  Gave Rxs: oxycodone/apap 5/325 mg q 8 hours prn for cancer pain.                    Diflucan 150 mg tablet daily for 7 days only                    Nystatin cream apply to affected skin bid after gentle washing and drying.                    Microgard powder apply small amount to affected skin after nystatin cream is absorbed.  I will see pt in one week. Husband to call me with any concerns.  Eulah Pont. Myrtie Neither, MSN, GNP-BC Gerontological Nurse Practitioner [place] Management [phone]  This visit was completed on behalf of Dr. Assunta Found.

## 2018-06-04 NOTE — Progress Notes (Signed)
Wound check appointment. Steri-strips removed. Wound without redness or edema. Incision edges approximated, wound well healed. Normal device function. Thresholds, sensing, and impedances consistent with implant measurements. Device programmed at 3.5V for extra safety margin until 3 month visit. Histogram distribution appropriate for patient and level of activity. No mode switches or high ventricular rates noted. Patient educated about wound care, arm mobility, lifting restrictions. ROV in 3 months with implanting physician. ROV with JA 08/27/2018

## 2018-06-04 NOTE — Progress Notes (Signed)
[facility] lift and [facility] nursing care for wound care and education ordered.

## 2018-06-05 DIAGNOSIS — I10 Essential (primary) hypertension: Secondary | ICD-10-CM | POA: Diagnosis not present

## 2018-06-05 DIAGNOSIS — R001 Bradycardia, unspecified: Secondary | ICD-10-CM | POA: Diagnosis not present

## 2018-06-05 DIAGNOSIS — Z950 Presence of cardiac pacemaker: Secondary | ICD-10-CM | POA: Diagnosis not present

## 2018-06-05 DIAGNOSIS — Z48812 Encounter for surgical aftercare following surgery on the circulatory system: Secondary | ICD-10-CM | POA: Diagnosis not present

## 2018-06-05 DIAGNOSIS — I480 Paroxysmal atrial fibrillation: Secondary | ICD-10-CM | POA: Diagnosis not present

## 2018-06-05 DIAGNOSIS — I2510 Atherosclerotic heart disease of native coronary artery without angina pectoris: Secondary | ICD-10-CM | POA: Diagnosis not present

## 2018-06-09 DIAGNOSIS — I480 Paroxysmal atrial fibrillation: Secondary | ICD-10-CM | POA: Diagnosis not present

## 2018-06-09 DIAGNOSIS — Z950 Presence of cardiac pacemaker: Secondary | ICD-10-CM | POA: Diagnosis not present

## 2018-06-09 DIAGNOSIS — I10 Essential (primary) hypertension: Secondary | ICD-10-CM | POA: Diagnosis not present

## 2018-06-09 DIAGNOSIS — Z48812 Encounter for surgical aftercare following surgery on the circulatory system: Secondary | ICD-10-CM | POA: Diagnosis not present

## 2018-06-09 DIAGNOSIS — I2510 Atherosclerotic heart disease of native coronary artery without angina pectoris: Secondary | ICD-10-CM | POA: Diagnosis not present

## 2018-06-09 DIAGNOSIS — R001 Bradycardia, unspecified: Secondary | ICD-10-CM | POA: Diagnosis not present

## 2018-06-10 DIAGNOSIS — Z950 Presence of cardiac pacemaker: Secondary | ICD-10-CM | POA: Diagnosis not present

## 2018-06-10 DIAGNOSIS — I2510 Atherosclerotic heart disease of native coronary artery without angina pectoris: Secondary | ICD-10-CM | POA: Diagnosis not present

## 2018-06-10 DIAGNOSIS — Z48812 Encounter for surgical aftercare following surgery on the circulatory system: Secondary | ICD-10-CM | POA: Diagnosis not present

## 2018-06-10 DIAGNOSIS — I10 Essential (primary) hypertension: Secondary | ICD-10-CM | POA: Diagnosis not present

## 2018-06-10 DIAGNOSIS — I480 Paroxysmal atrial fibrillation: Secondary | ICD-10-CM | POA: Diagnosis not present

## 2018-06-10 DIAGNOSIS — R001 Bradycardia, unspecified: Secondary | ICD-10-CM | POA: Diagnosis not present

## 2018-06-11 ENCOUNTER — Encounter: Payer: Self-pay | Admitting: *Deleted

## 2018-06-11 ENCOUNTER — Other Ambulatory Visit: Payer: Self-pay | Admitting: *Deleted

## 2018-06-11 NOTE — Patient Outreach (Addendum)
Follow up home visit to reassess pt skin condition.  S:  Pt is awake and conversant today. Husband reports there has been great improvement in his wife's rash and skin shear injury.  O:  BP (!) 130/52   Pulse 60   Resp 16   SpO2 95%        Buttocks and perineal area, much improved, no longer firey red, it is pink and excoriated areas have healed. The skin shear injury on L buttock is integrating in.  Outpatient Encounter Medications as of 06/11/2018  Medication Sig Note  . ALPRAZolam (XANAX) 0.25 MG tablet Take 1 tablet (0.25 mg total) by mouth 2 (two) times daily as needed for anxiety. (Patient taking differently: Take 0.25 mg by mouth 2 (two) times daily. )   . amitriptyline (ELAVIL) 25 MG tablet Take 1 tablet (25 mg total) by mouth at bedtime.   Marland Kitchen amLODipine (NORVASC) 2.5 MG tablet Take 1 tablet (2.5 mg total) by mouth daily.   . capecitabine (XELODA) 500 MG tablet Take 3 Tablets (1521m) by mouth twice daily after a meal. Take on days 1-14 of each 28d cycle   . escitalopram (LEXAPRO) 20 MG tablet Take 1 tablet (20 mg total) by mouth at bedtime.   . fluticasone (FLONASE) 50 MCG/ACT nasal spray Place 2 sprays into both nostrils at bedtime.   . furosemide (LASIX) 20 MG tablet TAKE 1 TABLET DAILY AS NEEDED FOR FLUID RETENTION (Patient taking differently: Take 20 mg by mouth as needed for fluid or edema. TAKE 1 TABLET DAILY AS NEEDED FOR FLUID RETENTION)   . gabapentin (NEURONTIN) 400 MG capsule Take 1-2 capsules (400-800 mg total) by mouth 3 (three) times daily. (Patient taking differently: Take 800 mg by mouth 3 (three) times daily. )   . hydroxypropyl methylcellulose / hypromellose (ISOPTO TEARS / GONIOVISC) 2.5 % ophthalmic solution Place 1 drop into both eyes daily.    .Marland Kitchenlevothyroxine (SYNTHROID, LEVOTHROID) 75 MCG tablet TAKE 1 TABLET DAILY BEFORE BREAKFAST (Patient taking differently: Take 75 mcg by mouth daily before breakfast. )   . lovastatin (MEVACOR) 40 MG tablet Take 1 tablet (40 mg  total) by mouth at bedtime.   . Melatonin 3 MG TBDP Take 3-6 mg by mouth at bedtime as needed.   .Marland Kitchenomeprazole (PRILOSEC) 40 MG capsule Take 1 capsule (40 mg total) by mouth daily.   . ondansetron (ZOFRAN) 8 MG tablet Take 1 tablet (8 mg total) by mouth every 8 (eight) hours as needed for nausea or vomiting.   . OXcarbazepine (TRILEPTAL) 150 MG tablet Take 1 tablet (150 mg total) by mouth 2 (two) times daily.   .Marland KitchenoxyCODONE-acetaminophen (PERCOCET) 5-325 MG tablet Take 1 tablet by mouth every 8 (eight) hours as needed for severe pain. 06/05/2018: New Rx given on 06/03/18 by CDeloria Lair GNP for Cancer Pain. Sig q8 prn severe pain. #30.  .Marland KitchenXARELTO 20 MG TABS tablet TAKE 1 TABLET DAILY WITH SUPPER    No facility-administered encounter medications on file as of 06/11/2018.    A:  Skin impairment improving, rash resolving      No Advanced Directives  P:  Discussed Advanced Directives and a MOST form, the importance to share what Mrs. DGillentinewould want if certain circumstances present. She states SHE DOES NOT WANT TO BE RESUSCITATED. She would want to go to the hospital if she has a pulse and respirations but no aggressive treatment. She would want IV fluids, heart monitoring, antibiotics. No feeding tube.  We completed  a MOST form with the above decisions. Provided HCPOA and Living Will forms and pt and husband are to complete by the end of the month and provide her MDs and daughter a copy. They are to discuss her wishes with her daughter also.  I will call in a week. I will plan on seeing her monthly and would like to encourage them to have Palliative Care in.  Fall Risk  06/11/2018 06/04/2018 05/16/2018 05/12/2018 04/23/2018  Falls in the past year? 1 1 Yes Yes Yes  Number falls in past yr: - 1 2 or more 2 or more 2 or more  Injury with Fall? - 1 No - -  Risk Factor Category  - High Risk (2 or more Points) High Fall Risk - -  Risk for fall due to : - History of fall(s);Impaired  balance/gait;Impaired mobility;Medication side effect;Mental status change History of fall(s) - -  Follow up - Falls evaluation completed Falls evaluation completed;Education provided - -   Depression screen [facility] 2/9 06/04/2018 05/16/2018 05/12/2018 04/23/2018 04/04/2018  Decreased Interest _0 Down, Depressed, Hopeless _1 0 1  PHQ - 2 Score _2 Altered sleeping _3 - 1  Tired, decreased energy _4 - 3  Change in appetite _5 - 0  Feeling bad or failure about yourself  _6 - 0  Trouble concentrating _7 - 1  Moving slowly or fidgety/restless _8 - 1  Suicidal thoughts _9 - 0  PHQ-9 Score _10 - 8  Difficult doing work/chores Somewhat difficult Somewhat difficult - - Somewhat difficult  Some recent data might be hidden   [facility] CM Care Plan Problem One     Most Recent Value  Care Plan Problem One  Skin breakdown due to incontinence, yeast infection and shear injuries.  Role Documenting the Problem One  Care Management Coordinator  Care Plan for Problem One  Active  THN Long Term Goal   Pt will not develop new skin injuries due to shear, pressure, incontinence in the next 60 days.  THN Long Term Goal Start Date  06/11/18  Interventions for Problem One Long Term Goal  Previous interventions on first home visit 06/06/18: Rx diflucan po, Rx nystatin cream, Rx microgard powder. Instructed on skin care reigimen and to avoid shear when turning. Reassessed today with good progress. Skin is pink instead of firey red. Open skin shear wound in granulating in. Reinforced avoiding shear and assisted husband to place [facility] lift sling for pt comfort and utization for toileting.  THN CM Short Term Goal #1   Husband to provide on going skin care regimen to clear current yeast rash and skin shear injury so that this is healed  in 10 days.  THN CM Short Term Goal #1 Start Date  06/11/18  Interventions for Short Term Goal #1  Same as for long term goal but to heal current skin  impairment.    [facility] CM Care Plan Problem Two     Most Recent Value  Care Plan Problem Two  No Advanced Directives  Role Documenting the Problem Two  Care Management Coordinator  Care Plan for Problem Two  Active  THN CM Short Term Goal #1   Pt will complete MOST form today.  THN CM Short Term Goal #1 Start Date  06/11/18  THN CM Short Term Goal #1 Met Date  06/11/18  Interventions for Short Term Goal #2   Provided education and discussed pt's goals of care. All of [place] pt, husband and myself participated in this conversation.    THN CM Care Plan Problem Three     Most Recent Value  Care Plan Problem Three  Pt does not have HCPOA or living will.  Role Documenting the Problem Three  Care Management Coordinator  Care Plan for Problem Three  Active  THN CM Short Term Goal #1   Pt will complete documents with husband's assistance and husband to take pt by notary office for completion of forms when she has to go to her next MD appt.  THN CM Short Term Goal #1 Start Date  06/11/18  Interventions for Short Term Goal #1  Provided forms and instructions for completion, request copies to go to MDs and to daughter.     Eulah Pont. Myrtie Neither, MSN, [facility] Gerontological Nurse Practitioner [facility] Care Management [phone]

## 2018-06-12 ENCOUNTER — Encounter: Payer: Self-pay | Admitting: *Deleted

## 2018-06-12 DIAGNOSIS — Z48812 Encounter for surgical aftercare following surgery on the circulatory system: Secondary | ICD-10-CM | POA: Diagnosis not present

## 2018-06-12 DIAGNOSIS — I480 Paroxysmal atrial fibrillation: Secondary | ICD-10-CM | POA: Diagnosis not present

## 2018-06-12 DIAGNOSIS — I10 Essential (primary) hypertension: Secondary | ICD-10-CM | POA: Diagnosis not present

## 2018-06-12 DIAGNOSIS — Z950 Presence of cardiac pacemaker: Secondary | ICD-10-CM | POA: Diagnosis not present

## 2018-06-12 DIAGNOSIS — R001 Bradycardia, unspecified: Secondary | ICD-10-CM | POA: Diagnosis not present

## 2018-06-12 DIAGNOSIS — I2510 Atherosclerotic heart disease of native coronary artery without angina pectoris: Secondary | ICD-10-CM | POA: Diagnosis not present

## 2018-06-13 ENCOUNTER — Ambulatory Visit: Payer: Self-pay | Admitting: *Deleted

## 2018-06-13 DIAGNOSIS — I10 Essential (primary) hypertension: Secondary | ICD-10-CM | POA: Diagnosis not present

## 2018-06-13 DIAGNOSIS — Z48812 Encounter for surgical aftercare following surgery on the circulatory system: Secondary | ICD-10-CM | POA: Diagnosis not present

## 2018-06-13 DIAGNOSIS — I480 Paroxysmal atrial fibrillation: Secondary | ICD-10-CM | POA: Diagnosis not present

## 2018-06-13 DIAGNOSIS — R001 Bradycardia, unspecified: Secondary | ICD-10-CM | POA: Diagnosis not present

## 2018-06-13 DIAGNOSIS — I2510 Atherosclerotic heart disease of native coronary artery without angina pectoris: Secondary | ICD-10-CM | POA: Diagnosis not present

## 2018-06-13 DIAGNOSIS — Z950 Presence of cardiac pacemaker: Secondary | ICD-10-CM | POA: Diagnosis not present

## 2018-06-17 ENCOUNTER — Other Ambulatory Visit: Payer: Self-pay | Admitting: Pediatrics

## 2018-06-17 ENCOUNTER — Other Ambulatory Visit: Payer: Medicare Other

## 2018-06-17 ENCOUNTER — Other Ambulatory Visit: Payer: Self-pay | Admitting: *Deleted

## 2018-06-17 DIAGNOSIS — R399 Unspecified symptoms and signs involving the genitourinary system: Secondary | ICD-10-CM

## 2018-06-17 DIAGNOSIS — I480 Paroxysmal atrial fibrillation: Secondary | ICD-10-CM | POA: Diagnosis not present

## 2018-06-17 DIAGNOSIS — R319 Hematuria, unspecified: Secondary | ICD-10-CM

## 2018-06-17 DIAGNOSIS — Z48812 Encounter for surgical aftercare following surgery on the circulatory system: Secondary | ICD-10-CM | POA: Diagnosis not present

## 2018-06-17 DIAGNOSIS — R001 Bradycardia, unspecified: Secondary | ICD-10-CM | POA: Diagnosis not present

## 2018-06-17 DIAGNOSIS — Z950 Presence of cardiac pacemaker: Secondary | ICD-10-CM | POA: Diagnosis not present

## 2018-06-17 DIAGNOSIS — I10 Essential (primary) hypertension: Secondary | ICD-10-CM | POA: Diagnosis not present

## 2018-06-17 DIAGNOSIS — I2510 Atherosclerotic heart disease of native coronary artery without angina pectoris: Secondary | ICD-10-CM | POA: Diagnosis not present

## 2018-06-17 LAB — URINALYSIS
Bilirubin, UA: NEGATIVE
Glucose, UA: NEGATIVE
KETONES UA: NEGATIVE
NITRITE UA: NEGATIVE
Specific Gravity, UA: 1.015 (ref 1.005–1.030)
Urobilinogen, Ur: 1 mg/dL (ref 0.2–1.0)
pH, UA: 8.5 — ABNORMAL HIGH (ref 5.0–7.5)

## 2018-06-17 NOTE — Progress Notes (Signed)
Received message from nurse practitioner.  Had Foley exchange today.  Some hematuria following it.   Would like BMP drawn.  Has been brought by urine for UA culture. Has appointment tomorrow with oncology.   Orders in.

## 2018-06-17 NOTE — Patient Outreach (Addendum)
[facility]) Care Management  06/17/2018  [name] 05-13-1934 [id number]   ACUTE HOME VISIT FOR HEMATURIA  S:  [name] called yesterday reporting that there iis some blood in [name]. Today, [name] reports she is not in pain, she does not have a fever.  Mr. Batley reports improved skin condition in general but 3 new skin tears on her buttocks. Her reports these are due to the [facility] sling which stretches her skin around the perineal area. He has not utilized the sling since he noticed this. He also reports [name] was able to transfer to the [facility] with his total assistance and she was able to follow instructions to help in the process. They have completed her HCPOA and Living Will and MOST form.  O:  BP 130/70   Pulse 78   Resp 20   SpO2 96%        RRR        Lungs are clear       Skin - Buttocks and perineal area much improved, resolving yeast infection and healing excoriation.                 3 new skin separations on L buttock.        GU: Foley inplace but pt output is very dark amber with some clotted blood.   A:  Hematuria       Cerebellar degeneration       Skin impairment  P:  Collected urine specimen from present cath.       Removed cath. Cleansed pt's perineal area well.       Inserted 16 french foley cath with immediate amber urine that does have some bloody steaking evident.       Educated pt husband regarding daily perineal care. May use squirt bottle to remove debris in skin              folds.Avoid tugging on catheter in providing personal care.       Husband able to take urine specimen to primary care MD office. Would also like to add a BMET to the              oncologists usual lab orders to check pt renal function Wrote RX for UA and culture and add BMET to                  request this lab to be drawn at oncology.       Suggested if they use the [facility] with sling and it is not to move to the [facility], place a towel over  opening so             that she does not have the skin problem.       PRAISED from completing the HCPOA and Living Will, Instructed to give copy to Dr. Evette Doffing.             All familly members have been informed of her goals of care. (She is a DNR, and wanting minimal              interventions if hospitalization is required.        I will be available prn and will call Friday to check on pt status.  Eulah Pont. Myrtie Neither, MSN, [facility] Gerontological Nurse Practitioner [facility] Care Management [phone]

## 2018-06-18 ENCOUNTER — Other Ambulatory Visit: Payer: Self-pay | Admitting: *Deleted

## 2018-06-18 DIAGNOSIS — C7A8 Other malignant neuroendocrine tumors: Secondary | ICD-10-CM

## 2018-06-18 DIAGNOSIS — Z950 Presence of cardiac pacemaker: Secondary | ICD-10-CM | POA: Diagnosis not present

## 2018-06-18 DIAGNOSIS — I480 Paroxysmal atrial fibrillation: Secondary | ICD-10-CM | POA: Diagnosis not present

## 2018-06-18 DIAGNOSIS — Z48812 Encounter for surgical aftercare following surgery on the circulatory system: Secondary | ICD-10-CM | POA: Diagnosis not present

## 2018-06-18 DIAGNOSIS — I2510 Atherosclerotic heart disease of native coronary artery without angina pectoris: Secondary | ICD-10-CM | POA: Diagnosis not present

## 2018-06-18 DIAGNOSIS — R001 Bradycardia, unspecified: Secondary | ICD-10-CM | POA: Diagnosis not present

## 2018-06-18 DIAGNOSIS — I10 Essential (primary) hypertension: Secondary | ICD-10-CM | POA: Diagnosis not present

## 2018-06-18 LAB — URINALYSIS, MICROSCOPIC ONLY
CASTS: NONE SEEN /LPF
Epithelial Cells (non renal): NONE SEEN /hpf (ref 0–10)
RBC, UA: >30 /hpf — AB (ref 0–2)

## 2018-06-19 ENCOUNTER — Ambulatory Visit: Payer: Medicare Other | Admitting: Pediatrics

## 2018-06-19 ENCOUNTER — Inpatient Hospital Stay (HOSPITAL_BASED_OUTPATIENT_CLINIC_OR_DEPARTMENT_OTHER): Payer: Medicare Other | Admitting: Internal Medicine

## 2018-06-19 ENCOUNTER — Encounter: Payer: Self-pay | Admitting: Internal Medicine

## 2018-06-19 ENCOUNTER — Telehealth: Payer: Self-pay

## 2018-06-19 ENCOUNTER — Inpatient Hospital Stay: Payer: Medicare Other | Attending: Internal Medicine

## 2018-06-19 VITALS — BP 141/66 | HR 59 | Resp 17 | Ht 64.0 in

## 2018-06-19 DIAGNOSIS — I480 Paroxysmal atrial fibrillation: Secondary | ICD-10-CM | POA: Diagnosis not present

## 2018-06-19 DIAGNOSIS — C7B02 Secondary carcinoid tumors of liver: Secondary | ICD-10-CM | POA: Diagnosis not present

## 2018-06-19 DIAGNOSIS — C7B09 Secondary carcinoid tumors of other sites: Secondary | ICD-10-CM | POA: Diagnosis not present

## 2018-06-19 DIAGNOSIS — C7A8 Other malignant neuroendocrine tumors: Secondary | ICD-10-CM

## 2018-06-19 DIAGNOSIS — R410 Disorientation, unspecified: Secondary | ICD-10-CM

## 2018-06-19 DIAGNOSIS — Z8673 Personal history of transient ischemic attack (TIA), and cerebral infarction without residual deficits: Secondary | ICD-10-CM

## 2018-06-19 DIAGNOSIS — I10 Essential (primary) hypertension: Secondary | ICD-10-CM | POA: Diagnosis not present

## 2018-06-19 DIAGNOSIS — R001 Bradycardia, unspecified: Secondary | ICD-10-CM | POA: Diagnosis not present

## 2018-06-19 DIAGNOSIS — C3490 Malignant neoplasm of unspecified part of unspecified bronchus or lung: Secondary | ICD-10-CM

## 2018-06-19 DIAGNOSIS — Z48812 Encounter for surgical aftercare following surgery on the circulatory system: Secondary | ICD-10-CM | POA: Diagnosis not present

## 2018-06-19 DIAGNOSIS — Z79899 Other long term (current) drug therapy: Secondary | ICD-10-CM | POA: Diagnosis not present

## 2018-06-19 DIAGNOSIS — C7B8 Other secondary neuroendocrine tumors: Secondary | ICD-10-CM

## 2018-06-19 DIAGNOSIS — C787 Secondary malignant neoplasm of liver and intrahepatic bile duct: Secondary | ICD-10-CM

## 2018-06-19 DIAGNOSIS — I2510 Atherosclerotic heart disease of native coronary artery without angina pectoris: Secondary | ICD-10-CM | POA: Diagnosis not present

## 2018-06-19 DIAGNOSIS — Z950 Presence of cardiac pacemaker: Secondary | ICD-10-CM | POA: Diagnosis not present

## 2018-06-19 LAB — CBC WITH DIFFERENTIAL (CANCER CENTER ONLY)
Abs Immature Granulocytes: 0.02 10*3/uL (ref 0.00–0.07)
BASOS ABS: 0 10*3/uL (ref 0.0–0.1)
BASOS PCT: 1 %
EOS ABS: 0.3 10*3/uL (ref 0.0–0.5)
Eosinophils Relative: 9 %
HEMATOCRIT: 36.6 % (ref 36.0–46.0)
HEMOGLOBIN: 12 g/dL (ref 12.0–15.0)
Immature Granulocytes: 1 %
Lymphocytes Relative: 21 %
Lymphs Abs: 0.7 10*3/uL (ref 0.7–4.0)
MCH: 34.7 pg — AB (ref 26.0–34.0)
MCHC: 32.8 g/dL (ref 30.0–36.0)
MCV: 105.8 fL — AB (ref 80.0–100.0)
Monocytes Absolute: 0.5 10*3/uL (ref 0.1–1.0)
Monocytes Relative: 16 %
NEUTROS PCT: 52 %
NRBC: 0 % (ref 0.0–0.2)
Neutro Abs: 1.8 10*3/uL (ref 1.7–7.7)
Platelet Count: 98 10*3/uL — ABNORMAL LOW (ref 150–400)
RBC: 3.46 MIL/uL — AB (ref 3.87–5.11)
RDW: 16.5 % — AB (ref 11.5–15.5)
WBC: 3.4 10*3/uL — AB (ref 4.0–10.5)

## 2018-06-19 LAB — CMP (CANCER CENTER ONLY)
ALBUMIN: 3.4 g/dL — AB (ref 3.5–5.0)
ALK PHOS: 118 U/L (ref 38–126)
ALT: 17 U/L (ref 0–44)
ANION GAP: 9 (ref 5–15)
AST: 35 U/L (ref 15–41)
BILIRUBIN TOTAL: 0.5 mg/dL (ref 0.3–1.2)
BUN: 9 mg/dL (ref 8–23)
CALCIUM: 9.8 mg/dL (ref 8.9–10.3)
CO2: 25 mmol/L (ref 22–32)
Chloride: 104 mmol/L (ref 98–111)
Creatinine: 0.87 mg/dL (ref 0.44–1.00)
GFR, Est AFR Am: >60 mL/min (ref >60)
GFR, Estimated: >60 mL/min (ref >60)
GLUCOSE: 116 mg/dL — AB (ref 70–99)
POTASSIUM: 4.1 mmol/L (ref 3.5–5.1)
Sodium: 138 mmol/L (ref 135–145)
Total Protein: 6.5 g/dL (ref 6.5–8.1)

## 2018-06-19 NOTE — Telephone Encounter (Signed)
Printed avs and calender of upcoming appointment. Per 11/21 los

## 2018-06-19 NOTE — Progress Notes (Signed)
[place] [phone]   [phone]  OFFICE PROGRESS NOTE  [name], [name] [address]  DIAGNOSIS:  1) Metastatic intermediate. Neuroendocrine tumor of lung primary diagnosed in January 2018 and presented with small bilateral pulmonary nodules in addition to multiple liver metastasis. 2) right lower lobe pulmonary embolism diagnosed incidentally on CT scan of the chest on 04/23/2017  PRIOR THERAPY:  1) Status post radio embolization with Y 90 to the liver lesions by interventional radiology. 2) status post IVC filter placement by interventional radiology on 04/24/2017  CURRENT THERAPY: Xeloda 750 MG/M2 twice a day days 1-14 and Temodar 150 MG/M2 days 10-14 every 4 weeks. Status post 24 cycles. She will start cycle #25 on June 29, 2018.  INTERVAL HISTORY: [name] 84 y.o. female returns to the clinic today for follow-up visit accompanied by her husband.  The patient is feeling fine today with no specific complaints except for the generalized fatigue and weakness as well as confusion at times.  She had replacement of her pacemaker recently and her heart rate is much better.  She denied having any chest pain but has shortness of breath with exertion with no cough or hemoptysis.  She denied having any fever or chills.  She has no nausea, vomiting, diarrhea or constipation.  She continues to tolerate her treatment with Xeloda and Temodar fairly well.  She is here today for evaluation and repeat blood work.  MEDICAL HISTORY: Past Medical History:  Diagnosis Date  . Acute respiratory failure with hypoxia ([place]) 10/23/2015  . Allergic rhinitis    PT. DENIES  . Anxiety   . Aortic insufficiency    Echo 04/29/2018: EF 65-70, mild AS (mean 13), mod AI, Asc Aorta 42 mm (mildly dilated), mild LAE, PASP 41, pericardium normal in appearance.   . Arthritis    NECK  . Ataxia   . Bradycardia    primarily nocturnal  . Burning  tongue syndrome 25 years  . Cancer ([place]) dx'd 06/2016   liver  . Cataract   . Cerebellar degeneration ([place])   . Chronic urinary tract infection   . Complication of anesthesia    low o2 sats, coded 30 years ago  . CVA (cerebral infarction) 05/2003  . Depression   . Encounter for antineoplastic chemotherapy 10/09/2016  . Gait disorder   . Gastric polyps   . GERD (gastroesophageal reflux disease)   . Goals of care, counseling/discussion 10/09/2016  . High cholesterol   . History of pericarditis   . Hyperlipidemia   . Hypotension   . Hypothyroidism   . IBS (irritable bowel syndrome)   . Obesity   . Paroxysmal atrial fibrillation ([name])    chads2vasc score is 6,  she is felt to be a poor candidate for anticoagulation  . Pericarditis   . Personal history of arterial venous malformation (AVM)    right side of face  . Seizure disorder ([facility])   . Seizures ([facility] [facility]) 2003   " smelling"- Gabapentin "no problem"  . Shortness of breath dyspnea    with exertion  . Sick sinus syndrome ([facility])   . Sternum fx 10/27/2013  . Stroke [facility]) 5 years ago   Right side of face weak, slurred speach-   . Thyroid disease   . TIA (transient ischemic attack)   . UTI (lower urinary tract infection) 03/27/2016   "frequently"    ALLERGIES:  is allergic to lisinopril.  MEDICATIONS:  Current Outpatient Medications  Medication Sig Dispense Refill  . ALPRAZolam (XANAX) 0.25 MG tablet Take 1 tablet (0.25 mg total) by mouth 2 (two) times daily as needed for anxiety. (Patient taking differently: Take 0.25 mg by mouth 2 (two) times daily. ) 30 tablet 1  . amitriptyline (ELAVIL) 25 MG tablet Take 1 tablet (25 mg total) by mouth at bedtime. 90 tablet 1  . amLODipine (NORVASC) 2.5 MG tablet Take 1 tablet (2.5 mg total) by mouth daily. 30 tablet 6  . capecitabine (XELODA) 500 MG tablet Take 3 Tablets (1500mg ) by mouth twice daily after a meal. Take on days 1-14 of each 28d cycle 84 tablet 2  . escitalopram (LEXAPRO) 20 MG  tablet Take 1 tablet (20 mg total) by mouth at bedtime. 90 tablet 1  . fluticasone (FLONASE) 50 MCG/ACT nasal spray Place 2 sprays into both nostrils at bedtime. 16 g 3  . furosemide (LASIX) 20 MG tablet TAKE 1 TABLET DAILY AS NEEDED FOR FLUID RETENTION (Patient taking differently: Take 20 mg by mouth as needed for fluid or edema. TAKE 1 TABLET DAILY AS NEEDED FOR FLUID RETENTION) 90 tablet 0  . gabapentin (NEURONTIN) 400 MG capsule Take 1-2 capsules (400-800 mg total) by mouth 3 (three) times daily. (Patient taking differently: Take 800 mg by mouth 3 (three) times daily. ) 720 capsule 1  . hydroxypropyl methylcellulose / hypromellose (ISOPTO TEARS / GONIOVISC) 2.5 % ophthalmic solution Place 1 drop into both eyes daily.     Marland Kitchen levothyroxine (SYNTHROID, LEVOTHROID) 75 MCG tablet TAKE 1 TABLET DAILY BEFORE BREAKFAST (Patient taking differently: Take 75 mcg by mouth daily before breakfast. ) 90 tablet 1  . lovastatin (MEVACOR) 40 MG tablet Take 1 tablet (40 mg total) by mouth at bedtime. 90 tablet 1  . Melatonin 3 MG TBDP Take 3-6 mg by mouth at bedtime as needed. 60 tablet 1  . omeprazole (PRILOSEC) 40 MG capsule Take 1 capsule (40 mg total) by mouth daily. 90 capsule 1  . ondansetron (ZOFRAN) 8 MG tablet Take 1 tablet (8 mg total) by mouth every 8 (eight) hours as needed for nausea or vomiting. 90 tablet 1  . OXcarbazepine (TRILEPTAL) 150 MG tablet Take 1 tablet (150 mg total) by mouth 2 (two) times daily. 60 tablet 3  . oxyCODONE-acetaminophen (PERCOCET) 5-325 MG tablet Take 1 tablet by mouth every 8 (eight) hours as needed for severe pain. 10 tablet 0  . XARELTO 20 MG TABS tablet TAKE 1 TABLET DAILY WITH SUPPER 90 tablet 4   No current facility-administered medications for this visit.     SURGICAL HISTORY:  Past Surgical History:  Procedure Laterality Date  . APPENDECTOMY  84 years old  . COLONOSCOPY  2006, 2009  . COLONOSCOPY WITH PROPOFOL N/A 03/28/2016   Procedure: COLONOSCOPY WITH  PROPOFOL;  Surgeon: Gatha Mayer, MD;  Location: [facility];  Service: Endoscopy;  Laterality: N/A;  . CORONARY ANGIOGRAPHY N/A 05/22/2018   Procedure: CORONARY ANGIOGRAPHY (CATH LAB);  Surgeon: Lorretta Harp, MD;  Location: [facility];  Service: Cardiovascular;  Laterality: N/A;  . cyst removed  35 years ago  . EP IMPLANTABLE DEVICE N/A 01/06/2015   Procedure: Loop Recorder Insertion;  Surgeon: Thompson Grayer, MD;  Location: [facility] CV LAB;  Service: Cardiovascular;  Laterality: N/A;  . EYE SURGERY Right    Cataract  . IR ANGIOGRAM SELECTIVE EACH ADDITIONAL VESSEL  11/28/2016  . IR ANGIOGRAM SELECTIVE EACH ADDITIONAL VESSEL  11/28/2016  . IR ANGIOGRAM  SELECTIVE EACH ADDITIONAL VESSEL  11/28/2016  . IR ANGIOGRAM SELECTIVE EACH ADDITIONAL VESSEL  11/28/2016  . IR ANGIOGRAM SELECTIVE EACH ADDITIONAL VESSEL  12/13/2016  . IR ANGIOGRAM SELECTIVE EACH ADDITIONAL VESSEL  12/13/2016  . IR ANGIOGRAM SELECTIVE EACH ADDITIONAL VESSEL  01/09/2017  . IR ANGIOGRAM VISCERAL SELECTIVE  11/28/2016  . IR ANGIOGRAM VISCERAL SELECTIVE  11/28/2016  . IR ANGIOGRAM VISCERAL SELECTIVE  12/13/2016  . IR ANGIOGRAM VISCERAL SELECTIVE  12/13/2016  . IR ANGIOGRAM VISCERAL SELECTIVE  01/09/2017  . IR EMBO ARTERIAL NOT HEMORR HEMANG INC GUIDE ROADMAPPING  11/28/2016  . IR EMBO TUMOR ORGAN ISCHEMIA INFARCT INC GUIDE ROADMAPPING  12/13/2016  . IR EMBO TUMOR ORGAN ISCHEMIA INFARCT INC GUIDE ROADMAPPING  01/09/2017  . IR IVC FILTER PLMT / S&I /IMG GUID/MOD SED  04/24/2017  . IR RADIOLOGIST EVAL & MGMT  11/06/2016  . IR RADIOLOGIST EVAL & MGMT  01/02/2017  . IR RADIOLOGIST EVAL & MGMT  02/05/2017  . IR RADIOLOGIST EVAL & MGMT  05/02/2017  . IR RADIOLOGIST EVAL & MGMT  10/17/2017  . IR US GUIDE VASC ACCESS RIGHT  11/28/2016  . IR US GUIDE VASC ACCESS RIGHT  12/13/2016  . IR US GUIDE VASC ACCESS RIGHT  01/09/2017  . KNEE ARTHROSCOPY Right 11/14/2006  . KNEE ARTHROSCOPY Bilateral 5 and 6 years ago  . KNEE ARTHROSCOPY WITH LATERAL  MENISECTOMY  07/03/2012   Procedure: KNEE ARTHROSCOPY WITH LATERAL MENISECTOMY;  Surgeon: Magnus Sinning, MD;  Location: [facility];  Service: Orthopedics;  Laterality: Left;  with Partial Lateral Menisectomy and Medial Menisectomy. Shaving of medial and lateral femoral condyles. Shaving of patella. Removal of a loose body  . LOOP RECORDER REMOVAL N/A 05/22/2018   Procedure: LOOP RECORDER REMOVAL;  Surgeon: Thompson Grayer, MD;  Location: [facility] CV LAB;  Service: Cardiovascular;  Laterality: N/A;  . PACEMAKER IMPLANT N/A 05/22/2018   Procedure: PACEMAKER IMPLANT;  Surgeon: Thompson Grayer, MD;  Location: [facility];  Service: Cardiovascular;  Laterality: N/A;  . tibial and fibular internal fixation Left   . TOTAL ABDOMINAL HYSTERECTOMY  84 years old  . UPPER GASTROINTESTINAL ENDOSCOPY  2009, 2013    REVIEW OF SYSTEMS:  A comprehensive review of systems was negative except for: Constitutional: positive for fatigue Neurological: positive for Confusion   PHYSICAL EXAMINATION: General appearance: alert, cooperative, fatigued and no distress Head: Normocephalic, without obvious abnormality, atraumatic Neck: no adenopathy, no JVD, supple, symmetrical, trachea midline and thyroid not enlarged, symmetric, no tenderness/mass/nodules Lymph nodes: Cervical, supraclavicular, and axillary nodes normal. Resp: clear to auscultation bilaterally Back: symmetric, no curvature. ROM normal. No CVA tenderness. Cardio: regular rate and rhythm, S1, S2 normal, no murmur, click, rub or gallop GI: soft, non-tender; bowel sounds normal; no masses,  no organomegaly Extremities: extremities normal, atraumatic, no cyanosis or edema  ECOG PERFORMANCE STATUS: 1 - Symptomatic but completely ambulatory  Blood pressure (!) 141/66, pulse (!) 59, resp. rate 17, height 5\' 4"  (1.626 m), SpO2 100 %.  LABORATORY DATA: Lab Results  Component Value Date   WBC 3.4 (L) 06/19/2018   HGB 12.0 06/19/2018   HCT 36.6  06/19/2018   MCV 105.8 (H) 06/19/2018   PLT 98 (L) 06/19/2018      Chemistry      Component Value Date/Time   NA 132 (L) 05/23/2018 0344   NA 134 05/17/2018 1151   NA 135 (L) 07/10/2017 1142   K 4.0 05/23/2018 0344   K 4.6 07/10/2017 1142   CL 103  05/23/2018 0344   CO2 21 (L) 05/23/2018 0344   CO2 25 07/10/2017 1142   BUN 11 05/23/2018 0344   BUN 10 05/17/2018 1151   BUN 11.4 07/10/2017 1142   CREATININE 0.80 05/23/2018 0344   CREATININE 0.88 04/18/2018 1525   CREATININE 1.0 07/10/2017 1142      Component Value Date/Time   CALCIUM 9.1 05/23/2018 0344   CALCIUM 9.9 07/10/2017 1142   ALKPHOS 86 05/16/2018 1515   ALKPHOS 93 07/10/2017 1142   AST 31 05/16/2018 1515   AST 24 04/18/2018 1525   AST 43 (H) 07/10/2017 1142   ALT 14 05/16/2018 1515   ALT 13 04/18/2018 1525   ALT 28 07/10/2017 1142   BILITOT 0.6 05/16/2018 1515   BILITOT 0.7 04/18/2018 1525   BILITOT 0.56 07/10/2017 1142       RADIOGRAPHIC STUDIES: Dg Chest 2 View  Result Date: 05/23/2018 CLINICAL DATA:  Postop pacemaker placement EXAM: CHEST - 2 VIEW COMPARISON:  05/04/2018 FINDINGS: There is mild bilateral interstitial thickening. There is no focal parenchymal opacity. There is no pleural effusion or pneumothorax. There is stable cardiomegaly. There is a dual lead cardiac pacemaker in satisfactory position. There is thoracic aortic atherosclerosis. The osseous structures are unremarkable. IMPRESSION: 1. Interval placement of a dual lead cardiac pacemaker in satisfactory position. 2. Cardiomegaly with mild pulmonary vascular congestion. Electronically Signed   By: Kathreen Devoid   On: 05/23/2018 07:55    ASSESSMENT AND PLAN:  This is a very pleasant 84 years old white female with metastatic intermediate grade neuroendocrine carcinoma of questionable lung primary and multiple metastatic liver lesions and pancreatic lesions.She status post treatment with radio embolization with Y90 to the left and right lobe liver  lesions.Status post treatment with Y 90 to the left and right lobes of the liver. She is currently undergoing systemic chemotherapy with Xeloda and Temodar status post 24 cycles. The patient is feeling fine today with no concerning complaints except for fatigue and confusions at time. She continues to tolerate her treatment well with no concerning complaints. I recommended for the patient to continue her current treatment with Xeloda and Temodar and she will start cycle #25 on June 29, 2018. I will see her back for follow-up visit in 1 month for evaluation after repeating CT scan of the chest, abdomen and pelvis for restaging of her disease.  We will also order CT scan of the head for evaluation of her confusion. The patient was advised to call immediately if she has any concerning symptoms in the interval. The patient voices understanding of current disease status and treatment options and is in agreement with the current care plan. All questions were answered. The patient knows to call the clinic with any problems, questions or concerns. We can certainly see the patient much sooner if necessary.  Disclaimer: This note was dictated with voice recognition software. Similar sounding words can inadvertently be transcribed and may not be corrected upon review.

## 2018-06-20 ENCOUNTER — Other Ambulatory Visit: Payer: Self-pay | Admitting: Pediatrics

## 2018-06-20 ENCOUNTER — Other Ambulatory Visit: Payer: Self-pay | Admitting: *Deleted

## 2018-06-20 DIAGNOSIS — N390 Urinary tract infection, site not specified: Secondary | ICD-10-CM

## 2018-06-20 DIAGNOSIS — T83511S Infection and inflammatory reaction due to indwelling urethral catheter, sequela: Principal | ICD-10-CM

## 2018-06-20 MED ORDER — CEPHALEXIN 500 MG PO CAPS: 500.0000 mg | ORAL_CAPSULE | Freq: Two times a day (BID) | ORAL | 0 refills | Status: DC

## 2018-06-20 NOTE — Patient Outreach (Signed)
Telephone call to follow up on pt urinary condition. Mr. Longshore reports pt urine is 100% better. Urine is clear and light yellow. Mrs. Borgerding has no systemic signs of infection. Her culture did grow out gram + rods and Dr. Evette Doffing called in an RX for Keflex.  Pt went to her oncologiiist yesterday. No changes in her regimen. She did have labs done.  Mr. Goold reports transferring pt by pivot method onto the [facility] and she seems to be well seated. He turned for just a moment and she fell on the floor bumping her head an raising a big bump and her forehead. She did not lose consciousness. He used the [place] lift to get her off the floor and back into bed.  I have advised Mr. Ayuso to watch her carefully over the next 24 hours and if she has ongoing HA or any changes in her LOC, she may need to go to the hospital for a scan to ensure she does not have an intercranial bleed or hemorrhage.  Encouraged him to call me if needed, otherwise I will call him next week.Kayleen Memos C. Myrtie Neither, MSN, [facility] Gerontological Nurse Practitioner [facility] [phone]

## 2018-06-21 LAB — URINE CULTURE

## 2018-06-23 ENCOUNTER — Other Ambulatory Visit: Payer: Self-pay | Admitting: Pediatrics

## 2018-06-23 DIAGNOSIS — Z950 Presence of cardiac pacemaker: Secondary | ICD-10-CM | POA: Diagnosis not present

## 2018-06-23 DIAGNOSIS — Z48812 Encounter for surgical aftercare following surgery on the circulatory system: Secondary | ICD-10-CM | POA: Diagnosis not present

## 2018-06-23 DIAGNOSIS — I2510 Atherosclerotic heart disease of native coronary artery without angina pectoris: Secondary | ICD-10-CM | POA: Diagnosis not present

## 2018-06-23 DIAGNOSIS — N390 Urinary tract infection, site not specified: Secondary | ICD-10-CM

## 2018-06-23 DIAGNOSIS — T83511A Infection and inflammatory reaction due to indwelling urethral catheter, initial encounter: Principal | ICD-10-CM

## 2018-06-23 DIAGNOSIS — I480 Paroxysmal atrial fibrillation: Secondary | ICD-10-CM | POA: Diagnosis not present

## 2018-06-23 DIAGNOSIS — R001 Bradycardia, unspecified: Secondary | ICD-10-CM | POA: Diagnosis not present

## 2018-06-23 DIAGNOSIS — I10 Essential (primary) hypertension: Secondary | ICD-10-CM | POA: Diagnosis not present

## 2018-06-23 MED ORDER — LEVOFLOXACIN 250 MG PO TABS: 250.0000 mg | ORAL_TABLET | Freq: Every day | ORAL | 0 refills | Status: DC

## 2018-06-24 ENCOUNTER — Encounter: Payer: Self-pay | Admitting: Cardiovascular Disease

## 2018-06-24 ENCOUNTER — Ambulatory Visit (INDEPENDENT_AMBULATORY_CARE_PROVIDER_SITE_OTHER): Payer: Medicare Other | Admitting: Cardiovascular Disease

## 2018-06-24 DIAGNOSIS — I208 Other forms of angina pectoris: Secondary | ICD-10-CM | POA: Diagnosis not present

## 2018-06-24 DIAGNOSIS — R001 Bradycardia, unspecified: Secondary | ICD-10-CM | POA: Diagnosis not present

## 2018-06-24 DIAGNOSIS — I480 Paroxysmal atrial fibrillation: Secondary | ICD-10-CM | POA: Diagnosis not present

## 2018-06-24 DIAGNOSIS — Z48812 Encounter for surgical aftercare following surgery on the circulatory system: Secondary | ICD-10-CM | POA: Diagnosis not present

## 2018-06-24 DIAGNOSIS — Z950 Presence of cardiac pacemaker: Secondary | ICD-10-CM | POA: Diagnosis not present

## 2018-06-24 DIAGNOSIS — I2510 Atherosclerotic heart disease of native coronary artery without angina pectoris: Secondary | ICD-10-CM | POA: Diagnosis not present

## 2018-06-24 DIAGNOSIS — I10 Essential (primary) hypertension: Secondary | ICD-10-CM | POA: Diagnosis not present

## 2018-06-24 MED FILL — CAPECITABINE 500 MG TABS: 500 | 14 days supply | Qty: 84 | Fill #2

## 2018-06-24 MED FILL — TEMOZOLOMIDE 140 MG CAPS: 140 | 28 days supply | Qty: 10 | Fill #2

## 2018-06-24 NOTE — Assessment & Plan Note (Signed)
Ms. [name] had a permanent transvenous pacemaker inserted by Dr. Rayann Heman because of symptomatic bradycardia and PAF 05/22/2018 after I performed cardiac catheterization that morning revealing no significant CAD.  She is had no further "spells".  She feels clinically improved.  Patient site is healing well.  She is on Xarelto.

## 2018-06-24 NOTE — Patient Instructions (Signed)
Medication Instructions:  NONE If you need a refill on your cardiac medications before your next appointment, please call your pharmacy.   Lab work: NONE If you have labs (blood work) drawn today and your tests are completely normal, you will receive your results only by: Marland Kitchen MyChart Message (if you have MyChart) OR . A paper copy in the mail If you have any lab test that is abnormal or we need to change your treatment, we will call you to review the results.  Testing/Procedures: NONE  Follow-Up: Your physician recommends that you schedule a follow-up appointment in: 6 MONTHS WITH AN APP AND 12 MONTHS WITH DR. Gwenlyn Found. PLEASE CALL 2 MONTHS PRIOR TO YOUR APPOINTMENTS TO SCHEDULE

## 2018-06-24 NOTE — Progress Notes (Signed)
06/24/2018 [name]   11-16-1933  [id number]  Primary Physician Eustaquio Maize, MD Primary Cardiologist: Lorretta Harp MD Garret Reddish, [place], [place]  HPI:  [name] is a 84 y.o.  moderately overweight married Caucasian female mother of 32 (son 39 years old died 77/2 years ago prostate CA), grandmother of 2 grandchildren, who was formally a patient of Dr. Delfino Lovett Lowella Fairy. I last saw her in the office  05/21/2018. She has no prior cardiac history. I last saw her in the office 11/10/14. She does have a history of treated hypertension and hyperlipidemia as well as a family history of heart disease. Her father died at age 27 from a heart attack as did her brother. She has another brother who is in open-heart surgery. She has never had a heart attack but has had "mini strokes" in the past. She does get dyspnea on exertion but denies chest pain. I left cell her one year ago. She apparently had Ace sternal fracture secondary to a fall 6 months ago and had a chest CT that showed abnormalities in her lung parenchyma which has been followed by serial CT scans. She was recently admitted to, hospital for chest pain rule out MI. CT angiogram was negative for PE. A Myoview stress test was negative as well. She complains of episodes of weakness as well as palpitations. She does have blood pressure and heart rate readings with some relative bradycardia. Because of palpitations and weakness and performed a 30 day event monitor which showed predominantly sinus rhythm with episodes of sinus bradycardia, pauses up to 2 seconds and a short run of paroxysmal fibrillation with a rapid ventricular response. She is not a good oral anticoagulation patient because of frequent falls and AVMs.  She had a loop recorder implanted by Dr. Rayann Heman which has shown some brief episodes of PAF but no arrhythmias that would be contributing to syncope. She is on Xarelto oral anticoagulation.  Apparently her loop  recorder has shown bradycardia which may be contributory to her symptoms.  Dr. Rayann Heman is planning on removing her loop recorder tomorrow and placing a permanent transvenous pacemaker.  Because of her ongoing chest pain without prior cardiac catheterization it was decided to proceed with outpatient radial diagnostic cath in the morning to define her coronary anatomy prior to pacemaker insertion.  I performed outpatient radial diagnostic cath 05/22/2018 revealing minimal CAD, normal LV function.  She then underwent permanent transvenous pacemaker insertion by Dr. Rayann Heman the following day because of symptomatic bradycardia with Medtronic XT DR  MRI SureScan model.    Current Meds  Medication Sig  . ALPRAZolam (XANAX) 0.25 MG tablet Take 1 tablet (0.25 mg total) by mouth 2 (two) times daily as needed for anxiety. (Patient taking differently: Take 0.25 mg by mouth 2 (two) times daily. )  . amitriptyline (ELAVIL) 25 MG tablet Take 1 tablet (25 mg total) by mouth at bedtime.  Marland Kitchen amLODipine (NORVASC) 2.5 MG tablet Take 1 tablet (2.5 mg total) by mouth daily.  . capecitabine (XELODA) 500 MG tablet Take 3 Tablets (1500mg ) by mouth twice daily after a meal. Take on days 1-14 of each 28d cycle  . escitalopram (LEXAPRO) 20 MG tablet Take 1 tablet (20 mg total) by mouth at bedtime.  . fluticasone (FLONASE) 50 MCG/ACT nasal spray Place 2 sprays into both nostrils at bedtime.  . furosemide (LASIX) 20 MG tablet TAKE 1 TABLET DAILY AS NEEDED FOR FLUID RETENTION (Patient taking differently: Take 20 mg  by mouth as needed for fluid or edema. TAKE 1 TABLET DAILY AS NEEDED FOR FLUID RETENTION)  . gabapentin (NEURONTIN) 400 MG capsule Take 1-2 capsules (400-800 mg total) by mouth 3 (three) times daily. (Patient taking differently: Take 800 mg by mouth 3 (three) times daily. )  . hydroxypropyl methylcellulose / hypromellose (ISOPTO TEARS / GONIOVISC) 2.5 % ophthalmic solution Place 1 drop into both eyes daily.   Marland Kitchen  levofloxacin (LEVAQUIN) 250 MG tablet Take 1 tablet (250 mg total) by mouth daily.  Marland Kitchen levothyroxine (SYNTHROID, LEVOTHROID) 75 MCG tablet TAKE 1 TABLET DAILY BEFORE BREAKFAST (Patient taking differently: Take 75 mcg by mouth daily before breakfast. )  . lovastatin (MEVACOR) 40 MG tablet Take 1 tablet (40 mg total) by mouth at bedtime.  . Melatonin 3 MG TBDP Take 3-6 mg by mouth at bedtime as needed.  Marland Kitchen omeprazole (PRILOSEC) 40 MG capsule Take 1 capsule (40 mg total) by mouth daily.  . ondansetron (ZOFRAN) 8 MG tablet Take 1 tablet (8 mg total) by mouth every 8 (eight) hours as needed for nausea or vomiting.  . OXcarbazepine (TRILEPTAL) 150 MG tablet Take 1 tablet (150 mg total) by mouth 2 (two) times daily.  Marland Kitchen oxyCODONE-acetaminophen (PERCOCET) 5-325 MG tablet Take 1 tablet by mouth every 8 (eight) hours as needed for severe pain.  Marland Kitchen temozolomide (TEMODAR) 140 MG capsule Take 2 capsules by mouth as directed.  Alveda Reasons 20 MG TABS tablet TAKE 1 TABLET DAILY WITH SUPPER     Allergies  Allergen Reactions  . Lisinopril Cough    Social History   Socioeconomic History  . Marital status: Married    Spouse name: Muranda Coye  . Number of children: 6  . Years of education: Not on file  . Highest education level: 10th grade  Occupational History  . Occupation: Agricultural engineer  Social Needs  . Financial resource strain: Not very hard  . Food insecurity:    Worry: Never true    Inability: Never true  . Transportation needs:    Medical: No    Non-medical: No  Tobacco Use  . Smoking status: Former Smoker    Packs/day: 0.25    Years: 10.00    Pack years: 2.50    Types: Cigarettes    Last attempt to quit: 07/31/1975    Years since quitting: 42.9  . Smokeless tobacco: Never Used  Substance and Sexual Activity  . Alcohol use: No    Comment: Rare- maybe a drink ever 2 years  . Drug use: No  . Sexual activity: Not Currently  Lifestyle  . Physical activity:    Days per week: 0 days    Minutes  per session: 0 min  . Stress: Not at all  Relationships  . Social connections:    Talks on phone: More than three times a week    Gets together: More than three times a week    Attends religious service: Never    Active member of club or organization: No    Attends meetings of clubs or organizations: Never    Relationship status: Married  . Intimate partner violence:    Fear of current or ex partner: No    Emotionally abused: No    Physically abused: No    Forced sexual activity: No  Other Topics Concern  . Not on file  Social History Narrative   Lives with husband, does have stairs, does not use them. Pt completed 10th grade.     Review of Systems:  General: negative for chills, fever, night sweats or weight changes.  Cardiovascular: negative for chest pain, dyspnea on exertion, edema, orthopnea, palpitations, paroxysmal nocturnal dyspnea or shortness of breath Dermatological: negative for rash Respiratory: negative for cough or wheezing Urologic: negative for hematuria Abdominal: negative for nausea, vomiting, diarrhea, bright red blood per rectum, melena, or hematemesis Neurologic: negative for visual changes, syncope, or dizziness All other systems reviewed and are otherwise negative except as noted above.    Blood pressure (!) 141/66, pulse 71, height 5\' 4"  (1.626 m), weight 224 lb (101.6 kg), SpO2 96 %.  General appearance: alert and no distress Neck: no adenopathy, no carotid bruit, no JVD, supple, symmetrical, trachea midline and thyroid not enlarged, symmetric, no tenderness/mass/nodules Lungs: clear to auscultation bilaterally Heart: regular rate and rhythm, S1, S2 normal, no murmur, click, rub or gallop Extremities: extremities normal, atraumatic, no cyanosis or edema Pulses: 2+ and symmetric Skin: Skin color, texture, turgor normal. No rashes or lesions Neurologic: Alert and oriented X 3, normal strength and tone. Normal symmetric reflexes. Normal coordination and  gait  EKG not performed today  ASSESSMENT AND PLAN:   Paroxysmal atrial fibrillation ([place]) Ms. Hinchcliff had a permanent transvenous pacemaker inserted by Dr. Rayann Heman because of symptomatic bradycardia and PAF 05/22/2018 after I performed cardiac catheterization that morning revealing no significant CAD.  She is had no further "spells".  She feels clinically improved.  Patient site is healing well.  She is on Xarelto.      Lorretta Harp MD FACP,FACC,FAHA, [facility] 06/24/2018 2:37 PM

## 2018-06-25 DIAGNOSIS — I10 Essential (primary) hypertension: Secondary | ICD-10-CM | POA: Diagnosis not present

## 2018-06-25 DIAGNOSIS — Z950 Presence of cardiac pacemaker: Secondary | ICD-10-CM | POA: Diagnosis not present

## 2018-06-25 DIAGNOSIS — Z48812 Encounter for surgical aftercare following surgery on the circulatory system: Secondary | ICD-10-CM | POA: Diagnosis not present

## 2018-06-25 DIAGNOSIS — I480 Paroxysmal atrial fibrillation: Secondary | ICD-10-CM | POA: Diagnosis not present

## 2018-06-25 DIAGNOSIS — I2510 Atherosclerotic heart disease of native coronary artery without angina pectoris: Secondary | ICD-10-CM | POA: Diagnosis not present

## 2018-06-25 DIAGNOSIS — R001 Bradycardia, unspecified: Secondary | ICD-10-CM | POA: Diagnosis not present

## 2018-06-27 ENCOUNTER — Other Ambulatory Visit: Payer: Self-pay | Admitting: *Deleted

## 2018-06-27 DIAGNOSIS — Z950 Presence of cardiac pacemaker: Secondary | ICD-10-CM | POA: Diagnosis not present

## 2018-06-27 DIAGNOSIS — I10 Essential (primary) hypertension: Secondary | ICD-10-CM | POA: Diagnosis not present

## 2018-06-27 DIAGNOSIS — Z48812 Encounter for surgical aftercare following surgery on the circulatory system: Secondary | ICD-10-CM | POA: Diagnosis not present

## 2018-06-27 DIAGNOSIS — I2510 Atherosclerotic heart disease of native coronary artery without angina pectoris: Secondary | ICD-10-CM | POA: Diagnosis not present

## 2018-06-27 DIAGNOSIS — I480 Paroxysmal atrial fibrillation: Secondary | ICD-10-CM | POA: Diagnosis not present

## 2018-06-27 DIAGNOSIS — R001 Bradycardia, unspecified: Secondary | ICD-10-CM | POA: Diagnosis not present

## 2018-06-27 NOTE — Patient Outreach (Signed)
Follow up call to check on pt urinary and skin status. Mr. Bergdoll reports that Mrs. Villescas is doing well. She is up and they are playing dice right now! He says there has been no further sign of blood in her catheter. He also reports her skin in her perineal area and buttocks is nearly healed. He hopes all areas are closed this week and wants to get the catheter taken out at that time.   Advised pt I will be glad to come and remove the catheter when they are ready. Call me if any problems arise.  Eulah Pont. Myrtie Neither, MSN, [facility] Gerontological Nurse Practitioner [facility] Care Management [phone]

## 2018-06-29 NOTE — Progress Notes (Deleted)
NEUROLOGY FOLLOW UP OFFICE NOTE  [name] [id number]  HISTORY OF PRESENT ILLNESS: [name] is an 84 year old right-handed female with cerebellar degeneration, metastatic intermediate grade neuroendocrine carcinoma, hypertension, paroxysmal atrial fibrillation, IBS, hypothyroidism and history of TIA and simple partial seizures who follows up for left sided trigeminal neuralgia.  She is accompanied by her husband who supplements history.  UPDATE: Current medications:  Oxcarbazepine 150mg  twice daily; gabapentin 800mg /400mg /800mg  for seizure prophylaxis.  Amitriptyline 25mg  at bedtime for burning mouth syndrome.  06/19/18 LABS:  CMP with Na 138, K 4.1, Cl 104, CO2 24, glucose 116, BUN 9, Cr 0.87, t bili 0.5, ALP 118, AST 35, ALT 17;  HISTORY:  [name] reports history of an AVM on the right side of her face, diagnosed many years ago and therefore cannot use blood thinners.  However, prior MRA of the head and neck did not reveal any AVM.  She also reports pain from the right jaw radiating up to the right temple since 2017.  In 2018, she developed pain around the left eye which with heart whenever she moved her eye.  It eventually resolved.  She then developed a severe aching pain at her left TMJ which radiates up into her temple, which was daily.  It would last a few seconds and occur throughout the day.  It may occur spontaneously but also is aggravated by exertion but not talking, chewing or brushing her teeth.  Sed rate from 06/26/2017 was 34.  PAST MEDICAL HISTORY: Past Medical History:  Diagnosis Date  . Acute respiratory failure with hypoxia ([place]) 10/23/2015  . Allergic rhinitis    PT. DENIES  . Anxiety   . Aortic insufficiency    Echo 04/29/2018: EF 65-70, mild AS (mean 13), mod AI, Asc Aorta 42 mm (mildly dilated), mild LAE, PASP 41, pericardium normal in appearance.   . Arthritis    NECK  . Ataxia   . Bradycardia    primarily nocturnal  . Burning tongue syndrome  25 years  . Cancer ([place]) dx'd 06/2016   liver  . Cataract   . Cerebellar degeneration ([place])   . Chronic urinary tract infection   . Complication of anesthesia    low o2 sats, coded 30 years ago  . CVA (cerebral infarction) 05/2003  . Depression   . Encounter for antineoplastic chemotherapy 10/09/2016  . Gait disorder   . Gastric polyps   . GERD (gastroesophageal reflux disease)   . Goals of care, counseling/discussion 10/09/2016  . High cholesterol   . History of pericarditis   . Hyperlipidemia   . Hypotension   . Hypothyroidism   . IBS (irritable bowel syndrome)   . Obesity   . Paroxysmal atrial fibrillation ([name])    chads2vasc score is 6,  she is felt to be a poor candidate for anticoagulation  . Pericarditis   . Personal history of arterial venous malformation (AVM)    right side of face  . Seizure disorder ([place])   . Seizures ([place]) 2003   " smelling"- Gabapentin "no problem"  . Shortness of breath dyspnea    with exertion  . Sick sinus syndrome ([place])   . Sternum fx 10/27/2013  . Stroke [facility]) 5 years ago   Right side of face weak, slurred speach-   . Thyroid disease   . TIA (transient ischemic attack)   . UTI (lower urinary tract infection) 03/27/2016   "frequently"    MEDICATIONS: Current Outpatient Medications on File Prior to Visit  Medication Sig Dispense Refill  . ALPRAZolam (XANAX) 0.25 MG tablet Take 1 tablet (0.25 mg total) by mouth 2 (two) times daily as needed for anxiety. (Patient taking differently: Take 0.25 mg by mouth 2 (two) times daily. ) 30 tablet 1  . amitriptyline (ELAVIL) 25 MG tablet Take 1 tablet (25 mg total) by mouth at bedtime. 90 tablet 1  . amLODipine (NORVASC) 2.5 MG tablet Take 1 tablet (2.5 mg total) by mouth daily. 30 tablet 6  . capecitabine (XELODA) 500 MG tablet Take 3 Tablets (1500mg ) by mouth twice daily after a meal. Take on days 1-14 of each 28d cycle 84 tablet 2  . escitalopram (LEXAPRO) 20 MG tablet Take 1 tablet (20 mg total)  by mouth at bedtime. 90 tablet 1  . fluticasone (FLONASE) 50 MCG/ACT nasal spray Place 2 sprays into both nostrils at bedtime. 16 g 3  . furosemide (LASIX) 20 MG tablet TAKE 1 TABLET DAILY AS NEEDED FOR FLUID RETENTION (Patient taking differently: Take 20 mg by mouth as needed for fluid or edema. TAKE 1 TABLET DAILY AS NEEDED FOR FLUID RETENTION) 90 tablet 0  . gabapentin (NEURONTIN) 400 MG capsule Take 1-2 capsules (400-800 mg total) by mouth 3 (three) times daily. (Patient taking differently: Take 800 mg by mouth 3 (three) times daily. ) 720 capsule 1  . hydroxypropyl methylcellulose / hypromellose (ISOPTO TEARS / GONIOVISC) 2.5 % ophthalmic solution Place 1 drop into both eyes daily.     Marland Kitchen levofloxacin (LEVAQUIN) 250 MG tablet Take 1 tablet (250 mg total) by mouth daily. 3 tablet 0  . levothyroxine (SYNTHROID, LEVOTHROID) 75 MCG tablet TAKE 1 TABLET DAILY BEFORE BREAKFAST (Patient taking differently: Take 75 mcg by mouth daily before breakfast. ) 90 tablet 1  . lovastatin (MEVACOR) 40 MG tablet Take 1 tablet (40 mg total) by mouth at bedtime. 90 tablet 1  . Melatonin 3 MG TBDP Take 3-6 mg by mouth at bedtime as needed. 60 tablet 1  . omeprazole (PRILOSEC) 40 MG capsule Take 1 capsule (40 mg total) by mouth daily. 90 capsule 1  . ondansetron (ZOFRAN) 8 MG tablet Take 1 tablet (8 mg total) by mouth every 8 (eight) hours as needed for nausea or vomiting. 90 tablet 1  . OXcarbazepine (TRILEPTAL) 150 MG tablet Take 1 tablet (150 mg total) by mouth 2 (two) times daily. 60 tablet 3  . oxyCODONE-acetaminophen (PERCOCET) 5-325 MG tablet Take 1 tablet by mouth every 8 (eight) hours as needed for severe pain. 10 tablet 0  . temozolomide (TEMODAR) 140 MG capsule Take 2 capsules by mouth as directed.  2  . XARELTO 20 MG TABS tablet TAKE 1 TABLET DAILY WITH SUPPER 90 tablet 4   No current facility-administered medications on file prior to visit.     ALLERGIES: Allergies  Allergen Reactions  . Lisinopril  Cough    FAMILY HISTORY: Family History  Problem Relation Age of Onset  . Heart attack Father 48       fatal  . Coronary artery disease Brother   . Diabetes Brother   . Prostate cancer Brother   . Prostate cancer Son    \ SOCIAL HISTORY: Social History   Socioeconomic History  . Marital status: Married    Spouse name: Jo-Ann Johanning  . Number of children: 6  . Years of education: Not on file  . Highest education level: 10th grade  Occupational History  . Occupation: Agricultural engineer  Social Needs  . Financial resource strain: Not very hard  .  Food insecurity:    Worry: Never true    Inability: Never true  . Transportation needs:    Medical: No    Non-medical: No  Tobacco Use  . Smoking status: Former Smoker    Packs/day: 0.25    Years: 10.00    Pack years: 2.50    Types: Cigarettes    Last attempt to quit: 07/31/1975    Years since quitting: 42.9  . Smokeless tobacco: Never Used  Substance and Sexual Activity  . Alcohol use: No    Comment: Rare- maybe a drink ever 2 years  . Drug use: No  . Sexual activity: Not Currently  Lifestyle  . Physical activity:    Days per week: 0 days    Minutes per session: 0 min  . Stress: Not at all  Relationships  . Social connections:    Talks on phone: More than three times a week    Gets together: More than three times a week    Attends religious service: Never    Active member of club or organization: No    Attends meetings of clubs or organizations: Never    Relationship status: Married  . Intimate partner violence:    Fear of current or ex partner: No    Emotionally abused: No    Physically abused: No    Forced sexual activity: No  Other Topics Concern  . Not on file  Social History Narrative   Lives with husband, does have stairs, does not use them. Pt completed 10th grade.    REVIEW OF SYSTEMS: Constitutional: No fevers, chills, or sweats, no generalized fatigue, change in appetite Eyes: No visual changes, double  vision, eye pain Ear, nose and throat: No hearing loss, ear pain, nasal congestion, sore throat Cardiovascular: No chest pain, palpitations Respiratory:  No shortness of breath at rest or with exertion, wheezes GastrointestinaI: No nausea, vomiting, diarrhea, abdominal pain, fecal incontinence Genitourinary:  No dysuria, urinary retention or frequency Musculoskeletal:  No neck pain, back pain Integumentary: No rash, pruritus, skin lesions Neurological: as above Psychiatric: No depression, insomnia, anxiety Endocrine: No palpitations, fatigue, diaphoresis, mood swings, change in appetite, change in weight, increased thirst Hematologic/Lymphatic:  No purpura, petechiae. Allergic/Immunologic: no itchy/runny eyes, nasal congestion, recent allergic reactions, rashes  PHYSICAL EXAM: *** General: No acute distress.  Patient appears ***-groomed.  *** body habitus. Head:  Normocephalic/atraumatic Eyes:  Fundi examined but not visualized Neck: supple, no paraspinal tenderness, full range of motion Heart:  Regular rate and rhythm Lungs:  Clear to auscultation bilaterally Back: No paraspinal tenderness Neurological Exam: alert and oriented to person, place, and time. Attention span and concentration intact, recent and remote memory intact, fund of knowledge intact.  Speech fluent and not dysarthric, language intact.  CN II-XII intact. Bulk and tone normal; muscle strength 4+/5 upper extremities, 3+/5 lower extremities.  Sensation to light touch intact.  Deep tendon reflexes 2+ throughout, toes downgoing.  Finger-to-nose testing intact.  In wheelchair.  Unable to ambulate.  IMPRESSION: Left-sided trigeminal neuralgia  PLAN: 1.  Continue oxcarbazepine 150 mg twice daily. 2.  Follow-up in 6 months.  Metta Clines, DO   CC: Assunta Found, MD

## 2018-06-30 ENCOUNTER — Encounter: Payer: Self-pay | Admitting: Neurology

## 2018-06-30 ENCOUNTER — Ambulatory Visit: Payer: Medicare Other | Admitting: Neurology

## 2018-06-30 DIAGNOSIS — Z029 Encounter for administrative examinations, unspecified: Secondary | ICD-10-CM

## 2018-06-30 DIAGNOSIS — I480 Paroxysmal atrial fibrillation: Secondary | ICD-10-CM | POA: Diagnosis not present

## 2018-06-30 DIAGNOSIS — R001 Bradycardia, unspecified: Secondary | ICD-10-CM | POA: Diagnosis not present

## 2018-06-30 DIAGNOSIS — Z48812 Encounter for surgical aftercare following surgery on the circulatory system: Secondary | ICD-10-CM | POA: Diagnosis not present

## 2018-06-30 DIAGNOSIS — I2510 Atherosclerotic heart disease of native coronary artery without angina pectoris: Secondary | ICD-10-CM | POA: Diagnosis not present

## 2018-06-30 DIAGNOSIS — Z950 Presence of cardiac pacemaker: Secondary | ICD-10-CM | POA: Diagnosis not present

## 2018-06-30 DIAGNOSIS — I10 Essential (primary) hypertension: Secondary | ICD-10-CM | POA: Diagnosis not present

## 2018-07-01 DIAGNOSIS — R001 Bradycardia, unspecified: Secondary | ICD-10-CM | POA: Diagnosis not present

## 2018-07-01 DIAGNOSIS — I2510 Atherosclerotic heart disease of native coronary artery without angina pectoris: Secondary | ICD-10-CM | POA: Diagnosis not present

## 2018-07-01 DIAGNOSIS — I480 Paroxysmal atrial fibrillation: Secondary | ICD-10-CM | POA: Diagnosis not present

## 2018-07-01 DIAGNOSIS — Z48812 Encounter for surgical aftercare following surgery on the circulatory system: Secondary | ICD-10-CM | POA: Diagnosis not present

## 2018-07-01 DIAGNOSIS — Z950 Presence of cardiac pacemaker: Secondary | ICD-10-CM | POA: Diagnosis not present

## 2018-07-01 DIAGNOSIS — I10 Essential (primary) hypertension: Secondary | ICD-10-CM | POA: Diagnosis not present

## 2018-07-02 ENCOUNTER — Encounter: Payer: Medicare Other | Admitting: Internal Medicine

## 2018-07-03 ENCOUNTER — Encounter: Payer: Self-pay | Admitting: Internal Medicine

## 2018-07-03 DIAGNOSIS — R001 Bradycardia, unspecified: Secondary | ICD-10-CM | POA: Diagnosis not present

## 2018-07-03 DIAGNOSIS — I2510 Atherosclerotic heart disease of native coronary artery without angina pectoris: Secondary | ICD-10-CM | POA: Diagnosis not present

## 2018-07-03 DIAGNOSIS — Z48812 Encounter for surgical aftercare following surgery on the circulatory system: Secondary | ICD-10-CM | POA: Diagnosis not present

## 2018-07-03 DIAGNOSIS — I10 Essential (primary) hypertension: Secondary | ICD-10-CM | POA: Diagnosis not present

## 2018-07-03 DIAGNOSIS — I480 Paroxysmal atrial fibrillation: Secondary | ICD-10-CM | POA: Diagnosis not present

## 2018-07-03 DIAGNOSIS — Z950 Presence of cardiac pacemaker: Secondary | ICD-10-CM | POA: Diagnosis not present

## 2018-07-04 DIAGNOSIS — Z48812 Encounter for surgical aftercare following surgery on the circulatory system: Secondary | ICD-10-CM | POA: Diagnosis not present

## 2018-07-04 DIAGNOSIS — I10 Essential (primary) hypertension: Secondary | ICD-10-CM | POA: Diagnosis not present

## 2018-07-04 DIAGNOSIS — R001 Bradycardia, unspecified: Secondary | ICD-10-CM | POA: Diagnosis not present

## 2018-07-04 DIAGNOSIS — I480 Paroxysmal atrial fibrillation: Secondary | ICD-10-CM | POA: Diagnosis not present

## 2018-07-04 DIAGNOSIS — I2510 Atherosclerotic heart disease of native coronary artery without angina pectoris: Secondary | ICD-10-CM | POA: Diagnosis not present

## 2018-07-04 DIAGNOSIS — Z950 Presence of cardiac pacemaker: Secondary | ICD-10-CM | POA: Diagnosis not present

## 2018-07-08 ENCOUNTER — Other Ambulatory Visit: Payer: Self-pay | Admitting: Internal Medicine

## 2018-07-08 DIAGNOSIS — I2510 Atherosclerotic heart disease of native coronary artery without angina pectoris: Secondary | ICD-10-CM | POA: Diagnosis not present

## 2018-07-08 DIAGNOSIS — I480 Paroxysmal atrial fibrillation: Secondary | ICD-10-CM | POA: Diagnosis not present

## 2018-07-08 DIAGNOSIS — Z950 Presence of cardiac pacemaker: Secondary | ICD-10-CM | POA: Diagnosis not present

## 2018-07-08 DIAGNOSIS — Z48812 Encounter for surgical aftercare following surgery on the circulatory system: Secondary | ICD-10-CM | POA: Diagnosis not present

## 2018-07-08 DIAGNOSIS — R001 Bradycardia, unspecified: Secondary | ICD-10-CM | POA: Diagnosis not present

## 2018-07-08 DIAGNOSIS — I10 Essential (primary) hypertension: Secondary | ICD-10-CM | POA: Diagnosis not present

## 2018-07-10 ENCOUNTER — Ambulatory Visit (INDEPENDENT_AMBULATORY_CARE_PROVIDER_SITE_OTHER): Payer: Medicare Other | Admitting: Pediatrics

## 2018-07-10 ENCOUNTER — Encounter: Payer: Self-pay | Admitting: Pediatrics

## 2018-07-10 VITALS — BP 135/64 | HR 60 | Temp 98.1°F | Ht 64.0 in | Wt 224.0 lb

## 2018-07-10 DIAGNOSIS — E538 Deficiency of other specified B group vitamins: Secondary | ICD-10-CM

## 2018-07-10 DIAGNOSIS — G500 Trigeminal neuralgia: Secondary | ICD-10-CM | POA: Diagnosis not present

## 2018-07-10 DIAGNOSIS — R609 Edema, unspecified: Secondary | ICD-10-CM | POA: Diagnosis not present

## 2018-07-10 DIAGNOSIS — L909 Atrophic disorder of skin, unspecified: Secondary | ICD-10-CM | POA: Diagnosis not present

## 2018-07-10 DIAGNOSIS — I10 Essential (primary) hypertension: Secondary | ICD-10-CM | POA: Diagnosis not present

## 2018-07-10 DIAGNOSIS — I208 Other forms of angina pectoris: Secondary | ICD-10-CM

## 2018-07-10 DIAGNOSIS — I480 Paroxysmal atrial fibrillation: Secondary | ICD-10-CM | POA: Diagnosis not present

## 2018-07-10 DIAGNOSIS — R001 Bradycardia, unspecified: Secondary | ICD-10-CM | POA: Diagnosis not present

## 2018-07-10 DIAGNOSIS — R238 Other skin changes: Secondary | ICD-10-CM

## 2018-07-10 DIAGNOSIS — Z950 Presence of cardiac pacemaker: Secondary | ICD-10-CM | POA: Diagnosis not present

## 2018-07-10 DIAGNOSIS — Z48812 Encounter for surgical aftercare following surgery on the circulatory system: Secondary | ICD-10-CM | POA: Diagnosis not present

## 2018-07-10 DIAGNOSIS — I2510 Atherosclerotic heart disease of native coronary artery without angina pectoris: Secondary | ICD-10-CM | POA: Diagnosis not present

## 2018-07-10 DIAGNOSIS — E039 Hypothyroidism, unspecified: Secondary | ICD-10-CM | POA: Diagnosis not present

## 2018-07-10 NOTE — Progress Notes (Signed)
  Subjective:   Patient ID: [name], female    DOB: 18-Aug-1933, 84 y.o.   MRN: [id number] CC: Medical Management of Chronic Issues  HPI: [name] is a 84 y.o. female   Here today with husband.  Says she is feeling a lot better.  Strength is been back to normal.  Helping with transfers.  Skin breakdown of her buttocks has completely healed.  Trigeminal neuralgia: No recent symptoms since being on oxcarbazepine.  Hypothyroidism: Taking replacement regularly.  History of low B12 levels: Has been taking replacement oral B12.  Metastatic neuroendocrine tumor: Following with oncology.  On oral chemotherapy medicines.  Pulmonary embolism: On Xarelto  Lower extremity swelling: Has been having to take the Lasix more often, several days a week for the last couple weeks.  Relevant past medical, surgical, family and social history reviewed. Allergies and medications reviewed and updated. Social History   Tobacco Use  Smoking Status Former Smoker  . Packs/day: 0.25  . Years: 10.00  . Pack years: 2.50  . Types: Cigarettes  . Last attempt to quit: 07/31/1975  . Years since quitting: 42.9  Smokeless Tobacco Never Used   ROS: Per HPI   Objective:    BP 135/64   Pulse 60   Temp 98.1 F (36.7 C) (Oral)   Ht 5\' 4"  (1.626 m)   Wt 224 lb (101.6 kg)   BMI 38.45 kg/m   Wt Readings from Last 3 Encounters:  07/10/18 224 lb (101.6 kg)  06/24/18 224 lb (101.6 kg)  05/23/18 235 lb 14.3 oz (107 kg)    Gen: NAD, alert, cooperative with exam, NCAT EYES: EOMI, no conjunctival injection, or no icterus CV: NRRR, normal S1/S2, no murmur, distal pulses 2+ b/l Resp: CTABL, no wheezes, normal WOB Abd: +BS, soft, NTND. no guarding or organomegaly Ext: 1+ pitting edema bilateral ankles, warm Neuro: Alert and oriented, strength equal b/l UE and LE, coordination grossly normal Skin: Well-healing 2 cm wound right anterior shin, no surrounding erythema.  Assessment & Plan:  [name] was  seen today for medical management of chronic issues.  Diagnoses and all orders for this visit:  Vitamin B 12 deficiency Continue oral supplementation.  If still low, could consider IM injections by home health -     Vitamin B12  Hypothyroidism, unspecified type Stable, continue levothyroxine -     TSH  Skin breakdown Much improved.  Gave prescription for home health nurse to remove Foley.  Needs voiding trial.  Trigeminal neuralgia Well-controlled, continue Trileptal.  Most recent sodium level within normal limits.  Getting checked again next week.  Swelling Continue Lasix as needed.  Follow up plan: Return in about 3 months (around 10/09/2018). Assunta Found, MD [place]

## 2018-07-11 ENCOUNTER — Telehealth: Payer: Self-pay | Admitting: Internal Medicine

## 2018-07-11 ENCOUNTER — Ambulatory Visit: Payer: Medicare Other | Admitting: Pediatrics

## 2018-07-11 LAB — VITAMIN B12: Vitamin B-12: 1240 pg/mL (ref 232–1245)

## 2018-07-11 LAB — TSH: TSH: 1.77 u[IU]/mL (ref 0.450–4.500)

## 2018-07-11 NOTE — Telephone Encounter (Signed)
Provider changed - [place] out moved 12/24 f/u to 12/27. Spoke with spouse. Other appointments remain the same.

## 2018-07-15 ENCOUNTER — Ambulatory Visit (INDEPENDENT_AMBULATORY_CARE_PROVIDER_SITE_OTHER): Payer: Medicare Other

## 2018-07-15 ENCOUNTER — Other Ambulatory Visit: Payer: Self-pay | Admitting: Internal Medicine

## 2018-07-15 DIAGNOSIS — I10 Essential (primary) hypertension: Secondary | ICD-10-CM

## 2018-07-15 DIAGNOSIS — R001 Bradycardia, unspecified: Secondary | ICD-10-CM | POA: Diagnosis not present

## 2018-07-15 DIAGNOSIS — Z48812 Encounter for surgical aftercare following surgery on the circulatory system: Secondary | ICD-10-CM | POA: Diagnosis not present

## 2018-07-15 DIAGNOSIS — Z950 Presence of cardiac pacemaker: Secondary | ICD-10-CM | POA: Diagnosis not present

## 2018-07-15 DIAGNOSIS — I2510 Atherosclerotic heart disease of native coronary artery without angina pectoris: Secondary | ICD-10-CM

## 2018-07-15 DIAGNOSIS — C7A8 Other malignant neuroendocrine tumors: Secondary | ICD-10-CM

## 2018-07-15 DIAGNOSIS — C787 Secondary malignant neoplasm of liver and intrahepatic bile duct: Secondary | ICD-10-CM

## 2018-07-15 DIAGNOSIS — I480 Paroxysmal atrial fibrillation: Secondary | ICD-10-CM | POA: Diagnosis not present

## 2018-07-15 DIAGNOSIS — Z5111 Encounter for antineoplastic chemotherapy: Secondary | ICD-10-CM

## 2018-07-16 ENCOUNTER — Ambulatory Visit: Payer: Medicare Other | Admitting: Cardiovascular Disease

## 2018-07-17 DIAGNOSIS — I2510 Atherosclerotic heart disease of native coronary artery without angina pectoris: Secondary | ICD-10-CM | POA: Diagnosis not present

## 2018-07-17 DIAGNOSIS — R001 Bradycardia, unspecified: Secondary | ICD-10-CM | POA: Diagnosis not present

## 2018-07-17 DIAGNOSIS — Z950 Presence of cardiac pacemaker: Secondary | ICD-10-CM | POA: Diagnosis not present

## 2018-07-17 DIAGNOSIS — I10 Essential (primary) hypertension: Secondary | ICD-10-CM | POA: Diagnosis not present

## 2018-07-17 DIAGNOSIS — Z48812 Encounter for surgical aftercare following surgery on the circulatory system: Secondary | ICD-10-CM | POA: Diagnosis not present

## 2018-07-17 DIAGNOSIS — I480 Paroxysmal atrial fibrillation: Secondary | ICD-10-CM | POA: Diagnosis not present

## 2018-07-18 ENCOUNTER — Ambulatory Visit (HOSPITAL_COMMUNITY)
Admission: RE | Admit: 2018-07-18 | Discharge: 2018-07-18 | Disposition: A | Discharge status: Home / Self Care | Payer: Medicare Other | Source: Ambulatory Visit | Attending: Internal Medicine | Admitting: Internal Medicine

## 2018-07-18 ENCOUNTER — Inpatient Hospital Stay: Payer: Medicare Other | Attending: Internal Medicine

## 2018-07-18 ENCOUNTER — Ambulatory Visit (HOSPITAL_COMMUNITY): Payer: Medicare Other

## 2018-07-18 ENCOUNTER — Encounter (HOSPITAL_COMMUNITY): Payer: Self-pay

## 2018-07-18 DIAGNOSIS — R410 Disorientation, unspecified: Secondary | ICD-10-CM | POA: Diagnosis not present

## 2018-07-18 DIAGNOSIS — Z7901 Long term (current) use of anticoagulants: Secondary | ICD-10-CM | POA: Insufficient documentation

## 2018-07-18 DIAGNOSIS — Z86711 Personal history of pulmonary embolism: Secondary | ICD-10-CM | POA: Insufficient documentation

## 2018-07-18 DIAGNOSIS — C3490 Malignant neoplasm of unspecified part of unspecified bronchus or lung: Secondary | ICD-10-CM

## 2018-07-18 DIAGNOSIS — Z79899 Other long term (current) drug therapy: Secondary | ICD-10-CM | POA: Insufficient documentation

## 2018-07-18 DIAGNOSIS — Z5111 Encounter for antineoplastic chemotherapy: Secondary | ICD-10-CM | POA: Diagnosis not present

## 2018-07-18 DIAGNOSIS — C7A090 Malignant carcinoid tumor of the bronchus and lung: Secondary | ICD-10-CM | POA: Diagnosis not present

## 2018-07-18 DIAGNOSIS — C7B02 Secondary carcinoid tumors of liver: Secondary | ICD-10-CM | POA: Insufficient documentation

## 2018-07-18 LAB — CMP (CANCER CENTER ONLY)
ALBUMIN: 3.4 g/dL — AB (ref 3.5–5.0)
ALK PHOS: 114 U/L (ref 38–126)
ALT: 13 U/L (ref 0–44)
ANION GAP: 9 (ref 5–15)
AST: 24 U/L (ref 15–41)
BILIRUBIN TOTAL: 0.5 mg/dL (ref 0.3–1.2)
BUN: 10 mg/dL (ref 8–23)
CO2: 24 mmol/L (ref 22–32)
Calcium: 9.6 mg/dL (ref 8.9–10.3)
Chloride: 106 mmol/L (ref 98–111)
Creatinine: 0.84 mg/dL (ref 0.44–1.00)
GFR, Est AFR Am: >60 mL/min (ref >60)
GFR, Estimated: >60 mL/min (ref >60)
GLUCOSE: 110 mg/dL — AB (ref 70–99)
Potassium: 4.7 mmol/L (ref 3.5–5.1)
Sodium: 139 mmol/L (ref 135–145)
TOTAL PROTEIN: 6.5 g/dL (ref 6.5–8.1)

## 2018-07-18 LAB — CBC WITH DIFFERENTIAL (CANCER CENTER ONLY)
Abs Immature Granulocytes: 0.02 10*3/uL (ref 0.00–0.07)
BASOS PCT: 1 %
Basophils Absolute: 0 10*3/uL (ref 0.0–0.1)
Eosinophils Absolute: 0.2 10*3/uL (ref 0.0–0.5)
Eosinophils Relative: 5 %
HCT: 35.4 % — ABNORMAL LOW (ref 36.0–46.0)
Hemoglobin: 11.3 g/dL — ABNORMAL LOW (ref 12.0–15.0)
Immature Granulocytes: 1 %
Lymphocytes Relative: 18 %
Lymphs Abs: 0.8 10*3/uL (ref 0.7–4.0)
MCH: 34.6 pg — AB (ref 26.0–34.0)
MCHC: 31.9 g/dL (ref 30.0–36.0)
MCV: 108.3 fL — AB (ref 80.0–100.0)
MONO ABS: 0.5 10*3/uL (ref 0.1–1.0)
MONOS PCT: 12 %
NEUTROS PCT: 63 %
Neutro Abs: 2.8 10*3/uL (ref 1.7–7.7)
PLATELETS: 102 10*3/uL — AB (ref 150–400)
RBC: 3.27 MIL/uL — ABNORMAL LOW (ref 3.87–5.11)
RDW: 17 % — AB (ref 11.5–15.5)
WBC Count: 4.4 10*3/uL (ref 4.0–10.5)
nRBC: 0 % (ref 0.0–0.2)

## 2018-07-18 MED ORDER — SODIUM CHLORIDE (PF) 0.9 % IJ SOLN
INTRAMUSCULAR | Status: AC
  Filled 2018-07-18: qty 50

## 2018-07-18 MED ORDER — IOHEXOL 300 MG/ML  SOLN
100.0000 mL | Freq: Once | INTRAMUSCULAR | Status: AC | PRN
  Administered 2018-07-18: 100 mL via INTRAVENOUS

## 2018-07-21 DIAGNOSIS — R001 Bradycardia, unspecified: Secondary | ICD-10-CM | POA: Diagnosis not present

## 2018-07-21 DIAGNOSIS — I2510 Atherosclerotic heart disease of native coronary artery without angina pectoris: Secondary | ICD-10-CM | POA: Diagnosis not present

## 2018-07-21 DIAGNOSIS — I10 Essential (primary) hypertension: Secondary | ICD-10-CM | POA: Diagnosis not present

## 2018-07-21 DIAGNOSIS — Z48812 Encounter for surgical aftercare following surgery on the circulatory system: Secondary | ICD-10-CM | POA: Diagnosis not present

## 2018-07-21 DIAGNOSIS — I480 Paroxysmal atrial fibrillation: Secondary | ICD-10-CM | POA: Diagnosis not present

## 2018-07-21 DIAGNOSIS — Z950 Presence of cardiac pacemaker: Secondary | ICD-10-CM | POA: Diagnosis not present

## 2018-07-22 ENCOUNTER — Other Ambulatory Visit: Payer: Self-pay | Admitting: Neurology

## 2018-07-22 ENCOUNTER — Ambulatory Visit: Payer: Medicare Other | Admitting: Oncology

## 2018-07-25 ENCOUNTER — Telehealth: Payer: Self-pay | Admitting: Internal Medicine

## 2018-07-25 ENCOUNTER — Inpatient Hospital Stay (HOSPITAL_BASED_OUTPATIENT_CLINIC_OR_DEPARTMENT_OTHER): Payer: Medicare Other | Admitting: Internal Medicine

## 2018-07-25 ENCOUNTER — Encounter: Payer: Self-pay | Admitting: Internal Medicine

## 2018-07-25 VITALS — BP 130/69 | HR 89 | Temp 97.6°F | Resp 17 | Ht 64.0 in | Wt 225.6 lb

## 2018-07-25 DIAGNOSIS — C7B02 Secondary carcinoid tumors of liver: Secondary | ICD-10-CM | POA: Diagnosis not present

## 2018-07-25 DIAGNOSIS — Z7901 Long term (current) use of anticoagulants: Secondary | ICD-10-CM

## 2018-07-25 DIAGNOSIS — Z86711 Personal history of pulmonary embolism: Secondary | ICD-10-CM

## 2018-07-25 DIAGNOSIS — C7B8 Other secondary neuroendocrine tumors: Secondary | ICD-10-CM

## 2018-07-25 DIAGNOSIS — I10 Essential (primary) hypertension: Secondary | ICD-10-CM

## 2018-07-25 DIAGNOSIS — C3492 Malignant neoplasm of unspecified part of left bronchus or lung: Secondary | ICD-10-CM

## 2018-07-25 DIAGNOSIS — C787 Secondary malignant neoplasm of liver and intrahepatic bile duct: Secondary | ICD-10-CM

## 2018-07-25 DIAGNOSIS — C7A090 Malignant carcinoid tumor of the bronchus and lung: Secondary | ICD-10-CM | POA: Diagnosis not present

## 2018-07-25 DIAGNOSIS — Z79899 Other long term (current) drug therapy: Secondary | ICD-10-CM

## 2018-07-25 DIAGNOSIS — D329 Benign neoplasm of meninges, unspecified: Secondary | ICD-10-CM

## 2018-07-25 DIAGNOSIS — Z5111 Encounter for antineoplastic chemotherapy: Secondary | ICD-10-CM

## 2018-07-25 DIAGNOSIS — C7A8 Other malignant neuroendocrine tumors: Secondary | ICD-10-CM

## 2018-07-25 MED FILL — TEMOZOLOMIDE 140 MG CAPS: 140 | 28 days supply | Qty: 10 | Fill #0

## 2018-07-25 MED FILL — CAPECITABINE 500 MG TABS: 500 | 14 days supply | Qty: 84 | Fill #0

## 2018-07-25 NOTE — Progress Notes (Signed)
[place] [phone]   [phone]  OFFICE PROGRESS NOTE  [name], [place] [id number]  DIAGNOSIS:  1) Metastatic intermediate. Neuroendocrine tumor of lung primary diagnosed in January 2018 and presented with small bilateral pulmonary nodules in addition to multiple liver metastasis. 2) right lower lobe pulmonary embolism diagnosed incidentally on CT scan of the chest on 04/23/2017  PRIOR THERAPY:  1) Status post radio embolization with Y 90 to the liver lesions by interventional radiology. 2) status post IVC filter placement by interventional radiology on 04/24/2017  CURRENT THERAPY: Xeloda 750 MG/M2 twice a day days 1-14 and Temodar 150 MG/M2 days 10-14 every 4 weeks. Status post 25 cycles. She will start cycle #26 on July 27, 2018.  INTERVAL HISTORY: [name] 84 y.o. female returns to the clinic today for follow-up visit accompanied by her husband.  The patient is feeling fine today with no concerning complaints except for the persistent headache as well as shortness of breath with exertion.  She enjoyed her Christmas holiday with her family.  The patient denied having any chest pain, cough or hemoptysis.  She denied having any fever or chills.  She has no nausea, vomiting, diarrhea or constipation.  She continues to tolerate her treatment with Xeloda and Temodar fairly well.  The patient had repeat CT scan of the head, chest, abdomen and pelvis performed recently and she is here for evaluation and discussion of her risk her results and treatment options.  MEDICAL HISTORY: Past Medical History:  Diagnosis Date  . Acute respiratory failure with hypoxia ([place]) 10/23/2015  . Allergic rhinitis    PT. DENIES  . Anxiety   . Aortic insufficiency    Echo 04/29/2018: EF 65-70, mild AS (mean 13), mod AI, Asc Aorta 42 mm (mildly dilated), mild LAE, PASP 41, pericardium normal in appearance.   . Arthritis    NECK  .  Ataxia   . Bradycardia    primarily nocturnal  . Burning tongue syndrome 25 years  . Cancer ([place]) dx'd 06/2016   liver  . Cataract   . Cerebellar degeneration ([place])   . Chronic urinary tract infection   . Complication of anesthesia    low o2 sats, coded 30 years ago  . CVA (cerebral infarction) 05/2003  . Depression   . Encounter for antineoplastic chemotherapy 10/09/2016  . Gait disorder   . Gastric polyps   . GERD (gastroesophageal reflux disease)   . Goals of care, counseling/discussion 10/09/2016  . High cholesterol   . History of pericarditis   . Hyperlipidemia   . Hypotension   . Hypothyroidism   . IBS (irritable bowel syndrome)   . Obesity   . Paroxysmal atrial fibrillation ([name])    chads2vasc score is 6,  she is felt to be a poor candidate for anticoagulation  . Pericarditis   . Personal history of arterial venous malformation (AVM)    right side of face  . Seizure disorder ([place])   . Seizures ([place]) 2003   " smelling"- Gabapentin "no problem"  . Shortness of breath dyspnea    with exertion  . Sick sinus syndrome ([place])   . Sternum fx 10/27/2013  . Stroke [facility]) 5 years ago   Right side of face weak, slurred speach-   . Thyroid disease   . TIA (transient ischemic attack)   . UTI (lower urinary tract infection) 03/27/2016   "frequently"    ALLERGIES:  is allergic to lisinopril.  MEDICATIONS:  Current Outpatient Medications  Medication Sig Dispense Refill  . ALPRAZolam (XANAX) 0.25 MG tablet Take 1 tablet (0.25 mg total) by mouth 2 (two) times daily as needed for anxiety. (Patient taking differently: Take 0.25 mg by mouth 2 (two) times daily. ) 30 tablet 1  . amitriptyline (ELAVIL) 25 MG tablet Take 1 tablet (25 mg total) by mouth at bedtime. 90 tablet 1  . amLODipine (NORVASC) 2.5 MG tablet Take 1 tablet (2.5 mg total) by mouth daily. 30 tablet 6  . capecitabine (XELODA) 500 MG tablet TAKE 3 TABLETS (1500MG ) BY MOUTH TWICE DAILY AFTER A MEAL. TAKE ON DAYS 1-14 OF  EACH 28 DAY CYCLE 84 tablet 2  . escitalopram (LEXAPRO) 20 MG tablet Take 1 tablet (20 mg total) by mouth at bedtime. 90 tablet 1  . fluticasone (FLONASE) 50 MCG/ACT nasal spray Place 2 sprays into both nostrils at bedtime. 16 g 3  . furosemide (LASIX) 20 MG tablet TAKE 1 TABLET DAILY AS NEEDED FOR FLUID RETENTION (Patient taking differently: Take 20 mg by mouth as needed for fluid or edema. TAKE 1 TABLET DAILY AS NEEDED FOR FLUID RETENTION) 90 tablet 0  . gabapentin (NEURONTIN) 400 MG capsule Take 1-2 capsules (400-800 mg total) by mouth 3 (three) times daily. (Patient taking differently: Take 800 mg by mouth 3 (three) times daily. ) 720 capsule 1  . hydroxypropyl methylcellulose / hypromellose (ISOPTO TEARS / GONIOVISC) 2.5 % ophthalmic solution Place 1 drop into both eyes daily.     Marland Kitchen levothyroxine (SYNTHROID, LEVOTHROID) 75 MCG tablet TAKE 1 TABLET DAILY BEFORE BREAKFAST (Patient taking differently: Take 75 mcg by mouth daily before breakfast. ) 90 tablet 1  . lovastatin (MEVACOR) 40 MG tablet Take 1 tablet (40 mg total) by mouth at bedtime. 90 tablet 1  . Melatonin 3 MG TBDP Take 3-6 mg by mouth at bedtime as needed. 60 tablet 1  . omeprazole (PRILOSEC) 40 MG capsule Take 1 capsule (40 mg total) by mouth daily. 90 capsule 1  . ondansetron (ZOFRAN) 8 MG tablet Take 1 tablet (8 mg total) by mouth every 8 (eight) hours as needed for nausea or vomiting. 90 tablet 1  . OXcarbazepine (TRILEPTAL) 150 MG tablet TAKE 1 TABLET BY MOUTH TWICE DAILY 60 tablet 5  . oxyCODONE-acetaminophen (PERCOCET) 5-325 MG tablet Take 1 tablet by mouth every 8 (eight) hours as needed for severe pain. 10 tablet 0  . temozolomide (TEMODAR) 140 MG capsule TAKE 2 CAPSULES (280MG ) BY MOUTH DAILY FOR 5 DAYS ON DAYS 10-14 OF EACH 28D CYCLE. MAY TAKE ON EMPTY STOMACH AT BEDTIME TO DECREASE NAUSEA A 10 capsule 2  . XARELTO 20 MG TABS tablet TAKE 1 TABLET DAILY WITH SUPPER 90 tablet 4   No current facility-administered medications  for this visit.     SURGICAL HISTORY:  Past Surgical History:  Procedure Laterality Date  . APPENDECTOMY  84 years old  . COLONOSCOPY  2006, 2009  . COLONOSCOPY WITH PROPOFOL N/A 03/28/2016   Procedure: COLONOSCOPY WITH PROPOFOL;  Surgeon: Gatha Mayer, MD;  Location: [facility];  Service: Endoscopy;  Laterality: N/A;  . CORONARY ANGIOGRAPHY N/A 05/22/2018   Procedure: CORONARY ANGIOGRAPHY (CATH LAB);  Surgeon: Lorretta Harp, MD;  Location: [facility] CV LAB;  Service: Cardiovascular;  Laterality: N/A;  . cyst removed  35 years ago  . EP IMPLANTABLE DEVICE N/A 01/06/2015   Procedure: Loop Recorder Insertion;  Surgeon: Thompson Grayer, MD;  Location: [place]  CV LAB;  Service: Cardiovascular;  Laterality: N/A;  . EYE SURGERY Right    Cataract  . IR ANGIOGRAM SELECTIVE EACH ADDITIONAL VESSEL  11/28/2016  . IR ANGIOGRAM SELECTIVE EACH ADDITIONAL VESSEL  11/28/2016  . IR ANGIOGRAM SELECTIVE EACH ADDITIONAL VESSEL  11/28/2016  . IR ANGIOGRAM SELECTIVE EACH ADDITIONAL VESSEL  11/28/2016  . IR ANGIOGRAM SELECTIVE EACH ADDITIONAL VESSEL  12/13/2016  . IR ANGIOGRAM SELECTIVE EACH ADDITIONAL VESSEL  12/13/2016  . IR ANGIOGRAM SELECTIVE EACH ADDITIONAL VESSEL  01/09/2017  . IR ANGIOGRAM VISCERAL SELECTIVE  11/28/2016  . IR ANGIOGRAM VISCERAL SELECTIVE  11/28/2016  . IR ANGIOGRAM VISCERAL SELECTIVE  12/13/2016  . IR ANGIOGRAM VISCERAL SELECTIVE  12/13/2016  . IR ANGIOGRAM VISCERAL SELECTIVE  01/09/2017  . IR EMBO ARTERIAL NOT HEMORR HEMANG INC GUIDE ROADMAPPING  11/28/2016  . IR EMBO TUMOR ORGAN ISCHEMIA INFARCT INC GUIDE ROADMAPPING  12/13/2016  . IR EMBO TUMOR ORGAN ISCHEMIA INFARCT INC GUIDE ROADMAPPING  01/09/2017  . IR IVC FILTER PLMT / S&I /IMG GUID/MOD SED  04/24/2017  . IR RADIOLOGIST EVAL & MGMT  11/06/2016  . IR RADIOLOGIST EVAL & MGMT  01/02/2017  . IR RADIOLOGIST EVAL & MGMT  02/05/2017  . IR RADIOLOGIST EVAL & MGMT  05/02/2017  . IR RADIOLOGIST EVAL & MGMT  10/17/2017  . IR US GUIDE VASC ACCESS RIGHT   11/28/2016  . IR US GUIDE VASC ACCESS RIGHT  12/13/2016  . IR US GUIDE VASC ACCESS RIGHT  01/09/2017  . KNEE ARTHROSCOPY Right 11/14/2006  . KNEE ARTHROSCOPY Bilateral 5 and 6 years ago  . KNEE ARTHROSCOPY WITH LATERAL MENISECTOMY  07/03/2012   Procedure: KNEE ARTHROSCOPY WITH LATERAL MENISECTOMY;  Surgeon: Magnus Sinning, MD;  Location: [facility];  Service: Orthopedics;  Laterality: Left;  with Partial Lateral Menisectomy and Medial Menisectomy. Shaving of medial and lateral femoral condyles. Shaving of patella. Removal of a loose body  . LOOP RECORDER REMOVAL N/A 05/22/2018   Procedure: LOOP RECORDER REMOVAL;  Surgeon: Thompson Grayer, MD;  Location: [place] CV LAB;  Service: Cardiovascular;  Laterality: N/A;  . PACEMAKER IMPLANT N/A 05/22/2018   Procedure: PACEMAKER IMPLANT;  Surgeon: Thompson Grayer, MD;  Location: [place] CV LAB;  Service: Cardiovascular;  Laterality: N/A;  . tibial and fibular internal fixation Left   . TOTAL ABDOMINAL HYSTERECTOMY  84 years old  . UPPER GASTROINTESTINAL ENDOSCOPY  2009, 2013    REVIEW OF SYSTEMS:  Constitutional: positive for fatigue Eyes: negative Ears, nose, mouth, throat, and face: negative Respiratory: positive for dyspnea on exertion Cardiovascular: negative Gastrointestinal: negative Genitourinary:negative Integument/breast: negative Hematologic/lymphatic: negative Musculoskeletal:negative Neurological: positive for headaches Behavioral/Psych: negative Endocrine: negative Allergic/Immunologic: negative   PHYSICAL EXAMINATION: General appearance: alert, cooperative, fatigued and no distress Head: Normocephalic, without obvious abnormality, atraumatic Neck: no adenopathy, no JVD, supple, symmetrical, trachea midline and thyroid not enlarged, symmetric, no tenderness/mass/nodules Lymph nodes: Cervical, supraclavicular, and axillary nodes normal. Resp: clear to auscultation bilaterally Back: symmetric, no curvature. ROM normal. No CVA  tenderness. Cardio: regular rate and rhythm, S1, S2 normal, no murmur, click, rub or gallop GI: soft, non-tender; bowel sounds normal; no masses,  no organomegaly Extremities: extremities normal, atraumatic, no cyanosis or edema Neurologic: Alert and oriented X 3, normal strength and tone. Normal symmetric reflexes. Normal coordination and gait  ECOG PERFORMANCE STATUS: 1 - Symptomatic but completely ambulatory  Blood pressure 130/69, pulse 89, temperature 97.6 F (36.4 C), temperature source Oral, resp. rate 17, height 5\' 4"  (1.626 m), weight 225 lb 9.6  oz (102.3 kg), SpO2 97 %.  LABORATORY DATA: Lab Results  Component Value Date   WBC 4.4 07/18/2018   HGB 11.3 (L) 07/18/2018   HCT 35.4 (L) 07/18/2018   MCV 108.3 (H) 07/18/2018   PLT 102 (L) 07/18/2018      Chemistry      Component Value Date/Time   NA 139 07/18/2018 1215   NA 134 05/17/2018 1151   NA 135 (L) 07/10/2017 1142   K 4.7 07/18/2018 1215   K 4.6 07/10/2017 1142   CL 106 07/18/2018 1215   CO2 24 07/18/2018 1215   CO2 25 07/10/2017 1142   BUN 10 07/18/2018 1215   BUN 10 05/17/2018 1151   BUN 11.4 07/10/2017 1142   CREATININE 0.84 07/18/2018 1215   CREATININE 1.0 07/10/2017 1142      Component Value Date/Time   CALCIUM 9.6 07/18/2018 1215   CALCIUM 9.9 07/10/2017 1142   ALKPHOS 114 07/18/2018 1215   ALKPHOS 93 07/10/2017 1142   AST 24 07/18/2018 1215   AST 43 (H) 07/10/2017 1142   ALT 13 07/18/2018 1215   ALT 28 07/10/2017 1142   BILITOT 0.5 07/18/2018 1215   BILITOT 0.56 07/10/2017 1142       RADIOGRAPHIC STUDIES: Ct Head W Wo Contrast  Result Date: 07/18/2018 CLINICAL DATA:  Acute confusion. History of lung cancer and metastatic disease. EXAM: CT HEAD WITHOUT AND WITH CONTRAST TECHNIQUE: Contiguous axial images were obtained from the base of the skull through the vertex without and with intravenous contrast CONTRAST:  133mL OMNIPAQUE IOHEXOL 300 MG/ML  SOLN COMPARISON:  CT 07/13/2016.  MRI  07/14/2016 FINDINGS: Brain: Mild atrophy. Moderate chronic microvascular ischemic change in the white matter. Chronic infarcts in the cerebellum bilaterally. Negative for acute infarct. Negative for hemorrhage. Enhancing dural-based mass right posterior tentorium adjacent to the falx. This is most compatible with meningioma and measures 11 x 14 x 20 mm. This shows interval growth since the prior study when the lesion measured approximately 7 x 9 x 15 cm. Other enhancing mass lesions identified. Vascular: Negative for hyperdense vessel Skull: Negative Sinuses/Orbits: Paranasal sinuses clear. Right cataract surgery. No orbital mass Other: None IMPRESSION: Atrophy and chronic ischemia.  No acute infarct Enhancing dural-based mass along the right tentorium compatible with meningioma with interval growth since 07/14/2016 Electronically Signed   By: Franchot Gallo M.D.   On: 07/18/2018 14:21   Ct Chest W Contrast  Result Date: 07/18/2018 CLINICAL DATA:  Metastatic neuroendocrine tumor of the lung. Ongoing chemotherapy. Restaging and assessment of response to therapy. EXAM: CT CHEST, ABDOMEN, AND PELVIS WITH CONTRAST TECHNIQUE: Multidetector CT imaging of the chest, abdomen and pelvis was performed following the standard protocol during bolus administration of intravenous contrast. CONTRAST:  150mL OMNIPAQUE IOHEXOL 300 MG/ML  SOLN COMPARISON:  Multiple exams, including 04/18/2018 FINDINGS: CT CHEST FINDINGS Cardiovascular: Coronary, aortic arch, and branch vessel atherosclerotic vascular disease. Ascending thoracic aortic aneurysm at 4.2 cm in diameter. Mild cardiomegaly. Dual lead pacer. Mediastinum/Nodes: Unremarkable Lungs/Pleura: Stable 5 by 4 mm right upper lobe pulmonary nodule on image 32/9. Scar-like density anteriorly in the left upper lobe with nodular portion measuring 1.0 cm in tangential thickness, previously the same by my measurements. Numerous tiny pulmonary nodules are likewise stable in size and  number. Musculoskeletal: Thoracic kyphosis and spondylosis. Deformity of the mid sternal body due to an old healed fracture. CT ABDOMEN PELVIS FINDINGS Hepatobiliary: Indistinctly marginated metastatic lesions in segments 2, 3, and 8 of the liver. The largest of  these is the segment 3 lesion which measures 3.4 by 3.4 cm on image 46/7, formerly by my measurements 3.3 by 3.3 cm. The peripheral lesion in segment 8 may have some overlying capsular retraction and measures 1.2 cm anterior-posterior, formerly 1.3 cm. The lesion in segment 2 measures about 1.7 cm transverse, previously indistinct but about 1.5 cm. Overall on balance the liver lesions are roughly stable no new lesions are identified. High density in the somewhat contracted gallbladder possibly from sludge, vicarious contrast excretion, or gallstones. Pancreas: Unremarkable Spleen: Unremarkable Adrenals/Urinary Tract: 6 mm hypodense lesion peripherally in the right mid kidney on image 60/7, probably cysts but technically too small to characterize. Adrenal glands and bladder appear normal. Stomach/Bowel: Unremarkable Vascular/Lymphatic: Aortoiliac atherosclerotic vascular disease. Infrarenal IVC filter is in place with the apex just below the level of the renal veins, well positioned. Reproductive: Uterus absent.  Adnexa unremarkable. Other: No supplemental non-categorized findings. Musculoskeletal: Unremarkable IMPRESSION: 1. Stable scattered small pulmonary nodules and stable size and appearance of hypodense lesions in the liver. No new lesions or significant change. 2. Ascending thoracic aortic aneurysm 4.2 cm in diameter. Recommend annual imaging followup by CTA or MRA. This recommendation follows 2010 ACCF/AHA/AATS/ACR/ASA/SCA/SCAI/SIR/STS/SVM Guidelines for the Diagnosis and Management of Patients with Thoracic Aortic Disease. Circulation. 2010; 121: M767-M094 3. Other imaging findings of potential clinical significance: Aortic Atherosclerosis  (ICD10-I70.0). Coronary atherosclerosis. Mild cardiomegaly. High density in the gallbladder potentially from vicarious contrast excretion, sludge, or gallstones. Electronically Signed   By: Van Clines M.D.   On: 07/18/2018 16:46   Ct Abdomen Pelvis W Contrast  Result Date: 07/18/2018 CLINICAL DATA:  Metastatic neuroendocrine tumor of the lung. Ongoing chemotherapy. Restaging and assessment of response to therapy. EXAM: CT CHEST, ABDOMEN, AND PELVIS WITH CONTRAST TECHNIQUE: Multidetector CT imaging of the chest, abdomen and pelvis was performed following the standard protocol during bolus administration of intravenous contrast. CONTRAST:  171mL OMNIPAQUE IOHEXOL 300 MG/ML  SOLN COMPARISON:  Multiple exams, including 04/18/2018 FINDINGS: CT CHEST FINDINGS Cardiovascular: Coronary, aortic arch, and branch vessel atherosclerotic vascular disease. Ascending thoracic aortic aneurysm at 4.2 cm in diameter. Mild cardiomegaly. Dual lead pacer. Mediastinum/Nodes: Unremarkable Lungs/Pleura: Stable 5 by 4 mm right upper lobe pulmonary nodule on image 32/9. Scar-like density anteriorly in the left upper lobe with nodular portion measuring 1.0 cm in tangential thickness, previously the same by my measurements. Numerous tiny pulmonary nodules are likewise stable in size and number. Musculoskeletal: Thoracic kyphosis and spondylosis. Deformity of the mid sternal body due to an old healed fracture. CT ABDOMEN PELVIS FINDINGS Hepatobiliary: Indistinctly marginated metastatic lesions in segments 2, 3, and 8 of the liver. The largest of these is the segment 3 lesion which measures 3.4 by 3.4 cm on image 46/7, formerly by my measurements 3.3 by 3.3 cm. The peripheral lesion in segment 8 may have some overlying capsular retraction and measures 1.2 cm anterior-posterior, formerly 1.3 cm. The lesion in segment 2 measures about 1.7 cm transverse, previously indistinct but about 1.5 cm. Overall on balance the liver lesions are  roughly stable no new lesions are identified. High density in the somewhat contracted gallbladder possibly from sludge, vicarious contrast excretion, or gallstones. Pancreas: Unremarkable Spleen: Unremarkable Adrenals/Urinary Tract: 6 mm hypodense lesion peripherally in the right mid kidney on image 60/7, probably cysts but technically too small to characterize. Adrenal glands and bladder appear normal. Stomach/Bowel: Unremarkable Vascular/Lymphatic: Aortoiliac atherosclerotic vascular disease. Infrarenal IVC filter is in place with the apex just below the level of the renal  veins, well positioned. Reproductive: Uterus absent.  Adnexa unremarkable. Other: No supplemental non-categorized findings. Musculoskeletal: Unremarkable IMPRESSION: 1. Stable scattered small pulmonary nodules and stable size and appearance of hypodense lesions in the liver. No new lesions or significant change. 2. Ascending thoracic aortic aneurysm 4.2 cm in diameter. Recommend annual imaging followup by CTA or MRA. This recommendation follows 2010 ACCF/AHA/AATS/ACR/ASA/SCA/SCAI/SIR/STS/SVM Guidelines for the Diagnosis and Management of Patients with Thoracic Aortic Disease. Circulation. 2010; 121: Z610-R604 3. Other imaging findings of potential clinical significance: Aortic Atherosclerosis (ICD10-I70.0). Coronary atherosclerosis. Mild cardiomegaly. High density in the gallbladder potentially from vicarious contrast excretion, sludge, or gallstones. Electronically Signed   By: Van Clines M.D.   On: 07/18/2018 16:46   Enhancing dural based mass along the right tentorium ASSESSMENT AND PLAN:  This is a very pleasant 84 years old white female with metastatic intermediate grade neuroendocrine carcinoma of questionable lung primary and multiple metastatic liver lesions and pancreatic lesions.She status post treatment with radio embolization with Y90 to the left and right lobe liver lesions.Status post treatment with Y 90 to the left and  right lobes of the liver. She is currently undergoing systemic chemotherapy with Xeloda and Temodar status post 25 cycles. She has been tolerating her treatment well with no concerning adverse effects. She had repeat CT scan of the chest, abdomen and pelvis that showed no concerning findings for disease progression. I recommended for the patient to proceed with cycle #26 as a schedule on July 27, 2018. For the persistent headache, CT scan of the head with and without contrast showed enhancing dural based mass along the right tentorium compatible with meningioma with interval growth since December 2017.  I discussed with the patient referral to neurosurgery for evaluation but she declined this option.  She would like to continue with symptomatic management. I will see the patient back for follow-up visit in 1 months for evaluation with repeat blood work and management of any adverse effect of her treatment. She was advised to call immediately if she has any concerning symptoms in the interval. The patient voices understanding of current disease status and treatment options and is in agreement with the current care plan. All questions were answered. The patient knows to call the clinic with any problems, questions or concerns. We can certainly see the patient much sooner if necessary.  Disclaimer: This note was dictated with voice recognition software. Similar sounding words can inadvertently be transcribed and may not be corrected upon review.

## 2018-07-25 NOTE — Telephone Encounter (Signed)
Printed calendar and avs. °

## 2018-07-28 ENCOUNTER — Other Ambulatory Visit: Payer: Self-pay | Admitting: *Deleted

## 2018-07-28 ENCOUNTER — Ambulatory Visit: Payer: Medicare Other

## 2018-07-28 NOTE — Patient Outreach (Signed)
Telephone assessment: Spoke to Mr. Friedly this morning. He reports the family had a wonderful Christmas. All the family was in and this was very good for Mrs. Utz.   Her condition has continued to improve. Her skin is healed. Her catheter has been removed. He is taking care to ensure she is clean and dry. He is using the [facility] and has been able to not only get her out of bed to [facility] and chair but into the shower, on shower chair for a good bathing. They no longer have home health since 07/20/18. Mrs. Shugrue is eating well. She is voiding well and having regular BMs. Her mental status has improved dramatically and she is able to answer and call out on the phone, use the remote control for the TV and she and her husband sit at the table and play games.  He denies any need for additional help.   I have advised him to call me if any need arises and I will call them back in a month.  Eulah Pont. Myrtie Neither, MSN, [facility] Gerontological Nurse Practitioner [facility] Care Management [phone]

## 2018-08-09 ENCOUNTER — Other Ambulatory Visit: Payer: Self-pay | Admitting: Pediatrics

## 2018-08-09 DIAGNOSIS — R609 Edema, unspecified: Secondary | ICD-10-CM

## 2018-08-18 ENCOUNTER — Telehealth: Payer: Self-pay

## 2018-08-18 NOTE — Telephone Encounter (Addendum)
Oral Chemotherapy Pharmacist Encounter  Follow-Up Form  Spoke with patient's husband today to follow up regarding patient's oral chemotherapy medication: Temodar/Xeloda for the treatment of metastatic intermediate grade neuroendocrine carcinoma.  Original Start date of oral chemotherapy: 10/12/16  Pt is doing well today  Pt reports 0 tablets/doses of Temodar 140 mg capsules, 2 capsules (280 mg) by mouth daily on days 10-14 of each 28-day cycle and Xeloda 500 mg tablets, 3 tablets (1500 mg) by mouth twice daily after a meal on days 1-14 of each 28-day cycle missed in the last month.    Pt reports the following side effects: No new side effects reported. He stated her SOB is still present but has improved some since her pacemaker placement. Her fatigue level has shown little improvement but still occurs. The diarrhea is off and on but completely subsides after a couple days post-Xeloda doses. She experiences tolerable constipation following Zofran but resolves after about a day.  Pertinent labs reviewed: lymphocytes, platelets, ANC, LFTs reviewed.  Other Issues: He stated the pt has had no skin changes besides dry feet that he keeps moisturized. He did report her skin tearing, bruising, and bleeding easily d/t Xarelto and taking a very long time to heal. Pt is on Xarelto for stroke prevention d/t Afib.  Patient knows to call the office with questions or concerns. Oral Oncology Clinic will continue to follow.  Thank you,  Ricardo Jericho, PharmD Candidate 08/18/2018 4:52 PM Oral Oncology Clinic [phone]  I discussed / reviewed the pharmacy note by Ricardo Jericho, PharmD Candidate and I agree with the student's findings and plans as documented.  Johny Drilling, PharmD, BCPS, [facility] Clinical Pharmacist 08/19/2018 2:31 PM  Oral Chemo Clinic: [phone]

## 2018-08-19 MED FILL — TEMOZOLOMIDE 140 MG CAPS: 140 | 28 days supply | Qty: 10 | Fill #1

## 2018-08-19 MED FILL — CAPECITABINE 500 MG TABS: 500 | 14 days supply | Qty: 84 | Fill #1

## 2018-08-25 ENCOUNTER — Other Ambulatory Visit: Payer: Self-pay | Admitting: *Deleted

## 2018-08-25 NOTE — Patient Outreach (Signed)
Telephone outreach to follow up on pt status. Talked with [name] today who reports his wife is doing exceptionally well. He is able to get her out of bed without the [employer] on most occasions. She is participating in activities of daily living and participating in social activities in the home. He reports the only lingering problems she has is itching and she is clawing her skin until it bleeds.  Pt has no allergies to any medications. She does not take a po allergy medication. I have advised [name] to trial her on Cetirizine 1 po daily for a trial period up to 30 days. I will visit them in one week!  [name]. Rebekah Neither, MSN, [facility] Gerontological Nurse Practitioner [facility] Care Management [phone]

## 2018-08-27 ENCOUNTER — Other Ambulatory Visit: Payer: Self-pay | Admitting: Pediatrics

## 2018-08-27 ENCOUNTER — Encounter: Payer: Self-pay | Admitting: Internal Medicine

## 2018-08-27 ENCOUNTER — Ambulatory Visit (INDEPENDENT_AMBULATORY_CARE_PROVIDER_SITE_OTHER): Payer: Medicare Other | Admitting: Internal Medicine

## 2018-08-27 VITALS — BP 134/68 | HR 66 | Ht 64.0 in | Wt 225.0 lb

## 2018-08-27 DIAGNOSIS — Z950 Presence of cardiac pacemaker: Secondary | ICD-10-CM | POA: Diagnosis not present

## 2018-08-27 DIAGNOSIS — I480 Paroxysmal atrial fibrillation: Secondary | ICD-10-CM

## 2018-08-27 DIAGNOSIS — I10 Essential (primary) hypertension: Secondary | ICD-10-CM | POA: Diagnosis not present

## 2018-08-27 DIAGNOSIS — I495 Sick sinus syndrome: Secondary | ICD-10-CM

## 2018-08-27 DIAGNOSIS — E038 Other specified hypothyroidism: Secondary | ICD-10-CM

## 2018-08-27 NOTE — Patient Instructions (Addendum)
Medication Instructions:  Your physician recommends that you continue on your current medications as directed. Please refer to the Current Medication list given to you today.  Labwork: None ordered.  Testing/Procedures: None ordered.  Follow-Up: Your physician wants you to follow-up in: one year with EP APP.   You will receive a reminder letter in the mail two months in advance. If you don't receive a letter, please call our office to schedule the follow-up appointment.  Remote monitoring is used to monitor your Pacemaker from home. This monitoring reduces the number of office visits required to check your device to one time per year. It allows [place] to keep an eye on the functioning of your device to ensure it is working properly. You are scheduled for a device check from home on 11/26/2018. You may send your transmission at any time that day. If you have a wireless device, the transmission will be sent automatically. After your physician reviews your transmission, you will receive a postcard with your next transmission date.  Any Other Special Instructions Will Be Listed Below (If Applicable).  If you need a refill on your cardiac medications before your next appointment, please call your pharmacy.

## 2018-08-27 NOTE — Progress Notes (Signed)
PCP: Eustaquio Maize, MD Primary Cardiologist: Dr Gwenlyn Found Primary EP:  Dr Rayann Heman  [name] is a 85 y.o. female who presents today for routine electrophysiology followup.  Since her recent pacemaker implant, the patient reports doing very well.  She continues to have fatigue but is somewhat improved.  Presyncope has resolved.  Her husband feels that her memory is better. Today, she denies symptoms of palpitations,  lower extremity edema, dizziness, presyncope, or syncope. She continues to have atypical chest pain and shoulder pain.  The patient is otherwise without complaint today.   Past Medical History:  Diagnosis Date  . Acute respiratory failure with hypoxia ([place]) 10/23/2015  . Allergic rhinitis    PT. DENIES  . Anxiety   . Aortic insufficiency    Echo 04/29/2018: EF 65-70, mild AS (mean 13), mod AI, Asc Aorta 42 mm (mildly dilated), mild LAE, PASP 41, pericardium normal in appearance.   . Arthritis    NECK  . Ataxia   . Bradycardia    primarily nocturnal  . Burning tongue syndrome 25 years  . Cancer ([place]) dx'd 06/2016   liver  . Cataract   . Cerebellar degeneration ([place])   . Chronic urinary tract infection   . Complication of anesthesia    low o2 sats, coded 30 years ago  . CVA (cerebral infarction) 05/2003  . Depression   . Encounter for antineoplastic chemotherapy 10/09/2016  . Gait disorder   . Gastric polyps   . GERD (gastroesophageal reflux disease)   . Goals of care, counseling/discussion 10/09/2016  . High cholesterol   . History of pericarditis   . Hyperlipidemia   . Hypotension   . Hypothyroidism   . IBS (irritable bowel syndrome)   . Obesity   . Paroxysmal atrial fibrillation ([name])    chads2vasc score is 6,  she is felt to be a poor candidate for anticoagulation  . Pericarditis   . Personal history of arterial venous malformation (AVM)    right side of face  . Seizure disorder ([place])   . Seizures ([place]) 2003   " smelling"- Gabapentin "no problem"    . Shortness of breath dyspnea    with exertion  . Sick sinus syndrome ([place])   . Sternum fx 10/27/2013  . Stroke [facility]) 5 years ago   Right side of face weak, slurred speach-   . Thyroid disease   . TIA (transient ischemic attack)   . UTI (lower urinary tract infection) 03/27/2016   "frequently"   Past Surgical History:  Procedure Laterality Date  . APPENDECTOMY  84 years old  . COLONOSCOPY  2006, 2009  . COLONOSCOPY WITH PROPOFOL N/A 03/28/2016   Procedure: COLONOSCOPY WITH PROPOFOL;  Surgeon: Gatha Mayer, MD;  Location: [place];  Service: Endoscopy;  Laterality: N/A;  . CORONARY ANGIOGRAPHY N/A 05/22/2018   Procedure: CORONARY ANGIOGRAPHY (CATH LAB);  Surgeon: Lorretta Harp, MD;  Location: [place] CV LAB;  Service: Cardiovascular;  Laterality: N/A;  . cyst removed  35 years ago  . EP IMPLANTABLE DEVICE N/A 01/06/2015   Procedure: Loop Recorder Insertion;  Surgeon: Thompson Grayer, MD;  Location: [place] CV LAB;  Service: Cardiovascular;  Laterality: N/A;  . EYE SURGERY Right    Cataract  . IR ANGIOGRAM SELECTIVE EACH ADDITIONAL VESSEL  11/28/2016  . IR ANGIOGRAM SELECTIVE EACH ADDITIONAL VESSEL  11/28/2016  . IR ANGIOGRAM SELECTIVE EACH ADDITIONAL VESSEL  11/28/2016  . IR ANGIOGRAM SELECTIVE EACH ADDITIONAL VESSEL  11/28/2016  .  IR ANGIOGRAM SELECTIVE EACH ADDITIONAL VESSEL  12/13/2016  . IR ANGIOGRAM SELECTIVE EACH ADDITIONAL VESSEL  12/13/2016  . IR ANGIOGRAM SELECTIVE EACH ADDITIONAL VESSEL  01/09/2017  . IR ANGIOGRAM VISCERAL SELECTIVE  11/28/2016  . IR ANGIOGRAM VISCERAL SELECTIVE  11/28/2016  . IR ANGIOGRAM VISCERAL SELECTIVE  12/13/2016  . IR ANGIOGRAM VISCERAL SELECTIVE  12/13/2016  . IR ANGIOGRAM VISCERAL SELECTIVE  01/09/2017  . IR EMBO ARTERIAL NOT HEMORR HEMANG INC GUIDE ROADMAPPING  11/28/2016  . IR EMBO TUMOR ORGAN ISCHEMIA INFARCT INC GUIDE ROADMAPPING  12/13/2016  . IR EMBO TUMOR ORGAN ISCHEMIA INFARCT INC GUIDE ROADMAPPING  01/09/2017  . IR IVC FILTER PLMT / S&I /IMG  GUID/MOD SED  04/24/2017  . IR RADIOLOGIST EVAL & MGMT  11/06/2016  . IR RADIOLOGIST EVAL & MGMT  01/02/2017  . IR RADIOLOGIST EVAL & MGMT  02/05/2017  . IR RADIOLOGIST EVAL & MGMT  05/02/2017  . IR RADIOLOGIST EVAL & MGMT  10/17/2017  . IR US GUIDE VASC ACCESS RIGHT  11/28/2016  . IR US GUIDE VASC ACCESS RIGHT  12/13/2016  . IR US GUIDE VASC ACCESS RIGHT  01/09/2017  . KNEE ARTHROSCOPY Right 11/14/2006  . KNEE ARTHROSCOPY Bilateral 5 and 6 years ago  . KNEE ARTHROSCOPY WITH LATERAL MENISECTOMY  07/03/2012   Procedure: KNEE ARTHROSCOPY WITH LATERAL MENISECTOMY;  Surgeon: Magnus Sinning, MD;  Location: [facility];  Service: Orthopedics;  Laterality: Left;  with Partial Lateral Menisectomy and Medial Menisectomy. Shaving of medial and lateral femoral condyles. Shaving of patella. Removal of a loose body  . LOOP RECORDER REMOVAL N/A 05/22/2018   Procedure: LOOP RECORDER REMOVAL;  Surgeon: Thompson Grayer, MD;  Location: [place] CV LAB;  Service: Cardiovascular;  Laterality: N/A;  . PACEMAKER IMPLANT N/A 05/22/2018   Procedure: PACEMAKER IMPLANT;  Surgeon: Thompson Grayer, MD;  Location: [place] CV LAB;  Service: Cardiovascular;  Laterality: N/A;  . tibial and fibular internal fixation Left   . TOTAL ABDOMINAL HYSTERECTOMY  85 years old  . UPPER GASTROINTESTINAL ENDOSCOPY  2009, 2013    ROS- all systems are reviewed and negative except as per HPI above  Current Outpatient Medications  Medication Sig Dispense Refill  . ALPRAZolam (XANAX) 0.25 MG tablet Take 1 tablet (0.25 mg total) by mouth 2 (two) times daily as needed for anxiety. (Patient taking differently: Take 0.25 mg by mouth 2 (two) times daily. ) 30 tablet 1  . amitriptyline (ELAVIL) 25 MG tablet Take 1 tablet (25 mg total) by mouth at bedtime. 90 tablet 1  . amLODipine (NORVASC) 2.5 MG tablet Take 1 tablet (2.5 mg total) by mouth daily. 30 tablet 6  . capecitabine (XELODA) 500 MG tablet TAKE 3 TABLETS (1500MG ) BY MOUTH TWICE DAILY AFTER A  MEAL. TAKE ON DAYS 1-14 OF EACH 28 DAY CYCLE 84 tablet 2  . escitalopram (LEXAPRO) 20 MG tablet Take 1 tablet (20 mg total) by mouth at bedtime. 90 tablet 1  . fluticasone (FLONASE) 50 MCG/ACT nasal spray Place 2 sprays into both nostrils at bedtime. 16 g 3  . furosemide (LASIX) 20 MG tablet TAKE 1 TABLET DAILY AS NEEDED FOR FLUID RETENTION 90 tablet 0  . gabapentin (NEURONTIN) 400 MG capsule Take 1-2 capsules (400-800 mg total) by mouth 3 (three) times daily. (Patient taking differently: Take 800 mg by mouth 3 (three) times daily. ) 720 capsule 1  . hydroxypropyl methylcellulose / hypromellose (ISOPTO TEARS / GONIOVISC) 2.5 % ophthalmic solution Place 1 drop into both eyes daily.     Marland Kitchen  levothyroxine (SYNTHROID, LEVOTHROID) 75 MCG tablet TAKE 1 TABLET DAILY BEFORE BREAKFAST 90 tablet 3  . lovastatin (MEVACOR) 40 MG tablet Take 1 tablet (40 mg total) by mouth at bedtime. 90 tablet 1  . Melatonin 3 MG TBDP Take 3-6 mg by mouth at bedtime as needed. 60 tablet 1  . omeprazole (PRILOSEC) 40 MG capsule Take 1 capsule (40 mg total) by mouth daily. 90 capsule 1  . ondansetron (ZOFRAN) 8 MG tablet Take 1 tablet (8 mg total) by mouth every 8 (eight) hours as needed for nausea or vomiting. 90 tablet 1  . OXcarbazepine (TRILEPTAL) 150 MG tablet TAKE 1 TABLET BY MOUTH TWICE DAILY 60 tablet 5  . oxyCODONE-acetaminophen (PERCOCET) 5-325 MG tablet Take 1 tablet by mouth every 8 (eight) hours as needed for severe pain. 10 tablet 0  . temozolomide (TEMODAR) 140 MG capsule TAKE 2 CAPSULES (280MG ) BY MOUTH DAILY FOR 5 DAYS ON DAYS 10-14 OF EACH 28D CYCLE. MAY TAKE ON EMPTY STOMACH AT BEDTIME TO DECREASE NAUSEA A 10 capsule 2  . XARELTO 20 MG TABS tablet TAKE 1 TABLET DAILY WITH SUPPER 90 tablet 4   No current facility-administered medications for this visit.     Physical Exam: Vitals:   08/27/18 1519  BP: 134/68  Pulse: 66  SpO2: 98%  Weight: 225 lb (102.1 kg)  Height: 5\' 4"  (1.626 m)    GEN- The patient is  chronically ill and overweight appearing, alert and oriented x 3 today.   Head- normocephalic, atraumatic Eyes-  Sclera clear, conjunctiva pink Ears- hearing intact Oropharynx- clear Lungs- Clear to ausculation bilaterally, normal work of breathing Chest- pacemaker pocket is well healed Heart- Regular rate and rhythm, no murmurs, rubs or gallops, PMI not laterally displaced GI- soft, NT, ND, + BS Extremities- no clubbing, cyanosis, or edema  Pacemaker interrogation- reviewed in detail today,  See PACEART report  ekg tracing ordered today is personally reviewed and shows atrial paced rhythm  Assessment and Plan:  1. Symptomatic sinus bradycardia  Normal pacemaker function See Pace Art report No changes today Flat histograms but not very active  2. HTN Stable No change required today  3. Paroxysmal atrial fibrillation afib burden is 0%  4. Lung CAD Followed by DR [name]  5. Atypical chest pain Recent cath reviewed  carelink Return in a year to see EP PA  Thompson Grayer MD, [facility] 08/27/2018 3:34 PM

## 2018-08-28 LAB — CUP PACEART INCLINIC DEVICE CHECK
Battery Remaining Longevity: 148 mo
Battery Voltage: 3.18 V
Brady Statistic AP VP Percent: 0.19 %
Brady Statistic AP VS Percent: 66.1 %
Brady Statistic AS VP Percent: 0.02 %
Brady Statistic RA Percent Paced: 66.1 %
Brady Statistic RV Percent Paced: 0.21 %
Date Time Interrogation Session: 20200129202301
Implantable Lead Implant Date: 20191024
Implantable Lead Implant Date: 20191024
Implantable Lead Location: 753859
Implantable Lead Location: 753860
Implantable Lead Model: 5076
Implantable Lead Model: 5076
Implantable Pulse Generator Implant Date: 20191024
Lead Channel Impedance Value: 323 Ohm
Lead Channel Impedance Value: 361 Ohm
Lead Channel Impedance Value: 456 Ohm
Lead Channel Pacing Threshold Amplitude: 0.5 V
Lead Channel Pacing Threshold Pulse Width: 0.4 ms
Lead Channel Pacing Threshold Pulse Width: 0.4 ms
Lead Channel Sensing Intrinsic Amplitude: 1.25 mV
Lead Channel Sensing Intrinsic Amplitude: 1.625 mV
Lead Channel Sensing Intrinsic Amplitude: 16.625 mV
Lead Channel Setting Pacing Amplitude: 2.5 V
Lead Channel Setting Pacing Amplitude: 2.5 V
Lead Channel Setting Pacing Pulse Width: 0.4 ms
Lead Channel Setting Sensing Sensitivity: 1.2 mV
MDC IDC MSMT LEADCHNL RA IMPEDANCE VALUE: 380 Ohm
MDC IDC MSMT LEADCHNL RV PACING THRESHOLD AMPLITUDE: 0.625 V
MDC IDC MSMT LEADCHNL RV SENSING INTR AMPL: 12.375 mV
MDC IDC STAT BRADY AS VS PERCENT: 33.69 %

## 2018-09-01 ENCOUNTER — Telehealth: Payer: Self-pay | Admitting: Internal Medicine

## 2018-09-01 ENCOUNTER — Inpatient Hospital Stay (HOSPITAL_BASED_OUTPATIENT_CLINIC_OR_DEPARTMENT_OTHER): Payer: Medicare Other | Admitting: Internal Medicine

## 2018-09-01 ENCOUNTER — Inpatient Hospital Stay: Payer: Medicare Other | Attending: Internal Medicine

## 2018-09-01 ENCOUNTER — Encounter: Payer: Self-pay | Admitting: Internal Medicine

## 2018-09-01 VITALS — BP 145/53 | HR 89 | Temp 97.7°F | Resp 18 | Ht 64.0 in | Wt 218.9 lb

## 2018-09-01 DIAGNOSIS — I10 Essential (primary) hypertension: Secondary | ICD-10-CM

## 2018-09-01 DIAGNOSIS — Z7901 Long term (current) use of anticoagulants: Secondary | ICD-10-CM | POA: Diagnosis not present

## 2018-09-01 DIAGNOSIS — C7B02 Secondary carcinoid tumors of liver: Secondary | ICD-10-CM | POA: Diagnosis not present

## 2018-09-01 DIAGNOSIS — C7A8 Other malignant neuroendocrine tumors: Secondary | ICD-10-CM

## 2018-09-01 DIAGNOSIS — Z5111 Encounter for antineoplastic chemotherapy: Secondary | ICD-10-CM

## 2018-09-01 DIAGNOSIS — Z86711 Personal history of pulmonary embolism: Secondary | ICD-10-CM | POA: Insufficient documentation

## 2018-09-01 DIAGNOSIS — C787 Secondary malignant neoplasm of liver and intrahepatic bile duct: Secondary | ICD-10-CM

## 2018-09-01 DIAGNOSIS — Z79899 Other long term (current) drug therapy: Secondary | ICD-10-CM | POA: Diagnosis not present

## 2018-09-01 DIAGNOSIS — C7A090 Malignant carcinoid tumor of the bronchus and lung: Secondary | ICD-10-CM | POA: Diagnosis not present

## 2018-09-01 DIAGNOSIS — C7B8 Other secondary neuroendocrine tumors: Principal | ICD-10-CM

## 2018-09-01 DIAGNOSIS — C3492 Malignant neoplasm of unspecified part of left bronchus or lung: Secondary | ICD-10-CM

## 2018-09-01 LAB — CMP (CANCER CENTER ONLY)
ALT: 12 U/L (ref 0–44)
AST: 23 U/L (ref 15–41)
Albumin: 3.9 g/dL (ref 3.5–5.0)
Alkaline Phosphatase: 116 U/L (ref 38–126)
Anion gap: 7 (ref 5–15)
BUN: 14 mg/dL (ref 8–23)
CO2: 27 mmol/L (ref 22–32)
Calcium: 9.8 mg/dL (ref 8.9–10.3)
Chloride: 105 mmol/L (ref 98–111)
Creatinine: 0.82 mg/dL (ref 0.44–1.00)
GFR, Est AFR Am: >60 mL/min (ref >60)
GFR, Estimated: >60 mL/min (ref >60)
GLUCOSE: 93 mg/dL (ref 70–99)
Potassium: 4 mmol/L (ref 3.5–5.1)
Sodium: 139 mmol/L (ref 135–145)
Total Bilirubin: 0.8 mg/dL (ref 0.3–1.2)
Total Protein: 7 g/dL (ref 6.5–8.1)

## 2018-09-01 LAB — CBC WITH DIFFERENTIAL (CANCER CENTER ONLY)
Abs Immature Granulocytes: 0.01 10*3/uL (ref 0.00–0.07)
Basophils Absolute: 0 10*3/uL (ref 0.0–0.1)
Basophils Relative: 1 %
Eosinophils Absolute: 0.3 10*3/uL (ref 0.0–0.5)
Eosinophils Relative: 7 %
HEMATOCRIT: 36.6 % (ref 36.0–46.0)
HEMOGLOBIN: 11.9 g/dL — AB (ref 12.0–15.0)
Immature Granulocytes: 0 %
LYMPHS PCT: 20 %
Lymphs Abs: 0.8 10*3/uL (ref 0.7–4.0)
MCH: 33.6 pg (ref 26.0–34.0)
MCHC: 32.5 g/dL (ref 30.0–36.0)
MCV: 103.4 fL — AB (ref 80.0–100.0)
Monocytes Absolute: 0.5 10*3/uL (ref 0.1–1.0)
Monocytes Relative: 14 %
Neutro Abs: 2.1 10*3/uL (ref 1.7–7.7)
Neutrophils Relative %: 58 %
Platelet Count: 118 10*3/uL — ABNORMAL LOW (ref 150–400)
RBC: 3.54 MIL/uL — ABNORMAL LOW (ref 3.87–5.11)
RDW: 16 % — ABNORMAL HIGH (ref 11.5–15.5)
WBC Count: 3.7 10*3/uL — ABNORMAL LOW (ref 4.0–10.5)
nRBC: 0 % (ref 0.0–0.2)

## 2018-09-01 NOTE — Telephone Encounter (Signed)
Scheduled appt per 02/03 los.

## 2018-09-01 NOTE — Progress Notes (Signed)
[place] Telephone:([phone]) [phone]   Fax:([phone]) [phone]  OFFICE PROGRESS NOTE  [name], [place] [id number]  DIAGNOSIS:  1) Metastatic intermediate. Neuroendocrine tumor of lung primary diagnosed in January 2018 and presented with small bilateral pulmonary nodules in addition to multiple liver metastasis. 2) right lower lobe pulmonary embolism diagnosed incidentally on CT scan of the chest on 04/23/2017  PRIOR THERAPY:  1) Status post radio embolization with Y 90 to the liver lesions by interventional radiology. 2) status post IVC filter placement by interventional radiology on 04/24/2017  CURRENT THERAPY: Xeloda 750 MG/M2 twice a day days 1-14 and Temodar 150 MG/M2 days 10-14 every 4 weeks. Status post 26 cycles. She will start cycle #27 on August 24, 2018.  INTERVAL HISTORY: [name] 85 y.o. female returns to the clinic today for follow-up visit accompanied by her husband.  The patient is feeling fine today with no concerning complaints.  She denied having any chest pain, shortness of breath except with exertion with no cough or hemoptysis.  She continues to have mild fatigue.  She denied having any fever or chills.  She lost few pounds since her last visit.  She denied having any nausea, vomiting, diarrhea or constipation.  She denied having any headache or visual changes.  She is here today for evaluation and repeat blood work.  MEDICAL HISTORY: Past Medical History:  Diagnosis Date  . Acute respiratory failure with hypoxia ([place]) 10/23/2015  . Allergic rhinitis    PT. DENIES  . Anxiety   . Aortic insufficiency    Echo 04/29/2018: EF 65-70, mild AS (mean 13), mod AI, Asc Aorta 42 mm (mildly dilated), mild LAE, PASP 41, pericardium normal in appearance.   . Arthritis    NECK  . Ataxia   . Bradycardia    primarily nocturnal  . Burning tongue syndrome 25 years  . Cancer ([place]) dx'd 06/2016   liver  . Cataract   . Cerebellar  degeneration ([place])   . Chronic urinary tract infection   . Complication of anesthesia    low o2 sats, coded 30 years ago  . CVA (cerebral infarction) 05/2003  . Depression   . Encounter for antineoplastic chemotherapy 10/09/2016  . Gait disorder   . Gastric polyps   . GERD (gastroesophageal reflux disease)   . Goals of care, counseling/discussion 10/09/2016  . High cholesterol   . History of pericarditis   . Hyperlipidemia   . Hypotension   . Hypothyroidism   . IBS (irritable bowel syndrome)   . Obesity   . Paroxysmal atrial fibrillation ([name])    chads2vasc score is 6,  she is felt to be a poor candidate for anticoagulation  . Pericarditis   . Personal history of arterial venous malformation (AVM)    right side of face  . Seizure disorder ([place])   . Seizures ([place]) 2003   " smelling"- Gabapentin "no problem"  . Shortness of breath dyspnea    with exertion  . Sick sinus syndrome ([place])   . Sternum fx 10/27/2013  . Stroke [facility]) 5 years ago   Right side of face weak, slurred speach-   . Thyroid disease   . TIA (transient ischemic attack)   . UTI (lower urinary tract infection) 03/27/2016   "frequently"    ALLERGIES:  is allergic to lisinopril.  MEDICATIONS:  Current Outpatient Medications  Medication Sig Dispense Refill  . ALPRAZolam (XANAX) 0.25 MG tablet Take  1 tablet (0.25 mg total) by mouth 2 (two) times daily as needed for anxiety. (Patient taking differently: Take 0.25 mg by mouth 2 (two) times daily. ) 30 tablet 1  . amitriptyline (ELAVIL) 25 MG tablet Take 1 tablet (25 mg total) by mouth at bedtime. 90 tablet 1  . amLODipine (NORVASC) 2.5 MG tablet Take 1 tablet (2.5 mg total) by mouth daily. 30 tablet 6  . capecitabine (XELODA) 500 MG tablet TAKE 3 TABLETS (1500MG ) BY MOUTH TWICE DAILY AFTER A MEAL. TAKE ON DAYS 1-14 OF EACH 28 DAY CYCLE 84 tablet 2  . escitalopram (LEXAPRO) 20 MG tablet Take 1 tablet (20 mg total) by mouth at bedtime. 90 tablet 1  . fluticasone  (FLONASE) 50 MCG/ACT nasal spray Place 2 sprays into both nostrils at bedtime. 16 g 3  . furosemide (LASIX) 20 MG tablet TAKE 1 TABLET DAILY AS NEEDED FOR FLUID RETENTION 90 tablet 0  . gabapentin (NEURONTIN) 400 MG capsule Take 1-2 capsules (400-800 mg total) by mouth 3 (three) times daily. (Patient taking differently: Take 800 mg by mouth 3 (three) times daily. ) 720 capsule 1  . hydroxypropyl methylcellulose / hypromellose (ISOPTO TEARS / GONIOVISC) 2.5 % ophthalmic solution Place 1 drop into both eyes daily.     Marland Kitchen levothyroxine (SYNTHROID, LEVOTHROID) 75 MCG tablet TAKE 1 TABLET DAILY BEFORE BREAKFAST 90 tablet 3  . lovastatin (MEVACOR) 40 MG tablet Take 1 tablet (40 mg total) by mouth at bedtime. 90 tablet 1  . Melatonin 3 MG TBDP Take 3-6 mg by mouth at bedtime as needed. 60 tablet 1  . omeprazole (PRILOSEC) 40 MG capsule Take 1 capsule (40 mg total) by mouth daily. 90 capsule 1  . ondansetron (ZOFRAN) 8 MG tablet Take 1 tablet (8 mg total) by mouth every 8 (eight) hours as needed for nausea or vomiting. 90 tablet 1  . OXcarbazepine (TRILEPTAL) 150 MG tablet TAKE 1 TABLET BY MOUTH TWICE DAILY 60 tablet 5  . oxyCODONE-acetaminophen (PERCOCET) 5-325 MG tablet Take 1 tablet by mouth every 8 (eight) hours as needed for severe pain. 10 tablet 0  . temozolomide (TEMODAR) 140 MG capsule TAKE 2 CAPSULES (280MG ) BY MOUTH DAILY FOR 5 DAYS ON DAYS 10-14 OF EACH 28D CYCLE. MAY TAKE ON EMPTY STOMACH AT BEDTIME TO DECREASE NAUSEA A 10 capsule 2  . XARELTO 20 MG TABS tablet TAKE 1 TABLET DAILY WITH SUPPER 90 tablet 4   No current facility-administered medications for this visit.     SURGICAL HISTORY:  Past Surgical History:  Procedure Laterality Date  . APPENDECTOMY  85 years old  . COLONOSCOPY  2006, 2009  . COLONOSCOPY WITH PROPOFOL N/A 03/28/2016   Procedure: COLONOSCOPY WITH PROPOFOL;  Surgeon: Gatha Mayer, MD;  Location: [place];  Service: Endoscopy;  Laterality: N/A;  . CORONARY  ANGIOGRAPHY N/A 05/22/2018   Procedure: CORONARY ANGIOGRAPHY (CATH LAB);  Surgeon: Lorretta Harp, MD;  Location: [place] [facility];  Service: Cardiovascular;  Laterality: N/A;  . cyst removed  35 years ago  . EP IMPLANTABLE DEVICE N/A 01/06/2015   Procedure: Loop Recorder Insertion;  Surgeon: Thompson Grayer, MD;  Location: [place] [facility];  Service: Cardiovascular;  Laterality: N/A;  . EYE SURGERY Right    Cataract  . IR ANGIOGRAM SELECTIVE EACH ADDITIONAL VESSEL  11/28/2016  . IR ANGIOGRAM SELECTIVE EACH ADDITIONAL VESSEL  11/28/2016  . IR ANGIOGRAM SELECTIVE EACH ADDITIONAL VESSEL  11/28/2016  . IR ANGIOGRAM SELECTIVE EACH ADDITIONAL VESSEL  11/28/2016  .  IR ANGIOGRAM SELECTIVE EACH ADDITIONAL VESSEL  12/13/2016  . IR ANGIOGRAM SELECTIVE EACH ADDITIONAL VESSEL  12/13/2016  . IR ANGIOGRAM SELECTIVE EACH ADDITIONAL VESSEL  01/09/2017  . IR ANGIOGRAM VISCERAL SELECTIVE  11/28/2016  . IR ANGIOGRAM VISCERAL SELECTIVE  11/28/2016  . IR ANGIOGRAM VISCERAL SELECTIVE  12/13/2016  . IR ANGIOGRAM VISCERAL SELECTIVE  12/13/2016  . IR ANGIOGRAM VISCERAL SELECTIVE  01/09/2017  . IR EMBO ARTERIAL NOT HEMORR HEMANG INC GUIDE ROADMAPPING  11/28/2016  . IR EMBO TUMOR ORGAN ISCHEMIA INFARCT INC GUIDE ROADMAPPING  12/13/2016  . IR EMBO TUMOR ORGAN ISCHEMIA INFARCT INC GUIDE ROADMAPPING  01/09/2017  . IR IVC FILTER PLMT / S&I /IMG GUID/MOD SED  04/24/2017  . IR RADIOLOGIST EVAL & MGMT  11/06/2016  . IR RADIOLOGIST EVAL & MGMT  01/02/2017  . IR RADIOLOGIST EVAL & MGMT  02/05/2017  . IR RADIOLOGIST EVAL & MGMT  05/02/2017  . IR RADIOLOGIST EVAL & MGMT  10/17/2017  . IR US GUIDE VASC ACCESS RIGHT  11/28/2016  . IR US GUIDE VASC ACCESS RIGHT  12/13/2016  . IR US GUIDE VASC ACCESS RIGHT  01/09/2017  . KNEE ARTHROSCOPY Right 11/14/2006  . KNEE ARTHROSCOPY Bilateral 5 and 6 years ago  . KNEE ARTHROSCOPY WITH LATERAL MENISECTOMY  07/03/2012   Procedure: KNEE ARTHROSCOPY WITH LATERAL MENISECTOMY;  Surgeon: Magnus Sinning, MD;  Location:  [facility];  Service: Orthopedics;  Laterality: Left;  with Partial Lateral Menisectomy and Medial Menisectomy. Shaving of medial and lateral femoral condyles. Shaving of patella. Removal of a loose body  . LOOP RECORDER REMOVAL N/A 05/22/2018   Procedure: LOOP RECORDER REMOVAL;  Surgeon: Thompson Grayer, MD;  Location: [place] [facility];  Service: Cardiovascular;  Laterality: N/A;  . PACEMAKER IMPLANT N/A 05/22/2018   MDT Azure XT DR MRI implanted by Dr Rayann Heman for sick sinus syndrome  . tibial and fibular internal fixation Left   . TOTAL ABDOMINAL HYSTERECTOMY  85 years old  . UPPER GASTROINTESTINAL ENDOSCOPY  2009, 2013    REVIEW OF SYSTEMS:  A comprehensive review of systems was negative except for: Constitutional: positive for fatigue Respiratory: positive for dyspnea on exertion Musculoskeletal: positive for muscle weakness   PHYSICAL EXAMINATION: General appearance: alert, cooperative, fatigued and no distress Head: Normocephalic, without obvious abnormality, atraumatic Neck: no adenopathy, no JVD, supple, symmetrical, trachea midline and thyroid not enlarged, symmetric, no tenderness/mass/nodules Lymph nodes: Cervical, supraclavicular, and axillary nodes normal. Resp: clear to auscultation bilaterally Back: symmetric, no curvature. ROM normal. No CVA tenderness. Cardio: regular rate and rhythm, S1, S2 normal, no murmur, click, rub or gallop GI: soft, non-tender; bowel sounds normal; no masses,  no organomegaly Extremities: extremities normal, atraumatic, no cyanosis or edema  ECOG PERFORMANCE STATUS: 1 - Symptomatic but completely ambulatory  Blood pressure (!) 145/53, pulse 89, temperature 97.7 F (36.5 C), temperature source Oral, resp. rate 18, height 5\' 4"  (1.626 m), weight 218 lb 14.4 oz (99.3 kg), SpO2 96 %.  LABORATORY DATA: Lab Results  Component Value Date   WBC 3.7 (L) 09/01/2018   HGB 11.9 (L) 09/01/2018   HCT 36.6 09/01/2018   MCV 103.4 (H) 09/01/2018   PLT 118 (L)  09/01/2018      Chemistry      Component Value Date/Time   NA 139 07/18/2018 1215   NA 134 05/17/2018 1151   NA 135 (L) 07/10/2017 1142   K 4.7 07/18/2018 1215   K 4.6 07/10/2017 1142   CL 106 07/18/2018 1215  CO2 24 07/18/2018 1215   CO2 25 07/10/2017 1142   BUN 10 07/18/2018 1215   BUN 10 05/17/2018 1151   BUN 11.4 07/10/2017 1142   CREATININE 0.84 07/18/2018 1215   CREATININE 1.0 07/10/2017 1142      Component Value Date/Time   CALCIUM 9.6 07/18/2018 1215   CALCIUM 9.9 07/10/2017 1142   ALKPHOS 114 07/18/2018 1215   ALKPHOS 93 07/10/2017 1142   AST 24 07/18/2018 1215   AST 43 (H) 07/10/2017 1142   ALT 13 07/18/2018 1215   ALT 28 07/10/2017 1142   BILITOT 0.5 07/18/2018 1215   BILITOT 0.56 07/10/2017 1142       RADIOGRAPHIC STUDIES: No results found. Enhancing dural based mass along the right tentorium ASSESSMENT AND PLAN:  This is a very pleasant 85 years old white female with metastatic intermediate grade neuroendocrine carcinoma of questionable lung primary and multiple metastatic liver lesions and pancreatic lesions.She status post treatment with radio embolization with Y90 to the left and right lobe liver lesions.Status post treatment with Y 90 to the left and right lobes of the liver. She is currently undergoing systemic chemotherapy with Xeloda and Temodar status post 26 cycles. She has been tolerating this treatment well.  She started cycle #27 on August 24, 2018. I recommended for the patient to continue her current treatment with Xeloda and Temodar with the same dose. I will see her back for follow-up visit in 1 months for evaluation and repeat blood work. The patient was advised to call immediately if she has any concerning symptoms in the interval. The patient voices understanding of current disease status and treatment options and is in agreement with the current care plan. All questions were answered. The patient knows to call the clinic with any  problems, questions or concerns. We can certainly see the patient much sooner if necessary.  Disclaimer: This note was dictated with voice recognition software. Similar sounding words can inadvertently be transcribed and may not be corrected upon review.

## 2018-09-02 ENCOUNTER — Other Ambulatory Visit: Payer: Self-pay | Admitting: *Deleted

## 2018-09-04 ENCOUNTER — Encounter

## 2018-09-04 ENCOUNTER — Ambulatory Visit (INDEPENDENT_AMBULATORY_CARE_PROVIDER_SITE_OTHER): Payer: Medicare Other | Admitting: Neurology

## 2018-09-04 ENCOUNTER — Encounter: Payer: Self-pay | Admitting: Neurology

## 2018-09-04 VITALS — BP 112/56 | HR 60 | Ht 64.0 in | Wt 225.0 lb

## 2018-09-04 DIAGNOSIS — G118 Other hereditary ataxias: Secondary | ICD-10-CM

## 2018-09-04 DIAGNOSIS — D320 Benign neoplasm of cerebral meninges: Secondary | ICD-10-CM

## 2018-09-04 DIAGNOSIS — G500 Trigeminal neuralgia: Secondary | ICD-10-CM | POA: Diagnosis not present

## 2018-09-04 MED ORDER — OXCARBAZEPINE 150 MG PO TABS: 150.0000 mg | ORAL_TABLET | Freq: Two times a day (BID) | ORAL | 3 refills | Status: DC

## 2018-09-04 NOTE — Patient Instructions (Signed)
1.  We will repeat CT of head with and without contrast in one year. 2.  Continue oxcarbazepine 150mg  twice daily 3.  Follow up in 6 months

## 2018-09-04 NOTE — Progress Notes (Signed)
NEUROLOGY FOLLOW UP OFFICE NOTE  LACHELL ROCHETTE [id number]  HISTORY OF PRESENT ILLNESS: [name] is an 85 year old right-handed Caucasian woman with cerebellar degeneration, metastatic intermediate grade neuroendocrine carcinoma, hypertension, paroxysmal atrial fibrillation, IBS, hypothyroidism and history of TIA and simple partial seizures who follows up for left-sided trigeminal neuralgia.  She is accompanied by her husband who supplements history.  UPDATE: Current medication: Oxcarbazepine 150 mg twice daily; gabapentin 800 mg/400 mg / 800 mg for seizure prophylaxis.  Amitriptyline 25 mg at bedtime for burning tongue syndrome.  No recurrent facial pain.  Repeat CT of head with and without contrast from 07/18/18 was personally reviewed and demonstrated known meningioma along right tentorium with interval growth compared to prior imaging from December 2017 (11 x 14 x 20 mm compared to 7 x 9 x 15 mm in 2017).  09/01/2018 labs: CMP with NA 139, K4, CL 105, CO2 27, glucose 93, BUN 14, CR 0.82, T bili 0.8, ALP 116, AST 23, ALT 12; CBC with WBC 3.7, Hgb 11.9, HCT 36.9, PLT 118, MCV 103.4.  HISTORY:  She reports history of an AVM on the right side of her face, diagnosed many years ago and therefore cannot use blood thinners.  However, prior MRA of the head and neck did not reveal any AVM.  She also reports pain from the right side of her jaw radiating up to the right temple since 2017.  In 2018, she developed pain around her left eye which would hurt whenever she moved her eye.  It eventually resolved.  She then developed a severe aching pain at her left TMJ which radiates up into her temple which occur daily, lasting a few seconds and occurring intermittent throughout the day.  It may occur spontaneously but also is aggravated by exertion but not talking, chewing or brushing her teeth.  Sed rate from 06/26/2017 was 34.  PAST MEDICAL HISTORY: Past Medical History:  Diagnosis Date  .  Acute respiratory failure with hypoxia ([place]) 10/23/2015  . Allergic rhinitis    PT. DENIES  . Anxiety   . Aortic insufficiency    Echo 04/29/2018: EF 65-70, mild AS (mean 13), mod AI, Asc Aorta 42 mm (mildly dilated), mild LAE, PASP 41, pericardium normal in appearance.   . Arthritis    NECK  . Ataxia   . Bradycardia    primarily nocturnal  . Burning tongue syndrome 25 years  . Cancer ([place]) dx'd 06/2016   liver  . Cataract   . Cerebellar degeneration ([place])   . Chronic urinary tract infection   . Complication of anesthesia    low o2 sats, coded 30 years ago  . CVA (cerebral infarction) 05/2003  . Depression   . Encounter for antineoplastic chemotherapy 10/09/2016  . Gait disorder   . Gastric polyps   . GERD (gastroesophageal reflux disease)   . Goals of care, counseling/discussion 10/09/2016  . High cholesterol   . History of pericarditis   . Hyperlipidemia   . Hypotension   . Hypothyroidism   . IBS (irritable bowel syndrome)   . Obesity   . Paroxysmal atrial fibrillation ([name])    chads2vasc score is 6,  she is felt to be a poor candidate for anticoagulation  . Pericarditis   . Personal history of arterial venous malformation (AVM)    right side of face  . Seizure disorder ([place])   . Seizures ([place]) 2003   " smelling"- Gabapentin "no problem"  . Shortness of breath dyspnea  with exertion  . Sick sinus syndrome ([place])   . Sternum fx 10/27/2013  . Stroke [facility]) 5 years ago   Right side of face weak, slurred speach-   . Thyroid disease   . TIA (transient ischemic attack)   . UTI (lower urinary tract infection) 03/27/2016   "frequently"    MEDICATIONS: Current Outpatient Medications on File Prior to Visit  Medication Sig Dispense Refill  . ALPRAZolam (XANAX) 0.25 MG tablet Take 1 tablet (0.25 mg total) by mouth 2 (two) times daily as needed for anxiety. (Patient taking differently: Take 0.25 mg by mouth 2 (two) times daily. ) 30 tablet 1  . amitriptyline (ELAVIL) 25 MG  tablet Take 1 tablet (25 mg total) by mouth at bedtime. 90 tablet 1  . amLODipine (NORVASC) 2.5 MG tablet Take 1 tablet (2.5 mg total) by mouth daily. 30 tablet 6  . capecitabine (XELODA) 500 MG tablet TAKE 3 TABLETS (1500MG ) BY MOUTH TWICE DAILY AFTER A MEAL. TAKE ON DAYS 1-14 OF EACH 28 DAY CYCLE 84 tablet 2  . escitalopram (LEXAPRO) 20 MG tablet Take 1 tablet (20 mg total) by mouth at bedtime. 90 tablet 1  . fluticasone (FLONASE) 50 MCG/ACT nasal spray Place 2 sprays into both nostrils at bedtime. 16 g 3  . furosemide (LASIX) 20 MG tablet TAKE 1 TABLET DAILY AS NEEDED FOR FLUID RETENTION 90 tablet 0  . gabapentin (NEURONTIN) 400 MG capsule Take 1-2 capsules (400-800 mg total) by mouth 3 (three) times daily. (Patient taking differently: Take 800 mg by mouth 3 (three) times daily. ) 720 capsule 1  . hydroxypropyl methylcellulose / hypromellose (ISOPTO TEARS / GONIOVISC) 2.5 % ophthalmic solution Place 1 drop into both eyes daily.     Marland Kitchen levothyroxine (SYNTHROID, LEVOTHROID) 75 MCG tablet TAKE 1 TABLET DAILY BEFORE BREAKFAST 90 tablet 3  . lovastatin (MEVACOR) 40 MG tablet Take 1 tablet (40 mg total) by mouth at bedtime. 90 tablet 1  . Melatonin 3 MG TBDP Take 3-6 mg by mouth at bedtime as needed. 60 tablet 1  . omeprazole (PRILOSEC) 40 MG capsule Take 1 capsule (40 mg total) by mouth daily. 90 capsule 1  . ondansetron (ZOFRAN) 8 MG tablet Take 1 tablet (8 mg total) by mouth every 8 (eight) hours as needed for nausea or vomiting. 90 tablet 1  . OXcarbazepine (TRILEPTAL) 150 MG tablet TAKE 1 TABLET BY MOUTH TWICE DAILY 60 tablet 5  . oxyCODONE-acetaminophen (PERCOCET) 5-325 MG tablet Take 1 tablet by mouth every 8 (eight) hours as needed for severe pain. 10 tablet 0  . temozolomide (TEMODAR) 140 MG capsule TAKE 2 CAPSULES (280MG ) BY MOUTH DAILY FOR 5 DAYS ON DAYS 10-14 OF EACH 28D CYCLE. MAY TAKE ON EMPTY STOMACH AT BEDTIME TO DECREASE NAUSEA A 10 capsule 2  . XARELTO 20 MG TABS tablet TAKE 1 TABLET  DAILY WITH SUPPER 90 tablet 4   No current facility-administered medications on file prior to visit.     ALLERGIES: Allergies  Allergen Reactions  . Lisinopril Cough    FAMILY HISTORY: Family History  Problem Relation Age of Onset  . Heart attack Father 37       fatal  . Coronary artery disease Brother   . Diabetes Brother   . Prostate cancer Brother   . Prostate cancer Son    SOCIAL HISTORY: Social History   Socioeconomic History  . Marital status: Married    Spouse name: Margareth Kanner  . Number of children: 6  . Years  of education: Not on file  . Highest education level: 10th grade  Occupational History  . Occupation: Agricultural engineer  Social Needs  . Financial resource strain: Not very hard  . Food insecurity:    Worry: Never true    Inability: Never true  . Transportation needs:    Medical: No    Non-medical: No  Tobacco Use  . Smoking status: Former Smoker    Packs/day: 0.25    Years: 10.00    Pack years: 2.50    Types: Cigarettes    Last attempt to quit: 07/31/1975    Years since quitting: 43.1  . Smokeless tobacco: Never Used  Substance and Sexual Activity  . Alcohol use: No    Comment: Rare- maybe a drink ever 2 years  . Drug use: No  . Sexual activity: Not Currently  Lifestyle  . Physical activity:    Days per week: 0 days    Minutes per session: 0 min  . Stress: Not at all  Relationships  . Social connections:    Talks on phone: More than three times a week    Gets together: More than three times a week    Attends religious service: Never    Active member of club or organization: No    Attends meetings of clubs or organizations: Never    Relationship status: Married  . Intimate partner violence:    Fear of current or ex partner: No    Emotionally abused: No    Physically abused: No    Forced sexual activity: No  Other Topics Concern  . Not on file  Social History Narrative   Lives with husband, does have stairs, does not use them. Pt  completed 10th grade.    REVIEW OF SYSTEMS: Constitutional: No fevers, chills, or sweats, no generalized fatigue, change in appetite Eyes: No visual changes, double vision, eye pain Ear, nose and throat: No hearing loss, ear pain, nasal congestion, sore throat Cardiovascular: No chest pain, palpitations Respiratory:  No shortness of breath at rest or with exertion, wheezes GastrointestinaI: No nausea, vomiting, diarrhea, abdominal pain, fecal incontinence Genitourinary:  No dysuria, urinary retention or frequency Musculoskeletal:  No neck pain, back pain Integumentary: No rash, pruritus, skin lesions Neurological: as above Psychiatric: No depression, insomnia, anxiety Endocrine: No palpitations, fatigue, diaphoresis, mood swings, change in appetite, change in weight, increased thirst Hematologic/Lymphatic:  No purpura, petechiae. Allergic/Immunologic: no itchy/runny eyes, nasal congestion, recent allergic reactions, rashes  PHYSICAL EXAM: Blood pressure (!) 112/56, pulse 60, height 5\' 4"  (1.626 m), weight 225 lb (102.1 kg), SpO2 92 %. General: No acute distress.  Patient appears well-groomed.  Head:  Normocephalic/atraumatic Eyes:  Fundi examined but not visualized Neck: supple, no paraspinal tenderness, full range of motion Heart:  Regular rate and rhythm Lungs:  Clear to auscultation bilaterally Back: No paraspinal tenderness Neurological Exam: alert and oriented to person, place, and time. Attention span and concentration intact, recent and remote memory intact, fund of knowledge intact.  Speech fluent and not dysarthric, language intact.  CN II-XII intact. Bulk and tone normal, muscle strength 4+/5 upper extremities, 3+/5 lower extremities..  Sensation to light touch intact.  Deep tendon reflexes 2+ throughout.  Finger to nose testing intact.  In wheelchair.  Unable to ambulate.  IMPRESSION: 1.  Left-sided trigeminal neuralgia, stable 2.  Cerebral meningioma, interval growth but  does not look significant.  Not symptomatic.  I don't think it requires surgical intervention.  Favor monitoring.  PLAN: 1.  Oxcarbazepine 150mg   twice daily 2.  Repeat CT head with and without contrast in one year. 3.  Follow up in 6 months.  25 minutes spent face to face with patient, over 50% spent discussing CT results and management.  Metta Clines, DO  CC: Assunta Found, MD

## 2018-09-05 ENCOUNTER — Encounter: Payer: Self-pay | Admitting: *Deleted

## 2018-09-05 NOTE — Patient Outreach (Signed)
[facility]) Care Management  09/05/2018  KINSLY HILD 09/01/1933 [id number]  [place] Visit done on 09/02/2018 and case closure.  Mrs. Zettlemoyer is sitting at the dining room table with her daughter, Juliann Pulse, this morning. They both report that Mrs. Vitiello has had an impressive turn around. She is able to get up with less assistance. She even will go to the bathroom herself and transfer from her WC to the commode and back. She is very cautious when doing this and will most the time have supervision which I would prefer.  Mr. Chiu arrives after grocery shopping and he also gives a positive comment on Mrs. Kimmer mobility and function to date.  They will be following up with neurology about her "benign brain tumor."  At this time they no longer require oversight from me. I am closing her case. I have advised that I am available if she needs me in the furture.  BP (!) 130/56   Pulse 60   Resp 18   SpO2 96%   RRR Lungs are clear Extremities: LU - healing skin tear from bumping arm against door frame. Presently approximately 2 cm X 2 CM and granulating in. No odor.  Advised to continue wound care daily: wash with saline or dial soap and water, pat dry, apply small amount of neoporin and cover with a gauze pad. Wrap arm with conform to prevent skin break down due to paper tape.  Eulah Pont. Myrtie Neither, MSN, [facility] Gerontological Nurse Practitioner [facility] Care Management [phone]

## 2018-09-18 MED FILL — CAPECITABINE 500 MG TABS: 500 | 14 days supply | Qty: 84 | Fill #2

## 2018-09-18 MED FILL — TEMOZOLOMIDE 140 MG CAPS: 140 | 28 days supply | Qty: 10 | Fill #2

## 2018-09-30 ENCOUNTER — Encounter: Payer: Self-pay | Admitting: Physician Assistant

## 2018-09-30 ENCOUNTER — Other Ambulatory Visit: Payer: Self-pay

## 2018-09-30 ENCOUNTER — Inpatient Hospital Stay: Payer: Medicare Other | Attending: Internal Medicine

## 2018-09-30 ENCOUNTER — Inpatient Hospital Stay (HOSPITAL_BASED_OUTPATIENT_CLINIC_OR_DEPARTMENT_OTHER): Payer: Medicare Other | Admitting: Physician Assistant

## 2018-09-30 VITALS — BP 146/55 | HR 59 | Temp 98.3°F | Resp 20 | Ht 64.0 in | Wt 217.8 lb

## 2018-09-30 DIAGNOSIS — C7B02 Secondary carcinoid tumors of liver: Secondary | ICD-10-CM | POA: Insufficient documentation

## 2018-09-30 DIAGNOSIS — Z86711 Personal history of pulmonary embolism: Secondary | ICD-10-CM | POA: Diagnosis not present

## 2018-09-30 DIAGNOSIS — C7A8 Other malignant neuroendocrine tumors: Secondary | ICD-10-CM

## 2018-09-30 DIAGNOSIS — C7B8 Other secondary neuroendocrine tumors: Secondary | ICD-10-CM

## 2018-09-30 DIAGNOSIS — Z7901 Long term (current) use of anticoagulants: Secondary | ICD-10-CM | POA: Diagnosis not present

## 2018-09-30 DIAGNOSIS — Z5111 Encounter for antineoplastic chemotherapy: Secondary | ICD-10-CM

## 2018-09-30 DIAGNOSIS — Z79899 Other long term (current) drug therapy: Secondary | ICD-10-CM | POA: Insufficient documentation

## 2018-09-30 DIAGNOSIS — C7A090 Malignant carcinoid tumor of the bronchus and lung: Secondary | ICD-10-CM | POA: Insufficient documentation

## 2018-09-30 LAB — CMP (CANCER CENTER ONLY)
ALT: 11 U/L (ref 0–44)
AST: 24 U/L (ref 15–41)
Albumin: 3.7 g/dL (ref 3.5–5.0)
Alkaline Phosphatase: 102 U/L (ref 38–126)
Anion gap: 9 (ref 5–15)
BUN: 14 mg/dL (ref 8–23)
CO2: 25 mmol/L (ref 22–32)
Calcium: 9.4 mg/dL (ref 8.9–10.3)
Chloride: 106 mmol/L (ref 98–111)
Creatinine: 0.83 mg/dL (ref 0.44–1.00)
GFR, Estimated: >60 mL/min (ref >60)
Glucose, Bld: 118 mg/dL — ABNORMAL HIGH (ref 70–99)
Potassium: 4.1 mmol/L (ref 3.5–5.1)
Sodium: 140 mmol/L (ref 135–145)
Total Bilirubin: 0.5 mg/dL (ref 0.3–1.2)
Total Protein: 6.9 g/dL (ref 6.5–8.1)

## 2018-09-30 LAB — CBC WITH DIFFERENTIAL (CANCER CENTER ONLY)
Abs Immature Granulocytes: 0.01 10*3/uL (ref 0.00–0.07)
Basophils Absolute: 0 10*3/uL (ref 0.0–0.1)
Basophils Relative: 1 %
Eosinophils Absolute: 0.3 10*3/uL (ref 0.0–0.5)
Eosinophils Relative: 9 %
HCT: 36.9 % (ref 36.0–46.0)
Hemoglobin: 11.9 g/dL — ABNORMAL LOW (ref 12.0–15.0)
Immature Granulocytes: 0 %
LYMPHS ABS: 0.8 10*3/uL (ref 0.7–4.0)
Lymphocytes Relative: 25 %
MCH: 33.3 pg (ref 26.0–34.0)
MCHC: 32.2 g/dL (ref 30.0–36.0)
MCV: 103.4 fL — ABNORMAL HIGH (ref 80.0–100.0)
Monocytes Absolute: 0.6 10*3/uL (ref 0.1–1.0)
Monocytes Relative: 16 %
NRBC: 0 % (ref 0.0–0.2)
Neutro Abs: 1.7 10*3/uL (ref 1.7–7.7)
Neutrophils Relative %: 49 %
Platelet Count: 114 10*3/uL — ABNORMAL LOW (ref 150–400)
RBC: 3.57 MIL/uL — ABNORMAL LOW (ref 3.87–5.11)
RDW: 16.2 % — ABNORMAL HIGH (ref 11.5–15.5)
WBC Count: 3.4 10*3/uL — ABNORMAL LOW (ref 4.0–10.5)

## 2018-09-30 MED ORDER — TEMOZOLOMIDE 140 MG PO CAPS: ORAL_CAPSULE | ORAL | 2 refills | Status: DC

## 2018-09-30 MED ORDER — CAPECITABINE 500 MG PO TABS: ORAL_TABLET | ORAL | 2 refills | Status: DC

## 2018-09-30 NOTE — Progress Notes (Signed)
[facility] OFFICE PROGRESS NOTE  [name], [place] [address]  DIAGNOSIS: 1) Metastatic intermediate. Neuroendocrine tumor of lung primary diagnosed in January 2018 and presented with small bilateral pulmonary nodules in addition to multiple liver metastasis. 2) right lower lobe pulmonary embolism diagnosed incidentally on CT scan of the chest on 04/23/2017  PRIOR THERAPY:  1) Status post radio embolization with Y 90 to the liver lesions by interventional radiology. 2) status post IVC filter placement by interventional radiology on 04/24/2017  CURRENT THERAPY: Xeloda 750 MG/M2 twice a day days 1-14 and Temodar 150 MG/M2 days 10-14 every 4 weeks. Status post 27 cycles. She has started cycle #28 on February 24th, 2020.   INTERVAL HISTORY: BRAILEE RIEDE 85 y.o. female returns to clinic today accompanied by her husband.  The patient is tolerating her treatment well without any adverse effects except for some nausea and fatigue.  The patient manages her nausea with Zofran successfully.   The patient is currently taking Xarelto.  Patient expresses concern for an episode of lower left abdominal pain last Friday. The patient states that she had a black bowel movement with possible dark clots of blood associated with this LLQ abdominal pain. She is unable to recall whether she ate any food that could be attributable to the dark bowel movement. The patient has had not had any abdominal pain since this event and denies any more evidence of dark stools or any evidence of hematochezia. She denies any history of GI disease. She had an episode of epistaxis which was self limiting last week as well. She denies any other associated events of abdominal pain, melena, epistaxis, hematochezia, hemoptysis, gingival bleeding, or bleeding or bruising. The patient states this has happened to her once before several years ago in which she received stool cards which proved to be  negative for occult blood.  The patient also endorses an 8 pound weight loss since her last visit.  The patient states that she continues to eat 3 meals a day. She attributes her weight loss to eating healthier. Otherwise the patient denies any fevers, chills, or night sweats.  She denies any chest pain, cough, or hemoptysis.  She reports increased shortness of breath secondary to orthopnea and mild increase in peripheral edema for which she is on lasix, wears compression stockings, and elevates her legs.  The patient follows with Dr. Gwenlyn Found from cardiology.  She denies vomiting, diarrhea or constipation.  She denies any headache or visual changes.  She denies any skin changes or rashes but does endorse some mild pruritus.  She is here today for evaluation and repeat blood work.  MEDICAL HISTORY: Past Medical History:  Diagnosis Date  . Acute respiratory failure with hypoxia ([facility]) 10/23/2015  . Allergic rhinitis    PT. DENIES  . Anxiety   . Aortic insufficiency    Echo 04/29/2018: EF 65-70, mild AS (mean 13), mod AI, Asc Aorta 42 mm (mildly dilated), mild LAE, PASP 41, pericardium normal in appearance.   . Arthritis    NECK  . Ataxia   . Bradycardia    primarily nocturnal  . Burning tongue syndrome 25 years  . Cancer ([facility]) dx'd 06/2016   liver  . Cataract   . Cerebellar degeneration ([facility])   . Chronic urinary tract infection   . Complication of anesthesia    low o2 sats, coded 30 years ago  . CVA (cerebral infarction) 05/2003  . Depression   .  Encounter for antineoplastic chemotherapy 10/09/2016  . Gait disorder   . Gastric polyps   . GERD (gastroesophageal reflux disease)   . Goals of care, counseling/discussion 10/09/2016  . High cholesterol   . History of pericarditis   . Hyperlipidemia   . Hypotension   . Hypothyroidism   . IBS (irritable bowel syndrome)   . Obesity   . Paroxysmal atrial fibrillation ([name])    chads2vasc score is 6,  she is felt to be a poor candidate for  anticoagulation  . Pericarditis   . Personal history of arterial venous malformation (AVM)    right side of face  . Seizure disorder ([facility])   . Seizures ([facility]) 2003   " smelling"- Gabapentin "no problem"  . Shortness of breath dyspnea    with exertion  . Sick sinus syndrome ([facility])   . Sternum fx 10/27/2013  . Stroke [facility]) 5 years ago   Right side of face weak, slurred speach-   . Thyroid disease   . TIA (transient ischemic attack)   . UTI (lower urinary tract infection) 03/27/2016   "frequently"    ALLERGIES:  is allergic to lisinopril.  MEDICATIONS:  Current Outpatient Medications  Medication Sig Dispense Refill  . amitriptyline (ELAVIL) 25 MG tablet Take 1 tablet (25 mg total) by mouth at bedtime. 90 tablet 1  . amLODipine (NORVASC) 2.5 MG tablet Take 1 tablet (2.5 mg total) by mouth daily. 30 tablet 6  . capecitabine (XELODA) 500 MG tablet TAKE 3 TABLETS (1500MG ) BY MOUTH TWICE DAILY AFTER A MEAL. TAKE ON DAYS 1-14 OF EACH 28 DAY CYCLE 84 tablet 2  . escitalopram (LEXAPRO) 20 MG tablet Take 1 tablet (20 mg total) by mouth at bedtime. 90 tablet 1  . fluticasone (FLONASE) 50 MCG/ACT nasal spray Place 2 sprays into both nostrils at bedtime. 16 g 3  . furosemide (LASIX) 20 MG tablet TAKE 1 TABLET DAILY AS NEEDED FOR FLUID RETENTION 90 tablet 0  . gabapentin (NEURONTIN) 400 MG capsule Take 1-2 capsules (400-800 mg total) by mouth 3 (three) times daily. (Patient taking differently: Take 800 mg by mouth 3 (three) times daily. ) 720 capsule 1  . hydroxypropyl methylcellulose / hypromellose (ISOPTO TEARS / GONIOVISC) 2.5 % ophthalmic solution Place 1 drop into both eyes daily.     Marland Kitchen levothyroxine (SYNTHROID, LEVOTHROID) 75 MCG tablet TAKE 1 TABLET DAILY BEFORE BREAKFAST 90 tablet 3  . lovastatin (MEVACOR) 40 MG tablet Take 1 tablet (40 mg total) by mouth at bedtime. 90 tablet 1  . Melatonin 3 MG TBDP Take 3-6 mg by mouth at bedtime as needed. 60 tablet 1  . omeprazole (PRILOSEC) 40 MG  capsule Take 1 capsule (40 mg total) by mouth daily. 90 capsule 1  . ondansetron (ZOFRAN) 8 MG tablet Take 1 tablet (8 mg total) by mouth every 8 (eight) hours as needed for nausea or vomiting. 90 tablet 1  . OXcarbazepine (TRILEPTAL) 150 MG tablet Take 1 tablet (150 mg total) by mouth 2 (two) times daily. 180 tablet 3  . oxyCODONE-acetaminophen (PERCOCET) 5-325 MG tablet Take 1 tablet by mouth every 8 (eight) hours as needed for severe pain. 10 tablet 0  . temozolomide (TEMODAR) 140 MG capsule TAKE 2 CAPSULES (280MG ) BY MOUTH DAILY FOR 5 DAYS ON DAYS 10-14 OF EACH 28D CYCLE. MAY TAKE ON EMPTY STOMACH AT BEDTIME TO DECREASE NAUSEA A 10 capsule 2  . XARELTO 20 MG TABS tablet TAKE 1 TABLET DAILY WITH SUPPER 90 tablet 4  .  ALPRAZolam (XANAX) 0.25 MG tablet Take 1 tablet (0.25 mg total) by mouth 2 (two) times daily as needed for anxiety. (Patient not taking: Reported on 09/30/2018) 30 tablet 1   No current facility-administered medications for this visit.     SURGICAL HISTORY:  Past Surgical History:  Procedure Laterality Date  . APPENDECTOMY  85 years old  . COLONOSCOPY  2006, 2009  . COLONOSCOPY WITH PROPOFOL N/A 03/28/2016   Procedure: COLONOSCOPY WITH PROPOFOL;  Surgeon: Gatha Mayer, MD;  Location: [facility];  Service: Endoscopy;  Laterality: N/A;  . CORONARY ANGIOGRAPHY N/A 05/22/2018   Procedure: CORONARY ANGIOGRAPHY (CATH LAB);  Surgeon: Lorretta Harp, MD;  Location: [facility];  Service: Cardiovascular;  Laterality: N/A;  . cyst removed  35 years ago  . EP IMPLANTABLE DEVICE N/A 01/06/2015   Procedure: Loop Recorder Insertion;  Surgeon: Thompson Grayer, MD;  Location: [facility] CV LAB;  Service: Cardiovascular;  Laterality: N/A;  . EYE SURGERY Right    Cataract  . IR ANGIOGRAM SELECTIVE EACH ADDITIONAL VESSEL  11/28/2016  . IR ANGIOGRAM SELECTIVE EACH ADDITIONAL VESSEL  11/28/2016  . IR ANGIOGRAM SELECTIVE EACH ADDITIONAL VESSEL  11/28/2016  . IR ANGIOGRAM SELECTIVE EACH  ADDITIONAL VESSEL  11/28/2016  . IR ANGIOGRAM SELECTIVE EACH ADDITIONAL VESSEL  12/13/2016  . IR ANGIOGRAM SELECTIVE EACH ADDITIONAL VESSEL  12/13/2016  . IR ANGIOGRAM SELECTIVE EACH ADDITIONAL VESSEL  01/09/2017  . IR ANGIOGRAM VISCERAL SELECTIVE  11/28/2016  . IR ANGIOGRAM VISCERAL SELECTIVE  11/28/2016  . IR ANGIOGRAM VISCERAL SELECTIVE  12/13/2016  . IR ANGIOGRAM VISCERAL SELECTIVE  12/13/2016  . IR ANGIOGRAM VISCERAL SELECTIVE  01/09/2017  . IR EMBO ARTERIAL NOT HEMORR HEMANG INC GUIDE ROADMAPPING  11/28/2016  . IR EMBO TUMOR ORGAN ISCHEMIA INFARCT INC GUIDE ROADMAPPING  12/13/2016  . IR EMBO TUMOR ORGAN ISCHEMIA INFARCT INC GUIDE ROADMAPPING  01/09/2017  . IR IVC FILTER PLMT / S&I /IMG GUID/MOD SED  04/24/2017  . IR RADIOLOGIST EVAL & MGMT  11/06/2016  . IR RADIOLOGIST EVAL & MGMT  01/02/2017  . IR RADIOLOGIST EVAL & MGMT  02/05/2017  . IR RADIOLOGIST EVAL & MGMT  05/02/2017  . IR RADIOLOGIST EVAL & MGMT  10/17/2017  . IR US GUIDE VASC ACCESS RIGHT  11/28/2016  . IR US GUIDE VASC ACCESS RIGHT  12/13/2016  . IR US GUIDE VASC ACCESS RIGHT  01/09/2017  . KNEE ARTHROSCOPY Right 11/14/2006  . KNEE ARTHROSCOPY Bilateral 5 and 6 years ago  . KNEE ARTHROSCOPY WITH LATERAL MENISECTOMY  07/03/2012   Procedure: KNEE ARTHROSCOPY WITH LATERAL MENISECTOMY;  Surgeon: Magnus Sinning, MD;  Location: [facility];  Service: Orthopedics;  Laterality: Left;  with Partial Lateral Menisectomy and Medial Menisectomy. Shaving of medial and lateral femoral condyles. Shaving of patella. Removal of a loose body  . LOOP RECORDER REMOVAL N/A 05/22/2018   Procedure: LOOP RECORDER REMOVAL;  Surgeon: Thompson Grayer, MD;  Location: [facility] CV LAB;  Service: Cardiovascular;  Laterality: N/A;  . PACEMAKER IMPLANT N/A 05/22/2018   MDT Azure XT DR MRI implanted by Dr Rayann Heman for sick sinus syndrome  . tibial and fibular internal fixation Left   . TOTAL ABDOMINAL HYSTERECTOMY  85 years old  . UPPER GASTROINTESTINAL ENDOSCOPY  2009, 2013     REVIEW OF SYSTEMS:   Review of Systems  Constitutional: Positive for fatigue and weight loss. Negative for appetite change, chills, and fever. HENT:  Positive for one episode of epistaxis last week. Negative for mouth  sores, sore throat and trouble swallowing.   Eyes: Negative for eye problems and icterus.  Respiratory: Positive for mild orthopnea. Negative for cough, hemoptysis, shortness of breath and wheezing.   Cardiovascular: Positive for edema. Negative for chest pain Gastrointestinal: Positive for one episode of LLQ abdominal pain which as resolved. Negative for constipation, diarrhea, nausea and vomiting.  Genitourinary: Negative for bladder incontinence, difficulty urinating, dysuria, frequency and hematuria.   Musculoskeletal: Negative for back pain, gait problem, neck pain and neck stiffness.  Skin: Positive for pruritis. Negative for rash  Neurological: Negative for dizziness, extremity weakness, gait problem, headaches, light-headedness and seizures.  Hematological: Negative for adenopathy. Does not bruise/bleed easily.  Psychiatric/Behavioral: Negative for confusion, depression and sleep disturbance. The patient is not nervous/anxious.     PHYSICAL EXAMINATION:  Blood pressure (!) 146/55, pulse (!) 59, temperature 98.3 F (36.8 C), temperature source Oral, resp. rate 20, height 5\' 4"  (1.626 m), weight 217 lb 12.8 oz (98.8 kg), SpO2 100 %.  ECOG PERFORMANCE STATUS: 1 - Symptomatic but completely ambulatory  Physical Exam  Constitutional: Oriented to person, place, and time and well-developed, well-nourished, and in no distress. No distress.  HENT:  Head: Normocephalic and atraumatic.  Mouth/Throat: Oropharynx is clear and moist. No oropharyngeal exudate.  Eyes: Conjunctivae are normal. Right eye exhibits no discharge. Left eye exhibits no discharge. No scleral icterus.  Neck: Normal range of motion. Neck supple.  Cardiovascular: Normal rate, regular rhythm, normal heart  sounds and intact distal pulses.   Pulmonary/Chest: Effort normal and breath sounds normal. No respiratory distress. No wheezes. No rales.  Abdominal: Soft. Bowel sounds are normal. Exhibits no distension and no mass. There is no tenderness.  Musculoskeletal: Lower extremity edema. Normal range of motion.   Lymphadenopathy:    No cervical adenopathy.  Neurological: Alert and oriented to person, place, and time. Exhibits normal muscle tone. Gait normal. Coordination normal.  Skin: Echymosis on her left forearm. Skin is warm and dry. No rash noted. Not diaphoretic. No erythema. No pallor.  Psychiatric: Mood, memory and judgment normal.  Vitals reviewed.  LABORATORY DATA: Lab Results  Component Value Date   WBC 3.4 (L) 09/30/2018   HGB 11.9 (L) 09/30/2018   HCT 36.9 09/30/2018   MCV 103.4 (H) 09/30/2018   PLT 114 (L) 09/30/2018      Chemistry      Component Value Date/Time   NA 140 09/30/2018 1356   NA 134 05/17/2018 1151   NA 135 (L) 07/10/2017 1142   K 4.1 09/30/2018 1356   K 4.6 07/10/2017 1142   CL 106 09/30/2018 1356   CO2 25 09/30/2018 1356   CO2 25 07/10/2017 1142   BUN 14 09/30/2018 1356   BUN 10 05/17/2018 1151   BUN 11.4 07/10/2017 1142   CREATININE 0.83 09/30/2018 1356   CREATININE 1.0 07/10/2017 1142      Component Value Date/Time   CALCIUM 9.4 09/30/2018 1356   CALCIUM 9.9 07/10/2017 1142   ALKPHOS 102 09/30/2018 1356   ALKPHOS 93 07/10/2017 1142   AST 24 09/30/2018 1356   AST 43 (H) 07/10/2017 1142   ALT 11 09/30/2018 1356   ALT 28 07/10/2017 1142   BILITOT 0.5 09/30/2018 1356   BILITOT 0.56 07/10/2017 1142       RADIOGRAPHIC STUDIES:  No results found.   ASSESSMENT/PLAN:  This is a very pleasant 85 year old Caucasian female with metastatic low-grade neuroendocrine carcinoma with questionable lung primary and multiple metastatic liver lesions and pancreatic lesions.  She was  diagnosed in January 2018.  She is status post treatment with radio  embolization with Y90 to the left and right lobe liver lesions.  She is currently undergoing systemic chemotherapy with Xeloda and Temodar.  He is status post 27 cycles.   The patient was seen with Dr. [name] today.  She will complete this cycle of treatment on Saturday.  The patient is tolerating treatment well without any concerning adverse effects except for fatigue and mild nausea.  She manages her nausea successfully with Zofran.  Labs were reviewed with the patient today.  Recommend she proceed with the current regimen at the same dose.  I will arrange for her to obtain a restaging CT scan of the chest, abdomen, pelvis prior to the next appointment.  I will see her back in one month for evaluation and to review her CT scan at that time.  Regarding the patient's recent episode of LLQ abdominal pain with associated dark stool, labs were reviewed with the patient. Her hemoglobin is stable at 11.9 today. Instructed the patient to hold her Alen Blew and seek medical attention should this happen again. Discussed symptoms that warrant evaluation including but not limited to melena, hematuria, hematochezia, and prolonged mucosal bleeding such as epistaxis. She expressed understanding.  I have sent a refill of Xeloda and Temodar to her pharmacy.  Regarding her pruritis, the patient was advised that she may take benadryl for itching. She was cautioned that this may cause sedation. She expressed understanding.  For her edema and orthopnea, this managed per cardiology. She follows up with cardiology regularly. She will continue to manage her swelling with her lasix, compression stockings, and leg elevation. She knows should she experience worsening or new symptoms to contact her cardiologist.  The patient was advised to call immediately if she has any concerning symptoms in the interval. The patient voices understanding of current disease status and treatment options and is in agreement with the current care  plan. All questions were answered. The patient knows to call the clinic with any problems, questions or concerns. We can certainly see the patient much sooner if necessary   Orders Placed This Encounter  Procedures  . CT Abdomen Pelvis W Contrast    Standing Status:   Future    Standing Expiration Date:   09/30/2019    Order Specific Question:   ** REASON FOR EXAM (FREE TEXT)    Answer:   Restaging Lung Cancer    Order Specific Question:   If indicated for the ordered procedure, I authorize the administration of contrast media per Radiology protocol    Answer:   Yes    Order Specific Question:   Preferred imaging location?    Answer:   [facility]    Order Specific Question:   Is Oral Contrast requested for this exam?    Answer:   Yes, Per Radiology protocol    Order Specific Question:   Radiology Contrast Protocol - do NOT remove file path    Answer:   \\[email or web address]  . CT Chest W Contrast    Standing Status:   Future    Standing Expiration Date:   09/30/2019    Order Specific Question:   ** REASON FOR EXAM (FREE TEXT)    Answer:   Restaging Lung Cancer    Order Specific Question:   If indicated for the ordered procedure, I authorize the administration of contrast media per Radiology protocol    Answer:   Yes    Order Specific  Question:   Preferred imaging location?    Answer:   [facility]    Order Specific Question:   Radiology Contrast Protocol - do NOT remove file path    Answer:   \\[email or web address]  . CBC with Differential ([place] Only)    Standing Status:   Future    Standing Expiration Date:   09/30/2019  . CMP ([facility] only)    Standing Status:   Future    Standing Expiration Date:   09/30/2019     Tobe Sos Mercer Peifer, PA-C 09/30/18  ADDENDUM: Hematology/Oncology Attending: I had a face-to-face encounter with the patient today.  I recommended her care plan.  This is a very  pleasant 85 years old white female with metastatic low-grade neuroendocrine carcinoma.  She is currently on treatment with Xeloda and Temodar and tolerating her treatment fairly well.  She lost a few pounds recently secondary to lack of appetite.  She denied having any chest pain, shortness of breath, cough or hemoptysis.  She has no bleeding issues except for one episode of black tarry stool a week ago that completely resolved and she has no more episodes.  She has no significant decrease in her hemoglobin and hematocrit compared to a month ago. I recommended for the patient to continue her current treatment with the same regimen for now. We will see her back for follow-up visit in 1 months for evaluation after repeating CT scan of the chest, abdomen and pelvis for restaging of her disease. The patient was advised to call immediately if she has any concerning symptoms in the interval.  She was also advised to go immediately to the emergency department if she has any other episodes of significant rectal bleeding.  Disclaimer: This note was dictated with voice recognition software. Similar sounding words can inadvertently be transcribed and may be missed upon review. Eilleen Kempf, MD 09/30/18]

## 2018-10-06 ENCOUNTER — Other Ambulatory Visit: Payer: Self-pay | Admitting: Pediatrics

## 2018-10-10 ENCOUNTER — Other Ambulatory Visit: Payer: Self-pay

## 2018-10-10 ENCOUNTER — Ambulatory Visit (INDEPENDENT_AMBULATORY_CARE_PROVIDER_SITE_OTHER): Payer: Medicare Other | Admitting: Family Medicine

## 2018-10-10 ENCOUNTER — Encounter: Payer: Self-pay | Admitting: Family Medicine

## 2018-10-10 VITALS — BP 139/64 | HR 60 | Temp 97.8°F | Ht 64.0 in | Wt 224.4 lb

## 2018-10-10 DIAGNOSIS — E785 Hyperlipidemia, unspecified: Secondary | ICD-10-CM | POA: Diagnosis not present

## 2018-10-10 DIAGNOSIS — K219 Gastro-esophageal reflux disease without esophagitis: Secondary | ICD-10-CM

## 2018-10-10 DIAGNOSIS — E038 Other specified hypothyroidism: Secondary | ICD-10-CM

## 2018-10-10 DIAGNOSIS — Z889 Allergy status to unspecified drugs, medicaments and biological substances status: Secondary | ICD-10-CM | POA: Diagnosis not present

## 2018-10-10 DIAGNOSIS — R399 Unspecified symptoms and signs involving the genitourinary system: Secondary | ICD-10-CM

## 2018-10-10 DIAGNOSIS — F32A Depression, unspecified: Secondary | ICD-10-CM

## 2018-10-10 DIAGNOSIS — R609 Edema, unspecified: Secondary | ICD-10-CM | POA: Diagnosis not present

## 2018-10-10 DIAGNOSIS — F329 Major depressive disorder, single episode, unspecified: Secondary | ICD-10-CM | POA: Diagnosis not present

## 2018-10-10 DIAGNOSIS — F411 Generalized anxiety disorder: Secondary | ICD-10-CM | POA: Diagnosis not present

## 2018-10-10 DIAGNOSIS — E782 Mixed hyperlipidemia: Secondary | ICD-10-CM

## 2018-10-10 MED ORDER — LEVOFLOXACIN 500 MG PO TABS: 500.0000 mg | ORAL_TABLET | Freq: Every day | ORAL | 0 refills | Status: DC

## 2018-10-10 MED ORDER — LOVASTATIN 40 MG PO TABS: 40.0000 mg | ORAL_TABLET | Freq: Every day | ORAL | 1 refills | Status: DC

## 2018-10-10 MED ORDER — GABAPENTIN 400 MG PO CAPS: ORAL_CAPSULE | ORAL | 1 refills | Status: DC

## 2018-10-10 MED ORDER — AMITRIPTYLINE HCL 25 MG PO TABS: 25.0000 mg | ORAL_TABLET | Freq: Every day | ORAL | 1 refills | Status: DC

## 2018-10-10 MED ORDER — ONDANSETRON HCL 8 MG PO TABS: 8.0000 mg | ORAL_TABLET | Freq: Three times a day (TID) | ORAL | 1 refills | Status: DC | PRN

## 2018-10-10 MED ORDER — ESCITALOPRAM OXALATE 20 MG PO TABS: 20.0000 mg | ORAL_TABLET | Freq: Every day | ORAL | 1 refills | Status: DC

## 2018-10-10 MED ORDER — FLUTICASONE PROPIONATE 50 MCG/ACT NA SUSP: 2.0000 | Freq: Every day | NASAL | 3 refills | Status: DC

## 2018-10-10 MED ORDER — OMEPRAZOLE 40 MG PO CPDR: 40.0000 mg | DELAYED_RELEASE_CAPSULE | Freq: Every day | ORAL | 1 refills | Status: DC

## 2018-10-10 MED ORDER — FUROSEMIDE 20 MG PO TABS: ORAL_TABLET | ORAL | 1 refills | Status: DC

## 2018-10-10 MED ORDER — LEVOTHYROXINE SODIUM 75 MCG PO TABS: 75.0000 ug | ORAL_TABLET | Freq: Every day | ORAL | 1 refills | Status: DC

## 2018-10-10 NOTE — Progress Notes (Signed)
Subjective:  Patient ID: [name], female    DOB: Dec 13, 1933  Age: 85 y.o. MRN: [id number]  CC: Medical Management of Chronic Issues   HPI [name] presents for Patient in for follow-up of atrial fibrillation. Patient denies any recent bouts of chest pain or palpitations. Additionally, patient is taking anticoagulants. Patient denies any recent excessive bleeding episodes including epistaxis, bleeding from the gums, genitalia, rectal bleeding or hematuria. Additionally there has been no excessive bruising.  Patient in for follow-up of GERD. Currently asymptomatic taking  PPI daily. There is no chest pain or heartburn. No hematemesis and no melena. No dysphagia or choking. Onset is remote. Progression is stable. Complicating factors, none. Patient in for follow-up of elevated cholesterol. Doing well without complaints on current medication. Denies side effects of statin including myalgia and arthralgia and nausea. Also in today for liver function testing. Currently no chest pain, shortness of breath or other cardiovascular related symptoms noted.  Follow-up thyroid patient presents for follow-up on  thyroid. The patient has a history of hypothyroidism for many years. It has been stable recently. Pt. denies any change in  voice, loss of hair, heat or cold intolerance. Energy level has been adequate to good. Patient denies constipation and diarrhea. No myxedema. Medication is as noted below. Verified that pt is taking it daily on an empty stomach. Well tolerated.  Depression screen [facility] 2/9 10/10/2018 07/10/2018 06/04/2018  Decreased Interest 0 0 2  Down, Depressed, Hopeless 0 0 1  PHQ - 2 Score 0 0 3  Altered sleeping - - 2  Tired, decreased energy - - 2  Change in appetite - - 2  Feeling bad or failure about yourself  - - 1  Trouble concentrating - - 2  Moving slowly or fidgety/restless - - 3  Suicidal thoughts - - 1  PHQ-9 Score - - 16  Difficult doing work/chores - - Somewhat  difficult  Some recent data might be hidden    History [name] has a past medical history of Acute respiratory failure with hypoxia ([place]) (10/23/2015), Allergic rhinitis, Anxiety, Aortic insufficiency, Arthritis, Ataxia, Bradycardia, Burning tongue syndrome (25 years), Cancer ([name]) (dx'd 06/2016), Cataract, Cerebellar degeneration ([place]), Chronic urinary tract infection, Complication of anesthesia, CVA (cerebral infarction) (05/2003), Depression, Encounter for antineoplastic chemotherapy (10/09/2016), Gait disorder, Gastric polyps, GERD (gastroesophageal reflux disease), Goals of care, counseling/discussion (10/09/2016), High cholesterol, History of pericarditis, Hyperlipidemia, Hypotension, Hypothyroidism, IBS (irritable bowel syndrome), Obesity, Paroxysmal atrial fibrillation ([place]), Pericarditis, Personal history of arterial venous malformation (AVM), Seizure disorder ([place]), Seizures ([place]) (2003), Shortness of breath dyspnea, Sick sinus syndrome ([place]), Sternum fx (10/27/2013), Stroke ([place]) (5 years ago), Thyroid disease, TIA (transient ischemic attack), and UTI (lower urinary tract infection) (03/27/2016).   She has a past surgical history that includes Knee arthroscopy (Right, 11/14/2006); tibial and fibular internal fixation (Left); Upper gastrointestinal endoscopy (2009, 2013); Colonoscopy (2006, 2009); Appendectomy (85 years old); cyst removed (35 years ago); Total abdominal hysterectomy (85 years old); Knee arthroscopy (Bilateral, 5 and 6 years ago); Knee arthroscopy with lateral menisectomy (07/03/2012); Cardiac catheterization (N/A, 01/06/2015); Eye surgery (Right); Colonoscopy with propofol (N/A, 03/28/2016); IR Angiogram Visceral Selective (11/28/2016); IR Angiogram Selective Each Additional Vessel (11/28/2016); IR US Guide Vasc Access Right (11/28/2016); IR Angiogram Selective Each Additional Vessel (11/28/2016); IR Angiogram Visceral Selective (11/28/2016); IR Angiogram Selective Each Additional Vessel (11/28/2016);  IR EMBO ARTERIAL NOT HEMORR HEMANG INC GUIDE ROADMAPPING (11/28/2016); IR Angiogram Selective Each Additional Vessel (11/28/2016); IR Angiogram Selective Each Additional Vessel (12/13/2016); IR  Angiogram Visceral Selective (12/13/2016); IR Angiogram Selective Each Additional Vessel (12/13/2016); IR US Guide Vasc Access Right (12/13/2016); IR Angiogram Visceral Selective (12/13/2016); IR EMBO TUMOR ORGAN ISCHEMIA INFARCT INC GUIDE ROADMAPPING (12/13/2016); IR Angiogram Selective Each Additional Vessel (01/09/2017); IR EMBO TUMOR ORGAN ISCHEMIA INFARCT INC GUIDE ROADMAPPING (01/09/2017); IR US Guide Vasc Access Right (01/09/2017); IR Angiogram Visceral Selective (01/09/2017); IR Radiologist Eval & Mgmt (11/06/2016); IR Radiologist Eval & Mgmt (01/02/2017); IR IVC FILTER PLMT / S&I /IMG GUID/MOD SED (04/24/2017); IR Radiologist Eval & Mgmt (02/05/2017); IR Radiologist Eval & Mgmt (05/02/2017); IR Radiologist Eval & Mgmt (10/17/2017); CORONARY ANGIOGRAPHY (N/A, 05/22/2018); PACEMAKER IMPLANT (N/A, 05/22/2018); and LOOP RECORDER REMOVAL (N/A, 05/22/2018).   Her family history includes Coronary artery disease in her brother; Diabetes in her brother; Heart attack (age of onset: 25) in her father; Prostate cancer in her brother and son.She reports that she quit smoking about 43 years ago. Her smoking use included cigarettes. She has a 2.50 pack-year smoking history. She has never used smokeless tobacco. She reports that she does not drink alcohol or use drugs.    ROS Review of Systems  Constitutional: Negative.   HENT: Negative for congestion.   Eyes: Negative for visual disturbance.  Respiratory: Negative for shortness of breath.   Cardiovascular: Negative for chest pain.  Gastrointestinal: Negative for abdominal pain, constipation, diarrhea, nausea and vomiting.  Genitourinary: Negative for difficulty urinating.  Musculoskeletal: Negative for arthralgias and myalgias.  Neurological: Negative for headaches.   Psychiatric/Behavioral: Negative for sleep disturbance.    Objective:  BP 139/64   Pulse 60   Temp 97.8 F (36.6 C) (Oral)   Ht _0  (1.626 m)   Wt 224 lb 6 oz (101.8 kg)   BMI 38.51 kg/m   BP Readings from Last 3 Encounters:  10/10/18 139/64  09/30/18 (!) 146/55  09/04/18 (!) 112/56    Wt Readings from Last 3 Encounters:  10/10/18 224 lb 6 oz (101.8 kg)  09/30/18 217 lb 12.8 oz (98.8 kg)  09/04/18 225 lb (102.1 kg)     Physical Exam Constitutional:      General: She is not in acute distress.    Appearance: She is well-developed.  HENT:     Head: Normocephalic and atraumatic.  Eyes:     Conjunctiva/sclera: Conjunctivae normal.     Pupils: Pupils are equal, round, and reactive to light.  Neck:     Musculoskeletal: Normal range of motion and neck supple.     Thyroid: No thyromegaly.  Cardiovascular:     Rate and Rhythm: Normal rate and regular rhythm.     Heart sounds: Normal heart sounds. No murmur.  Pulmonary:     Effort: Pulmonary effort is normal. No respiratory distress.     Breath sounds: Normal breath sounds. No wheezing or rales.  Abdominal:     General: Bowel sounds are normal. There is no distension.     Palpations: Abdomen is soft.     Tenderness: There is no abdominal tenderness.  Musculoskeletal: Normal range of motion.  Lymphadenopathy:     Cervical: No cervical adenopathy.  Skin:    General: Skin is warm and dry.  Neurological:     Mental Status: She is alert and oriented to person, place, and time.  Psychiatric:        Behavior: Behavior normal.        Thought Content: Thought content normal.        Judgment: Judgment normal.       Assessment &  Plan:   [place] was seen today for medical management of chronic issues.  Diagnoses and all orders for this visit:  UTI symptoms -     Urine Culture  Depression, unspecified depression type -     escitalopram (LEXAPRO) 20 MG tablet; Take 1 tablet (20 mg total) by mouth at  bedtime.  GAD (generalized anxiety disorder) -     escitalopram (LEXAPRO) 20 MG tablet; Take 1 tablet (20 mg total) by mouth at bedtime.  Gastroesophageal reflux disease, esophagitis presence not specified -     omeprazole (PRILOSEC) 40 MG capsule; Take 1 capsule (40 mg total) by mouth daily.  Hyperlipemia -     lovastatin (MEVACOR) 40 MG tablet; Take 1 tablet (40 mg total) by mouth at bedtime. -     Lipid panel  Multiple allergies -     fluticasone (FLONASE) 50 MCG/ACT nasal spray; Place 2 sprays into both nostrils at bedtime.  Other specified hypothyroidism -     levothyroxine (SYNTHROID, LEVOTHROID) 75 MCG tablet; Take 1 tablet (75 mcg total) by mouth daily before breakfast. -     TSH -     T4, Free  Swelling -     furosemide (LASIX) 20 MG tablet; TAKE 1 TABLET DAILY AS NEEDED FOR FLUID RETENTION -     CBC with Differential/Platelet -     CMP14+EGFR -     Urine Culture -     Urinalysis  Other orders -     amitriptyline (ELAVIL) 25 MG tablet; Take 1 tablet (25 mg total) by mouth at bedtime. -     gabapentin (NEURONTIN) 400 MG capsule; TAKE 1 TO 2 CAPSULES THREE TIMES A DAY -     ondansetron (ZOFRAN) 8 MG tablet; Take 1 tablet (8 mg total) by mouth every 8 (eight) hours as needed for nausea or vomiting. -     Discontinue: levofloxacin (LEVAQUIN) 500 MG tablet; Take 1 tablet (500 mg total) by mouth daily. For 10 days -     levofloxacin (LEVAQUIN) 500 MG tablet; Take 1 tablet (500 mg total) by mouth daily. For 10 days       I have discontinued [place]. [name]'s ALPRAZolam and levofloxacin. I have also changed her levothyroxine. Additionally, I am having her start on levofloxacin. Lastly, I am having her maintain her Melatonin, hydroxypropyl methylcellulose / hypromellose, oxyCODONE-acetaminophen, amLODipine, Xarelto, OXcarbazepine, temozolomide, capecitabine, escitalopram, omeprazole, lovastatin, fluticasone, furosemide, amitriptyline, gabapentin, and ondansetron.  Allergies  as of 10/10/2018      Reactions   Lisinopril Cough      Medication List       Accurate as of October 10, 2018 10:54 PM. Always use your most recent med list.        amitriptyline 25 MG tablet Commonly known as:  ELAVIL Take 1 tablet (25 mg total) by mouth at bedtime.   amLODipine 2.5 MG tablet Commonly known as:  NORVASC Take 1 tablet (2.5 mg total) by mouth daily.   capecitabine 500 MG tablet Commonly known as:  XELODA TAKE 3 TABLETS (1500MG) BY MOUTH TWICE DAILY AFTER A MEAL. TAKE ON DAYS 1-14 OF EACH 28 DAY CYCLE   escitalopram 20 MG tablet Commonly known as:  LEXAPRO Take 1 tablet (20 mg total) by mouth at bedtime.   fluticasone 50 MCG/ACT nasal spray Commonly known as:  FLONASE Place 2 sprays into both nostrils at bedtime.   furosemide 20 MG tablet Commonly known as:  LASIX TAKE 1 TABLET DAILY AS NEEDED FOR FLUID RETENTION  gabapentin 400 MG capsule Commonly known as:  NEURONTIN TAKE 1 TO 2 CAPSULES THREE TIMES A DAY   hydroxypropyl methylcellulose / hypromellose 2.5 % ophthalmic solution Commonly known as:  ISOPTO TEARS / GONIOVISC Place 1 drop into both eyes daily.   levofloxacin 500 MG tablet Commonly known as:  LEVAQUIN Take 1 tablet (500 mg total) by mouth daily. For 10 days   levothyroxine 75 MCG tablet Commonly known as:  SYNTHROID, LEVOTHROID Take 1 tablet (75 mcg total) by mouth daily before breakfast.   lovastatin 40 MG tablet Commonly known as:  MEVACOR Take 1 tablet (40 mg total) by mouth at bedtime.   Melatonin 3 MG Tbdp Take 3-6 mg by mouth at bedtime as needed.   omeprazole 40 MG capsule Commonly known as:  PRILOSEC Take 1 capsule (40 mg total) by mouth daily.   ondansetron 8 MG tablet Commonly known as:  ZOFRAN Take 1 tablet (8 mg total) by mouth every 8 (eight) hours as needed for nausea or vomiting.   OXcarbazepine 150 MG tablet Commonly known as:  TRILEPTAL Take 1 tablet (150 mg total) by mouth 2 (two) times daily.    oxyCODONE-acetaminophen 5-325 MG tablet Commonly known as:  Percocet Take 1 tablet by mouth every 8 (eight) hours as needed for severe pain.   temozolomide 140 MG capsule Commonly known as:  TEMODAR TAKE 2 CAPSULES (280MG) BY MOUTH DAILY FOR 5 DAYS ON DAYS 10-14 OF EACH 28D CYCLE. MAY TAKE ON EMPTY STOMACH AT BEDTIME TO DECREASE NAUSEA A   Xarelto 20 MG Tabs tablet Generic drug:  rivaroxaban TAKE 1 TABLET DAILY WITH SUPPER        Follow-up: Return in about 6 months (around 04/12/2019).  [name], M.D. n

## 2018-10-11 LAB — CBC WITH DIFFERENTIAL/PLATELET
BASOS: 1 %
Basophils Absolute: 0 10*3/uL (ref 0.0–0.2)
EOS (ABSOLUTE): 0.4 10*3/uL (ref 0.0–0.4)
EOS: 11 %
Hematocrit: 33.8 % — ABNORMAL LOW (ref 34.0–46.6)
Hemoglobin: 11.4 g/dL (ref 11.1–15.9)
IMMATURE GRANULOCYTES: 0 %
Immature Grans (Abs): 0 10*3/uL (ref 0.0–0.1)
Lymphocytes Absolute: 0.7 10*3/uL (ref 0.7–3.1)
Lymphs: 21 %
MCH: 33.6 pg — ABNORMAL HIGH (ref 26.6–33.0)
MCHC: 33.7 g/dL (ref 31.5–35.7)
MCV: 100 fL — AB (ref 79–97)
Monocytes Absolute: 0.6 10*3/uL (ref 0.1–0.9)
Monocytes: 18 %
Neutrophils Absolute: 1.7 10*3/uL (ref 1.4–7.0)
Neutrophils: 49 %
Platelets: 108 10*3/uL — ABNORMAL LOW (ref 150–450)
RBC: 3.39 x10E6/uL — AB (ref 3.77–5.28)
RDW: 17.3 % — ABNORMAL HIGH (ref 11.7–15.4)
WBC: 3.5 10*3/uL (ref 3.4–10.8)

## 2018-10-11 LAB — CMP14+EGFR
ALT: 11 IU/L (ref 0–32)
AST: 21 IU/L (ref 0–40)
Albumin/Globulin Ratio: 1.5 (ref 1.2–2.2)
Albumin: 3.8 g/dL (ref 3.6–4.6)
Alkaline Phosphatase: 111 IU/L (ref 39–117)
BUN/Creatinine Ratio: 18 (ref 12–28)
BUN: 14 mg/dL (ref 8–27)
Bilirubin Total: 0.4 mg/dL (ref 0.0–1.2)
CO2: 22 mmol/L (ref 20–29)
Calcium: 9.4 mg/dL (ref 8.7–10.3)
Chloride: 103 mmol/L (ref 96–106)
Creatinine, Ser: 0.8 mg/dL (ref 0.57–1.00)
GFR calc Af Amer: 79 mL/min/{1.73_m2} (ref >59)
GFR calc non Af Amer: 69 mL/min/{1.73_m2} (ref >59)
Globulin, Total: 2.5 g/dL (ref 1.5–4.5)
Glucose: 99 mg/dL (ref 65–99)
Potassium: 4.3 mmol/L (ref 3.5–5.2)
Sodium: 140 mmol/L (ref 134–144)
Total Protein: 6.3 g/dL (ref 6.0–8.5)

## 2018-10-11 LAB — LIPID PANEL
CHOL/HDL RATIO: 2.8 ratio (ref 0.0–4.4)
Cholesterol, Total: 133 mg/dL (ref 100–199)
HDL: 48 mg/dL (ref >39)
LDL Calculated: 59 mg/dL (ref 0–99)
Triglycerides: 129 mg/dL (ref 0–149)
VLDL Cholesterol Cal: 26 mg/dL (ref 5–40)

## 2018-10-11 LAB — T4, FREE: Free T4: 1.3 ng/dL (ref 0.82–1.77)

## 2018-10-11 LAB — TSH: TSH: 3.06 u[IU]/mL (ref 0.450–4.500)

## 2018-10-11 NOTE — Progress Notes (Signed)
Hello [place],  Your lab result is normal.Some minor variations that are not significant are commonly marked abnormal, but do not represent any medical problem for you.  Best regards, [name], M.D.

## 2018-10-12 LAB — URINE CULTURE

## 2018-10-13 ENCOUNTER — Telehealth: Payer: Self-pay | Admitting: Family Medicine

## 2018-10-13 ENCOUNTER — Other Ambulatory Visit: Payer: Self-pay | Admitting: Family Medicine

## 2018-10-13 MED ORDER — AMOXICILLIN-POT CLAVULANATE 875-125 MG PO TABS: 1.0000 | ORAL_TABLET | Freq: Two times a day (BID) | ORAL | 0 refills | Status: DC

## 2018-10-13 NOTE — Telephone Encounter (Signed)
After patient took Levaquin she had a reaction; she began having shortness of breathness, numbness and tingling in arms and hands.  She stopped taking and has not had anything since.  Husband is asking that you send another antibiotic to [facility].

## 2018-10-13 NOTE — Telephone Encounter (Signed)
Husband aware.

## 2018-10-13 NOTE — Telephone Encounter (Signed)
I sent in the requested prescription 

## 2018-10-15 ENCOUNTER — Telehealth: Payer: Self-pay | Admitting: Family Medicine

## 2018-10-15 LAB — URINALYSIS
Bilirubin, UA: NEGATIVE
Glucose, UA: NEGATIVE
KETONES UA: NEGATIVE
Nitrite, UA: NEGATIVE
Protein, UA: NEGATIVE
Specific Gravity, UA: 1.02 (ref 1.005–1.030)
Urobilinogen, Ur: 0.2 mg/dL (ref 0.2–1.0)
pH, UA: 6 (ref 5.0–7.5)

## 2018-10-15 MED ORDER — AMLODIPINE BESYLATE 2.5 MG PO TABS: 2.5000 mg | ORAL_TABLET | Freq: Every day | ORAL | 1 refills | Status: DC

## 2018-10-15 NOTE — Addendum Note (Signed)
Addended by: Marylin Crosby on: 10/15/2018 12:19 PM   Modules accepted: Orders

## 2018-10-15 NOTE — Telephone Encounter (Signed)
error 

## 2018-10-16 MED FILL — CAPECITABINE 500 MG TABS: 500 | 14 days supply | Qty: 84 | Fill #0

## 2018-10-16 MED FILL — TEMOZOLOMIDE 140 MG CAPS: 140 | 28 days supply | Qty: 10 | Fill #0

## 2018-10-28 ENCOUNTER — Other Ambulatory Visit: Payer: Medicare Other

## 2018-10-30 ENCOUNTER — Ambulatory Visit: Payer: Medicare Other | Admitting: Physician Assistant

## 2018-11-10 IMAGING — CT CT CTA ABD/PEL W/CM AND/OR W/O CM
2 of 11 series · 10 of 46 positions shown, 15 images · IV contrast (isovue)
Comparison: CT 10/29/2016 and 02/19/2008

CLINICAL DATA: Neuroendocrine cancer with liver metastasis.
Evaluate anatomy prior to potential radioembolization procedure.

EXAM:
CT ANGIOGRAPHY ABDOMEN AND PELVIS WITH CONTRAST AND WITHOUT CONTRAST
TECHNIQUE: Multidetector CT imaging of the abdomen and pelvis was performed
using the standard protocol during bolus administration of
intravenous contrast. Multiplanar reconstructed images and MIPs were
obtained and reviewed to evaluate the vascular anatomy.
CONTRAST:  100 mL Isovue 370

[Series 7: coronal arterial · coronal · arterial · 0.49mm/px · 3 of 110 slices shown]
[im 28/110  soft-tissue]
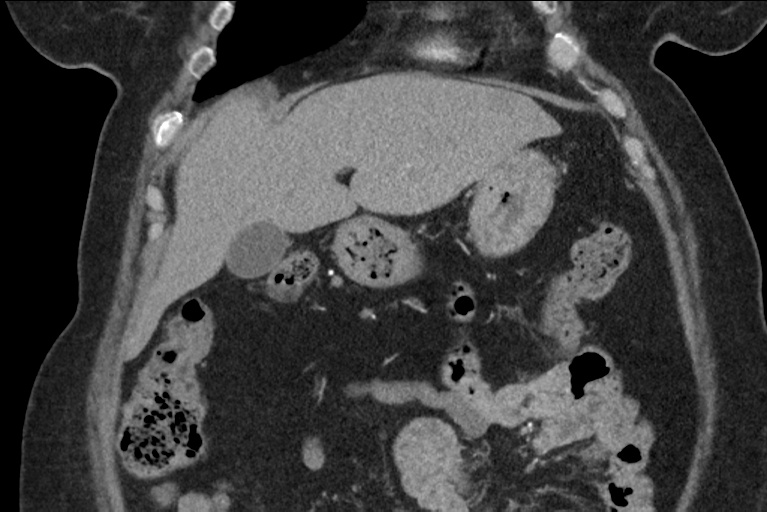
[im 55/110  soft-tissue]
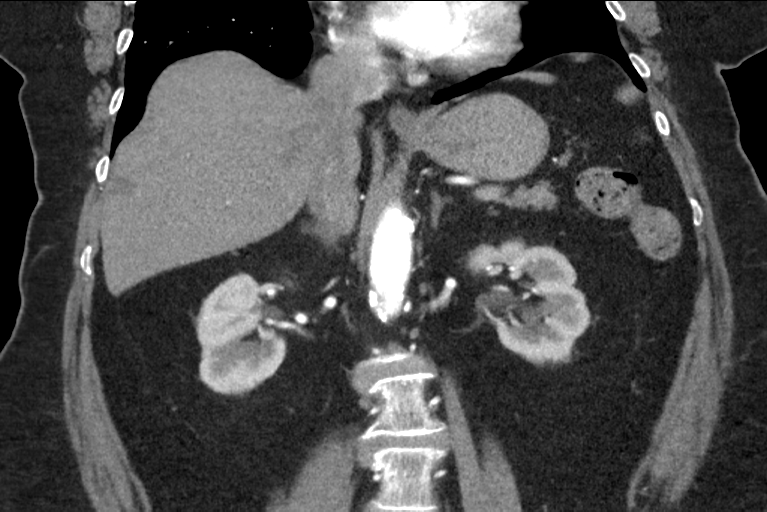
[im 82/110  soft-tissue]
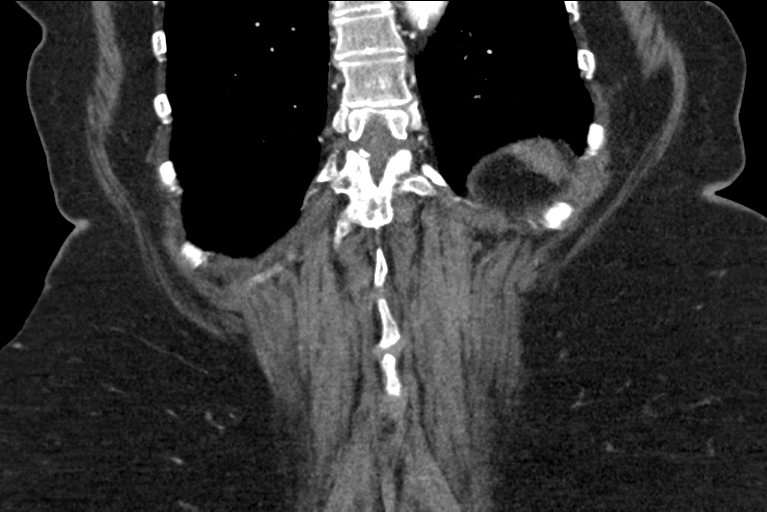

[Series 9: axial venous · axial · portal-venous · 0.95mm/px · z∈[+1101,+1443]mm · 7 of 154 slices shown, 12 images]
[im 20/154  soft-tissue]
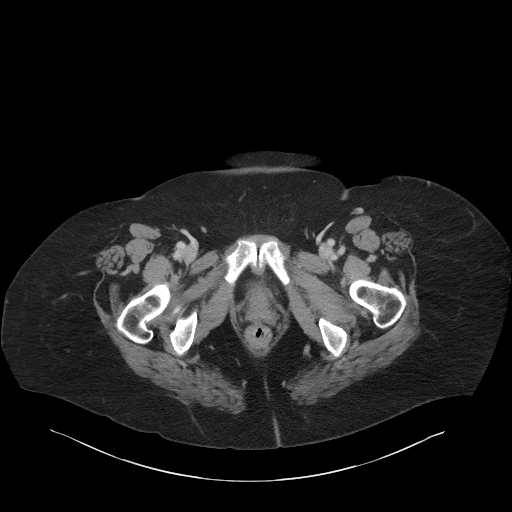
[im 20/154  bone]
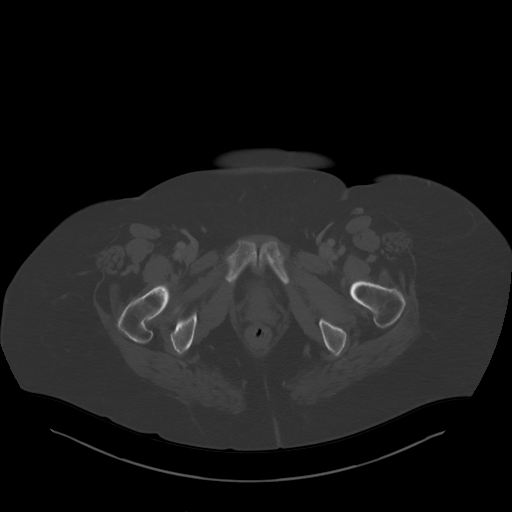
[im 39/154  soft-tissue]
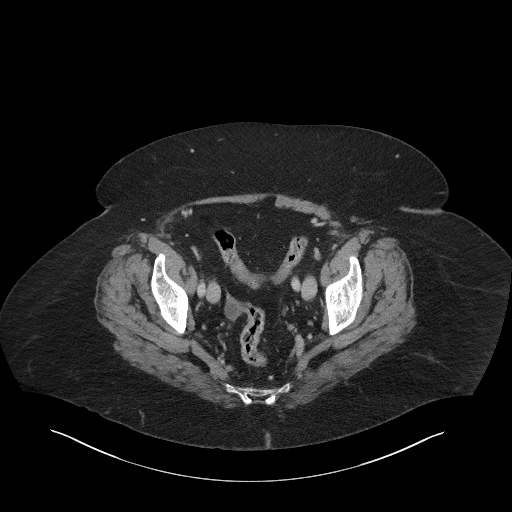
[im 58/154  soft-tissue]
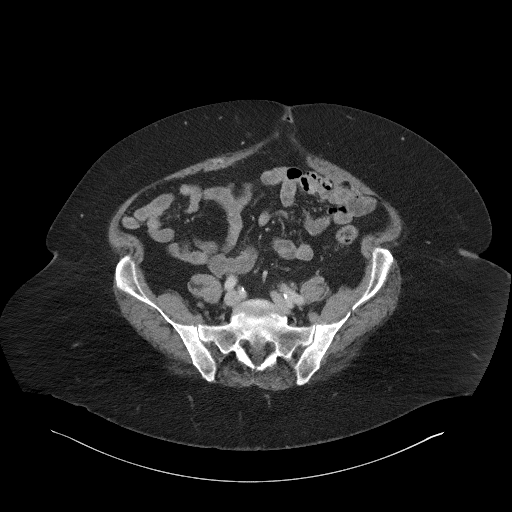
[im 77/154  soft-tissue]
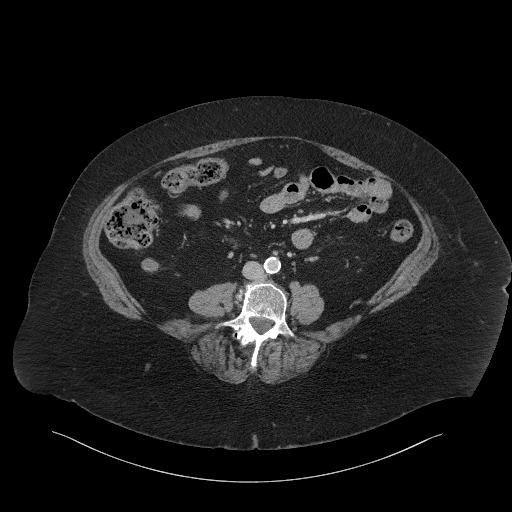
[im 77/154  lung]
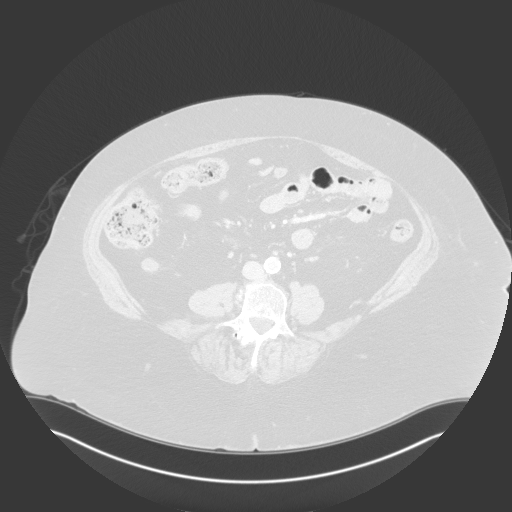
[im 96/154  soft-tissue]
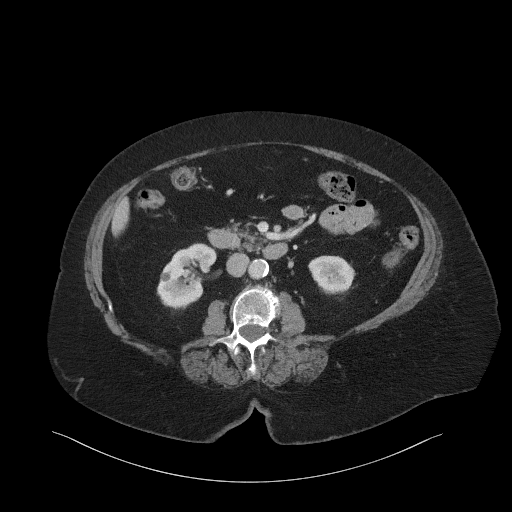
[im 96/154  lung]
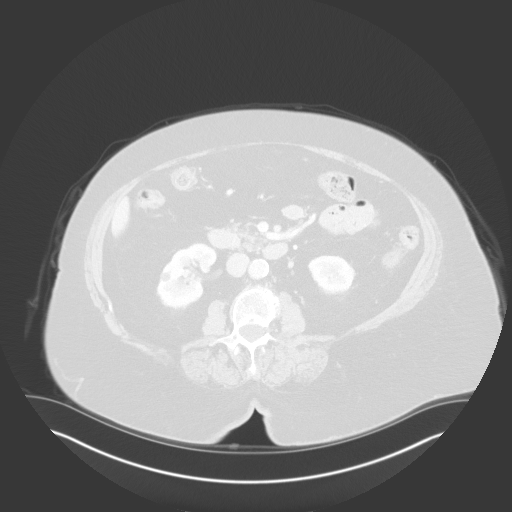
[im 115/154  soft-tissue]
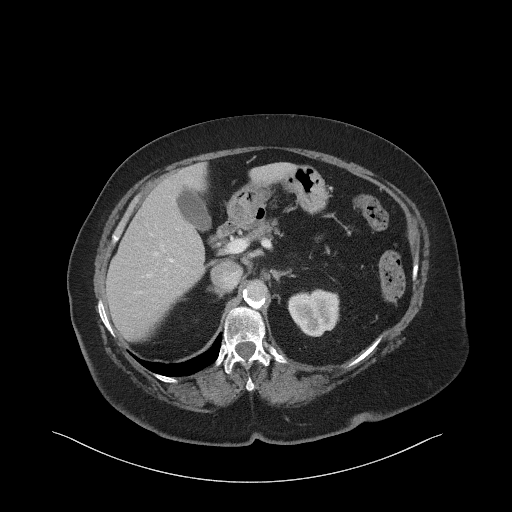
[im 115/154  lung]
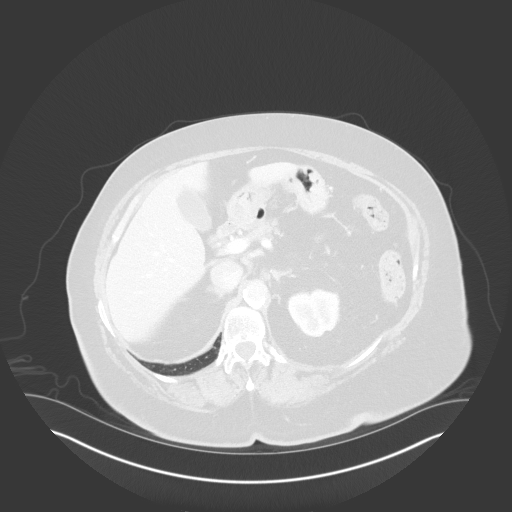
[im 134/154  soft-tissue]
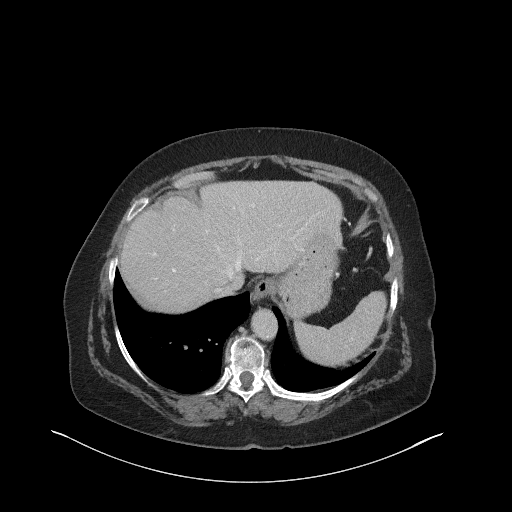
[im 134/154  lung]
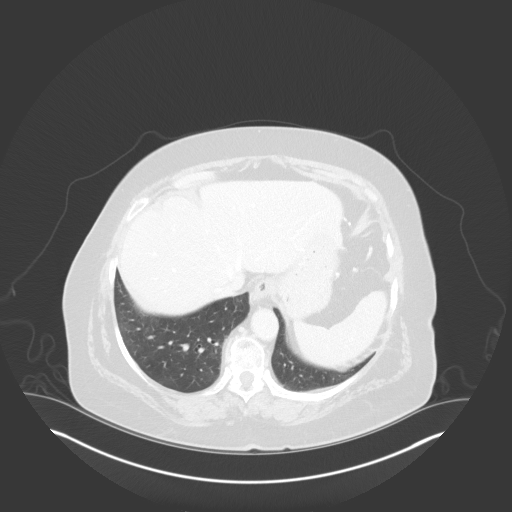

[10 of 46 positions shown; findings below may reference images not displayed]

FINDINGS: VASCULAR

Aorta: Atherosclerotic disease and calcifications in the abdominal
aorta without aneurysm or dissection.

Celiac: Small amount of calcified plaque at the origin of the celiac
trunk without any significant stenosis. Main branch vessels of the
celiac trunk are the left gastric artery, splenic artery and common
hepatic artery. Common hepatic artery terminates into the
gastroduodenal artery and left hepatic artery. Right hepatic artery
originates from the SMA. The right gastric artery comes off the left
hepatic artery and best seen on the coronal reformats, sequence 7,
image 40. Right gastric artery is also visualized on sequence 4,
image 34 on the axial images.

SMA: Atherosclerotic calcifications at the SMA origin without
significant stenosis. Replaced right hepatic artery comes off the
SMA. Main SMA branches are patent.

Renals: Single bilateral renal arteries. Calcified plaque at the
origin of the renal arteries with mild right renal artery stenosis.
No significant left renal artery stenosis.

IMA: IMA is patent.

Inflow: Atherosclerotic calcifications in the common iliac arteries
without significant stenosis. External iliac arteries are patent.

Veins: Iliac veins, IVC and renal veins are patent. Portal venous
system is patent.

Review of the MIP images confirms the above findings.

NON-VASCULAR

Lower chest: Again noted is a 3 mm nodule in the left lower lobe on
sequence 3, image 4. No pleural effusions.

Hepatobiliary: There is a slightly hypodense lesion in the anterior
right hepatic lobe, segment 8, that measures up to 3.2 cm and stable
from the recent comparison examination. Again noted is a hypodense
lesion in the lateral left hepatic lobe, segments 3, measuring up to
2.2 cm. Lesion at the left hepatic dome, segment 2, measuring up to
1.5 cm. There is an additional lesion in the posterior right hepatic
lobe adjacent to the gallbladder measuring 0.9 cm. No intrahepatic
biliary dilatation.

Pancreas: Subtle soft tissue fullness at the tail of pancreas.
Parenchymal fullness is slightly different than the remainder the
pancreas and this area also corresponds with hypermetabolic activity
from a previous PET-CT on 10/02/2016. This is concerning for a
primary pancreatic lesion. No surrounding inflammatory changes and
no duct dilatation in the pancreas.

Spleen: Normal appearance of spleen without enlargement.

Adrenals/Urinary Tract: Normal adrenal glands. Tiny stone in the
left kidney lower pole. No hydronephrosis. Probable small cortical
cyst in the right kidney interpolar region. Urinary bladder is
unremarkable.

Stomach/Bowel: No evidence for bowel dilatation or obstruction. No
evidence for focal bowel inflammation.

Lymphatic: No significant lymph node enlargement in the abdomen
pelvis. No evidence for a mesenteric mass.

Reproductive: Uterus has been removed. Stable low-density structure
in the right adnexa which could represent a small cyst.

Other: No free fluid.  No free air.

Musculoskeletal: No suspicious bone lesions.
IMPRESSION: VASCULAR

Variant hepatic anatomy. Replaced right hepatic artery coming off
the SMA.

Right gastric artery originates from the left hepatic artery.

NON-VASCULAR

Hepatic metastasis.  There are at least four hepatic lesions.

Fullness in the pancreatic tail may represent a primary pancreatic
lesion and corresponding with the previous PET findings. The hepatic
lesions are not FDG avid on the previous PET-CT. Consider Ga
68-DOTATATE imaging to assess and follow-up liver directed therapy.

Small cystic structure in the right adnexa. This has not
significantly changed since 02/19/2008 and likely benign.

Tiny nonobstructive left kidney stone.

Stable 3 mm pulmonary nodule in left lower lobe.

## 2018-11-11 MED FILL — CAPECITABINE 500 MG TABS: 500 | 14 days supply | Qty: 84 | Fill #1

## 2018-11-11 MED FILL — TEMOZOLOMIDE 140 MG CAPS: 140 | 28 days supply | Qty: 10 | Fill #1

## 2018-11-26 ENCOUNTER — Ambulatory Visit (INDEPENDENT_AMBULATORY_CARE_PROVIDER_SITE_OTHER): Payer: Medicare Other | Admitting: *Deleted

## 2018-11-26 ENCOUNTER — Other Ambulatory Visit: Payer: Self-pay

## 2018-11-26 DIAGNOSIS — I495 Sick sinus syndrome: Secondary | ICD-10-CM | POA: Diagnosis not present

## 2018-11-26 DIAGNOSIS — I480 Paroxysmal atrial fibrillation: Secondary | ICD-10-CM

## 2018-11-28 LAB — CUP PACEART REMOTE DEVICE CHECK
Battery Remaining Longevity: 162 mo
Battery Voltage: 3.14 V
Brady Statistic AP VP Percent: 0.48 %
Brady Statistic AP VS Percent: 94.19 %
Brady Statistic AS VP Percent: 0.02 %
Brady Statistic AS VS Percent: 5.31 %
Brady Statistic RA Percent Paced: 94.82 %
Brady Statistic RV Percent Paced: 0.5 %
Date Time Interrogation Session: 20200430201258
Implantable Lead Implant Date: 20191024
Implantable Lead Implant Date: 20191024
Implantable Lead Location: 753859
Implantable Lead Location: 753860
Implantable Lead Model: 5076
Implantable Lead Model: 5076
Implantable Pulse Generator Implant Date: 20191024
Lead Channel Impedance Value: 304 Ohm
Lead Channel Impedance Value: 323 Ohm
Lead Channel Impedance Value: 399 Ohm
Lead Channel Impedance Value: 418 Ohm
Lead Channel Pacing Threshold Amplitude: 0.625 V
Lead Channel Pacing Threshold Amplitude: 0.75 V
Lead Channel Pacing Threshold Pulse Width: 0.4 ms
Lead Channel Pacing Threshold Pulse Width: 0.4 ms
Lead Channel Sensing Intrinsic Amplitude: 0.875 mV
Lead Channel Sensing Intrinsic Amplitude: 0.875 mV
Lead Channel Sensing Intrinsic Amplitude: 13.875 mV
Lead Channel Sensing Intrinsic Amplitude: 13.875 mV
Lead Channel Setting Pacing Amplitude: 1.5 V
Lead Channel Setting Pacing Amplitude: 2 V
Lead Channel Setting Pacing Pulse Width: 0.4 ms
Lead Channel Setting Sensing Sensitivity: 1.2 mV

## 2018-12-03 ENCOUNTER — Other Ambulatory Visit: Payer: Medicare Other

## 2018-12-03 ENCOUNTER — Ambulatory Visit (HOSPITAL_COMMUNITY)
Admission: RE | Admit: 2018-12-03 | Discharge: 2018-12-03 | Disposition: A | Discharge status: Home / Self Care | Payer: Medicare Other | Source: Ambulatory Visit | Attending: Physician Assistant | Admitting: Physician Assistant

## 2018-12-03 ENCOUNTER — Other Ambulatory Visit: Payer: Self-pay

## 2018-12-03 ENCOUNTER — Inpatient Hospital Stay: Payer: Medicare Other | Attending: Internal Medicine

## 2018-12-03 DIAGNOSIS — Z79899 Other long term (current) drug therapy: Secondary | ICD-10-CM | POA: Diagnosis not present

## 2018-12-03 DIAGNOSIS — Z7951 Long term (current) use of inhaled steroids: Secondary | ICD-10-CM | POA: Insufficient documentation

## 2018-12-03 DIAGNOSIS — Z888 Allergy status to other drugs, medicaments and biological substances status: Secondary | ICD-10-CM | POA: Insufficient documentation

## 2018-12-03 DIAGNOSIS — C787 Secondary malignant neoplasm of liver and intrahepatic bile duct: Secondary | ICD-10-CM | POA: Insufficient documentation

## 2018-12-03 DIAGNOSIS — E7800 Pure hypercholesterolemia, unspecified: Secondary | ICD-10-CM | POA: Insufficient documentation

## 2018-12-03 DIAGNOSIS — C7B8 Other secondary neuroendocrine tumors: Secondary | ICD-10-CM | POA: Insufficient documentation

## 2018-12-03 DIAGNOSIS — E039 Hypothyroidism, unspecified: Secondary | ICD-10-CM | POA: Diagnosis not present

## 2018-12-03 DIAGNOSIS — I959 Hypotension, unspecified: Secondary | ICD-10-CM | POA: Diagnosis not present

## 2018-12-03 DIAGNOSIS — Z79890 Hormone replacement therapy (postmenopausal): Secondary | ICD-10-CM | POA: Insufficient documentation

## 2018-12-03 DIAGNOSIS — C7A8 Other malignant neuroendocrine tumors: Secondary | ICD-10-CM | POA: Insufficient documentation

## 2018-12-03 DIAGNOSIS — E669 Obesity, unspecified: Secondary | ICD-10-CM | POA: Diagnosis not present

## 2018-12-03 DIAGNOSIS — Z7901 Long term (current) use of anticoagulants: Secondary | ICD-10-CM | POA: Insufficient documentation

## 2018-12-03 DIAGNOSIS — I480 Paroxysmal atrial fibrillation: Secondary | ICD-10-CM | POA: Diagnosis not present

## 2018-12-03 DIAGNOSIS — E785 Hyperlipidemia, unspecified: Secondary | ICD-10-CM | POA: Insufficient documentation

## 2018-12-03 DIAGNOSIS — G40909 Epilepsy, unspecified, not intractable, without status epilepticus: Secondary | ICD-10-CM | POA: Diagnosis not present

## 2018-12-03 DIAGNOSIS — Z792 Long term (current) use of antibiotics: Secondary | ICD-10-CM | POA: Diagnosis not present

## 2018-12-03 DIAGNOSIS — Z8673 Personal history of transient ischemic attack (TIA), and cerebral infarction without residual deficits: Secondary | ICD-10-CM | POA: Diagnosis not present

## 2018-12-03 DIAGNOSIS — F419 Anxiety disorder, unspecified: Secondary | ICD-10-CM | POA: Diagnosis not present

## 2018-12-03 DIAGNOSIS — K219 Gastro-esophageal reflux disease without esophagitis: Secondary | ICD-10-CM | POA: Diagnosis not present

## 2018-12-03 DIAGNOSIS — Z881 Allergy status to other antibiotic agents status: Secondary | ICD-10-CM | POA: Insufficient documentation

## 2018-12-03 DIAGNOSIS — R911 Solitary pulmonary nodule: Secondary | ICD-10-CM | POA: Diagnosis not present

## 2018-12-03 DIAGNOSIS — C7A1 Malignant poorly differentiated neuroendocrine tumors: Secondary | ICD-10-CM | POA: Diagnosis not present

## 2018-12-03 DIAGNOSIS — F329 Major depressive disorder, single episode, unspecified: Secondary | ICD-10-CM | POA: Diagnosis not present

## 2018-12-03 DIAGNOSIS — C7A090 Malignant carcinoid tumor of the bronchus and lung: Secondary | ICD-10-CM | POA: Diagnosis present

## 2018-12-03 LAB — CBC WITH DIFFERENTIAL (CANCER CENTER ONLY)
Abs Immature Granulocytes: 0.04 10*3/uL (ref 0.00–0.07)
Basophils Absolute: 0 10*3/uL (ref 0.0–0.1)
Basophils Relative: 1 %
Eosinophils Absolute: 0.2 10*3/uL (ref 0.0–0.5)
Eosinophils Relative: 6 %
HCT: 36.5 % (ref 36.0–46.0)
Hemoglobin: 11.7 g/dL — ABNORMAL LOW (ref 12.0–15.0)
Immature Granulocytes: 1 %
Lymphocytes Relative: 18 %
Lymphs Abs: 0.8 10*3/uL (ref 0.7–4.0)
MCH: 33.4 pg (ref 26.0–34.0)
MCHC: 32.1 g/dL (ref 30.0–36.0)
MCV: 104.3 fL — ABNORMAL HIGH (ref 80.0–100.0)
Monocytes Absolute: 0.5 10*3/uL (ref 0.1–1.0)
Monocytes Relative: 12 %
Neutro Abs: 2.7 10*3/uL (ref 1.7–7.7)
Neutrophils Relative %: 62 %
Platelet Count: 95 10*3/uL — ABNORMAL LOW (ref 150–400)
RBC: 3.5 MIL/uL — ABNORMAL LOW (ref 3.87–5.11)
RDW: 19.2 % — ABNORMAL HIGH (ref 11.5–15.5)
WBC Count: 4.3 10*3/uL (ref 4.0–10.5)
nRBC: 0 % (ref 0.0–0.2)

## 2018-12-03 LAB — CMP (CANCER CENTER ONLY)
ALT: 10 U/L (ref 0–44)
AST: 19 U/L (ref 15–41)
Albumin: 3.6 g/dL (ref 3.5–5.0)
Alkaline Phosphatase: 127 U/L — ABNORMAL HIGH (ref 38–126)
Anion gap: 8 (ref 5–15)
BUN: 16 mg/dL (ref 8–23)
CO2: 26 mmol/L (ref 22–32)
Calcium: 9.6 mg/dL (ref 8.9–10.3)
Chloride: 104 mmol/L (ref 98–111)
Creatinine: 0.98 mg/dL (ref 0.44–1.00)
GFR, Est AFR Am: >60 mL/min (ref >60)
GFR, Estimated: 54 mL/min — ABNORMAL LOW (ref >60)
Glucose, Bld: 111 mg/dL — ABNORMAL HIGH (ref 70–99)
Potassium: 4.3 mmol/L (ref 3.5–5.1)
Sodium: 138 mmol/L (ref 135–145)
Total Bilirubin: 0.3 mg/dL (ref 0.3–1.2)
Total Protein: 6.9 g/dL (ref 6.5–8.1)

## 2018-12-03 MED ORDER — IOHEXOL 300 MG/ML  SOLN
100.0000 mL | Freq: Once | INTRAMUSCULAR | Status: AC | PRN
  Administered 2018-12-03: 15:00:00 100 mL via INTRAVENOUS

## 2018-12-03 MED ORDER — SODIUM CHLORIDE (PF) 0.9 % IJ SOLN
INTRAMUSCULAR | Status: AC
  Filled 2018-12-03: qty 50

## 2018-12-04 ENCOUNTER — Ambulatory Visit (INDEPENDENT_AMBULATORY_CARE_PROVIDER_SITE_OTHER): Payer: Medicare Other | Admitting: Family Medicine

## 2018-12-04 ENCOUNTER — Encounter: Payer: Self-pay | Admitting: Family Medicine

## 2018-12-04 DIAGNOSIS — N3090 Cystitis, unspecified without hematuria: Secondary | ICD-10-CM | POA: Diagnosis not present

## 2018-12-04 MED ORDER — AMOXICILLIN-POT CLAVULANATE 875-125 MG PO TABS: 1.0000 | ORAL_TABLET | Freq: Two times a day (BID) | ORAL | 0 refills | Status: AC

## 2018-12-04 NOTE — Progress Notes (Signed)
Virtual Visit via telephone Note Due to COVID-19, visit is conducted virtually and was requested by patient. This visit type was conducted due to national recommendations for restrictions regarding the COVID-19 Pandemic (e.g. social distancing) in an effort to limit this patient's exposure and mitigate transmission in our community. All issues noted in this document were discussed and addressed.  A physical exam was not performed with this format.   I connected with Carlena Hurl on 12/04/18 at 0850 by telephone and verified that I am speaking with the correct person using two identifiers. [name] is currently located at home and family is currently with them during visit. The provider, Monia Pouch, FNP is located in their office at time of visit.  I discussed the limitations, risks, security and privacy concerns of performing an evaluation and management service by telephone and the availability of in person appointments. I also discussed with the patient that there may be a patient responsible charge related to this service. The patient expressed understanding and agreed to proceed.  Subjective:  Patient ID: [name], female    DOB: November 07, 1933, 85 y.o.   MRN: [id number]  Chief Complaint:  Dysuria   HPI: [name] is a 85 y.o. female presenting on 12/04/2018 for Dysuria   Pt reports 3 days of dysuria, frequency, and urgency with lower abdominal pressure. No fever, chills, confusion, or weakness. No flank pain or hematuria.   Dysuria   This is a recurrent problem. The current episode started in the past 7 days. The problem occurs every urination. The problem has been gradually worsening. The quality of the pain is described as burning and aching. The pain is at a severity of 4/10. The pain is mild. There has been no fever. She is not sexually active. There is no history of pyelonephritis. Associated symptoms include frequency and urgency. Pertinent negatives include no  chills, discharge, flank pain, hematuria, hesitancy, nausea, possible pregnancy, sweats or vomiting. She has tried nothing for the symptoms.     Relevant past medical, surgical, family, and social history reviewed and updated as indicated.  Allergies and medications reviewed and updated.   Past Medical History:  Diagnosis Date   Acute respiratory failure with hypoxia ([place]) 10/23/2015   Allergic rhinitis    PT. DENIES   Anxiety    Aortic insufficiency    Echo 04/29/2018: EF 65-70, mild AS (mean 13), mod AI, Asc Aorta 42 mm (mildly dilated), mild LAE, PASP 41, pericardium normal in appearance.    Arthritis    NECK   Ataxia    Bradycardia    primarily nocturnal   Burning tongue syndrome 25 years   Cancer ([place]) dx'd 06/2016   liver   Cataract    Cerebellar degeneration ([place])    Chronic urinary tract infection    Complication of anesthesia    low o2 sats, coded 30 years ago   CVA (cerebral infarction) 05/2003   Depression    Encounter for antineoplastic chemotherapy 10/09/2016   Gait disorder    Gastric polyps    GERD (gastroesophageal reflux disease)    Goals of care, counseling/discussion 10/09/2016   High cholesterol    History of pericarditis    Hyperlipidemia    Hypotension    Hypothyroidism    IBS (irritable bowel syndrome)    Obesity    Paroxysmal atrial fibrillation ([name])    chads2vasc score is 6,  she is felt to be a poor candidate for anticoagulation   Pericarditis  Personal history of arterial venous malformation (AVM)    right side of face   Seizure disorder ([place])    Seizures ([place]) 2003   " smelling"- Gabapentin "no problem"   Shortness of breath dyspnea    with exertion   Sick sinus syndrome ([name])    Sternum fx 10/27/2013   Stroke ([place]) 5 years ago   Right side of face weak, slurred speach-    Thyroid disease    TIA (transient ischemic attack)    UTI (lower urinary tract infection) 03/27/2016   "frequently"     Past Surgical History:  Procedure Laterality Date   APPENDECTOMY  85 years old   COLONOSCOPY  2006, 2009   COLONOSCOPY WITH PROPOFOL N/A 03/28/2016   Procedure: COLONOSCOPY WITH PROPOFOL;  Surgeon: Rebekah Mayer, MD;  Location: [facility];  Service: Endoscopy;  Laterality: N/A;   CORONARY ANGIOGRAPHY N/A 05/22/2018   Procedure: CORONARY ANGIOGRAPHY (CATH LAB);  Surgeon: Rebekah Harp, MD;  Location: [facility];  Service: Cardiovascular;  Laterality: N/A;   cyst removed  35 years ago   EP IMPLANTABLE DEVICE N/A 01/06/2015   Procedure: Loop Recorder Insertion;  Surgeon: Rebekah Grayer, MD;  Location: [facility];  Service: Cardiovascular;  Laterality: N/A;   EYE SURGERY Right    Cataract   IR ANGIOGRAM SELECTIVE EACH ADDITIONAL VESSEL  11/28/2016   IR ANGIOGRAM SELECTIVE EACH ADDITIONAL VESSEL  11/28/2016   IR ANGIOGRAM SELECTIVE EACH ADDITIONAL VESSEL  11/28/2016   IR ANGIOGRAM SELECTIVE EACH ADDITIONAL VESSEL  11/28/2016   IR ANGIOGRAM SELECTIVE EACH ADDITIONAL VESSEL  12/13/2016   IR ANGIOGRAM SELECTIVE EACH ADDITIONAL VESSEL  12/13/2016   IR ANGIOGRAM SELECTIVE EACH ADDITIONAL VESSEL  01/09/2017   IR ANGIOGRAM VISCERAL SELECTIVE  11/28/2016   IR ANGIOGRAM VISCERAL SELECTIVE  11/28/2016   IR ANGIOGRAM VISCERAL SELECTIVE  12/13/2016   IR ANGIOGRAM VISCERAL SELECTIVE  12/13/2016   IR ANGIOGRAM VISCERAL SELECTIVE  01/09/2017   IR EMBO ARTERIAL NOT HEMORR HEMANG INC GUIDE ROADMAPPING  11/28/2016   IR EMBO TUMOR ORGAN ISCHEMIA INFARCT INC GUIDE ROADMAPPING  12/13/2016   IR EMBO TUMOR ORGAN ISCHEMIA INFARCT INC GUIDE ROADMAPPING  01/09/2017   IR IVC FILTER PLMT / S&I /IMG GUID/MOD SED  04/24/2017   IR RADIOLOGIST EVAL & MGMT  11/06/2016   IR RADIOLOGIST EVAL & MGMT  01/02/2017   IR RADIOLOGIST EVAL & MGMT  02/05/2017   IR RADIOLOGIST EVAL & MGMT  05/02/2017   IR RADIOLOGIST EVAL & MGMT  10/17/2017   IR US GUIDE VASC ACCESS RIGHT  11/28/2016   IR US GUIDE VASC  ACCESS RIGHT  12/13/2016   IR US GUIDE VASC ACCESS RIGHT  01/09/2017   KNEE ARTHROSCOPY Right 11/14/2006   KNEE ARTHROSCOPY Bilateral 5 and 6 years ago   KNEE ARTHROSCOPY WITH LATERAL MENISECTOMY  07/03/2012   Procedure: KNEE ARTHROSCOPY WITH LATERAL MENISECTOMY;  Surgeon: Magnus Sinning, MD;  Location: [facility];  Service: Orthopedics;  Laterality: Left;  with Partial Lateral Menisectomy and Medial Menisectomy. Shaving of medial and lateral femoral condyles. Shaving of patella. Removal of a loose body   LOOP RECORDER REMOVAL N/A 05/22/2018   Procedure: LOOP RECORDER REMOVAL;  Surgeon: Rebekah Grayer, MD;  Location: [facility];  Service: Cardiovascular;  Laterality: N/A;   PACEMAKER IMPLANT N/A 05/22/2018   MDT Azure XT DR MRI implanted by Dr Rayann Heman for sick sinus syndrome   tibial and fibular internal fixation Left    TOTAL ABDOMINAL  HYSTERECTOMY  85 years old   UPPER GASTROINTESTINAL ENDOSCOPY  2009, 2013    Social History   Socioeconomic History   Marital status: Married    Spouse name: April Colter   Number of children: 6   Years of education: Not on file   Highest education level: 10th grade  Occupational History   Occupation: Nurse, 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   Food insecurity:    Worry: Never true    Inability: Never true   Transportation needs:    Medical: No    Non-medical: No  Tobacco Use   Smoking status: Former Smoker    Packs/day: 0.25    Years: 10.00    Pack years: 2.50    Types: Cigarettes    Last attempt to quit: 07/31/1975    Years since quitting: 43.3   Smokeless tobacco: Never Used  Substance and Sexual Activity   Alcohol use: No    Comment: Rare- maybe a drink ever 2 years   Drug use: No   Sexual activity: Not Currently  Lifestyle   Physical activity:    Days per week: 0 days    Minutes per session: 0 min   Stress: Not at all  Relationships   Social connections:    Talks on phone: More  than three times a week    Gets together: More than three times a week    Attends religious service: Never    Active member of club or organization: No    Attends meetings of clubs or organizations: Never    Relationship status: Married   Intimate partner violence:    Fear of current or ex partner: No    Emotionally abused: No    Physically abused: No    Forced sexual activity: No  Other Topics Concern   Not on file  Social History Narrative   Lives with husband, does have stairs, does not use them. Pt completed 10th grade.    Outpatient Encounter Medications as of 12/04/2018  Medication Sig   amitriptyline (ELAVIL) 25 MG tablet Take 1 tablet (25 mg total) by mouth at bedtime.   amLODipine (NORVASC) 2.5 MG tablet Take 1 tablet (2.5 mg total) by mouth daily.   amoxicillin-clavulanate (AUGMENTIN) 875-125 MG tablet Take 1 tablet by mouth 2 (two) times daily for 7 days.   capecitabine (XELODA) 500 MG tablet TAKE 3 TABLETS (1500MG ) BY MOUTH TWICE DAILY AFTER A MEAL. TAKE ON DAYS 1-14 OF EACH 28 DAY CYCLE   escitalopram (LEXAPRO) 20 MG tablet Take 1 tablet (20 mg total) by mouth at bedtime.   fluticasone (FLONASE) 50 MCG/ACT nasal spray Place 2 sprays into both nostrils at bedtime.   furosemide (LASIX) 20 MG tablet TAKE 1 TABLET DAILY AS NEEDED FOR FLUID RETENTION   gabapentin (NEURONTIN) 400 MG capsule TAKE 1 TO 2 CAPSULES THREE TIMES A DAY   hydroxypropyl methylcellulose / hypromellose (ISOPTO TEARS / GONIOVISC) 2.5 % ophthalmic solution Place 1 drop into both eyes daily.    levofloxacin (LEVAQUIN) 500 MG tablet Take 1 tablet (500 mg total) by mouth daily. For 10 days   levothyroxine (SYNTHROID, LEVOTHROID) 75 MCG tablet Take 1 tablet (75 mcg total) by mouth daily before breakfast.   lovastatin (MEVACOR) 40 MG tablet Take 1 tablet (40 mg total) by mouth at bedtime.   Melatonin 3 MG TBDP Take 3-6 mg by mouth at bedtime as needed.   omeprazole (PRILOSEC) 40 MG capsule Take 1  capsule (40  mg total) by mouth daily.   ondansetron (ZOFRAN) 8 MG tablet Take 1 tablet (8 mg total) by mouth every 8 (eight) hours as needed for nausea or vomiting.   OXcarbazepine (TRILEPTAL) 150 MG tablet Take 1 tablet (150 mg total) by mouth 2 (two) times daily.   oxyCODONE-acetaminophen (PERCOCET) 5-325 MG tablet Take 1 tablet by mouth every 8 (eight) hours as needed for severe pain.   temozolomide (TEMODAR) 140 MG capsule TAKE 2 CAPSULES (280MG ) BY MOUTH DAILY FOR 5 DAYS ON DAYS 10-14 OF EACH 28D CYCLE. MAY TAKE ON EMPTY STOMACH AT BEDTIME TO DECREASE NAUSEA A   XARELTO 20 MG TABS tablet TAKE 1 TABLET DAILY WITH SUPPER   [DISCONTINUED] amoxicillin-clavulanate (AUGMENTIN) 875-125 MG tablet Take 1 tablet by mouth 2 (two) times daily. Take all of this medication   No facility-administered encounter medications on file as of 12/04/2018.     Allergies  Allergen Reactions   Levaquin [Levofloxacin] Shortness Of Breath   Lisinopril Cough    Review of Systems  Constitutional: Negative for activity change, chills, fatigue and fever.  Respiratory: Negative for cough and shortness of breath.   Cardiovascular: Negative for chest pain, palpitations and leg swelling.  Gastrointestinal: Positive for abdominal pain (lower abdominal pressure). Negative for nausea and vomiting.  Genitourinary: Positive for dysuria, frequency and urgency. Negative for decreased urine volume, difficulty urinating, dyspareunia, enuresis, flank pain, genital sores, hematuria, hesitancy, pelvic pain, vaginal bleeding, vaginal discharge and vaginal pain.  Neurological: Negative for weakness.  Psychiatric/Behavioral: Negative for behavioral problems.  All other systems reviewed and are negative.        Observations/Objective: No vital signs or physical exam, this was a telephone or virtual health encounter.  Pt alert and oriented, answers all questions appropriately, and able to speak in full sentences.     Assessment and Plan: Takyla was seen today for dysuria.  Diagnoses and all orders for this visit:  Cystitis Reported symptoms consistent with acute cystitis. Previous cultures reviewed to help determine appropriate antibiotic selection. Will treat with below. Increase water intake and avoid bladder irritants. Report any new or worsening symptoms.  -     amoxicillin-clavulanate (AUGMENTIN) 875-125 MG tablet; Take 1 tablet by mouth 2 (two) times daily for 7 days.     Follow Up Instructions: Return in about 2 weeks (around 12/18/2018), or if symptoms worsen or fail to improve, for UTI.    I discussed the assessment and treatment plan with the patient. The patient was provided an opportunity to ask questions and all were answered. The patient agreed with the plan and demonstrated an understanding of the instructions.   The patient was advised to call back or seek an in-person evaluation if the symptoms worsen or if the condition fails to improve as anticipated.  The above assessment and management plan was discussed with the patient. The patient verbalized understanding of and has agreed to the management plan. Patient is aware to call the clinic if symptoms persist or worsen. Patient is aware when to return to the clinic for a follow-up visit. Patient educated on when it is appropriate to go to the emergency department.    I provided 15 minutes of non-face-to-face time during this encounter. The call started at 0850. The call ended at 0905. The other time was used for coordination of care.    Monia Pouch, FNP-C [facility] [address] [place], [place] [address] [phone]

## 2018-12-05 ENCOUNTER — Telehealth: Payer: Self-pay | Admitting: *Deleted

## 2018-12-05 NOTE — Progress Notes (Signed)
Remote pacemaker transmission.   

## 2018-12-05 NOTE — Telephone Encounter (Signed)
Received message from pt's husband requesting a phone visit on 12/08/18 to review scans from 12/03/18. Husband is concerned that his wife won't remember what is said to her.  She is just scheduled for visit with provider that day. TCT patient and she would prefer phone visit that day as well. [name] Heilingoetter, PA made aware.

## 2018-12-08 ENCOUNTER — Inpatient Hospital Stay (HOSPITAL_BASED_OUTPATIENT_CLINIC_OR_DEPARTMENT_OTHER): Payer: Medicare Other | Admitting: Physician Assistant

## 2018-12-08 ENCOUNTER — Other Ambulatory Visit: Payer: Self-pay | Admitting: Physician Assistant

## 2018-12-08 ENCOUNTER — Encounter: Payer: Self-pay | Admitting: Physician Assistant

## 2018-12-08 DIAGNOSIS — Z5111 Encounter for antineoplastic chemotherapy: Secondary | ICD-10-CM | POA: Diagnosis not present

## 2018-12-08 DIAGNOSIS — C7A8 Other malignant neuroendocrine tumors: Secondary | ICD-10-CM

## 2018-12-08 DIAGNOSIS — C7B8 Other secondary neuroendocrine tumors: Secondary | ICD-10-CM

## 2018-12-08 NOTE — Progress Notes (Signed)
[facility] HEMATOLOGY-ONCOLOGY TeleHEALTH VISIT PROGRESS NOTE   Dr. [name] and I connected with [name] on 12/08/18 at  1:30 PM EDT by telephone and verified that I am speaking with the correct person using two identifiers.   I discussed the limitations, risks, security and privacy concerns of performing an evaluation and management service by telemedicine and the availability of in-person appointments. I also discussed with the patient that there may be a patient responsible charge related to this service. The patient expressed understanding and agreed to proceed.  Other persons participating in the visit and their role in the encounter: Angelica Frandsen (spouse) and Dr. [name] Patient's location: Home  Provider's location: Office  [name], MD [place] [address]  DIAGNOSIS:  1) Metastatic intermediate. Neuroendocrine tumor of lung primary diagnosed in January 2018 and presented with small bilateral pulmonary nodules in addition to multiple liver metastasis. 2) right lower lobe pulmonary embolism diagnosed incidentally on CT scan of the chest on 04/23/2017  PRIOR THERAPY: 1) Status post radio embolization with Y 90 to the liver lesions by interventional radiology. 2) status post IVC filter placement by interventional radiology on 04/24/2017  CURRENT THERAPY: Xeloda 750 MG/M2 twice a day days 1-14 and Temodar 150 MG/M2 days 10-14 every 4 weeks. Status post 30 cycles. She has started cycle #31on May 17th, 2020   INTERVAL HISTORY: Dr. [name] and I connected with the patient, [name], for a telephone visit today. The patient is doing well today. She cancelled her appointment last month due to concerns for the current pandemic and she has been staying home. She continues to tolerate her treatment well without any adverse effects. She denies any new or worsening symptoms. She denies any changes with her breathing including worsening shortness of  breath, chest pain, cough, or hemoptysis. She follows with Dr. Gwenlyn Found from cardiology due to her baseline shortness of breath. She is on lasixs. She denies any nausea, vomiting, diarrhea, or constipation. She denies recent illness or fevers, chills, or night sweats. The patient recently had a restaging CT scan performed. The purpose of her virtual visit today is for evaluation and discuss of her scan results.    MEDICAL HISTORY: Past Medical History:  Diagnosis Date  . Acute respiratory failure with hypoxia ([place]) 10/23/2015  . Allergic rhinitis    PT. DENIES  . Anxiety   . Aortic insufficiency    Echo 04/29/2018: EF 65-70, mild AS (mean 13), mod AI, Asc Aorta 42 mm (mildly dilated), mild LAE, PASP 41, pericardium normal in appearance.   . Arthritis    NECK  . Ataxia   . Bradycardia    primarily nocturnal  . Burning tongue syndrome 25 years  . Cancer ([place]) dx'd 06/2016   liver  . Cataract   . Cerebellar degeneration ([place])   . Chronic urinary tract infection   . Complication of anesthesia    low o2 sats, coded 30 years ago  . CVA (cerebral infarction) 05/2003  . Depression   . Encounter for antineoplastic chemotherapy 10/09/2016  . Gait disorder   . Gastric polyps   . GERD (gastroesophageal reflux disease)   . Goals of care, counseling/discussion 10/09/2016  . High cholesterol   . History of pericarditis   . Hyperlipidemia   . Hypotension   . Hypothyroidism   . IBS (irritable bowel syndrome)   . Obesity   . Paroxysmal atrial fibrillation ([name])    chads2vasc score is 6,  she is  felt to be a poor candidate for anticoagulation  . Pericarditis   . Personal history of arterial venous malformation (AVM)    right side of face  . Seizure disorder ([place])   . Seizures ([place]) 2003   " smelling"- Gabapentin "no problem"  . Shortness of breath dyspnea    with exertion  . Sick sinus syndrome ([place])   . Sternum fx 10/27/2013  . Stroke [facility]) 5 years ago   Right side of face weak, slurred  speach-   . Thyroid disease   . TIA (transient ischemic attack)   . UTI (lower urinary tract infection) 03/27/2016   "frequently"    ALLERGIES:  is allergic to levaquin [levofloxacin] and lisinopril.  MEDICATIONS:  Current Outpatient Medications  Medication Sig Dispense Refill  . amitriptyline (ELAVIL) 25 MG tablet Take 1 tablet (25 mg total) by mouth at bedtime. 90 tablet 1  . amLODipine (NORVASC) 2.5 MG tablet Take 1 tablet (2.5 mg total) by mouth daily. 90 tablet 1  . amoxicillin-clavulanate (AUGMENTIN) 875-125 MG tablet Take 1 tablet by mouth 2 (two) times daily for 7 days. 14 tablet 0  . capecitabine (XELODA) 500 MG tablet TAKE 3 TABLETS (1500MG ) BY MOUTH TWICE DAILY AFTER A MEAL. TAKE ON DAYS 1-14 OF EACH 28 DAY CYCLE 84 tablet 2  . escitalopram (LEXAPRO) 20 MG tablet Take 1 tablet (20 mg total) by mouth at bedtime. 90 tablet 1  . fluticasone (FLONASE) 50 MCG/ACT nasal spray Place 2 sprays into both nostrils at bedtime. 48 g 3  . furosemide (LASIX) 20 MG tablet TAKE 1 TABLET DAILY AS NEEDED FOR FLUID RETENTION 90 tablet 1  . gabapentin (NEURONTIN) 400 MG capsule TAKE 1 TO 2 CAPSULES THREE TIMES A DAY 720 capsule 1  . hydroxypropyl methylcellulose / hypromellose (ISOPTO TEARS / GONIOVISC) 2.5 % ophthalmic solution Place 1 drop into both eyes daily.     Marland Kitchen levofloxacin (LEVAQUIN) 500 MG tablet Take 1 tablet (500 mg total) by mouth daily. For 10 days 10 tablet 0  . levothyroxine (SYNTHROID, LEVOTHROID) 75 MCG tablet Take 1 tablet (75 mcg total) by mouth daily before breakfast. 90 tablet 1  . lovastatin (MEVACOR) 40 MG tablet Take 1 tablet (40 mg total) by mouth at bedtime. 90 tablet 1  . Melatonin 3 MG TBDP Take 3-6 mg by mouth at bedtime as needed. 60 tablet 1  . omeprazole (PRILOSEC) 40 MG capsule Take 1 capsule (40 mg total) by mouth daily. 90 capsule 1  . ondansetron (ZOFRAN) 8 MG tablet Take 1 tablet (8 mg total) by mouth every 8 (eight) hours as needed for nausea or vomiting. 90  tablet 1  . OXcarbazepine (TRILEPTAL) 150 MG tablet Take 1 tablet (150 mg total) by mouth 2 (two) times daily. 180 tablet 3  . oxyCODONE-acetaminophen (PERCOCET) 5-325 MG tablet Take 1 tablet by mouth every 8 (eight) hours as needed for severe pain. 10 tablet 0  . temozolomide (TEMODAR) 140 MG capsule TAKE 2 CAPSULES (280MG ) BY MOUTH DAILY FOR 5 DAYS ON DAYS 10-14 OF EACH 28D CYCLE. MAY TAKE ON EMPTY STOMACH AT BEDTIME TO DECREASE NAUSEA A 10 capsule 2  . XARELTO 20 MG TABS tablet TAKE 1 TABLET DAILY WITH SUPPER 90 tablet 4   No current facility-administered medications for this visit.     SURGICAL HISTORY:  Past Surgical History:  Procedure Laterality Date  . APPENDECTOMY  85 years old  . COLONOSCOPY  2006, 2009  . COLONOSCOPY WITH PROPOFOL N/A 03/28/2016  Procedure: COLONOSCOPY WITH PROPOFOL;  Surgeon: Gatha Mayer, MD;  Location: [facility];  Service: Endoscopy;  Laterality: N/A;  . CORONARY ANGIOGRAPHY N/A 05/22/2018   Procedure: CORONARY ANGIOGRAPHY (CATH LAB);  Surgeon: Lorretta Harp, MD;  Location: [facility];  Service: Cardiovascular;  Laterality: N/A;  . cyst removed  35 years ago  . EP IMPLANTABLE DEVICE N/A 01/06/2015   Procedure: Loop Recorder Insertion;  Surgeon: Thompson Grayer, MD;  Location: [place] [facility];  Service: Cardiovascular;  Laterality: N/A;  . EYE SURGERY Right    Cataract  . IR ANGIOGRAM SELECTIVE EACH ADDITIONAL VESSEL  11/28/2016  . IR ANGIOGRAM SELECTIVE EACH ADDITIONAL VESSEL  11/28/2016  . IR ANGIOGRAM SELECTIVE EACH ADDITIONAL VESSEL  11/28/2016  . IR ANGIOGRAM SELECTIVE EACH ADDITIONAL VESSEL  11/28/2016  . IR ANGIOGRAM SELECTIVE EACH ADDITIONAL VESSEL  12/13/2016  . IR ANGIOGRAM SELECTIVE EACH ADDITIONAL VESSEL  12/13/2016  . IR ANGIOGRAM SELECTIVE EACH ADDITIONAL VESSEL  01/09/2017  . IR ANGIOGRAM VISCERAL SELECTIVE  11/28/2016  . IR ANGIOGRAM VISCERAL SELECTIVE  11/28/2016  . IR ANGIOGRAM VISCERAL SELECTIVE  12/13/2016  . IR ANGIOGRAM VISCERAL  SELECTIVE  12/13/2016  . IR ANGIOGRAM VISCERAL SELECTIVE  01/09/2017  . IR EMBO ARTERIAL NOT HEMORR HEMANG INC GUIDE ROADMAPPING  11/28/2016  . IR EMBO TUMOR ORGAN ISCHEMIA INFARCT INC GUIDE ROADMAPPING  12/13/2016  . IR EMBO TUMOR ORGAN ISCHEMIA INFARCT INC GUIDE ROADMAPPING  01/09/2017  . IR IVC FILTER PLMT / S&I /IMG GUID/MOD SED  04/24/2017  . IR RADIOLOGIST EVAL & MGMT  11/06/2016  . IR RADIOLOGIST EVAL & MGMT  01/02/2017  . IR RADIOLOGIST EVAL & MGMT  02/05/2017  . IR RADIOLOGIST EVAL & MGMT  05/02/2017  . IR RADIOLOGIST EVAL & MGMT  10/17/2017  . IR US GUIDE VASC ACCESS RIGHT  11/28/2016  . IR US GUIDE VASC ACCESS RIGHT  12/13/2016  . IR US GUIDE VASC ACCESS RIGHT  01/09/2017  . KNEE ARTHROSCOPY Right 11/14/2006  . KNEE ARTHROSCOPY Bilateral 5 and 6 years ago  . KNEE ARTHROSCOPY WITH LATERAL MENISECTOMY  07/03/2012   Procedure: KNEE ARTHROSCOPY WITH LATERAL MENISECTOMY;  Surgeon: Magnus Sinning, MD;  Location: [facility];  Service: Orthopedics;  Laterality: Left;  with Partial Lateral Menisectomy and Medial Menisectomy. Shaving of medial and lateral femoral condyles. Shaving of patella. Removal of a loose body  . LOOP RECORDER REMOVAL N/A 05/22/2018   Procedure: LOOP RECORDER REMOVAL;  Surgeon: Thompson Grayer, MD;  Location: [facility];  Service: Cardiovascular;  Laterality: N/A;  . PACEMAKER IMPLANT N/A 05/22/2018   MDT Azure XT DR MRI implanted by Dr Rayann Heman for sick sinus syndrome  . tibial and fibular internal fixation Left   . TOTAL ABDOMINAL HYSTERECTOMY  85 years old  . UPPER GASTROINTESTINAL ENDOSCOPY  2009, 2013    REVIEW OF SYSTEMS:   Review of Systems  Constitutional: Negative for appetite change, chills, fatigue, fever and unexpected weight change.  HENT:   Negative for mouth sores, nosebleeds, sore throat and trouble swallowing.   Eyes: Negative for eye problems and icterus.  Respiratory: Negative for cough, hemoptysis, shortness of breath and wheezing.   Cardiovascular:  Negative for chest pain and leg swelling.  Gastrointestinal: Negative for abdominal pain, constipation, diarrhea, nausea and vomiting.  Genitourinary: Negative for bladder incontinence, difficulty urinating, dysuria, frequency and hematuria.   Musculoskeletal: Negative for back pain, gait problem, neck pain and neck stiffness.  Skin: Negative for itching and rash.  Neurological:  Negative for dizziness, extremity weakness, gait problem, headaches, light-headedness and seizures.  Hematological: Negative for adenopathy. Does not bruise/bleed easily.  Psychiatric/Behavioral: Negative for confusion, depression and sleep disturbance. The patient is not nervous/anxious.     PHYSICAL EXAMINATION:  There were no vitals taken for this visit.  ECOG PERFORMANCE STATUS: 1 - Symptomatic but completely ambulatory   LABORATORY DATA: Lab Results  Component Value Date   WBC 4.3 12/03/2018   HGB 11.7 (L) 12/03/2018   HCT 36.5 12/03/2018   MCV 104.3 (H) 12/03/2018   PLT 95 (L) 12/03/2018      Chemistry      Component Value Date/Time   NA 138 12/03/2018 1303   NA 140 10/10/2018 1548   NA 135 (L) 07/10/2017 1142   K 4.3 12/03/2018 1303   K 4.6 07/10/2017 1142   CL 104 12/03/2018 1303   CO2 26 12/03/2018 1303   CO2 25 07/10/2017 1142   BUN 16 12/03/2018 1303   BUN 14 10/10/2018 1548   BUN 11.4 07/10/2017 1142   CREATININE 0.98 12/03/2018 1303   CREATININE 1.0 07/10/2017 1142      Component Value Date/Time   CALCIUM 9.6 12/03/2018 1303   CALCIUM 9.9 07/10/2017 1142   ALKPHOS 127 (H) 12/03/2018 1303   ALKPHOS 93 07/10/2017 1142   AST 19 12/03/2018 1303   AST 43 (H) 07/10/2017 1142   ALT 10 12/03/2018 1303   ALT 28 07/10/2017 1142   BILITOT 0.3 12/03/2018 1303   BILITOT 0.56 07/10/2017 1142       RADIOGRAPHIC STUDIES:  Ct Chest W Contrast  Result Date: 12/03/2018 CLINICAL DATA:  Metastatic neuroendocrine tumor of the lung with liver metastases. Status post liver radioembolization.  EXAM: CT CHEST, ABDOMEN, AND PELVIS WITH CONTRAST TECHNIQUE: Multidetector CT imaging of the chest, abdomen and pelvis was performed following the standard protocol during bolus administration of intravenous contrast. CONTRAST:  127mL OMNIPAQUE IOHEXOL 300 MG/ML  SOLN COMPARISON:  None. FINDINGS: CT CHEST FINDINGS Cardiovascular: The heart size is normal. No substantial pericardial effusion. Coronary artery calcification is evident. Atherosclerotic calcification is noted in the wall of the thoracic aorta. Ascending thoracic aorta measures 4.2 cm diameter. Left permanent pacemaker evident. Mediastinum/Nodes: No mediastinal lymphadenopathy. There is no hilar lymphadenopathy. There is no axillary lymphadenopathy. Lungs/Pleura: The central tracheobronchial airways are patent. 4 mm right upper lobe nodule (36/6) is stable. 10 mm right upper lobe nodule measured previously is stable on 49/6 today. Additional scattered tiny right upper lobe pulmonary nodules are again noted and also similar to prior. As on the prior study, the patient has multiple tiny nodules scattered in the left lung, stable. 1 of the larger left lung lesions is perifissural measuring 6 mm on 56/6 and stable. No new suspicious pulmonary nodule or mass. No pleural effusion. Musculoskeletal: No worrisome lytic or sclerotic osseous abnormality. CT ABDOMEN PELVIS FINDINGS Hepatobiliary: 14 mm lesion in segment II of the left liver measures minimally smaller than previously. The 12 mm lesion in the dome of the right liver measured previously is stable (42/2). Segment III lesion measured on the prior study at 3.4 x 3.4 cm now measures 3.2 x 3.1 cm. No new liver lesion evident. Tiny layering gallstones noted. No intrahepatic or extrahepatic biliary dilation. Pancreas: No focal mass lesion. No dilatation of the main duct. No intraparenchymal cyst. No peripancreatic edema. Spleen: No splenomegaly. No focal mass lesion. Adrenals/Urinary Tract: No adrenal nodule  or mass. Kidneys unremarkable. No evidence for hydroureter. The urinary bladder appears normal  for the degree of distention. Stomach/Bowel: Stomach is unremarkable. No gastric wall thickening. No evidence of outlet obstruction. Duodenum is normally positioned as is the ligament of Treitz. No small bowel wall thickening. No small bowel dilatation. The terminal ileum is normal. The appendix is not visualized, but there is no edema or inflammation in the region of the cecum. No gross colonic mass. No colonic wall thickening. Vascular/Lymphatic: There is abdominal aortic atherosclerosis without aneurysm. IVC filter visualized in situ There is no gastrohepatic or hepatoduodenal ligament lymphadenopathy. No intraperitoneal or retroperitoneal lymphadenopathy. No pelvic sidewall lymphadenopathy. Reproductive: The uterus is surgically absent. There is no adnexal mass. Other: No intraperitoneal free fluid. Musculoskeletal: No worrisome lytic or sclerotic osseous abnormality. IMPRESSION: 1. Stable exam.  No new or progressive interval findings. 2. Scattered bilateral pulmonary nodules measuring up to 10 mm without change. No new pulmonary nodule evident. 3. Stable appearance of the hepatic metastases. 4. 4.2 cm diameter ascending thoracic aorta. Recommend annual imaging followup by CTA or MRA. This recommendation follows 2010 ACCF/AHA/AATS/ACR/ASA/SCA/SCAI/SIR/STS/SVM Guidelines for the Diagnosis and Management of Patients with Thoracic Aortic Disease. Circulation. 2010; 121: A193-X902. Aortic aneurysm NOS (ICD10-I71.9) 5. Cholelithiasis. Electronically Signed   By: Misty Stanley M.D.   On: 12/03/2018 20:08   Ct Abdomen Pelvis W Contrast  Result Date: 12/03/2018 CLINICAL DATA:  Metastatic neuroendocrine tumor of the lung with liver metastases. Status post liver radioembolization. EXAM: CT CHEST, ABDOMEN, AND PELVIS WITH CONTRAST TECHNIQUE: Multidetector CT imaging of the chest, abdomen and pelvis was performed following the  standard protocol during bolus administration of intravenous contrast. CONTRAST:  174mL OMNIPAQUE IOHEXOL 300 MG/ML  SOLN COMPARISON:  None. FINDINGS: CT CHEST FINDINGS Cardiovascular: The heart size is normal. No substantial pericardial effusion. Coronary artery calcification is evident. Atherosclerotic calcification is noted in the wall of the thoracic aorta. Ascending thoracic aorta measures 4.2 cm diameter. Left permanent pacemaker evident. Mediastinum/Nodes: No mediastinal lymphadenopathy. There is no hilar lymphadenopathy. There is no axillary lymphadenopathy. Lungs/Pleura: The central tracheobronchial airways are patent. 4 mm right upper lobe nodule (36/6) is stable. 10 mm right upper lobe nodule measured previously is stable on 49/6 today. Additional scattered tiny right upper lobe pulmonary nodules are again noted and also similar to prior. As on the prior study, the patient has multiple tiny nodules scattered in the left lung, stable. 1 of the larger left lung lesions is perifissural measuring 6 mm on 56/6 and stable. No new suspicious pulmonary nodule or mass. No pleural effusion. Musculoskeletal: No worrisome lytic or sclerotic osseous abnormality. CT ABDOMEN PELVIS FINDINGS Hepatobiliary: 14 mm lesion in segment II of the left liver measures minimally smaller than previously. The 12 mm lesion in the dome of the right liver measured previously is stable (42/2). Segment III lesion measured on the prior study at 3.4 x 3.4 cm now measures 3.2 x 3.1 cm. No new liver lesion evident. Tiny layering gallstones noted. No intrahepatic or extrahepatic biliary dilation. Pancreas: No focal mass lesion. No dilatation of the main duct. No intraparenchymal cyst. No peripancreatic edema. Spleen: No splenomegaly. No focal mass lesion. Adrenals/Urinary Tract: No adrenal nodule or mass. Kidneys unremarkable. No evidence for hydroureter. The urinary bladder appears normal for the degree of distention. Stomach/Bowel: Stomach  is unremarkable. No gastric wall thickening. No evidence of outlet obstruction. Duodenum is normally positioned as is the ligament of Treitz. No small bowel wall thickening. No small bowel dilatation. The terminal ileum is normal. The appendix is not visualized, but there is no edema or  inflammation in the region of the cecum. No gross colonic mass. No colonic wall thickening. Vascular/Lymphatic: There is abdominal aortic atherosclerosis without aneurysm. IVC filter visualized in situ There is no gastrohepatic or hepatoduodenal ligament lymphadenopathy. No intraperitoneal or retroperitoneal lymphadenopathy. No pelvic sidewall lymphadenopathy. Reproductive: The uterus is surgically absent. There is no adnexal mass. Other: No intraperitoneal free fluid. Musculoskeletal: No worrisome lytic or sclerotic osseous abnormality. IMPRESSION: 1. Stable exam.  No new or progressive interval findings. 2. Scattered bilateral pulmonary nodules measuring up to 10 mm without change. No new pulmonary nodule evident. 3. Stable appearance of the hepatic metastases. 4. 4.2 cm diameter ascending thoracic aorta. Recommend annual imaging followup by CTA or MRA. This recommendation follows 2010 ACCF/AHA/AATS/ACR/ASA/SCA/SCAI/SIR/STS/SVM Guidelines for the Diagnosis and Management of Patients with Thoracic Aortic Disease. Circulation. 2010; 121: B716-R678. Aortic aneurysm NOS (ICD10-I71.9) 5. Cholelithiasis. Electronically Signed   By: Misty Stanley M.D.   On: 12/03/2018 20:08     ASSESSMENT/PLAN:  This is a very pleasant 85 year old Caucasian female with metastatic intermediate grade neuroendocrine carcinoma and questionable lung primary and multiple metastatic liver lesions and pancreatic lesions.  She was diagnosed in January 2018.  She is status post treatment with radio embolization with Y90 to the left and right lobe liver lesions.    She is currently undergoing systemic chemotherapy with Xeloda and Temodar.  She is status post  30 cycles.  The patient recently had a restaging staging CT scan performed.  Dr. [name] personally and independently reviewed the scan and discussed the results with the patient today. The scan did not show any evidence of any disease progression.  Dr. [name] recommends that the patient continue treatment on the same dose.  She will start with cycle #31 on May 17th, 2020 as scheduled.  We will see her back in 6 weeks for evaluation and repeat blood work. The patient was advised to call immediately if she has any concerning symptoms in the interval. The patient voices understanding of current disease status and treatment options and is in agreement with the current care plan. All questions were answered. The patient knows to call the clinic with any problems, questions or concerns. We can certainly see the patient much sooner if necessary  I provided 11 minutes of non face-to-face telephone visit time during this encounter, and > 50% was spent counseling as documented under my assessment & plan.  Cassandra L Heilingoetter, PA-C 12/08/2018 4:20 PM  Orders Placed This Encounter  Procedures  . CBC with Differential (Cancer Center Only)    Standing Status:   Future    Standing Expiration Date:   12/08/2019  . CMP ([place] only)    Standing Status:   Future    Standing Expiration Date:   12/08/2019     Tobe Sos Heilingoetter, PA-C 12/08/18  ADDENDUM: Hematology/Oncology Attending: I will participate in the telephone virtual visit with Ms. Ocain today.  I recommended her care plan. This is a very pleasant 85 years old white female with metastatic neuroendocrine carcinoma and currently on treatment with Xeloda and Temodar status post 30 cycles.  The patient has been tolerating this treatment well with no concerning adverse effects.  She continues to have fatigue and shortness of breath with exertion but no significant change since her last visit. She had repeat CT scan of the chest,  abdomen and pelvis performed recently.  I personally and independently reviewed the scans and discussed the results with the patient and her husband. Her scan showed no concerning  findings for disease progression. I recommended for the patient to continue her current treatment with Temodar and Xeloda and she is expected to start the next cycle on Dec 14, 2018. She will come back for follow-up visit in 6 weeks for evaluation with repeat blood work. She was advised to call immediately if she has any concerning symptoms in the interval.  Disclaimer: This note was dictated with voice recognition software. Similar sounding words can inadvertently be transcribed and may be missed upon review. Eilleen Kempf, MD 12/08/18

## 2018-12-09 ENCOUNTER — Telehealth: Payer: Self-pay | Admitting: Physician Assistant

## 2018-12-09 MED FILL — CAPECITABINE 500 MG TABS: 500 | 14 days supply | Qty: 84 | Fill #2

## 2018-12-09 MED FILL — TEMOZOLOMIDE 140 MG CAPS: 140 | 28 days supply | Qty: 10 | Fill #2

## 2018-12-09 NOTE — Telephone Encounter (Signed)
Scheduled appt per 5/11 los  - sent reminder letter in the mail with appt date and time

## 2018-12-16 ENCOUNTER — Encounter: Payer: Medicare Other | Admitting: *Deleted

## 2018-12-18 ENCOUNTER — Encounter: Payer: Self-pay | Admitting: Family

## 2018-12-18 ENCOUNTER — Ambulatory Visit (INDEPENDENT_AMBULATORY_CARE_PROVIDER_SITE_OTHER): Payer: Medicare Other | Admitting: Family

## 2018-12-18 ENCOUNTER — Other Ambulatory Visit: Payer: Self-pay

## 2018-12-18 DIAGNOSIS — R399 Unspecified symptoms and signs involving the genitourinary system: Secondary | ICD-10-CM

## 2018-12-18 LAB — URINALYSIS, COMPLETE
Bilirubin, UA: NEGATIVE
Glucose, UA: NEGATIVE
Ketones, UA: NEGATIVE
Leukocytes,UA: NEGATIVE
Nitrite, UA: NEGATIVE
Protein,UA: NEGATIVE
Specific Gravity, UA: 1.02 (ref 1.005–1.030)
Urobilinogen, Ur: 0.2 mg/dL (ref 0.2–1.0)
pH, UA: 7 (ref 5.0–7.5)

## 2018-12-18 LAB — MICROSCOPIC EXAMINATION: Bacteria, UA: NONE SEEN

## 2018-12-18 MED ORDER — SULFAMETHOXAZOLE-TRIMETHOPRIM 800-160 MG PO TABS: 1.0000 | ORAL_TABLET | Freq: Two times a day (BID) | ORAL | 0 refills | Status: DC

## 2018-12-18 NOTE — Progress Notes (Signed)
   Virtual Visit via telephone Note  I connected with [name] on 12/18/18 at 10:39 AM by telephone and verified that I am speaking with the correct person using two identifiers. [name] is currently located at home and husband is currently with her during visit. The provider, Evelina Dun, FNP is located in their office at time of visit.  I discussed the limitations, risks, security and privacy concerns of performing an evaluation and management service by telephone and the availability of in person appointments. I also discussed with the patient that there may be a patient responsible charge related to this service. The patient expressed understanding and agreed to proceed.   History and Present Illness:  Dysuria   This is a recurrent problem. The current episode started in the past 7 days. The problem has been rapidly worsening. The quality of the pain is described as burning. The pain is mild. Associated symptoms include flank pain, frequency and urgency. Pertinent negatives include no discharge, hematuria, hesitancy, nausea or vomiting. Associated symptoms comments: Urinary incontinence . She has tried increased fluids (Augmentin) for the symptoms. The treatment provided mild relief. Her past medical history is significant for recurrent UTIs.      Review of Systems  Gastrointestinal: Negative for nausea and vomiting.  Genitourinary: Positive for dysuria, flank pain, frequency and urgency. Negative for hematuria and hesitancy.  All other systems reviewed and are negative.    Observations/Objective: No SOB or distress   Assessment and Plan: 1. UTI symptoms Just completed Augmentin on 12/14/18 Husband will come get urine cup and bring back today for culture Force fluids Start Bactrim today If symptoms worsen or do not improve over the next 38 hours call office!! - sulfamethoxazole-trimethoprim (BACTRIM DS) 800-160 MG tablet; Take 1 tablet by mouth 2 (two) times  daily.  Dispense: 14 tablet; Refill: 0 - Urinalysis, Complete - Urine Culture     I discussed the assessment and treatment plan with the patient. The patient was provided an opportunity to ask questions and all were answered. The patient agreed with the plan and demonstrated an understanding of the instructions.   The patient was advised to call back or seek an in-person evaluation if the symptoms worsen or if the condition fails to improve as anticipated.  The above assessment and management plan was discussed with the patient. The patient verbalized understanding of and has agreed to the management plan. Patient is aware to call the clinic if symptoms persist or worsen. Patient is aware when to return to the clinic for a follow-up visit. Patient educated on when it is appropriate to go to the emergency department.   Time call ended: 10:51 AM   I provided 12 minutes of non-face-to-face time during this encounter.    Evelina Dun, FNP

## 2018-12-19 LAB — URINE CULTURE

## 2018-12-29 ENCOUNTER — Encounter: Payer: Medicare Other | Admitting: *Deleted

## 2018-12-31 ENCOUNTER — Other Ambulatory Visit: Payer: Self-pay | Admitting: Physician Assistant

## 2018-12-31 DIAGNOSIS — C7A8 Other malignant neuroendocrine tumors: Secondary | ICD-10-CM

## 2018-12-31 DIAGNOSIS — Z5111 Encounter for antineoplastic chemotherapy: Secondary | ICD-10-CM

## 2019-01-01 ENCOUNTER — Ambulatory Visit (INDEPENDENT_AMBULATORY_CARE_PROVIDER_SITE_OTHER): Payer: Medicare Other | Admitting: *Deleted

## 2019-01-01 ENCOUNTER — Other Ambulatory Visit: Payer: Self-pay

## 2019-01-01 DIAGNOSIS — Z0000 Encounter for general adult medical examination without abnormal findings: Secondary | ICD-10-CM | POA: Diagnosis not present

## 2019-01-01 NOTE — Patient Instructions (Signed)
Preventive Care 83 Years and Older, Female Preventive care refers to lifestyle choices and visits with your health care provider that can promote health and wellness. What does preventive care include?  A yearly physical exam. This is also called an annual well check.  Dental exams once or twice a year.  Routine eye exams. Ask your health care provider how often you should have your eyes checked.  Personal lifestyle choices, including: ? Daily care of your teeth and gums. ? Regular physical activity. ? Eating a healthy diet. ? Avoiding tobacco and drug use. ? Limiting alcohol use. ? Practicing safe sex. ? Taking low-dose aspirin every day. ? Taking vitamin and mineral supplements as recommended by your health care provider. What happens during an annual well check? The services and screenings done by your health care provider during your annual well check will depend on your age, overall health, lifestyle risk factors, and family history of disease. Counseling Your health care provider may ask you questions about your:  Alcohol use.  Tobacco use.  Drug use.  Emotional well-being.  Home and relationship well-being.  Sexual activity.  Eating habits.  History of falls.  Memory and ability to understand (cognition).  Work and work Statistician.  Reproductive health.  Screening You may have the following tests or measurements:  Height, weight, and BMI.  Blood pressure.  Lipid and cholesterol levels. These may be checked every 5 years, or more frequently if you are over 30 years old.  Skin check.  Lung cancer screening. You may have this screening every year starting at age 27 if you have a 30-pack-year history of smoking and currently smoke or have quit within the past 15 years.  Colorectal cancer screening. All adults should have this screening starting at age 33 and continuing until age 46. You will have tests every 1-10 years, depending on your results and the  type of screening test. People at increased risk should start screening at an earlier age. Screening tests may include: ? Guaiac-based fecal occult blood testing. ? Fecal immunochemical test (FIT). ? Stool DNA test. ? Virtual colonoscopy. ? Sigmoidoscopy. During this test, a flexible tube with a tiny camera (sigmoidoscope) is used to examine your rectum and lower colon. The sigmoidoscope is inserted through your anus into your rectum and lower colon. ? Colonoscopy. During this test, a long, thin, flexible tube with a tiny camera (colonoscope) is used to examine your entire colon and rectum.  Hepatitis C blood test.  Hepatitis B blood test.  Sexually transmitted disease (STD) testing.  Diabetes screening. This is done by checking your blood sugar (glucose) after you have not eaten for a while (fasting). You may have this done every 1-3 years.  Bone density scan. This is done to screen for osteoporosis. You may have this done starting at age 37.  Mammogram. This may be done every 1-2 years. Talk to your health care provider about how often you should have regular mammograms. Talk with your health care provider about your test results, treatment options, and if necessary, the need for more tests. Vaccines Your health care provider may recommend certain vaccines, such as:  Influenza vaccine. This is recommended every year.  Tetanus, diphtheria, and acellular pertussis (Tdap, Td) vaccine. You may need a Td booster every 10 years.  Varicella vaccine. You may need this if you have not been vaccinated.  Zoster vaccine. You may need this after age 38.  Measles, mumps, and rubella (MMR) vaccine. You may need at least  one dose of MMR if you were born in 1957 or later. You may also need a second dose.  Pneumococcal 13-valent conjugate (PCV13) vaccine. One dose is recommended after age 24.  Pneumococcal polysaccharide (PPSV23) vaccine. One dose is recommended after age 24.  Meningococcal  vaccine. You may need this if you have certain conditions.  Hepatitis A vaccine. You may need this if you have certain conditions or if you travel or work in places where you may be exposed to hepatitis A.  Hepatitis B vaccine. You may need this if you have certain conditions or if you travel or work in places where you may be exposed to hepatitis B.  Haemophilus influenzae type b (Hib) vaccine. You may need this if you have certain conditions. Talk to your health care provider about which screenings and vaccines you need and how often you need them. This information is not intended to replace advice given to you by your health care provider. Make sure you discuss any questions you have with your health care provider. Document Released: 08/12/2015 Document Revised: 09/05/2017 Document Reviewed: 05/17/2015 Elsevier Interactive Patient Education  2019 [employer].

## 2019-01-01 NOTE — Progress Notes (Signed)
MEDICARE ANNUAL WELLNESS VISIT  01/01/2019  Telephone Visit Disclaimer This Medicare AWV was conducted by telephone due to national recommendations for restrictions regarding the COVID-19 Pandemic (e.g. social distancing).  I verified, using two identifiers, that I am speaking with [name] or their authorized healthcare agent. I discussed the limitations, risks, security, and privacy concerns of performing an evaluation and management service by telephone and the potential availability of an in-person appointment in the future. The patient expressed understanding and agreed to proceed.   Subjective:  AVEEN Paul is a 85 y.o. female patient of Stacks, Cletus Gash, MD who had a Medicare Annual Wellness Visit today via telephone. 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 she has 6 children. she reports that she is socially active and does interact with friends/family regularly. she is not physically active and enjoys reading and flowers.  Patient Care Team: [name], MD as PCP - General (Family Medicine) Lorretta Harp, MD as PCP - Cardiology (Cardiology) Thompson Grayer, MD as PCP - Electrophysiology (Cardiology) Curt Bears, MD as Consulting Physician (Oncology) Lorretta Harp, MD as Consulting Physician (Cardiology) Latanya Maudlin, MD as Consulting Physician (Orthopedic Surgery) Pieter Partridge, DO as Consulting Physician (Neurology)  Advanced Directives 01/01/2019 06/17/2018 06/11/2018 05/23/2018 05/20/2018 05/04/2018 03/23/2018  Does Patient Have a Medical Advance Directive? Yes Yes No No No No No  Type of Advance Directive Out of facility DNR (pink MOST or yellow form) [place];Living will;Out of facility DNR (pink MOST or yellow form) - - - - -  Does patient want to make changes to medical advance directive? No - Patient declined No - Patient declined - - - - -  Copy of [place] in Chart? - Yes - validated most recent  copy scanned in chart (See row information) - - - - -  Would patient like information on creating a medical advance directive? No - Patient declined - Yes (MAU/Ambulatory/Procedural Areas - Information given) No - Patient declined - - No - Patient declined  Pre-existing out of facility DNR order (yellow form or pink MOST form) - - - - - - -    Hospital Utilization Over the Past 12 Months: # of hospitalizations or ER visits: 5 # of surgeries: 1  Review of Systems    Patient reports that her overall health is worse compared to last year.  Patient Reported Readings (BP, Pulse, CBG, Weight, etc) none  Review of Systems: No complaints  All other systems negative.  Pain Assessment Pain : No/denies pain     Current Medications & Allergies (verified) Allergies as of 01/01/2019      Reactions   Levaquin [levofloxacin] Shortness Of Breath   Lisinopril Cough      Medication List       Accurate as of January 01, 2019  1:56 PM. If you have any questions, ask your nurse or doctor.        STOP taking these medications   levofloxacin 500 MG tablet Commonly known as:  LEVAQUIN   sulfamethoxazole-trimethoprim 800-160 MG tablet Commonly known as:  Bactrim DS     TAKE these medications   amitriptyline 25 MG tablet Commonly known as:  ELAVIL Take 1 tablet (25 mg total) by mouth at bedtime.   amLODipine 2.5 MG tablet Commonly known as:  NORVASC Take 1 tablet (2.5 mg total) by mouth daily.   capecitabine 500 MG tablet Commonly known as:  XELODA TAKE 3 TABLETS (1500MG )  BY MOUTH TWICE DAILY AFTER A MEAL. TAKE ON DAYS 1-14 OF EACH 28 DAY CYCLE   escitalopram 20 MG tablet Commonly known as:  LEXAPRO Take 1 tablet (20 mg total) by mouth at bedtime.   fluticasone 50 MCG/ACT nasal spray Commonly known as:  FLONASE Place 2 sprays into both nostrils at bedtime.   furosemide 20 MG tablet Commonly known as:  LASIX TAKE 1 TABLET DAILY AS NEEDED FOR FLUID RETENTION   gabapentin 400 MG  capsule Commonly known as:  NEURONTIN TAKE 1 TO 2 CAPSULES THREE TIMES A DAY   hydroxypropyl methylcellulose / hypromellose 2.5 % ophthalmic solution Commonly known as:  ISOPTO TEARS / GONIOVISC Place 1 drop into both eyes daily.   levothyroxine 75 MCG tablet Commonly known as:  SYNTHROID Take 1 tablet (75 mcg total) by mouth daily before breakfast.   lovastatin 40 MG tablet Commonly known as:  MEVACOR Take 1 tablet (40 mg total) by mouth at bedtime.   Melatonin 3 MG Tbdp Take 3-6 mg by mouth at bedtime as needed.   omeprazole 40 MG capsule Commonly known as:  PRILOSEC Take 1 capsule (40 mg total) by mouth daily.   ondansetron 8 MG tablet Commonly known as:  ZOFRAN Take 1 tablet (8 mg total) by mouth every 8 (eight) hours as needed for nausea or vomiting.   OXcarbazepine 150 MG tablet Commonly known as:  TRILEPTAL Take 1 tablet (150 mg total) by mouth 2 (two) times daily.   oxyCODONE-acetaminophen 5-325 MG tablet Commonly known as:  Percocet Take 1 tablet by mouth every 8 (eight) hours as needed for severe pain.   temozolomide 140 MG capsule Commonly known as:  TEMODAR TAKE 2 CAPSULES (280MG ) BY MOUTH DAILY FOR 5 DAYS ON DAYS 10-14 OF EACH 28D CYCLE. MAY TAKE ON EMPTY STOMACH AT BEDTIME TO DECREASE NAUSEA &   Xarelto 20 MG Tabs tablet Generic drug:  rivaroxaban TAKE 1 TABLET DAILY WITH SUPPER       History (reviewed): Past Medical History:  Diagnosis Date   Acute respiratory failure with hypoxia ([place]) 10/23/2015   Allergic rhinitis    PT. DENIES   Anxiety    Aortic insufficiency    Echo 04/29/2018: EF 65-70, mild AS (mean 13), mod AI, Asc Aorta 42 mm (mildly dilated), mild LAE, PASP 41, pericardium normal in appearance.    Arthritis    NECK   Ataxia    Bradycardia    primarily nocturnal   Burning tongue syndrome 25 years   Cancer ([place]) dx'd 06/2016   liver   Cataract    Cerebellar degeneration ([place])    Chronic urinary tract infection     Complication of anesthesia    low o2 sats, coded 30 years ago   CVA (cerebral infarction) 05/2003   Depression    Encounter for antineoplastic chemotherapy 10/09/2016   Gait disorder    Gastric polyps    GERD (gastroesophageal reflux disease)    Goals of care, counseling/discussion 10/09/2016   High cholesterol    History of pericarditis    Hyperlipidemia    Hypotension    Hypothyroidism    IBS (irritable bowel syndrome)    Obesity    Paroxysmal atrial fibrillation ([name])    chads2vasc score is 6,  she is felt to be a poor candidate for anticoagulation   Pericarditis    Personal history of arterial venous malformation (AVM)    right side of face   Seizure disorder ([place])    Seizures ([place]) 2003   "  smelling"- Gabapentin "no problem"   Shortness of breath dyspnea    with exertion   Sick sinus syndrome ([name])    Sternum fx 10/27/2013   Stroke ([place]) 5 years ago   Right side of face weak, slurred speach-    Thyroid disease    TIA (transient ischemic attack)    UTI (lower urinary tract infection) 03/27/2016   "frequently"   Past Surgical History:  Procedure Laterality Date   APPENDECTOMY  85 years old   COLONOSCOPY  2006, 2009   COLONOSCOPY WITH PROPOFOL N/A 03/28/2016   Procedure: COLONOSCOPY WITH PROPOFOL;  Surgeon: Gatha Mayer, MD;  Location: [facility];  Service: Endoscopy;  Laterality: N/A;   CORONARY ANGIOGRAPHY N/A 05/22/2018   Procedure: CORONARY ANGIOGRAPHY (CATH LAB);  Surgeon: Lorretta Harp, MD;  Location: [facility];  Service: Cardiovascular;  Laterality: N/A;   cyst removed  35 years ago   EP IMPLANTABLE DEVICE N/A 01/06/2015   Procedure: Loop Recorder Insertion;  Surgeon: Thompson Grayer, MD;  Location: [facility];  Service: Cardiovascular;  Laterality: N/A;   EYE SURGERY Right    Cataract   IR ANGIOGRAM SELECTIVE EACH ADDITIONAL VESSEL  11/28/2016   IR ANGIOGRAM SELECTIVE EACH ADDITIONAL VESSEL  11/28/2016   IR  ANGIOGRAM SELECTIVE EACH ADDITIONAL VESSEL  11/28/2016   IR ANGIOGRAM SELECTIVE EACH ADDITIONAL VESSEL  11/28/2016   IR ANGIOGRAM SELECTIVE EACH ADDITIONAL VESSEL  12/13/2016   IR ANGIOGRAM SELECTIVE EACH ADDITIONAL VESSEL  12/13/2016   IR ANGIOGRAM SELECTIVE EACH ADDITIONAL VESSEL  01/09/2017   IR ANGIOGRAM VISCERAL SELECTIVE  11/28/2016   IR ANGIOGRAM VISCERAL SELECTIVE  11/28/2016   IR ANGIOGRAM VISCERAL SELECTIVE  12/13/2016   IR ANGIOGRAM VISCERAL SELECTIVE  12/13/2016   IR ANGIOGRAM VISCERAL SELECTIVE  01/09/2017   IR EMBO ARTERIAL NOT HEMORR HEMANG INC GUIDE ROADMAPPING  11/28/2016   IR EMBO TUMOR ORGAN ISCHEMIA INFARCT INC GUIDE ROADMAPPING  12/13/2016   IR EMBO TUMOR ORGAN ISCHEMIA INFARCT INC GUIDE ROADMAPPING  01/09/2017   IR IVC FILTER PLMT / S&I /IMG GUID/MOD SED  04/24/2017   IR RADIOLOGIST EVAL & MGMT  11/06/2016   IR RADIOLOGIST EVAL & MGMT  01/02/2017   IR RADIOLOGIST EVAL & MGMT  02/05/2017   IR RADIOLOGIST EVAL & MGMT  05/02/2017   IR RADIOLOGIST EVAL & MGMT  10/17/2017   IR US GUIDE VASC ACCESS RIGHT  11/28/2016   IR US GUIDE VASC ACCESS RIGHT  12/13/2016   IR US GUIDE VASC ACCESS RIGHT  01/09/2017   KNEE ARTHROSCOPY Right 11/14/2006   KNEE ARTHROSCOPY Bilateral 5 and 6 years ago   KNEE ARTHROSCOPY WITH LATERAL MENISECTOMY  07/03/2012   Procedure: KNEE ARTHROSCOPY WITH LATERAL MENISECTOMY;  Surgeon: Magnus Sinning, MD;  Location: [facility];  Service: Orthopedics;  Laterality: Left;  with Partial Lateral Menisectomy and Medial Menisectomy. Shaving of medial and lateral femoral condyles. Shaving of patella. Removal of a loose body   LOOP RECORDER REMOVAL N/A 05/22/2018   Procedure: LOOP RECORDER REMOVAL;  Surgeon: Thompson Grayer, MD;  Location: [facility];  Service: Cardiovascular;  Laterality: N/A;   PACEMAKER IMPLANT N/A 05/22/2018   MDT Azure XT DR MRI implanted by Dr Rayann Heman for sick sinus syndrome   tibial and fibular internal fixation Left    TOTAL ABDOMINAL  HYSTERECTOMY  85 years old   UPPER GASTROINTESTINAL ENDOSCOPY  2009, 2013   Family History  Problem Relation Age of Onset   Heart attack Father 71  fatal   Coronary artery disease Brother    Diabetes Brother    Prostate cancer Brother    Prostate cancer Son    Social History   Socioeconomic History   Marital status: Married    Spouse name: Mike Berntsen   Number of children: 6   Years of education: 10   Highest education level: 10th grade  Occupational History   Occupation: Nurse, 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   Food insecurity:    Worry: Never true    Inability: Never true   Transportation needs:    Medical: No    Non-medical: No  Tobacco Use   Smoking status: Former Smoker    Packs/day: 0.25    Years: 10.00    Pack years: 2.50    Types: Cigarettes    Last attempt to quit: 07/31/1975    Years since quitting: 43.4   Smokeless tobacco: Never Used  Substance and Sexual Activity   Alcohol use: No    Comment: Rare- maybe a drink ever 2 years   Drug use: No   Sexual activity: Not Currently  Lifestyle   Physical activity:    Days per week: 0 days    Minutes per session: 0 min   Stress: Not at all  Relationships   Social connections:    Talks on phone: More than three times a week    Gets together: More than three times a week    Attends religious service: Never    Active member of club or organization: No    Attends meetings of clubs or organizations: Never    Relationship status: Married  Other Topics Concern   Not on file  Social History Narrative   Lives with husband, does have stairs, does not use them. Pt completed 10th grade.    Activities of Daily Living In your present state of health, do you have any difficulty performing the following activities: 01/01/2019 06/04/2018  Hearing? N N  Vision? Y N  Comment even when wearing her glasses she still has difficulty with her vision -  Difficulty  concentrating or making decisions? Tempie Donning  Comment husband helps with all decision making -  Walking or climbing stairs? Y -  Comment pt wheelchair bound -  Dressing or bathing? Tempie Donning  Comment husband helps with dressing and bathing -  Doing errands, shopping? Tempie Donning  Comment husband runs all errands -  Conservation officer, nature and eating ? Y Y  Comment she can feed herself but husband cooks -  Using the Toilet? Tempie Donning  Comment husband has to help -  In the past six months, have you accidently leaked urine? Tempie Donning  Comment wears depends all the time -  Do you have problems with loss of bowel control? N Y  Managing your Medications? Y Y  Comment husband gives her her medications -  Managing your Finances? Tempie Donning  Comment husband does all the managing of finances -  Housekeeping or managing your Housekeeping? Tempie Donning  Comment husband takes care of all the housekeeping -  Some recent data might be hidden    Patient Literacy How often do you need to have someone help you when you read instructions, pamphlets, or other written materials from your doctor or pharmacy?: 1 - Never What is the last grade level you completed in school?: 10th grade  Exercise Current Exercise Habits: The patient does not participate in regular exercise at present, Exercise limited  by: neurologic condition(s);orthopedic condition(s);respiratory conditions(s)  Diet Patient reports consuming 2 meals a day and 1 snack(s) a day Patient reports that her primary diet is: Regular Patient reports that she does have regular access to food.   Depression Screen PHQ 2/9 Scores 01/01/2019 12/04/2018 10/10/2018 07/10/2018 06/04/2018 05/16/2018 05/12/2018  PHQ - 2 Score 0 0 0 0 3 2 2   PHQ- 9 Score - - - - 16 14 14   Exception Documentation - - - - - - -     Fall Risk Fall Risk  01/01/2019 10/10/2018 09/04/2018 07/10/2018 06/11/2018  Falls in the past year? Exclusion - non ambulatory 0 0 1 1  Number falls in past yr: - - - 1 -  Injury with Fall? - - - 0 -    Risk Factor Category  - - - - -  Risk for fall due to : - - - - -  Follow up - - Falls evaluation completed - -     Objective:  [name] seemed alert and oriented and she participated appropriately during our telephone visit.  Blood Pressure Weight BMI  BP Readings from Last 3 Encounters:  10/10/18 139/64  09/30/18 (!) 146/55  09/04/18 (!) 112/56   Wt Readings from Last 3 Encounters:  10/10/18 224 lb 6 oz (101.8 kg)  09/30/18 217 lb 12.8 oz (98.8 kg)  09/04/18 225 lb (102.1 kg)   BMI Readings from Last 1 Encounters:  10/10/18 38.51 kg/m    *Unable to obtain current vital signs, weight, and BMI due to telephone visit type  Hearing/Vision   [name] did not seem to have difficulty with hearing/understanding during the telephone conversation  Reports that she has not had a formal eye exam by an eye care professional within the past year  Reports that she has not had a formal hearing evaluation within the past year *Unable to fully assess hearing and vision during telephone visit type  Cognitive Function: 6CIT Screen 01/01/2019  What Year? 4 points  What month? 0 points  What time? 0 points  Count back from 20 4 points  Months in reverse 4 points  Repeat phrase 6 points  Total Score 18    Normal Cognitive Function Screening: No: pt scored 18 on the screening-her husband is her full time caregiver  (Normal:0-7, Significant for Dysfunction: >8)  Immunization & Health Maintenance Record Immunization History  Administered Date(s) Administered   Influenza, High Dose Seasonal PF 04/25/2017, 04/23/2018   Influenza,inj,Quad PF,6+ Mos 05/27/2013, 05/03/2014, 05/24/2015, 05/01/2016   Pneumococcal Conjugate-13 05/24/2015   Pneumococcal Polysaccharide-23 11/27/2017   Td 02/27/2005   Tdap 11/27/2017   Zoster 10/12/2014    Health Maintenance  Topic Date Due   DEXA SCAN  03/24/2001   INFLUENZA VACCINE  02/28/2019   TETANUS/TDAP  11/28/2027   PNA vac Low  Risk Adult  Completed       Assessment  This is a routine wellness examination for [name].  Health Maintenance: Due or Overdue Health Maintenance Due  Topic Date Due   DEXA SCAN  03/24/2001    [name] does not need a referral for Community Assistance: Care Management:   no Social Work:    no Prescription Assistance:  no Nutrition/Diabetes Education:  no   Plan:  Personalized Goals Goals Addressed            This Visit's Progress    DIET - INCREASE WATER INTAKE        Personalized Health Maintenance &  Screening Recommendations  Bone densitometry screening-pt declined  Lung Cancer Screening Recommended: no (Low Dose CT Chest recommended if Age 71-80 years, 30 pack-year currently smoking OR have quit w/in past 15 years) Hepatitis C Screening recommended: no HIV Screening recommended: no  Advanced Directives: Written information was not prepared per patient's request.  Referrals & Orders No orders of the defined types were placed in this encounter.   Follow-up Plan  Follow-up with [name], MD as planned    I have personally reviewed and noted the following in the patients chart:    Medical and social history  Use of alcohol, tobacco or illicit drugs   Current medications and supplements  Functional ability and status  Nutritional status  Physical activity  Advanced directives  List of other physicians  Hospitalizations, surgeries, and ER visits in previous 12 months  Vitals  Screenings to include cognitive, depression, and falls  Referrals and appointments  In addition, I have reviewed and discussed with [name] certain preventive protocols, quality metrics, and best practice recommendations. A written personalized care plan for preventive services as well as general preventive health recommendations is available and can be mailed to the patient at her request.      Marylin Crosby  01/01/2019

## 2019-01-05 ENCOUNTER — Encounter: Payer: Medicare Other | Admitting: *Deleted

## 2019-01-06 ENCOUNTER — Ambulatory Visit: Payer: Medicare Other | Admitting: Neurology

## 2019-01-07 MED FILL — TEMOZOLOMIDE 140 MG CAPS: 140 | 28 days supply | Qty: 10 | Fill #0

## 2019-01-07 MED FILL — CAPECITABINE 500 MG TABS: 500 | 14 days supply | Qty: 84 | Fill #0

## 2019-01-09 ENCOUNTER — Other Ambulatory Visit: Payer: Self-pay

## 2019-01-12 ENCOUNTER — Ambulatory Visit (INDEPENDENT_AMBULATORY_CARE_PROVIDER_SITE_OTHER): Payer: Medicare Other | Admitting: Family Medicine

## 2019-01-12 ENCOUNTER — Encounter: Payer: Self-pay | Admitting: Family Medicine

## 2019-01-12 ENCOUNTER — Other Ambulatory Visit: Payer: Self-pay

## 2019-01-12 DIAGNOSIS — K219 Gastro-esophageal reflux disease without esophagitis: Secondary | ICD-10-CM | POA: Diagnosis not present

## 2019-01-12 DIAGNOSIS — I10 Essential (primary) hypertension: Secondary | ICD-10-CM | POA: Diagnosis not present

## 2019-01-12 DIAGNOSIS — I480 Paroxysmal atrial fibrillation: Secondary | ICD-10-CM

## 2019-01-12 DIAGNOSIS — I2782 Chronic pulmonary embolism: Secondary | ICD-10-CM | POA: Diagnosis not present

## 2019-01-12 DIAGNOSIS — G40909 Epilepsy, unspecified, not intractable, without status epilepticus: Secondary | ICD-10-CM | POA: Diagnosis not present

## 2019-01-12 DIAGNOSIS — E039 Hypothyroidism, unspecified: Secondary | ICD-10-CM | POA: Diagnosis not present

## 2019-01-12 NOTE — Progress Notes (Signed)
Subjective:    Patient ID: [name], female    DOB: Aug 19, 1933, 85 y.o.   MRN: [id number]   HPI: [name] is a 85 y.o. female presenting for Patient in for follow-up of atrial fibrillation. Patient denies any recent bouts of chest pain or palpitations. Additionally, patient is taking anticoagulants. Patient denies any recent excessive bleeding episodes including epistaxis, bleeding from the gums, genitalia, rectal bleeding or hematuria. Additionally there has been no excessive bruising.  Patient presents for follow-up on  thyroid. The patient has a history of hypothyroidism for many years. It has been stable recently. Pt. denies any change in  voice, loss of hair, heat or cold intolerance. Energy level has been adequate to good. Patient denies constipation and diarrhea. No myxedema. Medication is as noted below. Verified that pt is taking it daily on an empty stomach. Well tolerated.  Patient also takes medication for seizure denies recent seizure activity.  She does have some pains in the left side of her face.  She tells me that she was recently prescribed Lamictal for that. Patient in for follow-up of GERD. Currently asymptomatic taking  PPI daily. There is no chest pain or heartburn. No hematemesis and no melena. No dysphagia or choking. Onset is remote. Progression is stable. Complicating factors, none.    Depression screen [facility] 2/9 01/01/2019 12/04/2018 10/10/2018 07/10/2018 06/04/2018  Decreased Interest 0 0 0 0 2  Down, Depressed, Hopeless 0 0 0 0 1  PHQ - 2 Score 0 0 0 0 3  Altered sleeping - - - - 2  Tired, decreased energy - - - - 2  Change in appetite - - - - 2  Feeling bad or failure about yourself  - - - - 1  Trouble concentrating - - - - 2  Moving slowly or fidgety/restless - - - - 3  Suicidal thoughts - - - - 1  PHQ-9 Score - - - - 16  Difficult doing work/chores - - - - Somewhat difficult  Some recent data might be hidden     Relevant past medical,  surgical, family and social history reviewed and updated as indicated.  Interim medical history since our last visit reviewed. Allergies and medications reviewed and updated.  ROS:  Review of Systems  Constitutional: Negative.   HENT: Negative for congestion.   Eyes: Negative for visual disturbance.  Respiratory: Negative for shortness of breath.   Cardiovascular: Negative for chest pain.  Gastrointestinal: Negative for abdominal pain, constipation, diarrhea, nausea and vomiting.  Genitourinary: Negative for difficulty urinating.  Musculoskeletal: Negative for arthralgias and myalgias.  Neurological: Negative for headaches.  Psychiatric/Behavioral: Negative for sleep disturbance.     Social History   Tobacco Use  Smoking Status Former Smoker  . Packs/day: 0.25  . Years: 10.00  . Pack years: 2.50  . Types: Cigarettes  . Quit date: 07/31/1975  . Years since quitting: 43.4  Smokeless Tobacco Never Used       Objective:     Wt Readings from Last 3 Encounters:  10/10/18 224 lb 6 oz (101.8 kg)  09/30/18 217 lb 12.8 oz (98.8 kg)  09/04/18 225 lb (102.1 kg)     Exam deferred. Pt. Harboring due to COVID 19. Phone visit performed.   Assessment & Plan:   1. Other chronic pulmonary embolism without acute cor pulmonale ([name])   2. Essential hypertension   3. Gastroesophageal reflux disease without esophagitis   4. Seizure disorder ([place])   5. Hypothyroidism,  unspecified type   6. Paroxysmal atrial fibrillation ([name])     No orders of the defined types were placed in this encounter.   No orders of the defined types were placed in this encounter.     Diagnoses and all orders for this visit:  Other chronic pulmonary embolism without acute cor pulmonale ([name])  Essential hypertension  Gastroesophageal reflux disease without esophagitis  Seizure disorder ([name])  Hypothyroidism, unspecified type  Paroxysmal atrial fibrillation ([place])   All appear stable currently.  Virtual Visit via telephone Note  I discussed the limitations, risks, security and privacy concerns of performing an evaluation and management service by telephone and the availability of in person appointments. The patient was identified with two identifiers. Pt.expressed understanding and agreed to proceed. Pt. Is at home. Dr. [name] is in his office.  Follow Up Instructions:   I discussed the assessment and treatment plan with the patient. The patient was provided an opportunity to ask questions and all were answered. The patient agreed with the plan and demonstrated an understanding of the instructions.   The patient was advised to call back or seek an in-person evaluation if the symptoms worsen or if the condition fails to improve as anticipated.   Total minutes including chart review and phone contact time: 20   Follow up plan: Return in about 3 months (around 04/14/2019).  [name], MD [place]

## 2019-01-20 ENCOUNTER — Encounter: Payer: Self-pay | Admitting: Internal Medicine

## 2019-01-20 ENCOUNTER — Inpatient Hospital Stay: Payer: Medicare Other | Attending: Internal Medicine | Admitting: Internal Medicine

## 2019-01-20 ENCOUNTER — Telehealth: Payer: Self-pay | Admitting: Internal Medicine

## 2019-01-20 ENCOUNTER — Other Ambulatory Visit: Payer: Self-pay

## 2019-01-20 ENCOUNTER — Inpatient Hospital Stay: Payer: Medicare Other

## 2019-01-20 VITALS — BP 140/53 | HR 60 | Temp 98.5°F | Resp 20 | Ht 64.0 in | Wt 222.8 lb

## 2019-01-20 DIAGNOSIS — C7A090 Malignant carcinoid tumor of the bronchus and lung: Secondary | ICD-10-CM | POA: Diagnosis not present

## 2019-01-20 DIAGNOSIS — C787 Secondary malignant neoplasm of liver and intrahepatic bile duct: Secondary | ICD-10-CM | POA: Insufficient documentation

## 2019-01-20 DIAGNOSIS — C3492 Malignant neoplasm of unspecified part of left bronchus or lung: Secondary | ICD-10-CM

## 2019-01-20 DIAGNOSIS — Z79899 Other long term (current) drug therapy: Secondary | ICD-10-CM | POA: Insufficient documentation

## 2019-01-20 DIAGNOSIS — I10 Essential (primary) hypertension: Secondary | ICD-10-CM

## 2019-01-20 DIAGNOSIS — D696 Thrombocytopenia, unspecified: Secondary | ICD-10-CM

## 2019-01-20 DIAGNOSIS — C7A8 Other malignant neuroendocrine tumors: Secondary | ICD-10-CM | POA: Insufficient documentation

## 2019-01-20 DIAGNOSIS — E039 Hypothyroidism, unspecified: Secondary | ICD-10-CM

## 2019-01-20 LAB — CBC WITH DIFFERENTIAL (CANCER CENTER ONLY)
Abs Immature Granulocytes: 0.02 10*3/uL (ref 0.00–0.07)
Basophils Absolute: 0 10*3/uL (ref 0.0–0.1)
Basophils Relative: 1 %
Eosinophils Absolute: 0.2 10*3/uL (ref 0.0–0.5)
Eosinophils Relative: 6 %
HCT: 32.2 % — ABNORMAL LOW (ref 36.0–46.0)
Hemoglobin: 10.8 g/dL — ABNORMAL LOW (ref 12.0–15.0)
Immature Granulocytes: 1 %
Lymphocytes Relative: 20 %
Lymphs Abs: 0.7 10*3/uL (ref 0.7–4.0)
MCH: 32.7 pg (ref 26.0–34.0)
MCHC: 33.5 g/dL (ref 30.0–36.0)
MCV: 97.6 fL (ref 80.0–100.0)
Monocytes Absolute: 0.6 10*3/uL (ref 0.1–1.0)
Monocytes Relative: 19 %
Neutro Abs: 1.8 10*3/uL (ref 1.7–7.7)
Neutrophils Relative %: 53 %
Platelet Count: 96 10*3/uL — ABNORMAL LOW (ref 150–400)
RBC: 3.3 MIL/uL — ABNORMAL LOW (ref 3.87–5.11)
RDW: 17.9 % — ABNORMAL HIGH (ref 11.5–15.5)
WBC Count: 3.3 10*3/uL — ABNORMAL LOW (ref 4.0–10.5)
nRBC: 0 % (ref 0.0–0.2)

## 2019-01-20 LAB — CMP (CANCER CENTER ONLY)
ALT: 13 U/L (ref 0–44)
AST: 22 U/L (ref 15–41)
Albumin: 3.6 g/dL (ref 3.5–5.0)
Alkaline Phosphatase: 98 U/L (ref 38–126)
Anion gap: 7 (ref 5–15)
BUN: 16 mg/dL (ref 8–23)
CO2: 25 mmol/L (ref 22–32)
Calcium: 9.5 mg/dL (ref 8.9–10.3)
Chloride: 97 mmol/L — ABNORMAL LOW (ref 98–111)
Creatinine: 1 mg/dL (ref 0.44–1.00)
GFR, Est AFR Am: >60 mL/min (ref >60)
GFR, Estimated: 52 mL/min — ABNORMAL LOW (ref >60)
Glucose, Bld: 103 mg/dL — ABNORMAL HIGH (ref 70–99)
Potassium: 4.8 mmol/L (ref 3.5–5.1)
Sodium: 129 mmol/L — ABNORMAL LOW (ref 135–145)
Total Bilirubin: 0.4 mg/dL (ref 0.3–1.2)
Total Protein: 6.4 g/dL — ABNORMAL LOW (ref 6.5–8.1)

## 2019-01-20 NOTE — Telephone Encounter (Signed)
Gave avs and calendar ° °

## 2019-01-20 NOTE — Progress Notes (Signed)
[place] Telephone:([phone]   Fax:(336) Lee Mont, MD [place] [address]  DIAGNOSIS:  1) Metastatic intermediate. Neuroendocrine tumor of lung primary diagnosed in January 2018 and presented with small bilateral pulmonary nodules in addition to multiple liver metastasis. 2) right lower lobe pulmonary embolism diagnosed incidentally on CT scan of the chest on 04/23/2017  PRIOR THERAPY:  1) Status post radio embolization with Y 90 to the liver lesions by interventional radiology. 2) status post IVC filter placement by interventional radiology on 04/24/2017  CURRENT THERAPY: Xeloda 750 MG/M2 twice a day days 1-14 and Temodar 150 MG/M2 days 10-14 every 4 weeks. Status post 32 cycles. She started cycle #33 on January 10, 2019.  INTERVAL HISTORY: [name] 85 y.o. female returns to the clinic today for follow-up visit.  The patient is feeling fine today with no concerning complaints except for mild fatigue.  She denied having any current chest pain, shortness of breath, cough or hemoptysis.  She denied having any fever or chills.  She has no nausea, vomiting, diarrhea or constipation.  She continues to tolerate her treatment with Xeloda and Temodar fairly well.  She started cycle #recently on January 13, 2019.  She is here today for evaluation and repeat blood work.  MEDICAL HISTORY: Past Medical History:  Diagnosis Date   Acute respiratory failure with hypoxia ([place]) 10/23/2015   Allergic rhinitis    PT. DENIES   Anxiety    Aortic insufficiency    Echo 04/29/2018: EF 65-70, mild AS (mean 13), mod AI, Asc Aorta 42 mm (mildly dilated), mild LAE, PASP 41, pericardium normal in appearance.    Arthritis    NECK   Ataxia    Back pain 12/13/2016   Bradycardia    primarily nocturnal   Burning tongue syndrome 25 years   Cancer ([facility]) dx'd 06/2016   liver   Cataract    Cerebellar degeneration ([facility])     Chest pain 12/13/2016   Atypical chest pain   Chronic urinary tract infection    Complication of anesthesia    low o2 sats, coded 30 years ago   CVA (cerebral infarction) 05/2003   Dehydration with hyponatremia 03/23/2018   Depression    Encounter for antineoplastic chemotherapy 10/09/2016   Fatigue 01/03/2015   Gait disorder    Gastric polyps    GERD (gastroesophageal reflux disease)    Goals of care, counseling/discussion 10/09/2016   H/O: CVA (cerebrovascular accident) 07/14/2016   High cholesterol    History of pericarditis    Hyperlipidemia    Hypotension    Hypothyroidism    IBS (irritable bowel syndrome)    Neuroendocrine cancer ([facility]) 08/23/2016   Obesity    Paroxysmal atrial fibrillation ([name])    chads2vasc score is 6,  she is felt to be a poor candidate for anticoagulation   Pericarditis    Personal history of arterial venous malformation (AVM)    right side of face   Seizure disorder ([facility])    Seizures ([place]) 2003   " smelling"- Gabapentin "no problem"   Shortness of breath dyspnea    with exertion   Sick sinus syndrome ([place])    Sternum fx 10/27/2013   Stroke ([place]) 5 years ago   Right side of face weak, slurred speach-    Thyroid disease    TIA (transient ischemic attack)    UTI (lower urinary tract infection) 03/27/2016   "frequently"  ALLERGIES:  is allergic to levaquin [levofloxacin] and lisinopril.  MEDICATIONS:  Current Outpatient Medications  Medication Sig Dispense Refill   amitriptyline (ELAVIL) 25 MG tablet Take 1 tablet (25 mg total) by mouth at bedtime. 90 tablet 1   amLODipine (NORVASC) 2.5 MG tablet Take 1 tablet (2.5 mg total) by mouth daily. 90 tablet 1   capecitabine (XELODA) 500 MG tablet TAKE 3 TABLETS (1500MG ) BY MOUTH TWICE DAILY AFTER A MEAL. TAKE ON DAYS 1-14 OF EACH 28 DAY CYCLE 84 tablet 2   escitalopram (LEXAPRO) 20 MG tablet Take 1 tablet (20 mg total) by mouth at bedtime. 90 tablet 1   fluticasone  (FLONASE) 50 MCG/ACT nasal spray Place 2 sprays into both nostrils at bedtime. 48 g 3   furosemide (LASIX) 20 MG tablet TAKE 1 TABLET DAILY AS NEEDED FOR FLUID RETENTION 90 tablet 1   gabapentin (NEURONTIN) 400 MG capsule TAKE 1 TO 2 CAPSULES THREE TIMES A DAY 720 capsule 1   hydroxypropyl methylcellulose / hypromellose (ISOPTO TEARS / GONIOVISC) 2.5 % ophthalmic solution Place 1 drop into both eyes daily.      levothyroxine (SYNTHROID, LEVOTHROID) 75 MCG tablet Take 1 tablet (75 mcg total) by mouth daily before breakfast. 90 tablet 1   lovastatin (MEVACOR) 40 MG tablet Take 1 tablet (40 mg total) by mouth at bedtime. 90 tablet 1   Melatonin 3 MG TBDP Take 3-6 mg by mouth at bedtime as needed. 60 tablet 1   omeprazole (PRILOSEC) 40 MG capsule Take 1 capsule (40 mg total) by mouth daily. 90 capsule 1   ondansetron (ZOFRAN) 8 MG tablet Take 1 tablet (8 mg total) by mouth every 8 (eight) hours as needed for nausea or vomiting. 90 tablet 1   OXcarbazepine (TRILEPTAL) 150 MG tablet Take 1 tablet (150 mg total) by mouth 2 (two) times daily. 180 tablet 3   temozolomide (TEMODAR) 140 MG capsule TAKE 2 CAPSULES (280MG ) BY MOUTH DAILY FOR 5 DAYS ON DAYS 10-14 OF EACH 28D CYCLE. MAY TAKE ON EMPTY STOMACH AT BEDTIME TO DECREASE NAUSEA & 10 capsule 2   XARELTO 20 MG TABS tablet TAKE 1 TABLET DAILY WITH SUPPER 90 tablet 4   No current facility-administered medications for this visit.     SURGICAL HISTORY:  Past Surgical History:  Procedure Laterality Date   APPENDECTOMY  85 years old   COLONOSCOPY  2006, 2009   COLONOSCOPY WITH PROPOFOL N/A 03/28/2016   Procedure: COLONOSCOPY WITH PROPOFOL;  Surgeon: Gatha Mayer, MD;  Location: [facility];  Service: Endoscopy;  Laterality: N/A;   CORONARY ANGIOGRAPHY N/A 05/22/2018   Procedure: CORONARY ANGIOGRAPHY (CATH LAB);  Surgeon: Lorretta Harp, MD;  Location: [facility];  Service: Cardiovascular;  Laterality: N/A;   cyst removed  35  years ago   EP IMPLANTABLE DEVICE N/A 01/06/2015   Procedure: Loop Recorder Insertion;  Surgeon: Thompson Grayer, MD;  Location: [facility];  Service: Cardiovascular;  Laterality: N/A;   EYE SURGERY Right    Cataract   IR ANGIOGRAM SELECTIVE EACH ADDITIONAL VESSEL  11/28/2016   IR ANGIOGRAM SELECTIVE EACH ADDITIONAL VESSEL  11/28/2016   IR ANGIOGRAM SELECTIVE EACH ADDITIONAL VESSEL  11/28/2016   IR ANGIOGRAM SELECTIVE EACH ADDITIONAL VESSEL  11/28/2016   IR ANGIOGRAM SELECTIVE EACH ADDITIONAL VESSEL  12/13/2016   IR ANGIOGRAM SELECTIVE EACH ADDITIONAL VESSEL  12/13/2016   IR ANGIOGRAM SELECTIVE EACH ADDITIONAL VESSEL  01/09/2017   IR ANGIOGRAM VISCERAL SELECTIVE  11/28/2016   IR ANGIOGRAM  VISCERAL SELECTIVE  11/28/2016   IR ANGIOGRAM VISCERAL SELECTIVE  12/13/2016   IR ANGIOGRAM VISCERAL SELECTIVE  12/13/2016   IR ANGIOGRAM VISCERAL SELECTIVE  01/09/2017   IR EMBO ARTERIAL NOT HEMORR HEMANG INC GUIDE ROADMAPPING  11/28/2016   IR EMBO TUMOR ORGAN ISCHEMIA INFARCT INC GUIDE ROADMAPPING  12/13/2016   IR EMBO TUMOR ORGAN ISCHEMIA INFARCT INC GUIDE ROADMAPPING  01/09/2017   IR IVC FILTER PLMT / S&I /IMG GUID/MOD SED  04/24/2017   IR RADIOLOGIST EVAL & MGMT  11/06/2016   IR RADIOLOGIST EVAL & MGMT  01/02/2017   IR RADIOLOGIST EVAL & MGMT  02/05/2017   IR RADIOLOGIST EVAL & MGMT  05/02/2017   IR RADIOLOGIST EVAL & MGMT  10/17/2017   IR US GUIDE VASC ACCESS RIGHT  11/28/2016   IR US GUIDE VASC ACCESS RIGHT  12/13/2016   IR US GUIDE VASC ACCESS RIGHT  01/09/2017   KNEE ARTHROSCOPY Right 11/14/2006   KNEE ARTHROSCOPY Bilateral 5 and 6 years ago   KNEE ARTHROSCOPY WITH LATERAL MENISECTOMY  07/03/2012   Procedure: KNEE ARTHROSCOPY WITH LATERAL MENISECTOMY;  Surgeon: Magnus Sinning, MD;  Location: [facility];  Service: Orthopedics;  Laterality: Left;  with Partial Lateral Menisectomy and Medial Menisectomy. Shaving of medial and lateral femoral condyles. Shaving of patella. Removal of a loose body    LOOP RECORDER REMOVAL N/A 05/22/2018   Procedure: LOOP RECORDER REMOVAL;  Surgeon: Thompson Grayer, MD;  Location: [facility];  Service: Cardiovascular;  Laterality: N/A;   PACEMAKER IMPLANT N/A 05/22/2018   MDT Azure XT DR MRI implanted by Dr Rayann Heman for sick sinus syndrome   tibial and fibular internal fixation Left    TOTAL ABDOMINAL HYSTERECTOMY  85 years old   UPPER GASTROINTESTINAL ENDOSCOPY  2009, 2013    REVIEW OF SYSTEMS:  A comprehensive review of systems was negative except for: Constitutional: positive for fatigue Musculoskeletal: positive for muscle weakness   PHYSICAL EXAMINATION: General appearance: alert, cooperative, fatigued and no distress Head: Normocephalic, without obvious abnormality, atraumatic Neck: no adenopathy, no JVD, supple, symmetrical, trachea midline and thyroid not enlarged, symmetric, no tenderness/mass/nodules Lymph nodes: Cervical, supraclavicular, and axillary nodes normal. Resp: clear to auscultation bilaterally Back: symmetric, no curvature. ROM normal. No CVA tenderness. Cardio: regular rate and rhythm, S1, S2 normal, no murmur, click, rub or gallop GI: soft, non-tender; bowel sounds normal; no masses,  no organomegaly Extremities: extremities normal, atraumatic, no cyanosis or edema  ECOG PERFORMANCE STATUS: 1 - Symptomatic but completely ambulatory  Blood pressure (!) 140/53, pulse 60, temperature 98.5 F (36.9 C), temperature source Oral, resp. rate 20, height 5\' 4"  (1.626 m), weight 222 lb 12.8 oz (101.1 kg), SpO2 100 %.  LABORATORY DATA: Lab Results  Component Value Date   WBC 3.3 (L) 01/20/2019   HGB 10.8 (L) 01/20/2019   HCT 32.2 (L) 01/20/2019   MCV 97.6 01/20/2019   PLT 96 (L) 01/20/2019      Chemistry      Component Value Date/Time   NA 138 12/03/2018 1303   NA 140 10/10/2018 1548   NA 135 (L) 07/10/2017 1142   K 4.3 12/03/2018 1303   K 4.6 07/10/2017 1142   CL 104 12/03/2018 1303   CO2 26 12/03/2018 1303   CO2  25 07/10/2017 1142   BUN 16 12/03/2018 1303   BUN 14 10/10/2018 1548   BUN 11.4 07/10/2017 1142   CREATININE 0.98 12/03/2018 1303   CREATININE 1.0 07/10/2017 1142      Component  Value Date/Time   CALCIUM 9.6 12/03/2018 1303   CALCIUM 9.9 07/10/2017 1142   ALKPHOS 127 (H) 12/03/2018 1303   ALKPHOS 93 07/10/2017 1142   AST 19 12/03/2018 1303   AST 43 (H) 07/10/2017 1142   ALT 10 12/03/2018 1303   ALT 28 07/10/2017 1142   BILITOT 0.3 12/03/2018 1303   BILITOT 0.56 07/10/2017 1142       RADIOGRAPHIC STUDIES: No results found.  ASSESSMENT AND PLAN:  This is a very pleasant 85 years old white female with metastatic intermediate grade neuroendocrine carcinoma of questionable lung primary and multiple metastatic liver lesions and pancreatic lesions.She status post treatment with radio embolization with Y90 to the left and right lobe liver lesions.Status post treatment with Y 90 to the left and right lobes of the liver. She is currently undergoing systemic chemotherapy with Xeloda and Temodar status post 32 cycles. The patient continues to tolerate her treatment with Xeloda and Temodar fairly well. I recommended for her to proceed with cycle #6 today as planned. I will see her back for follow-up visit in 6 weeks for evaluation or repeat blood work. She was advised to call immediately if she has any concerning symptoms in the interval. The patient voices understanding of current disease status and treatment options and is in agreement with the current care plan. All questions were answered. The patient knows to call the clinic with any problems, questions or concerns. We can certainly see the patient much sooner if necessary.  Disclaimer: This note was dictated with voice recognition software. Similar sounding words can inadvertently be transcribed and may not be corrected upon review.

## 2019-01-25 DIAGNOSIS — N3001 Acute cystitis with hematuria: Secondary | ICD-10-CM | POA: Diagnosis not present

## 2019-01-25 DIAGNOSIS — R300 Dysuria: Secondary | ICD-10-CM | POA: Diagnosis not present

## 2019-02-03 MED FILL — TEMOZOLOMIDE 140 MG CAPS: 140 | 28 days supply | Qty: 10 | Fill #1

## 2019-02-03 MED FILL — CAPECITABINE 500 MG TABS: 500 | 14 days supply | Qty: 84 | Fill #1

## 2019-02-11 ENCOUNTER — Other Ambulatory Visit: Payer: Medicare Other

## 2019-02-11 ENCOUNTER — Other Ambulatory Visit: Payer: Self-pay

## 2019-02-11 ENCOUNTER — Encounter: Payer: Self-pay | Admitting: Family Medicine

## 2019-02-11 ENCOUNTER — Ambulatory Visit (INDEPENDENT_AMBULATORY_CARE_PROVIDER_SITE_OTHER): Payer: Medicare Other | Admitting: Family Medicine

## 2019-02-11 DIAGNOSIS — R300 Dysuria: Secondary | ICD-10-CM | POA: Diagnosis not present

## 2019-02-11 DIAGNOSIS — N3000 Acute cystitis without hematuria: Secondary | ICD-10-CM

## 2019-02-11 LAB — URINALYSIS, COMPLETE
Bilirubin, UA: NEGATIVE
Glucose, UA: NEGATIVE
Ketones, UA: NEGATIVE
Nitrite, UA: POSITIVE — AB
Protein,UA: NEGATIVE
RBC, UA: NEGATIVE
Specific Gravity, UA: 1.015 (ref 1.005–1.030)
Urobilinogen, Ur: 0.2 mg/dL (ref 0.2–1.0)
pH, UA: 7 (ref 5.0–7.5)

## 2019-02-11 LAB — MICROSCOPIC EXAMINATION
Renal Epithel, UA: NONE SEEN /hpf
WBC, UA: >30 /hpf — AB (ref 0–5)

## 2019-02-11 MED ORDER — CEPHALEXIN 500 MG PO CAPS: 500.0000 mg | ORAL_CAPSULE | Freq: Four times a day (QID) | ORAL | 0 refills | Status: DC

## 2019-02-11 NOTE — Progress Notes (Signed)
   Virtual Visit via telephone Note  I connected with Carlena Hurl on 02/11/19 at 1330 by telephone and verified that I am speaking with the correct person using two identifiers. [name] is currently located at home and no other people are currently with her during visit. The provider, Fransisca Kaufmann , MD is located in their office at time of visit.  Call ended at 1336  I discussed the limitations, risks, security and privacy concerns of performing an evaluation and management service by telephone and the availability of in person appointments. I also discussed with the patient that there may be a patient responsible charge related to this service. The patient expressed understanding and agreed to proceed.   History and Present Illness: Patient is calling in with complaints of frequency and dysuria over the past 3 days.  She has dark and thick urine and she is having urgency. She has seen a urologist and she gets these frequently.  She denies any fevers or chills. She denies any flank pain.  She says is been worsening and last night was really rough on her with the burning of the pain  No diagnosis found.    Review of Systems  Constitutional: Negative for chills and fever.  Eyes: Negative for visual disturbance.  Respiratory: Negative for chest tightness and shortness of breath.   Cardiovascular: Negative for chest pain and leg swelling.  Gastrointestinal: Positive for abdominal pain.  Genitourinary: Positive for dysuria, frequency and urgency. Negative for difficulty urinating, hematuria, vaginal bleeding, vaginal discharge and vaginal pain.  Musculoskeletal: Negative for back pain and gait problem.  Skin: Negative for rash.  Neurological: Negative for light-headedness and headaches.  Psychiatric/Behavioral: Negative for agitation and behavioral problems.  All other systems reviewed and are negative.   Observations/Objective: Patient sounds comfortable and in no acute  distress  Assessment and Plan: Problem List Items Addressed This Visit    None    Visit Diagnoses    Acute cystitis without hematuria    -  Primary   Relevant Medications   cephALEXin (KEFLEX) 500 MG capsule       Follow Up Instructions: As needed if worsens or does not improve    I discussed the assessment and treatment plan with the patient. The patient was provided an opportunity to ask questions and all were answered. The patient agreed with the plan and demonstrated an understanding of the instructions.   The patient was advised to call back or seek an in-person evaluation if the symptoms worsen or if the condition fails to improve as anticipated.  The above assessment and management plan was discussed with the patient. The patient verbalized understanding of and has agreed to the management plan. Patient is aware to call the clinic if symptoms persist or worsen. Patient is aware when to return to the clinic for a follow-up visit. Patient educated on when it is appropriate to go to the emergency department.    I provided 6 minutes of non-face-to-face time during this encounter.    Worthy Rancher, MD

## 2019-02-13 LAB — URINE CULTURE

## 2019-02-25 ENCOUNTER — Ambulatory Visit (INDEPENDENT_AMBULATORY_CARE_PROVIDER_SITE_OTHER): Payer: Medicare Other | Admitting: *Deleted

## 2019-02-25 DIAGNOSIS — I495 Sick sinus syndrome: Secondary | ICD-10-CM | POA: Diagnosis not present

## 2019-02-25 DIAGNOSIS — R001 Bradycardia, unspecified: Secondary | ICD-10-CM

## 2019-02-26 ENCOUNTER — Telehealth: Payer: Self-pay

## 2019-02-26 LAB — CUP PACEART REMOTE DEVICE CHECK
Battery Remaining Longevity: 159 mo
Battery Voltage: 3.1 V
Brady Statistic AP VP Percent: 0.36 %
Brady Statistic AP VS Percent: 97.54 %
Brady Statistic AS VP Percent: 0.01 %
Brady Statistic AS VS Percent: 2.09 %
Brady Statistic RA Percent Paced: 97.91 %
Brady Statistic RV Percent Paced: 0.36 %
Date Time Interrogation Session: 20200730185346
Implantable Lead Implant Date: 20191024
Implantable Lead Implant Date: 20191024
Implantable Lead Location: 753859
Implantable Lead Location: 753860
Implantable Lead Model: 5076
Implantable Lead Model: 5076
Implantable Pulse Generator Implant Date: 20191024
Lead Channel Impedance Value: 304 Ohm
Lead Channel Impedance Value: 304 Ohm
Lead Channel Impedance Value: 380 Ohm
Lead Channel Impedance Value: 475 Ohm
Lead Channel Pacing Threshold Amplitude: 0.625 V
Lead Channel Pacing Threshold Amplitude: 1 V
Lead Channel Pacing Threshold Pulse Width: 0.4 ms
Lead Channel Pacing Threshold Pulse Width: 0.4 ms
Lead Channel Sensing Intrinsic Amplitude: 1.5 mV
Lead Channel Sensing Intrinsic Amplitude: 14.25 mV
Lead Channel Setting Pacing Amplitude: 1.5 V
Lead Channel Setting Pacing Amplitude: 2 V
Lead Channel Setting Pacing Pulse Width: 0.4 ms
Lead Channel Setting Sensing Sensitivity: 1.2 mV

## 2019-02-26 NOTE — Telephone Encounter (Signed)
Spoke with patient to remind of missed remote transmission 

## 2019-03-02 ENCOUNTER — Encounter: Payer: Self-pay | Admitting: Family Medicine

## 2019-03-02 ENCOUNTER — Ambulatory Visit (INDEPENDENT_AMBULATORY_CARE_PROVIDER_SITE_OTHER): Payer: Medicare Other | Admitting: Family Medicine

## 2019-03-02 DIAGNOSIS — N3001 Acute cystitis with hematuria: Secondary | ICD-10-CM

## 2019-03-02 MED ORDER — SULFAMETHOXAZOLE-TRIMETHOPRIM 800-160 MG PO TABS: 1.0000 | ORAL_TABLET | Freq: Two times a day (BID) | ORAL | 0 refills | Status: AC

## 2019-03-02 NOTE — Progress Notes (Signed)
Virtual Visit via telephone Note Due to COVID-19 pandemic this visit was conducted virtually. This visit type was conducted due to national recommendations for restrictions regarding the COVID-19 Pandemic (e.g. social distancing, sheltering in place) in an effort to limit this patient's exposure and mitigate transmission in our community. All issues noted in this document were discussed and addressed.  A physical exam was not performed with this format.   I connected with [name] on 03/02/19 at 1000 by telephone and verified that I am speaking with the correct person using two identifiers. [name] is currently located at home and family is currently with them during visit. The provider, Monia Pouch, FNP is located in their office at time of visit.  I discussed the limitations, risks, security and privacy concerns of performing an evaluation and management service by telephone and the availability of in person appointments. I also discussed with the patient that there may be a patient responsible charge related to this service. The patient expressed understanding and agreed to proceed.  Subjective:  Patient ID: [name], female    DOB: 12-17-1933, 85 y.o.   MRN: [id number]  Chief Complaint:  Urinary Tract Infection   HPI: [name] is a 85 y.o. female presenting on 03/02/2019 for Urinary Tract Infection   Pt reports UTI symptoms for at least 3 days.   Urinary Tract Infection  This is a new problem. The current episode started in the past 7 days. The problem occurs every urination. The problem has been gradually worsening. The quality of the pain is described as burning and shooting. The pain is at a severity of 4/10. The pain is mild. There has been no fever. She is not sexually active. There is no history of pyelonephritis. Associated symptoms include frequency, hematuria and urgency. Pertinent negatives include no chills, discharge, flank pain, hesitancy, nausea,  possible pregnancy, sweats or vomiting. She has tried increased fluids and acetaminophen for the symptoms. The treatment provided no relief. Her past medical history is significant for recurrent UTIs.     Relevant past medical, surgical, family, and social history reviewed and updated as indicated.  Allergies and medications reviewed and updated.   Past Medical History:  Diagnosis Date  . Acute respiratory failure with hypoxia ([place]) 10/23/2015  . Allergic rhinitis    PT. DENIES  . Anxiety   . Aortic insufficiency    Echo 04/29/2018: EF 65-70, mild AS (mean 13), mod AI, Asc Aorta 42 mm (mildly dilated), mild LAE, PASP 41, pericardium normal in appearance.   . Arthritis    NECK  . Ataxia   . Back pain 12/13/2016  . Bradycardia    primarily nocturnal  . Burning tongue syndrome 25 years  . Cancer ([place]) dx'd 06/2016   liver  . Cataract   . Cerebellar degeneration ([place])   . Chest pain 12/13/2016   Atypical chest pain  . Chronic urinary tract infection   . Complication of anesthesia    low o2 sats, coded 30 years ago  . CVA (cerebral infarction) 05/2003  . Dehydration with hyponatremia 03/23/2018  . Depression   . Encounter for antineoplastic chemotherapy 10/09/2016  . Fatigue 01/03/2015  . Gait disorder   . Gastric polyps   . GERD (gastroesophageal reflux disease)   . Goals of care, counseling/discussion 10/09/2016  . H/O: CVA (cerebrovascular accident) 07/14/2016  . High cholesterol   . History of pericarditis   . Hyperlipidemia   . Hypotension   . Hypothyroidism   .  IBS (irritable bowel syndrome)   . Neuroendocrine cancer ([place]) 08/23/2016  . Obesity   . Paroxysmal atrial fibrillation ([name])    chads2vasc score is 6,  she is felt to be a poor candidate for anticoagulation  . Pericarditis   . Personal history of arterial venous malformation (AVM)    right side of face  . Seizure disorder ([place])   . Seizures ([place]) 2003   " smelling"- Gabapentin "no problem"  . Shortness of  breath dyspnea    with exertion  . Sick sinus syndrome ([place])   . Sternum fx 10/27/2013  . Stroke [facility]) 5 years ago   Right side of face weak, slurred speach-   . Thyroid disease   . TIA (transient ischemic attack)   . UTI (lower urinary tract infection) 03/27/2016   "frequently"    Past Surgical History:  Procedure Laterality Date  . APPENDECTOMY  85 years old  . COLONOSCOPY  2006, 2009  . COLONOSCOPY WITH PROPOFOL N/A 03/28/2016   Procedure: COLONOSCOPY WITH PROPOFOL;  Surgeon: Gatha Mayer, MD;  Location: [facility];  Service: Endoscopy;  Laterality: N/A;  . CORONARY ANGIOGRAPHY N/A 05/22/2018   Procedure: CORONARY ANGIOGRAPHY (CATH LAB);  Surgeon: Lorretta Harp, MD;  Location: [facility];  Service: Cardiovascular;  Laterality: N/A;  . cyst removed  35 years ago  . EP IMPLANTABLE DEVICE N/A 01/06/2015   Procedure: Loop Recorder Insertion;  Surgeon: Thompson Grayer, MD;  Location: [facility];  Service: Cardiovascular;  Laterality: N/A;  . EYE SURGERY Right    Cataract  . IR ANGIOGRAM SELECTIVE EACH ADDITIONAL VESSEL  11/28/2016  . IR ANGIOGRAM SELECTIVE EACH ADDITIONAL VESSEL  11/28/2016  . IR ANGIOGRAM SELECTIVE EACH ADDITIONAL VESSEL  11/28/2016  . IR ANGIOGRAM SELECTIVE EACH ADDITIONAL VESSEL  11/28/2016  . IR ANGIOGRAM SELECTIVE EACH ADDITIONAL VESSEL  12/13/2016  . IR ANGIOGRAM SELECTIVE EACH ADDITIONAL VESSEL  12/13/2016  . IR ANGIOGRAM SELECTIVE EACH ADDITIONAL VESSEL  01/09/2017  . IR ANGIOGRAM VISCERAL SELECTIVE  11/28/2016  . IR ANGIOGRAM VISCERAL SELECTIVE  11/28/2016  . IR ANGIOGRAM VISCERAL SELECTIVE  12/13/2016  . IR ANGIOGRAM VISCERAL SELECTIVE  12/13/2016  . IR ANGIOGRAM VISCERAL SELECTIVE  01/09/2017  . IR EMBO ARTERIAL NOT HEMORR HEMANG INC GUIDE ROADMAPPING  11/28/2016  . IR EMBO TUMOR ORGAN ISCHEMIA INFARCT INC GUIDE ROADMAPPING  12/13/2016  . IR EMBO TUMOR ORGAN ISCHEMIA INFARCT INC GUIDE ROADMAPPING  01/09/2017  . IR IVC FILTER PLMT / S&I /IMG GUID/MOD SED   04/24/2017  . IR RADIOLOGIST EVAL & MGMT  11/06/2016  . IR RADIOLOGIST EVAL & MGMT  01/02/2017  . IR RADIOLOGIST EVAL & MGMT  02/05/2017  . IR RADIOLOGIST EVAL & MGMT  05/02/2017  . IR RADIOLOGIST EVAL & MGMT  10/17/2017  . IR US GUIDE VASC ACCESS RIGHT  11/28/2016  . IR US GUIDE VASC ACCESS RIGHT  12/13/2016  . IR US GUIDE VASC ACCESS RIGHT  01/09/2017  . KNEE ARTHROSCOPY Right 11/14/2006  . KNEE ARTHROSCOPY Bilateral 5 and 6 years ago  . KNEE ARTHROSCOPY WITH LATERAL MENISECTOMY  07/03/2012   Procedure: KNEE ARTHROSCOPY WITH LATERAL MENISECTOMY;  Surgeon: Magnus Sinning, MD;  Location: [facility];  Service: Orthopedics;  Laterality: Left;  with Partial Lateral Menisectomy and Medial Menisectomy. Shaving of medial and lateral femoral condyles. Shaving of patella. Removal of a loose body  . LOOP RECORDER REMOVAL N/A 05/22/2018   Procedure: LOOP RECORDER REMOVAL;  Surgeon: Thompson Grayer, MD;  Location: [name];  Service: Cardiovascular;  Laterality: N/A;  . PACEMAKER IMPLANT N/A 05/22/2018   MDT Azure XT DR MRI implanted by Dr Rayann Heman for sick sinus syndrome  . tibial and fibular internal fixation Left   . TOTAL ABDOMINAL HYSTERECTOMY  85 years old  . UPPER GASTROINTESTINAL ENDOSCOPY  2009, 2013    Social History   Socioeconomic History  . Marital status: Married    Spouse name: Annice Jolly  . Number of children: 6  . Years of education: 4  . Highest education level: 10th grade  Occupational History  . Occupation: Agricultural engineer  Social Needs  . Financial resource strain: Not very hard  . Food insecurity    Worry: Never true    Inability: Never true  . Transportation needs    Medical: No    Non-medical: No  Tobacco Use  . Smoking status: Former Smoker    Packs/day: 0.25    Years: 10.00    Pack years: 2.50    Types: Cigarettes    Quit date: 07/31/1975    Years since quitting: 43.6  . Smokeless tobacco: Never Used  Substance and Sexual Activity  . Alcohol use: No    Comment:  Rare- maybe a drink ever 2 years  . Drug use: No  . Sexual activity: Not Currently  Lifestyle  . Physical activity    Days per week: 0 days    Minutes per session: 0 min  . Stress: Not at all  Relationships  . Social connections    Talks on phone: More than three times a week    Gets together: More than three times a week    Attends religious service: Never    Active member of club or organization: No    Attends meetings of clubs or organizations: Never    Relationship status: Married  . Intimate partner violence    Fear of current or ex partner: No    Emotionally abused: No    Physically abused: No    Forced sexual activity: No  Other Topics Concern  . Not on file  Social History Narrative   Lives with husband, does have stairs, does not use them. Pt completed 10th grade.    Outpatient Encounter Medications as of 03/02/2019  Medication Sig  . amitriptyline (ELAVIL) 25 MG tablet Take 1 tablet (25 mg total) by mouth at bedtime.  Marland Kitchen amLODipine (NORVASC) 2.5 MG tablet Take 1 tablet (2.5 mg total) by mouth daily.  . capecitabine (XELODA) 500 MG tablet TAKE 3 TABLETS (1500MG ) BY MOUTH TWICE DAILY AFTER A MEAL. TAKE ON DAYS 1-14 OF EACH 28 DAY CYCLE  . escitalopram (LEXAPRO) 20 MG tablet Take 1 tablet (20 mg total) by mouth at bedtime.  . fluticasone (FLONASE) 50 MCG/ACT nasal spray Place 2 sprays into both nostrils at bedtime.  . furosemide (LASIX) 20 MG tablet TAKE 1 TABLET DAILY AS NEEDED FOR FLUID RETENTION  . gabapentin (NEURONTIN) 400 MG capsule TAKE 1 TO 2 CAPSULES THREE TIMES A DAY  . hydroxypropyl methylcellulose / hypromellose (ISOPTO TEARS / GONIOVISC) 2.5 % ophthalmic solution Place 1 drop into both eyes daily.   Marland Kitchen levothyroxine (SYNTHROID, LEVOTHROID) 75 MCG tablet Take 1 tablet (75 mcg total) by mouth daily before breakfast.  . lovastatin (MEVACOR) 40 MG tablet Take 1 tablet (40 mg total) by mouth at bedtime.  . Melatonin 3 MG TBDP Take 3-6 mg by mouth at bedtime as  needed.  Marland Kitchen omeprazole (PRILOSEC) 40 MG capsule Take  1 capsule (40 mg total) by mouth daily.  . ondansetron (ZOFRAN) 8 MG tablet Take 1 tablet (8 mg total) by mouth every 8 (eight) hours as needed for nausea or vomiting.  . OXcarbazepine (TRILEPTAL) 150 MG tablet Take 1 tablet (150 mg total) by mouth 2 (two) times daily.  Marland Kitchen sulfamethoxazole-trimethoprim (BACTRIM DS) 800-160 MG tablet Take 1 tablet by mouth 2 (two) times daily for 7 days.  Marland Kitchen temozolomide (TEMODAR) 140 MG capsule TAKE 2 CAPSULES (280MG ) BY MOUTH DAILY FOR 5 DAYS ON DAYS 10-14 OF EACH 28D CYCLE. MAY TAKE ON EMPTY STOMACH AT BEDTIME TO DECREASE NAUSEA &  . XARELTO 20 MG TABS tablet TAKE 1 TABLET DAILY WITH SUPPER  . [DISCONTINUED] cephALEXin (KEFLEX) 500 MG capsule Take 1 capsule (500 mg total) by mouth 4 (four) times daily.   No facility-administered encounter medications on file as of 03/02/2019.     Allergies  Allergen Reactions  . Levaquin [Levofloxacin] Shortness Of Breath  . Lisinopril Cough    Review of Systems  Constitutional: Negative for activity change, appetite change, chills, diaphoresis, fatigue, fever and unexpected weight change.  Eyes: Negative for photophobia and visual disturbance.  Respiratory: Negative for cough, chest tightness and shortness of breath.   Cardiovascular: Negative for chest pain, palpitations and leg swelling.  Gastrointestinal: Positive for abdominal pain (lower abdominal pressure). Negative for nausea and vomiting.  Genitourinary: Positive for dysuria, frequency, hematuria and urgency. Negative for decreased urine volume, difficulty urinating, dyspareunia, enuresis, flank pain, genital sores and hesitancy.  Musculoskeletal: Negative for back pain.  Neurological: Negative for dizziness, weakness, light-headedness and headaches.  Psychiatric/Behavioral: Negative for confusion.  All other systems reviewed and are negative.     Urine dropped off by pt. Urinalysis in office: many bacteria, 3+  leukocytes, positive nitrites, trace blood.    Observations/Objective: No vital signs or physical exam, this was a telephone or virtual health encounter.  Pt alert and oriented, answers all questions appropriately, and able to speak in full sentences.    Assessment and Plan: Margeaux was seen today for urinary tract infection.  Diagnoses and all orders for this visit:  Acute cystitis with hematuria Urinalysis indicative of acute UTI with hematuria. Previous culture susceptible for bactrim, will treat with below. Increase water intake and avoid bladder irritants such as caffeine. Repeat urine in 2 weeks.  -     Urine Culture -     sulfamethoxazole-trimethoprim (BACTRIM DS) 800-160 MG tablet; Take 1 tablet by mouth 2 (two) times daily for 7 days.     Follow Up Instructions: Return in about 2 weeks (around 03/16/2019), or if symptoms worsen or fail to improve, for urine recheck.    I discussed the assessment and treatment plan with the patient. The patient was provided an opportunity to ask questions and all were answered. The patient agreed with the plan and demonstrated an understanding of the instructions.   The patient was advised to call back or seek an in-person evaluation if the symptoms worsen or if the condition fails to improve as anticipated.  The above assessment and management plan was discussed with the patient. The patient verbalized understanding of and has agreed to the management plan. Patient is aware to call the clinic if symptoms persist or worsen. Patient is aware when to return to the clinic for a follow-up visit. Patient educated on when it is appropriate to go to the emergency department.    I provided 15 minutes of non-face-to-face time during this encounter. The call  started at 1000. The call ended at 1015. The other time was used for coordination of care.    Monia Pouch, FNP-C [facility] [address] [place], [place] [address]  [phone] 03/02/19

## 2019-03-03 ENCOUNTER — Encounter: Payer: Self-pay | Admitting: Internal Medicine

## 2019-03-03 ENCOUNTER — Inpatient Hospital Stay: Payer: Medicare Other

## 2019-03-03 ENCOUNTER — Inpatient Hospital Stay: Payer: Medicare Other | Attending: Internal Medicine | Admitting: Internal Medicine

## 2019-03-03 ENCOUNTER — Other Ambulatory Visit: Payer: Self-pay

## 2019-03-03 VITALS — BP 137/45 | HR 60 | Temp 98.3°F | Resp 18 | Ht 64.0 in | Wt 222.3 lb

## 2019-03-03 DIAGNOSIS — D61818 Other pancytopenia: Secondary | ICD-10-CM | POA: Diagnosis not present

## 2019-03-03 DIAGNOSIS — I10 Essential (primary) hypertension: Secondary | ICD-10-CM | POA: Diagnosis not present

## 2019-03-03 DIAGNOSIS — D539 Nutritional anemia, unspecified: Secondary | ICD-10-CM

## 2019-03-03 DIAGNOSIS — Z7901 Long term (current) use of anticoagulants: Secondary | ICD-10-CM | POA: Insufficient documentation

## 2019-03-03 DIAGNOSIS — Z86711 Personal history of pulmonary embolism: Secondary | ICD-10-CM | POA: Insufficient documentation

## 2019-03-03 DIAGNOSIS — C7A8 Other malignant neuroendocrine tumors: Secondary | ICD-10-CM | POA: Insufficient documentation

## 2019-03-03 DIAGNOSIS — D696 Thrombocytopenia, unspecified: Secondary | ICD-10-CM

## 2019-03-03 DIAGNOSIS — C7A090 Malignant carcinoid tumor of the bronchus and lung: Secondary | ICD-10-CM | POA: Insufficient documentation

## 2019-03-03 DIAGNOSIS — C787 Secondary malignant neoplasm of liver and intrahepatic bile duct: Secondary | ICD-10-CM | POA: Insufficient documentation

## 2019-03-03 DIAGNOSIS — Z79899 Other long term (current) drug therapy: Secondary | ICD-10-CM | POA: Diagnosis not present

## 2019-03-03 DIAGNOSIS — C7B8 Other secondary neuroendocrine tumors: Secondary | ICD-10-CM | POA: Diagnosis not present

## 2019-03-03 DIAGNOSIS — C3492 Malignant neoplasm of unspecified part of left bronchus or lung: Secondary | ICD-10-CM | POA: Diagnosis not present

## 2019-03-03 LAB — CBC WITH DIFFERENTIAL (CANCER CENTER ONLY)
Abs Immature Granulocytes: 0.01 10*3/uL (ref 0.00–0.07)
Basophils Absolute: 0 10*3/uL (ref 0.0–0.1)
Basophils Relative: 1 %
Eosinophils Absolute: 0.2 10*3/uL (ref 0.0–0.5)
Eosinophils Relative: 6 %
HCT: 31.2 % — ABNORMAL LOW (ref 36.0–46.0)
Hemoglobin: 10.4 g/dL — ABNORMAL LOW (ref 12.0–15.0)
Immature Granulocytes: 0 %
Lymphocytes Relative: 18 %
Lymphs Abs: 0.5 10*3/uL — ABNORMAL LOW (ref 0.7–4.0)
MCH: 32.9 pg (ref 26.0–34.0)
MCHC: 33.3 g/dL (ref 30.0–36.0)
MCV: 98.7 fL (ref 80.0–100.0)
Monocytes Absolute: 0.5 10*3/uL (ref 0.1–1.0)
Monocytes Relative: 15 %
Neutro Abs: 1.9 10*3/uL (ref 1.7–7.7)
Neutrophils Relative %: 60 %
Platelet Count: 63 10*3/uL — ABNORMAL LOW (ref 150–400)
RBC: 3.16 MIL/uL — ABNORMAL LOW (ref 3.87–5.11)
RDW: 19.4 % — ABNORMAL HIGH (ref 11.5–15.5)
WBC Count: 3.1 10*3/uL — ABNORMAL LOW (ref 4.0–10.5)
nRBC: 0 % (ref 0.0–0.2)

## 2019-03-03 LAB — CMP (CANCER CENTER ONLY)
ALT: 11 U/L (ref 0–44)
AST: 17 U/L (ref 15–41)
Albumin: 3.5 g/dL (ref 3.5–5.0)
Alkaline Phosphatase: 99 U/L (ref 38–126)
Anion gap: 6 (ref 5–15)
BUN: 15 mg/dL (ref 8–23)
CO2: 25 mmol/L (ref 22–32)
Calcium: 9.3 mg/dL (ref 8.9–10.3)
Chloride: 102 mmol/L (ref 98–111)
Creatinine: 1.04 mg/dL — ABNORMAL HIGH (ref 0.44–1.00)
GFR, Est AFR Am: 58 mL/min — ABNORMAL LOW (ref >60)
GFR, Estimated: 50 mL/min — ABNORMAL LOW (ref >60)
Glucose, Bld: 98 mg/dL (ref 70–99)
Potassium: 4.3 mmol/L (ref 3.5–5.1)
Sodium: 133 mmol/L — ABNORMAL LOW (ref 135–145)
Total Bilirubin: 0.6 mg/dL (ref 0.3–1.2)
Total Protein: 6.2 g/dL — ABNORMAL LOW (ref 6.5–8.1)

## 2019-03-03 NOTE — Progress Notes (Signed)
[place] [phone]   Fax:([phone] [place], [place] [address]  DIAGNOSIS:  1) Metastatic intermediate. Neuroendocrine tumor of lung primary diagnosed in January 2018 and presented with small bilateral pulmonary nodules in addition to multiple liver metastasis. 2) right lower lobe pulmonary embolism diagnosed incidentally on CT scan of the chest on 04/23/2017  PRIOR THERAPY:  1) Status post radio embolization with Y 90 to the liver lesions by interventional radiology. 2) status post IVC filter placement by interventional radiology on 04/24/2017  CURRENT THERAPY: Xeloda 750 MG/M2 twice a day days 1-14 and Temodar 150 MG/M2 days 10-14 every 4 weeks. Status post 34 cycles. She started cycle #35 on February 28, 2019  INTERVAL HISTORY: [name] 85 y.o. female returns to the clinic today for follow-up visit.  The patient is feeling fine today with no concerning complaints except for fatigue as well as shortness of breath and cough at nighttime.  She denied having any current chest pain or hemoptysis.  She denied having any nausea, vomiting, diarrhea or constipation.  She started cycle #35 of her treatment few days ago.  She denied having any fever or chills.  She is here today for evaluation and repeat blood work.  MEDICAL HISTORY: Past Medical History:  Diagnosis Date  . Acute respiratory failure with hypoxia ([place]) 10/23/2015  . Allergic rhinitis    PT. DENIES  . Anxiety   . Aortic insufficiency    Echo 04/29/2018: EF 65-70, mild AS (mean 13), mod AI, Asc Aorta 42 mm (mildly dilated), mild LAE, PASP 41, pericardium normal in appearance.   . Arthritis    NECK  . Ataxia   . Back pain 12/13/2016  . Bradycardia    primarily nocturnal  . Burning tongue syndrome 25 years  . Cancer ([place]) dx'd 06/2016   liver  . Cataract   . Cerebellar degeneration ([place])   . Chest pain 12/13/2016   Atypical chest  pain  . Chronic urinary tract infection   . Complication of anesthesia    low o2 sats, coded 30 years ago  . CVA (cerebral infarction) 05/2003  . Dehydration with hyponatremia 03/23/2018  . Depression   . Encounter for antineoplastic chemotherapy 10/09/2016  . Fatigue 01/03/2015  . Gait disorder   . Gastric polyps   . GERD (gastroesophageal reflux disease)   . Goals of care, counseling/discussion 10/09/2016  . H/O: CVA (cerebrovascular accident) 07/14/2016  . High cholesterol   . History of pericarditis   . Hyperlipidemia   . Hypotension   . Hypothyroidism   . IBS (irritable bowel syndrome)   . Neuroendocrine cancer ([place]) 08/23/2016  . Obesity   . Paroxysmal atrial fibrillation ([name])    chads2vasc score is 6,  she is felt to be a poor candidate for anticoagulation  . Pericarditis   . Personal history of arterial venous malformation (AVM)    right side of face  . Seizure disorder ([place])   . Seizures ([place]) 2003   " smelling"- Gabapentin "no problem"  . Shortness of breath dyspnea    with exertion  . Sick sinus syndrome ([place])   . Sternum fx 10/27/2013  . Stroke [facility]) 5 years ago   Right side of face weak, slurred speach-   . Thyroid disease   . TIA (transient ischemic attack)   . UTI (lower urinary tract infection) 03/27/2016   "frequently"    ALLERGIES:  is allergic to levaquin [levofloxacin] and lisinopril.  MEDICATIONS:  Current Outpatient Medications  Medication Sig Dispense Refill  . amitriptyline (ELAVIL) 25 MG tablet Take 1 tablet (25 mg total) by mouth at bedtime. 90 tablet 1  . amLODipine (NORVASC) 2.5 MG tablet Take 1 tablet (2.5 mg total) by mouth daily. 90 tablet 1  . capecitabine (XELODA) 500 MG tablet TAKE 3 TABLETS (1500MG ) BY MOUTH TWICE DAILY AFTER A MEAL. TAKE ON DAYS 1-14 OF EACH 28 DAY CYCLE 84 tablet 2  . escitalopram (LEXAPRO) 20 MG tablet Take 1 tablet (20 mg total) by mouth at bedtime. 90 tablet 1  . fluticasone (FLONASE) 50 MCG/ACT nasal spray Place 2  sprays into both nostrils at bedtime. 48 g 3  . furosemide (LASIX) 20 MG tablet TAKE 1 TABLET DAILY AS NEEDED FOR FLUID RETENTION 90 tablet 1  . gabapentin (NEURONTIN) 400 MG capsule TAKE 1 TO 2 CAPSULES THREE TIMES A DAY 720 capsule 1  . hydroxypropyl methylcellulose / hypromellose (ISOPTO TEARS / GONIOVISC) 2.5 % ophthalmic solution Place 1 drop into both eyes daily.     Marland Kitchen levothyroxine (SYNTHROID, LEVOTHROID) 75 MCG tablet Take 1 tablet (75 mcg total) by mouth daily before breakfast. 90 tablet 1  . lovastatin (MEVACOR) 40 MG tablet Take 1 tablet (40 mg total) by mouth at bedtime. 90 tablet 1  . Melatonin 3 MG TBDP Take 3-6 mg by mouth at bedtime as needed. 60 tablet 1  . omeprazole (PRILOSEC) 40 MG capsule Take 1 capsule (40 mg total) by mouth daily. 90 capsule 1  . ondansetron (ZOFRAN) 8 MG tablet Take 1 tablet (8 mg total) by mouth every 8 (eight) hours as needed for nausea or vomiting. 90 tablet 1  . OXcarbazepine (TRILEPTAL) 150 MG tablet Take 1 tablet (150 mg total) by mouth 2 (two) times daily. 180 tablet 3  . sulfamethoxazole-trimethoprim (BACTRIM DS) 800-160 MG tablet Take 1 tablet by mouth 2 (two) times daily for 7 days. 14 tablet 0  . temozolomide (TEMODAR) 140 MG capsule TAKE 2 CAPSULES (280MG ) BY MOUTH DAILY FOR 5 DAYS ON DAYS 10-14 OF EACH 28D CYCLE. MAY TAKE ON EMPTY STOMACH AT BEDTIME TO DECREASE NAUSEA & 10 capsule 2  . XARELTO 20 MG TABS tablet TAKE 1 TABLET DAILY WITH SUPPER 90 tablet 4   No current facility-administered medications for this visit.     SURGICAL HISTORY:  Past Surgical History:  Procedure Laterality Date  . APPENDECTOMY  85 years old  . COLONOSCOPY  2006, 2009  . COLONOSCOPY WITH PROPOFOL N/A 03/28/2016   Procedure: COLONOSCOPY WITH PROPOFOL;  Surgeon: Gatha Mayer, MD;  Location: [facility];  Service: Endoscopy;  Laterality: N/A;  . CORONARY ANGIOGRAPHY N/A 05/22/2018   Procedure: CORONARY ANGIOGRAPHY (CATH LAB);  Surgeon: Lorretta Harp, MD;   Location: [facility] CV LAB;  Service: Cardiovascular;  Laterality: N/A;  . cyst removed  35 years ago  . EP IMPLANTABLE DEVICE N/A 01/06/2015   Procedure: Loop Recorder Insertion;  Surgeon: Thompson Grayer, MD;  Location: [facility] CV LAB;  Service: Cardiovascular;  Laterality: N/A;  . EYE SURGERY Right    Cataract  . IR ANGIOGRAM SELECTIVE EACH ADDITIONAL VESSEL  11/28/2016  . IR ANGIOGRAM SELECTIVE EACH ADDITIONAL VESSEL  11/28/2016  . IR ANGIOGRAM SELECTIVE EACH ADDITIONAL VESSEL  11/28/2016  . IR ANGIOGRAM SELECTIVE EACH ADDITIONAL VESSEL  11/28/2016  . IR ANGIOGRAM SELECTIVE EACH ADDITIONAL VESSEL  12/13/2016  . IR ANGIOGRAM SELECTIVE EACH ADDITIONAL VESSEL  12/13/2016  .  IR ANGIOGRAM SELECTIVE EACH ADDITIONAL VESSEL  01/09/2017  . IR ANGIOGRAM VISCERAL SELECTIVE  11/28/2016  . IR ANGIOGRAM VISCERAL SELECTIVE  11/28/2016  . IR ANGIOGRAM VISCERAL SELECTIVE  12/13/2016  . IR ANGIOGRAM VISCERAL SELECTIVE  12/13/2016  . IR ANGIOGRAM VISCERAL SELECTIVE  01/09/2017  . IR EMBO ARTERIAL NOT HEMORR HEMANG INC GUIDE ROADMAPPING  11/28/2016  . IR EMBO TUMOR ORGAN ISCHEMIA INFARCT INC GUIDE ROADMAPPING  12/13/2016  . IR EMBO TUMOR ORGAN ISCHEMIA INFARCT INC GUIDE ROADMAPPING  01/09/2017  . IR IVC FILTER PLMT / S&I /IMG GUID/MOD SED  04/24/2017  . IR RADIOLOGIST EVAL & MGMT  11/06/2016  . IR RADIOLOGIST EVAL & MGMT  01/02/2017  . IR RADIOLOGIST EVAL & MGMT  02/05/2017  . IR RADIOLOGIST EVAL & MGMT  05/02/2017  . IR RADIOLOGIST EVAL & MGMT  10/17/2017  . IR US GUIDE VASC ACCESS RIGHT  11/28/2016  . IR US GUIDE VASC ACCESS RIGHT  12/13/2016  . IR US GUIDE VASC ACCESS RIGHT  01/09/2017  . KNEE ARTHROSCOPY Right 11/14/2006  . KNEE ARTHROSCOPY Bilateral 5 and 6 years ago  . KNEE ARTHROSCOPY WITH LATERAL MENISECTOMY  07/03/2012   Procedure: KNEE ARTHROSCOPY WITH LATERAL MENISECTOMY;  Surgeon: Magnus Sinning, MD;  Location: [facility];  Service: Orthopedics;  Laterality: Left;  with Partial Lateral Menisectomy and Medial Menisectomy.  Shaving of medial and lateral femoral condyles. Shaving of patella. Removal of a loose body  . LOOP RECORDER REMOVAL N/A 05/22/2018   Procedure: LOOP RECORDER REMOVAL;  Surgeon: Thompson Grayer, MD;  Location: [facility] CV LAB;  Service: Cardiovascular;  Laterality: N/A;  . PACEMAKER IMPLANT N/A 05/22/2018   MDT Azure XT DR MRI implanted by Dr Rayann Heman for sick sinus syndrome  . tibial and fibular internal fixation Left   . TOTAL ABDOMINAL HYSTERECTOMY  85 years old  . UPPER GASTROINTESTINAL ENDOSCOPY  2009, 2013    REVIEW OF SYSTEMS:  A comprehensive review of systems was negative except for: Constitutional: positive for fatigue Respiratory: positive for cough and dyspnea on exertion Musculoskeletal: positive for muscle weakness   PHYSICAL EXAMINATION: General appearance: alert, cooperative, fatigued and no distress Head: Normocephalic, without obvious abnormality, atraumatic Neck: no adenopathy, no JVD, supple, symmetrical, trachea midline and thyroid not enlarged, symmetric, no tenderness/mass/nodules Lymph nodes: Cervical, supraclavicular, and axillary nodes normal. Resp: clear to auscultation bilaterally Back: symmetric, no curvature. ROM normal. No CVA tenderness. Cardio: regular rate and rhythm, S1, S2 normal, no murmur, click, rub or gallop GI: soft, non-tender; bowel sounds normal; no masses,  no organomegaly Extremities: extremities normal, atraumatic, no cyanosis or edema  ECOG PERFORMANCE STATUS: 1 - Symptomatic but completely ambulatory  Blood pressure (!) 137/45, pulse 60, temperature 98.3 F (36.8 C), temperature source Oral, resp. rate 18, height 5\' 4"  (1.626 m), weight 222 lb 4.8 oz (100.8 kg), SpO2 100 %.  LABORATORY DATA: Lab Results  Component Value Date   WBC 3.1 (L) 03/03/2019   HGB 10.4 (L) 03/03/2019   HCT 31.2 (L) 03/03/2019   MCV 98.7 03/03/2019   PLT 63 (L) 03/03/2019      Chemistry      Component Value Date/Time   NA 133 (L) 03/03/2019 1039   NA  140 10/10/2018 1548   NA 135 (L) 07/10/2017 1142   K 4.3 03/03/2019 1039   K 4.6 07/10/2017 1142   CL 102 03/03/2019 1039   CO2 25 03/03/2019 1039   CO2 25 07/10/2017 1142   BUN 15 03/03/2019 1039  BUN 14 10/10/2018 1548   BUN 11.4 07/10/2017 1142   CREATININE 1.04 (H) 03/03/2019 1039   CREATININE 1.0 07/10/2017 1142      Component Value Date/Time   CALCIUM 9.3 03/03/2019 1039   CALCIUM 9.9 07/10/2017 1142   ALKPHOS 99 03/03/2019 1039   ALKPHOS 93 07/10/2017 1142   AST 17 03/03/2019 1039   AST 43 (H) 07/10/2017 1142   ALT 11 03/03/2019 1039   ALT 28 07/10/2017 1142   BILITOT 0.6 03/03/2019 1039   BILITOT 0.56 07/10/2017 1142       RADIOGRAPHIC STUDIES: No results found.  ASSESSMENT AND PLAN:  This is a very pleasant 85 years old white female with metastatic intermediate grade neuroendocrine carcinoma of questionable lung primary and multiple metastatic liver lesions and pancreatic lesions.She status post treatment with radio embolization with Y90 to the left and right lobe liver lesions.Status post treatment with Y 90 to the left and right lobes of the liver. She is currently undergoing systemic chemotherapy with Xeloda and Temodar status post 34 cycles. The patient continues to tolerate her treatment well with no concerning adverse effects. She started cycle #35 on February 28, 2019. I recommended for the patient to continue her treatment as planned. I will see her back for follow-up visit in 6 weeks for evaluation with repeat CT scan of the chest, abdomen and pelvis for restaging of her disease. Her pancytopenia today is secondary to the current treatment with chemotherapy this week. The patient was advised to call immediately if she has any concerning symptoms in the interval. The patient voices understanding of current disease status and treatment options and is in agreement with the current care plan. All questions were answered. The patient knows to call the clinic with  any problems, questions or concerns. We can certainly see the patient much sooner if necessary.  Disclaimer: This note was dictated with voice recognition software. Similar sounding words can inadvertently be transcribed and may not be corrected upon review.

## 2019-03-04 LAB — URINE CULTURE

## 2019-03-04 MED FILL — CAPECITABINE 500 MG TABS: 500 | 14 days supply | Qty: 84 | Fill #2

## 2019-03-04 MED FILL — TEMOZOLOMIDE 140 MG CAPS: 140 | 28 days supply | Qty: 10 | Fill #2

## 2019-03-04 NOTE — Progress Notes (Signed)
NEUROLOGY FOLLOW UP OFFICE NOTE  [name] [id number]  HISTORY OF PRESENT ILLNESS: [name] is an 85 year old right-handed Caucasian woman with cerebellar degeneration, metastatic intermediate grade neuroendocrine carcinoma, hypertension, paroxysmal atrial fibrillation, IBS, hypothyroidism and history of TIA and simple partial seizures who follows up for left-sided trigeminal neuralgia.  She is accompanied by her husband who supplements history.  UPDATE: Current medication: Oxcarbazepine 150 mg twice daily; gabapentin 800 mg/400 mg / 800 mg for seizure prophylaxis.  Amitriptyline 25 mg at bedtime for burning tongue syndrome.  Fatigued.  Doesn't talk much anymore.  Trouble with memory.  Starting another round of oral chemo next week for 2 weeks.    A couple of weeks ago, she started feeling the facial pain again but once in a while and not severe.  Still manageable.  Occasional aura (phantosmia) but no seizure activity.    03/03/19 LABS:  CMP with Na 133, K 4.3, Cl 102, CO2 25, glucose 98, BUN 15, Cr 1.04, t bili 0.6, ALP 99, AST 17, ALT 11; CBC with WBC 3.1, HGB 10.4, HCT 31.2, MCV 98.7, PLT 63  HISTORY:  She reports history of an AVM on the right side of her face, diagnosed many years ago and therefore cannot use blood thinners.  However, prior MRA of the head and neck did not reveal any AVM.  She also reports pain from the right side of her jaw radiating up to the right temple since 2017.  In 2018, she developed pain around her left eye which would hurt whenever she moved her eye.  It eventually resolved.  She then developed a severe aching pain at her left TMJ which radiates up into her temple which occur daily, lasting a few seconds and occurring intermittent throughout the day.  It may occur spontaneously but also is aggravated by exertion but not talking, chewing or brushing her teeth.  Repeat CT of head with and without contrast from 07/18/18 was personally reviewed and  demonstrated known meningioma along right tentorium with interval growth compared to prior imaging from December 2017 (11 x 14 x 20 mm compared to 7 x 9 x 15 mm in 2017).  Sed rate from 06/26/2017 was 34.  PAST MEDICAL HISTORY: Past Medical History:  Diagnosis Date  . Acute respiratory failure with hypoxia ([place]) 10/23/2015  . Allergic rhinitis    PT. DENIES  . Anxiety   . Aortic insufficiency    Echo 04/29/2018: EF 65-70, mild AS (mean 13), mod AI, Asc Aorta 42 mm (mildly dilated), mild LAE, PASP 41, pericardium normal in appearance.   . Arthritis    NECK  . Ataxia   . Back pain 12/13/2016  . Bradycardia    primarily nocturnal  . Burning tongue syndrome 25 years  . Cancer ([place]) dx'd 06/2016   liver  . Cataract   . Cerebellar degeneration ([place])   . Chest pain 12/13/2016   Atypical chest pain  . Chronic urinary tract infection   . Complication of anesthesia    low o2 sats, coded 30 years ago  . CVA (cerebral infarction) 05/2003  . Dehydration with hyponatremia 03/23/2018  . Depression   . Encounter for antineoplastic chemotherapy 10/09/2016  . Fatigue 01/03/2015  . Gait disorder   . Gastric polyps   . GERD (gastroesophageal reflux disease)   . Goals of care, counseling/discussion 10/09/2016  . H/O: CVA (cerebrovascular accident) 07/14/2016  . High cholesterol   . History of pericarditis   . Hyperlipidemia   .  Hypotension   . Hypothyroidism   . IBS (irritable bowel syndrome)   . Neuroendocrine cancer ([place]) 08/23/2016  . Obesity   . Paroxysmal atrial fibrillation ([name])    chads2vasc score is 6,  she is felt to be a poor candidate for anticoagulation  . Pericarditis   . Personal history of arterial venous malformation (AVM)    right side of face  . Seizure disorder ([place])   . Seizures ([place]) 2003   " smelling"- Gabapentin "no problem"  . Shortness of breath dyspnea    with exertion  . Sick sinus syndrome ([place])   . Sternum fx 10/27/2013  . Stroke [facility]) 5 years ago   Right  side of face weak, slurred speach-   . Thyroid disease   . TIA (transient ischemic attack)   . UTI (lower urinary tract infection) 03/27/2016   "frequently"    MEDICATIONS: Current Outpatient Medications on File Prior to Visit  Medication Sig Dispense Refill  . amitriptyline (ELAVIL) 25 MG tablet Take 1 tablet (25 mg total) by mouth at bedtime. 90 tablet 1  . amLODipine (NORVASC) 2.5 MG tablet Take 1 tablet (2.5 mg total) by mouth daily. 90 tablet 1  . capecitabine (XELODA) 500 MG tablet TAKE 3 TABLETS (1500MG ) BY MOUTH TWICE DAILY AFTER A MEAL. TAKE ON DAYS 1-14 OF EACH 28 DAY CYCLE 84 tablet 2  . escitalopram (LEXAPRO) 20 MG tablet Take 1 tablet (20 mg total) by mouth at bedtime. 90 tablet 1  . fluticasone (FLONASE) 50 MCG/ACT nasal spray Place 2 sprays into both nostrils at bedtime. 48 g 3  . furosemide (LASIX) 20 MG tablet TAKE 1 TABLET DAILY AS NEEDED FOR FLUID RETENTION 90 tablet 1  . gabapentin (NEURONTIN) 400 MG capsule TAKE 1 TO 2 CAPSULES THREE TIMES A DAY 720 capsule 1  . hydroxypropyl methylcellulose / hypromellose (ISOPTO TEARS / GONIOVISC) 2.5 % ophthalmic solution Place 1 drop into both eyes daily.     Marland Kitchen levothyroxine (SYNTHROID, LEVOTHROID) 75 MCG tablet Take 1 tablet (75 mcg total) by mouth daily before breakfast. 90 tablet 1  . lovastatin (MEVACOR) 40 MG tablet Take 1 tablet (40 mg total) by mouth at bedtime. 90 tablet 1  . Melatonin 3 MG TBDP Take 3-6 mg by mouth at bedtime as needed. 60 tablet 1  . omeprazole (PRILOSEC) 40 MG capsule Take 1 capsule (40 mg total) by mouth daily. 90 capsule 1  . ondansetron (ZOFRAN) 8 MG tablet Take 1 tablet (8 mg total) by mouth every 8 (eight) hours as needed for nausea or vomiting. 90 tablet 1  . OXcarbazepine (TRILEPTAL) 150 MG tablet Take 1 tablet (150 mg total) by mouth 2 (two) times daily. 180 tablet 3  . sulfamethoxazole-trimethoprim (BACTRIM DS) 800-160 MG tablet Take 1 tablet by mouth 2 (two) times daily for 7 days. 14 tablet 0  .  temozolomide (TEMODAR) 140 MG capsule TAKE 2 CAPSULES (280MG ) BY MOUTH DAILY FOR 5 DAYS ON DAYS 10-14 OF EACH 28D CYCLE. MAY TAKE ON EMPTY STOMACH AT BEDTIME TO DECREASE NAUSEA & 10 capsule 2  . XARELTO 20 MG TABS tablet TAKE 1 TABLET DAILY WITH SUPPER 90 tablet 4   No current facility-administered medications on file prior to visit.     ALLERGIES: Allergies  Allergen Reactions  . Levaquin [Levofloxacin] Shortness Of Breath  . Lisinopril Cough    FAMILY HISTORY: Family History  Problem Relation Age of Onset  . Heart attack Father 53       fatal  .  Coronary artery disease Brother   . Diabetes Brother   . Prostate cancer Brother   . Prostate cancer Son      SOCIAL HISTORY: Social History   Socioeconomic History  . Marital status: Married    Spouse name: [name]  . Number of children: 6  . Years of education: 18  . Highest education level: 10th grade  Occupational History  . Occupation: Agricultural engineer  Social Needs  . Financial resource strain: Not very hard  . Food insecurity    Worry: Never true    Inability: Never true  . Transportation needs    Medical: No    Non-medical: No  Tobacco Use  . Smoking status: Former Smoker    Packs/day: 0.25    Years: 10.00    Pack years: 2.50    Types: Cigarettes    Quit date: 07/31/1975    Years since quitting: 43.6  . Smokeless tobacco: Never Used  Substance and Sexual Activity  . Alcohol use: No    Comment: Rare- maybe a drink ever 2 years  . Drug use: No  . Sexual activity: Not Currently  Lifestyle  . Physical activity    Days per week: 0 days    Minutes per session: 0 min  . Stress: Not at all  Relationships  . Social connections    Talks on phone: More than three times a week    Gets together: More than three times a week    Attends religious service: Never    Active member of club or organization: No    Attends meetings of clubs or organizations: Never    Relationship status: Married  . Intimate partner  violence    Fear of current or ex partner: No    Emotionally abused: No    Physically abused: No    Forced sexual activity: No  Other Topics Concern  . Not on file  Social History Narrative   Lives with husband, does have stairs, does not use them. Pt completed 10th grade.    REVIEW OF SYSTEMS: Constitutional: fatigued Eyes: No visual changes, double vision, eye pain Ear, nose and throat: No hearing loss, ear pain, nasal congestion, sore throat Cardiovascular: No chest pain, palpitations Respiratory:  No shortness of breath at rest or with exertion, wheezes GastrointestinaI: No nausea, vomiting, diarrhea, abdominal pain, fecal incontinence Genitourinary:  No dysuria, urinary retention or frequency Musculoskeletal:  No neck pain, back pain Integumentary: No rash, pruritus, skin lesions Neurological: as above Psychiatric: depression Endocrine: fatigued Hematologic/Lymphatic:  No purpura, petechiae. Allergic/Immunologic: no itchy/runny eyes, nasal congestion, recent allergic reactions, rashes  PHYSICAL EXAM: Blood pressure 136/75, pulse 60, temperature 98.5 F (36.9 C), height 5\' 4"  (1.626 m), weight 222 lb (100.7 kg), SpO2 99 %. General: No acute distress.  Patient appears well-groomed.   Head:  Normocephalic/atraumatic Eyes:  Fundi examined but not visualized Neck: supple, no paraspinal tenderness, full range of motion Heart:  Regular rate and rhythm Lungs:  Clear to auscultation bilaterally Back: No paraspinal tenderness Neurological Exam: alert and oriented to person, place, and time. Attention span and concentration intact, recent and remote memory intact, fund of knowledge intact.  Speech fluent and not dysarthric, language intact.  CN II-XII intact. Bulk and tone normal, muscle strength 5-/5 upper extremities, 4+/5 lower extremities.  Sensation to light touchintact.  Deep tendon reflexes 1+ throughout.  Finger to nose  testing intact.  Wheelchair bound  IMPRESSION: 1.   Left-sided trigeminal neuralgia. Overall stable.  Has noted occasional  mild pain over the past 2 weeks but manageable.  Does not want to increase oxcarbazepine. 2.  Cerebral meningioma 3.  Symptomatic partial epilepsy, stable 4.  Burning tongue syndrome, stable  PLAN: 1.  Oxcarbazepine 150mg  twice daily for trigeminal neuralgia 2.  Gabapentin 800mg /400mg /800mg  for seizure prophylaxis 3.  Amitriptyline 25mg  for burning tongue syndrome 4.  Repeat CT head with and without contrast in 6 months to follow up on meningioma 5.  Follow up after repeat head CT  25 minutes spent face to face with patient, over 50% spent discussing management.  Metta Clines, DO

## 2019-03-05 ENCOUNTER — Ambulatory Visit (INDEPENDENT_AMBULATORY_CARE_PROVIDER_SITE_OTHER): Payer: Medicare Other | Admitting: Neurology

## 2019-03-05 ENCOUNTER — Encounter: Payer: Self-pay | Admitting: Neurology

## 2019-03-05 ENCOUNTER — Other Ambulatory Visit: Payer: Self-pay

## 2019-03-05 VITALS — BP 136/75 | HR 60 | Temp 98.5°F | Ht 64.0 in | Wt 222.0 lb

## 2019-03-05 DIAGNOSIS — K146 Glossodynia: Secondary | ICD-10-CM

## 2019-03-05 DIAGNOSIS — D320 Benign neoplasm of cerebral meninges: Secondary | ICD-10-CM | POA: Diagnosis not present

## 2019-03-05 DIAGNOSIS — G40109 Localization-related (focal) (partial) symptomatic epilepsy and epileptic syndromes with simple partial seizures, not intractable, without status epilepticus: Secondary | ICD-10-CM

## 2019-03-05 DIAGNOSIS — G500 Trigeminal neuralgia: Secondary | ICD-10-CM

## 2019-03-05 NOTE — Progress Notes (Signed)
Remote pacemaker transmission.   

## 2019-03-05 NOTE — Patient Instructions (Addendum)
CT head with and without contrast in 6 months. Follow up afterward  We have sent a referral to [place] for your CT to be scheduled in February 2021.They will call you directly to schedule your appointment. They are located at [place]. If you need to contact them directly please call [phone].

## 2019-03-16 ENCOUNTER — Telehealth: Payer: Self-pay | Admitting: Cardiovascular Disease

## 2019-03-16 NOTE — Telephone Encounter (Signed)
  Husband is calling to see if [name]'s appt on 03/18/19 @ 2:30pm with Dr Gwenlyn Found can be changed to a telephone visit. He states the patient is not feeling well due to cancer and he does not think he can bring her into the office. Please let patient's spouse know if this is possible.

## 2019-03-16 NOTE — Telephone Encounter (Signed)
Pt husband calling to request if pt's appointment scheduled on 8/19 can be changed to a virtual appointment. Pt has cancer and hasn't been feeling well.  Will route to MD's nurse

## 2019-03-16 NOTE — Telephone Encounter (Signed)
Spoke with pt husband. Appointment changed to virtual on 9/29 at 11:45am. Pt husband verbalized understanding

## 2019-03-18 ENCOUNTER — Ambulatory Visit: Payer: Medicare Other | Admitting: Cardiovascular Disease

## 2019-03-25 ENCOUNTER — Other Ambulatory Visit: Payer: Self-pay | Admitting: Physician Assistant

## 2019-03-25 DIAGNOSIS — C7A8 Other malignant neuroendocrine tumors: Secondary | ICD-10-CM

## 2019-03-25 DIAGNOSIS — Z5111 Encounter for antineoplastic chemotherapy: Secondary | ICD-10-CM

## 2019-03-26 ENCOUNTER — Other Ambulatory Visit: Payer: Self-pay | Admitting: Family Medicine

## 2019-03-31 MED FILL — TEMOZOLOMIDE 140 MG CAPS: 140 | 28 days supply | Qty: 10 | Fill #0

## 2019-03-31 MED FILL — CAPECITABINE 500 MG TABS: 500 | 14 days supply | Qty: 84 | Fill #0

## 2019-04-08 ENCOUNTER — Other Ambulatory Visit: Payer: Self-pay | Admitting: Family Medicine

## 2019-04-08 DIAGNOSIS — F329 Major depressive disorder, single episode, unspecified: Secondary | ICD-10-CM

## 2019-04-08 DIAGNOSIS — F32A Depression, unspecified: Secondary | ICD-10-CM

## 2019-04-08 DIAGNOSIS — F411 Generalized anxiety disorder: Secondary | ICD-10-CM

## 2019-04-09 ENCOUNTER — Other Ambulatory Visit: Payer: Self-pay

## 2019-04-09 ENCOUNTER — Ambulatory Visit (INDEPENDENT_AMBULATORY_CARE_PROVIDER_SITE_OTHER): Payer: Medicare Other | Admitting: Physician Assistant

## 2019-04-09 DIAGNOSIS — N3001 Acute cystitis with hematuria: Secondary | ICD-10-CM | POA: Diagnosis not present

## 2019-04-09 MED ORDER — CEPHALEXIN 250 MG PO CAPS: 250.0000 mg | ORAL_CAPSULE | Freq: Three times a day (TID) | ORAL | 0 refills | Status: DC

## 2019-04-10 ENCOUNTER — Other Ambulatory Visit: Payer: Self-pay

## 2019-04-10 ENCOUNTER — Inpatient Hospital Stay: Payer: Medicare Other | Attending: Internal Medicine

## 2019-04-10 ENCOUNTER — Ambulatory Visit (HOSPITAL_COMMUNITY)
Admission: RE | Admit: 2019-04-10 | Discharge: 2019-04-10 | Disposition: A | Discharge status: Home / Self Care | Payer: Medicare Other | Source: Ambulatory Visit | Attending: Internal Medicine | Admitting: Internal Medicine

## 2019-04-10 ENCOUNTER — Encounter (HOSPITAL_COMMUNITY): Payer: Self-pay

## 2019-04-10 DIAGNOSIS — C787 Secondary malignant neoplasm of liver and intrahepatic bile duct: Secondary | ICD-10-CM | POA: Insufficient documentation

## 2019-04-10 DIAGNOSIS — C7B8 Other secondary neuroendocrine tumors: Secondary | ICD-10-CM

## 2019-04-10 DIAGNOSIS — C7A8 Other malignant neuroendocrine tumors: Secondary | ICD-10-CM | POA: Insufficient documentation

## 2019-04-10 DIAGNOSIS — C7A090 Malignant carcinoid tumor of the bronchus and lung: Secondary | ICD-10-CM | POA: Diagnosis not present

## 2019-04-10 DIAGNOSIS — Z7901 Long term (current) use of anticoagulants: Secondary | ICD-10-CM | POA: Insufficient documentation

## 2019-04-10 DIAGNOSIS — K8020 Calculus of gallbladder without cholecystitis without obstruction: Secondary | ICD-10-CM | POA: Diagnosis not present

## 2019-04-10 DIAGNOSIS — D61818 Other pancytopenia: Secondary | ICD-10-CM | POA: Insufficient documentation

## 2019-04-10 DIAGNOSIS — I2510 Atherosclerotic heart disease of native coronary artery without angina pectoris: Secondary | ICD-10-CM | POA: Diagnosis not present

## 2019-04-10 DIAGNOSIS — Z950 Presence of cardiac pacemaker: Secondary | ICD-10-CM | POA: Diagnosis not present

## 2019-04-10 DIAGNOSIS — Z86711 Personal history of pulmonary embolism: Secondary | ICD-10-CM | POA: Diagnosis not present

## 2019-04-10 DIAGNOSIS — M898X8 Other specified disorders of bone, other site: Secondary | ICD-10-CM | POA: Diagnosis not present

## 2019-04-10 DIAGNOSIS — N289 Disorder of kidney and ureter, unspecified: Secondary | ICD-10-CM | POA: Diagnosis not present

## 2019-04-10 DIAGNOSIS — Z79899 Other long term (current) drug therapy: Secondary | ICD-10-CM | POA: Diagnosis not present

## 2019-04-10 DIAGNOSIS — K769 Liver disease, unspecified: Secondary | ICD-10-CM | POA: Diagnosis not present

## 2019-04-10 DIAGNOSIS — I700 Atherosclerosis of aorta: Secondary | ICD-10-CM | POA: Diagnosis not present

## 2019-04-10 DIAGNOSIS — R918 Other nonspecific abnormal finding of lung field: Secondary | ICD-10-CM | POA: Diagnosis not present

## 2019-04-10 LAB — CBC WITH DIFFERENTIAL (CANCER CENTER ONLY)
Abs Immature Granulocytes: 0.03 10*3/uL (ref 0.00–0.07)
Basophils Absolute: 0 10*3/uL (ref 0.0–0.1)
Basophils Relative: 1 %
Eosinophils Absolute: 0.1 10*3/uL (ref 0.0–0.5)
Eosinophils Relative: 3 %
HCT: 34.2 % — ABNORMAL LOW (ref 36.0–46.0)
Hemoglobin: 11.2 g/dL — ABNORMAL LOW (ref 12.0–15.0)
Immature Granulocytes: 1 %
Lymphocytes Relative: 18 %
Lymphs Abs: 0.7 10*3/uL (ref 0.7–4.0)
MCH: 32.7 pg (ref 26.0–34.0)
MCHC: 32.7 g/dL (ref 30.0–36.0)
MCV: 100 fL (ref 80.0–100.0)
Monocytes Absolute: 0.5 10*3/uL (ref 0.1–1.0)
Monocytes Relative: 14 %
Neutro Abs: 2.4 10*3/uL (ref 1.7–7.7)
Neutrophils Relative %: 63 %
Platelet Count: 109 10*3/uL — ABNORMAL LOW (ref 150–400)
RBC: 3.42 MIL/uL — ABNORMAL LOW (ref 3.87–5.11)
RDW: 18.3 % — ABNORMAL HIGH (ref 11.5–15.5)
WBC Count: 3.8 10*3/uL — ABNORMAL LOW (ref 4.0–10.5)
nRBC: 0 % (ref 0.0–0.2)

## 2019-04-10 LAB — CMP (CANCER CENTER ONLY)
ALT: 11 U/L (ref 0–44)
AST: 23 U/L (ref 15–41)
Albumin: 3.9 g/dL (ref 3.5–5.0)
Alkaline Phosphatase: 106 U/L (ref 38–126)
Anion gap: 7 (ref 5–15)
BUN: 10 mg/dL (ref 8–23)
CO2: 27 mmol/L (ref 22–32)
Calcium: 9.8 mg/dL (ref 8.9–10.3)
Chloride: 99 mmol/L (ref 98–111)
Creatinine: 0.89 mg/dL (ref 0.44–1.00)
GFR, Est AFR Am: >60 mL/min (ref >60)
GFR, Estimated: 60 mL/min — ABNORMAL LOW (ref >60)
Glucose, Bld: 108 mg/dL — ABNORMAL HIGH (ref 70–99)
Potassium: 5.1 mmol/L (ref 3.5–5.1)
Sodium: 133 mmol/L — ABNORMAL LOW (ref 135–145)
Total Bilirubin: 0.5 mg/dL (ref 0.3–1.2)
Total Protein: 6.7 g/dL (ref 6.5–8.1)

## 2019-04-10 MED ORDER — IOHEXOL 300 MG/ML  SOLN
100.0000 mL | Freq: Once | INTRAMUSCULAR | Status: AC | PRN
  Administered 2019-04-10: 100 mL via INTRAVENOUS

## 2019-04-10 MED ORDER — SODIUM CHLORIDE (PF) 0.9 % IJ SOLN
INTRAMUSCULAR | Status: AC
  Filled 2019-04-10: qty 50

## 2019-04-12 ENCOUNTER — Encounter: Payer: Self-pay | Admitting: Physician Assistant

## 2019-04-12 NOTE — Progress Notes (Signed)
Telephone visit  Subjective: CC:UTI PCP: [name], MD NWG:NFAOZHY CAROLANN BRAZELL is a 85 y.o. female calls for telephone consult today. Patient provides verbal consent for consult held via phone.  Patient is identified with 2 separate identifiers.  At this time the entire area is on COVID-19 social distancing and stay home orders are in place.  Patient is of higher risk and therefore we are performing this by a virtual method.  Location of patient: home Location of provider: [facility] Others present for call: no  This patient has had several days of dysuria, frequency and nocturia. There is also pain over the bladder in the suprapubic region, no back pain. Denies leakage or hematuria.  Denies fever or chills. No pain in flank area.  In reviewing the patient's chart she does have frequent urinary tract infections.  Most recently she had been placed on Keflex.  So working to try this again.  She is to let us know if things do not improve.   ROS: Per HPI  Allergies  Allergen Reactions   Levaquin [Levofloxacin] Shortness Of Breath   Lisinopril Cough   Past Medical History:  Diagnosis Date   Acute respiratory failure with hypoxia ([name]) 10/23/2015   Allergic rhinitis    PT. DENIES   Anxiety    Aortic insufficiency    Echo 04/29/2018: EF 65-70, mild AS (mean 13), mod AI, Asc Aorta 42 mm (mildly dilated), mild LAE, PASP 41, pericardium normal in appearance.    Arthritis    NECK   Ataxia    Back pain 12/13/2016   Bradycardia    primarily nocturnal   Burning tongue syndrome 25 years   Cancer ([place]) dx'd 06/2016   liver   Cataract    Cerebellar degeneration ([place])    Chest pain 12/13/2016   Atypical chest pain   Chronic urinary tract infection    Complication of anesthesia    low o2 sats, coded 30 years ago   CVA (cerebral infarction) 05/2003   Dehydration with hyponatremia 03/23/2018   Depression    Encounter for antineoplastic chemotherapy 10/09/2016    Fatigue 01/03/2015   Gait disorder    Gastric polyps    GERD (gastroesophageal reflux disease)    Goals of care, counseling/discussion 10/09/2016   H/O: CVA (cerebrovascular accident) 07/14/2016   High cholesterol    History of pericarditis    Hyperlipidemia    Hypotension    Hypothyroidism    IBS (irritable bowel syndrome)    Neuroendocrine cancer ([place]) 08/23/2016   Obesity    Paroxysmal atrial fibrillation ([name])    chads2vasc score is 6,  she is felt to be a poor candidate for anticoagulation   Pericarditis    Personal history of arterial venous malformation (AVM)    right side of face   Seizure disorder ([name])    Seizures ([place]) 2003   " smelling"- Gabapentin "no problem"   Shortness of breath dyspnea    with exertion   Sick sinus syndrome ([name])    Sternum fx 10/27/2013   Stroke ([place]) 5 years ago   Right side of face weak, slurred speach-    Thyroid disease    TIA (transient ischemic attack)    UTI (lower urinary tract infection) 03/27/2016   "frequently"    Current Outpatient Medications:    amitriptyline (ELAVIL) 25 MG tablet, TAKE 1 TABLET AT BEDTIME, Disp: 90 tablet, Rfl: 3   amLODipine (NORVASC) 2.5 MG tablet, TAKE 1 TABLET DAILY, Disp: 90 tablet, Rfl:  1   capecitabine (XELODA) 500 MG tablet, TAKE 3 TABLETS (1500MG ) BY MOUTH TWICE DAILY AFTER A MEAL. TAKE ON DAYS 1-14 OF EACH 28 DAY CYCLE, Disp: 84 tablet, Rfl: 2   cephALEXin (KEFLEX) 250 MG capsule, Take 1 capsule (250 mg total) by mouth 3 (three) times daily., Disp: 30 capsule, Rfl: 0   escitalopram (LEXAPRO) 20 MG tablet, TAKE 1 TABLET AT BEDTIME, Disp: 90 tablet, Rfl: 3   fluticasone (FLONASE) 50 MCG/ACT nasal spray, Place 2 sprays into both nostrils at bedtime., Disp: 48 g, Rfl: 3   furosemide (LASIX) 20 MG tablet, TAKE 1 TABLET DAILY AS NEEDED FOR FLUID RETENTION, Disp: 90 tablet, Rfl: 1   gabapentin (NEURONTIN) 400 MG capsule, TAKE 1 TO 2 CAPSULES THREE TIMES A DAY, Disp: 720 capsule,  Rfl: 1   hydroxypropyl methylcellulose / hypromellose (ISOPTO TEARS / GONIOVISC) 2.5 % ophthalmic solution, Place 1 drop into both eyes daily. , Disp: , Rfl:    levothyroxine (SYNTHROID, LEVOTHROID) 75 MCG tablet, Take 1 tablet (75 mcg total) by mouth daily before breakfast., Disp: 90 tablet, Rfl: 1   lovastatin (MEVACOR) 40 MG tablet, Take 1 tablet (40 mg total) by mouth at bedtime., Disp: 90 tablet, Rfl: 1   Melatonin 3 MG TBDP, Take 3-6 mg by mouth at bedtime as needed., Disp: 60 tablet, Rfl: 1   omeprazole (PRILOSEC) 40 MG capsule, Take 1 capsule (40 mg total) by mouth daily., Disp: 90 capsule, Rfl: 1   ondansetron (ZOFRAN) 8 MG tablet, Take 1 tablet (8 mg total) by mouth every 8 (eight) hours as needed for nausea or vomiting., Disp: 90 tablet, Rfl: 1   OXcarbazepine (TRILEPTAL) 150 MG tablet, Take 1 tablet (150 mg total) by mouth 2 (two) times daily., Disp: 180 tablet, Rfl: 3   temozolomide (TEMODAR) 140 MG capsule, TAKE 2 CAPSULES (280MG ) BY MOUTH DAILY FOR 5 DAYS,ON DAYS 10-14 OF EACH 28DAY CYCLE. MAY TAKE ON EMPTY STOMACH AT BEDTIME TO DECREASE NAUSEA, Disp: 10 capsule, Rfl: 2   XARELTO 20 MG TABS tablet, TAKE 1 TABLET DAILY WITH SUPPER, Disp: 90 tablet, Rfl: 4  Assessment/ Plan: 85 y.o. female   1. Acute cystitis with hematuria - cephALEXin (KEFLEX) 250 MG capsule; Take 1 capsule (250 mg total) by mouth 3 (three) times daily.  Dispense: 30 capsule; Refill: 0   No follow-ups on file.  Continue all other maintenance medications as listed above.  Start time: 4:50 PM End time: 4:59 PM  Meds ordered this encounter  Medications   cephALEXin (KEFLEX) 250 MG capsule    Sig: Take 1 capsule (250 mg total) by mouth 3 (three) times daily.    Dispense:  30 capsule    Refill:  0    Order Specific Question:   Supervising Provider    Answer:   Janora Norlander [[id number]]    Particia Nearing PA-C [place] [phone]

## 2019-04-13 ENCOUNTER — Other Ambulatory Visit: Payer: Self-pay

## 2019-04-13 ENCOUNTER — Inpatient Hospital Stay (HOSPITAL_BASED_OUTPATIENT_CLINIC_OR_DEPARTMENT_OTHER): Payer: Medicare Other | Admitting: Physician Assistant

## 2019-04-13 ENCOUNTER — Encounter: Payer: Self-pay | Admitting: Physician Assistant

## 2019-04-13 VITALS — BP 100/86 | HR 59 | Temp 98.0°F | Resp 18 | Ht 64.0 in

## 2019-04-13 DIAGNOSIS — C3492 Malignant neoplasm of unspecified part of left bronchus or lung: Secondary | ICD-10-CM

## 2019-04-13 DIAGNOSIS — Z79899 Other long term (current) drug therapy: Secondary | ICD-10-CM | POA: Diagnosis not present

## 2019-04-13 DIAGNOSIS — C7A090 Malignant carcinoid tumor of the bronchus and lung: Secondary | ICD-10-CM | POA: Diagnosis not present

## 2019-04-13 DIAGNOSIS — Z86711 Personal history of pulmonary embolism: Secondary | ICD-10-CM | POA: Diagnosis not present

## 2019-04-13 DIAGNOSIS — C7A8 Other malignant neuroendocrine tumors: Secondary | ICD-10-CM | POA: Diagnosis not present

## 2019-04-13 DIAGNOSIS — C7B8 Other secondary neuroendocrine tumors: Secondary | ICD-10-CM

## 2019-04-13 DIAGNOSIS — D61818 Other pancytopenia: Secondary | ICD-10-CM | POA: Diagnosis not present

## 2019-04-13 DIAGNOSIS — C787 Secondary malignant neoplasm of liver and intrahepatic bile duct: Secondary | ICD-10-CM | POA: Diagnosis not present

## 2019-04-13 NOTE — Progress Notes (Signed)
[name] OFFICE PROGRESS NOTE  [name] [address]  DIAGNOSIS:  1) Metastatic intermediate. Neuroendocrine tumor of lung primary diagnosed in January 2018 and presented with small bilateral pulmonary nodules in addition to multiple liver metastasis. 2) right lower lobe pulmonary embolism diagnosed incidentally on CT scan of the chest on 04/23/2017  PRIOR THERAPY:  1) Status post radio embolization with Y 90 to the liver lesions by interventional radiology. 2) status post IVC filter placement by interventional radiology on 04/24/2017  CURRENT THERAPY: Xeloda 750 MG/M2 twice a day days 1-14 and Temodar 150 MG/M2 days 10-14 every 4 weeks. Status post 35 cycles. She started cycle #36 on September 5th, 2020  INTERVAL HISTORY: [name] 85 y.o. female returns to the clinic for a follow up visit. The patient is feeling fine but continues to have her baseline productive cough and mucus production for which she uses mucinex. She also reports her baseline shortness of breath. She denies chest pain or hemoptysis. She denies any fever, chills, night sweats, or weight loss.  The patient's husband was available by phone and expressed concern for worsening decline in her memory. The patient's older sister reportedly also had dementia before her passing.   The patient is tolerating her treatment well without any adverse side effects. She denies any nausea or vomiting. The patient reports alternating bowel habits of diarrhea and constipation intermittently. She denies any headaches, extremity weakness, or visual changes. She recently had a restaging CT scan performed. She is here for evaluation and to review her scan results.    MEDICAL HISTORY: Past Medical History:  Diagnosis Date  . Acute respiratory failure with hypoxia ([place]) 10/23/2015  . Allergic rhinitis    PT. DENIES  . Anxiety   . Aortic insufficiency    Echo 04/29/2018: EF 65-70, mild AS  (mean 13), mod AI, Asc Aorta 42 mm (mildly dilated), mild LAE, PASP 41, pericardium normal in appearance.   . Arthritis    NECK  . Ataxia   . Back pain 12/13/2016  . Bradycardia    primarily nocturnal  . Burning tongue syndrome 25 years  . Cancer ([facility]) dx'd 06/2016   liver  . Cataract   . Cerebellar degeneration ([place])   . Chest pain 12/13/2016   Atypical chest pain  . Chronic urinary tract infection   . Complication of anesthesia    low o2 sats, coded 30 years ago  . CVA (cerebral infarction) 05/2003  . Dehydration with hyponatremia 03/23/2018  . Depression   . Encounter for antineoplastic chemotherapy 10/09/2016  . Fatigue 01/03/2015  . Gait disorder   . Gastric polyps   . GERD (gastroesophageal reflux disease)   . Goals of care, counseling/discussion 10/09/2016  . H/O: CVA (cerebrovascular accident) 07/14/2016  . High cholesterol   . History of pericarditis   . Hyperlipidemia   . Hypotension   . Hypothyroidism   . IBS (irritable bowel syndrome)   . Neuroendocrine cancer ([facility]) 08/23/2016  . Obesity   . Paroxysmal atrial fibrillation ([name])    chads2vasc score is 6,  she is felt to be a poor candidate for anticoagulation  . Pericarditis   . Personal history of arterial venous malformation (AVM)    right side of face  . Seizure disorder ([place])   . Seizures ([place]) 2003   " smelling"- Gabapentin "no problem"  . Shortness of breath dyspnea    with exertion  . Sick sinus syndrome ([place])   .  Sternum fx 10/27/2013  . Stroke [facility]) 5 years ago   Right side of face weak, slurred speach-   . Thyroid disease   . TIA (transient ischemic attack)   . UTI (lower urinary tract infection) 03/27/2016   "frequently"    ALLERGIES:  is allergic to levaquin [levofloxacin] and lisinopril.  MEDICATIONS:  Current Outpatient Medications  Medication Sig Dispense Refill  . amitriptyline (ELAVIL) 25 MG tablet TAKE 1 TABLET AT BEDTIME 90 tablet 3  . amLODipine (NORVASC) 2.5 MG tablet TAKE 1 TABLET  DAILY 90 tablet 1  . capecitabine (XELODA) 500 MG tablet TAKE 3 TABLETS (1500MG ) BY MOUTH TWICE DAILY AFTER A MEAL. TAKE ON DAYS 1-14 OF EACH 28 DAY CYCLE 84 tablet 2  . cephALEXin (KEFLEX) 250 MG capsule Take 1 capsule (250 mg total) by mouth 3 (three) times daily. 30 capsule 0  . escitalopram (LEXAPRO) 20 MG tablet TAKE 1 TABLET AT BEDTIME 90 tablet 3  . fluticasone (FLONASE) 50 MCG/ACT nasal spray Place 2 sprays into both nostrils at bedtime. 48 g 3  . furosemide (LASIX) 20 MG tablet TAKE 1 TABLET DAILY AS NEEDED FOR FLUID RETENTION 90 tablet 1  . gabapentin (NEURONTIN) 400 MG capsule TAKE 1 TO 2 CAPSULES THREE TIMES A DAY 720 capsule 1  . hydroxypropyl methylcellulose / hypromellose (ISOPTO TEARS / GONIOVISC) 2.5 % ophthalmic solution Place 1 drop into both eyes daily.     Marland Kitchen levothyroxine (SYNTHROID, LEVOTHROID) 75 MCG tablet Take 1 tablet (75 mcg total) by mouth daily before breakfast. 90 tablet 1  . lovastatin (MEVACOR) 40 MG tablet Take 1 tablet (40 mg total) by mouth at bedtime. 90 tablet 1  . Melatonin 3 MG TBDP Take 3-6 mg by mouth at bedtime as needed. 60 tablet 1  . omeprazole (PRILOSEC) 40 MG capsule Take 1 capsule (40 mg total) by mouth daily. 90 capsule 1  . ondansetron (ZOFRAN) 8 MG tablet Take 1 tablet (8 mg total) by mouth every 8 (eight) hours as needed for nausea or vomiting. 90 tablet 1  . OXcarbazepine (TRILEPTAL) 150 MG tablet Take 1 tablet (150 mg total) by mouth 2 (two) times daily. 180 tablet 3  . temozolomide (TEMODAR) 140 MG capsule TAKE 2 CAPSULES (280MG ) BY MOUTH DAILY FOR 5 DAYS,ON DAYS 10-14 OF EACH 28DAY CYCLE. MAY TAKE ON EMPTY STOMACH AT BEDTIME TO DECREASE NAUSEA 10 capsule 2  . XARELTO 20 MG TABS tablet TAKE 1 TABLET DAILY WITH SUPPER 90 tablet 4   No current facility-administered medications for this visit.     SURGICAL HISTORY:  Past Surgical History:  Procedure Laterality Date  . APPENDECTOMY  85 years old  . COLONOSCOPY  2006, 2009  . COLONOSCOPY WITH  PROPOFOL N/A 03/28/2016   Procedure: COLONOSCOPY WITH PROPOFOL;  Surgeon: Gatha Mayer, MD;  Location: [facility];  Service: Endoscopy;  Laterality: N/A;  . CORONARY ANGIOGRAPHY N/A 05/22/2018   Procedure: CORONARY ANGIOGRAPHY (CATH LAB);  Surgeon: Lorretta Harp, MD;  Location: [facility];  Service: Cardiovascular;  Laterality: N/A;  . cyst removed  35 years ago  . EP IMPLANTABLE DEVICE N/A 01/06/2015   Procedure: Loop Recorder Insertion;  Surgeon: Thompson Grayer, MD;  Location: [facility];  Service: Cardiovascular;  Laterality: N/A;  . EYE SURGERY Right    Cataract  . IR ANGIOGRAM SELECTIVE EACH ADDITIONAL VESSEL  11/28/2016  . IR ANGIOGRAM SELECTIVE EACH ADDITIONAL VESSEL  11/28/2016  . IR ANGIOGRAM SELECTIVE EACH ADDITIONAL VESSEL  11/28/2016  .  IR ANGIOGRAM SELECTIVE EACH ADDITIONAL VESSEL  11/28/2016  . IR ANGIOGRAM SELECTIVE EACH ADDITIONAL VESSEL  12/13/2016  . IR ANGIOGRAM SELECTIVE EACH ADDITIONAL VESSEL  12/13/2016  . IR ANGIOGRAM SELECTIVE EACH ADDITIONAL VESSEL  01/09/2017  . IR ANGIOGRAM VISCERAL SELECTIVE  11/28/2016  . IR ANGIOGRAM VISCERAL SELECTIVE  11/28/2016  . IR ANGIOGRAM VISCERAL SELECTIVE  12/13/2016  . IR ANGIOGRAM VISCERAL SELECTIVE  12/13/2016  . IR ANGIOGRAM VISCERAL SELECTIVE  01/09/2017  . IR EMBO ARTERIAL NOT HEMORR HEMANG INC GUIDE ROADMAPPING  11/28/2016  . IR EMBO TUMOR ORGAN ISCHEMIA INFARCT INC GUIDE ROADMAPPING  12/13/2016  . IR EMBO TUMOR ORGAN ISCHEMIA INFARCT INC GUIDE ROADMAPPING  01/09/2017  . IR IVC FILTER PLMT / S&I /IMG GUID/MOD SED  04/24/2017  . IR RADIOLOGIST EVAL & MGMT  11/06/2016  . IR RADIOLOGIST EVAL & MGMT  01/02/2017  . IR RADIOLOGIST EVAL & MGMT  02/05/2017  . IR RADIOLOGIST EVAL & MGMT  05/02/2017  . IR RADIOLOGIST EVAL & MGMT  10/17/2017  . IR US GUIDE VASC ACCESS RIGHT  11/28/2016  . IR US GUIDE VASC ACCESS RIGHT  12/13/2016  . IR US GUIDE VASC ACCESS RIGHT  01/09/2017  . KNEE ARTHROSCOPY Right 11/14/2006  . KNEE ARTHROSCOPY Bilateral 5 and  6 years ago  . KNEE ARTHROSCOPY WITH LATERAL MENISECTOMY  07/03/2012   Procedure: KNEE ARTHROSCOPY WITH LATERAL MENISECTOMY;  Surgeon: Magnus Sinning, MD;  Location: [facility];  Service: Orthopedics;  Laterality: Left;  with Partial Lateral Menisectomy and Medial Menisectomy. Shaving of medial and lateral femoral condyles. Shaving of patella. Removal of a loose body  . LOOP RECORDER REMOVAL N/A 05/22/2018   Procedure: LOOP RECORDER REMOVAL;  Surgeon: Thompson Grayer, MD;  Location: [facility];  Service: Cardiovascular;  Laterality: N/A;  . PACEMAKER IMPLANT N/A 05/22/2018   MDT Azure XT DR MRI implanted by Dr Rayann Heman for sick sinus syndrome  . tibial and fibular internal fixation Left   . TOTAL ABDOMINAL HYSTERECTOMY  85 years old  . UPPER GASTROINTESTINAL ENDOSCOPY  2009, 2013    REVIEW OF SYSTEMS:   Review of Systems  Constitutional: Positive for fatigue. Negative for appetite change, chills, fever and unexpected weight change.  HENT: Positive for one episode of a nose bleed. Negative for mouth sores, sore throat and trouble swallowing.   Eyes: Negative for eye problems and icterus.  Respiratory: Positive for productive cough (baseline) and baseline shortness of breath. Negative for hemoptysis and wheezing.   Cardiovascular: Negative for chest pain and leg swelling.  Gastrointestinal: Negative for abdominal pain, constipation, diarrhea, nausea and vomiting.  Genitourinary: Negative for bladder incontinence, difficulty urinating, dysuria, frequency and hematuria.   Musculoskeletal: Negative for back pain, gait problem, neck pain and neck stiffness.  Skin: Negative for itching and rash.  Neurological: Positive for declining memory. Negative for dizziness, extremity weakness, gait problem, headaches, light-headedness and seizures.  Hematological: Negative for adenopathy. Does not bruise/bleed easily.  Psychiatric/Behavioral: Negative for confusion, depression and sleep disturbance. The  patient is not nervous/anxious.     PHYSICAL EXAMINATION:  Blood pressure 100/86, pulse (!) 59, temperature 98 F (36.7 C), temperature source Temporal, resp. rate 18, height 5\' 4"  (1.626 m), SpO2 98 %.  ECOG PERFORMANCE STATUS: 1 - Symptomatic but completely ambulatory  Physical Exam  Constitutional: Oriented to person, place, and time and chronically ill appearing female and in no distress.  HENT:  Head: Normocephalic and atraumatic.  Mouth/Throat: Oropharynx is clear and moist. No oropharyngeal exudate.  Eyes: Conjunctivae are normal. Right eye exhibits no discharge. Left eye exhibits no discharge. No scleral icterus.  Neck: Normal range of motion. Neck supple.  Cardiovascular: Normal rate, regular rhythm, normal heart sounds and intact distal pulses.   Pulmonary/Chest: Effort normal and breath sounds normal. No respiratory distress. No wheezes. No rales.  Abdominal: Soft. Bowel sounds are normal. Exhibits no distension and no mass. There is no tenderness.  Musculoskeletal: Normal range of motion. Exhibits no edema.  Lymphadenopathy:    No cervical adenopathy.  Neurological: Strength is symmetric bilaterally in upper and lower extremities. Exhibits normal muscle tone.  Skin: Skin is warm and dry. No rash noted. Not diaphoretic. No erythema. No pallor.  Psychiatric: Mood, memory and judgment normal.  Vitals reviewed.  LABORATORY DATA: Lab Results  Component Value Date   WBC 3.8 (L) 04/10/2019   HGB 11.2 (L) 04/10/2019   HCT 34.2 (L) 04/10/2019   MCV 100.0 04/10/2019   PLT 109 (L) 04/10/2019      Chemistry      Component Value Date/Time   NA 133 (L) 04/10/2019 0906   NA 140 10/10/2018 1548   NA 135 (L) 07/10/2017 1142   K 5.1 04/10/2019 0906   K 4.6 07/10/2017 1142   CL 99 04/10/2019 0906   CO2 27 04/10/2019 0906   CO2 25 07/10/2017 1142   BUN 10 04/10/2019 0906   BUN 14 10/10/2018 1548   BUN 11.4 07/10/2017 1142   CREATININE 0.89 04/10/2019 0906   CREATININE 1.0  07/10/2017 1142      Component Value Date/Time   CALCIUM 9.8 04/10/2019 0906   CALCIUM 9.9 07/10/2017 1142   ALKPHOS 106 04/10/2019 0906   ALKPHOS 93 07/10/2017 1142   AST 23 04/10/2019 0906   AST 43 (H) 07/10/2017 1142   ALT 11 04/10/2019 0906   ALT 28 07/10/2017 1142   BILITOT 0.5 04/10/2019 0906   BILITOT 0.56 07/10/2017 1142       RADIOGRAPHIC STUDIES:  Ct Chest W Contrast  Result Date: 04/10/2019 CLINICAL DATA:  Restaging metastatic neuroendocrine tumor EXAM: CT CHEST, ABDOMEN, AND PELVIS WITH CONTRAST TECHNIQUE: Multidetector CT imaging of the chest, abdomen and pelvis was performed following the standard protocol during bolus administration of intravenous contrast. CONTRAST:  140mL OMNIPAQUE IOHEXOL 300 MG/ML  SOLN COMPARISON:  12/03/2018. FINDINGS: CT CHEST FINDINGS Cardiovascular: The heart size is normal. Left chest wall pacer is noted with leads in the right atrial appendage and right ventricle. Aortic atherosclerosis. Lad and left main coronary artery calcification. Mediastinum/Nodes: No enlarged mediastinal, hilar, or axillary lymph nodes. Thyroid gland, trachea, and esophagus demonstrate no significant findings. Lungs/Pleura: No pleural effusion. No airspace consolidation, atelectasis or pneumothorax. Innumerable small nodules are again noted in both lungs. -Nodule is in the anteromedial right upper lobe measuring 10 mm, image 47/5. Unchanged. -4 mm anterolateral right upper lobe lung nodules unchanged, image 41/4. -4 mm perifissural nodule in the left midlung is unchanged, image 58/4. Musculoskeletal: No chest wall mass or suspicious bone lesions identified. CT ABDOMEN PELVIS FINDINGS Hepatobiliary: Multiple liver lesions again noted. -Index lesion within segment 2 measures 1.5 cm, image 40/2. Unchanged from previous exam. -index lesion within segment 7 measures 1.2 cm, image 40/2. Unchanged. -Dominant lesion within segment 2/3 measures 4 cm, image 48/2. Previously 3.2 cm. No new  lesions. Collapsed gallbladder containing multiple small stones measuring up to 5 mm. No wall thickening or inflammation. No biliary ductal dilatation. Pancreas: Unremarkable. No pancreatic ductal dilatation or surrounding inflammatory changes. Spleen: Normal  in size without focal abnormality. Adrenals/Urinary Tract: Normal appearance of the adrenal glands. Low-attenuation lateral cortex right kidney lesion measures 7 mm and is too small to characterize. No hydronephrosis identified bilaterally. Urinary bladder normal. Stomach/Bowel: Stomach normal. The small bowel loops are unremarkable. Unremarkable appearance of the colon. Vascular/Lymphatic: Aortic atherosclerosis. No aneurysm. IVC filter. No abdominal adenopathy. No pelvic or inguinal adenopathy. Reproductive: Status post hysterectomy. No adnexal masses. Other: No abdominal wall hernia or abnormality. No abdominopelvic ascites. Musculoskeletal: Mild scoliosis and degenerative disc disease. No acute or suspicious osseous abnormality. IMPRESSION: 1. The dominant liver lesion within the lateral segment of left lobe of liver is mildly increased in size in the interval. The other liver lesions are stable. 2. Unchanged pulmonary nodules. 3. Aortic Atherosclerosis (ICD10-I70.0). Coronary artery calcifications. Electronically Signed   By: Kerby Moors M.D.   On: 04/10/2019 11:21   Ct Abdomen Pelvis W Contrast  Result Date: 04/10/2019 CLINICAL DATA:  Restaging metastatic neuroendocrine tumor EXAM: CT CHEST, ABDOMEN, AND PELVIS WITH CONTRAST TECHNIQUE: Multidetector CT imaging of the chest, abdomen and pelvis was performed following the standard protocol during bolus administration of intravenous contrast. CONTRAST:  128mL OMNIPAQUE IOHEXOL 300 MG/ML  SOLN COMPARISON:  12/03/2018. FINDINGS: CT CHEST FINDINGS Cardiovascular: The heart size is normal. Left chest wall pacer is noted with leads in the right atrial appendage and right ventricle. Aortic atherosclerosis.  Lad and left main coronary artery calcification. Mediastinum/Nodes: No enlarged mediastinal, hilar, or axillary lymph nodes. Thyroid gland, trachea, and esophagus demonstrate no significant findings. Lungs/Pleura: No pleural effusion. No airspace consolidation, atelectasis or pneumothorax. Innumerable small nodules are again noted in both lungs. -Nodule is in the anteromedial right upper lobe measuring 10 mm, image 47/5. Unchanged. -4 mm anterolateral right upper lobe lung nodules unchanged, image 41/4. -4 mm perifissural nodule in the left midlung is unchanged, image 58/4. Musculoskeletal: No chest wall mass or suspicious bone lesions identified. CT ABDOMEN PELVIS FINDINGS Hepatobiliary: Multiple liver lesions again noted. -Index lesion within segment 2 measures 1.5 cm, image 40/2. Unchanged from previous exam. -index lesion within segment 7 measures 1.2 cm, image 40/2. Unchanged. -Dominant lesion within segment 2/3 measures 4 cm, image 48/2. Previously 3.2 cm. No new lesions. Collapsed gallbladder containing multiple small stones measuring up to 5 mm. No wall thickening or inflammation. No biliary ductal dilatation. Pancreas: Unremarkable. No pancreatic ductal dilatation or surrounding inflammatory changes. Spleen: Normal in size without focal abnormality. Adrenals/Urinary Tract: Normal appearance of the adrenal glands. Low-attenuation lateral cortex right kidney lesion measures 7 mm and is too small to characterize. No hydronephrosis identified bilaterally. Urinary bladder normal. Stomach/Bowel: Stomach normal. The small bowel loops are unremarkable. Unremarkable appearance of the colon. Vascular/Lymphatic: Aortic atherosclerosis. No aneurysm. IVC filter. No abdominal adenopathy. No pelvic or inguinal adenopathy. Reproductive: Status post hysterectomy. No adnexal masses. Other: No abdominal wall hernia or abnormality. No abdominopelvic ascites. Musculoskeletal: Mild scoliosis and degenerative disc disease. No  acute or suspicious osseous abnormality. IMPRESSION: 1. The dominant liver lesion within the lateral segment of left lobe of liver is mildly increased in size in the interval. The other liver lesions are stable. 2. Unchanged pulmonary nodules. 3. Aortic Atherosclerosis (ICD10-I70.0). Coronary artery calcifications. Electronically Signed   By: Kerby Moors M.D.   On: 04/10/2019 11:21     ASSESSMENT/PLAN:  This is a very pleasant 85 year old Caucasian female with metastatic intermediate grade neuroendocrine carcinoma and questionable lung primary and multiple metastatic liver lesions and pancreatic lesions.  She was diagnosed in January 2018.  She is status post treatment with radio embolization with Y90 to the left and right lobe liver lesions.    She is currently undergoing systemic chemotherapy with Xeloda and Temodar.  She is status post 35 cycles. The current cycle started on 04/04/2019.  The patient recently had a restaging staging CT scan performed.  Dr. [name] personally and independently reviewed the scan and discussed the results with the patient today. The scan did not show any evidence of any disease progression except for mild increase in the dominant liver lesion .  Dr. [name] recommends that the patient continue treatment on the same dose.  She will start with cycle #37 on October 3rd, 2020 as scheduled.  We will see her back in 6 weeks for evaluation and repeat blood work. The patient was advised to call immediately if she has any concerning symptoms in the interval. The patient voices understanding of current disease status and treatment options and is in agreement with the current care plan. All questions were answered. The patient knows to call the clinic with any problems, questions or concerns. We can certainly see the patient much sooner if necessary  Orders Placed This Encounter  Procedures  . CMP ([facility] only)    Standing Status:   Future    Standing Expiration  Date:   04/12/2020  . CBC with Differential (Cancer Center Only)    Standing Status:   Future    Standing Expiration Date:   04/12/2020     Tobe Sos Heilingoetter, PA-C 04/13/19  ADDENDUM: Hematology/Oncology Attending: I had a face-to-face encounter with the patient today.  I recommended her care plan.  This is a very pleasant 85 years old white female with metastatic intermediate grade neuroendocrine carcinoma with questionable lung primary and multiple metastatic liver lesion as well as pancreatic lesions.  The patient status post radio embolization with Y 90 to the left and right lobes of the liver lesions.  She is also on treatment with Temodar and Xeloda status post 35 cycles and has been tolerating her treatment well with no concerning complaints except for mild fatigue.  She had repeat CT scan of the chest, abdomen pelvis performed recently.  I personally and independently reviewed the scans and discussed the results with the patient and her husband. Her scan showed no significant findings for disease progression but there was some minor changes in some of the liver lesion. I recommended for the patient to continue her current treatment with Xeloda and Temodar as planned. She will come back for follow-up visit in 6 weeks for evaluation with repeat blood work. The patient was advised to call immediately if she has any other concerning symptoms in the interval.  Disclaimer: This note was dictated with voice recognition software. Similar sounding words can inadvertently be transcribed and may be missed upon review. Eilleen Kempf, MD 04/13/19

## 2019-04-14 ENCOUNTER — Ambulatory Visit (INDEPENDENT_AMBULATORY_CARE_PROVIDER_SITE_OTHER): Payer: Medicare Other | Admitting: Family Medicine

## 2019-04-14 ENCOUNTER — Encounter: Payer: Self-pay | Admitting: Family Medicine

## 2019-04-14 ENCOUNTER — Telehealth: Payer: Self-pay | Admitting: Internal Medicine

## 2019-04-14 ENCOUNTER — Other Ambulatory Visit: Payer: Self-pay | Admitting: Family Medicine

## 2019-04-14 DIAGNOSIS — E039 Hypothyroidism, unspecified: Secondary | ICD-10-CM | POA: Diagnosis not present

## 2019-04-14 DIAGNOSIS — I10 Essential (primary) hypertension: Secondary | ICD-10-CM | POA: Diagnosis not present

## 2019-04-14 DIAGNOSIS — K219 Gastro-esophageal reflux disease without esophagitis: Secondary | ICD-10-CM | POA: Diagnosis not present

## 2019-04-14 DIAGNOSIS — I2699 Other pulmonary embolism without acute cor pulmonale: Secondary | ICD-10-CM

## 2019-04-14 DIAGNOSIS — I480 Paroxysmal atrial fibrillation: Secondary | ICD-10-CM | POA: Diagnosis not present

## 2019-04-14 DIAGNOSIS — E782 Mixed hyperlipidemia: Secondary | ICD-10-CM

## 2019-04-14 DIAGNOSIS — N3090 Cystitis, unspecified without hematuria: Secondary | ICD-10-CM | POA: Diagnosis not present

## 2019-04-14 DIAGNOSIS — C3492 Malignant neoplasm of unspecified part of left bronchus or lung: Secondary | ICD-10-CM

## 2019-04-14 DIAGNOSIS — F411 Generalized anxiety disorder: Secondary | ICD-10-CM | POA: Diagnosis not present

## 2019-04-14 DIAGNOSIS — E038 Other specified hypothyroidism: Secondary | ICD-10-CM | POA: Diagnosis not present

## 2019-04-14 DIAGNOSIS — Z889 Allergy status to unspecified drugs, medicaments and biological substances status: Secondary | ICD-10-CM | POA: Diagnosis not present

## 2019-04-14 DIAGNOSIS — R609 Edema, unspecified: Secondary | ICD-10-CM

## 2019-04-14 LAB — MICROSCOPIC EXAMINATION: WBC, UA: >30 /hpf — AB (ref 0–5)

## 2019-04-14 LAB — URINALYSIS, COMPLETE
Bilirubin, UA: NEGATIVE
Glucose, UA: NEGATIVE
Ketones, UA: NEGATIVE
Nitrite, UA: POSITIVE — AB
Protein,UA: NEGATIVE
Specific Gravity, UA: 1.025 (ref 1.005–1.030)
Urobilinogen, Ur: 0.2 mg/dL (ref 0.2–1.0)
pH, UA: 6 (ref 5.0–7.5)

## 2019-04-14 MED ORDER — GABAPENTIN 400 MG PO CAPS: ORAL_CAPSULE | ORAL | 1 refills | Status: DC

## 2019-04-14 MED ORDER — RIVAROXABAN 20 MG PO TABS: ORAL_TABLET | ORAL | 3 refills | Status: DC

## 2019-04-14 MED ORDER — OXCARBAZEPINE 150 MG PO TABS: 150.0000 mg | ORAL_TABLET | Freq: Two times a day (BID) | ORAL | 3 refills | Status: DC

## 2019-04-14 MED ORDER — SULFAMETHOXAZOLE-TRIMETHOPRIM 800-160 MG PO TABS: 1.0000 | ORAL_TABLET | Freq: Two times a day (BID) | ORAL | 0 refills | Status: DC

## 2019-04-14 MED ORDER — ONDANSETRON HCL 8 MG PO TABS: 8.0000 mg | ORAL_TABLET | Freq: Three times a day (TID) | ORAL | 1 refills | Status: DC | PRN

## 2019-04-14 MED ORDER — FLUTICASONE PROPIONATE 50 MCG/ACT NA SUSP: 2.0000 | Freq: Every day | NASAL | 3 refills | Status: DC

## 2019-04-14 MED ORDER — FUROSEMIDE 20 MG PO TABS: ORAL_TABLET | ORAL | 1 refills | Status: DC

## 2019-04-14 MED ORDER — OMEPRAZOLE 40 MG PO CPDR: 40.0000 mg | DELAYED_RELEASE_CAPSULE | Freq: Every day | ORAL | 1 refills | Status: DC

## 2019-04-14 MED ORDER — LOVASTATIN 40 MG PO TABS: 40.0000 mg | ORAL_TABLET | Freq: Every day | ORAL | 1 refills | Status: DC

## 2019-04-14 MED ORDER — LEVOTHYROXINE SODIUM 75 MCG PO TABS: 75.0000 ug | ORAL_TABLET | Freq: Every day | ORAL | 1 refills | Status: DC

## 2019-04-14 NOTE — Telephone Encounter (Signed)
Scheduled appt per 9/14 los - pt husband aware of appt date and time

## 2019-04-14 NOTE — Progress Notes (Signed)
Subjective:    Patient ID: [name], female    DOB: [birth year], 85 y.o.   MRN: [id number]   HPI: [name] is a 85 y.o. female presenting for three month check up. Oncologist told her yesterday that all was going well. As a result of chemo she is Losing some short term memory and having hallucinations.   Patient presents for follow-up on  thyroid. The patient has a history of hypothyroidism for many years. It has been stable recently. Pt. denies any change in  voice, loss of hair, heat or cold intolerance. Energy level has been adequate to good. Patient denies constipation and diarrhea. No myxedema. Medication is as noted below. Verified that pt is taking it daily on an empty stomach. Well tolerated.    Follow-up of hypertension. Patient has no history of headache chest pain or shortness of breath or recent cough. Patient also denies symptoms of TIA such as numbness weakness lateralizing. Patient checks  blood pressure at home and has not had any elevated readings recently. Patient denies side effects from his medication. States taking it regularly. O2 sat = 98, HR60, BP 135/60, T 97.0  KEflex not helping her UTI.  Still having frequent urination. Burning has stopped.  Would like to bring in a new specimen for further evaluation.  She is handling the anxiety well related to her chemotherapy.  She was does want to be sure that Lexapro is refilled however.  Her husband says he is proud of how well she is dealing with her situation.  Patient in for follow-up of elevated cholesterol. Doing well without complaints on current medication. Denies side effects of statin including myalgia and arthralgia and nausea. Also in today for liver function testing. Currently no chest pain, shortness of breath or other cardiovascular related symptoms noted.  Patient in for follow-up of GERD. Currently asymptomatic taking  PPI daily. There is no chest pain or heartburn. No hematemesis and no melena.  No dysphagia or choking. Onset is remote. Progression is stable. Complicating factors, none.   Patient in for follow-up of atrial fibrillation. Patient denies any recent bouts of chest pain or palpitations. Additionally, patient is taking anticoagulants. Patient denies any recent excessive bleeding episodes including epistaxis, bleeding from the gums, genitalia, rectal bleeding or hematuria. Additionally there has been no excessive bruising.   Depression screen [facility] 2/9 01/01/2019 12/04/2018 10/10/2018 07/10/2018 06/04/2018  Decreased Interest 0 0 0 0 2  Down, Depressed, Hopeless 0 0 0 0 1  PHQ - 2 Score 0 0 0 0 3  Altered sleeping - - - - 2  Tired, decreased energy - - - - 2  Change in appetite - - - - 2  Feeling bad or failure about yourself  - - - - 1  Trouble concentrating - - - - 2  Moving slowly or fidgety/restless - - - - 3  Suicidal thoughts - - - - 1  PHQ-9 Score - - - - 16  Difficult doing work/chores - - - - Somewhat difficult  Some recent data might be hidden     Relevant past medical, surgical, family and social history reviewed and updated as indicated.  Interim medical history since our last visit reviewed. Allergies and medications reviewed and updated.  ROS:  Review of Systems  Constitutional: Negative.   HENT: Negative for congestion.   Eyes: Negative for visual disturbance.  Respiratory: Negative for shortness of breath.   Cardiovascular: Negative for chest pain.  Gastrointestinal: Negative  for abdominal pain, constipation, diarrhea, nausea and vomiting.  Genitourinary: Negative for difficulty urinating.  Musculoskeletal: Negative for arthralgias and myalgias.  Neurological: Negative for headaches.  Psychiatric/Behavioral: Positive for confusion and hallucinations. Negative for sleep disturbance. The patient is nervous/anxious.      Social History   Tobacco Use  Smoking Status Former Smoker  . Packs/day: 0.25  . Years: 10.00  . Pack years: 2.50  . Types:  Cigarettes  . Quit date: 07/31/1975  . Years since quitting: 43.7  Smokeless Tobacco Never Used       Objective:     Wt Readings from Last 3 Encounters:  03/05/19 222 lb (100.7 kg)  03/03/19 222 lb 4.8 oz (100.8 kg)  01/20/19 222 lb 12.8 oz (101.1 kg)     Exam deferred. Pt. Harboring due to COVID 19. Phone visit performed.   Assessment & Plan:   1. Essential hypertension   2. Hypothyroidism, unspecified type   3. Paroxysmal atrial fibrillation ([name])   4. GAD (generalized anxiety disorder)   5. Primary malignant neoplasm of left lung metastatic to other site ([place])   6. Cystitis   7. Swelling   8. Multiple allergies   9. Other specified hypothyroidism   10. Mixed hyperlipidemia   11. Gastroesophageal reflux disease, esophagitis presence not specified   12. Other acute pulmonary embolism without acute cor pulmonale ([name])     Meds ordered this encounter  Medications  . furosemide (LASIX) 20 MG tablet    Sig: TAKE 1 TABLET DAILY AS NEEDED FOR FLUID RETENTION    Dispense:  90 tablet    Refill:  1  . fluticasone (FLONASE) 50 MCG/ACT nasal spray    Sig: Place 2 sprays into both nostrils at bedtime.    Dispense:  48 g    Refill:  3  . gabapentin (NEURONTIN) 400 MG capsule    Sig: TAKE 1 TO 2 CAPSULES THREE TIMES A DAY    Dispense:  720 capsule    Refill:  1  . levothyroxine (SYNTHROID) 75 MCG tablet    Sig: Take 1 tablet (75 mcg total) by mouth daily before breakfast.    Dispense:  90 tablet    Refill:  1  . lovastatin (MEVACOR) 40 MG tablet    Sig: Take 1 tablet (40 mg total) by mouth at bedtime.    Dispense:  90 tablet    Refill:  1  . omeprazole (PRILOSEC) 40 MG capsule    Sig: Take 1 capsule (40 mg total) by mouth daily.    Dispense:  90 capsule    Refill:  1  . ondansetron (ZOFRAN) 8 MG tablet    Sig: Take 1 tablet (8 mg total) by mouth every 8 (eight) hours as needed for nausea or vomiting.    Dispense:  90 tablet    Refill:  1  . OXcarbazepine (TRILEPTAL)  150 MG tablet    Sig: Take 1 tablet (150 mg total) by mouth 2 (two) times daily.    Dispense:  180 tablet    Refill:  3  . rivaroxaban (XARELTO) 20 MG TABS tablet    Sig: TAKE 1 TABLET DAILY WITH SUPPER    Dispense:  90 tablet    Refill:  3    Orders Placed This Encounter  Procedures  . Urine Culture  . Urinalysis, Complete      Diagnoses and all orders for this visit:  Essential hypertension  Hypothyroidism, unspecified type  Paroxysmal atrial fibrillation ([name])  GAD (generalized  anxiety disorder)  Primary malignant neoplasm of left lung metastatic to other site [facility])  Cystitis -     Urinalysis, Complete -     Urine Culture  Swelling -     furosemide (LASIX) 20 MG tablet; TAKE 1 TABLET DAILY AS NEEDED FOR FLUID RETENTION  Multiple allergies -     fluticasone (FLONASE) 50 MCG/ACT nasal spray; Place 2 sprays into both nostrils at bedtime.  Other specified hypothyroidism -     levothyroxine (SYNTHROID) 75 MCG tablet; Take 1 tablet (75 mcg total) by mouth daily before breakfast.  Mixed hyperlipidemia -     lovastatin (MEVACOR) 40 MG tablet; Take 1 tablet (40 mg total) by mouth at bedtime.  Gastroesophageal reflux disease, esophagitis presence not specified -     omeprazole (PRILOSEC) 40 MG capsule; Take 1 capsule (40 mg total) by mouth daily.  Other acute pulmonary embolism without acute cor pulmonale ([name]) -     rivaroxaban (XARELTO) 20 MG TABS tablet; TAKE 1 TABLET DAILY WITH SUPPER  Other orders -     gabapentin (NEURONTIN) 400 MG capsule; TAKE 1 TO 2 CAPSULES THREE TIMES A DAY -     ondansetron (ZOFRAN) 8 MG tablet; Take 1 tablet (8 mg total) by mouth every 8 (eight) hours as needed for nausea or vomiting. -     OXcarbazepine (TRILEPTAL) 150 MG tablet; Take 1 tablet (150 mg total) by mouth 2 (two) times daily.    Virtual Visit via telephone Note  I discussed the limitations, risks, security and privacy concerns of performing an evaluation and management  service by telephone and the availability of in person appointments. The patient was identified with two identifiers. Pt.expressed understanding and agreed to proceed. Pt. Is at home. Dr. [name] is in his office.  Follow Up Instructions:   I discussed the assessment and treatment plan with the patient. The patient was provided an opportunity to ask questions and all were answered. The patient agreed with the plan and demonstrated an understanding of the instructions.   The patient was advised to call back or seek an in-person evaluation if the symptoms worsen or if the condition fails to improve as anticipated.   Total minutes including chart review and phone contact time: 27   Follow up plan: Return in about 3 months (around 07/14/2019).  [name], MD [place]

## 2019-04-15 ENCOUNTER — Ambulatory Visit: Payer: Medicare Other | Admitting: Family Medicine

## 2019-04-18 LAB — URINE CULTURE

## 2019-04-24 ENCOUNTER — Telehealth: Payer: Self-pay

## 2019-04-24 NOTE — Telephone Encounter (Signed)
    COVID-19 Pre-Screening Questions:  . In the past 7 to 10 days have you had a cough,  shortness of breath, headache, congestion, fever (100 or greater) body aches, chills, sore throat, or sudden loss of taste or sense of smell? NO . Have you been around anyone with known Covid 19? NO . Have you been around anyone who is awaiting Covid 19 test results in the past 7 to 10 days? NO . Have you been around anyone who has been exposed to Covid 19, or has mentioned symptoms of Covid 19 within the past 7 to 10 days? NO  If you have any concerns/questions about symptoms patients report during screening (either on the phone or at threshold). Contact the provider seeing the patient or DOD for further guidance.  If neither are available contact a member of the leadership team.  CONTACTED PT TO INFORM THAT DR. Gwenlyn Found SEEING PTS IN THE OFFICE ON 9/29. PT OKAY WITH PRESENTING TO OFFICE ON 9/29. AWARE THAT MASK REQUIRED IN THE OFFICE AND AWARE OF GUEST POLICY. PT VERBALIZED UNDERSTANDING AND WILL PRESENT FOR 9/29 11:45AM APPT

## 2019-04-28 ENCOUNTER — Encounter: Payer: Self-pay | Admitting: Cardiovascular Disease

## 2019-04-28 ENCOUNTER — Other Ambulatory Visit: Payer: Self-pay

## 2019-04-28 ENCOUNTER — Ambulatory Visit (INDEPENDENT_AMBULATORY_CARE_PROVIDER_SITE_OTHER): Payer: Medicare Other | Admitting: Cardiovascular Disease

## 2019-04-28 DIAGNOSIS — I350 Nonrheumatic aortic (valve) stenosis: Secondary | ICD-10-CM | POA: Diagnosis not present

## 2019-04-28 DIAGNOSIS — I480 Paroxysmal atrial fibrillation: Secondary | ICD-10-CM

## 2019-04-28 DIAGNOSIS — E782 Mixed hyperlipidemia: Secondary | ICD-10-CM | POA: Diagnosis not present

## 2019-04-28 DIAGNOSIS — R001 Bradycardia, unspecified: Secondary | ICD-10-CM | POA: Diagnosis not present

## 2019-04-28 DIAGNOSIS — I10 Essential (primary) hypertension: Secondary | ICD-10-CM

## 2019-04-28 MED FILL — CAPECITABINE 500 MG TABS: 500 | 14 days supply | Qty: 84 | Fill #1

## 2019-04-28 MED FILL — TEMOZOLOMIDE 140 MG CAPS: 140 | 28 days supply | Qty: 10 | Fill #1

## 2019-04-28 NOTE — Assessment & Plan Note (Signed)
History of essential hypertension with blood pressure measured today at 128/61.  She is on low-dose amlodipine.

## 2019-04-28 NOTE — Assessment & Plan Note (Signed)
History of PAF maintaining sinus rhythm and atrially paced on Xarelto oral anticoagulation.

## 2019-04-28 NOTE — Assessment & Plan Note (Signed)
History of sinus bradycardia and PAF status post permanent transvenous pacemaker insertion by Dr. Rayann Heman 05/22/2018.  She is atrially paced today.  She feels clinically improved since the pacemaker was placed.

## 2019-04-28 NOTE — Assessment & Plan Note (Signed)
History of mild aortic stenosis with 2D echo performed 04/29/2018 revealing normal LV systolic pressure with an aortic valve area of 2 cm and a peak gradient of 25 mmHg.  She is nonambulatory.  I do not think she would be a candidate for aortic valve replacement in the future and therefore we will not recheck a 2D echo at this time.

## 2019-04-28 NOTE — Patient Instructions (Addendum)
Medication Instructions:  Your physician recommends that you continue on your current medications as directed. Please refer to the Current Medication list given to you today.  If you need a refill on your cardiac medications before your next appointment, please call your pharmacy.   Lab work: none If you have labs (blood work) drawn today and your tests are completely normal, you will receive your results only by: Marland Kitchen MyChart Message (if you have MyChart) OR . A paper copy in the mail If you have any lab test that is abnormal or we need to change your treatment, we will call you to review the results.  Testing/Procedures: none  Follow-Up: At [facility], you and your health needs are our priority.  As part of our continuing mission to provide you with exceptional heart care, we have created designated Provider Care Teams.  These Care Teams include your primary Cardiologist (physician) and Advanced Practice Providers (APPs -  Physician Assistants and Nurse Practitioners) who all work together to provide you with the care you need, when you need it. You will need a follow up appointment in 12 months with Dr. Quay Burow.  Please call our office 2 months in advance to schedule this appointment.     Any Other Special Instructions Will Be Listed Below (If Applicable). PLEASE SCHEDULE AN OFFICE VISIT WITH DR. Jeneen Rinks ALLRED TO FOLLOW UP ON YOUR PACEMAKER

## 2019-04-28 NOTE — Assessment & Plan Note (Signed)
History of hyperlipidemia on lovastatin with lipid profile performed 10/10/2018 revealing total cholesterol 133, LDL 59 and HDL of 48.

## 2019-04-28 NOTE — Progress Notes (Signed)
04/28/2019 [name]   [birth year]  [id number]  Primary Physician Rebekah Fraise, MD Primary Cardiologist: Rebekah Harp MD [name], [place], [place]  HPI:  [name] is a 85 y.o.  moderately overweight married Caucasian female mother of 30 (son 32 years old died 53/2 years ago prostate CA), grandmother of 2 grandchildren, who was formally a patient of Dr. Delfino Paul [name].  She is accompanied by her husband [name] today.  I last saw her in the office  06/24/2018.Marland Kitchen She has no prior cardiac history.She does have a history of treated hypertension and hyperlipidemia as well as a family history of heart disease. Her father died at age 45 from a heart attack as did her brother. She has another brother who is in open-heart surgery. She has never had a heart attack but has had "mini strokes" in the past. She does get dyspnea on exertion but denies chest pain. I left cell her one year ago. She apparently had Ace sternal fracture secondary to a fall 6 months ago and had a chest CT that showed abnormalities in her lung parenchyma which has been followed by serial CT scans. She was recently admitted to, hospital for chest pain rule out MI. CT angiogram was negative for PE. A Myoview stress test was negative as well. She complains of episodes of weakness as well as palpitations. She does have blood pressure and heart rate readings with some relative bradycardia. Because of palpitations and weakness and performed a 30 day event monitor which showed predominantly sinus rhythm with episodes of sinus bradycardia, pauses up to 2 seconds and a short run of paroxysmal fibrillation with a rapid ventricular response. She is not a good oral anticoagulation patient because of frequent falls and AVMs.  She had a loop recorder implanted by Dr. Rayann Paul which has shown some brief episodes of PAF but no arrhythmias that would be contributing to syncope. She is on Xarelto oral anticoagulation.  Apparently her  loop recorder has shown bradycardia which may be contributory to her symptoms. Dr. Rayann Paul is planning on removing her loop recorder tomorrow and placing a permanent transvenous pacemaker. Because of her ongoing chest pain without prior cardiac catheterization it was decided to proceed with outpatient radial diagnostic cath in the morning to define her coronary anatomy prior to pacemaker insertion.  I performed outpatient radial diagnostic cath 05/22/2018 revealing minimal CAD, normal LV function.  She then underwent permanent transvenous pacemaker insertion by Dr. Rayann Paul the following day because of symptomatic bradycardia with Medtronic XT DR  MRI SureScan model.  Since I saw her a year ago she is remained stable.  She is wheelchair bound because of cerebellar degeneration.  She does not denies chest pain or shortness of breath.    Current Meds  Medication Sig  . amitriptyline (ELAVIL) 25 MG tablet TAKE 1 TABLET AT BEDTIME  . amLODipine (NORVASC) 2.5 MG tablet TAKE 1 TABLET DAILY  . capecitabine (XELODA) 500 MG tablet TAKE 3 TABLETS (1500MG ) BY MOUTH TWICE DAILY AFTER A MEAL. TAKE ON DAYS 1-14 OF EACH 28 DAY CYCLE  . escitalopram (LEXAPRO) 20 MG tablet TAKE 1 TABLET AT BEDTIME  . fluticasone (FLONASE) 50 MCG/ACT nasal spray Place 2 sprays into both nostrils at bedtime.  . furosemide (LASIX) 20 MG tablet TAKE 1 TABLET DAILY AS NEEDED FOR FLUID RETENTION  . gabapentin (NEURONTIN) 400 MG capsule TAKE 1 TO 2 CAPSULES THREE TIMES A DAY  . hydroxypropyl methylcellulose / hypromellose (ISOPTO TEARS / GONIOVISC)  2.5 % ophthalmic solution Place 1 drop into both eyes daily.   Marland Kitchen levothyroxine (SYNTHROID) 75 MCG tablet Take 1 tablet (75 mcg total) by mouth daily before breakfast.  . lovastatin (MEVACOR) 40 MG tablet Take 1 tablet (40 mg total) by mouth at bedtime.  . Melatonin 3 MG TBDP Take 3-6 mg by mouth at bedtime as needed.  Marland Kitchen omeprazole (PRILOSEC) 40 MG capsule Take 1 capsule (40 mg total) by  mouth daily.  . ondansetron (ZOFRAN) 8 MG tablet Take 1 tablet (8 mg total) by mouth every 8 (eight) hours as needed for nausea or vomiting.  . OXcarbazepine (TRILEPTAL) 150 MG tablet Take 1 tablet (150 mg total) by mouth 2 (two) times daily.  . rivaroxaban (XARELTO) 20 MG TABS tablet TAKE 1 TABLET DAILY WITH SUPPER  . temozolomide (TEMODAR) 140 MG capsule TAKE 2 CAPSULES (280MG ) BY MOUTH DAILY FOR 5 DAYS,ON DAYS 10-14 OF EACH 28DAY CYCLE. MAY TAKE ON EMPTY STOMACH AT BEDTIME TO DECREASE NAUSEA     Allergies  Allergen Reactions  . Levaquin [Levofloxacin] Shortness Of Breath  . Lisinopril Cough    Social History   Socioeconomic History  . Marital status: Married    Spouse name: [name]  . Number of children: 6  . Years of education: 40  . Highest education level: 10th grade  Occupational History  . Occupation: Agricultural engineer  Social Needs  . Financial resource strain: Not very hard  . Food insecurity    Worry: Never true    Inability: Never true  . Transportation needs    Medical: No    Non-medical: No  Tobacco Use  . Smoking status: Former Smoker    Packs/day: 0.25    Years: 10.00    Pack years: 2.50    Types: Cigarettes    Quit date: 07/31/1975    Years since quitting: 43.7  . Smokeless tobacco: Never Used  Substance and Sexual Activity  . Alcohol use: No    Comment: Rare- maybe a drink ever 2 years  . Drug use: No  . Sexual activity: Not Currently  Lifestyle  . Physical activity    Days per week: 0 days    Minutes per session: 0 min  . Stress: Not at all  Relationships  . Social connections    Talks on phone: More than three times a week    Gets together: More than three times a week    Attends religious service: Never    Active member of club or organization: No    Attends meetings of clubs or organizations: Never    Relationship status: Married  . Intimate partner violence    Fear of current or ex partner: No    Emotionally abused: No    Physically  abused: No    Forced sexual activity: No  Other Topics Concern  . Not on file  Social History Narrative   Lives with husband, does have stairs, does not use them. Pt completed 10th grade.     Review of Systems: General: negative for chills, fever, night sweats or weight changes.  Cardiovascular: negative for chest pain, dyspnea on exertion, edema, orthopnea, palpitations, paroxysmal nocturnal dyspnea or shortness of breath Dermatological: negative for rash Respiratory: negative for cough or wheezing Urologic: negative for hematuria Abdominal: negative for nausea, vomiting, diarrhea, bright red blood per rectum, melena, or hematemesis Neurologic: negative for visual changes, syncope, or dizziness All other systems reviewed and are otherwise negative except as noted above.    Blood  pressure 128/61, pulse 60, temperature 97.7 F (36.5 C), height 5\' 4"  (1.626 m), weight 217 lb (98.4 kg), SpO2 97 %.  General appearance: alert and no distress Neck: no adenopathy, no JVD, supple, symmetrical, trachea midline, thyroid not enlarged, symmetric, no tenderness/mass/nodules and Soft left carotid bruit Lungs: clear to auscultation bilaterally Heart: regular rate and rhythm, S1, S2 normal, no murmur, click, rub or gallop Extremities: extremities normal, atraumatic, no cyanosis or edema Pulses: 2+ and symmetric Skin: Skin color, texture, turgor normal. No rashes or lesions Neurologic: Alert and oriented X 3, normal strength and tone. Normal symmetric reflexes. Normal coordination and gait  EKG atrially paced rhythm at 60 without ST or T wave changes.  I personally reviewed this EKG.  ASSESSMENT AND PLAN:   Hyperlipemia History of hyperlipidemia on lovastatin with lipid profile performed 10/10/2018 revealing total cholesterol 133, LDL 59 and HDL of 48.  Essential hypertension History of essential hypertension with blood pressure measured today at 128/61.  She is on low-dose amlodipine.  Sinus  bradycardia History of sinus bradycardia and PAF status post permanent transvenous pacemaker insertion by Dr. Rayann Paul 05/22/2018.  She is atrially paced today.  She feels clinically improved since the pacemaker was placed.  Paroxysmal atrial fibrillation ([name]) History of PAF maintaining sinus rhythm and atrially paced on Xarelto oral anticoagulation.  Mild aortic stenosis History of mild aortic stenosis with 2D echo performed 04/29/2018 revealing normal LV systolic pressure with an aortic valve area of 2 cm and a peak gradient of 25 mmHg.  She is nonambulatory.  I do not think she would be a candidate for aortic valve replacement in the future and therefore we will not recheck a 2D echo at this time.      Rebekah Harp MD FACP,FACC,FAHA, [facility] 04/28/2019 11:54 AM

## 2019-05-25 ENCOUNTER — Ambulatory Visit (INDEPENDENT_AMBULATORY_CARE_PROVIDER_SITE_OTHER): Payer: Medicare Other | Admitting: Nurse Practitioner

## 2019-05-25 ENCOUNTER — Encounter: Payer: Self-pay | Admitting: Nurse Practitioner

## 2019-05-25 DIAGNOSIS — N3000 Acute cystitis without hematuria: Secondary | ICD-10-CM | POA: Diagnosis not present

## 2019-05-25 MED ORDER — SULFAMETHOXAZOLE-TRIMETHOPRIM 800-160 MG PO TABS: 1.0000 | ORAL_TABLET | Freq: Two times a day (BID) | ORAL | 0 refills | Status: DC

## 2019-05-25 NOTE — Progress Notes (Signed)
   Virtual Visit via telephone Note Due to COVID-19 pandemic this visit was conducted virtually. This visit type was conducted due to national recommendations for restrictions regarding the COVID-19 Pandemic (e.g. social distancing, sheltering in place) in an effort to limit this patient's exposure and mitigate transmission in our community. All issues noted in this document were discussed and addressed.  A physical exam was not performed with this format.  I connected with [name] on 05/25/19 at 1:45 by telephone and verified that I am speaking with the correct person using two identifiers. [name] is currently located at home and her husband is currently with her during visit. The provider, Mary-Margaret Hassell Done, FNP is located in their office at time of visit.  I discussed the limitations, risks, security and privacy concerns of performing an evaluation and management service by telephone and the availability of in person appointments. I also discussed with the patient that there may be a patient responsible charge related to this service. The patient expressed understanding and agreed to proceed.   History and Present Illness:   Chief Complaint: Urinary Tract Infection   HPI Patient calls in today for an appointment c/o dysuria and urinary frequency that started Saturday. Slight suprpubic pain.   Review of Systems  Genitourinary: Positive for dysuria, frequency and urgency. Negative for flank pain and hematuria.  Musculoskeletal: Positive for back pain.  All other systems reviewed and are negative.    Observations/Objective: Alert and oriented- answers all questions appropriately mild distress   Assessment and Plan: [name] in today with chief complaint of Urinary Tract Infection   1. Acute cystitis without hematuria Take medication as prescribe Cotton underwear Take shower not bath Cranberry juice, yogurt Force fluids AZO over the counter X2 days  Culture pending RTO prn  - sulfamethoxazole-trimethoprim (BACTRIM DS) 800-160 MG tablet; Take 1 tablet by mouth 2 (two) times daily.  Dispense: 10 tablet; Refill: 0 - Urine Culture   Follow Up Instructions: prn    I discussed the assessment and treatment plan with the patient. The patient was provided an opportunity to ask questions and all were answered. The patient agreed with the plan and demonstrated an understanding of the instructions.   The patient was advised to call back or seek an in-person evaluation if the symptoms worsen or if the condition fails to improve as anticipated.  The above assessment and management plan was discussed with the patient. The patient verbalized understanding of and has agreed to the management plan. Patient is aware to call the clinic if symptoms persist or worsen. Patient is aware when to return to the clinic for a follow-up visit. Patient educated on when it is appropriate to go to the emergency department.   Time call ended:  1:55  I provided 10 minutes of non-face-to-face time during this encounter.    Mary-Margaret Hassell Done, FNP

## 2019-05-26 ENCOUNTER — Other Ambulatory Visit: Payer: Self-pay

## 2019-05-26 ENCOUNTER — Telehealth: Payer: Self-pay | Admitting: Internal Medicine

## 2019-05-26 ENCOUNTER — Inpatient Hospital Stay: Payer: Medicare Other | Attending: Internal Medicine | Admitting: Internal Medicine

## 2019-05-26 ENCOUNTER — Inpatient Hospital Stay: Payer: Medicare Other

## 2019-05-26 ENCOUNTER — Encounter: Payer: Self-pay | Admitting: Internal Medicine

## 2019-05-26 VITALS — BP 125/111 | HR 58 | Temp 98.2°F | Resp 18 | Ht 64.0 in | Wt 208.4 lb

## 2019-05-26 DIAGNOSIS — D696 Thrombocytopenia, unspecified: Secondary | ICD-10-CM | POA: Diagnosis not present

## 2019-05-26 DIAGNOSIS — C7A090 Malignant carcinoid tumor of the bronchus and lung: Secondary | ICD-10-CM | POA: Insufficient documentation

## 2019-05-26 DIAGNOSIS — C7A8 Other malignant neuroendocrine tumors: Secondary | ICD-10-CM | POA: Insufficient documentation

## 2019-05-26 DIAGNOSIS — Z23 Encounter for immunization: Secondary | ICD-10-CM | POA: Diagnosis not present

## 2019-05-26 DIAGNOSIS — D61818 Other pancytopenia: Secondary | ICD-10-CM | POA: Insufficient documentation

## 2019-05-26 DIAGNOSIS — C7B8 Other secondary neuroendocrine tumors: Secondary | ICD-10-CM | POA: Diagnosis not present

## 2019-05-26 DIAGNOSIS — Z86711 Personal history of pulmonary embolism: Secondary | ICD-10-CM | POA: Diagnosis not present

## 2019-05-26 DIAGNOSIS — Z7901 Long term (current) use of anticoagulants: Secondary | ICD-10-CM | POA: Insufficient documentation

## 2019-05-26 DIAGNOSIS — C3492 Malignant neoplasm of unspecified part of left bronchus or lung: Secondary | ICD-10-CM

## 2019-05-26 DIAGNOSIS — C787 Secondary malignant neoplasm of liver and intrahepatic bile duct: Secondary | ICD-10-CM | POA: Insufficient documentation

## 2019-05-26 DIAGNOSIS — Z79899 Other long term (current) drug therapy: Secondary | ICD-10-CM | POA: Insufficient documentation

## 2019-05-26 LAB — CBC WITH DIFFERENTIAL (CANCER CENTER ONLY)
Abs Immature Granulocytes: 0.01 10*3/uL (ref 0.00–0.07)
Basophils Absolute: 0 10*3/uL (ref 0.0–0.1)
Basophils Relative: 1 %
Eosinophils Absolute: 0.2 10*3/uL (ref 0.0–0.5)
Eosinophils Relative: 5 %
HCT: 30.7 % — ABNORMAL LOW (ref 36.0–46.0)
Hemoglobin: 10.1 g/dL — ABNORMAL LOW (ref 12.0–15.0)
Immature Granulocytes: 0 %
Lymphocytes Relative: 23 %
Lymphs Abs: 0.7 10*3/uL (ref 0.7–4.0)
MCH: 32.7 pg (ref 26.0–34.0)
MCHC: 32.9 g/dL (ref 30.0–36.0)
MCV: 99.4 fL (ref 80.0–100.0)
Monocytes Absolute: 0.6 10*3/uL (ref 0.1–1.0)
Monocytes Relative: 19 %
Neutro Abs: 1.7 10*3/uL (ref 1.7–7.7)
Neutrophils Relative %: 52 %
Platelet Count: 80 10*3/uL — ABNORMAL LOW (ref 150–400)
RBC: 3.09 MIL/uL — ABNORMAL LOW (ref 3.87–5.11)
RDW: 18.6 % — ABNORMAL HIGH (ref 11.5–15.5)
WBC Count: 3.2 10*3/uL — ABNORMAL LOW (ref 4.0–10.5)
nRBC: 0 % (ref 0.0–0.2)

## 2019-05-26 LAB — CMP (CANCER CENTER ONLY)
ALT: 11 U/L (ref 0–44)
AST: 17 U/L (ref 15–41)
Albumin: 3.5 g/dL (ref 3.5–5.0)
Alkaline Phosphatase: 117 U/L (ref 38–126)
Anion gap: 9 (ref 5–15)
BUN: 14 mg/dL (ref 8–23)
CO2: 23 mmol/L (ref 22–32)
Calcium: 9.1 mg/dL (ref 8.9–10.3)
Chloride: 97 mmol/L — ABNORMAL LOW (ref 98–111)
Creatinine: 0.96 mg/dL (ref 0.44–1.00)
GFR, Est AFR Am: >60 mL/min (ref >60)
GFR, Estimated: 55 mL/min — ABNORMAL LOW (ref >60)
Glucose, Bld: 122 mg/dL — ABNORMAL HIGH (ref 70–99)
Potassium: 3.9 mmol/L (ref 3.5–5.1)
Sodium: 129 mmol/L — ABNORMAL LOW (ref 135–145)
Total Bilirubin: 0.5 mg/dL (ref 0.3–1.2)
Total Protein: 6.2 g/dL — ABNORMAL LOW (ref 6.5–8.1)

## 2019-05-26 MED ORDER — INFLUENZA VAC A&B SA ADJ QUAD 0.5 ML IM PRSY
0.5000 mL | PREFILLED_SYRINGE | Freq: Once | INTRAMUSCULAR | Status: AC
  Administered 2019-05-26: 0.5 mL via INTRAMUSCULAR

## 2019-05-26 MED ORDER — INFLUENZA VAC A&B SA ADJ QUAD 0.5 ML IM PRSY
PREFILLED_SYRINGE | INTRAMUSCULAR | Status: AC
  Filled 2019-05-26: qty 0.5

## 2019-05-26 MED FILL — CAPECITABINE 500 MG TABS: 500 | 14 days supply | Qty: 84 | Fill #2

## 2019-05-26 MED FILL — TEMOZOLOMIDE 140 MG CAPS: 140 | 28 days supply | Qty: 10 | Fill #2

## 2019-05-26 NOTE — Telephone Encounter (Signed)
Schedule per los. Gave avs and calendar

## 2019-05-26 NOTE — Progress Notes (Signed)
[facility] [phone]   Fax:([phone]) [place], [place] [address]  DIAGNOSIS:  1) Metastatic intermediate. Neuroendocrine tumor of lung primary diagnosed in January 2018 and presented with small bilateral pulmonary nodules in addition to multiple liver metastasis. 2) right lower lobe pulmonary embolism diagnosed incidentally on CT scan of the chest on 04/23/2017  PRIOR THERAPY:  1) Status post radio embolization with Y 90 to the liver lesions by interventional radiology. 2) status post IVC filter placement by interventional radiology on 04/24/2017  CURRENT THERAPY: Xeloda 750 MG/M2 twice a day days 1-14 and Temodar 150 MG/M2 days 10-14 every 4 weeks. Status post 37 cycles.  She will start cycle #38 on May 31, 2019.  INTERVAL HISTORY: [name] 85 y.o. female returns to the clinic today for follow-up visit accompanied by her husband.  The patient is feeling fine today with no concerning complaints except for occasional pain in the right upper quadrant of the abdomen.  She also has shortness of breath with exertion and fatigue.  She denied having any chest pain, cough or hemoptysis.  She denied having any fever or chills.  She has no nausea, vomiting, diarrhea or constipation.  She denied having any headache or visual changes.  She has no recent weight loss or night sweats.  She has been tolerating her treatment with Xeloda and Temodar fairly well.  She is expected to start cycle #38 on May 31, 2019.  MEDICAL HISTORY: Past Medical History:  Diagnosis Date  . Acute respiratory failure with hypoxia ([facility]) 10/23/2015  . Allergic rhinitis    PT. DENIES  . Anxiety   . Aortic insufficiency    Echo 04/29/2018: EF 65-70, mild AS (mean 13), mod AI, Asc Aorta 42 mm (mildly dilated), mild LAE, PASP 41, pericardium normal in appearance.   . Arthritis    NECK  . Ataxia   . Back pain 12/13/2016  .  Bradycardia    primarily nocturnal  . Burning tongue syndrome 25 years  . Cancer ([facility]) dx'd 06/2016   liver  . Cataract   . Cerebellar degeneration ([facility])   . Chest pain 12/13/2016   Atypical chest pain  . Chronic urinary tract infection   . Complication of anesthesia    low o2 sats, coded 30 years ago  . CVA (cerebral infarction) 05/2003  . Dehydration with hyponatremia 03/23/2018  . Depression   . Encounter for antineoplastic chemotherapy 10/09/2016  . Fatigue 01/03/2015  . Gait disorder   . Gastric polyps   . GERD (gastroesophageal reflux disease)   . Goals of care, counseling/discussion 10/09/2016  . H/O: CVA (cerebrovascular accident) 07/14/2016  . High cholesterol   . History of pericarditis   . Hyperlipidemia   . Hypotension   . Hypothyroidism   . IBS (irritable bowel syndrome)   . Neuroendocrine cancer ([facility]) 08/23/2016  . Obesity   . Paroxysmal atrial fibrillation ([name])    chads2vasc score is 6,  she is felt to be a poor candidate for anticoagulation  . Pericarditis   . Personal history of arterial venous malformation (AVM)    right side of face  . Seizure disorder ([facility])   . Seizures ([facility]) 2003   " smelling"- Gabapentin "no problem"  . Shortness of breath dyspnea    with exertion  . Sick sinus syndrome ([facility])   . Sternum fx 10/27/2013  . Stroke [facility]) 5 years ago  Right side of face weak, slurred speach-   . Thyroid disease   . TIA (transient ischemic attack)   . UTI (lower urinary tract infection) 03/27/2016   "frequently"    ALLERGIES:  is allergic to levaquin [levofloxacin] and lisinopril.  MEDICATIONS:  Current Outpatient Medications  Medication Sig Dispense Refill  . amitriptyline (ELAVIL) 25 MG tablet TAKE 1 TABLET AT BEDTIME 90 tablet 3  . amLODipine (NORVASC) 2.5 MG tablet TAKE 1 TABLET DAILY 90 tablet 1  . capecitabine (XELODA) 500 MG tablet TAKE 3 TABLETS (1500MG ) BY MOUTH TWICE DAILY AFTER A MEAL. TAKE ON DAYS 1-14 OF EACH 28 DAY CYCLE 84 tablet 2  .  escitalopram (LEXAPRO) 20 MG tablet TAKE 1 TABLET AT BEDTIME 90 tablet 3  . fluticasone (FLONASE) 50 MCG/ACT nasal spray Place 2 sprays into both nostrils at bedtime. 48 g 3  . furosemide (LASIX) 20 MG tablet TAKE 1 TABLET DAILY AS NEEDED FOR FLUID RETENTION 90 tablet 1  . gabapentin (NEURONTIN) 400 MG capsule TAKE 1 TO 2 CAPSULES THREE TIMES A DAY 720 capsule 1  . hydroxypropyl methylcellulose / hypromellose (ISOPTO TEARS / GONIOVISC) 2.5 % ophthalmic solution Place 1 drop into both eyes daily.     Marland Kitchen levothyroxine (SYNTHROID) 75 MCG tablet Take 1 tablet (75 mcg total) by mouth daily before breakfast. 90 tablet 1  . lovastatin (MEVACOR) 40 MG tablet Take 1 tablet (40 mg total) by mouth at bedtime. 90 tablet 1  . Melatonin 3 MG TBDP Take 3-6 mg by mouth at bedtime as needed. 60 tablet 1  . omeprazole (PRILOSEC) 40 MG capsule Take 1 capsule (40 mg total) by mouth daily. 90 capsule 1  . ondansetron (ZOFRAN) 8 MG tablet Take 1 tablet (8 mg total) by mouth every 8 (eight) hours as needed for nausea or vomiting. 90 tablet 1  . OXcarbazepine (TRILEPTAL) 150 MG tablet Take 1 tablet (150 mg total) by mouth 2 (two) times daily. 180 tablet 3  . rivaroxaban (XARELTO) 20 MG TABS tablet TAKE 1 TABLET DAILY WITH SUPPER 90 tablet 3  . sulfamethoxazole-trimethoprim (BACTRIM DS) 800-160 MG tablet Take 1 tablet by mouth 2 (two) times daily. 10 tablet 0  . temozolomide (TEMODAR) 140 MG capsule TAKE 2 CAPSULES (280MG ) BY MOUTH DAILY FOR 5 DAYS,ON DAYS 10-14 OF EACH 28DAY CYCLE. MAY TAKE ON EMPTY STOMACH AT BEDTIME TO DECREASE NAUSEA 10 capsule 2   No current facility-administered medications for this visit.     SURGICAL HISTORY:  Past Surgical History:  Procedure Laterality Date  . APPENDECTOMY  85 years old  . COLONOSCOPY  2006, 2009  . COLONOSCOPY WITH PROPOFOL N/A 03/28/2016   Procedure: COLONOSCOPY WITH PROPOFOL;  Surgeon: Gatha Mayer, MD;  Location: [facility];  Service: Endoscopy;  Laterality: N/A;  .  CORONARY ANGIOGRAPHY N/A 05/22/2018   Procedure: CORONARY ANGIOGRAPHY (CATH LAB);  Surgeon: Lorretta Harp, MD;  Location: [facility];  Service: Cardiovascular;  Laterality: N/A;  . cyst removed  35 years ago  . EP IMPLANTABLE DEVICE N/A 01/06/2015   Procedure: Loop Recorder Insertion;  Surgeon: Thompson Grayer, MD;  Location: [facility];  Service: Cardiovascular;  Laterality: N/A;  . EYE SURGERY Right    Cataract  . IR ANGIOGRAM SELECTIVE EACH ADDITIONAL VESSEL  11/28/2016  . IR ANGIOGRAM SELECTIVE EACH ADDITIONAL VESSEL  11/28/2016  . IR ANGIOGRAM SELECTIVE EACH ADDITIONAL VESSEL  11/28/2016  . IR ANGIOGRAM SELECTIVE EACH ADDITIONAL VESSEL  11/28/2016  . IR ANGIOGRAM SELECTIVE  EACH ADDITIONAL VESSEL  12/13/2016  . IR ANGIOGRAM SELECTIVE EACH ADDITIONAL VESSEL  12/13/2016  . IR ANGIOGRAM SELECTIVE EACH ADDITIONAL VESSEL  01/09/2017  . IR ANGIOGRAM VISCERAL SELECTIVE  11/28/2016  . IR ANGIOGRAM VISCERAL SELECTIVE  11/28/2016  . IR ANGIOGRAM VISCERAL SELECTIVE  12/13/2016  . IR ANGIOGRAM VISCERAL SELECTIVE  12/13/2016  . IR ANGIOGRAM VISCERAL SELECTIVE  01/09/2017  . IR EMBO ARTERIAL NOT HEMORR HEMANG INC GUIDE ROADMAPPING  11/28/2016  . IR EMBO TUMOR ORGAN ISCHEMIA INFARCT INC GUIDE ROADMAPPING  12/13/2016  . IR EMBO TUMOR ORGAN ISCHEMIA INFARCT INC GUIDE ROADMAPPING  01/09/2017  . IR IVC FILTER PLMT / S&I /IMG GUID/MOD SED  04/24/2017  . IR RADIOLOGIST EVAL & MGMT  11/06/2016  . IR RADIOLOGIST EVAL & MGMT  01/02/2017  . IR RADIOLOGIST EVAL & MGMT  02/05/2017  . IR RADIOLOGIST EVAL & MGMT  05/02/2017  . IR RADIOLOGIST EVAL & MGMT  10/17/2017  . IR US GUIDE VASC ACCESS RIGHT  11/28/2016  . IR US GUIDE VASC ACCESS RIGHT  12/13/2016  . IR US GUIDE VASC ACCESS RIGHT  01/09/2017  . KNEE ARTHROSCOPY Right 11/14/2006  . KNEE ARTHROSCOPY Bilateral 5 and 6 years ago  . KNEE ARTHROSCOPY WITH LATERAL MENISECTOMY  07/03/2012   Procedure: KNEE ARTHROSCOPY WITH LATERAL MENISECTOMY;  Surgeon: Magnus Sinning, MD;   Location: [facility];  Service: Orthopedics;  Laterality: Left;  with Partial Lateral Menisectomy and Medial Menisectomy. Shaving of medial and lateral femoral condyles. Shaving of patella. Removal of a loose body  . LOOP RECORDER REMOVAL N/A 05/22/2018   Procedure: LOOP RECORDER REMOVAL;  Surgeon: Thompson Grayer, MD;  Location: [facility];  Service: Cardiovascular;  Laterality: N/A;  . PACEMAKER IMPLANT N/A 05/22/2018   MDT Azure XT DR MRI implanted by Dr Rayann Heman for sick sinus syndrome  . tibial and fibular internal fixation Left   . TOTAL ABDOMINAL HYSTERECTOMY  85 years old  . UPPER GASTROINTESTINAL ENDOSCOPY  2009, 2013    REVIEW OF SYSTEMS:  A comprehensive review of systems was negative except for: Constitutional: positive for anorexia and fatigue Respiratory: positive for dyspnea on exertion Musculoskeletal: positive for muscle weakness   PHYSICAL EXAMINATION: General appearance: alert, cooperative, fatigued and no distress Head: Normocephalic, without obvious abnormality, atraumatic Neck: no adenopathy, no JVD, supple, symmetrical, trachea midline and thyroid not enlarged, symmetric, no tenderness/mass/nodules Lymph nodes: Cervical, supraclavicular, and axillary nodes normal. Resp: clear to auscultation bilaterally Back: symmetric, no curvature. ROM normal. No CVA tenderness. Cardio: regular rate and rhythm, S1, S2 normal, no murmur, click, rub or gallop GI: soft, non-tender; bowel sounds normal; no masses,  no organomegaly Extremities: extremities normal, atraumatic, no cyanosis or edema  ECOG PERFORMANCE STATUS: 1 - Symptomatic but completely ambulatory  Blood pressure (!) 125/111, pulse (!) 58, temperature 98.2 F (36.8 C), temperature source Temporal, resp. rate 18, height 5\' 4"  (1.626 m), weight 208 lb 6.4 oz (94.5 kg), SpO2 100 %.  LABORATORY DATA: Lab Results  Component Value Date   WBC 3.2 (L) 05/26/2019   HGB 10.1 (L) 05/26/2019   HCT 30.7 (L) 05/26/2019   MCV  99.4 05/26/2019   PLT 80 (L) 05/26/2019      Chemistry      Component Value Date/Time   NA 133 (L) 04/10/2019 0906   NA 140 10/10/2018 1548   NA 135 (L) 07/10/2017 1142   K 5.1 04/10/2019 0906   K 4.6 07/10/2017 1142   CL 99 04/10/2019 0906  CO2 27 04/10/2019 0906   CO2 25 07/10/2017 1142   BUN 10 04/10/2019 0906   BUN 14 10/10/2018 1548   BUN 11.4 07/10/2017 1142   CREATININE 0.89 04/10/2019 0906   CREATININE 1.0 07/10/2017 1142      Component Value Date/Time   CALCIUM 9.8 04/10/2019 0906   CALCIUM 9.9 07/10/2017 1142   ALKPHOS 106 04/10/2019 0906   ALKPHOS 93 07/10/2017 1142   AST 23 04/10/2019 0906   AST 43 (H) 07/10/2017 1142   ALT 11 04/10/2019 0906   ALT 28 07/10/2017 1142   BILITOT 0.5 04/10/2019 0906   BILITOT 0.56 07/10/2017 1142       RADIOGRAPHIC STUDIES: No results found.  ASSESSMENT AND PLAN:  This is a very pleasant 85  years old white female with metastatic intermediate grade neuroendocrine carcinoma of questionable lung primary and multiple metastatic liver lesions and pancreatic lesions.She status post treatment with radio embolization with Y90 to the left and right lobe liver lesions.Status post treatment with Y 90 to the left and right lobes of the liver. She is currently undergoing systemic chemotherapy with Xeloda and Temodar status post 37 cycles.  She will start cycle #38 on May 31, 2019. The patient has been tolerating this treatment well with no concerning complaints.  She continues to have right upper quadrant pain which could be concerning for her disease in the liver. I recommended for the patient to continue on her current treatment with Xeloda and Temodar. I will schedule her for repeat CT scan of the chest, abdomen pelvis in 6 weeks before her next visit. The patient was advised to call immediately if she has any other concerning symptoms in the interval. The patient voices understanding of current disease status and treatment options  and is in agreement with the current care plan. All questions were answered. The patient knows to call the clinic with any problems, questions or concerns. We can certainly see the patient much sooner if necessary.  Disclaimer: This note was dictated with voice recognition software. Similar sounding words can inadvertently be transcribed and may not be corrected upon review.

## 2019-05-27 LAB — URINE CULTURE

## 2019-06-01 ENCOUNTER — Ambulatory Visit (INDEPENDENT_AMBULATORY_CARE_PROVIDER_SITE_OTHER): Payer: Medicare Other | Admitting: *Deleted

## 2019-06-01 DIAGNOSIS — I480 Paroxysmal atrial fibrillation: Secondary | ICD-10-CM

## 2019-06-01 DIAGNOSIS — I495 Sick sinus syndrome: Secondary | ICD-10-CM

## 2019-06-01 LAB — CUP PACEART REMOTE DEVICE CHECK
Battery Remaining Longevity: 152 mo
Battery Voltage: 3.06 V
Brady Statistic AP VP Percent: 0.24 %
Brady Statistic AP VS Percent: 94.33 %
Brady Statistic AS VP Percent: 0 %
Brady Statistic AS VS Percent: 5.43 %
Brady Statistic RA Percent Paced: 94.48 %
Brady Statistic RV Percent Paced: 0.24 %
Date Time Interrogation Session: 20201102081403
Implantable Lead Implant Date: 20191024
Implantable Lead Implant Date: 20191024
Implantable Lead Location: 753859
Implantable Lead Location: 753860
Implantable Lead Model: 5076
Implantable Lead Model: 5076
Implantable Pulse Generator Implant Date: 20191024
Lead Channel Impedance Value: 323 Ohm
Lead Channel Impedance Value: 323 Ohm
Lead Channel Impedance Value: 361 Ohm
Lead Channel Impedance Value: 380 Ohm
Lead Channel Pacing Threshold Amplitude: 0.5 V
Lead Channel Pacing Threshold Amplitude: 1.125 V
Lead Channel Pacing Threshold Pulse Width: 0.4 ms
Lead Channel Pacing Threshold Pulse Width: 0.4 ms
Lead Channel Sensing Intrinsic Amplitude: 13.375 mV
Lead Channel Sensing Intrinsic Amplitude: 13.375 mV
Lead Channel Sensing Intrinsic Amplitude: 2.125 mV
Lead Channel Sensing Intrinsic Amplitude: 2.125 mV
Lead Channel Setting Pacing Amplitude: 1.5 V
Lead Channel Setting Pacing Amplitude: 2.25 V
Lead Channel Setting Pacing Pulse Width: 0.4 ms
Lead Channel Setting Sensing Sensitivity: 1.2 mV

## 2019-06-07 DIAGNOSIS — S51011A Laceration without foreign body of right elbow, initial encounter: Secondary | ICD-10-CM | POA: Diagnosis not present

## 2019-06-09 ENCOUNTER — Other Ambulatory Visit: Payer: Self-pay

## 2019-06-09 ENCOUNTER — Encounter: Payer: Self-pay | Admitting: Family

## 2019-06-09 ENCOUNTER — Ambulatory Visit (INDEPENDENT_AMBULATORY_CARE_PROVIDER_SITE_OTHER): Payer: Medicare Other | Admitting: Family

## 2019-06-09 VITALS — BP 120/56 | HR 67 | Temp 97.5°F | Ht 64.0 in | Wt 223.0 lb

## 2019-06-09 DIAGNOSIS — C3492 Malignant neoplasm of unspecified part of left bronchus or lung: Secondary | ICD-10-CM | POA: Diagnosis not present

## 2019-06-09 DIAGNOSIS — Z86711 Personal history of pulmonary embolism: Secondary | ICD-10-CM | POA: Diagnosis not present

## 2019-06-09 DIAGNOSIS — S81811S Laceration without foreign body, right lower leg, sequela: Secondary | ICD-10-CM | POA: Diagnosis not present

## 2019-06-09 DIAGNOSIS — Z789 Other specified health status: Secondary | ICD-10-CM

## 2019-06-09 DIAGNOSIS — Z7901 Long term (current) use of anticoagulants: Secondary | ICD-10-CM

## 2019-06-09 DIAGNOSIS — C7B8 Other secondary neuroendocrine tumors: Secondary | ICD-10-CM

## 2019-06-09 DIAGNOSIS — C7A8 Other malignant neuroendocrine tumors: Secondary | ICD-10-CM | POA: Diagnosis not present

## 2019-06-09 LAB — HEMOGLOBIN, FINGERSTICK: Hemoglobin: 10.3 g/dL — ABNORMAL LOW (ref 11.1–15.9)

## 2019-06-09 NOTE — Progress Notes (Addendum)
Subjective:    Patient ID: [name], female    DOB: 02/02/1934, 85 y.o.   MRN: [id number]  Chief Complaint  Patient presents with  . right leg laceration    was seen at Urgent care on saturday for treatment    HPI Pt presents to the office today with lower leg laceration that occurred Friday evening after hitting her wheelchair. Her husband states they went to the Urgent Care on 06/07/19 and they were able to stop the bleeding and placed a pressure dressing on patient. Her husband states her leg started bleeding again.   Her platelets were 80  And her hgb was 10.1 on 05/26/19. She is currently taking xarelto for hx of PE's. She has lung cancer.    Review of Systems  Constitutional: Positive for fatigue.  Skin: Positive for wound.  Neurological: Positive for weakness.  All other systems reviewed and are negative.      Objective:   Physical Exam Vitals signs reviewed.  Constitutional:      General: She is not in acute distress.    Appearance: She is well-developed.  HENT:     Head: Normocephalic and atraumatic.  Eyes:     Pupils: Pupils are equal, round, and reactive to light.  Neck:     Musculoskeletal: Normal range of motion and neck supple.     Thyroid: No thyromegaly.  Cardiovascular:     Rate and Rhythm: Normal rate and regular rhythm.     Heart sounds: Normal heart sounds. No murmur.  Pulmonary:     Effort: Pulmonary effort is normal. No respiratory distress.     Breath sounds: Normal breath sounds. No wheezing.  Abdominal:     General: Bowel sounds are normal. There is no distension.     Palpations: Abdomen is soft.     Tenderness: There is no abdominal tenderness.  Musculoskeletal:        General: No tenderness.  Skin:    General: Skin is warm and dry.     Findings: Laceration present.          Comments: Large weeping skin tear, no fever, discharge  Neurological:     Mental Status: She is alert and oriented to person, place, and time.   Cranial Nerves: No cranial nerve deficit.     Motor: Weakness present.     Deep Tendon Reflexes: Reflexes are normal and symmetric.     Comments: In wheelchair  Psychiatric:        Behavior: Behavior normal.        Thought Content: Thought content normal.        Judgment: Judgment normal.     BP (!) 120/56   Pulse 67   Temp (!) 97.5 F (36.4 C) (Temporal)   Ht 5\' 4"  (1.626 m)   Wt 223 lb (101.2 kg)   SpO2 95%   BMI 38.28 kg/m    Electrocautery used to try to stop bleeding on wound of right lower leg. Wound approx 5X6 cm, weeping blood, pressure dressing applied. Will have patient come tomorrow for cautery again if still bleeding.     Assessment & Plan:  [name] comes in today with chief complaint of right leg laceration (was seen at Urgent care on saturday for treatment)   Diagnosis and orders addressed:  1. Noninfected skin tear of leg, right, sequela - Fingerstick Hemoglobin  2. Anticoagulated  3. Active bleeding  4. History of pulmonary embolism  5. Neuroendocrine carcinoma metastatic to  liver ([facility] [place])  6. Primary malignant neoplasm of left lung metastatic to other site ([name])   Hgb stable  Pressure dressing applied Will return to the office tomorrow for electrocautery again to hopefully stop the bleeding If bleeding worsen or patient feels SOB, increase weakness go to ED. Keep follow up with Oncologists   Evelina Dun, FNP

## 2019-06-10 ENCOUNTER — Ambulatory Visit (INDEPENDENT_AMBULATORY_CARE_PROVIDER_SITE_OTHER): Payer: Medicare Other | Admitting: Family Medicine

## 2019-06-10 ENCOUNTER — Encounter: Payer: Self-pay | Admitting: Family Medicine

## 2019-06-10 VITALS — BP 114/58 | HR 59 | Temp 98.6°F

## 2019-06-10 DIAGNOSIS — S81811S Laceration without foreign body, right lower leg, sequela: Secondary | ICD-10-CM

## 2019-06-10 DIAGNOSIS — Z7901 Long term (current) use of anticoagulants: Secondary | ICD-10-CM

## 2019-06-10 DIAGNOSIS — Z789 Other specified health status: Secondary | ICD-10-CM

## 2019-06-10 NOTE — Progress Notes (Signed)
BP (!) 114/58   Pulse (!) 59   Temp 98.6 F (37 C) (Temporal)    Subjective:   Patient ID: [name], female    DOB: 08/13/1933, 85 y.o.   MRN: [id number]  HPI: [name] is a 85 y.o. female presenting on 06/10/2019 for right leg laceration (1 day re check )   HPI Patient is coming in for a quick recheck on her right leg laceration/skin tear that she sustained by hitting her wheelchair on 06/07/2019, she was seen yesterday and did cauterization and it does seem to be bleeding much less and only slightly oozing compared to what it was before.  Relevant past medical, surgical, family and social history reviewed and updated as indicated. Interim medical history since our last visit reviewed. Allergies and medications reviewed and updated.  Review of Systems  Constitutional: Negative for chills and fever.  Eyes: Negative for visual disturbance.  Respiratory: Negative for chest tightness and shortness of breath.   Cardiovascular: Negative for chest pain and leg swelling.  Musculoskeletal: Negative for back pain and gait problem.  Skin: Positive for wound. Negative for color change and rash.  Neurological: Negative for light-headedness and headaches.  Psychiatric/Behavioral: Negative for agitation and behavioral problems.  All other systems reviewed and are negative.   Per HPI unless specifically indicated above   Allergies as of 06/10/2019      Reactions   Levaquin [levofloxacin] Shortness Of Breath   Lisinopril Cough      Medication List       Accurate as of June 10, 2019 11:07 AM. If you have any questions, ask your nurse or doctor.        amitriptyline 25 MG tablet Commonly known as: ELAVIL TAKE 1 TABLET AT BEDTIME   amLODipine 2.5 MG tablet Commonly known as: NORVASC TAKE 1 TABLET DAILY   capecitabine 500 MG tablet Commonly known as: XELODA TAKE 3 TABLETS (1500MG ) BY MOUTH TWICE DAILY AFTER A MEAL. TAKE ON DAYS 1-14 OF EACH 28 DAY CYCLE    cephALEXin 500 MG capsule Commonly known as: KEFLEX Take by mouth.   escitalopram 20 MG tablet Commonly known as: LEXAPRO TAKE 1 TABLET AT BEDTIME   fluticasone 50 MCG/ACT nasal spray Commonly known as: FLONASE Place 2 sprays into both nostrils at bedtime.   furosemide 20 MG tablet Commonly known as: LASIX TAKE 1 TABLET DAILY AS NEEDED FOR FLUID RETENTION   gabapentin 400 MG capsule Commonly known as: NEURONTIN TAKE 1 TO 2 CAPSULES THREE TIMES A DAY   hydroxypropyl methylcellulose / hypromellose 2.5 % ophthalmic solution Commonly known as: ISOPTO TEARS / GONIOVISC Place 1 drop into both eyes daily.   levothyroxine 75 MCG tablet Commonly known as: SYNTHROID Take 1 tablet (75 mcg total) by mouth daily before breakfast.   lovastatin 40 MG tablet Commonly known as: MEVACOR Take 1 tablet (40 mg total) by mouth at bedtime.   Melatonin 3 MG Tbdp Take 3-6 mg by mouth at bedtime as needed.   omeprazole 40 MG capsule Commonly known as: PRILOSEC Take 1 capsule (40 mg total) by mouth daily.   ondansetron 8 MG tablet Commonly known as: ZOFRAN Take 1 tablet (8 mg total) by mouth every 8 (eight) hours as needed for nausea or vomiting.   OXcarbazepine 150 MG tablet Commonly known as: TRILEPTAL Take 1 tablet (150 mg total) by mouth 2 (two) times daily.   phenazopyridine 200 MG tablet Commonly known as: PYRIDIUM Take 1 tablet by mouth 3 (three)  times daily.   rivaroxaban 20 MG Tabs tablet Commonly known as: Xarelto TAKE 1 TABLET DAILY WITH SUPPER   temozolomide 140 MG capsule Commonly known as: TEMODAR TAKE 2 CAPSULES (280MG ) BY MOUTH DAILY FOR 5 DAYS,ON DAYS 10-14 OF EACH 28DAY CYCLE. MAY TAKE ON EMPTY STOMACH AT BEDTIME TO DECREASE NAUSEA        Objective:   BP (!) 114/58   Pulse (!) 59   Temp 98.6 F (37 C) (Temporal)   Wt Readings from Last 3 Encounters:  06/09/19 223 lb (101.2 kg)  05/26/19 208 lb 6.4 oz (94.5 kg)  04/28/19 217 lb (98.4 kg)    Physical  Exam Vitals signs and nursing note reviewed.  Constitutional:      General: She is not in acute distress.    Appearance: She is well-developed. She is not diaphoretic.  Eyes:     Conjunctiva/sclera: Conjunctivae normal.  Skin:    General: Skin is warm and dry.     Findings: No rash.       Neurological:     Mental Status: She is alert and oriented to person, place, and time.     Coordination: Coordination normal.  Psychiatric:        Behavior: Behavior normal.     Results for orders placed or performed in visit on 06/09/19  Fingerstick Hemoglobin  Result Value Ref Range   Hemoglobin 10.3 (L) 11.1 - 15.9 g/dL    Assessment & Plan:   Problem List Items Addressed This Visit    None    Visit Diagnoses    Noninfected skin tear of leg, right, sequela    -  Primary   Anticoagulated       Active bleeding        Significantly reduced bleeding from yesterday, will apply 1 more compression dressing and have her return in a day or 2 to have it relooked at and redressed.  She does still has 1 small spot that is slightly oozing.  Her hemoglobin looks good from last time  Follow up plan: Return in about 2 days (around 06/12/2019), or if symptoms worsen or fail to improve, for Return in 2 days for recheck.  Counseling provided for all of the vaccine components No orders of the defined types were placed in this encounter.   Caryl Pina, MD [facility] 06/10/2019, 11:07 AM

## 2019-06-11 ENCOUNTER — Other Ambulatory Visit: Payer: Self-pay

## 2019-06-12 ENCOUNTER — Encounter: Payer: Self-pay | Admitting: Family Medicine

## 2019-06-12 ENCOUNTER — Ambulatory Visit (INDEPENDENT_AMBULATORY_CARE_PROVIDER_SITE_OTHER): Payer: Medicare Other | Admitting: Family Medicine

## 2019-06-12 VITALS — BP 112/53 | HR 80 | Temp 97.5°F | Resp 20

## 2019-06-12 DIAGNOSIS — S81811S Laceration without foreign body, right lower leg, sequela: Secondary | ICD-10-CM

## 2019-06-12 NOTE — Patient Instructions (Signed)
Wound Care, Adult Taking care of your wound properly can help to prevent pain, infection, and scarring. It can also help your wound to heal more quickly. How to care for your wound Wound care      Follow instructions from your health care provider about how to take care of your wound. Make sure you: ? Wash your hands with soap and water before you change the bandage (dressing). If soap and water are not available, use hand sanitizer. ? Change your dressing as told by your health care provider. ? Leave stitches (sutures), skin glue, or adhesive strips in place. These skin closures may need to stay in place for 2 weeks or longer. If adhesive strip edges start to loosen and curl up, you may trim the loose edges. Do not remove adhesive strips completely unless your health care provider tells you to do that.  Check your wound area every day for signs of infection. Check for: ? Redness, swelling, or pain. ? Fluid or blood. ? Warmth. ? Pus or a bad smell.  Ask your health care provider if you should clean the wound with mild soap and water. Doing this may include: ? Using a clean towel to pat the wound dry after cleaning it. Do not rub or scrub the wound. ? Applying a cream or ointment. Do this only as told by your health care provider. ? Covering the incision with a clean dressing.  Ask your health care provider when you can leave the wound uncovered.  Keep the dressing dry until your health care provider says it can be removed. Do not take baths, swim, use a hot tub, or do anything that would put the wound underwater until your health care provider approves. Ask your health care provider if you can take showers. You may only be allowed to take sponge baths. Medicines   If you were prescribed an antibiotic medicine, cream, or ointment, take or use the antibiotic as told by your health care provider. Do not stop taking or using the antibiotic even if your condition improves.  Take  over-the-counter and prescription medicines only as told by your health care provider. If you were prescribed pain medicine, take it 30 or more minutes before you do any wound care or as told by your health care provider. General instructions  Return to your normal activities as told by your health care provider. Ask your health care provider what activities are safe.  Do not scratch or pick at the wound.  Do not use any products that contain nicotine or tobacco, such as cigarettes and e-cigarettes. These may delay wound healing. If you need help quitting, ask your health care provider.  Keep all follow-up visits as told by your health care provider. This is important.  Eat a diet that includes protein, vitamin A, vitamin C, and other nutrient-rich foods to help the wound heal. ? Foods rich in protein include meat, dairy, beans, nuts, and other sources. ? Foods rich in vitamin A include carrots and dark green, leafy vegetables. ? Foods rich in vitamin C include citrus, tomatoes, and other fruits and vegetables. ? Nutrient-rich foods have protein, carbohydrates, fat, vitamins, or minerals. Eat a variety of healthy foods including vegetables, fruits, and whole grains. Contact a health care provider if:  You received a tetanus shot and you have swelling, severe pain, redness, or bleeding at the injection site.  Your pain is not controlled with medicine.  You have redness, swelling, or pain around the wound.    You have fluid or blood coming from the wound.  Your wound feels warm to the touch.  You have pus or a bad smell coming from the wound.  You have a fever or chills.  You are nauseous or you vomit.  You are dizzy. Get help right away if:  You have a red streak going away from your wound.  The edges of the wound open up and separate.  Your wound is bleeding, and the bleeding does not stop with gentle pressure.  You have a rash.  You faint.  You have trouble breathing.  Summary  Always wash your hands with soap and water before changing your bandage (dressing).  To help with healing, eat foods that are rich in protein, vitamin A, vitamin C, and other nutrients.  Check your wound every day for signs of infection. Contact your health care provider if you suspect that your wound is infected. This information is not intended to replace advice given to you by your health care provider. Make sure you discuss any questions you have with your health care provider. Document Released: 04/24/2008 Document Revised: 11/03/2018 Document Reviewed: 01/31/2016 Elsevier Patient Education  2020 [employer] Inc[employer]  

## 2019-06-13 NOTE — Progress Notes (Signed)
Subjective:  Patient ID: [name], female    DOB: 1933-12-15, 85 y.o.   MRN: [id number]  Patient Care Team: [name], MD as PCP - General (Family Medicine) Lorretta Harp, MD as PCP - Cardiology (Cardiology) Thompson Grayer, MD as PCP - Electrophysiology (Cardiology) Curt Bears, MD as Consulting Physician (Oncology) Lorretta Harp, MD as Consulting Physician (Cardiology) Latanya Maudlin, MD as Consulting Physician (Orthopedic Surgery) Pieter Partridge, DO as Consulting Physician (Neurology)   Chief Complaint:  Wound Check (right lower leg - 2 day )   HPI: [name] is a 85 y.o. female presenting on 06/12/2019 for Wound Check (right lower leg - 2 day )   Pt returns today for wound recheck. Pt experienced a skin tear to her right shin on 06/06/2019. Pt is anticoagulated and wound healing was complicated by continued oozing and bleeding. Pt had cautery performed in office x 2. Pt and family report the dressing for yesterday has not become saturated.   Wound Check She was originally treated 5 to 10 days ago. Previous treatment included wound cleansing or irrigation. There has been no drainage from the wound. The redness has improved. There is no swelling present. The pain has improved. She has no difficulty moving the affected extremity or digit.   There are no diagnoses linked to this encounter.    Relevant past medical, surgical, family, and social history reviewed and updated as indicated.  Allergies and medications reviewed and updated. Date reviewed: Chart in Epic.   Past Medical History:  Diagnosis Date   Acute respiratory failure with hypoxia ([place]) 10/23/2015   Allergic rhinitis    PT. DENIES   Anxiety    Aortic insufficiency    Echo 04/29/2018: EF 65-70, mild AS (mean 13), mod AI, Asc Aorta 42 mm (mildly dilated), mild LAE, PASP 41, pericardium normal in appearance.    Arthritis    NECK   Ataxia    Back pain 12/13/2016   Bradycardia      primarily nocturnal   Burning tongue syndrome 25 years   Cancer ([place]) dx'd 06/2016   liver   Cataract    Cerebellar degeneration ([place])    Chest pain 12/13/2016   Atypical chest pain   Chronic urinary tract infection    Complication of anesthesia    low o2 sats, coded 30 years ago   CVA (cerebral infarction) 05/2003   Dehydration with hyponatremia 03/23/2018   Depression    Encounter for antineoplastic chemotherapy 10/09/2016   Fatigue 01/03/2015   Gait disorder    Gastric polyps    GERD (gastroesophageal reflux disease)    Goals of care, counseling/discussion 10/09/2016   H/O: CVA (cerebrovascular accident) 07/14/2016   High cholesterol    History of pericarditis    Hyperlipidemia    Hypotension    Hypothyroidism    IBS (irritable bowel syndrome)    Neuroendocrine cancer ([place]) 08/23/2016   Obesity    Paroxysmal atrial fibrillation ([name])    chads2vasc score is 6,  she is felt to be a poor candidate for anticoagulation   Pericarditis    Personal history of arterial venous malformation (AVM)    right side of face   Seizure disorder ([place])    Seizures ([place]) 2003   " smelling"- Gabapentin "no problem"   Shortness of breath dyspnea    with exertion   Sick sinus syndrome ([place])    Sternum fx 10/27/2013   Stroke ([place]) 5 years ago  Right side of face weak, slurred speach-    Thyroid disease    TIA (transient ischemic attack)    UTI (lower urinary tract infection) 03/27/2016   "frequently"    Past Surgical History:  Procedure Laterality Date   APPENDECTOMY  85 years old   COLONOSCOPY  2006, 2009   COLONOSCOPY WITH PROPOFOL N/A 03/28/2016   Procedure: COLONOSCOPY WITH PROPOFOL;  Surgeon: Gatha Mayer, MD;  Location: [facility];  Service: Endoscopy;  Laterality: N/A;   CORONARY ANGIOGRAPHY N/A 05/22/2018   Procedure: CORONARY ANGIOGRAPHY (CATH LAB);  Surgeon: Lorretta Harp, MD;  Location: [facility];  Service:  Cardiovascular;  Laterality: N/A;   cyst removed  35 years ago   EP IMPLANTABLE DEVICE N/A 01/06/2015   Procedure: Loop Recorder Insertion;  Surgeon: Thompson Grayer, MD;  Location: [facility];  Service: Cardiovascular;  Laterality: N/A;   EYE SURGERY Right    Cataract   IR ANGIOGRAM SELECTIVE EACH ADDITIONAL VESSEL  11/28/2016   IR ANGIOGRAM SELECTIVE EACH ADDITIONAL VESSEL  11/28/2016   IR ANGIOGRAM SELECTIVE EACH ADDITIONAL VESSEL  11/28/2016   IR ANGIOGRAM SELECTIVE EACH ADDITIONAL VESSEL  11/28/2016   IR ANGIOGRAM SELECTIVE EACH ADDITIONAL VESSEL  12/13/2016   IR ANGIOGRAM SELECTIVE EACH ADDITIONAL VESSEL  12/13/2016   IR ANGIOGRAM SELECTIVE EACH ADDITIONAL VESSEL  01/09/2017   IR ANGIOGRAM VISCERAL SELECTIVE  11/28/2016   IR ANGIOGRAM VISCERAL SELECTIVE  11/28/2016   IR ANGIOGRAM VISCERAL SELECTIVE  12/13/2016   IR ANGIOGRAM VISCERAL SELECTIVE  12/13/2016   IR ANGIOGRAM VISCERAL SELECTIVE  01/09/2017   IR EMBO ARTERIAL NOT HEMORR HEMANG INC GUIDE ROADMAPPING  11/28/2016   IR EMBO TUMOR ORGAN ISCHEMIA INFARCT INC GUIDE ROADMAPPING  12/13/2016   IR EMBO TUMOR ORGAN ISCHEMIA INFARCT INC GUIDE ROADMAPPING  01/09/2017   IR IVC FILTER PLMT / S&I /IMG GUID/MOD SED  04/24/2017   IR RADIOLOGIST EVAL & MGMT  11/06/2016   IR RADIOLOGIST EVAL & MGMT  01/02/2017   IR RADIOLOGIST EVAL & MGMT  02/05/2017   IR RADIOLOGIST EVAL & MGMT  05/02/2017   IR RADIOLOGIST EVAL & MGMT  10/17/2017   IR US GUIDE VASC ACCESS RIGHT  11/28/2016   IR US GUIDE VASC ACCESS RIGHT  12/13/2016   IR US GUIDE VASC ACCESS RIGHT  01/09/2017   KNEE ARTHROSCOPY Right 11/14/2006   KNEE ARTHROSCOPY Bilateral 5 and 6 years ago   KNEE ARTHROSCOPY WITH LATERAL MENISECTOMY  07/03/2012   Procedure: KNEE ARTHROSCOPY WITH LATERAL MENISECTOMY;  Surgeon: Magnus Sinning, MD;  Location: [facility];  Service: Orthopedics;  Laterality: Left;  with Partial Lateral Menisectomy and Medial Menisectomy. Shaving of medial and lateral femoral  condyles. Shaving of patella. Removal of a loose body   LOOP RECORDER REMOVAL N/A 05/22/2018   Procedure: LOOP RECORDER REMOVAL;  Surgeon: Thompson Grayer, MD;  Location: [facility];  Service: Cardiovascular;  Laterality: N/A;   PACEMAKER IMPLANT N/A 05/22/2018   MDT Azure XT DR MRI implanted by Dr Rayann Heman for sick sinus syndrome   tibial and fibular internal fixation Left    TOTAL ABDOMINAL HYSTERECTOMY  85 years old   UPPER GASTROINTESTINAL ENDOSCOPY  2009, 2013    Social History   Socioeconomic History   Marital status: Married    Spouse name: Mckinzi Eriksen   Number of children: 6   Years of education: 10   Highest education level: 10th grade  Occupational History   Occupation: Agricultural engineer  Social Needs  Financial resource strain: Not very hard   Food insecurity    Worry: Never true    Inability: Never true   Transportation needs    Medical: No    Non-medical: No  Tobacco Use   Smoking status: Former Smoker    Packs/day: 0.25    Years: 10.00    Pack years: 2.50    Types: Cigarettes    Quit date: 07/31/1975    Years since quitting: 43.8   Smokeless tobacco: Never Used  Substance and Sexual Activity   Alcohol use: No    Comment: Rare- maybe a drink ever 2 years   Drug use: No   Sexual activity: Not Currently  Lifestyle   Physical activity    Days per week: 0 days    Minutes per session: 0 min   Stress: Not at all  Relationships   Social connections    Talks on phone: More than three times a week    Gets together: More than three times a week    Attends religious service: Never    Active member of club or organization: No    Attends meetings of clubs or organizations: Never    Relationship status: Married   Intimate partner violence    Fear of current or ex partner: No    Emotionally abused: No    Physically abused: No    Forced sexual activity: No  Other Topics Concern   Not on file  Social History Narrative   Lives with husband,  does have stairs, does not use them. Pt completed 10th grade.    Outpatient Encounter Medications as of 06/12/2019  Medication Sig   amitriptyline (ELAVIL) 25 MG tablet TAKE 1 TABLET AT BEDTIME   amLODipine (NORVASC) 2.5 MG tablet TAKE 1 TABLET DAILY   capecitabine (XELODA) 500 MG tablet TAKE 3 TABLETS (1500MG ) BY MOUTH TWICE DAILY AFTER A MEAL. TAKE ON DAYS 1-14 OF EACH 28 DAY CYCLE   cephALEXin (KEFLEX) 500 MG capsule Take by mouth.   escitalopram (LEXAPRO) 20 MG tablet TAKE 1 TABLET AT BEDTIME   fluticasone (FLONASE) 50 MCG/ACT nasal spray Place 2 sprays into both nostrils at bedtime.   furosemide (LASIX) 20 MG tablet TAKE 1 TABLET DAILY AS NEEDED FOR FLUID RETENTION   gabapentin (NEURONTIN) 400 MG capsule TAKE 1 TO 2 CAPSULES THREE TIMES A DAY   hydroxypropyl methylcellulose / hypromellose (ISOPTO TEARS / GONIOVISC) 2.5 % ophthalmic solution Place 1 drop into both eyes daily.    levothyroxine (SYNTHROID) 75 MCG tablet Take 1 tablet (75 mcg total) by mouth daily before breakfast.   lovastatin (MEVACOR) 40 MG tablet Take 1 tablet (40 mg total) by mouth at bedtime.   Melatonin 3 MG TBDP Take 3-6 mg by mouth at bedtime as needed.   omeprazole (PRILOSEC) 40 MG capsule Take 1 capsule (40 mg total) by mouth daily.   ondansetron (ZOFRAN) 8 MG tablet Take 1 tablet (8 mg total) by mouth every 8 (eight) hours as needed for nausea or vomiting.   OXcarbazepine (TRILEPTAL) 150 MG tablet Take 1 tablet (150 mg total) by mouth 2 (two) times daily.   phenazopyridine (PYRIDIUM) 200 MG tablet Take 1 tablet by mouth 3 (three) times daily.   rivaroxaban (XARELTO) 20 MG TABS tablet TAKE 1 TABLET DAILY WITH SUPPER   temozolomide (TEMODAR) 140 MG capsule TAKE 2 CAPSULES (280MG ) BY MOUTH DAILY FOR 5 DAYS,ON DAYS 10-14 OF EACH 28DAY CYCLE. MAY TAKE ON EMPTY STOMACH AT BEDTIME TO DECREASE NAUSEA  No facility-administered encounter medications on file as of 06/12/2019.     Allergies  Allergen  Reactions   Levaquin [Levofloxacin] Shortness Of Breath   Lisinopril Cough    Review of Systems  Constitutional: Negative for activity change, appetite change, chills, diaphoresis, fatigue, fever and unexpected weight change.  HENT: Negative.   Eyes: Negative.   Respiratory: Negative for cough, chest tightness and shortness of breath.   Cardiovascular: Negative for chest pain, palpitations and leg swelling.  Gastrointestinal: Negative for blood in stool, constipation, diarrhea, nausea and vomiting.  Endocrine: Negative.   Genitourinary: Negative for dysuria, frequency and urgency.  Musculoskeletal: Positive for gait problem (in wheelchair). Negative for arthralgias and myalgias.  Skin: Positive for wound.  Allergic/Immunologic: Negative.   Neurological: Positive for weakness (generalized). Negative for dizziness and headaches.  Hematological: Negative.   Psychiatric/Behavioral: Negative for confusion, hallucinations, sleep disturbance and suicidal ideas.  All other systems reviewed and are negative.       Objective:  BP (!) 112/53    Pulse 80    Temp (!) 97.5 F (36.4 C)    Resp 20    SpO2 95%    Wt Readings from Last 3 Encounters:  06/09/19 223 lb (101.2 kg)  05/26/19 208 lb 6.4 oz (94.5 kg)  04/28/19 217 lb (98.4 kg)    Physical Exam Vitals signs and nursing note reviewed.  Constitutional:      General: She is not in acute distress.    Appearance: Normal appearance. She is well-developed and well-groomed. She is not ill-appearing, toxic-appearing or diaphoretic.  HENT:     Head: Normocephalic and atraumatic.     Jaw: There is normal jaw occlusion.     Right Ear: Hearing normal.     Left Ear: Hearing normal.     Nose: Nose normal.     Mouth/Throat:     Lips: Pink.     Mouth: Mucous membranes are moist.     Pharynx: Oropharynx is clear. Uvula midline.  Eyes:     General: Lids are normal.     Extraocular Movements: Extraocular movements intact.      Conjunctiva/sclera: Conjunctivae normal.     Pupils: Pupils are equal, round, and reactive to light.  Neck:     Musculoskeletal: Normal range of motion and neck supple.     Thyroid: No thyroid mass, thyromegaly or thyroid tenderness.     Vascular: No carotid bruit or JVD.     Trachea: Trachea and phonation normal.  Cardiovascular:     Rate and Rhythm: Normal rate. Rhythm regularly irregular.     Chest Wall: PMI is not displaced.     Pulses: Normal pulses.     Heart sounds: Normal heart sounds. No murmur. No friction rub. No gallop.   Pulmonary:     Effort: Pulmonary effort is normal. No respiratory distress.     Breath sounds: Normal breath sounds. No wheezing.  Abdominal:     General: Bowel sounds are normal. There is no distension or abdominal bruit.     Palpations: Abdomen is soft. There is no hepatomegaly or splenomegaly.     Tenderness: There is no abdominal tenderness. There is no right CVA tenderness or left CVA tenderness.     Hernia: No hernia is present.  Musculoskeletal: Normal range of motion.     Right lower leg: No edema.     Left lower leg: No edema.  Lymphadenopathy:     Cervical: No cervical adenopathy.  Skin:    General:  Skin is warm and dry.     Capillary Refill: Capillary refill takes less than 2 seconds.     Coloration: Skin is not cyanotic, jaundiced or pale.     Findings: Wound present. No rash.       Neurological:     General: No focal deficit present.     Mental Status: She is alert. Mental status is at baseline.     Cranial Nerves: Cranial nerves are intact.     Sensory: Sensation is intact.     Motor: Motor function is intact.     Coordination: Coordination is intact.     Gait: Gait is intact.     Deep Tendon Reflexes: Reflexes are normal and symmetric.  Psychiatric:        Attention and Perception: Attention and perception normal.        Mood and Affect: Mood and affect normal.        Speech: Speech normal.        Behavior: Behavior normal.  Behavior is cooperative.        Thought Content: Thought content normal.        Cognition and Memory: Cognition and memory normal.        Judgment: Judgment normal.     Results for orders placed or performed in visit on 06/09/19  Fingerstick Hemoglobin  Result Value Ref Range   Hemoglobin 10.3 (L) 11.1 - 15.9 g/dL     wound cleansed in office with sterile water and nonadherent dressing applied.covered with gauze and taped in place.   Pertinent labs & imaging results that were available during my care of the patient were reviewed by me and considered in my medical decision making.  Assessment & Plan:  Katlyne was seen today for wound check.  Diagnoses and all orders for this visit:  Noninfected skin tear of leg, right, sequela Healing wound to right lower leg without sings of infection. Wound care discussed in detail. Follow up in 1 week for wound recheck. Return precautions discussed in detail.     Continue all other maintenance medications.  Follow up plan: Return in about 1 week (around 06/19/2019), or if symptoms worsen or fail to improve, for wound recheck.  Continue healthy lifestyle choices, including diet (rich in fruits, vegetables, and lean proteins, and low in salt and simple carbohydrates) and exercise (at least 30 minutes of moderate physical activity daily).  Educational handout given for wound care  The above assessment and management plan was discussed with the patient. The patient verbalized understanding of and has agreed to the management plan. Patient is aware to call the clinic if they develop any new symptoms or if symptoms persist or worsen. Patient is aware when to return to the clinic for a follow-up visit. Patient educated on when it is appropriate to go to the emergency department.   Monia Pouch, FNP-C [facility] [phone]

## 2019-06-15 ENCOUNTER — Other Ambulatory Visit: Payer: Self-pay | Admitting: Family

## 2019-06-16 ENCOUNTER — Telehealth: Payer: Self-pay | Admitting: Family Medicine

## 2019-06-16 NOTE — Telephone Encounter (Signed)
FYI for provider  No response required.

## 2019-06-17 ENCOUNTER — Ambulatory Visit (INDEPENDENT_AMBULATORY_CARE_PROVIDER_SITE_OTHER): Payer: Medicare Other | Admitting: Family Medicine

## 2019-06-17 ENCOUNTER — Other Ambulatory Visit: Payer: Self-pay

## 2019-06-17 ENCOUNTER — Encounter: Payer: Self-pay | Admitting: Family Medicine

## 2019-06-17 VITALS — BP 138/68 | HR 62 | Temp 98.0°F | Resp 20

## 2019-06-17 DIAGNOSIS — S81811S Laceration without foreign body, right lower leg, sequela: Secondary | ICD-10-CM

## 2019-06-17 MED ORDER — SILVER SULFADIAZINE 1 % EX CREA: 1.0000 "application " | TOPICAL_CREAM | Freq: Every day | CUTANEOUS | 0 refills | Status: AC

## 2019-06-17 MED ORDER — ESOMEPRAZOLE MAGNESIUM 40 MG PO CPDR: 40.0000 mg | DELAYED_RELEASE_CAPSULE | Freq: Two times a day (BID) | ORAL | 1 refills | Status: DC

## 2019-06-17 NOTE — Progress Notes (Signed)
Chief Complaint  Patient presents with  . Recheck wound to right lower leg    HPI  Patient presents today for follow up of skin tear. DOI 11/8. Seen by Dr. Warrick Parisian originally. Cautery done to stop bleeding at that time. Also had erythema and placed on antibiotic. She has 3 days left. LEss red, painful and swollen today. Seems to be improving.   PMH: Smoking status noted ROS: Per HPI  Objective: BP 138/68   Pulse 62   Temp 98 F (36.7 C) (Temporal)   Resp 20   SpO2 97%  Gen: NAD, alert, cooperative with exam HEENT: NCAT, EOMI, PERRL CV: RRR, good S1/S2, no murmur Resp: CTABL, no wheezes, non-labored Ext: No edema, warm 4 cm grade 2-3 ulceration. No sign of infection. Neuro: Alert and oriented, No gross deficits   Assessment and plan:  1. Noninfected skin tear of leg, right, sequela     Meds ordered this encounter  Medications  . esomeprazole (NEXIUM) 40 MG capsule    Sig: Take 1 capsule (40 mg total) by mouth 2 (two) times daily before a meal.    Dispense:  180 capsule    Refill:  1  . silver sulfADIAZINE (SILVADENE) 1 % cream    Sig: Apply 1 application topically daily.    Dispense:  400 g    Refill:  0    No orders of the defined types were placed in this encounter.   Follow up as needed.  [name], MD

## 2019-06-18 ENCOUNTER — Other Ambulatory Visit: Payer: Self-pay | Admitting: Physician Assistant

## 2019-06-18 DIAGNOSIS — C7A8 Other malignant neuroendocrine tumors: Secondary | ICD-10-CM

## 2019-06-18 DIAGNOSIS — Z5111 Encounter for antineoplastic chemotherapy: Secondary | ICD-10-CM

## 2019-06-23 MED FILL — TEMOZOLOMIDE 140 MG CAPS: 140 | 28 days supply | Qty: 10 | Fill #0

## 2019-06-23 MED FILL — CAPECITABINE 500 MG TABS: 500 | 14 days supply | Qty: 84 | Fill #0

## 2019-06-23 NOTE — Progress Notes (Signed)
Remote pacemaker transmission.   

## 2019-06-30 ENCOUNTER — Other Ambulatory Visit: Payer: Self-pay

## 2019-07-01 ENCOUNTER — Ambulatory Visit: Payer: Medicare Other | Admitting: Family Medicine

## 2019-07-01 ENCOUNTER — Encounter: Payer: Self-pay | Admitting: Family Medicine

## 2019-07-01 ENCOUNTER — Ambulatory Visit (INDEPENDENT_AMBULATORY_CARE_PROVIDER_SITE_OTHER): Payer: Medicare Other | Admitting: Family Medicine

## 2019-07-01 VITALS — BP 132/59 | HR 60 | Temp 98.0°F | Ht 64.0 in | Wt 223.0 lb

## 2019-07-01 DIAGNOSIS — S81811S Laceration without foreign body, right lower leg, sequela: Secondary | ICD-10-CM

## 2019-07-01 NOTE — Progress Notes (Signed)
Assessment & Plan:  1. Noninfected skin tear of leg, right, sequela - Encouraged to continue daily cleansing of wound but to switch from hydrogen peroxide to normal saline.  May continue Silvadene cream but advised to decrease the amount they are putting on and explained that the maceration around the wound bed is due to it staying too wet.  No signs of infection noted.  Telfa nonadherent dressing.   Follow up plan: Return in about 2 weeks (around 07/15/2019) for wound re-check.  Hendricks Limes, MSN, APRN, FNP-C [facility]  Subjective:   Patient ID: [name], female    DOB: [birth year], 85 y.o.   MRN: [id number]  HPI: [name] is a 85 y.o. female presenting on 07/01/2019 for recheck leg wound, arm wound  Patient presents for a wound recheck.  Skin tear sustained on 06/05/2019.  She was seen at urgent care 2 days later where the bleeding was stopped with silver nitrate sticks x3 and a pressure dressing was applied.  Her leg started bleeding again on 06/09/2019 at which time cauterization was performed and another dressing was applied.   Patient seen again on 06/10/2019 to make sure she did not need to be cauterized again.  At that time a pressure dressing was applied.  Patient was seen for wound rechecks on 06/12/2019 and 06/17/2019, both of which appointments the leg was healing well.  Patient is on long-term anticoagulation with Xarelto due to history of pulmonary embolism.  They are applying Silvadene cream once daily after cleansing the wound with hydrogen peroxide.  They are keeping it covered with a Telfa dressing which is then covered with gauze and secured by a gauze wrap.  ROS: Negative unless specifically indicated above in HPI.   Relevant past medical history reviewed and updated as indicated.   Allergies and medications reviewed and updated.   Current Outpatient Medications:  .  amitriptyline (ELAVIL) 25 MG tablet, TAKE 1 TABLET AT BEDTIME,  Disp: 90 tablet, Rfl: 3 .  amLODipine (NORVASC) 2.5 MG tablet, TAKE 1 TABLET DAILY, Disp: 90 tablet, Rfl: 1 .  capecitabine (XELODA) 500 MG tablet, TAKE 3 TABLETS (1500MG ) BY MOUTH TWICE DAILY AFTER A MEAL. TAKE ON DAYS 1-14 OF EACH 28 DAY CYCLE, Disp: 84 tablet, Rfl: 2 .  escitalopram (LEXAPRO) 20 MG tablet, TAKE 1 TABLET AT BEDTIME, Disp: 90 tablet, Rfl: 3 .  esomeprazole (NEXIUM) 40 MG capsule, Take 1 capsule (40 mg total) by mouth 2 (two) times daily before a meal., Disp: 180 capsule, Rfl: 1 .  fluticasone (FLONASE) 50 MCG/ACT nasal spray, Place 2 sprays into both nostrils at bedtime., Disp: 48 g, Rfl: 3 .  furosemide (LASIX) 20 MG tablet, TAKE 1 TABLET DAILY AS NEEDED FOR FLUID RETENTION, Disp: 90 tablet, Rfl: 1 .  gabapentin (NEURONTIN) 400 MG capsule, TAKE 1 TO 2 CAPSULES THREE TIMES A DAY, Disp: 720 capsule, Rfl: 1 .  hydroxypropyl methylcellulose / hypromellose (ISOPTO TEARS / GONIOVISC) 2.5 % ophthalmic solution, Place 1 drop into both eyes daily. , Disp: , Rfl:  .  levothyroxine (SYNTHROID) 75 MCG tablet, Take 1 tablet (75 mcg total) by mouth daily before breakfast., Disp: 90 tablet, Rfl: 1 .  lovastatin (MEVACOR) 40 MG tablet, Take 1 tablet (40 mg total) by mouth at bedtime., Disp: 90 tablet, Rfl: 1 .  Melatonin 3 MG TBDP, Take 3-6 mg by mouth at bedtime as needed., Disp: 60 tablet, Rfl: 1 .  ondansetron (ZOFRAN) 8 MG tablet, Take 1 tablet (  8 mg total) by mouth every 8 (eight) hours as needed for nausea or vomiting., Disp: 90 tablet, Rfl: 1 .  OXcarbazepine (TRILEPTAL) 150 MG tablet, Take 1 tablet (150 mg total) by mouth 2 (two) times daily., Disp: 180 tablet, Rfl: 3 .  phenazopyridine (PYRIDIUM) 200 MG tablet, Take 1 tablet by mouth 3 (three) times daily., Disp: , Rfl:  .  rivaroxaban (XARELTO) 20 MG TABS tablet, TAKE 1 TABLET DAILY WITH SUPPER, Disp: 90 tablet, Rfl: 3 .  silver sulfADIAZINE (SILVADENE) 1 % cream, Apply 1 application topically daily., Disp: 400 g, Rfl: 0 .  temozolomide  (TEMODAR) 140 MG capsule, TAKE 2 CAPSULES (280MG ) BY MOUTH DAILY FOR 5 DAYS,ON DAYS 10-14 OF EACH 28DAY CYCLE. MAY TAKE ON EMPTY STOMACH AT BEDTIME TO DECREASE NAUSEA & VOMITING, Disp: 10 capsule, Rfl: 2  Allergies  Allergen Reactions  . Levaquin [Levofloxacin] Shortness Of Breath  . Lisinopril Cough    Objective:   BP (!) 132/59   Pulse 60   Temp 98 F (36.7 C) (Temporal)   Ht 5\' 4"  (1.626 m)   Wt 223 lb (101.2 kg)   SpO2 98%   BMI 38.28 kg/m    Physical Exam Vitals signs reviewed.  Constitutional:      General: She is not in acute distress.    Appearance: Normal appearance. She is obese. She is not ill-appearing, toxic-appearing or diaphoretic.  HENT:     Head: Normocephalic and atraumatic.  Eyes:     General: No scleral icterus.       Right eye: No discharge.        Left eye: No discharge.     Conjunctiva/sclera: Conjunctivae normal.  Neck:     Musculoskeletal: Normal range of motion.  Cardiovascular:     Rate and Rhythm: Normal rate.  Pulmonary:     Effort: Pulmonary effort is normal. No respiratory distress.  Musculoskeletal: Normal range of motion.  Skin:    General: Skin is warm and dry.     Capillary Refill: Capillary refill takes less than 2 seconds.       Neurological:     General: No focal deficit present.     Mental Status: She is alert and oriented to person, place, and time. Mental status is at baseline.  Psychiatric:        Mood and Affect: Mood normal.        Behavior: Behavior normal.        Thought Content: Thought content normal.        Judgment: Judgment normal.   Media Information

## 2019-07-06 ENCOUNTER — Inpatient Hospital Stay: Payer: Medicare Other | Attending: Internal Medicine

## 2019-07-06 ENCOUNTER — Other Ambulatory Visit: Payer: Self-pay

## 2019-07-06 ENCOUNTER — Ambulatory Visit (HOSPITAL_COMMUNITY)
Admission: RE | Admit: 2019-07-06 | Discharge: 2019-07-06 | Disposition: A | Discharge status: Home / Self Care | Payer: Medicare Other | Source: Ambulatory Visit | Attending: Internal Medicine | Admitting: Internal Medicine

## 2019-07-06 DIAGNOSIS — C7A8 Other malignant neuroendocrine tumors: Secondary | ICD-10-CM | POA: Insufficient documentation

## 2019-07-06 DIAGNOSIS — Z79899 Other long term (current) drug therapy: Secondary | ICD-10-CM | POA: Insufficient documentation

## 2019-07-06 DIAGNOSIS — C787 Secondary malignant neoplasm of liver and intrahepatic bile duct: Secondary | ICD-10-CM | POA: Diagnosis not present

## 2019-07-06 DIAGNOSIS — C7B8 Other secondary neuroendocrine tumors: Secondary | ICD-10-CM | POA: Insufficient documentation

## 2019-07-06 DIAGNOSIS — C7A090 Malignant carcinoid tumor of the bronchus and lung: Secondary | ICD-10-CM | POA: Diagnosis not present

## 2019-07-06 DIAGNOSIS — Z7901 Long term (current) use of anticoagulants: Secondary | ICD-10-CM | POA: Diagnosis not present

## 2019-07-06 DIAGNOSIS — D61818 Other pancytopenia: Secondary | ICD-10-CM | POA: Insufficient documentation

## 2019-07-06 DIAGNOSIS — Z86711 Personal history of pulmonary embolism: Secondary | ICD-10-CM | POA: Diagnosis not present

## 2019-07-06 LAB — CMP (CANCER CENTER ONLY)
ALT: 14 U/L (ref 0–44)
AST: 26 U/L (ref 15–41)
Albumin: 3.4 g/dL — ABNORMAL LOW (ref 3.5–5.0)
Alkaline Phosphatase: 146 U/L — ABNORMAL HIGH (ref 38–126)
Anion gap: 7 (ref 5–15)
BUN: 14 mg/dL (ref 8–23)
CO2: 25 mmol/L (ref 22–32)
Calcium: 9.2 mg/dL (ref 8.9–10.3)
Chloride: 100 mmol/L (ref 98–111)
Creatinine: 0.92 mg/dL (ref 0.44–1.00)
GFR, Est AFR Am: >60 mL/min (ref >60)
GFR, Estimated: 58 mL/min — ABNORMAL LOW (ref >60)
Glucose, Bld: 112 mg/dL — ABNORMAL HIGH (ref 70–99)
Potassium: 4.2 mmol/L (ref 3.5–5.1)
Sodium: 132 mmol/L — ABNORMAL LOW (ref 135–145)
Total Bilirubin: 0.5 mg/dL (ref 0.3–1.2)
Total Protein: 6.4 g/dL — ABNORMAL LOW (ref 6.5–8.1)

## 2019-07-06 LAB — CBC WITH DIFFERENTIAL (CANCER CENTER ONLY)
Abs Immature Granulocytes: 0.01 10*3/uL (ref 0.00–0.07)
Basophils Absolute: 0 10*3/uL (ref 0.0–0.1)
Basophils Relative: 1 %
Eosinophils Absolute: 0.1 10*3/uL (ref 0.0–0.5)
Eosinophils Relative: 5 %
HCT: 28.8 % — ABNORMAL LOW (ref 36.0–46.0)
Hemoglobin: 9.3 g/dL — ABNORMAL LOW (ref 12.0–15.0)
Immature Granulocytes: 0 %
Lymphocytes Relative: 22 %
Lymphs Abs: 0.7 10*3/uL (ref 0.7–4.0)
MCH: 30.7 pg (ref 26.0–34.0)
MCHC: 32.3 g/dL (ref 30.0–36.0)
MCV: 95 fL (ref 80.0–100.0)
Monocytes Absolute: 0.4 10*3/uL (ref 0.1–1.0)
Monocytes Relative: 14 %
Neutro Abs: 1.8 10*3/uL (ref 1.7–7.7)
Neutrophils Relative %: 58 %
Platelet Count: 135 10*3/uL — ABNORMAL LOW (ref 150–400)
RBC: 3.03 MIL/uL — ABNORMAL LOW (ref 3.87–5.11)
RDW: 19.4 % — ABNORMAL HIGH (ref 11.5–15.5)
WBC Count: 3.1 10*3/uL — ABNORMAL LOW (ref 4.0–10.5)
nRBC: 0 % (ref 0.0–0.2)

## 2019-07-06 MED ORDER — IOHEXOL 350 MG/ML SOLN: 75.0000 mL | Freq: Once | INTRAVENOUS | Status: DC | PRN

## 2019-07-06 MED ORDER — IOHEXOL 300 MG/ML  SOLN
100.0000 mL | Freq: Once | INTRAMUSCULAR | Status: AC | PRN
  Administered 2019-07-06: 100 mL via INTRAVENOUS

## 2019-07-06 MED ORDER — SODIUM CHLORIDE (PF) 0.9 % IJ SOLN
INTRAMUSCULAR | Status: AC
  Filled 2019-07-06: qty 50

## 2019-07-07 ENCOUNTER — Inpatient Hospital Stay (HOSPITAL_BASED_OUTPATIENT_CLINIC_OR_DEPARTMENT_OTHER): Payer: Medicare Other | Admitting: Internal Medicine

## 2019-07-07 ENCOUNTER — Encounter: Payer: Self-pay | Admitting: Internal Medicine

## 2019-07-07 ENCOUNTER — Other Ambulatory Visit: Payer: Self-pay

## 2019-07-07 ENCOUNTER — Telehealth: Payer: Self-pay | Admitting: Internal Medicine

## 2019-07-07 VITALS — BP 144/52 | HR 60 | Temp 98.7°F | Resp 20 | Ht 64.0 in | Wt 222.3 lb

## 2019-07-07 DIAGNOSIS — C3492 Malignant neoplasm of unspecified part of left bronchus or lung: Secondary | ICD-10-CM | POA: Diagnosis not present

## 2019-07-07 DIAGNOSIS — C7A090 Malignant carcinoid tumor of the bronchus and lung: Secondary | ICD-10-CM | POA: Diagnosis not present

## 2019-07-07 DIAGNOSIS — C7B8 Other secondary neuroendocrine tumors: Secondary | ICD-10-CM

## 2019-07-07 DIAGNOSIS — Z5111 Encounter for antineoplastic chemotherapy: Secondary | ICD-10-CM | POA: Diagnosis not present

## 2019-07-07 DIAGNOSIS — I10 Essential (primary) hypertension: Secondary | ICD-10-CM | POA: Diagnosis not present

## 2019-07-07 DIAGNOSIS — E039 Hypothyroidism, unspecified: Secondary | ICD-10-CM

## 2019-07-07 DIAGNOSIS — C787 Secondary malignant neoplasm of liver and intrahepatic bile duct: Secondary | ICD-10-CM

## 2019-07-07 DIAGNOSIS — C7A8 Other malignant neuroendocrine tumors: Secondary | ICD-10-CM | POA: Diagnosis not present

## 2019-07-07 NOTE — Telephone Encounter (Signed)
Scheduled appt per 12/8 los - gave patient copy of AVS

## 2019-07-07 NOTE — Progress Notes (Signed)
[name] [phone]   Fax:(336) [place], [place] [address]  DIAGNOSIS:  1) Metastatic intermediate. Neuroendocrine tumor of lung primary diagnosed in January 2018 and presented with small bilateral pulmonary nodules in addition to multiple liver metastasis. 2) right lower lobe pulmonary embolism diagnosed incidentally on CT scan of the chest on 04/23/2017  PRIOR THERAPY:  1) Status post radio embolization with Y 90 to the liver lesions by interventional radiology. 2) status post IVC filter placement by interventional radiology on 04/24/2017  CURRENT THERAPY: Xeloda 750 MG/M2 twice a day days 1-14 and Temodar 150 MG/M2 days 10-14 every 4 weeks. Status post 38 cycles.  She start cycle #39 on June 28, 2019.  INTERVAL HISTORY: [name] 85 y.o. female returns to the clinic today for follow-up visit accompanied by her husband.  The patient is feeling fine today with no concerning complaints.  She continues to tolerate her treatment with Xeloda and Temodar fairly well.  She is currently undergoing treatment with cycle #29.  She denied having any chest pain, shortness of breath, cough or hemoptysis.  She denied having any fever or chills.  She has no nausea, vomiting, diarrhea or constipation.  She has no headache or visual changes.  She has no abdominal pain.  The patient had repeat CT scan of the chest, abdomen pelvis performed recently and she is here for evaluation and discussion of her scan results.  MEDICAL HISTORY: Past Medical History:  Diagnosis Date   Acute respiratory failure with hypoxia ([place]) 10/23/2015   Allergic rhinitis    PT. DENIES   Anxiety    Aortic insufficiency    Echo 04/29/2018: EF 65-70, mild AS (mean 13), mod AI, Asc Aorta 42 mm (mildly dilated), mild LAE, PASP 41, pericardium normal in appearance.    Arthritis    NECK   Ataxia    Back pain 12/13/2016    Bradycardia    primarily nocturnal   Burning tongue syndrome 25 years   Cancer ([place]) dx'd 06/2016   liver   Cataract    Cerebellar degeneration ([place])    Chest pain 12/13/2016   Atypical chest pain   Chronic urinary tract infection    Complication of anesthesia    low o2 sats, coded 30 years ago   CVA (cerebral infarction) 05/2003   Dehydration with hyponatremia 03/23/2018   Depression    Encounter for antineoplastic chemotherapy 10/09/2016   Fatigue 01/03/2015   Gait disorder    Gastric polyps    GERD (gastroesophageal reflux disease)    Goals of care, counseling/discussion 10/09/2016   H/O: CVA (cerebrovascular accident) 07/14/2016   High cholesterol    History of pericarditis    Hyperlipidemia    Hypotension    Hypothyroidism    IBS (irritable bowel syndrome)    Neuroendocrine cancer ([facility]) 08/23/2016   Obesity    Paroxysmal atrial fibrillation ([name])    chads2vasc score is 6,  she is felt to be a poor candidate for anticoagulation   Pericarditis    Personal history of arterial venous malformation (AVM)    right side of face   Seizure disorder ([place])    Seizures ([place]) 2003   " smelling"- Gabapentin "no problem"   Shortness of breath dyspnea    with exertion   Sick sinus syndrome ([place])    Sternum fx 10/27/2013   Stroke ([place]) 5 years ago   Right side  of face weak, slurred speach-    Thyroid disease    TIA (transient ischemic attack)    UTI (lower urinary tract infection) 03/27/2016   "frequently"    ALLERGIES:  is allergic to levaquin [levofloxacin] and lisinopril.  MEDICATIONS:  Current Outpatient Medications  Medication Sig Dispense Refill   amitriptyline (ELAVIL) 25 MG tablet TAKE 1 TABLET AT BEDTIME 90 tablet 3   amLODipine (NORVASC) 2.5 MG tablet TAKE 1 TABLET DAILY 90 tablet 1   capecitabine (XELODA) 500 MG tablet TAKE 3 TABLETS (1500MG ) BY MOUTH TWICE DAILY AFTER A MEAL. TAKE ON DAYS 1-14 OF EACH 28 DAY CYCLE 84 tablet 2    escitalopram (LEXAPRO) 20 MG tablet TAKE 1 TABLET AT BEDTIME 90 tablet 3   esomeprazole (NEXIUM) 40 MG capsule Take 1 capsule (40 mg total) by mouth 2 (two) times daily before a meal. 180 capsule 1   fluticasone (FLONASE) 50 MCG/ACT nasal spray Place 2 sprays into both nostrils at bedtime. 48 g 3   furosemide (LASIX) 20 MG tablet TAKE 1 TABLET DAILY AS NEEDED FOR FLUID RETENTION 90 tablet 1   gabapentin (NEURONTIN) 400 MG capsule TAKE 1 TO 2 CAPSULES THREE TIMES A DAY 720 capsule 1   hydroxypropyl methylcellulose / hypromellose (ISOPTO TEARS / GONIOVISC) 2.5 % ophthalmic solution Place 1 drop into both eyes daily.      levothyroxine (SYNTHROID) 75 MCG tablet Take 1 tablet (75 mcg total) by mouth daily before breakfast. 90 tablet 1   lovastatin (MEVACOR) 40 MG tablet Take 1 tablet (40 mg total) by mouth at bedtime. 90 tablet 1   Melatonin 3 MG TBDP Take 3-6 mg by mouth at bedtime as needed. 60 tablet 1   ondansetron (ZOFRAN) 8 MG tablet Take 1 tablet (8 mg total) by mouth every 8 (eight) hours as needed for nausea or vomiting. 90 tablet 1   OXcarbazepine (TRILEPTAL) 150 MG tablet Take 1 tablet (150 mg total) by mouth 2 (two) times daily. 180 tablet 3   phenazopyridine (PYRIDIUM) 200 MG tablet Take 1 tablet by mouth 3 (three) times daily.     rivaroxaban (XARELTO) 20 MG TABS tablet TAKE 1 TABLET DAILY WITH SUPPER 90 tablet 3   silver sulfADIAZINE (SILVADENE) 1 % cream Apply 1 application topically daily. 400 g 0   temozolomide (TEMODAR) 140 MG capsule TAKE 2 CAPSULES (280MG ) BY MOUTH DAILY FOR 5 DAYS,ON DAYS 10-14 OF EACH 28DAY CYCLE. MAY TAKE ON EMPTY STOMACH AT BEDTIME TO DECREASE NAUSEA & VOMITING 10 capsule 2   No current facility-administered medications for this visit.     SURGICAL HISTORY:  Past Surgical History:  Procedure Laterality Date   APPENDECTOMY  85 years old   COLONOSCOPY  2006, 2009   COLONOSCOPY WITH PROPOFOL N/A 03/28/2016   Procedure: COLONOSCOPY WITH  PROPOFOL;  Surgeon: Gatha Mayer, MD;  Location: [facility];  Service: Endoscopy;  Laterality: N/A;   CORONARY ANGIOGRAPHY N/A 05/22/2018   Procedure: CORONARY ANGIOGRAPHY (CATH LAB);  Surgeon: Lorretta Harp, MD;  Location: [facility];  Service: Cardiovascular;  Laterality: N/A;   cyst removed  35 years ago   EP IMPLANTABLE DEVICE N/A 01/06/2015   Procedure: Loop Recorder Insertion;  Surgeon: Thompson Grayer, MD;  Location: [facility];  Service: Cardiovascular;  Laterality: N/A;   EYE SURGERY Right    Cataract   IR ANGIOGRAM SELECTIVE EACH ADDITIONAL VESSEL  11/28/2016   IR ANGIOGRAM SELECTIVE EACH ADDITIONAL VESSEL  11/28/2016   IR ANGIOGRAM SELECTIVE  EACH ADDITIONAL VESSEL  11/28/2016   IR ANGIOGRAM SELECTIVE EACH ADDITIONAL VESSEL  11/28/2016   IR ANGIOGRAM SELECTIVE EACH ADDITIONAL VESSEL  12/13/2016   IR ANGIOGRAM SELECTIVE EACH ADDITIONAL VESSEL  12/13/2016   IR ANGIOGRAM SELECTIVE EACH ADDITIONAL VESSEL  01/09/2017   IR ANGIOGRAM VISCERAL SELECTIVE  11/28/2016   IR ANGIOGRAM VISCERAL SELECTIVE  11/28/2016   IR ANGIOGRAM VISCERAL SELECTIVE  12/13/2016   IR ANGIOGRAM VISCERAL SELECTIVE  12/13/2016   IR ANGIOGRAM VISCERAL SELECTIVE  01/09/2017   IR EMBO ARTERIAL NOT HEMORR HEMANG INC GUIDE ROADMAPPING  11/28/2016   IR EMBO TUMOR ORGAN ISCHEMIA INFARCT INC GUIDE ROADMAPPING  12/13/2016   IR EMBO TUMOR ORGAN ISCHEMIA INFARCT INC GUIDE ROADMAPPING  01/09/2017   IR IVC FILTER PLMT / S&I /IMG GUID/MOD SED  04/24/2017   IR RADIOLOGIST EVAL & MGMT  11/06/2016   IR RADIOLOGIST EVAL & MGMT  01/02/2017   IR RADIOLOGIST EVAL & MGMT  02/05/2017   IR RADIOLOGIST EVAL & MGMT  05/02/2017   IR RADIOLOGIST EVAL & MGMT  10/17/2017   IR US GUIDE VASC ACCESS RIGHT  11/28/2016   IR US GUIDE VASC ACCESS RIGHT  12/13/2016   IR US GUIDE VASC ACCESS RIGHT  01/09/2017   KNEE ARTHROSCOPY Right 11/14/2006   KNEE ARTHROSCOPY Bilateral 5 and 6 years ago   KNEE ARTHROSCOPY WITH LATERAL  MENISECTOMY  07/03/2012   Procedure: KNEE ARTHROSCOPY WITH LATERAL MENISECTOMY;  Surgeon: Magnus Sinning, MD;  Location: [facility];  Service: Orthopedics;  Laterality: Left;  with Partial Lateral Menisectomy and Medial Menisectomy. Shaving of medial and lateral femoral condyles. Shaving of patella. Removal of a loose body   LOOP RECORDER REMOVAL N/A 05/22/2018   Procedure: LOOP RECORDER REMOVAL;  Surgeon: Thompson Grayer, MD;  Location: [facility];  Service: Cardiovascular;  Laterality: N/A;   PACEMAKER IMPLANT N/A 05/22/2018   MDT Azure XT DR MRI implanted by Dr Rayann Heman for sick sinus syndrome   tibial and fibular internal fixation Left    TOTAL ABDOMINAL HYSTERECTOMY  85 years old   UPPER GASTROINTESTINAL ENDOSCOPY  2009, 2013    REVIEW OF SYSTEMS:  Constitutional: positive for fatigue Eyes: negative Ears, nose, mouth, throat, and face: negative Respiratory: negative Cardiovascular: negative Gastrointestinal: negative Genitourinary:negative Integument/breast: negative Hematologic/lymphatic: negative Musculoskeletal:positive for muscle weakness Neurological: negative Behavioral/Psych: negative Endocrine: negative Allergic/Immunologic: negative   PHYSICAL EXAMINATION: General appearance: alert, cooperative, fatigued and no distress Head: Normocephalic, without obvious abnormality, atraumatic Neck: no adenopathy, no JVD, supple, symmetrical, trachea midline and thyroid not enlarged, symmetric, no tenderness/mass/nodules Lymph nodes: Cervical, supraclavicular, and axillary nodes normal. Resp: clear to auscultation bilaterally Back: symmetric, no curvature. ROM normal. No CVA tenderness. Cardio: regular rate and rhythm, S1, S2 normal, no murmur, click, rub or gallop GI: soft, non-tender; bowel sounds normal; no masses,  no organomegaly Extremities: extremities normal, atraumatic, no cyanosis or edema Neurologic: Alert and oriented X 3, normal strength and tone. Normal  symmetric reflexes. Normal coordination and gait  ECOG PERFORMANCE STATUS: 1 - Symptomatic but completely ambulatory  Blood pressure (!) 144/52, pulse 60, temperature 98.7 F (37.1 C), temperature source Oral, resp. rate 20, height 5\' 4"  (1.626 m), weight 222 lb 4.8 oz (100.8 kg), SpO2 100 %.  LABORATORY DATA: Lab Results  Component Value Date   WBC 3.1 (L) 07/06/2019   HGB 9.3 (L) 07/06/2019   HCT 28.8 (L) 07/06/2019   MCV 95.0 07/06/2019   PLT 135 (L) 07/06/2019      Chemistry  Component Value Date/Time   NA 132 (L) 07/06/2019 1313   NA 140 10/10/2018 1548   NA 135 (L) 07/10/2017 1142   K 4.2 07/06/2019 1313   K 4.6 07/10/2017 1142   CL 100 07/06/2019 1313   CO2 25 07/06/2019 1313   CO2 25 07/10/2017 1142   BUN 14 07/06/2019 1313   BUN 14 10/10/2018 1548   BUN 11.4 07/10/2017 1142   CREATININE 0.92 07/06/2019 1313   CREATININE 1.0 07/10/2017 1142      Component Value Date/Time   CALCIUM 9.2 07/06/2019 1313   CALCIUM 9.9 07/10/2017 1142   ALKPHOS 146 (H) 07/06/2019 1313   ALKPHOS 93 07/10/2017 1142   AST 26 07/06/2019 1313   AST 43 (H) 07/10/2017 1142   ALT 14 07/06/2019 1313   ALT 28 07/10/2017 1142   BILITOT 0.5 07/06/2019 1313   BILITOT 0.56 07/10/2017 1142       RADIOGRAPHIC STUDIES: Ct Chest W Contrast  Result Date: 07/06/2019 CLINICAL DATA:  Follow-up metastatic neuroendocrine tumor of the GI tract. EXAM: CT CHEST, ABDOMEN, AND PELVIS WITH CONTRAST TECHNIQUE: Multidetector CT imaging of the chest, abdomen and pelvis was performed following the standard protocol during bolus administration of intravenous contrast. CONTRAST:  148mL OMNIPAQUE IOHEXOL 300 MG/ML  SOLN COMPARISON:  04/10/2019 FINDINGS: CT CHEST FINDINGS Cardiovascular: The ascending thoracic aorta appears increased in caliber measured 4 mm in diameter, image 76/4. Aortic atherosclerosis. Lad coronary artery calcifications. No pericardial effusion. Left chest wall pacer device is identified  with leads in the right atrial appendage and right ventricle. Mediastinum/Nodes: No enlarged mediastinal, hilar, or axillary lymph nodes. Thyroid gland, trachea, and esophagus demonstrate no significant findings. Lungs/Pleura: No pleural effusion identified. Small pulmonary nodules are again noted in both lungs: Index nodule in the anteromedial right upper lobe measures 9 mm, image 44/6. Stable. Index nodule in the anterolateral right upper lobe measures 5 mm, image 39/6. Previously 4 mm. Index perifissural nodule in the left midlung measures 4 mm, image 62/6. Unchanged. Musculoskeletal: No chest wall mass or suspicious bone lesions identified. Chronic fracture involving the mid body of sternum. CT ABDOMEN PELVIS FINDINGS Hepatobiliary: Multifocal low-attenuation liver metastases: Index lesion in segment 2 measures 1.7 cm, image 37/2. Unchanged. Index lesion within segment 3 measures 3.5 cm, image 45/2. Unchanged. Index subcapsular lesion within segment 7 measures 1.7 cm, image 38/2. Previously 1.3 cm. Small stones are noted within the gallbladder. Unchanged. Pancreas: Unremarkable. No pancreatic ductal dilatation or surrounding inflammatory changes. Spleen: Normal in size without focal abnormality. Adrenals/Urinary Tract: Normal appearance of the adrenal glands. Normal appearance of the kidneys. No mass or hydronephrosis identified. Stomach/Bowel: Stomach is normal. The small bowel loops have a normal course and caliber without bowel obstruction. No bowel wall thickening, inflammation or distension. Vascular/Lymphatic: Aortic atherosclerosis. No aneurysm. IVC filter. No abdominopelvic adenopathy. Reproductive: Status post hysterectomy. No adnexal masses. Other: No ascites or focal fluid collections identified. Musculoskeletal: Degenerative disc disease within the lumbar spine. No aggressive lytic or sclerotic bone lesions noted. IMPRESSION: 1. Overall, no significant interval change in the appearance of multifocal  liver metastases and lung metastasis. 2. No new sites of disease. 3. Aortic atherosclerosis. Coronary artery calcifications noted. 4. Gallstones. 5. Mild aneurysmal dilatation of the ascending thoracic aorta measuring 4 cm. Recommend annual imaging followup by CTA or MRA. This recommendation follows 2010 ACCF/AHA/AATS/ACR/ASA/SCA/SCAI/SIR/STS/SVM Guidelines for the Diagnosis and Management of Patients with Thoracic Aortic Disease. Circulation. 2010; 121: Z610-R604. Aortic aneurysm NOS (ICD10-I71.9) Aortic Atherosclerosis (ICD10-I70.0). Electronically Signed  By: Kerby Moors M.D.   On: 07/06/2019 17:30   Ct Abdomen Pelvis W Contrast  Result Date: 07/06/2019 CLINICAL DATA:  Follow-up metastatic neuroendocrine tumor of the GI tract. EXAM: CT CHEST, ABDOMEN, AND PELVIS WITH CONTRAST TECHNIQUE: Multidetector CT imaging of the chest, abdomen and pelvis was performed following the standard protocol during bolus administration of intravenous contrast. CONTRAST:  154mL OMNIPAQUE IOHEXOL 300 MG/ML  SOLN COMPARISON:  04/10/2019 FINDINGS: CT CHEST FINDINGS Cardiovascular: The ascending thoracic aorta appears increased in caliber measured 4 mm in diameter, image 76/4. Aortic atherosclerosis. Lad coronary artery calcifications. No pericardial effusion. Left chest wall pacer device is identified with leads in the right atrial appendage and right ventricle. Mediastinum/Nodes: No enlarged mediastinal, hilar, or axillary lymph nodes. Thyroid gland, trachea, and esophagus demonstrate no significant findings. Lungs/Pleura: No pleural effusion identified. Small pulmonary nodules are again noted in both lungs: Index nodule in the anteromedial right upper lobe measures 9 mm, image 44/6. Stable. Index nodule in the anterolateral right upper lobe measures 5 mm, image 39/6. Previously 4 mm. Index perifissural nodule in the left midlung measures 4 mm, image 62/6. Unchanged. Musculoskeletal: No chest wall mass or suspicious bone  lesions identified. Chronic fracture involving the mid body of sternum. CT ABDOMEN PELVIS FINDINGS Hepatobiliary: Multifocal low-attenuation liver metastases: Index lesion in segment 2 measures 1.7 cm, image 37/2. Unchanged. Index lesion within segment 3 measures 3.5 cm, image 45/2. Unchanged. Index subcapsular lesion within segment 7 measures 1.7 cm, image 38/2. Previously 1.3 cm. Small stones are noted within the gallbladder. Unchanged. Pancreas: Unremarkable. No pancreatic ductal dilatation or surrounding inflammatory changes. Spleen: Normal in size without focal abnormality. Adrenals/Urinary Tract: Normal appearance of the adrenal glands. Normal appearance of the kidneys. No mass or hydronephrosis identified. Stomach/Bowel: Stomach is normal. The small bowel loops have a normal course and caliber without bowel obstruction. No bowel wall thickening, inflammation or distension. Vascular/Lymphatic: Aortic atherosclerosis. No aneurysm. IVC filter. No abdominopelvic adenopathy. Reproductive: Status post hysterectomy. No adnexal masses. Other: No ascites or focal fluid collections identified. Musculoskeletal: Degenerative disc disease within the lumbar spine. No aggressive lytic or sclerotic bone lesions noted. IMPRESSION: 1. Overall, no significant interval change in the appearance of multifocal liver metastases and lung metastasis. 2. No new sites of disease. 3. Aortic atherosclerosis. Coronary artery calcifications noted. 4. Gallstones. 5. Mild aneurysmal dilatation of the ascending thoracic aorta measuring 4 cm. Recommend annual imaging followup by CTA or MRA. This recommendation follows 2010 ACCF/AHA/AATS/ACR/ASA/SCA/SCAI/SIR/STS/SVM Guidelines for the Diagnosis and Management of Patients with Thoracic Aortic Disease. Circulation. 2010; 121: T062-I948. Aortic aneurysm NOS (ICD10-I71.9) Aortic Atherosclerosis (ICD10-I70.0). Electronically Signed   By: Kerby Moors M.D.   On: 07/06/2019 17:30    ASSESSMENT AND  PLAN:  This is a very pleasant 85  years old white female with metastatic intermediate grade neuroendocrine carcinoma of questionable lung primary and multiple metastatic liver lesions and pancreatic lesions.She status post treatment with radio embolization with Y90 to the left and right lobe liver lesions.Status post treatment with Y 90 to the left and right lobes of the liver. She is currently undergoing systemic chemotherapy with Xeloda and Temodar status post 38 cycles.   She is currently undergoing cycle #39 which was a started on June 28, 2019. The patient is tolerating her treatment well with no concerning adverse effects. She had repeat CT scan of the chest, abdomen pelvis performed recently.  I personally and independently reviewed the scans and discussed the results with the patient and her husband.  Her scan showed no concerning findings for disease progression. I recommended for the patient to continue her current treatment with Xeloda and Temodar. For the anemia she continue on the oral iron tablets for now. I will see her back for follow-up visit in 6 weeks for evaluation with repeat blood work. She was advised to call immediately if she has any concerning symptoms in the interval. The patient voices understanding of current disease status and treatment options and is in agreement with the current care plan. All questions were answered. The patient knows to call the clinic with any problems, questions or concerns. We can certainly see the patient much sooner if necessary.  Disclaimer: This note was dictated with voice recognition software. Similar sounding words can inadvertently be transcribed and may not be corrected upon review.

## 2019-07-13 ENCOUNTER — Telehealth: Payer: Self-pay | Admitting: Family Medicine

## 2019-07-14 ENCOUNTER — Ambulatory Visit: Payer: Medicare Other | Admitting: Family Medicine

## 2019-07-14 ENCOUNTER — Other Ambulatory Visit: Payer: Self-pay

## 2019-07-15 ENCOUNTER — Ambulatory Visit: Payer: Medicare Other | Admitting: Family Medicine

## 2019-07-21 MED FILL — TEMOZOLOMIDE 140 MG CAPS: 140 | 28 days supply | Qty: 10 | Fill #1

## 2019-07-21 MED FILL — CAPECITABINE 500 MG TABS: 500 | 14 days supply | Qty: 84 | Fill #1

## 2019-08-03 ENCOUNTER — Encounter: Payer: Self-pay | Admitting: Family Medicine

## 2019-08-03 ENCOUNTER — Ambulatory Visit (INDEPENDENT_AMBULATORY_CARE_PROVIDER_SITE_OTHER): Payer: Medicare Other | Admitting: Family Medicine

## 2019-08-03 DIAGNOSIS — R34 Anuria and oliguria: Secondary | ICD-10-CM

## 2019-08-03 DIAGNOSIS — R410 Disorientation, unspecified: Secondary | ICD-10-CM | POA: Diagnosis not present

## 2019-08-03 DIAGNOSIS — R300 Dysuria: Secondary | ICD-10-CM | POA: Diagnosis not present

## 2019-08-03 DIAGNOSIS — C3492 Malignant neoplasm of unspecified part of left bronchus or lung: Secondary | ICD-10-CM

## 2019-08-03 DIAGNOSIS — C7931 Secondary malignant neoplasm of brain: Secondary | ICD-10-CM

## 2019-08-03 MED ORDER — SULFAMETHOXAZOLE-TRIMETHOPRIM 800-160 MG PO TABS: 1.0000 | ORAL_TABLET | Freq: Two times a day (BID) | ORAL | 0 refills | Status: DC

## 2019-08-03 NOTE — Progress Notes (Signed)
Subjective:    Patient ID: [name], female    DOB: [birth year], 86 y.o.   MRN: [id number]   HPI: [name] is a 86 y.o. female presenting for  Decreased urination for 12 hours. Starting a few days before Christmas, pt couldn't stand. Husband now using [employer] lift for transfers. Concerned that she might have had a stroke. She also has a brain tumor. She sits in her wheelchair most of the day and leans to her side and mumbles. She can get her words out but it takes about three times.  Sleeping a lot - almost all the time. Sister recently passed away from urosepsis. She has refused to go to the hospital. Husband hasn't been willing to force the issue , but he has Power of Oneta Rack so he is considering invoking that with EMS.Also considering hospice. Again, pt. Has been resistant.    Depression screen [facility] 2/9 07/01/2019 06/17/2019 06/12/2019 06/10/2019 06/09/2019  Decreased Interest 0 0 0 0 0  Down, Depressed, Hopeless 0 0 0 0 0  PHQ - 2 Score 0 0 0 0 0  Altered sleeping - - - - -  Tired, decreased energy - - - - -  Change in appetite - - - - -  Feeling bad or failure about yourself  - - - - -  Trouble concentrating - - - - -  Moving slowly or fidgety/restless - - - - -  Suicidal thoughts - - - - -  PHQ-9 Score - - - - -  Difficult doing work/chores - - - - -  Some recent data might be hidden     Relevant past medical, surgical, family and social history reviewed and updated as indicated.  Interim medical history since our last visit reviewed. Allergies and medications reviewed and updated.  ROS:  Review of Systems  Unable to perform ROS: Mental status change     Social History   Tobacco Use  Smoking Status Former Smoker  . Packs/day: 0.25  . Years: 10.00  . Pack years: 2.50  . Types: Cigarettes  . Quit date: 07/31/1975  . Years since quitting: 44.0  Smokeless Tobacco Never Used       Objective:     Wt Readings from Last 3 Encounters:  07/07/19 222 lb 4.8  oz (100.8 kg)  07/01/19 223 lb (101.2 kg)  06/09/19 223 lb (101.2 kg)     Exam deferred. Pt. Harboring due to COVID 19. Phone visit performed.   Assessment & Plan:   1. Oliguria   2. Dysuria   3. Primary malignant neoplasm of left lung metastatic to other site ([place])   4. Brain metastasis ([place])   5. Confusion     Meds ordered this encounter  Medications  . sulfamethoxazole-trimethoprim (BACTRIM DS) 800-160 MG tablet    Sig: Take 1 tablet by mouth 2 (two) times daily.    Dispense:  14 tablet    Refill:  0    Orders Placed This Encounter  Procedures  . Urine culture  . urinalysis- dip and micro      Diagnoses and all orders for this visit:  Oliguria  Dysuria -     urinalysis- dip and micro -     Urine culture  Primary malignant neoplasm of left lung metastatic to other site [facility])  Brain metastasis ([name])  Confusion  Other orders -     sulfamethoxazole-trimethoprim (BACTRIM DS) 800-160 MG tablet; Take 1 tablet by mouth 2 (two) times  daily.  Pt. Has advanced stages of lung cancer. This new event my be CVA, but also the brain mets affecting her. She is certainly a candidate for hospice caare. Resistant in the past, but condition has deteriorated. It is close to time forher husband to invoke the power of attorney and either take her to the hospital for evaluation or consider hospice on her behalf. The oliguria may be related to cancer or infection. It will be covered for infection with bactrim. Hospital evaluation would be essential for other causes from dehydration to acute azotemia and renal shut down. Her husband gave her history today. He is aware of these needs and concerns.  Virtual Visit via telephone Note  I discussed the limitations, risks, security and privacy concerns of performing an evaluation and management service by telephone and the availability of in person appointments. The patient was identified with two identifiers. Pt.expressed understanding and  agreed to proceed. Pt. Is at home. Dr. [name] is in his office.  Follow Up Instructions:   I discussed the assessment and treatment plan with the patient. The patient was provided an opportunity to ask questions and all were answered. The patient agreed with the plan and demonstrated an understanding of the instructions.   The patient was advised to call back or seek an in-person evaluation if the symptoms worsen or if the condition fails to improve as anticipated.   Total minutes including chart review and phone contact time: 29   Follow up plan: Return in about 2 weeks (around 08/17/2019) for in the office, sooner at hospital as explained above.[name], MD [place]

## 2019-08-06 LAB — URINE CULTURE

## 2019-08-17 MED FILL — TEMOZOLOMIDE 140 MG CAPS: 140 | 28 days supply | Qty: 10 | Fill #2

## 2019-08-17 MED FILL — CAPECITABINE 500 MG TABLET: 500 | 14 days supply | Qty: 84 | Fill #2

## 2019-08-18 ENCOUNTER — Inpatient Hospital Stay: Payer: Medicare Other

## 2019-08-18 ENCOUNTER — Other Ambulatory Visit: Payer: Self-pay | Admitting: Medical Oncology

## 2019-08-18 ENCOUNTER — Encounter: Payer: Self-pay | Admitting: Internal Medicine

## 2019-08-18 ENCOUNTER — Inpatient Hospital Stay: Payer: Medicare Other | Attending: Internal Medicine | Admitting: Internal Medicine

## 2019-08-18 ENCOUNTER — Other Ambulatory Visit: Payer: Self-pay

## 2019-08-18 VITALS — BP 142/64 | HR 60 | Temp 98.0°F | Resp 17 | Ht 64.0 in | Wt 211.5 lb

## 2019-08-18 DIAGNOSIS — D61818 Other pancytopenia: Secondary | ICD-10-CM | POA: Diagnosis not present

## 2019-08-18 DIAGNOSIS — C787 Secondary malignant neoplasm of liver and intrahepatic bile duct: Secondary | ICD-10-CM | POA: Insufficient documentation

## 2019-08-18 DIAGNOSIS — C7A090 Malignant carcinoid tumor of the bronchus and lung: Secondary | ICD-10-CM | POA: Diagnosis not present

## 2019-08-18 DIAGNOSIS — Z86711 Personal history of pulmonary embolism: Secondary | ICD-10-CM | POA: Diagnosis not present

## 2019-08-18 DIAGNOSIS — D539 Nutritional anemia, unspecified: Secondary | ICD-10-CM

## 2019-08-18 DIAGNOSIS — C7A8 Other malignant neuroendocrine tumors: Secondary | ICD-10-CM

## 2019-08-18 DIAGNOSIS — Z7901 Long term (current) use of anticoagulants: Secondary | ICD-10-CM | POA: Diagnosis not present

## 2019-08-18 DIAGNOSIS — C7B8 Other secondary neuroendocrine tumors: Secondary | ICD-10-CM | POA: Diagnosis not present

## 2019-08-18 DIAGNOSIS — Z79899 Other long term (current) drug therapy: Secondary | ICD-10-CM | POA: Insufficient documentation

## 2019-08-18 LAB — CMP (CANCER CENTER ONLY)
ALT: 10 U/L (ref 0–44)
AST: 20 U/L (ref 15–41)
Albumin: 3.3 g/dL — ABNORMAL LOW (ref 3.5–5.0)
Alkaline Phosphatase: 136 U/L — ABNORMAL HIGH (ref 38–126)
Anion gap: 9 (ref 5–15)
BUN: 15 mg/dL (ref 8–23)
CO2: 22 mmol/L (ref 22–32)
Calcium: 8.7 mg/dL — ABNORMAL LOW (ref 8.9–10.3)
Chloride: 106 mmol/L (ref 98–111)
Creatinine: 0.86 mg/dL (ref 0.44–1.00)
GFR, Est AFR Am: >60 mL/min (ref >60)
GFR, Estimated: >60 mL/min (ref >60)
Glucose, Bld: 131 mg/dL — ABNORMAL HIGH (ref 70–99)
Potassium: 4.1 mmol/L (ref 3.5–5.1)
Sodium: 137 mmol/L (ref 135–145)
Total Bilirubin: 0.4 mg/dL (ref 0.3–1.2)
Total Protein: 6.2 g/dL — ABNORMAL LOW (ref 6.5–8.1)

## 2019-08-18 LAB — CBC WITH DIFFERENTIAL (CANCER CENTER ONLY)
Abs Immature Granulocytes: 0 10*3/uL (ref 0.00–0.07)
Basophils Absolute: 0 10*3/uL (ref 0.0–0.1)
Basophils Relative: 0 %
Eosinophils Absolute: 0.2 10*3/uL (ref 0.0–0.5)
Eosinophils Relative: 10 %
HCT: 27.5 % — ABNORMAL LOW (ref 36.0–46.0)
Hemoglobin: 8.3 g/dL — ABNORMAL LOW (ref 12.0–15.0)
Immature Granulocytes: 0 %
Lymphocytes Relative: 25 %
Lymphs Abs: 0.6 10*3/uL — ABNORMAL LOW (ref 0.7–4.0)
MCH: 29.3 pg (ref 26.0–34.0)
MCHC: 30.2 g/dL (ref 30.0–36.0)
MCV: 97.2 fL (ref 80.0–100.0)
Monocytes Absolute: 0.4 10*3/uL (ref 0.1–1.0)
Monocytes Relative: 17 %
Neutro Abs: 1 10*3/uL — ABNORMAL LOW (ref 1.7–7.7)
Neutrophils Relative %: 48 %
Platelet Count: 84 10*3/uL — ABNORMAL LOW (ref 150–400)
RBC: 2.83 MIL/uL — ABNORMAL LOW (ref 3.87–5.11)
RDW: 22.1 % — ABNORMAL HIGH (ref 11.5–15.5)
WBC Count: 2.2 10*3/uL — ABNORMAL LOW (ref 4.0–10.5)
nRBC: 0 % (ref 0.0–0.2)

## 2019-08-18 LAB — SAMPLE TO BLOOD BANK

## 2019-08-18 NOTE — Progress Notes (Signed)
[facility] Telephone:([phone]) [phone]   Fax:([phone]) [place], [place] [address]  DIAGNOSIS:  1) Metastatic intermediate. Neuroendocrine tumor of lung primary diagnosed in January 2018 and presented with small bilateral pulmonary nodules in addition to multiple liver metastasis. 2) right lower lobe pulmonary embolism diagnosed incidentally on CT scan of the chest on 04/23/2017  PRIOR THERAPY:  1) Status post radio embolization with Y 90 to the liver lesions by interventional radiology. 2) status post IVC filter placement by interventional radiology on 04/24/2017  CURRENT THERAPY: Xeloda 750 MG/M2 twice a day days 1-14 and Temodar 150 MG/M2 days 10-14 every 4 weeks. Status post 40 cycles.  She start cycle #41 on August 23, 2019.  INTERVAL HISTORY: [name] 86 y.o. female returns to the clinic today for follow-up visit accompanied by her husband.  The patient is complaining of increasing fatigue and weakness and sleepy most of the time.  She has been tolerating her treatment with Temodar and Xeloda fairly well.  The patient denied having any current chest pain but has shortness of breath with exertion with some dry cough with no hemoptysis.  She is using Mucinex for the cough.  She denied having any fever or chills.  She has no current nausea, vomiting, diarrhea or constipation.  She denied having any headache or visual changes.  She is here today for evaluation and repeat blood work.  MEDICAL HISTORY: Past Medical History:  Diagnosis Date  . Acute respiratory failure with hypoxia ([facility]) 10/23/2015  . Allergic rhinitis    PT. DENIES  . Anxiety   . Aortic insufficiency    Echo 04/29/2018: EF 65-70, mild AS (mean 13), mod AI, Asc Aorta 42 mm (mildly dilated), mild LAE, PASP 41, pericardium normal in appearance.   . Arthritis    NECK  . Ataxia   . Back pain 12/13/2016  . Bradycardia    primarily nocturnal  .  Burning tongue syndrome 25 years  . Cancer ([facility]) dx'd 06/2016   liver  . Cataract   . Cerebellar degeneration ([facility])   . Chest pain 12/13/2016   Atypical chest pain  . Chronic urinary tract infection   . Complication of anesthesia    low o2 sats, coded 30 years ago  . CVA (cerebral infarction) 05/2003  . Dehydration with hyponatremia 03/23/2018  . Depression   . Encounter for antineoplastic chemotherapy 10/09/2016  . Fatigue 01/03/2015  . Gait disorder   . Gastric polyps   . GERD (gastroesophageal reflux disease)   . Goals of care, counseling/discussion 10/09/2016  . H/O: CVA (cerebrovascular accident) 07/14/2016  . High cholesterol   . History of pericarditis   . Hyperlipidemia   . Hypotension   . Hypothyroidism   . IBS (irritable bowel syndrome)   . Neuroendocrine cancer ([facility]) 08/23/2016  . Obesity   . Paroxysmal atrial fibrillation ([name])    chads2vasc score is 6,  she is felt to be a poor candidate for anticoagulation  . Pericarditis   . Personal history of arterial venous malformation (AVM)    right side of face  . Seizure disorder ([facility])   . Seizures ([facility]) 2003   " smelling"- Gabapentin "no problem"  . Shortness of breath dyspnea    with exertion  . Sick sinus syndrome ([facility])   . Sternum fx 10/27/2013  . Stroke [facility]) 5 years ago   Right side of face weak, slurred speach-   .  Thyroid disease   . TIA (transient ischemic attack)   . UTI (lower urinary tract infection) 03/27/2016   "frequently"    ALLERGIES:  is allergic to levaquin [levofloxacin] and lisinopril.  MEDICATIONS:  Current Outpatient Medications  Medication Sig Dispense Refill  . amitriptyline (ELAVIL) 25 MG tablet TAKE 1 TABLET AT BEDTIME 90 tablet 3  . amLODipine (NORVASC) 2.5 MG tablet TAKE 1 TABLET DAILY 90 tablet 1  . capecitabine (XELODA) 500 MG tablet TAKE 3 TABLETS (1500MG ) BY MOUTH TWICE DAILY AFTER A MEAL. TAKE ON DAYS 1-14 OF EACH 28 DAY CYCLE 84 tablet 2  . escitalopram (LEXAPRO) 20 MG tablet  TAKE 1 TABLET AT BEDTIME 90 tablet 3  . esomeprazole (NEXIUM) 40 MG capsule Take 1 capsule (40 mg total) by mouth 2 (two) times daily before a meal. 180 capsule 1  . fluticasone (FLONASE) 50 MCG/ACT nasal spray Place 2 sprays into both nostrils at bedtime. 48 g 3  . furosemide (LASIX) 20 MG tablet TAKE 1 TABLET DAILY AS NEEDED FOR FLUID RETENTION 90 tablet 1  . gabapentin (NEURONTIN) 400 MG capsule TAKE 1 TO 2 CAPSULES THREE TIMES A DAY 720 capsule 1  . hydroxypropyl methylcellulose / hypromellose (ISOPTO TEARS / GONIOVISC) 2.5 % ophthalmic solution Place 1 drop into both eyes daily.     Marland Kitchen levothyroxine (SYNTHROID) 75 MCG tablet Take 1 tablet (75 mcg total) by mouth daily before breakfast. 90 tablet 1  . lovastatin (MEVACOR) 40 MG tablet Take 1 tablet (40 mg total) by mouth at bedtime. 90 tablet 1  . Melatonin 3 MG TBDP Take 3-6 mg by mouth at bedtime as needed. 60 tablet 1  . ondansetron (ZOFRAN) 8 MG tablet Take 1 tablet (8 mg total) by mouth every 8 (eight) hours as needed for nausea or vomiting. 90 tablet 1  . OXcarbazepine (TRILEPTAL) 150 MG tablet Take 1 tablet (150 mg total) by mouth 2 (two) times daily. 180 tablet 3  . phenazopyridine (PYRIDIUM) 200 MG tablet Take 1 tablet by mouth 3 (three) times daily.    . rivaroxaban (XARELTO) 20 MG TABS tablet TAKE 1 TABLET DAILY WITH SUPPER 90 tablet 3  . silver sulfADIAZINE (SILVADENE) 1 % cream Apply 1 application topically daily. 400 g 0  . sulfamethoxazole-trimethoprim (BACTRIM DS) 800-160 MG tablet Take 1 tablet by mouth 2 (two) times daily. 14 tablet 0  . temozolomide (TEMODAR) 140 MG capsule TAKE 2 CAPSULES (280MG ) BY MOUTH DAILY FOR 5 DAYS,ON DAYS 10-14 OF EACH 28DAY CYCLE. MAY TAKE ON EMPTY STOMACH AT BEDTIME TO DECREASE NAUSEA & VOMITING 10 capsule 2   No current facility-administered medications for this visit.    SURGICAL HISTORY:  Past Surgical History:  Procedure Laterality Date  . APPENDECTOMY  86 years old  . COLONOSCOPY  2006,  2009  . COLONOSCOPY WITH PROPOFOL N/A 03/28/2016   Procedure: COLONOSCOPY WITH PROPOFOL;  Surgeon: Gatha Mayer, MD;  Location: [facility];  Service: Endoscopy;  Laterality: N/A;  . CORONARY ANGIOGRAPHY N/A 05/22/2018   Procedure: CORONARY ANGIOGRAPHY (CATH LAB);  Surgeon: Lorretta Harp, MD;  Location: [facility];  Service: Cardiovascular;  Laterality: N/A;  . cyst removed  35 years ago  . EP IMPLANTABLE DEVICE N/A 01/06/2015   Procedure: Loop Recorder Insertion;  Surgeon: Thompson Grayer, MD;  Location: [facility];  Service: Cardiovascular;  Laterality: N/A;  . EYE SURGERY Right    Cataract  . IR ANGIOGRAM SELECTIVE EACH ADDITIONAL VESSEL  11/28/2016  . IR ANGIOGRAM  SELECTIVE EACH ADDITIONAL VESSEL  11/28/2016  . IR ANGIOGRAM SELECTIVE EACH ADDITIONAL VESSEL  11/28/2016  . IR ANGIOGRAM SELECTIVE EACH ADDITIONAL VESSEL  11/28/2016  . IR ANGIOGRAM SELECTIVE EACH ADDITIONAL VESSEL  12/13/2016  . IR ANGIOGRAM SELECTIVE EACH ADDITIONAL VESSEL  12/13/2016  . IR ANGIOGRAM SELECTIVE EACH ADDITIONAL VESSEL  01/09/2017  . IR ANGIOGRAM VISCERAL SELECTIVE  11/28/2016  . IR ANGIOGRAM VISCERAL SELECTIVE  11/28/2016  . IR ANGIOGRAM VISCERAL SELECTIVE  12/13/2016  . IR ANGIOGRAM VISCERAL SELECTIVE  12/13/2016  . IR ANGIOGRAM VISCERAL SELECTIVE  01/09/2017  . IR EMBO ARTERIAL NOT HEMORR HEMANG INC GUIDE ROADMAPPING  11/28/2016  . IR EMBO TUMOR ORGAN ISCHEMIA INFARCT INC GUIDE ROADMAPPING  12/13/2016  . IR EMBO TUMOR ORGAN ISCHEMIA INFARCT INC GUIDE ROADMAPPING  01/09/2017  . IR IVC FILTER PLMT / S&I /IMG GUID/MOD SED  04/24/2017  . IR RADIOLOGIST EVAL & MGMT  11/06/2016  . IR RADIOLOGIST EVAL & MGMT  01/02/2017  . IR RADIOLOGIST EVAL & MGMT  02/05/2017  . IR RADIOLOGIST EVAL & MGMT  05/02/2017  . IR RADIOLOGIST EVAL & MGMT  10/17/2017  . IR US GUIDE VASC ACCESS RIGHT  11/28/2016  . IR US GUIDE VASC ACCESS RIGHT  12/13/2016  . IR US GUIDE VASC ACCESS RIGHT  01/09/2017  . KNEE ARTHROSCOPY Right 11/14/2006  . KNEE  ARTHROSCOPY Bilateral 5 and 6 years ago  . KNEE ARTHROSCOPY WITH LATERAL MENISECTOMY  07/03/2012   Procedure: KNEE ARTHROSCOPY WITH LATERAL MENISECTOMY;  Surgeon: Magnus Sinning, MD;  Location: [facility];  Service: Orthopedics;  Laterality: Left;  with Partial Lateral Menisectomy and Medial Menisectomy. Shaving of medial and lateral femoral condyles. Shaving of patella. Removal of a loose body  . LOOP RECORDER REMOVAL N/A 05/22/2018   Procedure: LOOP RECORDER REMOVAL;  Surgeon: Thompson Grayer, MD;  Location: [facility];  Service: Cardiovascular;  Laterality: N/A;  . PACEMAKER IMPLANT N/A 05/22/2018   MDT Azure XT DR MRI implanted by Dr Rayann Heman for sick sinus syndrome  . tibial and fibular internal fixation Left   . TOTAL ABDOMINAL HYSTERECTOMY  86 years old  . UPPER GASTROINTESTINAL ENDOSCOPY  2009, 2013    REVIEW OF SYSTEMS:  Constitutional: positive for fatigue Eyes: negative Ears, nose, mouth, throat, and face: negative Respiratory: positive for cough Cardiovascular: negative Gastrointestinal: negative Genitourinary:negative Integument/breast: negative Hematologic/lymphatic: negative Musculoskeletal:positive for muscle weakness Neurological: negative Behavioral/Psych: negative Endocrine: negative Allergic/Immunologic: negative   PHYSICAL EXAMINATION: General appearance: alert, cooperative, fatigued and no distress Head: Normocephalic, without obvious abnormality, atraumatic Neck: no adenopathy, no JVD, supple, symmetrical, trachea midline and thyroid not enlarged, symmetric, no tenderness/mass/nodules Lymph nodes: Cervical, supraclavicular, and axillary nodes normal. Resp: clear to auscultation bilaterally Back: symmetric, no curvature. ROM normal. No CVA tenderness. Cardio: regular rate and rhythm, S1, S2 normal, no murmur, click, rub or gallop GI: soft, non-tender; bowel sounds normal; no masses,  no organomegaly Extremities: extremities normal, atraumatic, no cyanosis or  edema Neurologic: Alert and oriented X 3, normal strength and tone. Normal symmetric reflexes. Normal coordination and gait  ECOG PERFORMANCE STATUS: 2 - Symptomatic, <50% confined to bed  Blood pressure (!) 142/64, pulse 60, temperature 98 F (36.7 C), temperature source Temporal, resp. rate 17, height 5\' 4"  (1.626 m), weight 211 lb 8 oz (95.9 kg), SpO2 100 %.  LABORATORY DATA: Lab Results  Component Value Date   WBC 2.2 (L) 08/18/2019   HGB 8.3 (L) 08/18/2019   HCT 27.5 (L) 08/18/2019   MCV 97.2  08/18/2019   PLT 84 (L) 08/18/2019      Chemistry      Component Value Date/Time   NA 132 (L) 07/06/2019 1313   NA 140 10/10/2018 1548   NA 135 (L) 07/10/2017 1142   K 4.2 07/06/2019 1313   K 4.6 07/10/2017 1142   CL 100 07/06/2019 1313   CO2 25 07/06/2019 1313   CO2 25 07/10/2017 1142   BUN 14 07/06/2019 1313   BUN 14 10/10/2018 1548   BUN 11.4 07/10/2017 1142   CREATININE 0.92 07/06/2019 1313   CREATININE 1.0 07/10/2017 1142      Component Value Date/Time   CALCIUM 9.2 07/06/2019 1313   CALCIUM 9.9 07/10/2017 1142   ALKPHOS 146 (H) 07/06/2019 1313   ALKPHOS 93 07/10/2017 1142   AST 26 07/06/2019 1313   AST 43 (H) 07/10/2017 1142   ALT 14 07/06/2019 1313   ALT 28 07/10/2017 1142   BILITOT 0.5 07/06/2019 1313   BILITOT 0.56 07/10/2017 1142       RADIOGRAPHIC STUDIES: No results found.  ASSESSMENT AND PLAN:  This is a very pleasant 86  years old white female with metastatic intermediate grade neuroendocrine carcinoma of questionable lung primary and multiple metastatic liver lesions and pancreatic lesions.She status post treatment with radio embolization with Y90 to the left and right lobe liver lesions.Status post treatment with Y 90 to the left and right lobes of the liver. She is currently undergoing systemic chemotherapy with Xeloda and Temodar status post 40 cycles.   She has been tolerating this treatment well.  She is expected to start cycle #41 on August 22, 2018. I recommended for the patient to continue with her treatment as planned. For the persistent anemia, the patient denied having any bleeding issues except for the ecchymosis in the upper extremities.  I will arrange for the patient to receive 2 units of PRBCs transfusion in the next few days.  She will also continue on the oral iron tablets. For hypertension she was advised to take her blood pressure medications as prescribed and to monitor it closely at home. The patient will come back for follow-up visit in 6 weeks for evaluation with repeat blood work. She was advised to call immediately if she has any concerning symptoms in the interval. The patient voices understanding of current disease status and treatment options and is in agreement with the current care plan. All questions were answered. The patient knows to call the clinic with any problems, questions or concerns. We can certainly see the patient much sooner if necessary.  Disclaimer: This note was dictated with voice recognition software. Similar sounding words can inadvertently be transcribed and may not be corrected upon review.

## 2019-08-19 ENCOUNTER — Telehealth: Payer: Self-pay | Admitting: Internal Medicine

## 2019-08-19 LAB — PREPARE RBC (CROSSMATCH)

## 2019-08-19 LAB — ABO/RH: ABO/RH(D): A POS

## 2019-08-19 NOTE — Telephone Encounter (Signed)
Scheduled per 1/19 los. Called and spoke with pt, confirmed 3/2 appt

## 2019-08-21 ENCOUNTER — Inpatient Hospital Stay: Payer: Medicare Other

## 2019-08-21 ENCOUNTER — Other Ambulatory Visit: Payer: Self-pay | Admitting: Physician Assistant

## 2019-08-21 ENCOUNTER — Other Ambulatory Visit: Payer: Self-pay

## 2019-08-21 ENCOUNTER — Telehealth: Payer: Self-pay | Admitting: Internal Medicine

## 2019-08-21 VITALS — BP 119/45 | HR 59 | Temp 98.1°F | Resp 18

## 2019-08-21 DIAGNOSIS — C7A8 Other malignant neuroendocrine tumors: Secondary | ICD-10-CM | POA: Diagnosis not present

## 2019-08-21 DIAGNOSIS — Z86711 Personal history of pulmonary embolism: Secondary | ICD-10-CM | POA: Diagnosis not present

## 2019-08-21 DIAGNOSIS — D61818 Other pancytopenia: Secondary | ICD-10-CM | POA: Diagnosis not present

## 2019-08-21 DIAGNOSIS — C7A090 Malignant carcinoid tumor of the bronchus and lung: Secondary | ICD-10-CM | POA: Diagnosis not present

## 2019-08-21 DIAGNOSIS — Z7901 Long term (current) use of anticoagulants: Secondary | ICD-10-CM | POA: Diagnosis not present

## 2019-08-21 DIAGNOSIS — D539 Nutritional anemia, unspecified: Secondary | ICD-10-CM

## 2019-08-21 DIAGNOSIS — C787 Secondary malignant neoplasm of liver and intrahepatic bile duct: Secondary | ICD-10-CM | POA: Diagnosis not present

## 2019-08-21 MED ORDER — SODIUM CHLORIDE 0.9% IV SOLUTION
250.0000 mL | Freq: Once | INTRAVENOUS | Status: AC
  Administered 2019-08-21: 13:00:00 250 mL via INTRAVENOUS
  Filled 2019-08-21: qty 250

## 2019-08-21 MED ORDER — ACETAMINOPHEN 325 MG PO TABS
650.0000 mg | ORAL_TABLET | Freq: Once | ORAL | Status: AC
  Administered 2019-08-21: 13:00:00 650 mg via ORAL

## 2019-08-21 MED ORDER — DIPHENHYDRAMINE HCL 25 MG PO CAPS
25.0000 mg | ORAL_CAPSULE | Freq: Once | ORAL | Status: AC
  Administered 2019-08-21: 25 mg via ORAL

## 2019-08-21 MED ORDER — DIPHENHYDRAMINE HCL 25 MG PO CAPS
ORAL_CAPSULE | ORAL | Status: AC
  Filled 2019-08-21: qty 1

## 2019-08-21 MED ORDER — ACETAMINOPHEN 325 MG PO TABS
ORAL_TABLET | ORAL | Status: AC
  Filled 2019-08-21: qty 2

## 2019-08-21 NOTE — Patient Instructions (Signed)
Anemia  Anemia is a condition in which you do not have enough red blood cells or hemoglobin. Hemoglobin is a substance in red blood cells that carries oxygen. When you do not have enough red blood cells or hemoglobin (are anemic), your body cannot get enough oxygen and your organs may not work properly. As a result, you may feel very tired or have other problems. What are the causes? Common causes of anemia include:  Excessive bleeding. Anemia can be caused by excessive bleeding inside or outside the body, including bleeding from the intestine or from periods in women.  Poor nutrition.  Long-lasting (chronic) kidney, thyroid, and liver disease.  Bone marrow disorders.  Cancer and treatments for cancer.  HIV (human immunodeficiency virus) and AIDS (acquired immunodeficiency syndrome).  Treatments for HIV and AIDS.  Spleen problems.  Blood disorders.  Infections, medicines, and autoimmune disorders that destroy red blood cells. What are the signs or symptoms? Symptoms of this condition include:  Minor weakness.  Dizziness.  Headache.  Feeling heartbeats that are irregular or faster than normal (palpitations).  Shortness of breath, especially with exercise.  Paleness.  Cold sensitivity.  Indigestion.  Nausea.  Difficulty sleeping.  Difficulty concentrating. Symptoms may occur suddenly or develop slowly. If your anemia is mild, you may not have symptoms. How is this diagnosed? This condition is diagnosed based on:  Blood tests.  Your medical history.  A physical exam.  Bone marrow biopsy. Your health care provider may also check your stool (feces) for blood and may do additional testing to look for the cause of your bleeding. You may also have other tests, including:  Imaging tests, such as a CT scan or MRI.  Endoscopy.  Colonoscopy. How is this treated? Treatment for this condition depends on the cause. If you continue to lose a lot of blood, you may  need to be treated at a hospital. Treatment may include:  Taking supplements of iron, vitamin S31, or folic acid.  Taking a hormone medicine (erythropoietin) that can help to stimulate red blood cell growth.  Having a blood transfusion. This may be needed if you lose a lot of blood.  Making changes to your diet.  Having surgery to remove your spleen. Follow these instructions at home:  Take over-the-counter and prescription medicines only as told by your health care provider.  Take supplements only as told by your health care provider.  Follow any diet instructions that you were given.  Keep all follow-up visits as told by your health care provider. This is important. Contact a health care provider if:  You develop new bleeding anywhere in the body. Get help right away if:  You are very weak.  You are short of breath.  You have pain in your abdomen or chest.  You are dizzy or feel faint.  You have trouble concentrating.  You have bloody or black, tarry stools.  You vomit repeatedly or you vomit up blood. Summary  Anemia is a condition in which you do not have enough red blood cells or enough of a substance in your red blood cells that carries oxygen (hemoglobin).  Symptoms may occur suddenly or develop slowly.  If your anemia is mild, you may not have symptoms.  This condition is diagnosed with blood tests as well as a medical history and physical exam. Other tests may be needed.  Treatment for this condition depends on the cause of the anemia. This information is not intended to replace advice given to you by  your health care provider. Make sure you discuss any questions you have with your health care provider. Document Revised: 06/28/2017 Document Reviewed: 08/17/2016 Elsevier Patient Education  [place].

## 2019-08-21 NOTE — Telephone Encounter (Signed)
I talk with patient regarding 1/27

## 2019-08-21 NOTE — Progress Notes (Signed)
Pt difficult IV start. IV placed by IV team. When trying to start PRBC infusion, this RN noticed the patient's IV would not flush. Upon further assessment IV catheter noted to be kinked. IV removed (see flowsheets). No blood was administered to the patient. Attempted IV restart by Glade Nurse, RN with no success. [name] Heillingeopter, PA aware. Per [name] Heilingeopter, PA no more IV attempts today, pt to come back for lab appointment next week. Pt verbalizes understanding and agrees with plan of care. Pt verbalizes understanding that she will receive telephone call to schedule lab appointment at [facility]. Pt will call [facility] with any further questions/concerns.

## 2019-08-22 LAB — BPAM RBC
Blood Product Expiration Date: 202102032359
Blood Product Expiration Date: 202102032359
ISSUE DATE / TIME: 202101221300
ISSUE DATE / TIME: 202101221300
Unit Type and Rh: 6200
Unit Type and Rh: 6200

## 2019-08-22 LAB — TYPE AND SCREEN
ABO/RH(D): A POS
Antibody Screen: NEGATIVE
Unit division: 0
Unit division: 0

## 2019-08-26 ENCOUNTER — Other Ambulatory Visit: Payer: Self-pay | Admitting: *Deleted

## 2019-08-26 ENCOUNTER — Telehealth: Payer: Self-pay | Admitting: *Deleted

## 2019-08-26 ENCOUNTER — Other Ambulatory Visit: Payer: Self-pay

## 2019-08-26 ENCOUNTER — Inpatient Hospital Stay: Payer: Medicare Other

## 2019-08-26 ENCOUNTER — Other Ambulatory Visit: Payer: Self-pay | Admitting: Physician Assistant

## 2019-08-26 DIAGNOSIS — Z86711 Personal history of pulmonary embolism: Secondary | ICD-10-CM | POA: Diagnosis not present

## 2019-08-26 DIAGNOSIS — C7A090 Malignant carcinoid tumor of the bronchus and lung: Secondary | ICD-10-CM | POA: Diagnosis not present

## 2019-08-26 DIAGNOSIS — I878 Other specified disorders of veins: Secondary | ICD-10-CM

## 2019-08-26 DIAGNOSIS — C787 Secondary malignant neoplasm of liver and intrahepatic bile duct: Secondary | ICD-10-CM | POA: Diagnosis not present

## 2019-08-26 DIAGNOSIS — D61818 Other pancytopenia: Secondary | ICD-10-CM | POA: Diagnosis not present

## 2019-08-26 DIAGNOSIS — D539 Nutritional anemia, unspecified: Secondary | ICD-10-CM

## 2019-08-26 DIAGNOSIS — Z7901 Long term (current) use of anticoagulants: Secondary | ICD-10-CM | POA: Diagnosis not present

## 2019-08-26 DIAGNOSIS — D649 Anemia, unspecified: Secondary | ICD-10-CM

## 2019-08-26 DIAGNOSIS — C7A8 Other malignant neuroendocrine tumors: Secondary | ICD-10-CM | POA: Diagnosis not present

## 2019-08-26 LAB — CBC WITH DIFFERENTIAL (CANCER CENTER ONLY)
Abs Immature Granulocytes: 0.06 10*3/uL (ref 0.00–0.07)
Basophils Absolute: 0 10*3/uL (ref 0.0–0.1)
Basophils Relative: 2 %
Eosinophils Absolute: 0.1 10*3/uL (ref 0.0–0.5)
Eosinophils Relative: 6 %
HCT: 26 % — ABNORMAL LOW (ref 36.0–46.0)
Hemoglobin: 8 g/dL — ABNORMAL LOW (ref 12.0–15.0)
Immature Granulocytes: 2 %
Lymphocytes Relative: 22 %
Lymphs Abs: 0.6 10*3/uL — ABNORMAL LOW (ref 0.7–4.0)
MCH: 28.4 pg (ref 26.0–34.0)
MCHC: 30.8 g/dL (ref 30.0–36.0)
MCV: 92.2 fL (ref 80.0–100.0)
Monocytes Absolute: 0.4 10*3/uL (ref 0.1–1.0)
Monocytes Relative: 17 %
Neutro Abs: 1.3 10*3/uL — ABNORMAL LOW (ref 1.7–7.7)
Neutrophils Relative %: 51 %
Platelet Count: 77 10*3/uL — ABNORMAL LOW (ref 150–400)
RBC: 2.82 MIL/uL — ABNORMAL LOW (ref 3.87–5.11)
RDW: 21.7 % — ABNORMAL HIGH (ref 11.5–15.5)
WBC Count: 2.5 10*3/uL — ABNORMAL LOW (ref 4.0–10.5)
nRBC: 0 % (ref 0.0–0.2)

## 2019-08-26 NOTE — Telephone Encounter (Signed)
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   So her story is that she came in for blood on Friday for symptomatic anemia and they could not get IV access even with IV team. She has symptomatic anemia. We rechecked her CBC today and it dropped a little bit more. I put an order in for a PICC line. Would you be able to call the patient and let her know that her hemoglobin continues to be low and that since she is symptomatic, we are arranging for her to have a minor surgical procedure performed so they are able to give her blood and to be aware/on the look out from a phone call from scheduling to do that. Once that procedure has been performed, we will reach out to her to arrange for 2 units of blood.   ----- Message ----- From: Curt Bears, MD Sent: 08/26/2019   1:15 PM EST To: Tobe Sos Heilingoetter, PA-C  She definitely need blood transfusion.  If needed we can put a midline or PICC line.  Thank you. ----- Message ----- From: Heilingoetter, Tobe Sos, PA-C Sent: 08/26/2019  11:17 AM EST To: Curt Bears, MD  She is the one who they could not get an IV on her even with IV team. Here is her repeat CBC. Do we need to arrange for her to get a PICC?  ----- Message ----- From: Interface, Lab In [place] Sent: 08/26/2019  11:11 AM EST To: Cassandra L Heilingoetter, PA-C

## 2019-08-26 NOTE — Telephone Encounter (Signed)
Notified of message below. States they have already scheduled for the PICC on Friday at 1000.

## 2019-08-27 ENCOUNTER — Telehealth: Payer: Self-pay | Admitting: Medical Oncology

## 2019-08-27 ENCOUNTER — Other Ambulatory Visit: Payer: Self-pay | Admitting: Physician Assistant

## 2019-08-27 ENCOUNTER — Telehealth: Payer: Self-pay | Admitting: Physician Assistant

## 2019-08-27 DIAGNOSIS — D539 Nutritional anemia, unspecified: Secondary | ICD-10-CM

## 2019-08-27 NOTE — Telephone Encounter (Signed)
Cataract surgery cancelled for today . R/S for 2/18. Caryl Pina will send fax for Dr [name].

## 2019-08-27 NOTE — Telephone Encounter (Signed)
Scheduled appt per 1/27 sch message - pt husband aware of appts added.

## 2019-08-28 ENCOUNTER — Ambulatory Visit (HOSPITAL_COMMUNITY)
Admission: RE | Admit: 2019-08-28 | Discharge: 2019-08-28 | Disposition: A | Discharge status: Home / Self Care | Payer: Medicare Other | Source: Ambulatory Visit | Attending: Physician Assistant | Admitting: Physician Assistant

## 2019-08-28 ENCOUNTER — Other Ambulatory Visit: Payer: Self-pay | Admitting: *Deleted

## 2019-08-28 ENCOUNTER — Telehealth: Payer: Self-pay

## 2019-08-28 ENCOUNTER — Other Ambulatory Visit: Payer: Self-pay

## 2019-08-28 ENCOUNTER — Inpatient Hospital Stay: Payer: Medicare Other

## 2019-08-28 ENCOUNTER — Other Ambulatory Visit: Payer: Self-pay | Admitting: Physician Assistant

## 2019-08-28 DIAGNOSIS — D649 Anemia, unspecified: Secondary | ICD-10-CM | POA: Diagnosis not present

## 2019-08-28 DIAGNOSIS — C7A8 Other malignant neuroendocrine tumors: Secondary | ICD-10-CM | POA: Diagnosis not present

## 2019-08-28 DIAGNOSIS — D539 Nutritional anemia, unspecified: Secondary | ICD-10-CM

## 2019-08-28 DIAGNOSIS — I878 Other specified disorders of veins: Secondary | ICD-10-CM

## 2019-08-28 DIAGNOSIS — Z452 Encounter for adjustment and management of vascular access device: Secondary | ICD-10-CM | POA: Diagnosis not present

## 2019-08-28 DIAGNOSIS — C787 Secondary malignant neoplasm of liver and intrahepatic bile duct: Secondary | ICD-10-CM | POA: Diagnosis not present

## 2019-08-28 DIAGNOSIS — C3490 Malignant neoplasm of unspecified part of unspecified bronchus or lung: Secondary | ICD-10-CM | POA: Diagnosis not present

## 2019-08-28 DIAGNOSIS — C7A090 Malignant carcinoid tumor of the bronchus and lung: Secondary | ICD-10-CM | POA: Diagnosis not present

## 2019-08-28 DIAGNOSIS — Z7901 Long term (current) use of anticoagulants: Secondary | ICD-10-CM | POA: Diagnosis not present

## 2019-08-28 DIAGNOSIS — Z86711 Personal history of pulmonary embolism: Secondary | ICD-10-CM | POA: Diagnosis not present

## 2019-08-28 DIAGNOSIS — C3492 Malignant neoplasm of unspecified part of left bronchus or lung: Secondary | ICD-10-CM

## 2019-08-28 DIAGNOSIS — D61818 Other pancytopenia: Secondary | ICD-10-CM | POA: Diagnosis not present

## 2019-08-28 DIAGNOSIS — Z95828 Presence of other vascular implants and grafts: Secondary | ICD-10-CM | POA: Insufficient documentation

## 2019-08-28 LAB — PREPARE RBC (CROSSMATCH)

## 2019-08-28 MED ORDER — HEPARIN SOD (PORK) LOCK FLUSH 100 UNIT/ML IV SOLN
INTRAVENOUS | Status: AC
  Filled 2019-08-28: qty 5

## 2019-08-28 MED ORDER — LIDOCAINE HCL 1 % IJ SOLN
INTRAMUSCULAR | Status: AC
  Filled 2019-08-28: qty 20

## 2019-08-28 MED ORDER — HEPARIN SOD (PORK) LOCK FLUSH 100 UNIT/ML IV SOLN
500.0000 [IU] | Freq: Once | INTRAVENOUS | Status: AC
  Administered 2019-08-28: 250 [IU]
  Filled 2019-08-28: qty 5

## 2019-08-28 MED ORDER — SODIUM CHLORIDE 0.9% FLUSH
10.0000 mL | Freq: Once | INTRAVENOUS | Status: AC
  Administered 2019-08-28: 10 mL
  Filled 2019-08-28: qty 10

## 2019-08-28 NOTE — Procedures (Signed)
Access for blood transfusions  S/p RUE SL POWER PICC  Tip svcra No comp Stable ebl 0 Ready for use Full report in pacs

## 2019-08-28 NOTE — Telephone Encounter (Signed)
Per [name] H PA-C TC to Pt to inform her that she will get the PICC line removed after her Blood transfusion. Spoke with Pt's husband who stated that they could come on Monday at 1 pm to get the PICC line removed. Pt.'s husband  stated he thought the PICC line was going to stay. Informed him if it stayed she would have to come in 3 x a week to get it flushed. Pt's husband verbalized understanding. Stated they will be at appointment on Monday.

## 2019-08-29 ENCOUNTER — Inpatient Hospital Stay: Payer: Medicare Other

## 2019-08-29 ENCOUNTER — Other Ambulatory Visit: Payer: Self-pay

## 2019-08-29 DIAGNOSIS — C7A090 Malignant carcinoid tumor of the bronchus and lung: Secondary | ICD-10-CM | POA: Diagnosis not present

## 2019-08-29 DIAGNOSIS — D61818 Other pancytopenia: Secondary | ICD-10-CM | POA: Diagnosis not present

## 2019-08-29 DIAGNOSIS — D539 Nutritional anemia, unspecified: Secondary | ICD-10-CM

## 2019-08-29 DIAGNOSIS — C787 Secondary malignant neoplasm of liver and intrahepatic bile duct: Secondary | ICD-10-CM | POA: Diagnosis not present

## 2019-08-29 DIAGNOSIS — Z7901 Long term (current) use of anticoagulants: Secondary | ICD-10-CM | POA: Diagnosis not present

## 2019-08-29 DIAGNOSIS — C7A8 Other malignant neuroendocrine tumors: Secondary | ICD-10-CM | POA: Diagnosis not present

## 2019-08-29 DIAGNOSIS — Z86711 Personal history of pulmonary embolism: Secondary | ICD-10-CM | POA: Diagnosis not present

## 2019-08-29 MED ORDER — DIPHENHYDRAMINE HCL 25 MG PO CAPS
25.0000 mg | ORAL_CAPSULE | Freq: Once | ORAL | Status: AC
  Administered 2019-08-29: 25 mg via ORAL

## 2019-08-29 MED ORDER — ACETAMINOPHEN 325 MG PO TABS
ORAL_TABLET | ORAL | Status: AC
  Filled 2019-08-29: qty 1

## 2019-08-29 MED ORDER — DIPHENHYDRAMINE HCL 25 MG PO CAPS
ORAL_CAPSULE | ORAL | Status: AC
  Filled 2019-08-29: qty 1

## 2019-08-29 MED ORDER — SODIUM CHLORIDE 0.9% FLUSH
3.0000 mL | INTRAVENOUS | Status: AC | PRN
  Administered 2019-08-29: 3 mL
  Filled 2019-08-29: qty 10

## 2019-08-29 MED ORDER — ACETAMINOPHEN 325 MG PO TABS
650.0000 mg | ORAL_TABLET | Freq: Once | ORAL | Status: AC
  Administered 2019-08-29: 650 mg via ORAL

## 2019-08-29 MED ORDER — HEPARIN SOD (PORK) LOCK FLUSH 100 UNIT/ML IV SOLN
250.0000 [IU] | INTRAVENOUS | Status: AC | PRN
  Administered 2019-08-29: 250 [IU]
  Filled 2019-08-29: qty 5

## 2019-08-29 MED ORDER — SODIUM CHLORIDE 0.9% IV SOLUTION
250.0000 mL | Freq: Once | INTRAVENOUS | Status: AC
  Administered 2019-08-29: 250 mL via INTRAVENOUS
  Filled 2019-08-29: qty 250

## 2019-08-29 NOTE — Patient Instructions (Signed)

## 2019-08-31 ENCOUNTER — Ambulatory Visit (INDEPENDENT_AMBULATORY_CARE_PROVIDER_SITE_OTHER): Payer: Medicare Other | Admitting: *Deleted

## 2019-08-31 ENCOUNTER — Telehealth: Payer: Self-pay

## 2019-08-31 ENCOUNTER — Other Ambulatory Visit: Payer: Self-pay

## 2019-08-31 ENCOUNTER — Other Ambulatory Visit: Payer: Self-pay | Admitting: Physician Assistant

## 2019-08-31 ENCOUNTER — Inpatient Hospital Stay: Payer: Medicare Other | Attending: Internal Medicine

## 2019-08-31 DIAGNOSIS — I495 Sick sinus syndrome: Secondary | ICD-10-CM

## 2019-08-31 DIAGNOSIS — D539 Nutritional anemia, unspecified: Secondary | ICD-10-CM

## 2019-08-31 DIAGNOSIS — C7A8 Other malignant neuroendocrine tumors: Secondary | ICD-10-CM

## 2019-08-31 DIAGNOSIS — C7B8 Other secondary neuroendocrine tumors: Secondary | ICD-10-CM

## 2019-08-31 LAB — TYPE AND SCREEN
ABO/RH(D): A POS
Antibody Screen: NEGATIVE
Unit division: 0
Unit division: 0

## 2019-08-31 LAB — CUP PACEART REMOTE DEVICE CHECK
Battery Remaining Longevity: 153 mo
Battery Voltage: 3.04 V
Brady Statistic AP VP Percent: 0.36 %
Brady Statistic AP VS Percent: 97.77 %
Brady Statistic AS VP Percent: 0.01 %
Brady Statistic AS VS Percent: 1.87 %
Brady Statistic RA Percent Paced: 98.18 %
Brady Statistic RV Percent Paced: 0.36 %
Date Time Interrogation Session: 20210201001745
Implantable Lead Implant Date: 20191024
Implantable Lead Implant Date: 20191024
Implantable Lead Location: 753859
Implantable Lead Location: 753860
Implantable Lead Model: 5076
Implantable Lead Model: 5076
Implantable Pulse Generator Implant Date: 20191024
Lead Channel Impedance Value: 285 Ohm
Lead Channel Impedance Value: 304 Ohm
Lead Channel Impedance Value: 361 Ohm
Lead Channel Impedance Value: 513 Ohm
Lead Channel Pacing Threshold Amplitude: 0.75 V
Lead Channel Pacing Threshold Amplitude: 1.125 V
Lead Channel Pacing Threshold Pulse Width: 0.4 ms
Lead Channel Pacing Threshold Pulse Width: 0.4 ms
Lead Channel Sensing Intrinsic Amplitude: 1.875 mV
Lead Channel Sensing Intrinsic Amplitude: 1.875 mV
Lead Channel Sensing Intrinsic Amplitude: 11.875 mV
Lead Channel Sensing Intrinsic Amplitude: 11.875 mV
Lead Channel Setting Pacing Amplitude: 1.5 V
Lead Channel Setting Pacing Amplitude: 2.25 V
Lead Channel Setting Pacing Pulse Width: 0.4 ms
Lead Channel Setting Sensing Sensitivity: 1.2 mV

## 2019-08-31 LAB — BPAM RBC
Blood Product Expiration Date: 202102152359
Blood Product Expiration Date: 202102152359
ISSUE DATE / TIME: 202101300932
ISSUE DATE / TIME: 202101300932
Unit Type and Rh: 6200
Unit Type and Rh: 6200

## 2019-08-31 MED ORDER — HEPARIN SOD (PORK) LOCK FLUSH 100 UNIT/ML IV SOLN
500.0000 [IU] | Freq: Every day | INTRAVENOUS | Status: AC | PRN
  Filled 2019-08-31: qty 5

## 2019-08-31 NOTE — Telephone Encounter (Signed)
Per Cassandra Heilingoepter, PA, ok to removed PICC today.

## 2019-08-31 NOTE — Progress Notes (Unsigned)
1425  -  Right UE single power PICC catheter d/c intact with one gentle pull.  Catheter measured 39 cm.  Vaseline gauze applied to site.  Occlusive drsg applied.  Pt to lie flat in bed for 30 min.  Pt tolerated procedure without difficulty. 1520 -   Pt was stable at discharge.  Explanations given to husband regarding PICC removal site care at home for 24 hours.  Spouse voiced understanding.

## 2019-08-31 NOTE — Patient Instructions (Signed)
PICC Removal, Adult, Care After This sheet gives you information about how to care for yourself after your procedure. Your health care provider may also give you more specific instructions. If you have problems or questions, contact your health care provider. What can I expect after the procedure? After your procedure, it is common to have:  Tenderness or soreness.  Redness, swelling, or a scab where the PICC was removed (exit site). Follow these instructions at home: For the first 24 hours after the procedure   Keep the bandage (dressing) on the exit site clean and dry. Do not remove the dressing until your health care provider tells you to do so.  Check your arm often for signs and symptoms of an infection. Check for: ? A red streak that spreads away from the dressing. ? Blood or fluid that you can see on the dressing. ? More redness or swelling.  Do not lift anything heavy or do activities that require great effort until your health care provider says it is okay. You should avoid: ? Lifting weights. ? Yard work. ? Any physical activity with repetitive arm movement.  Watch closely for any signs of an air bubble in the vein (air embolism). This is a rare but serious complication. If you have signs of air embolism, call 911 immediately and lie down on your left side to keep the air from moving into the lungs. Signs of an air embolism include: ? Difficulty breathing. ? Chest pain. ? Coughing or wheezing. ? Skin that is pale, blue, cold, or clammy. ? Rapid pulse. ? Rapid breathing. ? Fainting. After 24 Hours have passed:  Remove your dressing as told by your health care provider. Make sure you wash your hands with soap and water before and after you change the dressing. If soap and water are not available, use hand sanitizer.  Return to your normal activities as told by your health care provider.  A small scab may develop over the exit site. Do not pick at the scab.  When  bathing or showering, gently wash the exit site with soap and water. Pat it dry.  Watch for signs of infection, such as: ? Fever or chills. ? Swollen glands under the arm. ? More redness, swelling, or soreness in the arm. ? Blood, fluid, or pus coming from the exit site. ? Warmth or a bad smell at the exit site. ? A red streak spreading away from the exit site. General instructions  Take over-the-counter and prescription medicines only as told by your health care provider. Do not take any new medicines without checking with your health care provider first.  If you were prescribed an antibiotic medicine, apply or take it as told by your health care provider. Do not stop using the antibiotic even if your condition improves.  Keep all follow-up visits as told by your health care provider. This is important. Contact a health care provider if:  You have a fever or chills.  You have soreness, redness, or swelling on your exit site, and it gets worse.  You have swollen glands under your arm.  You have any of the following symptoms at your exit site: ? Blood, fluid, or pus. ? Unusual warmth. ? A bad smell. ? A red streak spreading away from the exit site. Get help right away if:  You have numbness or tingling in your fingers, hand, or arm.  Your arm looks blue and feels cold.  You have signs of an air embolism, such  as: ? Difficulty breathing. ? Chest pain. ? Coughing or wheezing. ? Skin that is pale, blue, cold, or clammy. ? Rapid pulse. ? Rapid breathing. ? Fainting. These symptoms may represent a serious problem that is an emergency. Do not wait to see if the symptoms will go away. Get medical help right away. Call your local emergency services (911 in the [place] Do not drive yourself to the hospital. Summary  After your procedure, it is common to have tenderness or soreness, redness, swelling, or a scab at the exit site.  Keep the dressing over the exit site clean and dry.  Do not remove the dressing until your health care provider tells you to do so.  Do not lift anything heavy or do activities that require great effort until your health care provider says it is okay.  Watch closely for any signs of an air embolism. If you have signs of air embolism, call 911 immediately and lie down on your left side. This information is not intended to replace advice given to you by your health care provider. Make sure you discuss any questions you have with your health care provider. Document Revised: 06/28/2017 Document Reviewed: 09/11/2016 Elsevier Patient Education  2020 [employer].

## 2019-08-31 NOTE — Progress Notes (Signed)
PPM Remote  

## 2019-09-04 DIAGNOSIS — Z23 Encounter for immunization: Secondary | ICD-10-CM | POA: Diagnosis not present

## 2019-09-08 ENCOUNTER — Telehealth: Payer: Self-pay | Admitting: Physician Assistant

## 2019-09-08 NOTE — Telephone Encounter (Signed)
DPR ok to s/w pt's husband Audry Pili and said that it is fine that he accompanies his wife to her appt as she is in a wheelchair and has memory issues. I have noted the appt notes pt's husband ok to come to appt. Pt's husband thanked me for the call.

## 2019-09-08 NOTE — Telephone Encounter (Signed)
Patient's husband is calling requesting he attend the patients upcoming appointment scheduled for 09/11/19 due to her having memory issues and being in a wheelchair. Please advise.

## 2019-09-09 NOTE — Progress Notes (Signed)
Cardiology Office Note Date:  09/11/2019  Patient ID:  [name], [name] 1934-06-07, MRN [id number] PCP:  [name], MD  Cardiologist:  Dr. Gwenlyn Found Electrophysiologist: Dr. Rayann Heman    Chief Complaint: annual EP/device visit  History of Present Illness: [name] is a 86 y.o. female with history of HTN, AFib, h/o CVA/TIA, non-obstructive CAD, symptomatic bradycardia w/PPM, hypothyroidism, seizure d/o, cerebellar degeneration wheelchair bound, Neuroendocrine carcinoma lung w/mets to liver, DVT/PE w/IVC Filter, seizure d/o  She comes in today to be seen for Dr. Rayann Heman, last seen by him Jan 2020, she was feeling less fatigued post pacing with no recurrent near syncope. More recently saw Dr. Gwenlyn Found Sept 2020, he discussed mild AS, no plans for updating echo, given not felt to be a AVR candidate and was non-ambulatory.  She comes today accompanied by her husband.  The patient answers my questions though I do have difficulty in hearing/understanding her answers.  She is awake and alert and does talk with me.  Her usband helps to provide details of our visit From a cardiac perspective, she seems to be doing well, no cardiac awareness, no CP or palpitations She has ome baseline SOB with any exertional that I am told is her baseline and unchanged. No syncope  She follows closely with oncology, has been requiring recurrent blood transfusions, though no clear source of blood loss that her husband reports they are aware of.  He states that Dr. [name] has been clear that she has had a number of clotting events as well and has said she can not stop the xarelto. They have not observed any overt signs of bleeding.  The patient mentions frustrated at being so heavy, overweight.  .  Device information MDT dual chamber PPM, implanted 05/22/2018  Past Medical History:  Diagnosis Date  . Acute respiratory failure with hypoxia ([place]) 10/23/2015  . Allergic rhinitis    PT. DENIES  . Anxiety    . Aortic insufficiency    Echo 04/29/2018: EF 65-70, mild AS (mean 13), mod AI, Asc Aorta 42 mm (mildly dilated), mild LAE, PASP 41, pericardium normal in appearance.   . Arthritis    NECK  . Ataxia   . Back pain 12/13/2016  . Bradycardia    primarily nocturnal  . Burning tongue syndrome 25 years  . Cancer ([place]) dx'd 06/2016   liver  . Cataract   . Cerebellar degeneration ([place])   . Chest pain 12/13/2016   Atypical chest pain  . Chronic urinary tract infection   . Complication of anesthesia    low o2 sats, coded 30 years ago  . CVA (cerebral infarction) 05/2003  . Dehydration with hyponatremia 03/23/2018  . Depression   . Encounter for antineoplastic chemotherapy 10/09/2016  . Fatigue 01/03/2015  . Gait disorder   . Gastric polyps   . GERD (gastroesophageal reflux disease)   . Goals of care, counseling/discussion 10/09/2016  . H/O: CVA (cerebrovascular accident) 07/14/2016  . High cholesterol   . History of pericarditis   . Hyperlipidemia   . Hypotension   . Hypothyroidism   . IBS (irritable bowel syndrome)   . Neuroendocrine cancer ([place]) 08/23/2016  . Obesity   . Paroxysmal atrial fibrillation ([name])    chads2vasc score is 6,  she is felt to be a poor candidate for anticoagulation  . Pericarditis   . Personal history of arterial venous malformation (AVM)    right side of face  . Seizure disorder ([place])   . Seizures ([place])  2003   " smelling"- Gabapentin "no problem"  . Shortness of breath dyspnea    with exertion  . Sick sinus syndrome ([place])   . Sternum fx 10/27/2013  . Stroke [facility]) 5 years ago   Right side of face weak, slurred speach-   . Thyroid disease   . TIA (transient ischemic attack)   . UTI (lower urinary tract infection) 03/27/2016   "frequently"    Past Surgical History:  Procedure Laterality Date  . APPENDECTOMY  86 years old  . COLONOSCOPY  2006, 2009  . COLONOSCOPY WITH PROPOFOL N/A 03/28/2016   Procedure: COLONOSCOPY WITH PROPOFOL;  Surgeon: Gatha Mayer, MD;  Location: [place];  Service: Endoscopy;  Laterality: N/A;  . CORONARY ANGIOGRAPHY N/A 05/22/2018   Procedure: CORONARY ANGIOGRAPHY (CATH LAB);  Surgeon: Lorretta Harp, MD;  Location: [name] [facility];  Service: Cardiovascular;  Laterality: N/A;  . cyst removed  35 years ago  . EP IMPLANTABLE DEVICE N/A 01/06/2015   Procedure: Loop Recorder Insertion;  Surgeon: Thompson Grayer, MD;  Location: [place] CV [facility];  Service: Cardiovascular;  Laterality: N/A;  . EYE SURGERY Right    Cataract  . IR ANGIOGRAM SELECTIVE EACH ADDITIONAL VESSEL  11/28/2016  . IR ANGIOGRAM SELECTIVE EACH ADDITIONAL VESSEL  11/28/2016  . IR ANGIOGRAM SELECTIVE EACH ADDITIONAL VESSEL  11/28/2016  . IR ANGIOGRAM SELECTIVE EACH ADDITIONAL VESSEL  11/28/2016  . IR ANGIOGRAM SELECTIVE EACH ADDITIONAL VESSEL  12/13/2016  . IR ANGIOGRAM SELECTIVE EACH ADDITIONAL VESSEL  12/13/2016  . IR ANGIOGRAM SELECTIVE EACH ADDITIONAL VESSEL  01/09/2017  . IR ANGIOGRAM VISCERAL SELECTIVE  11/28/2016  . IR ANGIOGRAM VISCERAL SELECTIVE  11/28/2016  . IR ANGIOGRAM VISCERAL SELECTIVE  12/13/2016  . IR ANGIOGRAM VISCERAL SELECTIVE  12/13/2016  . IR ANGIOGRAM VISCERAL SELECTIVE  01/09/2017  . IR EMBO ARTERIAL NOT HEMORR HEMANG INC GUIDE ROADMAPPING  11/28/2016  . IR EMBO TUMOR ORGAN ISCHEMIA INFARCT INC GUIDE ROADMAPPING  12/13/2016  . IR EMBO TUMOR ORGAN ISCHEMIA INFARCT INC GUIDE ROADMAPPING  01/09/2017  . IR IVC FILTER PLMT / S&I /IMG GUID/MOD SED  04/24/2017  . IR RADIOLOGIST EVAL & MGMT  11/06/2016  . IR RADIOLOGIST EVAL & MGMT  01/02/2017  . IR RADIOLOGIST EVAL & MGMT  02/05/2017  . IR RADIOLOGIST EVAL & MGMT  05/02/2017  . IR RADIOLOGIST EVAL & MGMT  10/17/2017  . IR US GUIDE VASC ACCESS RIGHT  11/28/2016  . IR US GUIDE VASC ACCESS RIGHT  12/13/2016  . IR US GUIDE VASC ACCESS RIGHT  01/09/2017  . KNEE ARTHROSCOPY Right 11/14/2006  . KNEE ARTHROSCOPY Bilateral 5 and 6 years ago  . KNEE ARTHROSCOPY WITH LATERAL MENISECTOMY  07/03/2012    Procedure: KNEE ARTHROSCOPY WITH LATERAL MENISECTOMY;  Surgeon: Magnus Sinning, MD;  Location: [facility];  Service: Orthopedics;  Laterality: Left;  with Partial Lateral Menisectomy and Medial Menisectomy. Shaving of medial and lateral femoral condyles. Shaving of patella. Removal of a loose body  . LOOP RECORDER REMOVAL N/A 05/22/2018   Procedure: LOOP RECORDER REMOVAL;  Surgeon: Thompson Grayer, MD;  Location: [place] [facility];  Service: Cardiovascular;  Laterality: N/A;  . PACEMAKER IMPLANT N/A 05/22/2018   MDT Azure XT DR MRI implanted by Dr Rayann Heman for sick sinus syndrome  . tibial and fibular internal fixation Left   . TOTAL ABDOMINAL HYSTERECTOMY  86 years old  . UPPER GASTROINTESTINAL ENDOSCOPY  2009, 2013    Current Outpatient Medications  Medication Sig Dispense Refill  .  amitriptyline (ELAVIL) 25 MG tablet TAKE 1 TABLET AT BEDTIME 90 tablet 3  . amLODipine (NORVASC) 2.5 MG tablet TAKE 1 TABLET DAILY 90 tablet 1  . capecitabine (XELODA) 500 MG tablet TAKE 3 TABLETS (1500MG ) BY MOUTH TWICE DAILY AFTER A MEAL. TAKE ON DAYS 1-14 OF EACH 28 DAY CYCLE 84 tablet 2  . escitalopram (LEXAPRO) 20 MG tablet TAKE 1 TABLET AT BEDTIME 90 tablet 3  . esomeprazole (NEXIUM) 40 MG capsule Take 1 capsule (40 mg total) by mouth 2 (two) times daily before a meal. 180 capsule 1  . fluticasone (FLONASE) 50 MCG/ACT nasal spray Place 2 sprays into both nostrils at bedtime. 48 g 3  . furosemide (LASIX) 20 MG tablet TAKE 1 TABLET DAILY AS NEEDED FOR FLUID RETENTION 90 tablet 1  . gabapentin (NEURONTIN) 400 MG capsule TAKE 1 TO 2 CAPSULES THREE TIMES A DAY 720 capsule 1  . hydroxypropyl methylcellulose / hypromellose (ISOPTO TEARS / GONIOVISC) 2.5 % ophthalmic solution Place 1 drop into both eyes daily.     Marland Kitchen levothyroxine (SYNTHROID) 75 MCG tablet Take 1 tablet (75 mcg total) by mouth daily before breakfast. 90 tablet 1  . lovastatin (MEVACOR) 40 MG tablet Take 1 tablet (40 mg total) by mouth at bedtime. 90  tablet 1  . Melatonin 3 MG TBDP Take 3-6 mg by mouth at bedtime as needed. 60 tablet 1  . ondansetron (ZOFRAN) 8 MG tablet Take 1 tablet (8 mg total) by mouth every 8 (eight) hours as needed for nausea or vomiting. 90 tablet 1  . OXcarbazepine (TRILEPTAL) 150 MG tablet Take 1 tablet (150 mg total) by mouth 2 (two) times daily. 180 tablet 3  . rivaroxaban (XARELTO) 20 MG TABS tablet TAKE 1 TABLET DAILY WITH SUPPER 90 tablet 3  . silver sulfADIAZINE (SILVADENE) 1 % cream Apply 1 application topically daily. 400 g 0  . sulfamethoxazole-trimethoprim (BACTRIM DS) 800-160 MG tablet Take 1 tablet by mouth 2 (two) times daily. 14 tablet 0  . temozolomide (TEMODAR) 140 MG capsule TAKE 2 CAPSULES BY MOUTH DAILY FOR 5 DAYS,ON DAYS 10-14 OF EACH 28DAY CYCLE. MAY TAKE ON EMPTY STOMACH AT BEDTIME TO DECREASE NAUSEA & VOMITING 10 capsule 2   No current facility-administered medications for this visit.   Facility-Administered Medications Ordered in Other Visits  Medication Dose Route Frequency Provider Last Rate Last Admin  . heparin lock flush 100 unit/mL  500 Units Intracatheter Daily PRN Heilingoetter, Cassandra L, PA-C        Allergies:   Levaquin [levofloxacin] and Lisinopril   Social History:  The patient  reports that she quit smoking about 44 years ago. Her smoking use included cigarettes. She has a 2.50 pack-year smoking history. She has never used smokeless tobacco. She reports that she does not drink alcohol or use drugs.   Family History:  The patient's family history includes Coronary artery disease in her brother; Diabetes in her brother; Heart attack (age of onset: 29) in her father; Prostate cancer in her brother and son.  ROS:  Please see the history of present illness.    All other systems are reviewed and otherwise negative.   PHYSICAL EXAM:  VS:  BP (!) 148/65   Pulse 60   Ht 5\' 4"  (1.626 m)   Wt 220 lb (99.8 kg)   BMI 37.76 kg/m  BMI: Body mass index is 37.76 kg/m. Well  nourished,obese, chronically ill appearing, slouched position in wheelchair  HEENT: normocephalic, atraumatic  Neck: no JVD, carotid bruits  or masses Cardiac:  RRR; no significant murmurs, no rubs, or gallops Lungs:  CTA b/l, no wheezing, rhonchi or rales  Abd: soft, nontender, obese MS: no deformity, age appropriate/perhaps advanced atrophy Ext: trace piting edema, more of a brawny type edema Skin: warm and dry, no rash Neuro:  Somewhat difficult speech pattern to understand, neuro exam not performed, her husband states she is at her known baseline function that is wheelchair bound with marked motor weakness, able to assist minimally in transfers Psych: she is pleasant   PPM site is stable, no tethering or discomfort   EKG:  Not done today  PPM interrogation done today and reviewed by myself Battery and lead measurements are good AF burden is <0.1%, 96.2 AP   05/22/2018: LHC IMPRESSION: Ms. Herdt has essentially normal coronary arteries.  Recent 2D echo revealed normal LV function.  Her chest pain is noncardiac.  She is scheduled for permanent transvenous pacemaker insertion by Dr. Rayann Heman later this afternoon with concomitant explanting of her previously placed loop recorder.  The sheath was removed and a TR band was placed on the right wrist to achieve patent hemostasis.  The patient left the lab in stable condition.   04/29/2018: TTE Study Conclusions  - Left ventricle: The cavity size was normal. Wall thickness was  normal. Systolic function was vigorous. The estimated ejection  fraction was in the range of 65% to 70%. Doppler parameters are  consistent with high ventricular filling pressure.  - Aortic valve: Mildly thickened, mildly calcified leaflets. There  was mild stenosis. There was moderate regurgitation. Peak  velocity (S): 249 cm/s. Mean gradient (S): 13 mm Hg. Valve area  (VTI): 2.05 cm^2. Valve area (Vmax): 2.13 cm^2. Valve area  (Vmean): 1.93 cm^2.  -  Aorta: Ascending aortic diameter: 42 mm (S).  - Ascending aorta: The ascending aorta was mildly dilated.  - Left atrium: The atrium was mildly dilated.  - Pulmonary arteries: Systolic pressure was mildly increased. PA  peak pressure: 41 mm Hg (S).   Impressions:  - Aortic root 54mm in 2017, no significant change.     Recent Labs: 10/10/2018: TSH 3.060 08/18/2019: ALT 10; BUN 15; Creatinine 0.86; Potassium 4.1; Sodium 137 08/26/2019: Hemoglobin 8.0; Platelet Count 77  10/10/2018: Chol/HDL Ratio 2.8; Cholesterol, Total 133; HDL 48; LDL Calculated 59; Triglycerides 129   CrCl cannot be calculated (Patient's most recent lab result is older than the maximum 21 days allowed.).   Wt Readings from Last 3 Encounters:  09/11/19 220 lb (99.8 kg)  08/18/19 211 lb 8 oz (95.9 kg)  07/07/19 222 lb 4.8 oz (100.8 kg)     Other studies reviewed: Additional studies/records reviewed today include: summarized above  ASSESSMENT AND PLAN:  1. PPM      Intact function, no programming changes made  2. Paroxysmal AFib     CHA2DS2Vasc is 6, on Xarelto, appropriately dosed      Low burden  3. HTN     No changes   Multiple serious medical problems as noted above She is following closely with oncology service    Disposition: continue Q 3 mo remotes and in clinic with EP in 1 year, sooner if needed.    Current medicines are reviewed at length with the patient today.  The patient did not have any concerns regarding medicines.  Venetia Night, PA-C 09/11/2019 6:09 PM     [place] [address] [phone] (office)  [phone] (fax)

## 2019-09-10 ENCOUNTER — Other Ambulatory Visit: Payer: Self-pay | Admitting: Physician Assistant

## 2019-09-10 DIAGNOSIS — Z5111 Encounter for antineoplastic chemotherapy: Secondary | ICD-10-CM

## 2019-09-10 DIAGNOSIS — C7A8 Other malignant neuroendocrine tumors: Secondary | ICD-10-CM

## 2019-09-11 ENCOUNTER — Other Ambulatory Visit: Payer: Self-pay

## 2019-09-11 ENCOUNTER — Ambulatory Visit (INDEPENDENT_AMBULATORY_CARE_PROVIDER_SITE_OTHER): Payer: Medicare Other | Admitting: Physician Assistant

## 2019-09-11 ENCOUNTER — Ambulatory Visit: Payer: Medicare Other

## 2019-09-11 VITALS — BP 148/65 | HR 60 | Ht 64.0 in | Wt 220.0 lb

## 2019-09-11 DIAGNOSIS — I480 Paroxysmal atrial fibrillation: Secondary | ICD-10-CM

## 2019-09-11 DIAGNOSIS — Z950 Presence of cardiac pacemaker: Secondary | ICD-10-CM

## 2019-09-11 DIAGNOSIS — I495 Sick sinus syndrome: Secondary | ICD-10-CM | POA: Diagnosis not present

## 2019-09-11 DIAGNOSIS — I10 Essential (primary) hypertension: Secondary | ICD-10-CM

## 2019-09-11 NOTE — Patient Instructions (Addendum)
Medication Instructions:   Your physician recommends that you continue on your current medications as directed. Please refer to the Current Medication list given to you today.  *If you need a refill on your cardiac medications before your next appointment, please call your pharmacy*  Lab Work: NONE ORDERED  TODAY   If you have labs (blood work) drawn today and your tests are completely normal, you will receive your results only by: . MyChart Message (if you have MyChart) OR . A paper copy in the mail If you have any lab test that is abnormal or we need to change your treatment, we will call you to review the results.  Testing/Procedures: NONE ORDERED  TODAY   Follow-Up: At [facility], you and your health needs are our priority.  As part of our continuing mission to provide you with exceptional heart care, we have created designated Provider Care Teams.  These Care Teams include your primary Cardiologist (physician) and Advanced Practice Providers (APPs -  Physician Assistants and Nurse Practitioners) who all work together to provide you with the care you need, when you need it.  Your next appointment:   1 year(s)  The format for your next appointment:   In Person  Provider:   You may see James Allred, MD or one of the following Advanced Practice Providers on your designated Care Team:    Amber Seiler, NP  Renee Ursuy, PA-C  Michael "Andy" Tillery, PA-C   Other Instructions    

## 2019-09-15 MED FILL — TEMOZOLOMIDE 140 MG CAPS: 140 | 28 days supply | Qty: 10 | Fill #0

## 2019-09-15 MED FILL — CAPECITABINE 500 MG TABLET: 500 | 14 days supply | Qty: 84 | Fill #0

## 2019-09-16 ENCOUNTER — Other Ambulatory Visit: Payer: Self-pay

## 2019-09-16 ENCOUNTER — Ambulatory Visit
Admission: RE | Admit: 2019-09-16 | Discharge: 2019-09-16 | Disposition: A | Discharge status: Home / Self Care | Payer: Medicare Other | Source: Ambulatory Visit | Attending: Neurology | Admitting: Neurology

## 2019-09-16 DIAGNOSIS — D329 Benign neoplasm of meninges, unspecified: Secondary | ICD-10-CM | POA: Diagnosis not present

## 2019-09-16 DIAGNOSIS — D320 Benign neoplasm of cerebral meninges: Secondary | ICD-10-CM

## 2019-09-16 MED ORDER — IOPAMIDOL (ISOVUE-300) INJECTION 61%
75.0000 mL | Freq: Once | INTRAVENOUS | Status: AC | PRN
  Administered 2019-09-16: 75 mL via INTRAVENOUS

## 2019-09-22 ENCOUNTER — Other Ambulatory Visit: Payer: Self-pay | Admitting: Family Medicine

## 2019-09-29 ENCOUNTER — Ambulatory Visit: Payer: Medicare Other | Admitting: Neurology

## 2019-09-29 ENCOUNTER — Encounter: Payer: Self-pay | Admitting: Internal Medicine

## 2019-09-29 ENCOUNTER — Inpatient Hospital Stay: Payer: Medicare Other | Attending: Internal Medicine | Admitting: Internal Medicine

## 2019-09-29 ENCOUNTER — Other Ambulatory Visit: Payer: Self-pay

## 2019-09-29 ENCOUNTER — Inpatient Hospital Stay: Payer: Medicare Other

## 2019-09-29 VITALS — BP 123/73 | HR 60 | Temp 97.8°F | Resp 18 | Ht 64.0 in | Wt 207.4 lb

## 2019-09-29 DIAGNOSIS — Z7901 Long term (current) use of anticoagulants: Secondary | ICD-10-CM | POA: Diagnosis not present

## 2019-09-29 DIAGNOSIS — Z5111 Encounter for antineoplastic chemotherapy: Secondary | ICD-10-CM

## 2019-09-29 DIAGNOSIS — Z86711 Personal history of pulmonary embolism: Secondary | ICD-10-CM | POA: Insufficient documentation

## 2019-09-29 DIAGNOSIS — C3492 Malignant neoplasm of unspecified part of left bronchus or lung: Secondary | ICD-10-CM

## 2019-09-29 DIAGNOSIS — C7A090 Malignant carcinoid tumor of the bronchus and lung: Secondary | ICD-10-CM | POA: Diagnosis not present

## 2019-09-29 DIAGNOSIS — D61818 Other pancytopenia: Secondary | ICD-10-CM | POA: Insufficient documentation

## 2019-09-29 DIAGNOSIS — C7B8 Other secondary neuroendocrine tumors: Secondary | ICD-10-CM

## 2019-09-29 DIAGNOSIS — C7A8 Other malignant neuroendocrine tumors: Secondary | ICD-10-CM | POA: Insufficient documentation

## 2019-09-29 DIAGNOSIS — Z79899 Other long term (current) drug therapy: Secondary | ICD-10-CM | POA: Diagnosis not present

## 2019-09-29 DIAGNOSIS — C787 Secondary malignant neoplasm of liver and intrahepatic bile duct: Secondary | ICD-10-CM | POA: Insufficient documentation

## 2019-09-29 DIAGNOSIS — D696 Thrombocytopenia, unspecified: Secondary | ICD-10-CM | POA: Diagnosis not present

## 2019-09-29 LAB — CMP (CANCER CENTER ONLY)
ALT: 11 U/L (ref 0–44)
AST: 25 U/L (ref 15–41)
Albumin: 3.3 g/dL — ABNORMAL LOW (ref 3.5–5.0)
Alkaline Phosphatase: 117 U/L (ref 38–126)
Anion gap: 7 (ref 5–15)
BUN: 14 mg/dL (ref 8–23)
CO2: 26 mmol/L (ref 22–32)
Calcium: 9 mg/dL (ref 8.9–10.3)
Chloride: 108 mmol/L (ref 98–111)
Creatinine: 0.89 mg/dL (ref 0.44–1.00)
GFR, Est AFR Am: >60 mL/min (ref >60)
GFR, Estimated: 60 mL/min — ABNORMAL LOW (ref >60)
Glucose, Bld: 104 mg/dL — ABNORMAL HIGH (ref 70–99)
Potassium: 4.2 mmol/L (ref 3.5–5.1)
Sodium: 141 mmol/L (ref 135–145)
Total Bilirubin: 0.5 mg/dL (ref 0.3–1.2)
Total Protein: 6.1 g/dL — ABNORMAL LOW (ref 6.5–8.1)

## 2019-09-29 LAB — CBC WITH DIFFERENTIAL (CANCER CENTER ONLY)
Abs Immature Granulocytes: 0.01 10*3/uL (ref 0.00–0.07)
Basophils Absolute: 0 10*3/uL (ref 0.0–0.1)
Basophils Relative: 1 %
Eosinophils Absolute: 0.2 10*3/uL (ref 0.0–0.5)
Eosinophils Relative: 8 %
HCT: 33.6 % — ABNORMAL LOW (ref 36.0–46.0)
Hemoglobin: 10.5 g/dL — ABNORMAL LOW (ref 12.0–15.0)
Immature Granulocytes: 0 %
Lymphocytes Relative: 18 %
Lymphs Abs: 0.5 10*3/uL — ABNORMAL LOW (ref 0.7–4.0)
MCH: 31.9 pg (ref 26.0–34.0)
MCHC: 31.3 g/dL (ref 30.0–36.0)
MCV: 102.1 fL — ABNORMAL HIGH (ref 80.0–100.0)
Monocytes Absolute: 0.4 10*3/uL (ref 0.1–1.0)
Monocytes Relative: 16 %
Neutro Abs: 1.5 10*3/uL — ABNORMAL LOW (ref 1.7–7.7)
Neutrophils Relative %: 57 %
Platelet Count: 74 10*3/uL — ABNORMAL LOW (ref 150–400)
RBC: 3.29 MIL/uL — ABNORMAL LOW (ref 3.87–5.11)
RDW: 24.7 % — ABNORMAL HIGH (ref 11.5–15.5)
WBC Count: 2.5 10*3/uL — ABNORMAL LOW (ref 4.0–10.5)
nRBC: 0 % (ref 0.0–0.2)

## 2019-09-29 NOTE — Progress Notes (Signed)
[facility] [phone]   Fax:([phone]) [place], [place] [address]  DIAGNOSIS:  1) Metastatic intermediate. Neuroendocrine tumor of lung primary diagnosed in January 2018 and presented with small bilateral pulmonary nodules in addition to multiple liver metastasis. 2) right lower lobe pulmonary embolism diagnosed incidentally on CT scan of the chest on 04/23/2017  PRIOR THERAPY:  1) Status post radio embolization with Y 90 to the liver lesions by interventional radiology. 2) status post IVC filter placement by interventional radiology on 04/24/2017  CURRENT THERAPY: Xeloda 750 MG/M2 twice a day days 1-14 and Temodar 150 MG/M2 days 10-14 every 4 weeks. Status post 41 cycles.  She start cycle #42 on September 20, 2019.  INTERVAL HISTORY: [name] 86 y.o. female returns to the clinic today for follow-up visit accompanied by her husband.  The patient has increasing memory issues recently with some confusion.  She also gets agitated very easily this days.  She had CT of the head of the head with and without contrast recently that was unremarkable.  She denied having any current chest pain, shortness of breath, cough or hemoptysis.  She denied having any fever or chills.  She continues to tolerate her treatment with Xeloda and Temodar fairly well.  She is here today for evaluation and repeat blood work.   MEDICAL HISTORY: Past Medical History:  Diagnosis Date  . Acute respiratory failure with hypoxia ([facility]) 10/23/2015  . Allergic rhinitis    PT. DENIES  . Anxiety   . Aortic insufficiency    Echo 04/29/2018: EF 65-70, mild AS (mean 13), mod AI, Asc Aorta 42 mm (mildly dilated), mild LAE, PASP 41, pericardium normal in appearance.   . Arthritis    NECK  . Ataxia   . Back pain 12/13/2016  . Bradycardia    primarily nocturnal  . Burning tongue syndrome 25 years  . Cancer ([facility]) dx'd 06/2016   liver   . Cataract   . Cerebellar degeneration ([facility])   . Chest pain 12/13/2016   Atypical chest pain  . Chronic urinary tract infection   . Complication of anesthesia    low o2 sats, coded 30 years ago  . CVA (cerebral infarction) 05/2003  . Dehydration with hyponatremia 03/23/2018  . Depression   . Encounter for antineoplastic chemotherapy 10/09/2016  . Fatigue 01/03/2015  . Gait disorder   . Gastric polyps   . GERD (gastroesophageal reflux disease)   . Goals of care, counseling/discussion 10/09/2016  . H/O: CVA (cerebrovascular accident) 07/14/2016  . High cholesterol   . History of pericarditis   . Hyperlipidemia   . Hypotension   . Hypothyroidism   . IBS (irritable bowel syndrome)   . Neuroendocrine cancer ([facility]) 08/23/2016  . Obesity   . Paroxysmal atrial fibrillation ([name])    chads2vasc score is 6,  she is felt to be a poor candidate for anticoagulation  . Pericarditis   . Personal history of arterial venous malformation (AVM)    right side of face  . Seizure disorder ([facility])   . Seizures ([facility]) 2003   " smelling"- Gabapentin "no problem"  . Shortness of breath dyspnea    with exertion  . Sick sinus syndrome ([facility])   . Sternum fx 10/27/2013  . Stroke [facility]) 5 years ago   Right side of face weak, slurred speach-   . Thyroid disease   . TIA (transient ischemic attack)   .  UTI (lower urinary tract infection) 03/27/2016   "frequently"    ALLERGIES:  is allergic to levaquin [levofloxacin] and lisinopril.  MEDICATIONS:  Current Outpatient Medications  Medication Sig Dispense Refill  . amitriptyline (ELAVIL) 25 MG tablet TAKE 1 TABLET AT BEDTIME 90 tablet 3  . amLODipine (NORVASC) 2.5 MG tablet TAKE 1 TABLET DAILY 90 tablet 0  . capecitabine (XELODA) 500 MG tablet TAKE 3 TABLETS (1500MG ) BY MOUTH TWICE DAILY AFTER A MEAL. TAKE ON DAYS 1-14 OF EACH 28 DAY CYCLE 84 tablet 2  . escitalopram (LEXAPRO) 20 MG tablet TAKE 1 TABLET AT BEDTIME 90 tablet 3  . esomeprazole (NEXIUM) 40 MG  capsule Take 1 capsule (40 mg total) by mouth 2 (two) times daily before a meal. 180 capsule 1  . fluticasone (FLONASE) 50 MCG/ACT nasal spray Place 2 sprays into both nostrils at bedtime. 48 g 3  . furosemide (LASIX) 20 MG tablet TAKE 1 TABLET DAILY AS NEEDED FOR FLUID RETENTION 90 tablet 1  . gabapentin (NEURONTIN) 400 MG capsule TAKE 1 TO 2 CAPSULES THREE TIMES A DAY 720 capsule 1  . hydroxypropyl methylcellulose / hypromellose (ISOPTO TEARS / GONIOVISC) 2.5 % ophthalmic solution Place 1 drop into both eyes daily.     Marland Kitchen levothyroxine (SYNTHROID) 75 MCG tablet Take 1 tablet (75 mcg total) by mouth daily before breakfast. 90 tablet 1  . lovastatin (MEVACOR) 40 MG tablet Take 1 tablet (40 mg total) by mouth at bedtime. 90 tablet 1  . Melatonin 3 MG TBDP Take 3-6 mg by mouth at bedtime as needed. 60 tablet 1  . ondansetron (ZOFRAN) 8 MG tablet Take 1 tablet (8 mg total) by mouth every 8 (eight) hours as needed for nausea or vomiting. 90 tablet 1  . OXcarbazepine (TRILEPTAL) 150 MG tablet Take 1 tablet (150 mg total) by mouth 2 (two) times daily. 180 tablet 3  . rivaroxaban (XARELTO) 20 MG TABS tablet TAKE 1 TABLET DAILY WITH SUPPER 90 tablet 3  . silver sulfADIAZINE (SILVADENE) 1 % cream Apply 1 application topically daily. 400 g 0  . sulfamethoxazole-trimethoprim (BACTRIM DS) 800-160 MG tablet Take 1 tablet by mouth 2 (two) times daily. 14 tablet 0  . temozolomide (TEMODAR) 140 MG capsule TAKE 2 CAPSULES BY MOUTH DAILY FOR 5 DAYS,ON DAYS 10-14 OF EACH 28DAY CYCLE. MAY TAKE ON EMPTY STOMACH AT BEDTIME TO DECREASE NAUSEA & VOMITING 10 capsule 2   No current facility-administered medications for this visit.   Facility-Administered Medications Ordered in Other Visits  Medication Dose Route Frequency Provider Last Rate Last Admin  . heparin lock flush 100 unit/mL  500 Units Intracatheter Daily PRN Heilingoetter, Cassandra L, PA-C        SURGICAL HISTORY:  Past Surgical History:  Procedure  Laterality Date  . APPENDECTOMY  86 years old  . COLONOSCOPY  2006, 2009  . COLONOSCOPY WITH PROPOFOL N/A 03/28/2016   Procedure: COLONOSCOPY WITH PROPOFOL;  Surgeon: Gatha Mayer, MD;  Location: [facility];  Service: Endoscopy;  Laterality: N/A;  . CORONARY ANGIOGRAPHY N/A 05/22/2018   Procedure: CORONARY ANGIOGRAPHY (CATH LAB);  Surgeon: Lorretta Harp, MD;  Location: [facility];  Service: Cardiovascular;  Laterality: N/A;  . cyst removed  35 years ago  . EP IMPLANTABLE DEVICE N/A 01/06/2015   Procedure: Loop Recorder Insertion;  Surgeon: Thompson Grayer, MD;  Location: [facility];  Service: Cardiovascular;  Laterality: N/A;  . EYE SURGERY Right    Cataract  . [place]  ADDITIONAL VESSEL  11/28/2016  . IR ANGIOGRAM SELECTIVE EACH ADDITIONAL VESSEL  11/28/2016  . IR ANGIOGRAM SELECTIVE EACH ADDITIONAL VESSEL  11/28/2016  . IR ANGIOGRAM SELECTIVE EACH ADDITIONAL VESSEL  11/28/2016  . IR ANGIOGRAM SELECTIVE EACH ADDITIONAL VESSEL  12/13/2016  . IR ANGIOGRAM SELECTIVE EACH ADDITIONAL VESSEL  12/13/2016  . IR ANGIOGRAM SELECTIVE EACH ADDITIONAL VESSEL  01/09/2017  . IR ANGIOGRAM VISCERAL SELECTIVE  11/28/2016  . IR ANGIOGRAM VISCERAL SELECTIVE  11/28/2016  . IR ANGIOGRAM VISCERAL SELECTIVE  12/13/2016  . IR ANGIOGRAM VISCERAL SELECTIVE  12/13/2016  . IR ANGIOGRAM VISCERAL SELECTIVE  01/09/2017  . IR EMBO ARTERIAL NOT HEMORR HEMANG INC GUIDE ROADMAPPING  11/28/2016  . IR EMBO TUMOR ORGAN ISCHEMIA INFARCT INC GUIDE ROADMAPPING  12/13/2016  . IR EMBO TUMOR ORGAN ISCHEMIA INFARCT INC GUIDE ROADMAPPING  01/09/2017  . IR IVC FILTER PLMT / S&I /IMG GUID/MOD SED  04/24/2017  . IR RADIOLOGIST EVAL & MGMT  11/06/2016  . IR RADIOLOGIST EVAL & MGMT  01/02/2017  . IR RADIOLOGIST EVAL & MGMT  02/05/2017  . IR RADIOLOGIST EVAL & MGMT  05/02/2017  . IR RADIOLOGIST EVAL & MGMT  10/17/2017  . IR US GUIDE VASC ACCESS RIGHT  11/28/2016  . IR US GUIDE VASC ACCESS RIGHT  12/13/2016  . IR US GUIDE VASC  ACCESS RIGHT  01/09/2017  . KNEE ARTHROSCOPY Right 11/14/2006  . KNEE ARTHROSCOPY Bilateral 5 and 6 years ago  . KNEE ARTHROSCOPY WITH LATERAL MENISECTOMY  07/03/2012   Procedure: KNEE ARTHROSCOPY WITH LATERAL MENISECTOMY;  Surgeon: Magnus Sinning, MD;  Location: [facility];  Service: Orthopedics;  Laterality: Left;  with Partial Lateral Menisectomy and Medial Menisectomy. Shaving of medial and lateral femoral condyles. Shaving of patella. Removal of a loose body  . LOOP RECORDER REMOVAL N/A 05/22/2018   Procedure: LOOP RECORDER REMOVAL;  Surgeon: Thompson Grayer, MD;  Location: [facility];  Service: Cardiovascular;  Laterality: N/A;  . PACEMAKER IMPLANT N/A 05/22/2018   MDT Azure XT DR MRI implanted by Dr Rayann Heman for sick sinus syndrome  . tibial and fibular internal fixation Left   . TOTAL ABDOMINAL HYSTERECTOMY  86 years old  . UPPER GASTROINTESTINAL ENDOSCOPY  2009, 2013    REVIEW OF SYSTEMS:  A comprehensive review of systems was negative except for: Constitutional: positive for fatigue Musculoskeletal: positive for arthralgias and muscle weakness Neurological: positive for memory problems   PHYSICAL EXAMINATION: General appearance: alert, cooperative, fatigued and no distress Head: Normocephalic, without obvious abnormality, atraumatic Neck: no adenopathy, no JVD, supple, symmetrical, trachea midline and thyroid not enlarged, symmetric, no tenderness/mass/nodules Lymph nodes: Cervical, supraclavicular, and axillary nodes normal. Resp: clear to auscultation bilaterally Back: symmetric, no curvature. ROM normal. No CVA tenderness. Cardio: regular rate and rhythm, S1, S2 normal, no murmur, click, rub or gallop GI: soft, non-tender; bowel sounds normal; no masses,  no organomegaly Extremities: extremities normal, atraumatic, no cyanosis or edema  ECOG PERFORMANCE STATUS: 2 - Symptomatic, <50% confined to bed  Blood pressure 123/73, pulse 60, temperature 97.8 F (36.6 C), temperature  source Temporal, resp. rate 18, height 5\' 4"  (1.626 m), weight 207 lb 6.4 oz (94.1 kg), SpO2 100 %.  LABORATORY DATA: Lab Results  Component Value Date   WBC 2.5 (L) 09/29/2019   HGB 10.5 (L) 09/29/2019   HCT 33.6 (L) 09/29/2019   MCV 102.1 (H) 09/29/2019   PLT 74 (L) 09/29/2019      Chemistry      Component Value Date/Time  NA 137 08/18/2019 1426   NA 140 10/10/2018 1548   NA 135 (L) 07/10/2017 1142   K 4.1 08/18/2019 1426   K 4.6 07/10/2017 1142   CL 106 08/18/2019 1426   CO2 22 08/18/2019 1426   CO2 25 07/10/2017 1142   BUN 15 08/18/2019 1426   BUN 14 10/10/2018 1548   BUN 11.4 07/10/2017 1142   CREATININE 0.86 08/18/2019 1426   CREATININE 1.0 07/10/2017 1142      Component Value Date/Time   CALCIUM 8.7 (L) 08/18/2019 1426   CALCIUM 9.9 07/10/2017 1142   ALKPHOS 136 (H) 08/18/2019 1426   ALKPHOS 93 07/10/2017 1142   AST 20 08/18/2019 1426   AST 43 (H) 07/10/2017 1142   ALT 10 08/18/2019 1426   ALT 28 07/10/2017 1142   BILITOT 0.4 08/18/2019 1426   BILITOT 0.56 07/10/2017 1142       RADIOGRAPHIC STUDIES: CT HEAD W & WO CONTRAST  Result Date: 09/17/2019 CLINICAL DATA:  Followup meningioma. EXAM: CT HEAD WITHOUT AND WITH CONTRAST TECHNIQUE: Contiguous axial images were obtained from the base of the skull through the vertex without and with intravenous contrast CONTRAST:  45mL ISOVUE-300 IOPAMIDOL (ISOVUE-300) INJECTION 61% COMPARISON:  07/18/2018.  07/14/2016. FINDINGS: Brain: Approximately 1 cm in size meningioma the right of midline at the junction of the tentorium and the falx does not appear to have enlarged measurably since the study 14 months ago. No evidence of venous sinus invasion or occlusion. Elsewhere, chronic brain atrophy with old cerebellar infarctions and chronic small-vessel ischemic changes of the hemispheric white matter appears stable. Evidence of recent infarction, intra-axial mass lesion, hemorrhage, hydrocephalus or extra-axial collection.  Vascular: There is atherosclerotic calcification of the major vessels at the base of the brain. Skull: Negative Sinuses/Orbits: Clear/normal Other: None IMPRESSION: No evidence of interval enlargement of a meningioma the right of midline at the junction of the tentorium and falx. Approximate size 1 cm. I think previous measurements greater than a cm related to some inclusion of the adjacent venous sinuses. Slow growth has occurred since 2017 however, when I think it measured about 8 mm in diameter. No significant mass-effect upon the brain. Electronically Signed   By: Nelson Chimes M.D.   On: 09/17/2019 03:47   CUP PACEART REMOTE DEVICE CHECK  Result Date: 08/31/2019 Scheduled remote reviewed.  Normal device function.  5 AF longest 4 mins. Next remote 91 days.   ASSESSMENT AND PLAN:  This is a very pleasant 86  years old white female with metastatic intermediate grade neuroendocrine carcinoma of questionable lung primary and multiple metastatic liver lesions and pancreatic lesions.She status post treatment with radio embolization with Y90 to the left and right lobe liver lesions.Status post treatment with Y 90 to the left and right lobes of the liver. She is currently undergoing systemic chemotherapy with Xeloda and Temodar status post 41 cycles.   She has been tolerating this treatment well.  She is expected to start cycle #42 on February 21 , 2021. She is tolerating the treatment well except for fatigue. I recommended for her to continue her treatment as planned. I will see her back for follow-up visit in 6 weeks for evaluation with repeat CT scan of the chest, abdomen pelvis for restaging of her disease. For the persistent anemia, She will also continue on the oral iron tablets. For the dementia and confusion, she is followed by neurology. The patient was advised to call immediately if she has any concerning symptoms in the interval.  The patient voices understanding of current disease status and  treatment options and is in agreement with the current care plan. All questions were answered. The patient knows to call the clinic with any problems, questions or concerns. We can certainly see the patient much sooner if necessary.  Disclaimer: This note was dictated with voice recognition software. Similar sounding words can inadvertently be transcribed and may not be corrected upon review.

## 2019-09-29 NOTE — Patient Instructions (Signed)
Radiology will call you to schedule your CT scan. Our office will call you with a follow up appointment for 6 weeks.

## 2019-09-30 ENCOUNTER — Encounter: Payer: Self-pay | Admitting: Neurology

## 2019-09-30 ENCOUNTER — Telehealth: Payer: Self-pay | Admitting: Internal Medicine

## 2019-09-30 LAB — CHROMOGRANIN A: Chromogranin A (ng/mL): 1706 ng/mL — ABNORMAL HIGH (ref 0.0–101.8)

## 2019-09-30 NOTE — Telephone Encounter (Signed)
Scheduled per los. Called and spoke with patients husband. Confirmed appt

## 2019-09-30 NOTE — Progress Notes (Addendum)
Due to the COVID-19 crisis, this virtual check-in visit was done via telephone from my office and it was initiated and consent given by this patient and or family.   Telephone (Audio) Visit The purpose of this telephone visit is to provide medical care while limiting exposure to the novel coronavirus.    Consent was obtained for telephone visit and initiated by pt/family:  Yes.   Answered questions that patient had about telehealth interaction:  Yes.   I discussed the limitations, risks, security and privacy concerns of performing an evaluation and management service by telephone. I also discussed with the patient that there may be a patient responsible charge related to this service. The patient expressed understanding and agreed to proceed.  Pt location: Home Physician Location: office Name of referring provider:  [name], MD I connected with .[name] at patients initiation/request on 10/01/2019 at  8:50 AM EST by telephone and verified that I am speaking with the correct person using two identifiers.  Pt MRN:  [id number] Pt DOB:  01-05-1934   History of Present Illness:  [name] is an 86 year old right-handed Caucasian woman with cerebellar degeneration, metastatic intermediate grade neuroendocrine carcinoma, hypertension, paroxysmal atrial fibrillation, IBS, hypothyroidism and history of TIA and simple partial seizures who follows up for left-sided trigeminal neuralgia. She is accompanied by her husband who supplements history.  UPDATE: Current medication:Oxcarbazepine 150 mg twice daily; gabapentin 800 mg/400 mg / 800 mg for seizure prophylaxis. Amitriptyline 25 mg at bedtime for burning tongue syndrome.  Currently undergoing chemotherapy for metastatic neuroendocrine tumor of lung.  Overall, trigeminal neuralgia and burning tongue pain is controlled.  She may have a brief episode of trigeminal neuralgia once a week but manageable and not significantly severe.   She also has had increased confusion, memory loss and visual and auditory hallucinations.  Sometimes it may agitate her.  No seizures.  To follow up on meningioma, CT head with and without contrast was performed on 09/17/2019, which was personally reviewed and demonstrated stable meningioma size at 1 cm compared to 07/18/2018.  As per radiology report, prior measurements greater than 1 cm had included adjacent venous sinuses.  There appeared to have been slow growth since 2017, measuring about 8 mm on 07/14/2016.  09/30/2019 LABS:  CBC with WBC 2.5, HGB 10.5, HCT 33.6, PLT 74, ANC 1.5, ALC 0.5; CMP with Na 141, K 4.2, Cl 108, CO2 26, glucose 104, BUN 14, Cr 0.89, t bili 0.5, ALP 117, AST 25, ALT 11, GFR 60.   HISTORY: She reports history of an AVM on the right side of her face, diagnosed many years ago and therefore cannot use blood thinners. However, prior MRA of the head and neck did not reveal any AVM.  She also reports pain from the right side of her jaw radiating up to the right temple since 2017. In 2018, she developed pain around her left eye which would hurt whenever she moved her eye. It eventually resolved. She then developed a severe aching pain at her left TMJ which radiates up into her temple which occur daily, lasting a few seconds and occurring intermittent throughout the day. It may occur spontaneously but also is aggravated by exertion but not talking, chewing or brushing her teeth.  Repeat CT of head with and without contrast from 07/18/18 was personally reviewed and demonstrated known meningioma along right tentorium with interval growth compared to prior imaging from December 2017 (11 x 14 x 20 mm compared to 7 x  9 x 15 mm in 2017).  Sed rate from 06/26/2017 was 34.   Observations/Objective:   There were no vitals filed for this visit.  Assessment and Plan:   1.  Left-sided trigeminal neuralgia, stable 2.  Cerebral meningioma, stable 3.  Major neurocognitive  disorder.  Hallucinations over the past several months.  She has underlying dementia that may be aggravated by chemotherapy. 4.  Symptomatic partial epilepsy, stable 5.  Burning tongue syndrome, stable  1.  Oxcarbazepine 150mg  twice daily for trigeminal neuralgia and seizure prophylaxis 2.  Gabapentin 800mg /400mg /800mg  for trigeminal neuralgia/burning tongue syndrome and seizure prophylaxis. 3.  Because of polypharmacy, I will have her discontinue amitriptyline as oxcarbazepine and gabapentin may be controlling the burning tongue neuralgia. 4.  Again, due to polypharmacy and side effects, I would defer starting a cholinesterase inhibitor for dementia.  If absolutely necessary, I would choose to treat the hallucinations specifically, which would be an atypical antipsychotic like Seroquel.  I did discuss the black box warning with patient and her husband.  QTc interval from 04/28/2019 was 428.  I will reach out to her cardiologist, Dr. Gwenlyn Found, to see if he has any objection.  ADDENDUM:  Heard from Dr. Gwenlyn Found.  She may start Seroquel, will start 25mg  at bedtime.  Recommended repeat EKG in 2 weeks.  Will have her do this at his office. 4.  Repeat CT head in two years 5.  Follow up in 6 months.  Follow Up Instructions:    -I discussed the assessment and treatment plan with the patient. The patient was provided an opportunity to ask questions and all were answered. The patient agreed with the plan and demonstrated an understanding of the instructions.   The patient was advised to call back or seek an in-person evaluation if the symptoms worsen or if the condition fails to improve as anticipated.    Total Time spent in visit with the patient was:  12 minutes  Dudley Major, DO

## 2019-10-01 ENCOUNTER — Telehealth (INDEPENDENT_AMBULATORY_CARE_PROVIDER_SITE_OTHER): Payer: Medicare Other | Admitting: Neurology

## 2019-10-01 ENCOUNTER — Other Ambulatory Visit: Payer: Self-pay

## 2019-10-01 VITALS — Ht 64.0 in | Wt 207.0 lb

## 2019-10-01 DIAGNOSIS — G40109 Localization-related (focal) (partial) symptomatic epilepsy and epileptic syndromes with simple partial seizures, not intractable, without status epilepticus: Secondary | ICD-10-CM

## 2019-10-01 DIAGNOSIS — K146 Glossodynia: Secondary | ICD-10-CM

## 2019-10-01 DIAGNOSIS — D320 Benign neoplasm of cerebral meninges: Secondary | ICD-10-CM

## 2019-10-01 DIAGNOSIS — G500 Trigeminal neuralgia: Secondary | ICD-10-CM | POA: Diagnosis not present

## 2019-10-01 DIAGNOSIS — F0150 Vascular dementia without behavioral disturbance: Secondary | ICD-10-CM | POA: Diagnosis not present

## 2019-10-01 DIAGNOSIS — F0390 Unspecified dementia without behavioral disturbance: Secondary | ICD-10-CM

## 2019-10-03 DIAGNOSIS — Z23 Encounter for immunization: Secondary | ICD-10-CM | POA: Diagnosis not present

## 2019-10-05 NOTE — Progress Notes (Signed)
Virtual Visit via Telephone Note   This visit type was conducted due to national recommendations for restrictions regarding the COVID-19 Pandemic (e.g. social distancing) in an effort to limit this patient's exposure and mitigate transmission in our community.  Due to her co-morbid illnesses, this patient is at least at moderate risk for complications without adequate follow up.  This format is felt to be most appropriate for this patient at this time.  The patient did not have access to video technology/had technical difficulties with video requiring transitioning to audio format only (telephone).  All issues noted in this document were discussed and addressed.  No physical exam could be performed with this format.  Please refer to the patient's chart for her  consent to telehealth for [facility].  Evaluation Performed:  Follow-up visit  This visit type was conducted due to national recommendations for restrictions regarding the COVID-19 Pandemic (e.g. social distancing).  This format is felt to be most appropriate for this patient at this time.  All issues noted in this document were discussed and addressed.  No physical exam was performed (except for noted visual exam findings with Video Visits).  Please refer to the patient's chart (MyChart message for video visits and phone note for telephone visits) for the patient's consent to telehealth for [facility]  Date:  10/06/2019   ID:  [name], DOB [birth year], MRN [id number]  Patient Location:  [place] [address]   Provider location:     [place] [address] Office [phone] Fax [phone]   PCP:  [name], MD  Cardiologist:  Quay Burow, MD  Electrophysiologist:  Thompson Grayer, MD   Chief Complaint: Follow-up  History of Present Illness:    [name] is a 86 y.o. female who presents via audio/video conferencing for a telehealth  visit today.  Patient verified DOB and address.  The patient does not symptoms concerning for COVID-19 infection (fever, chills, cough, or new SHORTNESS OF BREATH).   [name] has a past medical history of essential hypertension, hyperlipidemia, paroxysmal atrial fibrillation, symptomatic bradycardia and TIA.  Her father passed away at age 56 from a heart attack and her brother passed away from heart attack as well.  She also has another brother that has had open heart surgery.  She has suffered a sternal fracture due to a fall.  A follow-up chest CT showed abnormalities in her lung parenchyma which have been followed by serial CT scans.  She has been admitted to the hospital in the past due to chest pain.  CTs at that time were negative for PE.  She subsequently underwent a Myoview stress test which was negative.  She had complaints of episodes of weakness that were accompanied by palpitations.  Due to her palpitations a 30-day event monitor was ordered and showed sinus rhythm with episodes of sinus bradycardia, pauses up to 2 seconds and a short run of paroxysmal atrial fibrillation with RVR.  She was seen and evaluated by EP.  Dr. Rayann Heman implanted a loop recorder which showed brief episodes of PAF but no arrhythmias that were felt to be contributing to her syncope.  At that time she was placed on Xarelto.  Her loop recorder was later evaluated showed bradycardia that was felt to be contributing to her symptoms.  Her loop recorder was removed on 05/22/2018 and she received a PPM for symptomatic bradycardia.  Prior to her PPM placement  she underwent cardiac catheterization 05/22/2018 which showed minimal coronary artery disease and normal left ventricular function.  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  She has had DVT/PE and has had a IVC filter placed.  This is followed by Dr. [name] who has indicated that he would not like Xarelto stopped due to her  clotting events.  She was last seen by Dr. Gwenlyn Found on 04/28/2019.  At that time she was wheelchair-bound due to cerebellar degeneration.    She is seen today virtually with her husband who is medical POA.  He states she has not been doing very well recently.  She has increased dementia and hallucinations.  Dr. Tomi Likens (neurology) has offered to prescribe Seroquel after consulting cardiology.  Previous QTc interval 04/28/2019 was 428.  Today Mr. Chojnowski denies his wife has had chest pain, shortness of breath, lower extremity edema, fatigue, palpitations, melena, hematuria, hemoptysis, diaphoresis, orthopnea, and PND.   Prior CV studies:   The following studies were reviewed today:  EKG 04/28/2019 A paced with prolonged AV conduction 60 bpm  Echocardiogram 04/29/2018 Study Conclusions   - Left ventricle: The cavity size was normal. Wall thickness was  normal. Systolic function was vigorous. The estimated ejection  fraction was in the range of 65% to 70%. Doppler parameters are  consistent with high ventricular filling pressure.  - Aortic valve: Mildly thickened, mildly calcified leaflets. There  was mild stenosis. There was moderate regurgitation. Peak  velocity (S): 249 cm/s. Mean gradient (S): 13 mm Hg. Valve area  (VTI): 2.05 cm^2. Valve area (Vmax): 2.13 cm^2. Valve area  (Vmean): 1.93 cm^2.  - Aorta: Ascending aortic diameter: 42 mm (S).  - Ascending aorta: The ascending aorta was mildly dilated.  - Left atrium: The atrium was mildly dilated.  - Pulmonary arteries: Systolic pressure was mildly increased. PA  peak pressure: 41 mm Hg (S).   Impressions:   - Aortic root 36mm in 2017, no significant change.   Past Medical History:  Diagnosis Date  . Acute respiratory failure with hypoxia ([place]) 10/23/2015  . Allergic rhinitis    PT. DENIES  . Anxiety   . Aortic insufficiency    Echo 04/29/2018: EF 65-70, mild AS (mean 13), mod AI, Asc Aorta 42 mm (mildly dilated), mild  LAE, PASP 41, pericardium normal in appearance.   . Arthritis    NECK  . Ataxia   . Back pain 12/13/2016  . Bradycardia    primarily nocturnal  . Burning tongue syndrome 25 years  . Cancer ([place]) dx'd 06/2016   liver  . Cataract   . Cerebellar degeneration ([place])   . Chest pain 12/13/2016   Atypical chest pain  . Chronic urinary tract infection   . Complication of anesthesia    low o2 sats, coded 30 years ago  . CVA (cerebral infarction) 05/2003  . Dehydration with hyponatremia 03/23/2018  . Depression   . Encounter for antineoplastic chemotherapy 10/09/2016  . Fatigue 01/03/2015  . Gait disorder   . Gastric polyps   . GERD (gastroesophageal reflux disease)   . Goals of care, counseling/discussion 10/09/2016  . H/O: CVA (cerebrovascular accident) 07/14/2016  . High cholesterol   . History of pericarditis   . Hyperlipidemia   . Hypotension   . Hypothyroidism   . IBS (irritable bowel syndrome)   . Neuroendocrine cancer ([place]) 08/23/2016  . Obesity   . Paroxysmal atrial fibrillation ([name])    chads2vasc score is 6,  she is felt to be a poor candidate for anticoagulation  . Pericarditis   . Personal history of arterial venous malformation (AVM)    right side of face  . Seizure disorder ([place])   . Seizures ([place]) 2003   " smelling"- Gabapentin "no problem"  . Shortness of breath dyspnea    with exertion  . Sick sinus syndrome ([place])   . Sternum fx 10/27/2013  . Stroke [facility]) 5 years ago   Right side of face weak, slurred speach-   . Thyroid disease   . TIA (transient ischemic attack)   . UTI (lower urinary tract infection) 03/27/2016   "frequently"   Past Surgical History:  Procedure Laterality Date  . APPENDECTOMY  86 years old  . COLONOSCOPY  2006, 2009  . COLONOSCOPY WITH PROPOFOL N/A 03/28/2016   Procedure: COLONOSCOPY WITH PROPOFOL;  Surgeon: Gatha Mayer, MD;  Location: [place];  Service: Endoscopy;  Laterality: N/A;  . CORONARY ANGIOGRAPHY N/A 05/22/2018    Procedure: CORONARY ANGIOGRAPHY (CATH LAB);  Surgeon: Lorretta Harp, MD;  Location: [place] CV LAB;  Service: Cardiovascular;  Laterality: N/A;  . cyst removed  35 years ago  . EP IMPLANTABLE DEVICE N/A 01/06/2015   Procedure: Loop Recorder Insertion;  Surgeon: Thompson Grayer, MD;  Location: [place] CV LAB;  Service: Cardiovascular;  Laterality: N/A;  . EYE SURGERY Right    Cataract  . IR ANGIOGRAM SELECTIVE EACH ADDITIONAL VESSEL  11/28/2016  . IR ANGIOGRAM SELECTIVE EACH ADDITIONAL VESSEL  11/28/2016  . IR ANGIOGRAM SELECTIVE EACH ADDITIONAL VESSEL  11/28/2016  . IR ANGIOGRAM SELECTIVE EACH ADDITIONAL VESSEL  11/28/2016  . IR ANGIOGRAM SELECTIVE EACH ADDITIONAL VESSEL  12/13/2016  . IR ANGIOGRAM SELECTIVE EACH ADDITIONAL VESSEL  12/13/2016  . IR ANGIOGRAM SELECTIVE EACH ADDITIONAL VESSEL  01/09/2017  . IR ANGIOGRAM VISCERAL SELECTIVE  11/28/2016  . IR ANGIOGRAM VISCERAL SELECTIVE  11/28/2016  . IR ANGIOGRAM VISCERAL SELECTIVE  12/13/2016  . IR ANGIOGRAM VISCERAL SELECTIVE  12/13/2016  . IR ANGIOGRAM VISCERAL SELECTIVE  01/09/2017  . IR EMBO ARTERIAL NOT HEMORR HEMANG INC GUIDE ROADMAPPING  11/28/2016  . IR EMBO TUMOR ORGAN ISCHEMIA INFARCT INC GUIDE ROADMAPPING  12/13/2016  . IR EMBO TUMOR ORGAN ISCHEMIA INFARCT INC GUIDE ROADMAPPING  01/09/2017  . IR IVC FILTER PLMT / S&I /IMG GUID/MOD SED  04/24/2017  . IR RADIOLOGIST EVAL & MGMT  11/06/2016  . IR RADIOLOGIST EVAL & MGMT  01/02/2017  . IR RADIOLOGIST EVAL & MGMT  02/05/2017  . IR RADIOLOGIST EVAL & MGMT  05/02/2017  . IR RADIOLOGIST EVAL & MGMT  10/17/2017  . IR US GUIDE VASC ACCESS RIGHT  11/28/2016  . IR US GUIDE VASC ACCESS RIGHT  12/13/2016  . IR US GUIDE VASC ACCESS RIGHT  01/09/2017  . KNEE ARTHROSCOPY Right 11/14/2006  . KNEE ARTHROSCOPY Bilateral 5 and 6 years ago  . KNEE ARTHROSCOPY WITH LATERAL MENISECTOMY  07/03/2012   Procedure: KNEE ARTHROSCOPY WITH LATERAL MENISECTOMY;  Surgeon: Magnus Sinning, MD;  Location: [facility];  Service: Orthopedics;   Laterality: Left;  with Partial Lateral Menisectomy and Medial Menisectomy. Shaving of medial and lateral femoral condyles. Shaving of patella. Removal of a loose body  . LOOP RECORDER REMOVAL N/A 05/22/2018   Procedure: LOOP RECORDER REMOVAL;  Surgeon: Thompson Grayer, MD;  Location: [place] CV LAB;  Service: Cardiovascular;  Laterality: N/A;  . PACEMAKER IMPLANT N/A 05/22/2018   MDT Azure XT DR MRI implanted by Dr Rayann Heman for sick  sinus syndrome  . tibial and fibular internal fixation Left   . TOTAL ABDOMINAL HYSTERECTOMY  86 years old  . UPPER GASTROINTESTINAL ENDOSCOPY  2009, 2013     Current Meds  Medication Sig  . amLODipine (NORVASC) 2.5 MG tablet TAKE 1 TABLET DAILY  . capecitabine (XELODA) 500 MG tablet TAKE 3 TABLETS (1500MG ) BY MOUTH TWICE DAILY AFTER A MEAL. TAKE ON DAYS 1-14 OF EACH 28 DAY CYCLE  . escitalopram (LEXAPRO) 20 MG tablet TAKE 1 TABLET AT BEDTIME  . esomeprazole (NEXIUM) 40 MG capsule Take 1 capsule (40 mg total) by mouth 2 (two) times daily before a meal.  . fluticasone (FLONASE) 50 MCG/ACT nasal spray Place 2 sprays into both nostrils at bedtime.  . gabapentin (NEURONTIN) 400 MG capsule TAKE 1 TO 2 CAPSULES THREE TIMES A DAY  . hydroxypropyl methylcellulose / hypromellose (ISOPTO TEARS / GONIOVISC) 2.5 % ophthalmic solution Place 1 drop into both eyes daily.   Marland Kitchen levothyroxine (SYNTHROID) 75 MCG tablet Take 1 tablet (75 mcg total) by mouth daily before breakfast.  . lovastatin (MEVACOR) 40 MG tablet Take 1 tablet (40 mg total) by mouth at bedtime.  . Melatonin 3 MG TBDP Take 3-6 mg by mouth at bedtime as needed.  . ondansetron (ZOFRAN) 8 MG tablet Take 1 tablet (8 mg total) by mouth every 8 (eight) hours as needed for nausea or vomiting.  . OXcarbazepine (TRILEPTAL) 150 MG tablet Take 1 tablet (150 mg total) by mouth 2 (two) times daily.  . rivaroxaban (XARELTO) 20 MG TABS tablet TAKE 1 TABLET DAILY WITH SUPPER  . silver sulfADIAZINE (SILVADENE) 1 % cream Apply 1  application topically daily.  Marland Kitchen temozolomide (TEMODAR) 140 MG capsule TAKE 2 CAPSULES BY MOUTH DAILY FOR 5 DAYS,ON DAYS 10-14 OF EACH 28DAY CYCLE. MAY TAKE ON EMPTY STOMACH AT BEDTIME TO DECREASE NAUSEA & VOMITING     Allergies:   Levaquin [levofloxacin] and Lisinopril   Social History   Tobacco Use  . Smoking status: Former Smoker    Packs/day: 0.25    Years: 10.00    Pack years: 2.50    Types: Cigarettes    Quit date: 07/31/1975    Years since quitting: 44.2  . Smokeless tobacco: Never Used  Substance Use Topics  . Alcohol use: No    Comment: Rare- maybe a drink ever 2 years  . Drug use: No     Family Hx: The patient's family history includes Coronary artery disease in her brother; Diabetes in her brother; Heart attack (age of onset: 30) in her father; Prostate cancer in her brother and son.  ROS:   Please see the history of present illness.     All other systems reviewed and are negative.   Labs/Other Tests and Data Reviewed:    Recent Labs: 10/10/2018: TSH 3.060 09/29/2019: ALT 11; BUN 14; Creatinine 0.89; Hemoglobin 10.5; Platelet Count 74; Potassium 4.2; Sodium 141   Recent Lipid Panel Lab Results  Component Value Date/Time   CHOL 133 10/10/2018 03:48 PM   TRIG 129 10/10/2018 03:48 PM   TRIG 131 10/12/2014 03:07 PM   HDL 48 10/10/2018 03:48 PM   HDL 59 10/12/2014 03:07 PM   CHOLHDL 2.8 10/10/2018 03:48 PM   CHOLHDL 2.5 07/15/2016 04:44 AM   LDLCALC 59 10/10/2018 03:48 PM    Wt Readings from Last 3 Encounters:  10/06/19 207 lb (93.9 kg)  09/30/19 207 lb (93.9 kg)  09/29/19 207 lb 6.4 oz (94.1 kg)     Exam:  Vital Signs:  BP (!) 142/76   Pulse 62   Wt 207 lb (93.9 kg)   BMI 35.53 kg/m    Well nourished, well developed female in no  acute distress.   ASSESSMENT & PLAN:    1.  Essential hypertension-BP today 142/76.  Well-controlled to this point.  Husband reports routine measurements 130s over 70s. Continue amlodipine 2.5 mg daily Continue Lasix  20 mg tablet daily as needed for fluid retention Heart healthy low-sodium diet Increase physical activity as tolerated Blood pressure log given  Hyperlipidemia-10/10/2018: Cholesterol, Total 133; HDL 48; LDL Calculated 59; Triglycerides 129 Continue lovastatin 40 mg tablet daily   Paroxysmal atrial fibrillation-history of paroxysmal atrial fibrillation prior EKG showed atrial paced rhythm. Continue Xarelto  Mild aortic stenosis-history of mild aortic stenosis echocardiogram 04/29/2018 showed normal LV function with aortic valve area of 2 cm and peak gradient 25 mmHg.  She remains nonambulatory.  Not a good candidate for aortic valve replacement.  No need to recheck echocardiogram at this time.  Neuroendocrine carcinoma of the lung-metastasis to liver.  DVT/PE with IVC filter in place Continue Xarelto Follows with oncology, Dr. [name]  Dementia/hallucinations-patient has had increased dementia and episodes of hallucinations.  Recently seen by Dr. Tomi Likens who has offered to prescribe Seroquel after consulting cardiology.  Previous QTc interval 04/28/2019 was 428. Followed by neurology    COVID-19 Education: The signs and symptoms of COVID-19 were discussed with the patient and how to seek care for testing (follow up with PCP or arrange E-visit).  The importance of social distancing was discussed today.  Patient Risk:   After full review of this patients clinical status, I feel that they are at least moderate risk at this time.  Time:   Today, I have spent 10 minutes with the patient with telehealth technology discussing blood pressure, chest pain, dementia/hallucinations, EKG.     Medication Adjustments/Labs and Tests Ordered: Current medicines are reviewed at length with the patient today.  Concerns regarding medicines are outlined above.   Tests Ordered: No orders of the defined types were placed in this encounter.  Medication Changes: No orders of the defined types were placed  in this encounter.   Disposition:  in 6 month(s)  Signed, Jossie Ng. [facility] [place] [address] Office [phone] Fax [phone]

## 2019-10-06 ENCOUNTER — Telehealth (INDEPENDENT_AMBULATORY_CARE_PROVIDER_SITE_OTHER): Payer: Medicare Other | Admitting: General Practice

## 2019-10-06 VITALS — BP 142/76 | HR 62 | Wt 207.0 lb

## 2019-10-06 DIAGNOSIS — I10 Essential (primary) hypertension: Secondary | ICD-10-CM

## 2019-10-06 DIAGNOSIS — I350 Nonrheumatic aortic (valve) stenosis: Secondary | ICD-10-CM

## 2019-10-06 DIAGNOSIS — R443 Hallucinations, unspecified: Secondary | ICD-10-CM

## 2019-10-06 DIAGNOSIS — I480 Paroxysmal atrial fibrillation: Secondary | ICD-10-CM

## 2019-10-06 DIAGNOSIS — E782 Mixed hyperlipidemia: Secondary | ICD-10-CM

## 2019-10-06 DIAGNOSIS — E785 Hyperlipidemia, unspecified: Secondary | ICD-10-CM | POA: Diagnosis not present

## 2019-10-06 DIAGNOSIS — C7A8 Other malignant neuroendocrine tumors: Secondary | ICD-10-CM

## 2019-10-06 DIAGNOSIS — C7B8 Other secondary neuroendocrine tumors: Secondary | ICD-10-CM

## 2019-10-06 DIAGNOSIS — F0391 Unspecified dementia with behavioral disturbance: Secondary | ICD-10-CM | POA: Diagnosis not present

## 2019-10-06 NOTE — Patient Instructions (Signed)
Special Instructions: TAKE AND LOG YOUR BLOOD PRESSURE DAILY AT DIFFERENT TIMES ONCE DAILY  Follow-Up: 6 months Please call our office 2 months in advance, JUL 2022 to schedule this SEPT 2022 appointment. In Person Quay Burow, MD.    At [facility], you and your health needs are our priority.  As part of our continuing mission to provide you with exceptional heart care, we have created designated Provider Care Teams.  These Care Teams include your primary Cardiologist (physician) and Advanced Practice Providers (APPs -  Physician Assistants and Nurse Practitioners) who all work together to provide you with the care you need, when you need it.  Reduce your risk of getting COVID-19 With your heart disease it is especially important for people at increased risk of severe illness from COVID-19, and those who live with them, to protect themselves from getting COVID-19. The best way to protect yourself and to help reduce the spread of the virus that causes COVID-19 is to: Marland Kitchen Limit your interactions with other people as much as possible. . Take COVID-19 when you do interact with others. If you start feeling sick and think you may have COVID-19, get in touch with your healthcare provider within 24 hours.  Thank you for choosing [facility] at [facility]!!

## 2019-10-08 ENCOUNTER — Other Ambulatory Visit: Payer: Self-pay

## 2019-10-08 ENCOUNTER — Other Ambulatory Visit: Payer: Self-pay | Admitting: Neurology

## 2019-10-08 ENCOUNTER — Telehealth: Payer: Self-pay

## 2019-10-08 DIAGNOSIS — R001 Bradycardia, unspecified: Secondary | ICD-10-CM

## 2019-10-08 DIAGNOSIS — I10 Essential (primary) hypertension: Secondary | ICD-10-CM

## 2019-10-08 DIAGNOSIS — I480 Paroxysmal atrial fibrillation: Secondary | ICD-10-CM

## 2019-10-08 MED ORDER — QUETIAPINE FUMARATE 25 MG PO TABS: 25.0000 mg | ORAL_TABLET | Freq: Every day | ORAL | 3 refills | Status: DC

## 2019-10-08 NOTE — Telephone Encounter (Signed)
-----   Message from Pieter Partridge, DO sent at 10/08/2019  6:56 AM EST ----- Please let patient and husband know that I have heard back from Dr. Gwenlyn Found and we can start quetiapine for hallucinations.  I sent a prescription to the [facility].  It was recommended to have a 12 lead EKG performed 2 weeks after starting the medication.  I would like to set this up to be performed at Dr. Kennon Holter office.

## 2019-10-08 NOTE — Telephone Encounter (Signed)
Contacted Patients husband regarding Rx. Ekg will be performed at Dr. Rachel Bo office they will contact him for the appt.

## 2019-10-11 ENCOUNTER — Other Ambulatory Visit: Payer: Self-pay | Admitting: Family Medicine

## 2019-10-11 DIAGNOSIS — R609 Edema, unspecified: Secondary | ICD-10-CM

## 2019-10-11 DIAGNOSIS — E782 Mixed hyperlipidemia: Secondary | ICD-10-CM

## 2019-10-12 NOTE — Telephone Encounter (Signed)
Patients medication list states she is not taking the furosemide. I do see that it is an  as needed medication. Is it okay to fill?

## 2019-10-14 MED FILL — CAPECITABINE 500 MG TABLET: 500 | 14 days supply | Qty: 84 | Fill #1

## 2019-10-14 MED FILL — TEMOZOLOMIDE 140 MG CAPS: 140 | 28 days supply | Qty: 10 | Fill #1

## 2019-10-20 ENCOUNTER — Telehealth: Payer: Self-pay | Admitting: Cardiovascular Disease

## 2019-10-20 NOTE — Telephone Encounter (Signed)
Mr. Raysor to accompany the pt for her EKG/ Nurse visit 10/22/19 due to her dementia and hallucinations.

## 2019-10-20 NOTE — Telephone Encounter (Signed)
  [name] has a nurse appt 10/22/19 and [name] will need to come with her because she has dementia

## 2019-10-22 ENCOUNTER — Ambulatory Visit (INDEPENDENT_AMBULATORY_CARE_PROVIDER_SITE_OTHER): Payer: Medicare Other | Admitting: General Practice

## 2019-10-22 ENCOUNTER — Other Ambulatory Visit: Payer: Self-pay

## 2019-10-22 DIAGNOSIS — Z79899 Other long term (current) drug therapy: Secondary | ICD-10-CM

## 2019-10-22 NOTE — Progress Notes (Signed)
1. Reason for visit: EKG to assess QTC due to pt starting Seroquel.     2. Name of MD requesting visit: Dr. Tomi Likens    3. Assessment and plan per MD: EKG reviewed by Coletta Memos, NP who noted EKG looks ok for pt to continue medication. Will forward note to Dr. Georgie Chard office.

## 2019-10-26 ENCOUNTER — Other Ambulatory Visit: Payer: Self-pay | Admitting: Neurology

## 2019-10-26 MED ORDER — QUETIAPINE FUMARATE 25 MG PO TABS: 25.0000 mg | ORAL_TABLET | Freq: Every day | ORAL | 1 refills | Status: DC

## 2019-10-26 NOTE — Telephone Encounter (Signed)
Patient is needing the Quetiapine fumarte 25mg  tabs refill for [facility] They need the 3 months so it's only 10 a month for prescription. Thanks!

## 2019-10-26 NOTE — Telephone Encounter (Signed)
90 supply [facility]apine [facility] to [facility].

## 2019-11-06 ENCOUNTER — Encounter (HOSPITAL_COMMUNITY): Payer: Self-pay | Admitting: *Deleted

## 2019-11-06 ENCOUNTER — Encounter (HOSPITAL_COMMUNITY): Payer: Self-pay

## 2019-11-06 ENCOUNTER — Telehealth: Payer: Self-pay | Admitting: Medical Oncology

## 2019-11-06 ENCOUNTER — Ambulatory Visit (HOSPITAL_COMMUNITY)
Admission: RE | Admit: 2019-11-06 | Discharge: 2019-11-06 | Disposition: A | Discharge status: Home / Self Care | Payer: Medicare Other | Source: Ambulatory Visit | Attending: Internal Medicine | Admitting: Internal Medicine

## 2019-11-06 ENCOUNTER — Inpatient Hospital Stay (HOSPITAL_COMMUNITY)
Admission: EM | Admit: 2019-11-06 | Discharge: 2019-11-09 | DRG: 378 | Disposition: A | Discharge status: Home / Self Care | Payer: Medicare Other | Attending: Internal Medicine | Admitting: Internal Medicine

## 2019-11-06 ENCOUNTER — Inpatient Hospital Stay: Payer: Medicare Other | Attending: Internal Medicine

## 2019-11-06 ENCOUNTER — Other Ambulatory Visit: Payer: Self-pay

## 2019-11-06 DIAGNOSIS — Z888 Allergy status to other drugs, medicaments and biological substances status: Secondary | ICD-10-CM

## 2019-11-06 DIAGNOSIS — D62 Acute posthemorrhagic anemia: Secondary | ICD-10-CM | POA: Diagnosis not present

## 2019-11-06 DIAGNOSIS — Z90710 Acquired absence of both cervix and uterus: Secondary | ICD-10-CM | POA: Insufficient documentation

## 2019-11-06 DIAGNOSIS — Z86711 Personal history of pulmonary embolism: Secondary | ICD-10-CM

## 2019-11-06 DIAGNOSIS — F0390 Unspecified dementia without behavioral disturbance: Secondary | ICD-10-CM | POA: Insufficient documentation

## 2019-11-06 DIAGNOSIS — Z950 Presence of cardiac pacemaker: Secondary | ICD-10-CM

## 2019-11-06 DIAGNOSIS — D61818 Other pancytopenia: Secondary | ICD-10-CM | POA: Diagnosis present

## 2019-11-06 DIAGNOSIS — F32A Depression, unspecified: Secondary | ICD-10-CM | POA: Diagnosis present

## 2019-11-06 DIAGNOSIS — Z6834 Body mass index (BMI) 34.0-34.9, adult: Secondary | ICD-10-CM

## 2019-11-06 DIAGNOSIS — E7800 Pure hypercholesterolemia, unspecified: Secondary | ICD-10-CM | POA: Insufficient documentation

## 2019-11-06 DIAGNOSIS — R269 Unspecified abnormalities of gait and mobility: Secondary | ICD-10-CM | POA: Diagnosis present

## 2019-11-06 DIAGNOSIS — K589 Irritable bowel syndrome without diarrhea: Secondary | ICD-10-CM | POA: Diagnosis present

## 2019-11-06 DIAGNOSIS — F419 Anxiety disorder, unspecified: Secondary | ICD-10-CM | POA: Insufficient documentation

## 2019-11-06 DIAGNOSIS — C7B8 Other secondary neuroendocrine tumors: Secondary | ICD-10-CM

## 2019-11-06 DIAGNOSIS — I2699 Other pulmonary embolism without acute cor pulmonale: Secondary | ICD-10-CM | POA: Diagnosis not present

## 2019-11-06 DIAGNOSIS — I495 Sick sinus syndrome: Secondary | ICD-10-CM | POA: Diagnosis present

## 2019-11-06 DIAGNOSIS — K317 Polyp of stomach and duodenum: Secondary | ICD-10-CM | POA: Diagnosis present

## 2019-11-06 DIAGNOSIS — C7A8 Other malignant neuroendocrine tumors: Secondary | ICD-10-CM

## 2019-11-06 DIAGNOSIS — F329 Major depressive disorder, single episode, unspecified: Secondary | ICD-10-CM | POA: Diagnosis present

## 2019-11-06 DIAGNOSIS — Z20822 Contact with and (suspected) exposure to covid-19: Secondary | ICD-10-CM | POA: Diagnosis present

## 2019-11-06 DIAGNOSIS — C7800 Secondary malignant neoplasm of unspecified lung: Secondary | ICD-10-CM | POA: Diagnosis not present

## 2019-11-06 DIAGNOSIS — D638 Anemia in other chronic diseases classified elsewhere: Secondary | ICD-10-CM

## 2019-11-06 DIAGNOSIS — E669 Obesity, unspecified: Secondary | ICD-10-CM | POA: Insufficient documentation

## 2019-11-06 DIAGNOSIS — Z923 Personal history of irradiation: Secondary | ICD-10-CM

## 2019-11-06 DIAGNOSIS — E039 Hypothyroidism, unspecified: Secondary | ICD-10-CM | POA: Diagnosis present

## 2019-11-06 DIAGNOSIS — D329 Benign neoplasm of meninges, unspecified: Secondary | ICD-10-CM | POA: Diagnosis present

## 2019-11-06 DIAGNOSIS — F0391 Unspecified dementia with behavioral disturbance: Secondary | ICD-10-CM | POA: Diagnosis not present

## 2019-11-06 DIAGNOSIS — R3915 Urgency of urination: Secondary | ICD-10-CM | POA: Diagnosis present

## 2019-11-06 DIAGNOSIS — Z993 Dependence on wheelchair: Secondary | ICD-10-CM | POA: Diagnosis not present

## 2019-11-06 DIAGNOSIS — K922 Gastrointestinal hemorrhage, unspecified: Secondary | ICD-10-CM

## 2019-11-06 DIAGNOSIS — G319 Degenerative disease of nervous system, unspecified: Secondary | ICD-10-CM

## 2019-11-06 DIAGNOSIS — K219 Gastro-esophageal reflux disease without esophagitis: Secondary | ICD-10-CM | POA: Diagnosis present

## 2019-11-06 DIAGNOSIS — K2100 Gastro-esophageal reflux disease with esophagitis, without bleeding: Secondary | ICD-10-CM | POA: Diagnosis present

## 2019-11-06 DIAGNOSIS — Z86718 Personal history of other venous thrombosis and embolism: Secondary | ICD-10-CM

## 2019-11-06 DIAGNOSIS — R339 Retention of urine, unspecified: Secondary | ICD-10-CM | POA: Diagnosis present

## 2019-11-06 DIAGNOSIS — E782 Mixed hyperlipidemia: Secondary | ICD-10-CM | POA: Diagnosis not present

## 2019-11-06 DIAGNOSIS — M1909 Primary osteoarthritis, other specified site: Secondary | ICD-10-CM | POA: Diagnosis present

## 2019-11-06 DIAGNOSIS — E785 Hyperlipidemia, unspecified: Secondary | ICD-10-CM | POA: Diagnosis present

## 2019-11-06 DIAGNOSIS — G500 Trigeminal neuralgia: Secondary | ICD-10-CM | POA: Diagnosis present

## 2019-11-06 DIAGNOSIS — Z8673 Personal history of transient ischemic attack (TIA), and cerebral infarction without residual deficits: Secondary | ICD-10-CM | POA: Insufficient documentation

## 2019-11-06 DIAGNOSIS — I10 Essential (primary) hypertension: Secondary | ICD-10-CM | POA: Diagnosis present

## 2019-11-06 DIAGNOSIS — R4189 Other symptoms and signs involving cognitive functions and awareness: Secondary | ICD-10-CM | POA: Diagnosis present

## 2019-11-06 DIAGNOSIS — Z79899 Other long term (current) drug therapy: Secondary | ICD-10-CM | POA: Insufficient documentation

## 2019-11-06 DIAGNOSIS — Z87891 Personal history of nicotine dependence: Secondary | ICD-10-CM

## 2019-11-06 DIAGNOSIS — Z7901 Long term (current) use of anticoagulants: Secondary | ICD-10-CM

## 2019-11-06 DIAGNOSIS — G40909 Epilepsy, unspecified, not intractable, without status epilepticus: Secondary | ICD-10-CM | POA: Diagnosis present

## 2019-11-06 DIAGNOSIS — Z79890 Hormone replacement therapy (postmenopausal): Secondary | ICD-10-CM

## 2019-11-06 DIAGNOSIS — I480 Paroxysmal atrial fibrillation: Secondary | ICD-10-CM | POA: Insufficient documentation

## 2019-11-06 DIAGNOSIS — J45909 Unspecified asthma, uncomplicated: Secondary | ICD-10-CM | POA: Diagnosis present

## 2019-11-06 DIAGNOSIS — C787 Secondary malignant neoplasm of liver and intrahepatic bile duct: Secondary | ICD-10-CM | POA: Diagnosis not present

## 2019-11-06 DIAGNOSIS — K31811 Angiodysplasia of stomach and duodenum with bleeding: Secondary | ICD-10-CM | POA: Diagnosis not present

## 2019-11-06 DIAGNOSIS — C7A090 Malignant carcinoid tumor of the bronchus and lung: Secondary | ICD-10-CM | POA: Insufficient documentation

## 2019-11-06 DIAGNOSIS — D509 Iron deficiency anemia, unspecified: Secondary | ICD-10-CM | POA: Insufficient documentation

## 2019-11-06 DIAGNOSIS — F411 Generalized anxiety disorder: Secondary | ICD-10-CM | POA: Diagnosis present

## 2019-11-06 DIAGNOSIS — R079 Chest pain, unspecified: Secondary | ICD-10-CM | POA: Diagnosis not present

## 2019-11-06 DIAGNOSIS — C7B02 Secondary carcinoid tumors of liver: Secondary | ICD-10-CM | POA: Insufficient documentation

## 2019-11-06 DIAGNOSIS — Z881 Allergy status to other antibiotic agents status: Secondary | ICD-10-CM

## 2019-11-06 DIAGNOSIS — C169 Malignant neoplasm of stomach, unspecified: Secondary | ICD-10-CM | POA: Diagnosis not present

## 2019-11-06 DIAGNOSIS — Z7401 Bed confinement status: Secondary | ICD-10-CM

## 2019-11-06 DIAGNOSIS — K921 Melena: Secondary | ICD-10-CM | POA: Diagnosis not present

## 2019-11-06 LAB — CMP (CANCER CENTER ONLY)
ALT: 8 U/L (ref 0–44)
AST: 21 U/L (ref 15–41)
Albumin: 3 g/dL — ABNORMAL LOW (ref 3.5–5.0)
Alkaline Phosphatase: 115 U/L (ref 38–126)
Anion gap: 5 (ref 5–15)
BUN: 14 mg/dL (ref 8–23)
CO2: 25 mmol/L (ref 22–32)
Calcium: 8.6 mg/dL — ABNORMAL LOW (ref 8.9–10.3)
Chloride: 106 mmol/L (ref 98–111)
Creatinine: 0.83 mg/dL (ref 0.44–1.00)
GFR, Est AFR Am: >60 mL/min (ref >60)
GFR, Estimated: >60 mL/min (ref >60)
Glucose, Bld: 114 mg/dL — ABNORMAL HIGH (ref 70–99)
Potassium: 4.6 mmol/L (ref 3.5–5.1)
Sodium: 136 mmol/L (ref 135–145)
Total Bilirubin: 0.4 mg/dL (ref 0.3–1.2)
Total Protein: 5.6 g/dL — ABNORMAL LOW (ref 6.5–8.1)

## 2019-11-06 LAB — SARS CORONAVIRUS 2 (TAT 6-24 HRS): SARS Coronavirus 2: NEGATIVE

## 2019-11-06 LAB — POC OCCULT BLOOD, ED: Fecal Occult Bld: POSITIVE — AB

## 2019-11-06 LAB — CBC WITH DIFFERENTIAL (CANCER CENTER ONLY)
Abs Immature Granulocytes: 0.03 10*3/uL (ref 0.00–0.07)
Basophils Absolute: 0 10*3/uL (ref 0.0–0.1)
Basophils Relative: 1 %
Eosinophils Absolute: 0.1 10*3/uL (ref 0.0–0.5)
Eosinophils Relative: 5 %
HCT: 22.6 % — ABNORMAL LOW (ref 36.0–46.0)
Hemoglobin: 6.8 g/dL — CL (ref 12.0–15.0)
Immature Granulocytes: 1 %
Lymphocytes Relative: 19 %
Lymphs Abs: 0.5 10*3/uL — ABNORMAL LOW (ref 0.7–4.0)
MCH: 32.5 pg (ref 26.0–34.0)
MCHC: 30.1 g/dL (ref 30.0–36.0)
MCV: 108.1 fL — ABNORMAL HIGH (ref 80.0–100.0)
Monocytes Absolute: 0.4 10*3/uL (ref 0.1–1.0)
Monocytes Relative: 14 %
Neutro Abs: 1.6 10*3/uL — ABNORMAL LOW (ref 1.7–7.7)
Neutrophils Relative %: 60 %
Platelet Count: 77 10*3/uL — ABNORMAL LOW (ref 150–400)
RBC: 2.09 MIL/uL — ABNORMAL LOW (ref 3.87–5.11)
RDW: 20.5 % — ABNORMAL HIGH (ref 11.5–15.5)
WBC Count: 2.6 10*3/uL — ABNORMAL LOW (ref 4.0–10.5)
nRBC: 0.8 % — ABNORMAL HIGH (ref 0.0–0.2)

## 2019-11-06 LAB — PREPARE RBC (CROSSMATCH)

## 2019-11-06 LAB — PROTIME-INR
INR: 1.4 — ABNORMAL HIGH (ref 0.8–1.2)
Prothrombin Time: 17 seconds — ABNORMAL HIGH (ref 11.4–15.2)

## 2019-11-06 LAB — ABO/RH: ABO/RH(D): A POS

## 2019-11-06 MED ORDER — SODIUM CHLORIDE (PF) 0.9 % IJ SOLN
INTRAMUSCULAR | Status: AC
  Filled 2019-11-06: qty 50

## 2019-11-06 MED ORDER — HYPROMELLOSE (GONIOSCOPIC) 2.5 % OP SOLN
1.0000 [drp] | Freq: Every day | OPHTHALMIC | Status: DC
  Filled 2019-11-06 (×2): qty 15

## 2019-11-06 MED ORDER — SODIUM CHLORIDE 0.9 % IV SOLN: 1.0000 g | INTRAVENOUS | Status: DC

## 2019-11-06 MED ORDER — PRAVASTATIN SODIUM 40 MG PO TABS
40.0000 mg | ORAL_TABLET | Freq: Every day | ORAL | Status: DC
  Administered 2019-11-07 – 2019-11-09 (×3): 40 mg via ORAL
  Filled 2019-11-06 (×3): qty 1

## 2019-11-06 MED ORDER — SENNOSIDES-DOCUSATE SODIUM 8.6-50 MG PO TABS: 1.0000 | ORAL_TABLET | Freq: Every evening | ORAL | Status: DC | PRN

## 2019-11-06 MED ORDER — POLYVINYL ALCOHOL 1.4 % OP SOLN
1.0000 [drp] | Freq: Every day | OPHTHALMIC | Status: DC
  Administered 2019-11-07 – 2019-11-09 (×3): 1 [drp] via OPHTHALMIC
  Filled 2019-11-06 (×3): qty 15

## 2019-11-06 MED ORDER — FLUTICASONE PROPIONATE 50 MCG/ACT NA SUSP
2.0000 | Freq: Every day | NASAL | Status: DC
  Administered 2019-11-07 – 2019-11-08 (×2): 2 via NASAL
  Filled 2019-11-06 (×2): qty 16

## 2019-11-06 MED ORDER — ONDANSETRON HCL 4 MG PO TABS
8.0000 mg | ORAL_TABLET | Freq: Three times a day (TID) | ORAL | Status: DC | PRN
  Administered 2019-11-08: 8 mg via ORAL
  Filled 2019-11-06: qty 2

## 2019-11-06 MED ORDER — LEVOTHYROXINE SODIUM 25 MCG PO TABS
75.0000 ug | ORAL_TABLET | Freq: Every day | ORAL | Status: DC
  Administered 2019-11-07 – 2019-11-09 (×3): 75 ug via ORAL
  Filled 2019-11-06 (×3): qty 3

## 2019-11-06 MED ORDER — QUETIAPINE FUMARATE 25 MG PO TABS
25.0000 mg | ORAL_TABLET | Freq: Every day | ORAL | Status: DC
  Administered 2019-11-06 – 2019-11-08 (×3): 25 mg via ORAL
  Filled 2019-11-06 (×3): qty 1

## 2019-11-06 MED ORDER — SODIUM CHLORIDE 0.9 % IV SOLN
50.0000 ug/h | INTRAVENOUS | Status: DC
  Administered 2019-11-07 – 2019-11-08 (×4): 50 ug/h via INTRAVENOUS
  Filled 2019-11-06 (×4): qty 1

## 2019-11-06 MED ORDER — ACETAMINOPHEN 325 MG PO TABS
650.0000 mg | ORAL_TABLET | Freq: Four times a day (QID) | ORAL | Status: DC | PRN
  Administered 2019-11-07 – 2019-11-08 (×3): 650 mg via ORAL
  Filled 2019-11-06 (×5): qty 2

## 2019-11-06 MED ORDER — GABAPENTIN 400 MG PO CAPS
400.0000 mg | ORAL_CAPSULE | ORAL | Status: DC
  Administered 2019-11-08 – 2019-11-09 (×2): 400 mg via ORAL
  Filled 2019-11-06 (×2): qty 1

## 2019-11-06 MED ORDER — IOHEXOL 300 MG/ML  SOLN
100.0000 mL | Freq: Once | INTRAMUSCULAR | Status: AC | PRN
  Administered 2019-11-06: 100 mL via INTRAVENOUS

## 2019-11-06 MED ORDER — ACETAMINOPHEN 650 MG RE SUPP: 650.0000 mg | Freq: Four times a day (QID) | RECTAL | Status: DC | PRN

## 2019-11-06 MED ORDER — SODIUM CHLORIDE 0.9 % IV SOLN
2.0000 g | INTRAVENOUS | Status: DC
  Administered 2019-11-06: 2 g via INTRAVENOUS
  Filled 2019-11-06 (×2): qty 20

## 2019-11-06 MED ORDER — MORPHINE SULFATE (PF) 2 MG/ML IV SOLN
1.0000 mg | Freq: Once | INTRAVENOUS | Status: AC
  Administered 2019-11-06: 1 mg via INTRAVENOUS
  Filled 2019-11-06: qty 1

## 2019-11-06 MED ORDER — OCTREOTIDE LOAD VIA INFUSION
50.0000 ug | Freq: Once | INTRAVENOUS | Status: AC
  Administered 2019-11-07: 50 ug via INTRAVENOUS
  Filled 2019-11-06: qty 25

## 2019-11-06 MED ORDER — PANTOPRAZOLE SODIUM 40 MG IV SOLR
40.0000 mg | Freq: Two times a day (BID) | INTRAVENOUS | Status: DC
  Administered 2019-11-06 – 2019-11-09 (×6): 40 mg via INTRAVENOUS
  Filled 2019-11-06 (×6): qty 40

## 2019-11-06 MED ORDER — SODIUM CHLORIDE 0.9% IV SOLUTION: Freq: Once | INTRAVENOUS | Status: DC

## 2019-11-06 MED ORDER — OXCARBAZEPINE 150 MG PO TABS
150.0000 mg | ORAL_TABLET | Freq: Two times a day (BID) | ORAL | Status: DC
  Administered 2019-11-07 – 2019-11-09 (×6): 150 mg via ORAL
  Filled 2019-11-06 (×8): qty 1

## 2019-11-06 MED ORDER — GABAPENTIN 400 MG PO CAPS
800.0000 mg | ORAL_CAPSULE | Freq: Two times a day (BID) | ORAL | Status: DC
  Administered 2019-11-06 – 2019-11-09 (×6): 800 mg via ORAL
  Filled 2019-11-06 (×6): qty 2

## 2019-11-06 MED ORDER — ESCITALOPRAM OXALATE 20 MG PO TABS
20.0000 mg | ORAL_TABLET | Freq: Every day | ORAL | Status: DC
  Administered 2019-11-06 – 2019-11-08 (×3): 20 mg via ORAL
  Filled 2019-11-06 (×3): qty 1

## 2019-11-06 NOTE — H&P (Signed)
History and Physical    [name]:[id number] DOB: [birth year] DOA: 11/06/2019  PCP: [name], MD  Patient coming from: Home  I have personally briefly reviewed patient's old medical records in [place]  Chief Complaint: Melena  HPI: [name] is a 86 y.o. female with medical history significant of Metastatic neuroendocrine tumor of lung, Hx of PE (on Xarelto) and IVC filter placement, Paroxysmal Atrial Fibrillation/SSS s/p PPM, Cerebellar degeneration, TIA/CVA, Meningioma, Simple Partial Seizures, Burning tongue syndrome, HTN, HLD, Hypothryoidism, Depression and cognitive impairment presents with abnormal labs.  Patient underwent out-patient labs noted to have new drop in Hemoglobin from recent baseline of 10.5 to 6.8.  Patient reports she has 10 blood transfusions in the past but no source of bleeding found.  She states she has had both black stools and bright red blood in her stools but it has been more than a week since she had black stools.  She is a difficult historian and suspect this is combination of dementia and possibly prior CVA hx but is able to tell me she used Aleve recently.  She does report abdominal discomfort at times.  No fevers, chills.  No nausea/vomiting.  No new sob.  She states that has been going on forever but she does feel she gets tired even more quickly now.  She does ambulate with a seated walker.  No reported falls.  Subsequently able to speak with patient's husband: She lives at home with her husband; spoke with her husband and he reports she has only previously been transfused once in the past and that she did not have any adverse reactions.  He reports she is bed bound and wheelchair bound.  Per the husband she had been having melena for the past two months, it did appear to clear up for a bit but he reports again over the past week she has been having black stools again.  She had colonoscopy in 2017.  At that time, reported to have a  normal appearing colon.  Husband reports her brother was just diagnosed with Stage 3 colon cancer.  She denies smoking, alcohol or drugs  Review of Systems: As per HPI otherwise 10 point review of systems negative.    Past Medical History:  Diagnosis Date  . Acute respiratory failure with hypoxia ([place]) 10/23/2015  . Allergic rhinitis    PT. DENIES  . Anxiety   . Aortic insufficiency    Echo 04/29/2018: EF 65-70, mild AS (mean 13), mod AI, Asc Aorta 42 mm (mildly dilated), mild LAE, PASP 41, pericardium normal in appearance.   . Arthritis    NECK  . Ataxia   . Back pain 12/13/2016  . Bradycardia    primarily nocturnal  . Burning tongue syndrome 25 years  . Cancer ([place]) dx'd 06/2016   liver  . Cataract   . Cerebellar degeneration ([place])   . Chest pain 12/13/2016   Atypical chest pain  . Chronic urinary tract infection   . Complication of anesthesia    low o2 sats, coded 30 years ago  . CVA (cerebral infarction) 05/2003  . Dehydration with hyponatremia 03/23/2018  . Depression   . Encounter for antineoplastic chemotherapy 10/09/2016  . Fatigue 01/03/2015  . Gait disorder   . Gastric polyps   . GERD (gastroesophageal reflux disease)   . Goals of care, counseling/discussion 10/09/2016  . H/O: CVA (cerebrovascular accident) 07/14/2016  . High cholesterol   . History of pericarditis   . Hyperlipidemia   .  Hypotension   . Hypothyroidism   . IBS (irritable bowel syndrome)   . Neuroendocrine cancer ([place]) 08/23/2016  . Obesity   . Paroxysmal atrial fibrillation ([name])    chads2vasc score is 6,  she is felt to be a poor candidate for anticoagulation  . Pericarditis   . Personal history of arterial venous malformation (AVM)    right side of face  . Seizure disorder ([place])   . Seizures ([place]) 2003   " smelling"- Gabapentin "no problem"  . Shortness of breath dyspnea    with exertion  . Sick sinus syndrome ([place])   . Sternum fx 10/27/2013  . Stroke [facility] 5 years ago   Right side of  face weak, slurred speach-   . Thyroid disease   . TIA (transient ischemic attack)   . UTI (lower urinary tract infection) 03/27/2016   "frequently"    Past Surgical History:  Procedure Laterality Date  . APPENDECTOMY  86 years old  . COLONOSCOPY  2006, 2009  . COLONOSCOPY WITH PROPOFOL N/A 03/28/2016   Procedure: COLONOSCOPY WITH PROPOFOL;  Surgeon: Gatha Mayer, MD;  Location: [facility];  Service: Endoscopy;  Laterality: N/A;  . CORONARY ANGIOGRAPHY N/A 05/22/2018   Procedure: CORONARY ANGIOGRAPHY (CATH LAB);  Surgeon: Lorretta Harp, MD;  Location: [facility];  Service: Cardiovascular;  Laterality: N/A;  . cyst removed  35 years ago  . EP IMPLANTABLE DEVICE N/A 01/06/2015   Procedure: Loop Recorder Insertion;  Surgeon: Thompson Grayer, MD;  Location: [facility];  Service: Cardiovascular;  Laterality: N/A;  . EYE SURGERY Right    Cataract  . IR ANGIOGRAM SELECTIVE EACH ADDITIONAL VESSEL  11/28/2016  . IR ANGIOGRAM SELECTIVE EACH ADDITIONAL VESSEL  11/28/2016  . IR ANGIOGRAM SELECTIVE EACH ADDITIONAL VESSEL  11/28/2016  . IR ANGIOGRAM SELECTIVE EACH ADDITIONAL VESSEL  11/28/2016  . IR ANGIOGRAM SELECTIVE EACH ADDITIONAL VESSEL  12/13/2016  . IR ANGIOGRAM SELECTIVE EACH ADDITIONAL VESSEL  12/13/2016  . IR ANGIOGRAM SELECTIVE EACH ADDITIONAL VESSEL  01/09/2017  . IR ANGIOGRAM VISCERAL SELECTIVE  11/28/2016  . IR ANGIOGRAM VISCERAL SELECTIVE  11/28/2016  . IR ANGIOGRAM VISCERAL SELECTIVE  12/13/2016  . IR ANGIOGRAM VISCERAL SELECTIVE  12/13/2016  . IR ANGIOGRAM VISCERAL SELECTIVE  01/09/2017  . IR EMBO ARTERIAL NOT HEMORR HEMANG INC GUIDE ROADMAPPING  11/28/2016  . IR EMBO TUMOR ORGAN ISCHEMIA INFARCT INC GUIDE ROADMAPPING  12/13/2016  . IR EMBO TUMOR ORGAN ISCHEMIA INFARCT INC GUIDE ROADMAPPING  01/09/2017  . IR IVC FILTER PLMT / S&I /IMG GUID/MOD SED  04/24/2017  . IR RADIOLOGIST EVAL & MGMT  11/06/2016  . IR RADIOLOGIST EVAL & MGMT  01/02/2017  . IR RADIOLOGIST EVAL & MGMT  02/05/2017    . IR RADIOLOGIST EVAL & MGMT  05/02/2017  . IR RADIOLOGIST EVAL & MGMT  10/17/2017  . IR US GUIDE VASC ACCESS RIGHT  11/28/2016  . IR US GUIDE VASC ACCESS RIGHT  12/13/2016  . IR US GUIDE VASC ACCESS RIGHT  01/09/2017  . KNEE ARTHROSCOPY Right 11/14/2006  . KNEE ARTHROSCOPY Bilateral 5 and 6 years ago  . KNEE ARTHROSCOPY WITH LATERAL MENISECTOMY  07/03/2012   Procedure: KNEE ARTHROSCOPY WITH LATERAL MENISECTOMY;  Surgeon: Magnus Sinning, MD;  Location: [facility];  Service: Orthopedics;  Laterality: Left;  with Partial Lateral Menisectomy and Medial Menisectomy. Shaving of medial and lateral femoral condyles. Shaving of patella. Removal of a loose body  . LOOP RECORDER REMOVAL N/A 05/22/2018   Procedure:  LOOP RECORDER REMOVAL;  Surgeon: Thompson Grayer, MD;  Location: [facility];  Service: Cardiovascular;  Laterality: N/A;  . PACEMAKER IMPLANT N/A 05/22/2018   MDT Azure XT DR MRI implanted by Dr Rayann Heman for sick sinus syndrome  . tibial and fibular internal fixation Left   . TOTAL ABDOMINAL HYSTERECTOMY  86 years old  . UPPER GASTROINTESTINAL ENDOSCOPY  2009, 2013     reports that she quit smoking about 44 years ago. Her smoking use included cigarettes. She has a 2.50 pack-year smoking history. She has never used smokeless tobacco. She reports that she does not drink alcohol or use drugs.  Allergies  Allergen Reactions  . Levaquin [Levofloxacin] Shortness Of Breath  . Lisinopril Cough    Family History  Problem Relation Age of Onset  . Heart attack Father 32       fatal  . Coronary artery disease Brother   . Diabetes Brother   . Prostate cancer Brother   . Prostate cancer Son      Prior to Admission medications   Medication Sig Start Date End Date Taking? Authorizing Provider  amitriptyline (ELAVIL) 25 MG tablet TAKE 1 TABLET AT BEDTIME Patient not taking: Reported on 10/06/2019 04/08/19   [name], MD  amLODipine (NORVASC) 2.5 MG tablet TAKE 1 TABLET DAILY 09/22/19   [name], MD  capecitabine (XELODA) 500 MG tablet TAKE 3 TABLETS (1500MG ) BY MOUTH TWICE DAILY AFTER A MEAL. TAKE ON DAYS 1-14 OF EACH 28 DAY CYCLE 09/10/19   Heilingoetter, Cassandra L, PA-C  escitalopram (LEXAPRO) 20 MG tablet TAKE 1 TABLET AT BEDTIME 04/08/19   [name], MD  esomeprazole (NEXIUM) 40 MG capsule Take 1 capsule (40 mg total) by mouth 2 (two) times daily before a meal. 06/17/19   [name], MD  fluticasone (FLONASE) 50 MCG/ACT nasal spray Place 2 sprays into both nostrils at bedtime. 04/14/19   [name], MD  furosemide (LASIX) 20 MG tablet TAKE 1 TABLET DAILY AS NEEDED FOR FLUID RETENTION 10/13/19   [name], MD  gabapentin (NEURONTIN) 400 MG capsule TAKE 1 TO 2 CAPSULES THREE TIMES A DAY 04/14/19   [name], MD  hydroxypropyl methylcellulose / hypromellose (ISOPTO TEARS / GONIOVISC) 2.5 % ophthalmic solution Place 1 drop into both eyes daily.     [provider]  levothyroxine (SYNTHROID) 75 MCG tablet Take 1 tablet (75 mcg total) by mouth daily before breakfast. 04/14/19   [name], MD  lovastatin (MEVACOR) 40 MG tablet TAKE 1 TABLET AT BEDTIME 10/13/19   [name], MD  Melatonin 3 MG TBDP Take 3-6 mg by mouth at bedtime as needed. 08/02/16   Eustaquio Maize, MD  ondansetron (ZOFRAN) 8 MG tablet Take 1 tablet (8 mg total) by mouth every 8 (eight) hours as needed for nausea or vomiting. 04/14/19   [name], MD  OXcarbazepine (TRILEPTAL) 150 MG tablet Take 1 tablet (150 mg total) by mouth 2 (two) times daily. 04/14/19   [name], MD  QUEtiapine (SEROQUEL) 25 MG tablet Take 1 tablet (25 mg total) by mouth at bedtime. 10/26/19   Pieter Partridge, DO  rivaroxaban (XARELTO) 20 MG TABS tablet TAKE 1 TABLET DAILY WITH SUPPER 04/14/19   [name], MD  silver sulfADIAZINE (SILVADENE) 1 % cream Apply 1 application topically daily. 06/17/19   [name], MD  temozolomide (TEMODAR) 140 MG capsule TAKE 2 CAPSULES BY MOUTH DAILY FOR 5 DAYS,ON  DAYS 10-14 OF EACH 28DAY CYCLE. MAY TAKE ON EMPTY  STOMACH AT BEDTIME TO DECREASE NAUSEA & VOMITING 09/10/19   Heilingoetter, Tobe Sos, PA-C    Physical Exam: Vitals:   11/06/19 1204 11/06/19 1206 11/06/19 1312  BP: (!) 147/51  (!) 174/63  Pulse: (!) 59  71  Resp: 18  19  Temp: 98 F (36.7 C)    TempSrc: Oral    SpO2: 99%  98%  Weight:  90.7 kg   Height:  5\' 4"  (1.626 m)     Vitals:   11/06/19 1204 11/06/19 1206 11/06/19 1312  BP: (!) 147/51  (!) 174/63  Pulse: (!) 59  71  Resp: 18  19  Temp: 98 F (36.7 C)    TempSrc: Oral    SpO2: 99%  98%  Weight:  90.7 kg   Height:  5\' 4"  (1.626 m)      Constitutional: pleasant, difficult to understand at times/dysarthria and at times appears to have some expressive aphasia (at baseline), no distress Eyes: PERRL, lids and conjunctivae normal ENMT: Mucous membranes are moist. Posterior pharynx clear of any exudate or lesions.Normal dentition.  Neck: normal, supple, no masses, no thyromegaly Respiratory: clear to auscultation bilaterally, no wheezing, no crackles. Normal respiratory effort. No accessory muscle use.  Cardiovascular: Regular rate and rhythm, no murmurs / rubs / gallops. No extremity edema. 2+ pedal pulses. No carotid bruits. BL 2+ pitting edema Abdomen: diffusely tender, obese, distended, no rebound or guarding. No hepatosplenomegaly. Bowel sounds positive.  Musculoskeletal: no clubbing / cyanosis. Normal tone Skin: no rashes, lesions, ulcers. No induration Neurologic: Limited exam, moving all extremities at level of bed, 3/5 BL LE, 4/5 BL UE Psychiatric: Guarded judgment and insight   Labs on Admission: I have personally reviewed following labs and imaging studies  CBC: Recent Labs  Lab 11/06/19 1024  WBC 2.6*  NEUTROABS 1.6*  HGB 6.8*  HCT 22.6*  MCV 108.1*  PLT 77*   Basic Metabolic Panel: Recent Labs  Lab 11/06/19 1024  NA 136  K 4.6  CL 106  CO2 25  GLUCOSE 114*  BUN 14  CREATININE 0.83    CALCIUM 8.6*   GFR: Estimated Creatinine Clearance: 56 mL/min (by C-G formula based on SCr of 0.83 mg/dL). Liver Function Tests: Recent Labs  Lab 11/06/19 1024  AST 21  ALT 8  ALKPHOS 115  BILITOT 0.4  PROT 5.6*  ALBUMIN 3.0*   No results for input(s): LIPASE, AMYLASE in the last 168 hours. No results for input(s): AMMONIA in the last 168 hours. Coagulation Profile: No results for input(s): INR, PROTIME in the last 168 hours. Cardiac Enzymes: No results for input(s): CKTOTAL, CKMB, CKMBINDEX, TROPONINI in the last 168 hours. BNP (last 3 results) No results for input(s): PROBNP in the last 8760 hours. HbA1C: No results for input(s): HGBA1C in the last 72 hours. CBG: No results for input(s): GLUCAP in the last 168 hours. Lipid Profile: No results for input(s): CHOL, HDL, LDLCALC, TRIG, CHOLHDL, LDLDIRECT in the last 72 hours. Thyroid Function Tests: No results for input(s): TSH, T4TOTAL, FREET4, T3FREE, THYROIDAB in the last 72 hours. Anemia Panel: No results for input(s): VITAMINB12, FOLATE, FERRITIN, TIBC, IRON, RETICCTPCT in the last 72 hours. Urine analysis:    Component Value Date/Time   COLORURINE YELLOW 03/24/2018 1310   APPEARANCEUR Cloudy (A) 04/14/2019 1555   LABSPEC 1.009 03/24/2018 1310   PHURINE 6.0 03/24/2018 1310   GLUCOSEU Negative 04/14/2019 1555   HGBUR LARGE (A) 03/24/2018 1310   BILIRUBINUR Negative 04/14/2019 [place] 03/24/2018  1310   PROTEINUR Negative 04/14/2019 [place] 03/24/2018 1310   UROBILINOGEN negative 05/24/2015 1434   NITRITE Positive (A) 04/14/2019 1555   NITRITE NEGATIVE 03/24/2018 1310   LEUKOCYTESUR 1+ (A) 04/14/2019 1555    Radiological Exams on Admission: No results found.   Assessment/Plan [name] is a 86 y.o. female with medical history significant of Metastatic neuroendocrine tumor of lung, Hx of PE (on Xarelto) and IVC filter placement, Paroxysmal Atrial Fibrillation/SSS s/p  PPM, Cerebellar degeneration, TIA/CVA, Meningioma, Simple Partial Seizures, Burning tongue syndrome, HTN, HLD, Hypothryoidism, Depression and cognitive impairment presents with anemia.  # Subacute blood loss anemia # Symptomatic anemia - patient is on anticoagulation for hx of PE, also with Paroxysmal A. Fib and hx of hypercoaguability in setting of neuroendocrine tumor.  Now with Hgb of 6.8. - patient and husband have consented to blood products, has had transfusions twice in the past - ordered for both BID PPI and Octreotide (due to hx of liver mets and possible elevation of portal pressures) and will give single dose of ceftriaxone (pending endoscopic eval can decide to continue) - will transfuse 1 unit, monitor H/H overnight  - she has multiple comorbidities, but given ongoing need for anticoagulation, GI has been consulted from ER for possible endoscopic evaluation  # Metastatic Neuroendocrine tumor of the lung # Hx of DVT/PE - follows with Dr. [name] - receiving chemo, s/p radioembolization of liver mets - holding anticoagulation temporarily pending GI evaluation, may need Heparin gtt as a bridge pending plan  # Paroxysmal Atrial Fibrillation/SSS s/p PPM - held Xarelto   # Meningioma - stable in size, following with neurology, Dr. Tomi Likens  # Dementia with behavioral disturbance - delirium precautions - continue seroquel  # Burning tongue syndrome - previously on amitriptyline qHS, now just gabapentin and oxcarbazepine  # Simple partial seizures - on Oxcarbazepine 150 mg BID  # Trigeminal Neuralgia - on oxcarbazepine, gabapentin  # Hypothyroidism - continue pta levothyroxine  # HTN - held anti-hypertensives / lasix for now  # HLD - continue statin  # Hypothyroidism - continue levothyroxine  DVT prophylaxis: SCDs Code Status: Full Family Communication: Spoke with patient's spouse, Bailei Buist [phone]) Disposition Plan: Pending Consults called: GI Consulted  from ER Admission status: inpatient   Truddie Hidden MD [facility] Pager [phone]  If 7PM-7AM, please contact night-coverage [email or web address] Password [email or web address]  11/06/2019, 1:36 PM

## 2019-11-06 NOTE — ED Triage Notes (Signed)
Pt had labs and CT today, rectal bleeding and low hgb

## 2019-11-06 NOTE — ED Notes (Addendum)
Rebekah Racer, RN and [place], NT cleaned up patient after bowel movement. Patient washed, given new brief, and is comfortable and dry. Patient now comfortable using purewick. 300 mL of urine in suction cannister. Will continue to monitor patient. GI PA-C paged that patient was able to urinate to d/c foley.

## 2019-11-06 NOTE — Consult Note (Signed)
Consultation  Referring Provider: TRH/Aravind MD  primary Care Physician:  [name], MD Primary Gastroenterologist:  Dr.Gessner  Reason for Consultation: Drop in hemoglobin, melena  HPI: [name] is a 86 y.o. female , known to Dr. Carlean Purl from previous procedures.  We are asked to see today after admission through the emergency room after she was found to have abnormal labs as an outpatient with hemoglobin of 6.8. Patient has multiple serious medical issues including metastatic neuroendocrine tumor of the lung for which she is undergoing treatment per Dr. Earlie Server with Xeloda and Temodar.  She has been on Xarelto for history of PE.  Also with history of atrial fibrillation and is status post pacemaker placement.  She has history of previous TIA/CVA, meningioma, simple partial seizures and cerebellar degeneration with cognitive impairment. Patient has history of a chronic anemia which is being followed, and actually has a pancytopenia.  Her baseline hemoglobin has been in the 10 range over the past several months and at times has dipped into the 8 range.  WBC generally about 2.5-3 and platelets in the 70 range. She has required occasional transfusion and did have a blood transfusion in February x1. She is a difficult historian but says she has been noticing black appearing stools.  Per her chart her husband says she has been having dark stools over the past couple of months.  She has some component of ongoing abdominal pain, no recent nausea or vomiting, denies heartburn or dysphagia.  She has been chair or bed bound. Patient had colonoscopy in 2017 for diarrhea which was a completely normal exam with good prep.  She last had EGD in 2013, with finding of a tortuous esophagus, she was dilated to 54, noted to have multiple sessile polyps in the gastric body and fundus.  Path on the polyps consistent with fundic gland polyps.  Patient underwent imaging today for staging which included  CT of the chest abdomen and pelvis.  Pulmonary and hepatic metastatic disease appears stable, she has a contracted gallbladder with stones, minimally enlarged left axillary lymph nodes.  Today's labs INR 1.4, BUN 14/creatinine 0.83 Hemoglobin 6.8 platelets 77 and WBC of 2.6.  She last took Xarelto yesterday afternoon.  She is upset currently due to urinary urgency.  She had an in and out cath earlier with good relief but says she needs to urinate and have a bowel movement and is very uncomfortable.      Past Medical History:  Diagnosis Date  . Acute respiratory failure with hypoxia ([place]) 10/23/2015  . Allergic rhinitis    PT. DENIES  . Anxiety   . Aortic insufficiency    Echo 04/29/2018: EF 65-70, mild AS (mean 13), mod AI, Asc Aorta 42 mm (mildly dilated), mild LAE, PASP 41, pericardium normal in appearance.   . Arthritis    NECK  . Ataxia   . Back pain 12/13/2016  . Bradycardia    primarily nocturnal  . Burning tongue syndrome 25 years  . Cancer ([place]) dx'd 06/2016   liver  . Cataract   . Cerebellar degeneration ([place])   . Chest pain 12/13/2016   Atypical chest pain  . Chronic urinary tract infection   . Complication of anesthesia    low o2 sats, coded 30 years ago  . CVA (cerebral infarction) 05/2003  . Dehydration with hyponatremia 03/23/2018  . Depression   . Encounter for antineoplastic chemotherapy 10/09/2016  . Fatigue 01/03/2015  . Gait disorder   . Gastric polyps   .  GERD (gastroesophageal reflux disease)   . Goals of care, counseling/discussion 10/09/2016  . H/O: CVA (cerebrovascular accident) 07/14/2016  . High cholesterol   . History of pericarditis   . Hyperlipidemia   . Hypotension   . Hypothyroidism   . IBS (irritable bowel syndrome)   . Neuroendocrine cancer ([place]) 08/23/2016  . Obesity   . Paroxysmal atrial fibrillation ([name])    chads2vasc score is 6,  she is felt to be a poor candidate for anticoagulation  . Pericarditis   . Personal history of arterial  venous malformation (AVM)    right side of face  . Seizure disorder ([place])   . Seizures ([place]) 2003   " smelling"- Gabapentin "no problem"  . Shortness of breath dyspnea    with exertion  . Sick sinus syndrome ([place])   . Sternum fx 10/27/2013  . Stroke [facility]) 5 years ago   Right side of face weak, slurred speach-   . Thyroid disease   . TIA (transient ischemic attack)   . UTI (lower urinary tract infection) 03/27/2016   "frequently"    Past Surgical History:  Procedure Laterality Date  . APPENDECTOMY  86 years old  . COLONOSCOPY  2006, 2009  . COLONOSCOPY WITH PROPOFOL N/A 03/28/2016   Procedure: COLONOSCOPY WITH PROPOFOL;  Surgeon: Gatha Mayer, MD;  Location: [facility];  Service: Endoscopy;  Laterality: N/A;  . CORONARY ANGIOGRAPHY N/A 05/22/2018   Procedure: CORONARY ANGIOGRAPHY (CATH LAB);  Surgeon: Lorretta Harp, MD;  Location: [place] [facility];  Service: Cardiovascular;  Laterality: N/A;  . cyst removed  35 years ago  . EP IMPLANTABLE DEVICE N/A 01/06/2015   Procedure: Loop Recorder Insertion;  Surgeon: Thompson Grayer, MD;  Location: [place] [facility];  Service: Cardiovascular;  Laterality: N/A;  . EYE SURGERY Right    Cataract  . IR ANGIOGRAM SELECTIVE EACH ADDITIONAL VESSEL  11/28/2016  . IR ANGIOGRAM SELECTIVE EACH ADDITIONAL VESSEL  11/28/2016  . IR ANGIOGRAM SELECTIVE EACH ADDITIONAL VESSEL  11/28/2016  . IR ANGIOGRAM SELECTIVE EACH ADDITIONAL VESSEL  11/28/2016  . IR ANGIOGRAM SELECTIVE EACH ADDITIONAL VESSEL  12/13/2016  . IR ANGIOGRAM SELECTIVE EACH ADDITIONAL VESSEL  12/13/2016  . IR ANGIOGRAM SELECTIVE EACH ADDITIONAL VESSEL  01/09/2017  . IR ANGIOGRAM VISCERAL SELECTIVE  11/28/2016  . IR ANGIOGRAM VISCERAL SELECTIVE  11/28/2016  . IR ANGIOGRAM VISCERAL SELECTIVE  12/13/2016  . IR ANGIOGRAM VISCERAL SELECTIVE  12/13/2016  . IR ANGIOGRAM VISCERAL SELECTIVE  01/09/2017  . IR EMBO ARTERIAL NOT HEMORR HEMANG INC GUIDE ROADMAPPING  11/28/2016  . IR EMBO TUMOR ORGAN ISCHEMIA  INFARCT INC GUIDE ROADMAPPING  12/13/2016  . IR EMBO TUMOR ORGAN ISCHEMIA INFARCT INC GUIDE ROADMAPPING  01/09/2017  . IR IVC FILTER PLMT / S&I /IMG GUID/MOD SED  04/24/2017  . IR RADIOLOGIST EVAL & MGMT  11/06/2016  . IR RADIOLOGIST EVAL & MGMT  01/02/2017  . IR RADIOLOGIST EVAL & MGMT  02/05/2017  . IR RADIOLOGIST EVAL & MGMT  05/02/2017  . IR RADIOLOGIST EVAL & MGMT  10/17/2017  . IR US GUIDE VASC ACCESS RIGHT  11/28/2016  . IR US GUIDE VASC ACCESS RIGHT  12/13/2016  . IR US GUIDE VASC ACCESS RIGHT  01/09/2017  . KNEE ARTHROSCOPY Right 11/14/2006  . KNEE ARTHROSCOPY Bilateral 5 and 6 years ago  . KNEE ARTHROSCOPY WITH LATERAL MENISECTOMY  07/03/2012   Procedure: KNEE ARTHROSCOPY WITH LATERAL MENISECTOMY;  Surgeon: Magnus Sinning, MD;  Location: [facility];  Service: Orthopedics;  Laterality: Left;  with Partial Lateral Menisectomy and Medial Menisectomy. Shaving of medial and lateral femoral condyles. Shaving of patella. Removal of a loose body  . LOOP RECORDER REMOVAL N/A 05/22/2018   Procedure: LOOP RECORDER REMOVAL;  Surgeon: Thompson Grayer, MD;  Location: [facility];  Service: Cardiovascular;  Laterality: N/A;  . PACEMAKER IMPLANT N/A 05/22/2018   MDT Azure XT DR MRI implanted by Dr Rayann Heman for sick sinus syndrome  . tibial and fibular internal fixation Left   . TOTAL ABDOMINAL HYSTERECTOMY  86 years old  . UPPER GASTROINTESTINAL ENDOSCOPY  2009, 2013    Prior to Admission medications   Medication Sig Start Date End Date Taking? Authorizing Provider  amLODipine (NORVASC) 2.5 MG tablet TAKE 1 TABLET DAILY Patient taking differently: Take 2.5 mg by mouth daily.  09/22/19  Yes Stacks, Cletus Gash, MD  capecitabine (XELODA) 500 MG tablet TAKE 3 TABLETS (1500MG ) BY MOUTH TWICE DAILY AFTER A MEAL. TAKE ON DAYS 1-14 OF EACH 28 DAY CYCLE 09/10/19  Yes Heilingoetter, Cassandra L, PA-C  escitalopram (LEXAPRO) 20 MG tablet TAKE 1 TABLET AT BEDTIME Patient taking differently: Take 20 mg by mouth at bedtime.   04/08/19  Yes [name], MD  esomeprazole (NEXIUM) 40 MG capsule Take 1 capsule (40 mg total) by mouth 2 (two) times daily before a meal. 06/17/19  Yes Stacks, Cletus Gash, MD  fluticasone (FLONASE) 50 MCG/ACT nasal spray Place 2 sprays into both nostrils at bedtime. 04/14/19  Yes Stacks, Cletus Gash, MD  furosemide (LASIX) 20 MG tablet TAKE 1 TABLET DAILY AS NEEDED FOR FLUID RETENTION Patient taking differently: Take 20 mg by mouth daily as needed for fluid.  10/13/19  Yes Stacks, Cletus Gash, MD  gabapentin (NEURONTIN) 400 MG capsule TAKE 1 TO 2 CAPSULES THREE TIMES A DAY Patient taking differently: Take 400-800 mg by mouth 3 (three) times daily.  04/14/19  Yes Stacks, Cletus Gash, MD  hydroxypropyl methylcellulose / hypromellose (ISOPTO TEARS / GONIOVISC) 2.5 % ophthalmic solution Place 1 drop into both eyes daily.    Yes [provider]  levothyroxine (SYNTHROID) 75 MCG tablet Take 1 tablet (75 mcg total) by mouth daily before breakfast. 04/14/19  Yes Stacks, Cletus Gash, MD  lovastatin (MEVACOR) 40 MG tablet TAKE 1 TABLET AT BEDTIME Patient taking differently: Take 40 mg by mouth at bedtime.  10/13/19  Yes Stacks, Cletus Gash, MD  Melatonin 3 MG TBDP Take 3-6 mg by mouth at bedtime as needed. Patient taking differently: Take 3-6 mg by mouth at bedtime as needed (sleep).  08/02/16  Yes Eustaquio Maize, MD  ondansetron (ZOFRAN) 8 MG tablet Take 1 tablet (8 mg total) by mouth every 8 (eight) hours as needed for nausea or vomiting. 04/14/19  Yes [name], MD  OXcarbazepine (TRILEPTAL) 150 MG tablet Take 1 tablet (150 mg total) by mouth 2 (two) times daily. 04/14/19  Yes [name], MD  QUEtiapine (SEROQUEL) 25 MG tablet Take 1 tablet (25 mg total) by mouth at bedtime. 10/26/19  Yes Tomi Likens, Adam R, DO  rivaroxaban (XARELTO) 20 MG TABS tablet TAKE 1 TABLET DAILY WITH SUPPER Patient taking differently: Take 20 mg by mouth daily with supper.  04/14/19  Yes Stacks, Cletus Gash, MD  silver sulfADIAZINE (SILVADENE) 1 % cream  Apply 1 application topically daily. 06/17/19  Yes Stacks, Cletus Gash, MD  temozolomide (TEMODAR) 140 MG capsule TAKE 2 CAPSULES BY MOUTH DAILY FOR 5 DAYS,ON DAYS 10-14 OF EACH 28DAY CYCLE. MAY TAKE ON EMPTY STOMACH AT BEDTIME TO DECREASE NAUSEA & VOMITING 09/10/19  Yes Heilingoetter, Cassandra L, PA-C  amitriptyline (ELAVIL) 25 MG tablet TAKE 1 TABLET AT BEDTIME Patient not taking: Reported on 10/06/2019 04/08/19   [name], MD    Current Facility-Administered Medications  Medication Dose Route Frequency Provider Last Rate Last Admin  . 0.9 %  sodium chloride infusion (Manually program via Guardrails IV Fluids)   Intravenous Once Truddie Hidden, MD   Stopped at 11/06/19 1613  . cefTRIAXone (ROCEPHIN) 2 g in sodium chloride 0.9 % 100 mL IVPB  2 g Intravenous Q24H Stark Klein, MD      . pantoprazole (PROTONIX) injection 40 mg  40 mg Intravenous Q12H Truddie Hidden, MD       Current Outpatient Medications  Medication Sig Dispense Refill  . amLODipine (NORVASC) 2.5 MG tablet TAKE 1 TABLET DAILY (Patient taking differently: Take 2.5 mg by mouth daily. ) 90 tablet 0  . capecitabine (XELODA) 500 MG tablet TAKE 3 TABLETS (1500MG ) BY MOUTH TWICE DAILY AFTER A MEAL. TAKE ON DAYS 1-14 OF EACH 28 DAY CYCLE 84 tablet 2  . escitalopram (LEXAPRO) 20 MG tablet TAKE 1 TABLET AT BEDTIME (Patient taking differently: Take 20 mg by mouth at bedtime. ) 90 tablet 3  . esomeprazole (NEXIUM) 40 MG capsule Take 1 capsule (40 mg total) by mouth 2 (two) times daily before a meal. 180 capsule 1  . fluticasone (FLONASE) 50 MCG/ACT nasal spray Place 2 sprays into both nostrils at bedtime. 48 g 3  . furosemide (LASIX) 20 MG tablet TAKE 1 TABLET DAILY AS NEEDED FOR FLUID RETENTION (Patient taking differently: Take 20 mg by mouth daily as needed for fluid. ) 90 tablet 3  . gabapentin (NEURONTIN) 400 MG capsule TAKE 1 TO 2 CAPSULES THREE TIMES A DAY (Patient taking differently: Take 400-800 mg by mouth 3 (three) times daily. )  720 capsule 1  . hydroxypropyl methylcellulose / hypromellose (ISOPTO TEARS / GONIOVISC) 2.5 % ophthalmic solution Place 1 drop into both eyes daily.     Marland Kitchen levothyroxine (SYNTHROID) 75 MCG tablet Take 1 tablet (75 mcg total) by mouth daily before breakfast. 90 tablet 1  . lovastatin (MEVACOR) 40 MG tablet TAKE 1 TABLET AT BEDTIME (Patient taking differently: Take 40 mg by mouth at bedtime. ) 90 tablet 3  . Melatonin 3 MG TBDP Take 3-6 mg by mouth at bedtime as needed. (Patient taking differently: Take 3-6 mg by mouth at bedtime as needed (sleep). ) 60 tablet 1  . ondansetron (ZOFRAN) 8 MG tablet Take 1 tablet (8 mg total) by mouth every 8 (eight) hours as needed for nausea or vomiting. 90 tablet 1  . OXcarbazepine (TRILEPTAL) 150 MG tablet Take 1 tablet (150 mg total) by mouth 2 (two) times daily. 180 tablet 3  . QUEtiapine (SEROQUEL) 25 MG tablet Take 1 tablet (25 mg total) by mouth at bedtime. 90 tablet 1  . rivaroxaban (XARELTO) 20 MG TABS tablet TAKE 1 TABLET DAILY WITH SUPPER (Patient taking differently: Take 20 mg by mouth daily with supper. ) 90 tablet 3  . silver sulfADIAZINE (SILVADENE) 1 % cream Apply 1 application topically daily. 400 g 0  . temozolomide (TEMODAR) 140 MG capsule TAKE 2 CAPSULES BY MOUTH DAILY FOR 5 DAYS,ON DAYS 10-14 OF EACH 28DAY CYCLE. MAY TAKE ON EMPTY STOMACH AT BEDTIME TO DECREASE NAUSEA & VOMITING 10 capsule 2  . amitriptyline (ELAVIL) 25 MG tablet TAKE 1 TABLET AT BEDTIME (Patient not taking: Reported on 10/06/2019) 90 tablet 3   Facility-Administered Medications Ordered in Other Encounters  Medication Dose Route Frequency Provider Last Rate Last Admin  . heparin lock flush 100 unit/mL  500 Units Intracatheter Daily PRN Heilingoetter, Cassandra L, PA-C      . sodium chloride (PF) 0.9 % injection             Allergies as of 11/06/2019 - Review Complete 11/06/2019  Allergen Reaction Noted  . Levaquin [levofloxacin] Shortness Of Breath 10/13/2018  . Lisinopril  Cough 05/02/2015    Family History  Problem Relation Age of Onset  . Heart attack Father 30       fatal  . Coronary artery disease Brother   . Diabetes Brother   . Prostate cancer Brother   . Prostate cancer Son     Social History   Socioeconomic History  . Marital status: Married    Spouse name: Shaunika Italiano  . Number of children: 6  . Years of education: 84  . Highest education level: 10th grade  Occupational History  . Occupation: Homemaker  Tobacco Use  . Smoking status: Former Smoker    Packs/day: 0.25    Years: 10.00    Pack years: 2.50    Types: Cigarettes    Quit date: 07/31/1975    Years since quitting: 44.2  . Smokeless tobacco: Never Used  Substance and Sexual Activity  . Alcohol use: No    Comment: Rare- maybe a drink ever 2 years  . Drug use: No  . Sexual activity: Not Currently  Other Topics Concern  . Not on file  Social History Narrative   Lives with husband, does have stairs, does not use them. Pt completed 10th grade.   Right handed   Social Determinants of Health   Financial Resource Strain:   . Difficulty of Paying Living Expenses:   Food Insecurity:   . Worried About Charity fundraiser in the Last Year:   . Arboriculturist in the Last Year:   Transportation Needs:   . Film/video editor (Medical):   Marland Kitchen Lack of Transportation (Non-Medical):   Physical Activity:   . Days of Exercise per Week:   . Minutes of Exercise per Session:   Stress:   . Feeling of Stress :   Social Connections:   . Frequency of Communication with Friends and Family:   . Frequency of Social Gatherings with Friends and Family:   . Attends Religious Services:   . Active Member of Clubs or Organizations:   . Attends Archivist Meetings:   Marland Kitchen Marital Status:   Intimate Partner Violence:   . Fear of Current or Ex-Partner:   . Emotionally Abused:   Marland Kitchen Physically Abused:   . Sexually Abused:     Review of Systems: Pertinent positive and negative  review of systems were noted in the above HPI section.  All other review of systems was otherwise negative.  Physical Exam: Vital signs in last 24 hours: Temp:  [98 F (36.7 C)] 98 F (36.7 C) (04/09 1204) Pulse Rate:  [59-83] 83 (04/09 1600) Resp:  [18-19] 19 (04/09 1530) BP: (146-174)/(51-73) 148/70 (04/09 1600) SpO2:  [97 %-99 %] 98 % (04/09 1600) Weight:  [90.7 kg] 90.7 kg (04/09 1206)   General:   Alert,  Well-developed, well-nourished, elderly white female, uncomfortable appearing, and cooperative in NAD Head:  Normocephalic and atraumatic. Eyes:  Sclera clear, no icterus.   Conjunctiva pale Ears:  Normal auditory acuity. Nose:  No deformity, discharge,  or lesions. Mouth:  No deformity or lesions.  Neck:  Supple; no masses or thyromegaly. Lungs:  Clear throughout to auscultation.   No wheezes, crackles, or rhonchi. Heart:  Regular rate and rhythm; no murmurs, clicks, rubs,  or gallops. Abdomen: Obese, soft, she has some tenderness across the lower abdomen, no palpable hepatosplenomegaly, bowel sounds present Rectal: Not done, documented heme positive with dark stool per ER MD Msk:  Symmetrical without gross deformities. . Pulses:  Normal pulses noted. Extremities: 2+ edema bilateral lower extremities to the knees Neurologic:  Alert and  oriented x4;  grossly normal neurologically, able to answer simple questions, but frequently says "I do not know' Skin:  Intact without significant lesions or rashes.. Psych:  Alert and cooperative. Normal mood and affect.  Intake/Output from previous day: No intake/output data recorded. Intake/Output this shift: Total I/O In: -  Out: 650 [Urine:650]  Lab Results: Recent Labs    11/06/19 1024  WBC 2.6*  HGB 6.8*  HCT 22.6*  PLT 77*   BMET Recent Labs    11/06/19 1024  NA 136  K 4.6  CL 106  CO2 25  GLUCOSE 114*  BUN 14  CREATININE 0.83  CALCIUM 8.6*   LFT Recent Labs    11/06/19 1024  PROT 5.6*  ALBUMIN 3.0*    AST 21  ALT 8  ALKPHOS 115  BILITOT 0.4   PT/INR Recent Labs    11/06/19 1337  LABPROT 17.0*  INR 1.4*   Hepatitis Panel No results for input(s): HEPBSAG, HCVAB, HEPAIGM, HEPBIGM in the last 72 hours.   IMPRESSION:  #37 86 year old white female with metastatic neuroendocrine tumor of the lung metastatic to liver on chemotherapy, admitted with progressive anemia with drop in hemoglobin from 10.5-6.8 over the past month.  Patient noting dark stools over the past several weeks, and documented heme positive She is anticoagulated on Xarelto  Etiology of blood loss is not clear, suspect slow bleed over multiple weeks.  She has prior history of esophagitis, rule out ulcerative esophagitis, peptic ulcer disease, possible lower GI source, consider hemobilia  #2 pancytopenia-question chemo related #3 prior history of CVA/TIA with cerebellar degeneration and cognitive impairment #4 seizure disorder 5.  History of atrial fibrillation 6.  History of pulmonary embolus-on Xarelto 7.  Status post pacemaker #8 chronic debilitation, nonambulatory  Plan; hold Xarelto Transfuse to keep hemoglobin above 8 IV PPI twice daily Clear liquid diet tonight, n.p.o. after midnight Probable upper endoscopy tomorrow afternoon per Dr. Havery Moros, she is otherwise stable.  Will reassess in a.m.  Placed order for Foley as she is very uncomfortable and requesting. Thank you will follow with you     Kateena Degroote EsterwoodPA-C  11/06/2019, 4:30 PM

## 2019-11-06 NOTE — ED Provider Notes (Signed)
[facility] DEPT Provider Note   CSN: [id number] Arrival date & time: 11/06/19  1154     History Chief Complaint  Patient presents with  . GI Bleeding  . Low Hgb    [name] is a 86 y.o. female.  Patient is a 86 year old female who has a history of dementia, lung cancer with metastatic disease, paroxysmal atrial fibrillation on Xarelto and hyperlipidemia who presents with possible GI bleed.  She had some labs done today at the cancer center.  They noted a significant drop in her hemoglobin.  She has been reporting black tarry stools for about the last week.  They sent her here for further evaluation.  She feels a little weak but overall denies any chest pain or shortness of breath.  No dizziness.  No abdominal pain.  She is currently undergoing chemotherapy for her cancer and has received radiation treatment in the past.        Past Medical History:  Diagnosis Date  . Acute respiratory failure with hypoxia ([place]) 10/23/2015  . Allergic rhinitis    PT. DENIES  . Anxiety   . Aortic insufficiency    Echo 04/29/2018: EF 65-70, mild AS (mean 13), mod AI, Asc Aorta 42 mm (mildly dilated), mild LAE, PASP 41, pericardium normal in appearance.   . Arthritis    NECK  . Ataxia   . Back pain 12/13/2016  . Bradycardia    primarily nocturnal  . Burning tongue syndrome 25 years  . Cancer ([place]) dx'd 06/2016   liver  . Cataract   . Cerebellar degeneration ([place])   . Chest pain 12/13/2016   Atypical chest pain  . Chronic urinary tract infection   . Complication of anesthesia    low o2 sats, coded 30 years ago  . CVA (cerebral infarction) 05/2003  . Dehydration with hyponatremia 03/23/2018  . Depression   . Encounter for antineoplastic chemotherapy 10/09/2016  . Fatigue 01/03/2015  . Gait disorder   . Gastric polyps   . GERD (gastroesophageal reflux disease)   . Goals of care, counseling/discussion 10/09/2016  . H/O: CVA (cerebrovascular accident)  07/14/2016  . High cholesterol   . History of pericarditis   . Hyperlipidemia   . Hypotension   . Hypothyroidism   . IBS (irritable bowel syndrome)   . Neuroendocrine cancer ([place]) 08/23/2016  . Obesity   . Paroxysmal atrial fibrillation ([name])    chads2vasc score is 6,  she is felt to be a poor candidate for anticoagulation  . Pericarditis   . Personal history of arterial venous malformation (AVM)    right side of face  . Seizure disorder ([place])   . Seizures ([place]) 2003   " smelling"- Gabapentin "no problem"  . Shortness of breath dyspnea    with exertion  . Sick sinus syndrome ([place])   . Sternum fx 10/27/2013  . Stroke [facility]) 5 years ago   Right side of face weak, slurred speach-   . Thyroid disease   . TIA (transient ischemic attack)   . UTI (lower urinary tract infection) 03/27/2016   "frequently"    Patient Active Problem List   Diagnosis Date Noted  . GI bleed 11/06/2019  . Port-A-Cath in place 08/28/2019  . Encounter for antineoplastic chemotherapy 07/07/2019  . Skin breakdown 06/04/2018  . Candida infection 06/04/2018  . Sick sinus syndrome ([place]) 05/22/2018  . Mild aortic stenosis 05/21/2018  . Primary malignant neoplasm of lung metastatic to other site [facility])   .  Macrocytic anemia   . Thrombocytopenia ([place]) 03/23/2018  . Osteoarthritis of knee 01/10/2018  . Pulmonary embolism ([place]) 04/24/2017  . Neuroendocrine carcinoma metastatic to liver ([place]) 12/13/2016  . Liver metastases ([place]) 08/23/2016  . Late effect of cerebrovascular accident (CVA) 08/02/2016  . Meningioma ([place]) 07/14/2016  . Asthmatic bronchitis 10/23/2015  . GAD (generalized anxiety disorder) 09/30/2015  . Peripheral edema 09/30/2015  . Insomnia 09/30/2015  . Localization-related partial epilepsy with simple partial seizures ([place]) 06/27/2015  . Sinus bradycardia 01/03/2015  . Paroxysmal atrial fibrillation ([place]) 01/03/2015  . Morbid obesity ([place]) 01/03/2015  . Solitary pulmonary nodule 06/02/2014  .  Thyroid nodule 05/08/2014  . Depression   . Seizure disorder ([place])   . Hypothyroidism   . Cerebellar degeneration ([place])   . Irritable bowel syndrome 02/18/2008  . Hyperlipemia 09/10/2006  . Essential hypertension 09/10/2006  . GERD (gastroesophageal reflux disease) 09/10/2006    Past Surgical History:  Procedure Laterality Date  . APPENDECTOMY  86 years old  . COLONOSCOPY  2006, 2009  . COLONOSCOPY WITH PROPOFOL N/A 03/28/2016   Procedure: COLONOSCOPY WITH PROPOFOL;  Surgeon: Gatha Mayer, MD;  Location: [facility];  Service: Endoscopy;  Laterality: N/A;  . CORONARY ANGIOGRAPHY N/A 05/22/2018   Procedure: CORONARY ANGIOGRAPHY (CATH LAB);  Surgeon: Lorretta Harp, MD;  Location: [facility] CV LAB;  Service: Cardiovascular;  Laterality: N/A;  . cyst removed  35 years ago  . EP IMPLANTABLE DEVICE N/A 01/06/2015   Procedure: Loop Recorder Insertion;  Surgeon: Thompson Grayer, MD;  Location: [facility] CV LAB;  Service: Cardiovascular;  Laterality: N/A;  . EYE SURGERY Right    Cataract  . IR ANGIOGRAM SELECTIVE EACH ADDITIONAL VESSEL  11/28/2016  . IR ANGIOGRAM SELECTIVE EACH ADDITIONAL VESSEL  11/28/2016  . IR ANGIOGRAM SELECTIVE EACH ADDITIONAL VESSEL  11/28/2016  . IR ANGIOGRAM SELECTIVE EACH ADDITIONAL VESSEL  11/28/2016  . IR ANGIOGRAM SELECTIVE EACH ADDITIONAL VESSEL  12/13/2016  . IR ANGIOGRAM SELECTIVE EACH ADDITIONAL VESSEL  12/13/2016  . IR ANGIOGRAM SELECTIVE EACH ADDITIONAL VESSEL  01/09/2017  . IR ANGIOGRAM VISCERAL SELECTIVE  11/28/2016  . IR ANGIOGRAM VISCERAL SELECTIVE  11/28/2016  . IR ANGIOGRAM VISCERAL SELECTIVE  12/13/2016  . IR ANGIOGRAM VISCERAL SELECTIVE  12/13/2016  . IR ANGIOGRAM VISCERAL SELECTIVE  01/09/2017  . IR EMBO ARTERIAL NOT HEMORR HEMANG INC GUIDE ROADMAPPING  11/28/2016  . IR EMBO TUMOR ORGAN ISCHEMIA INFARCT INC GUIDE ROADMAPPING  12/13/2016  . IR EMBO TUMOR ORGAN ISCHEMIA INFARCT INC GUIDE ROADMAPPING  01/09/2017  . IR IVC FILTER PLMT / S&I /IMG GUID/MOD SED   04/24/2017  . IR RADIOLOGIST EVAL & MGMT  11/06/2016  . IR RADIOLOGIST EVAL & MGMT  01/02/2017  . IR RADIOLOGIST EVAL & MGMT  02/05/2017  . IR RADIOLOGIST EVAL & MGMT  05/02/2017  . IR RADIOLOGIST EVAL & MGMT  10/17/2017  . IR US GUIDE VASC ACCESS RIGHT  11/28/2016  . IR US GUIDE VASC ACCESS RIGHT  12/13/2016  . IR US GUIDE VASC ACCESS RIGHT  01/09/2017  . KNEE ARTHROSCOPY Right 11/14/2006  . KNEE ARTHROSCOPY Bilateral 5 and 6 years ago  . KNEE ARTHROSCOPY WITH LATERAL MENISECTOMY  07/03/2012   Procedure: KNEE ARTHROSCOPY WITH LATERAL MENISECTOMY;  Surgeon: Magnus Sinning, MD;  Location: [facility];  Service: Orthopedics;  Laterality: Left;  with Partial Lateral Menisectomy and Medial Menisectomy. Shaving of medial and lateral femoral condyles. Shaving of patella. Removal of a loose body  . LOOP RECORDER REMOVAL N/A 05/22/2018  Procedure: LOOP RECORDER REMOVAL;  Surgeon: Thompson Grayer, MD;  Location: [facility] CV LAB;  Service: Cardiovascular;  Laterality: N/A;  . PACEMAKER IMPLANT N/A 05/22/2018   MDT Azure XT DR MRI implanted by Dr Rayann Heman for sick sinus syndrome  . tibial and fibular internal fixation Left   . TOTAL ABDOMINAL HYSTERECTOMY  86 years old  . UPPER GASTROINTESTINAL ENDOSCOPY  2009, 2013     OB History   No obstetric history on file.     Family History  Problem Relation Age of Onset  . Heart attack Father 20       fatal  . Coronary artery disease Brother   . Diabetes Brother   . Prostate cancer Brother   . Prostate cancer Son     Social History   Tobacco Use  . Smoking status: Former Smoker    Packs/day: 0.25    Years: 10.00    Pack years: 2.50    Types: Cigarettes    Quit date: 07/31/1975    Years since quitting: 44.2  . Smokeless tobacco: Never Used  Substance Use Topics  . Alcohol use: No    Comment: Rare- maybe a drink ever 2 years  . Drug use: No    Home Medications Prior to Admission medications   Medication Sig Start Date End Date Taking? Authorizing  Provider  amLODipine (NORVASC) 2.5 MG tablet TAKE 1 TABLET DAILY Patient taking differently: Take 2.5 mg by mouth daily.  09/22/19  Yes Stacks, Cletus Gash, MD  capecitabine (XELODA) 500 MG tablet TAKE 3 TABLETS (1500MG ) BY MOUTH TWICE DAILY AFTER A MEAL. TAKE ON DAYS 1-14 OF EACH 28 DAY CYCLE 09/10/19  Yes Heilingoetter, Cassandra L, PA-C  escitalopram (LEXAPRO) 20 MG tablet TAKE 1 TABLET AT BEDTIME Patient taking differently: Take 20 mg by mouth at bedtime.  04/08/19  Yes [name], MD  esomeprazole (NEXIUM) 40 MG capsule Take 1 capsule (40 mg total) by mouth 2 (two) times daily before a meal. 06/17/19  Yes Stacks, Cletus Gash, MD  fluticasone (FLONASE) 50 MCG/ACT nasal spray Place 2 sprays into both nostrils at bedtime. 04/14/19  Yes Stacks, Cletus Gash, MD  furosemide (LASIX) 20 MG tablet TAKE 1 TABLET DAILY AS NEEDED FOR FLUID RETENTION Patient taking differently: Take 20 mg by mouth daily as needed for fluid.  10/13/19  Yes Stacks, Cletus Gash, MD  gabapentin (NEURONTIN) 400 MG capsule TAKE 1 TO 2 CAPSULES THREE TIMES A DAY Patient taking differently: Take 400-800 mg by mouth 3 (three) times daily.  04/14/19  Yes Stacks, Cletus Gash, MD  hydroxypropyl methylcellulose / hypromellose (ISOPTO TEARS / GONIOVISC) 2.5 % ophthalmic solution Place 1 drop into both eyes daily.    Yes [provider]  levothyroxine (SYNTHROID) 75 MCG tablet Take 1 tablet (75 mcg total) by mouth daily before breakfast. 04/14/19  Yes Stacks, Cletus Gash, MD  lovastatin (MEVACOR) 40 MG tablet TAKE 1 TABLET AT BEDTIME Patient taking differently: Take 40 mg by mouth at bedtime.  10/13/19  Yes Stacks, Cletus Gash, MD  Melatonin 3 MG TBDP Take 3-6 mg by mouth at bedtime as needed. Patient taking differently: Take 3-6 mg by mouth at bedtime as needed (sleep).  08/02/16  Yes Eustaquio Maize, MD  ondansetron (ZOFRAN) 8 MG tablet Take 1 tablet (8 mg total) by mouth every 8 (eight) hours as needed for nausea or vomiting. 04/14/19  Yes [name], MD    OXcarbazepine (TRILEPTAL) 150 MG tablet Take 1 tablet (150 mg total) by mouth 2 (two) times daily. 04/14/19  Yes [name], MD  QUEtiapine (SEROQUEL) 25 MG tablet Take 1 tablet (25 mg total) by mouth at bedtime. 10/26/19  Yes Tomi Likens, Adam R, DO  rivaroxaban (XARELTO) 20 MG TABS tablet TAKE 1 TABLET DAILY WITH SUPPER Patient taking differently: Take 20 mg by mouth daily with supper.  04/14/19  Yes Stacks, Cletus Gash, MD  silver sulfADIAZINE (SILVADENE) 1 % cream Apply 1 application topically daily. 06/17/19  Yes Stacks, Cletus Gash, MD  temozolomide (TEMODAR) 140 MG capsule TAKE 2 CAPSULES BY MOUTH DAILY FOR 5 DAYS,ON DAYS 10-14 OF EACH 28DAY CYCLE. MAY TAKE ON EMPTY STOMACH AT BEDTIME TO DECREASE NAUSEA & VOMITING 09/10/19  Yes Heilingoetter, Cassandra L, PA-C  amitriptyline (ELAVIL) 25 MG tablet TAKE 1 TABLET AT BEDTIME Patient not taking: Reported on 10/06/2019 04/08/19   [name], MD    Allergies    Levaquin [levofloxacin] and Lisinopril  Review of Systems   Review of Systems  Constitutional: Positive for fatigue. Negative for chills, diaphoresis and fever.  HENT: Negative for congestion, rhinorrhea and sneezing.   Eyes: Negative.   Respiratory: Negative for cough, chest tightness and shortness of breath.   Cardiovascular: Negative for chest pain and leg swelling.  Gastrointestinal: Positive for blood in stool. Negative for abdominal pain, diarrhea, nausea and vomiting.  Genitourinary: Negative for difficulty urinating, flank pain, frequency and hematuria.  Musculoskeletal: Negative for arthralgias and back pain.  Skin: Negative for rash.  Neurological: Negative for dizziness, speech difficulty, weakness, numbness and headaches.    Physical Exam Updated Vital Signs BP (!) 146/73   Pulse 82   Temp 98 F (36.7 C) (Oral)   Resp 19   Ht 5\' 4"  (1.626 m)   Wt 90.7 kg   SpO2 98%   BMI 34.33 kg/m   Physical Exam Constitutional:      Appearance: She is well-developed.  HENT:      Head: Normocephalic and atraumatic.  Eyes:     Pupils: Pupils are equal, round, and reactive to light.  Cardiovascular:     Rate and Rhythm: Normal rate and regular rhythm.     Heart sounds: Normal heart sounds.  Pulmonary:     Effort: Pulmonary effort is normal. No respiratory distress.     Breath sounds: Normal breath sounds. No wheezing or rales.  Chest:     Chest wall: No tenderness.  Abdominal:     General: Bowel sounds are normal.     Palpations: Abdomen is soft.     Tenderness: There is no abdominal tenderness. There is no guarding or rebound.  Musculoskeletal:        General: Normal range of motion.     Cervical back: Normal range of motion and neck supple.  Lymphadenopathy:     Cervical: No cervical adenopathy.  Skin:    General: Skin is warm and dry.     Findings: No rash.  Neurological:     General: No focal deficit present.     Mental Status: She is alert and oriented to person, place, and time.     ED Results / Procedures / Treatments   Labs (all labs ordered are listed, but only abnormal results are displayed) Labs Reviewed  PROTIME-INR - Abnormal; Notable for the following components:      Result Value   Prothrombin Time 17.0 (*)    INR 1.4 (*)    All other components within normal limits  POC OCCULT BLOOD, ED - Abnormal; Notable for the following components:   Fecal Occult Bld POSITIVE (*)  All other components within normal limits  SARS CORONAVIRUS 2 (TAT 6-24 HRS)  TYPE AND SCREEN  PREPARE RBC (CROSSMATCH)  ABO/RH    EKG None  Radiology CT Chest W Contrast  Result Date: 11/06/2019 CLINICAL DATA:  Gastrointestinal cancer staging. EXAM: CT CHEST, ABDOMEN, AND PELVIS WITH CONTRAST TECHNIQUE: Multidetector CT imaging of the chest, abdomen and pelvis was performed following the standard protocol during bolus administration of intravenous contrast. CONTRAST:  139mL OMNIPAQUE IOHEXOL 300 MG/ML  SOLN COMPARISON:  07/06/2019. FINDINGS: CT CHEST FINDINGS  Cardiovascular: Atherosclerotic calcification of the aorta, aortic valve and coronary arteries. Heart is enlarged. No pericardial effusion. Mediastinum/Nodes: No pathologically enlarged mediastinal or hilar lymph nodes. Left axillary lymph nodes measure up to 10 mm (2/20), increased from 6 mm. No right axillary adenopathy. Esophagus is unremarkable. Lungs/Pleura: Image quality is somewhat degraded by respiratory motion. There are numerous hematogenously distributed tiny bilateral pulmonary nodules, measuring 5 mm or less in size, as before. Focal volume loss along the anterior margin of the minor fissure (6/49), stable. No pleural fluid. Airway is unremarkable. Musculoskeletal: Degenerative changes in the spine. No worrisome lytic or sclerotic lesions. Old sternal fracture. CT ABDOMEN PELVIS FINDINGS Hepatobiliary: A vague area mildly decreased attenuation in the lateral dome of the liver (2/39) is unchanged, as is a 2.1 cm low-attenuation lesion left hepatic lobe (2/44). Largest heterogeneous lesion is seen in segment 3 of the liver, measuring 3.2 x 3.3 cm (2/52), also stable. There may be additional smaller hypoattenuating lesions in the right hepatic lobe similar to prior exam. Stones are seen in a contracted gallbladder. Mild intrahepatic biliary ductal dilatation, unchanged. Pancreas: Negative. Spleen: Negative. Adrenals/Urinary Tract: Adrenal glands are unremarkable. Subcentimeter low-attenuation lesion in the right kidney is too small to characterize. Ureters are decompressed. There may be minimal bladder wall thickening. Stomach/Bowel: Stomach, small bowel and colon are unremarkable. Appendix is not well-visualized. Vascular/Lymphatic: Atherosclerotic calcification of the aorta without aneurysm. Filter is seen in the inferior vena cava. No pathologically enlarged lymph nodes. Reproductive: Hysterectomy.  No adnexal mass. Other: No free fluid. Midline laxity ventral abdominal wall at the umbilicus.  Mesenteries and peritoneum are otherwise unremarkable. Musculoskeletal: No worrisome lytic or sclerotic lesions S shaped scoliosis. IMPRESSION: 1. Pulmonary and hepatic metastatic disease appears stable from 07/06/2019. 2. Minimally enlarged left axillary lymph nodes are nonspecific. Question recent COVID-19 vaccination. Please correlate clinically. 3. No additional evidence of metastatic disease. 4. Cholelithiasis. 5. Aortic atherosclerosis (ICD10-I70.0). Coronary artery calcification. Electronically Signed   By: Lorin Picket M.D.   On: 11/06/2019 15:43   CT Abdomen Pelvis W Contrast  Result Date: 11/06/2019 CLINICAL DATA:  Gastrointestinal cancer staging. EXAM: CT CHEST, ABDOMEN, AND PELVIS WITH CONTRAST TECHNIQUE: Multidetector CT imaging of the chest, abdomen and pelvis was performed following the standard protocol during bolus administration of intravenous contrast. CONTRAST:  19mL OMNIPAQUE IOHEXOL 300 MG/ML  SOLN COMPARISON:  07/06/2019. FINDINGS: CT CHEST FINDINGS Cardiovascular: Atherosclerotic calcification of the aorta, aortic valve and coronary arteries. Heart is enlarged. No pericardial effusion. Mediastinum/Nodes: No pathologically enlarged mediastinal or hilar lymph nodes. Left axillary lymph nodes measure up to 10 mm (2/20), increased from 6 mm. No right axillary adenopathy. Esophagus is unremarkable. Lungs/Pleura: Image quality is somewhat degraded by respiratory motion. There are numerous hematogenously distributed tiny bilateral pulmonary nodules, measuring 5 mm or less in size, as before. Focal volume loss along the anterior margin of the minor fissure (6/49), stable. No pleural fluid. Airway is unremarkable. Musculoskeletal: Degenerative changes in the spine. No worrisome  lytic or sclerotic lesions. Old sternal fracture. CT ABDOMEN PELVIS FINDINGS Hepatobiliary: A vague area mildly decreased attenuation in the lateral dome of the liver (2/39) is unchanged, as is a 2.1 cm low-attenuation  lesion left hepatic lobe (2/44). Largest heterogeneous lesion is seen in segment 3 of the liver, measuring 3.2 x 3.3 cm (2/52), also stable. There may be additional smaller hypoattenuating lesions in the right hepatic lobe similar to prior exam. Stones are seen in a contracted gallbladder. Mild intrahepatic biliary ductal dilatation, unchanged. Pancreas: Negative. Spleen: Negative. Adrenals/Urinary Tract: Adrenal glands are unremarkable. Subcentimeter low-attenuation lesion in the right kidney is too small to characterize. Ureters are decompressed. There may be minimal bladder wall thickening. Stomach/Bowel: Stomach, small bowel and colon are unremarkable. Appendix is not well-visualized. Vascular/Lymphatic: Atherosclerotic calcification of the aorta without aneurysm. Filter is seen in the inferior vena cava. No pathologically enlarged lymph nodes. Reproductive: Hysterectomy.  No adnexal mass. Other: No free fluid. Midline laxity ventral abdominal wall at the umbilicus. Mesenteries and peritoneum are otherwise unremarkable. Musculoskeletal: No worrisome lytic or sclerotic lesions S shaped scoliosis. IMPRESSION: 1. Pulmonary and hepatic metastatic disease appears stable from 07/06/2019. 2. Minimally enlarged left axillary lymph nodes are nonspecific. Question recent COVID-19 vaccination. Please correlate clinically. 3. No additional evidence of metastatic disease. 4. Cholelithiasis. 5. Aortic atherosclerosis (ICD10-I70.0). Coronary artery calcification. Electronically Signed   By: Lorin Picket M.D.   On: 11/06/2019 15:43    Procedures Procedures (including critical care time)  Medications Ordered in ED Medications  0.9 %  sodium chloride infusion (Manually program via Guardrails IV Fluids) (has no administration in time range)  pantoprazole (PROTONIX) injection 40 mg (has no administration in time range)  cefTRIAXone (ROCEPHIN) 2 g in sodium chloride 0.9 % 100 mL IVPB (has no administration in time  range)    ED Course  I have reviewed the triage vital signs and the nursing notes.  Pertinent labs & imaging results that were available during my care of the patient were reviewed by me and considered in my medical decision making (see chart for details).    MDM Rules/Calculators/A&P                      Patient is a 86 year old female on Xarelto who presents with GI bleeding.  Rectal exam shows melena but no gross blood.  It was Hemoccult positive.  This was done by me.  She has no abdominal pain on exam.  Her hemoglobin has dropped significantly from her baseline.  Her husband and the patient are not consenting to blood.  I have spoke with Dr. Andria Frames with the hospitalist service to admit the patient.  I also consulted with Amy with [facility] and they will see the patient as well.  CRITICAL CARE Performed by: Malvin Johns Total critical care time: 60 minutes Critical care time was exclusive of separately billable procedures and treating other patients. Critical care was necessary to treat or prevent imminent or life-threatening deterioration. Critical care was time spent personally by me on the following activities: development of treatment plan with patient and/or surrogate as well as nursing, discussions with consultants, evaluation of patient's response to treatment, examination of patient, obtaining history from patient or surrogate, ordering and performing treatments and interventions, ordering and review of laboratory studies, ordering and review of radiographic studies, pulse oximetry and re-evaluation of patient's condition.  Final Clinical Impression(s) / ED Diagnoses Final diagnoses:  Acute GI bleeding    Rx / DC Orders  ED Discharge Orders    None       Malvin Johns, MD 11/06/19 1558

## 2019-11-06 NOTE — ED Notes (Addendum)
Patient's husband came out and said that patient was unable to pee and is in a lot of pain. Patient requesting catheter placement. Lenor Derrick, MD notified.

## 2019-11-06 NOTE — ED Notes (Addendum)
Chrys Racer, RN attempted to call report at this time but floor RN was busy. Secretary said floor RN will call ED when ready.

## 2019-11-06 NOTE — ED Notes (Signed)
Patient completely non ambulatory. At least 2 person assist

## 2019-11-06 NOTE — ED Notes (Signed)
Called x 1 no answer

## 2019-11-06 NOTE — ED Notes (Signed)
Patient placed on pure wic

## 2019-11-06 NOTE — ED Notes (Signed)
Patient suddenly became agitated and said "I don't want to be admitted. I want to get blood and go home". RN tried to explain to patient that you have to get a blood transfusion in the hospital. Patient then said "But I have been waiting for hours I want the blood now". RN tried to explain that patient's blood has to be cross matched in order to get the correct blood for transfusion. Patient unable to be consoled. Tamera Punt, MD notified and is at bedside. Patient's husband is coming back.

## 2019-11-06 NOTE — Telephone Encounter (Signed)
Contacted husband and told him to take pt to ED for evaluation of low hgb. She has been bleeding for a while -her stools are black". CT staff notified. ED charge nurse notified that pt will be coming from CT scan.

## 2019-11-07 ENCOUNTER — Inpatient Hospital Stay (HOSPITAL_COMMUNITY): Payer: Medicare Other | Admitting: Anesthesiology

## 2019-11-07 ENCOUNTER — Encounter (HOSPITAL_COMMUNITY): Payer: Self-pay | Admitting: Internal Medicine

## 2019-11-07 ENCOUNTER — Encounter (HOSPITAL_COMMUNITY)
Admission: EM | Disposition: A | Discharge status: Home / Self Care | Payer: Self-pay | Source: Home / Self Care | Attending: Internal Medicine

## 2019-11-07 DIAGNOSIS — F329 Major depressive disorder, single episode, unspecified: Secondary | ICD-10-CM

## 2019-11-07 DIAGNOSIS — E782 Mixed hyperlipidemia: Secondary | ICD-10-CM

## 2019-11-07 DIAGNOSIS — C7B8 Other secondary neuroendocrine tumors: Secondary | ICD-10-CM

## 2019-11-07 DIAGNOSIS — K31811 Angiodysplasia of stomach and duodenum with bleeding: Principal | ICD-10-CM

## 2019-11-07 DIAGNOSIS — D62 Acute posthemorrhagic anemia: Secondary | ICD-10-CM

## 2019-11-07 DIAGNOSIS — K317 Polyp of stomach and duodenum: Secondary | ICD-10-CM

## 2019-11-07 DIAGNOSIS — D638 Anemia in other chronic diseases classified elsewhere: Secondary | ICD-10-CM

## 2019-11-07 DIAGNOSIS — G319 Degenerative disease of nervous system, unspecified: Secondary | ICD-10-CM

## 2019-11-07 HISTORY — PX: POLYPECTOMY: SHX5525

## 2019-11-07 HISTORY — PX: HEMOSTASIS CLIP PLACEMENT: SHX6857

## 2019-11-07 HISTORY — PX: HEMOSTASIS CONTROL: SHX6838

## 2019-11-07 HISTORY — PX: ESOPHAGOGASTRODUODENOSCOPY (EGD) WITH PROPOFOL: SHX5813

## 2019-11-07 LAB — CBC
HCT: 21.8 % — ABNORMAL LOW (ref 36.0–46.0)
Hemoglobin: 6.4 g/dL — CL (ref 12.0–15.0)
MCH: 32.3 pg (ref 26.0–34.0)
MCHC: 29.4 g/dL — ABNORMAL LOW (ref 30.0–36.0)
MCV: 110.1 fL — ABNORMAL HIGH (ref 80.0–100.0)
Platelets: 76 10*3/uL — ABNORMAL LOW (ref 150–400)
RBC: 1.98 MIL/uL — ABNORMAL LOW (ref 3.87–5.11)
RDW: 20.3 % — ABNORMAL HIGH (ref 11.5–15.5)
WBC: 2.5 10*3/uL — ABNORMAL LOW (ref 4.0–10.5)
nRBC: 0.8 % — ABNORMAL HIGH (ref 0.0–0.2)

## 2019-11-07 LAB — BASIC METABOLIC PANEL
Anion gap: 9 (ref 5–15)
BUN: 11 mg/dL (ref 8–23)
CO2: 22 mmol/L (ref 22–32)
Calcium: 8.6 mg/dL — ABNORMAL LOW (ref 8.9–10.3)
Chloride: 106 mmol/L (ref 98–111)
Creatinine, Ser: 0.79 mg/dL (ref 0.44–1.00)
GFR calc Af Amer: >60 mL/min (ref >60)
GFR calc non Af Amer: >60 mL/min (ref >60)
Glucose, Bld: 115 mg/dL — ABNORMAL HIGH (ref 70–99)
Potassium: 4.5 mmol/L (ref 3.5–5.1)
Sodium: 137 mmol/L (ref 135–145)

## 2019-11-07 LAB — PREPARE RBC (CROSSMATCH)

## 2019-11-07 LAB — HEMOGLOBIN AND HEMATOCRIT, BLOOD
HCT: 26.7 % — ABNORMAL LOW (ref 36.0–46.0)
HCT: 29.7 % — ABNORMAL LOW (ref 36.0–46.0)
Hemoglobin: 8.6 g/dL — ABNORMAL LOW (ref 12.0–15.0)
Hemoglobin: 9.9 g/dL — ABNORMAL LOW (ref 12.0–15.0)

## 2019-11-07 LAB — PROTIME-INR
INR: 1.4 — ABNORMAL HIGH (ref 0.8–1.2)
Prothrombin Time: 17.3 seconds — ABNORMAL HIGH (ref 11.4–15.2)

## 2019-11-07 SURGERY — ESOPHAGOGASTRODUODENOSCOPY (EGD) WITH PROPOFOL
Anesthesia: Monitor Anesthesia Care

## 2019-11-07 MED ORDER — HYDRALAZINE HCL 20 MG/ML IJ SOLN
INTRAMUSCULAR | Status: DC | PRN
  Administered 2019-11-07 (×2): 5 mg via INTRAVENOUS

## 2019-11-07 MED ORDER — ESMOLOL HCL 100 MG/10ML IV SOLN
INTRAVENOUS | Status: DC | PRN
  Administered 2019-11-07: 20 mg via INTRAVENOUS

## 2019-11-07 MED ORDER — EPINEPHRINE 1 MG/10ML IJ SOSY
PREFILLED_SYRINGE | INTRAMUSCULAR | Status: AC
  Filled 2019-11-07: qty 10

## 2019-11-07 MED ORDER — PROPOFOL 10 MG/ML IV BOLUS
INTRAVENOUS | Status: AC
  Filled 2019-11-07: qty 20

## 2019-11-07 MED ORDER — PROPOFOL 10 MG/ML IV BOLUS
INTRAVENOUS | Status: DC | PRN
  Administered 2019-11-07: 40 mg via INTRAVENOUS
  Administered 2019-11-07: 20 mg via INTRAVENOUS
  Administered 2019-11-07 (×3): 40 mg via INTRAVENOUS
  Administered 2019-11-07 (×2): 20 mg via INTRAVENOUS
  Administered 2019-11-07 (×5): 40 mg via INTRAVENOUS

## 2019-11-07 MED ORDER — MORPHINE SULFATE (PF) 2 MG/ML IV SOLN
1.0000 mg | Freq: Once | INTRAVENOUS | Status: AC
  Administered 2019-11-07: 1 mg via INTRAVENOUS
  Filled 2019-11-07: qty 1

## 2019-11-07 MED ORDER — LIDOCAINE 2% (20 MG/ML) 5 ML SYRINGE
INTRAMUSCULAR | Status: DC | PRN
  Administered 2019-11-07: 60 mg via INTRAVENOUS

## 2019-11-07 MED ORDER — SODIUM CHLORIDE 0.9 % IV SOLN: INTRAVENOUS | Status: DC | PRN

## 2019-11-07 MED ORDER — CHLORHEXIDINE GLUCONATE CLOTH 2 % EX PADS
6.0000 | MEDICATED_PAD | Freq: Every day | CUTANEOUS | Status: DC
  Administered 2019-11-08: 6 via TOPICAL

## 2019-11-07 MED ORDER — SODIUM CHLORIDE (PF) 0.9 % IJ SOLN
PREFILLED_SYRINGE | INTRAMUSCULAR | Status: DC | PRN
  Administered 2019-11-07: 17:00:00 2.5 mL
  Administered 2019-11-07: 5 mL

## 2019-11-07 MED ORDER — SODIUM CHLORIDE 0.9% IV SOLUTION: Freq: Once | INTRAVENOUS | Status: AC

## 2019-11-07 SURGICAL SUPPLY — 15 items

## 2019-11-07 NOTE — Op Note (Signed)
[facility] Patient Name: [name] Procedure Date: 11/07/2019 MRN: [id number] Attending MD: Carlota Raspberry. Havery Moros , MD Date of Birth: May 08, 1934 CSN: [id number] Age: 86 Admit Type: Inpatient Procedure:                Upper GI endoscopy Indications:              Suspected upper gastrointestinal bleeding - melena,                            Xarelto use, thrombocytopenia Providers:                Remo Lipps P. Havery Moros, MD, Benetta Spar RN, RN,                            Josie Dixon, RN, Laverda Sorenson, Technician,                            [facility], CRNA Referring MD:              Medicines:                Monitored Anesthesia Care Complications:            No immediate complications. Estimated blood loss:                            Minimal. Estimated Blood Loss:     Estimated blood loss was minimal. Procedure:                Pre-Anesthesia Assessment:                           - Prior to the procedure, a History and Physical                            was performed, and patient medications and                            allergies were reviewed. The patient's tolerance of                            previous anesthesia was also reviewed. The risks                            and benefits of the procedure and the sedation                            options and risks were discussed with the patient.                            All questions were answered, and informed consent                            was obtained. Prior Anticoagulants: The patient has                            taken  Xarelto (rivaroxaban), last dose was 2 days                            prior to procedure. ASA Grade Assessment: III - A                            patient with severe systemic disease. After                            reviewing the risks and benefits, the patient was                            deemed in satisfactory condition to undergo the   procedure.                           After obtaining informed consent, the endoscope was                            passed under direct vision. Throughout the                            procedure, the patient's blood pressure, pulse, and                            oxygen saturations were monitored continuously. The                            GIF-H190 ([id number]) Olympus gastroscope was                            introduced through the mouth, and advanced to the                            second part of duodenum. The upper GI endoscopy was                            accomplished without difficulty. The patient                            tolerated the procedure well. Scope In: Scope Out: Findings:      Esophagogastric landmarks were identified: the Z-line was found at 40       cm, the gastroesophageal junction was found at 40 cm and the upper       extent of the gastric folds was found at 40 cm from the incisors.      The exam of the esophagus was otherwise normal.      A single 8 to 10 mm sessile polyp with active oozing and stigmata of       recent bleeding was found in the cardia. Area was successfully injected       with 4 mL of a 1:10,000 solution of epinephrine for drug delivery to       lift the polyp prior to polypectomy. The polyp was removed with a hot       snare. Resection and  retrieval were complete. To prevent bleeding after       the polypectomy, one hemostatic clip was successfully placed.      A single 6 mm sessile polyp with active oozing and stigmata of recent       bleeding was found in the gastric fundus. Area was successfully injected       with 3 mL of a 1:10,000 solution of epinephrine for a lift polypectomy.       The polyp was removed with a hot snare. Resection and retrieval were       complete. There was persistent oozingp post polypectom, three hemostatic       clips were successfully placed across the lesion with good hemostasis.      Two 4 mm sessile polyps  with bleeding and stigmata of recent bleeding       were found in the gastric body. The polyps were removed with a hot       snare. Resection and retrieval were complete. To close a defects after       polypectomy, two hemostatic clips were successfully placed.      Five small angiodysplastic lesions vs. flat gastric polyps with stigmata       of recent bleeding / oozing were found in the gastric fundus and in the       gastric body. Fulguration to ablate the lesions by argon plasma was       successful.      Mild gastric antral vascular ectasia was present in the gastric antrum.       No bleeding noted in the antrum, it was not treated.      Multiple additional benign appearing medium sessile polyps were found in       the gastric fundus and in the gastric body.      The exam of the stomach was otherwise normal.      The duodenal bulb and second portion of the duodenum were normal. Impression:               - Esophagogastric landmarks identified.                           - Normal esophagus otherwise                           - 4 gastric polyps with oozing - removed / treated                            as above                           - Five angiodysplastic lesions vs. flat vascular                            polyps in the stomach. Treated with argon plasma                            coagulation (APC).                           - Mild gastric antral vascular ectasia.                           -  Multiple additional benign appearing gastric                            polyps.                           - Normal duodenal bulb and second portion of the                            duodenum.                           Overall, patient has had bleeding from hemorrhagic                            polyps / AVMs in the stomach in the setting of                            anticoagulation use and thrombocytopenia. Moderate Sedation:      No moderate sedation, case performed with MAC Recommendation:            - Return patient to hospital ward for ongoing care.                           - NPO for tonight, clears tomorrow if stable                           - Continue present medications.                           - Await pathology results.                           - Continue to hold Xarelto Procedure Code(s):        --- Professional ---                           76195, 59, Esophagogastroduodenoscopy, flexible,                            transoral; with control of bleeding, any method                           43251, Esophagogastroduodenoscopy, flexible,                            transoral; with removal of tumor(s), polyp(s), or                            other lesion(s) by snare technique                           43236, 59, Esophagogastroduodenoscopy, flexible,                            transoral; with directed submucosal injection(s),  any substance Diagnosis Code(s):        --- Professional ---                           K31.7, Polyp of stomach and duodenum                           K31.811, Angiodysplasia of stomach and duodenum                            with bleeding CPT copyright 2019 [employer]. All rights reserved. The codes documented in this report are preliminary and upon coder review may  be revised to meet current compliance requirements. Remo Lipps P. Araceli Coufal, MD 11/07/2019 5:37:13 PM This report has been signed electronically. Number of Addenda: 0

## 2019-11-07 NOTE — Progress Notes (Signed)
Patient ID: [name], female   DOB: September 03, 1933, 86 y.o.   MRN: [id number]    Progress Note   Subjective   Day # 1 CC: melena ,progressive anemia  HGB 6.4 this am -was supposed to get transfused yesterday afternoon, somehow that did not happen, transfusing one unit now WBC2.5, PLts 76 INR 1.4  Patient quite uncomfortable again this morning complaining of bladder fullness question spasms.  She did not get Foley placed last evening, she has been putting out urine with the external device but just had bladder scan which shows 400 cc residual.  No bowel movement since admit  Husband at bedside who says her stools have been dark off and on for couple of months, would be dark for several days then clear then recur and over the past week have been consistently dark/black.       Objective   Vital signs in last 24 hours: Temp:  [98 F (36.7 C)-98.6 F (37 C)] 98.4 F (36.9 C) (04/10 0612) Pulse Rate:  [59-95] 59 (04/10 0612) Resp:  [17-20] 18 (04/10 0612) BP: (132-187)/(43-82) 156/49 (04/10 0612) SpO2:  [94 %-99 %] 98 % (04/10 0612) Weight:  [90.7 kg] 90.7 kg (04/09 1206) Last BM Date: 11/06/19 General: Elderly white female in uncomfortable, husband at bedside Heart:  Regular rate and rhythm; no murmurs Lungs: Respirations even and unlabored, lungs CTA bilaterally Abdomen:  Soft, mild tenderness across the lower abdomen/bladder and nondistended. Normal bowel sounds. Extremities: 2+ edema bilateral lower extremities Neurologic:  Alert and oriented,  grossly normal neurologically. Psych:  Cooperative. Normal mood and affect.  Intake/Output from previous day: 04/09 0701 - 04/10 0700 In: 518.3 [I.V.:102; Blood:315; IV Piggyback:101.3] Out: 1600 [Urine:1600] Intake/Output this shift: No intake/output data recorded.  Lab Results: Recent Labs    11/06/19 1024 11/07/19 0409  WBC 2.6* 2.5*  HGB 6.8* 6.4*  HCT 22.6* 21.8*  PLT 77* 76*   BMET Recent Labs     11/06/19 1024 11/07/19 0409  NA 136 137  K 4.6 4.5  CL 106 106  CO2 25 22  GLUCOSE 114* 115*  BUN 14 11  CREATININE 0.83 0.79  CALCIUM 8.6* 8.6*   LFT Recent Labs    11/06/19 1024  PROT 5.6*  ALBUMIN 3.0*  AST 21  ALT 8  ALKPHOS 115  BILITOT 0.4   PT/INR Recent Labs    11/06/19 1337 11/07/19 0409  LABPROT 17.0* 17.3*  INR 1.4* 1.4*    Studies/Results: CT Chest W Contrast  Result Date: 11/06/2019 CLINICAL DATA:  Gastrointestinal cancer staging. EXAM: CT CHEST, ABDOMEN, AND PELVIS WITH CONTRAST TECHNIQUE: Multidetector CT imaging of the chest, abdomen and pelvis was performed following the standard protocol during bolus administration of intravenous contrast. CONTRAST:  141mL OMNIPAQUE IOHEXOL 300 MG/ML  SOLN COMPARISON:  07/06/2019. FINDINGS: CT CHEST FINDINGS Cardiovascular: Atherosclerotic calcification of the aorta, aortic valve and coronary arteries. Heart is enlarged. No pericardial effusion. Mediastinum/Nodes: No pathologically enlarged mediastinal or hilar lymph nodes. Left axillary lymph nodes measure up to 10 mm (2/20), increased from 6 mm. No right axillary adenopathy. Esophagus is unremarkable. Lungs/Pleura: Image quality is somewhat degraded by respiratory motion. There are numerous hematogenously distributed tiny bilateral pulmonary nodules, measuring 5 mm or less in size, as before. Focal volume loss along the anterior margin of the minor fissure (6/49), stable. No pleural fluid. Airway is unremarkable. Musculoskeletal: Degenerative changes in the spine. No worrisome lytic or sclerotic lesions. Old sternal fracture. CT ABDOMEN PELVIS FINDINGS Hepatobiliary: A vague  area mildly decreased attenuation in the lateral dome of the liver (2/39) is unchanged, as is a 2.1 cm low-attenuation lesion left hepatic lobe (2/44). Largest heterogeneous lesion is seen in segment 3 of the liver, measuring 3.2 x 3.3 cm (2/52), also stable. There may be additional smaller hypoattenuating  lesions in the right hepatic lobe similar to prior exam. Stones are seen in a contracted gallbladder. Mild intrahepatic biliary ductal dilatation, unchanged. Pancreas: Negative. Spleen: Negative. Adrenals/Urinary Tract: Adrenal glands are unremarkable. Subcentimeter low-attenuation lesion in the right kidney is too small to characterize. Ureters are decompressed. There may be minimal bladder wall thickening. Stomach/Bowel: Stomach, small bowel and colon are unremarkable. Appendix is not well-visualized. Vascular/Lymphatic: Atherosclerotic calcification of the aorta without aneurysm. Filter is seen in the inferior vena cava. No pathologically enlarged lymph nodes. Reproductive: Hysterectomy.  No adnexal mass. Other: No free fluid. Midline laxity ventral abdominal wall at the umbilicus. Mesenteries and peritoneum are otherwise unremarkable. Musculoskeletal: No worrisome lytic or sclerotic lesions S shaped scoliosis. IMPRESSION: 1. Pulmonary and hepatic metastatic disease appears stable from 07/06/2019. 2. Minimally enlarged left axillary lymph nodes are nonspecific. Question recent COVID-19 vaccination. Please correlate clinically. 3. No additional evidence of metastatic disease. 4. Cholelithiasis. 5. Aortic atherosclerosis (ICD10-I70.0). Coronary artery calcification. Electronically Signed   By: Lorin Picket M.D.   On: 11/06/2019 15:43   CT Abdomen Pelvis W Contrast  Result Date: 11/06/2019 CLINICAL DATA:  Gastrointestinal cancer staging. EXAM: CT CHEST, ABDOMEN, AND PELVIS WITH CONTRAST TECHNIQUE: Multidetector CT imaging of the chest, abdomen and pelvis was performed following the standard protocol during bolus administration of intravenous contrast. CONTRAST:  145mL OMNIPAQUE IOHEXOL 300 MG/ML  SOLN COMPARISON:  07/06/2019. FINDINGS: CT CHEST FINDINGS Cardiovascular: Atherosclerotic calcification of the aorta, aortic valve and coronary arteries. Heart is enlarged. No pericardial effusion. Mediastinum/Nodes:  No pathologically enlarged mediastinal or hilar lymph nodes. Left axillary lymph nodes measure up to 10 mm (2/20), increased from 6 mm. No right axillary adenopathy. Esophagus is unremarkable. Lungs/Pleura: Image quality is somewhat degraded by respiratory motion. There are numerous hematogenously distributed tiny bilateral pulmonary nodules, measuring 5 mm or less in size, as before. Focal volume loss along the anterior margin of the minor fissure (6/49), stable. No pleural fluid. Airway is unremarkable. Musculoskeletal: Degenerative changes in the spine. No worrisome lytic or sclerotic lesions. Old sternal fracture. CT ABDOMEN PELVIS FINDINGS Hepatobiliary: A vague area mildly decreased attenuation in the lateral dome of the liver (2/39) is unchanged, as is a 2.1 cm low-attenuation lesion left hepatic lobe (2/44). Largest heterogeneous lesion is seen in segment 3 of the liver, measuring 3.2 x 3.3 cm (2/52), also stable. There may be additional smaller hypoattenuating lesions in the right hepatic lobe similar to prior exam. Stones are seen in a contracted gallbladder. Mild intrahepatic biliary ductal dilatation, unchanged. Pancreas: Negative. Spleen: Negative. Adrenals/Urinary Tract: Adrenal glands are unremarkable. Subcentimeter low-attenuation lesion in the right kidney is too small to characterize. Ureters are decompressed. There may be minimal bladder wall thickening. Stomach/Bowel: Stomach, small bowel and colon are unremarkable. Appendix is not well-visualized. Vascular/Lymphatic: Atherosclerotic calcification of the aorta without aneurysm. Filter is seen in the inferior vena cava. No pathologically enlarged lymph nodes. Reproductive: Hysterectomy.  No adnexal mass. Other: No free fluid. Midline laxity ventral abdominal wall at the umbilicus. Mesenteries and peritoneum are otherwise unremarkable. Musculoskeletal: No worrisome lytic or sclerotic lesions S shaped scoliosis. IMPRESSION: 1. Pulmonary and hepatic  metastatic disease appears stable from 07/06/2019. 2. Minimally enlarged left axillary lymph nodes  are nonspecific. Question recent COVID-19 vaccination. Please correlate clinically. 3. No additional evidence of metastatic disease. 4. Cholelithiasis. 5. Aortic atherosclerosis (ICD10-I70.0). Coronary artery calcification. Electronically Signed   By: Lorin Picket M.D.   On: 11/06/2019 15:43       Assessment / Plan:    #76 86 year old white female with metastatic neuroendocrine tumor of the lung metastatic to liver, who has been on chemotherapy.  Admitted after outpatient labs showed progressive anemia with hemoglobin of 6.9. He has had a chronic pancytopenia with hemoglobins ranging from 8-10 over the past several months  Now with intermittent dark stools over the past several weeks  No active bleeding since admit, hemoglobin has drifted  She was supposed to have been transfused last night but that did not occur, receiving 1 unit currently  #2 chronic anticoagulation-on Xarelto-none over the past 24 hours plus. History of PE #3 history of atrial fibrillation 4.  Status post pacemaker 5.  Seizure disorder 6.  History of previous CVA/TIA and cerebellar degeneration with cognitive impairment #7 debilitation-nurses said required 4 people to get her transferred last evening #8 bladder pain/urinary retention-defer to hospitalist but would be more comfortable with a Foley  Plan; n.p.o. this morning We will order 1 more unit of packed RBCs Continue to hold Xarelto Continue IV PPI twice daily  Patient scheduled for EGD this afternoon with Dr. Havery Moros, discussed in detail with patient's husband, who will sign permit. Further plans pending findings at EGD   Active Problems:   Upper GI bleed     LOS: 1 day   Weldon Nouri PA-C 11/07/2019, 8:32 AM

## 2019-11-07 NOTE — Progress Notes (Signed)
Pt  C/o "bladder pain" with AM assessment. Pt noted to have 981ml in her purewick canister from night shift. No distention noted but bladder scan did show >451ml in bladder. Dr Cathlean Sauer made aware and order received to place foley cath. 1F foley placed by this writer and O. Lanziolotti with immediate return of clear yellow urine. Pt tolerated well.

## 2019-11-07 NOTE — Progress Notes (Signed)
CRITICAL VALUE ALERT  Critical Value:  Hemoglobin of 6.4 (previously 6.8)  Date & Time Notified:  11/07/19 at 0519  Provider Notified: Yes, Dr. Humphrey Rolls via [place].  Orders Received/Actions taken: N/A, no new orders required at this time. MD made aware of critical value, patient otherwise asymptomatic and RN in process of obtaining 2nd IV site to infuse 1 unit of RBCs ASAP. RN will continue to monitor.  1 unit of RBCs started at 0557. Patient remains asymptomatic. Patient has no suspected reaction, stable v/s, RBCs currently infusing.  RN will continue to monitor.    Thompson Grayer, RN 11/07/2019 6:33 AM

## 2019-11-07 NOTE — Interval H&P Note (Signed)
History and Physical Interval Note:  11/07/2019 4:16 PM  [name]  has presented today for surgery, with the diagnosis of GI bleed/anemia.  The various methods of treatment have been discussed with the patient and family. After consideration of risks, benefits and other options for treatment, the patient has consented to  Procedure(s): ESOPHAGOGASTRODUODENOSCOPY (EGD) WITH PROPOFOL (N/A) as a surgical intervention.  The patient's history has been reviewed, patient examined, no change in status, stable for surgery.  I have reviewed the patient's chart and labs.  Questions were answered to the patient's satisfaction.     [name]

## 2019-11-07 NOTE — Transfer of Care (Signed)
Immediate Anesthesia Transfer of Care Note  Patient: [name]  Procedure(s) Performed: ESOPHAGOGASTRODUODENOSCOPY (EGD) WITH PROPOFOL (N/A ) HEMOSTASIS CONTROL HEMOSTASIS CLIP PLACEMENT  Patient Location: PACU  Anesthesia Type:MAC  Level of Consciousness: awake, alert  and oriented  Airway & Oxygen Therapy: Patient Spontanous Breathing and Patient connected to face mask oxygen  Post-op Assessment: Report given to RN and Post -op Vital signs reviewed and stable  Post vital signs: Reviewed and stable  Last Vitals:  Vitals Value Taken Time  BP    Temp    Pulse    Resp    SpO2      Last Pain:  Vitals:   11/07/19 1608  TempSrc: Axillary  PainSc:       Patients Stated Pain Goal: 0 (78/67/67 2094)  Complications: No apparent anesthesia complications

## 2019-11-07 NOTE — Progress Notes (Signed)
PROGRESS NOTE    [name]  ZOX:[id number] DOB: Apr 16, 1934 DOA: 11/06/2019 PCP: [name], MD    Brief Narrative:  Patient was admitted to the hospital with working diagnosis of acute blood loss anemia due to suspected upper GI bleed.  86 year old female who presented with melena.  She does have significant past medical history of for metastatic neuroendocrine tumor of the lung, history of pulmonary embolism status post IVC filter, paroxysmal atrial relation status post pacemaker, history of CVA, meningioma, seizures, hypertension, dyslipidemia, hypothyroidism, depression, cerebellar degeneration, and cognitive impairment. Patient reported to have melanotic stools for about, intermittently, but more severe and persistent over the last 7 days, with no abdominal pain, nausea or vomiting.  She is wheelchair-bound due to ambulatory dysfunction.  Patient was found to have a hemoglobin of 6.8 as an outpatient and she was referred to the hospital for further evaluation. On her initial physical examination blood pressure 147/51, pulse rate 59, respiratory rate 18, temperature 98, oxygen saturation 99%.  Clear to auscultation bilaterally, heart S1-S2 present rhythm, abdomen was diffusely tender to palpation, no rebound or guarding, positive nonpitting bilateral lower extremity edema.  136, potassium 4.6, chloride 106, bicarb 25, glucose 114, BUN 14, creatinine 0.83, white count 3.6, hemoglobin 6.8, hematocrit 2.6, platelets 77.   She had one unit PRBC transfused with persistent significant anemia, 2nd unit for this am. GI planning for endoscopy today.    Assessment & Plan:   Principal Problem:   Upper GI bleed Active Problems:   Hyperlipemia   Essential hypertension   GERD (gastroesophageal reflux disease)   Cerebellar degeneration ([name])   Depression   Hypothyroidism   Paroxysmal atrial fibrillation ([name])   Morbid obesity ([name])   Meningioma ([name])   Neuroendocrine carcinoma metastatic  to liver [facility])   Sick sinus syndrome ([name])   Acute blood loss anemia   Anemia in other chronic diseases classified elsewhere   1. Acute blood loss anemia, suspected upper GI bleed. After one unit Hgb still low at 6,4, no significant abdominal pain, no hematemesis.   Will transfuse 2nd unit and will check H&H in 4 H. Continue IV pantoprazole and follow with results from endoscopy. For now will continue with octreotide.   2. Paroxysmal atrial fibrillation with sick sinus syndrome. Continue with telemetry monitoring. No anticoagulation due to risk of worsening bleeding.   3. Hypothyroid. Continue with levothyroxine.   4. Hx CVA, cerebellar degeneration, cognitive impairment. Patient is non ambulatory, wheelchair bound. Continue with escitalopram, quetiapine and oxcarbazepine  5. Obesity. Dyslipidemia.  Will need outpatient follow up. Continue with statin therapy.    DVT prophylaxis: scd   Code Status:  full Family Communication: no family at the bedside  Disposition Plan/ discharge barriers: patient from home barrier for dc acute blood loss anemia, requiring prbc transfusion and endoscopic procedures.   Consultants:   GI   Procedures:    endoscopy    Subjective: Patient complains of generalized pain, no nausea or vomiting, no chest pain or dyspnea. Very weak and deconditioned.   Objective: Vitals:   11/07/19 0612 11/07/19 0930 11/07/19 1324 11/07/19 1350  BP: (!) 156/49 (!) 162/69 (!) 150/101 (!) 188/51  Pulse: (!) 59  (!) 57 (!) 59  Resp: 18 18 (!) 22   Temp: 98.4 F (36.9 C) 99.5 F (37.5 C) 98.8 F (37.1 C) 98.7 F (37.1 C)  TempSrc: Oral Oral Oral   SpO2: 98% 99% 97%   Weight:      Height:  Intake/Output Summary (Last 24 hours) at 11/07/2019 1401 Last data filed at 11/07/2019 0915 Gross per 24 hour  Intake 866.26 ml  Output 1600 ml  Net -733.74 ml   Filed Weights   11/06/19 1206  Weight: 90.7 kg    Examination:   General: Not in pain or dyspnea,  deconditioned  Neurology: Awake and alert, non focal  E ENT: positive pallor, no icterus, oral mucosa moist Cardiovascular: No JVD. S1-S2 present, rhythmic, no gallops, rubs, or murmurs. ++ non pitting bitalteral lower extremity edema. Pulmonary: positive breath sounds bilaterally, adequate air movement, no wheezing, rhonchi or rales. Gastrointestinal. Abdomen with no organomegaly, non tender, no rebound or guarding, protuberant.  Skin. No rashes Musculoskeletal: no joint deformities     Data Reviewed: I have personally reviewed following labs and imaging studies  CBC: Recent Labs  Lab 11/06/19 1024 11/07/19 0409  WBC 2.6* 2.5*  NEUTROABS 1.6*  --   HGB 6.8* 6.4*  HCT 22.6* 21.8*  MCV 108.1* 110.1*  PLT 77* 76*   Basic Metabolic Panel: Recent Labs  Lab 11/06/19 1024 11/07/19 0409  NA 136 137  K 4.6 4.5  CL 106 106  CO2 25 22  GLUCOSE 114* 115*  BUN 14 11  CREATININE 0.83 0.79  CALCIUM 8.6* 8.6*   GFR: Estimated Creatinine Clearance: 58.1 mL/min (by C-G formula based on SCr of 0.79 mg/dL). Liver Function Tests: Recent Labs  Lab 11/06/19 1024  AST 21  ALT 8  ALKPHOS 115  BILITOT 0.4  PROT 5.6*  ALBUMIN 3.0*   No results for input(s): LIPASE, AMYLASE in the last 168 hours. No results for input(s): AMMONIA in the last 168 hours. Coagulation Profile: Recent Labs  Lab 11/06/19 1337 11/07/19 0409  INR 1.4* 1.4*   Cardiac Enzymes: No results for input(s): CKTOTAL, CKMB, CKMBINDEX, TROPONINI in the last 168 hours. BNP (last 3 results) No results for input(s): PROBNP in the last 8760 hours. HbA1C: No results for input(s): HGBA1C in the last 72 hours. CBG: No results for input(s): GLUCAP in the last 168 hours. Lipid Profile: No results for input(s): CHOL, HDL, LDLCALC, TRIG, CHOLHDL, LDLDIRECT in the last 72 hours. Thyroid Function Tests: No results for input(s): TSH, T4TOTAL, FREET4, T3FREE, THYROIDAB in the last 72 hours. Anemia Panel: No results for  input(s): VITAMINB12, FOLATE, FERRITIN, TIBC, IRON, RETICCTPCT in the last 72 hours.    Radiology Studies: I have reviewed all of the imaging during this hospital visit personally     Scheduled Meds: . sodium chloride   Intravenous Once  . sodium chloride   Intravenous Once  . Chlorhexidine Gluconate Cloth  6 each Topical Daily  . escitalopram  20 mg Oral QHS  . fluticasone  2 spray Each Nare QHS  . gabapentin  400 mg Oral Q24H  . gabapentin  800 mg Oral BID  . hydroxypropyl methylcellulose / hypromellose  1 drop Both Eyes Daily  . levothyroxine  75 mcg Oral QAC breakfast  . OXcarbazepine  150 mg Oral BID  . pantoprazole (PROTONIX) IV  40 mg Intravenous Q12H  . polyvinyl alcohol  1 drop Both Eyes Daily  . pravastatin  40 mg Oral q1800  . QUEtiapine  25 mg Oral QHS   Continuous Infusions: . cefTRIAXone (ROCEPHIN)  IV Stopped (11/06/19 1730)  . octreotide  (SANDOSTATIN)    IV infusion 50 mcg/hr (11/07/19 0942)     LOS: 1 day        Rebekah Tomczak Gerome Apley, MD

## 2019-11-07 NOTE — Anesthesia Postprocedure Evaluation (Signed)
Anesthesia Post Note  Patient: [name]  Procedure(s) Performed: ESOPHAGOGASTRODUODENOSCOPY (EGD) WITH PROPOFOL (N/A ) HEMOSTASIS CONTROL HEMOSTASIS CLIP PLACEMENT     Patient location during evaluation: PACU Anesthesia Type: MAC Level of consciousness: awake and alert Pain management: pain level controlled Vital Signs Assessment: post-procedure vital signs reviewed and stable Respiratory status: spontaneous breathing, nonlabored ventilation, respiratory function stable and patient connected to nasal cannula oxygen Cardiovascular status: stable and blood pressure returned to baseline Postop Assessment: no apparent nausea or vomiting Anesthetic complications: no    Last Vitals:  Vitals:   11/07/19 1608 11/07/19 1722  BP:  (!) 153/72  Pulse:    Resp:  (!) 28  Temp: 36.6 C 36.6 C  SpO2: 95% 100%    Last Pain:  Vitals:   11/07/19 1722  TempSrc: Axillary  PainSc:                  Erva Koke S

## 2019-11-07 NOTE — Anesthesia Preprocedure Evaluation (Signed)
Anesthesia Evaluation  Patient identified by MRN, date of birth, ID band Patient awake    Reviewed: Allergy & Precautions, NPO status , Patient's Chart, lab work & pertinent test results  Airway Mallampati: II  TM Distance: >3 FB Neck ROM: Full    Dental no notable dental hx.    Pulmonary neg pulmonary ROS, former smoker,    Pulmonary exam normal breath sounds clear to auscultation       Cardiovascular hypertension, + Valvular Problems/Murmurs AI  Rhythm:Regular Rate:Normal + Systolic murmurs    Neuro/Psych TIACVA negative psych ROS   GI/Hepatic negative GI ROS, Neg liver ROS,   Endo/Other  Hypothyroidism Metastatic neuroendocrine tumor   Renal/GU negative Renal ROS  negative genitourinary   Musculoskeletal negative musculoskeletal ROS (+)   Abdominal   Peds negative pediatric ROS (+)  Hematology  (+) anemia ,   Anesthesia Other Findings   Reproductive/Obstetrics negative OB ROS                             Anesthesia Physical Anesthesia Plan  ASA: IV  Anesthesia Plan: MAC   Post-op Pain Management:    Induction: Intravenous  PONV Risk Score and Plan: 0  Airway Management Planned: Simple Face Mask  Additional Equipment:   Intra-op Plan:   Post-operative Plan:   Informed Consent: I have reviewed the patients History and Physical, chart, labs and discussed the procedure including the risks, benefits and alternatives for the proposed anesthesia with the patient or authorized representative who has indicated his/her understanding and acceptance.     Dental advisory given  Plan Discussed with: CRNA and Surgeon  Anesthesia Plan Comments:         Anesthesia Quick Evaluation

## 2019-11-07 NOTE — H&P (View-Only) (Signed)
Patient ID: [name], female   DOB: Dec 16, 1933, 86 y.o.   MRN: [id number]    Progress Note   Subjective   Day # 1 CC: melena ,progressive anemia  HGB 6.4 this am -was supposed to get transfused yesterday afternoon, somehow that did not happen, transfusing one unit now WBC2.5, PLts 76 INR 1.4  Patient quite uncomfortable again this morning complaining of bladder fullness question spasms.  She did not get Foley placed last evening, she has been putting out urine with the external device but just had bladder scan which shows 400 cc residual.  No bowel movement since admit  Husband at bedside who says her stools have been dark off and on for couple of months, would be dark for several days then clear then recur and over the past week have been consistently dark/black.       Objective   Vital signs in last 24 hours: Temp:  [98 F (36.7 C)-98.6 F (37 C)] 98.4 F (36.9 C) (04/10 0612) Pulse Rate:  [59-95] 59 (04/10 0612) Resp:  [17-20] 18 (04/10 0612) BP: (132-187)/(43-82) 156/49 (04/10 0612) SpO2:  [94 %-99 %] 98 % (04/10 0612) Weight:  [90.7 kg] 90.7 kg (04/09 1206) Last BM Date: 11/06/19 General: Elderly white female in uncomfortable, husband at bedside Heart:  Regular rate and rhythm; no murmurs Lungs: Respirations even and unlabored, lungs CTA bilaterally Abdomen:  Soft, mild tenderness across the lower abdomen/bladder and nondistended. Normal bowel sounds. Extremities: 2+ edema bilateral lower extremities Neurologic:  Alert and oriented,  grossly normal neurologically. Psych:  Cooperative. Normal mood and affect.  Intake/Output from previous day: 04/09 0701 - 04/10 0700 In: 518.3 [I.V.:102; Blood:315; IV Piggyback:101.3] Out: 1600 [Urine:1600] Intake/Output this shift: No intake/output data recorded.  Lab Results: Recent Labs    11/06/19 1024 11/07/19 0409  WBC 2.6* 2.5*  HGB 6.8* 6.4*  HCT 22.6* 21.8*  PLT 77* 76*   BMET Recent Labs     11/06/19 1024 11/07/19 0409  NA 136 137  K 4.6 4.5  CL 106 106  CO2 25 22  GLUCOSE 114* 115*  BUN 14 11  CREATININE 0.83 0.79  CALCIUM 8.6* 8.6*   LFT Recent Labs    11/06/19 1024  PROT 5.6*  ALBUMIN 3.0*  AST 21  ALT 8  ALKPHOS 115  BILITOT 0.4   PT/INR Recent Labs    11/06/19 1337 11/07/19 0409  LABPROT 17.0* 17.3*  INR 1.4* 1.4*    Studies/Results: CT Chest W Contrast  Result Date: 11/06/2019 CLINICAL DATA:  Gastrointestinal cancer staging. EXAM: CT CHEST, ABDOMEN, AND PELVIS WITH CONTRAST TECHNIQUE: Multidetector CT imaging of the chest, abdomen and pelvis was performed following the standard protocol during bolus administration of intravenous contrast. CONTRAST:  153mL OMNIPAQUE IOHEXOL 300 MG/ML  SOLN COMPARISON:  07/06/2019. FINDINGS: CT CHEST FINDINGS Cardiovascular: Atherosclerotic calcification of the aorta, aortic valve and coronary arteries. Heart is enlarged. No pericardial effusion. Mediastinum/Nodes: No pathologically enlarged mediastinal or hilar lymph nodes. Left axillary lymph nodes measure up to 10 mm (2/20), increased from 6 mm. No right axillary adenopathy. Esophagus is unremarkable. Lungs/Pleura: Image quality is somewhat degraded by respiratory motion. There are numerous hematogenously distributed tiny bilateral pulmonary nodules, measuring 5 mm or less in size, as before. Focal volume loss along the anterior margin of the minor fissure (6/49), stable. No pleural fluid. Airway is unremarkable. Musculoskeletal: Degenerative changes in the spine. No worrisome lytic or sclerotic lesions. Old sternal fracture. CT ABDOMEN PELVIS FINDINGS Hepatobiliary: A vague  area mildly decreased attenuation in the lateral dome of the liver (2/39) is unchanged, as is a 2.1 cm low-attenuation lesion left hepatic lobe (2/44). Largest heterogeneous lesion is seen in segment 3 of the liver, measuring 3.2 x 3.3 cm (2/52), also stable. There may be additional smaller hypoattenuating  lesions in the right hepatic lobe similar to prior exam. Stones are seen in a contracted gallbladder. Mild intrahepatic biliary ductal dilatation, unchanged. Pancreas: Negative. Spleen: Negative. Adrenals/Urinary Tract: Adrenal glands are unremarkable. Subcentimeter low-attenuation lesion in the right kidney is too small to characterize. Ureters are decompressed. There may be minimal bladder wall thickening. Stomach/Bowel: Stomach, small bowel and colon are unremarkable. Appendix is not well-visualized. Vascular/Lymphatic: Atherosclerotic calcification of the aorta without aneurysm. Filter is seen in the inferior vena cava. No pathologically enlarged lymph nodes. Reproductive: Hysterectomy.  No adnexal mass. Other: No free fluid. Midline laxity ventral abdominal wall at the umbilicus. Mesenteries and peritoneum are otherwise unremarkable. Musculoskeletal: No worrisome lytic or sclerotic lesions S shaped scoliosis. IMPRESSION: 1. Pulmonary and hepatic metastatic disease appears stable from 07/06/2019. 2. Minimally enlarged left axillary lymph nodes are nonspecific. Question recent COVID-19 vaccination. Please correlate clinically. 3. No additional evidence of metastatic disease. 4. Cholelithiasis. 5. Aortic atherosclerosis (ICD10-I70.0). Coronary artery calcification. Electronically Signed   By: Lorin Picket M.D.   On: 11/06/2019 15:43   CT Abdomen Pelvis W Contrast  Result Date: 11/06/2019 CLINICAL DATA:  Gastrointestinal cancer staging. EXAM: CT CHEST, ABDOMEN, AND PELVIS WITH CONTRAST TECHNIQUE: Multidetector CT imaging of the chest, abdomen and pelvis was performed following the standard protocol during bolus administration of intravenous contrast. CONTRAST:  122mL OMNIPAQUE IOHEXOL 300 MG/ML  SOLN COMPARISON:  07/06/2019. FINDINGS: CT CHEST FINDINGS Cardiovascular: Atherosclerotic calcification of the aorta, aortic valve and coronary arteries. Heart is enlarged. No pericardial effusion. Mediastinum/Nodes:  No pathologically enlarged mediastinal or hilar lymph nodes. Left axillary lymph nodes measure up to 10 mm (2/20), increased from 6 mm. No right axillary adenopathy. Esophagus is unremarkable. Lungs/Pleura: Image quality is somewhat degraded by respiratory motion. There are numerous hematogenously distributed tiny bilateral pulmonary nodules, measuring 5 mm or less in size, as before. Focal volume loss along the anterior margin of the minor fissure (6/49), stable. No pleural fluid. Airway is unremarkable. Musculoskeletal: Degenerative changes in the spine. No worrisome lytic or sclerotic lesions. Old sternal fracture. CT ABDOMEN PELVIS FINDINGS Hepatobiliary: A vague area mildly decreased attenuation in the lateral dome of the liver (2/39) is unchanged, as is a 2.1 cm low-attenuation lesion left hepatic lobe (2/44). Largest heterogeneous lesion is seen in segment 3 of the liver, measuring 3.2 x 3.3 cm (2/52), also stable. There may be additional smaller hypoattenuating lesions in the right hepatic lobe similar to prior exam. Stones are seen in a contracted gallbladder. Mild intrahepatic biliary ductal dilatation, unchanged. Pancreas: Negative. Spleen: Negative. Adrenals/Urinary Tract: Adrenal glands are unremarkable. Subcentimeter low-attenuation lesion in the right kidney is too small to characterize. Ureters are decompressed. There may be minimal bladder wall thickening. Stomach/Bowel: Stomach, small bowel and colon are unremarkable. Appendix is not well-visualized. Vascular/Lymphatic: Atherosclerotic calcification of the aorta without aneurysm. Filter is seen in the inferior vena cava. No pathologically enlarged lymph nodes. Reproductive: Hysterectomy.  No adnexal mass. Other: No free fluid. Midline laxity ventral abdominal wall at the umbilicus. Mesenteries and peritoneum are otherwise unremarkable. Musculoskeletal: No worrisome lytic or sclerotic lesions S shaped scoliosis. IMPRESSION: 1. Pulmonary and hepatic  metastatic disease appears stable from 07/06/2019. 2. Minimally enlarged left axillary lymph nodes  are nonspecific. Question recent COVID-19 vaccination. Please correlate clinically. 3. No additional evidence of metastatic disease. 4. Cholelithiasis. 5. Aortic atherosclerosis (ICD10-I70.0). Coronary artery calcification. Electronically Signed   By: Lorin Picket M.D.   On: 11/06/2019 15:43       Assessment / Plan:    #15 86 year old white female with metastatic neuroendocrine tumor of the lung metastatic to liver, who has been on chemotherapy.  Admitted after outpatient labs showed progressive anemia with hemoglobin of 6.9. He has had a chronic pancytopenia with hemoglobins ranging from 8-10 over the past several months  Now with intermittent dark stools over the past several weeks  No active bleeding since admit, hemoglobin has drifted  She was supposed to have been transfused last night but that did not occur, receiving 1 unit currently  #2 chronic anticoagulation-on Xarelto-none over the past 24 hours plus. History of PE #3 history of atrial fibrillation 4.  Status post pacemaker 5.  Seizure disorder 6.  History of previous CVA/TIA and cerebellar degeneration with cognitive impairment #7 debilitation-nurses said required 4 people to get her transferred last evening #8 bladder pain/urinary retention-defer to hospitalist but would be more comfortable with a Foley  Plan; n.p.o. this morning We will order 1 more unit of packed RBCs Continue to hold Xarelto Continue IV PPI twice daily  Patient scheduled for EGD this afternoon with Dr. Havery Moros, discussed in detail with patient's husband, who will sign permit. Further plans pending findings at EGD   Active Problems:   Upper GI bleed     LOS: 1 day   Latyra Jaye PA-C 11/07/2019, 8:32 AM

## 2019-11-08 DIAGNOSIS — E039 Hypothyroidism, unspecified: Secondary | ICD-10-CM

## 2019-11-08 LAB — BASIC METABOLIC PANEL
Anion gap: 9 (ref 5–15)
BUN: 12 mg/dL (ref 8–23)
CO2: 20 mmol/L — ABNORMAL LOW (ref 22–32)
Calcium: 8.5 mg/dL — ABNORMAL LOW (ref 8.9–10.3)
Chloride: 107 mmol/L (ref 98–111)
Creatinine, Ser: 0.84 mg/dL (ref 0.44–1.00)
GFR calc Af Amer: >60 mL/min (ref >60)
GFR calc non Af Amer: >60 mL/min (ref >60)
Glucose, Bld: 121 mg/dL — ABNORMAL HIGH (ref 70–99)
Potassium: 4.1 mmol/L (ref 3.5–5.1)
Sodium: 136 mmol/L (ref 135–145)

## 2019-11-08 LAB — CBC WITH DIFFERENTIAL/PLATELET
Abs Immature Granulocytes: 0.03 10*3/uL (ref 0.00–0.07)
Basophils Absolute: 0 10*3/uL (ref 0.0–0.1)
Basophils Relative: 0 %
Eosinophils Absolute: 0 10*3/uL (ref 0.0–0.5)
Eosinophils Relative: 0 %
HCT: 29.4 % — ABNORMAL LOW (ref 36.0–46.0)
Hemoglobin: 9.8 g/dL — ABNORMAL LOW (ref 12.0–15.0)
Immature Granulocytes: 1 %
Lymphocytes Relative: 21 %
Lymphs Abs: 0.7 10*3/uL (ref 0.7–4.0)
MCH: 32.2 pg (ref 26.0–34.0)
MCHC: 33.3 g/dL (ref 30.0–36.0)
MCV: 96.7 fL (ref 80.0–100.0)
Monocytes Absolute: 0.5 10*3/uL (ref 0.1–1.0)
Monocytes Relative: 15 %
Neutro Abs: 1.9 10*3/uL (ref 1.7–7.7)
Neutrophils Relative %: 63 %
Platelets: 78 10*3/uL — ABNORMAL LOW (ref 150–400)
RBC: 3.04 MIL/uL — ABNORMAL LOW (ref 3.87–5.11)
RDW: 21.6 % — ABNORMAL HIGH (ref 11.5–15.5)
WBC: 3 10*3/uL — ABNORMAL LOW (ref 4.0–10.5)
nRBC: 0 % (ref 0.0–0.2)

## 2019-11-08 LAB — BPAM RBC
Blood Product Expiration Date: 202104232359
Blood Product Expiration Date: 202104252359
ISSUE DATE / TIME: 202104100552
ISSUE DATE / TIME: 202104101401
Unit Type and Rh: 6200
Unit Type and Rh: 6200

## 2019-11-08 LAB — TYPE AND SCREEN
ABO/RH(D): A POS
Antibody Screen: NEGATIVE
Unit division: 0
Unit division: 0

## 2019-11-08 NOTE — Progress Notes (Signed)
Patient ID: [name], female   DOB: [birth year], 86 y.o.   MRN: [id number]    Progress Note   Subjective  Day # 3 CC; melena and progressive anemia in setting of Xarelto  EGD yesterday-normal esophagus, for gastric polyps with oozing which were removed and treated, she had 5 AVMs versus flat vascular polyps treated with APC and multiple additional benign-appearing gastric polyps, normal duodenal bulb  Hemoglobin up to 9.8 after 3 units WBC 3, platelets 78  In much better spirits today, husband at bedside.  Patient is comfortable and no longer complaining of severe bladder pain.  Foley in place Bowel movement this morning no obvious melena    Objective   Vital signs in last 24 hours: Temp:  [97.8 F (36.6 C)-99.5 F (37.5 C)] 98.1 F (36.7 C) (04/11 0349) Pulse Rate:  [57-95] 58 (04/11 0349) Resp:  [18-33] 22 (04/11 0349) BP: (135-196)/(49-101) 169/58 (04/11 0349) SpO2:  [93 %-100 %] 96 % (04/11 0349) Weight:  [90.7 kg] 90.7 kg (04/10 1530) Last BM Date: 11/06/19 General: Elderly white female in NAD, chronically ill-appearing Heart:  Regular rate and rhythm; no murmurs Lungs: Respirations even and unlabored, lungs CTA bilaterally Abdomen:  Soft, nontender and nondistended. Normal bowel sounds. Extremities:  Without edema. Neurologic:  Alert and oriented,  grossly normal neurologically. Psych:  Cooperative. Normal mood and affect.  Intake/Output from previous day: 04/10 0701 - 04/11 0700 In: 2033.4 [I.V.:1370.4; Blood:663] Out: 2850 [Urine:2850] Intake/Output this shift: No intake/output data recorded.  Lab Results: Recent Labs    11/06/19 1024 11/06/19 1024 11/07/19 0409 11/07/19 0409 11/07/19 1307 11/07/19 2050 11/08/19 0440  WBC 2.6*  --  2.5*  --   --   --  3.0*  HGB 6.8*  --  6.4*   < > 8.6* 9.9* 9.8*  HCT 22.6*   < > 21.8*   < > 26.7* 29.7* 29.4*  PLT 77*  --  76*  --   --   --  78*   < > = values in this interval not displayed.   BMET Recent  Labs    11/06/19 1024 11/07/19 0409 11/08/19 0440  NA 136 137 136  K 4.6 4.5 4.1  CL 106 106 107  CO2 25 22 20*  GLUCOSE 114* 115* 121*  BUN 14 11 12   CREATININE 0.83 0.79 0.84  CALCIUM 8.6* 8.6* 8.5*   LFT Recent Labs    11/06/19 1024  PROT 5.6*  ALBUMIN 3.0*  AST 21  ALT 8  ALKPHOS 115  BILITOT 0.4   PT/INR Recent Labs    11/06/19 1337 11/07/19 0409  LABPROT 17.0* 17.3*  INR 1.4* 1.4*    Studies/Results: CT Chest W Contrast  Result Date: 11/06/2019 CLINICAL DATA:  Gastrointestinal cancer staging. EXAM: CT CHEST, ABDOMEN, AND PELVIS WITH CONTRAST TECHNIQUE: Multidetector CT imaging of the chest, abdomen and pelvis was performed following the standard protocol during bolus administration of intravenous contrast. CONTRAST:  144mL OMNIPAQUE IOHEXOL 300 MG/ML  SOLN COMPARISON:  07/06/2019. FINDINGS: CT CHEST FINDINGS Cardiovascular: Atherosclerotic calcification of the aorta, aortic valve and coronary arteries. Heart is enlarged. No pericardial effusion. Mediastinum/Nodes: No pathologically enlarged mediastinal or hilar lymph nodes. Left axillary lymph nodes measure up to 10 mm (2/20), increased from 6 mm. No right axillary adenopathy. Esophagus is unremarkable. Lungs/Pleura: Image quality is somewhat degraded by respiratory motion. There are numerous hematogenously distributed tiny bilateral pulmonary nodules, measuring 5 mm or less in size, as before. Focal volume loss along the  anterior margin of the minor fissure (6/49), stable. No pleural fluid. Airway is unremarkable. Musculoskeletal: Degenerative changes in the spine. No worrisome lytic or sclerotic lesions. Old sternal fracture. CT ABDOMEN PELVIS FINDINGS Hepatobiliary: A vague area mildly decreased attenuation in the lateral dome of the liver (2/39) is unchanged, as is a 2.1 cm low-attenuation lesion left hepatic lobe (2/44). Largest heterogeneous lesion is seen in segment 3 of the liver, measuring 3.2 x 3.3 cm (2/52), also  stable. There may be additional smaller hypoattenuating lesions in the right hepatic lobe similar to prior exam. Stones are seen in a contracted gallbladder. Mild intrahepatic biliary ductal dilatation, unchanged. Pancreas: Negative. Spleen: Negative. Adrenals/Urinary Tract: Adrenal glands are unremarkable. Subcentimeter low-attenuation lesion in the right kidney is too small to characterize. Ureters are decompressed. There may be minimal bladder wall thickening. Stomach/Bowel: Stomach, small bowel and colon are unremarkable. Appendix is not well-visualized. Vascular/Lymphatic: Atherosclerotic calcification of the aorta without aneurysm. Filter is seen in the inferior vena cava. No pathologically enlarged lymph nodes. Reproductive: Hysterectomy.  No adnexal mass. Other: No free fluid. Midline laxity ventral abdominal wall at the umbilicus. Mesenteries and peritoneum are otherwise unremarkable. Musculoskeletal: No worrisome lytic or sclerotic lesions S shaped scoliosis. IMPRESSION: 1. Pulmonary and hepatic metastatic disease appears stable from 07/06/2019. 2. Minimally enlarged left axillary lymph nodes are nonspecific. Question recent COVID-19 vaccination. Please correlate clinically. 3. No additional evidence of metastatic disease. 4. Cholelithiasis. 5. Aortic atherosclerosis (ICD10-I70.0). Coronary artery calcification. Electronically Signed   By: Lorin Picket M.D.   On: 11/06/2019 15:43   CT Abdomen Pelvis W Contrast  Result Date: 11/06/2019 CLINICAL DATA:  Gastrointestinal cancer staging. EXAM: CT CHEST, ABDOMEN, AND PELVIS WITH CONTRAST TECHNIQUE: Multidetector CT imaging of the chest, abdomen and pelvis was performed following the standard protocol during bolus administration of intravenous contrast. CONTRAST:  166mL OMNIPAQUE IOHEXOL 300 MG/ML  SOLN COMPARISON:  07/06/2019. FINDINGS: CT CHEST FINDINGS Cardiovascular: Atherosclerotic calcification of the aorta, aortic valve and coronary arteries. Heart  is enlarged. No pericardial effusion. Mediastinum/Nodes: No pathologically enlarged mediastinal or hilar lymph nodes. Left axillary lymph nodes measure up to 10 mm (2/20), increased from 6 mm. No right axillary adenopathy. Esophagus is unremarkable. Lungs/Pleura: Image quality is somewhat degraded by respiratory motion. There are numerous hematogenously distributed tiny bilateral pulmonary nodules, measuring 5 mm or less in size, as before. Focal volume loss along the anterior margin of the minor fissure (6/49), stable. No pleural fluid. Airway is unremarkable. Musculoskeletal: Degenerative changes in the spine. No worrisome lytic or sclerotic lesions. Old sternal fracture. CT ABDOMEN PELVIS FINDINGS Hepatobiliary: A vague area mildly decreased attenuation in the lateral dome of the liver (2/39) is unchanged, as is a 2.1 cm low-attenuation lesion left hepatic lobe (2/44). Largest heterogeneous lesion is seen in segment 3 of the liver, measuring 3.2 x 3.3 cm (2/52), also stable. There may be additional smaller hypoattenuating lesions in the right hepatic lobe similar to prior exam. Stones are seen in a contracted gallbladder. Mild intrahepatic biliary ductal dilatation, unchanged. Pancreas: Negative. Spleen: Negative. Adrenals/Urinary Tract: Adrenal glands are unremarkable. Subcentimeter low-attenuation lesion in the right kidney is too small to characterize. Ureters are decompressed. There may be minimal bladder wall thickening. Stomach/Bowel: Stomach, small bowel and colon are unremarkable. Appendix is not well-visualized. Vascular/Lymphatic: Atherosclerotic calcification of the aorta without aneurysm. Filter is seen in the inferior vena cava. No pathologically enlarged lymph nodes. Reproductive: Hysterectomy.  No adnexal mass. Other: No free fluid. Midline laxity ventral abdominal wall at the  umbilicus. Mesenteries and peritoneum are otherwise unremarkable. Musculoskeletal: No worrisome lytic or sclerotic lesions  S shaped scoliosis. IMPRESSION: 1. Pulmonary and hepatic metastatic disease appears stable from 07/06/2019. 2. Minimally enlarged left axillary lymph nodes are nonspecific. Question recent COVID-19 vaccination. Please correlate clinically. 3. No additional evidence of metastatic disease. 4. Cholelithiasis. 5. Aortic atherosclerosis (ICD10-I70.0). Coronary artery calcification. Electronically Signed   By: Lorin Picket M.D.   On: 11/06/2019 15:43       Assessment / Plan:    #51 86 year old white female with metastatic neuroendocrine tumor of the lung metastatic to liver, on chemotherapy, who was admitted after outpatient labs showed progressive anemia, hemoglobin 6.9 This is in the setting of a chronic pancytopenia with normal hemoglobin ranging between 8-10  Patient complaining of intermittent dark black stools On Xarelto secondary to history of PE and A. Fib  Hemoglobin much improved today post 3 units of packed RBCs EGD yesterday with finding of gastric polyps with oozing which were treated and 5 AVMs which were treated with APC.  No active bleeding since  Plan; full liquid diet, advance tomorrow Continue twice daily PPI Would like to hold Xarelto 5 to 7 days if okay with oncology. Trend hemoglobin If hemoglobin stable tomorrow she may be able to be discharged.    Principal Problem:   Upper GI bleed Active Problems:   Hyperlipemia   Essential hypertension   GERD (gastroesophageal reflux disease)   Cerebellar degeneration ([name])   Depression   Hypothyroidism   Paroxysmal atrial fibrillation ([name])   Morbid obesity ([place])   Meningioma ([name])   Neuroendocrine carcinoma metastatic to liver [facility])   Sick sinus syndrome ([name])   Acute blood loss anemia   Anemia in other chronic diseases classified elsewhere     LOS: 2 days   Gaige Fussner EsterwoodPA-C 11/08/2019, 8:57 AM

## 2019-11-08 NOTE — Progress Notes (Signed)
PROGRESS NOTE    [name]  ZOX:[id number] DOB: June 29, 1934 DOA: 11/06/2019 PCP: [name], MD    Brief Narrative:  Patient was admitted to the hospital with working diagnosis of acute blood loss anemia due to suspected upper GI bleed.  86 year old female who presented with melena.  She does have significant past medical history of for metastatic neuroendocrine tumor of the lung, history of pulmonary embolism status post IVC filter, paroxysmal atrial relation status post pacemaker, history of CVA, meningioma, seizures, hypertension, dyslipidemia, hypothyroidism, depression, cerebellar degeneration, and cognitive impairment. Patient reported to have melanotic stools for about, intermittently, but more severe and persistent over the last 7 days, with no abdominal pain, nausea or vomiting.  She is wheelchair-bound due to ambulatory dysfunction.  Patient was found to have a hemoglobin of 6.8 as an outpatient and she was referred to the hospital for further evaluation. On her initial physical examination blood pressure 147/51, pulse rate 59, respiratory rate 18, temperature 98, oxygen saturation 99%.  Clear to auscultation bilaterally, heart S1-S2 present rhythm, abdomen was diffusely tender to palpation, no rebound or guarding, positive nonpitting bilateral lower extremity edema.  136, potassium 4.6, chloride 106, bicarb 25, glucose 114, BUN 14, creatinine 0.83, white count 3.6, hemoglobin 6.8, hematocrit 2.6, platelets 77.   She had one unit PRBC transfused with persistent significant anemia, 2nd unit for this am. GI planning for endoscopy today.      Assessment & Plan:   Principal Problem:   Upper GI bleed Active Problems:   Hyperlipemia   Essential hypertension   GERD (gastroesophageal reflux disease)   Cerebellar degeneration ([name])   Depression   Hypothyroidism   Paroxysmal atrial fibrillation ([name])   Morbid obesity ([name])   Meningioma ([name])   Neuroendocrine carcinoma  metastatic to liver [facility])   Sick sinus syndrome ([name])   Acute blood loss anemia   Anemia in other chronic diseases classified elsewhere    1. Acute blood loss anemia, suspected upper GI bleed. Sp 2 units PRBC transfusion Hgb is up to 9,8. EGD with bleeding polyps and AVMs. Treated endoscopically.   Dc octreotide, continue IV pantoprazole and follow on cell count in am, patient will continue to hold on anticoagulation for now.   2. Paroxysmal atrial fibrillation with sick sinus syndrome. Rate controlled, holding on anticoagulation.   3. Hypothyroid. On levothyroxine.   4. Hx CVA, cerebellar degeneration, cognitive impairment. Patient is non ambulatory, wheelchair bound. On escitalopram, quetiapine and oxcarbazepine  5. Obesity. Dyslipidemia.  Continue statin, outpatient follow up.   DVT prophylaxis: scd   Code Status:  full Family Communication: no family at the bedside  Disposition Plan/ discharge barriers: patient from home barrier for dc acute blood loss anemia, need continue monitoring, possible dc in am  Consultants:   GI   Procedures:    endoscopy    Subjective: Patient is feeling better, no abdominal pain, no nausea or vomiting, mild dark stools but no bright blood.   Objective: Vitals:   11/07/19 1740 11/07/19 1815 11/07/19 2108 11/08/19 0349  BP: (!) 136/49  135/67 (!) 169/58  Pulse: 84  95 (!) 58  Resp: (!) 33 20 20 (!) 22  Temp:   98.8 F (37.1 C) 98.1 F (36.7 C)  TempSrc:   Oral Oral  SpO2: 96%  93% 96%  Weight:      Height:        Intake/Output Summary (Last 24 hours) at 11/08/2019 0942 Last data filed at 11/08/2019 0900 Gross per 24  hour  Intake 1685.36 ml  Output 3750 ml  Net -2064.64 ml   Filed Weights   11/06/19 1206 11/07/19 1530  Weight: 90.7 kg 90.7 kg    Examination:   General: Not in pain or dyspnea, deconditioned  Neurology: Awake and alert, non focal  E ENT: mild pallor, no icterus, oral mucosa moist Cardiovascular: No  JVD. S1-S2 present, rhythmic, no gallops, rubs, or murmurs. ++ non pitting bilateral lower extremity edema. Pulmonary: positive breath sounds bilaterally, adequate air movement, no wheezing, rhonchi or rales. Gastrointestinal. Abdomen with  no organomegaly, non tender, no rebound or guarding Skin. No rashes Musculoskeletal: no joint deformities     Data Reviewed: I have personally reviewed following labs and imaging studies  CBC: Recent Labs  Lab 11/06/19 1024 11/07/19 0409 11/07/19 1307 11/07/19 2050 11/08/19 0440  WBC 2.6* 2.5*  --   --  3.0*  NEUTROABS 1.6*  --   --   --  1.9  HGB 6.8* 6.4* 8.6* 9.9* 9.8*  HCT 22.6* 21.8* 26.7* 29.7* 29.4*  MCV 108.1* 110.1*  --   --  96.7  PLT 77* 76*  --   --  78*   Basic Metabolic Panel: Recent Labs  Lab 11/06/19 1024 11/07/19 0409 11/08/19 0440  NA 136 137 136  K 4.6 4.5 4.1  CL 106 106 107  CO2 25 22 20*  GLUCOSE 114* 115* 121*  BUN 14 11 12   CREATININE 0.83 0.79 0.84  CALCIUM 8.6* 8.6* 8.5*   GFR: Estimated Creatinine Clearance: 55.4 mL/min (by C-G formula based on SCr of 0.84 mg/dL). Liver Function Tests: Recent Labs  Lab 11/06/19 1024  AST 21  ALT 8  ALKPHOS 115  BILITOT 0.4  PROT 5.6*  ALBUMIN 3.0*   No results for input(s): LIPASE, AMYLASE in the last 168 hours. No results for input(s): AMMONIA in the last 168 hours. Coagulation Profile: Recent Labs  Lab 11/06/19 1337 11/07/19 0409  INR 1.4* 1.4*   Cardiac Enzymes: No results for input(s): CKTOTAL, CKMB, CKMBINDEX, TROPONINI in the last 168 hours. BNP (last 3 results) No results for input(s): PROBNP in the last 8760 hours. HbA1C: No results for input(s): HGBA1C in the last 72 hours. CBG: No results for input(s): GLUCAP in the last 168 hours. Lipid Profile: No results for input(s): CHOL, HDL, LDLCALC, TRIG, CHOLHDL, LDLDIRECT in the last 72 hours. Thyroid Function Tests: No results for input(s): TSH, T4TOTAL, FREET4, T3FREE, THYROIDAB in the last  72 hours. Anemia Panel: No results for input(s): VITAMINB12, FOLATE, FERRITIN, TIBC, IRON, RETICCTPCT in the last 72 hours.    Radiology Studies: I have reviewed all of the imaging during this hospital visit personally     Scheduled Meds: . sodium chloride   Intravenous Once  . Chlorhexidine Gluconate Cloth  6 each Topical Daily  . escitalopram  20 mg Oral QHS  . fluticasone  2 spray Each Nare QHS  . gabapentin  400 mg Oral Q24H  . gabapentin  800 mg Oral BID  . hydroxypropyl methylcellulose / hypromellose  1 drop Both Eyes Daily  . levothyroxine  75 mcg Oral QAC breakfast  . OXcarbazepine  150 mg Oral BID  . pantoprazole (PROTONIX) IV  40 mg Intravenous Q12H  . polyvinyl alcohol  1 drop Both Eyes Daily  . pravastatin  40 mg Oral q1800  . QUEtiapine  25 mg Oral QHS   Continuous Infusions: . cefTRIAXone (ROCEPHIN)  IV Stopped (11/06/19 1730)  . octreotide  (SANDOSTATIN)  IV infusion 50 mcg/hr (11/08/19 0855)     LOS: 2 days        Raymont Andreoni Gerome Apley, MD

## 2019-11-09 ENCOUNTER — Encounter: Payer: Self-pay | Admitting: *Deleted

## 2019-11-09 DIAGNOSIS — I10 Essential (primary) hypertension: Secondary | ICD-10-CM

## 2019-11-09 DIAGNOSIS — K219 Gastro-esophageal reflux disease without esophagitis: Secondary | ICD-10-CM

## 2019-11-09 LAB — CBC WITH DIFFERENTIAL/PLATELET
Abs Immature Granulocytes: 0.03 10*3/uL (ref 0.00–0.07)
Basophils Absolute: 0 10*3/uL (ref 0.0–0.1)
Basophils Relative: 1 %
Eosinophils Absolute: 0.2 10*3/uL (ref 0.0–0.5)
Eosinophils Relative: 7 %
HCT: 29.1 % — ABNORMAL LOW (ref 36.0–46.0)
Hemoglobin: 9.3 g/dL — ABNORMAL LOW (ref 12.0–15.0)
Immature Granulocytes: 1 %
Lymphocytes Relative: 16 %
Lymphs Abs: 0.5 10*3/uL — ABNORMAL LOW (ref 0.7–4.0)
MCH: 31.3 pg (ref 26.0–34.0)
MCHC: 32 g/dL (ref 30.0–36.0)
MCV: 98 fL (ref 80.0–100.0)
Monocytes Absolute: 0.6 10*3/uL (ref 0.1–1.0)
Monocytes Relative: 17 %
Neutro Abs: 2 10*3/uL (ref 1.7–7.7)
Neutrophils Relative %: 58 %
Platelets: 64 10*3/uL — ABNORMAL LOW (ref 150–400)
RBC: 2.97 MIL/uL — ABNORMAL LOW (ref 3.87–5.11)
RDW: 21.3 % — ABNORMAL HIGH (ref 11.5–15.5)
WBC: 3.3 10*3/uL — ABNORMAL LOW (ref 4.0–10.5)
nRBC: 0 % (ref 0.0–0.2)

## 2019-11-09 MED ORDER — PANTOPRAZOLE SODIUM 40 MG PO TBEC: 40.0000 mg | DELAYED_RELEASE_TABLET | Freq: Two times a day (BID) | ORAL | Status: DC

## 2019-11-09 MED ORDER — PANTOPRAZOLE SODIUM 40 MG PO TBEC: 40.0000 mg | DELAYED_RELEASE_TABLET | Freq: Two times a day (BID) | ORAL | 0 refills | Status: DC

## 2019-11-09 MED FILL — TEMOZOLOMIDE 140 MG CAPS: 140 | 28 days supply | Qty: 10 | Fill #2

## 2019-11-09 MED FILL — CAPECITABINE 500 MG TABLET: 500 | 14 days supply | Qty: 84 | Fill #2

## 2019-11-09 NOTE — Progress Notes (Signed)
Patient's IV removed.  Site WNL.  AVS reviewed with patient's spouse.  Verbalized understanding of discharge instructions, physician follow-up, medications.  Patient transported by wheelchair to main entrance at discharge.  Patient stable at time of discharge.

## 2019-11-09 NOTE — Discharge Summary (Signed)
Physician Discharge Summary  [name] IOX:[id number] DOB: [birth year] DOA: 11/06/2019  PCP: [name], MD  Admit date: 11/06/2019 Discharge date: 11/09/2019  Admitted From: Home  Disposition:  Home   Recommendations for Outpatient Follow-up and new medication changes:  1. Follow up with Dr, [name] in 7 days.  2. Patient placed on pantoprazole 40 mg bid 3. Hold Rivaroxaban until 11/15/19.  4. Follow up with Dr. [name] as scheduled (Oncology)   Home Health: no   Equipment/Devices: no  / patient is non ambulatory  Discharge Condition: stable CODE STATUS: full  Diet recommendation: heart healthy   Brief/Interim Summary: Patient was admitted to the hospital with working diagnosis of acute blood loss anemia due to  upper GI bleed.  86 year old female who presented with melena. She does have significant past medical history of for metastatic neuroendocrine tumor of the lung, history of pulmonary embolism status post IVC filter, paroxysmal atrial fibrillation, status post pacemaker, history of CVA, meningioma, seizures, hypertension, dyslipidemia, hypothyroidism, depression, cerebellar degeneration, and cognitive impairment. Patient reported to have melanotic stools for about 2 months intermittently, but more severe and persistent over the last 7 days, with no abdominal pain, nausea or vomiting.She is wheelchair-bound due to ambulatory dysfunction. Patient was found to have a hemoglobin of 6.8 as an outpatient and she was referred to the hospital for further evaluation. On her initial physical examination blood pressure 147/51, pulse rate 59, respiratory rate18, temperature 98, oxygen saturation 99%. Lungs wwere clear to auscultation bilaterally, heart S1-S2 present rhythm, abdomen was diffusely tender to palpation, no rebound or guarding, positive nonpitting bilateral lower extremity edema. NA 136, potassium 4.6, chloride 106, bicarb 25, glucose 114, BUN 14, creatinine 0.83, white  count 3.6, hemoglobin 6.8, hematocrit 2.6, platelets 77.  SARS COVID-19 negative.  CT chest, abdomen pelvis with no significant acute changes.  She had 2 units PRBC transfused with improvement of Hgb and Hct. Upper endoscopy with bleeding polyps and AVM, locally treated. Patient will be discharged home with pantoprazole bid and hold rivaroxaban for 7 days.   1.  Acute blood loss anemia due to upper GI bleed, bleeding polyps/AVM.  Patient was admitted to the medical ward, she was placed on a remote telemetry monitoring, received intravenous proton pump millimeters, a clear liquid diet and octreotide.  She received 2 units of packed red blood cells, underwent upper endoscopy which showed 4 gastric polyps with oozing, 5 angiodysplastic lesions versus blood vascular polyps.  Lesions treated endoscopically.   No further bleeding, hemoglobin remained stable 9.8-9.3, hematocrit 29.  Her diet was advanced with good toleration.  Patient will continue pantoprazole oral twice daily, recommendations to hold rivaroxaban for 7 days.  Patient will follow-up as an outpatient with gastroenterology for pathology results.   2.  Paroxysmal atrial fibrillation, sick sinus syndrome.  Patient remained rate controlled, anticoagulation was held due to acute bleeding.  3. Hypothyroid. Continue with levothyroxine   4. Hx CVA, cerebellar degeneration and cognitive impairment.  Patient is nonambulatory, wheelchair-bound, continue escitalopram and oxcarbamazepine  5. Obesity class II/ dyslipidemia.  Patient continue statin.  Calculated BMI 34.3  6.  Acute urinary retention.  Patient required indwelling Foley catheter for urine retention.  The catheter will be removed before discharge and will assure appropriate voiding.   Discharge Diagnoses:  Principal Problem:   Upper GI bleed Active Problems:   Hyperlipemia   Essential hypertension   GERD (gastroesophageal reflux disease)   Cerebellar degeneration ([name])    Depression   Hypothyroidism  Paroxysmal atrial fibrillation ([name])   Morbid obesity ([place])   Meningioma ([name])   Neuroendocrine carcinoma metastatic to liver [facility])   Sick sinus syndrome ([place])   Acute blood loss anemia   Anemia in other chronic diseases classified elsewhere    Discharge Instructions   Allergies as of 11/09/2019      Reactions   Levaquin [levofloxacin] Shortness Of Breath   Lisinopril Cough      Medication List    STOP taking these medications   amitriptyline 25 MG tablet Commonly known as: ELAVIL   esomeprazole 40 MG capsule Commonly known as: NexIUM     TAKE these medications   amLODipine 2.5 MG tablet Commonly known as: NORVASC TAKE 1 TABLET DAILY   capecitabine 500 MG tablet Commonly known as: XELODA TAKE 3 TABLETS (1500MG ) BY MOUTH TWICE DAILY AFTER A MEAL. TAKE ON DAYS 1-14 OF EACH 28 DAY CYCLE   escitalopram 20 MG tablet Commonly known as: LEXAPRO TAKE 1 TABLET AT BEDTIME   fluticasone 50 MCG/ACT nasal spray Commonly known as: FLONASE Place 2 sprays into both nostrils at bedtime.   furosemide 20 MG tablet Commonly known as: LASIX TAKE 1 TABLET DAILY AS NEEDED FOR FLUID RETENTION What changed: See the new instructions.   gabapentin 400 MG capsule Commonly known as: NEURONTIN TAKE 1 TO 2 CAPSULES THREE TIMES A DAY What changed:   how much to take  how to take this  when to take this  additional instructions   hydroxypropyl methylcellulose / hypromellose 2.5 % ophthalmic solution Commonly known as: ISOPTO TEARS / GONIOVISC Place 1 drop into both eyes daily.   levothyroxine 75 MCG tablet Commonly known as: SYNTHROID Take 1 tablet (75 mcg total) by mouth daily before breakfast.   lovastatin 40 MG tablet Commonly known as: MEVACOR TAKE 1 TABLET AT BEDTIME   Melatonin 3 MG Tbdp Take 3-6 mg by mouth at bedtime as needed. What changed: reasons to take this   ondansetron 8 MG tablet Commonly known as: ZOFRAN Take 1 tablet (8  mg total) by mouth every 8 (eight) hours as needed for nausea or vomiting.   OXcarbazepine 150 MG tablet Commonly known as: TRILEPTAL Take 1 tablet (150 mg total) by mouth 2 (two) times daily.   pantoprazole 40 MG tablet Commonly known as: PROTONIX Take 1 tablet (40 mg total) by mouth 2 (two) times daily.   QUEtiapine 25 MG tablet Commonly known as: SEROquel Take 1 tablet (25 mg total) by mouth at bedtime.   rivaroxaban 20 MG Tabs tablet Commonly known as: Xarelto TAKE 1 TABLET DAILY WITH SUPPER What changed:   how much to take  how to take this  when to take this  additional instructions   silver sulfADIAZINE 1 % cream Commonly known as: Silvadene Apply 1 application topically daily.   temozolomide 140 MG capsule Commonly known as: TEMODAR TAKE 2 CAPSULES BY MOUTH DAILY FOR 5 DAYS,ON DAYS 10-14 OF EACH 28DAY CYCLE. MAY TAKE ON EMPTY STOMACH AT BEDTIME TO DECREASE NAUSEA & VOMITING       Allergies  Allergen Reactions  . Levaquin [Levofloxacin] Shortness Of Breath  . Lisinopril Cough    Consultations:  gi   Procedures/Studies: CT Chest W Contrast  Result Date: 11/06/2019 CLINICAL DATA:  Gastrointestinal cancer staging. EXAM: CT CHEST, ABDOMEN, AND PELVIS WITH CONTRAST TECHNIQUE: Multidetector CT imaging of the chest, abdomen and pelvis was performed following the standard protocol during bolus administration of intravenous contrast. CONTRAST:  160mL OMNIPAQUE IOHEXOL 300 MG/ML  SOLN COMPARISON:  07/06/2019. FINDINGS: CT CHEST FINDINGS Cardiovascular: Atherosclerotic calcification of the aorta, aortic valve and coronary arteries. Heart is enlarged. No pericardial effusion. Mediastinum/Nodes: No pathologically enlarged mediastinal or hilar lymph nodes. Left axillary lymph nodes measure up to 10 mm (2/20), increased from 6 mm. No right axillary adenopathy. Esophagus is unremarkable. Lungs/Pleura: Image quality is somewhat degraded by respiratory motion. There are  numerous hematogenously distributed tiny bilateral pulmonary nodules, measuring 5 mm or less in size, as before. Focal volume loss along the anterior margin of the minor fissure (6/49), stable. No pleural fluid. Airway is unremarkable. Musculoskeletal: Degenerative changes in the spine. No worrisome lytic or sclerotic lesions. Old sternal fracture. CT ABDOMEN PELVIS FINDINGS Hepatobiliary: A vague area mildly decreased attenuation in the lateral dome of the liver (2/39) is unchanged, as is a 2.1 cm low-attenuation lesion left hepatic lobe (2/44). Largest heterogeneous lesion is seen in segment 3 of the liver, measuring 3.2 x 3.3 cm (2/52), also stable. There may be additional smaller hypoattenuating lesions in the right hepatic lobe similar to prior exam. Stones are seen in a contracted gallbladder. Mild intrahepatic biliary ductal dilatation, unchanged. Pancreas: Negative. Spleen: Negative. Adrenals/Urinary Tract: Adrenal glands are unremarkable. Subcentimeter low-attenuation lesion in the right kidney is too small to characterize. Ureters are decompressed. There may be minimal bladder wall thickening. Stomach/Bowel: Stomach, small bowel and colon are unremarkable. Appendix is not well-visualized. Vascular/Lymphatic: Atherosclerotic calcification of the aorta without aneurysm. Filter is seen in the inferior vena cava. No pathologically enlarged lymph nodes. Reproductive: Hysterectomy.  No adnexal mass. Other: No free fluid. Midline laxity ventral abdominal wall at the umbilicus. Mesenteries and peritoneum are otherwise unremarkable. Musculoskeletal: No worrisome lytic or sclerotic lesions S shaped scoliosis. IMPRESSION: 1. Pulmonary and hepatic metastatic disease appears stable from 07/06/2019. 2. Minimally enlarged left axillary lymph nodes are nonspecific. Question recent COVID-19 vaccination. Please correlate clinically. 3. No additional evidence of metastatic disease. 4. Cholelithiasis. 5. Aortic  atherosclerosis (ICD10-I70.0). Coronary artery calcification. Electronically Signed   By: Lorin Picket M.D.   On: 11/06/2019 15:43   CT Abdomen Pelvis W Contrast  Result Date: 11/06/2019 CLINICAL DATA:  Gastrointestinal cancer staging. EXAM: CT CHEST, ABDOMEN, AND PELVIS WITH CONTRAST TECHNIQUE: Multidetector CT imaging of the chest, abdomen and pelvis was performed following the standard protocol during bolus administration of intravenous contrast. CONTRAST:  18mL OMNIPAQUE IOHEXOL 300 MG/ML  SOLN COMPARISON:  07/06/2019. FINDINGS: CT CHEST FINDINGS Cardiovascular: Atherosclerotic calcification of the aorta, aortic valve and coronary arteries. Heart is enlarged. No pericardial effusion. Mediastinum/Nodes: No pathologically enlarged mediastinal or hilar lymph nodes. Left axillary lymph nodes measure up to 10 mm (2/20), increased from 6 mm. No right axillary adenopathy. Esophagus is unremarkable. Lungs/Pleura: Image quality is somewhat degraded by respiratory motion. There are numerous hematogenously distributed tiny bilateral pulmonary nodules, measuring 5 mm or less in size, as before. Focal volume loss along the anterior margin of the minor fissure (6/49), stable. No pleural fluid. Airway is unremarkable. Musculoskeletal: Degenerative changes in the spine. No worrisome lytic or sclerotic lesions. Old sternal fracture. CT ABDOMEN PELVIS FINDINGS Hepatobiliary: A vague area mildly decreased attenuation in the lateral dome of the liver (2/39) is unchanged, as is a 2.1 cm low-attenuation lesion left hepatic lobe (2/44). Largest heterogeneous lesion is seen in segment 3 of the liver, measuring 3.2 x 3.3 cm (2/52), also stable. There may be additional smaller hypoattenuating lesions in the right hepatic lobe similar to prior exam. Stones are seen in a contracted gallbladder. Mild  intrahepatic biliary ductal dilatation, unchanged. Pancreas: Negative. Spleen: Negative. Adrenals/Urinary Tract: Adrenal glands are  unremarkable. Subcentimeter low-attenuation lesion in the right kidney is too small to characterize. Ureters are decompressed. There may be minimal bladder wall thickening. Stomach/Bowel: Stomach, small bowel and colon are unremarkable. Appendix is not well-visualized. Vascular/Lymphatic: Atherosclerotic calcification of the aorta without aneurysm. Filter is seen in the inferior vena cava. No pathologically enlarged lymph nodes. Reproductive: Hysterectomy.  No adnexal mass. Other: No free fluid. Midline laxity ventral abdominal wall at the umbilicus. Mesenteries and peritoneum are otherwise unremarkable. Musculoskeletal: No worrisome lytic or sclerotic lesions S shaped scoliosis. IMPRESSION: 1. Pulmonary and hepatic metastatic disease appears stable from 07/06/2019. 2. Minimally enlarged left axillary lymph nodes are nonspecific. Question recent COVID-19 vaccination. Please correlate clinically. 3. No additional evidence of metastatic disease. 4. Cholelithiasis. 5. Aortic atherosclerosis (ICD10-I70.0). Coronary artery calcification. Electronically Signed   By: Lorin Picket M.D.   On: 11/06/2019 15:43      Procedures:  Upper endoscopy  Subjective: Patient is feeling better, no abdominal pain, no nausea or vomiting, no further melena or hematochezia,.   Discharge Exam: Vitals:   11/08/19 2155 11/09/19 0555  BP: (!) 131/56 (!) 153/80  Pulse: 88 83  Resp:  15  Temp: 99.3 F (37.4 C) 98.2 F (36.8 C)  SpO2: 92% 93%   Vitals:   11/08/19 0349 11/08/19 1314 11/08/19 2155 11/09/19 0555  BP: (!) 169/58 (!) 179/55 (!) 131/56 (!) 153/80  Pulse: (!) 58 64 88 83  Resp: (!) 22 14  15   Temp: 98.1 F (36.7 C) 98.6 F (37 C) 99.3 F (37.4 C) 98.2 F (36.8 C)  TempSrc: Oral Oral Oral Oral  SpO2: 96% 97% 92% 93%  Weight:      Height:        General: Not in pain or dyspnea.  Neurology: Awake and alert, non focal  E ENT: mil;d pallor, no icterus, oral mucosa moist Cardiovascular: No JVD. S1-S2  present, rhythmic, no gallops, rubs, or murmurs. No lower extremity edema. Pulmonary: positive breath sounds bilaterally, adequate air movement, no wheezing, rhonchi or rales. Gastrointestinal. Abdomen with, no organomegaly, non tender, no rebound or guarding Skin. No rashes Musculoskeletal: no joint deformities   The results of significant diagnostics from this hospitalization (including imaging, microbiology, ancillary and laboratory) are listed below for reference.     Microbiology: Recent Results (from the past 240 hour(s))  SARS CORONAVIRUS 2 (TAT 6-24 HRS) Nasopharyngeal Nasopharyngeal Swab     Status: None   Collection Time: 11/06/19 12:59 PM   Specimen: Nasopharyngeal Swab  Result Value Ref Range Status   SARS Coronavirus 2 NEGATIVE NEGATIVE Final    Comment: (NOTE) SARS-CoV-2 target nucleic acids are NOT DETECTED. The SARS-CoV-2 RNA is generally detectable in upper and lower respiratory specimens during the acute phase of infection. Negative results do not preclude SARS-CoV-2 infection, do not rule out co-infections with other pathogens, and should not be used as the sole basis for treatment or other patient management decisions. Negative results must be combined with clinical observations, patient history, and epidemiological information. The expected result is Negative. Fact Sheet for Patients: SugarRoll.be Fact Sheet for Healthcare Providers: https://www.woods-mathews.com/ This test is not yet approved or cleared by the [place] FDA and  has been authorized for detection and/or diagnosis of SARS-CoV-2 by FDA under an Emergency Use Authorization (EUA). This EUA will remain  in effect (meaning this test can be used) for the duration of the COVID-19 declaration under Section 56  4(b)(1) of the Act, 21 U.S.C. section 360bbb-3(b)(1), unless the authorization is terminated or revoked sooner. Performed at [facility],  [place] [address], [place], [place] [address]      Labs: BNP (last 3 results) No results for input(s): BNP in the last 8760 hours. Basic Metabolic Panel: Recent Labs  Lab 11/06/19 1024 11/07/19 0409 11/08/19 0440  NA 136 137 136  K 4.6 4.5 4.1  CL 106 106 107  CO2 25 22 20*  GLUCOSE 114* 115* 121*  BUN 14 11 12   CREATININE 0.83 0.79 0.84  CALCIUM 8.6* 8.6* 8.5*   Liver Function Tests: Recent Labs  Lab 11/06/19 1024  AST 21  ALT 8  ALKPHOS 115  BILITOT 0.4  PROT 5.6*  ALBUMIN 3.0*   No results for input(s): LIPASE, AMYLASE in the last 168 hours. No results for input(s): AMMONIA in the last 168 hours. CBC: Recent Labs  Lab 11/06/19 1024 11/06/19 1024 11/07/19 0409 11/07/19 1307 11/07/19 2050 11/08/19 0440 11/09/19 0418  WBC 2.6*  --  2.5*  --   --  3.0* 3.3*  NEUTROABS 1.6*  --   --   --   --  1.9 2.0  HGB 6.8*  --  6.4* 8.6* 9.9* 9.8* 9.3*  HCT 22.6*   < > 21.8* 26.7* 29.7* 29.4* 29.1*  MCV 108.1*  --  110.1*  --   --  96.7 98.0  PLT 77*  --  76*  --   --  78* 64*   < > = values in this interval not displayed.   Cardiac Enzymes: No results for input(s): CKTOTAL, CKMB, CKMBINDEX, TROPONINI in the last 168 hours. BNP: Invalid input(s): POCBNP CBG: No results for input(s): GLUCAP in the last 168 hours. D-Dimer No results for input(s): DDIMER in the last 72 hours. Hgb A1c No results for input(s): HGBA1C in the last 72 hours. Lipid Profile No results for input(s): CHOL, HDL, LDLCALC, TRIG, CHOLHDL, LDLDIRECT in the last 72 hours. Thyroid function studies No results for input(s): TSH, T4TOTAL, T3FREE, THYROIDAB in the last 72 hours.  Invalid input(s): FREET3 Anemia work up No results for input(s): VITAMINB12, FOLATE, FERRITIN, TIBC, IRON, RETICCTPCT in the last 72 hours. Urinalysis    Component Value Date/Time   COLORURINE YELLOW 03/24/2018 1310   APPEARANCEUR Cloudy (A) 04/14/2019 1555   LABSPEC 1.009 03/24/2018 1310   PHURINE 6.0 03/24/2018 1310    GLUCOSEU Negative 04/14/2019 1555   HGBUR LARGE (A) 03/24/2018 1310   BILIRUBINUR Negative 04/14/2019 1555   KETONESUR NEGATIVE 03/24/2018 1310   PROTEINUR Negative 04/14/2019 1555   PROTEINUR NEGATIVE 03/24/2018 1310   UROBILINOGEN negative 05/24/2015 1434   NITRITE Positive (A) 04/14/2019 1555   NITRITE NEGATIVE 03/24/2018 1310   LEUKOCYTESUR 1+ (A) 04/14/2019 1555   Sepsis Labs Invalid input(s): PROCALCITONIN,  WBC,  LACTICIDVEN Microbiology Recent Results (from the past 240 hour(s))  SARS CORONAVIRUS 2 (TAT 6-24 HRS) Nasopharyngeal Nasopharyngeal Swab     Status: None   Collection Time: 11/06/19 12:59 PM   Specimen: Nasopharyngeal Swab  Result Value Ref Range Status   SARS Coronavirus 2 NEGATIVE NEGATIVE Final    Comment: (NOTE) SARS-CoV-2 target nucleic acids are NOT DETECTED. The SARS-CoV-2 RNA is generally detectable in upper and lower respiratory specimens during the acute phase of infection. Negative results do not preclude SARS-CoV-2 infection, do not rule out co-infections with other pathogens, and should not be used as the sole basis for treatment or other patient management decisions. Negative results  must be combined with clinical observations, patient history, and epidemiological information. The expected result is Negative. Fact Sheet for Patients: SugarRoll.be Fact Sheet for Healthcare Providers: https://www.woods-mathews.com/ This test is not yet approved or cleared by the [place] FDA and  has been authorized for detection and/or diagnosis of SARS-CoV-2 by FDA under an Emergency Use Authorization (EUA). This EUA will remain  in effect (meaning this test can be used) for the duration of the COVID-19 declaration under Section 56 4(b)(1) of the Act, 21 U.S.C. section 360bbb-3(b)(1), unless the authorization is terminated or revoked sooner. Performed at [facility], [place] [address] [place],  [place] [address]      Time coordinating discharge: 45 minutes  SIGNED:   Tawni Millers, MD  [facility] 11/09/2019, 12:01 PM

## 2019-11-09 NOTE — Progress Notes (Signed)
Patient's foley catheter removed at 1330.  Patient voided 150 ml of yellow urine.  Bladder scan after showed 0 ml PVR.

## 2019-11-09 NOTE — Care Management Important Message (Signed)
Important Message  Patient Details IM Letter given to Evette Cristal SW Case Manager to present to the Patient Name: [name] MRN: [id number] Date of Birth: July 09, 1934   Medicare Important Message Given:  Yes     Kerin Salen 11/09/2019, 11:59 AM

## 2019-11-10 ENCOUNTER — Other Ambulatory Visit: Payer: Self-pay

## 2019-11-10 ENCOUNTER — Encounter: Payer: Self-pay | Admitting: Internal Medicine

## 2019-11-10 ENCOUNTER — Inpatient Hospital Stay (HOSPITAL_BASED_OUTPATIENT_CLINIC_OR_DEPARTMENT_OTHER): Payer: Medicare Other | Admitting: Internal Medicine

## 2019-11-10 ENCOUNTER — Telehealth: Payer: Self-pay | Admitting: *Deleted

## 2019-11-10 VITALS — BP 132/64 | HR 78 | Temp 98.7°F | Resp 18 | Ht 64.0 in | Wt 210.2 lb

## 2019-11-10 DIAGNOSIS — I480 Paroxysmal atrial fibrillation: Secondary | ICD-10-CM | POA: Diagnosis not present

## 2019-11-10 DIAGNOSIS — D638 Anemia in other chronic diseases classified elsewhere: Secondary | ICD-10-CM | POA: Diagnosis not present

## 2019-11-10 DIAGNOSIS — G40909 Epilepsy, unspecified, not intractable, without status epilepticus: Secondary | ICD-10-CM | POA: Diagnosis not present

## 2019-11-10 DIAGNOSIS — D509 Iron deficiency anemia, unspecified: Secondary | ICD-10-CM | POA: Diagnosis not present

## 2019-11-10 DIAGNOSIS — D62 Acute posthemorrhagic anemia: Secondary | ICD-10-CM | POA: Diagnosis not present

## 2019-11-10 DIAGNOSIS — C7B8 Other secondary neuroendocrine tumors: Secondary | ICD-10-CM | POA: Diagnosis not present

## 2019-11-10 DIAGNOSIS — C787 Secondary malignant neoplasm of liver and intrahepatic bile duct: Secondary | ICD-10-CM

## 2019-11-10 DIAGNOSIS — C7B02 Secondary carcinoid tumors of liver: Secondary | ICD-10-CM | POA: Diagnosis not present

## 2019-11-10 DIAGNOSIS — Z90710 Acquired absence of both cervix and uterus: Secondary | ICD-10-CM | POA: Diagnosis not present

## 2019-11-10 DIAGNOSIS — C3492 Malignant neoplasm of unspecified part of left bronchus or lung: Secondary | ICD-10-CM | POA: Diagnosis not present

## 2019-11-10 DIAGNOSIS — E669 Obesity, unspecified: Secondary | ICD-10-CM | POA: Diagnosis not present

## 2019-11-10 DIAGNOSIS — Z5111 Encounter for antineoplastic chemotherapy: Secondary | ICD-10-CM | POA: Diagnosis not present

## 2019-11-10 DIAGNOSIS — Z86711 Personal history of pulmonary embolism: Secondary | ICD-10-CM | POA: Diagnosis not present

## 2019-11-10 DIAGNOSIS — E039 Hypothyroidism, unspecified: Secondary | ICD-10-CM | POA: Diagnosis not present

## 2019-11-10 DIAGNOSIS — F419 Anxiety disorder, unspecified: Secondary | ICD-10-CM | POA: Diagnosis not present

## 2019-11-10 DIAGNOSIS — F329 Major depressive disorder, single episode, unspecified: Secondary | ICD-10-CM | POA: Diagnosis not present

## 2019-11-10 DIAGNOSIS — F0390 Unspecified dementia without behavioral disturbance: Secondary | ICD-10-CM | POA: Diagnosis not present

## 2019-11-10 DIAGNOSIS — C7A8 Other malignant neuroendocrine tumors: Secondary | ICD-10-CM

## 2019-11-10 DIAGNOSIS — K219 Gastro-esophageal reflux disease without esophagitis: Secondary | ICD-10-CM | POA: Diagnosis not present

## 2019-11-10 DIAGNOSIS — Z79899 Other long term (current) drug therapy: Secondary | ICD-10-CM | POA: Diagnosis not present

## 2019-11-10 DIAGNOSIS — E785 Hyperlipidemia, unspecified: Secondary | ICD-10-CM | POA: Diagnosis not present

## 2019-11-10 DIAGNOSIS — C7A090 Malignant carcinoid tumor of the bronchus and lung: Secondary | ICD-10-CM | POA: Diagnosis not present

## 2019-11-10 DIAGNOSIS — Z7901 Long term (current) use of anticoagulants: Secondary | ICD-10-CM | POA: Diagnosis not present

## 2019-11-10 DIAGNOSIS — Z8673 Personal history of transient ischemic attack (TIA), and cerebral infarction without residual deficits: Secondary | ICD-10-CM | POA: Diagnosis not present

## 2019-11-10 DIAGNOSIS — E7800 Pure hypercholesterolemia, unspecified: Secondary | ICD-10-CM | POA: Diagnosis not present

## 2019-11-10 DIAGNOSIS — D696 Thrombocytopenia, unspecified: Secondary | ICD-10-CM | POA: Diagnosis not present

## 2019-11-10 DIAGNOSIS — K589 Irritable bowel syndrome without diarrhea: Secondary | ICD-10-CM | POA: Diagnosis not present

## 2019-11-10 LAB — SURGICAL PATHOLOGY

## 2019-11-10 NOTE — Progress Notes (Signed)
[place] [phone]   Fax:([phone] [place], [place] [phone]  DIAGNOSIS:  1) Metastatic intermediate. Neuroendocrine tumor of lung primary diagnosed in January 2018 and presented with small bilateral pulmonary nodules in addition to multiple liver metastasis. 2) right lower lobe pulmonary embolism diagnosed incidentally on CT scan of the chest on 04/23/2017  PRIOR THERAPY:  1) Status post radio embolization with Y 90 to the liver lesions by interventional radiology. 2) status post IVC filter placement by interventional radiology on 04/24/2017  CURRENT THERAPY: Xeloda 750 MG/M2 twice a day days 1-14 and Temodar 150 MG/M2 days 10-14 every 4 weeks. Status post 43 cycles.  Her treatment will be on hold for the next few months because of intolerance.  INTERVAL HISTORY: [name] 86 y.o. female returns to the clinic today for follow-up visit accompanied by her husband.  The patient is feeling more tired and fatigued than before.  She also had a lot of blood loss recently.  She underwent upper endoscopy by [facility] and that showed four gastric polyps with oozing that were removed.  There was also five angiodysplastic lesions treated with argon plasma coagulation.  She is currently holding her treatment with Xarelto.  The patient denied having any current chest pain but has shortness of breath with exertion with no cough or hemoptysis.  She denied having any fever or chills.  She has no current nausea, vomiting, diarrhea or constipation.  She had repeat CT scan of the chest, abdomen pelvis performed recently and she is here for evaluation and discussion of her scan results.   MEDICAL HISTORY: Past Medical History:  Diagnosis Date  . Acute respiratory failure with hypoxia ([place]) 10/23/2015  . Allergic rhinitis    PT. DENIES  . Anxiety   . Aortic insufficiency    Echo 04/29/2018: EF 65-70, mild AS  (mean 13), mod AI, Asc Aorta 42 mm (mildly dilated), mild LAE, PASP 41, pericardium normal in appearance.   . Arthritis    NECK  . Ataxia   . Back pain 12/13/2016  . Bradycardia    primarily nocturnal  . Burning tongue syndrome 25 years  . Cancer ([place]) dx'd 06/2016   liver  . Cataract   . Cerebellar degeneration ([place])   . Chest pain 12/13/2016   Atypical chest pain  . Chronic urinary tract infection   . Complication of anesthesia    low o2 sats, coded 30 years ago  . CVA (cerebral infarction) 05/2003  . Dehydration with hyponatremia 03/23/2018  . Depression   . Encounter for antineoplastic chemotherapy 10/09/2016  . Fatigue 01/03/2015  . Gait disorder   . Gastric polyps   . GERD (gastroesophageal reflux disease)   . Goals of care, counseling/discussion 10/09/2016  . H/O: CVA (cerebrovascular accident) 07/14/2016  . High cholesterol   . History of pericarditis   . Hyperlipidemia   . Hypotension   . Hypothyroidism   . IBS (irritable bowel syndrome)   . Neuroendocrine cancer ([place]) 08/23/2016  . Obesity   . Paroxysmal atrial fibrillation ([name])    chads2vasc score is 6,  she is felt to be a poor candidate for anticoagulation  . Pericarditis   . Personal history of arterial venous malformation (AVM)    right side of face  . Seizure disorder ([place])   . Seizures ([place]) 2003   " smelling"- Gabapentin "no problem"  . Shortness of breath dyspnea  with exertion  . Sick sinus syndrome ([place])   . Sternum fx 10/27/2013  . Stroke [facility]) 5 years ago   Right side of face weak, slurred speach-   . Thyroid disease   . TIA (transient ischemic attack)   . UTI (lower urinary tract infection) 03/27/2016   "frequently"    ALLERGIES:  is allergic to levaquin [levofloxacin] and lisinopril.  MEDICATIONS:  Current Outpatient Medications  Medication Sig Dispense Refill  . amLODipine (NORVASC) 2.5 MG tablet TAKE 1 TABLET DAILY (Patient taking differently: Take 2.5 mg by mouth daily. ) 90 tablet 0   . capecitabine (XELODA) 500 MG tablet TAKE 3 TABLETS (1500MG ) BY MOUTH TWICE DAILY AFTER A MEAL. TAKE ON DAYS 1-14 OF EACH 28 DAY CYCLE 84 tablet 2  . escitalopram (LEXAPRO) 20 MG tablet TAKE 1 TABLET AT BEDTIME (Patient taking differently: Take 20 mg by mouth at bedtime. ) 90 tablet 3  . fluticasone (FLONASE) 50 MCG/ACT nasal spray Place 2 sprays into both nostrils at bedtime. 48 g 3  . furosemide (LASIX) 20 MG tablet TAKE 1 TABLET DAILY AS NEEDED FOR FLUID RETENTION (Patient taking differently: Take 20 mg by mouth daily as needed for fluid. ) 90 tablet 3  . gabapentin (NEURONTIN) 400 MG capsule TAKE 1 TO 2 CAPSULES THREE TIMES A DAY (Patient taking differently: Take 400-800 mg by mouth 3 (three) times daily. ) 720 capsule 1  . hydroxypropyl methylcellulose / hypromellose (ISOPTO TEARS / GONIOVISC) 2.5 % ophthalmic solution Place 1 drop into both eyes daily.     Marland Kitchen levothyroxine (SYNTHROID) 75 MCG tablet Take 1 tablet (75 mcg total) by mouth daily before breakfast. 90 tablet 1  . lovastatin (MEVACOR) 40 MG tablet TAKE 1 TABLET AT BEDTIME (Patient taking differently: Take 40 mg by mouth at bedtime. ) 90 tablet 3  . Melatonin 3 MG TBDP Take 3-6 mg by mouth at bedtime as needed. (Patient taking differently: Take 3-6 mg by mouth at bedtime as needed (sleep). ) 60 tablet 1  . ondansetron (ZOFRAN) 8 MG tablet Take 1 tablet (8 mg total) by mouth every 8 (eight) hours as needed for nausea or vomiting. 90 tablet 1  . OXcarbazepine (TRILEPTAL) 150 MG tablet Take 1 tablet (150 mg total) by mouth 2 (two) times daily. 180 tablet 3  . pantoprazole (PROTONIX) 40 MG tablet Take 1 tablet (40 mg total) by mouth 2 (two) times daily. 60 tablet 0  . QUEtiapine (SEROQUEL) 25 MG tablet Take 1 tablet (25 mg total) by mouth at bedtime. 90 tablet 1  . rivaroxaban (XARELTO) 20 MG TABS tablet TAKE 1 TABLET DAILY WITH SUPPER (Patient taking differently: Take 20 mg by mouth daily with supper. ) 90 tablet 3  . silver sulfADIAZINE  (SILVADENE) 1 % cream Apply 1 application topically daily. 400 g 0  . temozolomide (TEMODAR) 140 MG capsule TAKE 2 CAPSULES BY MOUTH DAILY FOR 5 DAYS,ON DAYS 10-14 OF EACH 28DAY CYCLE. MAY TAKE ON EMPTY STOMACH AT BEDTIME TO DECREASE NAUSEA & VOMITING 10 capsule 2   No current facility-administered medications for this visit.   Facility-Administered Medications Ordered in Other Visits  Medication Dose Route Frequency Provider Last Rate Last Admin  . heparin lock flush 100 unit/mL  500 Units Intracatheter Daily PRN Heilingoetter, Cassandra L, PA-C        SURGICAL HISTORY:  Past Surgical History:  Procedure Laterality Date  . APPENDECTOMY  86 years old  . COLONOSCOPY  2006, 2009  . COLONOSCOPY WITH PROPOFOL N/A  03/28/2016   Procedure: COLONOSCOPY WITH PROPOFOL;  Surgeon: Gatha Mayer, MD;  Location: [facility];  Service: Endoscopy;  Laterality: N/A;  . CORONARY ANGIOGRAPHY N/A 05/22/2018   Procedure: CORONARY ANGIOGRAPHY (CATH LAB);  Surgeon: Lorretta Harp, MD;  Location: [facility];  Service: Cardiovascular;  Laterality: N/A;  . cyst removed  35 years ago  . EP IMPLANTABLE DEVICE N/A 01/06/2015   Procedure: Loop Recorder Insertion;  Surgeon: Thompson Grayer, MD;  Location: [facility] CV [facility];  Service: Cardiovascular;  Laterality: N/A;  . ESOPHAGOGASTRODUODENOSCOPY (EGD) WITH PROPOFOL N/A 11/07/2019   Procedure: ESOPHAGOGASTRODUODENOSCOPY (EGD) WITH PROPOFOL;  Surgeon: Yetta Flock, MD;  [facility];  Service: Gastroenterology;  Laterality: N/A;  . EYE SURGERY Right    Cataract  . HEMOSTASIS CLIP PLACEMENT  11/07/2019   Procedure: HEMOSTASIS CLIP PLACEMENT;  Surgeon: Yetta Flock, MD;  [facility];  Service: Gastroenterology;;  . HEMOSTASIS CONTROL  11/07/2019   Procedure: HEMOSTASIS CONTROL;  Surgeon: Yetta Flock, MD;  [facility];  Service: Gastroenterology;;  . Chrystine Oiler SELECTIVE EACH ADDITIONAL VESSEL  11/28/2016  .  IR ANGIOGRAM SELECTIVE EACH ADDITIONAL VESSEL  11/28/2016  . IR ANGIOGRAM SELECTIVE EACH ADDITIONAL VESSEL  11/28/2016  . IR ANGIOGRAM SELECTIVE EACH ADDITIONAL VESSEL  11/28/2016  . IR ANGIOGRAM SELECTIVE EACH ADDITIONAL VESSEL  12/13/2016  . IR ANGIOGRAM SELECTIVE EACH ADDITIONAL VESSEL  12/13/2016  . IR ANGIOGRAM SELECTIVE EACH ADDITIONAL VESSEL  01/09/2017  . IR ANGIOGRAM VISCERAL SELECTIVE  11/28/2016  . IR ANGIOGRAM VISCERAL SELECTIVE  11/28/2016  . IR ANGIOGRAM VISCERAL SELECTIVE  12/13/2016  . IR ANGIOGRAM VISCERAL SELECTIVE  12/13/2016  . IR ANGIOGRAM VISCERAL SELECTIVE  01/09/2017  . IR EMBO ARTERIAL NOT HEMORR HEMANG INC GUIDE ROADMAPPING  11/28/2016  . IR EMBO TUMOR ORGAN ISCHEMIA INFARCT INC GUIDE ROADMAPPING  12/13/2016  . IR EMBO TUMOR ORGAN ISCHEMIA INFARCT INC GUIDE ROADMAPPING  01/09/2017  . IR IVC FILTER PLMT / S&I /IMG GUID/MOD SED  04/24/2017  . IR RADIOLOGIST EVAL & MGMT  11/06/2016  . IR RADIOLOGIST EVAL & MGMT  01/02/2017  . IR RADIOLOGIST EVAL & MGMT  02/05/2017  . IR RADIOLOGIST EVAL & MGMT  05/02/2017  . IR RADIOLOGIST EVAL & MGMT  10/17/2017  . IR US GUIDE VASC ACCESS RIGHT  11/28/2016  . IR US GUIDE VASC ACCESS RIGHT  12/13/2016  . IR US GUIDE VASC ACCESS RIGHT  01/09/2017  . KNEE ARTHROSCOPY Right 11/14/2006  . KNEE ARTHROSCOPY Bilateral 5 and 6 years ago  . KNEE ARTHROSCOPY WITH LATERAL MENISECTOMY  07/03/2012   Procedure: KNEE ARTHROSCOPY WITH LATERAL MENISECTOMY;  Surgeon: Magnus Sinning, MD;  [facility];  Service: Orthopedics;  Laterality: Left;  with Partial Lateral Menisectomy and Medial Menisectomy. Shaving of medial and lateral femoral condyles. Shaving of patella. Removal of a loose body  . LOOP RECORDER REMOVAL N/A 05/22/2018   Procedure: LOOP RECORDER REMOVAL;  Surgeon: Thompson Grayer, MD;  Location: [facility] CV [facility];  Service: Cardiovascular;  Laterality: N/A;  . PACEMAKER IMPLANT N/A 05/22/2018   MDT Azure XT DR MRI implanted by Dr Rayann Heman for sick sinus syndrome  .  POLYPECTOMY  11/07/2019   Procedure: POLYPECTOMY;  Surgeon: Yetta Flock, MD;  [facility];  Service: Gastroenterology;;  . tibial and fibular internal fixation Left   . TOTAL ABDOMINAL HYSTERECTOMY  86 years old  . UPPER GASTROINTESTINAL ENDOSCOPY  2009, 2013    REVIEW OF SYSTEMS:  Constitutional:  positive for fatigue Eyes: negative Ears, nose, mouth, throat, and face: negative Respiratory: positive for dyspnea on exertion Cardiovascular: negative Gastrointestinal: negative Genitourinary:negative Integument/breast: negative Hematologic/lymphatic: negative Musculoskeletal:positive for arthralgias and muscle weakness Neurological: negative Behavioral/Psych: negative Endocrine: negative Allergic/Immunologic: negative   PHYSICAL EXAMINATION: General appearance: alert, cooperative, fatigued and no distress Head: Normocephalic, without obvious abnormality, atraumatic Neck: no adenopathy, no JVD, supple, symmetrical, trachea midline and thyroid not enlarged, symmetric, no tenderness/mass/nodules Lymph nodes: Cervical, supraclavicular, and axillary nodes normal. Resp: clear to auscultation bilaterally Back: symmetric, no curvature. ROM normal. No CVA tenderness. Cardio: regular rate and rhythm, S1, S2 normal, no murmur, click, rub or gallop GI: soft, non-tender; bowel sounds normal; no masses,  no organomegaly Extremities: extremities normal, atraumatic, no cyanosis or edema Neurologic: Alert and oriented X 3, normal strength and tone. Normal symmetric reflexes. Normal coordination and gait  ECOG PERFORMANCE STATUS: 2 - Symptomatic, <50% confined to bed  Blood pressure 132/64, pulse 78, temperature 98.7 F (37.1 C), temperature source Temporal, resp. rate 18, height 5\' 4"  (1.626 m), weight 210 lb 3.2 oz (95.3 kg), SpO2 97 %.  LABORATORY DATA: Lab Results  Component Value Date   WBC 3.3 (L) 11/09/2019   HGB 9.3 (L) 11/09/2019   HCT 29.1 (L) 11/09/2019   MCV 98.0  11/09/2019   PLT 64 (L) 11/09/2019      Chemistry      Component Value Date/Time   NA 136 11/08/2019 0440   NA 140 10/10/2018 1548   NA 135 (L) 07/10/2017 1142   K 4.1 11/08/2019 0440   K 4.6 07/10/2017 1142   CL 107 11/08/2019 0440   CO2 20 (L) 11/08/2019 0440   CO2 25 07/10/2017 1142   BUN 12 11/08/2019 0440   BUN 14 10/10/2018 1548   BUN 11.4 07/10/2017 1142   CREATININE 0.84 11/08/2019 0440   CREATININE 0.83 11/06/2019 1024   CREATININE 1.0 07/10/2017 1142      Component Value Date/Time   CALCIUM 8.5 (L) 11/08/2019 0440   CALCIUM 9.9 07/10/2017 1142   ALKPHOS 115 11/06/2019 1024   ALKPHOS 93 07/10/2017 1142   AST 21 11/06/2019 1024   AST 43 (H) 07/10/2017 1142   ALT 8 11/06/2019 1024   ALT 28 07/10/2017 1142   BILITOT 0.4 11/06/2019 1024   BILITOT 0.56 07/10/2017 1142       RADIOGRAPHIC STUDIES: CT Chest W Contrast  Result Date: 11/06/2019 CLINICAL DATA:  Gastrointestinal cancer staging. EXAM: CT CHEST, ABDOMEN, AND PELVIS WITH CONTRAST TECHNIQUE: Multidetector CT imaging of the chest, abdomen and pelvis was performed following the standard protocol during bolus administration of intravenous contrast. CONTRAST:  134mL OMNIPAQUE IOHEXOL 300 MG/ML  SOLN COMPARISON:  07/06/2019. FINDINGS: CT CHEST FINDINGS Cardiovascular: Atherosclerotic calcification of the aorta, aortic valve and coronary arteries. Heart is enlarged. No pericardial effusion. Mediastinum/Nodes: No pathologically enlarged mediastinal or hilar lymph nodes. Left axillary lymph nodes measure up to 10 mm (2/20), increased from 6 mm. No right axillary adenopathy. Esophagus is unremarkable. Lungs/Pleura: Image quality is somewhat degraded by respiratory motion. There are numerous hematogenously distributed tiny bilateral pulmonary nodules, measuring 5 mm or less in size, as before. Focal volume loss along the anterior margin of the minor fissure (6/49), stable. No pleural fluid. Airway is unremarkable.  Musculoskeletal: Degenerative changes in the spine. No worrisome lytic or sclerotic lesions. Old sternal fracture. CT ABDOMEN PELVIS FINDINGS Hepatobiliary: A vague area mildly decreased attenuation in the lateral dome of the liver (2/39) is unchanged, as is a  2.1 cm low-attenuation lesion left hepatic lobe (2/44). Largest heterogeneous lesion is seen in segment 3 of the liver, measuring 3.2 x 3.3 cm (2/52), also stable. There may be additional smaller hypoattenuating lesions in the right hepatic lobe similar to prior exam. Stones are seen in a contracted gallbladder. Mild intrahepatic biliary ductal dilatation, unchanged. Pancreas: Negative. Spleen: Negative. Adrenals/Urinary Tract: Adrenal glands are unremarkable. Subcentimeter low-attenuation lesion in the right kidney is too small to characterize. Ureters are decompressed. There may be minimal bladder wall thickening. Stomach/Bowel: Stomach, small bowel and colon are unremarkable. Appendix is not well-visualized. Vascular/Lymphatic: Atherosclerotic calcification of the aorta without aneurysm. Filter is seen in the inferior vena cava. No pathologically enlarged lymph nodes. Reproductive: Hysterectomy.  No adnexal mass. Other: No free fluid. Midline laxity ventral abdominal wall at the umbilicus. Mesenteries and peritoneum are otherwise unremarkable. Musculoskeletal: No worrisome lytic or sclerotic lesions S shaped scoliosis. IMPRESSION: 1. Pulmonary and hepatic metastatic disease appears stable from 07/06/2019. 2. Minimally enlarged left axillary lymph nodes are nonspecific. Question recent COVID-19 vaccination. Please correlate clinically. 3. No additional evidence of metastatic disease. 4. Cholelithiasis. 5. Aortic atherosclerosis (ICD10-I70.0). Coronary artery calcification. Electronically Signed   By: Lorin Picket M.D.   On: 11/06/2019 15:43   CT Abdomen Pelvis W Contrast  Result Date: 11/06/2019 CLINICAL DATA:  Gastrointestinal cancer staging. EXAM: CT  CHEST, ABDOMEN, AND PELVIS WITH CONTRAST TECHNIQUE: Multidetector CT imaging of the chest, abdomen and pelvis was performed following the standard protocol during bolus administration of intravenous contrast. CONTRAST:  110mL OMNIPAQUE IOHEXOL 300 MG/ML  SOLN COMPARISON:  07/06/2019. FINDINGS: CT CHEST FINDINGS Cardiovascular: Atherosclerotic calcification of the aorta, aortic valve and coronary arteries. Heart is enlarged. No pericardial effusion. Mediastinum/Nodes: No pathologically enlarged mediastinal or hilar lymph nodes. Left axillary lymph nodes measure up to 10 mm (2/20), increased from 6 mm. No right axillary adenopathy. Esophagus is unremarkable. Lungs/Pleura: Image quality is somewhat degraded by respiratory motion. There are numerous hematogenously distributed tiny bilateral pulmonary nodules, measuring 5 mm or less in size, as before. Focal volume loss along the anterior margin of the minor fissure (6/49), stable. No pleural fluid. Airway is unremarkable. Musculoskeletal: Degenerative changes in the spine. No worrisome lytic or sclerotic lesions. Old sternal fracture. CT ABDOMEN PELVIS FINDINGS Hepatobiliary: A vague area mildly decreased attenuation in the lateral dome of the liver (2/39) is unchanged, as is a 2.1 cm low-attenuation lesion left hepatic lobe (2/44). Largest heterogeneous lesion is seen in segment 3 of the liver, measuring 3.2 x 3.3 cm (2/52), also stable. There may be additional smaller hypoattenuating lesions in the right hepatic lobe similar to prior exam. Stones are seen in a contracted gallbladder. Mild intrahepatic biliary ductal dilatation, unchanged. Pancreas: Negative. Spleen: Negative. Adrenals/Urinary Tract: Adrenal glands are unremarkable. Subcentimeter low-attenuation lesion in the right kidney is too small to characterize. Ureters are decompressed. There may be minimal bladder wall thickening. Stomach/Bowel: Stomach, small bowel and colon are unremarkable. Appendix is not  well-visualized. Vascular/Lymphatic: Atherosclerotic calcification of the aorta without aneurysm. Filter is seen in the inferior vena cava. No pathologically enlarged lymph nodes. Reproductive: Hysterectomy.  No adnexal mass. Other: No free fluid. Midline laxity ventral abdominal wall at the umbilicus. Mesenteries and peritoneum are otherwise unremarkable. Musculoskeletal: No worrisome lytic or sclerotic lesions S shaped scoliosis. IMPRESSION: 1. Pulmonary and hepatic metastatic disease appears stable from 07/06/2019. 2. Minimally enlarged left axillary lymph nodes are nonspecific. Question recent COVID-19 vaccination. Please correlate clinically. 3. No additional evidence of metastatic disease. 4. Cholelithiasis.  5. Aortic atherosclerosis (ICD10-I70.0). Coronary artery calcification. Electronically Signed   By: Lorin Picket M.D.   On: 11/06/2019 15:43    ASSESSMENT AND PLAN:  This is a very pleasant 86  years old white female with metastatic intermediate grade neuroendocrine carcinoma of questionable lung primary and multiple metastatic liver lesions and pancreatic lesions.She status post treatment with radio embolization with Y90 to the left and right lobe liver lesions.Status post treatment with Y 90 to the left and right lobes of the liver. She is currently undergoing systemic chemotherapy with Xeloda and Temodar status post 41 cycles.   She has been tolerating this treatment well.  She is expected to start cycle #42 on February 21 , 2021. The patient has been tolerating the treatment well except for the increasing fatigue and weakness secondary to anemia likely chemotherapy-induced versus iron deficiency anemia secondary to gastrointestinal blood loss.  She is currently off treatment with Xarelto. She had repeat CT scan of the chest, abdomen pelvis performed recently.  I personally and independently reviewed the scans and discussed the results with the patient and her husband. Her scan showed a  stable disease in the lung and liver.  I recommended for the patient to take a break of the chemotherapy for the next few months.  She has been on this treatment for almost 4 years with no interruption.  She was very happy to take a break of treatment to gain some of her strength back. For the persistent iron deficiency anemia, she will continue on the oral iron tablets. For the dementia and confusion, she is followed by neurology. The patient was advised to call immediately if she has any concerning symptoms in the interval. The patient voices understanding of current disease status and treatment options and is in agreement with the current care plan. All questions were answered. The patient knows to call the clinic with any problems, questions or concerns. We can certainly see the patient much sooner if necessary.  Disclaimer: This note was dictated with voice recognition software. Similar sounding words can inadvertently be transcribed and may not be corrected upon review.

## 2019-11-10 NOTE — Telephone Encounter (Signed)
[facility] hospital followup apt

## 2019-11-10 NOTE — Telephone Encounter (Signed)
    Transitional Care Management  Contact Attempt Attempt Date:11/10/2019 Attempted By: Zannie Cove, LPN   2nd unsuccessful TCM contact attempt. LM on VM  I reached out to [name] on her preferred telephone number to discuss Transitional Care Management, medication reconciliation, and to schedule a TCM hospital follow-up with her PCP at [facility]  Discharge Date: 11/09/19 Location: [facility] Discharge Dx: GI hemorrhage  Recommendations for Outpatient Follow-up:   (insert from discharge summary) Recommendations for Outpatient Follow-up and new medication changes:  1. Follow up with Dr, [name] in 7 days.  2. Patient placed on pantoprazole 40 mg bid 3. Hold Rivaroxaban until 11/15/19.  4. Follow up with Dr. [name] as scheduled (Oncology)   Plan I left a HIPPA compliant message for her to return my call.  Will attempt to contact again within the 2 business day post discharge window if she does not return my call.

## 2019-11-10 NOTE — Telephone Encounter (Signed)
TRANSITIONAL CARE MANAGEMENT TELEPHONE OUTREACH NOTE   Contact Date: 11/10/2019 Contacted By: Zannie Cove, LPN   DISCHARGE INFORMATION Date of Discharge:11/09/19   Discharge Facility: [facility]  Principal Discharge Diagnosis: Gi Hemorrhage    Outpatient Follow Up Recommendations (copied from discharge summary) Recommendations for Outpatient Follow-up and new medication changes:  1. Follow up with Dr, [name] in 7 days.  2. Patient placed on pantoprazole 40 mg bid 3. Hold Rivaroxaban until 11/15/19.  4. Follow up with Dr. [name] as scheduled (Oncology)  [name] is a female primary care patient of [name], MD. An outgoing telephone call was made today and I spoke with Audry Pili, her husband.  Ms. Casco condition(s) and treatment(s) were discussed. An opportunity to ask questions was provided and all were answered or forwarded as appropriate.    ACTIVITIES OF DAILY LIVING  [name] lives with their spouse and she cannot perform ADLs independently. her primary caregiver is Audry Pili, her husband. she is able to depend on her primary caregiver(s) for consistent help. Transportation to appointments, to pick up medications, and to run errands is not a problem.  (Consider referral to [facility] if transportation or a consistent caregiver is a problem)   Fall Risk Fall Risk  09/30/2019 07/01/2019  Falls in the past year? 0 1  Number falls in past yr: 0 0  Injury with Fall? 0 1  Comment - Skin tear on lower leg.  Risk Factor Category  - -  Risk for fall due to : - History of fall(s);Impaired mobility  Follow up - Falls prevention discussed    high [place] Modifications/Assistive Devices Wheelchair: Yes Cane: No Ramp: Yes Bedside Toilet: Yes Hospital Bed:  Yes   [place] she is not receiving home health n/a services.     MEDICATION RECONCILIATION  Ms. Fini has been able to pick-up all prescribed discharge medications from the  pharmacy.   A post discharge medication reconciliation was performed and the complete medication list was reviewed with the patient/caregiver and is current as of 11/10/2019. Changes highlighted below.  Discontinued Medications STOP taking these medications   amitriptyline 25 MG tablet Commonly known as: ELAVIL   esomeprazole 40 MG capsule Commonly known as: NexIUM     Current Medication List Allergies as of 11/10/2019      Reactions   Levaquin [levofloxacin] Shortness Of Breath   Lisinopril Cough      Medication List       Accurate as of November 10, 2019  4:03 PM. If you have any questions, ask your nurse or doctor.        amLODipine 2.5 MG tablet Commonly known as: NORVASC TAKE 1 TABLET DAILY   capecitabine 500 MG tablet Commonly known as: XELODA TAKE 3 TABLETS (1500MG ) BY MOUTH TWICE DAILY AFTER A MEAL. TAKE ON DAYS 1-14 OF EACH 28 DAY CYCLE   escitalopram 20 MG tablet Commonly known as: LEXAPRO TAKE 1 TABLET AT BEDTIME   fluticasone 50 MCG/ACT nasal spray Commonly known as: FLONASE Place 2 sprays into both nostrils at bedtime.   furosemide 20 MG tablet Commonly known as: LASIX TAKE 1 TABLET DAILY AS NEEDED FOR FLUID RETENTION What changed: See the new instructions.   gabapentin 400 MG capsule Commonly known as: NEURONTIN TAKE 1 TO 2 CAPSULES THREE TIMES A DAY What changed:   how much to take  how to take this  when to take this  additional instructions  hydroxypropyl methylcellulose / hypromellose 2.5 % ophthalmic solution Commonly known as: ISOPTO TEARS / GONIOVISC Place 1 drop into both eyes daily.   levothyroxine 75 MCG tablet Commonly known as: SYNTHROID Take 1 tablet (75 mcg total) by mouth daily before breakfast.   lovastatin 40 MG tablet Commonly known as: MEVACOR TAKE 1 TABLET AT BEDTIME   Melatonin 3 MG Tbdp Take 3-6 mg by mouth at bedtime as needed. What changed: reasons to take this   ondansetron 8 MG tablet Commonly known  as: ZOFRAN Take 1 tablet (8 mg total) by mouth every 8 (eight) hours as needed for nausea or vomiting.   OXcarbazepine 150 MG tablet Commonly known as: TRILEPTAL Take 1 tablet (150 mg total) by mouth 2 (two) times daily.   pantoprazole 40 MG tablet Commonly known as: PROTONIX Take 1 tablet (40 mg total) by mouth 2 (two) times daily.   QUEtiapine 25 MG tablet Commonly known as: SEROquel Take 1 tablet (25 mg total) by mouth at bedtime.   rivaroxaban 20 MG Tabs tablet Commonly known as: Xarelto TAKE 1 TABLET DAILY WITH SUPPER What changed:   how much to take  how to take this  when to take this  additional instructions   silver sulfADIAZINE 1 % cream Commonly known as: Silvadene Apply 1 application topically daily.   temozolomide 140 MG capsule Commonly known as: TEMODAR TAKE 2 CAPSULES BY MOUTH DAILY FOR 5 DAYS,ON DAYS 10-14 OF EACH 28DAY CYCLE. MAY TAKE ON EMPTY STOMACH AT BEDTIME TO DECREASE NAUSEA & VOMITING        PATIENT EDUCATION & FOLLOW-UP PLAN  An appointment for Transitional Care Management is scheduled with [name], MD on 11/18/19 at 910am.  Take all medications as prescribed  Contact our office by calling [phone] if you have any questions or concerns

## 2019-11-10 NOTE — Telephone Encounter (Addendum)
    Transitional Care Management  Contact Attempt Attempt Date:11/10/2019 Attempted By: Zannie Cove, LPN  1st unsuccessful TCM contact attempt.  LM   I reached out to [name] on her preferred telephone number to discuss Transitional Care Management, medication reconciliation, and to schedule a TCM hospital follow-up with her PCP at [facility].  Discharge Date: 11/09/19  Location: [place]  Discharge Dx: GI Hemorrhage   Recommendations for Outpatient Follow-up:   (insert from discharge summary) Recommendations for Outpatient Follow-up and new medication changes:  1. Follow up with Dr, [name] in 7 days.  2. Patient placed on pantoprazole 40 mg bid 3. Hold Rivaroxaban until 11/15/19.  4. Follow up with Dr. [name] as scheduled (Oncology)   Plan I left a HIPPA compliant message for her to return my call.  Will attempt to contact again within the 2 business day post discharge window if she does not return my call.

## 2019-11-11 ENCOUNTER — Telehealth: Payer: Self-pay | Admitting: Internal Medicine

## 2019-11-11 NOTE — Telephone Encounter (Signed)
Scheduled per los. Called and left msg. Mailed printout  °

## 2019-11-18 ENCOUNTER — Ambulatory Visit (INDEPENDENT_AMBULATORY_CARE_PROVIDER_SITE_OTHER): Payer: Medicare Other | Admitting: Family Medicine

## 2019-11-18 ENCOUNTER — Encounter: Payer: Self-pay | Admitting: Family Medicine

## 2019-11-18 ENCOUNTER — Other Ambulatory Visit: Payer: Self-pay

## 2019-11-18 VITALS — BP 135/66 | HR 60 | Temp 97.8°F | Ht 64.0 in

## 2019-11-18 DIAGNOSIS — E782 Mixed hyperlipidemia: Secondary | ICD-10-CM

## 2019-11-18 DIAGNOSIS — C3492 Malignant neoplasm of unspecified part of left bronchus or lung: Secondary | ICD-10-CM | POA: Diagnosis not present

## 2019-11-18 DIAGNOSIS — K2961 Other gastritis with bleeding: Secondary | ICD-10-CM | POA: Diagnosis not present

## 2019-11-18 DIAGNOSIS — C7931 Secondary malignant neoplasm of brain: Secondary | ICD-10-CM

## 2019-11-18 DIAGNOSIS — I480 Paroxysmal atrial fibrillation: Secondary | ICD-10-CM | POA: Diagnosis not present

## 2019-11-18 DIAGNOSIS — I10 Essential (primary) hypertension: Secondary | ICD-10-CM | POA: Diagnosis not present

## 2019-11-18 DIAGNOSIS — K317 Polyp of stomach and duodenum: Secondary | ICD-10-CM

## 2019-11-18 MED ORDER — QUETIAPINE FUMARATE 100 MG PO TABS: 100.0000 mg | ORAL_TABLET | Freq: Every day | ORAL | 1 refills | Status: DC

## 2019-11-18 NOTE — Patient Instructions (Addendum)
In addition to your office visit in 6 weeks, you will need to drop into the office every 2 weeks for a complete blood count.  You may want to check with home health care to see if they can draw this for you.  Medical bed off

## 2019-11-18 NOTE — Addendum Note (Signed)
Addended by: [name] on: 11/18/2019 08:23 PM   Modules accepted: Orders

## 2019-11-18 NOTE — Progress Notes (Addendum)
Subjective:  Patient ID: [name], female    DOB: May 21, 1934  Age: 86 y.o. MRN: [id number]  CC: Transitions Of Care   HPI [name] presents for transition of care post hospitalization, From April 9-12.  Hospitalization history obtained from the record shows that he presented with melena and was found to have blood loss anemia.  She reported melanotic stools for about 2 months.  It had worsened during the last week before hospitalization.  She is found to be having an upper GI bleed due to polyps and AVM.  She underwent to unit transfusion of packed red blood cells.  Upper endoscopy showed 4 gastric polyps that were oozing blood and 5 angiodysplastic lesions as well.  These were treated endoscopically.  At time of discharge her hemoglobin was between 9.8 and 9.3 with a hematocrit of 29 and she was tolerating a normal diet.  She was placed on pantoprazole twice daily.  And taken off of her blood thinner rivaroxaban for a week.  Dr. Earlie Server determined that she would have to go back on it due to blood clots.  She has had pulmonary emboli repeatedly.  Cardiology is also concerned about her going off rivaroxaban due to the atrial fibrillation. Husband relates to me that she has taken alprazolam in the past on occasion.  The medicine calms her down when he is having what he describes as a panic attack she gets nervous and jittery all over and feels extremely anxious.  Patient also is dealing with a brain tumor she is under the care of Dr. Earlie Server.  He is aware of her increasing headaches.  Due to patient's weakness she cannot turn herself adequately to position herself appropriately even with a wedge pillow.  This is necessary to reduce pressure and prevent pressure sores from occurring.  Patient has generalized debility.  She is end-stage in her cancer treatment. Has chronic pulmonary disease that requires her bed to be elevated at the head greater than 30 degrees for adequate  respirations.  Depression screen [facility] 2/9 11/18/2019 07/01/2019 06/17/2019  Decreased Interest 3 0 0  Down, Depressed, Hopeless 3 0 0  PHQ - 2 Score 6 0 0  Altered sleeping 3 - -  Tired, decreased energy 3 - -  Change in appetite 0 - -  Feeling bad or failure about yourself  0 - -  Trouble concentrating 3 - -  Moving slowly or fidgety/restless 3 - -  Suicidal thoughts 0 - -  PHQ-9 Score 18 - -  Difficult doing work/chores Somewhat difficult - -  Some recent data might be hidden    History [name] has a past medical history of Acute respiratory failure with hypoxia ([place]) (10/23/2015), Allergic rhinitis, Anxiety, Aortic insufficiency, Arthritis, Ataxia, Back pain (12/13/2016), Bradycardia, Burning tongue syndrome (25 years), Cancer ([name]) (dx'd 06/2016), Cataract, Cerebellar degeneration ([place]), Chest pain (12/13/2016), Chronic urinary tract infection, Complication of anesthesia, CVA (cerebral infarction) (05/2003), Dehydration with hyponatremia (03/23/2018), Depression, Encounter for antineoplastic chemotherapy (10/09/2016), Fatigue (01/03/2015), Gait disorder, Gastric polyps, GERD (gastroesophageal reflux disease), Goals of care, counseling/discussion (10/09/2016), H/O: CVA (cerebrovascular accident) (07/14/2016), High cholesterol, History of pericarditis, Hyperlipidemia, Hypotension, Hypothyroidism, IBS (irritable bowel syndrome), Neuroendocrine cancer ([place]) (08/23/2016), Obesity, Paroxysmal atrial fibrillation ([place]), Pericarditis, Personal history of arterial venous malformation (AVM), Seizure disorder ([place]), Seizures ([place]) (2003), Shortness of breath dyspnea, Sick sinus syndrome ([place]), Sternum fx (10/27/2013), Stroke ([place]) (5 years ago), Thyroid disease, TIA (transient ischemic attack), and UTI (lower urinary tract infection) (03/27/2016).  She has a past surgical history that includes Knee arthroscopy (Right, 11/14/2006); tibial and fibular internal fixation (Left); Upper gastrointestinal endoscopy (2009,  2013); Colonoscopy (2006, 2009); Appendectomy (86 years old); cyst removed (35 years ago); Total abdominal hysterectomy (86 years old); Knee arthroscopy (Bilateral, 5 and 6 years ago); Knee arthroscopy with lateral menisectomy (07/03/2012); Cardiac catheterization (N/A, 01/06/2015); Eye surgery (Right); Colonoscopy with propofol (N/A, 03/28/2016); IR Angiogram Visceral Selective (11/28/2016); IR Angiogram Selective Each Additional Vessel (11/28/2016); IR US Guide Vasc Access Right (11/28/2016); IR Angiogram Selective Each Additional Vessel (11/28/2016); IR Angiogram Visceral Selective (11/28/2016); IR Angiogram Selective Each Additional Vessel (11/28/2016); IR EMBO ARTERIAL NOT HEMORR HEMANG INC GUIDE ROADMAPPING (11/28/2016); IR Angiogram Selective Each Additional Vessel (11/28/2016); IR Angiogram Selective Each Additional Vessel (12/13/2016); IR Angiogram Visceral Selective (12/13/2016); IR Angiogram Selective Each Additional Vessel (12/13/2016); IR US Guide Vasc Access Right (12/13/2016); IR Angiogram Visceral Selective (12/13/2016); IR EMBO TUMOR ORGAN ISCHEMIA INFARCT INC GUIDE ROADMAPPING (12/13/2016); IR Angiogram Selective Each Additional Vessel (01/09/2017); IR EMBO TUMOR ORGAN ISCHEMIA INFARCT INC GUIDE ROADMAPPING (01/09/2017); IR US Guide Vasc Access Right (01/09/2017); IR Angiogram Visceral Selective (01/09/2017); IR Radiologist Eval & Mgmt (11/06/2016); IR Radiologist Eval & Mgmt (01/02/2017); IR IVC FILTER PLMT / S&I /IMG GUID/MOD SED (04/24/2017); IR Radiologist Eval & Mgmt (02/05/2017); IR Radiologist Eval & Mgmt (05/02/2017); IR Radiologist Eval & Mgmt (10/17/2017); CORONARY ANGIOGRAPHY (N/A, 05/22/2018); PACEMAKER IMPLANT (N/A, 05/22/2018); LOOP RECORDER REMOVAL (N/A, 05/22/2018); Esophagogastroduodenoscopy (egd) with propofol (N/A, 11/07/2019); Hemostasis control (11/07/2019); Hemostasis clip placement (11/07/2019); and polypectomy (11/07/2019).   Her family history includes Coronary artery disease in her brother; Diabetes in her  brother; Heart attack (age of onset: 13) in her father; Prostate cancer in her brother and son.She reports that she quit smoking about 44 years ago. Her smoking use included cigarettes. She has a 2.50 pack-year smoking history. She has never used smokeless tobacco. She reports that she does not drink alcohol or use drugs.    ROS Review of Systems  Constitutional: Positive for activity change (Pt. cannot move herself in bed).  HENT: Negative for congestion.   Eyes: Negative for visual disturbance.  Respiratory: Negative for shortness of breath.   Cardiovascular: Negative for chest pain.  Gastrointestinal: Positive for nausea. Negative for abdominal pain, anal bleeding, blood in stool, constipation and diarrhea.  Genitourinary: Negative for difficulty urinating.  Musculoskeletal: Negative for arthralgias and myalgias.  Neurological: Positive for headaches (Noted to have brain metastasis from her lung cancer.  Inoperable states Ms. Mcquinn husband for his discussion with Dr. Earlie Server).  Psychiatric/Behavioral: Negative for sleep disturbance.    Objective:  BP 135/66   Pulse 60   Temp 97.8 F (36.6 C) (Temporal)   Ht '5\' 4"'$  (1.626 m)   BMI 36.08 kg/m   BP Readings from Last 3 Encounters:  11/18/19 135/66  11/10/19 132/64  11/09/19 (!) 161/65    Wt Readings from Last 3 Encounters:  11/10/19 210 lb 3.2 oz (95.3 kg)  11/07/19 199 lb 15.3 oz (90.7 kg)  10/06/19 207 lb (93.9 kg)     Physical Exam Constitutional:      General: She is not in acute distress.    Appearance: She is well-developed. She is obese. She is ill-appearing. She is not toxic-appearing.  HENT:     Head: Normocephalic and atraumatic.  Eyes:     Conjunctiva/sclera: Conjunctivae normal.     Pupils: Pupils are equal, round, and reactive to light.  Neck:     Thyroid: No thyromegaly.  Cardiovascular:  Rate and Rhythm: Normal rate and regular rhythm.     Heart sounds: Normal heart sounds. No murmur.   Pulmonary:     Effort: Pulmonary effort is normal. No respiratory distress.     Breath sounds: Normal breath sounds. No wheezing or rales.  Abdominal:     General: Bowel sounds are normal. There is no distension.     Palpations: Abdomen is soft.     Tenderness: There is no abdominal tenderness.  Musculoskeletal:        General: Normal range of motion.     Cervical back: Normal range of motion and neck supple.  Lymphadenopathy:     Cervical: No cervical adenopathy.  Skin:    General: Skin is warm and dry.  Neurological:     Mental Status: She is alert.     Motor: Weakness present.     Gait: Gait abnormal (Wheelchair-bound).  Psychiatric:        Behavior: Behavior normal.       Assessment & Plan:   [name] was seen today for transitions of care.  Diagnoses and all orders for this visit:  Primary malignant neoplasm of left lung metastatic to other site ([place]) -     CBC with Differential/Platelet -     CMP14+EGFR -     For home use only DME Hospital bed  Brain metastasis ([place]) -     CBC with Differential/Platelet -     CMP14+EGFR -     For home use only DME Hospital bed  Essential hypertension -     CBC with Differential/Platelet -     CMP14+EGFR -     For home use only DME Hospital bed  Mixed hyperlipidemia -     CBC with Differential/Platelet -     CMP14+EGFR -     For home use only DME Hospital bed  Paroxysmal atrial fibrillation ([place]) -     For home use only DME Hospital bed  Polyp of stomach -     CBC with Differential/Platelet; Standing -     For home use only DME Hospital bed -     Ambulatory referral to Gastroenterology  Gastrointestinal hemorrhage associated with other gastritis -     CBC with Differential/Platelet; Standing -     For home use only DME Hospital bed -     Ambulatory referral to Gastroenterology  Other orders -     QUEtiapine (SEROQUEL) 100 MG tablet; Take 1 tablet (100 mg total) by mouth at bedtime.       I have changed  [name]'s QUEtiapine. I am also having her maintain her Melatonin, hydroxypropyl methylcellulose / hypromellose, escitalopram, fluticasone, gabapentin, levothyroxine, ondansetron, OXcarbazepine, rivaroxaban, silver sulfADIAZINE, capecitabine, temozolomide, amLODipine, furosemide, lovastatin, and pantoprazole.  Allergies as of 11/18/2019      Reactions   Levaquin [levofloxacin] Shortness Of Breath   Lisinopril Cough      Medication List       Accurate as of November 18, 2019  8:23 PM. If you have any questions, ask your nurse or doctor.        amLODipine 2.5 MG tablet Commonly known as: NORVASC TAKE 1 TABLET DAILY   capecitabine 500 MG tablet Commonly known as: XELODA TAKE 3 TABLETS ('1500MG'$ ) BY MOUTH TWICE DAILY AFTER A MEAL. TAKE ON DAYS 1-14 OF EACH 28 DAY CYCLE   escitalopram 20 MG tablet Commonly known as: LEXAPRO TAKE 1 TABLET AT BEDTIME   fluticasone 50 MCG/ACT nasal spray Commonly known as:  FLONASE Place 2 sprays into both nostrils at bedtime.   furosemide 20 MG tablet Commonly known as: LASIX TAKE 1 TABLET DAILY AS NEEDED FOR FLUID RETENTION What changed: See the new instructions.   gabapentin 400 MG capsule Commonly known as: NEURONTIN TAKE 1 TO 2 CAPSULES THREE TIMES A DAY What changed:   how much to take  how to take this  when to take this  additional instructions   hydroxypropyl methylcellulose / hypromellose 2.5 % ophthalmic solution Commonly known as: ISOPTO TEARS / GONIOVISC Place 1 drop into both eyes daily.   levothyroxine 75 MCG tablet Commonly known as: SYNTHROID Take 1 tablet (75 mcg total) by mouth daily before breakfast.   lovastatin 40 MG tablet Commonly known as: MEVACOR TAKE 1 TABLET AT BEDTIME   Melatonin 3 MG Tbdp Take 3-6 mg by mouth at bedtime as needed. What changed: reasons to take this   ondansetron 8 MG tablet Commonly known as: ZOFRAN Take 1 tablet (8 mg total) by mouth every 8 (eight) hours as needed for nausea  or vomiting.   OXcarbazepine 150 MG tablet Commonly known as: TRILEPTAL Take 1 tablet (150 mg total) by mouth 2 (two) times daily.   pantoprazole 40 MG tablet Commonly known as: PROTONIX Take 1 tablet (40 mg total) by mouth 2 (two) times daily.   QUEtiapine 100 MG tablet Commonly known as: SEROquel Take 1 tablet (100 mg total) by mouth at bedtime. What changed:   medication strength  how much to take Changed by: [name], MD   rivaroxaban 20 MG Tabs tablet Commonly known as: Xarelto TAKE 1 TABLET DAILY WITH SUPPER What changed:   how much to take  how to take this  when to take this  additional instructions   silver sulfADIAZINE 1 % cream Commonly known as: Silvadene Apply 1 application topically daily.   temozolomide 140 MG capsule Commonly known as: TEMODAR TAKE 2 CAPSULES BY MOUTH DAILY FOR 5 DAYS,ON DAYS 10-14 OF EACH 28DAY CYCLE. MAY TAKE ON EMPTY STOMACH AT BEDTIME TO DECREASE NAUSEA & VOMITING            Durable Medical Equipment  (From admission, onward)         Start     Ordered   11/18/19 0000  For home use only DME Hospital bed    Comments: Pt. Unable to move herself. Requiires a hoyer lift. She is at high risk for decubiti  Question Answer Comment  Length of Need Lifetime   The above medical condition requires: Patient requires the ability to reposition frequently   Head must be elevated greater than: 30 degrees   Bed type Semi-electric   Hoyer Lift Yes   Support Surface: Gel Overlay      11/18/19 1007           Follow-up: Return in about 6 weeks (around 12/30/2019).  [name], M.D.

## 2019-11-19 ENCOUNTER — Other Ambulatory Visit: Payer: Self-pay | Admitting: Family Medicine

## 2019-11-19 DIAGNOSIS — E038 Other specified hypothyroidism: Secondary | ICD-10-CM

## 2019-11-19 LAB — CMP14+EGFR
ALT: 7 IU/L (ref 0–32)
AST: 22 IU/L (ref 0–40)
Albumin/Globulin Ratio: 1.6 (ref 1.2–2.2)
Albumin: 3.5 g/dL — ABNORMAL LOW (ref 3.6–4.6)
Alkaline Phosphatase: 126 IU/L — ABNORMAL HIGH (ref 39–117)
BUN/Creatinine Ratio: 12 (ref 12–28)
BUN: 11 mg/dL (ref 8–27)
Bilirubin Total: 0.3 mg/dL (ref 0.0–1.2)
CO2: 23 mmol/L (ref 20–29)
Calcium: 9.3 mg/dL (ref 8.7–10.3)
Chloride: 107 mmol/L — ABNORMAL HIGH (ref 96–106)
Creatinine, Ser: 0.91 mg/dL (ref 0.57–1.00)
GFR calc Af Amer: 67 mL/min/{1.73_m2} (ref >59)
GFR calc non Af Amer: 59 mL/min/{1.73_m2} — ABNORMAL LOW (ref >59)
Globulin, Total: 2.2 g/dL (ref 1.5–4.5)
Glucose: 114 mg/dL — ABNORMAL HIGH (ref 65–99)
Potassium: 5 mmol/L (ref 3.5–5.2)
Sodium: 138 mmol/L (ref 134–144)
Total Protein: 5.7 g/dL — ABNORMAL LOW (ref 6.0–8.5)

## 2019-11-19 LAB — CBC WITH DIFFERENTIAL/PLATELET
Basophils Absolute: 0 10*3/uL (ref 0.0–0.2)
Basos: 1 %
EOS (ABSOLUTE): 0.1 10*3/uL (ref 0.0–0.4)
Eos: 6 %
Hematocrit: 30.8 % — ABNORMAL LOW (ref 34.0–46.6)
Hemoglobin: 9.6 g/dL — ABNORMAL LOW (ref 11.1–15.9)
Immature Grans (Abs): 0 10*3/uL (ref 0.0–0.1)
Immature Granulocytes: 1 %
Lymphocytes Absolute: 0.8 10*3/uL (ref 0.7–3.1)
Lymphs: 38 %
MCH: 30.7 pg (ref 26.6–33.0)
MCHC: 31.2 g/dL — ABNORMAL LOW (ref 31.5–35.7)
MCV: 98 fL — ABNORMAL HIGH (ref 79–97)
Monocytes Absolute: 0.4 10*3/uL (ref 0.1–0.9)
Monocytes: 19 %
Neutrophils Absolute: 0.7 10*3/uL — ABNORMAL LOW (ref 1.4–7.0)
Neutrophils: 35 %
Platelets: 69 10*3/uL — CL (ref 150–450)
RBC: 3.13 x10E6/uL — ABNORMAL LOW (ref 3.77–5.28)
RDW: 17 % — ABNORMAL HIGH (ref 11.7–15.4)
WBC: 2.1 10*3/uL — CL (ref 3.4–10.8)

## 2019-11-25 ENCOUNTER — Telehealth: Payer: Self-pay | Admitting: Family Medicine

## 2019-11-25 ENCOUNTER — Encounter: Payer: Self-pay | Admitting: Family Medicine

## 2019-11-25 NOTE — Telephone Encounter (Signed)
Patient aware and verbalized understanding. °

## 2019-11-30 ENCOUNTER — Ambulatory Visit (INDEPENDENT_AMBULATORY_CARE_PROVIDER_SITE_OTHER): Payer: Medicare Other | Admitting: *Deleted

## 2019-11-30 DIAGNOSIS — I495 Sick sinus syndrome: Secondary | ICD-10-CM

## 2019-11-30 DIAGNOSIS — R001 Bradycardia, unspecified: Secondary | ICD-10-CM

## 2019-11-30 LAB — CUP PACEART REMOTE DEVICE CHECK
Battery Remaining Longevity: 148 mo
Battery Voltage: 3.04 V
Brady Statistic AP VP Percent: 0.11 %
Brady Statistic AP VS Percent: 86.1 %
Brady Statistic AS VP Percent: 0.01 %
Brady Statistic AS VS Percent: 13.78 %
Brady Statistic RA Percent Paced: 85.89 %
Brady Statistic RV Percent Paced: 0.12 %
Date Time Interrogation Session: 20210502233851
Implantable Lead Implant Date: 20191024
Implantable Lead Implant Date: 20191024
Implantable Lead Location: 753859
Implantable Lead Location: 753860
Implantable Lead Model: 5076
Implantable Lead Model: 5076
Implantable Pulse Generator Implant Date: 20191024
Lead Channel Impedance Value: 285 Ohm
Lead Channel Impedance Value: 285 Ohm
Lead Channel Impedance Value: 342 Ohm
Lead Channel Impedance Value: 418 Ohm
Lead Channel Pacing Threshold Amplitude: 0.625 V
Lead Channel Pacing Threshold Amplitude: 1.125 V
Lead Channel Pacing Threshold Pulse Width: 0.4 ms
Lead Channel Pacing Threshold Pulse Width: 0.4 ms
Lead Channel Sensing Intrinsic Amplitude: 1.75 mV
Lead Channel Sensing Intrinsic Amplitude: 1.75 mV
Lead Channel Sensing Intrinsic Amplitude: 14.5 mV
Lead Channel Sensing Intrinsic Amplitude: 14.5 mV
Lead Channel Setting Pacing Amplitude: 1.5 V
Lead Channel Setting Pacing Amplitude: 2.5 V
Lead Channel Setting Pacing Pulse Width: 0.4 ms
Lead Channel Setting Sensing Sensitivity: 1.2 mV

## 2019-11-30 NOTE — Progress Notes (Signed)
Remote pacemaker transmission.   

## 2019-12-03 ENCOUNTER — Other Ambulatory Visit: Payer: Self-pay

## 2019-12-03 ENCOUNTER — Other Ambulatory Visit: Payer: Medicare Other

## 2019-12-03 DIAGNOSIS — K317 Polyp of stomach and duodenum: Secondary | ICD-10-CM

## 2019-12-03 DIAGNOSIS — K2961 Other gastritis with bleeding: Secondary | ICD-10-CM | POA: Diagnosis not present

## 2019-12-04 ENCOUNTER — Emergency Department (HOSPITAL_COMMUNITY)
Admission: EM | Admit: 2019-12-04 | Discharge: 2019-12-04 | Disposition: A | Discharge status: Home / Self Care | Payer: Medicare Other | Attending: Emergency Medicine | Admitting: Emergency Medicine

## 2019-12-04 ENCOUNTER — Ambulatory Visit (INDEPENDENT_AMBULATORY_CARE_PROVIDER_SITE_OTHER): Payer: Medicare Other | Admitting: Physician Assistant

## 2019-12-04 ENCOUNTER — Encounter (HOSPITAL_COMMUNITY): Payer: Self-pay

## 2019-12-04 ENCOUNTER — Other Ambulatory Visit: Payer: Self-pay

## 2019-12-04 ENCOUNTER — Encounter: Payer: Self-pay | Admitting: Physician Assistant

## 2019-12-04 VITALS — BP 202/78 | HR 64 | Temp 98.3°F | Ht 64.0 in | Wt 208.0 lb

## 2019-12-04 DIAGNOSIS — Z8673 Personal history of transient ischemic attack (TIA), and cerebral infarction without residual deficits: Secondary | ICD-10-CM | POA: Insufficient documentation

## 2019-12-04 DIAGNOSIS — Z7901 Long term (current) use of anticoagulants: Secondary | ICD-10-CM | POA: Insufficient documentation

## 2019-12-04 DIAGNOSIS — D62 Acute posthemorrhagic anemia: Secondary | ICD-10-CM

## 2019-12-04 DIAGNOSIS — Z8719 Personal history of other diseases of the digestive system: Secondary | ICD-10-CM | POA: Diagnosis not present

## 2019-12-04 DIAGNOSIS — Z8505 Personal history of malignant neoplasm of liver: Secondary | ICD-10-CM | POA: Insufficient documentation

## 2019-12-04 DIAGNOSIS — Z79899 Other long term (current) drug therapy: Secondary | ICD-10-CM | POA: Diagnosis not present

## 2019-12-04 DIAGNOSIS — R519 Headache, unspecified: Secondary | ICD-10-CM | POA: Diagnosis not present

## 2019-12-04 DIAGNOSIS — Z87891 Personal history of nicotine dependence: Secondary | ICD-10-CM | POA: Diagnosis not present

## 2019-12-04 DIAGNOSIS — I10 Essential (primary) hypertension: Secondary | ICD-10-CM | POA: Diagnosis not present

## 2019-12-04 DIAGNOSIS — Z950 Presence of cardiac pacemaker: Secondary | ICD-10-CM | POA: Diagnosis not present

## 2019-12-04 DIAGNOSIS — E039 Hypothyroidism, unspecified: Secondary | ICD-10-CM | POA: Insufficient documentation

## 2019-12-04 DIAGNOSIS — Z85118 Personal history of other malignant neoplasm of bronchus and lung: Secondary | ICD-10-CM | POA: Diagnosis not present

## 2019-12-04 LAB — BASIC METABOLIC PANEL
Anion gap: 10 (ref 5–15)
BUN: 12 mg/dL (ref 8–23)
CO2: 23 mmol/L (ref 22–32)
Calcium: 9.1 mg/dL (ref 8.9–10.3)
Chloride: 108 mmol/L (ref 98–111)
Creatinine, Ser: 0.69 mg/dL (ref 0.44–1.00)
GFR calc Af Amer: >60 mL/min (ref >60)
GFR calc non Af Amer: >60 mL/min (ref >60)
Glucose, Bld: 88 mg/dL (ref 70–99)
Potassium: 4.3 mmol/L (ref 3.5–5.1)
Sodium: 141 mmol/L (ref 135–145)

## 2019-12-04 LAB — CBC WITH DIFFERENTIAL/PLATELET
Abs Immature Granulocytes: 0.01 10*3/uL (ref 0.00–0.07)
Basophils Absolute: 0 10*3/uL (ref 0.0–0.2)
Basophils Absolute: 0 10*3/uL (ref 0.0–0.1)
Basophils Relative: 1 %
Basos: 2 %
EOS (ABSOLUTE): 0.1 10*3/uL (ref 0.0–0.4)
Eos: 5 %
Eosinophils Absolute: 0.2 10*3/uL (ref 0.0–0.5)
Eosinophils Relative: 5 %
HCT: 36.3 % (ref 36.0–46.0)
Hematocrit: 32.3 % — ABNORMAL LOW (ref 34.0–46.6)
Hemoglobin: 10.1 g/dL — ABNORMAL LOW (ref 11.1–15.9)
Hemoglobin: 10.7 g/dL — ABNORMAL LOW (ref 12.0–15.0)
Immature Grans (Abs): 0 10*3/uL (ref 0.0–0.1)
Immature Granulocytes: 0 %
Immature Granulocytes: 0 %
Lymphocytes Absolute: 0.8 10*3/uL (ref 0.7–3.1)
Lymphocytes Relative: 29 %
Lymphs Abs: 0.9 10*3/uL (ref 0.7–4.0)
Lymphs: 32 %
MCH: 29.9 pg (ref 26.6–33.0)
MCH: 29.2 pg (ref 26.0–34.0)
MCHC: 31.3 g/dL — ABNORMAL LOW (ref 31.5–35.7)
MCHC: 29.5 g/dL — ABNORMAL LOW (ref 30.0–36.0)
MCV: 96 fL (ref 79–97)
MCV: 99.2 fL (ref 80.0–100.0)
Monocytes Absolute: 0.4 10*3/uL (ref 0.1–0.9)
Monocytes Absolute: 0.5 10*3/uL (ref 0.1–1.0)
Monocytes Relative: 14 %
Monocytes: 14 %
Neutro Abs: 1.6 10*3/uL — ABNORMAL LOW (ref 1.7–7.7)
Neutrophils Absolute: 1.2 10*3/uL — ABNORMAL LOW (ref 1.4–7.0)
Neutrophils Relative %: 51 %
Neutrophils: 47 %
Platelets: 94 10*3/uL — CL (ref 150–450)
Platelets: 86 10*3/uL — ABNORMAL LOW (ref 150–400)
RBC: 3.38 x10E6/uL — ABNORMAL LOW (ref 3.77–5.28)
RBC: 3.66 MIL/uL — ABNORMAL LOW (ref 3.87–5.11)
RDW: 15 % (ref 11.7–15.4)
RDW: 16.3 % — ABNORMAL HIGH (ref 11.5–15.5)
WBC: 2.5 10*3/uL — CL (ref 3.4–10.8)
WBC: 3.2 10*3/uL — ABNORMAL LOW (ref 4.0–10.5)
nRBC: 0 % (ref 0.0–0.2)

## 2019-12-04 MED ORDER — ACETAMINOPHEN 325 MG PO TABS
650.0000 mg | ORAL_TABLET | Freq: Once | ORAL | Status: AC
  Administered 2019-12-04: 19:00:00 650 mg via ORAL
  Filled 2019-12-04: qty 2

## 2019-12-04 NOTE — ED Provider Notes (Signed)
[facility] DEPT Provider Note   CSN: [id number] Arrival date & time: 12/04/19  1501     History Chief Complaint  Patient presents with  . Hypertension    ALLEEN KEHM is a 86 y.o. female.  HPI Patient was at her routine visit today with her GI doctor when it was noticed that her blood pressure was elevated.  Her PCP was contacted who directed her to seek care at an emergency department.  Patient takes Norvasc for hypertension.  She is also on Lasix.  She has not had any additional GI bleeding.  She has multiple chronic medical disorders.  Patient is ED with her husband who contributes to history.  Patient has been having intermittent headaches, since she was in the hospital several weeks ago.  She has been eating well.  She is wheelchair-bound and is up only to transfer and only with assistance.  She is not having any shortness of breath or chest pain.  She has chronic blurred vision.  She is taking her usual medications.  Recently, her PCP increased her Seroquel, which she takes for depression.  She is followed by oncology for possible lung primary breath secondary metastases to liver and pancreas.  She saw her oncologist 3 weeks ago who advised a holiday from ongoing oral medication for treatment of cancer.  She has a stable meningioma of the right brain, last imaging February 2021.  There are no other known modifying factors.    Past Medical History:  Diagnosis Date  . Acute respiratory failure with hypoxia ([place]) 10/23/2015  . Allergic rhinitis    PT. DENIES  . Anxiety   . Aortic insufficiency    Echo 04/29/2018: EF 65-70, mild AS (mean 13), mod AI, Asc Aorta 42 mm (mildly dilated), mild LAE, PASP 41, pericardium normal in appearance.   . Arthritis    NECK  . Ataxia   . Back pain 12/13/2016  . Bradycardia    primarily nocturnal  . Burning tongue syndrome 25 years  . Cataract   . Cerebellar degeneration ([place])   . Chest pain 12/13/2016   Atypical  chest pain  . Chronic urinary tract infection   . Complication of anesthesia    low o2 sats, coded 30 years ago  . CVA (cerebral infarction) 05/2003  . Dehydration with hyponatremia 03/23/2018  . Depression   . DVT (deep venous thrombosis) ([place])   . Encounter for antineoplastic chemotherapy 10/09/2016  . Fatigue 01/03/2015  . Gait disorder   . Gastric polyps   . GERD (gastroesophageal reflux disease)   . Goals of care, counseling/discussion 10/09/2016  . H/O: CVA (cerebrovascular accident) 07/14/2016  . High cholesterol   . History of pericarditis   . Hyperlipidemia   . Hypotension   . Hypothyroidism   . IBS (irritable bowel syndrome)   . Liver cancer ([place]) dx'd 06/2016   liver  . Lung cancer ([place])   . Neuroendocrine cancer ([place]) 08/23/2016  . Obesity   . Paroxysmal atrial fibrillation ([name])    chads2vasc score is 6,  she is felt to be a poor candidate for anticoagulation  . Pericarditis   . Personal history of arterial venous malformation (AVM)    right side of face  . Pulmonary embolism ([place])   . Seizure disorder ([place])   . Seizures ([place]) 2003   " smelling"- Gabapentin "no problem"  . Shortness of breath dyspnea    with exertion  . Sick sinus syndrome ([place])   . Sternum  fx 10/27/2013  . Stroke [facility]) 5 years ago   Right side of face weak, slurred speach-   . Thyroid disease   . TIA (transient ischemic attack)   . UTI (lower urinary tract infection) 03/27/2016   "frequently"    Patient Active Problem List   Diagnosis Date Noted  . Acute blood loss anemia 11/07/2019  . Anemia in other chronic diseases classified elsewhere 11/07/2019  . Upper GI bleed 11/06/2019  . Anticoagulated   . Port-A-Cath in place 08/28/2019  . Encounter for antineoplastic chemotherapy 07/07/2019  . Skin breakdown 06/04/2018  . Candida infection 06/04/2018  . Sick sinus syndrome ([place]) 05/22/2018  . Mild aortic stenosis 05/21/2018  . Primary malignant neoplasm of lung metastatic to other site  [facility])   . Macrocytic anemia   . Thrombocytopenia ([place]) 03/23/2018  . Osteoarthritis of knee 01/10/2018  . Pulmonary embolism ([place]) 04/24/2017  . Neuroendocrine carcinoma metastatic to liver ([place]) 12/13/2016  . Liver metastases ([place]) 08/23/2016  . Late effect of cerebrovascular accident (CVA) 08/02/2016  . Meningioma ([place]) 07/14/2016  . Asthmatic bronchitis 10/23/2015  . GAD (generalized anxiety disorder) 09/30/2015  . Peripheral edema 09/30/2015  . Insomnia 09/30/2015  . Localization-related partial epilepsy with simple partial seizures ([place]) 06/27/2015  . Sinus bradycardia 01/03/2015  . Paroxysmal atrial fibrillation ([place]) 01/03/2015  . Morbid obesity ([place]) 01/03/2015  . Solitary pulmonary nodule 06/02/2014  . Thyroid nodule 05/08/2014  . Depression   . Seizure disorder ([place])   . Hypothyroidism   . Cerebellar degeneration ([place])   . Irritable bowel syndrome 02/18/2008  . Hyperlipemia 09/10/2006  . Essential hypertension 09/10/2006  . GERD (gastroesophageal reflux disease) 09/10/2006    Past Surgical History:  Procedure Laterality Date  . APPENDECTOMY  86 years old  . COLONOSCOPY  2006, 2009  . COLONOSCOPY WITH PROPOFOL N/A 03/28/2016   Procedure: COLONOSCOPY WITH PROPOFOL;  Surgeon: Gatha Mayer, MD;  Location: [facility];  Service: Endoscopy;  Laterality: N/A;  . CORONARY ANGIOGRAPHY N/A 05/22/2018   Procedure: CORONARY ANGIOGRAPHY (CATH LAB);  Surgeon: Lorretta Harp, MD;  Location: [facility] CV LAB;  Service: Cardiovascular;  Laterality: N/A;  . cyst removed  35 years ago  . EP IMPLANTABLE DEVICE N/A 01/06/2015   Procedure: Loop Recorder Insertion;  Surgeon: Thompson Grayer, MD;  Location: [facility] CV LAB;  Service: Cardiovascular;  Laterality: N/A;  . ESOPHAGOGASTRODUODENOSCOPY (EGD) WITH PROPOFOL N/A 11/07/2019   Procedure: ESOPHAGOGASTRODUODENOSCOPY (EGD) WITH PROPOFOL;  Surgeon: Yetta Flock, MD;  Location: [facility];  Service: Gastroenterology;   Laterality: N/A;  . EYE SURGERY Right    Cataract  . HEMOSTASIS CLIP PLACEMENT  11/07/2019   Procedure: HEMOSTASIS CLIP PLACEMENT;  Surgeon: Yetta Flock, MD;  [facility];  Service: Gastroenterology;;  . HEMOSTASIS CONTROL  11/07/2019   Procedure: HEMOSTASIS CONTROL;  Surgeon: Yetta Flock, MD;  [facility];  Service: Gastroenterology;;  . Chrystine Oiler SELECTIVE EACH ADDITIONAL VESSEL  11/28/2016  . IR ANGIOGRAM SELECTIVE EACH ADDITIONAL VESSEL  11/28/2016  . IR ANGIOGRAM SELECTIVE EACH ADDITIONAL VESSEL  11/28/2016  . IR ANGIOGRAM SELECTIVE EACH ADDITIONAL VESSEL  11/28/2016  . IR ANGIOGRAM SELECTIVE EACH ADDITIONAL VESSEL  12/13/2016  . IR ANGIOGRAM SELECTIVE EACH ADDITIONAL VESSEL  12/13/2016  . IR ANGIOGRAM SELECTIVE EACH ADDITIONAL VESSEL  01/09/2017  . IR ANGIOGRAM VISCERAL SELECTIVE  11/28/2016  . IR ANGIOGRAM VISCERAL SELECTIVE  11/28/2016  . IR ANGIOGRAM VISCERAL SELECTIVE  12/13/2016  . IR ANGIOGRAM VISCERAL SELECTIVE  12/13/2016  .  IR ANGIOGRAM VISCERAL SELECTIVE  01/09/2017  . IR EMBO ARTERIAL NOT HEMORR HEMANG INC GUIDE ROADMAPPING  11/28/2016  . IR EMBO TUMOR ORGAN ISCHEMIA INFARCT INC GUIDE ROADMAPPING  12/13/2016  . IR EMBO TUMOR ORGAN ISCHEMIA INFARCT INC GUIDE ROADMAPPING  01/09/2017  . IR IVC FILTER PLMT / S&I /IMG GUID/MOD SED  04/24/2017  . IR RADIOLOGIST EVAL & MGMT  11/06/2016  . IR RADIOLOGIST EVAL & MGMT  01/02/2017  . IR RADIOLOGIST EVAL & MGMT  02/05/2017  . IR RADIOLOGIST EVAL & MGMT  05/02/2017  . IR RADIOLOGIST EVAL & MGMT  10/17/2017  . IR US GUIDE VASC ACCESS RIGHT  11/28/2016  . IR US GUIDE VASC ACCESS RIGHT  12/13/2016  . IR US GUIDE VASC ACCESS RIGHT  01/09/2017  . KNEE ARTHROSCOPY Right 11/14/2006  . KNEE ARTHROSCOPY Bilateral 5 and 6 years ago  . KNEE ARTHROSCOPY WITH LATERAL MENISECTOMY  07/03/2012   Procedure: KNEE ARTHROSCOPY WITH LATERAL MENISECTOMY;  Surgeon: Magnus Sinning, MD;  [facility];  Service: Orthopedics;  Laterality:  Left;  with Partial Lateral Menisectomy and Medial Menisectomy. Shaving of medial and lateral femoral condyles. Shaving of patella. Removal of a loose body  . LOOP RECORDER REMOVAL N/A 05/22/2018   Procedure: LOOP RECORDER REMOVAL;  Surgeon: Thompson Grayer, MD;  Location: [facility] LAB;  Service: Cardiovascular;  Laterality: N/A;  . PACEMAKER IMPLANT N/A 05/22/2018   MDT Azure XT DR MRI implanted by Dr Rayann Heman for sick sinus syndrome  . POLYPECTOMY  11/07/2019   Procedure: POLYPECTOMY;  Surgeon: Yetta Flock, MD;  [facility];  Service: Gastroenterology;;  . tibial and fibular internal fixation Left   . TOTAL ABDOMINAL HYSTERECTOMY  86 years old  . UPPER GASTROINTESTINAL ENDOSCOPY  2009, 2013     OB History   No obstetric history on file.     Family History  Problem Relation Age of Onset  . Heart attack Father 65       fatal  . Peptic Ulcer Mother   . Coronary artery disease Brother   . Diabetes Brother   . Colon cancer Brother 46  . Colon polyps Brother   . Prostate cancer Brother   . Diabetes Brother   . Prostate cancer Son   . COPD Daughter     Social History   Tobacco Use  . Smoking status: Former Smoker    Packs/day: 0.25    Years: 10.00    Pack years: 2.50    Types: Cigarettes    Quit date: 07/31/1975    Years since quitting: 44.3  . Smokeless tobacco: Never Used  Substance Use Topics  . Alcohol use: No  . Drug use: No    Home Medications Prior to Admission medications   Medication Sig Start Date End Date Taking? Authorizing Provider  amLODipine (NORVASC) 2.5 MG tablet TAKE 1 TABLET DAILY Patient taking differently: Take 2.5 mg by mouth daily.  09/22/19   [name], MD  capecitabine (XELODA) 500 MG tablet TAKE 3 TABLETS (1500MG ) BY MOUTH TWICE DAILY AFTER A MEAL. TAKE ON DAYS 1-14 OF EACH 28 DAY CYCLE Patient not taking: Reported on 12/04/2019 09/10/19   Heilingoetter, Cassandra L, PA-C  escitalopram (LEXAPRO) 20 MG tablet TAKE 1 TABLET  AT BEDTIME Patient taking differently: Take 20 mg by mouth at bedtime.  04/08/19   [name], MD  fluticasone (FLONASE) 50 MCG/ACT nasal spray Place 2 sprays into both nostrils at bedtime. 04/14/19   [name], MD  furosemide (LASIX) 20 MG tablet TAKE 1 TABLET DAILY AS NEEDED FOR FLUID RETENTION Patient taking differently: Take 20 mg by mouth daily as needed for fluid.  10/13/19   [name], MD  gabapentin (NEURONTIN) 400 MG capsule TAKE 1 TO 2 CAPSULES THREE TIMES A DAY Patient taking differently: Take 400-800 mg by mouth 3 (three) times daily.  04/14/19   [name], MD  hydroxypropyl methylcellulose / hypromellose (ISOPTO TEARS / GONIOVISC) 2.5 % ophthalmic solution Place 1 drop into both eyes daily.     [provider]  lovastatin (MEVACOR) 40 MG tablet TAKE 1 TABLET AT BEDTIME Patient taking differently: Take 40 mg by mouth at bedtime.  10/13/19   [name], MD  Melatonin 3 MG TBDP Take 3-6 mg by mouth at bedtime as needed. Patient taking differently: Take 3-6 mg by mouth at bedtime as needed (sleep).  08/02/16   Eustaquio Maize, MD  ondansetron (ZOFRAN) 8 MG tablet Take 1 tablet (8 mg total) by mouth every 8 (eight) hours as needed for nausea or vomiting. 04/14/19   [name], MD  OXcarbazepine (TRILEPTAL) 150 MG tablet Take 1 tablet (150 mg total) by mouth 2 (two) times daily. 04/14/19   [name], MD  pantoprazole (PROTONIX) 40 MG tablet Take 1 tablet (40 mg total) by mouth 2 (two) times daily. 11/09/19 12/09/19  Arrien, Jimmy Picket, MD  QUEtiapine (SEROQUEL) 100 MG tablet Take 1 tablet (100 mg total) by mouth at bedtime. 11/18/19   [name], MD  rivaroxaban (XARELTO) 20 MG TABS tablet TAKE 1 TABLET DAILY WITH SUPPER Patient taking differently: Take 20 mg by mouth daily with supper.  04/14/19   [name], MD  silver sulfADIAZINE (SILVADENE) 1 % cream Apply 1 application topically daily. 06/17/19   [name], MD  SYNTHROID 75 MCG tablet TAKE  1 TABLET DAILY BEFORE BREAKFAST 11/19/19   [name], MD  temozolomide (TEMODAR) 140 MG capsule TAKE 2 CAPSULES BY MOUTH DAILY FOR 5 DAYS,ON DAYS 10-14 OF EACH 28DAY CYCLE. MAY TAKE ON EMPTY STOMACH AT BEDTIME TO DECREASE NAUSEA & VOMITING Patient not taking: Reported on 12/04/2019 09/10/19   Heilingoetter, Cassandra L, PA-C    Allergies    Levaquin [levofloxacin] and Lisinopril  Review of Systems   Review of Systems  Physical Exam Updated Vital Signs BP (!) 171/85   Pulse 83   Temp 97.7 F (36.5 C) (Oral)   Resp (!) 22   Ht 5\' 4"  (1.626 m)   Wt 94.3 kg   SpO2 94%   BMI 35.70 kg/m   Physical Exam Vitals and nursing note reviewed.  Constitutional:      General: She is not in acute distress.    Appearance: She is well-developed. She is obese. She is not ill-appearing, toxic-appearing or diaphoretic.  HENT:     Head: Normocephalic and atraumatic.     Right Ear: External ear normal.     Left Ear: External ear normal.  Eyes:     Conjunctiva/sclera: Conjunctivae normal.     Pupils: Pupils are equal, round, and reactive to light.  Neck:     Trachea: Phonation normal.  Cardiovascular:     Rate and Rhythm: Normal rate. Rhythm irregular.     Heart sounds: Normal heart sounds.  Pulmonary:     Effort: Pulmonary effort is normal.     Breath sounds: Normal breath sounds.  Abdominal:     General: There is no distension.     Palpations: Abdomen is soft.  Tenderness: There is no abdominal tenderness.  Musculoskeletal:        General: Normal range of motion.     Cervical back: Normal range of motion and neck supple.  Skin:    General: Skin is warm and dry.  Neurological:     Mental Status: She is alert.     Cranial Nerves: No cranial nerve deficit.     Motor: No abnormal muscle tone.     Coordination: Coordination normal.     Comments: No dysarthria or aphasia  Psychiatric:        Mood and Affect: Mood normal.        Behavior: Behavior normal.     ED Results /  Procedures / Treatments   Labs (all labs ordered are listed, but only abnormal results are displayed) Labs Reviewed  CBC WITH DIFFERENTIAL/PLATELET - Abnormal; Notable for the following components:      Result Value   WBC 3.2 (*)    RBC 3.66 (*)    Hemoglobin 10.7 (*)    MCHC 29.5 (*)    RDW 16.3 (*)    Platelets 86 (*)    Neutro Abs 1.6 (*)    All other components within normal limits  BASIC METABOLIC PANEL    EKG None  Radiology No results found.  Procedures Procedures (including critical care time)  Medications Ordered in ED Medications  acetaminophen (TYLENOL) tablet 650 mg (650 mg Oral Given 12/04/19 1906)    ED Course  I have reviewed the triage vital signs and the nursing notes.  Pertinent labs & imaging results that were available during my care of the patient were reviewed by me and considered in my medical decision making (see chart for details).  Clinical Course as of Dec 04 2031  Fri Dec 04, 2019  2021 Normal  Basic metabolic panel [EW]  3154 Normal except white count low, hemoglobin low, platelets low  CBC with Differential(!) [EW]    Clinical Course User Index [EW] Daleen Bo, MD   MDM Rules/Calculators/A&P                      Patient Vitals for the past 24 hrs:  BP Temp Temp src Pulse Resp SpO2 Height Weight  12/04/19 2030 (!) 171/85 - - 83 (!) 22 94 % - -  12/04/19 2000 (!) 161/87 - - 65 (!) 30 94 % - -  12/04/19 1930 (!) 170/78 - - 68 (!) 25 98 % - -  12/04/19 1836 (!) 197/91 - - 89 (!) 21 95 % - -  12/04/19 1800 (!) 189/79 - - 80 (!) 22 95 % - -  12/04/19 1730 (!) 176/83 - - 78 14 94 % - -  12/04/19 1700 (!) 186/81 - - 80 18 96 % - -  12/04/19 1630 (!) 165/82 - - 83 16 97 % - -  12/04/19 1544 (!) 208/92 - - 92 18 97 % - -  12/04/19 1513 (!) 206/101 97.7 F (36.5 C) Oral 63 18 97 % - -  12/04/19 1512 - - - - - - 5\' 4"  (1.626 m) 94.3 kg    8:33 PM Reevaluation with update and discussion. After initial assessment and treatment, an  updated evaluation reveals patient is comfortable, mild blood pressure elevation is currently present.  She states her headache is improved.  Findings discussed with the patient, and her husband at the bedside, all questions answered. Daleen Bo   Medical Decision Making:  This  patient is presenting for evaluation of high blood pressure, which does require a range of treatment options, and is a complaint that involves a high risk of morbidity and mortality. The differential diagnoses include hypertension, renal failure, cardiovascular disorder. I decided  to review old records, and in summary patient with known hypertension, neuroendocrine tumor with metastases, and is a debilitated elderly patient with numerous medical problems..  I obtained additional historical information from her husband, at the bedside.  Clinical Laboratory Tests Ordered, included CBC, metabolic panel. Review indicates stable findings.   Critical Interventions-clinical evaluation, laboratory testing, observation  After These Interventions, the Patient was reevaluated and was found with persistent mild high blood pressure.  Headache improved with Tylenol.  Doubt hypertensive urgency, significant change in chronic oncologic state, metabolic instability or impending vascular collapse.  CRITICAL CARE-no Performed by: Daleen Bo    Nursing Notes Reviewed/ Care Coordinated Applicable Imaging Reviewed Interpretation of Laboratory Data incorporated into ED treatment  The patient appears reasonably screened and/or stabilized for discharge and I doubt any other medical condition or other [facility] requiring further screening, evaluation, or treatment in the ED at this time prior to discharge.  Plan: Home Medications-increase amlodipine to 5 mg each day, continue remainder of medications; Home Treatments-rest, usual diet; return here if the recommended treatment, does not improve the symptoms; Recommended follow up-PCP follow-up  1 week and as needed  Final Clinical Impression(s) / ED Diagnoses Final diagnoses:  Hypertension, unspecified type    Rx / DC Orders ED Discharge Orders    None       Daleen Bo, MD 12/04/19 2050

## 2019-12-04 NOTE — Discharge Instructions (Addendum)
For now, double your dose of amlodipine to 5 mg each day, to help bring the blood pressure down.  Keep close track of your blood pressure by checking it 2 or 3 times daily.  When you see your doctor next week take a list of your blood pressure readings, so they can help you make some decisions for ongoing care.

## 2019-12-04 NOTE — Progress Notes (Signed)
Chief Complaint: Follow-up hospitalization for GI bleed  HPI:    [name] is an 86 year old female with a past medical history as listed below including neuroendocrine tumor on chemotherapy, pulmonary embolus on Xarelto, A. fib, status post pacemaker, chronic debilitation, nonambulatory and others, known to Dr. Carlean Purl, who presents to clinic today for follow-up after being seen in the hospital for melena.    11/06/2019 consult by her service.  At that time patient had been experiencing worsening anemia with melena in the setting of Xarelto.  Hemoglobin 10.5-6.8.  11/07/2019 EGD with 4 gastric polyps with oozing, 5 angiodysplastic lesions versus flat vascular polyps in the stomach treated with APC, mild gastric antral vascular ectasia, multiple additional benign-appearing gastric polyps, normal duodenum.  It was thought that overall the patient had been bleeding from hemorrhagic polyp/AVMs in the stomach in the setting of anticoagulation use and thrombocytopenia.  Was recommended that she hold her Xarelto for at least a week if okay to do so per oncology.  There is no evidence of recurrent bleeding overnight and the patient was continued on a twice daily PPI.  Pathology showed hyperplastic polyps.    12/03/2019 CBC with a hemoglobin of 10.1 and general pancytopenia thought related to chemo.    Today, the patient presents to clinic accompanied by her husband and together they tell me that she is doing very well.  She has had no further bleeding and is taking her Pantoprazole 40 mg twice daily.  She has no complaints other than that she is getting headache every night, her husband says every day for the past 7 days or so.    Denies fever, chills, abdominal pain or other symptoms.  Past Medical History:  Diagnosis Date  . Acute respiratory failure with hypoxia ([place]) 10/23/2015  . Allergic rhinitis    PT. DENIES  . Anxiety   . Aortic insufficiency    Echo 04/29/2018: EF 65-70, mild AS (mean 13), mod AI,  Asc Aorta 42 mm (mildly dilated), mild LAE, PASP 41, pericardium normal in appearance.   . Arthritis    NECK  . Ataxia   . Back pain 12/13/2016  . Bradycardia    primarily nocturnal  . Burning tongue syndrome 25 years  . Cataract   . Cerebellar degeneration ([place])   . Chest pain 12/13/2016   Atypical chest pain  . Chronic urinary tract infection   . Complication of anesthesia    low o2 sats, coded 30 years ago  . CVA (cerebral infarction) 05/2003  . Dehydration with hyponatremia 03/23/2018  . Depression   . DVT (deep venous thrombosis) ([place])   . Encounter for antineoplastic chemotherapy 10/09/2016  . Fatigue 01/03/2015  . Gait disorder   . Gastric polyps   . GERD (gastroesophageal reflux disease)   . Goals of care, counseling/discussion 10/09/2016  . H/O: CVA (cerebrovascular accident) 07/14/2016  . High cholesterol   . History of pericarditis   . Hyperlipidemia   . Hypotension   . Hypothyroidism   . IBS (irritable bowel syndrome)   . Liver cancer ([place]) dx'd 06/2016   liver  . Lung cancer ([place])   . Neuroendocrine cancer ([place]) 08/23/2016  . Obesity   . Paroxysmal atrial fibrillation ([name])    chads2vasc score is 6,  she is felt to be a poor candidate for anticoagulation  . Pericarditis   . Personal history of arterial venous malformation (AVM)    right side of face  . Pulmonary embolism ([place])   . Seizure  disorder ([place])   . Seizures ([place]) 2003   " smelling"- Gabapentin "no problem"  . Shortness of breath dyspnea    with exertion  . Sick sinus syndrome ([place])   . Sternum fx 10/27/2013  . Stroke [facility]) 5 years ago   Right side of face weak, slurred speach-   . Thyroid disease   . TIA (transient ischemic attack)   . UTI (lower urinary tract infection) 03/27/2016   "frequently"    Past Surgical History:  Procedure Laterality Date  . APPENDECTOMY  86 years old  . COLONOSCOPY  2006, 2009  . COLONOSCOPY WITH PROPOFOL N/A 03/28/2016   Procedure: COLONOSCOPY WITH PROPOFOL;   Surgeon: Gatha Mayer, MD;  Location: [facility];  Service: Endoscopy;  Laterality: N/A;  . CORONARY ANGIOGRAPHY N/A 05/22/2018   Procedure: CORONARY ANGIOGRAPHY (CATH LAB);  Surgeon: Lorretta Harp, MD;  Location: [facility];  Service: Cardiovascular;  Laterality: N/A;  . cyst removed  35 years ago  . EP IMPLANTABLE DEVICE N/A 01/06/2015   Procedure: Loop Recorder Insertion;  Surgeon: Thompson Grayer, MD;  Location: [facility];  Service: Cardiovascular;  Laterality: N/A;  . ESOPHAGOGASTRODUODENOSCOPY (EGD) WITH PROPOFOL N/A 11/07/2019   Procedure: ESOPHAGOGASTRODUODENOSCOPY (EGD) WITH PROPOFOL;  Surgeon: Yetta Flock, MD;  [facility];  Service: Gastroenterology;  Laterality: N/A;  . EYE SURGERY Right    Cataract  . HEMOSTASIS CLIP PLACEMENT  11/07/2019   Procedure: HEMOSTASIS CLIP PLACEMENT;  Surgeon: Yetta Flock, MD;  [facility];  Service: Gastroenterology;;  . HEMOSTASIS CONTROL  11/07/2019   Procedure: HEMOSTASIS CONTROL;  Surgeon: Yetta Flock, MD;  [facility];  Service: Gastroenterology;;  . Chrystine Oiler SELECTIVE EACH ADDITIONAL VESSEL  11/28/2016  . IR ANGIOGRAM SELECTIVE EACH ADDITIONAL VESSEL  11/28/2016  . IR ANGIOGRAM SELECTIVE EACH ADDITIONAL VESSEL  11/28/2016  . IR ANGIOGRAM SELECTIVE EACH ADDITIONAL VESSEL  11/28/2016  . IR ANGIOGRAM SELECTIVE EACH ADDITIONAL VESSEL  12/13/2016  . IR ANGIOGRAM SELECTIVE EACH ADDITIONAL VESSEL  12/13/2016  . IR ANGIOGRAM SELECTIVE EACH ADDITIONAL VESSEL  01/09/2017  . IR ANGIOGRAM VISCERAL SELECTIVE  11/28/2016  . IR ANGIOGRAM VISCERAL SELECTIVE  11/28/2016  . IR ANGIOGRAM VISCERAL SELECTIVE  12/13/2016  . IR ANGIOGRAM VISCERAL SELECTIVE  12/13/2016  . IR ANGIOGRAM VISCERAL SELECTIVE  01/09/2017  . IR EMBO ARTERIAL NOT HEMORR HEMANG INC GUIDE ROADMAPPING  11/28/2016  . IR EMBO TUMOR ORGAN ISCHEMIA INFARCT INC GUIDE ROADMAPPING  12/13/2016  . IR EMBO TUMOR ORGAN ISCHEMIA INFARCT INC  GUIDE ROADMAPPING  01/09/2017  . IR IVC FILTER PLMT / S&I /IMG GUID/MOD SED  04/24/2017  . IR RADIOLOGIST EVAL & MGMT  11/06/2016  . IR RADIOLOGIST EVAL & MGMT  01/02/2017  . IR RADIOLOGIST EVAL & MGMT  02/05/2017  . IR RADIOLOGIST EVAL & MGMT  05/02/2017  . IR RADIOLOGIST EVAL & MGMT  10/17/2017  . IR US GUIDE VASC ACCESS RIGHT  11/28/2016  . IR US GUIDE VASC ACCESS RIGHT  12/13/2016  . IR US GUIDE VASC ACCESS RIGHT  01/09/2017  . KNEE ARTHROSCOPY Right 11/14/2006  . KNEE ARTHROSCOPY Bilateral 5 and 6 years ago  . KNEE ARTHROSCOPY WITH LATERAL MENISECTOMY  07/03/2012   Procedure: KNEE ARTHROSCOPY WITH LATERAL MENISECTOMY;  Surgeon: Magnus Sinning, MD;  [facility];  Service: Orthopedics;  Laterality: Left;  with Partial Lateral Menisectomy and Medial Menisectomy. Shaving of medial and lateral femoral condyles. Shaving of patella. Removal of a loose body  .  LOOP RECORDER REMOVAL N/A 05/22/2018   Procedure: LOOP RECORDER REMOVAL;  Surgeon: Thompson Grayer, MD;  Location: [facility] [facility];  Service: Cardiovascular;  Laterality: N/A;  . PACEMAKER IMPLANT N/A 05/22/2018   MDT Azure XT DR MRI implanted by Dr Rayann Heman for sick sinus syndrome  . POLYPECTOMY  11/07/2019   Procedure: POLYPECTOMY;  Surgeon: Yetta Flock, MD;  [facility];  Service: Gastroenterology;;  . tibial and fibular internal fixation Left   . TOTAL ABDOMINAL HYSTERECTOMY  86 years old  . UPPER GASTROINTESTINAL ENDOSCOPY  2009, 2013    Current Outpatient Medications  Medication Sig Dispense Refill  . amLODipine (NORVASC) 2.5 MG tablet TAKE 1 TABLET DAILY (Patient taking differently: Take 2.5 mg by mouth daily. ) 90 tablet 0  . escitalopram (LEXAPRO) 20 MG tablet TAKE 1 TABLET AT BEDTIME (Patient taking differently: Take 20 mg by mouth at bedtime. ) 90 tablet 3  . fluticasone (FLONASE) 50 MCG/ACT nasal spray Place 2 sprays into both nostrils at bedtime. 48 g 3  . furosemide (LASIX) 20 MG tablet TAKE 1 TABLET DAILY  AS NEEDED FOR FLUID RETENTION (Patient taking differently: Take 20 mg by mouth daily as needed for fluid. ) 90 tablet 3  . gabapentin (NEURONTIN) 400 MG capsule TAKE 1 TO 2 CAPSULES THREE TIMES A DAY (Patient taking differently: Take 400-800 mg by mouth 3 (three) times daily. ) 720 capsule 1  . hydroxypropyl methylcellulose / hypromellose (ISOPTO TEARS / GONIOVISC) 2.5 % ophthalmic solution Place 1 drop into both eyes daily.     Marland Kitchen lovastatin (MEVACOR) 40 MG tablet TAKE 1 TABLET AT BEDTIME (Patient taking differently: Take 40 mg by mouth at bedtime. ) 90 tablet 3  . Melatonin 3 MG TBDP Take 3-6 mg by mouth at bedtime as needed. (Patient taking differently: Take 3-6 mg by mouth at bedtime as needed (sleep). ) 60 tablet 1  . ondansetron (ZOFRAN) 8 MG tablet Take 1 tablet (8 mg total) by mouth every 8 (eight) hours as needed for nausea or vomiting. 90 tablet 1  . OXcarbazepine (TRILEPTAL) 150 MG tablet Take 1 tablet (150 mg total) by mouth 2 (two) times daily. 180 tablet 3  . pantoprazole (PROTONIX) 40 MG tablet Take 1 tablet (40 mg total) by mouth 2 (two) times daily. 60 tablet 0  . QUEtiapine (SEROQUEL) 100 MG tablet Take 1 tablet (100 mg total) by mouth at bedtime. 30 tablet 1  . rivaroxaban (XARELTO) 20 MG TABS tablet TAKE 1 TABLET DAILY WITH SUPPER (Patient taking differently: Take 20 mg by mouth daily with supper. ) 90 tablet 3  . silver sulfADIAZINE (SILVADENE) 1 % cream Apply 1 application topically daily. 400 g 0  . SYNTHROID 75 MCG tablet TAKE 1 TABLET DAILY BEFORE BREAKFAST 90 tablet 3  . capecitabine (XELODA) 500 MG tablet TAKE 3 TABLETS (1500MG ) BY MOUTH TWICE DAILY AFTER A MEAL. TAKE ON DAYS 1-14 OF EACH 28 DAY CYCLE (Patient not taking: Reported on 12/04/2019) 84 tablet 2  . temozolomide (TEMODAR) 140 MG capsule TAKE 2 CAPSULES BY MOUTH DAILY FOR 5 DAYS,ON DAYS 10-14 OF EACH 28DAY CYCLE. MAY TAKE ON EMPTY STOMACH AT BEDTIME TO DECREASE NAUSEA & VOMITING (Patient not taking: Reported on  12/04/2019) 10 capsule 2   No current facility-administered medications for this visit.   Facility-Administered Medications Ordered in Other Visits  Medication Dose Route Frequency Provider Last Rate Last Admin  . heparin lock flush 100 unit/mL  500 Units Intracatheter Daily PRN Heilingoetter, Cassandra L,  PA-C        Allergies as of 12/04/2019 - Review Complete 12/04/2019  Allergen Reaction Noted  . Levaquin [levofloxacin] Shortness Of Breath 10/13/2018  . Lisinopril Cough 05/02/2015    Family History  Problem Relation Age of Onset  . Heart attack Father 20       fatal  . Coronary artery disease Brother   . Diabetes Brother   . Prostate cancer Brother   . Prostate cancer Son     Social History   Socioeconomic History  . Marital status: Married    Spouse name: Averie Hornbaker  . Number of children: 6  . Years of education: 38  . Highest education level: 10th grade  Occupational History  . Occupation: Homemaker  Tobacco Use  . Smoking status: Former Smoker    Packs/day: 0.25    Years: 10.00    Pack years: 2.50    Types: Cigarettes    Quit date: 07/31/1975    Years since quitting: 44.3  . Smokeless tobacco: Never Used  Substance and Sexual Activity  . Alcohol use: No    Comment: Rare- maybe a drink ever 2 years  . Drug use: No  . Sexual activity: Not Currently  Other Topics Concern  . Not on file  Social History Narrative   Lives with husband, does have stairs, does not use them. Pt completed 10th grade.   Right handed   Social Determinants of Health   Financial Resource Strain:   . Difficulty of Paying Living Expenses:   Food Insecurity:   . Worried About Charity fundraiser in the Last Year:   . Arboriculturist in the Last Year:   Transportation Needs:   . Film/video editor (Medical):   Marland Kitchen Lack of Transportation (Non-Medical):   Physical Activity:   . Days of Exercise per Week:   . Minutes of Exercise per Session:   Stress:   . Feeling of Stress :     Social Connections:   . Frequency of Communication with Friends and Family:   . Frequency of Social Gatherings with Friends and Family:   . Attends Religious Services:   . Active Member of Clubs or Organizations:   . Attends Archivist Meetings:   Marland Kitchen Marital Status:   Intimate Partner Violence:   . Fear of Current or Ex-Partner:   . Emotionally Abused:   Marland Kitchen Physically Abused:   . Sexually Abused:     Review of Systems:    Constitutional: No weight loss, fever or chills Cardiovascular: No chest pain  Respiratory: No SOB  Gastrointestinal: See HPI and otherwise negative   Physical Exam:  Vital signs: BP (!) 190/72 (BP Location: Left Arm, Patient Position: Sitting, Cuff Size: Normal)   Pulse 64   Temp 98.3 F (36.8 C)   Ht 5\' 4"  (1.626 m) Comment: unable to obtain-wheelchair, recorded from previous ht  Wt 208 lb (94.3 kg)   BMI 35.70 kg/m   Constitutional:   Pleasant elderly Caucasian female appears to be in NAD, Well developed, Well nourished, alert and cooperative Respiratory: Respirations even and unlabored. Lungs clear to auscultation bilaterally.   No wheezes, crackles, or rhonchi.  Cardiovascular: Normal S1, S2. No MRG. Regular rate and rhythm. No peripheral edema, cyanosis or pallor.  Gastrointestinal:  Soft, nondistended, nontender. No rebound or guarding. Normal bowel sounds. No appreciable masses or hepatomegaly. Rectal:  Not performed.  Msk:  Symmetrical without gross deformities. Without edema, no deformity or joint abnormality.  Ambulates in wheelchair Psychiatric: Demonstrates good judgement and reason without abnormal affect or behaviors.  RELEVANT LABS AND IMAGING: CBC    Component Value Date/Time   WBC 2.5 (LL) 12/03/2019 1117   WBC 3.3 (L) 11/09/2019 0418   RBC 3.38 (L) 12/03/2019 1117   RBC 2.97 (L) 11/09/2019 0418   HGB 10.1 (L) 12/03/2019 1117   HGB 12.4 07/10/2017 1142   HCT 32.3 (L) 12/03/2019 1117   HCT 37.4 07/10/2017 1142   PLT 94 (LL)  12/03/2019 1117   MCV 96 12/03/2019 1117   MCV 106.7 (H) 07/10/2017 1142   MCH 29.9 12/03/2019 1117   MCH 31.3 11/09/2019 0418   MCHC 31.3 (L) 12/03/2019 1117   MCHC 32.0 11/09/2019 0418   RDW 15.0 12/03/2019 1117   RDW 17.1 (H) 07/10/2017 1142   LYMPHSABS 0.8 12/03/2019 1117   LYMPHSABS 0.7 (L) 07/10/2017 1142   MONOABS 0.6 11/09/2019 0418   MONOABS 0.4 07/10/2017 1142   EOSABS 0.1 12/03/2019 1117   BASOSABS 0.0 12/03/2019 1117   BASOSABS 0.0 07/10/2017 1142    CMP     Component Value Date/Time   NA 138 11/18/2019 1000   NA 135 (L) 07/10/2017 1142   K 5.0 11/18/2019 1000   K 4.6 07/10/2017 1142   CL 107 (H) 11/18/2019 1000   CO2 23 11/18/2019 1000   CO2 25 07/10/2017 1142   GLUCOSE 114 (H) 11/18/2019 1000   GLUCOSE 121 (H) 11/08/2019 0440   GLUCOSE 117 07/10/2017 1142   BUN 11 11/18/2019 1000   BUN 11.4 07/10/2017 1142   CREATININE 0.91 11/18/2019 1000   CREATININE 0.83 11/06/2019 1024   CREATININE 1.0 07/10/2017 1142   CALCIUM 9.3 11/18/2019 1000   CALCIUM 9.9 07/10/2017 1142   PROT 5.7 (L) 11/18/2019 1000   PROT 6.9 07/10/2017 1142   ALBUMIN 3.5 (L) 11/18/2019 1000   ALBUMIN 3.6 07/10/2017 1142   AST 22 11/18/2019 1000   AST 21 11/06/2019 1024   AST 43 (H) 07/10/2017 1142   ALT 7 11/18/2019 1000   ALT 8 11/06/2019 1024   ALT 28 07/10/2017 1142   ALKPHOS 126 (H) 11/18/2019 1000   ALKPHOS 93 07/10/2017 1142   BILITOT 0.3 11/18/2019 1000   BILITOT 0.4 11/06/2019 1024   BILITOT 0.56 07/10/2017 1142   GFRNONAA 59 (L) 11/18/2019 1000   GFRNONAA >60 11/06/2019 1024   GFRAA 67 11/18/2019 1000   GFRAA >60 11/06/2019 1024    Assessment: 1.  Upper GI bleed history: 2.  Hypertension: Blood pressure 190/72, on recheck 210/82, other than nightly headaches no chest pain or dizziness-will call her PCP  Plan: 1.  Contacted PCP in regards to patient's elevated blood pressure, it was rechecked a third time and still above 200, PCP advised them to go to the ER urgent  care. 2.  Last hemoglobin check was stable, no further signs of GI bleed. 3.  Continue Pantoprazole 40 mg twice daily indefinitely. 4.  Patient to follow in clinic as needed with [place].  Ellouise Newer, PA-C [facility] 12/04/2019, 2:14 PM  Cc: [name], MD

## 2019-12-04 NOTE — Patient Instructions (Signed)
If you are age 85 or older, your body mass index should be between 23-30. Your Body mass index is 35.7 kg/m. If this is out of the aforementioned range listed, please consider follow up with your Primary Care Provider.  If you are age 52 or younger, your body mass index should be between 19-25. Your Body mass index is 35.7 kg/m. If this is out of the aformentioned range listed, please consider follow up with your Primary Care Provider.   Follow up as needed.

## 2019-12-04 NOTE — ED Triage Notes (Signed)
Patient stttes she was at her 32 office and was sent to the ED for hypertension. Patient did not know what the BP was prior to coming to the ED.  BP in triage-206/101.

## 2019-12-05 ENCOUNTER — Other Ambulatory Visit: Payer: Self-pay | Admitting: Family Medicine

## 2019-12-16 ENCOUNTER — Other Ambulatory Visit: Payer: Medicare Other

## 2019-12-16 ENCOUNTER — Other Ambulatory Visit: Payer: Self-pay

## 2019-12-16 DIAGNOSIS — K317 Polyp of stomach and duodenum: Secondary | ICD-10-CM | POA: Diagnosis not present

## 2019-12-16 DIAGNOSIS — K2961 Other gastritis with bleeding: Secondary | ICD-10-CM | POA: Diagnosis not present

## 2019-12-17 LAB — CBC WITH DIFFERENTIAL/PLATELET
Basophils Absolute: 0 10*3/uL (ref 0.0–0.2)
Basos: 1 %
EOS (ABSOLUTE): 0.1 10*3/uL (ref 0.0–0.4)
Eos: 4 %
Hematocrit: 32.1 % — ABNORMAL LOW (ref 34.0–46.6)
Hemoglobin: 10.2 g/dL — ABNORMAL LOW (ref 11.1–15.9)
Immature Grans (Abs): 0 10*3/uL (ref 0.0–0.1)
Immature Granulocytes: 0 %
Lymphocytes Absolute: 0.8 10*3/uL (ref 0.7–3.1)
Lymphs: 26 %
MCH: 30.2 pg (ref 26.6–33.0)
MCHC: 31.8 g/dL (ref 31.5–35.7)
MCV: 95 fL (ref 79–97)
Monocytes Absolute: 0.4 10*3/uL (ref 0.1–0.9)
Monocytes: 14 %
Neutrophils Absolute: 1.7 10*3/uL (ref 1.4–7.0)
Neutrophils: 55 %
Platelets: 95 10*3/uL — CL (ref 150–450)
RBC: 3.38 x10E6/uL — ABNORMAL LOW (ref 3.77–5.28)
RDW: 14.8 % (ref 11.7–15.4)
WBC: 3.2 10*3/uL — ABNORMAL LOW (ref 3.4–10.8)

## 2019-12-21 ENCOUNTER — Other Ambulatory Visit: Payer: Self-pay | Admitting: Family Medicine

## 2019-12-31 ENCOUNTER — Ambulatory Visit (INDEPENDENT_AMBULATORY_CARE_PROVIDER_SITE_OTHER): Payer: Medicare Other | Admitting: Family Medicine

## 2019-12-31 ENCOUNTER — Encounter: Payer: Self-pay | Admitting: Family Medicine

## 2019-12-31 ENCOUNTER — Other Ambulatory Visit: Payer: Self-pay

## 2019-12-31 VITALS — BP 160/73 | HR 60 | Temp 97.8°F | Ht 64.0 in

## 2019-12-31 DIAGNOSIS — F411 Generalized anxiety disorder: Secondary | ICD-10-CM | POA: Diagnosis not present

## 2019-12-31 DIAGNOSIS — I10 Essential (primary) hypertension: Secondary | ICD-10-CM

## 2019-12-31 DIAGNOSIS — C3492 Malignant neoplasm of unspecified part of left bronchus or lung: Secondary | ICD-10-CM

## 2019-12-31 DIAGNOSIS — F331 Major depressive disorder, recurrent, moderate: Secondary | ICD-10-CM | POA: Diagnosis not present

## 2019-12-31 MED ORDER — QUETIAPINE FUMARATE 200 MG PO TABS: 200.0000 mg | ORAL_TABLET | Freq: Every day | ORAL | 1 refills | Status: DC

## 2019-12-31 MED ORDER — PANTOPRAZOLE SODIUM 40 MG PO TBEC: 40.0000 mg | DELAYED_RELEASE_TABLET | Freq: Two times a day (BID) | ORAL | 2 refills | Status: DC

## 2019-12-31 MED ORDER — AMLODIPINE BESYLATE 5 MG PO TABS: 5.0000 mg | ORAL_TABLET | Freq: Every day | ORAL | 1 refills | Status: DC

## 2019-12-31 NOTE — Progress Notes (Signed)
Subjective:  Patient ID: [name], female    DOB: 1934/07/18  Age: 86 y.o. MRN: [id number]  CC: Follow-up (6 week)   HPI [name] presents for follow-up on her lung cancer.  There is a question of if she was having brain metastases but her husband tells me that his lesions were found to be benign.  She has no new complaints today.   Follow-up of hypertension. Patient has no history of headache chest pain or shortness of breath or recent cough. Patient also denies symptoms of TIA such as numbness weakness lateralizing. Patient checks  blood pressure at home and has not had any elevated readings recently. Patient denies side effects from his medication. States taking it regularly.  Patient in for follow-up of elevated cholesterol. Doing well without complaints on current medication. Denies side effects of statin including myalgia and arthralgia and nausea. Also in today for liver function testing. Currently no chest pain, shortness of breath or other cardiovascular related symptoms noted.  Main concern today is anxiety GAD completed and noted to be maximally positive indicating severe anxiety.  GAD 7 : Generalized Anxiety Score 12/31/2019 11/18/2019  Nervous, Anxious, on Edge 3 3  Control/stop worrying 3 3  Worry too much - different things 3 3  Trouble relaxing 3 3  Restless 3 3  Easily annoyed or irritable 3 3  Afraid - awful might happen 3 3  Total GAD 7 Score 21 21  Anxiety Difficulty Somewhat difficult Very difficult      Depression screen [facility] 2/9 01/04/2020 12/31/2019 11/18/2019  Decreased Interest 2 2 3   Down, Depressed, Hopeless 0 2 3  PHQ - 2 Score 2 4 6   Altered sleeping 1 2 3   Tired, decreased energy 2 2 3   Change in appetite 0 1 0  Feeling bad or failure about yourself  1 1 0  Trouble concentrating 0 1 3  Moving slowly or fidgety/restless 2 1 3   Suicidal thoughts 0 0 0  PHQ-9 Score 8 12 18   Difficult doing work/chores Somewhat difficult Somewhat difficult  Somewhat difficult  Some recent data might be hidden    History Aritza has a past medical history of Acute respiratory failure with hypoxia ([place]) (10/23/2015), Allergic rhinitis, Anxiety, Aortic insufficiency, Arthritis, Ataxia, Back pain (12/13/2016), Bradycardia, Burning tongue syndrome (25 years), Cataract, Cerebellar degeneration ([place]), Chest pain (12/13/2016), Chronic urinary tract infection, Complication of anesthesia, CVA (cerebral infarction) (05/2003), Dehydration with hyponatremia (03/23/2018), Depression, DVT (deep venous thrombosis) ([place]), Encounter for antineoplastic chemotherapy (10/09/2016), Fatigue (01/03/2015), Gait disorder, Gastric polyps, GERD (gastroesophageal reflux disease), Goals of care, counseling/discussion (10/09/2016), H/O: CVA (cerebrovascular accident) (07/14/2016), High cholesterol, History of pericarditis, Hyperlipidemia, Hypotension, Hypothyroidism, IBS (irritable bowel syndrome), Liver cancer ([place]) (dx'd 06/2016), Lung cancer ([place]), Neuroendocrine cancer ([place]) (08/23/2016), Obesity, Paroxysmal atrial fibrillation ([place]), Pericarditis, Personal history of arterial venous malformation (AVM), Pulmonary embolism ([place]), Seizure disorder ([place]), Seizures ([place]) (2003), Shortness of breath dyspnea, Sick sinus syndrome ([place]), Sternum fx (10/27/2013), Stroke ([place]) (5 years ago), Thyroid disease, TIA (transient ischemic attack), and UTI (lower urinary tract infection) (03/27/2016).   She has a past surgical history that includes Knee arthroscopy (Right, 11/14/2006); tibial and fibular internal fixation (Left); Upper gastrointestinal endoscopy (2009, 2013); Colonoscopy (2006, 2009); Appendectomy (86 years old); cyst removed (35 years ago); Total abdominal hysterectomy (86 years old); Knee arthroscopy (Bilateral, 5 and 6 years ago); Knee arthroscopy with lateral menisectomy (07/03/2012); Cardiac catheterization (N/A, 01/06/2015); Eye surgery (Right); Colonoscopy with propofol (N/A, 03/28/2016); IR  Angiogram  Visceral Selective (11/28/2016); IR Angiogram Selective Each Additional Vessel (11/28/2016); IR US Guide Vasc Access Right (11/28/2016); IR Angiogram Selective Each Additional Vessel (11/28/2016); IR Angiogram Visceral Selective (11/28/2016); IR Angiogram Selective Each Additional Vessel (11/28/2016); IR EMBO ARTERIAL NOT HEMORR HEMANG INC GUIDE ROADMAPPING (11/28/2016); IR Angiogram Selective Each Additional Vessel (11/28/2016); IR Angiogram Selective Each Additional Vessel (12/13/2016); IR Angiogram Visceral Selective (12/13/2016); IR Angiogram Selective Each Additional Vessel (12/13/2016); IR US Guide Vasc Access Right (12/13/2016); IR Angiogram Visceral Selective (12/13/2016); IR EMBO TUMOR ORGAN ISCHEMIA INFARCT INC GUIDE ROADMAPPING (12/13/2016); IR Angiogram Selective Each Additional Vessel (01/09/2017); IR EMBO TUMOR ORGAN ISCHEMIA INFARCT INC GUIDE ROADMAPPING (01/09/2017); IR US Guide Vasc Access Right (01/09/2017); IR Angiogram Visceral Selective (01/09/2017); IR Radiologist Eval & Mgmt (11/06/2016); IR Radiologist Eval & Mgmt (01/02/2017); IR IVC FILTER PLMT / S&I /IMG GUID/MOD SED (04/24/2017); IR Radiologist Eval & Mgmt (02/05/2017); IR Radiologist Eval & Mgmt (05/02/2017); IR Radiologist Eval & Mgmt (10/17/2017); CORONARY ANGIOGRAPHY (N/A, 05/22/2018); PACEMAKER IMPLANT (N/A, 05/22/2018); LOOP RECORDER REMOVAL (N/A, 05/22/2018); Esophagogastroduodenoscopy (egd) with propofol (N/A, 11/07/2019); Hemostasis control (11/07/2019); Hemostasis clip placement (11/07/2019); and polypectomy (11/07/2019).   Her family history includes COPD in her daughter; Colon cancer (age of onset: 69) in her brother; Colon polyps in her brother; Coronary artery disease in her brother; Diabetes in her brother and brother; Heart attack (age of onset: 12) in her father; Peptic Ulcer in her mother; Prostate cancer in her brother and son.She reports that she quit smoking about 44 years ago. Her smoking use included cigarettes. She has a 2.50 pack-year  smoking history. She has never used smokeless tobacco. She reports that she does not drink alcohol or use drugs.    ROS Review of Systems  Constitutional: Negative.   HENT: Negative.   Eyes: Negative for visual disturbance.  Respiratory: Negative for shortness of breath.   Cardiovascular: Negative for chest pain.  Gastrointestinal: Negative for abdominal pain.  Musculoskeletal: Negative for arthralgias.  Psychiatric/Behavioral: Positive for agitation and dysphoric mood. The patient is nervous/anxious.     Objective:  BP (!) 160/73   Pulse 60   Temp 97.8 F (36.6 C) (Temporal)   Ht 5\' 4"  (1.626 m)   BMI 35.70 kg/m   BP Readings from Last 3 Encounters:  12/31/19 (!) 160/73  12/04/19 (!) 171/85  12/04/19 (!) 202/78    Wt Readings from Last 3 Encounters:  12/04/19 208 lb (94.3 kg)  12/04/19 208 lb (94.3 kg)  11/10/19 210 lb 3.2 oz (95.3 kg)     Physical Exam Constitutional:      General: She is not in acute distress.    Appearance: She is well-developed.  Cardiovascular:     Rate and Rhythm: Normal rate and regular rhythm.  Pulmonary:     Breath sounds: Normal breath sounds.  Skin:    General: Skin is warm and dry.  Neurological:     Mental Status: She is alert and oriented to person, place, and time.       Assessment & Plan:   [name] was seen today for follow-up.  Diagnoses and all orders for this visit:  Primary malignant neoplasm of left lung metastatic to other site [facility])  GAD (generalized anxiety disorder) -     QUEtiapine (SEROQUEL) 200 MG tablet; Take 1 tablet (200 mg total) by mouth at bedtime.  Moderate episode of recurrent major depressive disorder ([name]) -     QUEtiapine (SEROQUEL) 200 MG tablet; Take 1 tablet (200 mg total) by mouth at bedtime.  Accelerated hypertension -     amLODipine (NORVASC) 5 MG tablet; Take 1 tablet (5 mg total) by mouth daily.  Other orders -     pantoprazole (PROTONIX) 40 MG tablet; Take 1 tablet (40 mg total)  by mouth 2 (two) times daily.       I have changed [place]. Kniss's QUEtiapine and amLODipine. I am also having her maintain her Melatonin, hydroxypropyl methylcellulose / hypromellose, escitalopram, fluticasone, ondansetron, OXcarbazepine, rivaroxaban, silver sulfADIAZINE, capecitabine, temozolomide, furosemide, lovastatin, Synthroid, gabapentin, and pantoprazole.  Allergies as of 12/31/2019      Reactions   Levaquin [levofloxacin] Shortness Of Breath   Lisinopril Cough      Medication List       Accurate as of December 31, 2019 11:59 PM. If you have any questions, ask your nurse or doctor.        amLODipine 5 MG tablet Commonly known as: NORVASC Take 1 tablet (5 mg total) by mouth daily. What changed:   medication strength  how much to take Changed by: [name], MD   capecitabine 500 MG tablet Commonly known as: XELODA TAKE 3 TABLETS (1500MG ) BY MOUTH TWICE DAILY AFTER A MEAL. TAKE ON DAYS 1-14 OF EACH 28 DAY CYCLE   escitalopram 20 MG tablet Commonly known as: LEXAPRO TAKE 1 TABLET AT BEDTIME   fluticasone 50 MCG/ACT nasal spray Commonly known as: FLONASE Place 2 sprays into both nostrils at bedtime.   furosemide 20 MG tablet Commonly known as: LASIX TAKE 1 TABLET DAILY AS NEEDED FOR FLUID RETENTION What changed: See the new instructions.   gabapentin 400 MG capsule Commonly known as: NEURONTIN TAKE 1 TO 2 CAPSULES THREE TIMES A DAY   hydroxypropyl methylcellulose / hypromellose 2.5 % ophthalmic solution Commonly known as: ISOPTO TEARS / GONIOVISC Place 1 drop into both eyes daily.   lovastatin 40 MG tablet Commonly known as: MEVACOR TAKE 1 TABLET AT BEDTIME   Melatonin 3 MG Tbdp Take 3-6 mg by mouth at bedtime as needed. What changed: reasons to take this   ondansetron 8 MG tablet Commonly known as: ZOFRAN Take 1 tablet (8 mg total) by mouth every 8 (eight) hours as needed for nausea or vomiting.   OXcarbazepine 150 MG tablet Commonly known  as: TRILEPTAL Take 1 tablet (150 mg total) by mouth 2 (two) times daily.   pantoprazole 40 MG tablet Commonly known as: PROTONIX Take 1 tablet (40 mg total) by mouth 2 (two) times daily.   QUEtiapine 200 MG tablet Commonly known as: SEROquel Take 1 tablet (200 mg total) by mouth at bedtime. What changed:   medication strength  how much to take Changed by: [name], MD   rivaroxaban 20 MG Tabs tablet Commonly known as: Xarelto TAKE 1 TABLET DAILY WITH SUPPER What changed:   how much to take  how to take this  when to take this  additional instructions   silver sulfADIAZINE 1 % cream Commonly known as: Silvadene Apply 1 application topically daily.   Synthroid 75 MCG tablet Generic drug: levothyroxine TAKE 1 TABLET DAILY BEFORE BREAKFAST   temozolomide 140 MG capsule Commonly known as: TEMODAR TAKE 2 CAPSULES BY MOUTH DAILY FOR 5 DAYS,ON DAYS 10-14 OF EACH 28DAY CYCLE. MAY TAKE ON EMPTY STOMACH AT BEDTIME TO DECREASE NAUSEA & VOMITING        Follow-up: Return in about 6 weeks (around 02/11/2020).  [name], M.D.

## 2020-01-04 ENCOUNTER — Ambulatory Visit (INDEPENDENT_AMBULATORY_CARE_PROVIDER_SITE_OTHER): Payer: Medicare Other | Admitting: *Deleted

## 2020-01-04 DIAGNOSIS — Z0000 Encounter for general adult medical examination without abnormal findings: Secondary | ICD-10-CM | POA: Diagnosis not present

## 2020-01-04 NOTE — Progress Notes (Signed)
MEDICARE ANNUAL WELLNESS VISIT  01/04/2020  Telephone Visit Disclaimer This Medicare AWV was conducted by telephone due to national recommendations for restrictions regarding the COVID-19 Pandemic (e.g. social distancing).  I verified, using two identifiers, that I am speaking with [name] or their authorized healthcare agent. I discussed the limitations, risks, security, and privacy concerns of performing an evaluation and management service by telephone and the potential availability of an in-person appointment in the future. The patient expressed understanding and agreed to proceed.   Subjective:  [name] is a 86 y.o. female patient of Stacks, Cletus Gash, MD who had a Medicare Annual Wellness Visit today via telephone. 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 she has 6 children. she reports that she is socially active and does interact with friends/family regularly. she is not physically active and enjoys playing cards and rolling dice.  Patient Care Team: Rebekah Fraise, MD as PCP - General (Family Medicine) Rebekah Harp, MD as PCP - Cardiology (Cardiology) Rebekah Grayer, MD as PCP - Electrophysiology (Cardiology) Rebekah Bears, MD as Consulting Physician (Oncology) Rebekah Harp, MD as Consulting Physician (Cardiology) Rebekah Maudlin, MD as Consulting Physician (Orthopedic Surgery) Rebekah Partridge, DO as Consulting Physician (Neurology)  Advanced Directives 01/04/2020 12/04/2019 11/06/2019 11/06/2019 09/30/2019 03/05/2019 01/01/2019  Does Patient Have a Medical Advance Directive? Yes No No No Yes Yes Yes  Type of Paramedic of [place];Living will - - - - - Out of facility DNR (pink MOST or yellow form)  Does patient want to make changes to medical advance directive? No - Patient declined - - - - No - Patient declined No - Patient declined  Copy of [place]  in Chart? Yes - validated most recent copy scanned in chart (See row information) - - - - - -  Would patient like information on creating a medical advance directive? - No - Patient declined No - Patient declined - - - No - Patient declined  Pre-existing out of facility DNR order (yellow form or pink MOST form) - - - - - - -    Hospital Utilization Over the Past 12 Months: # of hospitalizations or ER visits: 2 # of surgeries: 0  Review of Systems    Patient reports that her overall health is worse compared to last year because of her cancer.  History obtained from chart review  Patient Reported Readings (BP, Pulse, CBG, Weight, etc) none  Pain Assessment Pain : No/denies pain     Current Medications & Allergies (verified) Allergies as of 01/04/2020      Reactions   Levaquin [levofloxacin] Shortness Of Breath   Lisinopril Cough      Medication List       Accurate as of January 04, 2020 12:43 PM. If you have any questions, ask your nurse or doctor.        amLODipine 5 MG tablet Commonly known as: NORVASC Take 1 tablet (5 mg total) by mouth daily.   capecitabine 500 MG tablet Commonly known as: XELODA TAKE 3 TABLETS (1500MG ) BY MOUTH TWICE DAILY AFTER A MEAL. TAKE ON DAYS 1-14 OF EACH 28 DAY CYCLE   escitalopram 20 MG tablet Commonly known as: LEXAPRO TAKE 1 TABLET AT BEDTIME   fluticasone 50 MCG/ACT nasal spray Commonly known as: FLONASE Place 2 sprays into both nostrils at bedtime.   furosemide 20 MG tablet  Commonly known as: LASIX TAKE 1 TABLET DAILY AS NEEDED FOR FLUID RETENTION What changed: See the new instructions.   gabapentin 400 MG capsule Commonly known as: NEURONTIN TAKE 1 TO 2 CAPSULES THREE TIMES A DAY   hydroxypropyl methylcellulose / hypromellose 2.5 % ophthalmic solution Commonly known as: ISOPTO TEARS / GONIOVISC Place 1 drop into both eyes daily.   lovastatin 40 MG tablet Commonly known as: MEVACOR TAKE 1 TABLET AT BEDTIME   Melatonin 3  MG Tbdp Take 3-6 mg by mouth at bedtime as needed. What changed: reasons to take this   ondansetron 8 MG tablet Commonly known as: ZOFRAN Take 1 tablet (8 mg total) by mouth every 8 (eight) hours as needed for nausea or vomiting.   OXcarbazepine 150 MG tablet Commonly known as: TRILEPTAL Take 1 tablet (150 mg total) by mouth 2 (two) times daily.   pantoprazole 40 MG tablet Commonly known as: PROTONIX Take 1 tablet (40 mg total) by mouth 2 (two) times daily.   QUEtiapine 200 MG tablet Commonly known as: SEROquel Take 1 tablet (200 mg total) by mouth at bedtime.   rivaroxaban 20 MG Tabs tablet Commonly known as: Xarelto TAKE 1 TABLET DAILY WITH SUPPER What changed:   how much to take  how to take this  when to take this  additional instructions   silver sulfADIAZINE 1 % cream Commonly known as: Silvadene Apply 1 application topically daily.   Synthroid 75 MCG tablet Generic drug: levothyroxine TAKE 1 TABLET DAILY BEFORE BREAKFAST   temozolomide 140 MG capsule Commonly known as: TEMODAR TAKE 2 CAPSULES BY MOUTH DAILY FOR 5 DAYS,ON DAYS 10-14 OF EACH 28DAY CYCLE. MAY TAKE ON EMPTY STOMACH AT BEDTIME TO DECREASE NAUSEA & VOMITING       History (reviewed): Past Medical History:  Diagnosis Date  . Acute respiratory failure with hypoxia ([place]) 10/23/2015  . Allergic rhinitis    PT. DENIES  . Anxiety   . Aortic insufficiency    Echo 04/29/2018: EF 65-70, mild AS (mean 13), mod AI, Asc Aorta 42 mm (mildly dilated), mild LAE, PASP 41, pericardium normal in appearance.   . Arthritis    NECK  . Ataxia   . Back pain 12/13/2016  . Bradycardia    primarily nocturnal  . Burning tongue syndrome 25 years  . Cataract   . Cerebellar degeneration ([place])   . Chest pain 12/13/2016   Atypical chest pain  . Chronic urinary tract infection   . Complication of anesthesia    low o2 sats, coded 30 years ago  . CVA (cerebral infarction) 05/2003  . Dehydration with hyponatremia  03/23/2018  . Depression   . DVT (deep venous thrombosis) ([place])   . Encounter for antineoplastic chemotherapy 10/09/2016  . Fatigue 01/03/2015  . Gait disorder   . Gastric polyps   . GERD (gastroesophageal reflux disease)   . Goals of care, counseling/discussion 10/09/2016  . H/O: CVA (cerebrovascular accident) 07/14/2016  . High cholesterol   . History of pericarditis   . Hyperlipidemia   . Hypotension   . Hypothyroidism   . IBS (irritable bowel syndrome)   . Liver cancer ([place]) dx'd 06/2016   liver  . Lung cancer ([place])   . Neuroendocrine cancer ([place]) 08/23/2016  . Obesity   . Paroxysmal atrial fibrillation ([name])    chads2vasc score is 6,  she is felt to be a poor candidate for anticoagulation  . Pericarditis   . Personal history of arterial venous malformation (AVM)  right side of face  . Pulmonary embolism ([place])   . Seizure disorder ([place])   . Seizures ([place]) 2003   " smelling"- Gabapentin "no problem"  . Shortness of breath dyspnea    with exertion  . Sick sinus syndrome ([place])   . Sternum fx 10/27/2013  . Stroke [facility]) 5 years ago   Right side of face weak, slurred speach-   . Thyroid disease   . TIA (transient ischemic attack)   . UTI (lower urinary tract infection) 03/27/2016   "frequently"   Past Surgical History:  Procedure Laterality Date  . APPENDECTOMY  86 years old  . COLONOSCOPY  2006, 2009  . COLONOSCOPY WITH PROPOFOL N/A 03/28/2016   Procedure: COLONOSCOPY WITH PROPOFOL;  Surgeon: Gatha Mayer, MD;  Location: [facility];  Service: Endoscopy;  Laterality: N/A;  . CORONARY ANGIOGRAPHY N/A 05/22/2018   Procedure: CORONARY ANGIOGRAPHY (CATH LAB);  Surgeon: Rebekah Harp, MD;  Location: [place] [facility];  Service: Cardiovascular;  Laterality: N/A;  . cyst removed  35 years ago  . EP IMPLANTABLE DEVICE N/A 01/06/2015   Procedure: Loop Recorder Insertion;  Surgeon: Rebekah Grayer, MD;  Location: [place] [facility];  Service: Cardiovascular;  Laterality: N/A;  .  ESOPHAGOGASTRODUODENOSCOPY (EGD) WITH PROPOFOL N/A 11/07/2019   Procedure: ESOPHAGOGASTRODUODENOSCOPY (EGD) WITH PROPOFOL;  Surgeon: Yetta Flock, MD;  Location: [facility];  Service: Gastroenterology;  Laterality: N/A;  . EYE SURGERY Right    Cataract  . HEMOSTASIS CLIP PLACEMENT  11/07/2019   Procedure: HEMOSTASIS CLIP PLACEMENT;  Surgeon: Yetta Flock, MD;  [facility]: [facility];  Service: Gastroenterology;;  . HEMOSTASIS CONTROL  11/07/2019   Procedure: HEMOSTASIS CONTROL;  Surgeon: Yetta Flock, MD;  [facility];  Service: Gastroenterology;;  . Chrystine Oiler SELECTIVE EACH ADDITIONAL VESSEL  11/28/2016  . IR ANGIOGRAM SELECTIVE EACH ADDITIONAL VESSEL  11/28/2016  . IR ANGIOGRAM SELECTIVE EACH ADDITIONAL VESSEL  11/28/2016  . IR ANGIOGRAM SELECTIVE EACH ADDITIONAL VESSEL  11/28/2016  . IR ANGIOGRAM SELECTIVE EACH ADDITIONAL VESSEL  12/13/2016  . IR ANGIOGRAM SELECTIVE EACH ADDITIONAL VESSEL  12/13/2016  . IR ANGIOGRAM SELECTIVE EACH ADDITIONAL VESSEL  01/09/2017  . IR ANGIOGRAM VISCERAL SELECTIVE  11/28/2016  . IR ANGIOGRAM VISCERAL SELECTIVE  11/28/2016  . IR ANGIOGRAM VISCERAL SELECTIVE  12/13/2016  . IR ANGIOGRAM VISCERAL SELECTIVE  12/13/2016  . IR ANGIOGRAM VISCERAL SELECTIVE  01/09/2017  . IR EMBO ARTERIAL NOT HEMORR HEMANG INC GUIDE ROADMAPPING  11/28/2016  . IR EMBO TUMOR ORGAN ISCHEMIA INFARCT INC GUIDE ROADMAPPING  12/13/2016  . IR EMBO TUMOR ORGAN ISCHEMIA INFARCT INC GUIDE ROADMAPPING  01/09/2017  . IR IVC FILTER PLMT / S&I /IMG GUID/MOD SED  04/24/2017  . IR RADIOLOGIST EVAL & MGMT  11/06/2016  . IR RADIOLOGIST EVAL & MGMT  01/02/2017  . IR RADIOLOGIST EVAL & MGMT  02/05/2017  . IR RADIOLOGIST EVAL & MGMT  05/02/2017  . IR RADIOLOGIST EVAL & MGMT  10/17/2017  . IR US GUIDE VASC ACCESS RIGHT  11/28/2016  . IR US GUIDE VASC ACCESS RIGHT  12/13/2016  . IR US GUIDE VASC ACCESS RIGHT  01/09/2017  . KNEE ARTHROSCOPY Right 11/14/2006  . KNEE ARTHROSCOPY Bilateral 5 and 6  years ago  . KNEE ARTHROSCOPY WITH LATERAL MENISECTOMY  07/03/2012   Procedure: KNEE ARTHROSCOPY WITH LATERAL MENISECTOMY;  Surgeon: Magnus Sinning, MD;  [facility];  Service: Orthopedics;  Laterality: Left;  with Partial Lateral Menisectomy and Medial Menisectomy. Shaving of medial  and lateral femoral condyles. Shaving of patella. Removal of a loose body  . LOOP RECORDER REMOVAL N/A 05/22/2018   Procedure: LOOP RECORDER REMOVAL;  Surgeon: Rebekah Grayer, MD;  Location: [facility];  Service: Cardiovascular;  Laterality: N/A;  . PACEMAKER IMPLANT N/A 05/22/2018   MDT Azure XT DR MRI implanted by Dr Rayann Heman for sick sinus syndrome  . POLYPECTOMY  11/07/2019   Procedure: POLYPECTOMY;  Surgeon: Yetta Flock, MD;  [facility];  Service: Gastroenterology;;  . tibial and fibular internal fixation Left   . TOTAL ABDOMINAL HYSTERECTOMY  86 years old  . UPPER GASTROINTESTINAL ENDOSCOPY  2009, 2013   Family History  Problem Relation Age of Onset  . Heart attack Father 44       fatal  . Peptic Ulcer Mother   . Coronary artery disease Brother   . Diabetes Brother   . Colon cancer Brother 101  . Colon polyps Brother   . Prostate cancer Brother   . Diabetes Brother   . Prostate cancer Son   . COPD Daughter    Social History   Socioeconomic History  . Marital status: Married    Spouse name: Tecla Mailloux  . Number of children: 6  . Years of education: 80  . Highest education level: 10th grade  Occupational History  . Occupation: retired  Tobacco Use  . Smoking status: Former Smoker    Packs/day: 0.25    Years: 10.00    Pack years: 2.50    Types: Cigarettes    Quit date: 07/31/1975    Years since quitting: 44.4  . Smokeless tobacco: Never Used  Substance and Sexual Activity  . Alcohol use: No  . Drug use: No  . Sexual activity: Not Currently  Other Topics Concern  . Not on file  Social History Narrative   Lives with husband, does have stairs, does not  use them. Pt completed 10th grade.   Right handed   Social Determinants of Health   Financial Resource Strain: Low Risk   . Difficulty of Paying Living Expenses: Not hard at all  Food Insecurity: No Food Insecurity  . Worried About Charity fundraiser in the Last Year: Never true  . Ran Out of Food in the Last Year: Never true  Transportation Needs: No Transportation Needs  . Lack of Transportation (Medical): No  . Lack of Transportation (Non-Medical): No  Physical Activity: Inactive  . Days of Exercise per Week: 0 days  . Minutes of Exercise per Session: 0 min  Stress: No Stress Concern Present  . Feeling of Stress : Only a little  Social Connections: Somewhat Isolated  . Frequency of Communication with Friends and Family: More than three times a week  . Frequency of Social Gatherings with Friends and Family: More than three times a week  . Attends Religious Services: Never  . Active Member of Clubs or Organizations: No  . Attends Archivist Meetings: Never  . Marital Status: Married    Activities of Daily Living In your present state of health, do you have any difficulty performing the following activities: 01/04/2020 11/06/2019  Hearing? N -  Vision? Y -  Comment double vision, blurred vision even with glasses -  Difficulty concentrating or making decisions? Y -  Comment her husband makes all of her decisions -  Walking or climbing stairs? Y -  Comment pt is non-ambulatory -  Dressing or bathing? Y -  Comment her husband does her dressing  and bathing -  Doing errands, shopping? Tempie Donning  Comment husband does all the errands and takes her to all appointments -  Preparing Food and eating ? Y -  Comment she can feed herself but her husband prepares all meals -  Using the Toilet? Y -  Comment husband helps her to the toilet using the hoya lift -  In the past six months, have you accidently leaked urine? Y -  Comment wears depends -  Do you have problems with loss of  bowel control? N -  Managing your Medications? Y -  Comment husband gives her all of her medications -  Managing your Finances? Y -  Comment husband takes care of all the finances -  Housekeeping or managing your Housekeeping? Y -  Comment husband does all the housekeeping -  Some recent data might be hidden    Patient Education/ Literacy How often do you need to have someone help you when you read instructions, pamphlets, or other written materials from your doctor or pharmacy?: 1 - Never What is the last grade level you completed in school?: 10th grade  Exercise Current Exercise Habits: The patient does not participate in regular exercise at present, Exercise limited by: orthopedic condition(s);respiratory conditions(s);cardiac condition(s);neurologic condition(s)  Diet Patient reports consuming 2 meals a day and 1 snack(s) a day Patient reports that her primary diet is: Regular Patient reports that she does have regular access to food.   Depression Screen PHQ 2/9 Scores 01/04/2020 12/31/2019 11/18/2019 07/01/2019 06/17/2019 06/12/2019 06/10/2019  PHQ - 2 Score 2 4 6  0 0 0 0  PHQ- 9 Score 8 12 18  - - - -  Exception Documentation - - - - - - -     Fall Risk Fall Risk  01/04/2020 12/31/2019 11/18/2019 09/30/2019 07/01/2019  Falls in the past year? 0 0 0 0 1  Number falls in past yr: - 0 0 0 0  Injury with Fall? - 0 0 0 1  Comment - - - - Skin tear on lower leg.  Risk Factor Category  - - - - -  Risk for fall due to : - Impaired balance/gait;Impaired mobility Impaired balance/gait;Impaired mobility;Impaired vision - History of fall(s);Impaired mobility  Follow up - Falls evaluation completed Falls evaluation completed - Falls prevention discussed     Objective:  [name] seemed alert and oriented and she participated appropriately during our telephone visit.  Blood Pressure Weight BMI  BP Readings from Last 3 Encounters:  12/31/19 (!) 160/73  12/04/19 (!) 171/85  12/04/19 (!)  202/78   Wt Readings from Last 3 Encounters:  12/04/19 208 lb (94.3 kg)  12/04/19 208 lb (94.3 kg)  11/10/19 210 lb 3.2 oz (95.3 kg)   BMI Readings from Last 1 Encounters:  12/31/19 35.70 kg/m    *Unable to obtain current vital signs, weight, and BMI due to telephone visit type  Hearing/Vision  . Crissa did not seem to have difficulty with hearing/understanding during the telephone conversation . Reports that she has not had a formal eye exam by an eye care professional within the past year . Reports that she has not had a formal hearing evaluation within the past year *Unable to fully assess hearing and vision during telephone visit type  Cognitive Function: 6CIT Screen 01/01/2019  What Year? 4 points  What month? 0 points  What time? 0 points  Count back from 20 4 points  Months in reverse 4 points  Repeat phrase 6  points  Total Score 18   (Normal:0-7, Significant for Dysfunction: >8)  Normal Cognitive Function Screening: Last year she had an abnormal screening and pt declined the screening today over the phone.   Immunization & Health Maintenance Record Immunization History  Administered Date(s) Administered  . Fluad Quad(high Dose 65+) 05/26/2019  . Influenza, High Dose Seasonal PF 04/25/2017, 04/23/2018  . Influenza,inj,Quad PF,6+ Mos 05/27/2013, 05/03/2014, 05/24/2015, 05/01/2016  . Moderna SARS-COVID-2 Vaccination 09/04/2019, 10/03/2019  . Pneumococcal Conjugate-13 05/24/2015  . Pneumococcal Polysaccharide-23 11/27/2017  . Td 02/27/2005  . Tdap 11/27/2017  . Zoster 10/12/2014    Health Maintenance  Topic Date Due  . DEXA SCAN  Never done  . INFLUENZA VACCINE  02/28/2020  . TETANUS/TDAP  11/28/2027  . COVID-19 Vaccine  Completed  . PNA vac Low Risk Adult  Completed       Assessment  This is a routine wellness examination for [name].  Health Maintenance: Due or Overdue Health Maintenance Due  Topic Date Due  . DEXA SCAN  Never done     [name] does not need a referral for Community Assistance: Care Management:   no Social Work:    no Prescription Assistance:  no Nutrition/Diabetes Education:  no   Plan:  Personalized Goals Goals Addressed            This Visit's Progress   . Prevent falls        Personalized Health Maintenance & Screening Recommendations  She is up to date on all recommended health maintenance.  Lung Cancer Screening Recommended: no (Low Dose CT Chest recommended if Age 39-80 years, 30 pack-year currently smoking OR have quit w/in past 15 years) Hepatitis C Screening recommended: no HIV Screening recommended: no  Advanced Directives: Written information was not prepared per patient's request.  Referrals & Orders No orders of the defined types were placed in this encounter.   Follow-up Plan . Follow-up with Rebekah Fraise, MD as planned   I have personally reviewed and noted the following in the patient's chart:   . Medical and social history . Use of alcohol, tobacco or illicit drugs  . Current medications and supplements . Functional ability and status . Nutritional status . Physical activity . Advanced directives . List of other physicians . Hospitalizations, surgeries, and ER visits in previous 12 months . Vitals . Screenings to include cognitive, depression, and falls . Referrals and appointments  In addition, I have reviewed and discussed with [name] certain preventive protocols, quality metrics, and best practice recommendations. A written personalized care plan for preventive services as well as general preventive health recommendations is available and can be mailed to the patient at her request.      Milas Hock, LPN  03/03/6313

## 2020-01-04 NOTE — Patient Instructions (Signed)
Preventive Care 38 Years and Older, Female Preventive care refers to lifestyle choices and visits with your health care provider that can promote health and wellness. This includes:  A yearly physical exam. This is also called an annual well check.  Regular dental and eye exams.  Immunizations.  Screening for certain conditions.  Healthy lifestyle choices, such as diet and exercise. What can I expect for my preventive care visit? Physical exam Your health care provider will check:  Height and weight. These may be used to calculate body mass index (BMI), which is a measurement that tells if you are at a healthy weight.  Heart rate and blood pressure.  Your skin for abnormal spots. Counseling Your health care provider may ask you questions about:  Alcohol, tobacco, and drug use.  Emotional well-being.  Home and relationship well-being.  Sexual activity.  Eating habits.  History of falls.  Memory and ability to understand (cognition).  Work and work Statistician.  Pregnancy and menstrual history. What immunizations do I need?  Influenza (flu) vaccine  This is recommended every year. Tetanus, diphtheria, and pertussis (Tdap) vaccine  You may need a Td booster every 10 years. Varicella (chickenpox) vaccine  You may need this vaccine if you have not already been vaccinated. Zoster (shingles) vaccine  You may need this after age 33. Pneumococcal conjugate (PCV13) vaccine  One dose is recommended after age 33. Pneumococcal polysaccharide (PPSV23) vaccine  One dose is recommended after age 72. Measles, mumps, and rubella (MMR) vaccine  You may need at least one dose of MMR if you were born in 1957 or later. You may also need a second dose. Meningococcal conjugate (MenACWY) vaccine  You may need this if you have certain conditions. Hepatitis A vaccine  You may need this if you have certain conditions or if you travel or work in places where you may be exposed  to hepatitis A. Hepatitis B vaccine  You may need this if you have certain conditions or if you travel or work in places where you may be exposed to hepatitis B. Haemophilus influenzae type b (Hib) vaccine  You may need this if you have certain conditions. You may receive vaccines as individual doses or as more than one vaccine together in one shot (combination vaccines). Talk with your health care provider about the risks and benefits of combination vaccines. What tests do I need? Blood tests  Lipid and cholesterol levels. These may be checked every 5 years, or more frequently depending on your overall health.  Hepatitis C test.  Hepatitis B test. Screening  Lung cancer screening. You may have this screening every year starting at age 39 if you have a 30-pack-year history of smoking and currently smoke or have quit within the past 15 years.  Colorectal cancer screening. All adults should have this screening starting at age 36 and continuing until age 15. Your health care provider may recommend screening at age 23 if you are at increased risk. You will have tests every 1-10 years, depending on your results and the type of screening test.  Diabetes screening. This is done by checking your blood sugar (glucose) after you have not eaten for a while (fasting). You may have this done every 1-3 years.  Mammogram. This may be done every 1-2 years. Talk with your health care provider about how often you should have regular mammograms.  BRCA-related cancer screening. This may be done if you have a family history of breast, ovarian, tubal, or peritoneal cancers.  Other tests  Sexually transmitted disease (STD) testing.  Bone density scan. This is done to screen for osteoporosis. You may have this done starting at age 44. Follow these instructions at home: Eating and drinking  Eat a diet that includes fresh fruits and vegetables, whole grains, lean protein, and low-fat dairy products. Limit  your intake of foods with high amounts of sugar, saturated fats, and salt.  Take vitamin and mineral supplements as recommended by your health care provider.  Do not drink alcohol if your health care provider tells you not to drink.  If you drink alcohol: ? Limit how much you have to 0-1 drink a day. ? Be aware of how much alcohol is in your drink. In the [place] one drink equals one 12 oz bottle of beer (355 mL), one 5 oz glass of wine (148 mL), or one 1 oz glass of hard liquor (44 mL). Lifestyle  Take daily care of your teeth and gums.  Stay active. Exercise for at least 30 minutes on 5 or more days each week.  Do not use any products that contain nicotine or tobacco, such as cigarettes, e-cigarettes, and chewing tobacco. If you need help quitting, ask your health care provider.  If you are sexually active, practice safe sex. Use a condom or other form of protection in order to prevent STIs (sexually transmitted infections).  Talk with your health care provider about taking a low-dose aspirin or statin. What's next?  Go to your health care provider once a year for a well check visit.  Ask your health care provider how often you should have your eyes and teeth checked.  Stay up to date on all vaccines. This information is not intended to replace advice given to you by your health care provider. Make sure you discuss any questions you have with your health care provider. Document Revised: 07/10/2018 Document Reviewed: 07/10/2018 Elsevier Patient Education  2020 [employer].

## 2020-01-08 ENCOUNTER — Telehealth: Payer: Self-pay

## 2020-01-08 NOTE — Telephone Encounter (Signed)
Received voicemail stating that patient is requesting medication for severe anxiety. PCP will not prescribe. Sent Dr [name] a staff message to make him aware of patients request.

## 2020-01-08 NOTE — Telephone Encounter (Signed)
TC to patient after discussing with [name] PA in regard to her requesting medication for severe anxiety from Dr [name] since her PCP will not prescribe. Let patient know that Dr [name] can also not prescribe her any anxiety medication since he is not evaluating her for anxiety and depression. In her PCP's doctor note from visit on 6/3 they discussed it being her main concern and her PCP switched her medications around. Spoke with patients husband who verbalized understanding. No further problems or concerns at this time.

## 2020-01-09 ENCOUNTER — Other Ambulatory Visit: Payer: Self-pay | Admitting: Internal Medicine

## 2020-01-09 MED ORDER — ALPRAZOLAM 0.25 MG PO TABS: 0.2500 mg | ORAL_TABLET | Freq: Every evening | ORAL | 0 refills | Status: DC | PRN

## 2020-02-05 ENCOUNTER — Encounter (HOSPITAL_COMMUNITY): Payer: Self-pay

## 2020-02-05 ENCOUNTER — Ambulatory Visit (HOSPITAL_COMMUNITY)
Admission: RE | Admit: 2020-02-05 | Discharge: 2020-02-05 | Disposition: A | Discharge status: Home / Self Care | Payer: Medicare Other | Source: Ambulatory Visit | Attending: Internal Medicine | Admitting: Internal Medicine

## 2020-02-05 ENCOUNTER — Inpatient Hospital Stay: Payer: Medicare Other | Attending: Internal Medicine

## 2020-02-05 ENCOUNTER — Other Ambulatory Visit: Payer: Self-pay

## 2020-02-05 DIAGNOSIS — C7A8 Other malignant neuroendocrine tumors: Secondary | ICD-10-CM | POA: Diagnosis present

## 2020-02-05 DIAGNOSIS — C7A090 Malignant carcinoid tumor of the bronchus and lung: Secondary | ICD-10-CM | POA: Diagnosis not present

## 2020-02-05 DIAGNOSIS — I10 Essential (primary) hypertension: Secondary | ICD-10-CM | POA: Diagnosis not present

## 2020-02-05 DIAGNOSIS — F419 Anxiety disorder, unspecified: Secondary | ICD-10-CM | POA: Diagnosis not present

## 2020-02-05 DIAGNOSIS — C7B02 Secondary carcinoid tumors of liver: Secondary | ICD-10-CM | POA: Diagnosis present

## 2020-02-05 DIAGNOSIS — C7B8 Other secondary neuroendocrine tumors: Secondary | ICD-10-CM | POA: Diagnosis present

## 2020-02-05 DIAGNOSIS — F329 Major depressive disorder, single episode, unspecified: Secondary | ICD-10-CM | POA: Insufficient documentation

## 2020-02-05 DIAGNOSIS — Z79899 Other long term (current) drug therapy: Secondary | ICD-10-CM | POA: Insufficient documentation

## 2020-02-05 DIAGNOSIS — D509 Iron deficiency anemia, unspecified: Secondary | ICD-10-CM | POA: Insufficient documentation

## 2020-02-05 LAB — CMP (CANCER CENTER ONLY)
ALT: 12 U/L (ref 0–44)
AST: 23 U/L (ref 15–41)
Albumin: 3.4 g/dL — ABNORMAL LOW (ref 3.5–5.0)
Alkaline Phosphatase: 113 U/L (ref 38–126)
Anion gap: 8 (ref 5–15)
BUN: 16 mg/dL (ref 8–23)
CO2: 25 mmol/L (ref 22–32)
Calcium: 9.7 mg/dL (ref 8.9–10.3)
Chloride: 105 mmol/L (ref 98–111)
Creatinine: 0.93 mg/dL (ref 0.44–1.00)
GFR, Est AFR Am: >60 mL/min (ref >60)
GFR, Estimated: 57 mL/min — ABNORMAL LOW (ref >60)
Glucose, Bld: 96 mg/dL (ref 70–99)
Potassium: 4.3 mmol/L (ref 3.5–5.1)
Sodium: 138 mmol/L (ref 135–145)
Total Bilirubin: 0.4 mg/dL (ref 0.3–1.2)
Total Protein: 6.9 g/dL (ref 6.5–8.1)

## 2020-02-05 LAB — CBC WITH DIFFERENTIAL (CANCER CENTER ONLY)
Abs Immature Granulocytes: 0.01 10*3/uL (ref 0.00–0.07)
Basophils Absolute: 0 10*3/uL (ref 0.0–0.1)
Basophils Relative: 1 %
Eosinophils Absolute: 0.1 10*3/uL (ref 0.0–0.5)
Eosinophils Relative: 5 %
HCT: 34.3 % — ABNORMAL LOW (ref 36.0–46.0)
Hemoglobin: 10.4 g/dL — ABNORMAL LOW (ref 12.0–15.0)
Immature Granulocytes: 0 %
Lymphocytes Relative: 23 %
Lymphs Abs: 0.7 10*3/uL (ref 0.7–4.0)
MCH: 27.2 pg (ref 26.0–34.0)
MCHC: 30.3 g/dL (ref 30.0–36.0)
MCV: 89.8 fL (ref 80.0–100.0)
Monocytes Absolute: 0.4 10*3/uL (ref 0.1–1.0)
Monocytes Relative: 15 %
Neutro Abs: 1.7 10*3/uL (ref 1.7–7.7)
Neutrophils Relative %: 56 %
Platelet Count: 76 10*3/uL — ABNORMAL LOW (ref 150–400)
RBC: 3.82 MIL/uL — ABNORMAL LOW (ref 3.87–5.11)
RDW: 16.4 % — ABNORMAL HIGH (ref 11.5–15.5)
WBC Count: 3 10*3/uL — ABNORMAL LOW (ref 4.0–10.5)
nRBC: 0 % (ref 0.0–0.2)

## 2020-02-05 MED ORDER — IOHEXOL 300 MG/ML  SOLN
100.0000 mL | Freq: Once | INTRAMUSCULAR | Status: AC | PRN
  Administered 2020-02-05: 100 mL via INTRAVENOUS

## 2020-02-05 MED ORDER — SODIUM CHLORIDE (PF) 0.9 % IJ SOLN
INTRAMUSCULAR | Status: AC
  Filled 2020-02-05: qty 50

## 2020-02-08 ENCOUNTER — Other Ambulatory Visit: Payer: Medicare Other

## 2020-02-09 ENCOUNTER — Inpatient Hospital Stay (HOSPITAL_BASED_OUTPATIENT_CLINIC_OR_DEPARTMENT_OTHER): Payer: Medicare Other | Admitting: Internal Medicine

## 2020-02-09 ENCOUNTER — Other Ambulatory Visit: Payer: Self-pay

## 2020-02-09 ENCOUNTER — Encounter: Payer: Self-pay | Admitting: Internal Medicine

## 2020-02-09 VITALS — BP 166/60 | HR 60 | Temp 97.7°F | Resp 20 | Ht 64.0 in | Wt 200.1 lb

## 2020-02-09 DIAGNOSIS — C787 Secondary malignant neoplasm of liver and intrahepatic bile duct: Secondary | ICD-10-CM

## 2020-02-09 DIAGNOSIS — C7B8 Other secondary neuroendocrine tumors: Secondary | ICD-10-CM

## 2020-02-09 DIAGNOSIS — C7A8 Other malignant neuroendocrine tumors: Secondary | ICD-10-CM

## 2020-02-09 DIAGNOSIS — C3490 Malignant neoplasm of unspecified part of unspecified bronchus or lung: Secondary | ICD-10-CM

## 2020-02-09 DIAGNOSIS — I10 Essential (primary) hypertension: Secondary | ICD-10-CM

## 2020-02-09 DIAGNOSIS — C7A090 Malignant carcinoid tumor of the bronchus and lung: Secondary | ICD-10-CM | POA: Diagnosis not present

## 2020-02-09 DIAGNOSIS — D638 Anemia in other chronic diseases classified elsewhere: Secondary | ICD-10-CM

## 2020-02-09 DIAGNOSIS — C3491 Malignant neoplasm of unspecified part of right bronchus or lung: Secondary | ICD-10-CM | POA: Diagnosis not present

## 2020-02-09 NOTE — Progress Notes (Signed)
[place] [phone]   [phone] [place], [place] [phone]  DIAGNOSIS:  1) Metastatic intermediate. Neuroendocrine tumor of lung primary diagnosed in January 2018 and presented with small bilateral pulmonary nodules in addition to multiple liver metastasis. 2) right lower lobe pulmonary embolism diagnosed incidentally on CT scan of the chest on 04/23/2017  PRIOR THERAPY:  1) Status post radio embolization with Y 90 to the liver lesions by interventional radiology. 2) status post IVC filter placement by interventional radiology on 04/24/2017  CURRENT THERAPY: Xeloda 750 MG/M2 twice a day days 1-14 and Temodar 150 MG/M2 days 10-14 every 4 weeks. Status post 43 cycles.  Her treatment will be on hold for the next few months because of intolerance.  INTERVAL HISTORY: [name] 86 y.o. female returns to the clinic today for follow-up visit accompanied by her husband.  The patient is feeling fine today with no concerning complaints except for the fatigue and generalized weakness.  She was seen recently by her primary care physician for evaluation of the hypertension and for some reason he told her that she has a an expected survival of 6 months and he is encouraging her to consider hospice.  The patient and her husband were a little bit upset with his discussion.  She denied having any current chest pain but has shortness of breath with exertion with no cough or hemoptysis.  She denied having any fever or chills.  She has no nausea, vomiting, diarrhea or constipation.  She has no headache or visual changes.  She is currently off treatment and feeling well.  She had repeat CT scan of the chest, abdomen pelvis performed recently and she is here for evaluation and discussion of her risk her results.   MEDICAL HISTORY: Past Medical History:  Diagnosis Date  . Acute respiratory failure with hypoxia  ([place]) 10/23/2015  . Allergic rhinitis    PT. DENIES  . Anxiety   . Aortic insufficiency    Echo 04/29/2018: EF 65-70, mild AS (mean 13), mod AI, Asc Aorta 42 mm (mildly dilated), mild LAE, PASP 41, pericardium normal in appearance.   . Arthritis    NECK  . Ataxia   . Back pain 12/13/2016  . Bradycardia    primarily nocturnal  . Burning tongue syndrome 25 years  . Cataract   . Cerebellar degeneration ([place])   . Chest pain 12/13/2016   Atypical chest pain  . Chronic urinary tract infection   . Complication of anesthesia    low o2 sats, coded 30 years ago  . CVA (cerebral infarction) 05/2003  . Dehydration with hyponatremia 03/23/2018  . Depression   . DVT (deep venous thrombosis) ([place])   . Encounter for antineoplastic chemotherapy 10/09/2016  . Fatigue 01/03/2015  . Gait disorder   . Gastric polyps   . GERD (gastroesophageal reflux disease)   . Goals of care, counseling/discussion 10/09/2016  . H/O: CVA (cerebrovascular accident) 07/14/2016  . High cholesterol   . History of pericarditis   . Hyperlipidemia   . Hypotension   . Hypothyroidism   . IBS (irritable bowel syndrome)   . Liver cancer ([place]) dx'd 06/2016   liver  . Lung cancer ([place])   . Neuroendocrine cancer ([place]) 08/23/2016  . Obesity   . Paroxysmal atrial fibrillation ([name])    chads2vasc score is 6,  she is felt to be a poor candidate for anticoagulation  .  Pericarditis   . Personal history of arterial venous malformation (AVM)    right side of face  . Pulmonary embolism ([place])   . Seizure disorder ([place])   . Seizures ([facility]) 2003   " smelling"- Gabapentin "no problem"  . Shortness of breath dyspnea    with exertion  . Sick sinus syndrome ([place])   . Sternum fx 10/27/2013  . Stroke [facility]) 5 years ago   Right side of face weak, slurred speach-   . Thyroid disease   . TIA (transient ischemic attack)   . UTI (lower urinary tract infection) 03/27/2016   "frequently"    ALLERGIES:  is allergic to levaquin [levofloxacin]  and lisinopril.  MEDICATIONS:  Current Outpatient Medications  Medication Sig Dispense Refill  . ALPRAZolam (XANAX) 0.25 MG tablet Take 1 tablet (0.25 mg total) by mouth at bedtime as needed for anxiety. 20 tablet 0  . amLODipine (NORVASC) 5 MG tablet Take 1 tablet (5 mg total) by mouth daily. 90 tablet 1  . capecitabine (XELODA) 500 MG tablet TAKE 3 TABLETS (1500MG ) BY MOUTH TWICE DAILY AFTER A MEAL. TAKE ON DAYS 1-14 OF EACH 28 DAY CYCLE 84 tablet 2  . escitalopram (LEXAPRO) 20 MG tablet TAKE 1 TABLET AT BEDTIME (Patient taking differently: Take 20 mg by mouth at bedtime. ) 90 tablet 3  . fluticasone (FLONASE) 50 MCG/ACT nasal spray Place 2 sprays into both nostrils at bedtime. 48 g 3  . furosemide (LASIX) 20 MG tablet TAKE 1 TABLET DAILY AS NEEDED FOR FLUID RETENTION (Patient taking differently: Take 20 mg by mouth daily as needed for fluid. ) 90 tablet 3  . gabapentin (NEURONTIN) 400 MG capsule TAKE 1 TO 2 CAPSULES THREE TIMES A DAY 720 capsule 0  . hydroxypropyl methylcellulose / hypromellose (ISOPTO TEARS / GONIOVISC) 2.5 % ophthalmic solution Place 1 drop into both eyes daily.     Marland Kitchen lovastatin (MEVACOR) 40 MG tablet TAKE 1 TABLET AT BEDTIME (Patient taking differently: Take 40 mg by mouth at bedtime. ) 90 tablet 3  . Melatonin 3 MG TBDP Take 3-6 mg by mouth at bedtime as needed. (Patient taking differently: Take 3-6 mg by mouth at bedtime as needed (sleep). ) 60 tablet 1  . ondansetron (ZOFRAN) 8 MG tablet Take 1 tablet (8 mg total) by mouth every 8 (eight) hours as needed for nausea or vomiting. 90 tablet 1  . OXcarbazepine (TRILEPTAL) 150 MG tablet Take 1 tablet (150 mg total) by mouth 2 (two) times daily. 180 tablet 3  . pantoprazole (PROTONIX) 40 MG tablet Take 1 tablet (40 mg total) by mouth 2 (two) times daily. 180 tablet 2  . QUEtiapine (SEROQUEL) 200 MG tablet Take 1 tablet (200 mg total) by mouth at bedtime. 90 tablet 1  . rivaroxaban (XARELTO) 20 MG TABS tablet TAKE 1 TABLET DAILY  WITH SUPPER (Patient taking differently: Take 20 mg by mouth daily with supper. ) 90 tablet 3  . silver sulfADIAZINE (SILVADENE) 1 % cream Apply 1 application topically daily. 400 g 0  . SYNTHROID 75 MCG tablet TAKE 1 TABLET DAILY BEFORE BREAKFAST 90 tablet 3  . temozolomide (TEMODAR) 140 MG capsule TAKE 2 CAPSULES BY MOUTH DAILY FOR 5 DAYS,ON DAYS 10-14 OF EACH 28DAY CYCLE. MAY TAKE ON EMPTY STOMACH AT BEDTIME TO DECREASE NAUSEA & VOMITING 10 capsule 2   No current facility-administered medications for this visit.   Facility-Administered Medications Ordered in Other Visits  Medication Dose Route Frequency Provider Last Rate Last Admin  .  heparin lock flush 100 unit/mL  500 Units Intracatheter Daily PRN Heilingoetter, Cassandra L, PA-C        SURGICAL HISTORY:  Past Surgical History:  Procedure Laterality Date  . APPENDECTOMY  86 years old  . COLONOSCOPY  2006, 2009  . COLONOSCOPY WITH PROPOFOL N/A 03/28/2016   Procedure: COLONOSCOPY WITH PROPOFOL;  Surgeon: Gatha Mayer, MD;  Location: [facility];  Service: Endoscopy;  Laterality: N/A;  . CORONARY ANGIOGRAPHY N/A 05/22/2018   Procedure: CORONARY ANGIOGRAPHY (CATH LAB);  Surgeon: Lorretta Harp, MD;  Location: [facility];  Service: Cardiovascular;  Laterality: N/A;  . cyst removed  35 years ago  . EP IMPLANTABLE DEVICE N/A 01/06/2015   Procedure: Loop Recorder Insertion;  Surgeon: Thompson Grayer, MD;  Location: [facility];  Service: Cardiovascular;  Laterality: N/A;  . ESOPHAGOGASTRODUODENOSCOPY (EGD) WITH PROPOFOL N/A 11/07/2019   Procedure: ESOPHAGOGASTRODUODENOSCOPY (EGD) WITH PROPOFOL;  Surgeon: Yetta Flock, MD;  Location: [facility];  Service: Gastroenterology;  Laterality: N/A;  . EYE SURGERY Right    Cataract  . HEMOSTASIS CLIP PLACEMENT  11/07/2019   Procedure: HEMOSTASIS CLIP PLACEMENT;  Surgeon: Yetta Flock, MD;  [facility];  Service: Gastroenterology;;  . HEMOSTASIS CONTROL   11/07/2019   Procedure: HEMOSTASIS CONTROL;  Surgeon: Yetta Flock, MD;  [facility];  Service: Gastroenterology;;  . Chrystine Oiler SELECTIVE EACH ADDITIONAL VESSEL  11/28/2016  . IR ANGIOGRAM SELECTIVE EACH ADDITIONAL VESSEL  11/28/2016  . IR ANGIOGRAM SELECTIVE EACH ADDITIONAL VESSEL  11/28/2016  . IR ANGIOGRAM SELECTIVE EACH ADDITIONAL VESSEL  11/28/2016  . IR ANGIOGRAM SELECTIVE EACH ADDITIONAL VESSEL  12/13/2016  . IR ANGIOGRAM SELECTIVE EACH ADDITIONAL VESSEL  12/13/2016  . IR ANGIOGRAM SELECTIVE EACH ADDITIONAL VESSEL  01/09/2017  . IR ANGIOGRAM VISCERAL SELECTIVE  11/28/2016  . IR ANGIOGRAM VISCERAL SELECTIVE  11/28/2016  . IR ANGIOGRAM VISCERAL SELECTIVE  12/13/2016  . IR ANGIOGRAM VISCERAL SELECTIVE  12/13/2016  . IR ANGIOGRAM VISCERAL SELECTIVE  01/09/2017  . IR EMBO ARTERIAL NOT HEMORR HEMANG INC GUIDE ROADMAPPING  11/28/2016  . IR EMBO TUMOR ORGAN ISCHEMIA INFARCT INC GUIDE ROADMAPPING  12/13/2016  . IR EMBO TUMOR ORGAN ISCHEMIA INFARCT INC GUIDE ROADMAPPING  01/09/2017  . IR IVC FILTER PLMT / S&I /IMG GUID/MOD SED  04/24/2017  . IR RADIOLOGIST EVAL & MGMT  11/06/2016  . IR RADIOLOGIST EVAL & MGMT  01/02/2017  . IR RADIOLOGIST EVAL & MGMT  02/05/2017  . IR RADIOLOGIST EVAL & MGMT  05/02/2017  . IR RADIOLOGIST EVAL & MGMT  10/17/2017  . IR US GUIDE VASC ACCESS RIGHT  11/28/2016  . IR US GUIDE VASC ACCESS RIGHT  12/13/2016  . IR US GUIDE VASC ACCESS RIGHT  01/09/2017  . KNEE ARTHROSCOPY Right 11/14/2006  . KNEE ARTHROSCOPY Bilateral 5 and 6 years ago  . KNEE ARTHROSCOPY WITH LATERAL MENISECTOMY  07/03/2012   Procedure: KNEE ARTHROSCOPY WITH LATERAL MENISECTOMY;  Surgeon: Magnus Sinning, MD;  [facility];  Service: Orthopedics;  Laterality: Left;  with Partial Lateral Menisectomy and Medial Menisectomy. Shaving of medial and lateral femoral condyles. Shaving of patella. Removal of a loose body  . LOOP RECORDER REMOVAL N/A 05/22/2018   Procedure: LOOP RECORDER REMOVAL;  Surgeon: Thompson Grayer, MD;  Location: [facility];  Service: Cardiovascular;  Laterality: N/A;  . PACEMAKER IMPLANT N/A 05/22/2018   MDT Azure XT DR MRI implanted by Dr Rayann Heman for sick sinus syndrome  . POLYPECTOMY  11/07/2019  Procedure: POLYPECTOMY;  Surgeon: Yetta Flock, MD;  [facility];  Service: Gastroenterology;;  . tibial and fibular internal fixation Left   . TOTAL ABDOMINAL HYSTERECTOMY  86 years old  . UPPER GASTROINTESTINAL ENDOSCOPY  2009, 2013    REVIEW OF SYSTEMS:  Constitutional: positive for fatigue Eyes: negative Ears, nose, mouth, throat, and face: negative Respiratory: positive for dyspnea on exertion Cardiovascular: negative Gastrointestinal: negative Genitourinary:negative Integument/breast: negative Hematologic/lymphatic: negative Musculoskeletal:positive for arthralgias and muscle weakness Neurological: negative Behavioral/Psych: negative Endocrine: negative Allergic/Immunologic: negative   PHYSICAL EXAMINATION: General appearance: alert, cooperative, fatigued and no distress Head: Normocephalic, without obvious abnormality, atraumatic Neck: no adenopathy, no JVD, supple, symmetrical, trachea midline and thyroid not enlarged, symmetric, no tenderness/mass/nodules Lymph nodes: Cervical, supraclavicular, and axillary nodes normal. Resp: clear to auscultation bilaterally Back: symmetric, no curvature. ROM normal. No CVA tenderness. Cardio: regular rate and rhythm, S1, S2 normal, no murmur, click, rub or gallop GI: soft, non-tender; bowel sounds normal; no masses,  no organomegaly Extremities: extremities normal, atraumatic, no cyanosis or edema Neurologic: Alert and oriented X 3, normal strength and tone. Normal symmetric reflexes. Normal coordination and gait  ECOG PERFORMANCE STATUS: 2 - Symptomatic, <50% confined to bed  Blood pressure (!) 166/60, pulse 60, temperature 97.7 F (36.5 C), temperature source Temporal, resp. rate 20, height 5\' 4"   (1.626 m), weight 200 lb 1.6 oz (90.8 kg), SpO2 98 %.  LABORATORY DATA: Lab Results  Component Value Date   WBC 3.0 (L) 02/05/2020   HGB 10.4 (L) 02/05/2020   HCT 34.3 (L) 02/05/2020   MCV 89.8 02/05/2020   PLT 76 (L) 02/05/2020      Chemistry      Component Value Date/Time   NA 138 02/05/2020 0949   NA 138 11/18/2019 1000   NA 135 (L) 07/10/2017 1142   K 4.3 02/05/2020 0949   K 4.6 07/10/2017 1142   CL 105 02/05/2020 0949   CO2 25 02/05/2020 0949   CO2 25 07/10/2017 1142   BUN 16 02/05/2020 0949   BUN 11 11/18/2019 1000   BUN 11.4 07/10/2017 1142   CREATININE 0.93 02/05/2020 0949   CREATININE 1.0 07/10/2017 1142      Component Value Date/Time   CALCIUM 9.7 02/05/2020 0949   CALCIUM 9.9 07/10/2017 1142   ALKPHOS 113 02/05/2020 0949   ALKPHOS 93 07/10/2017 1142   AST 23 02/05/2020 0949   AST 43 (H) 07/10/2017 1142   ALT 12 02/05/2020 0949   ALT 28 07/10/2017 1142   BILITOT 0.4 02/05/2020 0949   BILITOT 0.56 07/10/2017 1142       RADIOGRAPHIC STUDIES: CT Chest W Contrast  Result Date: 02/05/2020 CLINICAL DATA:  Primary Cancer Type: Lung Imaging Indication: Routine surveillance Interval therapy since last imaging? No Initial Cancer Diagnosis Date: 08/20/2016; Established by: Biopsy-proven Detailed Pathology: Metastatic intermediate grade neuroendocrine carcinoma of questionable lung primary and multiple metastatic liver lesions and pancreatic lesions. Primary Tumor location: Left lung lower lobe, right upper lobe. Left and right hepatic lobes. Pancreatic tail. Surgeries: Appendectomy, hysterectomy. Chemotherapy: Yes; Ongoing? No; Most recent administration: 08/23/2019 Immunotherapy? No Radiation therapy? No Other Cancer Therapies: Y-90 radioembolization to left hepatic lobe 01/09/2017; right hepatic lobe 12/13/2016. EXAM: CT CHEST, ABDOMEN, AND PELVIS WITH CONTRAST TECHNIQUE: Multidetector CT imaging of the chest, abdomen and pelvis was performed following the standard  protocol during bolus administration of intravenous contrast. CONTRAST:  173mL OMNIPAQUE IOHEXOL 300 MG/ML  SOLN COMPARISON:  Most recent CT chest, abdomen and pelvis 11/06/2019. 10/02/2016 PET-CT.  FINDINGS: CT CHEST FINDINGS Cardiovascular: Calcified atheromatous plaque in the thoracic aorta. Stable 4.3 cm dilation of the ascending aorta. Dilation also of the central pulmonary artery, main pulmonary artery at 3.7 cm. Heart size is stable with signs of pacer device in the RIGHT heart. Power pack over LEFT chest. No pericardial effusion. Mediastinum/Nodes: No thoracic inlet or axillary lymphadenopathy. Esophagus grossly normal. No hilar adenopathy. No mediastinal adenopathy. Lungs/Pleura: Scattered small pulmonary nodules show a similar appearance with numerous pulmonary nodules. Bandlike area in the upper lobe is stable measuring approximately 1 cm greatest thickness. Airways are patent. No effusion. No consolidation. Musculoskeletal: Please see below for full musculoskeletal details. CT ABDOMEN PELVIS FINDINGS Hepatobiliary: Multiple hepatic metastatic lesions. Lesion in hepatic subsegment II (image 37, series 2) stable at approximately 1.8 cm. LEFT intrahepatic biliary duct distension unchanged. Lesion in hepatic subsegment III, LEFT hepatic lobe inferiorly (image 44, series 2) 3.8 x 3.6 cm within 1-2 mm of previous measurement at 3.7 x 3.6 cm previously. Lesion in hepatic subsegment VIII, near the periphery of the cephalad RIGHT hepatic lobe (image 37, series 2) 1.1 cm previously 1.1 cm. Area of hypodensity adjacent to this more wedge-shaped also stable. There are rounded areas within this location but these are unchanged measuring approximately 1.5 cm in terms of AP dimension of the largest area of more rounded hypodensity. Small area in hepatic subsegment V (image 46 of series 2) is also unchanged. No signs of extrahepatic biliary ductal distension. Cholelithiasis. Portal vein remains patent. Replaced RIGHT  hepatic artery arising from SMA. Pancreas: Pancreas without ductal dilation or inflammation. Spleen: Spleen normal in size and contour. Adrenals/Urinary Tract: Adrenal glands are normal. Symmetric renal enhancement. No hydronephrosis. Urinary bladder is normal. Stomach/Bowel: Stomach under distended limiting assessment. Metallic clips in the gastric body likely related to prior endoscopy. No acute small bowel process. No mesenteric mass or adenopathy. Colon is normal based on CT appearance. Vascular/Lymphatic: Calcified noncalcified atheromatous plaque of the abdominal aorta. No aneurysmal dilation. IVC filter in situ. No retroperitoneal adenopathy. No pelvic lymphadenopathy. Reproductive: Post hysterectomy. Other: No ascites.  No peritoneal nodularity. Musculoskeletal: Osteopenia. Spinal degenerative changes. Signs of prior fracture of the sternum with similar appearance. IMPRESSION: 1. Multiple hepatic metastatic lesions and pulmonary nodules stable from most recent comparison of 11/06/2019. 2. Scattered small pulmonary nodules show a similar appearance with numerous pulmonary nodules. 3. Stable intrahepatic biliary duct dilation in the LEFT hepatic lobe. 4. Stable 4.3 cm dilation of the ascending aorta. 5. Dilation of the central pulmonary artery, main pulmonary artery at 3.7 cm, raising the question of pulmonary arterial hypertension. 6. Aortic atherosclerosis. 7. Signs of prior fracture of the sternum with similar appearance. Aortic Atherosclerosis (ICD10-I70.0). Electronically Signed   By: Zetta Bills M.D.   On: 02/05/2020 12:37   CT Abdomen Pelvis W Contrast  Result Date: 02/05/2020 CLINICAL DATA:  Primary Cancer Type: Lung Imaging Indication: Routine surveillance Interval therapy since last imaging? No Initial Cancer Diagnosis Date: 08/20/2016; Established by: Biopsy-proven Detailed Pathology: Metastatic intermediate grade neuroendocrine carcinoma of questionable lung primary and multiple metastatic  liver lesions and pancreatic lesions. Primary Tumor location: Left lung lower lobe, right upper lobe. Left and right hepatic lobes. Pancreatic tail. Surgeries: Appendectomy, hysterectomy. Chemotherapy: Yes; Ongoing? No; Most recent administration: 08/23/2019 Immunotherapy? No Radiation therapy? No Other Cancer Therapies: Y-90 radioembolization to left hepatic lobe 01/09/2017; right hepatic lobe 12/13/2016. EXAM: CT CHEST, ABDOMEN, AND PELVIS WITH CONTRAST TECHNIQUE: Multidetector CT imaging of the chest, abdomen and pelvis was performed following the  standard protocol during bolus administration of intravenous contrast. CONTRAST:  161mL OMNIPAQUE IOHEXOL 300 MG/ML  SOLN COMPARISON:  Most recent CT chest, abdomen and pelvis 11/06/2019. 10/02/2016 PET-CT. FINDINGS: CT CHEST FINDINGS Cardiovascular: Calcified atheromatous plaque in the thoracic aorta. Stable 4.3 cm dilation of the ascending aorta. Dilation also of the central pulmonary artery, main pulmonary artery at 3.7 cm. Heart size is stable with signs of pacer device in the RIGHT heart. Power pack over LEFT chest. No pericardial effusion. Mediastinum/Nodes: No thoracic inlet or axillary lymphadenopathy. Esophagus grossly normal. No hilar adenopathy. No mediastinal adenopathy. Lungs/Pleura: Scattered small pulmonary nodules show a similar appearance with numerous pulmonary nodules. Bandlike area in the upper lobe is stable measuring approximately 1 cm greatest thickness. Airways are patent. No effusion. No consolidation. Musculoskeletal: Please see below for full musculoskeletal details. CT ABDOMEN PELVIS FINDINGS Hepatobiliary: Multiple hepatic metastatic lesions. Lesion in hepatic subsegment II (image 37, series 2) stable at approximately 1.8 cm. LEFT intrahepatic biliary duct distension unchanged. Lesion in hepatic subsegment III, LEFT hepatic lobe inferiorly (image 44, series 2) 3.8 x 3.6 cm within 1-2 mm of previous measurement at 3.7 x 3.6 cm previously.  Lesion in hepatic subsegment VIII, near the periphery of the cephalad RIGHT hepatic lobe (image 37, series 2) 1.1 cm previously 1.1 cm. Area of hypodensity adjacent to this more wedge-shaped also stable. There are rounded areas within this location but these are unchanged measuring approximately 1.5 cm in terms of AP dimension of the largest area of more rounded hypodensity. Small area in hepatic subsegment V (image 46 of series 2) is also unchanged. No signs of extrahepatic biliary ductal distension. Cholelithiasis. Portal vein remains patent. Replaced RIGHT hepatic artery arising from SMA. Pancreas: Pancreas without ductal dilation or inflammation. Spleen: Spleen normal in size and contour. Adrenals/Urinary Tract: Adrenal glands are normal. Symmetric renal enhancement. No hydronephrosis. Urinary bladder is normal. Stomach/Bowel: Stomach under distended limiting assessment. Metallic clips in the gastric body likely related to prior endoscopy. No acute small bowel process. No mesenteric mass or adenopathy. Colon is normal based on CT appearance. Vascular/Lymphatic: Calcified noncalcified atheromatous plaque of the abdominal aorta. No aneurysmal dilation. IVC filter in situ. No retroperitoneal adenopathy. No pelvic lymphadenopathy. Reproductive: Post hysterectomy. Other: No ascites.  No peritoneal nodularity. Musculoskeletal: Osteopenia. Spinal degenerative changes. Signs of prior fracture of the sternum with similar appearance. IMPRESSION: 1. Multiple hepatic metastatic lesions and pulmonary nodules stable from most recent comparison of 11/06/2019. 2. Scattered small pulmonary nodules show a similar appearance with numerous pulmonary nodules. 3. Stable intrahepatic biliary duct dilation in the LEFT hepatic lobe. 4. Stable 4.3 cm dilation of the ascending aorta. 5. Dilation of the central pulmonary artery, main pulmonary artery at 3.7 cm, raising the question of pulmonary arterial hypertension. 6. Aortic  atherosclerosis. 7. Signs of prior fracture of the sternum with similar appearance. Aortic Atherosclerosis (ICD10-I70.0). Electronically Signed   By: Zetta Bills M.D.   On: 02/05/2020 12:37    ASSESSMENT AND PLAN:  This is a very pleasant 86  years old white female with metastatic intermediate grade neuroendocrine carcinoma of questionable lung primary and multiple metastatic liver lesions and pancreatic lesions.She status post treatment with radio embolization with Y90 to the left and right lobe liver lesions.Status post treatment with Y 90 to the left and right lobes of the liver. She is currently undergoing systemic chemotherapy with Xeloda and Temodar status post 43 cycles.   The patient has been tolerating the treatment well except for the increasing fatigue  and weakness secondary to anemia likely chemotherapy-induced versus iron deficiency anemia secondary to gastrointestinal blood loss.  She is currently off treatment with Xarelto. The patient has been off treatment for the last few months and she is doing fine except for the baseline fatigue and weakness. She had repeat CT scan of the chest, abdomen pelvis performed recently.  I personally and independently reviewed the scans and discussed the results with the patient and her husband. Her scan showed no concerning findings for disease progression. I recommended for the patient to continue on observation for now with repeat CT scan of the chest, abdomen pelvis in 3 months. For the anxiety, I will give the patient a refill of Xanax to be used on as-needed basis. For the iron deficiency anemia, she will continue on the oral iron tablets. For the hypertension she will continue her routine follow-up visit by her primary care physician and takes her medication as prescribed. Regarding her prognosis, the patient has been doing very well for several years with her condition.  I do not see any indication of an eminent issues but definitely she has  many other comorbidities that could affect her long-term survival with her cancer. The patient was advised to call immediately if she has any concerning symptoms in the interval.  The patient voices understanding of current disease status and treatment options and is in agreement with the current care plan. All questions were answered. The patient knows to call the clinic with any problems, questions or concerns. We can certainly see the patient much sooner if necessary.  Disclaimer: This note was dictated with voice recognition software. Similar sounding words can inadvertently be transcribed and may not be corrected upon review.

## 2020-02-10 ENCOUNTER — Telehealth: Payer: Self-pay | Admitting: Internal Medicine

## 2020-02-10 NOTE — Telephone Encounter (Signed)
Scheduled apt per 7/13 los - mailed reminder letter with appt date and time   

## 2020-02-11 ENCOUNTER — Ambulatory Visit: Payer: Medicare Other | Admitting: Family Medicine

## 2020-02-11 ENCOUNTER — Other Ambulatory Visit: Payer: Self-pay | Admitting: Internal Medicine

## 2020-02-11 ENCOUNTER — Telehealth: Payer: Self-pay | Admitting: *Deleted

## 2020-02-11 NOTE — Telephone Encounter (Signed)
Received call from pt's husband, Audry Pili.  He is inquiring about the refill for his wife's Xanax. He states it is not at the pharmacy.  He also states that the new prescription will be for 0.5 mg. They use [facility] in [place]

## 2020-02-11 NOTE — Telephone Encounter (Signed)
No. The refill will stay 0.25 mg. Only in extreme condition she could take 2 tab. Thank you.

## 2020-02-11 NOTE — Telephone Encounter (Signed)
Please approve or disapprove.  Thanks! Threasa Beards

## 2020-02-15 ENCOUNTER — Ambulatory Visit: Payer: Medicare Other | Admitting: Family Medicine

## 2020-02-23 ENCOUNTER — Other Ambulatory Visit: Payer: Self-pay | Admitting: Family Medicine

## 2020-02-29 ENCOUNTER — Ambulatory Visit (INDEPENDENT_AMBULATORY_CARE_PROVIDER_SITE_OTHER): Payer: Medicare Other | Admitting: *Deleted

## 2020-02-29 DIAGNOSIS — I495 Sick sinus syndrome: Secondary | ICD-10-CM

## 2020-02-29 LAB — CUP PACEART REMOTE DEVICE CHECK
Battery Remaining Longevity: 149 mo
Battery Voltage: 3.03 V
Brady Statistic AP VP Percent: 0.09 %
Brady Statistic AP VS Percent: 91.8 %
Brady Statistic AS VP Percent: 0 %
Brady Statistic AS VS Percent: 8.11 %
Brady Statistic RA Percent Paced: 91.58 %
Brady Statistic RV Percent Paced: 0.1 %
Date Time Interrogation Session: 20210801210731
Implantable Lead Implant Date: 20191024
Implantable Lead Implant Date: 20191024
Implantable Lead Location: 753859
Implantable Lead Location: 753860
Implantable Lead Model: 5076
Implantable Lead Model: 5076
Implantable Pulse Generator Implant Date: 20191024
Lead Channel Impedance Value: 304 Ohm
Lead Channel Impedance Value: 323 Ohm
Lead Channel Impedance Value: 380 Ohm
Lead Channel Impedance Value: 551 Ohm
Lead Channel Pacing Threshold Amplitude: 0.625 V
Lead Channel Pacing Threshold Amplitude: 1.125 V
Lead Channel Pacing Threshold Pulse Width: 0.4 ms
Lead Channel Pacing Threshold Pulse Width: 0.4 ms
Lead Channel Sensing Intrinsic Amplitude: 0.875 mV
Lead Channel Sensing Intrinsic Amplitude: 0.875 mV
Lead Channel Sensing Intrinsic Amplitude: 13.875 mV
Lead Channel Sensing Intrinsic Amplitude: 13.875 mV
Lead Channel Setting Pacing Amplitude: 1.5 V
Lead Channel Setting Pacing Amplitude: 2.25 V
Lead Channel Setting Pacing Pulse Width: 0.4 ms
Lead Channel Setting Sensing Sensitivity: 1.2 mV

## 2020-03-02 NOTE — Progress Notes (Signed)
Remote pacemaker transmission.   

## 2020-03-17 ENCOUNTER — Other Ambulatory Visit: Payer: Self-pay | Admitting: Family Medicine

## 2020-03-17 DIAGNOSIS — F329 Major depressive disorder, single episode, unspecified: Secondary | ICD-10-CM

## 2020-03-17 DIAGNOSIS — F411 Generalized anxiety disorder: Secondary | ICD-10-CM

## 2020-03-17 DIAGNOSIS — F32A Depression, unspecified: Secondary | ICD-10-CM

## 2020-04-02 ENCOUNTER — Other Ambulatory Visit: Payer: Self-pay | Admitting: Family Medicine

## 2020-04-02 DIAGNOSIS — I2699 Other pulmonary embolism without acute cor pulmonale: Secondary | ICD-10-CM

## 2020-05-03 ENCOUNTER — Ambulatory Visit (INDEPENDENT_AMBULATORY_CARE_PROVIDER_SITE_OTHER): Payer: Medicare Other | Admitting: Cardiovascular Disease

## 2020-05-03 ENCOUNTER — Other Ambulatory Visit: Payer: Self-pay

## 2020-05-03 ENCOUNTER — Encounter: Payer: Self-pay | Admitting: Cardiovascular Disease

## 2020-05-03 DIAGNOSIS — R001 Bradycardia, unspecified: Secondary | ICD-10-CM

## 2020-05-03 DIAGNOSIS — I10 Essential (primary) hypertension: Secondary | ICD-10-CM

## 2020-05-03 DIAGNOSIS — E782 Mixed hyperlipidemia: Secondary | ICD-10-CM

## 2020-05-03 DIAGNOSIS — I480 Paroxysmal atrial fibrillation: Secondary | ICD-10-CM

## 2020-05-03 NOTE — Assessment & Plan Note (Signed)
History of hyperlipidemia on statin therapy with lipid profile performed 10/10/2018 revealing total cholesterol 133, LDL 59 and HDL of 48.

## 2020-05-03 NOTE — Assessment & Plan Note (Signed)
History of essential hypertension a blood pressure measured today 178/60.  She is on amlodipine.

## 2020-05-03 NOTE — Progress Notes (Signed)
05/03/2020 [name]   [birth year]  [id number]  Primary Physician [name], MD Primary Cardiologist: Lorretta Harp MD Garret Reddish, [place], [place]  HPI:  [name] is a 86 y.o.  moderately overweight married Caucasian female mother of 12 (son 24 years old died 79/2 years ago prostate CA), grandmother of 2 grandchildren, who was formally a patient of Dr. Delfino Lovett Lowella Fairy.  She is accompanied by her husband [name] today.  I last saw her in the office 04/28/2019.Marland Kitchen She has no prior cardiac history.She does have a history of treated hypertension and hyperlipidemia as well as a family history of heart disease. Her father died at age 26 from a heart attack as did her brother. She has another brother who is in open-heart surgery. She has never had a heart attack but has had "mini strokes" in the past. She does get dyspnea on exertion but denies chest pain. I left cell her one year ago. She apparently had Ace sternal fracture secondary to a fall 6 months ago and had a chest CT that showed abnormalities in her lung parenchyma which has been followed by serial CT scans. She was recently admitted to, hospital for chest pain rule out MI. CT angiogram was negative for PE. A Myoview stress test was negative as well. She complains of episodes of weakness as well as palpitations. She does have blood pressure and heart rate readings with some relative bradycardia. Because of palpitations and weakness and performed a 30 day event monitor which showed predominantly sinus rhythm with episodes of sinus bradycardia, pauses up to 2 seconds and a short run of paroxysmal fibrillation with a rapid ventricular response. She is not a good oral anticoagulation patient because of frequent falls and AVMs.  She had a loop recorder implanted by Dr. Rayann Heman which has shown some brief episodes of PAF but no arrhythmias that would be contributing to syncope. She is on Xarelto oral anticoagulation.  Apparently her loop  recorder has shown bradycardia which may be contributory to her symptoms. Dr. Rayann Heman is planning on removing her loop recorder tomorrow and placing a permanent transvenous pacemaker. Because of her ongoing chest pain without prior cardiac catheterization it was decided to proceed with outpatient radial diagnostic cath in the morning to define her coronary anatomy prior to pacemaker insertion.  I performed outpatient radial diagnostic cath 05/22/2018 revealing minimal CAD, normal LV function. She then underwent permanent transvenous pacemaker insertion by Dr. Rayann Heman the following day because of symptomatic bradycardia with Medtronic XT DR MRI SureScan model.  Since I saw her a year ago she is remained stable.  She is wheelchair bound because of cerebellar degeneration.  She does not denies chest pain or shortness of breath.  Apparently her stage IV lung cancer has progressed.  Her symptoms of dizziness have improved since pacemaker implantation.   Current Meds  Medication Sig  . ALPRAZolam (XANAX) 0.25 MG tablet TAKE 1 TABLET BY MOUTH AT BEDTIME AS NEEDED FOR ANXIETY  . amLODipine (NORVASC) 5 MG tablet Take 1 tablet (5 mg total) by mouth daily.  Marland Kitchen escitalopram (LEXAPRO) 20 MG tablet TAKE 1 TABLET AT BEDTIME  . fluticasone (FLONASE) 50 MCG/ACT nasal spray Place 2 sprays into both nostrils at bedtime.  . furosemide (LASIX) 20 MG tablet TAKE 1 TABLET DAILY AS NEEDED FOR FLUID RETENTION (Patient taking differently: Take 20 mg by mouth daily as needed for fluid. )  . gabapentin (NEURONTIN) 400 MG capsule TAKE 1 TO 2 CAPSULES THREE  TIMES A DAY  . hydroxypropyl methylcellulose / hypromellose (ISOPTO TEARS / GONIOVISC) 2.5 % ophthalmic solution Place 1 drop into both eyes daily.   Marland Kitchen lovastatin (MEVACOR) 40 MG tablet TAKE 1 TABLET AT BEDTIME (Patient taking differently: Take 40 mg by mouth at bedtime. )  . Melatonin 3 MG TBDP Take 3-6 mg by mouth at bedtime as needed. (Patient taking differently: Take  3-6 mg by mouth at bedtime as needed (sleep). )  . ondansetron (ZOFRAN) 8 MG tablet Take 1 tablet (8 mg total) by mouth every 8 (eight) hours as needed for nausea or vomiting.  . OXcarbazepine (TRILEPTAL) 150 MG tablet Take 1 tablet (150 mg total) by mouth 2 (two) times daily.  . QUEtiapine (SEROQUEL) 200 MG tablet Take 1 tablet (200 mg total) by mouth at bedtime.  . silver sulfADIAZINE (SILVADENE) 1 % cream Apply 1 application topically daily.  Marland Kitchen SYNTHROID 75 MCG tablet TAKE 1 TABLET DAILY BEFORE BREAKFAST  . XARELTO 20 MG TABS tablet TAKE 1 TABLET DAILY WITH SUPPER     Allergies  Allergen Reactions  . Levaquin [Levofloxacin] Shortness Of Breath  . Lisinopril Cough    Social History   Socioeconomic History  . Marital status: Married    Spouse name: [name]  . Number of children: 6  . Years of education: 42  . Highest education level: 10th grade  Occupational History  . Occupation: retired  Tobacco Use  . Smoking status: Former Smoker    Packs/day: 0.25    Years: 10.00    Pack years: 2.50    Types: Cigarettes    Quit date: 07/31/1975    Years since quitting: 44.7  . Smokeless tobacco: Never Used  Vaping Use  . Vaping Use: Never used  Substance and Sexual Activity  . Alcohol use: No  . Drug use: No  . Sexual activity: Not Currently  Other Topics Concern  . Not on file  Social History Narrative   Lives with husband, does have stairs, does not use them. Pt completed 10th grade.   Right handed   Social Determinants of Health   Financial Resource Strain: Low Risk   . Difficulty of Paying Living Expenses: Not hard at all  Food Insecurity: No Food Insecurity  . Worried About Charity fundraiser in the Last Year: Never true  . Ran Out of Food in the Last Year: Never true  Transportation Needs: No Transportation Needs  . Lack of Transportation (Medical): No  . Lack of Transportation (Non-Medical): No  Physical Activity: Inactive  . Days of Exercise per Week: 0 days    . Minutes of Exercise per Session: 0 min  Stress: No Stress Concern Present  . Feeling of Stress : Only a little  Social Connections: Moderately Isolated  . Frequency of Communication with Friends and Family: More than three times a week  . Frequency of Social Gatherings with Friends and Family: More than three times a week  . Attends Religious Services: Never  . Active Member of Clubs or Organizations: No  . Attends Archivist Meetings: Never  . Marital Status: Married  Human resources officer Violence: Not At Risk  . Fear of Current or Ex-Partner: No  . Emotionally Abused: No  . Physically Abused: No  . Sexually Abused: No     Review of Systems: General: negative for chills, fever, night sweats or weight changes.  Cardiovascular: negative for chest pain, dyspnea on exertion, edema, orthopnea, palpitations, paroxysmal nocturnal dyspnea or shortness of  breath Dermatological: negative for rash Respiratory: negative for cough or wheezing Urologic: negative for hematuria Abdominal: negative for nausea, vomiting, diarrhea, bright red blood per rectum, melena, or hematemesis Neurologic: negative for visual changes, syncope, or dizziness All other systems reviewed and are otherwise negative except as noted above.    Blood pressure (!) 178/60, pulse 60, height 5\' 4"  (1.626 m), weight 205 lb (93 kg).  General appearance: alert and no distress Neck: no adenopathy, no carotid bruit, no JVD, supple, symmetrical, trachea midline and thyroid not enlarged, symmetric, no tenderness/mass/nodules Lungs: clear to auscultation bilaterally Heart: Soft outflow tract murmur Extremities: extremities normal, atraumatic, no cyanosis or edema Pulses: 2+ and symmetric Skin: Skin color, texture, turgor normal. No rashes or lesions Neurologic: Alert and oriented X 3, normal strength and tone. Normal symmetric reflexes. Normal coordination and gait  EKG not performed today  ASSESSMENT AND PLAN:    Hyperlipemia History of hyperlipidemia on statin therapy with lipid profile performed 10/10/2018 revealing total cholesterol 133, LDL 59 and HDL of 48.  Essential hypertension History of essential hypertension a blood pressure measured today 178/60.  She is on amlodipine.  Paroxysmal atrial fibrillation ([name]) History of paroxysmal A. fib on Xarelto oral anticoagulation.  Sinus bradycardia History of symptomatic bradycardia status post permanent transvenous pacemaker insertion by Dr. Rayann Heman 05/23/2018 which he follows.  Her symptoms of dizziness improved after that.      Lorretta Harp MD FACP,FACC,FAHA, [facility] 05/03/2020 5:06 PM

## 2020-05-03 NOTE — Assessment & Plan Note (Signed)
History of symptomatic bradycardia status post permanent transvenous pacemaker insertion by Dr. Rayann Heman 05/23/2018 which he follows.  [name] symptoms of dizziness improved after that.

## 2020-05-03 NOTE — Assessment & Plan Note (Signed)
History of paroxysmal A. fib on Xarelto oral anticoagulation.

## 2020-05-03 NOTE — Patient Instructions (Signed)
  Follow-Up: At [facility], you and your health needs are our priority.  As part of our continuing mission to provide you with exceptional heart care, we have created designated Provider Care Teams.  These Care Teams include your primary Cardiologist (physician) and Advanced Practice Providers (APPs -  Physician Assistants and Nurse Practitioners) who all work together to provide you with the care you need, when you need it.  We recommend signing up for the patient portal called "MyChart".  Sign up information is provided on this After Visit Summary.  MyChart is used to connect with patients for Virtual Visits (Telemedicine).  Patients are able to view lab/test results, encounter notes, upcoming appointments, etc.  Non-urgent messages can be sent to your provider as well.   To learn more about what you can do with MyChart, go to [email or web address].    Your next appointment:   12 month(s)  The format for your next appointment:   In Person  Provider:   You may see Quay Burow, MD or one of the following Advanced Practice Providers on your designated Care Team:    Kerin Ransom, PA-C  [place], [place]  Coletta Memos, [place]

## 2020-05-09 ENCOUNTER — Other Ambulatory Visit: Payer: Self-pay

## 2020-05-09 ENCOUNTER — Ambulatory Visit (HOSPITAL_COMMUNITY)
Admission: RE | Admit: 2020-05-09 | Discharge: 2020-05-09 | Disposition: A | Discharge status: Home / Self Care | Payer: Medicare Other | Source: Ambulatory Visit | Attending: Internal Medicine | Admitting: Internal Medicine

## 2020-05-09 ENCOUNTER — Inpatient Hospital Stay: Payer: Medicare Other | Attending: Internal Medicine

## 2020-05-09 DIAGNOSIS — C7A090 Malignant carcinoid tumor of the bronchus and lung: Secondary | ICD-10-CM | POA: Insufficient documentation

## 2020-05-09 DIAGNOSIS — I480 Paroxysmal atrial fibrillation: Secondary | ICD-10-CM | POA: Diagnosis not present

## 2020-05-09 DIAGNOSIS — C7B02 Secondary carcinoid tumors of liver: Secondary | ICD-10-CM | POA: Insufficient documentation

## 2020-05-09 DIAGNOSIS — Z86718 Personal history of other venous thrombosis and embolism: Secondary | ICD-10-CM | POA: Diagnosis not present

## 2020-05-09 DIAGNOSIS — Z86711 Personal history of pulmonary embolism: Secondary | ICD-10-CM | POA: Diagnosis not present

## 2020-05-09 DIAGNOSIS — E785 Hyperlipidemia, unspecified: Secondary | ICD-10-CM | POA: Insufficient documentation

## 2020-05-09 DIAGNOSIS — I700 Atherosclerosis of aorta: Secondary | ICD-10-CM | POA: Diagnosis not present

## 2020-05-09 DIAGNOSIS — Z87440 Personal history of urinary (tract) infections: Secondary | ICD-10-CM | POA: Insufficient documentation

## 2020-05-09 DIAGNOSIS — C3490 Malignant neoplasm of unspecified part of unspecified bronchus or lung: Secondary | ICD-10-CM | POA: Diagnosis not present

## 2020-05-09 DIAGNOSIS — Z9221 Personal history of antineoplastic chemotherapy: Secondary | ICD-10-CM | POA: Diagnosis not present

## 2020-05-09 DIAGNOSIS — G40909 Epilepsy, unspecified, not intractable, without status epilepticus: Secondary | ICD-10-CM | POA: Diagnosis not present

## 2020-05-09 DIAGNOSIS — Z8673 Personal history of transient ischemic attack (TIA), and cerebral infarction without residual deficits: Secondary | ICD-10-CM | POA: Insufficient documentation

## 2020-05-09 DIAGNOSIS — M47816 Spondylosis without myelopathy or radiculopathy, lumbar region: Secondary | ICD-10-CM | POA: Diagnosis not present

## 2020-05-09 DIAGNOSIS — K219 Gastro-esophageal reflux disease without esophagitis: Secondary | ICD-10-CM | POA: Diagnosis not present

## 2020-05-09 DIAGNOSIS — Z79899 Other long term (current) drug therapy: Secondary | ICD-10-CM | POA: Diagnosis not present

## 2020-05-09 DIAGNOSIS — E669 Obesity, unspecified: Secondary | ICD-10-CM | POA: Insufficient documentation

## 2020-05-09 DIAGNOSIS — K8020 Calculus of gallbladder without cholecystitis without obstruction: Secondary | ICD-10-CM | POA: Diagnosis not present

## 2020-05-09 DIAGNOSIS — I10 Essential (primary) hypertension: Secondary | ICD-10-CM | POA: Diagnosis not present

## 2020-05-09 DIAGNOSIS — E7800 Pure hypercholesterolemia, unspecified: Secondary | ICD-10-CM | POA: Diagnosis not present

## 2020-05-09 DIAGNOSIS — Z7901 Long term (current) use of anticoagulants: Secondary | ICD-10-CM | POA: Diagnosis not present

## 2020-05-09 DIAGNOSIS — S2220XA Unspecified fracture of sternum, initial encounter for closed fracture: Secondary | ICD-10-CM | POA: Diagnosis not present

## 2020-05-09 DIAGNOSIS — E039 Hypothyroidism, unspecified: Secondary | ICD-10-CM | POA: Diagnosis not present

## 2020-05-09 DIAGNOSIS — F329 Major depressive disorder, single episode, unspecified: Secondary | ICD-10-CM | POA: Insufficient documentation

## 2020-05-09 DIAGNOSIS — I2510 Atherosclerotic heart disease of native coronary artery without angina pectoris: Secondary | ICD-10-CM | POA: Diagnosis not present

## 2020-05-09 DIAGNOSIS — F419 Anxiety disorder, unspecified: Secondary | ICD-10-CM | POA: Insufficient documentation

## 2020-05-09 DIAGNOSIS — I77810 Thoracic aortic ectasia: Secondary | ICD-10-CM | POA: Diagnosis not present

## 2020-05-09 LAB — CBC WITH DIFFERENTIAL (CANCER CENTER ONLY)
Abs Immature Granulocytes: 0.02 10*3/uL (ref 0.00–0.07)
Basophils Absolute: 0 10*3/uL (ref 0.0–0.1)
Basophils Relative: 1 %
Eosinophils Absolute: 0.1 10*3/uL (ref 0.0–0.5)
Eosinophils Relative: 4 %
HCT: 29.2 % — ABNORMAL LOW (ref 36.0–46.0)
Hemoglobin: 8.9 g/dL — ABNORMAL LOW (ref 12.0–15.0)
Immature Granulocytes: 1 %
Lymphocytes Relative: 20 %
Lymphs Abs: 0.5 10*3/uL — ABNORMAL LOW (ref 0.7–4.0)
MCH: 23.2 pg — ABNORMAL LOW (ref 26.0–34.0)
MCHC: 30.5 g/dL (ref 30.0–36.0)
MCV: 76.2 fL — ABNORMAL LOW (ref 80.0–100.0)
Monocytes Absolute: 0.4 10*3/uL (ref 0.1–1.0)
Monocytes Relative: 16 %
Neutro Abs: 1.5 10*3/uL — ABNORMAL LOW (ref 1.7–7.7)
Neutrophils Relative %: 58 %
Platelet Count: 99 10*3/uL — ABNORMAL LOW (ref 150–400)
RBC: 3.83 MIL/uL — ABNORMAL LOW (ref 3.87–5.11)
RDW: 16.8 % — ABNORMAL HIGH (ref 11.5–15.5)
WBC Count: 2.7 10*3/uL — ABNORMAL LOW (ref 4.0–10.5)
nRBC: 0 % (ref 0.0–0.2)

## 2020-05-09 LAB — CMP (CANCER CENTER ONLY)
ALT: 10 U/L (ref 0–44)
AST: 16 U/L (ref 15–41)
Albumin: 3.2 g/dL — ABNORMAL LOW (ref 3.5–5.0)
Alkaline Phosphatase: 103 U/L (ref 38–126)
Anion gap: 2 — ABNORMAL LOW (ref 5–15)
BUN: 14 mg/dL (ref 8–23)
CO2: 27 mmol/L (ref 22–32)
Calcium: 9.4 mg/dL (ref 8.9–10.3)
Chloride: 96 mmol/L — ABNORMAL LOW (ref 98–111)
Creatinine: 0.9 mg/dL (ref 0.44–1.00)
GFR, Estimated: 59 mL/min — ABNORMAL LOW (ref >60)
Glucose, Bld: 94 mg/dL (ref 70–99)
Potassium: 4.6 mmol/L (ref 3.5–5.1)
Sodium: 125 mmol/L — ABNORMAL LOW (ref 135–145)
Total Bilirubin: 0.3 mg/dL (ref 0.3–1.2)
Total Protein: 6.8 g/dL (ref 6.5–8.1)

## 2020-05-09 MED ORDER — IOHEXOL 300 MG/ML  SOLN
100.0000 mL | Freq: Once | INTRAMUSCULAR | Status: AC | PRN
  Administered 2020-05-09: 100 mL via INTRAVENOUS

## 2020-05-11 ENCOUNTER — Other Ambulatory Visit: Payer: Self-pay | Admitting: Internal Medicine

## 2020-05-11 ENCOUNTER — Inpatient Hospital Stay: Payer: Medicare Other

## 2020-05-11 ENCOUNTER — Inpatient Hospital Stay (HOSPITAL_BASED_OUTPATIENT_CLINIC_OR_DEPARTMENT_OTHER): Payer: Medicare Other | Admitting: Internal Medicine

## 2020-05-11 ENCOUNTER — Other Ambulatory Visit: Payer: Self-pay

## 2020-05-11 ENCOUNTER — Encounter: Payer: Self-pay | Admitting: Internal Medicine

## 2020-05-11 VITALS — BP 152/70 | HR 59 | Temp 97.5°F | Resp 18 | Ht 64.0 in

## 2020-05-11 DIAGNOSIS — E039 Hypothyroidism, unspecified: Secondary | ICD-10-CM | POA: Diagnosis not present

## 2020-05-11 DIAGNOSIS — Z5111 Encounter for antineoplastic chemotherapy: Secondary | ICD-10-CM

## 2020-05-11 DIAGNOSIS — D539 Nutritional anemia, unspecified: Secondary | ICD-10-CM | POA: Diagnosis not present

## 2020-05-11 DIAGNOSIS — C7B02 Secondary carcinoid tumors of liver: Secondary | ICD-10-CM | POA: Diagnosis not present

## 2020-05-11 DIAGNOSIS — C7A090 Malignant carcinoid tumor of the bronchus and lung: Secondary | ICD-10-CM | POA: Diagnosis not present

## 2020-05-11 DIAGNOSIS — C7A8 Other malignant neuroendocrine tumors: Secondary | ICD-10-CM

## 2020-05-11 DIAGNOSIS — E669 Obesity, unspecified: Secondary | ICD-10-CM | POA: Diagnosis not present

## 2020-05-11 DIAGNOSIS — C3491 Malignant neoplasm of unspecified part of right bronchus or lung: Secondary | ICD-10-CM | POA: Diagnosis not present

## 2020-05-11 DIAGNOSIS — C3490 Malignant neoplasm of unspecified part of unspecified bronchus or lung: Secondary | ICD-10-CM | POA: Diagnosis not present

## 2020-05-11 DIAGNOSIS — E785 Hyperlipidemia, unspecified: Secondary | ICD-10-CM | POA: Diagnosis not present

## 2020-05-11 DIAGNOSIS — C7B8 Other secondary neuroendocrine tumors: Secondary | ICD-10-CM | POA: Diagnosis not present

## 2020-05-11 DIAGNOSIS — E7800 Pure hypercholesterolemia, unspecified: Secondary | ICD-10-CM | POA: Diagnosis not present

## 2020-05-11 LAB — IRON AND TIBC
Iron: 18 ug/dL — ABNORMAL LOW (ref 41–142)
Saturation Ratios: 4 % — ABNORMAL LOW (ref 21–57)
TIBC: 419 ug/dL (ref 236–444)
UIBC: 401 ug/dL — ABNORMAL HIGH (ref 120–384)

## 2020-05-11 LAB — FERRITIN: Ferritin: 16 ng/mL (ref 11–307)

## 2020-05-11 LAB — VITAMIN B12: Vitamin B-12: 962 pg/mL — ABNORMAL HIGH (ref 180–914)

## 2020-05-11 LAB — FOLATE: Folate: 8.7 ng/mL (ref >5.9)

## 2020-05-11 MED ORDER — ALPRAZOLAM 0.25 MG PO TABS: ORAL_TABLET | ORAL | 0 refills | Status: DC

## 2020-05-11 MED ORDER — INTEGRA PLUS PO CAPS: 1.0000 | ORAL_CAPSULE | Freq: Every morning | ORAL | 2 refills | Status: DC

## 2020-05-11 NOTE — Progress Notes (Signed)
[place] [phone]   Fax:([phone]) [place], [place] [id number]  DIAGNOSIS:  1) Metastatic intermediate. Neuroendocrine tumor of lung primary diagnosed in January 2018 and presented with small bilateral pulmonary nodules in addition to multiple liver metastasis. 2) right lower lobe pulmonary embolism diagnosed incidentally on CT scan of the chest on 04/23/2017  PRIOR THERAPY:  1) Status post radio embolization with Y 90 to the liver lesions by interventional radiology. 2) status post IVC filter placement by interventional radiology on 04/24/2017  CURRENT THERAPY: Xeloda 750 MG/M2 twice a day days 1-14 and Temodar 150 MG/M2 days 10-14 every 4 weeks. Status post 43 cycles.  Her treatment will be on hold for the next few months because of intolerance.  INTERVAL HISTORY: [name] 86 y.o. female returns to the clinic today for follow-up visit accompanied by her husband.  The patient is feeling fine today with no concerning complaints except for the baseline fatigue and shortness of breath with exertion.  She denied having any current chest pain, cough or hemoptysis.  She has no nausea, vomiting, diarrhea or constipation.  She has no headache or visual changes.  She is currently on observation.  The patient had repeat CT scan of the chest, abdomen pelvis performed recently and she is here for evaluation and discussion of her scan results.   MEDICAL HISTORY: Past Medical History:  Diagnosis Date   Acute respiratory failure with hypoxia ([facility]) 10/23/2015   Allergic rhinitis    PT. DENIES   Anxiety    Aortic insufficiency    Echo 04/29/2018: EF 65-70, mild AS (mean 13), mod AI, Asc Aorta 42 mm (mildly dilated), mild LAE, PASP 41, pericardium normal in appearance.    Arthritis    NECK   Ataxia    Back pain 12/13/2016   Bradycardia    primarily nocturnal   Burning tongue syndrome 25 years    Cataract    Cerebellar degeneration ([name])    Chest pain 12/13/2016   Atypical chest pain   Chronic urinary tract infection    Complication of anesthesia    low o2 sats, coded 30 years ago   CVA (cerebral infarction) 05/2003   Dehydration with hyponatremia 03/23/2018   Depression    DVT (deep venous thrombosis) ([facility])    Encounter for antineoplastic chemotherapy 10/09/2016   Fatigue 01/03/2015   Gait disorder    Gastric polyps    GERD (gastroesophageal reflux disease)    Goals of care, counseling/discussion 10/09/2016   H/O: CVA (cerebrovascular accident) 07/14/2016   High cholesterol    History of pericarditis    Hyperlipidemia    Hypotension    Hypothyroidism    IBS (irritable bowel syndrome)    Liver cancer ([facility]) dx'd 06/2016   liver   Lung cancer ([facility])    Neuroendocrine cancer ([facility]) 08/23/2016   Obesity    Paroxysmal atrial fibrillation ([name])    chads2vasc score is 6,  she is felt to be a poor candidate for anticoagulation   Pericarditis    Personal history of arterial venous malformation (AVM)    right side of face   Pulmonary embolism ([facility])    Seizure disorder ([facility])    Seizures ([facility]) 2003   " smelling"- Gabapentin "no problem"   Shortness of breath dyspnea    with exertion   Sick sinus syndrome ([facility])    Sternum fx 10/27/2013   Stroke ([facility])  5 years ago   Right side of face weak, slurred speach-    Thyroid disease    TIA (transient ischemic attack)    UTI (lower urinary tract infection) 03/27/2016   "frequently"    ALLERGIES:  is allergic to levaquin [levofloxacin] and lisinopril.  MEDICATIONS:  Current Outpatient Medications  Medication Sig Dispense Refill   ALPRAZolam (XANAX) 0.25 MG tablet TAKE 1 TABLET BY MOUTH AT BEDTIME AS NEEDED FOR ANXIETY 20 tablet 0   amLODipine (NORVASC) 5 MG tablet Take 1 tablet (5 mg total) by mouth daily. 90 tablet 1   escitalopram (LEXAPRO) 20 MG tablet TAKE 1 TABLET AT BEDTIME 90 tablet 0    fluticasone (FLONASE) 50 MCG/ACT nasal spray Place 2 sprays into both nostrils at bedtime. 48 g 3   furosemide (LASIX) 20 MG tablet TAKE 1 TABLET DAILY AS NEEDED FOR FLUID RETENTION (Patient taking differently: Take 20 mg by mouth daily as needed for fluid. ) 90 tablet 3   gabapentin (NEURONTIN) 400 MG capsule TAKE 1 TO 2 CAPSULES THREE TIMES A DAY 720 capsule 0   hydroxypropyl methylcellulose / hypromellose (ISOPTO TEARS / GONIOVISC) 2.5 % ophthalmic solution Place 1 drop into both eyes daily.      lovastatin (MEVACOR) 40 MG tablet TAKE 1 TABLET AT BEDTIME (Patient taking differently: Take 40 mg by mouth at bedtime. ) 90 tablet 3   Melatonin 3 MG TBDP Take 3-6 mg by mouth at bedtime as needed. (Patient taking differently: Take 3-6 mg by mouth at bedtime as needed (sleep). ) 60 tablet 1   ondansetron (ZOFRAN) 8 MG tablet Take 1 tablet (8 mg total) by mouth every 8 (eight) hours as needed for nausea or vomiting. 90 tablet 1   OXcarbazepine (TRILEPTAL) 150 MG tablet Take 1 tablet (150 mg total) by mouth 2 (two) times daily. 180 tablet 3   pantoprazole (PROTONIX) 40 MG tablet Take 1 tablet (40 mg total) by mouth 2 (two) times daily. 180 tablet 2   QUEtiapine (SEROQUEL) 200 MG tablet Take 1 tablet (200 mg total) by mouth at bedtime. 90 tablet 1   silver sulfADIAZINE (SILVADENE) 1 % cream Apply 1 application topically daily. 400 g 0   SYNTHROID 75 MCG tablet TAKE 1 TABLET DAILY BEFORE BREAKFAST 90 tablet 3   XARELTO 20 MG TABS tablet TAKE 1 TABLET DAILY WITH SUPPER 90 tablet 0   No current facility-administered medications for this visit.   Facility-Administered Medications Ordered in Other Visits  Medication Dose Route Frequency Provider Last Rate Last Admin   heparin lock flush 100 unit/mL  500 Units Intracatheter Daily PRN Heilingoetter, Cassandra L, PA-C        SURGICAL HISTORY:  Past Surgical History:  Procedure Laterality Date   APPENDECTOMY  86 years old   COLONOSCOPY   2006, 2009   COLONOSCOPY WITH PROPOFOL N/A 03/28/2016   Procedure: COLONOSCOPY WITH PROPOFOL;  Surgeon: Gatha Mayer, MD;  Location: [facility];  Service: Endoscopy;  Laterality: N/A;   CORONARY ANGIOGRAPHY N/A 05/22/2018   Procedure: CORONARY ANGIOGRAPHY (CATH LAB);  Surgeon: Lorretta Harp, MD;  Location: [facility];  Service: Cardiovascular;  Laterality: N/A;   cyst removed  35 years ago   EP IMPLANTABLE DEVICE N/A 01/06/2015   Procedure: Loop Recorder Insertion;  Surgeon: Thompson Grayer, MD;  Location: [facility];  Service: Cardiovascular;  Laterality: N/A;   ESOPHAGOGASTRODUODENOSCOPY (EGD) WITH PROPOFOL N/A 11/07/2019   Procedure: ESOPHAGOGASTRODUODENOSCOPY (EGD) WITH PROPOFOL;  Surgeon: Yetta Flock,  MD;  Location: [facility];  Service: Gastroenterology;  Laterality: N/A;   EYE SURGERY Right    Cataract   HEMOSTASIS CLIP PLACEMENT  11/07/2019   Procedure: HEMOSTASIS CLIP PLACEMENT;  Surgeon: Yetta Flock, MD;  Location: [facility];  Service: Gastroenterology;;   HEMOSTASIS CONTROL  11/07/2019   Procedure: HEMOSTASIS CONTROL;  Surgeon: Yetta Flock, MD;  Location: [facility];  Service: Gastroenterology;;   IR ANGIOGRAM SELECTIVE EACH ADDITIONAL VESSEL  11/28/2016   IR ANGIOGRAM SELECTIVE EACH ADDITIONAL VESSEL  11/28/2016   IR ANGIOGRAM SELECTIVE EACH ADDITIONAL VESSEL  11/28/2016   IR ANGIOGRAM SELECTIVE EACH ADDITIONAL VESSEL  11/28/2016   IR ANGIOGRAM SELECTIVE EACH ADDITIONAL VESSEL  12/13/2016   IR ANGIOGRAM SELECTIVE EACH ADDITIONAL VESSEL  12/13/2016   IR ANGIOGRAM SELECTIVE EACH ADDITIONAL VESSEL  01/09/2017   IR ANGIOGRAM VISCERAL SELECTIVE  11/28/2016   IR ANGIOGRAM VISCERAL SELECTIVE  11/28/2016   IR ANGIOGRAM VISCERAL SELECTIVE  12/13/2016   IR ANGIOGRAM VISCERAL SELECTIVE  12/13/2016   IR ANGIOGRAM VISCERAL SELECTIVE  01/09/2017   IR EMBO ARTERIAL NOT HEMORR HEMANG INC GUIDE ROADMAPPING  11/28/2016   IR EMBO TUMOR ORGAN  ISCHEMIA INFARCT INC GUIDE ROADMAPPING  12/13/2016   IR EMBO TUMOR ORGAN ISCHEMIA INFARCT INC GUIDE ROADMAPPING  01/09/2017   IR IVC FILTER PLMT / S&I /IMG GUID/MOD SED  04/24/2017   IR RADIOLOGIST EVAL & MGMT  11/06/2016   IR RADIOLOGIST EVAL & MGMT  01/02/2017   IR RADIOLOGIST EVAL & MGMT  02/05/2017   IR RADIOLOGIST EVAL & MGMT  05/02/2017   IR RADIOLOGIST EVAL & MGMT  10/17/2017   IR US GUIDE VASC ACCESS RIGHT  11/28/2016   IR US GUIDE VASC ACCESS RIGHT  12/13/2016   IR US GUIDE VASC ACCESS RIGHT  01/09/2017   KNEE ARTHROSCOPY Right 11/14/2006   KNEE ARTHROSCOPY Bilateral 5 and 6 years ago   KNEE ARTHROSCOPY WITH LATERAL MENISECTOMY  07/03/2012   Procedure: KNEE ARTHROSCOPY WITH LATERAL MENISECTOMY;  Surgeon: Magnus Sinning, MD;  Location: [facility];  Service: Orthopedics;  Laterality: Left;  with Partial Lateral Menisectomy and Medial Menisectomy. Shaving of medial and lateral femoral condyles. Shaving of patella. Removal of a loose body   LOOP RECORDER REMOVAL N/A 05/22/2018   Procedure: LOOP RECORDER REMOVAL;  Surgeon: Thompson Grayer, MD;  Location: [facility];  Service: Cardiovascular;  Laterality: N/A;   PACEMAKER IMPLANT N/A 05/22/2018   MDT Azure XT DR MRI implanted by Dr Rayann Heman for sick sinus syndrome   POLYPECTOMY  11/07/2019   Procedure: POLYPECTOMY;  Surgeon: Yetta Flock, MD;  [facility];  Service: Gastroenterology;;   tibial and fibular internal fixation Left    TOTAL ABDOMINAL HYSTERECTOMY  86 years old   [facility]  2009, 2013    REVIEW OF SYSTEMS:  A comprehensive review of systems was negative except for: Constitutional: positive for fatigue Respiratory: positive for dyspnea on exertion   PHYSICAL EXAMINATION: General appearance: alert, cooperative, fatigued and no distress Head: Normocephalic, without obvious abnormality, atraumatic Neck: no adenopathy, no JVD, supple, symmetrical, trachea midline and thyroid not  enlarged, symmetric, no tenderness/mass/nodules Lymph nodes: Cervical, supraclavicular, and axillary nodes normal. Resp: clear to auscultation bilaterally Back: symmetric, no curvature. ROM normal. No CVA tenderness. Cardio: regular rate and rhythm, S1, S2 normal, no murmur, click, rub or gallop GI: soft, non-tender; bowel sounds normal; no masses,  no organomegaly Extremities: extremities normal, atraumatic, no cyanosis or edema  ECOG PERFORMANCE STATUS: 2 -  Symptomatic, <50% confined to bed  Blood pressure (!) 152/70, pulse (!) 59, temperature (!) 97.5 F (36.4 C), temperature source Tympanic, resp. rate 18, height 5\' 4"  (1.626 m), SpO2 99 %.  LABORATORY DATA: Lab Results  Component Value Date   WBC 2.7 (L) 05/09/2020   HGB 8.9 (L) 05/09/2020   HCT 29.2 (L) 05/09/2020   MCV 76.2 (L) 05/09/2020   PLT 99 (L) 05/09/2020      Chemistry      Component Value Date/Time   NA 125 (L) 05/09/2020 1107   NA 138 11/18/2019 1000   NA 135 (L) 07/10/2017 1142   K 4.6 05/09/2020 1107   K 4.6 07/10/2017 1142   CL 96 (L) 05/09/2020 1107   CO2 27 05/09/2020 1107   CO2 25 07/10/2017 1142   BUN 14 05/09/2020 1107   BUN 11 11/18/2019 1000   BUN 11.4 07/10/2017 1142   CREATININE 0.90 05/09/2020 1107   CREATININE 1.0 07/10/2017 1142      Component Value Date/Time   CALCIUM 9.4 05/09/2020 1107   CALCIUM 9.9 07/10/2017 1142   ALKPHOS 103 05/09/2020 1107   ALKPHOS 93 07/10/2017 1142   AST 16 05/09/2020 1107   AST 43 (H) 07/10/2017 1142   ALT 10 05/09/2020 1107   ALT 28 07/10/2017 1142   BILITOT 0.3 05/09/2020 1107   BILITOT 0.56 07/10/2017 1142       RADIOGRAPHIC STUDIES: CT Chest W Contrast  Result Date: 05/09/2020 CLINICAL DATA:  Non-small cell lung cancer, follow-up EXAM: CT CHEST, ABDOMEN, AND PELVIS WITH CONTRAST TECHNIQUE: Multidetector CT imaging of the chest, abdomen and pelvis was performed following the standard protocol during bolus administration of intravenous contrast.  CONTRAST:  157mL OMNIPAQUE IOHEXOL 300 MG/ML  SOLN COMPARISON:  02/05/2020 FINDINGS: CT CHEST FINDINGS Cardiovascular: Heart is normal in size. No pericardial effusion. Left subclavian pacemaker. Ectasia of the ascending thoracic aorta, measuring 4.0 cm, grossly unchanged. Atherosclerotic calcifications of the aortic arch. Mild prominence of the main pulmonary artery, raising the possibility of pulmonary arterial hypertension. Coronary atherosclerosis of the LAD. Mediastinum/Nodes: No suspicious mediastinal lymphadenopathy. Small bilateral axillary nodes, measuring up to 9 mm short axis on the right (series 3/image 19), likely reactive. Lungs/Pleura: Ground-glass opacity/mosaic attenuation in the lungs bilaterally, mildly progressive, favoring air trapping. Band like opacity in the anterior right upper lobe measures 1.0 x 2.4 cm (series 7/image 50), unchanged. Numerous subcentimeter bilateral pulmonary nodules in the lungs bilaterally, difficult to discretely compared due to motion degradation on both the current and prior study, although no convincing progression is present. No focal consolidation. No pleural effusion or pneumothorax. Musculoskeletal: Old sternal fracture deformity. Otherwise, no focal osseous lesions. CT ABDOMEN PELVIS FINDINGS Hepatobiliary: Limited evaluation due to motion degradation. Dominant 3.4 x 4.0 cm lesion in segment 3 (series 3/image 49), previously 3.8 x 3.6 cm, unchanged. However, some of the smaller lesions are favored to be mildly progressive: --2.3 cm lesion in segment 2 (series 3/image 41), previously 1.8 cm --16 mm lesion in segment 8 (series 3/image 38), previously 11 mm --10 mm lesion in segment 5 (series 3/image 48), previously 7 mm Contracted gallbladder with cholelithiasis (series 3/image 48). Stable mild central left intrahepatic ductal prominence (series 3/image 43). No extrahepatic ductal dilatation. Pancreas: Within normal limits. Spleen: Within normal limits.  Adrenals/Urinary Tract: Adrenal glands are within normal limits. Kidneys are within normal limits.  No hydronephrosis. Bladder is within normal limits. Stomach/Bowel: Surgical clips in the stomach, otherwise within normal limits. No evidence of bowel  obstruction. Suspected prior appendectomy. No colonic wall thickening or mass is evident on CT. Vascular/Lymphatic: No evidence of abdominal aortic aneurysm. Atherosclerotic calcifications of the abdominal aorta and branch vessels. IVC filter. No suspicious abdominopelvic lymphadenopathy. Reproductive: Status post hysterectomy. No adnexal masses. Other: No abdominopelvic ascites. Musculoskeletal: Mild degenerative changes of the lumbar spine. IMPRESSION: Motion degraded images, which constraints evaluation. Numerous bilateral pulmonary nodules/metastases. Overall appearance is favored to be grossly unchanged. Dominant hepatic metastasis in segment 3 is unchanged. However, mild progression of smaller hepatic metastases is favored. Additional stable ancillary findings as above. Electronically Signed   By: Julian Hy M.D.   On: 05/09/2020 14:22   CT Abdomen Pelvis W Contrast  Result Date: 05/09/2020 CLINICAL DATA:  Non-small cell lung cancer, follow-up EXAM: CT CHEST, ABDOMEN, AND PELVIS WITH CONTRAST TECHNIQUE: Multidetector CT imaging of the chest, abdomen and pelvis was performed following the standard protocol during bolus administration of intravenous contrast. CONTRAST:  156mL OMNIPAQUE IOHEXOL 300 MG/ML  SOLN COMPARISON:  02/05/2020 FINDINGS: CT CHEST FINDINGS Cardiovascular: Heart is normal in size. No pericardial effusion. Left subclavian pacemaker. Ectasia of the ascending thoracic aorta, measuring 4.0 cm, grossly unchanged. Atherosclerotic calcifications of the aortic arch. Mild prominence of the main pulmonary artery, raising the possibility of pulmonary arterial hypertension. Coronary atherosclerosis of the LAD. Mediastinum/Nodes: No suspicious  mediastinal lymphadenopathy. Small bilateral axillary nodes, measuring up to 9 mm short axis on the right (series 3/image 19), likely reactive. Lungs/Pleura: Ground-glass opacity/mosaic attenuation in the lungs bilaterally, mildly progressive, favoring air trapping. Band like opacity in the anterior right upper lobe measures 1.0 x 2.4 cm (series 7/image 50), unchanged. Numerous subcentimeter bilateral pulmonary nodules in the lungs bilaterally, difficult to discretely compared due to motion degradation on both the current and prior study, although no convincing progression is present. No focal consolidation. No pleural effusion or pneumothorax. Musculoskeletal: Old sternal fracture deformity. Otherwise, no focal osseous lesions. CT ABDOMEN PELVIS FINDINGS Hepatobiliary: Limited evaluation due to motion degradation. Dominant 3.4 x 4.0 cm lesion in segment 3 (series 3/image 49), previously 3.8 x 3.6 cm, unchanged. However, some of the smaller lesions are favored to be mildly progressive: --2.3 cm lesion in segment 2 (series 3/image 41), previously 1.8 cm --16 mm lesion in segment 8 (series 3/image 38), previously 11 mm --10 mm lesion in segment 5 (series 3/image 48), previously 7 mm Contracted gallbladder with cholelithiasis (series 3/image 48). Stable mild central left intrahepatic ductal prominence (series 3/image 43). No extrahepatic ductal dilatation. Pancreas: Within normal limits. Spleen: Within normal limits. Adrenals/Urinary Tract: Adrenal glands are within normal limits. Kidneys are within normal limits.  No hydronephrosis. Bladder is within normal limits. Stomach/Bowel: Surgical clips in the stomach, otherwise within normal limits. No evidence of bowel obstruction. Suspected prior appendectomy. No colonic wall thickening or mass is evident on CT. Vascular/Lymphatic: No evidence of abdominal aortic aneurysm. Atherosclerotic calcifications of the abdominal aorta and branch vessels. IVC filter. No suspicious  abdominopelvic lymphadenopathy. Reproductive: Status post hysterectomy. No adnexal masses. Other: No abdominopelvic ascites. Musculoskeletal: Mild degenerative changes of the lumbar spine. IMPRESSION: Motion degraded images, which constraints evaluation. Numerous bilateral pulmonary nodules/metastases. Overall appearance is favored to be grossly unchanged. Dominant hepatic metastasis in segment 3 is unchanged. However, mild progression of smaller hepatic metastases is favored. Additional stable ancillary findings as above. Electronically Signed   By: Julian Hy M.D.   On: 05/09/2020 14:22    ASSESSMENT AND PLAN:  This is a very pleasant 86  years old white female with  metastatic intermediate grade neuroendocrine carcinoma of questionable lung primary and multiple metastatic liver lesions and pancreatic lesions.She status post treatment with radio embolization with Y90 to the left and right lobe liver lesions.Status post treatment with Y 90 to the left and right lobes of the liver. She is currently undergoing systemic chemotherapy with Xeloda and Temodar status post 43 cycles.   The patient has been tolerating the treatment well except for the increasing fatigue and weakness secondary to anemia likely chemotherapy-induced versus iron deficiency anemia secondary to gastrointestinal blood loss.  She is currently off treatment with Xarelto. The patient is currently on observation and she is feeling fine today with no concerning complaints. She had repeat CT scan of the chest, abdomen pelvis performed recently.  I personally and independently reviewed the scans and discussed the results with the patient and her husband. Her scan showed no concerning findings for disease progression. I recommended for her to continue on observation with repeat CT scan of the chest, abdomen and pelvis in 3 months. For the anxiety, I will give her a refill of Xanax. For the deficiency anemia, I recommended for the patient  to start Integra +1 capsule p.o. daily.  I will also check her anemia panel today to rule out any other underlying etiology. For hypertension she will continue with her current blood pressure medication and routine follow-up visit by her primary care physician. The patient was advised to call immediately if she has any concerning symptoms in the interval. The patient voices understanding of current disease status and treatment options and is in agreement with the current care plan. All questions were answered. The patient knows to call the clinic with any problems, questions or concerns. We can certainly see the patient much sooner if necessary.  Disclaimer: This note was dictated with voice recognition software. Similar sounding words can inadvertently be transcribed and may not be corrected upon review.

## 2020-05-12 LAB — ERYTHROPOIETIN: Erythropoietin: 105.9 m[IU]/mL — ABNORMAL HIGH (ref 2.6–18.5)

## 2020-05-18 ENCOUNTER — Other Ambulatory Visit: Payer: Self-pay

## 2020-05-18 ENCOUNTER — Ambulatory Visit (INDEPENDENT_AMBULATORY_CARE_PROVIDER_SITE_OTHER): Payer: Medicare Other

## 2020-05-18 DIAGNOSIS — Z23 Encounter for immunization: Secondary | ICD-10-CM | POA: Diagnosis not present

## 2020-05-30 ENCOUNTER — Ambulatory Visit (INDEPENDENT_AMBULATORY_CARE_PROVIDER_SITE_OTHER): Payer: Medicare Other

## 2020-05-30 DIAGNOSIS — R001 Bradycardia, unspecified: Secondary | ICD-10-CM

## 2020-05-31 LAB — CUP PACEART REMOTE DEVICE CHECK
Battery Remaining Longevity: 143 mo
Battery Voltage: 3.03 V
Brady Statistic AP VP Percent: 0.18 %
Brady Statistic AP VS Percent: 99.51 %
Brady Statistic AS VP Percent: 0 %
Brady Statistic AS VS Percent: 0.31 %
Brady Statistic RA Percent Paced: 99.75 %
Brady Statistic RV Percent Paced: 0.18 %
Date Time Interrogation Session: 20211101065733
Implantable Lead Implant Date: 20191024
Implantable Lead Implant Date: 20191024
Implantable Lead Location: 753859
Implantable Lead Location: 753860
Implantable Lead Model: 5076
Implantable Lead Model: 5076
Implantable Pulse Generator Implant Date: 20191024
Lead Channel Impedance Value: 285 Ohm
Lead Channel Impedance Value: 304 Ohm
Lead Channel Impedance Value: 342 Ohm
Lead Channel Impedance Value: 494 Ohm
Lead Channel Pacing Threshold Amplitude: 0.75 V
Lead Channel Pacing Threshold Amplitude: 1.5 V
Lead Channel Pacing Threshold Pulse Width: 0.4 ms
Lead Channel Pacing Threshold Pulse Width: 0.4 ms
Lead Channel Sensing Intrinsic Amplitude: 16.125 mV
Lead Channel Sensing Intrinsic Amplitude: 16.125 mV
Lead Channel Sensing Intrinsic Amplitude: 2.125 mV
Lead Channel Sensing Intrinsic Amplitude: 2.125 mV
Lead Channel Setting Pacing Amplitude: 1.5 V
Lead Channel Setting Pacing Amplitude: 3 V
Lead Channel Setting Pacing Pulse Width: 0.4 ms
Lead Channel Setting Sensing Sensitivity: 1.2 mV

## 2020-06-01 NOTE — Progress Notes (Signed)
Remote pacemaker transmission.   

## 2020-06-03 ENCOUNTER — Other Ambulatory Visit: Payer: Self-pay | Admitting: Family Medicine

## 2020-06-06 ENCOUNTER — Other Ambulatory Visit: Payer: Self-pay | Admitting: Family Medicine

## 2020-06-06 DIAGNOSIS — Z889 Allergy status to unspecified drugs, medicaments and biological substances status: Secondary | ICD-10-CM

## 2020-06-10 ENCOUNTER — Other Ambulatory Visit: Payer: Self-pay | Admitting: Family Medicine

## 2020-06-10 DIAGNOSIS — F411 Generalized anxiety disorder: Secondary | ICD-10-CM

## 2020-06-10 DIAGNOSIS — F331 Major depressive disorder, recurrent, moderate: Secondary | ICD-10-CM

## 2020-06-15 ENCOUNTER — Other Ambulatory Visit: Payer: Self-pay | Admitting: Family Medicine

## 2020-06-15 DIAGNOSIS — F411 Generalized anxiety disorder: Secondary | ICD-10-CM

## 2020-06-15 DIAGNOSIS — F32A Depression, unspecified: Secondary | ICD-10-CM

## 2020-06-21 ENCOUNTER — Ambulatory Visit (INDEPENDENT_AMBULATORY_CARE_PROVIDER_SITE_OTHER): Payer: Medicare Other | Admitting: Family Medicine

## 2020-06-21 ENCOUNTER — Encounter: Payer: Self-pay | Admitting: Family Medicine

## 2020-06-21 ENCOUNTER — Other Ambulatory Visit: Payer: Self-pay

## 2020-06-21 VITALS — BP 138/72 | HR 82 | Temp 97.4°F | Resp 20

## 2020-06-21 DIAGNOSIS — B029 Zoster without complications: Secondary | ICD-10-CM

## 2020-06-21 MED ORDER — BETAMETHASONE SOD PHOS & ACET 6 (3-3) MG/ML IJ SUSP
6.0000 mg | Freq: Once | INTRAMUSCULAR | Status: AC
  Administered 2020-06-21: 6 mg via INTRAMUSCULAR

## 2020-06-21 MED ORDER — TRAMADOL HCL 50 MG PO TABS: 50.0000 mg | ORAL_TABLET | Freq: Four times a day (QID) | ORAL | 0 refills | Status: AC

## 2020-06-21 MED ORDER — VALACYCLOVIR HCL 1 G PO TABS: 1000.0000 mg | ORAL_TABLET | Freq: Three times a day (TID) | ORAL | 0 refills | Status: DC

## 2020-06-21 NOTE — Progress Notes (Signed)
No chief complaint on file.   HPI  Patient presents today for painful rash at the right flank area.  It started about 2 weeks ago and was just some red dots on the back.  Since then it has spread from the back around the flank to the anterior abdomen.  It now has multiple blisters associated as well. Patient says it is extremely painful.  The pain is constant.  She could not describe it. PMH: Smoking status noted ROS: Per HPI  Objective: BP 138/72   Pulse 82   Temp (!) 97.4 F (36.3 C) (Temporal)   Resp 20   SpO2 97%  Gen: NAD, alert, cooperative with exam HEENT: NCAT, EOMI, PERRL CV: RRR, good S1/S2, no murmur Resp: CTABL, no wheezes, non-labored Skin: There is a papulovesicular eruption with multiple confluent vesicles to form blisters reaching from the right side paraspinous region at approximately L4 all the way around to the midclavicular line approximately 2 to 3 inches in length top to bottom. Ext: No edema, warm Neuro: Alert and oriented, No gross deficits  Assessment and plan:  1. Herpes zoster without complication     Meds ordered this encounter  Medications  . valACYclovir (VALTREX) 1000 MG tablet    Sig: Take 1 tablet (1,000 mg total) by mouth 3 (three) times daily.    Dispense:  21 tablet    Refill:  0  . traMADol (ULTRAM) 50 MG tablet    Sig: Take 1 tablet (50 mg total) by mouth 4 (four) times daily for 5 days. 1-2 tablets up to 4 times a day as needed for pain    Dispense:  20 tablet    Refill:  0  . betamethasone acetate-betamethasone sodium phosphate (CELESTONE) injection 6 mg     Follow up 1 month and as needed.  [name], MD

## 2020-06-28 ENCOUNTER — Other Ambulatory Visit: Payer: Self-pay | Admitting: Family Medicine

## 2020-06-28 DIAGNOSIS — I10 Essential (primary) hypertension: Secondary | ICD-10-CM

## 2020-06-30 ENCOUNTER — Other Ambulatory Visit: Payer: Self-pay | Admitting: Family Medicine

## 2020-07-04 ENCOUNTER — Other Ambulatory Visit: Payer: Self-pay | Admitting: Family Medicine

## 2020-07-04 DIAGNOSIS — I2699 Other pulmonary embolism without acute cor pulmonale: Secondary | ICD-10-CM

## 2020-07-08 ENCOUNTER — Ambulatory Visit (INDEPENDENT_AMBULATORY_CARE_PROVIDER_SITE_OTHER): Payer: Medicare Other | Admitting: Family Medicine

## 2020-07-08 ENCOUNTER — Other Ambulatory Visit: Payer: Self-pay

## 2020-07-08 ENCOUNTER — Encounter: Payer: Self-pay | Admitting: Family Medicine

## 2020-07-08 VITALS — BP 177/67 | HR 60 | Temp 97.0°F | Ht 60.0 in

## 2020-07-08 DIAGNOSIS — F411 Generalized anxiety disorder: Secondary | ICD-10-CM | POA: Diagnosis not present

## 2020-07-08 DIAGNOSIS — E039 Hypothyroidism, unspecified: Secondary | ICD-10-CM | POA: Diagnosis not present

## 2020-07-08 DIAGNOSIS — K219 Gastro-esophageal reflux disease without esophagitis: Secondary | ICD-10-CM

## 2020-07-08 DIAGNOSIS — F331 Major depressive disorder, recurrent, moderate: Secondary | ICD-10-CM | POA: Diagnosis not present

## 2020-07-08 DIAGNOSIS — I10 Essential (primary) hypertension: Secondary | ICD-10-CM

## 2020-07-08 MED ORDER — HYDROCODONE-ACETAMINOPHEN 5-325 MG PO TABS
1.0000 | ORAL_TABLET | Freq: Four times a day (QID) | ORAL | 0 refills | Status: DC | PRN
Start: 2020-07-08 — End: 2020-09-07

## 2020-07-08 MED ORDER — PREDNISONE 10 MG PO TABS: ORAL_TABLET | ORAL | 0 refills | Status: DC

## 2020-07-08 MED ORDER — AMLODIPINE BESYLATE 10 MG PO TABS: 10.0000 mg | ORAL_TABLET | Freq: Every day | ORAL | 1 refills | Status: DC

## 2020-07-08 MED ORDER — QUETIAPINE FUMARATE 300 MG PO TABS: 300.0000 mg | ORAL_TABLET | Freq: Every day | ORAL | 2 refills | Status: AC

## 2020-07-08 NOTE — Progress Notes (Signed)
Subjective:  Patient ID: [name], female    DOB: Jul 26, 1934  Age: 86 y.o. MRN: [id number]  CC: Medical Management of Chronic Issues, Herpes Zoster, and Mass (On back noticed about a month ago, no color to it its not sore )   HPI [name] presents for concerns for depression.  Her husband feels that she also has dementia.  He says that even though he is with her 24/7 she thinks he is running around on her.  She voices that during the visit as well.  During conversation is clear she does not know the day or date but she does know the month and season.  I asked her to recall 3 objects and she could not after 5 minutes name any of those.  Patient has used 2 courses of 1 week of valacyclovir for shingles and still has some breaking out on the right lower abdomen and back radiating in a dermatomal distribution.  She states pain is 10/10.  Depression screen [facility] 2/9 07/08/2020 06/21/2020 01/04/2020  Decreased Interest 3 0 2  Down, Depressed, Hopeless 3 0 0  PHQ - 2 Score 6 0 2  Altered sleeping 0 - 1  Tired, decreased energy 3 - 2  Change in appetite 0 - 0  Feeling bad or failure about yourself  3 - 1  Trouble concentrating 3 - 0  Moving slowly or fidgety/restless 3 - 2  Suicidal thoughts 3 - 0  PHQ-9 Score 21 - 8  Difficult doing work/chores - - Somewhat difficult  Some recent data might be hidden    History [name] has a past medical history of Acute respiratory failure with hypoxia ([place]) (10/23/2015), Allergic rhinitis, Anxiety, Aortic insufficiency, Arthritis, Ataxia, Back pain (12/13/2016), Bradycardia, Burning tongue syndrome (25 years), Cataract, Cerebellar degeneration ([place]), Chest pain (12/13/2016), Chronic urinary tract infection, Complication of anesthesia, CVA (cerebral infarction) (05/2003), Dehydration with hyponatremia (03/23/2018), Depression, DVT (deep venous thrombosis) ([place]), Encounter for antineoplastic chemotherapy (10/09/2016), Fatigue (01/03/2015), Gait disorder,  Gastric polyps, GERD (gastroesophageal reflux disease), Goals of care, counseling/discussion (10/09/2016), H/O: CVA (cerebrovascular accident) (07/14/2016), High cholesterol, History of pericarditis, Hyperlipidemia, Hypotension, Hypothyroidism, IBS (irritable bowel syndrome), Liver cancer ([place]) (dx'd 06/2016), Lung cancer ([place]), Neuroendocrine cancer ([place]) (08/23/2016), Obesity, Paroxysmal atrial fibrillation ([place]), Pericarditis, Personal history of arterial venous malformation (AVM), Pulmonary embolism ([place]), Seizure disorder ([place]), Seizures ([place]) (2003), Shortness of breath dyspnea, Sick sinus syndrome ([place]), Sternum fx (10/27/2013), Stroke ([place]) (5 years ago), Thyroid disease, TIA (transient ischemic attack), and UTI (lower urinary tract infection) (03/27/2016).   She has a past surgical history that includes Knee arthroscopy (Right, 11/14/2006); tibial and fibular internal fixation (Left); Upper gastrointestinal endoscopy (2009, 2013); Colonoscopy (2006, 2009); Appendectomy (86 years old); cyst removed (35 years ago); Total abdominal hysterectomy (86 years old); Knee arthroscopy (Bilateral, 5 and 6 years ago); Knee arthroscopy with lateral menisectomy (07/03/2012); Cardiac catheterization (N/A, 01/06/2015); Eye surgery (Right); Colonoscopy with propofol (N/A, 03/28/2016); IR Angiogram Visceral Selective (11/28/2016); IR Angiogram Selective Each Additional Vessel (11/28/2016); IR US Guide Vasc Access Right (11/28/2016); IR Angiogram Selective Each Additional Vessel (11/28/2016); IR Angiogram Visceral Selective (11/28/2016); IR Angiogram Selective Each Additional Vessel (11/28/2016); IR EMBO ARTERIAL NOT HEMORR HEMANG INC GUIDE ROADMAPPING (11/28/2016); IR Angiogram Selective Each Additional Vessel (11/28/2016); IR Angiogram Selective Each Additional Vessel (12/13/2016); IR Angiogram Visceral Selective (12/13/2016); IR Angiogram Selective Each Additional Vessel (12/13/2016); IR US Guide Vasc Access Right (12/13/2016); IR Angiogram Visceral  Selective (12/13/2016); IR EMBO TUMOR ORGAN ISCHEMIA INFARCT INC GUIDE ROADMAPPING (  12/13/2016); IR Angiogram Selective Each Additional Vessel (01/09/2017); IR EMBO TUMOR ORGAN ISCHEMIA INFARCT INC GUIDE ROADMAPPING (01/09/2017); IR US Guide Vasc Access Right (01/09/2017); IR Angiogram Visceral Selective (01/09/2017); IR Radiologist Eval & Mgmt (11/06/2016); IR Radiologist Eval & Mgmt (01/02/2017); IR IVC FILTER PLMT / S&I /IMG GUID/MOD SED (04/24/2017); IR Radiologist Eval & Mgmt (02/05/2017); IR Radiologist Eval & Mgmt (05/02/2017); IR Radiologist Eval & Mgmt (10/17/2017); CORONARY ANGIOGRAPHY (N/A, 05/22/2018); PACEMAKER IMPLANT (N/A, 05/22/2018); LOOP RECORDER REMOVAL (N/A, 05/22/2018); Esophagogastroduodenoscopy (egd) with propofol (N/A, 11/07/2019); Hemostasis control (11/07/2019); Hemostasis clip placement (11/07/2019); and polypectomy (11/07/2019).   Her family history includes COPD in her daughter; Colon cancer (age of onset: 27) in her brother; Colon polyps in her brother; Coronary artery disease in her brother; Diabetes in her brother and brother; Heart attack (age of onset: 76) in her father; Peptic Ulcer in her mother; Prostate cancer in her brother and son.She reports that she quit smoking about 44 years ago. Her smoking use included cigarettes. She has a 2.50 pack-year smoking history. She has never used smokeless tobacco. She reports that she does not drink alcohol and does not use drugs.    ROS Review of Systems  Constitutional: Negative.   HENT: Negative for congestion.   Eyes: Negative for visual disturbance.  Respiratory: Negative for shortness of breath.   Cardiovascular: Negative for chest pain.  Gastrointestinal: Negative for abdominal pain, constipation, diarrhea, nausea and vomiting.  Genitourinary: Negative for difficulty urinating.  Musculoskeletal: Negative for arthralgias and myalgias.  Neurological: Negative for headaches.  Psychiatric/Behavioral: Positive for confusion (Patient has  some delusions as well as forgetfulness) and decreased concentration. Negative for sleep disturbance.    Objective:  BP (!) 177/67    Pulse 60    Temp (!) 97 F (36.1 C) (Temporal)    Ht 5' (1.524 m)    BMI 40.04 kg/m   BP Readings from Last 3 Encounters:  07/08/20 (!) 177/67  06/21/20 138/72  05/11/20 (!) 152/70    Wt Readings from Last 3 Encounters:  05/03/20 205 lb (93 kg)  02/09/20 200 lb 1.6 oz (90.8 kg)  12/04/19 208 lb (94.3 kg)     Physical Exam Constitutional:      General: She is not in acute distress.    Appearance: She is well-developed and well-nourished.  HENT:     Head: Normocephalic and atraumatic.  Eyes:     Conjunctiva/sclera: Conjunctivae normal.     Pupils: Pupils are equal, round, and reactive to light.  Neck:     Thyroid: No thyromegaly.  Cardiovascular:     Rate and Rhythm: Normal rate and regular rhythm.     Heart sounds: Normal heart sounds. No murmur heard.   Pulmonary:     Effort: Pulmonary effort is normal. No respiratory distress.     Breath sounds: Normal breath sounds. No wheezing or rales.  Abdominal:     General: Bowel sounds are normal. There is no distension.     Palpations: Abdomen is soft.     Tenderness: There is no abdominal tenderness.  Musculoskeletal:        General: Normal range of motion.     Cervical back: Normal range of motion and neck supple.  Lymphadenopathy:     Cervical: No cervical adenopathy.  Skin:    General: Skin is warm and dry.     Findings: Rash (Typical dermatomal distribution of erythema with dried vesicles radiating from the L4 region near the midline in the back to the midclavicular line  in the front.) present.  Neurological:     Mental Status: She is alert and oriented to person, place, and time.  Psychiatric:        Mood and Affect: Mood and affect normal.        Behavior: Behavior normal.        Thought Content: Thought content normal.        Judgment: Judgment normal.       Assessment &  Plan:   Miakoda was seen today for medical management of chronic issues, herpes zoster and mass.  Diagnoses and all orders for this visit:  Essential hypertension  Gastroesophageal reflux disease without esophagitis  Hypothyroidism, unspecified type  GAD (generalized anxiety disorder) -     QUEtiapine (SEROQUEL) 300 MG tablet; Take 1 tablet (300 mg total) by mouth at bedtime.  Moderate episode of recurrent major depressive disorder ([name]) -     QUEtiapine (SEROQUEL) 300 MG tablet; Take 1 tablet (300 mg total) by mouth at bedtime.  Accelerated hypertension -     amLODipine (NORVASC) 10 MG tablet; Take 1 tablet (10 mg total) by mouth daily.  Other orders -     HYDROcodone-acetaminophen (NORCO/VICODIN) 5-325 MG tablet; Take 1 tablet by mouth every 6 (six) hours as needed for moderate pain. -     predniSONE (DELTASONE) 10 MG tablet; Take 5 daily for 3 days followed by 4,3,2 and 1 for 3 days each.       I have changed [place]. Chopp's QUEtiapine and amLODipine. I am also having her start on HYDROcodone-acetaminophen and predniSONE. Additionally, I am having her maintain her Melatonin, hydroxypropyl methylcellulose / hypromellose, silver sulfADIAZINE, furosemide, lovastatin, Synthroid, pantoprazole, ALPRAZolam, Integra Plus, gabapentin, ondansetron, fluticasone, escitalopram, OXcarbazepine, valACYclovir, and Xarelto.  Allergies as of 07/08/2020      Reactions   Levaquin [levofloxacin] Shortness Of Breath   Lisinopril Cough      Medication List       Accurate as of July 08, 2020 11:59 PM. If you have any questions, ask your nurse or doctor.        ALPRAZolam 0.25 MG tablet Commonly known as: XANAX TAKE 1 TABLET BY MOUTH AT BEDTIME AS NEEDED FOR ANXIETY   amLODipine 10 MG tablet Commonly known as: NORVASC Take 1 tablet (10 mg total) by mouth daily. What changed:   medication strength  how much to take Changed by: [name], MD   escitalopram 20 MG  tablet Commonly known as: LEXAPRO TAKE 1 TABLET AT BEDTIME   fluticasone 50 MCG/ACT nasal spray Commonly known as: FLONASE USE 2 SPRAYS IN EACH NOSTRIL AT BEDTIME   furosemide 20 MG tablet Commonly known as: LASIX TAKE 1 TABLET DAILY AS NEEDED FOR FLUID RETENTION What changed: See the new instructions.   gabapentin 400 MG capsule Commonly known as: NEURONTIN 2 CAPSULES THREE TIMES A DAY   HYDROcodone-acetaminophen 5-325 MG tablet Commonly known as: NORCO/VICODIN Take 1 tablet by mouth every 6 (six) hours as needed for moderate pain. Started by: [name], MD   hydroxypropyl methylcellulose / hypromellose 2.5 % ophthalmic solution Commonly known as: ISOPTO TEARS / GONIOVISC Place 1 drop into both eyes daily.   Integra Plus Caps Take 1 capsule by mouth every morning.   lovastatin 40 MG tablet Commonly known as: MEVACOR TAKE 1 TABLET AT BEDTIME   Melatonin 3 MG Tbdp Take 3-6 mg by mouth at bedtime as needed. What changed: reasons to take this   ondansetron 8 MG tablet Commonly known as: ZOFRAN  TAKE 1 TABLET EVERY 8 HOURS AS NEEDED FOR NAUSEA OR VOMITING   OXcarbazepine 150 MG tablet Commonly known as: TRILEPTAL TAKE 1 TABLET TWICE A DAY   pantoprazole 40 MG tablet Commonly known as: PROTONIX Take 1 tablet (40 mg total) by mouth 2 (two) times daily.   predniSONE 10 MG tablet Commonly known as: DELTASONE Take 5 daily for 3 days followed by 4,3,2 and 1 for 3 days each. Started by: [name], MD   QUEtiapine 300 MG tablet Commonly known as: SEROQUEL Take 1 tablet (300 mg total) by mouth at bedtime. What changed:   medication strength  how much to take Changed by: [name], MD   silver sulfADIAZINE 1 % cream Commonly known as: Silvadene Apply 1 application topically daily.   Synthroid 75 MCG tablet Generic drug: levothyroxine TAKE 1 TABLET DAILY BEFORE BREAKFAST   valACYclovir 1000 MG tablet Commonly known as: VALTREX TAKE  (1)  TABLET   THREE TIMES DAILY.   Xarelto 20 MG Tabs tablet Generic drug: rivaroxaban TAKE 1 TABLET DAILY WITH SUPPER      Psychology referral recommended.  List of potential practices given.  Follow-up: Return in about 3 months (around 10/06/2020).  [name], M.D.

## 2020-07-08 NOTE — Patient Instructions (Signed)
Your provider wants you to schedule an appointment with a Psychologist/Psychiatrist. The following list of offices requires the patient to call and make their own appointment, as there is information they need that only you can provide. Please feel free to choose form the following providers:  [facility] in [place]  [facility]  [phone] [place], [place]  (Scheduled through [facility]) Must call and do an interview for appointment. Sees Children / Accepts Medicaid  [facility] [place]  [address] [place], [place]  [phone] [address] [place], [place] for Autism but does not treat it Sees Children / Accepts Medicaid  [facility]    [phone] [address]   [place], [place] Medication management, substance abuse, bipolar, grief, family, marriage, OCD, anxiety, PTSD Sees children / Accepts Medicaid  [facility]    [phone] [address], [place], [place] children / Accepts [facility]  [facility]  [phone] [address] [place], [place]   Dr Lorenza Evangelist     [phone] [address], [place], [place]  Sees ADD & ADHD for treatment Accepts [facility]  [facility]  [phone] [place], [place] for Autism Accepts Medicaid   [facility]   [phone] [address], [place] children as young as 84 years old Accepts [facility]     [phone]    [place],  [address] Sees children Accepts Medicaid

## 2020-07-12 ENCOUNTER — Encounter: Payer: Self-pay | Admitting: Family Medicine

## 2020-07-19 ENCOUNTER — Encounter (HOSPITAL_COMMUNITY): Payer: Self-pay

## 2020-07-19 ENCOUNTER — Emergency Department (HOSPITAL_COMMUNITY): Payer: Medicare Other

## 2020-07-19 ENCOUNTER — Emergency Department (HOSPITAL_COMMUNITY)
Admission: EM | Admit: 2020-07-19 | Discharge: 2020-07-19 | Disposition: A | Discharge status: Home / Self Care | Payer: Medicare Other | Attending: Emergency Medicine | Admitting: Emergency Medicine

## 2020-07-19 ENCOUNTER — Other Ambulatory Visit: Payer: Self-pay

## 2020-07-19 DIAGNOSIS — M25512 Pain in left shoulder: Secondary | ICD-10-CM | POA: Diagnosis not present

## 2020-07-19 DIAGNOSIS — I499 Cardiac arrhythmia, unspecified: Secondary | ICD-10-CM | POA: Diagnosis not present

## 2020-07-19 DIAGNOSIS — I6501 Occlusion and stenosis of right vertebral artery: Secondary | ICD-10-CM | POA: Diagnosis not present

## 2020-07-19 DIAGNOSIS — E039 Hypothyroidism, unspecified: Secondary | ICD-10-CM | POA: Diagnosis not present

## 2020-07-19 DIAGNOSIS — Z87891 Personal history of nicotine dependence: Secondary | ICD-10-CM | POA: Diagnosis not present

## 2020-07-19 DIAGNOSIS — Z85118 Personal history of other malignant neoplasm of bronchus and lung: Secondary | ICD-10-CM | POA: Insufficient documentation

## 2020-07-19 DIAGNOSIS — G510 Bell's palsy: Secondary | ICD-10-CM | POA: Insufficient documentation

## 2020-07-19 DIAGNOSIS — Z8505 Personal history of malignant neoplasm of liver: Secondary | ICD-10-CM | POA: Insufficient documentation

## 2020-07-19 DIAGNOSIS — I10 Essential (primary) hypertension: Secondary | ICD-10-CM | POA: Insufficient documentation

## 2020-07-19 DIAGNOSIS — Z20822 Contact with and (suspected) exposure to covid-19: Secondary | ICD-10-CM | POA: Insufficient documentation

## 2020-07-19 DIAGNOSIS — Z79899 Other long term (current) drug therapy: Secondary | ICD-10-CM | POA: Diagnosis not present

## 2020-07-19 DIAGNOSIS — G4489 Other headache syndrome: Secondary | ICD-10-CM | POA: Diagnosis not present

## 2020-07-19 DIAGNOSIS — R911 Solitary pulmonary nodule: Secondary | ICD-10-CM | POA: Diagnosis not present

## 2020-07-19 DIAGNOSIS — Z7901 Long term (current) use of anticoagulants: Secondary | ICD-10-CM | POA: Insufficient documentation

## 2020-07-19 DIAGNOSIS — R519 Headache, unspecified: Secondary | ICD-10-CM

## 2020-07-19 LAB — BASIC METABOLIC PANEL
Anion gap: 7 (ref 5–15)
BUN: 26 mg/dL — ABNORMAL HIGH (ref 8–23)
CO2: 24 mmol/L (ref 22–32)
Calcium: 9.6 mg/dL (ref 8.9–10.3)
Chloride: 104 mmol/L (ref 98–111)
Creatinine, Ser: 0.68 mg/dL (ref 0.44–1.00)
GFR, Estimated: >60 mL/min (ref >60)
Glucose, Bld: 109 mg/dL — ABNORMAL HIGH (ref 70–99)
Potassium: 4 mmol/L (ref 3.5–5.1)
Sodium: 135 mmol/L (ref 135–145)

## 2020-07-19 LAB — URINALYSIS, ROUTINE W REFLEX MICROSCOPIC
Bacteria, UA: NONE SEEN
Bilirubin Urine: NEGATIVE
Glucose, UA: NEGATIVE mg/dL
Hgb urine dipstick: NEGATIVE
Ketones, ur: NEGATIVE mg/dL
Leukocytes,Ua: NEGATIVE
Nitrite: POSITIVE — AB
Protein, ur: NEGATIVE mg/dL
Specific Gravity, Urine: 1.033 — ABNORMAL HIGH (ref 1.005–1.030)
pH: 6 (ref 5.0–8.0)

## 2020-07-19 LAB — CBC
HCT: 38.8 % (ref 36.0–46.0)
Hemoglobin: 12.4 g/dL (ref 12.0–15.0)
MCH: 29.5 pg (ref 26.0–34.0)
MCHC: 32 g/dL (ref 30.0–36.0)
MCV: 92.2 fL (ref 80.0–100.0)
Platelets: 98 10*3/uL — ABNORMAL LOW (ref 150–400)
RBC: 4.21 MIL/uL (ref 3.87–5.11)
RDW: 23.9 % — ABNORMAL HIGH (ref 11.5–15.5)
WBC: 7.7 10*3/uL (ref 4.0–10.5)
nRBC: 0 % (ref 0.0–0.2)

## 2020-07-19 LAB — PROTIME-INR
INR: 1.1 (ref 0.8–1.2)
Prothrombin Time: 13.7 seconds (ref 11.4–15.2)

## 2020-07-19 LAB — RESP PANEL BY RT-PCR (FLU A&B, COVID) ARPGX2
Influenza A by PCR: NEGATIVE
Influenza B by PCR: NEGATIVE
SARS Coronavirus 2 by RT PCR: NEGATIVE

## 2020-07-19 MED ORDER — CARBOXYMETHYLCELLUL-GLYCERIN 0.5-0.9 % OP SOLN: 1.0000 [drp] | OPHTHALMIC | 0 refills | Status: DC | PRN

## 2020-07-19 MED ORDER — IOHEXOL 350 MG/ML SOLN
100.0000 mL | Freq: Once | INTRAVENOUS | Status: AC | PRN
  Administered 2020-07-19: 18:00:00 75 mL via INTRAVENOUS

## 2020-07-19 MED ORDER — SODIUM CHLORIDE 0.9 % IV SOLN: INTRAVENOUS | Status: DC

## 2020-07-19 NOTE — ED Triage Notes (Signed)
Pt brought in from EMS due to headache that began suddenly at 1130 last night. Pt described the pain as horrible. Left eye lid noted not to be blinking like right lid

## 2020-07-19 NOTE — ED Notes (Signed)
Pt returned from CT °

## 2020-07-19 NOTE — ED Provider Notes (Signed)
[facility] EMERGENCY DEPARTMENT Provider Note   CSN: [id number] Arrival date & time: 07/19/20  1512     History Chief Complaint  Patient presents with  . Headache    [name] is a 86 y.o. female.  Pt presents to the ED today with a severe headache.  The pt said she had a severe headache last night, but it is gone now.  Pt lives at home with her husband.  She denies any other sx.    Pt's husband said she's been having intermittent headaches for the past month.  Last night, she had a bad headache starting at 11:30 which lasted about an hour.  It went away and she went back to sleep.  He said she's had headaches intermittently through out the day, but they have not been as bad.  She also has been more confused here today.        Past Medical History:  Diagnosis Date  . Acute respiratory failure with hypoxia ([place]) 10/23/2015  . Allergic rhinitis    PT. DENIES  . Anxiety   . Aortic insufficiency    Echo 04/29/2018: EF 65-70, mild AS (mean 13), mod AI, Asc Aorta 42 mm (mildly dilated), mild LAE, PASP 41, pericardium normal in appearance.   . Arthritis    NECK  . Ataxia   . Back pain 12/13/2016  . Bradycardia    primarily nocturnal  . Burning tongue syndrome 25 years  . Cataract   . Cerebellar degeneration ([place])   . Chest pain 12/13/2016   Atypical chest pain  . Chronic urinary tract infection   . Complication of anesthesia    low o2 sats, coded 30 years ago  . CVA (cerebral infarction) 05/2003  . Dehydration with hyponatremia 03/23/2018  . Depression   . DVT (deep venous thrombosis) ([place])   . Encounter for antineoplastic chemotherapy 10/09/2016  . Fatigue 01/03/2015  . Gait disorder   . Gastric polyps   . GERD (gastroesophageal reflux disease)   . Goals of care, counseling/discussion 10/09/2016  . H/O: CVA (cerebrovascular accident) 07/14/2016  . High cholesterol   . History of pericarditis   . Hyperlipidemia   . Hypotension   . Hypothyroidism   . IBS  (irritable bowel syndrome)   . Liver cancer ([place]) dx'd 06/2016   liver  . Lung cancer ([place])   . Neuroendocrine cancer ([place]) 08/23/2016  . Obesity   . Paroxysmal atrial fibrillation ([name])    chads2vasc score is 6,  she is felt to be a poor candidate for anticoagulation  . Pericarditis   . Personal history of arterial venous malformation (AVM)    right side of face  . Pulmonary embolism ([place])   . Seizure disorder ([place])   . Seizures ([place]) 2003   " smelling"- Gabapentin "no problem"  . Shortness of breath dyspnea    with exertion  . Sick sinus syndrome ([place])   . Sternum fx 10/27/2013  . Stroke [facility]) 5 years ago   Right side of face weak, slurred speach-   . Thyroid disease   . TIA (transient ischemic attack)   . UTI (lower urinary tract infection) 03/27/2016   "frequently"    Patient Active Problem List   Diagnosis Date Noted  . Acute blood loss anemia 11/07/2019  . Anemia in other chronic diseases classified elsewhere 11/07/2019  . Upper GI bleed 11/06/2019  . Anticoagulated   . Port-A-Cath in place 08/28/2019  . Encounter for antineoplastic chemotherapy 07/07/2019  . Skin  breakdown 06/04/2018  . Candida infection 06/04/2018  . Sick sinus syndrome ([place]) 05/22/2018  . Mild aortic stenosis 05/21/2018  . Primary malignant neoplasm of lung metastatic to other site [facility])   . Macrocytic anemia   . Thrombocytopenia ([place]) 03/23/2018  . Osteoarthritis of knee 01/10/2018  . Pulmonary embolism ([place]) 04/24/2017  . Neuroendocrine carcinoma metastatic to liver ([place]) 12/13/2016  . Liver metastases ([place]) 08/23/2016  . Late effect of cerebrovascular accident (CVA) 08/02/2016  . Meningioma ([place]) 07/14/2016  . Asthmatic bronchitis 10/23/2015  . GAD (generalized anxiety disorder) 09/30/2015  . Peripheral edema 09/30/2015  . Insomnia 09/30/2015  . Localization-related partial epilepsy with simple partial seizures ([place]) 06/27/2015  . Sinus bradycardia 01/03/2015  . Paroxysmal atrial  fibrillation ([place]) 01/03/2015  . Morbid obesity ([place]) 01/03/2015  . Solitary pulmonary nodule 06/02/2014  . Thyroid nodule 05/08/2014  . Depression   . Seizure disorder ([place])   . Hypothyroidism   . Cerebellar degeneration ([place])   . Irritable bowel syndrome 02/18/2008  . Hyperlipemia 09/10/2006  . Essential hypertension 09/10/2006  . GERD (gastroesophageal reflux disease) 09/10/2006    Past Surgical History:  Procedure Laterality Date  . APPENDECTOMY  86 years old  . COLONOSCOPY  2006, 2009  . COLONOSCOPY WITH PROPOFOL N/A 03/28/2016   Procedure: COLONOSCOPY WITH PROPOFOL;  Surgeon: Gatha Mayer, MD;  Location: [place];  Service: Endoscopy;  Laterality: N/A;  . CORONARY ANGIOGRAPHY N/A 05/22/2018   Procedure: CORONARY ANGIOGRAPHY (CATH LAB);  Surgeon: Lorretta Harp, MD;  Location: [place] CV LAB;  Service: Cardiovascular;  Laterality: N/A;  . cyst removed  35 years ago  . EP IMPLANTABLE DEVICE N/A 01/06/2015   Procedure: Loop Recorder Insertion;  Surgeon: Thompson Grayer, MD;  Location: [place] CV LAB;  Service: Cardiovascular;  Laterality: N/A;  . ESOPHAGOGASTRODUODENOSCOPY (EGD) WITH PROPOFOL N/A 11/07/2019   Procedure: ESOPHAGOGASTRODUODENOSCOPY (EGD) WITH PROPOFOL;  Surgeon: Yetta Flock, MD;  Location: [facility];  Service: Gastroenterology;  Laterality: N/A;  . EYE SURGERY Right    Cataract  . HEMOSTASIS CLIP PLACEMENT  11/07/2019   Procedure: HEMOSTASIS CLIP PLACEMENT;  Surgeon: Yetta Flock, MD;  [facility];  Service: Gastroenterology;;  . HEMOSTASIS CONTROL  11/07/2019   Procedure: HEMOSTASIS CONTROL;  Surgeon: Yetta Flock, MD;  [facility];  Service: Gastroenterology;;  . Chrystine Oiler SELECTIVE EACH ADDITIONAL VESSEL  11/28/2016  . IR ANGIOGRAM SELECTIVE EACH ADDITIONAL VESSEL  11/28/2016  . IR ANGIOGRAM SELECTIVE EACH ADDITIONAL VESSEL  11/28/2016  . IR ANGIOGRAM SELECTIVE EACH ADDITIONAL VESSEL  11/28/2016  . IR  ANGIOGRAM SELECTIVE EACH ADDITIONAL VESSEL  12/13/2016  . IR ANGIOGRAM SELECTIVE EACH ADDITIONAL VESSEL  12/13/2016  . IR ANGIOGRAM SELECTIVE EACH ADDITIONAL VESSEL  01/09/2017  . IR ANGIOGRAM VISCERAL SELECTIVE  11/28/2016  . IR ANGIOGRAM VISCERAL SELECTIVE  11/28/2016  . IR ANGIOGRAM VISCERAL SELECTIVE  12/13/2016  . IR ANGIOGRAM VISCERAL SELECTIVE  12/13/2016  . IR ANGIOGRAM VISCERAL SELECTIVE  01/09/2017  . IR EMBO ARTERIAL NOT HEMORR HEMANG INC GUIDE ROADMAPPING  11/28/2016  . IR EMBO TUMOR ORGAN ISCHEMIA INFARCT INC GUIDE ROADMAPPING  12/13/2016  . IR EMBO TUMOR ORGAN ISCHEMIA INFARCT INC GUIDE ROADMAPPING  01/09/2017  . IR IVC FILTER PLMT / S&I /IMG GUID/MOD SED  04/24/2017  . IR RADIOLOGIST EVAL & MGMT  11/06/2016  . IR RADIOLOGIST EVAL & MGMT  01/02/2017  . IR RADIOLOGIST EVAL & MGMT  02/05/2017  . IR RADIOLOGIST EVAL & MGMT  05/02/2017  .  IR RADIOLOGIST EVAL & MGMT  10/17/2017  . IR US GUIDE VASC ACCESS RIGHT  11/28/2016  . IR US GUIDE VASC ACCESS RIGHT  12/13/2016  . IR US GUIDE VASC ACCESS RIGHT  01/09/2017  . KNEE ARTHROSCOPY Right 11/14/2006  . KNEE ARTHROSCOPY Bilateral 5 and 6 years ago  . KNEE ARTHROSCOPY WITH LATERAL MENISECTOMY  07/03/2012   Procedure: KNEE ARTHROSCOPY WITH LATERAL MENISECTOMY;  Surgeon: Magnus Sinning, MD;  [facility];  Service: Orthopedics;  Laterality: Left;  with Partial Lateral Menisectomy and Medial Menisectomy. Shaving of medial and lateral femoral condyles. Shaving of patella. Removal of a loose body  . LOOP RECORDER REMOVAL N/A 05/22/2018   Procedure: LOOP RECORDER REMOVAL;  Surgeon: Thompson Grayer, MD;  Location: [place] [facility];  Service: Cardiovascular;  Laterality: N/A;  . PACEMAKER IMPLANT N/A 05/22/2018   MDT Azure XT DR MRI implanted by Dr Rayann Heman for sick sinus syndrome  . POLYPECTOMY  11/07/2019   Procedure: POLYPECTOMY;  Surgeon: Yetta Flock, MD;  [facility];  Service: Gastroenterology;;  . tibial and fibular internal fixation  Left   . TOTAL ABDOMINAL HYSTERECTOMY  86 years old  . UPPER GASTROINTESTINAL ENDOSCOPY  2009, 2013     OB History   No obstetric history on file.     Family History  Problem Relation Age of Onset  . Heart attack Father 17       fatal  . Peptic Ulcer Mother   . Coronary artery disease Brother   . Diabetes Brother   . Colon cancer Brother 62  . Colon polyps Brother   . Prostate cancer Brother   . Diabetes Brother   . Prostate cancer Son   . COPD Daughter     Social History   Tobacco Use  . Smoking status: Former Smoker    Packs/day: 0.25    Years: 10.00    Pack years: 2.50    Types: Cigarettes    Quit date: 07/31/1975    Years since quitting: 45.0  . Smokeless tobacco: Never Used  Vaping Use  . Vaping Use: Never used  Substance Use Topics  . Alcohol use: No  . Drug use: No    Home Medications Prior to Admission medications   Medication Sig Start Date End Date Taking? Authorizing Provider  ALPRAZolam Duanne Moron) 0.25 MG tablet TAKE 1 TABLET BY MOUTH AT BEDTIME AS NEEDED FOR ANXIETY 05/11/20   Curt Bears, MD  amLODipine (NORVASC) 10 MG tablet Take 1 tablet (10 mg total) by mouth daily. 07/08/20   [name], MD  carboxymethylcellul-glycerin (REFRESH OPTIVE) 0.5-0.9 % ophthalmic solution Place 1 drop into the left eye as needed for dry eyes. 07/19/20   Isla Pence, MD  escitalopram (LEXAPRO) 20 MG tablet TAKE 1 TABLET AT BEDTIME 06/15/20   [name], MD  FeFum-FePoly-FA-B Cmp-C-Biot (INTEGRA PLUS) CAPS Take 1 capsule by mouth every morning. 05/11/20   Curt Bears, MD  fluticasone [facility]) 50 MCG/ACT nasal spray USE 2 SPRAYS IN EACH NOSTRIL AT BEDTIME 06/06/20   [name], MD  furosemide (LASIX) 20 MG tablet TAKE 1 TABLET DAILY AS NEEDED FOR FLUID RETENTION Patient taking differently: Take 20 mg by mouth daily as needed for fluid. 10/13/19   [name], MD  gabapentin (NEURONTIN) 400 MG capsule 2 CAPSULES THREE TIMES A DAY 06/06/20   [name], MD  HYDROcodone-acetaminophen (NORCO/VICODIN) 5-325 MG tablet Take 1 tablet by mouth every 6 (six) hours as needed for moderate pain. 07/08/20  [name], MD  hydroxypropyl methylcellulose / hypromellose (ISOPTO TEARS / GONIOVISC) 2.5 % ophthalmic solution Place 1 drop into both eyes daily.     [provider]  lovastatin (MEVACOR) 40 MG tablet TAKE 1 TABLET AT BEDTIME Patient taking differently: Take 40 mg by mouth at bedtime. 10/13/19   [name], MD  Melatonin 3 MG TBDP Take 3-6 mg by mouth at bedtime as needed. Patient taking differently: Take 3-6 mg by mouth at bedtime as needed (sleep). 08/02/16   Eustaquio Maize, MD  ondansetron (ZOFRAN) 8 MG tablet TAKE 1 TABLET EVERY 8 HOURS AS NEEDED FOR NAUSEA OR VOMITING 06/06/20   [name], MD  OXcarbazepine (TRILEPTAL) 150 MG tablet TAKE 1 TABLET TWICE A DAY 06/29/20   [name], MD  pantoprazole (PROTONIX) 40 MG tablet Take 1 tablet (40 mg total) by mouth 2 (two) times daily. 12/31/19 01/30/20  [name], MD  predniSONE (DELTASONE) 10 MG tablet Take 5 daily for 3 days followed by 4,3,2 and 1 for 3 days each. 07/08/20   [name], MD  QUEtiapine (SEROQUEL) 300 MG tablet Take 1 tablet (300 mg total) by mouth at bedtime. 07/08/20   [name], MD  silver sulfADIAZINE (SILVADENE) 1 % cream Apply 1 application topically daily. 06/17/19   [name], MD  SYNTHROID 75 MCG tablet TAKE 1 TABLET DAILY BEFORE BREAKFAST 11/19/19   [name], MD  valACYclovir (VALTREX) 1000 MG tablet TAKE  (1)  TABLET  THREE TIMES DAILY. 07/01/20   [name], MD  XARELTO 20 MG TABS tablet TAKE 1 TABLET DAILY WITH SUPPER 07/04/20   [name], MD    Allergies    Levaquin [levofloxacin] and Lisinopril  Review of Systems   Review of Systems  Neurological: Positive for headaches.  All other systems reviewed and are negative.   Physical Exam Updated Vital Signs BP (!) 137/59   Pulse 77   Temp 97.8 F (36.6 C)  (Oral)   Resp 16   Ht 5' (1.524 m)   Wt 89.3 kg   SpO2 96%   BMI 38.45 kg/m   Physical Exam Vitals and nursing note reviewed.  Constitutional:      Appearance: She is well-developed.  HENT:     Head: Normocephalic and atraumatic.     Mouth/Throat:     Mouth: Mucous membranes are moist.  Cardiovascular:     Rate and Rhythm: Normal rate and regular rhythm.     Heart sounds: Normal heart sounds.  Pulmonary:     Effort: Pulmonary effort is normal.     Breath sounds: Normal breath sounds.  Abdominal:     General: Bowel sounds are normal.     Palpations: Abdomen is soft.  Musculoskeletal:        General: Normal range of motion.       Arms:     Cervical back: Normal range of motion and neck supple.  Skin:    General: Skin is warm.     Capillary Refill: Capillary refill takes less than 2 seconds.  Neurological:     Mental Status: She is alert. She is confused.     Comments: Left eyelid weakness (per husband new)  Speech is not clear (old per husband)  She knows her name.  When asked her birth date, she spells birthday.  Psychiatric:        Mood and Affect: Mood normal.        Behavior: Behavior normal.     ED Results /  Procedures / Treatments   Labs (all labs ordered are listed, but only abnormal results are displayed) Labs Reviewed  BASIC METABOLIC PANEL - Abnormal; Notable for the following components:      Result Value   Glucose, Bld 109 (*)    BUN 26 (*)    All other components within normal limits  CBC - Abnormal; Notable for the following components:   RDW 23.9 (*)    Platelets 98 (*)    All other components within normal limits  URINALYSIS, ROUTINE W REFLEX MICROSCOPIC - Abnormal; Notable for the following components:   APPearance HAZY (*)    Specific Gravity, Urine 1.033 (*)    Nitrite POSITIVE (*)    All other components within normal limits  RESP PANEL BY RT-PCR (FLU A&B, COVID) [id number]  PROTIME-INR    EKG EKG Interpretation  Date/Time:  Tuesday  July 19 2020 16:22:02 EST Ventricular Rate:  84 PR Interval:    QRS Duration: 87 QT Interval:  377 QTC Calculation: 446 R Axis:   28 Text Interpretation: Sinus rhythm Inferior infarct, age indeterminate No significant change since last tracing Confirmed by Isla Pence [phone]) on 07/19/2020 4:28:45 PM   Radiology CT Angio Head W or Wo Contrast  Result Date: 07/19/2020 CLINICAL DATA:  Headache. EXAM: CT ANGIOGRAPHY HEAD AND NECK TECHNIQUE: Multidetector CT imaging of the head and neck was performed using the standard protocol during bolus administration of intravenous contrast. Multiplanar CT image reconstructions and MIPs were obtained to evaluate the vascular anatomy. Carotid stenosis measurements (when applicable) are obtained utilizing NASCET criteria, using the distal internal carotid diameter as the denominator. CONTRAST:  59mL OMNIPAQUE IOHEXOL 350 MG/ML SOLN COMPARISON:  CT head without contrast 07/19/20. CTA of the head and neck 07/17/2016. MR head without and with contrast 07/14/2016 FINDINGS: CTA NECK FINDINGS Aortic arch: Atherosclerotic calcifications are present at the aortic arch great vessel origins without aneurysm or stenosis. Right carotid system: Proximal right common carotid artery is somewhat tortuous. Atherosclerotic changes are present at the bifurcation without significant stenosis. There is mild tortuosity of the cervical right ICA without significant stenosis. Left carotid system: Left common carotid artery demonstrates proximal mural calcification without significant stenosis. There is distal tortuosity proximal to the bifurcation. Atherosclerotic irregularity is present at the bifurcation and proximal left ICA without significant stenosis. Vertebral arteries: The left vertebral artery is slightly dominant to the right. Moderate stenosis is present at the origin of the right vertebral artery. Both vertebral arteries originate from the subclavian arteries. No additional  stenoses are present in the neck. Skeleton: Multilevel degenerative changes are again noted in the cervical spine with degenerative anterolisthesis at C4-5, stable. Multilevel facet disease is stable. No focal lytic or blastic lesions are present. Other neck: Soft tissues the neck are unremarkable. Upper chest: Previously noted right apical airspace disease has cleared. Right upper lobe pulmonary nodule is stable to decreased in size. Review of the MIP images confirms the above findings CTA HEAD FINDINGS Anterior circulation: Atherosclerotic calcifications are present at the ophthalmic segment of both internal carotid arteries without significant stenosis relative to the ICA termini. The A1 and M1 segments are normal. The left A1 segment is dominant. MCA bifurcations are intact. The ACA and MCA branch vessels are within normal limits. Posterior circulation: The left vertebral artery is the dominant vessel. There is moderate stenosis in the right V3 segment. Right PICA is not well seen. Basilar artery is normal. The right posterior cerebral artery is fed by a posterior communicating  artery and right P1 segment. The left posterior cerebral artery is of fetal type. PCA branch vessels are within normal limits bilaterally. Venous sinuses: The dural sinuses are patent. Straight sinus and deep cerebral veins are intact. Cortical veins are unremarkable. Anatomic variants: Fetal type left posterior cerebral artery. Prominent right posterior communicating artery. Review of the MIP images confirms the above findings IMPRESSION: 1. No emergent large vessel occlusion. 2. Moderate stenosis at the origin of the right vertebral artery. 3. Moderate stenosis of the right V3 segment, progressive from the prior study. 4. Fetal type left posterior cerebral artery. 5. Stable to decreased size of right upper lobe pulmonary nodule. 6. Aortic Atherosclerosis (ICD10-I70.0). Electronically Signed   By: San Morelle M.D.   On:  07/19/2020 18:30   DG Chest 2 View  Result Date: 07/19/2020 CLINICAL DATA:  Pain status post fall.  Left shoulder pain. EXAM: CHEST - 2 VIEW COMPARISON:  05/23/2018 FINDINGS: The heart size and mediastinal contours are within normal limits. There is no focal infiltrate. There is atelectasis at the lung bases. There is a nodular density projecting over the right lung apex which is not well appreciated on the patient's prior chest x-ray. Additional smaller nodular densities are noted there is a dual chamber left-sided pacemaker in place. Aortic calcifications are noted. IMPRESSION: 1. No acute cardiopulmonary process. 2. Multiple pulmonary nodules are again noted as were previously demonstrated on the patient's CT from February 05, 2020. Electronically Signed   By: Constance Holster M.D.   On: 07/19/2020 18:19   CT HEAD WO CONTRAST  Result Date: 07/19/2020 CLINICAL DATA:  86 year old female with headache. EXAM: CT HEAD WITHOUT CONTRAST TECHNIQUE: Contiguous axial images were obtained from the base of the skull through the vertex without intravenous contrast. COMPARISON:  Head CT dated 09/16/2019. FINDINGS: Evaluation of this exam is limited due to motion artifact. Brain: Mild age-related atrophy and moderate chronic microvascular ischemic changes. Areas of old infarct and encephalomalacia involving the cerebellar hemispheres. There is no acute intracranial hemorrhage. Calcified meningioma at the junction of the right tentorium and falx measuring approximately 14 x 9 mm similar to prior CT. No significant mass effect. No edema in the adjacent brain parenchyma. No midline shift. No extra-axial fluid collection. Vascular: No hyperdense vessel or unexpected calcification. Skull: Normal. Negative for fracture or focal lesion. Sinuses/Orbits: No acute finding. Other: None IMPRESSION: 1. No acute intracranial pathology. 2. Age-related atrophy and chronic microvascular ischemic changes. Areas of old infarct and  encephalomalacia involving the cerebellar hemispheres. 3. Stable calcified meningioma at the junction of the right tentorium and falx. Electronically Signed   By: Anner Crete M.D.   On: 07/19/2020 15:48   CT Angio Neck W and/or Wo Contrast  Result Date: 07/19/2020 CLINICAL DATA:  Headache. EXAM: CT ANGIOGRAPHY HEAD AND NECK TECHNIQUE: Multidetector CT imaging of the head and neck was performed using the standard protocol during bolus administration of intravenous contrast. Multiplanar CT image reconstructions and MIPs were obtained to evaluate the vascular anatomy. Carotid stenosis measurements (when applicable) are obtained utilizing NASCET criteria, using the distal internal carotid diameter as the denominator. CONTRAST:  108mL OMNIPAQUE IOHEXOL 350 MG/ML SOLN COMPARISON:  CT head without contrast 07/19/20. CTA of the head and neck 07/17/2016. MR head without and with contrast 07/14/2016 FINDINGS: CTA NECK FINDINGS Aortic arch: Atherosclerotic calcifications are present at the aortic arch great vessel origins without aneurysm or stenosis. Right carotid system: Proximal right common carotid artery is somewhat tortuous. Atherosclerotic changes are present at  the bifurcation without significant stenosis. There is mild tortuosity of the cervical right ICA without significant stenosis. Left carotid system: Left common carotid artery demonstrates proximal mural calcification without significant stenosis. There is distal tortuosity proximal to the bifurcation. Atherosclerotic irregularity is present at the bifurcation and proximal left ICA without significant stenosis. Vertebral arteries: The left vertebral artery is slightly dominant to the right. Moderate stenosis is present at the origin of the right vertebral artery. Both vertebral arteries originate from the subclavian arteries. No additional stenoses are present in the neck. Skeleton: Multilevel degenerative changes are again noted in the cervical spine  with degenerative anterolisthesis at C4-5, stable. Multilevel facet disease is stable. No focal lytic or blastic lesions are present. Other neck: Soft tissues the neck are unremarkable. Upper chest: Previously noted right apical airspace disease has cleared. Right upper lobe pulmonary nodule is stable to decreased in size. Review of the MIP images confirms the above findings CTA HEAD FINDINGS Anterior circulation: Atherosclerotic calcifications are present at the ophthalmic segment of both internal carotid arteries without significant stenosis relative to the ICA termini. The A1 and M1 segments are normal. The left A1 segment is dominant. MCA bifurcations are intact. The ACA and MCA branch vessels are within normal limits. Posterior circulation: The left vertebral artery is the dominant vessel. There is moderate stenosis in the right V3 segment. Right PICA is not well seen. Basilar artery is normal. The right posterior cerebral artery is fed by a posterior communicating artery and right P1 segment. The left posterior cerebral artery is of fetal type. PCA branch vessels are within normal limits bilaterally. Venous sinuses: The dural sinuses are patent. Straight sinus and deep cerebral veins are intact. Cortical veins are unremarkable. Anatomic variants: Fetal type left posterior cerebral artery. Prominent right posterior communicating artery. Review of the MIP images confirms the above findings IMPRESSION: 1. No emergent large vessel occlusion. 2. Moderate stenosis at the origin of the right vertebral artery. 3. Moderate stenosis of the right V3 segment, progressive from the prior study. 4. Fetal type left posterior cerebral artery. 5. Stable to decreased size of right upper lobe pulmonary nodule. 6. Aortic Atherosclerosis (ICD10-I70.0). Electronically Signed   By: San Morelle M.D.   On: 07/19/2020 18:30   DG Shoulder Left  Result Date: 07/19/2020 CLINICAL DATA:  Left shoulder pain after fall. EXAM:  LEFT SHOULDER - 2+ VIEW COMPARISON:  None. FINDINGS: No acute fracture or dislocation. Mild acromioclavicular and glenohumeral degenerative changes. Soft tissues are unremarkable. IMPRESSION: 1. No acute osseous abnormality. Electronically Signed   By: Titus Dubin M.D.   On: 07/19/2020 18:14    Procedures Procedures (including critical care time)  Medications Ordered in ED Medications  0.9 %  sodium chloride infusion (0 mLs Intravenous Stopped 07/19/20 2201)  iohexol (OMNIPAQUE) 350 MG/ML injection 100 mL (75 mLs Intravenous Contrast Given 07/19/20 1756)    ED Course  I have reviewed the triage vital signs and the nursing notes.  Pertinent labs & imaging results that were available during my care of the patient were reviewed by me and considered in my medical decision making (see chart for details).    MDM Rules/Calculators/A&P                          No evidence of LVO on CTA.  We are unable to get a MRI here as pt has a pacemaker.  It is MRI compatible, but there is no one here that  can turn it off.    Pt's husband said she is back to baseline.  She may have some complex migraines.  The left eyelid weakness is likely Bells Palsy as she has no other focal neurologic deficits.  Pt will need to f/u with neurology.  She sees Dr. Tomi Likens and is told to f/u with him.  Return if worse.     Final Clinical Impression(s) / ED Diagnoses Final diagnoses:  Acute nonintractable headache, unspecified headache type  Bell's palsy    Rx / DC Orders ED Discharge Orders         Ordered    carboxymethylcellul-glycerin (REFRESH OPTIVE) 0.5-0.9 % ophthalmic solution  As needed,   Status:  Discontinued        07/19/20 2150    carboxymethylcellul-glycerin (REFRESH OPTIVE) 0.5-0.9 % ophthalmic solution  As needed        07/19/20 2204           Isla Pence, MD 07/19/20 2211

## 2020-07-19 NOTE — ED Notes (Signed)
Patient transported to CT 

## 2020-07-20 ENCOUNTER — Encounter: Payer: Self-pay | Admitting: Family Medicine

## 2020-07-20 DIAGNOSIS — G450 Vertebro-basilar artery syndrome: Secondary | ICD-10-CM | POA: Insufficient documentation

## 2020-07-25 NOTE — Progress Notes (Signed)
NEUROLOGY FOLLOW UP OFFICE NOTE  [name] [id number]   Subjective:  [name] is an 86 year old right-handed Caucasian woman with cerebellar degeneration, metastatic intermediate grade neuroendocrine carcinoma, hypertension, paroxysmal atrial fibrillation, IBS, hypothyroidism and history of TIA and simple partial seizures who follows up for headache. She is accompanied by her husband who supplements history.  UPDATE: Current medication:Oxcarbazepine 150mg  BID; Gabapentin 800 mg/400 mg / 800 mg for seizure prophylaxis; Seroquel 25mg  at bedtime (hallucinations)  Due to polypharmacy, amitriptyline were discontinued back in March and she was maintained on gabapentin for treatment of trigeminal neuralgia, burning tongue syndrome and seizure prophylaxis.    Since last month, she has had intermittent right temporal headaches.  Initially it woke her up out of sleep and lasted an hour.  She went back to sleep and woke up again a couple of times.  She was seen in the ED the next day on 12/21.  CTA head and neck personally reviewed showed moderate stenosis at origin of right vertebral artery and of right V3 segment but no emergent large vessel stenosis, occlusion or aneurysm She was noted to have a left ptosis and was diagnosed with probable Bell's Palsy.  Last headache was on 12/23.    Na from October was 125 repeat from this month was 135.   HISTORY: She reports history of an AVM on the right side of her face, diagnosed many years ago and therefore cannot use blood thinners. However, prior MRA of the head and neck did not reveal any AVM.  She also reports pain from the left side of her jaw radiating up to the left temple since 2017. In 2018, she developed pain around her left eye which would hurt whenever she moved her eye. It eventually resolved. She then developed a severe aching pain at her left TMJ which radiates up into her temple which occur daily, lasting a few seconds  and occurring intermittent throughout the day. It may occur spontaneously but also is aggravated by exertion but not talking, chewing or brushing her teeth.  Repeat CT of head with and without contrast from 07/18/18 was personally reviewed and demonstrated known meningioma along right tentorium with interval growth compared to prior imaging from December 2017 (11 x 14 x 20 mm compared to 7 x 9 x 15 mm in 2017).  To follow up on meningioma, CT head with and without contrast was performed on 09/17/2019, which was personally reviewed and demonstrated stable meningioma size at 1 cm compared to 07/18/2018.  As per radiology report, prior measurements greater than 1 cm had included adjacent venous sinuses.  There appeared to have been slow growth since 2017, measuring about 8 mm on 07/14/2016.  Sed rate from 06/26/2017 was 34.  Past medications:  Amitriptyline  PAST MEDICAL HISTORY: Past Medical History:  Diagnosis Date   Acute respiratory failure with hypoxia ([place]) 10/23/2015   Allergic rhinitis    PT. DENIES   Anxiety    Aortic insufficiency    Echo 04/29/2018: EF 65-70, mild AS (mean 13), mod AI, Asc Aorta 42 mm (mildly dilated), mild LAE, PASP 41, pericardium normal in appearance.    Arthritis    NECK   Ataxia    Back pain 12/13/2016   Bradycardia    primarily nocturnal   Burning tongue syndrome 25 years   Cataract    Cerebellar degeneration ([name])    Chest pain 12/13/2016   Atypical chest pain   Chronic urinary tract infection    Complication of  anesthesia    low o2 sats, coded 30 years ago   CVA (cerebral infarction) 05/2003   Dehydration with hyponatremia 03/23/2018   Depression    DVT (deep venous thrombosis) ([place])    Encounter for antineoplastic chemotherapy 10/09/2016   Fatigue 01/03/2015   Gait disorder    Gastric polyps    GERD (gastroesophageal reflux disease)    Goals of care, counseling/discussion 10/09/2016   H/O: CVA (cerebrovascular accident)  07/14/2016   High cholesterol    History of pericarditis    Hyperlipidemia    Hypotension    Hypothyroidism    IBS (irritable bowel syndrome)    Liver cancer ([place]) dx'd 06/2016   liver   Lung cancer ([place])    Neuroendocrine cancer ([place]) 08/23/2016   Obesity    Paroxysmal atrial fibrillation ([name])    chads2vasc score is 6,  she is felt to be a poor candidate for anticoagulation   Pericarditis    Personal history of arterial venous malformation (AVM)    right side of face   Pulmonary embolism ([place])    Seizure disorder ([place])    Seizures ([place]) 2003   " smelling"- Gabapentin "no problem"   Shortness of breath dyspnea    with exertion   Sick sinus syndrome ([name])    Sternum fx 10/27/2013   Stroke ([place]) 5 years ago   Right side of face weak, slurred speach-    Thyroid disease    TIA (transient ischemic attack)    UTI (lower urinary tract infection) 03/27/2016   "frequently"    MEDICATIONS: Current Outpatient Medications on File Prior to Visit  Medication Sig Dispense Refill   ALPRAZolam (XANAX) 0.25 MG tablet TAKE 1 TABLET BY MOUTH AT BEDTIME AS NEEDED FOR ANXIETY 30 tablet 0   amLODipine (NORVASC) 10 MG tablet Take 1 tablet (10 mg total) by mouth daily. 90 tablet 1   carboxymethylcellul-glycerin (REFRESH OPTIVE) 0.5-0.9 % ophthalmic solution Place 1 drop into the left eye as needed for dry eyes. 15 mL 0   escitalopram (LEXAPRO) 20 MG tablet TAKE 1 TABLET AT BEDTIME 90 tablet 0   FeFum-FePoly-FA-B Cmp-C-Biot (INTEGRA PLUS) CAPS Take 1 capsule by mouth every morning. 30 capsule 2   fluticasone (FLONASE) 50 MCG/ACT nasal spray USE 2 SPRAYS IN EACH NOSTRIL AT BEDTIME 48 g 1   furosemide (LASIX) 20 MG tablet TAKE 1 TABLET DAILY AS NEEDED FOR FLUID RETENTION (Patient taking differently: Take 20 mg by mouth daily as needed for fluid.) 90 tablet 3   gabapentin (NEURONTIN) 400 MG capsule 2 CAPSULES THREE TIMES A DAY 540 capsule 1   HYDROcodone-acetaminophen  (NORCO/VICODIN) 5-325 MG tablet Take 1 tablet by mouth every 6 (six) hours as needed for moderate pain. 30 tablet 0   hydroxypropyl methylcellulose / hypromellose (ISOPTO TEARS / GONIOVISC) 2.5 % ophthalmic solution Place 1 drop into both eyes daily.      lovastatin (MEVACOR) 40 MG tablet TAKE 1 TABLET AT BEDTIME (Patient taking differently: Take 40 mg by mouth at bedtime.) 90 tablet 3   Melatonin 3 MG TBDP Take 3-6 mg by mouth at bedtime as needed. (Patient taking differently: Take 3-6 mg by mouth at bedtime as needed (sleep).) 60 tablet 1   ondansetron (ZOFRAN) 8 MG tablet TAKE 1 TABLET EVERY 8 HOURS AS NEEDED FOR NAUSEA OR VOMITING 90 tablet 3   OXcarbazepine (TRILEPTAL) 150 MG tablet TAKE 1 TABLET TWICE A DAY 180 tablet 0   pantoprazole (PROTONIX) 40 MG tablet Take 1 tablet (40 mg  total) by mouth 2 (two) times daily. 180 tablet 2   predniSONE (DELTASONE) 10 MG tablet Take 5 daily for 3 days followed by 4,3,2 and 1 for 3 days each. 45 tablet 0   QUEtiapine (SEROQUEL) 300 MG tablet Take 1 tablet (300 mg total) by mouth at bedtime. 30 tablet 2   silver sulfADIAZINE (SILVADENE) 1 % cream Apply 1 application topically daily. 400 g 0   SYNTHROID 75 MCG tablet TAKE 1 TABLET DAILY BEFORE BREAKFAST 90 tablet 3   valACYclovir (VALTREX) 1000 MG tablet TAKE  (1)  TABLET  THREE TIMES DAILY. 21 tablet 1   XARELTO 20 MG TABS tablet TAKE 1 TABLET DAILY WITH SUPPER 90 tablet 0   Current Facility-Administered Medications on File Prior to Visit  Medication Dose Route Frequency Provider Last Rate Last Admin   heparin lock flush 100 unit/mL  500 Units Intracatheter Daily PRN Heilingoetter, Cassandra L, PA-C        ALLERGIES: Allergies  Allergen Reactions   Levaquin [Levofloxacin] Shortness Of Breath   Lisinopril Cough    FAMILY HISTORY: Family History  Problem Relation Age of Onset   Heart attack Father 65       fatal   Peptic Ulcer Mother    Coronary artery disease Brother     Diabetes Brother    Colon cancer Brother 47   Colon polyps Brother    Prostate cancer Brother    Diabetes Brother    Prostate cancer Son    COPD Daughter     SOCIAL HISTORY: Social History   Socioeconomic History   Marital status: Married    Spouse name: Pheonix Wisby   Number of children: 6   Years of education: 10   Highest education level: 10th grade  Occupational History   Occupation: retired  Tobacco Use   Smoking status: Former Smoker    Packs/day: 0.25    Years: 10.00    Pack years: 2.50    Types: Cigarettes    Quit date: 07/31/1975    Years since quitting: 45.0   Smokeless tobacco: Never Used  Vaping Use   Vaping Use: Never used  Substance and Sexual Activity   Alcohol use: No   Drug use: No   Sexual activity: Not Currently  Other Topics Concern   Not on file  Social History Narrative   Lives with husband, does have stairs, does not use them. Pt completed 10th grade.   Right handed   Social Determinants of Health   Financial Resource Strain: Low Risk    Difficulty of Paying Living Expenses: Not hard at all  Food Insecurity: No Food Insecurity   Worried About Charity fundraiser in the Last Year: Never true   Ran Out of Food in the Last Year: Never true  Transportation Needs: No Transportation Needs   Lack of Transportation (Medical): No   Lack of Transportation (Non-Medical): No  Physical Activity: Inactive   Days of Exercise per Week: 0 days   Minutes of Exercise per Session: 0 min  Stress: No Stress Concern Present   Feeling of Stress : Only a little  Social Connections: Moderately Isolated   Frequency of Communication with Friends and Family: More than three times a week   Frequency of Social Gatherings with Friends and Family: More than three times a week   Attends Religious Services: Never   Marine scientist or Organizations: No   Attends Archivist Meetings: Never   Marital Status: Married  Intimate Partner Violence: Not At Risk   Fear of Current or Ex-Partner: No   Emotionally Abused: No   Physically Abused: No   Sexually Abused: No     Objective:  Blood pressure (!) 190/66, pulse 60, height 5\' 4"  (1.626 m), weight 198 lb (89.8 kg), SpO2 97 %. General: No acute distress.  Patient appears well-groomed.   Head/Face:  Normocephalic/atraumatic, mild tenderness to palpation of left temple and more significant tenderness to left lower jaw. Eyes:  Fundi examined but not visualized   Assessment/Plan:   1.  New onset left sided headache with left ptosis.  Ptosis may have been symptom of this headache syndrome but not likely Bell's palsy.  Denies history of migraines.  Consider temporal arteritis.  Not likely TMJ dysfunction as this had been worked up years ago.  This is likely a manifestation of her trigeminal neuralgia.  2.  Left-sided trigeminal neuralgia 3.  Cerebral meningioma, stable 4.  Major neurocognitive disorder. 5.  Symptomatic partial epilepsy, stable 6  Burning tongue syndrome  1.  Increase oxcarbazepine to 150mg  in AM and 300mg  QHS.  Check BMP (Na level) in 4 weeks. 2.  Continue gabapentin. 3.  Check sed rate 4.  Follow up in 6 months.  Metta Clines, DO  CC: [name], MD

## 2020-07-26 ENCOUNTER — Telehealth: Payer: Self-pay | Admitting: Internal Medicine

## 2020-07-26 ENCOUNTER — Other Ambulatory Visit: Payer: Medicare Other

## 2020-07-26 ENCOUNTER — Encounter: Payer: Self-pay | Admitting: Neurology

## 2020-07-26 ENCOUNTER — Other Ambulatory Visit: Payer: Self-pay

## 2020-07-26 ENCOUNTER — Ambulatory Visit (INDEPENDENT_AMBULATORY_CARE_PROVIDER_SITE_OTHER): Payer: Medicare Other | Admitting: Neurology

## 2020-07-26 VITALS — BP 190/66 | HR 60 | Ht 64.0 in | Wt 198.0 lb

## 2020-07-26 DIAGNOSIS — G500 Trigeminal neuralgia: Secondary | ICD-10-CM

## 2020-07-26 DIAGNOSIS — R519 Headache, unspecified: Secondary | ICD-10-CM | POA: Diagnosis not present

## 2020-07-26 DIAGNOSIS — G40109 Localization-related (focal) (partial) symptomatic epilepsy and epileptic syndromes with simple partial seizures, not intractable, without status epilepticus: Secondary | ICD-10-CM

## 2020-07-26 DIAGNOSIS — K146 Glossodynia: Secondary | ICD-10-CM

## 2020-07-26 DIAGNOSIS — F0390 Unspecified dementia without behavioral disturbance: Secondary | ICD-10-CM | POA: Diagnosis not present

## 2020-07-26 DIAGNOSIS — D320 Benign neoplasm of cerebral meninges: Secondary | ICD-10-CM | POA: Diagnosis not present

## 2020-07-26 MED ORDER — OXCARBAZEPINE 150 MG PO TABS: ORAL_TABLET | ORAL | 1 refills | Status: DC

## 2020-07-26 NOTE — Telephone Encounter (Signed)
Called pt per 12/28 sch msg - no answer . Left message for patient to call back to schedule appt.

## 2020-07-26 NOTE — Patient Instructions (Addendum)
1.  Increase oxcarbazepine to 1 tablet in morning and 2 tablets at bedtime.  Check basic metabolic panel in four weeks. 2.  Check sed rate 3.  Follow up 6 months but contact me sooner if medication adjustments are required.

## 2020-07-27 ENCOUNTER — Telehealth: Payer: Self-pay

## 2020-07-27 LAB — SEDIMENTATION RATE: Sed Rate: 25 mm/hr (ref 0–30)

## 2020-07-27 LAB — BASIC METABOLIC PANEL
BUN: 25 mg/dL — ABNORMAL HIGH (ref 6–23)
CO2: 28 mEq/L (ref 19–32)
Calcium: 9.4 mg/dL (ref 8.4–10.5)
Chloride: 103 mEq/L (ref 96–112)
Creatinine, Ser: 0.88 mg/dL (ref 0.40–1.20)
GFR: 60.38 mL/min (ref >60.00)
Glucose, Bld: 114 mg/dL — ABNORMAL HIGH (ref 70–99)
Potassium: 5 mEq/L (ref 3.5–5.1)
Sodium: 138 mEq/L (ref 135–145)

## 2020-07-27 NOTE — Telephone Encounter (Signed)
Spoke with pt husband informed him that [name] Sed rate was normal

## 2020-07-27 NOTE — Telephone Encounter (Signed)
-----   Message from Pieter Partridge, DO sent at 07/27/2020 11:32 AM EST ----- Sed rate normal

## 2020-08-09 ENCOUNTER — Inpatient Hospital Stay: Payer: Medicare Other | Attending: Internal Medicine

## 2020-08-09 ENCOUNTER — Ambulatory Visit (HOSPITAL_COMMUNITY)
Admission: RE | Admit: 2020-08-09 | Discharge: 2020-08-09 | Disposition: A | Discharge status: Home / Self Care | Payer: Medicare Other | Source: Ambulatory Visit | Attending: Internal Medicine | Admitting: Internal Medicine

## 2020-08-09 ENCOUNTER — Encounter (HOSPITAL_COMMUNITY): Payer: Self-pay

## 2020-08-09 ENCOUNTER — Other Ambulatory Visit: Payer: Self-pay

## 2020-08-09 DIAGNOSIS — Z9221 Personal history of antineoplastic chemotherapy: Secondary | ICD-10-CM | POA: Insufficient documentation

## 2020-08-09 DIAGNOSIS — D509 Iron deficiency anemia, unspecified: Secondary | ICD-10-CM | POA: Insufficient documentation

## 2020-08-09 DIAGNOSIS — C3490 Malignant neoplasm of unspecified part of unspecified bronchus or lung: Secondary | ICD-10-CM | POA: Insufficient documentation

## 2020-08-09 DIAGNOSIS — C7B02 Secondary carcinoid tumors of liver: Secondary | ICD-10-CM | POA: Insufficient documentation

## 2020-08-09 DIAGNOSIS — C7A090 Malignant carcinoid tumor of the bronchus and lung: Secondary | ICD-10-CM | POA: Insufficient documentation

## 2020-08-09 DIAGNOSIS — I10 Essential (primary) hypertension: Secondary | ICD-10-CM | POA: Insufficient documentation

## 2020-08-09 DIAGNOSIS — Z79899 Other long term (current) drug therapy: Secondary | ICD-10-CM | POA: Insufficient documentation

## 2020-08-09 DIAGNOSIS — C787 Secondary malignant neoplasm of liver and intrahepatic bile duct: Secondary | ICD-10-CM | POA: Diagnosis not present

## 2020-08-09 LAB — CBC WITH DIFFERENTIAL (CANCER CENTER ONLY)
Abs Immature Granulocytes: 0.05 10*3/uL (ref 0.00–0.07)
Basophils Absolute: 0 10*3/uL (ref 0.0–0.1)
Basophils Relative: 1 %
Eosinophils Absolute: 0.1 10*3/uL (ref 0.0–0.5)
Eosinophils Relative: 3 %
HCT: 38.4 % (ref 36.0–46.0)
Hemoglobin: 12.5 g/dL (ref 12.0–15.0)
Immature Granulocytes: 1 %
Lymphocytes Relative: 18 %
Lymphs Abs: 0.8 10*3/uL (ref 0.7–4.0)
MCH: 31.4 pg (ref 26.0–34.0)
MCHC: 32.6 g/dL (ref 30.0–36.0)
MCV: 96.5 fL (ref 80.0–100.0)
Monocytes Absolute: 0.6 10*3/uL (ref 0.1–1.0)
Monocytes Relative: 15 %
Neutro Abs: 2.6 10*3/uL (ref 1.7–7.7)
Neutrophils Relative %: 62 %
Platelet Count: 108 10*3/uL — ABNORMAL LOW (ref 150–400)
RBC: 3.98 MIL/uL (ref 3.87–5.11)
RDW: 18.8 % — ABNORMAL HIGH (ref 11.5–15.5)
WBC Count: 4.2 10*3/uL (ref 4.0–10.5)
nRBC: 0 % (ref 0.0–0.2)

## 2020-08-09 LAB — CMP (CANCER CENTER ONLY)
ALT: 15 U/L (ref 0–44)
AST: 19 U/L (ref 15–41)
Albumin: 3.3 g/dL — ABNORMAL LOW (ref 3.5–5.0)
Alkaline Phosphatase: 93 U/L (ref 38–126)
Anion gap: 6 (ref 5–15)
BUN: 12 mg/dL (ref 8–23)
CO2: 27 mmol/L (ref 22–32)
Calcium: 10.2 mg/dL (ref 8.9–10.3)
Chloride: 102 mmol/L (ref 98–111)
Creatinine: 0.81 mg/dL (ref 0.44–1.00)
GFR, Estimated: >60 mL/min (ref >60)
Glucose, Bld: 100 mg/dL — ABNORMAL HIGH (ref 70–99)
Potassium: 5.2 mmol/L — ABNORMAL HIGH (ref 3.5–5.1)
Sodium: 135 mmol/L (ref 135–145)
Total Bilirubin: 0.4 mg/dL (ref 0.3–1.2)
Total Protein: 6.6 g/dL (ref 6.5–8.1)

## 2020-08-09 MED ORDER — IOHEXOL 300 MG/ML  SOLN
100.0000 mL | Freq: Once | INTRAMUSCULAR | Status: AC | PRN
  Administered 2020-08-09: 100 mL via INTRAVENOUS

## 2020-08-11 ENCOUNTER — Inpatient Hospital Stay (HOSPITAL_BASED_OUTPATIENT_CLINIC_OR_DEPARTMENT_OTHER): Payer: Medicare Other | Admitting: Internal Medicine

## 2020-08-11 ENCOUNTER — Other Ambulatory Visit: Payer: Self-pay

## 2020-08-11 VITALS — BP 143/54 | HR 61 | Temp 97.3°F | Resp 16 | Ht 64.0 in | Wt 202.2 lb

## 2020-08-11 DIAGNOSIS — C7B02 Secondary carcinoid tumors of liver: Secondary | ICD-10-CM | POA: Diagnosis not present

## 2020-08-11 DIAGNOSIS — C7B8 Other secondary neuroendocrine tumors: Secondary | ICD-10-CM | POA: Diagnosis not present

## 2020-08-11 DIAGNOSIS — Z79899 Other long term (current) drug therapy: Secondary | ICD-10-CM | POA: Diagnosis not present

## 2020-08-11 DIAGNOSIS — Z5111 Encounter for antineoplastic chemotherapy: Secondary | ICD-10-CM | POA: Diagnosis not present

## 2020-08-11 DIAGNOSIS — D509 Iron deficiency anemia, unspecified: Secondary | ICD-10-CM | POA: Diagnosis not present

## 2020-08-11 DIAGNOSIS — Z9221 Personal history of antineoplastic chemotherapy: Secondary | ICD-10-CM | POA: Diagnosis not present

## 2020-08-11 DIAGNOSIS — C7A090 Malignant carcinoid tumor of the bronchus and lung: Secondary | ICD-10-CM | POA: Diagnosis not present

## 2020-08-11 DIAGNOSIS — I10 Essential (primary) hypertension: Secondary | ICD-10-CM | POA: Insufficient documentation

## 2020-08-11 DIAGNOSIS — C3490 Malignant neoplasm of unspecified part of unspecified bronchus or lung: Secondary | ICD-10-CM | POA: Diagnosis not present

## 2020-08-11 DIAGNOSIS — D696 Thrombocytopenia, unspecified: Secondary | ICD-10-CM | POA: Diagnosis not present

## 2020-08-11 DIAGNOSIS — C7A8 Other malignant neuroendocrine tumors: Secondary | ICD-10-CM

## 2020-08-11 DIAGNOSIS — D638 Anemia in other chronic diseases classified elsewhere: Secondary | ICD-10-CM

## 2020-08-11 MED ORDER — INTEGRA PLUS PO CAPS: 1.0000 | ORAL_CAPSULE | Freq: Every morning | ORAL | 2 refills | Status: DC

## 2020-08-11 NOTE — Progress Notes (Signed)
[facility] [phone]   Fax:([phone]) [place], [place] [address]  DIAGNOSIS:  1) Metastatic intermediate. Neuroendocrine tumor of lung primary diagnosed in January 2018 and presented with small bilateral pulmonary nodules in addition to multiple liver metastasis. 2) right lower lobe pulmonary embolism diagnosed incidentally on CT scan of the chest on 04/23/2017  PRIOR THERAPY:  1) Status post radio embolization with Y 90 to the liver lesions by interventional radiology. 2) status post IVC filter placement by interventional radiology on 04/24/2017  CURRENT THERAPY: Xeloda 750 MG/M2 twice a day days 1-14 and Temodar 150 MG/M2 days 10-14 every 4 weeks. Status post 43 cycles.  Her treatment will be on hold for the next few months because of intolerance.  INTERVAL HISTORY: [name] 87 y.o. female returns to the clinic today for follow-up visit accompanied by her husband.  The patient is feeling fine today with no concerning complaints except for the baseline fatigue and swelling of the lower extremities.  She is currently on Lasix by her primary care physician on as-needed basis for the swelling.  The patient denied having any current chest pain, cough or hemoptysis.  She denied having any fever or chills.  She has no nausea, vomiting, diarrhea or constipation.  She denied having any headache or visual changes.  She is currently off treatment and she had repeat CT scan of the chest, abdomen pelvis for restaging of her disease.  MEDICAL HISTORY: Past Medical History:  Diagnosis Date  . Acute respiratory failure with hypoxia ([place]) 10/23/2015  . Allergic rhinitis    PT. DENIES  . Anxiety   . Aortic insufficiency    Echo 04/29/2018: EF 65-70, mild AS (mean 13), mod AI, Asc Aorta 42 mm (mildly dilated), mild LAE, PASP 41, pericardium normal in appearance.   . Arthritis    NECK  . Ataxia   . Back pain  12/13/2016  . Bradycardia    primarily nocturnal  . Burning tongue syndrome 25 years  . Cataract   . Cerebellar degeneration ([place])   . Chest pain 12/13/2016   Atypical chest pain  . Chronic urinary tract infection   . Complication of anesthesia    low o2 sats, coded 30 years ago  . CVA (cerebral infarction) 05/2003  . Dehydration with hyponatremia 03/23/2018  . Depression   . DVT (deep venous thrombosis) ([place])   . Encounter for antineoplastic chemotherapy 10/09/2016  . Fatigue 01/03/2015  . Gait disorder   . Gastric polyps   . GERD (gastroesophageal reflux disease)   . Goals of care, counseling/discussion 10/09/2016  . H/O: CVA (cerebrovascular accident) 07/14/2016  . High cholesterol   . History of pericarditis   . Hyperlipidemia   . Hypotension   . Hypothyroidism   . IBS (irritable bowel syndrome)   . Liver cancer ([place]) dx'd 06/2016   liver  . Lung cancer ([place])   . Neuroendocrine cancer ([place]) 08/23/2016  . Obesity   . Paroxysmal atrial fibrillation ([name])    chads2vasc score is 6,  she is felt to be a poor candidate for anticoagulation  . Pericarditis   . Personal history of arterial venous malformation (AVM)    right side of face  . Pulmonary embolism ([place])   . Seizure disorder ([place])   . Seizures ([facility]) 2003   " smelling"- Gabapentin "no problem"  . Shortness of breath dyspnea    with exertion  .  Sick sinus syndrome ([place])   . Sternum fx 10/27/2013  . Stroke [facility]) 5 years ago   Right side of face weak, slurred speach-   . Thyroid disease   . TIA (transient ischemic attack)   . UTI (lower urinary tract infection) 03/27/2016   "frequently"    ALLERGIES:  is allergic to levaquin [levofloxacin] and lisinopril.  MEDICATIONS:  Current Outpatient Medications  Medication Sig Dispense Refill  . ALPRAZolam (XANAX) 0.25 MG tablet TAKE 1 TABLET BY MOUTH AT BEDTIME AS NEEDED FOR ANXIETY 30 tablet 0  . amLODipine (NORVASC) 10 MG tablet Take 1 tablet (10 mg total) by mouth daily.  90 tablet 1  . escitalopram (LEXAPRO) 20 MG tablet TAKE 1 TABLET AT BEDTIME 90 tablet 0  . FeFum-FePoly-FA-B Cmp-C-Biot (INTEGRA PLUS) CAPS Take 1 capsule by mouth every morning. 30 capsule 2  . fluticasone (FLONASE) 50 MCG/ACT nasal spray USE 2 SPRAYS IN EACH NOSTRIL AT BEDTIME 48 g 1  . furosemide (LASIX) 20 MG tablet TAKE 1 TABLET DAILY AS NEEDED FOR FLUID RETENTION (Patient taking differently: Take 20 mg by mouth daily as needed for fluid.) 90 tablet 3  . gabapentin (NEURONTIN) 400 MG capsule 2 CAPSULES THREE TIMES A DAY 540 capsule 1  . HYDROcodone-acetaminophen (NORCO/VICODIN) 5-325 MG tablet Take 1 tablet by mouth every 6 (six) hours as needed for moderate pain. 30 tablet 0  . hydroxypropyl methylcellulose / hypromellose (ISOPTO TEARS / GONIOVISC) 2.5 % ophthalmic solution Place 1 drop into both eyes daily.     Marland Kitchen lovastatin (MEVACOR) 40 MG tablet TAKE 1 TABLET AT BEDTIME (Patient taking differently: Take 40 mg by mouth at bedtime.) 90 tablet 3  . Melatonin 3 MG TBDP Take 3-6 mg by mouth at bedtime as needed. (Patient taking differently: Take 3-6 mg by mouth at bedtime as needed (sleep).) 60 tablet 1  . ondansetron (ZOFRAN) 8 MG tablet TAKE 1 TABLET EVERY 8 HOURS AS NEEDED FOR NAUSEA OR VOMITING 90 tablet 3  . OXcarbazepine (TRILEPTAL) 150 MG tablet Take 1 tablet in morning and 2 tablets at bedtime. 270 tablet 1  . pantoprazole (PROTONIX) 40 MG tablet Take 1 tablet (40 mg total) by mouth 2 (two) times daily. 180 tablet 2  . QUEtiapine (SEROQUEL) 300 MG tablet Take 1 tablet (300 mg total) by mouth at bedtime. 30 tablet 2  . silver sulfADIAZINE (SILVADENE) 1 % cream Apply 1 application topically daily. 400 g 0  . SYNTHROID 75 MCG tablet TAKE 1 TABLET DAILY BEFORE BREAKFAST 90 tablet 3  . XARELTO 20 MG TABS tablet TAKE 1 TABLET DAILY WITH SUPPER 90 tablet 0   No current facility-administered medications for this visit.   Facility-Administered Medications Ordered in Other Visits  Medication  Dose Route Frequency Provider Last Rate Last Admin  . heparin lock flush 100 unit/mL  500 Units Intracatheter Daily PRN Heilingoetter, Cassandra L, PA-C        SURGICAL HISTORY:  Past Surgical History:  Procedure Laterality Date  . APPENDECTOMY  87 years old  . COLONOSCOPY  2006, 2009  . COLONOSCOPY WITH PROPOFOL N/A 03/28/2016   Procedure: COLONOSCOPY WITH PROPOFOL;  Surgeon: Gatha Mayer, MD;  Location: [facility];  Service: Endoscopy;  Laterality: N/A;  . CORONARY ANGIOGRAPHY N/A 05/22/2018   Procedure: CORONARY ANGIOGRAPHY (CATH LAB);  Surgeon: Lorretta Harp, MD;  Location: [facility];  Service: Cardiovascular;  Laterality: N/A;  . cyst removed  35 years ago  . EP IMPLANTABLE DEVICE N/A 01/06/2015  Procedure: Loop Recorder Insertion;  Surgeon: Thompson Grayer, MD;  Location: [facility];  Service: Cardiovascular;  Laterality: N/A;  . ESOPHAGOGASTRODUODENOSCOPY (EGD) WITH PROPOFOL N/A 11/07/2019   Procedure: ESOPHAGOGASTRODUODENOSCOPY (EGD) WITH PROPOFOL;  Surgeon: Yetta Flock, MD;  Location: [facility];  Service: Gastroenterology;  Laterality: N/A;  . EYE SURGERY Right    Cataract  . HEMOSTASIS CLIP PLACEMENT  11/07/2019   Procedure: HEMOSTASIS CLIP PLACEMENT;  Surgeon: Yetta Flock, MD;  Location: [facility];  Service: Gastroenterology;;  . HEMOSTASIS CONTROL  11/07/2019   Procedure: HEMOSTASIS CONTROL;  Surgeon: Yetta Flock, MD;  Location: [facility];  Service: Gastroenterology;;  . Chrystine Oiler SELECTIVE EACH ADDITIONAL VESSEL  11/28/2016  . IR ANGIOGRAM SELECTIVE EACH ADDITIONAL VESSEL  11/28/2016  . IR ANGIOGRAM SELECTIVE EACH ADDITIONAL VESSEL  11/28/2016  . IR ANGIOGRAM SELECTIVE EACH ADDITIONAL VESSEL  11/28/2016  . IR ANGIOGRAM SELECTIVE EACH ADDITIONAL VESSEL  12/13/2016  . IR ANGIOGRAM SELECTIVE EACH ADDITIONAL VESSEL  12/13/2016  . IR ANGIOGRAM SELECTIVE EACH ADDITIONAL VESSEL  01/09/2017  . IR ANGIOGRAM VISCERAL SELECTIVE  11/28/2016   . IR ANGIOGRAM VISCERAL SELECTIVE  11/28/2016  . IR ANGIOGRAM VISCERAL SELECTIVE  12/13/2016  . IR ANGIOGRAM VISCERAL SELECTIVE  12/13/2016  . IR ANGIOGRAM VISCERAL SELECTIVE  01/09/2017  . IR EMBO ARTERIAL NOT HEMORR HEMANG INC GUIDE ROADMAPPING  11/28/2016  . IR EMBO TUMOR ORGAN ISCHEMIA INFARCT INC GUIDE ROADMAPPING  12/13/2016  . IR EMBO TUMOR ORGAN ISCHEMIA INFARCT INC GUIDE ROADMAPPING  01/09/2017  . IR IVC FILTER PLMT / S&I /IMG GUID/MOD SED  04/24/2017  . IR RADIOLOGIST EVAL & MGMT  11/06/2016  . IR RADIOLOGIST EVAL & MGMT  01/02/2017  . IR RADIOLOGIST EVAL & MGMT  02/05/2017  . IR RADIOLOGIST EVAL & MGMT  05/02/2017  . IR RADIOLOGIST EVAL & MGMT  10/17/2017  . IR US GUIDE VASC ACCESS RIGHT  11/28/2016  . IR US GUIDE VASC ACCESS RIGHT  12/13/2016  . IR US GUIDE VASC ACCESS RIGHT  01/09/2017  . KNEE ARTHROSCOPY Right 11/14/2006  . KNEE ARTHROSCOPY Bilateral 5 and 6 years ago  . KNEE ARTHROSCOPY WITH LATERAL MENISECTOMY  07/03/2012   Procedure: KNEE ARTHROSCOPY WITH LATERAL MENISECTOMY;  Surgeon: Magnus Sinning, MD;  Location: [facility];  Service: Orthopedics;  Laterality: Left;  with Partial Lateral Menisectomy and Medial Menisectomy. Shaving of medial and lateral femoral condyles. Shaving of patella. Removal of a loose body  . LOOP RECORDER REMOVAL N/A 05/22/2018   Procedure: LOOP RECORDER REMOVAL;  Surgeon: Thompson Grayer, MD;  Location: [facility] [place] [facility];  Service: Cardiovascular;  Laterality: N/A;  . PACEMAKER IMPLANT N/A 05/22/2018   MDT Azure XT DR MRI implanted by Dr Rayann Heman for sick sinus syndrome  . POLYPECTOMY  11/07/2019   Procedure: POLYPECTOMY;  Surgeon: Yetta Flock, MD;  Location: [facility];  Service: Gastroenterology;;  . tibial and fibular internal fixation Left   . TOTAL ABDOMINAL HYSTERECTOMY  87 years old  . UPPER GASTROINTESTINAL ENDOSCOPY  2009, 2013    REVIEW OF SYSTEMS:  Constitutional: positive for fatigue Eyes: negative Ears, nose, mouth, throat, and face:  negative Respiratory: positive for dyspnea on exertion Cardiovascular: negative Gastrointestinal: negative Genitourinary:negative Integument/breast: negative Hematologic/lymphatic: negative Musculoskeletal:positive for muscle weakness Neurological: negative Behavioral/Psych: negative Endocrine: negative Allergic/Immunologic: negative   PHYSICAL EXAMINATION: General appearance: alert, cooperative, fatigued and no distress Head: Normocephalic, without obvious abnormality, atraumatic Neck: no adenopathy, no JVD, supple, symmetrical, trachea midline and thyroid not enlarged, symmetric,  no tenderness/mass/nodules Lymph nodes: Cervical, supraclavicular, and axillary nodes normal. Resp: clear to auscultation bilaterally Back: symmetric, no curvature. ROM normal. No CVA tenderness. Cardio: regular rate and rhythm, S1, S2 normal, no murmur, click, rub or gallop GI: soft, non-tender; bowel sounds normal; no masses,  no organomegaly Extremities: edema 1+ edema bilateral Neurologic: Alert and oriented X 3, normal strength and tone. Normal symmetric reflexes. Normal coordination and gait  ECOG PERFORMANCE STATUS: 2 - Symptomatic, <50% confined to bed  Blood pressure (!) 143/54, pulse 61, temperature (!) 97.3 F (36.3 C), temperature source Tympanic, resp. rate 16, height 5\' 4"  (1.626 m), weight 202 lb 3.2 oz (91.7 kg), SpO2 100 %.  LABORATORY DATA: Lab Results  Component Value Date   WBC 4.2 08/09/2020   HGB 12.5 08/09/2020   HCT 38.4 08/09/2020   MCV 96.5 08/09/2020   PLT 108 (L) 08/09/2020      Chemistry      Component Value Date/Time   NA 135 08/09/2020 1147   NA 138 11/18/2019 1000   NA 135 (L) 07/10/2017 1142   K 5.2 (H) 08/09/2020 1147   K 4.6 07/10/2017 1142   CL 102 08/09/2020 1147   CO2 27 08/09/2020 1147   CO2 25 07/10/2017 1142   BUN 12 08/09/2020 1147   BUN 11 11/18/2019 1000   BUN 11.4 07/10/2017 1142   CREATININE 0.81 08/09/2020 1147   CREATININE 1.0 07/10/2017  1142      Component Value Date/Time   CALCIUM 10.2 08/09/2020 1147   CALCIUM 9.9 07/10/2017 1142   ALKPHOS 93 08/09/2020 1147   ALKPHOS 93 07/10/2017 1142   AST 19 08/09/2020 1147   AST 43 (H) 07/10/2017 1142   ALT 15 08/09/2020 1147   ALT 28 07/10/2017 1142   BILITOT 0.4 08/09/2020 1147   BILITOT 0.56 07/10/2017 1142       RADIOGRAPHIC STUDIES: CT Angio Head W or Wo Contrast  Result Date: 07/19/2020 CLINICAL DATA:  Headache. EXAM: CT ANGIOGRAPHY HEAD AND NECK TECHNIQUE: Multidetector CT imaging of the head and neck was performed using the standard protocol during bolus administration of intravenous contrast. Multiplanar CT image reconstructions and MIPs were obtained to evaluate the vascular anatomy. Carotid stenosis measurements (when applicable) are obtained utilizing NASCET criteria, using the distal internal carotid diameter as the denominator. CONTRAST:  62mL OMNIPAQUE IOHEXOL 350 MG/ML SOLN COMPARISON:  CT head without contrast 07/19/20. CTA of the head and neck 07/17/2016. MR head without and with contrast 07/14/2016 FINDINGS: CTA NECK FINDINGS Aortic arch: Atherosclerotic calcifications are present at the aortic arch great vessel origins without aneurysm or stenosis. Right carotid system: Proximal right common carotid artery is somewhat tortuous. Atherosclerotic changes are present at the bifurcation without significant stenosis. There is mild tortuosity of the cervical right ICA without significant stenosis. Left carotid system: Left common carotid artery demonstrates proximal mural calcification without significant stenosis. There is distal tortuosity proximal to the bifurcation. Atherosclerotic irregularity is present at the bifurcation and proximal left ICA without significant stenosis. Vertebral arteries: The left vertebral artery is slightly dominant to the right. Moderate stenosis is present at the origin of the right vertebral artery. Both vertebral arteries originate from the  subclavian arteries. No additional stenoses are present in the neck. Skeleton: Multilevel degenerative changes are again noted in the cervical spine with degenerative anterolisthesis at C4-5, stable. Multilevel facet disease is stable. No focal lytic or blastic lesions are present. Other neck: Soft tissues the neck are unremarkable. Upper chest: Previously noted right  apical airspace disease has cleared. Right upper lobe pulmonary nodule is stable to decreased in size. Review of the MIP images confirms the above findings CTA HEAD FINDINGS Anterior circulation: Atherosclerotic calcifications are present at the ophthalmic segment of both internal carotid arteries without significant stenosis relative to the ICA termini. The A1 and M1 segments are normal. The left A1 segment is dominant. MCA bifurcations are intact. The ACA and MCA branch vessels are within normal limits. Posterior circulation: The left vertebral artery is the dominant vessel. There is moderate stenosis in the right V3 segment. Right PICA is not well seen. Basilar artery is normal. The right posterior cerebral artery is fed by a posterior communicating artery and right P1 segment. The left posterior cerebral artery is of fetal type. PCA branch vessels are within normal limits bilaterally. Venous sinuses: The dural sinuses are patent. Straight sinus and deep cerebral veins are intact. Cortical veins are unremarkable. Anatomic variants: Fetal type left posterior cerebral artery. Prominent right posterior communicating artery. Review of the MIP images confirms the above findings IMPRESSION: 1. No emergent large vessel occlusion. 2. Moderate stenosis at the origin of the right vertebral artery. 3. Moderate stenosis of the right V3 segment, progressive from the prior study. 4. Fetal type left posterior cerebral artery. 5. Stable to decreased size of right upper lobe pulmonary nodule. 6. Aortic Atherosclerosis (ICD10-I70.0). Electronically Signed   By:  San Morelle M.D.   On: 07/19/2020 18:30   DG Chest 2 View  Result Date: 07/19/2020 CLINICAL DATA:  Pain status post fall.  Left shoulder pain. EXAM: CHEST - 2 VIEW COMPARISON:  05/23/2018 FINDINGS: The heart size and mediastinal contours are within normal limits. There is no focal infiltrate. There is atelectasis at the lung bases. There is a nodular density projecting over the right lung apex which is not well appreciated on the patient's prior chest x-ray. Additional smaller nodular densities are noted there is a dual chamber left-sided pacemaker in place. Aortic calcifications are noted. IMPRESSION: 1. No acute cardiopulmonary process. 2. Multiple pulmonary nodules are again noted as were previously demonstrated on the patient's CT from February 05, 2020. Electronically Signed   By: Constance Holster M.D.   On: 07/19/2020 18:19   CT HEAD WO CONTRAST  Result Date: 07/19/2020 CLINICAL DATA:  87 year old female with headache. EXAM: CT HEAD WITHOUT CONTRAST TECHNIQUE: Contiguous axial images were obtained from the base of the skull through the vertex without intravenous contrast. COMPARISON:  Head CT dated 09/16/2019. FINDINGS: Evaluation of this exam is limited due to motion artifact. Brain: Mild age-related atrophy and moderate chronic microvascular ischemic changes. Areas of old infarct and encephalomalacia involving the cerebellar hemispheres. There is no acute intracranial hemorrhage. Calcified meningioma at the junction of the right tentorium and falx measuring approximately 14 x 9 mm similar to prior CT. No significant mass effect. No edema in the adjacent brain parenchyma. No midline shift. No extra-axial fluid collection. Vascular: No hyperdense vessel or unexpected calcification. Skull: Normal. Negative for fracture or focal lesion. Sinuses/Orbits: No acute finding. Other: None IMPRESSION: 1. No acute intracranial pathology. 2. Age-related atrophy and chronic microvascular ischemic changes.  Areas of old infarct and encephalomalacia involving the cerebellar hemispheres. 3. Stable calcified meningioma at the junction of the right tentorium and falx. Electronically Signed   By: Anner Crete M.D.   On: 07/19/2020 15:48   CT Angio Neck W and/or Wo Contrast  Result Date: 07/19/2020 CLINICAL DATA:  Headache. EXAM: CT ANGIOGRAPHY HEAD AND NECK TECHNIQUE:  Multidetector CT imaging of the head and neck was performed using the standard protocol during bolus administration of intravenous contrast. Multiplanar CT image reconstructions and MIPs were obtained to evaluate the vascular anatomy. Carotid stenosis measurements (when applicable) are obtained utilizing NASCET criteria, using the distal internal carotid diameter as the denominator. CONTRAST:  23mL OMNIPAQUE IOHEXOL 350 MG/ML SOLN COMPARISON:  CT head without contrast 07/19/20. CTA of the head and neck 07/17/2016. MR head without and with contrast 07/14/2016 FINDINGS: CTA NECK FINDINGS Aortic arch: Atherosclerotic calcifications are present at the aortic arch great vessel origins without aneurysm or stenosis. Right carotid system: Proximal right common carotid artery is somewhat tortuous. Atherosclerotic changes are present at the bifurcation without significant stenosis. There is mild tortuosity of the cervical right ICA without significant stenosis. Left carotid system: Left common carotid artery demonstrates proximal mural calcification without significant stenosis. There is distal tortuosity proximal to the bifurcation. Atherosclerotic irregularity is present at the bifurcation and proximal left ICA without significant stenosis. Vertebral arteries: The left vertebral artery is slightly dominant to the right. Moderate stenosis is present at the origin of the right vertebral artery. Both vertebral arteries originate from the subclavian arteries. No additional stenoses are present in the neck. Skeleton: Multilevel degenerative changes are again noted  in the cervical spine with degenerative anterolisthesis at C4-5, stable. Multilevel facet disease is stable. No focal lytic or blastic lesions are present. Other neck: Soft tissues the neck are unremarkable. Upper chest: Previously noted right apical airspace disease has cleared. Right upper lobe pulmonary nodule is stable to decreased in size. Review of the MIP images confirms the above findings CTA HEAD FINDINGS Anterior circulation: Atherosclerotic calcifications are present at the ophthalmic segment of both internal carotid arteries without significant stenosis relative to the ICA termini. The A1 and M1 segments are normal. The left A1 segment is dominant. MCA bifurcations are intact. The ACA and MCA branch vessels are within normal limits. Posterior circulation: The left vertebral artery is the dominant vessel. There is moderate stenosis in the right V3 segment. Right PICA is not well seen. Basilar artery is normal. The right posterior cerebral artery is fed by a posterior communicating artery and right P1 segment. The left posterior cerebral artery is of fetal type. PCA branch vessels are within normal limits bilaterally. Venous sinuses: The dural sinuses are patent. Straight sinus and deep cerebral veins are intact. Cortical veins are unremarkable. Anatomic variants: Fetal type left posterior cerebral artery. Prominent right posterior communicating artery. Review of the MIP images confirms the above findings IMPRESSION: 1. No emergent large vessel occlusion. 2. Moderate stenosis at the origin of the right vertebral artery. 3. Moderate stenosis of the right V3 segment, progressive from the prior study. 4. Fetal type left posterior cerebral artery. 5. Stable to decreased size of right upper lobe pulmonary nodule. 6. Aortic Atherosclerosis (ICD10-I70.0). Electronically Signed   By: San Morelle M.D.   On: 07/19/2020 18:30   CT Chest W Contrast  Result Date: 08/09/2020 CLINICAL DATA:  Non-small cell  lung cancer.  Restaging. EXAM: CT CHEST, ABDOMEN, AND PELVIS WITH CONTRAST TECHNIQUE: Multidetector CT imaging of the chest, abdomen and pelvis was performed following the standard protocol during bolus administration of intravenous contrast. CONTRAST:  13mL OMNIPAQUE IOHEXOL 300 MG/ML  SOLN COMPARISON:  05/09/2020 FINDINGS: CT CHEST FINDINGS Cardiovascular: Left chest wall pacer device is noted with leads in the right atrial appendage and right ventricle. Normal heart size. Aortic atherosclerosis. Coronary artery calcifications. Mediastinum/Nodes: Normal appearance of the  thyroid gland. The trachea appears patent and is midline. Normal appearance of the esophagus. No enlarged axillary, supraclavicular, mediastinal, or hilar lymph nodes. Lungs/Pleura: No pleural effusion. Index bandlike density within the anterior right upper lobe measures 2.3 x 1.1 cm, image 35/6. Unchanged from previous exam. Numerous milli metric lung nodules are noted throughout both lungs which appear similar to the previous exam. Musculoskeletal: Remote healed mid body of sternum fracture. No acute or suspicious bone lesions. CT ABDOMEN PELVIS FINDINGS Hepatobiliary: Multifocal liver metastases are again noted. Index lesion within segment 3 measures 4.3 x 5.2 cm, image 48/2. Previously 4.0 x 4.7 cm. Index lesion within segment 2 measures 2.3 cm, image 40/2. Unchanged. Index lesion within segment 8 measures 1.8 x 1.6 cm, image 37/2. Previously 1.5 x 1.2 cm. Index lesion within segment 7 measures 4.3 x 3.4 cm, image 37/2. Previously 3.9 by 3.3 cm. Small stones are again noted layering within the gallbladder. Mild central left intrahepatic ductal dilatation is unchanged. No common bile duct dilatation. Pancreas: Unremarkable. No pancreatic ductal dilatation or surrounding inflammatory changes. Spleen: Normal in size without focal abnormality. Adrenals/Urinary Tract: Normal adrenal glands. No kidney mass or hydronephrosis. Urinary bladder is  unremarkable. Stomach/Bowel: Surgical clips are noted within the stomach. No bowel wall thickening, inflammation, or distension. Vascular/Lymphatic: Aortic atherosclerosis. IVC filter. No abdominopelvic adenopathy. Reproductive: Status post hysterectomy. No adnexal masses. Other: No free fluid or fluid collections Musculoskeletal: Degenerative disc disease noted within the thoracolumbar spine. No acute or suspicious bone lesions identified. IMPRESSION: 1. Mild progression multifocal liver metastases. 2. Numerous subcentimeter lung nodules are noted throughout both lungs which appear similar to previous exam. 3. Aortic atherosclerosis and coronary artery calcifications. 4. Gallstones. Aortic Atherosclerosis (ICD10-I70.0). Electronically Signed   By: Kerby Moors M.D.   On: 08/09/2020 14:24   CT Abdomen Pelvis W Contrast  Result Date: 08/09/2020 CLINICAL DATA:  Non-small cell lung cancer.  Restaging. EXAM: CT CHEST, ABDOMEN, AND PELVIS WITH CONTRAST TECHNIQUE: Multidetector CT imaging of the chest, abdomen and pelvis was performed following the standard protocol during bolus administration of intravenous contrast. CONTRAST:  182mL OMNIPAQUE IOHEXOL 300 MG/ML  SOLN COMPARISON:  05/09/2020 FINDINGS: CT CHEST FINDINGS Cardiovascular: Left chest wall pacer device is noted with leads in the right atrial appendage and right ventricle. Normal heart size. Aortic atherosclerosis. Coronary artery calcifications. Mediastinum/Nodes: Normal appearance of the thyroid gland. The trachea appears patent and is midline. Normal appearance of the esophagus. No enlarged axillary, supraclavicular, mediastinal, or hilar lymph nodes. Lungs/Pleura: No pleural effusion. Index bandlike density within the anterior right upper lobe measures 2.3 x 1.1 cm, image 35/6. Unchanged from previous exam. Numerous milli metric lung nodules are noted throughout both lungs which appear similar to the previous exam. Musculoskeletal: Remote healed mid  body of sternum fracture. No acute or suspicious bone lesions. CT ABDOMEN PELVIS FINDINGS Hepatobiliary: Multifocal liver metastases are again noted. Index lesion within segment 3 measures 4.3 x 5.2 cm, image 48/2. Previously 4.0 x 4.7 cm. Index lesion within segment 2 measures 2.3 cm, image 40/2. Unchanged. Index lesion within segment 8 measures 1.8 x 1.6 cm, image 37/2. Previously 1.5 x 1.2 cm. Index lesion within segment 7 measures 4.3 x 3.4 cm, image 37/2. Previously 3.9 by 3.3 cm. Small stones are again noted layering within the gallbladder. Mild central left intrahepatic ductal dilatation is unchanged. No common bile duct dilatation. Pancreas: Unremarkable. No pancreatic ductal dilatation or surrounding inflammatory changes. Spleen: Normal in size without focal abnormality. Adrenals/Urinary Tract: Normal adrenal glands.  No kidney mass or hydronephrosis. Urinary bladder is unremarkable. Stomach/Bowel: Surgical clips are noted within the stomach. No bowel wall thickening, inflammation, or distension. Vascular/Lymphatic: Aortic atherosclerosis. IVC filter. No abdominopelvic adenopathy. Reproductive: Status post hysterectomy. No adnexal masses. Other: No free fluid or fluid collections Musculoskeletal: Degenerative disc disease noted within the thoracolumbar spine. No acute or suspicious bone lesions identified. IMPRESSION: 1. Mild progression multifocal liver metastases. 2. Numerous subcentimeter lung nodules are noted throughout both lungs which appear similar to previous exam. 3. Aortic atherosclerosis and coronary artery calcifications. 4. Gallstones. Aortic Atherosclerosis (ICD10-I70.0). Electronically Signed   By: Kerby Moors M.D.   On: 08/09/2020 14:24   DG Shoulder Left  Result Date: 07/19/2020 CLINICAL DATA:  Left shoulder pain after fall. EXAM: LEFT SHOULDER - 2+ VIEW COMPARISON:  None. FINDINGS: No acute fracture or dislocation. Mild acromioclavicular and glenohumeral degenerative changes. Soft  tissues are unremarkable. IMPRESSION: 1. No acute osseous abnormality. Electronically Signed   By: Titus Dubin M.D.   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 She is currently undergoing systemic chemotherapy with Xeloda and Temodar status post 43 cycles.   The patient has been tolerating the treatment well except for the increasing fatigue and weakness secondary to anemia likely chemotherapy-induced versus iron deficiency anemia secondary to gastrointestinal blood loss.  She is currently off treatment with Xarelto. The patient has been on observation for the last few months and she is feeling fine with no concerning complaints except for the fatigue and swelling of the lower extremities. She had repeat CT scan of the chest, abdomen pelvis performed recently.  I personally and independently reviewed the scan and discussed the results with the patient and her husband. Her scan showed a stable disease in the chest but there was mild disease progression in the liver. I discussed with the patient several options for management of her condition including continuous observation and monitoring of the liver lesion versus resuming her treatment with Xeloda and Temodar.  The patient would like to continue on observation for now. I will see her back for follow-up visit in 3 months with repeat CT scan of the chest, abdomen pelvis for restaging of her disease. For the iron deficiency anemia, she has significant improvement in her hemoglobin and hematocrit after starting treatment with Integra plus.  I will give the patient a refill of her  medication. For the hypertension she will continue to monitor her blood pressure closely at home. For the swelling of the lower extremities, the patient will continue on Lasix on as-needed basis as prescribed by her primary care physician. The patient was advised to call immediately if she has any concerning symptoms in the interval.  The patient voices understanding of current disease status and treatment options and is in agreement with the current care plan. All questions were answered. The patient knows to call the clinic with any problems, questions or concerns. We can certainly see the patient much sooner if necessary.  Disclaimer: This note was dictated with voice recognition software. Similar sounding words can inadvertently be transcribed and may not be corrected upon review.

## 2020-08-22 ENCOUNTER — Other Ambulatory Visit: Payer: Self-pay | Admitting: Family Medicine

## 2020-08-29 ENCOUNTER — Ambulatory Visit (INDEPENDENT_AMBULATORY_CARE_PROVIDER_SITE_OTHER): Payer: Medicare Other

## 2020-08-29 DIAGNOSIS — I495 Sick sinus syndrome: Secondary | ICD-10-CM | POA: Diagnosis not present

## 2020-08-29 DIAGNOSIS — Z950 Presence of cardiac pacemaker: Secondary | ICD-10-CM

## 2020-08-29 LAB — CUP PACEART REMOTE DEVICE CHECK
Battery Remaining Longevity: 141 mo
Battery Voltage: 3.02 V
Brady Statistic AP VP Percent: 0.11 %
Brady Statistic AP VS Percent: 93.25 %
Brady Statistic AS VP Percent: 0.01 %
Brady Statistic AS VS Percent: 6.64 %
Brady Statistic RA Percent Paced: 93.32 %
Brady Statistic RV Percent Paced: 0.11 %
Date Time Interrogation Session: 20220131004026
Implantable Lead Implant Date: 20191024
Implantable Lead Implant Date: 20191024
Implantable Lead Location: 753859
Implantable Lead Location: 753860
Implantable Lead Model: 5076
Implantable Lead Model: 5076
Implantable Pulse Generator Implant Date: 20191024
Lead Channel Impedance Value: 304 Ohm
Lead Channel Impedance Value: 323 Ohm
Lead Channel Impedance Value: 380 Ohm
Lead Channel Impedance Value: 513 Ohm
Lead Channel Pacing Threshold Amplitude: 0.75 V
Lead Channel Pacing Threshold Amplitude: 1.125 V
Lead Channel Pacing Threshold Pulse Width: 0.4 ms
Lead Channel Pacing Threshold Pulse Width: 0.4 ms
Lead Channel Sensing Intrinsic Amplitude: 1 mV
Lead Channel Sensing Intrinsic Amplitude: 1 mV
Lead Channel Sensing Intrinsic Amplitude: 13.75 mV
Lead Channel Sensing Intrinsic Amplitude: 13.75 mV
Lead Channel Setting Pacing Amplitude: 1.5 V
Lead Channel Setting Pacing Amplitude: 2.25 V
Lead Channel Setting Pacing Pulse Width: 0.4 ms
Lead Channel Setting Sensing Sensitivity: 1.2 mV

## 2020-09-04 ENCOUNTER — Other Ambulatory Visit: Payer: Self-pay

## 2020-09-04 ENCOUNTER — Inpatient Hospital Stay (HOSPITAL_COMMUNITY): Payer: Medicare Other

## 2020-09-04 ENCOUNTER — Inpatient Hospital Stay (HOSPITAL_COMMUNITY)
Admission: EM | Admit: 2020-09-04 | Discharge: 2020-09-07 | DRG: 640 | Disposition: A | Discharge status: Hospice | Payer: Medicare Other | Attending: Internal Medicine | Admitting: Internal Medicine

## 2020-09-04 DIAGNOSIS — Z79890 Hormone replacement therapy (postmenopausal): Secondary | ICD-10-CM

## 2020-09-04 DIAGNOSIS — Z7901 Long term (current) use of anticoagulants: Secondary | ICD-10-CM

## 2020-09-04 DIAGNOSIS — C7B8 Other secondary neuroendocrine tumors: Secondary | ICD-10-CM | POA: Diagnosis not present

## 2020-09-04 DIAGNOSIS — R001 Bradycardia, unspecified: Secondary | ICD-10-CM | POA: Diagnosis not present

## 2020-09-04 DIAGNOSIS — F419 Anxiety disorder, unspecified: Secondary | ICD-10-CM | POA: Diagnosis present

## 2020-09-04 DIAGNOSIS — Z9221 Personal history of antineoplastic chemotherapy: Secondary | ICD-10-CM

## 2020-09-04 DIAGNOSIS — Z833 Family history of diabetes mellitus: Secondary | ICD-10-CM | POA: Diagnosis not present

## 2020-09-04 DIAGNOSIS — Z515 Encounter for palliative care: Secondary | ICD-10-CM | POA: Diagnosis not present

## 2020-09-04 DIAGNOSIS — J9601 Acute respiratory failure with hypoxia: Secondary | ICD-10-CM | POA: Diagnosis not present

## 2020-09-04 DIAGNOSIS — G319 Degenerative disease of nervous system, unspecified: Secondary | ICD-10-CM | POA: Diagnosis not present

## 2020-09-04 DIAGNOSIS — G9341 Metabolic encephalopathy: Secondary | ICD-10-CM | POA: Diagnosis present

## 2020-09-04 DIAGNOSIS — Z20822 Contact with and (suspected) exposure to covid-19: Secondary | ICD-10-CM | POA: Diagnosis present

## 2020-09-04 DIAGNOSIS — Z8249 Family history of ischemic heart disease and other diseases of the circulatory system: Secondary | ICD-10-CM

## 2020-09-04 DIAGNOSIS — R404 Transient alteration of awareness: Secondary | ICD-10-CM | POA: Diagnosis not present

## 2020-09-04 DIAGNOSIS — R110 Nausea: Secondary | ICD-10-CM | POA: Diagnosis not present

## 2020-09-04 DIAGNOSIS — Z800 Family history of malignant neoplasm of digestive organs: Secondary | ICD-10-CM | POA: Diagnosis not present

## 2020-09-04 DIAGNOSIS — R4182 Altered mental status, unspecified: Secondary | ICD-10-CM | POA: Diagnosis not present

## 2020-09-04 DIAGNOSIS — E872 Acidosis: Secondary | ICD-10-CM | POA: Diagnosis present

## 2020-09-04 DIAGNOSIS — E871 Hypo-osmolality and hyponatremia: Principal | ICD-10-CM | POA: Diagnosis present

## 2020-09-04 DIAGNOSIS — R0902 Hypoxemia: Secondary | ICD-10-CM | POA: Diagnosis not present

## 2020-09-04 DIAGNOSIS — I480 Paroxysmal atrial fibrillation: Secondary | ICD-10-CM | POA: Diagnosis not present

## 2020-09-04 DIAGNOSIS — Z825 Family history of asthma and other chronic lower respiratory diseases: Secondary | ICD-10-CM

## 2020-09-04 DIAGNOSIS — Z79899 Other long term (current) drug therapy: Secondary | ICD-10-CM

## 2020-09-04 DIAGNOSIS — G40909 Epilepsy, unspecified, not intractable, without status epilepticus: Secondary | ICD-10-CM | POA: Diagnosis present

## 2020-09-04 DIAGNOSIS — I495 Sick sinus syndrome: Secondary | ICD-10-CM | POA: Diagnosis present

## 2020-09-04 DIAGNOSIS — Z8042 Family history of malignant neoplasm of prostate: Secondary | ICD-10-CM

## 2020-09-04 DIAGNOSIS — R531 Weakness: Secondary | ICD-10-CM

## 2020-09-04 DIAGNOSIS — Z86711 Personal history of pulmonary embolism: Secondary | ICD-10-CM | POA: Diagnosis not present

## 2020-09-04 DIAGNOSIS — F32A Depression, unspecified: Secondary | ICD-10-CM | POA: Diagnosis present

## 2020-09-04 DIAGNOSIS — R600 Localized edema: Secondary | ICD-10-CM | POA: Diagnosis not present

## 2020-09-04 DIAGNOSIS — R0689 Other abnormalities of breathing: Secondary | ICD-10-CM | POA: Diagnosis not present

## 2020-09-04 DIAGNOSIS — Z66 Do not resuscitate: Secondary | ICD-10-CM | POA: Diagnosis not present

## 2020-09-04 DIAGNOSIS — C7A1 Malignant poorly differentiated neuroendocrine tumors: Secondary | ICD-10-CM | POA: Diagnosis not present

## 2020-09-04 DIAGNOSIS — C7A090 Malignant carcinoid tumor of the bronchus and lung: Secondary | ICD-10-CM | POA: Diagnosis present

## 2020-09-04 DIAGNOSIS — Z87891 Personal history of nicotine dependence: Secondary | ICD-10-CM

## 2020-09-04 DIAGNOSIS — Z8673 Personal history of transient ischemic attack (TIA), and cerebral infarction without residual deficits: Secondary | ICD-10-CM | POA: Diagnosis not present

## 2020-09-04 DIAGNOSIS — Z7189 Other specified counseling: Secondary | ICD-10-CM | POA: Diagnosis not present

## 2020-09-04 DIAGNOSIS — C7A8 Other malignant neuroendocrine tumors: Secondary | ICD-10-CM | POA: Diagnosis not present

## 2020-09-04 DIAGNOSIS — Z7401 Bed confinement status: Secondary | ICD-10-CM | POA: Diagnosis not present

## 2020-09-04 DIAGNOSIS — R609 Edema, unspecified: Secondary | ICD-10-CM | POA: Diagnosis not present

## 2020-09-04 DIAGNOSIS — C3490 Malignant neoplasm of unspecified part of unspecified bronchus or lung: Secondary | ICD-10-CM | POA: Diagnosis not present

## 2020-09-04 DIAGNOSIS — Z950 Presence of cardiac pacemaker: Secondary | ICD-10-CM

## 2020-09-04 DIAGNOSIS — C7B02 Secondary carcinoid tumors of liver: Secondary | ICD-10-CM | POA: Diagnosis present

## 2020-09-04 DIAGNOSIS — C787 Secondary malignant neoplasm of liver and intrahepatic bile duct: Secondary | ICD-10-CM | POA: Diagnosis not present

## 2020-09-04 DIAGNOSIS — I10 Essential (primary) hypertension: Secondary | ICD-10-CM | POA: Diagnosis not present

## 2020-09-04 DIAGNOSIS — M6281 Muscle weakness (generalized): Secondary | ICD-10-CM | POA: Diagnosis not present

## 2020-09-04 DIAGNOSIS — G311 Senile degeneration of brain, not elsewhere classified: Secondary | ICD-10-CM | POA: Diagnosis not present

## 2020-09-04 DIAGNOSIS — I6782 Cerebral ischemia: Secondary | ICD-10-CM | POA: Diagnosis not present

## 2020-09-04 DIAGNOSIS — R4020 Unspecified coma: Secondary | ICD-10-CM | POA: Diagnosis not present

## 2020-09-04 DIAGNOSIS — I351 Nonrheumatic aortic (valve) insufficiency: Secondary | ICD-10-CM | POA: Diagnosis not present

## 2020-09-04 LAB — COMPREHENSIVE METABOLIC PANEL
ALT: 21 U/L (ref 0–44)
AST: 36 U/L (ref 15–41)
Albumin: 4.1 g/dL (ref 3.5–5.0)
Alkaline Phosphatase: 82 U/L (ref 38–126)
Anion gap: 11 (ref 5–15)
BUN: 11 mg/dL (ref 8–23)
CO2: 18 mmol/L — ABNORMAL LOW (ref 22–32)
Calcium: 9.3 mg/dL (ref 8.9–10.3)
Chloride: 90 mmol/L — ABNORMAL LOW (ref 98–111)
Creatinine, Ser: 0.6 mg/dL (ref 0.44–1.00)
GFR, Estimated: >60 mL/min (ref >60)
Glucose, Bld: 144 mg/dL — ABNORMAL HIGH (ref 70–99)
Potassium: 3.5 mmol/L (ref 3.5–5.1)
Sodium: 119 mmol/L — CL (ref 135–145)
Total Bilirubin: 1.1 mg/dL (ref 0.3–1.2)
Total Protein: 7.3 g/dL (ref 6.5–8.1)

## 2020-09-04 LAB — CBC
HCT: 41.8 % (ref 36.0–46.0)
Hemoglobin: 14.5 g/dL (ref 12.0–15.0)
MCH: 32.2 pg (ref 26.0–34.0)
MCHC: 34.7 g/dL (ref 30.0–36.0)
MCV: 92.9 fL (ref 80.0–100.0)
Platelets: 153 10*3/uL (ref 150–400)
RBC: 4.5 MIL/uL (ref 3.87–5.11)
RDW: 14 % (ref 11.5–15.5)
WBC: 7.3 10*3/uL (ref 4.0–10.5)
nRBC: 0 % (ref 0.0–0.2)

## 2020-09-04 LAB — TSH: TSH: 6.298 u[IU]/mL — ABNORMAL HIGH (ref 0.350–4.500)

## 2020-09-04 LAB — MAGNESIUM: Magnesium: 1.8 mg/dL (ref 1.7–2.4)

## 2020-09-04 MED ORDER — HYDRALAZINE HCL 20 MG/ML IJ SOLN
10.0000 mg | Freq: Once | INTRAMUSCULAR | Status: AC
  Administered 2020-09-04: 10 mg via INTRAVENOUS
  Filled 2020-09-04: qty 1

## 2020-09-04 MED ORDER — AMLODIPINE BESYLATE 5 MG PO TABS
10.0000 mg | ORAL_TABLET | Freq: Once | ORAL | Status: AC
  Administered 2020-09-04: 10 mg via ORAL
  Filled 2020-09-04: qty 2

## 2020-09-04 MED ORDER — ONDANSETRON HCL 4 MG/2ML IJ SOLN
4.0000 mg | Freq: Four times a day (QID) | INTRAMUSCULAR | Status: DC | PRN
  Administered 2020-09-05: 4 mg via INTRAVENOUS
  Filled 2020-09-04: qty 2

## 2020-09-04 MED ORDER — SODIUM CHLORIDE 0.9 % IV SOLN: INTRAVENOUS | Status: DC

## 2020-09-04 MED ORDER — SODIUM CHLORIDE 0.9 % IV BOLUS
1000.0000 mL | Freq: Once | INTRAVENOUS | Status: AC
  Administered 2020-09-04: 1000 mL via INTRAVENOUS

## 2020-09-04 MED ORDER — LORAZEPAM 2 MG/ML IJ SOLN
1.0000 mg | Freq: Every day | INTRAMUSCULAR | Status: AC
  Administered 2020-09-04: 1 mg via INTRAVENOUS
  Filled 2020-09-04: qty 1

## 2020-09-04 MED ORDER — SODIUM CHLORIDE 0.9 % IV BOLUS
500.0000 mL | Freq: Once | INTRAVENOUS | Status: AC
  Administered 2020-09-04: 500 mL via INTRAVENOUS

## 2020-09-04 MED ORDER — LORAZEPAM 2 MG/ML IJ SOLN
0.5000 mg | Freq: Once | INTRAMUSCULAR | Status: AC
  Administered 2020-09-04: 0.5 mg via INTRAVENOUS
  Filled 2020-09-04: qty 1

## 2020-09-04 MED ORDER — HYDRALAZINE HCL 20 MG/ML IJ SOLN: 10.0000 mg | INTRAMUSCULAR | Status: DC | PRN

## 2020-09-04 MED ORDER — ACETAMINOPHEN 325 MG PO TABS: 650.0000 mg | ORAL_TABLET | Freq: Four times a day (QID) | ORAL | Status: DC | PRN

## 2020-09-04 MED ORDER — POTASSIUM CHLORIDE CRYS ER 20 MEQ PO TBCR
40.0000 meq | EXTENDED_RELEASE_TABLET | Freq: Once | ORAL | Status: AC
  Administered 2020-09-04: 40 meq via ORAL
  Filled 2020-09-04: qty 2

## 2020-09-04 MED ORDER — ONDANSETRON HCL 4 MG PO TABS: 4.0000 mg | ORAL_TABLET | Freq: Four times a day (QID) | ORAL | Status: DC | PRN

## 2020-09-04 MED ORDER — ENOXAPARIN SODIUM 40 MG/0.4ML ~~LOC~~ SOLN
40.0000 mg | Freq: Every day | SUBCUTANEOUS | Status: DC
  Administered 2020-09-04: 40 mg via SUBCUTANEOUS
  Filled 2020-09-04: qty 0.4

## 2020-09-04 MED ORDER — ONDANSETRON HCL 4 MG/2ML IJ SOLN
4.0000 mg | Freq: Once | INTRAMUSCULAR | Status: AC
  Administered 2020-09-04: 4 mg via INTRAVENOUS
  Filled 2020-09-04: qty 2

## 2020-09-04 MED ORDER — ACETAMINOPHEN 650 MG RE SUPP
650.0000 mg | Freq: Four times a day (QID) | RECTAL | Status: DC | PRN
  Administered 2020-09-05: 650 mg via RECTAL
  Filled 2020-09-04: qty 1

## 2020-09-04 NOTE — ED Notes (Signed)
Pt placed on purewick 

## 2020-09-04 NOTE — ED Provider Notes (Addendum)
[facility] EMERGENCY DEPARTMENT Provider Note   CSN: [id number] Arrival date & time: 09/04/20  1821     History Chief Complaint  Patient presents with  . Leg Swelling  . Nausea    [name] is a 87 y.o. female.  Patient with hx metastatic ca, presents with chronic bilateral leg swelling, and also notes nausea/dry heaves, and feeling generally weak in the past week. Symptoms gradual onset, constant, moderate, persistent, slowly getting worse. Denies any focal or unilateral numbness/weakness. Denies headache. No chest pain or discomfort. No sob. Denies cough or fever. No abd pain. +nausea. No distension. Having normal bms. No dysuria or gu c/o.   The history is provided by the patient.       Past Medical History:  Diagnosis Date  . Acute respiratory failure with hypoxia ([place]) 10/23/2015  . Allergic rhinitis    PT. DENIES  . Anxiety   . Aortic insufficiency    Echo 04/29/2018: EF 65-70, mild AS (mean 13), mod AI, Asc Aorta 42 mm (mildly dilated), mild LAE, PASP 41, pericardium normal in appearance.   . Arthritis    NECK  . Ataxia   . Back pain 12/13/2016  . Bradycardia    primarily nocturnal  . Burning tongue syndrome 25 years  . Cataract   . Cerebellar degeneration ([place])   . Chest pain 12/13/2016   Atypical chest pain  . Chronic urinary tract infection   . Complication of anesthesia    low o2 sats, coded 30 years ago  . CVA (cerebral infarction) 05/2003  . Dehydration with hyponatremia 03/23/2018  . Depression   . DVT (deep venous thrombosis) ([place])   . Encounter for antineoplastic chemotherapy 10/09/2016  . Fatigue 01/03/2015  . Gait disorder   . Gastric polyps   . GERD (gastroesophageal reflux disease)   . Goals of care, counseling/discussion 10/09/2016  . H/O: CVA (cerebrovascular accident) 07/14/2016  . High cholesterol   . History of pericarditis   . Hyperlipidemia   . Hypotension   . Hypothyroidism   . IBS (irritable bowel syndrome)   . Liver cancer  ([place]) dx'd 06/2016   liver  . Lung cancer ([place])   . Neuroendocrine cancer ([place]) 08/23/2016  . Obesity   . Paroxysmal atrial fibrillation ([name])    chads2vasc score is 6,  she is felt to be a poor candidate for anticoagulation  . Pericarditis   . Personal history of arterial venous malformation (AVM)    right side of face  . Pulmonary embolism ([place])   . Seizure disorder ([place])   . Seizures ([place]) 2003   " smelling"- Gabapentin "no problem"  . Shortness of breath dyspnea    with exertion  . Sick sinus syndrome ([place])   . Sternum fx 10/27/2013  . Stroke [facility]) 5 years ago   Right side of face weak, slurred speach-   . Thyroid disease   . TIA (transient ischemic attack)   . UTI (lower urinary tract infection) 03/27/2016   "frequently"    Patient Active Problem List   Diagnosis Date Noted  . Vertebral artery insufficiency 07/20/2020  . Acute blood loss anemia 11/07/2019  . Anemia in other chronic diseases classified elsewhere 11/07/2019  . Upper GI bleed 11/06/2019  . Anticoagulated   . Port-A-Cath in place 08/28/2019  . Encounter for antineoplastic chemotherapy 07/07/2019  . Skin breakdown 06/04/2018  . Candida infection 06/04/2018  . Sick sinus syndrome ([place]) 05/22/2018  . Mild aortic stenosis 05/21/2018  . Primary malignant  neoplasm of lung metastatic to other site [facility])   . Macrocytic anemia   . Thrombocytopenia ([place]) 03/23/2018  . Osteoarthritis of knee 01/10/2018  . Pulmonary embolism ([place]) 04/24/2017  . Neuroendocrine carcinoma metastatic to liver ([place]) 12/13/2016  . Liver metastases ([place]) 08/23/2016  . Late effect of cerebrovascular accident (CVA) 08/02/2016  . Meningioma ([place]) 07/14/2016  . Asthmatic bronchitis 10/23/2015  . GAD (generalized anxiety disorder) 09/30/2015  . Peripheral edema 09/30/2015  . Insomnia 09/30/2015  . Localization-related partial epilepsy with simple partial seizures ([place]) 06/27/2015  . Sinus bradycardia 01/03/2015  . Paroxysmal atrial  fibrillation ([place]) 01/03/2015  . Morbid obesity ([place]) 01/03/2015  . Solitary pulmonary nodule 06/02/2014  . Thyroid nodule 05/08/2014  . Depression   . Seizure disorder ([place])   . Hypothyroidism   . Cerebellar degeneration ([place])   . Irritable bowel syndrome 02/18/2008  . Hyperlipemia 09/10/2006  . Essential hypertension 09/10/2006  . GERD (gastroesophageal reflux disease) 09/10/2006    Past Surgical History:  Procedure Laterality Date  . APPENDECTOMY  87 years old  . COLONOSCOPY  2006, 2009  . COLONOSCOPY WITH PROPOFOL N/A 03/28/2016   Procedure: COLONOSCOPY WITH PROPOFOL;  Surgeon: Gatha Mayer, MD;  Location: [place];  Service: Endoscopy;  Laterality: N/A;  . CORONARY ANGIOGRAPHY N/A 05/22/2018   Procedure: CORONARY ANGIOGRAPHY (CATH LAB);  Surgeon: Lorretta Harp, MD;  Location: [place] CV LAB;  Service: Cardiovascular;  Laterality: N/A;  . cyst removed  35 years ago  . EP IMPLANTABLE DEVICE N/A 01/06/2015   Procedure: Loop Recorder Insertion;  Surgeon: Thompson Grayer, MD;  Location: [place] CV LAB;  Service: Cardiovascular;  Laterality: N/A;  . ESOPHAGOGASTRODUODENOSCOPY (EGD) WITH PROPOFOL N/A 11/07/2019   Procedure: ESOPHAGOGASTRODUODENOSCOPY (EGD) WITH PROPOFOL;  Surgeon: Yetta Flock, MD;  Location: [facility];  Service: Gastroenterology;  Laterality: N/A;  . EYE SURGERY Right    Cataract  . HEMOSTASIS CLIP PLACEMENT  11/07/2019   Procedure: HEMOSTASIS CLIP PLACEMENT;  Surgeon: Yetta Flock, MD;  [facility];  Service: Gastroenterology;;  . HEMOSTASIS CONTROL  11/07/2019   Procedure: HEMOSTASIS CONTROL;  Surgeon: Yetta Flock, MD;  [facility];  Service: Gastroenterology;;  . Chrystine Oiler SELECTIVE EACH ADDITIONAL VESSEL  11/28/2016  . IR ANGIOGRAM SELECTIVE EACH ADDITIONAL VESSEL  11/28/2016  . IR ANGIOGRAM SELECTIVE EACH ADDITIONAL VESSEL  11/28/2016  . IR ANGIOGRAM SELECTIVE EACH ADDITIONAL VESSEL  11/28/2016  . IR  ANGIOGRAM SELECTIVE EACH ADDITIONAL VESSEL  12/13/2016  . IR ANGIOGRAM SELECTIVE EACH ADDITIONAL VESSEL  12/13/2016  . IR ANGIOGRAM SELECTIVE EACH ADDITIONAL VESSEL  01/09/2017  . IR ANGIOGRAM VISCERAL SELECTIVE  11/28/2016  . IR ANGIOGRAM VISCERAL SELECTIVE  11/28/2016  . IR ANGIOGRAM VISCERAL SELECTIVE  12/13/2016  . IR ANGIOGRAM VISCERAL SELECTIVE  12/13/2016  . IR ANGIOGRAM VISCERAL SELECTIVE  01/09/2017  . IR EMBO ARTERIAL NOT HEMORR HEMANG INC GUIDE ROADMAPPING  11/28/2016  . IR EMBO TUMOR ORGAN ISCHEMIA INFARCT INC GUIDE ROADMAPPING  12/13/2016  . IR EMBO TUMOR ORGAN ISCHEMIA INFARCT INC GUIDE ROADMAPPING  01/09/2017  . IR IVC FILTER PLMT / S&I /IMG GUID/MOD SED  04/24/2017  . IR RADIOLOGIST EVAL & MGMT  11/06/2016  . IR RADIOLOGIST EVAL & MGMT  01/02/2017  . IR RADIOLOGIST EVAL & MGMT  02/05/2017  . IR RADIOLOGIST EVAL & MGMT  05/02/2017  . IR RADIOLOGIST EVAL & MGMT  10/17/2017  . IR US GUIDE VASC ACCESS RIGHT  11/28/2016  . IR US GUIDE VASC ACCESS  RIGHT  12/13/2016  . IR US GUIDE VASC ACCESS RIGHT  01/09/2017  . KNEE ARTHROSCOPY Right 11/14/2006  . KNEE ARTHROSCOPY Bilateral 5 and 6 years ago  . KNEE ARTHROSCOPY WITH LATERAL MENISECTOMY  07/03/2012   Procedure: KNEE ARTHROSCOPY WITH LATERAL MENISECTOMY;  Surgeon: Magnus Sinning, MD;  [facility];  Service: Orthopedics;  Laterality: Left;  with Partial Lateral Menisectomy and Medial Menisectomy. Shaving of medial and lateral femoral condyles. Shaving of patella. Removal of a loose body  . LOOP RECORDER REMOVAL N/A 05/22/2018   Procedure: LOOP RECORDER REMOVAL;  Surgeon: Thompson Grayer, MD;  Location: [place] CV LAB;  Service: Cardiovascular;  Laterality: N/A;  . PACEMAKER IMPLANT N/A 05/22/2018   MDT Azure XT DR MRI implanted by Dr Rayann Heman for sick sinus syndrome  . POLYPECTOMY  11/07/2019   Procedure: POLYPECTOMY;  Surgeon: Yetta Flock, MD;  [facility];  Service: Gastroenterology;;  . tibial and fibular internal fixation  Left   . TOTAL ABDOMINAL HYSTERECTOMY  87 years old  . UPPER GASTROINTESTINAL ENDOSCOPY  2009, 2013     OB History   No obstetric history on file.     Family History  Problem Relation Age of Onset  . Heart attack Father 59       fatal  . Peptic Ulcer Mother   . Coronary artery disease Brother   . Diabetes Brother   . Colon cancer Brother 44  . Colon polyps Brother   . Prostate cancer Brother   . Diabetes Brother   . Prostate cancer Son   . COPD Daughter     Social History   Tobacco Use  . Smoking status: Former Smoker    Packs/day: 0.25    Years: 10.00    Pack years: 2.50    Types: Cigarettes    Quit date: 07/31/1975    Years since quitting: 45.1  . Smokeless tobacco: Never Used  Vaping Use  . Vaping Use: Never used  Substance Use Topics  . Alcohol use: No  . Drug use: No    Home Medications Prior to Admission medications   Medication Sig Start Date End Date Taking? Authorizing Provider  ALPRAZolam Duanne Moron) 0.25 MG tablet TAKE 1 TABLET BY MOUTH AT BEDTIME AS NEEDED FOR ANXIETY 05/11/20   Curt Bears, MD  amLODipine (NORVASC) 10 MG tablet Take 1 tablet (10 mg total) by mouth daily. 07/08/20   [name], MD  escitalopram (LEXAPRO) 20 MG tablet TAKE 1 TABLET AT BEDTIME 06/15/20   [name], MD  FeFum-FePoly-FA-B Cmp-C-Biot (INTEGRA PLUS) CAPS Take 1 capsule by mouth every morning. 08/11/20   Curt Bears, MD  fluticasone [facility]) 50 MCG/ACT nasal spray USE 2 SPRAYS IN EACH NOSTRIL AT BEDTIME 06/06/20   [name], MD  furosemide (LASIX) 20 MG tablet TAKE 1 TABLET DAILY AS NEEDED FOR FLUID RETENTION Patient taking differently: Take 20 mg by mouth daily as needed for fluid. 10/13/19   [name], MD  gabapentin (NEURONTIN) 400 MG capsule 2 CAPSULES THREE TIMES A DAY 06/06/20   [name], MD  HYDROcodone-acetaminophen (NORCO/VICODIN) 5-325 MG tablet Take 1 tablet by mouth every 6 (six) hours as needed for moderate pain. 07/08/20   [name], MD  hydroxypropyl methylcellulose / hypromellose (ISOPTO TEARS / GONIOVISC) 2.5 % ophthalmic solution Place 1 drop into both eyes daily.     [provider]  lovastatin (MEVACOR) 40 MG tablet TAKE 1 TABLET AT BEDTIME Patient taking differently: Take 40 mg by mouth at  bedtime. 10/13/19   [name], MD  Melatonin 3 MG TBDP Take 3-6 mg by mouth at bedtime as needed. Patient taking differently: Take 3-6 mg by mouth at bedtime as needed (sleep). 08/02/16   Eustaquio Maize, MD  ondansetron (ZOFRAN) 8 MG tablet TAKE 1 TABLET EVERY 8 HOURS AS NEEDED FOR NAUSEA OR VOMITING 06/06/20   [name], MD  OXcarbazepine (TRILEPTAL) 150 MG tablet Take 1 tablet in morning and 2 tablets at bedtime. 07/26/20   Pieter Partridge, DO  pantoprazole (PROTONIX) 40 MG tablet TAKE 1 TABLET TWICE A DAY 08/23/20   [name], MD  QUEtiapine (SEROQUEL) 300 MG tablet Take 1 tablet (300 mg total) by mouth at bedtime. 07/08/20   [name], MD  silver sulfADIAZINE (SILVADENE) 1 % cream Apply 1 application topically daily. 06/17/19   [name], MD  SYNTHROID 75 MCG tablet TAKE 1 TABLET DAILY BEFORE BREAKFAST 11/19/19   [name], MD  XARELTO 20 MG TABS tablet TAKE 1 TABLET DAILY WITH SUPPER 07/04/20   [name], MD    Allergies    Levaquin [levofloxacin] and Lisinopril  Review of Systems   Review of Systems  Constitutional: Negative for chills and fever.  HENT: Negative for sore throat.   Eyes: Negative for redness.  Respiratory: Negative for cough and shortness of breath.   Cardiovascular: Negative for chest pain.  Gastrointestinal: Positive for nausea. Negative for abdominal pain and vomiting.  Genitourinary: Negative for dysuria and flank pain.  Musculoskeletal: Negative for back pain and neck pain.  Skin: Negative for rash.  Neurological: Negative for speech difficulty, numbness and headaches.  Hematological: Does not bruise/bleed easily.  Psychiatric/Behavioral: Negative for  agitation.    Physical Exam Updated Vital Signs BP (!) 217/77   Pulse 60   Temp 97.9 F (36.6 C) (Oral)   Resp 20   Ht 1.626 m (5\' 4" )   Wt 90.9 kg   SpO2 94%   BMI 34.40 kg/m   Physical Exam Vitals and nursing note reviewed.  Constitutional:      Appearance: Normal appearance. She is well-developed.  HENT:     Head: Atraumatic.     Nose: Nose normal.     Mouth/Throat:     Mouth: Mucous membranes are moist.  Eyes:     General: No scleral icterus.    Conjunctiva/sclera: Conjunctivae normal.     Pupils: Pupils are equal, round, and reactive to light.  Neck:     Vascular: No carotid bruit.     Trachea: No tracheal deviation.  Cardiovascular:     Rate and Rhythm: Normal rate and regular rhythm.     Pulses: Normal pulses.     Heart sounds: Normal heart sounds. No murmur heard. No friction rub. No gallop.   Pulmonary:     Effort: Pulmonary effort is normal. No respiratory distress.     Breath sounds: Normal breath sounds.  Abdominal:     General: Bowel sounds are normal. There is no distension.     Palpations: Abdomen is soft.     Tenderness: There is no abdominal tenderness. There is no guarding.  Genitourinary:    Comments: No cva tenderness.  Musculoskeletal:        General: No tenderness.     Cervical back: Normal range of motion and neck supple. No rigidity. No muscular tenderness.     Comments: Symmetric bil foot/ankle/lower leg swelling.   Skin:    General: Skin is warm and dry.  Findings: No rash.  Neurological:     Mental Status: She is alert.     Comments: Alert, speech normal. Motor/sens grossly intact bil.   Psychiatric:        Mood and Affect: Mood normal.     ED Results / Procedures / Treatments   Labs (all labs ordered are listed, but only abnormal results are displayed) Results for orders placed or performed during the hospital encounter of 09/04/20  CBC  Result Value Ref Range   WBC 7.3 4.0 - 10.5 K/uL   RBC 4.50 3.87 - 5.11 MIL/uL    Hemoglobin 14.5 12.0 - 15.0 g/dL   HCT 41.8 36.0 - 46.0 %   MCV 92.9 80.0 - 100.0 fL   MCH 32.2 26.0 - 34.0 pg   MCHC 34.7 30.0 - 36.0 g/dL   RDW 14.0 11.5 - 15.5 %   Platelets 153 150 - 400 K/uL   nRBC 0.0 0.0 - 0.2 %  Comprehensive metabolic panel  Result Value Ref Range   Sodium 119 (LL) 135 - 145 mmol/L   Potassium 3.5 3.5 - 5.1 mmol/L   Chloride 90 (L) 98 - 111 mmol/L   CO2 18 (L) 22 - 32 mmol/L   Glucose, Bld 144 (H) 70 - 99 mg/dL   BUN 11 8 - 23 mg/dL   Creatinine, Ser 0.60 0.44 - 1.00 mg/dL   Calcium 9.3 8.9 - 10.3 mg/dL   Total Protein 7.3 6.5 - 8.1 g/dL   Albumin 4.1 3.5 - 5.0 g/dL   AST 36 15 - 41 U/L   ALT 21 0 - 44 U/L   Alkaline Phosphatase 82 38 - 126 U/L   Total Bilirubin 1.1 0.3 - 1.2 mg/dL   GFR, Estimated >60 >60 mL/min   Anion gap 11 5 - 15   *Note: Due to a large number of results and/or encounters for the requested time period, some results have not been displayed. A complete set of results can be found in Results Review.   CT Chest W Contrast  Result Date: 08/09/2020 CLINICAL DATA:  Non-small cell lung cancer.  Restaging. EXAM: CT CHEST, ABDOMEN, AND PELVIS WITH CONTRAST TECHNIQUE: Multidetector CT imaging of the chest, abdomen and pelvis was performed following the standard protocol during bolus administration of intravenous contrast. CONTRAST:  161mL OMNIPAQUE IOHEXOL 300 MG/ML  SOLN COMPARISON:  05/09/2020 FINDINGS: CT CHEST FINDINGS Cardiovascular: Left chest wall pacer device is noted with leads in the right atrial appendage and right ventricle. Normal heart size. Aortic atherosclerosis. Coronary artery calcifications. Mediastinum/Nodes: Normal appearance of the thyroid gland. The trachea appears patent and is midline. Normal appearance of the esophagus. No enlarged axillary, supraclavicular, mediastinal, or hilar lymph nodes. Lungs/Pleura: No pleural effusion. Index bandlike density within the anterior right upper lobe measures 2.3 x 1.1 cm, image 35/6.  Unchanged from previous exam. Numerous milli metric lung nodules are noted throughout both lungs which appear similar to the previous exam. Musculoskeletal: Remote healed mid body of sternum fracture. No acute or suspicious bone lesions. CT ABDOMEN PELVIS FINDINGS Hepatobiliary: Multifocal liver metastases are again noted. Index lesion within segment 3 measures 4.3 x 5.2 cm, image 48/2. Previously 4.0 x 4.7 cm. Index lesion within segment 2 measures 2.3 cm, image 40/2. Unchanged. Index lesion within segment 8 measures 1.8 x 1.6 cm, image 37/2. Previously 1.5 x 1.2 cm. Index lesion within segment 7 measures 4.3 x 3.4 cm, image 37/2. Previously 3.9 by 3.3 cm. Small stones are again noted layering within the  gallbladder. Mild central left intrahepatic ductal dilatation is unchanged. No common bile duct dilatation. Pancreas: Unremarkable. No pancreatic ductal dilatation or surrounding inflammatory changes. Spleen: Normal in size without focal abnormality. Adrenals/Urinary Tract: Normal adrenal glands. No kidney mass or hydronephrosis. Urinary bladder is unremarkable. Stomach/Bowel: Surgical clips are noted within the stomach. No bowel wall thickening, inflammation, or distension. Vascular/Lymphatic: Aortic atherosclerosis. IVC filter. No abdominopelvic adenopathy. Reproductive: Status post hysterectomy. No adnexal masses. Other: No free fluid or fluid collections Musculoskeletal: Degenerative disc disease noted within the thoracolumbar spine. No acute or suspicious bone lesions identified. IMPRESSION: 1. Mild progression multifocal liver metastases. 2. Numerous subcentimeter lung nodules are noted throughout both lungs which appear similar to previous exam. 3. Aortic atherosclerosis and coronary artery calcifications. 4. Gallstones. Aortic Atherosclerosis (ICD10-I70.0). Electronically Signed   By: Kerby Moors M.D.   On: 08/09/2020 14:24   CT Abdomen Pelvis W Contrast  Result Date: 08/09/2020 CLINICAL DATA:   Non-small cell lung cancer.  Restaging. EXAM: CT CHEST, ABDOMEN, AND PELVIS WITH CONTRAST TECHNIQUE: Multidetector CT imaging of the chest, abdomen and pelvis was performed following the standard protocol during bolus administration of intravenous contrast. CONTRAST:  121mL OMNIPAQUE IOHEXOL 300 MG/ML  SOLN COMPARISON:  05/09/2020 FINDINGS: CT CHEST FINDINGS Cardiovascular: Left chest wall pacer device is noted with leads in the right atrial appendage and right ventricle. Normal heart size. Aortic atherosclerosis. Coronary artery calcifications. Mediastinum/Nodes: Normal appearance of the thyroid gland. The trachea appears patent and is midline. Normal appearance of the esophagus. No enlarged axillary, supraclavicular, mediastinal, or hilar lymph nodes. Lungs/Pleura: No pleural effusion. Index bandlike density within the anterior right upper lobe measures 2.3 x 1.1 cm, image 35/6. Unchanged from previous exam. Numerous milli metric lung nodules are noted throughout both lungs which appear similar to the previous exam. Musculoskeletal: Remote healed mid body of sternum fracture. No acute or suspicious bone lesions. CT ABDOMEN PELVIS FINDINGS Hepatobiliary: Multifocal liver metastases are again noted. Index lesion within segment 3 measures 4.3 x 5.2 cm, image 48/2. Previously 4.0 x 4.7 cm. Index lesion within segment 2 measures 2.3 cm, image 40/2. Unchanged. Index lesion within segment 8 measures 1.8 x 1.6 cm, image 37/2. Previously 1.5 x 1.2 cm. Index lesion within segment 7 measures 4.3 x 3.4 cm, image 37/2. Previously 3.9 by 3.3 cm. Small stones are again noted layering within the gallbladder. Mild central left intrahepatic ductal dilatation is unchanged. No common bile duct dilatation. Pancreas: Unremarkable. No pancreatic ductal dilatation or surrounding inflammatory changes. Spleen: Normal in size without focal abnormality. Adrenals/Urinary Tract: Normal adrenal glands. No kidney mass or hydronephrosis. Urinary  bladder is unremarkable. Stomach/Bowel: Surgical clips are noted within the stomach. No bowel wall thickening, inflammation, or distension. Vascular/Lymphatic: Aortic atherosclerosis. IVC filter. No abdominopelvic adenopathy. Reproductive: Status post hysterectomy. No adnexal masses. Other: No free fluid or fluid collections Musculoskeletal: Degenerative disc disease noted within the thoracolumbar spine. No acute or suspicious bone lesions identified. IMPRESSION: 1. Mild progression multifocal liver metastases. 2. Numerous subcentimeter lung nodules are noted throughout both lungs which appear similar to previous exam. 3. Aortic atherosclerosis and coronary artery calcifications. 4. Gallstones. Aortic Atherosclerosis (ICD10-I70.0). Electronically Signed   By: Kerby Moors M.D.   On: 08/09/2020 14:24   CUP PACEART REMOTE DEVICE CHECK  Result Date: 08/29/2020 Scheduled remote reviewed. Normal device function.  1 AF episode, < 1 minute Next remote 91 days.   ED ECG REPORT   Date: 09/04/2020  Rate: 63  Rhythm: normal sinus rhythm  QRS Axis:  normal  Intervals: normal  ST/T Wave abnormalities: normal  Conduction Disutrbances:none  Narrative Interpretation:   Old EKG Reviewed: changes noted  I have personally reviewed the EKG tracing   Radiology No results found.  Procedures Procedures   Medications Ordered in ED Medications  sodium chloride 0.9 % bolus 500 mL (500 mLs Intravenous New Bag/Given 09/04/20 1856)  ondansetron (ZOFRAN) injection 4 mg (4 mg Intravenous Given 09/04/20 1853)  LORazepam (ATIVAN) injection 0.5 mg (0.5 mg Intravenous Given 09/04/20 1855)    ED Course  I have reviewed the triage vital signs and the nursing notes.  Pertinent labs & imaging results that were available during my care of the patient were reviewed by me and considered in my medical decision making (see chart for details).    MDM Rules/Calculators/A&P                         Iv ns. Continuous pulse ox  and cardiac monitoring. Labs sent.   Reviewed nursing notes and prior charts for additional history.   Labs reviewed/interpreted by me - wbc normal.   Additional labs reviewed/interpreted by me - NA very low/newly low, 119.  Iv ns boluses.   Hospitalists consulted for admission.      Final Clinical Impression(s) / ED Diagnoses Final diagnoses:  None    Rx / DC Orders ED Discharge Orders    None         Lajean Saver, MD 09/04/20 2103

## 2020-09-04 NOTE — ED Notes (Signed)
Date and time results received: 09/04/20 7:43 PM (use smartphrase ".now" to insert current time)  Test: sodium Critical Value: 119  Name of Provider Notified: [place]  Orders Received? Or Actions Taken?: Orders Received - See Orders for details

## 2020-09-04 NOTE — ED Triage Notes (Signed)
Pt arrived by [facility]. Pt called out for dry heaves, weakness & bilateral leg edema. Pt is a stage four metastaic lung ca patient who was given 6 months to live per EMS. Pt is not followed by hospice.

## 2020-09-04 NOTE — ED Notes (Signed)
Pt returned from CT °

## 2020-09-04 NOTE — H&P (Signed)
History and Physical    [name]:[id number] DOB: 1933/09/09 DOA: 09/04/2020  PCP: [name], MD   Patient coming from: Home  I have personally briefly reviewed patient's old medical records in [place]  Chief Complaint: Weakness, nausea  HPI: [name] is a 87 y.o. female with medical history significant for neuroendocrine tumor with metastasis to the liver, cerebellar degeneration, seizures, anxiety and depression, HTN. Patient presented to the ED with complaints of generalized weakness, dry heaves, and leg swelling.  At the time of my evaluation patient is confused and unable to give me history.  Patient spouse is at bedside.  He reports that patient's confusion started today, also with dry heaving.  Reports good oral intake up until today.  She has been weak over the past few days.  Reports small amount of loose stools over the past several weeks.  Has chronic lower abdominal pain.  She has also been having headaches.  Reports patient has not slept in at least 40 hours.  ED Course: Temperature 97.9.  Heart rate 60s.  Respiratory 20.  Blood pressure systolic 790-240.  O2 sats 94% on room air.  Sodium 119.  Potassium 3.5.  1.5 L bolus given.  Hospitalist admit for hyponatremia.  Review of Systems: Unable to ascertain due to altered mental status.  Past Medical History:  Diagnosis Date  . Acute respiratory failure with hypoxia ([place]) 10/23/2015  . Allergic rhinitis    PT. DENIES  . Anxiety   . Aortic insufficiency    Echo 04/29/2018: EF 65-70, mild AS (mean 13), mod AI, Asc Aorta 42 mm (mildly dilated), mild LAE, PASP 41, pericardium normal in appearance.   . Arthritis    NECK  . Ataxia   . Back pain 12/13/2016  . Bradycardia    primarily nocturnal  . Burning tongue syndrome 25 years  . Cataract   . Cerebellar degeneration ([place])   . Chest pain 12/13/2016   Atypical chest pain  . Chronic urinary tract infection   . Complication of anesthesia    low o2  sats, coded 30 years ago  . CVA (cerebral infarction) 05/2003  . Dehydration with hyponatremia 03/23/2018  . Depression   . DVT (deep venous thrombosis) ([place])   . Encounter for antineoplastic chemotherapy 10/09/2016  . Fatigue 01/03/2015  . Gait disorder   . Gastric polyps   . GERD (gastroesophageal reflux disease)   . Goals of care, counseling/discussion 10/09/2016  . H/O: CVA (cerebrovascular accident) 07/14/2016  . High cholesterol   . History of pericarditis   . Hyperlipidemia   . Hypotension   . Hypothyroidism   . IBS (irritable bowel syndrome)   . Liver cancer ([place]) dx'd 06/2016   liver  . Lung cancer ([place])   . Neuroendocrine cancer ([place]) 08/23/2016  . Obesity   . Paroxysmal atrial fibrillation ([name])    chads2vasc score is 6,  she is felt to be a poor candidate for anticoagulation  . Pericarditis   . Personal history of arterial venous malformation (AVM)    right side of face  . Pulmonary embolism ([place])   . Seizure disorder ([place])   . Seizures ([place]) 2003   " smelling"- Gabapentin "no problem"  . Shortness of breath dyspnea    with exertion  . Sick sinus syndrome ([place])   . Sternum fx 10/27/2013  . Stroke [facility]) 5 years ago   Right side of face weak, slurred speach-   . Thyroid disease   .  TIA (transient ischemic attack)   . UTI (lower urinary tract infection) 03/27/2016   "frequently"    Past Surgical History:  Procedure Laterality Date  . APPENDECTOMY  87 years old  . COLONOSCOPY  2006, 2009  . COLONOSCOPY WITH PROPOFOL N/A 03/28/2016   Procedure: COLONOSCOPY WITH PROPOFOL;  Surgeon: Gatha Mayer, MD;  Location: [facility];  Service: Endoscopy;  Laterality: N/A;  . CORONARY ANGIOGRAPHY N/A 05/22/2018   Procedure: CORONARY ANGIOGRAPHY (CATH LAB);  Surgeon: Lorretta Harp, MD;  Location: [place] CV LAB;  Service: Cardiovascular;  Laterality: N/A;  . cyst removed  35 years ago  . EP IMPLANTABLE DEVICE N/A 01/06/2015   Procedure: Loop Recorder Insertion;   Surgeon: Thompson Grayer, MD;  Location: [place] CV LAB;  Service: Cardiovascular;  Laterality: N/A;  . ESOPHAGOGASTRODUODENOSCOPY (EGD) WITH PROPOFOL N/A 11/07/2019   Procedure: ESOPHAGOGASTRODUODENOSCOPY (EGD) WITH PROPOFOL;  Surgeon: Yetta Flock, MD;  [facility];  Service: Gastroenterology;  Laterality: N/A;  . EYE SURGERY Right    Cataract  . HEMOSTASIS CLIP PLACEMENT  11/07/2019   Procedure: HEMOSTASIS CLIP PLACEMENT;  Surgeon: Yetta Flock, MD;  [facility];  Service: Gastroenterology;;  . HEMOSTASIS CONTROL  11/07/2019   Procedure: HEMOSTASIS CONTROL;  Surgeon: Yetta Flock, MD;  [facility];  Service: Gastroenterology;;  . Chrystine Oiler SELECTIVE EACH ADDITIONAL VESSEL  11/28/2016  . IR ANGIOGRAM SELECTIVE EACH ADDITIONAL VESSEL  11/28/2016  . IR ANGIOGRAM SELECTIVE EACH ADDITIONAL VESSEL  11/28/2016  . IR ANGIOGRAM SELECTIVE EACH ADDITIONAL VESSEL  11/28/2016  . IR ANGIOGRAM SELECTIVE EACH ADDITIONAL VESSEL  12/13/2016  . IR ANGIOGRAM SELECTIVE EACH ADDITIONAL VESSEL  12/13/2016  . IR ANGIOGRAM SELECTIVE EACH ADDITIONAL VESSEL  01/09/2017  . IR ANGIOGRAM VISCERAL SELECTIVE  11/28/2016  . IR ANGIOGRAM VISCERAL SELECTIVE  11/28/2016  . IR ANGIOGRAM VISCERAL SELECTIVE  12/13/2016  . IR ANGIOGRAM VISCERAL SELECTIVE  12/13/2016  . IR ANGIOGRAM VISCERAL SELECTIVE  01/09/2017  . IR EMBO ARTERIAL NOT HEMORR HEMANG INC GUIDE ROADMAPPING  11/28/2016  . IR EMBO TUMOR ORGAN ISCHEMIA INFARCT INC GUIDE ROADMAPPING  12/13/2016  . IR EMBO TUMOR ORGAN ISCHEMIA INFARCT INC GUIDE ROADMAPPING  01/09/2017  . IR IVC FILTER PLMT / S&I /IMG GUID/MOD SED  04/24/2017  . IR RADIOLOGIST EVAL & MGMT  11/06/2016  . IR RADIOLOGIST EVAL & MGMT  01/02/2017  . IR RADIOLOGIST EVAL & MGMT  02/05/2017  . IR RADIOLOGIST EVAL & MGMT  05/02/2017  . IR RADIOLOGIST EVAL & MGMT  10/17/2017  . IR US GUIDE VASC ACCESS RIGHT  11/28/2016  . IR US GUIDE VASC ACCESS RIGHT  12/13/2016  . IR US GUIDE  VASC ACCESS RIGHT  01/09/2017  . KNEE ARTHROSCOPY Right 11/14/2006  . KNEE ARTHROSCOPY Bilateral 5 and 6 years ago  . KNEE ARTHROSCOPY WITH LATERAL MENISECTOMY  07/03/2012   Procedure: KNEE ARTHROSCOPY WITH LATERAL MENISECTOMY;  Surgeon: Magnus Sinning, MD;  [facility];  Service: Orthopedics;  Laterality: Left;  with Partial Lateral Menisectomy and Medial Menisectomy. Shaving of medial and lateral femoral condyles. Shaving of patella. Removal of a loose body  . LOOP RECORDER REMOVAL N/A 05/22/2018   Procedure: LOOP RECORDER REMOVAL;  Surgeon: Thompson Grayer, MD;  Location: [place] CV LAB;  Service: Cardiovascular;  Laterality: N/A;  . PACEMAKER IMPLANT N/A 05/22/2018   MDT Azure XT DR MRI implanted by Dr Rayann Heman for sick sinus syndrome  . POLYPECTOMY  11/07/2019   Procedure: POLYPECTOMY;  Surgeon:  Armbruster, Carlota Raspberry, MD;  [facility];  Service: Gastroenterology;;  . tibial and fibular internal fixation Left   . TOTAL ABDOMINAL HYSTERECTOMY  87 years old  . UPPER GASTROINTESTINAL ENDOSCOPY  2009, 2013     reports that she quit smoking about 45 years ago. Her smoking use included cigarettes. She has a 2.50 pack-year smoking history. She has never used smokeless tobacco. She reports that she does not drink alcohol and does not use drugs.  Allergies  Allergen Reactions  . Levaquin [Levofloxacin] Shortness Of Breath  . Lisinopril Cough    Family History  Problem Relation Age of Onset  . Heart attack Father 40       fatal  . Peptic Ulcer Mother   . Coronary artery disease Brother   . Diabetes Brother   . Colon cancer Brother 79  . Colon polyps Brother   . Prostate cancer Brother   . Diabetes Brother   . Prostate cancer Son   . COPD Daughter     Prior to Admission medications   Medication Sig Start Date End Date Taking? Authorizing Provider  ALPRAZolam (XANAX) 0.25 MG tablet TAKE 1 TABLET BY MOUTH AT BEDTIME AS NEEDED FOR ANXIETY 05/11/20  Yes Curt Bears,  MD  amLODipine (NORVASC) 10 MG tablet Take 1 tablet (10 mg total) by mouth daily. 07/08/20  Yes Stacks, Cletus Gash, MD  escitalopram (LEXAPRO) 20 MG tablet TAKE 1 TABLET AT BEDTIME 06/15/20  Yes Stacks, Cletus Gash, MD  FeFum-FePoly-FA-B Cmp-C-Biot (INTEGRA PLUS) CAPS Take 1 capsule by mouth every morning. 08/11/20  Yes Curt Bears, MD  fluticasone [facility]) 50 MCG/ACT nasal spray USE 2 SPRAYS IN EACH NOSTRIL AT BEDTIME 06/06/20  Yes Stacks, Cletus Gash, MD  furosemide (LASIX) 20 MG tablet TAKE 1 TABLET DAILY AS NEEDED FOR FLUID RETENTION Patient taking differently: Take 20 mg by mouth daily as needed for fluid. 10/13/19  Yes Stacks, Cletus Gash, MD  gabapentin (NEURONTIN) 400 MG capsule 2 CAPSULES THREE TIMES A DAY 06/06/20  Yes [name], MD  HYDROcodone-acetaminophen (NORCO/VICODIN) 5-325 MG tablet Take 1 tablet by mouth every 6 (six) hours as needed for moderate pain. 07/08/20  Yes Stacks, Cletus Gash, MD  hydroxypropyl methylcellulose / hypromellose (ISOPTO TEARS / GONIOVISC) 2.5 % ophthalmic solution Place 1 drop into both eyes daily.    Yes [provider]  lovastatin (MEVACOR) 40 MG tablet TAKE 1 TABLET AT BEDTIME Patient taking differently: Take 40 mg by mouth at bedtime. 10/13/19  Yes Stacks, Cletus Gash, MD  Melatonin 3 MG TBDP Take 3-6 mg by mouth at bedtime as needed. Patient taking differently: Take 3-6 mg by mouth at bedtime as needed (sleep). 08/02/16  Yes Eustaquio Maize, MD  ondansetron (ZOFRAN) 8 MG tablet TAKE 1 TABLET EVERY 8 HOURS AS NEEDED FOR NAUSEA OR VOMITING 06/06/20  Yes [name], MD  OXcarbazepine (TRILEPTAL) 150 MG tablet Take 1 tablet in morning and 2 tablets at bedtime. 07/26/20  Yes Jaffe, Adam R, DO  pantoprazole (PROTONIX) 40 MG tablet TAKE 1 TABLET TWICE A DAY 08/23/20  Yes [name], MD  QUEtiapine (SEROQUEL) 300 MG tablet Take 1 tablet (300 mg total) by mouth at bedtime. 07/08/20  Yes Stacks, Cletus Gash, MD  silver sulfADIAZINE (SILVADENE) 1 % cream Apply 1 application  topically daily. 06/17/19  Yes [name], MD  SYNTHROID 75 MCG tablet TAKE 1 TABLET DAILY BEFORE BREAKFAST 11/19/19  Yes Stacks, Cletus Gash, MD  XARELTO 20 MG TABS tablet TAKE 1 TABLET DAILY WITH SUPPER 07/04/20  Yes [name], MD  Physical Exam: Vitals:   09/04/20 1829 09/04/20 1833 09/04/20 2042  BP:  (!) 217/77 (!) 202/69  Pulse:  60 65  Resp:  20 20  Temp:  97.9 F (36.6 C) 98.1 F (36.7 C)  TempSrc:  Oral Oral  SpO2:  94% 94%  Weight: 90.9 kg    Height: 5\' 4"  (1.626 m)      Constitutional: NAD, calm, comfortable Vitals:   09/04/20 1829 09/04/20 1833 09/04/20 2042  BP:  (!) 217/77 (!) 202/69  Pulse:  60 65  Resp:  20 20  Temp:  97.9 F (36.6 C) 98.1 F (36.7 C)  TempSrc:  Oral Oral  SpO2:  94% 94%  Weight: 90.9 kg    Height: 5\' 4"  (1.626 m)     Eyes: PERRL, lids and conjunctivae normal ENMT: Mucous membranes are dry.   Neck: normal, supple, no masses, no thyromegaly Respiratory: clear to auscultation bilaterally, no wheezing, no crackles. Normal respiratory effort. No accessory muscle use.  Cardiovascular: Regular rate and rhythm, No extremity edema. 2+ pedal pulses.  Abdomen: No abdominal tenderness, no masses palpated. No hepatosplenomegaly. Bowel sounds positive.  Musculoskeletal: no clubbing / cyanosis. No joint deformity upper and lower extremities. Good ROM, no contractures. Normal muscle tone.  Skin: no rashes, lesions, ulcers. No induration Neurologic: CN 2-12 grossly intact. Sensation intact, DTR normal. Strength 5/5 in all 4.  Psychiatric: Normal judgment and insight. Alert and oriented x 3. Normal mood.   Labs on Admission: I have personally reviewed following labs and imaging studies  CBC: Recent Labs  Lab 09/04/20 1847  WBC 7.3  HGB 14.5  HCT 41.8  MCV 92.9  PLT 778   Basic Metabolic Panel: Recent Labs  Lab 09/04/20 1847  NA 119*  K 3.5  CL 90*  CO2 18*  GLUCOSE 144*  BUN 11  CREATININE 0.60  CALCIUM 9.3   GFR: Estimated  Creatinine Clearance: 57.2 mL/min (by C-G formula based on SCr of 0.6 mg/dL). Liver Function Tests: Recent Labs  Lab 09/04/20 1847  AST 36  ALT 21  ALKPHOS 82  BILITOT 1.1  PROT 7.3  ALBUMIN 4.1    Radiological Exams on Admission: No results found.  EKG: Independently reviewed.  Sinus rhythm rate 63.  QTc 472.  No significant change from prior.  Assessment/Plan Principal Problem:   Hyponatremia Active Problems:   Essential hypertension   Cerebellar degeneration ([name])   Depression   Paroxysmal atrial fibrillation ([name])   Neuroendocrine carcinoma metastatic to liver [facility])   Sick sinus syndrome ([name])  Hyponatremia-sodium 119, 3 weeks ago sodium was 135.  Need to rule out SIADH, as she has a lung malignancy, need to rule out metastasis to brain.  She takes lasix very occasionally. -1.5 L bolus given, continue N/s 100cc/hr - BMP Q4hr - Goal correction of about 8 in 24 hours. - Serum osmolality - Urine sodium and osmolality - TSH -Check a.m. cortisol  Metabolic encephalopathy-likely secondary to hyponatremia. -Follow-up UA, Covid test. -Obtain head CT -N.p.o. -Ativan 1mg  x1 for sleep.  Neuroendocrine cancer of lung primary, metastatic to liver- follows with Dr. Earlie Server.  Status post 43 cycles of chemotherapy.  Currently treatment on hold due to intolerance.  Uncontrolled hypertension- Systolic up to 242 -IV hydralazine 10 mg as needed for heart rate greater than 180. -Hold Norvasc for now with altered mental status.  Paroxysmal atrial fibrillation, sick sinus syndrome- s/p permanent transvenous pacemaker. Follows with Dr. Gwenlyn Found. -Hold Xarelto for now, resume pending improvement in mental status.  Depression and anxiety -Hold Seroquel for now.   DVT prophylaxis: Lovenox, resume home Xarelto when able.  Code Status: DNR.  Confirmed with patient spouse at bedside. Family Communication: Spouse at bedside.  Primary decision-maker. Disposition Plan: ~ 2 days Consults  called: None Admission status: Inpt, tele I certify that at the point of admission it is my clinical judgment that the patient will require inpatient hospital care spanning beyond 2 midnights from the point of admission due to high intensity of service, high risk for further deterioration and high frequency of surveillance required.     Bethena Roys MD [facility]  09/04/2020, 9:16 PM

## 2020-09-04 NOTE — ED Notes (Signed)
Pt transported to CT ?

## 2020-09-05 DIAGNOSIS — I10 Essential (primary) hypertension: Secondary | ICD-10-CM | POA: Diagnosis not present

## 2020-09-05 DIAGNOSIS — G319 Degenerative disease of nervous system, unspecified: Secondary | ICD-10-CM | POA: Diagnosis not present

## 2020-09-05 DIAGNOSIS — C7A8 Other malignant neuroendocrine tumors: Secondary | ICD-10-CM | POA: Diagnosis not present

## 2020-09-05 DIAGNOSIS — I480 Paroxysmal atrial fibrillation: Secondary | ICD-10-CM

## 2020-09-05 DIAGNOSIS — Z515 Encounter for palliative care: Secondary | ICD-10-CM

## 2020-09-05 DIAGNOSIS — C7B8 Other secondary neuroendocrine tumors: Secondary | ICD-10-CM

## 2020-09-05 DIAGNOSIS — E871 Hypo-osmolality and hyponatremia: Secondary | ICD-10-CM | POA: Diagnosis not present

## 2020-09-05 DIAGNOSIS — I495 Sick sinus syndrome: Secondary | ICD-10-CM

## 2020-09-05 LAB — BASIC METABOLIC PANEL
Anion gap: 6 (ref 5–15)
Anion gap: 9 (ref 5–15)
BUN: 13 mg/dL (ref 8–23)
BUN: 15 mg/dL (ref 8–23)
CO2: 21 mmol/L — ABNORMAL LOW (ref 22–32)
CO2: 17 mmol/L — ABNORMAL LOW (ref 22–32)
Calcium: 9.1 mg/dL (ref 8.9–10.3)
Calcium: 9 mg/dL (ref 8.9–10.3)
Chloride: 99 mmol/L (ref 98–111)
Chloride: 100 mmol/L (ref 98–111)
Creatinine, Ser: 0.67 mg/dL (ref 0.44–1.00)
Creatinine, Ser: 0.63 mg/dL (ref 0.44–1.00)
GFR, Estimated: >60 mL/min (ref >60)
GFR, Estimated: >60 mL/min (ref >60)
Glucose, Bld: 128 mg/dL — ABNORMAL HIGH (ref 70–99)
Glucose, Bld: 113 mg/dL — ABNORMAL HIGH (ref 70–99)
Potassium: 4.1 mmol/L (ref 3.5–5.1)
Potassium: 4.3 mmol/L (ref 3.5–5.1)
Sodium: 126 mmol/L — ABNORMAL LOW (ref 135–145)
Sodium: 126 mmol/L — ABNORMAL LOW (ref 135–145)

## 2020-09-05 LAB — URINALYSIS, ROUTINE W REFLEX MICROSCOPIC
Bilirubin Urine: NEGATIVE
Glucose, UA: NEGATIVE mg/dL
Hgb urine dipstick: NEGATIVE
Ketones, ur: 20 mg/dL — AB
Leukocytes,Ua: NEGATIVE
Nitrite: NEGATIVE
Protein, ur: >=300 mg/dL — AB
Specific Gravity, Urine: 1.031 — ABNORMAL HIGH (ref 1.005–1.030)
pH: 6 (ref 5.0–8.0)

## 2020-09-05 LAB — OSMOLALITY: Osmolality: 270 mOsm/kg — ABNORMAL LOW (ref 275–295)

## 2020-09-05 LAB — SODIUM, URINE, RANDOM: Sodium, Ur: 113 mmol/L

## 2020-09-05 LAB — SARS CORONAVIRUS 2 (TAT 6-24 HRS): SARS Coronavirus 2: NEGATIVE

## 2020-09-05 LAB — OSMOLALITY, URINE: Osmolality, Ur: 582 mOsm/kg (ref 300–900)

## 2020-09-05 MED ORDER — OXCARBAZEPINE 150 MG PO TABS
150.0000 mg | ORAL_TABLET | Freq: Every day | ORAL | Status: DC
  Administered 2020-09-06: 150 mg via ORAL
  Filled 2020-09-05 (×2): qty 1

## 2020-09-05 MED ORDER — QUETIAPINE FUMARATE 100 MG PO TABS
300.0000 mg | ORAL_TABLET | Freq: Every day | ORAL | Status: DC
  Administered 2020-09-05: 300 mg via ORAL
  Filled 2020-09-05: qty 3

## 2020-09-05 MED ORDER — SODIUM CHLORIDE 0.9 % IV SOLN: INTRAVENOUS | Status: DC

## 2020-09-05 MED ORDER — LEVOTHYROXINE SODIUM 75 MCG PO TABS: 75.0000 ug | ORAL_TABLET | Freq: Every day | ORAL | Status: DC

## 2020-09-05 MED ORDER — AMLODIPINE BESYLATE 5 MG PO TABS
10.0000 mg | ORAL_TABLET | Freq: Every day | ORAL | Status: DC
  Administered 2020-09-05 – 2020-09-06 (×2): 10 mg via ORAL
  Filled 2020-09-05 (×2): qty 2

## 2020-09-05 MED ORDER — PRAVASTATIN SODIUM 40 MG PO TABS
40.0000 mg | ORAL_TABLET | Freq: Every day | ORAL | Status: DC
  Administered 2020-09-05: 40 mg via ORAL
  Filled 2020-09-05: qty 1

## 2020-09-05 MED ORDER — OXCARBAZEPINE 300 MG PO TABS
300.0000 mg | ORAL_TABLET | Freq: Every day | ORAL | Status: DC
  Administered 2020-09-05: 300 mg via ORAL
  Filled 2020-09-05: qty 1

## 2020-09-05 MED ORDER — RIVAROXABAN 20 MG PO TABS
20.0000 mg | ORAL_TABLET | Freq: Every day | ORAL | Status: DC
  Administered 2020-09-05: 20 mg via ORAL
  Filled 2020-09-05: qty 1

## 2020-09-05 MED ORDER — HYDROMORPHONE HCL 1 MG/ML IJ SOLN
0.2500 mg | INTRAMUSCULAR | Status: DC | PRN
Start: 2020-09-05 — End: 2020-09-07
  Administered 2020-09-05 – 2020-09-07 (×11): 0.5 mg via INTRAVENOUS
  Filled 2020-09-05 (×11): qty 0.5

## 2020-09-05 MED ORDER — POLYVINYL ALCOHOL 1.4 % OP SOLN
1.0000 [drp] | Freq: Every day | OPHTHALMIC | Status: DC
  Administered 2020-09-06 – 2020-09-07 (×2): 1 [drp] via OPHTHALMIC
  Filled 2020-09-05: qty 15

## 2020-09-05 MED ORDER — HYDROCODONE-ACETAMINOPHEN 5-325 MG PO TABS
1.0000 | ORAL_TABLET | Freq: Once | ORAL | Status: DC
  Filled 2020-09-05 (×2): qty 1

## 2020-09-05 MED ORDER — PANTOPRAZOLE SODIUM 40 MG PO TBEC
40.0000 mg | DELAYED_RELEASE_TABLET | Freq: Two times a day (BID) | ORAL | Status: DC
  Administered 2020-09-05 – 2020-09-06 (×2): 40 mg via ORAL
  Filled 2020-09-05 (×2): qty 1

## 2020-09-05 MED ORDER — ESCITALOPRAM OXALATE 10 MG PO TABS
20.0000 mg | ORAL_TABLET | Freq: Every day | ORAL | Status: DC
  Administered 2020-09-05: 20 mg via ORAL
  Filled 2020-09-05: qty 2

## 2020-09-05 NOTE — Progress Notes (Signed)
Na trended up by 7. Holding NaCl for the rest of tonight.

## 2020-09-05 NOTE — Progress Notes (Signed)
PROGRESS NOTE   [name]  GMW:[id number] DOB: [birth year] DOA: 09/04/2020 PCP: [name], MD   Chief Complaint  Patient presents with  . Leg Swelling  . Nausea   Level of care: Telemetry  Brief Admission History:  87 y.o. female with medical history significant for neuroendocrine tumor with metastasis to the liver, cerebellar degeneration, seizures, anxiety and depression, HTN.  Patient presented to the ED with complaints of generalized weakness, dry heaves, and leg swelling.  At the time of my evaluation patient is confused and unable to give me history.  Patient spouse is at bedside.  He reports that patient's confusion started today, also with dry heaving.  Reports good oral intake up until today.  She has been weak over the past few days.  Reports small amount of loose stools over the past several weeks.  Has chronic lower abdominal pain.  She has also been having headaches.  Reports patient has not slept in at least 40 hours.   Assessment & Plan:   Principal Problem:   Hyponatremia Active Problems:   Essential hypertension   Cerebellar degeneration ([name])   Depression   Paroxysmal atrial fibrillation ([name])   Neuroendocrine carcinoma metastatic to liver [facility])   Sick sinus syndrome ([place])  1. Metabolic encephalopathy - Pt continues to be confused and likely this is a result of acute hyponatremia that is slowly correcting now and will continue to follow and correct.   2. Metabolic acidosis 3. Hyponatremia - improving with hydration.  Follow closely.   4. Metastatic neuroendocrine tumor - Pt no longer able to tolerate chemotherapy.  Palliative care consultation requested.  5. Uncontrolled hypertension - resume home and IV prn meds ordered as needed.  6. PAF - s/p pacemaker, resume home xarelto.  7. Depression/anxiety - resume home medication.    DVT prophylaxis: xarelto  Code Status: DNR  Family Communication: t/c to spouse  Disposition: TBD Status is:  Inpatient  Remains inpatient appropriate because:IV treatments appropriate due to intensity of illness or inability to take PO and Inpatient level of care appropriate due to severity of illness   Dispo: The patient is from: Home              Anticipated d/c is to: TBD              Anticipated d/c date is: 1 day              Patient currently is not medically stable to d/c.   Difficult to place patient No   Consultants:     Procedures:     Antimicrobials:     Subjective: Pt lying in bed screaming out in pain.  IV pain meds ordered.   Objective: Vitals:   09/05/20 0247 09/05/20 0300 09/05/20 0513 09/05/20 0956  BP: (!) 156/50 (!) 144/54 (!) 163/38 (!) 176/55  Pulse: 71 65 62 65  Resp: 19 (!) 22 (!) 24 18  Temp:   98.4 F (36.9 C) 98.1 F (36.7 C)  TempSrc:   Oral Oral  SpO2: 95% 93% 95% 95%  Weight:      Height:        Intake/Output Summary (Last 24 hours) at 09/05/2020 1400 Last data filed at 09/05/2020 1245 Gross per 24 hour  Intake 60 ml  Output --  Net 60 ml   Filed Weights   09/04/20 1829  Weight: 90.9 kg    Examination:  General exam: frail elderly female, appears distressed.  Respiratory system: Clear to auscultation.  Respiratory effort normal. Cardiovascular system: normal S1 & S2 heard. No JVD, murmurs, rubs, gallops or clicks. No pedal edema. Gastrointestinal system: Abdomen is nondistended, soft and nontender. No organomegaly or masses felt. Normal bowel sounds heard. Central nervous system: Alert and oriented to person. No focal neurological deficits. Extremities: Symmetric 5 x 5 power. Skin: No rashes, lesions or ulcers Psychiatry: Judgement and insight appear poor. Mood & affect appropriate.   Data Reviewed: I have personally reviewed following labs and imaging studies  CBC: Recent Labs  Lab 09/04/20 1847  WBC 7.3  HGB 14.5  HCT 41.8  MCV 92.9  PLT 354    Basic Metabolic Panel: Recent Labs  Lab 09/04/20 1847 09/05/20 0115  09/05/20 0641  NA 119* 126* 126*  K 3.5 4.1 4.3  CL 90* 99 100  CO2 18* 21* 17*  GLUCOSE 144* 128* 113*  BUN 11 13 15   CREATININE 0.60 0.67 0.63  CALCIUM 9.3 9.1 9.0  MG 1.8  --   --     GFR: Estimated Creatinine Clearance: 57.2 mL/min (by C-G formula based on SCr of 0.63 mg/dL).  Liver Function Tests: Recent Labs  Lab 09/04/20 1847  AST 36  ALT 21  ALKPHOS 82  BILITOT 1.1  PROT 7.3  ALBUMIN 4.1    CBG: No results for input(s): GLUCAP in the last 168 hours.  No results found for this or any previous visit (from the past 240 hour(s)).   Radiology Studies: CT HEAD WO CONTRAST  Result Date: 09/04/2020 CLINICAL DATA:  Altered level of consciousness, stage IV metastatic lung cancer EXAM: CT HEAD WITHOUT CONTRAST TECHNIQUE: Contiguous axial images were obtained from the base of the skull through the vertex without intravenous contrast. COMPARISON:  07/19/2020 FINDINGS: Brain: Chronic ischemic changes are seen throughout the periventricular white matter and cerebellum. No evidence of acute infarct or hemorrhage. Lateral ventricles and midline structures are stable. No acute extra-axial fluid collections. No mass effect. Stable meningioma along the right tentorium, measuring up to 1.4 cm. Vascular: No hyperdense vessel or unexpected calcification. Skull: Normal. Negative for fracture or focal lesion. Sinuses/Orbits: No acute finding. Other: None. IMPRESSION: 1. Stable chronic ischemic changes.  No acute intracranial process. 2. Stable right tentorial meningioma. Electronically Signed   By: Randa Ngo M.D.   On: 09/04/2020 22:35   Scheduled Meds: . enoxaparin (LOVENOX) injection  40 mg Subcutaneous QHS  . HYDROcodone-acetaminophen  1 tablet Oral Once   Continuous Infusions: . sodium chloride       LOS: 1 day   Time spent: 35 minutes   Kirke Breach Wynetta Emery, MD How to contact the [facility] Attending or Consulting provider [place] or covering provider during after hours [place], for  this patient?  1. Check the care team in [facility] and look for a) attending/consulting [facility] provider listed and b) the [facility] team listed 2. Log into [email or web address] and use [facility]'s universal password to access. If you do not have the password, please contact the hospital operator. 3. Locate the [facility] provider you are looking for under [facility] and page to a number that you can be directly reached. 4. If you still have difficulty reaching the provider, please page the [facility] (Director on Call) for the Hospitalists listed on amion for assistance.  09/05/2020, 2:00 PM

## 2020-09-05 NOTE — Plan of Care (Signed)
Palliative:   Thank you for this consult.  Unfortunately due to high volume of consults there will be a delay in a Palliative Provider seeing this patient. Palliative Medicine will return to service on 09/06/2020 and will see patient at that time.  No charge  Quinn Axe, NP Palliative Medicine Please call Palliative Medicine team phone with any questions [phone]. For individual providers please see AMION.

## 2020-09-05 NOTE — TOC Initial Note (Signed)
Transition of Care [facility]) - Initial/Assessment Note   Patient Details  Name: [name] MRN: [id number] Date of Birth: 10-29-1933  Transition of Care [facility]) CM/SW Contact:    Sherie Don, LCSW Phone Number: 09/05/2020, 12:19 PM  Clinical Narrative: Patient is an 87 year old female who was admitted for hyponatremia. Patient has a history hypertension, cerebellar degeneration, depression, paroxysmal atrial fibrillation, neuroendocrine carcinoma metastatic to liver, and sick sinus syndrome. Readmission checklist completed due to high readmission score.  CSW unable to reach patient, so assessment was completed with patient's husband, [name]. Per husband, patient resides at home with him. Patient's current DME includes a wheelchair and a [place] lift as patient is wheelchair-bound at baseline. Patient is not currently active with [facility] services and requires extensive assistance with ADLs. Husband reported the patient is able to afford her medications monthly, which she takes as prescribed. Husband provides transportation to appointments using their lift Lucianne Lei that accomodates her wheelchair. TOC to follow.   Expected Discharge Plan: Home/Self Care Barriers to Discharge: Continued Medical Work up  Patient Goals and CMS Choice Patient states their goals for this hospitalization and ongoing recovery are:: Return home CMS Medicare.gov Compare Post Acute Care list provided to:: Patient Represenative (must comment) Choice offered to / list presented to : Spouse  Expected Discharge Plan and Services Expected Discharge Plan: Home/Self Care In-house Referral: Clinical Social Work Discharge Planning Services: NA Post Acute Care Choice: NA Living arrangements for the past 2 months: Single Family Home              DME Arranged: N/A DME Agency: NA HH Arranged: NA [place] Agency: NA  Prior Living Arrangements/Services Living arrangements for the past 2 months: [place] Lives with::  Spouse Patient language and need for interpreter reviewed:: Yes Do you feel safe going back to the place where you live?: Yes      Need for Family Participation in Patient Care: Yes (Comment) Care giver support system in place?: Yes (comment) Current home services: DME Administrator, Civil Service, [employer] fast streamer) Criminal Activity/Legal Involvement Pertinent to Current Situation/Hospitalization: No - Comment as needed  Activities of Daily Living Home Assistive Devices/Equipment: Wheelchair ADL Screening (condition at time of admission) Patient's cognitive ability adequate to safely complete daily activities?: No Is the patient deaf or have difficulty hearing?: Yes Does the patient have difficulty seeing, even when wearing glasses/contacts?: No Does the patient have difficulty concentrating, remembering, or making decisions?: Yes Patient able to express need for assistance with ADLs?: Yes Does the patient have difficulty dressing or bathing?: Yes Independently performs ADLs?: No Communication: Dependent,Needs assistance Is this a change from baseline?: Pre-admission baseline Dressing (OT): Dependent Is this a change from baseline?: Pre-admission baseline Grooming: Dependent Is this a change from baseline?: Pre-admission baseline Feeding: Dependent Is this a change from baseline?: Pre-admission baseline Bathing: Dependent Is this a change from baseline?: Pre-admission baseline Toileting: Dependent Is this a change from baseline?: Pre-admission baseline In/Out Bed: Dependent Is this a change from baseline?: Pre-admission baseline Walks in Home: Dependent Is this a change from baseline?: Pre-admission baseline Does the patient have difficulty walking or climbing stairs?: Yes Weakness of Legs: None Weakness of Arms/Hands: None  Emotional Assessment Appearance:: Appears stated age Orientation: : Oriented to Self,Oriented to Situation Alcohol / Substance Use: Not Applicable Psych Involvement: No  (comment)  Admission diagnosis:  Hyponatremia [E87.1] Peripheral edema [R60.9] Uncontrolled hypertension [I10] Generalized weakness [R53.1] Primary malignant neoplasm of lung metastatic to other site, unspecified laterality ([place]) [C34.90]  Patient Active Problem List   Diagnosis Date Noted  . Hyponatremia 09/04/2020  . Vertebral artery insufficiency 07/20/2020  . Acute blood loss anemia 11/07/2019  . Anemia in other chronic diseases classified elsewhere 11/07/2019  . Upper GI bleed 11/06/2019  . Anticoagulated   . Port-A-Cath in place 08/28/2019  . Encounter for antineoplastic chemotherapy 07/07/2019  . Skin breakdown 06/04/2018  . Candida infection 06/04/2018  . Sick sinus syndrome ([place]) 05/22/2018  . Mild aortic stenosis 05/21/2018  . Primary malignant neoplasm of lung metastatic to other site [facility])   . Macrocytic anemia   . Thrombocytopenia ([place]) 03/23/2018  . Osteoarthritis of knee 01/10/2018  . Pulmonary embolism ([place]) 04/24/2017  . Neuroendocrine carcinoma metastatic to liver ([place]) 12/13/2016  . Liver metastases ([place]) 08/23/2016  . Late effect of cerebrovascular accident (CVA) 08/02/2016  . Meningioma ([place]) 07/14/2016  . Asthmatic bronchitis 10/23/2015  . GAD (generalized anxiety disorder) 09/30/2015  . Peripheral edema 09/30/2015  . Insomnia 09/30/2015  . Localization-related partial epilepsy with simple partial seizures ([place]) 06/27/2015  . Sinus bradycardia 01/03/2015  . Paroxysmal atrial fibrillation ([place]) 01/03/2015  . Morbid obesity ([place]) 01/03/2015  . Solitary pulmonary nodule 06/02/2014  . Thyroid nodule 05/08/2014  . Depression   . Seizure disorder ([place])   . Hypothyroidism   . Cerebellar degeneration ([place])   . Irritable bowel syndrome 02/18/2008  . Hyperlipemia 09/10/2006  . Essential hypertension 09/10/2006  . GERD (gastroesophageal reflux disease) 09/10/2006   PCP:  [name], MD Pharmacy:   [facility] [address]  [place] [address] Phone: [phone] Fax: [phone]  [facility] [place], [place] [address] [place] [address] Phone: [phone] Fax: [place], [place] [address] Phone: [phone] Fax: [phone]  Readmission Risk Interventions Readmission Risk Prevention Plan 09/05/2020  Transportation Screening Complete  Home Care Screening Complete  Medication Review (RN CM) Complete  Some recent data might be hidden

## 2020-09-06 ENCOUNTER — Encounter (HOSPITAL_COMMUNITY): Payer: Self-pay | Admitting: Internal Medicine

## 2020-09-06 DIAGNOSIS — R531 Weakness: Secondary | ICD-10-CM

## 2020-09-06 DIAGNOSIS — C7A8 Other malignant neuroendocrine tumors: Secondary | ICD-10-CM | POA: Diagnosis not present

## 2020-09-06 DIAGNOSIS — Z515 Encounter for palliative care: Secondary | ICD-10-CM | POA: Diagnosis not present

## 2020-09-06 DIAGNOSIS — Z7189 Other specified counseling: Secondary | ICD-10-CM | POA: Diagnosis not present

## 2020-09-06 DIAGNOSIS — E871 Hypo-osmolality and hyponatremia: Secondary | ICD-10-CM | POA: Diagnosis not present

## 2020-09-06 DIAGNOSIS — I10 Essential (primary) hypertension: Secondary | ICD-10-CM | POA: Diagnosis not present

## 2020-09-06 LAB — BASIC METABOLIC PANEL
Anion gap: 7 (ref 5–15)
BUN: 22 mg/dL (ref 8–23)
CO2: 21 mmol/L — ABNORMAL LOW (ref 22–32)
Calcium: 9.2 mg/dL (ref 8.9–10.3)
Chloride: 102 mmol/L (ref 98–111)
Creatinine, Ser: 0.69 mg/dL (ref 0.44–1.00)
GFR, Estimated: >60 mL/min (ref >60)
Glucose, Bld: 120 mg/dL — ABNORMAL HIGH (ref 70–99)
Potassium: 4 mmol/L (ref 3.5–5.1)
Sodium: 130 mmol/L — ABNORMAL LOW (ref 135–145)

## 2020-09-06 MED ORDER — ACETAMINOPHEN 650 MG RE SUPP: 650.0000 mg | Freq: Four times a day (QID) | RECTAL | Status: DC | PRN

## 2020-09-06 MED ORDER — GLYCOPYRROLATE 1 MG PO TABS: 1.0000 mg | ORAL_TABLET | ORAL | Status: DC | PRN

## 2020-09-06 MED ORDER — GLYCOPYRROLATE 0.2 MG/ML IJ SOLN: 0.2000 mg | INTRAMUSCULAR | Status: DC | PRN

## 2020-09-06 MED ORDER — BIOTENE DRY MOUTH MT LIQD: 15.0000 mL | OROMUCOSAL | Status: DC | PRN

## 2020-09-06 MED ORDER — HALOPERIDOL LACTATE 2 MG/ML PO CONC: 0.5000 mg | ORAL | Status: DC | PRN

## 2020-09-06 MED ORDER — LORAZEPAM 2 MG/ML PO CONC: 1.0000 mg | ORAL | Status: DC | PRN

## 2020-09-06 MED ORDER — LORAZEPAM 2 MG/ML IJ SOLN
1.0000 mg | INTRAMUSCULAR | Status: DC | PRN
  Administered 2020-09-07: 1 mg via INTRAVENOUS
  Filled 2020-09-06: qty 1

## 2020-09-06 MED ORDER — CHLORHEXIDINE GLUCONATE CLOTH 2 % EX PADS
6.0000 | MEDICATED_PAD | Freq: Every day | CUTANEOUS | Status: DC
  Administered 2020-09-06: 6 via TOPICAL

## 2020-09-06 MED ORDER — ONDANSETRON HCL 4 MG/2ML IJ SOLN: 4.0000 mg | Freq: Four times a day (QID) | INTRAMUSCULAR | Status: DC | PRN

## 2020-09-06 MED ORDER — SODIUM CHLORIDE 0.9 % IV SOLN
1.0000 g | INTRAVENOUS | Status: DC
  Administered 2020-09-06: 1 g via INTRAVENOUS
  Filled 2020-09-06: qty 10

## 2020-09-06 MED ORDER — ONDANSETRON 4 MG PO TBDP: 4.0000 mg | ORAL_TABLET | Freq: Four times a day (QID) | ORAL | Status: DC | PRN

## 2020-09-06 MED ORDER — ACETAMINOPHEN 325 MG PO TABS: 650.0000 mg | ORAL_TABLET | Freq: Four times a day (QID) | ORAL | Status: DC | PRN

## 2020-09-06 MED ORDER — POLYVINYL ALCOHOL 1.4 % OP SOLN: 1.0000 [drp] | Freq: Four times a day (QID) | OPHTHALMIC | Status: DC | PRN

## 2020-09-06 MED ORDER — HALOPERIDOL LACTATE 5 MG/ML IJ SOLN: 0.5000 mg | INTRAMUSCULAR | Status: DC | PRN

## 2020-09-06 MED ORDER — LORAZEPAM 1 MG PO TABS: 1.0000 mg | ORAL_TABLET | ORAL | Status: DC | PRN

## 2020-09-06 MED ORDER — HALOPERIDOL 0.5 MG PO TABS: 0.5000 mg | ORAL_TABLET | ORAL | Status: DC | PRN

## 2020-09-06 MED ORDER — LORAZEPAM 2 MG/ML IJ SOLN
1.0000 mg | Freq: Three times a day (TID) | INTRAMUSCULAR | Status: DC
  Administered 2020-09-06 – 2020-09-07 (×3): 1 mg via INTRAVENOUS
  Filled 2020-09-06 (×3): qty 1

## 2020-09-06 NOTE — Evaluation (Signed)
Clinical/Bedside Swallow Evaluation Patient Details  Name: [name] MRN: [id number] Date of Birth: 1934-03-30  Today's Date: 09/06/2020 Time: SLP Start Time (ACUTE ONLY): 58 SLP Stop Time (ACUTE ONLY): 1200 SLP Time Calculation (min) (ACUTE ONLY): 30 min  Past Medical History:  Past Medical History:  Diagnosis Date  . Acute respiratory failure with hypoxia ([place]) 10/23/2015  . Allergic rhinitis    PT. DENIES  . Anxiety   . Aortic insufficiency    Echo 04/29/2018: EF 65-70, mild AS (mean 13), mod AI, Asc Aorta 42 mm (mildly dilated), mild LAE, PASP 41, pericardium normal in appearance.   . Arthritis    NECK  . Ataxia   . Back pain 12/13/2016  . Bradycardia    primarily nocturnal  . Burning tongue syndrome 25 years  . Cataract   . Cerebellar degeneration ([place])   . Chest pain 12/13/2016   Atypical chest pain  . Chronic urinary tract infection   . Complication of anesthesia    low o2 sats, coded 30 years ago  . CVA (cerebral infarction) 05/2003  . Dehydration with hyponatremia 03/23/2018  . Depression   . DVT (deep venous thrombosis) ([place])   . Encounter for antineoplastic chemotherapy 10/09/2016  . Fatigue 01/03/2015  . Gait disorder   . Gastric polyps   . GERD (gastroesophageal reflux disease)   . Goals of care, counseling/discussion 10/09/2016  . H/O: CVA (cerebrovascular accident) 07/14/2016  . High cholesterol   . History of pericarditis   . Hyperlipidemia   . Hypotension   . Hypothyroidism   . IBS (irritable bowel syndrome)   . Liver cancer ([place]) dx'd 06/2016   liver  . Lung cancer ([place])   . Neuroendocrine cancer ([place]) 08/23/2016  . Obesity   . Paroxysmal atrial fibrillation ([name])    chads2vasc score is 6,  she is felt to be a poor candidate for anticoagulation  . Pericarditis   . Personal history of arterial venous malformation (AVM)    right side of face  . Pulmonary embolism ([place])   . Seizure disorder ([place])   . Seizures ([place]) 2003   " smelling"- Gabapentin  "no problem"  . Shortness of breath dyspnea    with exertion  . Sick sinus syndrome ([place])   . Sternum fx 10/27/2013  . Stroke [facility]) 5 years ago   Right side of face weak, slurred speach-   . Thyroid disease   . TIA (transient ischemic attack)   . UTI (lower urinary tract infection) 03/27/2016   "frequently"   Past Surgical History:  Past Surgical History:  Procedure Laterality Date  . APPENDECTOMY  87 years old  . COLONOSCOPY  2006, 2009  . COLONOSCOPY WITH PROPOFOL N/A 03/28/2016   Procedure: COLONOSCOPY WITH PROPOFOL;  Surgeon: Gatha Mayer, MD;  Location: [facility];  Service: Endoscopy;  Laterality: N/A;  . CORONARY ANGIOGRAPHY N/A 05/22/2018   Procedure: CORONARY ANGIOGRAPHY (CATH LAB);  Surgeon: Lorretta Harp, MD;  Location: [place] CV LAB;  Service: Cardiovascular;  Laterality: N/A;  . cyst removed  35 years ago  . EP IMPLANTABLE DEVICE N/A 01/06/2015   Procedure: Loop Recorder Insertion;  Surgeon: Thompson Grayer, MD;  Location: [place] CV LAB;  Service: Cardiovascular;  Laterality: N/A;  . ESOPHAGOGASTRODUODENOSCOPY (EGD) WITH PROPOFOL N/A 11/07/2019   Procedure: ESOPHAGOGASTRODUODENOSCOPY (EGD) WITH PROPOFOL;  Surgeon: Yetta Flock, MD;  [facility];  Service: Gastroenterology;  Laterality: N/A;  . EYE SURGERY Right    Cataract  . HEMOSTASIS  CLIP PLACEMENT  11/07/2019   Procedure: HEMOSTASIS CLIP PLACEMENT;  Surgeon: Yetta Flock, MD;  [facility];  Service: Gastroenterology;;  . HEMOSTASIS CONTROL  11/07/2019   Procedure: HEMOSTASIS CONTROL;  Surgeon: Yetta Flock, MD;  [facility];  Service: Gastroenterology;;  . Chrystine Oiler SELECTIVE EACH ADDITIONAL VESSEL  11/28/2016  . IR ANGIOGRAM SELECTIVE EACH ADDITIONAL VESSEL  11/28/2016  . IR ANGIOGRAM SELECTIVE EACH ADDITIONAL VESSEL  11/28/2016  . IR ANGIOGRAM SELECTIVE EACH ADDITIONAL VESSEL  11/28/2016  . IR ANGIOGRAM SELECTIVE EACH ADDITIONAL VESSEL  12/13/2016  . IR  ANGIOGRAM SELECTIVE EACH ADDITIONAL VESSEL  12/13/2016  . IR ANGIOGRAM SELECTIVE EACH ADDITIONAL VESSEL  01/09/2017  . IR ANGIOGRAM VISCERAL SELECTIVE  11/28/2016  . IR ANGIOGRAM VISCERAL SELECTIVE  11/28/2016  . IR ANGIOGRAM VISCERAL SELECTIVE  12/13/2016  . IR ANGIOGRAM VISCERAL SELECTIVE  12/13/2016  . IR ANGIOGRAM VISCERAL SELECTIVE  01/09/2017  . IR EMBO ARTERIAL NOT HEMORR HEMANG INC GUIDE ROADMAPPING  11/28/2016  . IR EMBO TUMOR ORGAN ISCHEMIA INFARCT INC GUIDE ROADMAPPING  12/13/2016  . IR EMBO TUMOR ORGAN ISCHEMIA INFARCT INC GUIDE ROADMAPPING  01/09/2017  . IR IVC FILTER PLMT / S&I /IMG GUID/MOD SED  04/24/2017  . IR RADIOLOGIST EVAL & MGMT  11/06/2016  . IR RADIOLOGIST EVAL & MGMT  01/02/2017  . IR RADIOLOGIST EVAL & MGMT  02/05/2017  . IR RADIOLOGIST EVAL & MGMT  05/02/2017  . IR RADIOLOGIST EVAL & MGMT  10/17/2017  . IR US GUIDE VASC ACCESS RIGHT  11/28/2016  . IR US GUIDE VASC ACCESS RIGHT  12/13/2016  . IR US GUIDE VASC ACCESS RIGHT  01/09/2017  . KNEE ARTHROSCOPY Right 11/14/2006  . KNEE ARTHROSCOPY Bilateral 5 and 6 years ago  . KNEE ARTHROSCOPY WITH LATERAL MENISECTOMY  07/03/2012   Procedure: KNEE ARTHROSCOPY WITH LATERAL MENISECTOMY;  Surgeon: Magnus Sinning, MD;  [facility];  Service: Orthopedics;  Laterality: Left;  with Partial Lateral Menisectomy and Medial Menisectomy. Shaving of medial and lateral femoral condyles. Shaving of patella. Removal of a loose body  . LOOP RECORDER REMOVAL N/A 05/22/2018   Procedure: LOOP RECORDER REMOVAL;  Surgeon: Thompson Grayer, MD;  Location: [place] CV [facility];  Service: Cardiovascular;  Laterality: N/A;  . PACEMAKER IMPLANT N/A 05/22/2018   MDT Azure XT DR MRI implanted by Dr Rayann Heman for sick sinus syndrome  . POLYPECTOMY  11/07/2019   Procedure: POLYPECTOMY;  Surgeon: Yetta Flock, MD;  [facility];  Service: Gastroenterology;;  . tibial and fibular internal fixation Left   . TOTAL ABDOMINAL HYSTERECTOMY  87 years old  . UPPER  GASTROINTESTINAL ENDOSCOPY  2009, 2013   HPI:  [name] is a 87 y.o. female with medical history significant for neuroendocrine tumor with metastasis to the liver, cerebellar degeneration, seizures, anxiety and depression, HTN.  Patient presented to the ED with complaints of generalized weakness, dry heaves, and leg swelling.  At the time of my evaluation patient is confused and unable to give me history.  Patient spouse is at bedside.  He reports that patient's confusion started today, also with dry heaving.  Reports good oral intake up until today.  She has been weak over the past few days.  Reports small amount of loose stools over the past several weeks.  Has chronic lower abdominal pain.  She has also been having headaches.  Reports patient has not slept in at least 40 hours. BSE requested.   Assessment / Plan /  Recommendation Clinical Impression  Clinical swallow evaluation completed, however Pt with limited participation. Her husband was at bedside and reported that up until a few days ago, Pt had been eating well. She currently is alert, but appears to be in pain and fearful (eyes darting, moaning, restless). Pt with limited participation during oral motor examination, however no gross asymmetry appreciated. Pt with xerostomia. She was agreeable (via head nod) for ice chips, but expectorated after manipulating for just a few seconds. No attempt made to move bolus posteriorly. SLP occluded straw and provided a few drops into oral cavity, however Pt then expectorated. She was presented with straw (as husband says she uses at home) and did not attempt to suck from the straw. Pt refused other po. Recommend oral care, ice chips, and small sips of water if Pt requests; can try po medications crushed in puree, however suspect Pt will refuse/expectorate at this time. Palliative care team is scheduled to meet with Pt/family later today. Mr. Fickett stated that Pt would not want to be artificially fed, no  CPR. SLP will continue to follow. SLP Visit Diagnosis: Dysphagia, unspecified (R13.10)    Aspiration Risk  Mild aspiration risk;Risk for inadequate nutrition/hydration    Diet Recommendation Ice chips PRN after oral care;NPO except meds;Free water protocol after oral care   Medication Administration: Whole meds with puree Supervision: Staff to assist with self feeding;Full supervision/cueing for compensatory strategies Compensations: Small sips/bites;Slow rate Postural Changes: Seated upright at 90 degrees;Remain upright for at least 30 minutes after po intake    Other  Recommendations Oral Care Recommendations: Oral care prior to ice chip/H20;Oral care before and after PO;Staff/trained caregiver to provide oral care Other Recommendations: Clarify dietary restrictions   Follow up Recommendations 24 hour supervision/assistance      Frequency and Duration min 2x/week  1 week       Prognosis Prognosis for Safe Diet Advancement: Guarded Barriers to Reach Goals: Behavior      Swallow Study   General Date of Onset: 09/04/20 HPI: [name] is a 87 y.o. female with medical history significant for neuroendocrine tumor with metastasis to the liver, cerebellar degeneration, seizures, anxiety and depression, HTN.  Patient presented to the ED with complaints of generalized weakness, dry heaves, and leg swelling.  At the time of my evaluation patient is confused and unable to give me history.  Patient spouse is at bedside.  He reports that patient's confusion started today, also with dry heaving.  Reports good oral intake up until today.  She has been weak over the past few days.  Reports small amount of loose stools over the past several weeks.  Has chronic lower abdominal pain.  She has also been having headaches.  Reports patient has not slept in at least 40 hours. BSE requested. Type of Study: Bedside Swallow Evaluation Diet Prior to this Study: Dysphagia 1 (puree);Nectar-thick  liquids Temperature Spikes Noted: No Respiratory Status: Room air History of Recent Intubation: No Behavior/Cognition: Alert;Requires cueing;Agitated Oral Cavity Assessment: Dry Oral Care Completed by SLP: Yes Oral Cavity - Dentition: Missing dentition Self-Feeding Abilities: Total assist Patient Positioning: Upright in bed Baseline Vocal Quality: Normal (limited vocalizations) Volitional Cough: Cognitively unable to elicit Volitional Swallow: Unable to elicit    Oral/Motor/Sensory Function Overall Oral Motor/Sensory Function:  (Pt with limited participation for evaluation)   Ice Chips Ice chips: Impaired Presentation: Spoon Oral Phase Impairments:  (Pt manipulated briefly and expectorated) Pharyngeal Phase Impairments:  (no swallow triggered)   Thin Liquid Thin  Liquid: Impaired Presentation: Straw;Spoon Oral Phase Impairments:  (Pt expectorated) Oral Phase Functional Implications: Oral holding Pharyngeal  Phase Impairments: Unable to trigger swallow    Nectar Thick Nectar Thick Liquid:  (Pt expectorated)   Honey Thick Honey Thick Liquid: Not tested   Puree Puree: Not tested   Solid     Solid: Not tested     Thank you,  Genene Churn, [place]  Darius Fillingim 09/06/2020,1:17 PM

## 2020-09-06 NOTE — Progress Notes (Signed)
Remote pacemaker transmission.   

## 2020-09-06 NOTE — Progress Notes (Signed)
PROGRESS NOTE   [name]  GEX:[id number] DOB: 1934-02-02 DOA: 09/04/2020 PCP: [name], MD   Chief Complaint  Patient presents with  . Leg Swelling  . Nausea   Level of care: Med-Surg  Brief Admission History:  87 y.o. female with medical history significant for neuroendocrine tumor with metastasis to the liver, cerebellar degeneration, seizures, anxiety and depression, HTN.  Patient presented to the ED with complaints of generalized weakness, dry heaves, and leg swelling.  At the time of my evaluation patient is confused and unable to give me history.  Patient spouse is at bedside.  He reports that patient's confusion started today, also with dry heaving.  Reports good oral intake up until today.  She has been weak over the past few days.  Reports small amount of loose stools over the past several weeks.  Has chronic lower abdominal pain.  She has also been having headaches.  Reports patient has not slept in at least 40 hours.   Assessment & Plan:   Principal Problem:   Hyponatremia Active Problems:   Essential hypertension   Cerebellar degeneration ([name])   Depression   Paroxysmal atrial fibrillation ([name])   Neuroendocrine carcinoma metastatic to liver [facility])   Sick sinus syndrome ([place])   Palliative care by specialist  1. Metabolic encephalopathy - focus of care is full comfort care per family   2. Metabolic acidosis - full comfort care  3. Hyponatremia - improving with hydration.  Follow closely.   4. Metastatic neuroendocrine tumor - Pt no longer able to tolerate chemotherapy.  Palliative care consulted and now focus of care is full comfort measures.  5. Uncontrolled hypertension - full comfort measures.  6. PAF - s/p pacemaker, full comfort measures.  7. Depression/anxiety - full comfort measures.   DVT prophylaxis: xarelto  Code Status: DNR  Family Communication: spouse  Disposition: Plan is discharge home 2/9 afternoon with [facility] to continue full  comfort care measures Status is: Inpatient  Remains inpatient appropriate because:IV treatments appropriate due to intensity of illness or inability to take PO and Inpatient level of care appropriate due to severity of illness  Dispo: The patient is from: Home              Anticipated d/c is to: TBD              Anticipated d/c date is: 1 day              Patient currently is not medically stable to d/c.   Difficult to place patient No   Consultants:   Palliative care   Procedures:     Antimicrobials:     Subjective: Pt crying out in pain and discomfort.   Objective: Vitals:   09/05/20 2106 09/06/20 0458 09/06/20 0500 09/06/20 1103  BP: (!) 163/68 (!) 182/141 (!) 174/50 (!) 153/87  Pulse: 73 (!) 102  83  Resp: 20 18  17   Temp: 99 F (37.2 C) 98.6 F (37 C)  98.8 F (37.1 C)  TempSrc: Oral Oral    SpO2: 93% 92%  95%  Weight:      Height:        Intake/Output Summary (Last 24 hours) at 09/06/2020 1534 Last data filed at 09/06/2020 0700 Gross per 24 hour  Intake 775.88 ml  Output --  Net 775.88 ml   Filed Weights   09/04/20 1829  Weight: 90.9 kg    Examination:  General exam: frail elderly female, appears distressed. Uncomfortable.Marland Kitchen  Respiratory system: Clear to auscultation. Respiratory effort normal. Cardiovascular system: normal S1 & S2 heard. No JVD, murmurs, rubs, gallops or clicks. No pedal edema. Gastrointestinal system: Abdomen is nondistended, soft and nontender. No organomegaly or masses felt. Normal bowel sounds heard. Central nervous system: Alert. No focal neurological deficits. Extremities: Symmetric 5 x 5 power. Skin: No rashes, lesions or ulcers Psychiatry: Judgement and insight appear poor. Mood & affect appropriate.   Data Reviewed: I have personally reviewed following labs and imaging studies  CBC: Recent Labs  Lab 09/04/20 1847  WBC 7.3  HGB 14.5  HCT 41.8  MCV 92.9  PLT 053    Basic Metabolic Panel: Recent Labs  Lab  09/04/20 1847 09/05/20 0115 09/05/20 0641 09/06/20 0510  NA 119* 126* 126* 130*  K 3.5 4.1 4.3 4.0  CL 90* 99 100 102  CO2 18* 21* 17* 21*  GLUCOSE 144* 128* 113* 120*  BUN 11 13 15 22   CREATININE 0.60 0.67 0.63 0.69  CALCIUM 9.3 9.1 9.0 9.2  MG 1.8  --   --   --     GFR: Estimated Creatinine Clearance: 57.2 mL/min (by C-G formula based on SCr of 0.69 mg/dL).  Liver Function Tests: Recent Labs  Lab 09/04/20 1847  AST 36  ALT 21  ALKPHOS 82  BILITOT 1.1  PROT 7.3  ALBUMIN 4.1    CBG: No results for input(s): GLUCAP in the last 168 hours.  Recent Results (from the past 240 hour(s))  SARS CORONAVIRUS 2 (TAT 6-24 HRS) Nasopharyngeal Nasopharyngeal Swab     Status: None   Collection Time: 09/04/20  9:25 PM   Specimen: Nasopharyngeal Swab  Result Value Ref Range Status   SARS Coronavirus 2 NEGATIVE NEGATIVE Final    Comment: (NOTE) SARS-CoV-2 target nucleic acids are NOT DETECTED.  The SARS-CoV-2 RNA is generally detectable in upper and lower respiratory specimens during the acute phase of infection. Negative results do not preclude SARS-CoV-2 infection, do not rule out co-infections with other pathogens, and should not be used as the sole basis for treatment or other patient management decisions. Negative results must be combined with clinical observations, patient history, and epidemiological information. The expected result is Negative.  Fact Sheet for Patients: SugarRoll.be  Fact Sheet for Healthcare Providers: https://www.woods-mathews.com/  This test is not yet approved or cleared by the [place] FDA and  has been authorized for detection and/or diagnosis of SARS-CoV-2 by FDA under an Emergency Use Authorization (EUA). This EUA will remain  in effect (meaning this test can be used) for the duration of the COVID-19 declaration under Se ction 564(b)(1) of the Act, 21 U.S.C. section 360bbb-3(b)(1), unless the  authorization is terminated or revoked sooner.  Performed at [facility], [place] [address] [place], [place] [address]      Radiology Studies: CT HEAD WO CONTRAST  Result Date: 09/04/2020 CLINICAL DATA:  Altered level of consciousness, stage IV metastatic lung cancer EXAM: CT HEAD WITHOUT CONTRAST TECHNIQUE: Contiguous axial images were obtained from the base of the skull through the vertex without intravenous contrast. COMPARISON:  07/19/2020 FINDINGS: Brain: Chronic ischemic changes are seen throughout the periventricular white matter and cerebellum. No evidence of acute infarct or hemorrhage. Lateral ventricles and midline structures are stable. No acute extra-axial fluid collections. No mass effect. Stable meningioma along the right tentorium, measuring up to 1.4 cm. Vascular: No hyperdense vessel or unexpected calcification. Skull: Normal. Negative for fracture or focal lesion. Sinuses/Orbits: No acute finding. Other: None. IMPRESSION:  1. Stable chronic ischemic changes.  No acute intracranial process. 2. Stable right tentorial meningioma. Electronically Signed   By: Randa Ngo M.D.   On: 09/04/2020 22:35   Scheduled Meds: . HYDROcodone-acetaminophen  1 tablet Oral Once  . LORazepam  1 mg Intravenous TID  . polyvinyl alcohol  1 drop Both Eyes Daily   Continuous Infusions:   LOS: 2 days   Time spent: 35 minutes   Kaho Selle Wynetta Emery, MD How to contact the [facility] Attending or Consulting provider [place] or covering provider during after hours [place], for this patient?  1. Check the care team in [facility] and look for a) attending/consulting TRH provider listed and b) the [facility] team listed 2. Log into [email or web address] and use [place] universal password to access. If you do not have the password, please contact the hospital operator. 3. Locate the [facility] provider you are looking for under [facility] and page to a number that you can be directly reached. 4. If you still have difficulty  reaching the provider, please page the [facility] (Director on Call) for the Hospitalists listed on amion for assistance.  09/06/2020, 3:34 PM

## 2020-09-06 NOTE — TOC Progression Note (Signed)
Transition of Care [facility]) - Progression Note    Patient Details  Name: [name] MRN: [id number] Date of Birth: March 07, 1934  Transition of Care [facility]) CM/SW Contact  Shade Flood, LCSW Phone Number: 09/06/2020, 3:22 PM  Clinical Narrative:     TOC following. Per Palliative APNP, pt's husband would like to take pt home with Hospice care from [facility] Referred to Cassandra at [facility]. Anticipating dc home tomorrow. Will follow up in AM.  Expected Discharge Plan: Home w Hospice Care Barriers to Discharge: Continued Medical Work up  Expected Discharge Plan and Services Expected Discharge Plan: [place] In-house Referral: Clinical Social Work Discharge Planning Services: NA Post Acute Care Choice: NA Living arrangements for the past 2 months: Single Family Home                 DME Arranged: N/A DME Agency: NA       HH Arranged: NA HH Agency: NA         Social Determinants of Health (SDOH) Interventions    Readmission Risk Interventions Readmission Risk Prevention Plan 09/05/2020  Transportation Screening Complete  Home Care Screening Complete  Medication Review (RN CM) Complete  Some recent data might be hidden

## 2020-09-06 NOTE — Consult Note (Signed)
Consultation Note Date: 09/06/2020   Patient Name: [name]  DOB: March 03, 1934  MRN: [id number]  Age / Sex: 87 y.o., female  PCP: Rebekah Fraise, MD Referring Physician: Murlean Iba, MD  Reason for Consultation: Establishing goals of care and Psychosocial/spiritual support  HPI/Patient Profile: 87 y.o. female  with past medical history of  neuroendocrine tumor with metastasis to the liver, cerebellar degeneration, seizures, anxiety and depression, HTN admitted on 09/04/2020 with hyponatremia.   Clinical Assessment and Goals of Care: I have reviewed medical records including EPIC notes, labs and imaging, received report from attending and bedside nursing staff, examined the patient and met at bedside with husband of 26 years, [name], to discuss diagnosis prognosis, GOC, EOL wishes, disposition and options.   I introduced Palliative Medicine as specialized medical care for people living with serious illness. It focuses on providing relief from the symptoms and stress of a serious illness.   We discussed a brief life review of the patient.  Mr. and [name] have been married for 47 years.  [name] had 6 children, Ricky stepchildren, 5 are still living.  As far as functional and nutritional status, she has had a decline over the last 3 to 6 months in particular, but has needed assistance with ADLs for several years.  [name] that there has been a marked decline over the last few days in particular.  We discussed her current illness and what it means in the larger context of her on-going co-morbidities.  Natural disease trajectory and expectations at EOL were discussed.  The difference between aggressive medical intervention and comfort care was considered in light of the patient's goals of care.  At this point [name] states that [name] does not know where she is, she is not recovering.    Hospice  services outpatient were explained and offered.  [name] states that his wife sister had hospice care last year, residential hospice in [place].  He Paul that [name] always wanted to be at home, and he would like at home hospice services.  Provider choice offered.  They choose hospice of [facility].  We talked in detail about what is and is not offered with hospice care.  [name] states that because [name] is no longer eating and drinking he understands that it is no benefit to continue her medicines.  We talked about liquid by mouth medicines for pain, anxiety, breathlessness.  He is in agreement.  He agrees to unburden her for medications and treatments that are changing things.  [name] states that he has all needed equipment, but would like EMS transport home.  Requesting tomorrow after lunch if possible.  Questions and concerns were addressed.  The family was encouraged to call with questions or concerns.   Conference with attending, bedside nursing staff, transition of care team related to patient condition, needs, goals of care.   HCPOA   NEXT OF KIN -husband of 27 years, [name].  SUMMARY OF RECOMMENDATIONS   Comfort and dignity at end-of-life,  at home hospice care/comfort care [facility] provider of choice   Code Status/Advance Care Planning:  DNR  Symptom Management:   End-of-life order set implemented  Palliative Prophylaxis:   Frequent Pain Assessment and Palliative Wound Care  Additional Recommendations (Limitations, Scope, Preferences):  Full Comfort Care  Psycho-social/Spiritual:   Desire for further Chaplaincy support:no  Additional Recommendations: Caregiving  Support/Resources and Education on Hospice  Prognosis:   < 2 weeks, prognosis discussed with permission.  [name] is no longer eating and drinking.  Without artificial hydration she is expected to have days.  Discharge Planning: Home with the benefit of  [facility] for comfort and dignity at end-of-life.      Primary Diagnoses: Present on Admission: . Hyponatremia . Essential hypertension . Depression . Paroxysmal atrial fibrillation ([name]) . Neuroendocrine carcinoma metastatic to liver ([place]) . Sick sinus syndrome ([place])   I have reviewed the medical record, interviewed the patient and family, and examined the patient. The following aspects are pertinent.  Past Medical History:  Diagnosis Date  . Acute respiratory failure with hypoxia ([place]) 10/23/2015  . Allergic rhinitis    PT. DENIES  . Anxiety   . Aortic insufficiency    Echo 04/29/2018: EF 65-70, mild AS (mean 13), mod AI, Asc Aorta 42 mm (mildly dilated), mild LAE, PASP 41, pericardium normal in appearance.   . Arthritis    NECK  . Ataxia   . Back pain 12/13/2016  . Bradycardia    primarily nocturnal  . Burning tongue syndrome 25 years  . Cataract   . Cerebellar degeneration ([place])   . Chest pain 12/13/2016   Atypical chest pain  . Chronic urinary tract infection   . Complication of anesthesia    low o2 sats, coded 30 years ago  . CVA (cerebral infarction) 05/2003  . Dehydration with hyponatremia 03/23/2018  . Depression   . DVT (deep venous thrombosis) ([place])   . Encounter for antineoplastic chemotherapy 10/09/2016  . Fatigue 01/03/2015  . Gait disorder   . Gastric polyps   . GERD (gastroesophageal reflux disease)   . Goals of care, counseling/discussion 10/09/2016  . H/O: CVA (cerebrovascular accident) 07/14/2016  . High cholesterol   . History of pericarditis   . Hyperlipidemia   . Hypotension   . Hypothyroidism   . IBS (irritable bowel syndrome)   . Liver cancer ([place]) dx'd 06/2016   liver  . Lung cancer ([place])   . Neuroendocrine cancer ([place]) 08/23/2016  . Obesity   . Paroxysmal atrial fibrillation ([name])    chads2vasc score is 6,  she is felt to be a poor candidate for anticoagulation  . Pericarditis   . Personal history of arterial venous malformation  (AVM)    right side of face  . Pulmonary embolism ([place])   . Seizure disorder ([place])   . Seizures ([place]) 2003   " smelling"- Gabapentin "no problem"  . Shortness of breath dyspnea    with exertion  . Sick sinus syndrome ([place])   . Sternum fx 10/27/2013  . Stroke [facility]) 5 years ago   Right side of face weak, slurred speach-   . Thyroid disease   . TIA (transient ischemic attack)   . UTI (lower urinary tract infection) 03/27/2016   "frequently"   Social History   Socioeconomic History  . Marital status: Married    Spouse name: Eara Burruel  . Number of children: 6  . Years of education: 53  . Highest education level: 10th grade  Occupational History  . Occupation: retired  Tobacco Use  . Smoking status: Former Smoker    Packs/day: 0.25    Years: 10.00    Pack years: 2.50    Types: Cigarettes    Quit date: 07/31/1975    Years since quitting: 45.1  . Smokeless tobacco: Never Used  Vaping Use  . Vaping Use: Never used  Substance and Sexual Activity  . Alcohol use: No  . Drug use: No  . Sexual activity: Not Currently  Other Topics Concern  . Not on file  Social History Narrative   Lives with husband, does have stairs, does not use them. Pt completed 10th grade.   Right handed   Social Determinants of Health   Financial Resource Strain: Low Risk   . Difficulty of Paying Living Expenses: Not hard at all  Food Insecurity: No Food Insecurity  . Worried About Charity fundraiser in the Last Year: Never true  . Ran Out of Food in the Last Year: Never true  Transportation Needs: No Transportation Needs  . Lack of Transportation (Medical): No  . Lack of Transportation (Non-Medical): No  Physical Activity: Inactive  . Days of Exercise per Week: 0 days  . Minutes of Exercise per Session: 0 min  Stress: No Stress Concern Present  . Feeling of Stress : Only a little  Social Connections: Moderately Isolated  . Frequency of Communication with Friends and Family: More than three  times a week  . Frequency of Social Gatherings with Friends and Family: More than three times a week  . Attends Religious Services: Never  . Active Member of Clubs or Organizations: No  . Attends Archivist Meetings: Never  . Marital Status: Married   Family History  Problem Relation Age of Onset  . Heart attack Father 21       fatal  . Peptic Ulcer Mother   . Coronary artery disease Brother   . Diabetes Brother   . Colon cancer Brother 56  . Colon polyps Brother   . Prostate cancer Brother   . Diabetes Brother   . Prostate cancer Son   . COPD Daughter    Scheduled Meds: . amLODipine  10 mg Oral Daily  . Chlorhexidine Gluconate Cloth  6 each Topical Daily  . escitalopram  20 mg Oral QHS  . HYDROcodone-acetaminophen  1 tablet Oral Once  . levothyroxine  75 mcg Oral QAC breakfast  . OXcarbazepine  150 mg Oral Daily  . Oxcarbazepine  300 mg Oral QHS  . pantoprazole  40 mg Oral BID  . polyvinyl alcohol  1 drop Both Eyes Daily  . pravastatin  40 mg Oral q1800  . QUEtiapine  300 mg Oral QHS  . rivaroxaban  20 mg Oral Q supper   Continuous Infusions: . sodium chloride 50 mL/hr at 09/05/20 1850  . cefTRIAXone (ROCEPHIN)  IV 1 g (09/06/20 0822)   PRN Meds:.acetaminophen **OR** acetaminophen, hydrALAZINE, HYDROmorphone (DILAUDID) injection, ondansetron **OR** ondansetron (ZOFRAN) IV Medications Prior to Admission:  Prior to Admission medications   Medication Sig Start Date End Date Taking? Authorizing Provider  ALPRAZolam (XANAX) 0.25 MG tablet TAKE 1 TABLET BY MOUTH AT BEDTIME AS NEEDED FOR ANXIETY Patient taking differently: Take 0.25 mg by mouth at bedtime as needed for anxiety. 05/11/20  Yes Curt Bears, MD  amLODipine (NORVASC) 10 MG tablet Take 1 tablet (10 mg total) by mouth daily. 07/08/20  Yes Stacks, Cletus Gash, MD  escitalopram (LEXAPRO) 20 MG tablet TAKE 1  TABLET AT BEDTIME Patient taking differently: Take 20 mg by mouth at bedtime. 06/15/20  Yes Stacks,  Broadus John, MD  FeFum-FePoly-FA-B Cmp-C-Biot (INTEGRA PLUS) CAPS Take 1 capsule by mouth every morning. 08/11/20  Yes Si Gaul, MD  fluticasone (FLONASE) 50 MCG/ACT nasal spray USE 2 SPRAYS IN EACH NOSTRIL AT BEDTIME Patient taking differently: Place 2 sprays into both nostrils daily. 06/06/20  Yes Stacks, Broadus John, MD  furosemide (LASIX) 20 MG tablet TAKE 1 TABLET DAILY AS NEEDED FOR FLUID RETENTION Patient taking differently: Take 20 mg by mouth daily as needed for fluid. 10/13/19  Yes Stacks, Broadus John, MD  gabapentin (NEURONTIN) 400 MG capsule 2 CAPSULES THREE TIMES A DAY Patient taking differently: Take 800 mg by mouth 3 (three) times daily. 06/06/20  Yes Mechele Claude, MD  HYDROcodone-acetaminophen (NORCO/VICODIN) 5-325 MG tablet Take 1 tablet by mouth every 6 (six) hours as needed for moderate pain. 07/08/20  Yes Stacks, Broadus John, MD  hydroxypropyl methylcellulose / hypromellose (ISOPTO TEARS / GONIOVISC) 2.5 % ophthalmic solution Place 1 drop into both eyes daily.    Yes [provider]  lovastatin (MEVACOR) 40 MG tablet TAKE 1 TABLET AT BEDTIME Patient taking differently: Take 40 mg by mouth at bedtime. 10/13/19  Yes Stacks, Broadus John, MD  Melatonin 3 MG TBDP Take 3-6 mg by mouth at bedtime as needed. Patient taking differently: Take 3-6 mg by mouth at bedtime as needed (sleep). 08/02/16  Yes Johna Sheriff, MD  ondansetron (ZOFRAN) 8 MG tablet TAKE 1 TABLET EVERY 8 HOURS AS NEEDED FOR NAUSEA OR VOMITING Patient taking differently: Take 8 mg by mouth every 8 (eight) hours as needed for nausea or vomiting. 06/06/20  Yes Stacks, Broadus John, MD  OXcarbazepine (TRILEPTAL) 150 MG tablet Take 1 tablet in morning and 2 tablets at bedtime. Patient taking differently: Take 150-300 mg by mouth See admin instructions. Take 1 tablet in morning and 2 tablets at bedtime. 07/26/20  Yes Jaffe, Adam R, DO  pantoprazole (PROTONIX) 40 MG tablet TAKE 1 TABLET TWICE A DAY Patient taking differently: Take 40 mg by  mouth 2 (two) times daily. 08/23/20  Yes Mechele Claude, MD  QUEtiapine (SEROQUEL) 300 MG tablet Take 1 tablet (300 mg total) by mouth at bedtime. 07/08/20  Yes Stacks, Broadus John, MD  silver sulfADIAZINE (SILVADENE) 1 % cream Apply 1 application topically daily. 06/17/19  Yes Stacks, Broadus John, MD  SYNTHROID 75 MCG tablet TAKE 1 TABLET DAILY BEFORE BREAKFAST Patient taking differently: Take 75 mcg by mouth daily before breakfast. 11/19/19  Yes Stacks, Broadus John, MD  XARELTO 20 MG TABS tablet TAKE 1 TABLET DAILY WITH SUPPER Patient taking differently: Take 20 mg by mouth daily with supper. 07/04/20  Yes Mechele Claude, MD   Allergies  Allergen Reactions  . Levaquin [Levofloxacin] Shortness Of Breath  . Lisinopril Cough   Review of Systems  Unable to perform ROS: Acuity of condition    Physical Exam Vitals and nursing note reviewed.     Vital Signs: BP (!) 153/87 (BP Location: Right Arm)   Pulse 83   Temp 98.8 F (37.1 C)   Resp 17   Ht 5\' 4"  (1.626 m)   Wt 90.9 kg   SpO2 95%   BMI 34.40 kg/m  Pain Scale: PAINAD POSS *See Group Information*: 1-Acceptable,Awake and alert Pain Score: 4    SpO2: SpO2: 95 % O2 Device:SpO2: 95 % O2 Flow Rate: .   IO: Intake/output summary:   Intake/Output Summary (Last 24 hours) at 09/06/2020 1400 Last data filed at 09/06/2020  0700 Gross per 24 hour  Intake 775.88 ml  Output --  Net 775.88 ml    LBM: Last BM Date:  (date unknown) Baseline Weight: Weight: 90.9 kg Most recent weight: Weight: 90.9 kg     Palliative Assessment/Data:   Flowsheet Rows   Flowsheet Row Most Recent Value  Intake Tab   Referral Department Hospitalist  Unit at Time of Referral Med/Surg Unit  Palliative Care Primary Diagnosis Cancer  Date Notified 09/05/20  Palliative Care Type New Palliative care  Reason for referral Clarify Goals of Care  Date of Admission 09/04/20  Date first seen by Palliative Care 09/06/20  # of days Palliative referral response time 1 Day(s)   # of days IP prior to Palliative referral 1  Clinical Assessment   Palliative Performance Scale Score 20%  Pain Max last 24 hours Not able to report  Pain Min Last 24 hours Not able to report  Dyspnea Max Last 24 Hours Not able to report  Dyspnea Min Last 24 hours Not able to report  Psychosocial & Spiritual Assessment   Palliative Care Outcomes       Time In: 1400 Time Out: 1510 Time Total: 70 minutes Greater than 50%  of this time was spent counseling and coordinating care related to the above assessment and plan.  Signed by: Drue Novel, NP   Please contact Palliative Medicine Team phone at [phone] for questions and concerns.  For individual provider: See Shea Evans

## 2020-09-06 NOTE — Progress Notes (Signed)
Patient offered breakfast and shakes her head in refusal. When food is brought to her mouth, she opens but immediately spits it out. No family at bedside at this time. MD aware. Will continue to monitor.

## 2020-09-07 DIAGNOSIS — E871 Hypo-osmolality and hyponatremia: Principal | ICD-10-CM

## 2020-09-07 MED ORDER — HYDROCODONE-ACETAMINOPHEN 5-325 MG PO TABS: 1.0000 | ORAL_TABLET | Freq: Four times a day (QID) | ORAL | 0 refills | Status: AC | PRN

## 2020-09-07 MED ORDER — LORAZEPAM 1 MG PO TABS: 1.0000 mg | ORAL_TABLET | ORAL | 0 refills | Status: AC | PRN

## 2020-09-07 NOTE — TOC Transition Note (Signed)
Transition of Care [facility]) - CM/SW Discharge Note   Patient Details  Name: [name] MRN: [id number] Date of Birth: 04-16-1934  [facility] CM/SW Contact:  Shade Flood, LCSW Phone Number: 09/07/2020, 11:19 AM   Clinical Narrative:     Pt stable for dc per MD. Plan remains for dc home with [facility] care at home. Updated Cassandra on pt's dc and Cassandra states they will see patient this afternoon.   EMS will transport.   There are no other TOC needs for dc.   Final next level of care: Home w Hospice Care Barriers to Discharge: Barriers Resolved   Patient Goals and CMS Choice Patient states their goals for this hospitalization and ongoing recovery are:: Return home CMS Medicare.gov Compare Post Acute Care list provided to:: Patient Represenative (must comment) Choice offered to / list presented to : Spouse  Discharge Placement                       Discharge Plan and Services In-house Referral: Clinical Social Work Discharge Planning Services: NA Post Acute Care Choice: NA          DME Arranged: N/A DME Agency: NA       HH Arranged: NA HH Agency: NA        Social Determinants of Health (SDOH) Interventions     Readmission Risk Interventions Readmission Risk Prevention Plan 09/05/2020  Transportation Screening Complete  Home Care Screening Complete  Medication Review (RN CM) Complete  Some recent data might be hidden

## 2020-09-07 NOTE — Discharge Summary (Signed)
Physician Discharge Summary  [name] YSA:[id number] DOB: 1933/12/29 DOA: 09/04/2020  PCP: [name], MD  Admit date: 09/04/2020  Discharge date: 09/07/2020  Admitted From:Home  Disposition:  Home with hospice  Recommendations for Outpatient Follow-up:  1. Follow up with home hospice agency  Home Health:None  Equipment/Devices:None  Discharge Condition:Stable  CODE STATUS: DNR  Diet recommendation: Heart Healthy  Brief/Interim Summary: 87 y.o.femalewith medical history significant forneuroendocrine tumor with metastasis to the liver, cerebellar degeneration, seizures, anxiety and depression,HTN.  Patient presented to the ED with complaints of generalized weakness, dry heaves, and leg swelling. At the time of my evaluation patient is confused and unable to give me history. Patient spouse is at bedside. He reports that patient's confusion started today,also with dry heaving. Reports good oral intake upuntil today. She has been weak over the past few days. Reports small amount of loose stools over the past several weeks.Has chronic lower abdominal pain. She has also been having headaches. Reports patient has not slept in at least 40 hours.   -Patient was admitted with metabolic encephalopathy in the setting of hyponatremia with metastatic neuroendocrine tumor.  She is no longer able to tolerate chemotherapy and has elected to have full comfort care during this hospitalization after further discussion with palliative care.  She is now in stable condition for discharge to home with home hospice services.  Her long-term prognosis is quite poor and life expectancy appears to be days to weeks.  Discharge Diagnoses:  Principal Problem:   Hyponatremia Active Problems:   Essential hypertension   Cerebellar degeneration ([name])   Depression   Paroxysmal atrial fibrillation ([name])   Generalized weakness   Neuroendocrine carcinoma metastatic to liver [facility])   Sick sinus  syndrome [facility])   Palliative care by specialist  Principal discharge diagnosis: Acute metabolic encephalopathy-multifactorial with hyponatremia in the setting of metastatic neuroendocrine tumor.  Discharge Instructions  Discharge Instructions    Diet - low sodium heart healthy   Complete by: As directed    Increase activity slowly   Complete by: As directed      Allergies as of 09/07/2020      Reactions   Levaquin [levofloxacin] Shortness Of Breath   Lisinopril Cough      Medication List    STOP taking these medications   ALPRAZolam 0.25 MG tablet Commonly known as: XANAX   amLODipine 10 MG tablet Commonly known as: NORVASC   escitalopram 20 MG tablet Commonly known as: LEXAPRO   fluticasone 50 MCG/ACT nasal spray Commonly known as: FLONASE   furosemide 20 MG tablet Commonly known as: LASIX   gabapentin 400 MG capsule Commonly known as: NEURONTIN   Integra Plus Caps   lovastatin 40 MG tablet Commonly known as: MEVACOR   Melatonin 3 MG Tbdp   ondansetron 8 MG tablet Commonly known as: ZOFRAN   OXcarbazepine 150 MG tablet Commonly known as: TRILEPTAL   pantoprazole 40 MG tablet Commonly known as: PROTONIX   Synthroid 75 MCG tablet Generic drug: levothyroxine   Xarelto 20 MG Tabs tablet Generic drug: rivaroxaban     TAKE these medications   HYDROcodone-acetaminophen 5-325 MG tablet Commonly known as: NORCO/VICODIN Take 1 tablet by mouth every 6 (six) hours as needed for moderate pain.   hydroxypropyl methylcellulose / hypromellose 2.5 % ophthalmic solution Commonly known as: ISOPTO TEARS / GONIOVISC Place 1 drop into both eyes daily.   LORazepam 1 MG tablet Commonly known as: ATIVAN Take 1 tablet (1 mg total) by mouth every 4 (  four) hours as needed for anxiety.   QUEtiapine 300 MG tablet Commonly known as: SEROQUEL Take 1 tablet (300 mg total) by mouth at bedtime.   silver sulfADIAZINE 1 % cream Commonly known as: Silvadene Apply 1  application topically daily.       Allergies  Allergen Reactions  . Levaquin [Levofloxacin] Shortness Of Breath  . Lisinopril Cough    Consultations:  Palliative care   Procedures/Studies: CT HEAD WO CONTRAST  Result Date: 09/04/2020 CLINICAL DATA:  Altered level of consciousness, stage IV metastatic lung cancer EXAM: CT HEAD WITHOUT CONTRAST TECHNIQUE: Contiguous axial images were obtained from the base of the skull through the vertex without intravenous contrast. COMPARISON:  07/19/2020 FINDINGS: Brain: Chronic ischemic changes are seen throughout the periventricular white matter and cerebellum. No evidence of acute infarct or hemorrhage. Lateral ventricles and midline structures are stable. No acute extra-axial fluid collections. No mass effect. Stable meningioma along the right tentorium, measuring up to 1.4 cm. Vascular: No hyperdense vessel or unexpected calcification. Skull: Normal. Negative for fracture or focal lesion. Sinuses/Orbits: No acute finding. Other: None. IMPRESSION: 1. Stable chronic ischemic changes.  No acute intracranial process. 2. Stable right tentorial meningioma. Electronically Signed   By: Randa Ngo M.D.   On: 09/04/2020 22:35   CT Chest W Contrast  Result Date: 08/09/2020 CLINICAL DATA:  Non-small cell lung cancer.  Restaging. EXAM: CT CHEST, ABDOMEN, AND PELVIS WITH CONTRAST TECHNIQUE: Multidetector CT imaging of the chest, abdomen and pelvis was performed following the standard protocol during bolus administration of intravenous contrast. CONTRAST:  193mL OMNIPAQUE IOHEXOL 300 MG/ML  SOLN COMPARISON:  05/09/2020 FINDINGS: CT CHEST FINDINGS Cardiovascular: Left chest wall pacer device is noted with leads in the right atrial appendage and right ventricle. Normal heart size. Aortic atherosclerosis. Coronary artery calcifications. Mediastinum/Nodes: Normal appearance of the thyroid gland. The trachea appears patent and is midline. Normal appearance of the  esophagus. No enlarged axillary, supraclavicular, mediastinal, or hilar lymph nodes. Lungs/Pleura: No pleural effusion. Index bandlike density within the anterior right upper lobe measures 2.3 x 1.1 cm, image 35/6. Unchanged from previous exam. Numerous milli metric lung nodules are noted throughout both lungs which appear similar to the previous exam. Musculoskeletal: Remote healed mid body of sternum fracture. No acute or suspicious bone lesions. CT ABDOMEN PELVIS FINDINGS Hepatobiliary: Multifocal liver metastases are again noted. Index lesion within segment 3 measures 4.3 x 5.2 cm, image 48/2. Previously 4.0 x 4.7 cm. Index lesion within segment 2 measures 2.3 cm, image 40/2. Unchanged. Index lesion within segment 8 measures 1.8 x 1.6 cm, image 37/2. Previously 1.5 x 1.2 cm. Index lesion within segment 7 measures 4.3 x 3.4 cm, image 37/2. Previously 3.9 by 3.3 cm. Small stones are again noted layering within the gallbladder. Mild central left intrahepatic ductal dilatation is unchanged. No common bile duct dilatation. Pancreas: Unremarkable. No pancreatic ductal dilatation or surrounding inflammatory changes. Spleen: Normal in size without focal abnormality. Adrenals/Urinary Tract: Normal adrenal glands. No kidney mass or hydronephrosis. Urinary bladder is unremarkable. Stomach/Bowel: Surgical clips are noted within the stomach. No bowel wall thickening, inflammation, or distension. Vascular/Lymphatic: Aortic atherosclerosis. IVC filter. No abdominopelvic adenopathy. Reproductive: Status post hysterectomy. No adnexal masses. Other: No free fluid or fluid collections Musculoskeletal: Degenerative disc disease noted within the thoracolumbar spine. No acute or suspicious bone lesions identified. IMPRESSION: 1. Mild progression multifocal liver metastases. 2. Numerous subcentimeter lung nodules are noted throughout both lungs which appear similar to previous exam. 3. Aortic atherosclerosis and coronary  artery  calcifications. 4. Gallstones. Aortic Atherosclerosis (ICD10-I70.0). Electronically Signed   By: Kerby Moors M.D.   On: 08/09/2020 14:24   CT Abdomen Pelvis W Contrast  Result Date: 08/09/2020 CLINICAL DATA:  Non-small cell lung cancer.  Restaging. EXAM: CT CHEST, ABDOMEN, AND PELVIS WITH CONTRAST TECHNIQUE: Multidetector CT imaging of the chest, abdomen and pelvis was performed following the standard protocol during bolus administration of intravenous contrast. CONTRAST:  143mL OMNIPAQUE IOHEXOL 300 MG/ML  SOLN COMPARISON:  05/09/2020 FINDINGS: CT CHEST FINDINGS Cardiovascular: Left chest wall pacer device is noted with leads in the right atrial appendage and right ventricle. Normal heart size. Aortic atherosclerosis. Coronary artery calcifications. Mediastinum/Nodes: Normal appearance of the thyroid gland. The trachea appears patent and is midline. Normal appearance of the esophagus. No enlarged axillary, supraclavicular, mediastinal, or hilar lymph nodes. Lungs/Pleura: No pleural effusion. Index bandlike density within the anterior right upper lobe measures 2.3 x 1.1 cm, image 35/6. Unchanged from previous exam. Numerous milli metric lung nodules are noted throughout both lungs which appear similar to the previous exam. Musculoskeletal: Remote healed mid body of sternum fracture. No acute or suspicious bone lesions. CT ABDOMEN PELVIS FINDINGS Hepatobiliary: Multifocal liver metastases are again noted. Index lesion within segment 3 measures 4.3 x 5.2 cm, image 48/2. Previously 4.0 x 4.7 cm. Index lesion within segment 2 measures 2.3 cm, image 40/2. Unchanged. Index lesion within segment 8 measures 1.8 x 1.6 cm, image 37/2. Previously 1.5 x 1.2 cm. Index lesion within segment 7 measures 4.3 x 3.4 cm, image 37/2. Previously 3.9 by 3.3 cm. Small stones are again noted layering within the gallbladder. Mild central left intrahepatic ductal dilatation is unchanged. No common bile duct dilatation. Pancreas:  Unremarkable. No pancreatic ductal dilatation or surrounding inflammatory changes. Spleen: Normal in size without focal abnormality. Adrenals/Urinary Tract: Normal adrenal glands. No kidney mass or hydronephrosis. Urinary bladder is unremarkable. Stomach/Bowel: Surgical clips are noted within the stomach. No bowel wall thickening, inflammation, or distension. Vascular/Lymphatic: Aortic atherosclerosis. IVC filter. No abdominopelvic adenopathy. Reproductive: Status post hysterectomy. No adnexal masses. Other: No free fluid or fluid collections Musculoskeletal: Degenerative disc disease noted within the thoracolumbar spine. No acute or suspicious bone lesions identified. IMPRESSION: 1. Mild progression multifocal liver metastases. 2. Numerous subcentimeter lung nodules are noted throughout both lungs which appear similar to previous exam. 3. Aortic atherosclerosis and coronary artery calcifications. 4. Gallstones. Aortic Atherosclerosis (ICD10-I70.0). Electronically Signed   By: Kerby Moors M.D.   On: 08/09/2020 14:24   CUP PACEART REMOTE DEVICE CHECK  Result Date: 08/29/2020 Scheduled remote reviewed. Normal device function.  1 AF episode, < 1 minute Next remote 91 days.     Discharge Exam: Vitals:   09/06/20 1103 09/06/20 2025  BP: (!) 153/87 (!) 189/73  Pulse: 83 71  Resp: 17   Temp: 98.8 F (37.1 C) 98.4 F (36.9 C)  SpO2: 95% 95%   Vitals:   09/06/20 0458 09/06/20 0500 09/06/20 1103 09/06/20 2025  BP: (!) 182/141 (!) 174/50 (!) 153/87 (!) 189/73  Pulse: (!) 102  83 71  Resp: 18  17   Temp: 98.6 F (37 C)  98.8 F (37.1 C) 98.4 F (36.9 C)  TempSrc: Oral   Oral  SpO2: 92%  95% 95%  Weight:      Height:        General: Pt is alert, awake, not in acute distress Cardiovascular: RRR, S1/S2 +, no rubs, no gallops Respiratory: CTA bilaterally, no wheezing, no rhonchi Abdominal: Soft, NT,  ND, bowel sounds + Extremities: no edema, no cyanosis    The results of significant  diagnostics from this hospitalization (including imaging, microbiology, ancillary and laboratory) are listed below for reference.     Microbiology: Recent Results (from the past 240 hour(s))  SARS CORONAVIRUS 2 (TAT 6-24 HRS) Nasopharyngeal Nasopharyngeal Swab     Status: None   Collection Time: 09/04/20  9:25 PM   Specimen: Nasopharyngeal Swab  Result Value Ref Range Status   SARS Coronavirus 2 NEGATIVE NEGATIVE Final    Comment: (NOTE) SARS-CoV-2 target nucleic acids are NOT DETECTED.  The SARS-CoV-2 RNA is generally detectable in upper and lower respiratory specimens during the acute phase of infection. Negative results do not preclude SARS-CoV-2 infection, do not rule out co-infections with other pathogens, and should not be used as the sole basis for treatment or other patient management decisions. Negative results must be combined with clinical observations, patient history, and epidemiological information. The expected result is Negative.  Fact Sheet for Patients: SugarRoll.be  Fact Sheet for Healthcare Providers: https://www.woods-mathews.com/  This test is not yet approved or cleared by the [place] FDA and  has been authorized for detection and/or diagnosis of SARS-CoV-2 by FDA under an Emergency Use Authorization (EUA). This EUA will remain  in effect (meaning this test can be used) for the duration of the COVID-19 declaration under Se ction 564(b)(1) of the Act, 21 U.S.C. section 360bbb-3(b)(1), unless the authorization is terminated or revoked sooner.  Performed at [facility], [place] [address] [place], [place] [address]      Labs: BNP (last 3 results) No results for input(s): BNP in the last 8760 hours. Basic Metabolic Panel: Recent Labs  Lab 09/04/20 1847 09/05/20 0115 09/05/20 0641 09/06/20 0510  NA 119* 126* 126* 130*  K 3.5 4.1 4.3 4.0  CL 90* 99 100 102  CO2 18* 21* 17* 21*  GLUCOSE 144* 128*  113* 120*  BUN 11 13 15 22   CREATININE 0.60 0.67 0.63 0.69  CALCIUM 9.3 9.1 9.0 9.2  MG 1.8  --   --   --    Liver Function Tests: Recent Labs  Lab 09/04/20 1847  AST 36  ALT 21  ALKPHOS 82  BILITOT 1.1  PROT 7.3  ALBUMIN 4.1   No results for input(s): LIPASE, AMYLASE in the last 168 hours. No results for input(s): AMMONIA in the last 168 hours. CBC: Recent Labs  Lab 09/04/20 1847  WBC 7.3  HGB 14.5  HCT 41.8  MCV 92.9  PLT 153   Cardiac Enzymes: No results for input(s): CKTOTAL, CKMB, CKMBINDEX, TROPONINI in the last 168 hours. BNP: Invalid input(s): POCBNP CBG: No results for input(s): GLUCAP in the last 168 hours. D-Dimer No results for input(s): DDIMER in the last 72 hours. Hgb A1c No results for input(s): HGBA1C in the last 72 hours. Lipid Profile No results for input(s): CHOL, HDL, LDLCALC, TRIG, CHOLHDL, LDLDIRECT in the last 72 hours. Thyroid function studies Recent Labs    09/04/20 1847  TSH 6.298*   Anemia work up No results for input(s): VITAMINB12, FOLATE, FERRITIN, TIBC, IRON, RETICCTPCT in the last 72 hours. Urinalysis    Component Value Date/Time   COLORURINE AMBER (A) 09/05/2020 0050   APPEARANCEUR CLOUDY (A) 09/05/2020 0050   APPEARANCEUR Cloudy (A) 04/14/2019 1555   LABSPEC 1.031 (H) 09/05/2020 0050   PHURINE 6.0 09/05/2020 0050   GLUCOSEU NEGATIVE 09/05/2020 0050   HGBUR NEGATIVE 09/05/2020 0050   BILIRUBINUR NEGATIVE 09/05/2020 0050  BILIRUBINUR Negative 04/14/2019 1555   KETONESUR 20 (A) 09/05/2020 0050   PROTEINUR >=300 (A) 09/05/2020 0050   UROBILINOGEN negative 05/24/2015 1434   NITRITE NEGATIVE 09/05/2020 0050   LEUKOCYTESUR NEGATIVE 09/05/2020 0050   Sepsis Labs Invalid input(s): PROCALCITONIN,  WBC,  LACTICIDVEN Microbiology Recent Results (from the past 240 hour(s))  SARS CORONAVIRUS 2 (TAT 6-24 HRS) Nasopharyngeal Nasopharyngeal Swab     Status: None   Collection Time: 09/04/20  9:25 PM   Specimen: Nasopharyngeal  Swab  Result Value Ref Range Status   SARS Coronavirus 2 NEGATIVE NEGATIVE Final    Comment: (NOTE) SARS-CoV-2 target nucleic acids are NOT DETECTED.  The SARS-CoV-2 RNA is generally detectable in upper and lower respiratory specimens during the acute phase of infection. Negative results do not preclude SARS-CoV-2 infection, do not rule out co-infections with other pathogens, and should not be used as the sole basis for treatment or other patient management decisions. Negative results must be combined with clinical observations, patient history, and epidemiological information. The expected result is Negative.  Fact Sheet for Patients: SugarRoll.be  Fact Sheet for Healthcare Providers: https://www.woods-mathews.com/  This test is not yet approved or cleared by the [place] FDA and  has been authorized for detection and/or diagnosis of SARS-CoV-2 by FDA under an Emergency Use Authorization (EUA). This EUA will remain  in effect (meaning this test can be used) for the duration of the COVID-19 declaration under Se ction 564(b)(1) of the Act, 21 U.S.C. section 360bbb-3(b)(1), unless the authorization is terminated or revoked sooner.  Performed at [facility], [place] [address], [place], [place] [address]      Time coordinating discharge: 35 minutes  SIGNED:   Rodena Goldmann, DO [facility] 09/07/2020, 10:47 AM  If 7PM-7AM, please contact night-coverage [email or web address]

## 2020-09-07 NOTE — Care Management Important Message (Signed)
Important Message  Patient Details  Name: [name] MRN: [id number] Date of Birth: October 19, 1933   Medicare Important Message Given:  Yes     Tommy Medal 09/07/2020, 11:39 AM

## 2020-09-09 ENCOUNTER — Telehealth: Payer: Self-pay

## 2020-09-09 NOTE — Telephone Encounter (Signed)
Pt husband asked me to cancel all upcoming remotes because the hospice nurse states the patient has less than 4 hours. She is actively dying. I cancel the remotes, marked her Inactive in paceart. I also ordered her a return kit for her home monitor.

## 2020-09-27 DEATH — deceased

## 2020-11-07 ENCOUNTER — Other Ambulatory Visit: Payer: Medicare Other

## 2020-11-08 ENCOUNTER — Ambulatory Visit: Payer: Medicare Other | Admitting: Internal Medicine

## 2021-01-26 ENCOUNTER — Ambulatory Visit: Payer: Medicare Other | Admitting: Neurology
